# Patient Record
Sex: Female | Born: 1935 | Race: White | Hispanic: No | Marital: Married | Smoking: Former smoker
Health system: Southern US, Community
[De-identification: ages and names within clinical notes are randomized; demographics above are authoritative.]

## PROBLEM LIST (undated history)

## (undated) DIAGNOSIS — Z8679 Personal history of other diseases of the circulatory system: Secondary | ICD-10-CM

## (undated) DIAGNOSIS — I714 Abdominal aortic aneurysm, without rupture, unspecified: Secondary | ICD-10-CM

## (undated) DIAGNOSIS — R001 Bradycardia, unspecified: Secondary | ICD-10-CM

## (undated) DIAGNOSIS — F419 Anxiety disorder, unspecified: Secondary | ICD-10-CM

## (undated) DIAGNOSIS — H3530 Unspecified macular degeneration: Secondary | ICD-10-CM

## (undated) DIAGNOSIS — F0390 Unspecified dementia without behavioral disturbance: Secondary | ICD-10-CM

## (undated) DIAGNOSIS — K118 Other diseases of salivary glands: Secondary | ICD-10-CM

## (undated) DIAGNOSIS — G5139 Clonic hemifacial spasm, unspecified: Secondary | ICD-10-CM

## (undated) DIAGNOSIS — Z9889 Other specified postprocedural states: Secondary | ICD-10-CM

## (undated) DIAGNOSIS — R55 Syncope and collapse: Secondary | ICD-10-CM

## (undated) DIAGNOSIS — H548 Legal blindness, as defined in USA: Secondary | ICD-10-CM

## (undated) DIAGNOSIS — M47817 Spondylosis without myelopathy or radiculopathy, lumbosacral region: Secondary | ICD-10-CM

## (undated) DIAGNOSIS — E119 Type 2 diabetes mellitus without complications: Secondary | ICD-10-CM

## (undated) DIAGNOSIS — F32A Depression, unspecified: Secondary | ICD-10-CM

## (undated) DIAGNOSIS — I509 Heart failure, unspecified: Secondary | ICD-10-CM

## (undated) DIAGNOSIS — E039 Hypothyroidism, unspecified: Secondary | ICD-10-CM

## (undated) DIAGNOSIS — I82409 Acute embolism and thrombosis of unspecified deep veins of unspecified lower extremity: Secondary | ICD-10-CM

## (undated) DIAGNOSIS — I10 Essential (primary) hypertension: Secondary | ICD-10-CM

## (undated) DIAGNOSIS — R9430 Abnormal result of cardiovascular function study, unspecified: Secondary | ICD-10-CM

## (undated) DIAGNOSIS — F329 Major depressive disorder, single episode, unspecified: Secondary | ICD-10-CM

## (undated) DIAGNOSIS — F959 Tic disorder, unspecified: Secondary | ICD-10-CM

## (undated) DIAGNOSIS — IMO0002 Reserved for concepts with insufficient information to code with codable children: Secondary | ICD-10-CM

## (undated) DIAGNOSIS — K219 Gastro-esophageal reflux disease without esophagitis: Secondary | ICD-10-CM

## (undated) DIAGNOSIS — Z91041 Radiographic dye allergy status: Secondary | ICD-10-CM

## (undated) DIAGNOSIS — J449 Chronic obstructive pulmonary disease, unspecified: Secondary | ICD-10-CM

## (undated) HISTORY — PX: OOPHORECTOMY: SHX86

## (undated) HISTORY — DX: Personal history of other diseases of the circulatory system: Z86.79

## (undated) HISTORY — DX: Heart failure, unspecified: I50.9

## (undated) HISTORY — PX: BLADDER REPAIR: SHX76

## (undated) HISTORY — PX: KNEE CARTILAGE SURGERY: SHX688

## (undated) HISTORY — PX: CHOLECYSTECTOMY: SHX55

## (undated) HISTORY — DX: Type 2 diabetes mellitus without complications: E11.9

## (undated) HISTORY — PX: CATARACT EXTRACTION: SUR2

## (undated) HISTORY — DX: Other specified postprocedural states: Z98.890

## (undated) HISTORY — DX: Spondylosis without myelopathy or radiculopathy, lumbosacral region: M47.817

## (undated) HISTORY — DX: Legal blindness, as defined in USA: H54.8

## (undated) HISTORY — PX: APPENDECTOMY: SHX54

## (undated) HISTORY — DX: Bradycardia, unspecified: R00.1

## (undated) HISTORY — DX: Clonic hemifacial spasm, unspecified: G51.39

## (undated) HISTORY — PX: SINUS SURGERY WITH INSTATRAK: SHX5215

## (undated) HISTORY — DX: Unspecified macular degeneration: H35.30

---

## 2000-08-06 ENCOUNTER — Encounter: Admission: RE | Admit: 2000-08-06 | Discharge: 2000-08-06 | Payer: Self-pay | Admitting: Endocrinology

## 2000-08-06 ENCOUNTER — Encounter: Payer: Self-pay | Admitting: Endocrinology

## 2000-08-14 ENCOUNTER — Encounter: Admission: RE | Admit: 2000-08-14 | Discharge: 2000-08-14 | Payer: Self-pay | Admitting: Endocrinology

## 2000-08-14 ENCOUNTER — Encounter: Payer: Self-pay | Admitting: Endocrinology

## 2001-07-09 ENCOUNTER — Other Ambulatory Visit: Admission: RE | Admit: 2001-07-09 | Discharge: 2001-07-09 | Payer: Self-pay | Admitting: Endocrinology

## 2001-07-27 ENCOUNTER — Encounter: Admission: RE | Admit: 2001-07-27 | Discharge: 2001-07-27 | Payer: Self-pay | Admitting: Endocrinology

## 2001-07-27 ENCOUNTER — Encounter: Payer: Self-pay | Admitting: Endocrinology

## 2001-08-04 ENCOUNTER — Encounter (INDEPENDENT_AMBULATORY_CARE_PROVIDER_SITE_OTHER): Payer: Self-pay | Admitting: Specialist

## 2001-08-04 ENCOUNTER — Ambulatory Visit (HOSPITAL_COMMUNITY): Admission: RE | Admit: 2001-08-04 | Discharge: 2001-08-04 | Payer: Self-pay | Admitting: *Deleted

## 2002-07-19 ENCOUNTER — Encounter: Payer: Self-pay | Admitting: Endocrinology

## 2002-07-19 ENCOUNTER — Encounter: Admission: RE | Admit: 2002-07-19 | Discharge: 2002-07-19 | Payer: Self-pay | Admitting: Endocrinology

## 2002-10-12 ENCOUNTER — Other Ambulatory Visit: Admission: RE | Admit: 2002-10-12 | Discharge: 2002-10-12 | Payer: Self-pay | Admitting: Endocrinology

## 2003-04-26 ENCOUNTER — Other Ambulatory Visit: Admission: RE | Admit: 2003-04-26 | Discharge: 2003-04-26 | Payer: Self-pay | Admitting: *Deleted

## 2003-08-02 ENCOUNTER — Encounter: Admission: RE | Admit: 2003-08-02 | Discharge: 2003-08-02 | Payer: Self-pay | Admitting: Endocrinology

## 2003-08-02 ENCOUNTER — Encounter: Payer: Self-pay | Admitting: Endocrinology

## 2003-08-31 ENCOUNTER — Encounter (INDEPENDENT_AMBULATORY_CARE_PROVIDER_SITE_OTHER): Payer: Self-pay | Admitting: Specialist

## 2003-08-31 ENCOUNTER — Ambulatory Visit (HOSPITAL_COMMUNITY): Admission: RE | Admit: 2003-08-31 | Discharge: 2003-08-31 | Payer: Self-pay | Admitting: *Deleted

## 2003-11-07 ENCOUNTER — Other Ambulatory Visit: Admission: RE | Admit: 2003-11-07 | Discharge: 2003-11-07 | Payer: Self-pay | Admitting: Cardiology

## 2003-12-27 ENCOUNTER — Inpatient Hospital Stay (HOSPITAL_COMMUNITY): Admission: EM | Admit: 2003-12-27 | Discharge: 2003-12-28 | Payer: Self-pay | Admitting: Emergency Medicine

## 2004-01-02 ENCOUNTER — Emergency Department (HOSPITAL_COMMUNITY): Admission: EM | Admit: 2004-01-02 | Discharge: 2004-01-03 | Payer: Self-pay | Admitting: Emergency Medicine

## 2004-08-02 ENCOUNTER — Encounter: Admission: RE | Admit: 2004-08-02 | Discharge: 2004-08-02 | Payer: Self-pay | Admitting: Endocrinology

## 2004-08-10 ENCOUNTER — Encounter: Admission: RE | Admit: 2004-08-10 | Discharge: 2004-08-10 | Payer: Self-pay | Admitting: Endocrinology

## 2005-08-23 ENCOUNTER — Encounter: Admission: RE | Admit: 2005-08-23 | Discharge: 2005-08-23 | Payer: Self-pay | Admitting: Endocrinology

## 2005-09-06 ENCOUNTER — Encounter: Admission: RE | Admit: 2005-09-06 | Discharge: 2005-09-06 | Payer: Self-pay | Admitting: Endocrinology

## 2005-09-16 ENCOUNTER — Encounter (INDEPENDENT_AMBULATORY_CARE_PROVIDER_SITE_OTHER): Payer: Self-pay | Admitting: *Deleted

## 2005-09-16 ENCOUNTER — Ambulatory Visit (HOSPITAL_COMMUNITY): Admission: RE | Admit: 2005-09-16 | Discharge: 2005-09-16 | Payer: Self-pay | Admitting: *Deleted

## 2005-10-07 ENCOUNTER — Other Ambulatory Visit: Admission: RE | Admit: 2005-10-07 | Discharge: 2005-10-07 | Payer: Self-pay | Admitting: Internal Medicine

## 2005-11-08 ENCOUNTER — Emergency Department (HOSPITAL_COMMUNITY): Admission: EM | Admit: 2005-11-08 | Discharge: 2005-11-08 | Payer: Self-pay | Admitting: Emergency Medicine

## 2006-07-08 ENCOUNTER — Inpatient Hospital Stay (HOSPITAL_COMMUNITY): Admission: RE | Admit: 2006-07-08 | Discharge: 2006-07-10 | Payer: Self-pay | Admitting: General Surgery

## 2006-09-08 ENCOUNTER — Encounter: Admission: RE | Admit: 2006-09-08 | Discharge: 2006-09-08 | Payer: Self-pay | Admitting: Endocrinology

## 2006-11-13 ENCOUNTER — Other Ambulatory Visit: Admission: RE | Admit: 2006-11-13 | Discharge: 2006-11-13 | Payer: Self-pay | Admitting: Internal Medicine

## 2007-08-10 ENCOUNTER — Encounter: Admission: RE | Admit: 2007-08-10 | Discharge: 2007-08-10 | Payer: Self-pay | Admitting: Endocrinology

## 2007-09-10 ENCOUNTER — Encounter: Admission: RE | Admit: 2007-09-10 | Discharge: 2007-09-10 | Payer: Self-pay | Admitting: Endocrinology

## 2007-09-23 ENCOUNTER — Encounter (INDEPENDENT_AMBULATORY_CARE_PROVIDER_SITE_OTHER): Payer: Self-pay | Admitting: *Deleted

## 2007-09-23 ENCOUNTER — Ambulatory Visit (HOSPITAL_COMMUNITY): Admission: RE | Admit: 2007-09-23 | Discharge: 2007-09-23 | Payer: Self-pay | Admitting: *Deleted

## 2007-10-27 ENCOUNTER — Ambulatory Visit: Payer: Self-pay | Admitting: Vascular Surgery

## 2008-09-12 ENCOUNTER — Encounter: Admission: RE | Admit: 2008-09-12 | Discharge: 2008-09-12 | Payer: Self-pay | Admitting: Endocrinology

## 2008-10-17 ENCOUNTER — Ambulatory Visit: Payer: Self-pay | Admitting: Vascular Surgery

## 2008-11-24 ENCOUNTER — Encounter: Admission: RE | Admit: 2008-11-24 | Discharge: 2008-11-24 | Payer: Self-pay | Admitting: Endocrinology

## 2009-04-11 LAB — HM COLONOSCOPY

## 2009-09-28 ENCOUNTER — Encounter: Admission: RE | Admit: 2009-09-28 | Discharge: 2009-09-28 | Payer: Self-pay | Admitting: Endocrinology

## 2009-11-08 ENCOUNTER — Ambulatory Visit: Payer: Self-pay | Admitting: Vascular Surgery

## 2010-04-23 ENCOUNTER — Ambulatory Visit: Payer: Self-pay | Admitting: Vascular Surgery

## 2010-10-01 ENCOUNTER — Encounter: Admission: RE | Admit: 2010-10-01 | Discharge: 2010-10-01 | Payer: Self-pay | Admitting: Endocrinology

## 2010-10-16 ENCOUNTER — Encounter: Admission: RE | Admit: 2010-10-16 | Discharge: 2010-10-16 | Payer: Self-pay | Admitting: Endocrinology

## 2010-10-23 ENCOUNTER — Ambulatory Visit: Payer: Self-pay | Admitting: Vascular Surgery

## 2010-12-01 ENCOUNTER — Encounter: Payer: Self-pay | Admitting: Endocrinology

## 2010-12-02 ENCOUNTER — Encounter: Payer: Self-pay | Admitting: Endocrinology

## 2010-12-03 ENCOUNTER — Encounter: Payer: Self-pay | Admitting: Endocrinology

## 2011-03-26 NOTE — Procedures (Signed)
DUPLEX ULTRASOUND OF ABDOMINAL AORTA   INDICATION:  Followup of abdominal aortic aneurysm.   HISTORY:  Diabetes:  No.  Cardiac:  No.  Hypertension:  Yes.  Smoking:  Current.  Connective Tissue Disorder:  Family History:  No.  Previous Surgery:  No.   DUPLEX EXAM:         AP (cm)                   TRANSVERSE (cm)  Proximal             3.78 cm                   3.52 cm  Mid                  4.49 cm                   4.39 cm  Distal               2.51 cm                   2.20 cm  Right Iliac          1.74 cm                   2.08 cm  Left Iliac           1.29 cm                   1.44 cm   PREVIOUS:  Date:  10/17/2008  AP:  4  TRANSVERSE:  4.3   IMPRESSION:  Abdominal aortic aneurysm with an increase in size of 4.49  x 4.39 cm.   ___________________________________________  Quita Skye Hart [place], M.D.   CJ/MEDQ  D:  11/08/2009  T:  11/08/2009  Job:  [id number]

## 2011-03-26 NOTE — Op Note (Signed)
Paula [name], Paula [name]                 ACCOUNT NO.:  [id number]   MEDICAL RECORD NO.:  [id number]          PATIENT TYPE:  AMB   LOCATION:  ENDO                         FACILITY:  [facility]   PHYSICIAN:  Georgiana Spinner, M.D.    DATE OF BIRTH:  [birth year]   DATE OF PROCEDURE:  09/23/2007  DATE OF DISCHARGE:                               OPERATIVE REPORT   PROCEDURE:  Upper endoscopy.   INDICATIONS:  Abdominal discomfort.   ANESTHESIA:  Fentanyl 60 mcg, Versed 6 mg.   PROCEDURE:  With the patient mildly sedated in the left lateral  decubitus position, the Pentax videoscopic endoscope was inserted in the  mouth and passed under direct vision through the esophagus which  appeared normal.  There was a questionable area of Barrett's  photographed and biopsied.  We entered into the stomach.  Fundus, body,  antrum, duodenal bulb, second portion duodenum all appeared  unremarkable.  From this point the endoscope was slowly withdrawn taking  circumferential views of duodenal mucosa until the endoscope had been  pulled back into stomach placed in retroflexion to view the stomach from  below.  The endoscope was straightened and withdrawn taking  circumferential views remaining gastric and esophageal mucosa.   FINDINGS:  Question of Barrett's esophagus biopsied.  Otherwise  unremarkable examination other some atrophic changes of the gastric  fundus.   PLAN:  Await biopsy report.  The patient will call me for results and  follow-up with me as an outpatient as needed.           ______________________________  Georgiana Spinner, M.D.     GMO/MEDQ  D:  09/23/2007  T:  09/24/2007  Job:  [id number]

## 2011-03-26 NOTE — Op Note (Signed)
[name], [name]                 ACCOUNT NO.:  [id number]   MEDICAL RECORD NO.:  [id number]          PATIENT TYPE:  AMB   LOCATION:  ENDO                         FACILITY:  [facility]   PHYSICIAN:  Georgiana Spinner, M.D.    DATE OF BIRTH:  1927-06-30   DATE OF PROCEDURE:  DATE OF DISCHARGE:                               OPERATIVE REPORT   PROCEDURE:  Colonoscopy.   INDICATIONS:  Colon polyps.   ANESTHESIA:  Fentanyl 40 mcg and Versed 4 mg.   PROCEDURE:  With the patient mildly sedated in the left lateral  decubitus position, the Pentax videoscopic colonoscope was inserted into  the rectum and passed under direct vision to the cecum identified by  base of the cecum and ileocecal valve, both of which were photographed.  From this point, the colonoscope was slowly withdrawn, taking  circumferential views of colonic mucosa and stopping in the ascending  colon where a small polyp was seen, photographed and removed using hot  biopsy forceps technique at a setting of 20/150 blended current.  We  then withdrew all the way to the rectum which appeared normal on direct  and showed hemorrhoids on retroflexed view.  The endoscope was  straightened and withdrawn.  The patient's vital signs and pulse  oximeter remained stable.  The patient tolerated procedure well without  apparent complications.   FINDINGS:  Polyp of ascending colon and internal hemorrhoids, otherwise  unremarkable exam.   PLAN:  Await biopsy report.  The patient will call me for results and  cab followup with me as needed as an outpatient.           ______________________________  Georgiana Spinner, M.D.     GMO/MEDQ  D:  09/23/2007  T:  09/24/2007  Job:  [id number]

## 2011-03-26 NOTE — Procedures (Signed)
DUPLEX ULTRASOUND OF ABDOMINAL AORTA   INDICATION:  AAA followup.   HISTORY:  Diabetes:  Yes.  Cardiac:  No.  Hypertension:  Yes.  Smoking:  Yes.  Connective Tissue Disorder:  Family History:  No.  Previous Surgery:  No.   DUPLEX EXAM:         AP (cm)                   TRANSVERSE (cm)  Proximal             3.14 cm                   3.15 cm  Mid                  4.45 cm                   4.74 cm  Distal               3.99 cm                   3.99 cm  Right Iliac          Not visualized            Not visualized  Left Iliac           Not visualized            Not visualized   PREVIOUS:  Date: 04/23/2010  AP:  4.2  TRANSVERSE:  4.4   IMPRESSION:  1. Abdominal aortic aneurysm noted with the largest measurement of      4.45 cm X 4.74 cm.  2. Maximum diameter is slightly increased, as compared with the      previous study.   ___________________________________________  Quita Skye Hart [place], M.D.   EM/MEDQ  D:  10/23/2010  T:  10/23/2010  Job:  [id number]

## 2011-03-26 NOTE — Consult Note (Signed)
NEW PATIENT CONSULTATION   Paula [name], Paula [name]  DOB:  July 16, 1927                                       10/23/2010  CHART#:[id number]   Patient is an 77 year old patient with an abdominal aortic aneurysm  which has been followed with sequential ultrasounds.  Today she reports  no abdominal or back symptoms.  Her health has been relatively stable  over the last few years with the exception of a diagnosis of macular  degeneration.   CHRONIC MEDICAL PROBLEMS:  1. Degenerative joint disease.  2. Diabetes mellitus type 2.  3. Hypertension, treated medically.  4. Macular degeneration.  5. Negative for coronary artery disease or stroke.   SOCIAL HISTORY:  She is married, has 1 child.  Is retired.  She  continues to smoke a pack of cigarettes per day for 60+ years.  Does not  use alcohol.   FAMILY HISTORY:  Positive for coronary artery disease in her mother at  an elderly age.  Negative for diabetes and stroke.   REVIEW OF SYSTEMS:  Positive for macular degeneration arthritis,  unsteady gait.  No history of stroke.  Denies chest pain, dyspnea on  exertion, PND, orthopnea, chronic cough, or bronchitis.  All other  systems are negative in complete review of systems.   PHYSICAL EXAMINATION:  Blood pressure 131/76, heart rate 84,  respirations 14.  General:  She is a well-developed and well-nourished  elderly female in no apparent distress, alert and oriented x3.  She does  move slowly.  HEENT:  Normal for age.  EOMs intact.  Lungs:  Clear to  auscultation.  Some coarse rhonchi bilaterally.  Cardiovascular:  Regular rate and rhythm.  No murmurs.  Carotid pulses are 3+.  No bruits  audible.  Abdomen:  Soft, nontender.  There is a 4-5 cm pulsatile mass  in the mid epigastrium which is nontender.  Musculoskeletal:  Free of  major deformities.  Neurologic:  Normal.  Skin:  Free of rashes.  Lower  extremity exam reveals 3+ femoral, popliteal, and dorsalis pedis pulse  on the  right and a 3+ posterior tibial pulse on the left.   Today I ordered a duplex scan of her abdominal aorta which I have  reviewed and interpreted.  The aneurysm has enlarged slightly in the  last 6 months, now measuring a maximum of 4.74 in maximum diameter.   We have discussed this with her and will continue to follow this on a  regular basis.  She will return in 6 months for a follow-up duplex scan  of her aneurysm.     Quita Skye Hart [place], M.D.  Electronically Signed   JDL/MEDQ  D:  10/23/2010  T:  10/24/2010  Job:  [id number]   cc:   Georgianne Fick, M.D.

## 2011-03-26 NOTE — Procedures (Signed)
DUPLEX ULTRASOUND OF ABDOMINAL AORTA   INDICATION:  Follow-up evaluation of known abdominal aortic aneurysm.   HISTORY:  Diabetes:  No.  Cardiac:  No.  Hypertension:  Yes.  Smoking:  A pack per day.  Connective Tissue Disorder:  Family History:  No.  Previous Surgery:  No.   DUPLEX EXAM:         AP (cm)                   TRANSVERSE (cm)  Proximal             2.3 Cm                    2.2 cm  Mid                  3.9 cm                    4.0 cm  Distal               2.7 cm                    2.6 cm  Right Iliac          1.0 cm                    1.1 cm  Left Iliac           1.1 cm                    0.9 cm   PREVIOUS:  Date: 10/21/2006  AP:  3.7  TRANSVERSE:  4.0   IMPRESSION:  Abdominal aortic aneurysm is stable from previous study.   ___________________________________________  Quita Skye Hart [place], M.D.   MC/MEDQ  D:  10/27/2007  T:  10/28/2007  Job:  [id number]

## 2011-03-26 NOTE — Procedures (Signed)
DUPLEX ULTRASOUND OF ABDOMINAL AORTA   INDICATION:  Follow-up evaluation of known abdominal aortic aneurysm.   HISTORY:  Diabetes:  No.  Cardiac:  No.  Hypertension:  Yes.  Smoking:  Pack per day.  Connective Tissue Disorder:  Family History:  No.  Previous Surgery:  No.   DUPLEX EXAM:         AP (cm)                   TRANSVERSE (cm)  Proximal             2.5 cm                    2.6 cm  Mid                  4.0 cm                    4.3 cm  Distal               2.7 cm                    2.6 cm  Right Iliac          1.0 cm                    0.9 cm  Left Iliac           0.9 cm                    0.8 cm   PREVIOUS:  Date: 10/27/2007  AP:  3.9  TRANSVERSE:  4.0   IMPRESSION:  Abdominal aortic aneurysm measurements are stable compared  to previous study.   ___________________________________________  Quita Skye Hart [place], M.D.   MC/MEDQ  D:  10/17/2008  T:  10/17/2008  Job:  [id number]

## 2011-03-26 NOTE — Procedures (Signed)
DUPLEX ULTRASOUND OF ABDOMINAL AORTA   INDICATION:  Follow-up AAA.   HISTORY:  Diabetes:  Yes  Cardiac:  No  Hypertension:  Yes  Smoking:  Yes  Connective Tissue Disorder:  Family History:  No  Previous Surgery:  No   DUPLEX EXAM:         AP (cm)                   TRANSVERSE (cm)  Proximal             2.7 cm                    2.9 cm  Mid                  4.2 cm                    4.4 cm  Distal               3.0 cm                    2.4 cm  Right Iliac          1.2 cm                    1.2 cm  Left Iliac           1.3 cm                    1.4 cm   PREVIOUS:  Date:  AP:  4.4  TRANSVERSE:  4.3   IMPRESSION:  1. Abdominal aortic aneurysm with the largest diameter of 4.2 x 4.4      cm.  2. No significant change from previous studies.   ___________________________________________  Quita Skye Hart [place], M.D.   NT/MEDQ  D:  04/23/2010  T:  04/23/2010  Job:  [id number]

## 2011-03-29 NOTE — Op Note (Signed)
   NAME:  Paula [name], Paula [name]                           ACCOUNT NO.:  [id number]   MEDICAL RECORD NO.:  [id number]                   PATIENT TYPE:  AMB   LOCATION:  ENDO                                 FACILITY:  [facility]   PHYSICIAN:  Georgiana Spinner, M.D.                 DATE OF BIRTH:  Feb 28, 1927   DATE OF PROCEDURE:  DATE OF DISCHARGE:                                 OPERATIVE REPORT   PROCEDURE:  Colonoscopy.   INDICATIONS:  Colon polyps.   ANESTHESIA:  Demerol 30 mg, Versed 3 mg.   DESCRIPTION OF PROCEDURE:  With the patient mildly sedated and in the left  lateral decubitus position and subsequently rolled to her back, the Olympus  videoscopic colonoscope was inserted in the rectum and passed under direct  vision to the cecum, identified by ileocecal valve and appendiceal orifice,  both of which were photographed.  From this point,  the colonoscope was  slowly withdrawn , taking circumferential views of the colonic mucosa as we  withdrew all the way to the rectum, stopping only in the ascending colon  where two polyps were seen adjacent to each other.  Both were photographed  and both were removed using hot biopsy forceps technique, setting of 20/20  blended current and placed in the same container labeled ascending colon.  Endoscope was then, as noted, withdrawn all the way to the rectum, taking  circumferential views of the colonic mucosa, stopping then only in the  rectum which appeared normal  on direct and showed a hemorrhoid on  retroflexed view.  The endoscope was straightened and withdrawn.  The  patient's vital signs and pulse oximetry remained stable.  The patient  tolerated the procedure well without apparent complications.   FINDINGS:  1. Polyps of the ascending colon, removed.  2. Internal hemorrhoid.  3. Otherwise unremarkable exam.   PLAN:  Await biopsy report.  The patient will call me for results and follow  up with me as an outpatient.                         Georgiana Spinner, M.D.    GMO/MEDQ  D:  08/31/2003  T:  08/31/2003  Job:  [id number]

## 2011-03-29 NOTE — Op Note (Signed)
NAMEGRAELYN, BIHL                 ACCOUNT NO.:  [id number]   MEDICAL RECORD NO.:  [id number]          PATIENT TYPE:  INP   LOCATION:  0002                         FACILITY:  [facility]   PHYSICIAN:  Angelia Mould. Derrell Lolling, M.D.DATE OF BIRTH:  [birth year]   DATE OF PROCEDURE:  07/08/2006  DATE OF DISCHARGE:                                 OPERATIVE REPORT   PREOPERATIVE DIAGNOSIS:  Incarcerated ventral hernia.   POSTOPERATIVE DIAGNOSIS:  Multiple incarcerated ventral herniae.   OPERATION PERFORMED:  Laparoscopic lysis of adhesions and laparoscopic  repair of incarcerated ventral hernia with Parietex composite mesh (24 cm x  18 cm mesh).   SURGEON:  Angelia Mould. Derrell Lolling, M.D.   ASSISTANT:  Lorne Skeens. Hoxworth, M.D.   OPERATIVE INDICATIONS:  This is a 77 year old white female who has had  ovarian cystectomy many years ago through a lower midline incision, an open  cholecystectomy through a right subcostal incision many years ago, and has a  known abdominal aortic aneurysm that is being followed by Dr. Hart [place].  She  felt a lump in her lower abdomen slightly to the right of midline since she  had her bladder suspension and umbilical hernia repair in 1997 and it has  gotten larger with some discomfort.  On exam, she has well healed scars and  just to the right of the midline scar below the umbilicus, there is a mostly  but not completely reducible ventral hernia.  There was no inguinal hernia  noted.  It was her decision to have this repaired electively.  She is  brought to the operating room electively.   OPERATIVE FINDINGS:  The patient had about a 3.5 to 4 cm ventral hernia  defect in the lower midline to the right of midline about 4 cm below the  umbilicus with incarcerated omentum.  There was also a smaller hernia,  perhaps 2.5 cm in diameter, immediately above the symphysis pubis with  incarcerated omentum.  I did not see any other herniae.  There is no sign of  any other pathology.   There were extensive adhesions all along the midline  from just below the falciform ligament all the way down to the symphysis  pubis which required about 45 minutes to take down.   OPERATIVE TECHNIQUE:  Following the induction of general endotracheal  anesthesia, a Foley catheter was inserted.  The abdomen was prepped and  draped in a sterile fashion.  Intravenous antibiotics were given immediately  preop.  0.5% Marcaine was used as a local infiltration anesthetic.  In the  left lateral abdomen, a transverse incision was made about 2 cm in length.  Dissection was carried down to the external oblique fascia which was incised  transversely.  We then retracted the internal oblique muscles and lifted up  on the peritoneum and made a small incision into the abdominal cavity under  direct vision.  There were no adhesions beneath that incision.  A  pursestring suture of 0 Vicryl was placed in the external oblique and  internal oblique muscle layers.  A 10 mm Hassan trocar was inserted and  then  the pursestring sutures tied and pneumoperitoneum created.  The video camera  was inserted.  We ultimately placed two 5 mm trocars in the left lower  quadrant and two 5 mm trocars in the right mid abdomen.  Using blunt  dissection and sharp dissection with the harmonic scalpel, we took down all  the adhesions from just below the falciform ligament all the way down to the  symphysis pubis.  We had to manually reduce some of the omentum from the two  hernia defects.  At the level of the symphysis pubis, we were very careful  to define the edges of the defect and it was just above the symphysis pubis.   After all this was done, we checked for bleeding and found nothing actively  bleeding.  Using a spinal needle through the abdominal wall, we marked the  edges of the herniae and decided that we needed to repair the entire lower  midline incision with mesh which ultimately turned out to 24 cm vertically x  18  cm transversely.  We brought a large piece of Parietex composite mesh on  the operating field and trimmed it slightly at the corners.  We then marked  it with a marking pen as a template to the abdominal wall.  On the left side  the mesh, we placed eight equidistant sutures of 0 Novofil.  We then  moistened the mesh, rolled it up, and inserted it into the abdominal cavity,  and then redeployed it and oriented it to the intended templated repair.  We  placed puncture wounds at eight equidistant points around the perimeter of  the intended repair and brought the Novofil sutures up through the abdominal  wall with an Endoclose device trying to get 1 cm bite of fascia.  After all  of these were placed, it was lifted up and looked like there was going to be  excellent coverage everywhere.  We tied all of the sutures.  At the most  inferior aspect of the incision, we left about a 3 cm length of mesh to  drape down below the symphysis pubis.  We then used a 5-mm tacker and tacked  the mesh around the perimeter all the way around with each tack being no  more than 1 cm apart and we placed several 5 mm tacks centrally around the  mesh to the fix it to the abdominal wall.   We inspected the repair, it looked good, there did not seem to be any gaps  in the mesh.  There did not seem to be any bleeding.  The trocars were  removed under direct vision.  There was no bleeding from trocar sites.  The  pneumoperitoneum was released.  The fascia at the left lateral trocar site  was closed with 0 Vicryl sutures.  The skin incisions were closed with skin  staples.  Clean bandages were placed and the patient taken recovery room in  stable condition.  Estimated blood loss was about 40 mL.  Complications  none.  Sponge and instrument counts were correct.      Angelia Mould. Derrell Lolling, M.D.  Electronically Signed     HMI/MEDQ  D:  07/08/2006  T:  07/08/2006  Job:  [id number]

## 2011-03-29 NOTE — Op Note (Signed)
   NAME:  Paula [name], Paula [name]                           ACCOUNT NO.:  [id number]   MEDICAL RECORD NO.:  [id number]                   PATIENT TYPE:  AMB   LOCATION:  ENDO                                 FACILITY:  [facility]   PHYSICIAN:  Georgiana Spinner, M.D.                 DATE OF BIRTH:  [birth year]   DATE OF PROCEDURE:  08/31/2003  DATE OF DISCHARGE:                                 OPERATIVE REPORT   PROCEDURE:  Endoscopy.   INDICATIONS:  GERD.   ANESTHESIA:  Demerol 70 mg, Versed 7 mg.   DESCRIPTION OF PROCEDURE:  With the patient mildly sedated in the left  lateral decubitus position, the Olympus videoscopic endoscope inserted in  the mouth, passed under direct vision through the esophagus, which appeared  normal until we reached the distal esophagus, and there was a question of  Barrett's, photographed and biopsied.  We entered into the stomach.  Fundus,  body, antrum, duodenal bulb, second portion of the duodenum were visualized.  From this point the endoscope was slowly withdrawn, taking circumferential  views of the duodenal mucosa until the endoscope had been pulled back into  the stomach, placed in retroflexion to view the stomach from below, and a  hiatal hernia was photographed from below.  The endoscope was straightened  and withdrawn, taking circumferential views of the remaining gastric and  esophageal mucosa, stopping to biopsy changes of erythema in the antrum and  body of the stomach.  The patient's vital signs and pulse oximetry remained  stable.  The patient tolerated the procedure well without apparent  complications.   FINDINGS:  1. Erythema of body and fundus of stomach, biopsied.  2. Question of Barrett's esophagus, biopsied.  3. Hiatal hernia noted.   PLAN:  Await biopsy report.  The patient will call me for results and follow  up with me as an outpatient.  Proceed to colonoscopy as planned.                                                 Georgiana Spinner,  M.D.    GMO/MEDQ  D:  08/31/2003  T:  08/31/2003  Job:  [id number]

## 2011-03-29 NOTE — Procedures (Signed)
[facility]  Patient:    Paula [name], Paula [name] Visit Number: [id number] MRN: [id number]          Service Type: END Location: ENDO Attending Physician:  Sabino Gasser Dictated by:   Sabino Gasser, M.D. Proc. Date: 08/04/01 Admit Date:  08/04/2001                             Procedure Report  PROCEDURE:  Colonoscopy.  INDICATIONS:  Colon polyps.  ANESTHESIA:  Demerol 30, Versed 3 mg.  DESCRIPTION OF PROCEDURE:  With the patient mildly sedated in the left lateral decubitus position, the Olympus videoscopic colonoscope was inserted in the rectum and passed under direct vision to the cecum identified by the ileocecal valve and appendiceal orifice, both of which were photographed.  From this point the colonoscope was slowly withdrawn, taking circumferential views of the entire colonic mucosa, stopping many places mostly in the right colon, first at the ascending colon and then hepatic flexure and what appeared to be proximal transverse and possibly to maybe mid or even distal transverse colon.  It is hard to tell from landmarks but multiple small polyps were seen throughout the colon.  All of them were removed using hot biopsy forceps technique, all with same 20/20 blended current.  We pulled back the endoscope all the way down to the rectum as mentioned which appeared normal on direct view and showed a large internal hemorrhoid on retroflex view.  The endoscope was straightened and then withdrawn.  The patients vital signs and pulse oximetry were unstable.  The patient tolerated the procedure well without apparent complications.  FINDINGS:  Multiple polyps scattered throughout the colon, tortuosity of the left colon.  Await biopsy report.  The patient will call me for results and follow up with me as an outpatient. Avoid NSAIDs for two weeks. Dictated by:   Sabino Gasser, M.D. Attending Physician:  Sabino Gasser DD:  08/04/01 TD:  08/04/01 Job:  [id number]

## 2011-03-29 NOTE — H&P (Signed)
NAME:  Paula [name], Paula [name]                           ACCOUNT NO.:  [id number]   MEDICAL RECORD NO.:  [id number]                   PATIENT TYPE:  EMS   LOCATION:  MAJO                                 FACILITY:  [facility]   PHYSICIAN:  Jonna L. Robb Matar, M.D.            DATE OF BIRTH:  [birth year]   DATE OF ADMISSION:  12/27/2003  DATE OF DISCHARGE:                                HISTORY & PHYSICAL   PRIMARY CARE PHYSICIAN:  Dr. Brooke Bonito.   CHIEF COMPLAINT:  Trouble breathing.   HISTORY:  This 77 year old white female had an episode of dizziness and  suspicion of a possible TIA on December 11, 2003.  The patient had a carotid  ultrasound ordered, was started on Plavix and was to have an MRI and MRA.  The patient went for the tests today and towards the end of the MRA, began  to develop hoarseness, tightness in her chest, breathing difficulty and  swelling.  The tests were stopped abruptly and she was given 75 mg of  Benadryl p.o.  There was some question if she had had a berry aneurysm in  her initial CT on December 12, 2003.  The patient got hypotensive down to a  blood pressure of 60, developed hives, almost lost consciousness and  gradually improved.  She was brought to the emergency room, where she was  treated with Benadryl 25 mg IV, Solu-Medrol 125 mg IV and Pepcid 20 mg IV.   PAST MEDICAL HISTORY:  The patient has had problems with:  1. Hypertension.  2. Hypothyroidism.  3. Abdominal aortic aneurysm; this was measured by Dr. Quita Skye. Lawson at     4.3 cm about 6 months ago.  4. Depression.  5. GERD.   ALLERGIES:  PENICILLIN give her hives.  SULFA gave her pruritus.   OPERATIONS:  She has had:  1. A cholecystectomy.  2. Ruptured ovarian cyst.  3. Umbilical hernia.  4. Bladder tack.  5. Sinus surgery.  6. A cyst on her left cheek.   FAMILY HISTORY:  Family history is positive for a sister that died of colon  cancer.   SOCIAL HISTORY:  She is retired, has children.   MEDICATIONS:  1. Synthroid 0.15 mg.  2. Xanax 0.25 mg t.i.d.  3. Wellbutrin 300 mg at bedtime.  4. Trilafon.  5. Prempro 0.625 mg.  6. Aciphex 20 mg.  7. Hydrochlorothiazide 25 mg.   HABITS:  She is a nonsmoker, nondrinker.   REVIEW OF SYSTEMS:  Patient complaining of a headache, but no weakness or  pain.  She is still hoarse.  She denies visual problems.  She still has a  little bit of tightness with her breathing, some cough, no overt wheezing at  this time.  No previous history of asthma or pneumonia.  No history or heart  disease.  She has had colon polyps taken out in the past, had a  fairly  recent colonoscopy and EGD and only 1 polyp was removed at this time.  She  denies any kidney problems or incontinence.  No breast disease.  She has  diet-controlled diabetes.  She has had thyroid disease for about 10 or 11  years.  She has been on Prempro but not clear if it was for any reason other  than menopause.  She has no other underlying skin problems.  She does  complain of diffuse arthritis, at one point had an MRI in her back which she  was told was okay.  No history of stroke or memory problems.  She has had  some depression problems; she has been prescribed Trilafon.   PHYSICAL EXAMINATION:  VITAL SIGNS:  Pulse 110, respirations 22, temperature  97.1, 167/80.  GENERAL:  The patient is an overweight white female with definitely still  some angioedema on her face.  Her upper lip is a little swollen and below  the chin is swollen.  She usually has a double chin but it is not as  prominent as it is now.  HEENT:  She has normal conjunctivae and lids.  Pupils are reactive to light.  EOMs are full.  There is normal hearing.  The pharynx is still a little bit  edematous.  The voice is noticeably hoarse.  LUNGS:  Respiratory effort is slightly increased.  The lungs were clear to  A&P without wheezing, rales or rhonchi.  There is no dullness.  HEART:  The heart is tachycardic,  especially with exercise, regular rate and  rhythm, normal S1 and S2 without murmurs, rubs, or gallops.  No bruits.  No  cyanosis, clubbing or edema.  BREASTS:  Breasts show no masses, tenderness or induration.  ABDOMEN:  The abdomen is nontender, normal bowel sounds.  No  hepatosplenomegaly.  She has several surgical scars.  EXTERNAL GENITALIA:  External genitalia is mature.  LYMPHATICS:  There is no cervical or inguinal adenopathy.  EXTREMITIES:  Muscle strength is 5/5, all 4 extremities.  She has  osteoarthritic changes, full range of motion.  NEUROLOGIC:  No loss of sensation.  Patient is alert and oriented x3, normal  memory and judgment.  She is a little bit anxious.   ADMISSION LABORATORY DATA:  EKG shows sinus tachycardia but no ischemic  changes.   Chest x-ray is pending.   IMPRESSION:  1. Anaphylaxis and angioedema secondary to dye.  She will be treated with     Solu-Medrol and Benadryl especially, along with the H2 blocker.  We     should avoid using proton pump inhibitors because they have been     associated with angioedema as well.  2. Hypertension.  She will continue her present medications.  3. Hypothyroidism.  Continue Synthroid.  She said she had her level checked     recently and it was okay.  4. Abdominal aortic aneurysm.  This should be checked every year.  5. Depression.  6. Gastroesophageal reflux disease.  7. I will also put her on some sliding-scale insulin since she will be on     steroids and that may vary.                                                Jonna L. Robb Matar, M.D.    Dorna Bloom  D:  12/27/2003  T:  12/27/2003  Job:  [id number]   cc:   Brooke Bonito, M.D.  [address]  [place]  [place] [address]  Fax: [phone]

## 2011-03-29 NOTE — Op Note (Signed)
Paula [name], Paula [name]                 ACCOUNT NO.:  [id number]   MEDICAL RECORD NO.:  [id number]          PATIENT TYPE:  AMB   LOCATION:  ENDO                         FACILITY:  [facility]   PHYSICIAN:  Georgiana Spinner, M.D.    DATE OF BIRTH:  Dec 31, 1926   DATE OF PROCEDURE:  09/16/2005  DATE OF DISCHARGE:                                 OPERATIVE REPORT   PROCEDURE:  Colonoscopy.   INDICATIONS:  Colon polyp.   ANESTHESIA:  Demerol 40, Versed 6 mg,   PROCEDURE:  The patient mildly sedated in the left lateral decubitus  position, the Olympus videoscopic colonoscope was inserted in the rectum and  passed under direct vision to the cecum, identified by ileocecal valve and  appendiceal orifice, both of which were photographed.  From this point, the  colonoscope was slowly withdrawn, taking circumferential views of the  colonic mucosa, stopping at the hepatic flexure where a polyp was seen and  removed using snare cautery technique setting of 20/200 blended current.  The tissue was retrieved by suctioning it into the endoscope.  The endoscope  was then further withdrawn, taking circumferential views of the colonic  mucosa, stopping next only in the rectum which appeared normal on direct and  showed hemorrhoids on retroflexed view.  The endoscope was straightened and  withdrawn.  The patient's vital signs, pulse oximeter remained stable.  The  patient tolerated the procedure well without apparent complication.   FINDINGS:  Internal hemorrhoids, polyp at hepatic flexure, await biopsy  report.  The patient will call me for results and follow-up with me as an  outpatient.           ______________________________  Georgiana Spinner, M.D.     GMO/MEDQ  D:  09/16/2005  T:  09/16/2005  Job:  [id number]

## 2011-03-29 NOTE — Op Note (Signed)
Paula [name], Paula [name]                 ACCOUNT NO.:  [id number]   MEDICAL RECORD NO.:  [id number]          PATIENT TYPE:  AMB   LOCATION:  ENDO                         FACILITY:  [facility]   PHYSICIAN:  Georgiana Spinner, M.D.    DATE OF BIRTH:  [birth year]   DATE OF PROCEDURE:  09/16/2005  DATE OF DISCHARGE:                                 OPERATIVE REPORT   PROCEDURE:  Upper endoscopy.   INDICATIONS:  GERD.   ANESTHESIA:  Demerol 60, Versed 7 mg.   PROCEDURE:  With the patient mildly sedated in the left lateral decubitus  position, the Olympus videoscopic endoscope was inserted in the mouth,  passed under direct vision through the esophagus which appeared normal  except for a possible area of Barrett's, photographed and biopsied.  We  entered into the stomach.  Fundus, body, antrum, duodenal bulb, second  portion duodenum were visualized.  From this point, the endoscope was slowly  withdrawn taking circumferential views of duodenal mucosa until the  endoscope had been pulled back into the stomach, placed in retroflexion to  view the stomach from below.  The endoscope was straightened and withdrawn  taking circumferential views of remaining gastric and esophageal mucosa.  The patient's vital signs, pulse oximeter remained stable.  The patient  tolerated the procedure well without apparent complications.   FINDINGS:  Question of Barrett's esophagus, biopsied.  Await biopsy report.  The patient will call me for results and follow-up with me as an outpatient.  Proceed to colonoscopy as planned.           ______________________________  Georgiana Spinner, M.D.     GMO/MEDQ  D:  09/16/2005  T:  09/16/2005  Job:  [id number]

## 2011-03-29 NOTE — Procedures (Signed)
[facility]  Patient:    Paula [name], Paula [name] Visit Number: [id number] MRN: [id number]          Service Type: END Location: ENDO Attending Physician:  Sabino Gasser Dictated by:   Sabino Gasser, M.D. Admit Date:  08/04/2001                             Procedure Report  PROCEDURE:  Upper endoscopy.  INDICATIONS:   GERD.  ANESTHESIA:  Demerol 80, Versed 8.  DESCRIPTION OF PROCEDURE:  With the patient mildly sedated in the left lateral decubitus position, the Olympus videoscopic endoscope was inserted in the mouth and passed under direct vision through the esophagus.  There was a question of short-segment Barretts esophagus.  We photographed as best we could, biopsied it.  Entered into the stomach.  Fundus, body, antrum, duodenal bulb, second portion of the duodenum all appeared normal.  From this point the endoscope was slowly withdrawn, taking circumferential views of the entire duodenal mucosa to the endoscope and pulled back in the stomach, placed in retroflexion.   Viewed the stomach from below and hiatal hernia was seen and photographed.  The endoscope was then straightened and withdrawn taking circumferentially views of the entire gastric and subsequently the esophageal mucosa which otherwise appeared normal.  The patients vital signs and pulse oximetry remained stable.  The patient tolerated the procedure well without apparent complications.  FINDINGS:  Question of short-segment Barretts esophagus, biopsied.  Await biopsy report. The patient will call me for results and follow up with me as an outpatient. Dictated by:   Sabino Gasser, M.D. Attending Physician:  Sabino Gasser DD:  08/04/01 TD:  08/04/01 Job: [id number]

## 2011-03-29 NOTE — Discharge Summary (Signed)
Paula Paula [name], Paula Paula [name]                 ACCOUNT NO.:  [id number]   MEDICAL RECORD NO.:  [id number]          PATIENT TYPE:  INP   LOCATION:  1332                         FACILITY:  [facility]   PHYSICIAN:  Angelia Mould. Derrell Lolling, M.D.DATE OF BIRTH:  [birth year]   DATE OF ADMISSION:  07/08/2006  DATE OF DISCHARGE:  07/10/2006                                 DISCHARGE SUMMARY   FINAL DIAGNOSES:  1. Multiple incarcerated ventral incisional hernias.  2. Hypertension.  3. Status post open cholecystectomy.  4. Status post umbilical hernia repair and bladder suspension.  5. Abdominal aortic aneurysm, asymptomatic, stable.   OPERATION PERFORMED:  Laparoscopic repair of multiple incarcerated ventral  hernias with Parietex composite mesh.   HISTORY:  This is Paula [name] who complains of Paula [name] right lower  quadrant abdominal wall mass.  She states she felt Paula [name] in her right lower  quadrant after she had Paula [name] and umbilical hernia repair in  1997.  She says it has gotten larger.  These were felt to be hernias and  elective surgery was scheduled.   PHYSICAL EXAMINATION:  Significant physical findings limited to the abdomen  which was slightly obese, soft and nontender.  Well-healed right subcostal  scar, no hernia there.  Lower midline scar.  Mostly reducible hernia  probably coming from the midline and projecting to the right Paula [name] bit.  I do  not feel an inguinal or femoral hernia.   HOSPITAL COURSE:  On the day of admission, the patient was taken to the  operating room and underwent diagnostic laparoscopy, laparoscopic lysis of  adhesions and laparoscopic repair of her ventral incisional hernias. I found  Paula [name] lower midline hernia and midline hernia near the symphysis pubis and some  weakness elsewhere in the lower midline.  For this reason, I placed Paula [name] large  sheet of mesh measuring 24 cm x 18 cm to cover all of the hernia defects and  weak area.  This mesh placement and repair was  uneventful. Postoperatively  the patient did well.  She progressed in her diet and activities without any  complication and was discharged on July 10, 2006, the second postoperative  day.  At that time she was tolerating her diet, voiding well, was ambulatory  and pain was pretty well controlled.  Her wounds looked fine.  She was given  Paula [name].  She was advised to continue her usual  medications which include Lipitor 10 mg Paula [name] day, Perphenazine 2 mg every other  day,  hydrochlorothiazide 25 mg Paula [name] day, Aciphex 20 Paula [name] day, Synthroid 150 mcg Paula [name] day,  budeprion 1300 mg daily, Prempro 0.625/2.5 mg daily, and hyoscyamine 0.125  mg every four hours as needed.  I will see her back in the office in one to  two weeks.      Angelia Mould. Derrell Lolling, M.D.  Electronically Signed     HMI/MEDQ  D:  08/06/2006  T:  08/08/2006  Job:  [id number]   cc:   Brooke Bonito, M.D.  Fax: [phone]   Quita Skye. Hart [place], M.D.  [address]  [place]  [place] [address]

## 2011-03-29 NOTE — Discharge Summary (Signed)
NAME:  Paula [name], Paula [name]                           ACCOUNT NO.:  [id number]   MEDICAL RECORD NO.:  [id number]                   PATIENT TYPE:  INP   LOCATION:  3714                                 FACILITY:  [facility]   PHYSICIAN:  Jonna L. Robb Matar, M.D.            DATE OF BIRTH:  10-Sep-1927   DATE OF ADMISSION:  12/27/2003  DATE OF DISCHARGE:  12/28/2003                                 DISCHARGE SUMMARY   PRIMARY CARE PHYSICIAN:  Dr. Brooke Bonito.   FINAL DIAGNOSES:  1. Anaphylaxis/angioedema secondary to dye.  2. Transient ischemic attack.  3. Chronic obstructive pulmonary disease.  4. Hypertension.  5. Gastroesophageal reflux disease.  6. Abdominal aortic aneurysm.   CONSULTANTS:  None.   OPERATIONS:  None.   ALLERGIES:  PENICILLIN, SULFA and DYE.   CODE STATUS:  Full.   HISTORY:  This 77 year old white female was being evaluated for an abnormal  CT on December 12, 2003 that showed a possible berry aneurysm.  The workup  was being done because the patient was complaining of episodes of dizziness.  Within a couple of minutes of getting the dye for the MRA, the patient got  hypotensive, broke out in hives, had hoarseness, decreased mental status,  chest tightness, her blood pressure dropped to 60 systolic.  She received 75  mg  Benadryl in the [facility]; in the emergency room, she  received Benadryl 25 mg IV, Solu-Medrol 125 mg IV and Pepcid 20 mg IV.   PHYSICAL EXAMINATION:  Physical exam on admission was notable for a  normalized blood pressure of 167/80, a hoarse voice, an overt angioedema but  swelling of the upper lip, the periorbital area, the entire neck.  The lungs  were clear with no wheezing and the patient was oriented.   EKG shows sinus tachycardia.   The chest x-ray showed some early COPD.   DISPOSITION:  The patient is being discharged on:  1. Prednisone 10 mg daily. for 2 days.  2. Zantac 300 mg b.i.d. in place of the Aciphex because PPIs have been     occasionally associated with angioedema.  3. She has been told to resume all of her previous medications which include     Synthroid, Xanax, Wellbutrin, Trilafon, Prempro and hydrochlorothiazide.   She has been told to quit smoking, since she had the emphysema on chest x-  rays.   Also suggest she have her sugars evaluated.  She apparently has had some  borderline readings and went up to 239 when on the Solu-Medrol.                                                Jonna L. Robb Matar, M.D.    Dorna Bloom  D:  12/28/2003  T:  12/28/2003  Job:  [id number]   cc:   Brooke Bonito, M.D.  [address]  [place]  [place] [address]  Fax: [phone]

## 2011-05-10 ENCOUNTER — Encounter (INDEPENDENT_AMBULATORY_CARE_PROVIDER_SITE_OTHER): Payer: Medicare Other

## 2011-05-10 DIAGNOSIS — I714 Abdominal aortic aneurysm, without rupture: Secondary | ICD-10-CM

## 2011-05-13 ENCOUNTER — Other Ambulatory Visit: Payer: Self-pay | Admitting: Internal Medicine

## 2011-05-13 ENCOUNTER — Other Ambulatory Visit: Payer: PRIVATE HEALTH INSURANCE

## 2011-05-13 ENCOUNTER — Ambulatory Visit
Admission: RE | Admit: 2011-05-13 | Discharge: 2011-05-13 | Disposition: A | Discharge status: Home / Self Care | Payer: Medicare Other | Source: Ambulatory Visit | Attending: Internal Medicine | Admitting: Internal Medicine

## 2011-05-13 DIAGNOSIS — R609 Edema, unspecified: Secondary | ICD-10-CM

## 2011-05-20 NOTE — Procedures (Unsigned)
DUPLEX ULTRASOUND OF ABDOMINAL AORTA  INDICATION:  Abdominal aortic aneurysm.  HISTORY: Diabetes:  Yes. Cardiac:  No. Hypertension:  Yes. Smoking:  Yes. Connective Tissue Disorder:  No. Family History:  No. Previous Surgery:  DUPLEX EXAM:         AP (cm)                   TRANSVERSE (cm) Proximal             2.5 cm                    2.7 cm Mid                  2.6 cm                    3.0 cm Distal               4.8 cm                    4.6 cm Right Iliac          cm                        cm Left Iliac           cm                        cm  PREVIOUS:  Date:  10/23/2010  AP:  4.45  TRANSVERSE:  4.74  IMPRESSION: 1. Aneurysmal dilatation of the mid to distal abdominal aorta with no     significant change in maximum diameter when compared to the     previous exam, however, this exam could not adequately rule out  a     larger sac size due to acoustic shadowing. 2. The bilateral common iliac arteries were not adequately visualized     due to overlying bowel gas patterns.  ___________________________________________ Quita Skye Hart [place], M.D.  CH/MEDQ  D:  05/13/2011  T:  05/13/2011  Job:  [id number]

## 2011-08-04 ENCOUNTER — Ambulatory Visit (INDEPENDENT_AMBULATORY_CARE_PROVIDER_SITE_OTHER): Payer: Medicare Other

## 2011-08-04 ENCOUNTER — Inpatient Hospital Stay (INDEPENDENT_AMBULATORY_CARE_PROVIDER_SITE_OTHER)
Admission: RE | Admit: 2011-08-04 | Discharge: 2011-08-04 | Disposition: A | Discharge status: Home / Self Care | Payer: Medicare Other | Source: Ambulatory Visit | Attending: Family Medicine | Admitting: Family Medicine

## 2011-08-04 DIAGNOSIS — R071 Chest pain on breathing: Secondary | ICD-10-CM

## 2011-08-12 ENCOUNTER — Inpatient Hospital Stay (HOSPITAL_COMMUNITY)
Admission: RE | Admit: 2011-08-12 | Discharge: 2011-08-12 | Disposition: A | Discharge status: Home / Self Care | Payer: Medicare Other | Source: Ambulatory Visit | Attending: Family Medicine | Admitting: Family Medicine

## 2011-09-12 ENCOUNTER — Other Ambulatory Visit: Payer: Self-pay | Admitting: Internal Medicine

## 2011-09-12 DIAGNOSIS — Z1231 Encounter for screening mammogram for malignant neoplasm of breast: Secondary | ICD-10-CM

## 2011-09-12 HISTORY — PX: PACEMAKER INSERTION: SHX728

## 2011-09-15 ENCOUNTER — Other Ambulatory Visit: Payer: Self-pay

## 2011-09-15 ENCOUNTER — Emergency Department (HOSPITAL_COMMUNITY): Payer: Medicare Other

## 2011-09-15 ENCOUNTER — Inpatient Hospital Stay (HOSPITAL_COMMUNITY)
Admission: EM | Admit: 2011-09-15 | Discharge: 2011-09-24 | DRG: 193 | Disposition: A | Discharge status: Home Health | Payer: Medicare Other | Attending: Internal Medicine | Admitting: Internal Medicine

## 2011-09-15 DIAGNOSIS — E1151 Type 2 diabetes mellitus with diabetic peripheral angiopathy without gangrene: Secondary | ICD-10-CM | POA: Diagnosis present

## 2011-09-15 DIAGNOSIS — N390 Urinary tract infection, site not specified: Secondary | ICD-10-CM | POA: Diagnosis present

## 2011-09-15 DIAGNOSIS — K118 Other diseases of salivary glands: Secondary | ICD-10-CM | POA: Diagnosis present

## 2011-09-15 DIAGNOSIS — E039 Hypothyroidism, unspecified: Secondary | ICD-10-CM | POA: Diagnosis present

## 2011-09-15 DIAGNOSIS — F329 Major depressive disorder, single episode, unspecified: Secondary | ICD-10-CM | POA: Diagnosis present

## 2011-09-15 DIAGNOSIS — J439 Emphysema, unspecified: Secondary | ICD-10-CM | POA: Diagnosis present

## 2011-09-15 DIAGNOSIS — R531 Weakness: Secondary | ICD-10-CM

## 2011-09-15 DIAGNOSIS — J189 Pneumonia, unspecified organism: Principal | ICD-10-CM | POA: Diagnosis present

## 2011-09-15 DIAGNOSIS — D72829 Elevated white blood cell count, unspecified: Secondary | ICD-10-CM | POA: Diagnosis present

## 2011-09-15 DIAGNOSIS — K5900 Constipation, unspecified: Secondary | ICD-10-CM | POA: Diagnosis not present

## 2011-09-15 DIAGNOSIS — I10 Essential (primary) hypertension: Secondary | ICD-10-CM | POA: Diagnosis present

## 2011-09-15 DIAGNOSIS — R9431 Abnormal electrocardiogram [ECG] [EKG]: Secondary | ICD-10-CM | POA: Diagnosis not present

## 2011-09-15 DIAGNOSIS — E876 Hypokalemia: Secondary | ICD-10-CM | POA: Diagnosis present

## 2011-09-15 DIAGNOSIS — R220 Localized swelling, mass and lump, head: Secondary | ICD-10-CM | POA: Diagnosis present

## 2011-09-15 DIAGNOSIS — R42 Dizziness and giddiness: Secondary | ICD-10-CM

## 2011-09-15 DIAGNOSIS — Z79899 Other long term (current) drug therapy: Secondary | ICD-10-CM

## 2011-09-15 DIAGNOSIS — I495 Sick sinus syndrome: Secondary | ICD-10-CM | POA: Diagnosis not present

## 2011-09-15 DIAGNOSIS — R221 Localized swelling, mass and lump, neck: Secondary | ICD-10-CM | POA: Diagnosis present

## 2011-09-15 DIAGNOSIS — K658 Other peritonitis: Secondary | ICD-10-CM | POA: Diagnosis present

## 2011-09-15 DIAGNOSIS — J438 Other emphysema: Secondary | ICD-10-CM | POA: Diagnosis present

## 2011-09-15 DIAGNOSIS — I714 Abdominal aortic aneurysm, without rupture, unspecified: Secondary | ICD-10-CM | POA: Diagnosis present

## 2011-09-15 DIAGNOSIS — R4789 Other speech disturbances: Secondary | ICD-10-CM | POA: Diagnosis present

## 2011-09-15 DIAGNOSIS — E119 Type 2 diabetes mellitus without complications: Secondary | ICD-10-CM

## 2011-09-15 DIAGNOSIS — R001 Bradycardia, unspecified: Secondary | ICD-10-CM

## 2011-09-15 DIAGNOSIS — F3289 Other specified depressive episodes: Secondary | ICD-10-CM | POA: Diagnosis present

## 2011-09-15 DIAGNOSIS — F0390 Unspecified dementia without behavioral disturbance: Secondary | ICD-10-CM | POA: Diagnosis present

## 2011-09-15 DIAGNOSIS — I442 Atrioventricular block, complete: Secondary | ICD-10-CM

## 2011-09-15 DIAGNOSIS — R410 Disorientation, unspecified: Secondary | ICD-10-CM

## 2011-09-15 HISTORY — DX: Essential (primary) hypertension: I10

## 2011-09-15 HISTORY — DX: Hypothyroidism, unspecified: E03.9

## 2011-09-15 HISTORY — DX: Depression, unspecified: F32.A

## 2011-09-15 HISTORY — DX: Major depressive disorder, single episode, unspecified: F32.9

## 2011-09-15 HISTORY — DX: Chronic obstructive pulmonary disease, unspecified: J44.9

## 2011-09-15 LAB — COMPREHENSIVE METABOLIC PANEL
ALT: 16 U/L (ref 0–35)
CO2: 27 mEq/L (ref 19–32)
Calcium: 10.3 mg/dL (ref 8.4–10.5)
Chloride: 101 mEq/L (ref 96–112)
GFR calc Af Amer: 88 mL/min — ABNORMAL LOW (ref >90)
GFR calc non Af Amer: 76 mL/min — ABNORMAL LOW (ref >90)
Glucose, Bld: 135 mg/dL — ABNORMAL HIGH (ref 70–99)
Sodium: 140 mEq/L (ref 135–145)
Total Bilirubin: 0.3 mg/dL (ref 0.3–1.2)

## 2011-09-15 LAB — DIFFERENTIAL
Eosinophils Absolute: 0.8 10*3/uL — ABNORMAL HIGH (ref 0.0–0.7)
Eosinophils Relative: 8 % — ABNORMAL HIGH (ref 0–5)
Lymphs Abs: 3.1 10*3/uL (ref 0.7–4.0)
Monocytes Relative: 12 % (ref 3–12)

## 2011-09-15 LAB — URINALYSIS, ROUTINE W REFLEX MICROSCOPIC
Glucose, UA: 100 mg/dL — AB
Hgb urine dipstick: NEGATIVE
Ketones, ur: NEGATIVE mg/dL
Protein, ur: NEGATIVE mg/dL
pH: 6 (ref 5.0–8.0)

## 2011-09-15 LAB — CBC
HCT: 36 % (ref 36.0–46.0)
Hemoglobin: 12.1 g/dL (ref 12.0–15.0)
MCH: 34.1 pg — ABNORMAL HIGH (ref 26.0–34.0)
MCV: 101.4 fL — ABNORMAL HIGH (ref 78.0–100.0)
RBC: 3.55 MIL/uL — ABNORMAL LOW (ref 3.87–5.11)

## 2011-09-15 LAB — URINE MICROSCOPIC-ADD ON

## 2011-09-15 LAB — GLUCOSE, CAPILLARY: Glucose-Capillary: 139 mg/dL — ABNORMAL HIGH (ref 70–99)

## 2011-09-15 MED ORDER — ONDANSETRON HCL 4 MG/2ML IJ SOLN
4.0000 mg | Freq: Once | INTRAMUSCULAR | Status: AC
  Administered 2011-09-15: 4 mg via INTRAVENOUS
  Filled 2011-09-15: qty 2

## 2011-09-15 MED ORDER — LORAZEPAM 1 MG PO TABS
2.0000 mg | ORAL_TABLET | ORAL | Status: AC
  Administered 2011-09-15: 2 mg via ORAL
  Filled 2011-09-15: qty 2

## 2011-09-15 MED ORDER — POTASSIUM CHLORIDE CRYS ER 20 MEQ PO TBCR
40.0000 meq | EXTENDED_RELEASE_TABLET | Freq: Once | ORAL | Status: AC
  Administered 2011-09-15: 40 meq via ORAL
  Filled 2011-09-15: qty 2

## 2011-09-15 MED ORDER — CIPROFLOXACIN IN D5W 200 MG/100ML IV SOLN
200.0000 mg | Freq: Once | INTRAVENOUS | Status: AC
  Administered 2011-09-15: 200 mg via INTRAVENOUS
  Filled 2011-09-15: qty 100

## 2011-09-15 MED ORDER — AZITHROMYCIN 500 MG IV SOLR
500.0000 mg | Freq: Once | INTRAVENOUS | Status: AC
  Administered 2011-09-15: 500 mg via INTRAVENOUS
  Filled 2011-09-15: qty 500

## 2011-09-15 MED ORDER — AZITHROMYCIN 500 MG IV SOLR
INTRAVENOUS | Status: AC
  Filled 2011-09-15: qty 500

## 2011-09-15 MED ORDER — SODIUM CHLORIDE 0.9 % IV SOLN
Freq: Once | INTRAVENOUS | Status: AC
  Administered 2011-09-15: 18:00:00 via INTRAVENOUS

## 2011-09-15 MED ORDER — DEXTROSE 5 % IV SOLN
INTRAVENOUS | Status: AC
  Filled 2011-09-15: qty 250

## 2011-09-15 NOTE — ED Notes (Signed)
Pt reports with her episodes of weak and numb feels, she becomes very nauseated

## 2011-09-15 NOTE — ED Provider Notes (Signed)
  Physical Exam  BP 131/70  Pulse 106  Temp(Src) 99 F (37.2 C) (Oral)  Ht 5\' 5"  (1.651 m)  Wt 165 lb (74.844 kg)  BMI 27.46 kg/m2  SpO2 95%  Physical Exam  ED Course  Procedures  EKG:  Rhythm: sinus tach Rate: 105 Axis: normal Intervals: 1st degree AV block. Early r wave progression ST segments: Nonspecific changes ST segs   MDM Pt seen and assessed. Agree with assessment and plan. Abx for uti and possible pneumonia.  Medical screening examination/treatment/procedure(s) were conducted as a shared visit with non-physician practitioner(s) and myself.  I personally evaluated the patient during the encounter.      Raeford Razor, MD 09/19/11 [phone]

## 2011-09-15 NOTE — ED Notes (Signed)
Pt transferred to CT.

## 2011-09-16 ENCOUNTER — Encounter (HOSPITAL_COMMUNITY): Payer: Self-pay | Admitting: Internal Medicine

## 2011-09-16 DIAGNOSIS — R42 Dizziness and giddiness: Secondary | ICD-10-CM

## 2011-09-16 LAB — CREATININE, SERUM
GFR calc Af Amer: 89 mL/min — ABNORMAL LOW (ref >90)
GFR calc non Af Amer: 77 mL/min — ABNORMAL LOW (ref >90)

## 2011-09-16 LAB — BASIC METABOLIC PANEL
CO2: 27 mEq/L (ref 19–32)
Chloride: 105 mEq/L (ref 96–112)
GFR calc Af Amer: 90 mL/min — ABNORMAL LOW (ref >90)
Potassium: 3.7 mEq/L (ref 3.5–5.1)
Sodium: 141 mEq/L (ref 135–145)

## 2011-09-16 LAB — GLUCOSE, CAPILLARY
Glucose-Capillary: 115 mg/dL — ABNORMAL HIGH (ref 70–99)
Glucose-Capillary: 185 mg/dL — ABNORMAL HIGH (ref 70–99)
Glucose-Capillary: 163 mg/dL — ABNORMAL HIGH (ref 70–99)
Glucose-Capillary: 133 mg/dL — ABNORMAL HIGH (ref 70–99)

## 2011-09-16 LAB — CBC
HCT: 32.6 % — ABNORMAL LOW (ref 36.0–46.0)
HCT: 34.8 % — ABNORMAL LOW (ref 36.0–46.0)
Hemoglobin: 10.6 g/dL — ABNORMAL LOW (ref 12.0–15.0)
Hemoglobin: 11.7 g/dL — ABNORMAL LOW (ref 12.0–15.0)
MCV: 101.9 fL — ABNORMAL HIGH (ref 78.0–100.0)
MCV: 100.6 fL — ABNORMAL HIGH (ref 78.0–100.0)
RBC: 3.46 MIL/uL — ABNORMAL LOW (ref 3.87–5.11)
RDW: 13.7 % (ref 11.5–15.5)
WBC: 7.4 10*3/uL (ref 4.0–10.5)
WBC: 8.7 10*3/uL (ref 4.0–10.5)

## 2011-09-16 MED ORDER — ALBUTEROL SULFATE (5 MG/ML) 0.5% IN NEBU
2.5000 mg | INHALATION_SOLUTION | Freq: Four times a day (QID) | RESPIRATORY_TRACT | Status: DC
Start: 2011-09-16 — End: 2011-09-16
  Administered 2011-09-16 (×2): 2.5 mg via RESPIRATORY_TRACT
  Filled 2011-09-16 (×6): qty 0.5

## 2011-09-16 MED ORDER — BUPROPION HCL 100 MG PO TABS
100.0000 mg | ORAL_TABLET | Freq: Every day | ORAL | Status: DC
  Administered 2011-09-16 – 2011-09-24 (×9): 100 mg via ORAL
  Filled 2011-09-16 (×9): qty 1

## 2011-09-16 MED ORDER — GUAIFENESIN ER 600 MG PO TB12
600.0000 mg | ORAL_TABLET | Freq: Two times a day (BID) | ORAL | Status: DC
  Administered 2011-09-16 – 2011-09-20 (×7): 600 mg via ORAL
  Filled 2011-09-16 (×12): qty 1

## 2011-09-16 MED ORDER — PERPHENAZINE 2 MG PO TABS
2.0000 mg | ORAL_TABLET | ORAL | Status: DC
  Administered 2011-09-16 – 2011-09-18 (×2): 2 mg via ORAL
  Filled 2011-09-16 (×3): qty 1

## 2011-09-16 MED ORDER — ACETAMINOPHEN 650 MG RE SUPP: 650.0000 mg | Freq: Four times a day (QID) | RECTAL | Status: DC | PRN

## 2011-09-16 MED ORDER — IPRATROPIUM BROMIDE 0.02 % IN SOLN
0.5000 mg | Freq: Four times a day (QID) | RESPIRATORY_TRACT | Status: DC
  Administered 2011-09-16 (×2): 0.5 mg via RESPIRATORY_TRACT
  Filled 2011-09-16 (×6): qty 2.5

## 2011-09-16 MED ORDER — LEVOTHYROXINE SODIUM 100 MCG PO TABS
100.0000 ug | ORAL_TABLET | Freq: Every day | ORAL | Status: DC
  Administered 2011-09-16 – 2011-09-24 (×9): 100 ug via ORAL
  Filled 2011-09-16 (×9): qty 1

## 2011-09-16 MED ORDER — ALPRAZOLAM 0.25 MG PO TABS
0.2500 mg | ORAL_TABLET | Freq: Every day | ORAL | Status: DC
  Administered 2011-09-16 – 2011-09-23 (×5): 0.25 mg via ORAL
  Filled 2011-09-16 (×6): qty 1

## 2011-09-16 MED ORDER — SODIUM CHLORIDE 0.9 % IV SOLN
INTRAVENOUS | Status: DC
  Administered 2011-09-16 – 2011-09-18 (×2): via INTRAVENOUS

## 2011-09-16 MED ORDER — CALCIUM CARBONATE 600 MG PO TABS: 600.0000 mg | ORAL_TABLET | Freq: Every day | ORAL | Status: DC

## 2011-09-16 MED ORDER — LUTEIN 10 MG PO TABS: 1.0000 | ORAL_TABLET | Freq: Every day | ORAL | Status: DC

## 2011-09-16 MED ORDER — HYDROCODONE-ACETAMINOPHEN 5-325 MG PO TABS
1.0000 | ORAL_TABLET | ORAL | Status: DC | PRN
  Administered 2011-09-16: 1 via ORAL
  Administered 2011-09-16: 2 via ORAL
  Filled 2011-09-16: qty 1
  Filled 2011-09-16: qty 2

## 2011-09-16 MED ORDER — HYDROCHLOROTHIAZIDE 25 MG PO TABS
25.0000 mg | ORAL_TABLET | Freq: Every day | ORAL | Status: DC
  Administered 2011-09-16 – 2011-09-20 (×5): 25 mg via ORAL
  Filled 2011-09-16 (×6): qty 1

## 2011-09-16 MED ORDER — SENNA 8.6 MG PO TABS
2.0000 | ORAL_TABLET | Freq: Every day | ORAL | Status: DC | PRN
  Administered 2011-09-19: 17.2 mg via ORAL
  Filled 2011-09-16: qty 2

## 2011-09-16 MED ORDER — CALCIUM CARBONATE 1250 (500 CA) MG PO TABS
1.0000 | ORAL_TABLET | Freq: Every day | ORAL | Status: DC
  Administered 2011-09-16 – 2011-09-24 (×8): 500 mg via ORAL
  Filled 2011-09-16 (×9): qty 1

## 2011-09-16 MED ORDER — IPRATROPIUM BROMIDE 0.02 % IN SOLN
0.5000 mg | Freq: Four times a day (QID) | RESPIRATORY_TRACT | Status: DC | PRN
  Administered 2011-09-17: 0.5 mg via RESPIRATORY_TRACT
  Filled 2011-09-16: qty 2.5

## 2011-09-16 MED ORDER — AMITRIPTYLINE HCL 10 MG PO TABS
10.0000 mg | ORAL_TABLET | Freq: Every day | ORAL | Status: DC
  Administered 2011-09-16 – 2011-09-17 (×2): 10 mg via ORAL
  Filled 2011-09-16 (×3): qty 1

## 2011-09-16 MED ORDER — ONDANSETRON HCL 4 MG PO TABS: 4.0000 mg | ORAL_TABLET | Freq: Four times a day (QID) | ORAL | Status: DC | PRN

## 2011-09-16 MED ORDER — ONDANSETRON HCL 4 MG/2ML IJ SOLN
4.0000 mg | Freq: Four times a day (QID) | INTRAMUSCULAR | Status: DC | PRN
  Administered 2011-09-16 – 2011-09-20 (×3): 4 mg via INTRAVENOUS
  Filled 2011-09-16 (×4): qty 2

## 2011-09-16 MED ORDER — ENOXAPARIN SODIUM 40 MG/0.4ML ~~LOC~~ SOLN
40.0000 mg | SUBCUTANEOUS | Status: DC
  Administered 2011-09-16 – 2011-09-18 (×3): 40 mg via SUBCUTANEOUS
  Filled 2011-09-16 (×4): qty 0.4

## 2011-09-16 MED ORDER — DEXTROSE 5 % IV SOLN
500.0000 mg | Freq: Every day | INTRAVENOUS | Status: DC
  Filled 2011-09-16: qty 500

## 2011-09-16 MED ORDER — VITAMIN D3 25 MCG (1000 UNIT) PO TABS
1000.0000 [IU] | ORAL_TABLET | Freq: Every day | ORAL | Status: DC
  Administered 2011-09-16 – 2011-09-24 (×6): 1000 [IU] via ORAL
  Filled 2011-09-16 (×10): qty 1

## 2011-09-16 MED ORDER — DOCUSATE SODIUM 100 MG PO CAPS
100.0000 mg | ORAL_CAPSULE | Freq: Two times a day (BID) | ORAL | Status: DC
  Administered 2011-09-16 – 2011-09-22 (×11): 100 mg via ORAL
  Filled 2011-09-16 (×19): qty 1

## 2011-09-16 MED ORDER — ACETAMINOPHEN 325 MG PO TABS: 650.0000 mg | ORAL_TABLET | Freq: Four times a day (QID) | ORAL | Status: DC | PRN

## 2011-09-16 MED ORDER — LEVOFLOXACIN IN D5W 750 MG/150ML IV SOLN
750.0000 mg | INTRAVENOUS | Status: DC
  Administered 2011-09-16 – 2011-09-17 (×2): 750 mg via INTRAVENOUS
  Filled 2011-09-16 (×3): qty 150

## 2011-09-16 MED ORDER — ALBUTEROL SULFATE (5 MG/ML) 0.5% IN NEBU
2.5000 mg | INHALATION_SOLUTION | Freq: Four times a day (QID) | RESPIRATORY_TRACT | Status: DC | PRN
  Administered 2011-09-17 – 2011-09-21 (×3): 2.5 mg via RESPIRATORY_TRACT
  Filled 2011-09-16 (×3): qty 0.5

## 2011-09-16 MED ORDER — ALBUTEROL SULFATE (5 MG/ML) 0.5% IN NEBU: 2.5000 mg | INHALATION_SOLUTION | RESPIRATORY_TRACT | Status: DC | PRN

## 2011-09-16 MED ORDER — CIPROFLOXACIN IN D5W 400 MG/200ML IV SOLN
400.0000 mg | Freq: Two times a day (BID) | INTRAVENOUS | Status: DC
  Administered 2011-09-16: 400 mg via INTRAVENOUS
  Filled 2011-09-16 (×2): qty 200

## 2011-09-16 NOTE — Progress Notes (Signed)
*  PRELIMINARY REPORT Carotid dopplers performed. Preliminary findings showed no ICA stenosis, antegrade vertebral artery flow bilaterally.  Vinetta Bergamo 09/16/2011, 6:42 PM

## 2011-09-16 NOTE — H&P (Signed)
Paula [name] is an 77 y.o. female.   Chief Complaint: Weakness HPI:  77 y/o cauc female who who notice increase weakness and unsteadiness over the last 3 days. She also c/o left arm tingling over this time period. She state that the left arm tingling comes and goes and has been going on for several years. She denies any nausea,vomiting fever or chills. She denies any chest pain ,palpitation , Headache, or focal weakness. She does c/o of Dyspnea on exertion.  Past Medical History  Diagnosis Date  . Diabetes mellitus   . Thyroid disease   . Abdominal aneurysm   . COPD (chronic obstructive pulmonary disease)   . Hypertension     Past Surgical History  Procedure Date  . Cholecystectomy   . Joint replacement   . Sinus surgery with instatrak   . Appendectomy   . Oophorectomy   . Arthroscopic repair acl     Family History  Problem Relation Age of Onset  . Cancer Father    Social History:  reports that she quit smoking about 2 weeks ago. She does not have any smokeless tobacco history on file. She reports that she does not drink alcohol or use illicit drugs.  Allergies:  Allergies  Allergen Reactions  . Sulfa Antibiotics Anaphylaxis  . Contrast Media (Iodinated Diagnostic Agents) Swelling  . Iohexol      Desc: PT STATES SHE HAD A SEVERE REACTION TO IV CONRAST WITH THROAT SWELLING AND SOB. SHE WAS ADMITTED TO THE HOSPITAL. SHE HAS NEVER HAD CONTRAST AGAIN.   Marland Kitchen Penicillins Rash    Medications Prior to Admission  Medication Dose Route Frequency Provider Last Rate Last Dose  . 0.9 %  sodium chloride infusion   Intravenous Once [employer], PA 100 mL/hr at 09/15/11 1800    . azithromycin (ZITHROMAX) 500 mg in dextrose 5 % 250 mL IVPB  500 mg Intravenous Once [employer], PA   500 mg at 09/15/11 2327  . azithromycin (ZITHROMAX) 500 MG injection           . ciprofloxacin (CIPRO) IVPB 200 mg  200 mg Intravenous Once [employer], PA   200 mg at 09/15/11 2236  .  dextrose 5 % solution           . LORazepam (ATIVAN) tablet 2 mg  2 mg Oral STAT Jennifer A Pitylak, PA   2 mg at 09/15/11 2236  . ondansetron (ZOFRAN) injection 4 mg  4 mg Intravenous Once [employer], PA   4 mg at 09/15/11 1800  . potassium chloride SA (K-DUR,KLOR-CON) CR tablet 40 mEq  40 mEq Oral Once [employer], PA   40 mEq at 09/15/11 2101   No current outpatient prescriptions on file as of 09/15/2011.    Results for orders placed during the hospital encounter of 09/15/11 (from the past 48 hour(s))  URINALYSIS, ROUTINE W REFLEX MICROSCOPIC     Status: Abnormal   Collection Time   09/15/11  5:46 PM      Component Value Range Comment   Color, Urine YELLOW  YELLOW     Appearance CLOUDY (*) CLEAR     Specific Gravity, Urine 1.023  1.005 - 1.030     pH 6.0  5.0 - 8.0     Glucose, UA 100 (*) NEGATIVE (mg/dL)    Hgb urine dipstick NEGATIVE  NEGATIVE     Bilirubin Urine NEGATIVE  NEGATIVE     Ketones, ur NEGATIVE  NEGATIVE (  mg/dL)    Protein, ur NEGATIVE  NEGATIVE (mg/dL)    Urobilinogen, UA 0.2  0.0 - 1.0 (mg/dL)    Nitrite NEGATIVE  NEGATIVE     Leukocytes, UA MODERATE (*) NEGATIVE    URINE MICROSCOPIC-ADD ON     Status: Normal   Collection Time   09/15/11  5:46 PM      Component Value Range Comment   Squamous Epithelial / LPF RARE  RARE     WBC, UA 11-20  <3 (WBC/hpf)    Bacteria, UA RARE  RARE     Urine-Other MUCOUS PRESENT     CBC     Status: Abnormal   Collection Time   09/15/11  6:00 PM      Component Value Range Comment   WBC 10.6 (*) 4.0 - 10.5 (K/uL)    RBC 3.55 (*) 3.87 - 5.11 (MIL/uL)    Hemoglobin 12.1  12.0 - 15.0 (g/dL)    HCT 16.1  09.6 - 04.5 (%)    MCV 101.4 (*) 78.0 - 100.0 (fL)    MCH 34.1 (*) 26.0 - 34.0 (pg)    MCHC 33.6  30.0 - 36.0 (g/dL)    RDW 40.9  81.1 - 91.4 (%)    Platelets 310  150 - 400 (K/uL)   DIFFERENTIAL     Status: Abnormal   Collection Time   09/15/11  6:00 PM      Component Value Range Comment   Neutrophils Relative 51   43 - 77 (%)    Neutro Abs 5.4  1.7 - 7.7 (K/uL)    Lymphocytes Relative 29  12 - 46 (%)    Lymphs Abs 3.1  0.7 - 4.0 (K/uL)    Monocytes Relative 12  3 - 12 (%)    Monocytes Absolute 1.2 (*) 0.1 - 1.0 (K/uL)    Eosinophils Relative 8 (*) 0 - 5 (%)    Eosinophils Absolute 0.8 (*) 0.0 - 0.7 (K/uL)    Basophils Relative 1  0 - 1 (%)    Basophils Absolute 0.1  0.0 - 0.1 (K/uL)   COMPREHENSIVE METABOLIC PANEL     Status: Abnormal   Collection Time   09/15/11  6:00 PM      Component Value Range Comment   Sodium 140  135 - 145 (mEq/L)    Potassium 3.2 (*) 3.5 - 5.1 (mEq/L)    Chloride 101  96 - 112 (mEq/L)    CO2 27  19 - 32 (mEq/L)    Glucose, Bld 135 (*) 70 - 99 (mg/dL)    BUN 13  6 - 23 (mg/dL)    Creatinine, Ser 7.82  0.50 - 1.10 (mg/dL)    Calcium 95.6  8.4 - 10.5 (mg/dL)    Total Protein 6.6  6.0 - 8.3 (g/dL)    Albumin 3.6  3.5 - 5.2 (g/dL)    AST 16  0 - 37 (U/L)    ALT 16  0 - 35 (U/L)    Alkaline Phosphatase 63  39 - 117 (U/L)    Total Bilirubin 0.3  0.3 - 1.2 (mg/dL)    GFR calc non Af Amer 76 (*) >90 (mL/min)    GFR calc Af Amer 88 (*) >90 (mL/min)   LIPASE, BLOOD     Status: Abnormal   Collection Time   09/15/11  6:00 PM      Component Value Range Comment   Lipase 60 (*) 11 - 59 (U/L)   TROPONIN I  Status: Normal   Collection Time   09/15/11  6:00 PM      Component Value Range Comment   Troponin I <0.30  <0.30 (ng/mL)   GLUCOSE, CAPILLARY     Status: Abnormal   Collection Time   09/15/11  7:51 PM      Component Value Range Comment   Glucose-Capillary 139 (*) 70 - 99 (mg/dL)    Dg Chest 2 View  96/0/4540  *RADIOLOGY REPORT*  Clinical Data: Lies weakness  CHEST - 2 VIEW  Comparison: 08/04/2011  Findings: Mild cardiomegaly.  Left midlung calcified granuloma. There are ill-defined opacities at the left base worrisome for patchy airspace disease.  Lungs are otherwise clear.  Pulmonary vascularity is within normal limits.  IMPRESSION: Patchy opacities at the left base  worrisome for airspace disease. Follow-up radiographs until resolution are recommended.  Original Report Authenticated By: Donavan Burnet, M.D.   Ct Head Wo Contrast  09/15/2011  *RADIOLOGY REPORT*  Clinical Data: Numbness.  Lightheadedness.  CT HEAD WITHOUT CONTRAST  Technique:  Contiguous axial images were obtained from the base of the skull through the vertex without contrast.  Comparison: None.  Findings: Global atrophy.  Chronic ischemic changes in the periventricular white matter.  No mass effect, midline shift, or acute intracranial hemorrhage.  Mastoid air cells are clear. Visualized paranasal sinuses are clear.  Cranium is intact.  IMPRESSION: No acute intracranial pathology.  Chronic changes.  Original Report Authenticated By: Donavan Burnet, M.D.    Review of Systems  Constitutional: Positive for malaise/fatigue.  HENT: Negative.   Eyes: Negative.   Respiratory: Negative for cough, hemoptysis, sputum production, shortness of breath and wheezing.   Cardiovascular: Negative.   Gastrointestinal: Negative.   Genitourinary: Negative.   Musculoskeletal: Negative.   Skin: Negative.   Neurological: Positive for tingling and weakness.  Endo/Heme/Allergies: Negative.   Psychiatric/Behavioral: Negative.     Blood pressure 149/90, pulse 114, temperature 98.1 F (36.7 C), temperature source Oral, height 5\' 5"  (1.651 m), weight 74.844 kg (165 lb), SpO2 96.00%. Physical Exam  Constitutional: She is oriented to person, place, and time. She appears well-developed and well-nourished. No distress.  HENT:  Head: Normocephalic and atraumatic.  Right Ear: External ear normal.  Left Ear: External ear normal.  Nose: Nose normal.  Mouth/Throat: Oropharynx is clear and moist. No oropharyngeal exudate.  Eyes: Conjunctivae and EOM are normal. Pupils are equal, round, and reactive to light.  Neck: Normal range of motion. Neck supple. No JVD present. No tracheal deviation present. No thyromegaly  present.  Cardiovascular: Normal rate, regular rhythm, normal heart sounds and intact distal pulses.  Exam reveals no gallop and no friction rub.   No murmur heard. Respiratory: Effort normal and breath sounds normal. No stridor. No respiratory distress. She has no wheezes. She has no rales. She exhibits no tenderness.  GI: Soft. Bowel sounds are normal. She exhibits no distension and no mass. There is no tenderness. There is no guarding.  Musculoskeletal: Normal range of motion. She exhibits no edema and no tenderness.  Lymphadenopathy:    She has no cervical adenopathy.  Neurological: She is alert and oriented to person, place, and time. She has normal reflexes. She displays normal reflexes. No cranial nerve deficit. She exhibits normal muscle tone. Coordination normal.  Skin: Skin is warm and dry. She is not diaphoretic.  Psychiatric: She has a normal mood and affect. Her behavior is normal. Judgment and thought content normal.     Assessment/Plan Community acquired Pneumonia  UTI Generalized weakness  Plan; Admit to regular medical bed          [facility] Team 2          IV antibiotics          IV hydration          Mucolytic's          Nebulizer treatments   Chapman Matteucci 09/16/2011, 12:04 AM

## 2011-09-16 NOTE — Progress Notes (Signed)
PHARMACIST - PHYSICIAN COMMUNICATION DR: Shawnie Dapper CONCERNING: P&T Medication Policy on Herbal Medications  DESCRIPTION:  This patient's order for:  Lutein  has been noted. This product(s) is classified as an "herbal" or natural product. Due to a lack of definitive safety studies or FDA approval, nonstandard manufacturing practices, plus the potential risk of unknown drug-drug interactions while on inpatient medications, the Pharmacy and Therapeutics Committee does not permit the use of "herbal" or natural products of this type within [facility].   RECOMMENDATION: The pharmacy department has rejected this order at this time and your patient has been informed of this safety policy. Please reevaluate patient's clinical condition at discharge and address if the herbal or natural product(s) should be resumed at that time. Eddie Candle 09/16/2011

## 2011-09-16 NOTE — Progress Notes (Signed)
Subjective: Patient states that she feels better today than she did yesterday. She also has an additional complaint of dizziness when she turns her head. The patient also states that she has instability in her gait which has been present for several months and appears to be worsening. The patient also states that the dizziness comes nausea and episodes of emesis which she has experienced recently. The patient also has some chronic peritonitis which is followed by Dr. Pollyann Kennedy. She states that there is no change in his condition. Objective: Filed Vitals:   09/16/11 0145 09/16/11 0354 09/16/11 0841 09/16/11 1500  BP: 137/87   147/81  Pulse: 98   96  Temp: 98.2 F (36.8 C)   98.2 F (36.8 C)  TempSrc: Oral   Oral  Resp: 18   18  Height: 5\' 6"  (1.676 m)     Weight: 74.6 kg (164 lb 7.4 oz)     SpO2: 92% 98% 98% 93%   Weight change:   Intake/Output Summary (Last 24 hours) at 09/16/11 1634 Last data filed at 09/16/11 1500  Gross per 24 hour  Intake   1350 ml  Output   1001 ml  Net    349 ml    General: Alert, awake, oriented x3, in no acute distress.  HEENT: [place]/AT PEERL, EOMI Neck: Trachea midline.Tthe patient has a right parotid fullness which is nontender to palpation, no thyromegal,y no JVD, no carotid bruit OROPHARYNX:  Moist, No exudate/ erythema/lesions.  Heart: Regular rate and rhythm, without murmurs, rubs, gallops, PMI non-displaced, no heaves or thrills on palpation.  Lungs: Clear to auscultation, no wheezing or rhonchi noted. No increased vocal fremitus resonant to percussion  Abdomen: Soft, nontender, nondistended, positive bowel sounds, no masses no hepatosplenomegaly noted..  Neuro: Patient has equal strength bilaterally in the upper and lower extremities. Strength is about 3+ to 4 -/5. She also has a positive Romberg in standing.  Musculoskeletal: No warm swelling or erythema around joints, no spinal tenderness noted. Psychiatric: Patient alert and oriented x3, good insight  and cognition, good recent to remote recall. Lymph node survey: No cervical axillary or inguinal lymphadenopathy noted.     Lab Results:  Basename 09/16/11 1105 09/16/11 0440 09/15/11 1800  NA 141 -- 140  K 3.7 -- 3.2*  CL 105 -- 101  CO2 27 -- 27  GLUCOSE 111* -- 135*  BUN 9 -- 13  CREATININE 0.73 0.75 --  CALCIUM 9.6 -- 10.3  MG -- -- --  PHOS -- -- --    Basename 09/15/11 1800  AST 16  ALT 16  ALKPHOS 63  BILITOT 0.3  PROT 6.6  ALBUMIN 3.6    Basename 09/15/11 1800  LIPASE 60*  AMYLASE --    Basename 09/16/11 1105 09/16/11 0440 09/15/11 1800  WBC 8.7 7.4 --  NEUTROABS -- -- 5.4  HGB 11.7* 10.6* --  HCT 34.8* 32.6* --  MCV 100.6* 101.9* --  PLT 274 284 --    Basename 09/15/11 1800  CKTOTAL --  CKMB --  CKMBINDEX --  TROPONINI <0.30   No results found for this basename: POCBNP:3 in the last 72 hours No results found for this basename: DDIMER:2 in the last 72 hours No results found for this basename: HGBA1C:2 in the last 72 hours No results found for this basename: CHOL:2,HDL:2,LDLCALC:2,TRIG:2,CHOLHDL:2,LDLDIRECT:2 in the last 72 hours No results found for this basename: TSH,T4TOTAL,FREET3,T3FREE,THYROIDAB in the last 72 hours No results found for this basename: VITAMINB12:2,FOLATE:2,FERRITIN:2,TIBC:2,IRON:2,RETICCTPCT:2 in the last 72 hours  Micro Results: No results found for this or any previous visit (from the past 240 hour(s)).  Studies/Results: Dg Chest 2 View  09/15/2011  *RADIOLOGY REPORT*  Clinical Data: Lies weakness  CHEST - 2 VIEW  Comparison: 08/04/2011  Findings: Mild cardiomegaly.  Left midlung calcified granuloma. There are ill-defined opacities at the left base worrisome for patchy airspace disease.  Lungs are otherwise clear.  Pulmonary vascularity is within normal limits.  IMPRESSION: Patchy opacities at the left base worrisome for airspace disease. Follow-up radiographs until resolution are recommended.  Original Report Authenticated  By: Donavan Burnet, M.D.   Ct Head Wo Contrast  09/15/2011  *RADIOLOGY REPORT*  Clinical Data: Numbness.  Lightheadedness.  CT HEAD WITHOUT CONTRAST  Technique:  Contiguous axial images were obtained from the base of the skull through the vertex without contrast.  Comparison: None.  Findings: Global atrophy.  Chronic ischemic changes in the periventricular white matter.  No mass effect, midline shift, or acute intracranial hemorrhage.  Mastoid air cells are clear. Visualized paranasal sinuses are clear.  Cranium is intact.  IMPRESSION: No acute intracranial pathology.  Chronic changes.  Original Report Authenticated By: Donavan Burnet, M.D.    Medications: I have reviewed the patient's current medications. Scheduled Meds:   . sodium chloride   Intravenous Once  . ALPRAZolam  0.25 mg Oral QHS  . amitriptyline  10 mg Oral QHS  . azithromycin (ZITHROMAX) 500 MG IVPB  500 mg Intravenous Once  . buPROPion  100 mg Oral Daily  . calcium carbonate  1 tablet Oral Daily  . cholecalciferol  1,000 Units Oral Daily  . ciprofloxacin  200 mg Intravenous Once  . dextrose      . docusate sodium  100 mg Oral BID  . enoxaparin  40 mg Subcutaneous Q24H  . guaiFENesin  600 mg Oral BID  . hydrochlorothiazide  25 mg Oral Daily  . levofloxacin (LEVAQUIN) IV  750 mg Intravenous Q24H  . levothyroxine  100 mcg Oral Daily  . LORazepam  2 mg Oral STAT  . ondansetron  4 mg Intravenous Once  . perphenazine  2 mg Oral QODAY  . potassium chloride SA  40 mEq Oral Once  . DISCONTD: albuterol  2.5 mg Nebulization Q6H  . DISCONTD: azithromycin  500 mg Intravenous QHS  . DISCONTD: azithromycin      . DISCONTD: calcium carbonate  600 mg Oral Daily  . DISCONTD: ciprofloxacin  400 mg Intravenous Q12H  . DISCONTD: ipratropium  0.5 mg Nebulization Q6H  . DISCONTD: Lutein  1 tablet Oral Daily   Continuous Infusions:   . sodium chloride 50 mL/hr at 09/16/11 0300   PRN Meds:.acetaminophen, acetaminophen, albuterol,  HYDROcodone-acetaminophen, ipratropium, ondansetron (ZOFRAN) IV, ondansetron, senna, DISCONTD: albuterol Assessment/Plan: Patient Active Hospital Problem List: Pneumonia, organism unspecified (09/15/2011)   Assessment: The patient has findings consistent with pneumonia on a chest x-ray. I adjusted the medications to change him to Levaquin 750 mg IV daily. This would provide broad spectrum coverage both pneumonia and her urinary tract infection.   UTI (urinary tract infection) (09/15/2011)   Assessment: Patient's uterus and sent for culture the meanwhile the patient be on Levaquin for broad-spectrum coverage of uropathogens.    Episode of dizziness (09/16/2011)   Assessment: The patient complains of episodes of dizziness which are also affecting her ability to ventilate safely. A CT scan of the head was negative for any acute intracranial findings. I will also get carotid Dopplers to ensure the patient is not having  carotid problems or vertebral insufficiency. Additionally I will place a PTOT consult to evaluate the patient's gait. This patient likely benefit from a vestibular program for balance and gait of her discharge from the hospital.   The patient will have DVT prophylaxis with Lovenox continued.   LOS: 1 day

## 2011-09-16 NOTE — ED Notes (Signed)
Attempt to call report x 1, RN unable.  

## 2011-09-16 NOTE — ED Notes (Signed)
Report called to Marily Memos, RN on [phone]

## 2011-09-17 LAB — GLUCOSE, CAPILLARY
Glucose-Capillary: 146 mg/dL — ABNORMAL HIGH (ref 70–99)
Glucose-Capillary: 135 mg/dL — ABNORMAL HIGH (ref 70–99)

## 2011-09-17 NOTE — Progress Notes (Signed)
Physical Therapy Evaluation Patient Details Name: Paula [name] MRN: [id number] DOB: 07-06-1927 Today's Date: 09/17/2011  Problem List:  Patient Active Problem List  Diagnoses  . COPD (chronic obstructive pulmonary disease) with emphysema  . Diabetes mellitus type II, controlled  . Aneurysm of abdominal aorta  . Pneumonia, organism unspecified  . UTI (urinary tract infection)  . Episode of dizziness    Past Medical History:  Past Medical History  Diagnosis Date  . Diabetes mellitus   . Thyroid disease   . Abdominal aneurysm   . COPD (chronic obstructive pulmonary disease)   . Hypertension   . Shortness of breath   . Hypothyroidism   . Depression   . Recurrent upper respiratory infection (URI)    Past Surgical History:  Past Surgical History  Procedure Date  . Cholecystectomy   . Joint replacement   . Sinus surgery with instatrak   . Appendectomy   . Oophorectomy   . Arthroscopic repair acl     PT Assessment/Plan/Recommendation PT Assessment Clinical Impression Statement: Pt will benefit from Acute PT to improve overall functional mobility, balance and further assessment for vestibular impairment to prepare pt for safe d/c home with husband PT Recommendation/Assessment: Patient will need skilled PT in the acute care venue PT Problem List: Decreased balance;Decreased mobility;Decreased activity tolerance;Decreased cognition;Decreased safety awareness;Decreased knowledge of use of DME Barriers to Discharge: None PT Therapy Diagnosis : Difficulty walking;Abnormality of gait;Generalized weakness PT Plan PT Frequency: Min 3X/week PT Treatment/Interventions: Gait training;DME instruction;Stair training;Functional mobility training;Therapeutic exercise;Balance training;Neuromuscular re-education;Other (comment) (Vestibular compensations) PT Recommendation Follow Up Recommendations: Outpatient PT (Vestibular Rehab) Equipment Recommended: None recommended by PT PT Goals    Acute Rehab PT Goals PT Goal Formulation: With patient Time For Goal Achievement: 7 days Pt will go Sit to Supine/Side: with modified independence PT Goal: Sit to Supine/Side - Progress: Not met Pt will Stand: with modified independence PT Goal: Stand - Progress: Not met Pt will Ambulate: >150 feet;with modified independence;Other (comment) (with LRAD) PT Goal: Ambulate - Progress: Not met Pt will Go Up / Down Stairs: 1-2 stairs;with supervision;with rail(s) PT Goal: Up/Down Stairs - Progress: Not met  PT Evaluation Precautions/Restrictions  Precautions Precautions: Fall Prior Functioning  Home Living Lives With: Spouse Receives Help From: Family Type of Home: House Home Layout: One level Home Access: Stairs to enter Entrance Stairs-Rails: Right Entrance Stairs-Number of Steps: 1 Bathroom Shower/Tub: Engineer, manufacturing systems: Handicapped height Bathroom Accessibility: No Home Adaptive Equipment: Straight cane Additional Comments: cane at home but does not use it Prior Function Level of Independence: Independent with basic ADLs;Independent with transfers;Needs assistance with gait (Husband assist patient with gait) Driving: No Vocation: Retired Financial risk analyst Arousal/Alertness: Awake/alert Overall Cognitive Status: Impaired Attention: Impaired Current Attention Level: Sustained Attention - Other Comments: Pt easily distracted and needs cue to redirect to task. Memory: Appears impaired Orientation Level: Oriented to person;Disoriented to time;Disoriented to place;Disoriented to situation Safety/Judgement: Decreased safety judgement for tasks assessed Decreased Safety/Judgement: Decreased awareness of need for assistance Awareness of Deficits: Decreased awareness of deficits Problem Solving: Requires assistance for problem solving Cognition - Other Comments: Pt states "I know where I am but I can't pronounce it" and "I can't think of  it" Sensation/Coordination   Extremity Assessment RUE Assessment RUE Assessment: Within Functional Limits LUE Assessment LUE Assessment: Within Functional Limits RLE Assessment RLE Assessment: Within Functional Limits LLE Assessment LLE Assessment: Within Functional Limits Mobility (including Balance) Bed Mobility Bed Mobility: Yes Supine to Sit: 4: Min assist;With rails  Supine to Sit Details (indicate cue type and reason): Pt needed cues for hand placement and assistance OOB with use of rail. Sitting - Scoot to Edge of Bed: 6: Modified independent (Device/Increase time) Transfers Transfers: Yes Sit to Stand: 4: Min assist Sit to Stand Details (indicate cue type and reason): Assitance to initiate transfer with cues for hand and LE placement Stand to Sit: Other (comment) (Minguard for safety) Ambulation/Gait Ambulation/Gait: Yes Ambulation/Gait Assistance: 4: Min assist Ambulation/Gait Assistance Details (indicate cue type and reason): Pt needed min A to maintain balance with HHA.  Pt with wide BOS and shuffle gait. Ambulation Distance (Feet): 60 Feet Assistive device: 1 person hand held assist Gait Pattern: Shuffle Stairs: No Wheelchair Mobility Wheelchair Mobility: No    Vestibular Findings: Performed smooth pursuit and noticeable saccadic movement toward left during right smooth pursuit.  Attempted to perform VOR but difficult to assess secondary patient cognition.  Will further assess vestibular next session.  Questionable possible hypofunction on right side with current findings.   End of Session PT - End of Session Equipment Utilized During Treatment: Gait belt Activity Tolerance: Other (comment);Patient limited by fatigue (Limited due to dizziness) Patient left: in chair;with family/visitor present;with call bell in reach Nurse Communication: Mobility status for ambulation General Behavior During Session: [facility] for tasks performed Cognition: Impaired Cognitive  Impairment: Pt confused and inappropriate with responses at time.  Pt continues to be redirected to task.  Pt having difficulty with memory.  Celestia Duva 09/17/2011, 10:39 AM

## 2011-09-17 NOTE — Progress Notes (Signed)
Subjective: Patient appears to be having word finding difficulties today. She also seems to have some psychomotor retardation. The patient's daughter lives in the room with her and states that although she sometimes has difficulty with word finding this seems to be more pronounced than usual.  Interval history: Overnight the patient was very confused and combative. This cleared during the day however it appears that the patient is becoming somewhat confused again this evening. This patient is likely experiencing sundowning syndrome.   Objective: Filed Vitals:   09/17/11 0444 09/17/11 0643 09/17/11 1016 09/17/11 1300  BP:   148/88 163/97  Pulse:  90  100  Temp:    97.9 F (36.6 C)  TempSrc:    Oral  Resp:  20  18  Height:      Weight: 75.7 kg (166 lb 14.2 oz)     SpO2:  97% 94% 94%   Weight change: 0.856 kg (1 lb 14.2 oz)  Intake/Output Summary (Last 24 hours) at 09/17/11 2006 Last data filed at 09/17/11 1700  Gross per 24 hour  Intake   1000 ml  Output    950 ml  Net     50 ml    General: Alert, awake, oriented x3, in no acute distress.  HEENT: [place]/AT PEERL, EOMI Neck: Trachea midline,  no masses, no thyromegal,y no JVD, no carotid bruit OROPHARYNX:  Moist, No exudate/ erythema/lesions.  Heart: Regular rate and rhythm, without murmurs, rubs, gallops, PMI non-displaced, no heaves or thrills on palpation.  Lungs: Clear to auscultation, no wheezing or rhonchi noted. No increased vocal fremitus resonant to percussion  Abdomen: Soft, nontender, nondistended, positive bowel sounds, no masses no hepatosplenomegaly noted..  Neuro: No focal neurological deficits noted cranial nerves II through XII grossly intact. DTRs 2+ bilaterally upper and lower extremities. The patient appears to be having word finding difficulties, and her speech pattern is somewhat dysphasic. Musculoskeletal: No warm swelling or erythema around joints, no spinal tenderness noted. Psychiatric: Patient alert and  oriented x3, good insight and cognition, good recent to remote recall. Lymph node survey: No cervical axillary or inguinal lymphadenopathy noted.     Lab Results:  Basename 09/16/11 1105 09/16/11 0440 09/15/11 1800  NA 141 -- 140  K 3.7 -- 3.2*  CL 105 -- 101  CO2 27 -- 27  GLUCOSE 111* -- 135*  BUN 9 -- 13  CREATININE 0.73 0.75 --  CALCIUM 9.6 -- 10.3  MG -- -- --  PHOS -- -- --    Basename 09/15/11 1800  AST 16  ALT 16  ALKPHOS 63  BILITOT 0.3  PROT 6.6  ALBUMIN 3.6    Basename 09/15/11 1800  LIPASE 60*  AMYLASE --    Basename 09/16/11 1105 09/16/11 0440 09/15/11 1800  WBC 8.7 7.4 --  NEUTROABS -- -- 5.4  HGB 11.7* 10.6* --  HCT 34.8* 32.6* --  MCV 100.6* 101.9* --  PLT 274 284 --    Basename 09/15/11 1800  CKTOTAL --  CKMB --  CKMBINDEX --  TROPONINI <0.30   No results found for this basename: POCBNP:3 in the last 72 hours No results found for this basename: DDIMER:2 in the last 72 hours No results found for this basename: HGBA1C:2 in the last 72 hours No results found for this basename: CHOL:2,HDL:2,LDLCALC:2,TRIG:2,CHOLHDL:2,LDLDIRECT:2 in the last 72 hours No results found for this basename: TSH,T4TOTAL,FREET3,T3FREE,THYROIDAB in the last 72 hours No results found for this basename: VITAMINB12:2,FOLATE:2,FERRITIN:2,TIBC:2,IRON:2,RETICCTPCT:2 in the last 72 hours  Micro Results: No results  found for this or any previous visit (from the past 240 hour(s)).  Studies/Results: Dg Chest 2 View  09/15/2011  *RADIOLOGY REPORT*  Clinical Data: Lies weakness  CHEST - 2 VIEW  Comparison: 08/04/2011  Findings: Mild cardiomegaly.  Left midlung calcified granuloma. There are ill-defined opacities at the left base worrisome for patchy airspace disease.  Lungs are otherwise clear.  Pulmonary vascularity is within normal limits.  IMPRESSION: Patchy opacities at the left base worrisome for airspace disease. Follow-up radiographs until resolution are recommended.   Original Report Authenticated By: Donavan Burnet, M.D.   Ct Head Wo Contrast  09/15/2011  *RADIOLOGY REPORT*  Clinical Data: Numbness.  Lightheadedness.  CT HEAD WITHOUT CONTRAST  Technique:  Contiguous axial images were obtained from the base of the skull through the vertex without contrast.  Comparison: None.  Findings: Global atrophy.  Chronic ischemic changes in the periventricular white matter.  No mass effect, midline shift, or acute intracranial hemorrhage.  Mastoid air cells are clear. Visualized paranasal sinuses are clear.  Cranium is intact.  IMPRESSION: No acute intracranial pathology.  Chronic changes.  Original Report Authenticated By: Donavan Burnet, M.D.    Medications: I have reviewed the patient's current medications. Scheduled Meds:   . ALPRAZolam  0.25 mg Oral QHS  . amitriptyline  10 mg Oral QHS  . buPROPion  100 mg Oral Daily  . calcium carbonate  1 tablet Oral Daily  . cholecalciferol  1,000 Units Oral Daily  . docusate sodium  100 mg Oral BID  . enoxaparin  40 mg Subcutaneous Q24H  . guaiFENesin  600 mg Oral BID  . hydrochlorothiazide  25 mg Oral Daily  . levofloxacin (LEVAQUIN) IV  750 mg Intravenous Q24H  . levothyroxine  100 mcg Oral Daily  . perphenazine  2 mg Oral QODAY   Continuous Infusions:   . sodium chloride 50 mL/hr at 09/17/11 1130   PRN Meds:.acetaminophen, acetaminophen, albuterol, HYDROcodone-acetaminophen, ipratropium, ondansetron (ZOFRAN) IV, ondansetron, senna Assessment/Plan: Patient Active Hospital Problem List: Pneumonia, organism unspecified (09/15/2011)   Assessment: Patient is on day #2 of Levaquin which is broad spectrum to cover both pneumonia and urinary tract infection. The patient is experiencing no hypoxemia no difficulty with breathing. I will continue Levaquin for a total of 8 days.     UTI (urinary tract infection) (09/15/2011)   Assessment: The patient had a urinalysis was suggestive of urinary tract infection. However it  appears that the urine culture was not sent to the lab from the emergency room. I removed repeat her urine analysis and urine culture on the day. The patient however is on Levaquin which is broad spectrum for urinary pathogens.    Episode of dizziness (09/16/2011)   Assessment: The patient denies any further dizziness today. I appreciate the input from physical therapy and agree with her assessment of the patient having to stay with her. I would recommend that the patient had been evaluated for physical therapy as an outpatient as recommended by physical therapy unless she requires inpatient rehabilitation by the time she is discharged from the hospital.    Dysphasia Assessment: The patient had a CT this scalp head that showed no acute findings. However the patient is having word finding difficulties and some degree of dysphagia. I'll ask for speech and language pathology to evaluate her leg which cognitive function. However also go ahead and order MRI of the brain to make sure that we are not missing a small stroke in this patient. The patient has  had a carotid duplex of her vessels done which showed no ICA stenosis.  Hypothyroidism Assessment: The patient has a history of hypothyroidism. Given her mild confusion and dysphasia I will check an TSH to ensure that she is euthyroid.  Continue DVT prophylaxis with Lovenox.   LOS: 2 days

## 2011-09-17 NOTE — Progress Notes (Signed)
Pt has been aggitated all night. She is paranoid and states "why are you trying to kill me" repeatedly. Pt grabbed RN's wrists one time and after that she has refused most treatments. Pt's sister-in-law has stayed majority of the night to help.

## 2011-09-18 ENCOUNTER — Inpatient Hospital Stay (HOSPITAL_COMMUNITY): Payer: Medicare Other

## 2011-09-18 DIAGNOSIS — K118 Other diseases of salivary glands: Secondary | ICD-10-CM | POA: Diagnosis present

## 2011-09-18 LAB — URINALYSIS, ROUTINE W REFLEX MICROSCOPIC
Bilirubin Urine: NEGATIVE
Glucose, UA: NEGATIVE mg/dL
Hgb urine dipstick: NEGATIVE
Ketones, ur: 15 mg/dL — AB
Nitrite: NEGATIVE
Specific Gravity, Urine: 1.019 (ref 1.005–1.030)
pH: 6.5 (ref 5.0–8.0)

## 2011-09-18 LAB — GLUCOSE, CAPILLARY
Glucose-Capillary: 111 mg/dL — ABNORMAL HIGH (ref 70–99)
Glucose-Capillary: 106 mg/dL — ABNORMAL HIGH (ref 70–99)

## 2011-09-18 MED ORDER — LEVOFLOXACIN 500 MG PO TABS
500.0000 mg | ORAL_TABLET | Freq: Every day | ORAL | Status: DC
  Administered 2011-09-19: 500 mg via ORAL
  Filled 2011-09-18: qty 1

## 2011-09-18 NOTE — Plan of Care (Signed)
Problem: Phase II Progression Outcomes Goal: Progress activity as tolerated unless otherwise ordered Outcome: Not Progressing Not progressing due to patient agitation and confusion.

## 2011-09-18 NOTE — Progress Notes (Signed)
Physical Therapy Treatment Patient Details Name: Paula [name] MRN: [id number] DOB: 1927-08-17 Today's Date: 09/18/2011  PT Assessment/Plan  PT - Assessment/Plan Comments on Treatment Session: Pt did not tolerate treatment well. Pt kept repeating "I get dizzy when I take all these pills."  When questioned if her medication made her dizzy when she took it at home, patient became increasingly agitated and angry.  Pt also reported being anxious about standing.  Pt said she would walk with PT and then when she was transfered to sitting EOB, she became dizzy, agitated and refused to stand.  Pt was unable to follow simple commands to perform sit to stand.  After several refusals to stand and move to chair, pt finally consented and moved to recliner.  Spoke with husband and PT will attempt to return this afternoon if time allows, to see if pt will be willing to try ambulation. PT Plan: Frequency remains appropriate;Discharge plan needs to be updated PT Frequency: Min 3X/week Follow Up Recommendations: Home health PT Equipment Recommended: Rolling walker with 5" wheels PT Goals  Acute Rehab PT Goals PT Goal Formulation: With patient/family PT Goal: Sit to Supine/Side - Progress: Not met PT Goal: Stand - Progress: Not met PT Goal: Ambulate - Progress: Not met PT Goal: Up/Down Stairs - Progress: Not met  PT Treatment Precautions/Restrictions  Precautions Precautions: Fall Restrictions Weight Bearing Restrictions: No Mobility (including Balance) Bed Mobility Bed Mobility: Yes Supine to Sit: 4: Min assist;With rails;HOB elevated (Comment degrees) Supine to Sit Details (indicate cue type and reason): Pt needed (A) to complete task.  Verbal directions for hand placement and LE movement. Sitting - Scoot to Edge of Bed: 4: Min assist Sitting - Scoot to [place] of Bed Details (indicate cue type and reason): Verbal cues / directions to initiate.  Min (A) for balance and pt safety.  Pt very anxious and  reports dizziness. Sit to Supine - Left: 4: Min assist;HOB elevated (comment degrees) Transfers Transfers: Yes Sit to Stand: Other (comment);From bed ([place] (A)) Sit to Stand Details (indicate cue type and reason): (A) for pt safety due to pt being very anxious.  Pt did not want (A) with standing, however. Stand to Sit: 4: Min assist;To chair/3-in-1 Stand to Sit Details: Min (A) for eccentric control and pt balance.  Cues for hand placement. Ambulation/Gait Ambulation/Gait: No (Pt agitated, reported dizziness, and refused to ambulate.)    Exercise    End of Session PT - End of Session Equipment Utilized During Treatment: Gait belt Activity Tolerance: Treatment limited secondary to agitation Patient left: in chair;with family/visitor present Nurse Communication: Mobility status for ambulation;Other (comment) (Increasing level of agitation and confusion) General Behavior During Session: Agitated Cognition: Impaired Cognitive Impairment: Pt was confused, not completely oriented to place and unoriented to time.  Pt had memory deficits and perseverated on her "taking too many pills."  Lacinda Axon, SPT Aggie Douse 09/18/2011, 1:38 PM

## 2011-09-18 NOTE — Progress Notes (Signed)
Subjective: Still dizzy this a.m. Took all her medicines together and attributes it to that  Objective: Vital signs in last 24 hours: Temp:  [98.8 F (37.1 C)-99.1 F (37.3 C)] 99.1 F (37.3 C) (11/07 0532) Pulse Rate:  [90-98] 90  (11/07 0532) Resp:  [18] 18  (11/07 0532) BP: (163-166)/(81-91) 163/81 mmHg (11/07 0532) SpO2:  [93 %-94 %] 94 % (11/07 0532) Weight:  [75.388 kg (166 lb 3.2 oz)] 166 lb 3.2 oz (75.388 kg) (11/07 0532) Weight change: -0.312 kg (-11 oz) Last BM Date: 09/14/11  Intake/Output from previous day: 11/06 0701 - 11/07 0700 In: 2280 [P.O.:1080; I.V.:1200] Out: 750 [Urine:750]     Physical Exam: General: Alert, awake, oriented x3, in no acute distress. HEENT: No bruits, no goiter. Heart: Regular rate and rhythm, without murmurs, rubs, gallops. Lungs: Clear to auscultation bilaterally. Abdomen: Soft, nontender, nondistended, positive bowel sounds. Extremities: No clubbing cyanosis or edema with positive pedal pulses. Neuro: Grossly intact, nonfocal.    Lab Results: Basic Metabolic Panel:  Basename 09/16/11 1105 09/16/11 0440 09/15/11 1800  NA 141 -- 140  K 3.7 -- 3.2*  CL 105 -- 101  CO2 27 -- 27  GLUCOSE 111* -- 135*  BUN 9 -- 13  CREATININE 0.73 0.75 --  CALCIUM 9.6 -- 10.3  MG -- -- --  PHOS -- -- --   Liver Function Tests:  Basename 09/15/11 1800  AST 16  ALT 16  ALKPHOS 63  BILITOT 0.3  PROT 6.6  ALBUMIN 3.6    Basename 09/15/11 1800  LIPASE 60*  AMYLASE --   No results found for this basename: AMMONIA:2 in the last 72 hours CBC:  Basename 09/16/11 1105 09/16/11 0440 09/15/11 1800  WBC 8.7 7.4 --  NEUTROABS -- -- 5.4  HGB 11.7* 10.6* --  HCT 34.8* 32.6* --  MCV 100.6* 101.9* --  PLT 274 284 --   Cardiac Enzymes:  Basename 09/15/11 1800  CKTOTAL --  CKMB --  CKMBINDEX --  TROPONINI <0.30   BNP: No results found for this basename: POCBNP:3 in the last 72 hours D-Dimer: No results found for this basename:  DDIMER:2 in the last 72 hours CBG:  Basename 09/18/11 1048 09/18/11 0612 09/17/11 2119 09/17/11 1640 09/17/11 1225 09/17/11 0705  GLUCAP 110* 124* 135* 129* 117* 146*   Hemoglobin A1C: No results found for this basename: HGBA1C in the last 72 hours Fasting Lipid Panel: No results found for this basename: CHOL,HDL,LDLCALC,TRIG,CHOLHDL,LDLDIRECT in the last 72 hours Thyroid Function Tests: No results found for this basename: TSH,T4TOTAL,FREET4,T3FREE,THYROIDAB in the last 72 hours Anemia Panel: No results found for this basename: VITAMINB12,FOLATE,FERRITIN,TIBC,IRON,RETICCTPCT in the last 72 hours Coagulation: No results found for this basename: LABPROT:2,INR:2 in the last 72 hours Urine Drug Screen:  Alcohol Level: No results found for this basename: ETH:2 in the last 72 hours Urinalysis:  Misc. Labs:  No results found for this or any previous visit (from the past 240 hour(s)).  Studies/Results: Mr Paula [name] Contrast  09/18/2011  *RADIOLOGY REPORT*  Clinical Data: 77 year old female with confusion, dysphasia, unsteady gait.  The patient reports an allergy to contrast (listed in the medical record is iodinated contrast) and declined gadolinium contrast for this exam.  MRI HEAD WITHOUT CONTRAST  Technique:  Multiplanar, multiecho pulse sequences of the brain and surrounding structures were obtained according to standard protocol without intravenous contrast.  Comparison: Head CTs 09/15/2011 and earlier. Neck CT 05/13/2011.  Findings: No restricted diffusion to suggest acute infarction.  No midline shift,  mass effect, evidence of mass lesion, ventriculomegaly, extra-axial collection or acute intracranial hemorrhage.  Cervicomedullary junction and pituitary are within normal limits.  Major intracranial vascular flow voids are preserved with a degree of intracranial artery dolichoectasia.  Scattered chronic micro hemorrhages, mostly in the frontal lobe subcortical white matter (series 7 image  12, 13).  Confluent periventricular and central cerebral white matter T2 and FLAIR hyperintensity.  Comparatively mild T2 hyperintensity in the pons. Deep gray matter nuclei and cerebellum within normal limits for age.  Negative visualized cervical spine. Visualized bone marrow signal is within normal limits.  Grossly normal visualized internal auditory structures.  Postoperative changes to the globes. Visualized paranasal sinuses and mastoids are clear.  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 This is stable since 05/13/2011.  IMPRESSION: 1. No acute intracranial abnormality.  Chronic small vessel disease. 2.  Right parotid gland mass is stable since 05/13/2011, but decreased T2 signal is noted and tumors with low signal intensity on T2-weighted images are more likely to be malignant even when they are well-demarcated.  Recommend ENT follow-up.  Original Report Authenticated By: Harley Hallmark, M.D.    Medications: Scheduled Meds:   . ALPRAZolam  0.25 mg Oral QHS  . amitriptyline  10 mg Oral QHS  . buPROPion  100 mg Oral Daily  . calcium carbonate  1 tablet Oral Daily  . cholecalciferol  1,000 Units Oral Daily  . docusate sodium  100 mg Oral BID  . enoxaparin  40 mg Subcutaneous Q24H  . guaiFENesin  600 mg Oral BID  . hydrochlorothiazide  25 mg Oral Daily  . levofloxacin (LEVAQUIN) IV  750 mg Intravenous Q24H  . levothyroxine  100 mcg Oral Daily  . perphenazine  2 mg Oral QODAY   Continuous Infusions:   . sodium chloride 50 mL/hr at 09/17/11 1130   PRN Meds:.acetaminophen, acetaminophen, albuterol, HYDROcodone-acetaminophen, ipratropium, ondansetron (ZOFRAN) IV, ondansetron, senna  Assessment/Plan:  Principal Problem:  1.*Pneumonia, organism unspecified: Continue Levaquin day 3 of 7, febrile respiratory status improved and leukocytosis resolved. Active Problems:  2.UTI (urinary tract  infection): Urine culture is still pending of limited value now since has been on antibiotics for 2 days  3.Episode of dizziness: Check orthostatics, cut down and her amitriptyline dose since this can cause orthostatic hypotension. Arm MRI brain as ordered per Dr. Ashley Royalty to evaluate this is unremarkable and carotid duplex is normal as well. Await PT OT evaluation.  4. Parotid mass: Followed by Dr. Pollyann Kennedy with [facility] tomorrow if stable, will likely need home health services    LOS: 3 days   Crisanto Nied 09/18/2011, 1:02 PM

## 2011-09-18 NOTE — Progress Notes (Addendum)
Occupational Therapy Evaluation Patient Details Name: Paula [name] MRN: [id number] DOB: 01-30-1927 Today's Date: 09/18/2011  Problem List:  Patient Active Problem List  Diagnoses  . COPD (chronic obstructive pulmonary disease) with emphysema  . Diabetes mellitus type II, controlled  . Aneurysm of abdominal aorta  . Pneumonia, organism unspecified  . UTI (urinary tract infection)  . Episode of dizziness    Past Medical History:  Past Medical History  Diagnosis Date  . Diabetes mellitus   . Thyroid disease   . Abdominal aneurysm   . COPD (chronic obstructive pulmonary disease)   . Hypertension   . Shortness of breath   . Hypothyroidism   . Depression   . Recurrent upper respiratory infection (URI)    Past Surgical History:  Past Surgical History  Procedure Date  . Cholecystectomy   . Joint replacement   . Sinus surgery with instatrak   . Appendectomy   . Oophorectomy   . Arthroscopic repair acl     OT Assessment/Plan/Recommendation OT Assessment Clinical Impression Statement: Pt. would benefit from acute OT services in order to increase participation in ADLs to general Supervision level to return home safely with spouse and HHOT. OT Recommendation/Assessment: Patient will need skilled OT in the acute care venue OT Problem List: Decreased activity tolerance;Impaired balance (sitting and/or standing);Decreased cognition;Decreased safety awareness;Decreased knowledge of use of DME or AE OT Therapy Diagnosis : Cognitive deficits OT Plan OT Frequency: Min 1X/week OT Treatment/Interventions: Self-care/ADL training;DME and/or AE instruction;Therapeutic activities OT Recommendation Recommendations for Other Services: Speech consult Follow Up Recommendations: Home health OT Individuals Consulted Consulted and Agree with Results and Recommendations: Patient;Family member/caregiver Family Member Consulted: Husband OT Goals Acute Rehab OT Goals OT Goal Formulation: With  patient Time For Goal Achievement: 2 weeks ADL Goals Pt Will Perform Grooming: with set-up;Standing at sink ADL Goal: Grooming - Progress: Not met Pt Will Transfer to Toilet: with supervision ADL Goal: Toilet Transfer - Progress: Not met  OT Evaluation Precautions/Restrictions  Precautions Precautions: Fall Restrictions Weight Bearing Restrictions: No Prior Functioning Home Living Lives With: Spouse Receives Help From: Family Type of Home: House Home Layout: One level Home Access: Stairs to enter Entrance Stairs-Rails: Right Entrance Stairs-Number of Steps: 1 Bathroom Shower/Tub: Engineer, manufacturing systems: Handicapped height Prior Function Level of Independence: Independent with basic ADLs ADL ADL Eating/Feeding: Simulated;Independent Where Assessed - Eating/Feeding: Edge of bed Grooming: Simulated;Set up Where Assessed - Grooming: Sitting, bed;Unsupported Upper Body Bathing: Simulated;Set up Where Assessed - Upper Body Bathing: Sitting, bed;Unsupported Lower Body Bathing: Not assessed;Other (comment) (Pt. refused) Upper Body Dressing: Performed;Set up Upper Body Dressing Details (indicate cue type and reason): donned 2nd gown Where Assessed - Upper Body Dressing: Sitting, bed;Unsupported Lower Body Dressing: Not assessed;Other (comment) (Pt. refused.) Toilet Transfer: Simulated;Minimal assistance Toilet Transfer Method: Stand pivot Toileting - Clothing Manipulation: Not assessed Toileting - Hygiene: Not assessed Tub/Shower Transfer: Not assessed ADL Comments: OT educated and rec. shower chair for ease and safety of showering at home.  Pt. adamantly refuses.  Pt. spouse does not speak up.  PT. refused LB ADL simulation.  Pt. states: Just tell the doctor I can do everything just fine.  Vision/Perception  Vision - History Baseline Vision: Wears glasses all the time Vision - Assessment Vision Assessment: Vision not tested Perception Perception: Not  tested Praxis Praxis: Not tested Cognition Cognition Arousal/Alertness: Awake/alert Overall Cognitive Status: Impaired Attention: Impaired Current Attention Level: Sustained Attention - Other Comments: Pt. requires mod verbal cues to stay on task.  Pt.  continues to repeat same thing over and over again. Memory: Appears impaired Memory Deficits: Pt. self-reports having problems with memory. Orientation Level: Disoriented to place;Oriented to person;Disoriented to time;Disoriented to situation Safety/Judgement: Decreased safety judgement for tasks assessed;Decreased awareness of safety precautions Decreased Safety/Judgement: Decreased awareness of need for assistance Awareness of Deficits: Decreased awareness of deficits Problem Solving: Requires assistance for problem solving Sensation/Coordination   Extremity Assessment RUE Assessment RUE Assessment: Within Functional Limits LUE Assessment LUE Assessment: Within Functional Limits Mobility  Bed Mobility Bed Mobility: Yes Supine to Sit: 4: Min assist;With rails;HOB elevated (Comment degrees) Sitting - Scoot to Edge of Bed: 5: Supervision Sit to Supine - Left: 4: Min assist;HOB elevated (comment degrees) Transfers Sit to Stand: 3: Mod assist Stand to Sit: 4: Min assist Exercises   End of Session OT - End of Session Equipment Utilized During Treatment: Gait belt;Other (comment) (Pt. refused RW) Activity Tolerance: Treatment limited secondary to agitation (Pt. states "I will not get in that chair or go to sink") Patient left: in bed;with family/visitor present;Other (comment) (pt. husband present) General Behavior During Session: Restless Cognition: Impaired Cognitive Impairment: Pt. requires mod verbal cutes to stay on task.     Peyton Najjar Beatrice-Mitchell 09/18/2011, 12:03 PM   Addendum: OT frequency set at 1X/week secondary to patient agitation and restlessness with initial OT session.  Pt. Would benefit from  continued therapy with HHOT in a more familiar environment once patient d/c's home.

## 2011-09-18 NOTE — Progress Notes (Signed)
UR review completed. 

## 2011-09-19 ENCOUNTER — Encounter (HOSPITAL_COMMUNITY)
Admission: EM | Disposition: A | Discharge status: Home Health | Payer: Self-pay | Source: Home / Self Care | Attending: Internal Medicine

## 2011-09-19 ENCOUNTER — Ambulatory Visit (HOSPITAL_COMMUNITY): Admit: 2011-09-19 | Payer: Self-pay | Admitting: Internal Medicine

## 2011-09-19 ENCOUNTER — Other Ambulatory Visit: Payer: Self-pay

## 2011-09-19 ENCOUNTER — Inpatient Hospital Stay (HOSPITAL_COMMUNITY): Payer: Medicare Other

## 2011-09-19 DIAGNOSIS — R001 Bradycardia, unspecified: Secondary | ICD-10-CM

## 2011-09-19 DIAGNOSIS — I959 Hypotension, unspecified: Secondary | ICD-10-CM

## 2011-09-19 DIAGNOSIS — I495 Sick sinus syndrome: Secondary | ICD-10-CM

## 2011-09-19 DIAGNOSIS — I442 Atrioventricular block, complete: Secondary | ICD-10-CM

## 2011-09-19 DIAGNOSIS — I498 Other specified cardiac arrhythmias: Secondary | ICD-10-CM

## 2011-09-19 DIAGNOSIS — F05 Delirium due to known physiological condition: Secondary | ICD-10-CM

## 2011-09-19 DIAGNOSIS — R55 Syncope and collapse: Secondary | ICD-10-CM

## 2011-09-19 DIAGNOSIS — E039 Hypothyroidism, unspecified: Secondary | ICD-10-CM | POA: Diagnosis present

## 2011-09-19 DIAGNOSIS — J13 Pneumonia due to Streptococcus pneumoniae: Secondary | ICD-10-CM

## 2011-09-19 HISTORY — PX: TEMPORARY PACEMAKER INSERTION: SHX5471

## 2011-09-19 HISTORY — DX: Bradycardia, unspecified: R00.1

## 2011-09-19 LAB — CBC
HCT: 34.5 % — ABNORMAL LOW (ref 36.0–46.0)
HCT: 33.3 % — ABNORMAL LOW (ref 36.0–46.0)
MCH: 33.8 pg (ref 26.0–34.0)
MCHC: 34.8 g/dL (ref 30.0–36.0)
MCV: 97.2 fL (ref 78.0–100.0)
MCV: 97.1 fL (ref 78.0–100.0)
Platelets: 287 10*3/uL (ref 150–400)
RBC: 3.55 MIL/uL — ABNORMAL LOW (ref 3.87–5.11)
RDW: 13 % (ref 11.5–15.5)
RDW: 13 % (ref 11.5–15.5)
WBC: 8.4 10*3/uL (ref 4.0–10.5)

## 2011-09-19 LAB — BASIC METABOLIC PANEL
CO2: 23 mEq/L (ref 19–32)
Chloride: 94 mEq/L — ABNORMAL LOW (ref 96–112)
GFR calc Af Amer: >90 mL/min (ref >90)
Potassium: 3.2 mEq/L — ABNORMAL LOW (ref 3.5–5.1)

## 2011-09-19 LAB — CREATININE, SERUM: GFR calc non Af Amer: 79 mL/min — ABNORMAL LOW (ref >90)

## 2011-09-19 LAB — URINE CULTURE
Colony Count: NO GROWTH
Culture  Setup Time: 201211070917

## 2011-09-19 LAB — TSH: TSH: 2.694 u[IU]/mL (ref 0.350–4.500)

## 2011-09-19 LAB — GLUCOSE, CAPILLARY
Glucose-Capillary: 116 mg/dL — ABNORMAL HIGH (ref 70–99)
Glucose-Capillary: 122 mg/dL — ABNORMAL HIGH (ref 70–99)
Glucose-Capillary: 124 mg/dL — ABNORMAL HIGH (ref 70–99)

## 2011-09-19 SURGERY — TEMPORARY PACEMAKER INSERTION
Anesthesia: LOCAL

## 2011-09-19 MED ORDER — SODIUM CHLORIDE 0.9 % IV SOLN: 250.0000 mL | INTRAVENOUS | Status: DC

## 2011-09-19 MED ORDER — PHENTOLAMINE MESYLATE 5 MG IJ SOLR: 5.0000 mg | Freq: Once | INTRAMUSCULAR | Status: DC

## 2011-09-19 MED ORDER — MIDAZOLAM HCL 2 MG/2ML IJ SOLN
2.0000 mg | Freq: Once | INTRAMUSCULAR | Status: AC
  Administered 2011-09-19: 2 mg via INTRAVENOUS

## 2011-09-19 MED ORDER — LEVOFLOXACIN 500 MG PO TABS: 500.0000 mg | ORAL_TABLET | Freq: Every day | ORAL | Status: AC

## 2011-09-19 MED ORDER — ACETAMINOPHEN 325 MG PO TABS: 650.0000 mg | ORAL_TABLET | ORAL | Status: DC | PRN

## 2011-09-19 MED ORDER — MIDAZOLAM HCL 2 MG/2ML IJ SOLN
INTRAMUSCULAR | Status: AC
  Filled 2011-09-19: qty 2

## 2011-09-19 MED ORDER — SODIUM CHLORIDE 0.9 % IV SOLN: INTRAVENOUS | Status: AC

## 2011-09-19 MED ORDER — HALOPERIDOL LACTATE 5 MG/ML IJ SOLN
3.0000 mg | Freq: Once | INTRAMUSCULAR | Status: AC
  Administered 2011-09-19: 3 mg via INTRAVENOUS
  Filled 2011-09-19: qty 1

## 2011-09-19 MED ORDER — ONDANSETRON HCL 4 MG/2ML IJ SOLN: 4.0000 mg | Freq: Four times a day (QID) | INTRAMUSCULAR | Status: DC | PRN

## 2011-09-19 MED ORDER — SODIUM CHLORIDE 0.9 % IV SOLN
0.2000 ug/kg/h | INTRAVENOUS | Status: AC
  Administered 2011-09-19: 0.2 ug/kg/h via INTRAVENOUS
  Administered 2011-09-20: 0.4 ug/kg/h via INTRAVENOUS
  Filled 2011-09-19 (×2): qty 2

## 2011-09-19 MED ORDER — PHENTOLAMINE MESYLATE 5 MG IJ SOLR
5.0000 mg | INTRAMUSCULAR | Status: AC
Start: 2011-09-19 — End: 2011-09-20
  Filled 2011-09-19: qty 5

## 2011-09-19 MED ORDER — SODIUM CHLORIDE 0.9 % IJ SOLN: 3.0000 mL | INTRAMUSCULAR | Status: DC | PRN

## 2011-09-19 MED ORDER — EPINEPHRINE HCL 0.1 MG/ML IJ SOLN
INTRAMUSCULAR | Status: AC
  Filled 2011-09-19: qty 10

## 2011-09-19 MED ORDER — HEPARIN SODIUM (PORCINE) 5000 UNIT/ML IJ SOLN
5000.0000 [IU] | Freq: Three times a day (TID) | INTRAMUSCULAR | Status: DC
  Administered 2011-09-20 – 2011-09-21 (×5): 5000 [IU] via SUBCUTANEOUS
  Filled 2011-09-19 (×5): qty 1

## 2011-09-19 MED ORDER — ATROPINE SULFATE 1 MG/ML IJ SOLN
INTRAMUSCULAR | Status: AC
  Filled 2011-09-19: qty 1

## 2011-09-19 MED ORDER — DOPAMINE-DEXTROSE 3.2-5 MG/ML-% IV SOLN
2.5000 ug/kg/min | INTRAVENOUS | Status: DC
  Administered 2011-09-19: 2.5 ug/kg/min via INTRAVENOUS
  Filled 2011-09-19: qty 250

## 2011-09-19 MED ORDER — DOXYCYCLINE HYCLATE 100 MG PO TABS
100.0000 mg | ORAL_TABLET | Freq: Two times a day (BID) | ORAL | Status: DC
  Administered 2011-09-20 (×2): 100 mg via ORAL
  Filled 2011-09-19 (×5): qty 1

## 2011-09-19 MED ORDER — SODIUM CHLORIDE 0.9 % IJ SOLN
3.0000 mL | Freq: Two times a day (BID) | INTRAMUSCULAR | Status: DC
  Administered 2011-09-20: 3 mL via INTRAVENOUS
  Administered 2011-09-20: 09:00:00 via INTRAVENOUS
  Administered 2011-09-20: 3 mL via INTRAVENOUS

## 2011-09-19 MED FILL — Medication: Qty: 1 | Status: AC

## 2011-09-19 NOTE — Progress Notes (Signed)
1200 pt d/c to home when she c/o dizzines with diaphoresis. Orthostatic bp's taken and negative. Dr. Jomarie Longs aware. 15 min later pt had another episode. Dr Jomarie Longs again made aware. Pt was not to be d/c'd. While putting pt back on telemetry< pt had another episode. Dr Jomarie Longs on unit. Pt had captured episode of ventricular standstill. External pacer on. Drn Myrtis Ser consulted by Dr Jomarie Longs. ! Amp epi given with hr 120-140s. Hr noted to drop to 30s at times. Pt trans to cath lab for temp pacer

## 2011-09-19 NOTE — Discharge Summary (Signed)
Electrophysiology Consult Note    Patient ID: Paula [name] MRN: [id number], DOB/AGE: 05/11/1927 77 y.o.  Admit date: 09/15/2011 Date of Consult: 09/19/2011  Primary Physician: Georgianne Fick, MD, MD Primary Cardiologist: Dr. Myrtis Ser  Chief Complaint: Complete heart block Reason for Consultation: Syncope  HPI: Paula [name] is an 77 year old woman admitted about a week ago because of a 3 or 4 week history of relatively nonspecific complaints.   There has been  Anorexia, weakness, and some confusion. She's had some lightheadedness, although this has been relatively slow in onset. Her husband recalls her having gotten dizzy and putting her head on the table. She took they rag from his hand and wiped it subsequently. They deny syncope or seizures.  She has no known cardiac disease. She does however have a AAA is greater than 5 cm and has been followed by Dr. Hart Rochester.  Her ability to give a more extensive past history is markedly limited.  This morning just prior to discharge she lost consciousness. On telemetry she developed complete heart block and sinus node dysfunction. While lying in bed some time later she had a seizure iagain associated with complete heart block.  She underwent temporary transvenous pacing this afternoon.   Apparently also she had a fall last night unassociated with rhythm disturbances Past Medical History  Diagnosis Date  . Diabetes mellitus   . Thyroid disease   . Abdominal aneurysm   . COPD (chronic obstructive pulmonary disease)   . Hypertension   . Shortness of breath   . Hypothyroidism   . Depression   . Recurrent upper respiratory infection (URI)       Surgical History:  Past Surgical History  Procedure Date  . Cholecystectomy   . Joint replacement   . Sinus surgery with instatrak   . Appendectomy   . Oophorectomy   . Arthroscopic repair acl      Prescriptions prior to admission  Medication Sig Dispense Refill  . ALPRAZolam (XANAX) 0.25 MG tablet  Take 0.25 mg by mouth at bedtime as needed. TAKES 1 TAB IN THE MORNING IN 2 TABS IN THE EVENING      . amitriptyline (ELAVIL) 10 MG tablet Take 10 mg by mouth at bedtime.        Marland Kitchen buPROPion (WELLBUTRIN) 100 MG tablet Take 100 mg by mouth daily.       . calcium carbonate (OS-CAL) 600 MG TABS Take 600 mg by mouth daily.        . cholecalciferol (VITAMIN D) 1000 UNITS tablet Take 1,000 Units by mouth daily.        . hydrochlorothiazide (HYDRODIURIL) 25 MG tablet Take 25 mg by mouth daily.        Marland Kitchen levothyroxine (SYNTHROID, LEVOTHROID) 100 MCG tablet Take 100 mcg by mouth daily.        . Lutein 10 MG TABS Take 1 tablet by mouth daily.        Marland Kitchen DISCONTD: perphenazine (TRILAFON) 4 MG tablet Take 2 mg by mouth every other day.         Inpatient Medications:     . ALPRAZolam  0.25 mg Oral QHS  . atropine      . buPROPion  100 mg Oral Daily  . calcium carbonate  1 tablet Oral Daily  . cholecalciferol  1,000 Units Oral Daily  . docusate sodium  100 mg Oral BID  . EPINEPHrine      . EPINEPHrine      . guaiFENesin  600 mg Oral  BID  . heparin  5,000 Units Subcutaneous Q8H  . hydrochlorothiazide  25 mg Oral Daily  . levofloxacin  500 mg Oral Daily  . levothyroxine  100 mcg Oral Daily  . phentolamine  5 mg Subcutaneous To Cath  . sodium chloride  3 mL Intravenous Q12H  . DISCONTD: enoxaparin  40 mg Subcutaneous Q24H  . DISCONTD: phentolamine  5 mg Subcutaneous Once    Allergies:  Allergies  Allergen Reactions  . Sulfa Antibiotics Anaphylaxis  . Contrast Media (Iodinated Diagnostic Agents) Swelling  . Iohexol      Desc: PT STATES SHE HAD A SEVERE REACTION TO IV CONRAST WITH THROAT SWELLING AND SOB. SHE WAS ADMITTED TO THE HOSPITAL. SHE HAS NEVER HAD CONTRAST AGAIN.   Marland Kitchen Penicillins Rash    History   Social History  . Marital Status: Married    Spouse Name: N/A    Number of Children: N/A  . Years of Education: N/A   Occupational History  . Not on file.   Social History Main  Topics  . Smoking status: Former Smoker    Quit date: 08/31/2011  . Smokeless tobacco: Not on file  . Alcohol Use: No  . Drug Use: No  . Sexually Active:    Other Topics Concern  . Not on file   Social History Narrative  . No narrative on file     Family History  Problem Relation Age of Onset  . Cancer Father      Review of Systems: General: negative for chills, fever, night sweats or weight changes.  Cardiovascular: negative for chest pain, dyspnea on exertion, edema, orthopnea, palpitations, paroxysmal nocturnal dyspnea or shortness of breath Dermatological: negative for rash Respiratory: negative for cough or wheezing Urologic: negative for hematuria Abdominal: negative for nausea, vomiting, diarrhea, bright red blood per rectum, melena, or hematemesis Neurologic: negative for visual changes, syncope, or dizziness All other systems reviewed and are otherwise negative except as noted above.    Physical Exam:  Blood pressure 110/68, pulse 112, temperature 98.4 F (36.9 C), temperature source Oral, resp. rate 22, height 5\' 6"  (1.676 m), weight 73.846 kg (162 lb 12.8 oz), SpO2 92.00%.  General appearance: alert, appears older than stated age and no distress Head: Normocephalic, without obvious abnormality, atraumatic Eyes: conjunctivae/corneas clear. PERRL, EOM's intact. Fundi benign. Ears: normal TM's and external ear canals both ears Nose: Nares normal. Septum midline. Mucosa normal. No drainage or sinus tenderness. Throat: lips, mucosa, and tongue normal; teeth and gums normal Neck: no adenopathy, no carotid bruit, no JVD, supple, symmetrical, trachea midline and thyroid not enlarged, symmetric, no tenderness/mass/nodules Back: symmetric, no curvature. ROM normal. No CVA tenderness., Not examined as she was laid on her back Resp: clear to auscultation bilaterally and listened to laterally Chest wall: no tenderness Cardio: regular rate and rhythm, S1, S2 normal, no  murmur, click, rub or gallop GI: soft, non-tender; bowel sounds normal; no masses,  no organomegaly Extremities: extremities normal, atraumatic, no cyanosis or edema Pulses: 2+ and symmetric Trace pulses bilaterally Skin: Skin color, texture, turgor normal. No rashes or lesions Lymph nodes: Cervical, supraclavicular, and axillary nodes normal. normal affect  neurological exam demonstrates normal gross motor function; she is alert and oriented x2. She is modestly confused.  Labs: Lab Results  Component Value Date   WBC 8.4 09/19/2011   HGB 12.1 09/19/2011   HCT 34.5* 09/19/2011   MCV 97.2 09/19/2011   PLT 287 09/19/2011     Lab 09/19/11 0612 09/15/11 1800  NA 131* --  K 3.2* --  CL 94* --  CO2 23 --  BUN 14 --  CREATININE 0.72 --  CALCIUM 9.9 --  PROT -- 6.6  BILITOT -- 0.3  ALKPHOS -- 63  ALT -- 16  AST -- 16  GLUCOSE 108* --   Lab Results  Component Value Date   TROPONINI <0.30 09/15/2011   No results found for this basename: CHOL   No results found for this basename: HDL   No results found for this basename: LDLCALC   No results found for this basename: TRIG   No results found for this basename: CHOLHDL   No results found for this basename: DDIMER     Radiology/Studies: Dg Chest 2 View  09/15/2011  *RADIOLOGY REPORT*  Clinical Data: Lies weakness  CHEST - 2 VIEW  Comparison: 08/04/2011  Findings: Mild cardiomegaly.  Left midlung calcified granuloma. There are ill-defined opacities at the left base worrisome for patchy airspace disease.  Lungs are otherwise clear.  Pulmonary vascularity is within normal limits.  IMPRESSION: Patchy opacities at the left base worrisome for airspace disease. Follow-up radiographs until resolution are recommended.  Original Report Authenticated By: Donavan Burnet, M.D.   Ct Head Wo Contrast  09/15/2011  *RADIOLOGY REPORT*  Clinical Data: Numbness.  Lightheadedness.  CT HEAD WITHOUT CONTRAST  Technique:  Contiguous axial images were  obtained from the base of the skull through the vertex without contrast.  Comparison: None.  Findings: Global atrophy.  Chronic ischemic changes in the periventricular white matter.  No mass effect, midline shift, or acute intracranial hemorrhage.  Mastoid air cells are clear. Visualized paranasal sinuses are clear.  Cranium is intact.  IMPRESSION: No acute intracranial pathology.  Chronic changes.  Original Report Authenticated By: Donavan Burnet, M.D.   Mr Brain Wo Contrast  09/18/2011  *RADIOLOGY REPORT*  Clinical Data: 77 year old female with confusion, dysphasia, unsteady gait.  The patient reports an allergy to contrast (listed in the medical record is iodinated contrast) and declined gadolinium contrast for this exam.  MRI HEAD WITHOUT CONTRAST  Technique:  Multiplanar, multiecho pulse sequences of the brain and surrounding structures were obtained according to standard protocol without intravenous contrast.  Comparison: Head CTs 09/15/2011 and earlier. Neck CT 05/13/2011.  Findings: No restricted diffusion to suggest acute infarction.  No midline shift, mass effect, evidence of mass lesion, ventriculomegaly, extra-axial collection or acute intracranial hemorrhage.  Cervicomedullary junction and pituitary are within normal limits.  Major intracranial vascular flow voids are preserved with a degree of intracranial artery dolichoectasia.  Scattered chronic micro hemorrhages, mostly in the frontal lobe subcortical white matter (series 7 image 12, 13).  Confluent periventricular and central cerebral white matter T2 and FLAIR hyperintensity.  Comparatively mild T2 hyperintensity in the pons. Deep gray matter nuclei and cerebellum within normal limits for age.  Negative visualized cervical spine. Visualized bone marrow signal is within normal limits.  Grossly normal visualized internal auditory structures.  Postoperative changes to the globes. Visualized paranasal sinuses and mastoids are clear.  A dark T1 and  T2 signal round mass in the superficial lobe of the right parotid gland is partially visible measuring 15 x 19 x 13 mm. This is stable since 05/13/2011.  IMPRESSION: 1. No acute intracranial abnormality.  Chronic small vessel disease. 2.  Right parotid gland mass is stable since 05/13/2011, but decreased T2 signal is noted and tumors with low signal intensity on T2-weighted images are more likely to be malignant even when they  are well-demarcated.  Recommend ENT follow-up.  Original Report Authenticated By: Ulla Potash III, M.D.    EKG: Electrocardiogram demonstrates sinus rhythm at about 110 with intervals of 0.22/0.09/0.43.  Telemetry strip demonstrates complete heart block and sinus node dysfunction occurring simultaneously.   Patient Active Hospital Problem List: Complete heart block (09/19/2011)   Assessment: The patient developed complete heart block and sinus node dysfunction simultaneously. These were associated with the abrupt onset of syncope and seizures. The patient has never had this before her consistent with the observation that the simultaneous occurrence is likely secondary to a vagatonic trigger as opposed to a primary cardiac event. As such, I would not recommend pacing but rather pursuing a likely GI/GU for her which resulted in the hyper vagal tone. It is reasonable to maintain a temporary transvenous pacemaker for now. I have discussed this with the primary care team.  Other considerations would include ischemia which is unlikely based on the electrocardiogram but the presence of which needs to be considered given her peripheral vascular disease. I also don't know whether this might be an atypical presentation related to her triple A; perhaps vascular input would be of help.    Plan: As above  Sinus bradycardia (09/19/2011)   Assessment: As above    Plan: As above  Hypokalemia (09/19/2011)   Assessment: Needs replacement    Plan:            Signed, Sherryl Manges  MD

## 2011-09-19 NOTE — Significant Event (Signed)
Rapid Response Event Note  Overview: Time Called: 1252 Arrival Time: 1253 Event Type: Cardiac  Initial Focused Assessment: Called to patient bedside because symptomatic bradycardia with pause.  Upon arrival patient pale, clammy, feels weak.  Rhythm Sinus Tach on monitor post dose of Epinephrine. Zoll monitor applied.  Lung sounds clear, decreased in bases.  Heart tones regular.  Patient Alert, but confused.  Per RN patient has been confused.     Interventions: Heart rated decreased into the 40-60s twice while on the Zoll monitor.  Rhythm: Complete heart block. 12 Lead EKG done. PIV started by IV therapy, Left Hand NS @ 100cc/hr Dopamine started at 46mcg/kg/min via PIV per Dr Myrtis Ser orders.  Patient transported to cath lab with Zoll monitor and O2.  Staff at bedside in cath lab.  Updated on patient status.   Event Summary: Name of Physician Notified: dr Jomarie Longs at bedside at 1252 (physician at bedside upon arrival)  Name of Consulting Physician Notified: Dr Myrtis Ser at 1300  Outcome: Transferred (Comment)  Event End Time: 1340  Marcellina Millin

## 2011-09-19 NOTE — Consult Note (Signed)
CARDIOLOGY CONSULT NOTE  Patient ID: Paula [name] MRN: [id number] DOB/AGE: 1927-05-11 77 y.o.  Admit date: 09/15/2011 Referring Physician Primary PhysicianRAMACHANDRAN,AJITH, MD, MD Primary Cardiologist New.... Myrtis Ser Reason for Consultation  Bradycardia, high degree AV block, hypotension HPI:      The patient had been embedded to the hospital with fatigue and malaise. She was monitored and her rhythm was stable. She was treated it appeared to be ready to go home today. There is question that she may have fallen during the evening last night. The nurses had not been alerted and there was no other information about this. The patient was being prepared to go home today. She was sitting on the side of the bed and her husband and others were in the room. She had a syncopal episode. The monitor shows high degree AV block with complete heart block with greater than 7 seconds of non-conducted P waves. Patient was immediately placed in bed and stabilize. She had more bradycardia and was given atropine. Her heart rate increased. Since then she's had some complete heart block but no further prolonged pauses. She was seen immediately and external pacing pads were placed. She's not giving any history of chest pain. Her EKG does not show any diagnostic ST changes.  Patient denies fever, chills, headache, sweats, rash, change in vision, change in hearing, chest pain, cough, nausea vomiting, urinary symptoms. All other systems are reviewed and are negative.  Past Medical History  Diagnosis Date  . Diabetes mellitus   . Thyroid disease   . Abdominal aneurysm   . COPD (chronic obstructive pulmonary disease)   . Hypertension   . Shortness of breath   . Hypothyroidism   . Depression   . Recurrent upper respiratory infection (URI)     Family History  Problem Relation Age of Onset  . Cancer Father     History   Social History  . Marital Status: Married    Spouse Name: N/A    Number of Children: N/A  .  Years of Education: N/A   Occupational History  . Not on file.   Social History Main Topics  . Smoking status: Former Smoker    Quit date: 08/31/2011  . Smokeless tobacco: Not on file  . Alcohol Use: No  . Drug Use: No  . Sexually Active:    Other Topics Concern  . Not on file   Social History Narrative  . No narrative on file    Past Surgical History  Procedure Date  . Cholecystectomy   . Joint replacement   . Sinus surgery with instatrak   . Appendectomy   . Oophorectomy   . Arthroscopic repair acl      Prescriptions prior to admission  Medication Sig Dispense Refill  . ALPRAZolam (XANAX) 0.25 MG tablet Take 0.25 mg by mouth at bedtime as needed. TAKES 1 TAB IN THE MORNING IN 2 TABS IN THE EVENING      . amitriptyline (ELAVIL) 10 MG tablet Take 10 mg by mouth at bedtime.        Marland Kitchen buPROPion (WELLBUTRIN) 100 MG tablet Take 100 mg by mouth daily.       . calcium carbonate (OS-CAL) 600 MG TABS Take 600 mg by mouth daily.        . cholecalciferol (VITAMIN D) 1000 UNITS tablet Take 1,000 Units by mouth daily.        . hydrochlorothiazide (HYDRODIURIL) 25 MG tablet Take 25 mg by mouth daily.        Marland Kitchen  levothyroxine (SYNTHROID, LEVOTHROID) 100 MCG tablet Take 100 mcg by mouth daily.        . Lutein 10 MG TABS Take 1 tablet by mouth daily.        Marland Kitchen DISCONTD: perphenazine (TRILAFON) 4 MG tablet Take 2 mg by mouth every other day.         Physical Exam: I first saw the patient she was pale. There was no definite diaphoresis. Over the next few minutes her blood pressure stabilized and she looked better. Head is atraumatic. There is no xanthelasma. There is no jugulovenous distention. Lungs reveal scattered rhonchi. Cardiac exam reveals S1 and S2. No clicks or significant murmurs. The abdomen is soft. There is no significant peripheral edema. There no musculoskeletal deformities. There are no skin rashes.  Labs:   Lab Results  Component Value Date   WBC 8.4 09/19/2011   HGB 12.1  09/19/2011   HCT 34.5* 09/19/2011   MCV 97.2 09/19/2011   PLT 287 09/19/2011    Lab 09/19/11 0612 09/15/11 1800  NA 131* --  K 3.2* --  CL 94* --  CO2 23 --  BUN 14 --  CREATININE 0.72 --  CALCIUM 9.9 --  PROT -- 6.6  BILITOT -- 0.3  ALKPHOS -- 63  ALT -- 16  AST -- 16  GLUCOSE 108* --   Lab Results  Component Value Date   TROPONINI <0.30 09/15/2011    No results found for this basename: CHOL   No results found for this basename: HDL   No results found for this basename: LDLCALC   No results found for this basename: TRIG   No results found for this basename: CHOLHDL   No results found for this basename: LDLDIRECT      Radiology: Dg Chest 2 View  09/15/2011  *RADIOLOGY REPORT*  Clinical Data: Lies weakness  CHEST - 2 VIEW  Comparison: 08/04/2011  Findings: Mild cardiomegaly.  Left midlung calcified granuloma. There are ill-defined opacities at the left base worrisome for patchy airspace disease.  Lungs are otherwise clear.  Pulmonary vascularity is within normal limits.  IMPRESSION: Patchy opacities at the left base worrisome for airspace disease. Follow-up radiographs until resolution are recommended.  Original Report Authenticated By: Donavan Burnet, M.D.   Ct Head Wo Contrast  09/15/2011  *RADIOLOGY REPORT*  Clinical Data: Numbness.  Lightheadedness.  CT HEAD WITHOUT CONTRAST  Technique:  Contiguous axial images were obtained from the base of the skull through the vertex without contrast.  Comparison: None.  Findings: Global atrophy.  Chronic ischemic changes in the periventricular white matter.  No mass effect, midline shift, or acute intracranial hemorrhage.  Mastoid air cells are clear. Visualized paranasal sinuses are clear.  Cranium is intact.  IMPRESSION: No acute intracranial pathology.  Chronic changes.  Original Report Authenticated By: Donavan Burnet, M.D.   Mr Brain Wo Contrast  09/18/2011  *RADIOLOGY REPORT*  Clinical Data: 77 year old female with confusion,  dysphasia, unsteady gait.  The patient reports an allergy to contrast (listed in the medical record is iodinated contrast) and declined gadolinium contrast for this exam.  MRI HEAD WITHOUT CONTRAST  Technique:  Multiplanar, multiecho pulse sequences of the brain and surrounding structures were obtained according to standard protocol without intravenous contrast.  Comparison: Head CTs 09/15/2011 and earlier. Neck CT 05/13/2011.  Findings: No restricted diffusion to suggest acute infarction.  No midline shift, mass effect, evidence of mass lesion, ventriculomegaly, extra-axial collection or acute intracranial hemorrhage.  Cervicomedullary junction and pituitary  are within normal limits.  Major intracranial vascular flow voids are preserved with a degree of intracranial artery dolichoectasia.  Scattered chronic micro hemorrhages, mostly in the frontal lobe subcortical white matter (series 7 image 12, 13).  Confluent periventricular and central cerebral white matter T2 and FLAIR hyperintensity.  Comparatively mild T2 hyperintensity in the pons. Deep gray matter nuclei and cerebellum within normal limits for age.  Negative visualized cervical spine. Visualized bone marrow signal is within normal limits.  Grossly normal visualized internal auditory structures.  Postoperative changes to the globes. Visualized paranasal sinuses and mastoids are clear.  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 This is stable since 05/13/2011.  IMPRESSION: 1. No acute intracranial abnormality.  Chronic small vessel disease. 2.  Right parotid gland mass is stable since 05/13/2011, but decreased T2 signal is noted and tumors with low signal intensity on T2-weighted images are more likely to be malignant even when they are well-demarcated.  Recommend ENT follow-up.  Original Report Authenticated By: Ulla Potash III, M.D.   EKG: EKG currently reveals  sinus tachycardia after she received atropine. There no significant QRS changes.  ASSESSMENT AND PLAN:   The primary problem at this time is marked high degree AV block. She has felt weak as an outpatient and I suspect she's been having intermittent episodes of this. At this time today she is having prolonged episodes of bradycardia. She is currently being moved to have a temporary pacemaker placed. We will obtain a limited 2-D echo and then most probably proceed with permanent pacing today.  At this time I am not able to document a TSH during this hospitalization. There appears to be no older records.    Signed:  Willa Rough    09/19/2011, 1:37 PM

## 2011-09-19 NOTE — Discharge Summary (Addendum)
Physician Discharge Summary  Patient ID: Paula [name] MRN: [id number] DOB/AGE: 14-Feb-1927 77 y.o.  Admit date: 09/15/2011 Discharge date: 09/19/2011  Primary Care Physician:  Paula Fick, MD, MD ENT: Paula [name] Vascular Surgeon: Paula [name]   Discharge Diagnoses:   1.Pneumonia, community acquired 2.UTI (urinary tract infection) 3.Episode of dizziness 4.Parotid mass followed by Paula [name] 5.Hypothyroidism 6. Mild cognitive dysfunction 7. Abdominal aortic aneurysm followed by Paula [name] 8. COPD 9. Diabetes Mellitus  Current Discharge Medication List    START taking these medications   Details  levofloxacin (LEVAQUIN) 500 MG tablet Take 1 tablet (500 mg total) by mouth daily. Qty: 4 tablet, Refills: 0      CONTINUE these medications which have NOT CHANGED   Details  ALPRAZolam (XANAX) 0.25 MG tablet Take 0.25 mg by mouth at bedtime as needed. TAKES 1 TAB IN THE MORNING IN 2 TABS IN THE EVENING    amitriptyline (ELAVIL) 10 MG tablet Take 10 mg by mouth at bedtime.      buPROPion (WELLBUTRIN) 100 MG tablet Take 100 mg by mouth daily.     calcium carbonate (OS-CAL) 600 MG TABS Take 600 mg by mouth daily.      cholecalciferol (VITAMIN D) 1000 UNITS tablet Take 1,000 Units by mouth daily.      hydrochlorothiazide (HYDRODIURIL) 25 MG tablet Take 25 mg by mouth daily.      levothyroxine (SYNTHROID, LEVOTHROID) 100 MCG tablet Take 100 mcg by mouth daily.      Lutein 10 MG TABS Take 1 tablet by mouth daily.        STOP taking these medications     perphenazine (TRILAFON) 4 MG tablet        Disposition and Follow-up: Paula [name] in 1 week  Significant Diagnostic Studies:  Dg Chest 2 View  09/15/2011  *RADIOLOGY REPORT*  Clinical Data: Lies weakness  CHEST - 2 VIEW  Comparison: 08/04/2011  Findings: Mild cardiomegaly.  Left midlung calcified granuloma. There are ill-defined opacities at the left base worrisome for patchy airspace disease.  Lungs are otherwise clear.   Pulmonary vascularity is within normal limits.  IMPRESSION: Patchy opacities at the left base worrisome for airspace disease. Follow-up radiographs until resolution are recommended.  Original Report Authenticated By: Paula [name], M.D.   Ct Head Wo Contrast  09/15/2011  *RADIOLOGY REPORT*  Clinical Data: Numbness.  Lightheadedness.  CT HEAD WITHOUT CONTRAST  Technique:  Contiguous axial images were obtained from the base of the skull through the vertex without contrast.  Comparison: None.  Findings: Global atrophy.  Chronic ischemic changes in the periventricular white matter.  No mass effect, midline shift, or acute intracranial hemorrhage.  Mastoid air cells are clear. Visualized paranasal sinuses are clear.  Cranium is intact.  IMPRESSION: No acute intracranial pathology.  Chronic changes.  Original Report Authenticated By: Paula [name], M.D.    Brief H and P: HPI: 77 y/o cauc female who who notice increase weakness and unsteadiness over the last 3 days.  She denies any nausea,vomiting fever or chills. She denied any chest pain ,palpitation , Headache, or focal weakness. She complained of Dyspnea on exertion  Hospital Course:  Principal Problem: 1. *Pneumonia: Community acquired: Was started on IV Levaquin, with a transition to by mouth, clinically improved from a respiratory standpoint. Active Problems:  2.UTI (urinary tract infection): Also continued on Levaquin for this, unfortunately urine cultures were not done admission and hence will of limited value of several days of the antibiotics but has clinically improved as well. 3.  Episode of dizziness:unclear etiology,no events on tele for 3days. She had a head CT and MRI of her brain which did not show any posterior circulation lesion or strokes.has also been seen by physical therapy and occupational therapy and is going to be discharged home with home health physical therapy followup.  4. Parotid mass: This was followed by Paula [name] with  ENT, her MRI that she had in the hospital confirmed this parotid mass lesion with differ further management to Paula [name] i.e. need for biopsy etc.  5. mild cognitive dysfunction versus early dementia : She did have episodes of delirium in the hospital, and I think hospitalization may have uncovered early dementia which was compensated in her home environment. Also have home occupational therapy followup     Addendum: Ms Paula [name] was ready to be discharged her tele was disconnected, she stood up got dizzy, then laid back in bed again. Her orthostatics were checked and negative, subsequently stood up and passed out and fell backwards,Regained consciousness in a few seconds, her vitals are stable again. Subsequently tele showed asystole, long sinus pauses. Given 1mg  of Epi Requested STat cardiology consult, Paula [name] responded quickly, plan would be to have a pacemaker, in addition her husband also reports now that she fell backwards yesterday and may have hurt her head, but denies headache or any other neurological symptoms, will Tx to ICU

## 2011-09-19 NOTE — Op Note (Addendum)
Cardiac Cath Procedure Note:  Indication: Complete heart block/symptomatic bradycardia  Procedures performed:  1) Transvenous pacemaker  Description of procedure:   The risks and indication of the procedure were explained to patient and her husband. Emergent consent obtained.   An appropriate timeout was taken prior to the procedure. The right neck was prepped and draped in the routine sterile fashion and anesthetized with 1% local lidocaine.   A 6 FR venous sheath was placed in the right internal jugular vein using a modified Seldinger technique. A transvenous pacing wire was placed in the base of the RV under fluoroscopic guidance. Capture was confirmed with threshold < 0.4. Back-up rate set at 70 with mA of 4.  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   Assessment: Successful transvenous pacer for high-grade heart block/symptomatic bradycardia with 8-sec pauses.   Plan/Discussion: Will be seen by EP for consideration of PPM. Give regitine subQ for dopamine infiltration. Watch hand closely.  Daniel Bensimhon 2:10 PM

## 2011-09-19 NOTE — Progress Notes (Signed)
  Spoke with spouse, he chose [facility] for hhpt/ot from agency list.  Patient is having a pacemaker today, she is for possible discharge 11/9 or 11/10.  Soc will begin 24- 48 hrs post discharge.

## 2011-09-19 NOTE — H&P (View-Only) (Signed)
Subjective: Still dizzy this a.m. Took all her medicines together and attributes it to that  Objective: Vital signs in last 24 hours: Temp:  [98.8 F (37.1 C)-99.1 F (37.3 C)] 99.1 F (37.3 C) (11/07 0532) Pulse Rate:  [90-98] 90  (11/07 0532) Resp:  [18] 18  (11/07 0532) BP: (163-166)/(81-91) 163/81 mmHg (11/07 0532) SpO2:  [93 %-94 %] 94 % (11/07 0532) Weight:  [75.388 kg (166 lb 3.2 oz)] 166 lb 3.2 oz (75.388 kg) (11/07 0532) Weight change: -0.312 kg (-11 oz) Last BM Date: 09/14/11  Intake/Output from previous day: 11/06 0701 - 11/07 0700 In: 2280 [P.O.:1080; I.V.:1200] Out: 750 [Urine:750]     Physical Exam: General: Alert, awake, oriented x3, in no acute distress. HEENT: No bruits, no goiter. Heart: Regular rate and rhythm, without murmurs, rubs, gallops. Lungs: Clear to auscultation bilaterally. Abdomen: Soft, nontender, nondistended, positive bowel sounds. Extremities: No clubbing cyanosis or edema with positive pedal pulses. Neuro: Grossly intact, nonfocal.    Lab Results: Basic Metabolic Panel:  Basename 09/16/11 1105 09/16/11 0440 09/15/11 1800  NA 141 -- 140  K 3.7 -- 3.2*  CL 105 -- 101  CO2 27 -- 27  GLUCOSE 111* -- 135*  BUN 9 -- 13  CREATININE 0.73 0.75 --  CALCIUM 9.6 -- 10.3  MG -- -- --  PHOS -- -- --   Liver Function Tests:  Basename 09/15/11 1800  AST 16  ALT 16  ALKPHOS 63  BILITOT 0.3  PROT 6.6  ALBUMIN 3.6    Basename 09/15/11 1800  LIPASE 60*  AMYLASE --   No results found for this basename: AMMONIA:2 in the last 72 hours CBC:  Basename 09/16/11 1105 09/16/11 0440 09/15/11 1800  WBC 8.7 7.4 --  NEUTROABS -- -- 5.4  HGB 11.7* 10.6* --  HCT 34.8* 32.6* --  MCV 100.6* 101.9* --  PLT 274 284 --   Cardiac Enzymes:  Basename 09/15/11 1800  CKTOTAL --  CKMB --  CKMBINDEX --  TROPONINI <0.30   BNP: No results found for this basename: POCBNP:3 in the last 72 hours D-Dimer: No results found for this basename:  DDIMER:2 in the last 72 hours CBG:  Basename 09/18/11 1048 09/18/11 0612 09/17/11 2119 09/17/11 1640 09/17/11 1225 09/17/11 0705  GLUCAP 110* 124* 135* 129* 117* 146*   Hemoglobin A1C: No results found for this basename: HGBA1C in the last 72 hours Fasting Lipid Panel: No results found for this basename: CHOL,HDL,LDLCALC,TRIG,CHOLHDL,LDLDIRECT in the last 72 hours Thyroid Function Tests: No results found for this basename: TSH,T4TOTAL,FREET4,T3FREE,THYROIDAB in the last 72 hours Anemia Panel: No results found for this basename: VITAMINB12,FOLATE,FERRITIN,TIBC,IRON,RETICCTPCT in the last 72 hours Coagulation: No results found for this basename: LABPROT:2,INR:2 in the last 72 hours Urine Drug Screen:  Alcohol Level: No results found for this basename: ETH:2 in the last 72 hours Urinalysis:  Misc. Labs:  No results found for this or any previous visit (from the past 240 hour(s)).  Studies/Results: Mr Brain Wo Contrast  09/18/2011  *RADIOLOGY REPORT*  Clinical Data: 77-year-old female with confusion, dysphasia, unsteady gait.  The patient reports an allergy to contrast (listed in the medical record is iodinated contrast) and declined gadolinium contrast for this exam.  MRI HEAD WITHOUT CONTRAST  Technique:  Multiplanar, multiecho pulse sequences of the brain and surrounding structures were obtained according to standard protocol without intravenous contrast.  Comparison: Head CTs 09/15/2011 and earlier. Neck CT 05/13/2011.  Findings: No restricted diffusion to suggest acute infarction.  No midline shift,   mass effect, evidence of mass lesion, ventriculomegaly, extra-axial collection or acute intracranial hemorrhage.  Cervicomedullary junction and pituitary are within normal limits.  Major intracranial vascular flow voids are preserved with a degree of intracranial artery dolichoectasia.  Scattered chronic micro hemorrhages, mostly in the frontal lobe subcortical white matter (series 7 image  12, 13).  Confluent periventricular and central cerebral white matter T2 and FLAIR hyperintensity.  Comparatively mild T2 hyperintensity in the pons. Deep gray matter nuclei and cerebellum within normal limits for age.  Negative visualized cervical spine. Visualized bone marrow signal is within normal limits.  Grossly normal visualized internal auditory structures.  Postoperative changes to the globes. Visualized paranasal sinuses and mastoids are clear.  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 This is stable since 05/13/2011.  IMPRESSION: 1. No acute intracranial abnormality.  Chronic small vessel disease. 2.  Right parotid gland mass is stable since 05/13/2011, but decreased T2 signal is noted and tumors with low signal intensity on T2-weighted images are more likely to be malignant even when they are well-demarcated.  Recommend ENT follow-up.  Original Report Authenticated By: H.LEE HALL III, M.D.    Medications: Scheduled Meds:   . ALPRAZolam  0.25 mg Oral QHS  . amitriptyline  10 mg Oral QHS  . buPROPion  100 mg Oral Daily  . calcium carbonate  1 tablet Oral Daily  . cholecalciferol  1,000 Units Oral Daily  . docusate sodium  100 mg Oral BID  . enoxaparin  40 mg Subcutaneous Q24H  . guaiFENesin  600 mg Oral BID  . hydrochlorothiazide  25 mg Oral Daily  . levofloxacin (LEVAQUIN) IV  750 mg Intravenous Q24H  . levothyroxine  100 mcg Oral Daily  . perphenazine  2 mg Oral QODAY   Continuous Infusions:   . sodium chloride 50 mL/hr at 09/17/11 1130   PRN Meds:.acetaminophen, acetaminophen, albuterol, HYDROcodone-acetaminophen, ipratropium, ondansetron (ZOFRAN) IV, ondansetron, senna  Assessment/Plan:  Principal Problem:  1.*Pneumonia, organism unspecified: Continue Levaquin day 3 of 7, febrile respiratory status improved and leukocytosis resolved. Active Problems:  2.UTI (urinary tract  infection): Urine culture is still pending of limited value now since has been on antibiotics for 2 days  3.Episode of dizziness: Check orthostatics, cut down and her amitriptyline dose since this can cause orthostatic hypotension. Arm MRI brain as ordered per Dr. Matthews to evaluate this is unremarkable and carotid duplex is normal as well. Await PT OT evaluation.  4. Parotid mass: Followed by Dr. Rosen with [facility] tomorrow if stable, will likely need home health services    LOS: 3 days   Paula [name] 09/18/2011, 1:02 PM   

## 2011-09-19 NOTE — Interval H&P Note (Signed)
History and Physical Interval Note:   09/19/2011   1:45 PM   Paula [name]  has presented today for surgery, with the diagnosis of sick senus sydrome  The various methods of treatment have been discussed with the patient and family. After consideration of risks, benefits and other options for treatment, the patient has consented to  Procedure(s): TEMPORARY PACEMAKER INSERTION as a surgical intervention .  The patients' history has been reviewed, patient examined. Currently with multiple episodes of complete heart block an prolonged pauses. Here for emergent transvenous pacer.   Arvilla Meres  MD

## 2011-09-19 NOTE — Progress Notes (Signed)
Name: Paula [name] MRN: [id number] DOB: [birth year]  DOS:   09/19/11   CRITICAL CARE MEDICINE ADMISSION / CONSULTATION NOTE  Referring Physician  [facility]  Reason For Consult  Confusion, AV block    Patient Description  77 y/o female with DM, COPD was admitted on 11/4 for weakness, unsteadiness, shortness of breath. Treated with levaquin for a UTI and pneumonia.  Fell night of 11/7 and hit head, family did not tell nursing.  On AM of 11/8, stood up, felt dizzy --> fell --> sick sinus syndrome with long pauses --> atropine and transcutaenously paced then temp pacer placed.  We are consulted for post procedure agitation.    Location Start Stop  Sheath R IJ 11/8    Culture Date Result  urine 11/4 No growth   Antibiotic Indication Start Stop  Levaquin 11/4 11/8    GI Prophylaxis DVT Prophylaxis  Not indicated Sq hep     Protocols     Consultants  cardiology     Date Imaging Studies  11/4 11/8 CXR Left lung base CT head >>   Date Events  11/8 Temp pacer     History Of Present Illness   77 y/o female with DM, COPD was admitted on 11/4 for weakness, unsteadiness, shortness of breath. Treated with levaquin for a UTI and pneumonia.  Fell night of 11/7 and hit head, family did not tell nursing.  On AM of 11/8, stood up, felt dizzy --> fell --> questionable seizure --> sick sinus syndrome with long pauses --> atropine and transcutaenously paced then temp pacer placed.  We are consulted for post procedure agitation.  Past Medical History  Diagnosis Date  . Diabetes mellitus   . Thyroid disease   . Abdominal aneurysm   . COPD (chronic obstructive pulmonary disease)   . Hypertension   . Shortness of breath   . Hypothyroidism   . Depression   . Recurrent upper respiratory infection (URI)     Past Surgical History  Procedure Date  . Cholecystectomy   . Joint replacement   . Sinus surgery with instatrak   . Appendectomy   . Oophorectomy   . Arthroscopic repair acl      Prior to Admission medications   Medication Sig Start Date End Date Taking? Authorizing Provider  ALPRAZolam (XANAX) 0.25 MG tablet Take 0.25 mg by mouth at bedtime as needed. TAKES 1 TAB IN THE MORNING IN 2 TABS IN THE EVENING   Yes Historical Provider, MD  amitriptyline (ELAVIL) 10 MG tablet Take 10 mg by mouth at bedtime.     Yes Historical Provider, MD  buPROPion (WELLBUTRIN) 100 MG tablet Take 100 mg by mouth daily.    Yes Historical Provider, MD  calcium carbonate (OS-CAL) 600 MG TABS Take 600 mg by mouth daily.     Yes Historical Provider, MD  cholecalciferol (VITAMIN D) 1000 UNITS tablet Take 1,000 Units by mouth daily.     Yes Historical Provider, MD  hydrochlorothiazide (HYDRODIURIL) 25 MG tablet Take 25 mg by mouth daily.     Yes Historical Provider, MD  levothyroxine (SYNTHROID, LEVOTHROID) 100 MCG tablet Take 100 mcg by mouth daily.     Yes Historical Provider, MD  Lutein 10 MG TABS Take 1 tablet by mouth daily.     Yes Historical Provider, MD  levofloxacin (LEVAQUIN) 500 MG tablet Take 1 tablet (500 mg total) by mouth daily. 09/19/11 09/22/11  Preetha Joseph    Allergies  Allergen Reactions  . Sulfa Antibiotics Anaphylaxis  .  Contrast Media (Iodinated Diagnostic Agents) Swelling  . Iohexol      Desc: PT STATES SHE HAD A SEVERE REACTION TO IV CONRAST WITH THROAT SWELLING AND SOB. SHE WAS ADMITTED TO THE HOSPITAL. SHE HAS NEVER HAD CONTRAST AGAIN.   Marland Kitchen Penicillins Rash    Family History  Problem Relation Age of Onset  . Cancer Father     Social History  reports that she quit smoking about 2 weeks ago. She does not have any smokeless tobacco history on file. She reports that she does not drink alcohol or use illicit drugs.  Review Of Systems   Cannot obtain due to confusion  Physical Examination  BP 130/71  Pulse 111  Temp(Src) 98.2 F (36.8 C) (Oral)  Resp 20  Ht 5\' 6"  (1.676 m)  Wt 73.846 kg (162 lb 12.8 oz)  BMI 26.28 kg/m2  SpO2 99%  Gen: confused,  agitated HEENT: NCAT, no bruising, PERRL, EOMi, OP clear, PULM: CTA B CV: tachy, regular, no mgr, no JVD AB: BS+, soft, nontender, no hsm Ext: warm, no edema, no clubbing, no cyanosis Derm: no rash or skin breakdown Neuro: Awake, confused, repeatedly asks my name, not oriented to location or situation or year, CN II-XII intact, strength 5/5 in all 4 extremeties  Labs  Results for orders placed during the hospital encounter of 09/15/11 (from the past 24 hour(s))  CBC     Status: Abnormal   Collection Time   09/19/11  6:12 AM      Component Value Range   WBC 8.4  4.0 - 10.5 (K/uL)   RBC 3.55 (*) 3.87 - 5.11 (MIL/uL)   Hemoglobin 12.1  12.0 - 15.0 (g/dL)   HCT 16.1 (*) 09.6 - 46.0 (%)   MCV 97.2  78.0 - 100.0 (fL)   MCH 34.1 (*) 26.0 - 34.0 (pg)   MCHC 35.1  30.0 - 36.0 (g/dL)   RDW 04.5  40.9 - 81.1 (%)   Platelets 287  150 - 400 (K/uL)  BASIC METABOLIC PANEL     Status: Abnormal   Collection Time   09/19/11  6:12 AM      Component Value Range   Sodium 131 (*) 135 - 145 (mEq/L)   Potassium 3.2 (*) 3.5 - 5.1 (mEq/L)   Chloride 94 (*) 96 - 112 (mEq/L)   CO2 23  19 - 32 (mEq/L)   Glucose, Bld 108 (*) 70 - 99 (mg/dL)   BUN 14  6 - 23 (mg/dL)   Creatinine, Ser 9.14  0.50 - 1.10 (mg/dL)   Calcium 9.9  8.4 - 78.2 (mg/dL)   GFR calc non Af Amer 78 (*) >90 (mL/min)   GFR calc Af Amer >90  >90 (mL/min)  GLUCOSE, CAPILLARY     Status: Abnormal   Collection Time   09/19/11  6:35 AM      Component Value Range   Glucose-Capillary 125 (*) 70 - 99 (mg/dL)   Comment 1 Notify RN     Comment 2 Documented in Chart    GLUCOSE, CAPILLARY     Status: Abnormal   Collection Time   09/19/11 11:28 AM      Component Value Range   Glucose-Capillary 116 (*) 70 - 99 (mg/dL)   Comment 1 Notify RN     Comment 2 Documented in Chart    GLUCOSE, CAPILLARY     Status: Abnormal   Collection Time   09/19/11  2:47 PM      Component Value Range  Glucose-Capillary 131 (*) 70 - 99 (mg/dL)  GLUCOSE,  CAPILLARY     Status: Abnormal   Collection Time   09/19/11  4:25 PM      Component Value Range   Glucose-Capillary 122 (*) 70 - 99 (mg/dL)  MRSA PCR SCREENING     Status: Normal   Collection Time   09/19/11  4:36 PM      Component Value Range   MRSA by PCR NEGATIVE  NEGATIVE   CBC     Status: Abnormal   Collection Time   09/19/11  4:48 PM      Component Value Range   WBC 13.8 (*) 4.0 - 10.5 (K/uL)   RBC 3.43 (*) 3.87 - 5.11 (MIL/uL)   Hemoglobin 11.6 (*) 12.0 - 15.0 (g/dL)   HCT 16.1 (*) 09.6 - 46.0 (%)   MCV 97.1  78.0 - 100.0 (fL)   MCH 33.8  26.0 - 34.0 (pg)   MCHC 34.8  30.0 - 36.0 (g/dL)   RDW 04.5  40.9 - 81.1 (%)   Platelets 279  150 - 400 (K/uL)  CREATININE, SERUM     Status: Abnormal   Collection Time   09/19/11  4:48 PM      Component Value Range   Creatinine, Ser 0.70  0.50 - 1.10 (mg/dL)   GFR calc non Af Amer 79 (*) >90 (mL/min)   GFR calc Af Amer >90  >90 (mL/min)  GLUCOSE, CAPILLARY     Status: Abnormal   Collection Time   09/19/11  7:40 PM      Component Value Range   Glucose-Capillary 124 (*) 70 - 99 (mg/dL)   Comment 1 Documented in Chart     Comment 2 Notify RN      Imaging  Mr Brain Wo Contrast  09/18/2011  *RADIOLOGY REPORT*  Clinical Data: 77 year old female with confusion, dysphasia, unsteady gait.  The patient reports an allergy to contrast (listed in the medical record is iodinated contrast) and declined gadolinium contrast for this exam.  MRI HEAD WITHOUT CONTRAST  Technique:  Multiplanar, multiecho pulse sequences of the brain and surrounding structures were obtained according to standard protocol without intravenous contrast.  Comparison: Head CTs 09/15/2011 and earlier. Neck CT 05/13/2011.  Findings: No restricted diffusion to suggest acute infarction.  No midline shift, mass effect, evidence of mass lesion, ventriculomegaly, extra-axial collection or acute intracranial hemorrhage.  Cervicomedullary junction and pituitary are within normal limits.   Major intracranial vascular flow voids are preserved with a degree of intracranial artery dolichoectasia.  Scattered chronic micro hemorrhages, mostly in the frontal lobe subcortical white matter (series 7 image 12, 13).  Confluent periventricular and central cerebral white matter T2 and FLAIR hyperintensity.  Comparatively mild T2 hyperintensity in the pons. Deep gray matter nuclei and cerebellum within normal limits for age.  Negative visualized cervical spine. Visualized bone marrow signal is within normal limits.  Grossly normal visualized internal auditory structures.  Postoperative changes to the globes. Visualized paranasal sinuses and mastoids are clear.  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 This is stable since 05/13/2011.  IMPRESSION: 1. No acute intracranial abnormality.  Chronic small vessel disease. 2.  Right parotid gland mass is stable since 05/13/2011, but decreased T2 signal is noted and tumors with low signal intensity on T2-weighted images are more likely to be malignant even when they are well-demarcated.  Recommend ENT  follow-up.  Original Report Authenticated By: Harley Hallmark, M.D.    Assessment 77 y/o female admitted for pneumonia and UTI, treated with levaquin.  Her family states that she was somewhat confused during the hospitalization, then fell and hit her head PM of 11/7 (husband tells me tonight that he had not notified anyone of this previously).  On morning of 11/8 found to be in SSS with long pauses, had a temp pacer placed.  She now requires ICU care for delirium and post procedure monitoring.  Delirium (09/19/2011)   Assessment: Ddx due to infections, levaquin, sundowning, or fall last night (less likely sub dural).     Plan:  -stat head CT -prn haldol (not effective, limited in dosing due to elevated QTc) -repeat EKG to evaluate QTc -precedex if needed -frequent  orientation -avoid benzos/sedating meds -stop levaquin  Pneumonia, organism unspecified (09/15/2011)   Assessment: was improving clinically until today. Does not appear to have worsening respiratory status, but WBC up today.   Plan:  -continue antibiotics for pneumonia, but change to amoxicillin today -stop levaquin -repeat CXR -culture if febrile  UTI (urinary tract infection) (09/15/2011)   Assessment: does not have UTI   Plan:   Complete heart block (09/19/2011)   Assessment: Sick sinus syndrome with long pauses, now s/p pacer   Plan:  -f/u with cardiology for further recs  Sinus bradycardia (09/19/2011)   Assessment: s/p pacer   Plan:  -f/u cardiology recs  COPD   Assessment: not in exacerbation   Plan: -prn nebs   Best Practice Feeding: npo Analgesia: n/a Sedation: n/a Thromboprophylaxis: sq hep HOB >30 degrees Ulcer prophylaxis: n/a Glucose control: Cari Caraway, MD 09/19/2011, 10:01 PM

## 2011-09-19 NOTE — Progress Notes (Signed)
Pt. Working with PT when OT arrived.  OT attempted to work with patient, but patient adamantly refused.  Pt. Said "I am not going to practice in the bathroom and I am not brushing my teeth at the sink".  Pt. Tends to become combative secondary to cognitive status, therefore OT canceled OT session.  OT continues to recommend HHOT.

## 2011-09-19 NOTE — Progress Notes (Signed)
Physical Therapy Treatment Patient Details Name: Paula [name] MRN: [id number] DOB: 09-02-1927 Today's Date: 09/19/2011  PT Assessment/Plan  PT - Assessment/Plan Comments on Treatment Session: Pt tolerated treatment more than previous session, but still agitated.  At first pt refused to walk, said she could walk fine herself.  Pt finally agreed to ambulation and tolerated it well.  However, pt refused to walk stairs, said she would be able to use them fine at home.  With a second request to try 1-2 stairs, pt became increasingly agitated.  PT Plan: Frequency remains appropriate;Discharge plan remains appropriate PT Frequency: Min 3X/week Follow Up Recommendations: Home health PT Equipment Recommended: Rolling walker with 5" wheels PT Goals  Acute Rehab PT Goals PT Goal Formulation: With patient/family PT Goal: Sit to Supine/Side - Progress: Progressing toward goal PT Goal: Stand - Progress: Partly met PT Goal: Ambulate - Progress: Partly met PT Goal: Up/Down Stairs - Progress: Not met  PT Treatment Precautions/Restrictions  Precautions Precautions: Fall Restrictions Weight Bearing Restrictions: No Mobility (including Balance) Bed Mobility Bed Mobility: Yes Supine to Sit: 5: Supervision Supine to Sit Details (indicate cue type and reason): Supervision for pt safety.  Verbal cues for hand placement. Sitting - Scoot to Edge of Bed: 5: Supervision Sitting - Scoot to [place] of Bed Details (indicate cue type and reason): Verbal cues to initiate scoot. Sit to Supine - Left: 5: Supervision Sit to Supine - Left Details (indicate cue type and reason): Supervision for pt safety.  Cues to move both LE's to midline. Transfers Transfers: Yes Sit to Stand: 5: Supervision;From bed;With upper extremity assist Sit to Stand Details (indicate cue type and reason): Supervision for pt safety. Stand to Sit: 5: Supervision;With upper extremity assist;To bed Stand to Sit Details: Supervision for pt  safety. Ambulation/Gait Ambulation/Gait: Yes Ambulation/Gait Assistance: Other (comment) (Mingaurd A) Ambulation/Gait Assistance Details (indicate cue type and reason): Min gaurd A and RW to maintain pt safety.  Cues for proper RW use. Ambulation Distance (Feet): 200 Feet Assistive device: Rolling walker Gait Pattern: Trunk flexed;Step-through pattern Stairs: No (Pt refused) Wheelchair Mobility Wheelchair Mobility: No  Posture/Postural Control Posture/Postural Control: No significant limitations Balance Balance Assessed: Yes Static Standing Balance Static Standing - Level of Assistance: 6: Modified independent (Device/Increase time) (Pt able to maintain static standing balance with RW) Exercise    End of Session PT - End of Session Equipment Utilized During Treatment: Gait belt;Other (comment) (RW) Activity Tolerance: Treatment limited secondary to agitation Patient left: in bed;with call bell in reach;with family/visitor present Nurse Communication: Mobility status for ambulation General Behavior During Session: Agitated Cognition: Impaired, at baseline (Pt able to report location, but not reason / situation.) Cognitive Impairment: Pt became increasingly agitated with session.  Unable to recall why she was in the hospital.  Lacinda Axon 09/19/2011, 10:53 AM

## 2011-09-20 DIAGNOSIS — R9431 Abnormal electrocardiogram [ECG] [EKG]: Secondary | ICD-10-CM

## 2011-09-20 LAB — DIFFERENTIAL
Basophils Absolute: 0 10*3/uL (ref 0.0–0.1)
Basophils Relative: 0 % (ref 0–1)
Eosinophils Absolute: 0 10*3/uL (ref 0.0–0.7)
Eosinophils Relative: 1 % (ref 0–5)

## 2011-09-20 LAB — LACTIC ACID, PLASMA: Lactic Acid, Venous: 1 mmol/L (ref 0.5–2.2)

## 2011-09-20 LAB — BASIC METABOLIC PANEL
BUN: 15 mg/dL (ref 6–23)
Calcium: 9 mg/dL (ref 8.4–10.5)
GFR calc non Af Amer: 79 mL/min — ABNORMAL LOW (ref >90)
Glucose, Bld: 113 mg/dL — ABNORMAL HIGH (ref 70–99)
Sodium: 135 mEq/L (ref 135–145)

## 2011-09-20 LAB — CBC
MCH: 33.6 pg (ref 26.0–34.0)
MCV: 97.5 fL (ref 78.0–100.0)
Platelets: 251 10*3/uL (ref 150–400)
RDW: 13 % (ref 11.5–15.5)

## 2011-09-20 MED ORDER — SODIUM CHLORIDE 0.9 % IJ SOLN
INTRAMUSCULAR | Status: AC
  Administered 2011-09-20: 20:00:00 via INTRAVENOUS
  Filled 2011-09-20: qty 10

## 2011-09-20 MED ORDER — HALOPERIDOL LACTATE 5 MG/ML IJ SOLN
INTRAMUSCULAR | Status: AC
  Filled 2011-09-20: qty 1

## 2011-09-20 MED ORDER — HALOPERIDOL LACTATE 5 MG/ML IJ SOLN
3.0000 mg | Freq: Once | INTRAMUSCULAR | Status: AC
  Administered 2011-09-20: 3 mg via INTRAVENOUS

## 2011-09-20 MED ORDER — SODIUM CHLORIDE 0.9 % IJ SOLN
INTRAMUSCULAR | Status: AC
  Filled 2011-09-20: qty 10

## 2011-09-20 NOTE — Progress Notes (Signed)
Occupational Therapy Treatment Patient Details Name: Paula [name] MRN: [id number] DOB: 1927-02-11 Today's Date: 09/20/2011  Noted pt's decline in medical status, however pt's level of functioning and per RN pt. Still appropriate for therapy. Pt. Agreeable today for OOB activity and tolerated it well and stable throughout.    OT Assessment/Plan OT Assessment/Plan Comments on Treatment Session: Pt. completed ~150' with RW and hand held min assist to keep balance due to intermittent LOB however pt. able to correct self OT Plan: Discharge plan remains appropriate OT Frequency: Min 1X/week Follow Up Recommendations: Home health OT Equipment Recommended: None recommended by OT OT Goals Acute Rehab OT Goals OT Goal Formulation: With patient Time For Goal Achievement: 2 weeks ADL Goals Pt Will Perform Grooming: with set-up;Standing at sink ADL Goal: Grooming - Progress: Progressing toward goals Pt Will Transfer to Toilet: with supervision ADL Goal: Toilet Transfer - Progress: Partly met  OT Treatment Precautions/Restrictions  Precautions Precautions: Fall Restrictions Weight Bearing Restrictions: No   ADL ADL Eating/Feeding: Simulated;Independent Where Assessed - Eating/Feeding: Edge of bed Grooming: Performed;Wash/dry hands;Set up;Minimal assistance Grooming Details (indicate cue type and reason): Min Assist for balance while standing at sink Where Assessed - Grooming: Standing at sink Upper Body Bathing: Simulated;Set up Where Assessed - Upper Body Bathing: Sitting, bed;Unsupported Lower Body Bathing: Not assessed;Other (comment) Upper Body Dressing: Performed;Set up Upper Body Dressing Details (indicate cue type and reason): donned 2nd gown Where Assessed - Upper Body Dressing: Sitting, bed;Unsupported Lower Body Dressing: Not assessed;Other (comment) Toilet Transfer: Performed;Minimal assistance Toilet Transfer Method: Proofreader: Data processing manager - Clothing Manipulation: Not assessed Where Assessed - Toileting Clothing Manipulation: Sit to stand from 3-in-1 or toilet Toileting - Hygiene: Performed;Set up Where Assessed - Toileting Hygiene: Sit to stand from 3-in-1 or toilet Tub/Shower Transfer: Not assessed ADL Comments: Pt. agreeable for OOB activity and anxious to go home. Pt. educated on safety and techniques for completing ADLs at home. Mobility  Bed Mobility Bed Mobility: Yes Supine to Sit: 5: Supervision Supine to Sit Details (indicate cue type and reason): Pt. provided with min verbal cues for hand placement on bed rail Sitting - Scoot to Edge of Bed: 4: Min assist Transfers Transfers: Yes Sit to Stand: 4: Min assist Stand to Sit: 5: Supervision;With upper extremity assist;To bed     End of Session OT - End of Session Equipment Utilized During Treatment: Gait belt;Other (comment) Activity Tolerance: Patient tolerated treatment well Patient left: in chair;with call bell in reach;Other (comment) (With sitter present) Nurse Communication: Mobility status for transfers;Mobility status for ambulation General Behavior During Session: [facility] for tasks performed Cognition: Impaired, at baseline    Ridwan Bondy, OTR/L Pager: [phone]  09/20/2011, 1:42 PM

## 2011-09-20 NOTE — Progress Notes (Signed)
Due to pt medical decline PT will need a new order.  Please reorder PT when appropriate.  PT will sign off.

## 2011-09-20 NOTE — Progress Notes (Signed)
Electrophysiology Rounding Note  Patient Name: Paula [name]      SUBJECTIVE:alert and without complaints this am    PHYSICAL EXAM Filed Vitals:   09/20/11 0500 09/20/11 0600 09/20/11 0700 09/20/11 0800  BP: 145/112 70/44 88/52   Pulse: 115 70 70   Temp:    98.4 F (36.9 C)  TempSrc:    Oral  Resp: 22 25 17    Height:      Weight:      SpO2: 100% 100% 100%     General: Well developed, well nourished, in no acute distress. Head: Normocephalic, atraumatic,  Lungs: Clear bilaterally to auscultation without wheezes, rales, or rhonchi. Breathing is unlabored. Heart: RRR S1 S2 without murmurs, rubs, or gallops.  Abdomen: Soft, non-tender, non-distended with high pitched bowel sounds. No hepatomegaly . No obvious abdominal masses. Msk:  Strength and tone appears normal for age. Extremities: No clubbing, cyanosis or edema.  Distal pedal pulses are 2+ and equal bilaterally. Neuro: Alert and oriented  . Moves all extremities spontaneously. Psych:  Responds to questions appropriately with a normal affect.  TELEMETRY: Reviewed telemetry pt in nsr with infreq pacing  HR slowed considerably since yesterday   Intake/Output Summary (Last 24 hours) at 09/20/11 0809 Last data filed at 09/20/11 0500  Gross per 24 hour  Intake 775.74 ml  Output    550 ml  Net 225.74 ml    LABS: Basic Metabolic Panel:  Basename 09/19/11 1648 09/19/11 0612  NA -- 131*  K -- 3.2*  CL -- 94*  CO2 -- 23  GLUCOSE -- 108*  BUN -- 14  CREATININE 0.70 0.72  CALCIUM -- 9.9  MG -- --  PHOS -- --   Liver Function Tests: No results found for this basename: AST:2,ALT:2,ALKPHOS:2,BILITOT:2,PROT:2,ALBUMIN:2 in the last 72 hours No results found for this basename: LIPASE:2,AMYLASE:2 in the last 72 hours CBC:  Basename 09/19/11 1648 09/19/11 0612  WBC 13.8* 8.4  NEUTROABS -- --  HGB 11.6* 12.1  HCT 33.3* 34.5*  MCV 97.1 97.2  PLT 279 287   Cardiac Enzymes: No results found for this basename:  CKTOTAL:3,CKMB:3,CKMBINDEX:3,TROPONINI:3 in the last 72 hours BNP: No results found for this basename: POCBNP:3 in the last 72 hours D-Dimer: No results found for this basename: DDIMER:2 in the last 72 hours Hemoglobin A1C: No results found for this basename: HGBA1C in the last 72 hours Fasting Lipid Panel: No results found for this basename: CHOL,HDL,LDLCALC,TRIG,CHOLHDL,LDLDIRECT in the last 72 hours Thyroid Function Tests:  Basename 09/19/11 1648  TSH 2.694  T4TOTAL --  T3FREE --  THYROIDAB --   Anemia Panel: No results found for this basename: VITAMINB12,FOLATE,FERRITIN,TIBC,IRON,RETICCTPCT in the last 72 hours  Radiology/Studies:  Dg Chest 2 View  09/15/2011  *RADIOLOGY REPORT*  Clinical Data: Lies weakness  CHEST - 2 VIEW  Comparison: 08/04/2011  Findings: Mild cardiomegaly.  Left midlung calcified granuloma. There are ill-defined opacities at the left base worrisome for patchy airspace disease.  Lungs are otherwise clear.  Pulmonary vascularity is within normal limits.  IMPRESSION: Patchy opacities at the left base worrisome for airspace disease. Follow-up radiographs until resolution are recommended.  Original Report Authenticated By: Donavan Burnet, M.D.       ASSESSMENT AND PLAN:   Patient Active Hospital Problem List:   Complete heart block (09/19/2011)   Assessment: no further heart block    Plan: continue to follow   Have turned down pacing rate to 30 looking for evidence of recurrent bradycardia Sinus bradycardia (09/19/2011)  Assessment: as above   Plan: as above. The issue is what was the trigger for the apparent hypervagotonia that resulted in simultaneous heart block and sinus node dysfunction.  I am not sure, but would look for infectious or viscus issues.   For now it is reasonable tokeep the transvenous pacer in place but i dont think a pacer is the solution  Hypokalemia (09/19/2011)   Assessment: remeasure   Plan:        Signed, Sherryl Manges  MD  09/20/2011

## 2011-09-20 NOTE — Progress Notes (Addendum)
Physical Therapy Treatment Patient Details Name: FREIDA NEBEL MRN: [id number] DOB: 07/22/1927 Today's Date: 09/20/2011  PT Assessment/Plan  PT - Assessment/Plan PT Plan: Discharge plan remains appropriate Follow Up Recommendations: Home health PT Equipment Recommended: None recommended by PT PT Goals  Goals remain unchanged from the initial evaluation;D/C PT placed in error, but will get order reissued. Acute Rehab PT Goals PT Goal Formulation: With patient/family PT Goal: Sit to Supine/Side - Progress: Progressing toward goal Pt will Stand: Other (comment) (not addressed formally today) PT Goal: Stand - Progress:  (not addressed formally today) PT Goal: Ambulate - Progress: Progressing toward goal PT Goal: Up/Down Stairs - Progress: Progressing toward goal  PT Treatment Precautions/Restrictions  Precautions Precautions: Fall;ICD/Pacemaker Restrictions Weight Bearing Restrictions: No Mobility (including Balance) Bed Mobility Bed Mobility: Yes Supine to Sit: 5: Supervision Supine to Sit Details (indicate cue type and reason): vc's for hand placeent Sitting - Scoot to Edge of Bed: 4: Min assist Transfers Transfers: Yes Sit to Stand: 4: Min assist Sit to Stand Details (indicate cue type and reason): vc's  for hand placement Stand to Sit:  (min guard A) Stand to Sit Details: vc's for hand placement (back on bed) Ambulation/Gait Ambulation/Gait: Yes Ambulation/Gait Assistance: 4: Min assist Ambulation/Gait Assistance Details (indicate cue type and reason): Mildly unsteady through out, but pt need very little A Ambulation Distance (Feet): 150 Feet Assistive device: 1 person hand held assist (used IV pole to help steady herself) Gait Pattern: Within Functional Limits  Balance Balance Assessed: Yes Exercise    End of Session PT - End of Session Activity Tolerance: Patient tolerated treatment well;Patient limited by fatigue Patient left: in chair Nurse Communication:  Mobility status for ambulation General Behavior During Session: [facility] for tasks performed Cognition: Impaired, at baseline  Arless Vineyard, Eliseo Gum 09/20/2011, 2:21 PM  09/20/2011  Shepherd Bing, PT [phone] (pager)

## 2011-09-20 NOTE — Consult Note (Signed)
Electrophysiology Consult Note    Patient ID: JEFF MCCALLUM MRN: [id number], DOB/AGE: 05-06-1927 77 y.o.  Admit date: 09/15/2011 Date of Consult: 09/20/2011  Primary Physician: Georgianne Fick, MD, MD Primary Cardiologist: Dr. Myrtis Ser  Chief Complaint: Complete heart block Reason for Consultation: Syncope  HPI: Ms. Acoff is an 77 year old woman admitted about a week ago because of a 3 or 4 week history of relatively nonspecific complaints.   There has been  Anorexia, weakness, and some confusion. She's had some lightheadedness, although this has been relatively slow in onset. Her husband recalls her having gotten dizzy and putting her head on the table. She took they rag from his hand and wiped it subsequently. They deny syncope or seizures.  She has no known cardiac disease. She does however have a AAA is greater than 5 cm and has been followed by Dr. Hart [place].  Her ability to give a more extensive past history is markedly limited.  This morning just prior to discharge she lost consciousness. On telemetry she developed complete heart block and sinus node dysfunction. While lying in bed some time later she had a seizure iagain associated with complete heart block.  She underwent temporary transvenous pacing this afternoon.   Apparently also she had a fall last night unassociated with rhythm disturbances Past Medical History  Diagnosis Date  . Diabetes mellitus   . Thyroid disease   . Abdominal aneurysm   . COPD (chronic obstructive pulmonary disease)   . Hypertension   . Shortness of breath   . Hypothyroidism   . Depression   . Recurrent upper respiratory infection (URI)       Surgical History:  Past Surgical History  Procedure Date  . Cholecystectomy   . Joint replacement   . Sinus surgery with instatrak   . Appendectomy   . Oophorectomy   . Arthroscopic repair acl      Prescriptions prior to admission  Medication Sig Dispense Refill  . ALPRAZolam (XANAX) 0.25 MG tablet  Take 0.25 mg by mouth at bedtime as needed. TAKES 1 TAB IN THE MORNING IN 2 TABS IN THE EVENING      . amitriptyline (ELAVIL) 10 MG tablet Take 10 mg by mouth at bedtime.        Marland Kitchen buPROPion (WELLBUTRIN) 100 MG tablet Take 100 mg by mouth daily.       . calcium carbonate (OS-CAL) 600 MG TABS Take 600 mg by mouth daily.        . cholecalciferol (VITAMIN D) 1000 UNITS tablet Take 1,000 Units by mouth daily.        . hydrochlorothiazide (HYDRODIURIL) 25 MG tablet Take 25 mg by mouth daily.        Marland Kitchen levothyroxine (SYNTHROID, LEVOTHROID) 100 MCG tablet Take 100 mcg by mouth daily.        . Lutein 10 MG TABS Take 1 tablet by mouth daily.        Marland Kitchen DISCONTD: perphenazine (TRILAFON) 4 MG tablet Take 2 mg by mouth every other day.         Inpatient Medications:     . ALPRAZolam  0.25 mg Oral QHS  . atropine      . buPROPion  100 mg Oral Daily  . calcium carbonate  1 tablet Oral Daily  . cholecalciferol  1,000 Units Oral Daily  . docusate sodium  100 mg Oral BID  . doxycycline  100 mg Oral Q12H  . EPINEPHrine      . EPINEPHrine      .  guaiFENesin  600 mg Oral BID  . haloperidol lactate      . haloperidol lactate  3 mg Intravenous Once  . haloperidol lactate  3 mg Intravenous Once  . heparin  5,000 Units Subcutaneous Q8H  . hydrochlorothiazide  25 mg Oral Daily  . levothyroxine  100 mcg Oral Daily  . midazolam  2 mg Intravenous Once  . phentolamine  5 mg Subcutaneous To Cath  . sodium chloride  3 mL Intravenous Q12H  . DISCONTD: enoxaparin  40 mg Subcutaneous Q24H  . DISCONTD: levofloxacin  500 mg Oral Daily  . DISCONTD: phentolamine  5 mg Subcutaneous Once    Allergies:  Allergies  Allergen Reactions  . Sulfa Antibiotics Anaphylaxis  . Contrast Media (Iodinated Diagnostic Agents) Swelling  . Iohexol      Desc: PT STATES SHE HAD A SEVERE REACTION TO IV CONRAST WITH THROAT SWELLING AND SOB. SHE WAS ADMITTED TO THE HOSPITAL. SHE HAS NEVER HAD CONTRAST AGAIN.   Marland Kitchen Penicillins Rash     History   Social History  . Marital Status: Married    Spouse Name: N/A    Number of Children: N/A  . Years of Education: N/A   Occupational History  . Not on file.   Social History Main Topics  . Smoking status: Former Smoker    Quit date: 08/31/2011  . Smokeless tobacco: Not on file  . Alcohol Use: No  . Drug Use: No  . Sexually Active:    Other Topics Concern  . Not on file   Social History Narrative  . No narrative on file     Family History  Problem Relation Age of Onset  . Cancer Father      Review of Systems: General: negative for chills, fever, night sweats or weight changes.  Cardiovascular: negative for chest pain, dyspnea on exertion, edema, orthopnea, palpitations, paroxysmal nocturnal dyspnea or shortness of breath Dermatological: negative for rash Respiratory: negative for cough or wheezing Urologic: negative for hematuria Abdominal: negative for nausea, vomiting, diarrhea, bright red blood per rectum, melena, or hematemesis Neurologic: negative for visual changes, syncope, or dizziness All other systems reviewed and are otherwise negative except as noted above.    Physical Exam:  Blood pressure 88/52, pulse 70, temperature 98.4 F (36.9 C), temperature source Oral, resp. rate 17, height 5\' 6"  (1.676 m), weight 73.846 kg (162 lb 12.8 oz), SpO2 100.00%.  General appearance: alert, appears older than stated age and no distress Head: Normocephalic, without obvious abnormality, atraumatic Eyes: conjunctivae/corneas clear. PERRL, EOM's intact. Fundi benign. Ears: normal TM's and external ear canals both ears Nose: Nares normal. Septum midline. Mucosa normal. No drainage or sinus tenderness. Throat: lips, mucosa, and tongue normal; teeth and gums normal Neck: no adenopathy, no carotid bruit, no JVD, supple, symmetrical, trachea midline and thyroid not enlarged, symmetric, no tenderness/mass/nodules Back: symmetric, no curvature. ROM normal. No CVA  tenderness., Not examined as she was laid on her back Resp: clear to auscultation bilaterally and listened to laterally Chest wall: no tenderness Cardio: regular rate and rhythm, S1, S2 normal, no murmur, click, rub or gallop GI: soft, non-tender; bowel sounds normal; no masses,  no organomegaly Extremities: extremities normal, atraumatic, no cyanosis or edema Pulses: 2+ and symmetric Trace pulses bilaterally Skin: Skin color, texture, turgor normal. No rashes or lesions Lymph nodes: Cervical, supraclavicular, and axillary nodes normal. normal affect  neurological exam demonstrates normal gross motor function; she is alert and oriented x2. She is modestly confused.  Labs:  Lab Results  Component Value Date   WBC 13.8* 09/19/2011   HGB 11.6* 09/19/2011   HCT 33.3* 09/19/2011   MCV 97.1 09/19/2011   PLT 279 09/19/2011     Lab 09/19/11 1648 09/19/11 0612 09/15/11 1800  NA -- 131* --  K -- 3.2* --  CL -- 94* --  CO2 -- 23 --  BUN -- 14 --  CREATININE 0.70 -- --  CALCIUM -- 9.9 --  PROT -- -- 6.6  BILITOT -- -- 0.3  ALKPHOS -- -- 63  ALT -- -- 16  AST -- -- 16  GLUCOSE -- 108* --   Lab Results  Component Value Date   TROPONINI <0.30 09/15/2011   No results found for this basename: CHOL   No results found for this basename: HDL   No results found for this basename: LDLCALC   No results found for this basename: TRIG   No results found for this basename: CHOLHDL   No results found for this basename: DDIMER     Radiology/Studies: Dg Chest 2 View  09/15/2011  *RADIOLOGY REPORT*  Clinical Data: Lies weakness  CHEST - 2 VIEW  Comparison: 08/04/2011  Findings: Mild cardiomegaly.  Left midlung calcified granuloma. There are ill-defined opacities at the left base worrisome for patchy airspace disease.  Lungs are otherwise clear.  Pulmonary vascularity is within normal limits.  IMPRESSION: Patchy opacities at the left base worrisome for airspace disease. Follow-up radiographs until  resolution are recommended.  Original Report Authenticated By: Donavan Burnet, M.D.   Ct Head Wo Contrast  09/15/2011  *RADIOLOGY REPORT*  Clinical Data: Numbness.  Lightheadedness.  CT HEAD WITHOUT CONTRAST  Technique:  Contiguous axial images were obtained from the base of the skull through the vertex without contrast.  Comparison: None.  Findings: Global atrophy.  Chronic ischemic changes in the periventricular white matter.  No mass effect, midline shift, or acute intracranial hemorrhage.  Mastoid air cells are clear. Visualized paranasal sinuses are clear.  Cranium is intact.  IMPRESSION: No acute intracranial pathology.  Chronic changes.  Original Report Authenticated By: Donavan Burnet, M.D.   Mr Brain Wo Contrast  09/18/2011  *RADIOLOGY REPORT*  Clinical Data: 77 year old female with confusion, dysphasia, unsteady gait.  The patient reports an allergy to contrast (listed in the medical record is iodinated contrast) and declined gadolinium contrast for this exam.  MRI HEAD WITHOUT CONTRAST  Technique:  Multiplanar, multiecho pulse sequences of the brain and surrounding structures were obtained according to standard protocol without intravenous contrast.  Comparison: Head CTs 09/15/2011 and earlier. Neck CT 05/13/2011.  Findings: No restricted diffusion to suggest acute infarction.  No midline shift, mass effect, evidence of mass lesion, ventriculomegaly, extra-axial collection or acute intracranial hemorrhage.  Cervicomedullary junction and pituitary are within normal limits.  Major intracranial vascular flow voids are preserved with a degree of intracranial artery dolichoectasia.  Scattered chronic micro hemorrhages, mostly in the frontal lobe subcortical white matter (series 7 image 12, 13).  Confluent periventricular and central cerebral white matter T2 and FLAIR hyperintensity.  Comparatively mild T2 hyperintensity in the pons. Deep gray matter nuclei and cerebellum within normal limits for age.   Negative visualized cervical spine. Visualized bone marrow signal is within normal limits.  Grossly normal visualized internal auditory structures.  Postoperative changes to the globes. Visualized paranasal sinuses and mastoids are clear.  A dark T1 and T2 signal round mass in the superficial lobe of the right parotid gland is partially visible measuring 15  x 19 x 13 mm. This is stable since 05/13/2011.  IMPRESSION: 1. No acute intracranial abnormality.  Chronic small vessel disease. 2.  Right parotid gland mass is stable since 05/13/2011, but decreased T2 signal is noted and tumors with low signal intensity on T2-weighted images are more likely to be malignant even when they are well-demarcated.  Recommend ENT follow-up.  Original Report Authenticated By: Ulla Potash III, M.D.    EKG: Electrocardiogram demonstrates sinus rhythm at about 110 with intervals of 0.22/0.09/0.43.  Telemetry strip demonstrates complete heart block and sinus node dysfunction occurring simultaneously.   Patient Active Hospital Problem List: Complete heart block (09/19/2011)   Assessment: The patient developed complete heart block and sinus node dysfunction simultaneously. These were associated with the abrupt onset of syncope and seizures. The patient has never had this before her consistent with the observation that the simultaneous occurrence is likely secondary to a vagatonic trigger as opposed to a primary cardiac event. As such, I would not recommend pacing but rather pursuing a likely GI/GU for her which resulted in the hyper vagal tone. It is reasonable to maintain a temporary transvenous pacemaker for now. I have discussed this with the primary care team.  Other considerations would include ischemia which is unlikely based on the electrocardiogram but the presence of which needs to be considered given her peripheral vascular disease. I also don't know whether this might be an atypical presentation related to her triple A;  perhaps vascular input would be of help.    Plan: As above  Sinus bradycardia (09/19/2011)   Assessment: As above    Plan: As above  Hypokalemia (09/19/2011)   Assessment: Needs replacement    Plan:            Signed, Sherryl Manges MD

## 2011-09-20 NOTE — Progress Notes (Signed)
UR Completed.  Henslee Lottman Jane 09/20/2011  

## 2011-09-20 NOTE — Progress Notes (Signed)
Name: Paula [name] MRN: [id number] DOB: Sep 24, 1927  DOS:   09/20/11   CRITICAL CARE MEDICINE progress note  Patient Description  77 y/o female with DM, COPD was admitted on 11/4 for weakness, unsteadiness, shortness of breath. Treated with levaquin for a UTI and pneumonia.  Fell night of 11/7 and hit head, family did not tell nursing.  On AM of 11/8, stood up, felt dizzy --> fell --> sick sinus syndrome with long pauses --> atropine and transcutaenously paced then temp pacer placed.  We are consulted for post procedure agitation and tachycardia11/8/12 and PCCM assumed care as primary    Location Start Stop  Sheath R IJ 11/8    Culture Date Result  urine 11/4 No growth   Antibiotic Indication Start Stop  levaquin Doxycycline PNA PNA 11/4 11/8 11/8   GI Prophylaxis DVT Prophylaxis  Not indicated Sq hep     Protocols     Consultants  cardiology     Date Imaging Studies  11/4 11/8 CXR Left lung base CT head >> negative   Date  Overnight Events  11/9 Patient now on precedex and has related hypotension. Continues with confusion but calmer due to precedex. Dr Graciela Husbands feels cardiac issues are extrinsic and unrelated to primary ep or cardiac issues      Past Medical History  Diagnosis Date  . Diabetes mellitus   . Thyroid disease   . Abdominal aneurysm   . COPD (chronic obstructive pulmonary disease)   . Hypertension   . Shortness of breath   . Hypothyroidism   . Depression   . Recurrent upper respiratory infection (URI)      Physical Examination  BP 88/52  Pulse 70  Temp(Src) 98.7 F (37.1 C) (Oral)  Resp 17  Ht 5\' 6"  (1.676 m)  Wt 162 lb 12.8 oz (73.846 kg)  BMI 26.28 kg/m2  SpO2 100%  Gen: Chronically unwell looking in bed in ICU. Not on ventilator.  HEENT: NCAT, no bruising, PERRL, EOMi, OP clear, PULM: crackles at bases, no wheezes. CV: tachy, regular, no mgr, no JVD AB: BS+, soft, nontender, no hsm Ext: warm, no edema, no clubbing, no  cyanosis Derm: no rash or skin breakdown Neuro: Awake, confused,, not oriented to location or situation or year, CN II-XII intact, strength 5/5 in all 4 extremeties but CAM-ICU was negative for delirium Labs  Results for orders placed during the hospital encounter of 09/15/11 (from the past 24 hour(s))  GLUCOSE, CAPILLARY     Status: Abnormal   Collection Time   09/19/11 11:28 AM      Component Value Range   Glucose-Capillary 116 (*) 70 - 99 (mg/dL)   Comment 1 Notify RN     Comment 2 Documented in Chart    GLUCOSE, CAPILLARY     Status: Abnormal   Collection Time   09/19/11  2:47 PM      Component Value Range   Glucose-Capillary 131 (*) 70 - 99 (mg/dL)  GLUCOSE, CAPILLARY     Status: Abnormal   Collection Time   09/19/11  4:25 PM      Component Value Range   Glucose-Capillary 122 (*) 70 - 99 (mg/dL)  MRSA PCR SCREENING     Status: Normal   Collection Time   09/19/11  4:36 PM      Component Value Range   MRSA by PCR NEGATIVE  NEGATIVE   T4, FREE     Status: Normal   Collection Time   09/19/11  4:48  PM      Component Value Range   Free T4 1.76  0.80 - 1.80 (ng/dL)  TSH     Status: Normal   Collection Time   09/19/11  4:48 PM      Component Value Range   TSH 2.694  0.350 - 4.500 (uIU/mL)  CBC     Status: Abnormal   Collection Time   09/19/11  4:48 PM      Component Value Range   WBC 13.8 (*) 4.0 - 10.5 (K/uL)   RBC 3.43 (*) 3.87 - 5.11 (MIL/uL)   Hemoglobin 11.6 (*) 12.0 - 15.0 (g/dL)   HCT 16.1 (*) 09.6 - 46.0 (%)   MCV 97.1  78.0 - 100.0 (fL)   MCH 33.8  26.0 - 34.0 (pg)   MCHC 34.8  30.0 - 36.0 (g/dL)   RDW 04.5  40.9 - 81.1 (%)   Platelets 279  150 - 400 (K/uL)  CREATININE, SERUM     Status: Abnormal   Collection Time   09/19/11  4:48 PM      Component Value Range   Creatinine, Ser 0.70  0.50 - 1.10 (mg/dL)   GFR calc non Af Amer 79 (*) >90 (mL/min)   GFR calc Af Amer >90  >90 (mL/min)  GLUCOSE, CAPILLARY     Status: Abnormal   Collection Time   09/19/11  7:40 PM       Component Value Range   Glucose-Capillary 124 (*) 70 - 99 (mg/dL)   Comment 1 Documented in Chart     Comment 2 Notify RN      Imaging  Ct Head Wo Contrast  09/19/2011  *RADIOLOGY REPORT*  Clinical Data: Recent fall, altered mental status.  CT HEAD WITHOUT CONTRAST  Technique:  Contiguous axial images were obtained from the base of the skull through the vertex without contrast.  Comparison: 09/18/2011 MRI  Findings: Periventricular and subcortical white matter hypodensities are most in keeping with chronic microangiopathic change. There is no evidence for acute hemorrhage, hydrocephalus, mass lesion, or abnormal extra-axial fluid collection.  No definite CT evidence for acute infarction.  The visualized paranasal sinuses and mastoid air cells are predominately clear.  No displaced calvarial fracture.  Bilateral lens replacement.  IMPRESSION: No acute intracranial abnormality.  Unchanged white matter hypodensities.  Original Report Authenticated By: Waneta Martins, M.D.   Mr Brain Wo Contrast  09/18/2011  *RADIOLOGY REPORT*  Clinical Data: 77 year old female with confusion, dysphasia, unsteady gait.  The patient reports an allergy to contrast (listed in the medical record is iodinated contrast) and declined gadolinium contrast for this exam.  MRI HEAD WITHOUT CONTRAST  Technique:  Multiplanar, multiecho pulse sequences of the brain and surrounding structures were obtained according to standard protocol without intravenous contrast.  Comparison: Head CTs 09/15/2011 and earlier. Neck CT 05/13/2011.  Findings: No restricted diffusion to suggest acute infarction.  No midline shift, mass effect, evidence of mass lesion, ventriculomegaly, extra-axial collection or acute intracranial hemorrhage.  Cervicomedullary junction and pituitary are within normal limits.  Major intracranial vascular flow voids are preserved with a degree of intracranial artery dolichoectasia.  Scattered chronic micro  hemorrhages, mostly in the frontal lobe subcortical white matter (series 7 image 12, 13).  Confluent periventricular and central cerebral white matter T2 and FLAIR hyperintensity.  Comparatively mild T2 hyperintensity in the pons. Deep gray matter nuclei and cerebellum within normal limits for age.  Negative visualized cervical spine. Visualized bone marrow signal is within normal limits.  Grossly normal  visualized internal auditory structures.  Postoperative changes to the globes. Visualized paranasal sinuses and mastoids are clear.  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 This is stable since 05/13/2011.  IMPRESSION: 1. No acute intracranial abnormality.  Chronic small vessel disease. 2.  Right parotid gland mass is stable since 05/13/2011, but decreased T2 signal is noted and tumors with low signal intensity on T2-weighted images are more likely to be malignant even when they are well-demarcated.  Recommend ENT follow-up.  Original Report Authenticated By: Harley Hallmark, M.D.   Dg Chest Portable 1 View  09/20/2011  *RADIOLOGY REPORT*  Clinical Data: Pneumonia  PORTABLE CHEST - 1 VIEW  Comparison: 09/15/2011  Findings: A right internal jugular vein temporary pacemaker has been placed.  Tip projects over the right ventricle.  Heart is moderately enlarged.  Vascular congestion has developed.  Bibasilar haziness worrisome for developing airspace disease or edema is worse.  No pneumothorax.  IMPRESSION: Right internal jugular vein temporary pacemaker tip projects over the right ventricle and no pneumothorax  Increasing vascular congestion and bibasilar airspace disease worrisome for edema.  Original Report Authenticated By: Donavan Burnet, M.D.    Assessment  Neuro:  Delirium - Ddx due to infections, home depression meds,  levaquin, sundowning, or fall 11/7 (CT head neg for sub dural).  Likely has baseline mild  dementia - husband reporting some loss of executive function (balancing check and forgets medicines) x 6 months.  Plan:  - Check BMP/CBC/ABG/Mg/Phos/PCT/Lactate/BNP- to r/o electrolyte abn, infectious process or cardiac abn as cause of her delirium and agitation. -continue  precedex drip-  started at 23:01 11/8 (MONITOR WITH CARE DUE TO RHYTHM ISSUES) -frequent orientation -avoid benzos/sedating meds -stop levaquin (quinolone related) - on doxy - judicious use of haldol only due to rhtyym issues  Resp: Pneumonia, organism unspecified (09/15/2011) and COPD   Assessment: was improving clinically until yesterday. Does not appear to have worsening respiratory status, but WBC up. COPD stable    Plan:  -continue antibiotics for pneumonia, changed to Doxycycline -stop levaquin -repeat CXR- pulm edema at bases. -culture if febrile - prn nebs for COPD.  CVS: Complete heart block and Sinus bradycardia (09/19/2011).  Assessment: Sick sinus syndrome with long pauses, now s/p pacer. Hypotensive - but on precedex drip. On 11/9 Dr Graciela Husbands feels primary issue is not EP but secondary due to extrinsic issuses NOS. However,husband reporting dizzy episodes for weeks at  Home on and off    Plan:  -f/u with cardiology for further recs - evaluate for medical cause and lytes - continue transvernous pacer at low back up for now per EP  - no plans for permanent pacer per cards  ID:  PNA as Resp.  Leuckocytosis but afebrile. Plan: As Resp - labs as neuro   PATEL,RAVI, MD 09/20/2011, 7:54 AM   STAFF NOTE:  I Dr Lavinia Sharps personally evaluated patient. I elicited the key finding of the fact patient been having dizziness even before admission. Was being treated empiric for UTI but then had sinus pauses and needed transvenous pacer. In ICU has delirium which is controlled on precedex but cause not known. We will get blood work to evaluate for etiology for delirum. Updated husband.   > 35 min spent

## 2011-09-20 NOTE — Progress Notes (Signed)
  Echocardiogram 2D Echocardiogram has been performed.  [name], Paula Hendon 09/20/2011, 10:52 AM

## 2011-09-20 NOTE — Progress Notes (Signed)
Pt doing well. Compliant tonight. Sitter and spouse at bedside. Will continue to monitor

## 2011-09-21 LAB — BASIC METABOLIC PANEL
BUN: 11 mg/dL (ref 6–23)
CO2: 27 mEq/L (ref 19–32)
CO2: 26 mEq/L (ref 19–32)
Calcium: 8.7 mg/dL (ref 8.4–10.5)
Chloride: 99 mEq/L (ref 96–112)
Creatinine, Ser: 0.68 mg/dL (ref 0.50–1.10)
Creatinine, Ser: 0.78 mg/dL (ref 0.50–1.10)
GFR calc Af Amer: 88 mL/min — ABNORMAL LOW (ref >90)
Glucose, Bld: 145 mg/dL — ABNORMAL HIGH (ref 70–99)
Potassium: 3.6 mEq/L (ref 3.5–5.1)

## 2011-09-21 LAB — CBC
MCH: 34.4 pg — ABNORMAL HIGH (ref 26.0–34.0)
MCV: 97.5 fL (ref 78.0–100.0)
Platelets: 241 10*3/uL (ref 150–400)
RDW: 13 % (ref 11.5–15.5)

## 2011-09-21 MED ORDER — POTASSIUM CHLORIDE 10 MEQ/50ML IV SOLN
INTRAVENOUS | Status: AC
  Administered 2011-09-21: 10 meq
  Filled 2011-09-21: qty 50

## 2011-09-21 MED ORDER — POTASSIUM CHLORIDE 10 MEQ/100ML IV SOLN
10.0000 meq | INTRAVENOUS | Status: AC
  Administered 2011-09-21 (×6): 10 meq via INTRAVENOUS

## 2011-09-21 MED ORDER — POTASSIUM CHLORIDE 20 MEQ PO PACK: 40.0000 meq | PACK | ORAL | Status: DC

## 2011-09-21 MED ORDER — BUDESONIDE 0.25 MG/2ML IN SUSP
0.2500 mg | Freq: Two times a day (BID) | RESPIRATORY_TRACT | Status: DC
  Administered 2011-09-21 – 2011-09-24 (×7): 0.25 mg via RESPIRATORY_TRACT
  Filled 2011-09-21 (×9): qty 2

## 2011-09-21 MED ORDER — IPRATROPIUM BROMIDE 0.02 % IN SOLN
0.5000 mg | Freq: Four times a day (QID) | RESPIRATORY_TRACT | Status: DC
  Administered 2011-09-21 – 2011-09-24 (×13): 0.5 mg via RESPIRATORY_TRACT
  Filled 2011-09-21 (×13): qty 2.5

## 2011-09-21 MED ORDER — POTASSIUM CHLORIDE 10 MEQ/100ML IV SOLN
10.0000 meq | Freq: Once | INTRAVENOUS | Status: AC
  Administered 2011-09-21: 10 meq via INTRAVENOUS

## 2011-09-21 MED ORDER — DOXYCYCLINE HYCLATE 100 MG PO TABS
100.0000 mg | ORAL_TABLET | Freq: Two times a day (BID) | ORAL | Status: DC
  Administered 2011-09-21 – 2011-09-23 (×5): 100 mg via ORAL
  Filled 2011-09-21 (×6): qty 1

## 2011-09-21 MED ORDER — POTASSIUM CHLORIDE CRYS ER 20 MEQ PO TBCR
20.0000 meq | EXTENDED_RELEASE_TABLET | ORAL | Status: AC
  Administered 2011-09-21 (×2): 20 meq via ORAL
  Filled 2011-09-21 (×2): qty 1

## 2011-09-21 NOTE — Progress Notes (Signed)
Name: Paula [name] MRN: [id number] DOB: Mar 27, 1927  DOS:   09/20/11   CRITICAL CARE MEDICINE progress note  Patient Description  77 y/o female with DM, COPD was admitted on 11/4 for weakness, unsteadiness, shortness of breath. Treated with levaquin for a UTI and pneumonia.  Fell night of 11/7 and hit head, family did not tell nursing.  On AM of 11/8, stood up, felt dizzy --> fell --> sick sinus syndrome with long pauses --> atropine and transcutaenously paced then temp pacer placed.  We are consulted for post procedure agitation and tachycardia11/8/12 and PCCM assumed care as primary    Location Start Stop  Sheath R IJ 11/8    Culture Date Result  urine 11/4 No growth   Antibiotic Indication Start Stop  levaquin Doxycycline PNA PNA 11/4 11/8 11/8   GI Prophylaxis DVT Prophylaxis  Not indicated Sq hep     Protocols     Consultants  Cardiology Dr Graciela Husbands     Date Imaging Studies  11/4 11/8 CXR Left lung base CT head >> negative   Date  Overnight Events  11/10 Off precedex. Doing well. Calm. Walked. On bed pan  Now. Not agitated. Oriented. Vitals normal. Potassium low and being repleted      Past Medical History  Diagnosis Date  . Diabetes mellitus   . Thyroid disease   . Abdominal aneurysm   . COPD (chronic obstructive pulmonary disease)   . Hypertension   . Shortness of breath   . Hypothyroidism   . Depression   . Recurrent upper respiratory infection (URI)      Physical Examination  BP 146/68  Pulse 93  Temp(Src) 99.1 F (37.3 C) (Oral)  Resp 25  Ht 5\' 6"  (1.676 m)  Wt 73.846 kg (162 lb 12.8 oz)  BMI 26.28 kg/m2  SpO2 100%  Gen: Chronically unwell looking in bed in ICU. Not on ventilator.  Looking much better 11/10 HEENT: NCAT, no bruising, PERRL, EOMi, OP clear, PULM: crackles at bases, no wheezes. CV: tachy, regular, no mgr, no JVD AB: BS+, soft, nontender, no hsm Ext: warm, no edema, no clubbing, no cyanosis Derm: no rash or skin  breakdown Neuro: CN II-XII intact, strength 5/5 in all 4 extremeties but CAM-ICU was negative for delirium. AxOx3. Talking  Psych: Pleasant Labs  Results for orders placed during the hospital encounter of 09/15/11 (from the past 24 hour(s))  BASIC METABOLIC PANEL     Status: Abnormal   Collection Time   09/20/11  9:05 AM      Component Value Range   Sodium 135  135 - 145 (mEq/L)   Potassium 3.3 (*) 3.5 - 5.1 (mEq/L)   Chloride 100  96 - 112 (mEq/L)   CO2 26  19 - 32 (mEq/L)   Glucose, Bld 113 (*) 70 - 99 (mg/dL)   BUN 15  6 - 23 (mg/dL)   Creatinine, Ser 4.09  0.50 - 1.10 (mg/dL)   Calcium 9.0  8.4 - 81.1 (mg/dL)   GFR calc non Af Amer 79 (*) >90 (mL/min)   GFR calc Af Amer >90  >90 (mL/min)  MAGNESIUM     Status: Normal   Collection Time   09/20/11  9:05 AM      Component Value Range   Magnesium 2.3  1.5 - 2.5 (mg/dL)  CBC     Status: Abnormal   Collection Time   09/20/11  9:05 AM      Component Value Range   WBC 7.6  4.0 - 10.5 (K/uL)   RBC 3.21 (*) 3.87 - 5.11 (MIL/uL)   Hemoglobin 10.8 (*) 12.0 - 15.0 (g/dL)   HCT 16.1 (*) 09.6 - 46.0 (%)   MCV 97.5  78.0 - 100.0 (fL)   MCH 33.6  26.0 - 34.0 (pg)   MCHC 34.5  30.0 - 36.0 (g/dL)   RDW 04.5  40.9 - 81.1 (%)   Platelets 251  150 - 400 (K/uL)  DIFFERENTIAL     Status: Abnormal   Collection Time   09/20/11  9:05 AM      Component Value Range   Neutrophils Relative 61  43 - 77 (%)   Neutro Abs 4.6  1.7 - 7.7 (K/uL)   Lymphocytes Relative 22  12 - 46 (%)   Lymphs Abs 1.6  0.7 - 4.0 (K/uL)   Monocytes Relative 16 (*) 3 - 12 (%)   Monocytes Absolute 1.2 (*) 0.1 - 1.0 (K/uL)   Eosinophils Relative 1  0 - 5 (%)   Eosinophils Absolute 0.0  0.0 - 0.7 (K/uL)   Basophils Relative 0  0 - 1 (%)   Basophils Absolute 0.0  0.0 - 0.1 (K/uL)  LACTIC ACID, PLASMA     Status: Normal   Collection Time   09/20/11  9:05 AM      Component Value Range   Lactic Acid, Venous 1.0  0.5 - 2.2 (mmol/L)  PHOSPHORUS     Status: Normal    Collection Time   09/20/11  9:05 AM      Component Value Range   Phosphorus 2.4  2.3 - 4.6 (mg/dL)  PROCALCITONIN     Status: Normal   Collection Time   09/20/11  9:30 AM      Component Value Range   Procalcitonin <0.10    CBC     Status: Abnormal   Collection Time   09/21/11  5:00 AM      Component Value Range   WBC 8.8  4.0 - 10.5 (K/uL)   RBC 3.23 (*) 3.87 - 5.11 (MIL/uL)   Hemoglobin 11.1 (*) 12.0 - 15.0 (g/dL)   HCT 91.4 (*) 78.2 - 46.0 (%)   MCV 97.5  78.0 - 100.0 (fL)   MCH 34.4 (*) 26.0 - 34.0 (pg)   MCHC 35.2  30.0 - 36.0 (g/dL)   RDW 95.6  21.3 - 08.6 (%)   Platelets 241  150 - 400 (K/uL)  BASIC METABOLIC PANEL     Status: Abnormal   Collection Time   09/21/11  5:00 AM      Component Value Range   Sodium 136  135 - 145 (mEq/L)   Potassium 2.4 (*) 3.5 - 5.1 (mEq/L)   Chloride 100  96 - 112 (mEq/L)   CO2 27  19 - 32 (mEq/L)   Glucose, Bld 119 (*) 70 - 99 (mg/dL)   BUN 13  6 - 23 (mg/dL)   Creatinine, Ser 5.78  0.50 - 1.10 (mg/dL)   Calcium 8.7  8.4 - 46.9 (mg/dL)   GFR calc non Af Amer 79 (*) >90 (mL/min)   GFR calc Af Amer >90  >90 (mL/min)    Imaging  Ct Head Wo Contrast  09/19/2011  *RADIOLOGY REPORT*  Clinical Data: Recent fall, altered mental status.  CT HEAD WITHOUT CONTRAST  Technique:  Contiguous axial images were obtained from the base of the skull through the vertex without contrast.  Comparison: 09/18/2011 MRI  Findings: Periventricular and subcortical white matter hypodensities are most in  keeping with chronic microangiopathic change. There is no evidence for acute hemorrhage, hydrocephalus, mass lesion, or abnormal extra-axial fluid collection.  No definite CT evidence for acute infarction.  The visualized paranasal sinuses and mastoid air cells are predominately clear.  No displaced calvarial fracture.  Bilateral lens replacement.  IMPRESSION: No acute intracranial abnormality.  Unchanged white matter hypodensities.  Original Report Authenticated By:  Waneta Martins, M.D.   Dg Chest Portable 1 View  09/20/2011  *RADIOLOGY REPORT*  Clinical Data: Pneumonia  PORTABLE CHEST - 1 VIEW  Comparison: 09/15/2011  Findings: A right internal jugular vein temporary pacemaker has been placed.  Tip projects over the right ventricle.  Heart is moderately enlarged.  Vascular congestion has developed.  Bibasilar haziness worrisome for developing airspace disease or edema is worse.  No pneumothorax.  IMPRESSION: Right internal jugular vein temporary pacemaker tip projects over the right ventricle and no pneumothorax  Increasing vascular congestion and bibasilar airspace disease worrisome for edema.  Original Report Authenticated By: Donavan Burnet, M.D.    Assessment  Neuro:  Delirium - Ddx due to infections, home depression meds,  levaquin, sundowning, or fall 11/7 (CT head neg for sub dural).  Likely has baseline mild dementia - husband reporting some loss of executive function (balancing check and forgets medicines) x 6 months.    - On 11/10: Sepsis  Markers negative. Much improved after precedex gtt. Now off precedex. Unclear why she was agitated but seems imprved after levaquin was stopped 11/8 pm  Plan:  - DC precedex  -frequent orientation -avoid benzos/sedating meds - avoid haldol as much as possible - always check QTc  -finish doxy Rx - consider placing levaquin as causative agent for delirium depending on rest of hospital course - consider not restarting home TCAD which due to arrythmia - dc HCTZ  Resp: Pneumonia, organism unspecified (09/15/2011) and COPD   Assessment: was improving clinically until yesterday. Does not appear to have worsening respiratory status, but WBC up. COPD stable    Plan:  -continue antibiotics for pneumonia, changed to Doxycycline (instead of levaquin); 8 day total antibioptics - nebs for COPD.; scheduled atrovent  Qid + pulmicort bid + albuterol prn  CVS: Complete heart block and Sinus bradycardia (09/19/2011).   A On 11/9 Dr Graciela Husbands feels primary issue is not EP but secondary due to extrinsic issuses NOS and Low K a likely etiology. However,husband reporting dizzy episodes for weeks at  Home on and off     Plan:  -f/u with cardiology for further recs -aggressive correction of lytes and DC HCTZ - continue transvernous pacer at low back up for now per EP  - no plans for permanent pacer per cards  ID:  PNA as Resp.  Leuckocytosis but afebrile. Plan: As Resp  ELECTROLYTES  K 2.4 on 11/10 but mag and phos nromal on 11/9. On 11/10 hyusband admitting to low k for patient at home nos  PLAN Aggressively replete low k DC HCTZ (home med) Recheck and also monitor mag and phos  DC PLAN - transfer to tele on [facility] service team 2 - d/w Dr Butler [place] of [facility] Kalman Shan, MD 09/21/2011, 8:34 AM

## 2011-09-21 NOTE — Progress Notes (Signed)
SUBJECTIVE: The patient is doing well today.  No further pauses or brady events.  She remains confused.  At this time, she denies shortness of breath, or any new concerns.  She has had several short sharp fleeting chest pains, none presently.  OBJECTIVE: Physical Exam: Filed Vitals:   09/21/11 0500 09/21/11 0600 09/21/11 0700 09/21/11 0800  BP: 142/73 149/74 146/68   Pulse: 87 97 93   Temp:    99.1 F (37.3 C)  TempSrc:    Oral  Resp: 21 24 25    Height:      Weight:      SpO2: 99% 100% 100%     Intake/Output Summary (Last 24 hours) at 09/21/11 1610 Last data filed at 09/21/11 0600  Gross per 24 hour  Intake    960 ml  Output    928 ml  Net     32 ml    Telemetry reveals sinus rhythm, no further pauses, no ventricular pacing  GEN- The patient is elderly appearing, alert but confused Head- normocephalic, atraumatic Eyes-  Sclera clear, conjunctiva pink Ears- hearing intact Oropharynx- clear Neck- supple, no JVP,  R neck CVL in place Lymph- no cervical lymphadenopathy Lungs- Clear to ausculation bilaterally, normal work of breathing Heart- Regular rate and rhythm GI- soft, NT, ND, + BS Extremities- no clubbing, cyanosis, trace edema Skin- no rash or lesion  LABS: Basic Metabolic Panel:  Basename 09/21/11 0500 09/20/11 0905  NA 136 135  K 2.4* 3.3*  CL 100 100  CO2 27 26  GLUCOSE 119* 113*  BUN 13 15  CREATININE 0.68 0.70  CALCIUM 8.7 9.0  MG -- 2.3  PHOS -- 2.4   Liver Function Tests: No results found for this basename: AST:2,ALT:2,ALKPHOS:2,BILITOT:2,PROT:2,ALBUMIN:2 in the last 72 hours No results found for this basename: LIPASE:2,AMYLASE:2 in the last 72 hours CBC:  Basename 09/21/11 0500 09/20/11 0905  WBC 8.8 7.6  NEUTROABS -- 4.6  HGB 11.1* 10.8*  HCT 31.5* 31.3*  MCV 97.5 97.5  PLT 241 251   Thyroid Function Tests:  Basename 09/19/11 1648  TSH 2.694  T4TOTAL --  T3FREE --  THYROIDAB --   Echo reviewed  09/20/2011  *RADIOLOGY REPORT*   Clinical Data: Pneumonia  PORTABLE CHEST - 1 VIEW  Comparison: 09/15/2011  Findings: A right internal jugular vein temporary pacemaker has been placed.  Tip projects over the right ventricle.  Heart is moderately enlarged.  Vascular congestion has developed.  Bibasilar haziness worrisome for developing airspace disease or edema is worse.  No pneumothorax.  IMPRESSION: Right internal jugular vein temporary pacemaker tip projects over the right ventricle and no pneumothorax  Increasing vascular congestion and bibasilar airspace disease worrisome for edema.  Original Report Authenticated By: Donavan Burnet, M.D.    ASSESSMENT AND PLAN:   1.  Vagal episode with prolonged PP intervals and transient AV dissociation-  No further episodes  I agree with Dr Graciela Husbands, that looking for reversible causes/ treating cause of increased vagal tone is appropriate.  Will DC temporary wire today but leave cortis in place today  Would observe in step down one more day, if rhythm remains stable and once electrolytes are replete, we will then remove CVL and transfer to telemetry at that time 2.  Prolonged QT-  Replete K and Mg aggressively 3.  Delirium- gradually improving 4.  Very low K- replace, replete Mg if needed  Other issues per primary team   Hillis Range, MD 09/21/2011 9:28 AM

## 2011-09-22 LAB — BASIC METABOLIC PANEL
CO2: 27 mEq/L (ref 19–32)
Calcium: 9.2 mg/dL (ref 8.4–10.5)
Calcium: 9.8 mg/dL (ref 8.4–10.5)
Chloride: 101 mEq/L (ref 96–112)
Chloride: 104 mEq/L (ref 96–112)
Creatinine, Ser: 0.66 mg/dL (ref 0.50–1.10)
Creatinine, Ser: 0.85 mg/dL (ref 0.50–1.10)
GFR calc Af Amer: 72 mL/min — ABNORMAL LOW (ref >90)
Glucose, Bld: 112 mg/dL — ABNORMAL HIGH (ref 70–99)
Sodium: 139 mEq/L (ref 135–145)

## 2011-09-22 LAB — MAGNESIUM: Magnesium: 2.1 mg/dL (ref 1.5–2.5)

## 2011-09-22 MED ORDER — POTASSIUM CHLORIDE CRYS ER 20 MEQ PO TBCR
40.0000 meq | EXTENDED_RELEASE_TABLET | Freq: Two times a day (BID) | ORAL | Status: AC
  Administered 2011-09-22 (×2): 40 meq via ORAL
  Filled 2011-09-22: qty 2

## 2011-09-22 MED ORDER — POTASSIUM CHLORIDE 10 MEQ/50ML IV SOLN
INTRAVENOUS | Status: AC
  Administered 2011-09-22: 10 meq via INTRAVENOUS
  Filled 2011-09-22: qty 50

## 2011-09-22 MED ORDER — POTASSIUM CHLORIDE 10 MEQ/50ML IV SOLN
10.0000 meq | INTRAVENOUS | Status: AC
  Administered 2011-09-22 (×3): 10 meq via INTRAVENOUS
  Filled 2011-09-22 (×2): qty 50

## 2011-09-22 MED ORDER — POTASSIUM CHLORIDE CRYS ER 20 MEQ PO TBCR
EXTENDED_RELEASE_TABLET | ORAL | Status: AC
  Administered 2011-09-22: 40 meq via ORAL
  Filled 2011-09-22: qty 2

## 2011-09-22 NOTE — Progress Notes (Signed)
Subjective: Patient is awake and communicative. She has no complaints. Slightly confused to events of past few days but is able to tell me the year, that she is in the hospital and was brought here for feeling weak.   Objective: Patient Vitals for the past 24 hrs:  BP Temp Temp src Pulse Resp SpO2 Weight  09/22/11 0900 134/66 mmHg - - 96  18  95 % -  09/22/11 0800 118/60 mmHg - - 88  17  96 % -  09/22/11 0737 - - - - - 98 % -  09/22/11 0700 - - - 116  21  99 % -  09/22/11 0600 140/60 mmHg - - 95  17  96 % -  09/22/11 0500 126/62 mmHg - - 87  18  96 % -  09/22/11 0400 128/59 mmHg - - 84  17  95 % -  09/22/11 0300 123/78 mmHg - - 110  21  99 % 74.1 kg (163 lb 5.8 oz)  09/22/11 0200 135/79 mmHg - - 104  21  94 % -  09/22/11 0100 108/65 mmHg - - 113  22  93 % -  09/22/11 0000 153/81 mmHg 98.7 F (37.1 C) Oral 106  23  94 % -  09/21/11 2300 135/72 mmHg - - 101  18  93 % -  09/21/11 2200 140/72 mmHg - - 108  22  96 % -  09/21/11 2100 126/80 mmHg - - 97  24  95 % -  09/21/11 2000 141/69 mmHg 98.3 F (36.8 C) Oral 100  20  97 % -  09/21/11 1900 137/67 mmHg - - 101  19  98 % -  09/21/11 1629 - - - - - 100 % -  09/21/11 1600 137/103 mmHg 98.4 F (36.9 C) Oral 110  21  98 % -  09/21/11 1516 126/69 mmHg - - 110  20  97 % -  09/21/11 1510 - - - 116  23  98 % -  09/21/11 1500 - - - 123  22  92 % -  09/21/11 1400 124/73 mmHg - - 95  19  95 % -  09/21/11 1300 118/67 mmHg - - 97  19  95 % -  09/21/11 1200 133/69 mmHg 98.4 F (36.9 C) Oral 112  22  98 % -  09/21/11 1104 - - - - - 100 % -  09/21/11 1100 138/86 mmHg - - 115  24  99 % -   Weight change:   Intake/Output Summary (Last 24 hours) at 09/22/11 1034 Last data filed at 09/22/11 0835  Gross per 24 hour  Intake   1220 ml  Output    476 ml  Net    744 ml    Physical Exam:  General appearance: alert, cooperative and no distress PULM: clear, normal respiratory effort CV: RRR, no M/R/G AB: BS+, soft, non tender, non distended Ext:  warm, no edema, no clubbing, no cyanosis  Derm: no rash or skin breakdown  Neuro: CN II-XII intact, strength 5/5 in all 4 extremities.  Psych:AxOx3. Talking. Pleasant  Lab Results:  Basename 09/22/11 0500 09/21/11 1815 09/20/11 0905  NA 136 134* --  K 3.3* 3.6 --  CL 101 99 --  CO2 27 26 --  GLUCOSE 112* 145* --  BUN 9 11 --  CREATININE 0.66 0.78 --  CALCIUM 9.2 9.5 --  MG 2.1 -- 2.3  PHOS 2.1* -- 2.4   No results found for this  basename: AST:2,ALT:2,ALKPHOS:2,BILITOT:2,PROT:2,ALBUMIN:2 in the last 72 hours No results found for this basename: LIPASE:2,AMYLASE:2 in the last 72 hours  Basename 09/21/11 0500 09/20/11 0905  WBC 8.8 7.6  NEUTROABS -- 4.6  HGB 11.1* 10.8*  HCT 31.5* 31.3*  MCV 97.5 97.5  PLT 241 251   No results found for this basename: CKTOTAL:3,CKMB:3,CKMBINDEX:3,TROPONINI:3 in the last 72 hours No results found for this basename: POCBNP:3 in the last 72 hours No results found for this basename: DDIMER:2 in the last 72 hours No results found for this basename: HGBA1C:2 in the last 72 hours No results found for this basename: CHOL:2,HDL:2,LDLCALC:2,TRIG:2,CHOLHDL:2,LDLDIRECT:2 in the last 72 hours  Basename 09/19/11 1648  TSH 2.694  T4TOTAL --  T3FREE --  THYROIDAB --   No results found for this basename: VITAMINB12:2,FOLATE:2,FERRITIN:2,TIBC:2,IRON:2,RETICCTPCT:2 in the last 72 hours  Micro Results: Recent Results (from the past 240 hour(s))  URINE CULTURE     Status: Normal   Collection Time   09/18/11  3:12 AM      Component Value Range Status Comment   Specimen Description URINE, CLEAN CATCH   Final    Special Requests NONE   Final    Setup Time [id number]   Final    Colony Count NO GROWTH   Final    Culture NO GROWTH   Final    Report Status 09/19/2011 FINAL   Final   MRSA PCR SCREENING     Status: Normal   Collection Time   09/19/11  4:36 PM      Component Value Range Status Comment   MRSA by PCR NEGATIVE  NEGATIVE  Final      Studies/Results: Dg Chest 2 View  09/15/2011  *RADIOLOGY REPORT*  Clinical Data: Lies weakness  CHEST - 2 VIEW  Comparison: 08/04/2011  Findings: Mild cardiomegaly.  Left midlung calcified granuloma. There are ill-defined opacities at the left base worrisome for patchy airspace disease.  Lungs are otherwise clear.  Pulmonary vascularity is within normal limits.  IMPRESSION: Patchy opacities at the left base worrisome for airspace disease. Follow-up radiographs until resolution are recommended.  Original Report Authenticated By: Donavan Burnet, M.D.   Ct Head Wo Contrast  09/19/2011  *RADIOLOGY REPORT*  Clinical Data: Recent fall, altered mental status.  CT HEAD WITHOUT CONTRAST  Technique:  Contiguous axial images were obtained from the base of the skull through the vertex without contrast.  Comparison: 09/18/2011 MRI  Findings: Periventricular and subcortical white matter hypodensities are most in keeping with chronic microangiopathic change. There is no evidence for acute hemorrhage, hydrocephalus, mass lesion, or abnormal extra-axial fluid collection.  No definite CT evidence for acute infarction.  The visualized paranasal sinuses and mastoid air cells are predominately clear.  No displaced calvarial fracture.  Bilateral lens replacement.  IMPRESSION: No acute intracranial abnormality.  Unchanged white matter hypodensities.  Original Report Authenticated By: Waneta Martins, M.D.   Ct Head Wo Contrast  09/15/2011  *RADIOLOGY REPORT*  Clinical Data: Numbness.  Lightheadedness.  CT HEAD WITHOUT CONTRAST  Technique:  Contiguous axial images were obtained from the base of the skull through the vertex without contrast.  Comparison: None.  Findings: Global atrophy.  Chronic ischemic changes in the periventricular white matter.  No mass effect, midline shift, or acute intracranial hemorrhage.  Mastoid air cells are clear. Visualized paranasal sinuses are clear.  Cranium is intact.  IMPRESSION: No acute  intracranial pathology.  Chronic changes.  Original Report Authenticated By: Donavan Burnet, M.D.   Mr Brain Ilda Basset  Contrast  09/18/2011  *RADIOLOGY REPORT*  Clinical Data: 77 year old female with confusion, dysphasia, unsteady gait.  The patient reports an allergy to contrast (listed in the medical record is iodinated contrast) and declined gadolinium contrast for this exam.  MRI HEAD WITHOUT CONTRAST  Technique:  Multiplanar, multiecho pulse sequences of the brain and surrounding structures were obtained according to standard protocol without intravenous contrast.  Comparison: Head CTs 09/15/2011 and earlier. Neck CT 05/13/2011.  Findings: No restricted diffusion to suggest acute infarction.  No midline shift, mass effect, evidence of mass lesion, ventriculomegaly, extra-axial collection or acute intracranial hemorrhage.  Cervicomedullary junction and pituitary are within normal limits.  Major intracranial vascular flow voids are preserved with a degree of intracranial artery dolichoectasia.  Scattered chronic micro hemorrhages, mostly in the frontal lobe subcortical white matter (series 7 image 12, 13).  Confluent periventricular and central cerebral white matter T2 and FLAIR hyperintensity.  Comparatively mild T2 hyperintensity in the pons. Deep gray matter nuclei and cerebellum within normal limits for age.  Negative visualized cervical spine. Visualized bone marrow signal is within normal limits.  Grossly normal visualized internal auditory structures.  Postoperative changes to the globes. Visualized paranasal sinuses and mastoids are clear.  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 This is stable since 05/13/2011.  IMPRESSION: 1. No acute intracranial abnormality.  Chronic small vessel disease. 2.  Right parotid gland mass is stable since 05/13/2011, but decreased T2 signal is noted and tumors with low signal intensity  on T2-weighted images are more likely to be malignant even when they are well-demarcated.  Recommend ENT follow-up.  Original Report Authenticated By: Harley Hallmark, M.D.   Dg Chest Portable 1 View  09/20/2011  *RADIOLOGY REPORT*  Clinical Data: Pneumonia  PORTABLE CHEST - 1 VIEW  Comparison: 09/15/2011  Findings: A right internal jugular vein temporary pacemaker has been placed.  Tip projects over the right ventricle.  Heart is moderately enlarged.  Vascular congestion has developed.  Bibasilar haziness worrisome for developing airspace disease or edema is worse.  No pneumothorax.  IMPRESSION: Right internal jugular vein temporary pacemaker tip projects over the right ventricle and no pneumothorax  Increasing vascular congestion and bibasilar airspace disease worrisome for edema.  Original Report Authenticated By: Donavan Burnet, M.D.    Medications: Scheduled Meds:   . ALPRAZolam  0.25 mg Oral QHS  . budesonide  0.25 mg Nebulization BID  . buPROPion  100 mg Oral Daily  . calcium carbonate  1 tablet Oral Daily  . cholecalciferol  1,000 Units Oral Daily  . docusate sodium  100 mg Oral BID  . doxycycline  100 mg Oral Q12H  . ipratropium  0.5 mg Nebulization QID  . levothyroxine  100 mcg Oral Daily  . potassium chloride  10 mEq Intravenous Q1 Hr x 6  . potassium chloride  10 mEq Intravenous Once  . potassium chloride  10 mEq Intravenous Q1 Hr x 3  . potassium chloride  20 mEq Oral Q4H  . potassium chloride  40 mEq Oral BID   Continuous Infusions:   . sodium chloride 10 mL/hr at 09/22/11 0835   PRN Meds:.albuterol  Assessment/Plan: This is an 77 y/o female who came to the ER for feeling weak, unsteady. She was diagnoses w/ a Pneumonia and UTI and treated w/ Levquin. On the day that she was to be discharged, she c/o dizziness  and diaphoresis and was noted to have ventricular standstill on telemetry. Epi was given. Transvenous pacer placed. Permanent pacer not needed per EP. Need to  continue replacing K and not give any rate controlling agents.     *Pneumonia, organism unspecified- Was on Levaquin but switched to Doxy and it was suspected by PCCM that Levaquin may have been contributing to delerium.  Day 7/10 of antibiotics.  UTI (urinary tract infection)- resolved. UA and culture negative on 11/7.    Complete heart block/ Sinus bradycardia/ Prolonged QT- Suspected to be due to extrinsic issues/vagal episode. Temporary pacer d/c'd on 11/10 by Dr Johney Frame.  No permanent pacer needed. Replace K and Mg and avoid rate controlling agents.   Hypokalemia- replacing Delirium- resolving COPD DM Hypothyroid  Transfer to telemetry today       LOS: 7 days   [facility] ANWAR 09/22/2011, 10:34 AM

## 2011-09-22 NOTE — Progress Notes (Signed)
SUBJECTIVE: The patient is doing well today.  No further pauses or brady events.  She remains confused.  At this time, she denies shortness of breath, chest pain, or any new concerns.    OBJECTIVE: Physical Exam: Filed Vitals:   09/22/11 0500 09/22/11 0600 09/22/11 0700 09/22/11 0737  BP: 126/62 140/60    Pulse: 87 95 116   Temp:      TempSrc:      Resp: 18 17 21    Height:      Weight:      SpO2: 96% 96% 99% 98%    Intake/Output Summary (Last 24 hours) at 09/22/11 0818 Last data filed at 09/22/11 0700  Gross per 24 hour  Intake   1180 ml  Output    675 ml  Net    505 ml      . ALPRAZolam  0.25 mg Oral QHS  . budesonide  0.25 mg Nebulization BID  . buPROPion  100 mg Oral Daily  . calcium carbonate  1 tablet Oral Daily  . cholecalciferol  1,000 Units Oral Daily  . docusate sodium  100 mg Oral BID  . doxycycline  100 mg Oral Q12H  . ipratropium  0.5 mg Nebulization QID  . levothyroxine  100 mcg Oral Daily  . potassium chloride  10 mEq Intravenous Q1 Hr x 6  . potassium chloride  10 mEq Intravenous Once  . potassium chloride      . potassium chloride  20 mEq Oral Q4H  . DISCONTD: doxycycline  100 mg Oral Q12H  . DISCONTD: guaiFENesin  600 mg Oral BID  . DISCONTD: heparin  5,000 Units Subcutaneous Q8H  . DISCONTD: hydrochlorothiazide  25 mg Oral Daily  . DISCONTD: potassium chloride  40 mEq Oral Q4H  . DISCONTD: sodium chloride  3 mL Intravenous Q12H    Telemetry reveals sinus rhythm (at times slightly tachycardic), no further pauses,    GEN- The patient is elderly appearing, alert but confused Head- normocephalic, atraumatic Eyes-  Sclera clear, conjunctiva pink Ears- hearing intact Oropharynx- clear Neck- supple, no JVP,  R neck CVL in place Lymph- no cervical lymphadenopathy Lungs- Clear to ausculation bilaterally, normal work of breathing Heart- Regular rate and rhythm GI- soft, NT, ND, + BS Extremities- no clubbing, cyanosis, trace edema Skin- no rash or  lesion  LABS: Basic Metabolic Panel:  Basename 09/22/11 0500 09/21/11 1815 09/20/11 0905  NA 136 134* --  K 3.3* 3.6 --  CL 101 99 --  CO2 27 26 --  GLUCOSE 112* 145* --  BUN 9 11 --  CREATININE 0.66 0.78 --  CALCIUM 9.2 9.5 --  MG 2.1 -- 2.3  PHOS 2.1* -- 2.4   CBC:  Basename 09/21/11 0500 09/20/11 0905  WBC 8.8 7.6  NEUTROABS -- 4.6  HGB 11.1* 10.8*  HCT 31.5* 31.3*  MCV 97.5 97.5  PLT 241 251   Thyroid Function Tests:  Basename 09/19/11 1648  TSH 2.694  T4TOTAL --  T3FREE --  THYROIDAB --   Echo reviewed  ASSESSMENT AND PLAN:   1.  Vagal episode with prolonged PP intervals and transient AV dissociation-  No further episodes  I agree with Dr Graciela Husbands, that looking for reversible causes/ treating cause of increased vagal tone is appropriate.  Will DC CVL today  Would transfer to telemetry at that time once K is replete and stable if OK with primary team 2.  Prolonged QT-  Replete K aggressively, avoid QT prolonging medications 3.  Delirium-  gradually improving 4.  Very low K- replace,  5.  Sinus tachycardia- a reactive event,  I would NOT recommend treating sinus tachycardia, avoid calcium channel blockers, digoxin, and beta blockers!  Other issues per primary team   Hillis Range, MD 09/22/2011 8:18 AM

## 2011-09-23 LAB — BASIC METABOLIC PANEL
CO2: 26 mEq/L (ref 19–32)
Calcium: 9.6 mg/dL (ref 8.4–10.5)
GFR calc non Af Amer: 77 mL/min — ABNORMAL LOW (ref >90)
Sodium: 137 mEq/L (ref 135–145)

## 2011-09-23 MED ORDER — MAGNESIUM CITRATE PO SOLN: 296.0000 mL | Freq: Once | ORAL | Status: DC

## 2011-09-23 MED ORDER — BISACODYL 10 MG RE SUPP: 10.0000 mg | Freq: Every day | RECTAL | Status: DC | PRN

## 2011-09-23 MED ORDER — MAGNESIUM CITRATE PO SOLN
296.0000 mL | Freq: Once | ORAL | Status: AC
  Administered 2011-09-23: 296 mL via ORAL
  Filled 2011-09-23 (×2): qty 296

## 2011-09-23 MED ORDER — DOCUSATE SODIUM 100 MG PO CAPS
100.0000 mg | ORAL_CAPSULE | Freq: Two times a day (BID) | ORAL | Status: DC
  Administered 2011-09-23 – 2011-09-24 (×3): 100 mg via ORAL
  Filled 2011-09-23 (×4): qty 1

## 2011-09-23 NOTE — Progress Notes (Signed)
Subjective: Alert an OOB to chair. Asking when can she go home. She denies chest pain or shortness of breath. Husband at bedside and updated on status with permission of the patient. Patient says she is bored. Denies nausea vomiting abdominal pain fevers or chills.  Objective: Vital signs in last 24 hours: Temp:  [98 F (36.7 C)-99.6 F (37.6 C)] 99 F (37.2 C) (11/12 1200) Pulse Rate:  [81-104] 103  (11/12 1000) Resp:  [16-23] 18  (11/12 1200) BP: (114-150)/(55-85) 119/63 mmHg (11/12 1200) SpO2:  [90 %-98 %] 92 % (11/12 1000) Weight:  [73.6 kg (162 lb 4.1 oz)] 162 lb 4.1 oz (73.6 kg) (11/12 0400) Weight change: -0.5 kg (-1 lb 1.6 oz) Last BM Date: 09/20/11  Intake/Output from previous day: 11/11 0701 - 11/12 0700 In: 414.2 [P.O.:120; I.V.:144.2; IV Piggyback:150] Out: 1601 [Urine:1601] Intake/Output this shift: Total I/O In: 480 [P.O.:480] Out: 300 [Urine:300]  General appearance: alert, cooperative, appears stated age and no distress Resp: clear to auscultation bilaterally, room air, saturations 92-95% Cardio: regular rate and rhythm, S1, S2 normal, no murmur, click, rub or gallop, pulse rates 90-low 100's. normotensive GI: soft, non-tender; bowel sounds normal; no masses,  no organomegaly, tolerating solid diet. Extremities: extremities normal, atraumatic, no cyanosis or edema Neurologic: Alert and oriented X 3, normal strength and tone. Normal symmetric reflexes. Normal coordination and gait  Lab Results:  Basename 09/21/11 0500  WBC 8.8  HGB 11.1*  HCT 31.5*  PLT 241   BMET  Basename 09/23/11 0523 09/22/11 1515  NA 137 139  K 4.3 4.4  CL 105 104  CO2 26 28  GLUCOSE 106* 130*  BUN 12 11  CREATININE 0.75 0.85  CALCIUM 9.6 9.8    Studies/Results: No results found.  Medications: I have reviewed the patient's current medications.  Assessment/Plan:  Principal Problem:  *Pneumonia, organism unspecified Main problem at admission 09/15/2011. Completed  Doxycycline treatment course.  Active Problems:  Complete heart block/sick sinus syndrome Stable. Onset of symptoms occurred on previous planned date of dc 11/8. Apparently had been symptomatic the night before- had fallen but did not tell the nurse. Initial treatment was atropine and TV pacer. Cards recommends not treating tachycardia and feels etiology due to vagal episode with prolonged PP intervals and transient AV dissociation.   Sinus bradycardia See above   Hypokalemia Given recent bradycardia episodes cards recommends keeping potassium at least >/= 4.0. Oral repletion.  Recheck in a.m.   Diabetes mellitus type II, controlled Diet controlled pre-admit- CBG <180  Constipation PO colace and magnesium citrate today   UTI (urinary tract infection) On Vibramycin- urine culture 09/18/2011 negative   Episode of dizziness Due to symptomatic bradycardia   Delirium Resolved   COPD (chronic obstructive pulmonary disease) with emphysema Not exacerbated. Continue nebs (Pulmicort and albuterol) and  and mobility.   Parotid mass No symptoms   Hypothyroidism TSH normal this admission- continue Synthroid  DVT prophylaxis Ambulatory  Disposition Transfer to telemetry bed. Consider dc next 48 hours if potassium remains stable.      LOS: 8 days   ELLIS,ALLISON L. 09/23/2011, 12:54 PM  I have personally examined this patient and reviewed the entire database. I have reviewed the above note, made any necessary editorial changes, and agree with its content. We will transfer the patient to telemetry bed and continue to watch her potassium. We will advance her activity anticipate that she will be ready for discharge within 24-48 hours.  Lonia Blood, MD [facility]

## 2011-09-23 NOTE — Progress Notes (Signed)
Physical Therapy Treatment Patient Details Name: Paula [name] MRN: [id number] DOB: 11-18-1926 Today's Date: 09/23/2011  PT Assessment/Plan  PT - Assessment/Plan Comments on Treatment Session: Patient is progressing with ability to ambulate with 1 person assist with RW.  Husband feels comfortable taking pt. home when MD agrees she is ready.   PT Plan: Discharge plan remains appropriate Follow Up Recommendations: Home health PT Equipment Recommended: Rolling walker with 5" wheels;3 in 1 bedside comode PT Goals  Acute Rehab PT Goals Pt will go Sit to Supine/Side:  (Not assessed) PT Goal: Sit to Supine/Side - Progress:  (Not assessed) PT Goal: Stand - Progress: Progressing toward goal PT Goal: Ambulate - Progress: Progressing toward goal PT Goal: Up/Down Stairs - Progress: Other (comment) (Not assessed)  PT Treatment Precautions/Restrictions  Precautions Precautions: Fall Restrictions Weight Bearing Restrictions: No Mobility (including Balance) Bed Mobility Bed Mobility: No Transfers Transfers: Yes Sit to Stand: 4: Min assist;From chair/3-in-1;With armrests Sit to Stand Details (indicate cue type and reason): Patient sitting on potty chair on arrival.  Agreed to ambulate. Stand to Sit: 5: Supervision;To chair/3-in-1;With armrests Stand to Sit Details: Cues for hand placement Ambulation/Gait Ambulation/Gait: Yes Ambulation/Gait Assistance: 1: +2 Total assist;Patient percentage (comment) (pt. = 75%) Ambulation/Gait Assistance Details (indicate cue type and reason): Pt. shuffles her feet with RW and with 2 person handheld assist.  Pt's cadence picked up a little with HHA.  Pt's husband states that pt. shuffled her feet prior to admit.  Pt. needs more training with RW.  Husband states he is always there to assist at home. Ambulation Distance (Feet): 120 Feet Assistive device: Rolling walker;2 person hand held assist Gait Pattern: Shuffle Stairs: No Wheelchair Mobility Wheelchair  Mobility: No  Balance Balance Assessed: No Exercise    End of Session PT - End of Session Equipment Utilized During Treatment: Gait belt Activity Tolerance: Patient tolerated treatment well Patient left: in chair;with call bell in reach;with family/visitor present Nurse Communication: Mobility status for ambulation General Behavior During Session: [facility] for tasks performed Cognition: [facility] for tasks performed Cognitive Impairment: Patient demonstrated no agitation today.  Patient does demonstrate poor safetly awareness at times as well as poor memory.  Pt. very pleasant and agreeable today.  Husband present today as well.  Followed simple commands consistently today.    INGOLD,Prabhnoor Ellenberger 09/23/2011, 1:17 PM

## 2011-09-24 LAB — BASIC METABOLIC PANEL
BUN: 16 mg/dL (ref 6–23)
Calcium: 9.9 mg/dL (ref 8.4–10.5)
GFR calc Af Amer: 73 mL/min — ABNORMAL LOW (ref >90)
GFR calc non Af Amer: 63 mL/min — ABNORMAL LOW (ref >90)
Glucose, Bld: 121 mg/dL — ABNORMAL HIGH (ref 70–99)
Potassium: 4.4 mEq/L (ref 3.5–5.1)
Sodium: 139 mEq/L (ref 135–145)

## 2011-09-24 LAB — GLUCOSE, CAPILLARY

## 2011-09-24 NOTE — Discharge Summary (Signed)
The patient has been examined and chart reviewed.  She is stable for discharge. I agree with the above discharge summary and plan.

## 2011-09-24 NOTE — Progress Notes (Signed)
09/24/2011  2:19 PM. Accompanied by Husband. Belongings reviewed. D/C instruction explained and verbalized understanding by pt and husband. Questions answered to the best of author's knowledge.

## 2011-09-24 NOTE — Progress Notes (Signed)
SUBJECTIVE: The patient is doing well today.  No further pauses or brady events.   At this time, she denies shortness of breath, chest pain, or any new concerns.     OBJECTIVE: Physical Exam: Filed Vitals:   09/23/11 1659 09/23/11 1953 09/23/11 2100 09/24/11 0409  BP: 125/82  128/70 121/74  Pulse: 94 98 90 92  Temp: 98.3 F (36.8 C)  98.5 F (36.9 C) 99.5 F (37.5 C)  TempSrc: Oral  Oral Oral  Resp: 20 18 20 18   Height:      Weight:      SpO2: 95%  90% 94%    Intake/Output Summary (Last 24 hours) at 09/24/11 0937 Last data filed at 09/23/11 1300  Gross per 24 hour  Intake    600 ml  Output      0 ml  Net    600 ml      . ALPRAZolam  0.25 mg Oral QHS  . budesonide  0.25 mg Nebulization BID  . buPROPion  100 mg Oral Daily  . calcium carbonate  1 tablet Oral Daily  . cholecalciferol  1,000 Units Oral Daily  . docusate sodium  100 mg Oral BID  . ipratropium  0.5 mg Nebulization QID  . levothyroxine  100 mcg Oral Daily  . magnesium citrate  296 mL Oral Once  . DISCONTD: doxycycline  100 mg Oral Q12H    Telemetry reveals sinus rhythm (at times slightly tachycardic), no further pauses,    GEN- The patient is elderly appearing, alert Head- normocephalic, atraumatic Eyes-  Sclera clear, conjunctiva pink Ears- hearing intact Oropharynx- clear Neck- supple, no JVP, Lymph- no cervical lymphadenopathy Lungs- Clear to ausculation bilaterally, normal work of breathing Heart- Regular rate and rhythm GI- soft, NT, ND, + BS Extremities- no clubbing, cyanosis, trace edema Skin- no rash or lesion  LABS: Basic Metabolic Panel:  Basename 09/24/11 0712 09/23/11 0523 09/22/11 0500  NA 139 137 --  K 4.4 4.3 --  CL 103 105 --  CO2 27 26 --  GLUCOSE 121* 106* --  BUN 16 12 --  CREATININE 0.84 0.75 --  CALCIUM 9.9 9.6 --  MG -- -- 2.1  PHOS -- -- 2.1*    ASSESSMENT AND PLAN:   1.  Vagal episode with prolonged PP intervals and transient AV dissociation-  No further  episodes  I agree with Dr Graciela Husbands, that looking for reversible causes/ treating cause of increased vagal tone is appropriate.  No further inpatient EP evaluation planned.  She does not drive.  Follow-up with Dr Graciela Husbands in 3-4 weeks as an outpatient 2.  Sinus tachycardia- a reactive event,  I would NOT recommend treating sinus tachycardia, avoid calcium channel blockers, digoxin, and beta blockers!  Other issues per primary team Will see as needed while here.  Call with questions.   Hillis Range, MD 09/24/2011 9:37 AM

## 2011-09-24 NOTE — Discharge Summary (Signed)
DISCHARGE SUMMARY  Paula [name]  MR#: [id number]  DOB:05/04/1927  Date of Admission: 09/15/2011 Date of Discharge: 09/24/2011  Attending Physician:Bobie Kistler L.  Patient's YQM:[id number],UXLKG, MD, MD  Consults: Luis Abed, MD Cardiology Kalman Shan, MD Pulmonary Critical Care Medicine  Discharge Diagnoses: Principal Problem:  *Pneumonia, organism unspecified Active Problems:  Complete heart block  Sinus bradycardia  Hypokalemia  Diabetes mellitus type II, controlled  UTI (urinary tract infection)  Episode of dizziness  Delirium  COPD (chronic obstructive pulmonary disease) with emphysema  Parotid mass  Hypothyroidism   Radiology: Dg Chest 2 View  09/15/2011  *RADIOLOGY REPORT*  Clinical Data: Lies weakness  CHEST - 2 VIEW  Comparison: 08/04/2011  Findings: Mild cardiomegaly.  Left midlung calcified granuloma. There are ill-defined opacities at the left base worrisome for patchy airspace disease.  Lungs are otherwise clear.  Pulmonary vascularity is within normal limits.  IMPRESSION: Patchy opacities at the left base worrisome for airspace disease. Follow-up radiographs until resolution are recommended.  Original Report Authenticated By: Donavan Burnet, M.D.   Ct Head Wo Contrast  09/19/2011  *RADIOLOGY REPORT*  Clinical Data: Recent fall, altered mental status.  CT HEAD WITHOUT CONTRAST  Technique:  Contiguous axial images were obtained from the base of the skull through the vertex without contrast.  Comparison: 09/18/2011 MRI  Findings: Periventricular and subcortical white matter hypodensities are most in keeping with chronic microangiopathic change. There is no evidence for acute hemorrhage, hydrocephalus, mass lesion, or abnormal extra-axial fluid collection.  No definite CT evidence for acute infarction.  The visualized paranasal sinuses and mastoid air cells are predominately clear.  No displaced calvarial fracture.  Bilateral lens replacement.  IMPRESSION:  No acute intracranial abnormality.  Unchanged white matter hypodensities.  Original Report Authenticated By: Waneta Martins, M.D.   Ct Head Wo Contrast  09/15/2011  *RADIOLOGY REPORT*  Clinical Data: Numbness.  Lightheadedness.  CT HEAD WITHOUT CONTRAST  Technique:  Contiguous axial images were obtained from the base of the skull through the vertex without contrast.  Comparison: None.  Findings: Global atrophy.  Chronic ischemic changes in the periventricular white matter.  No mass effect, midline shift, or acute intracranial hemorrhage.  Mastoid air cells are clear. Visualized paranasal sinuses are clear.  Cranium is intact.  IMPRESSION: No acute intracranial pathology.  Chronic changes.  Original Report Authenticated By: Donavan Burnet, M.D.   Mr Brain Wo Contrast  09/18/2011  *RADIOLOGY REPORT*  Clinical Data: 77 year old female with confusion, dysphasia, unsteady gait.  The patient reports an allergy to contrast (listed in the medical record is iodinated contrast) and declined gadolinium contrast for this exam.  MRI HEAD WITHOUT CONTRAST  Technique:  Multiplanar, multiecho pulse sequences of the brain and surrounding structures were obtained according to standard protocol without intravenous contrast.  Comparison: Head CTs 09/15/2011 and earlier. Neck CT 05/13/2011.  Findings: No restricted diffusion to suggest acute infarction.  No midline shift, mass effect, evidence of mass lesion, ventriculomegaly, extra-axial collection or acute intracranial hemorrhage.  Cervicomedullary junction and pituitary are within normal limits.  Major intracranial vascular flow voids are preserved with a degree of intracranial artery dolichoectasia.  Scattered chronic micro hemorrhages, mostly in the frontal lobe subcortical white matter (series 7 image 12, 13).  Confluent periventricular and central cerebral white matter T2 and FLAIR hyperintensity.  Comparatively mild T2 hyperintensity in the pons. Deep gray matter  nuclei and cerebellum within normal limits for age.  Negative visualized cervical spine. Visualized bone marrow signal is within normal  limits.  Grossly normal visualized internal auditory structures.  Postoperative changes to the globes. Visualized paranasal sinuses and mastoids are clear.  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 This is stable since 05/13/2011.  IMPRESSION: 1. No acute intracranial abnormality.  Chronic small vessel disease. 2.  Right parotid gland mass is stable since 05/13/2011, but decreased T2 signal is noted and tumors with low signal intensity on T2-weighted images are more likely to be malignant even when they are well-demarcated.  Recommend ENT follow-up.  Original Report Authenticated By: Harley Hallmark, M.D.   Dg Chest Portable 1 View  09/20/2011  *RADIOLOGY REPORT*  Clinical Data: Pneumonia  PORTABLE CHEST - 1 VIEW  Comparison: 09/15/2011  Findings: A right internal jugular vein temporary pacemaker has been placed.  Tip projects over the right ventricle.  Heart is moderately enlarged.  Vascular congestion has developed.  Bibasilar haziness worrisome for developing airspace disease or edema is worse.  No pneumothorax.  IMPRESSION: Right internal jugular vein temporary pacemaker tip projects over the right ventricle and no pneumothorax  Increasing vascular congestion and bibasilar airspace disease worrisome for edema.  Original Report Authenticated By: Donavan Burnet, M.D.    Laboratory: Results for orders placed during the hospital encounter of 09/15/11 (from the past 48 hour(s))  BASIC METABOLIC PANEL     Status: Abnormal   Collection Time   09/22/11  3:15 PM      Component Value Range Comment   Sodium 139  135 - 145 (mEq/L)    Potassium 4.4  3.5 - 5.1 (mEq/L) NO VISIBLE HEMOLYSIS   Chloride 104  96 - 112 (mEq/L)    CO2 28  19 - 32 (mEq/L)    Glucose, Bld 130 (*) 70 - 99 (mg/dL)     BUN 11  6 - 23 (mg/dL)    Creatinine, Ser 1.61  0.50 - 1.10 (mg/dL)    Calcium 9.8  8.4 - 10.5 (mg/dL)    GFR calc non Af Amer 62 (*) >90 (mL/min)    GFR calc Af Amer 72 (*) >90 (mL/min)   BASIC METABOLIC PANEL     Status: Abnormal   Collection Time   09/23/11  5:23 AM      Component Value Range Comment   Sodium 137  135 - 145 (mEq/L)    Potassium 4.3  3.5 - 5.1 (mEq/L)    Chloride 105  96 - 112 (mEq/L)    CO2 26  19 - 32 (mEq/L)    Glucose, Bld 106 (*) 70 - 99 (mg/dL)    BUN 12  6 - 23 (mg/dL)    Creatinine, Ser 0.96  0.50 - 1.10 (mg/dL)    Calcium 9.6  8.4 - 10.5 (mg/dL)    GFR calc non Af Amer 77 (*) >90 (mL/min)    GFR calc Af Amer 89 (*) >90 (mL/min)   BASIC METABOLIC PANEL     Status: Abnormal   Collection Time   09/24/11  7:12 AM      Component Value Range Comment   Sodium 139  135 - 145 (mEq/L)    Potassium 4.4  3.5 - 5.1 (mEq/L)    Chloride 103  96 - 112 (mEq/L)    CO2 27  19 - 32 (mEq/L)    Glucose, Bld 121 (*) 70 - 99 (mg/dL)    BUN 16  6 - 23 (mg/dL)  Creatinine, Ser 0.84  0.50 - 1.10 (mg/dL)    Calcium 9.9  8.4 - 10.5 (mg/dL)    GFR calc non Af Amer 63 (*) >90 (mL/min)    GFR calc Af Amer 73 (*) >90 (mL/min)   GLUCOSE, CAPILLARY     Status: Abnormal   Collection Time   09/24/11 11:07 AM      Component Value Range Comment   Glucose-Capillary 120 (*) 70 - 99 (mg/dL)    Comment 1 Notify RN      Comment 2 Documented in Chart        Current Discharge Medication List    CONTINUE these medications which have NOT CHANGED   Details  ALPRAZolam (XANAX) 0.25 MG tablet Take 0.25 mg by mouth at bedtime as needed. TAKES 1 TAB IN THE MORNING IN 2 TABS IN THE EVENING    amitriptyline (ELAVIL) 10 MG tablet Take 10 mg by mouth at bedtime.      buPROPion (WELLBUTRIN) 100 MG tablet Take 100 mg by mouth daily.     calcium carbonate (OS-CAL) 600 MG TABS Take 600 mg by mouth daily.      cholecalciferol (VITAMIN D) 1000 UNITS tablet Take 1,000 Units by mouth daily.       levothyroxine (SYNTHROID, LEVOTHROID) 100 MCG tablet Take 100 mcg by mouth daily.      Lutein 10 MG TABS Take 1 tablet by mouth daily.        STOP taking these medications     hydrochlorothiazide (HYDRODIURIL) 25 MG tablet      perphenazine (TRILAFON) 4 MG tablet           Hospital Course: Principal Problem:  *Pneumonia, organism unspecified She presented to the ER with complaints of weakness and unsteadiness for 3 days. She denied nausea vomiting or chills. She did endorse dyspnea on exertion upon presentation. Her initial chest x-ray demonstrated patchy opacities at the left base worrisome for airspace disease. Her initial physical exam from a pulmonary standpoint was unremarkable. She was maintaining O2 saturations of 96% at presentation and it is not documented whether she was on oxygen at that time. She was admitted with a diagnosis of community-acquired pneumonia. She was treated empirically with doxycycline with improvement in dyspnea and exertion. This medication has been completed.  Active Problems:  Complete heart block/Sinus bradycardia/Episode of dizziness This is a new problem that developed this hospitalization. Onset of symptoms occurred prior to planned discharge for 09/19/2011. Apparently the patient may have been symptomatic the night before. The fall was described and the patient did not report this to the nurse. The patient's husband describes the fall as being related to imbalance and unsteadiness. Patient was eventually noted to have bradycardic arrhythmia on telemetry and was given atropine. She was symptomatic with this with hypotension and later required a transvenous pacemaker and cardiology consultation. Cardiology felt that the etiology to the bradycardia was due to a vagal episode with prolonged PP intervals and transient A-V dissociation. Cardiology recommends not treating any tachycardia. She is to followup with Dr. Graciela Husbands in 3-4 weeks after discharge she is  to call for appointment.   Hypokalemia This is a new problem this admission. Patient had a nadir of potassium of 2.4 this admission. Please note that this patient was on hydrochlorothiazide prior to admission. This medication was discontinued in the setting of the hypokalemia this admission. Given her recent bradycardic episodes cardiology consult since recommend keeping the potassium at least 4.0. Low potassium was treated this admission with  a combination of IV and oral repletion. Do to the concomitant hypokalemia as well as bradycardic episodes hydrochlorothiazide was not continued at discharge.   Diabetes mellitus type II, controlled Patient has diet controlled diabetes preadmission CBGs have remained less than 180 this admission.   UTI (urinary tract infection) At presentation this patient did have an abnormal urinalysis that was consistent with a probable urinary tract infection. Subsequent urine cultures are were negative.   Delirium This patient had an episode of acute delirium related to her episodic complete heart block and bradycardia. The symptoms have resolved.   COPD (chronic obstructive pulmonary disease) with emphysema Her COPD remained stable and not exacerbated this admission. She was treated with prn nebulizer therapy of Pulmicort and albuterol this admission. These medications will not be continued after discharge.   Parotid mass This is a chronic problem and has not addressed this admission.   Hypothyroidism TSH was normal this admission usual Synthroid dose was continued this hospitalization.   Day of Discharge BP 107/71  Pulse 85  Temp(Src) 98.7 F (37.1 C) (Oral)  Resp 20  Ht 5\' 6"  (1.676 m)  Wt 73.6 kg (162 lb 4.1 oz)  BMI 26.19 kg/m2  SpO2 95%  Physical Exam: General appearance: alert, cooperative, appears stated age and no distress  Resp: clear to auscultation bilaterally, room air, saturations 92-95%  Cardio: regular rate and rhythm, S1, S2 normal, no  murmur, click, rub or gallop, pulse rates 90-low 100's. normotensive  GI: soft, non-tender; bowel sounds normal; no masses, no organomegaly, tolerating solid diet.  Extremities: extremities normal, atraumatic, no cyanosis or edema  Neurologic: Alert and oriented X 3, normal strength and tone. Normal symmetric reflexes. Normal coordination and gait   Follow-up: She is to followup with her primary care physician Dr. Nicholos Johns in the next 1-2 weeks to have her potassium rechecked and to determine if it is safe to resume her hydrochlorothiazide. Please note this patient states she was started on hydrochlorothiazide to keep blood pressure low in the setting of a known abdominal aortic aneurysm.  In addition to following up with her primary care physician, Dr. Graciela Husbands with cardiology wishes to see the patient in the next 3-4 weeks she is to call for an appointment this is in regards to followup for her symptomatic bradycardia this admission.  Disposition: Stable at discharge physical therapy recommend she continue home health and other assistive devices such as a rolling walker these will be ordered at discharge. Physical therapy is also recommended a 3 in one bedside commode. A personal care assistant as well as an Charity fundraiser for followup has also been recommended and these will be ordered.

## 2011-09-24 NOTE — Progress Notes (Signed)
Attempted to see pt. For treatment, however pt. And pt's husband report pt. Did not sleep last night and both request that pt. Rest. Will re attempt to see pt. Later today as schedule permits. Thanks!  Cassandria Anger, OTR/L Pager: [phone] 09/24/2011 .

## 2011-10-05 ENCOUNTER — Emergency Department (HOSPITAL_COMMUNITY): Payer: Medicare Other

## 2011-10-05 ENCOUNTER — Encounter (HOSPITAL_COMMUNITY): Payer: Self-pay | Admitting: *Deleted

## 2011-10-05 ENCOUNTER — Other Ambulatory Visit: Payer: Self-pay

## 2011-10-05 ENCOUNTER — Observation Stay (HOSPITAL_COMMUNITY)
Admission: EM | Admit: 2011-10-05 | Discharge: 2011-10-07 | Disposition: A | Discharge status: Home / Self Care | Payer: Medicare Other | Attending: Internal Medicine | Admitting: Internal Medicine

## 2011-10-05 DIAGNOSIS — E876 Hypokalemia: Secondary | ICD-10-CM | POA: Insufficient documentation

## 2011-10-05 DIAGNOSIS — J439 Emphysema, unspecified: Secondary | ICD-10-CM | POA: Diagnosis present

## 2011-10-05 DIAGNOSIS — I5021 Acute systolic (congestive) heart failure: Secondary | ICD-10-CM | POA: Insufficient documentation

## 2011-10-05 DIAGNOSIS — I714 Abdominal aortic aneurysm, without rupture, unspecified: Secondary | ICD-10-CM | POA: Insufficient documentation

## 2011-10-05 DIAGNOSIS — Z87891 Personal history of nicotine dependence: Secondary | ICD-10-CM | POA: Insufficient documentation

## 2011-10-05 DIAGNOSIS — E1151 Type 2 diabetes mellitus with diabetic peripheral angiopathy without gangrene: Secondary | ICD-10-CM | POA: Diagnosis present

## 2011-10-05 DIAGNOSIS — E039 Hypothyroidism, unspecified: Secondary | ICD-10-CM | POA: Insufficient documentation

## 2011-10-05 DIAGNOSIS — J438 Other emphysema: Principal | ICD-10-CM | POA: Insufficient documentation

## 2011-10-05 DIAGNOSIS — I10 Essential (primary) hypertension: Secondary | ICD-10-CM | POA: Insufficient documentation

## 2011-10-05 DIAGNOSIS — Z9181 History of falling: Secondary | ICD-10-CM | POA: Insufficient documentation

## 2011-10-05 DIAGNOSIS — E119 Type 2 diabetes mellitus without complications: Secondary | ICD-10-CM | POA: Insufficient documentation

## 2011-10-05 DIAGNOSIS — I509 Heart failure, unspecified: Secondary | ICD-10-CM

## 2011-10-05 DIAGNOSIS — F411 Generalized anxiety disorder: Secondary | ICD-10-CM | POA: Insufficient documentation

## 2011-10-05 LAB — BASIC METABOLIC PANEL
BUN: 8 mg/dL (ref 6–23)
Chloride: 104 mEq/L (ref 96–112)
Creatinine, Ser: 0.73 mg/dL (ref 0.50–1.10)
Glucose, Bld: 152 mg/dL — ABNORMAL HIGH (ref 70–99)
Potassium: 3.9 mEq/L (ref 3.5–5.1)

## 2011-10-05 LAB — CBC
MCV: 102.1 fL — ABNORMAL HIGH (ref 78.0–100.0)
Platelets: 374 10*3/uL (ref 150–400)
RBC: 3.35 MIL/uL — ABNORMAL LOW (ref 3.87–5.11)
WBC: 10.2 10*3/uL (ref 4.0–10.5)

## 2011-10-05 LAB — POCT I-STAT TROPONIN I: Troponin i, poc: 0.02 ng/mL (ref 0.00–0.08)

## 2011-10-05 MED ORDER — XENON XE 133 GAS
10.0000 | GAS_FOR_INHALATION | Freq: Once | RESPIRATORY_TRACT | Status: AC | PRN
  Administered 2011-10-05: 10 via RESPIRATORY_TRACT

## 2011-10-05 MED ORDER — NITROGLYCERIN 2 % TD OINT
1.0000 [in_us] | TOPICAL_OINTMENT | Freq: Once | TRANSDERMAL | Status: AC
  Administered 2011-10-05: 1 [in_us] via TOPICAL
  Filled 2011-10-05: qty 1

## 2011-10-05 MED ORDER — FUROSEMIDE 10 MG/ML IJ SOLN
40.0000 mg | Freq: Once | INTRAMUSCULAR | Status: AC
  Administered 2011-10-05: 40 mg via INTRAVENOUS
  Filled 2011-10-05: qty 4

## 2011-10-05 MED ORDER — TECHNETIUM TO 99M ALBUMIN AGGREGATED
6.0000 | Freq: Once | INTRAVENOUS | Status: AC | PRN
  Administered 2011-10-05: 6 via INTRAVENOUS

## 2011-10-05 NOTE — ED Notes (Signed)
Returned from xray

## 2011-10-05 NOTE — ED Provider Notes (Addendum)
History     CSN: [id number] Arrival date & time: 10/05/2011  4:31 PM   First MD Initiated Contact with Patient 10/05/11 1633      Chief Complaint  Patient presents with  . Shortness of Breath   HPI The patient presents with a chief complaint of weakness. She was just discharged from the hospital on Tuesday, November 13.  The discharge diagnoses included Discharge Diagnoses:  Principal Problem:  *Pneumonia, organism unspecified  Active Problems:  Complete heart block  Sinus bradycardia  Hypokalemia  Diabetes mellitus type II, controlled  UTI (urinary tract infection)  Episode of dizziness  Delirium  COPD (chronic obstructive pulmonary disease) with emphysema  Parotid mass  Hypothyroidism Patient states she started to feel short of breath.  Patient states the symptoms started today again. She had been feeling generally okay since her hospitalization. She felt she had slowly been getting better every day. Patient has some discomfort in her left chest. Patient states she always has that however and that is not new. She denies any fevers. She's not had any coughing. Patient denies any focal numbness or weakness. Patient feels worse when she is exerting herself. Nothing seems to make it particularly better and resting. Patient denies any vomiting or diarrhea or other complaints at this time.  Past Medical History  Diagnosis Date  . Diabetes mellitus   . Thyroid disease   . Abdominal aneurysm   . COPD (chronic obstructive pulmonary disease)   . Hypertension   . Shortness of breath   . Hypothyroidism   . Depression   . Recurrent upper respiratory infection (URI)     Past Surgical History  Procedure Date  . Cholecystectomy   . Joint replacement   . Sinus surgery with instatrak   . Appendectomy   . Oophorectomy   . Arthroscopic repair acl     Family History  Problem Relation Age of Onset  . Cancer Father     History  Substance Use Topics  . Smoking status: Former  Smoker    Quit date: 08/31/2011  . Smokeless tobacco: Not on file  . Alcohol Use: No    OB History    Grav Para Term Preterm Abortions TAB SAB Ect Mult Living                  Review of Systems  Neurological:       Patient complains of left-sided facial twitching for years since a root canal procedure  All other systems reviewed and are negative.    Allergies  Sulfa antibiotics; Contrast media; Iohexol; and Penicillins  Home Medications   Current Outpatient Rx  Name Route Sig Dispense Refill  . ALPRAZOLAM 0.25 MG PO TABS Oral Take 0.25 mg by mouth at bedtime as needed. TAKES 1 TAB IN THE MORNING IN 2 TABS IN THE EVENING    . AMITRIPTYLINE HCL 10 MG PO TABS Oral Take 10 mg by mouth at bedtime.      . BUPROPION HCL 100 MG PO TABS Oral Take 100 mg by mouth daily.     Marland Kitchen CALCIUM CARBONATE 600 MG PO TABS Oral Take 600 mg by mouth daily.      Marland Kitchen VITAMIN D 1000 UNITS PO TABS Oral Take 1,000 Units by mouth daily.      Marland Kitchen LEVOTHYROXINE SODIUM 100 MCG PO TABS Oral Take 100 mcg by mouth daily.      . LUTEIN 10 MG PO TABS Oral Take 1 tablet by mouth daily.  SpO2 100%  Physical Exam  Nursing note and vitals reviewed. Constitutional: She appears well-developed and well-nourished. No distress.  HENT:  Head: Normocephalic and atraumatic.  Right Ear: External ear normal.  Left Ear: External ear normal.  Eyes: Conjunctivae are normal. Right eye exhibits no discharge. Left eye exhibits no discharge. No scleral icterus.  Neck: Neck supple. No tracheal deviation present.  Cardiovascular: Normal rate, regular rhythm and intact distal pulses.   Pulmonary/Chest: Effort normal. No stridor. No respiratory distress. She has no wheezes. She has rales (few crackles at the left base).  Abdominal: Soft. Bowel sounds are normal. She exhibits no distension. There is no tenderness. There is no rebound and no guarding.  Musculoskeletal: She exhibits no edema and no tenderness.  Neurological: She is  alert. She has normal strength. No sensory deficit. Cranial nerve deficit:  no gross defecits noted. She exhibits normal muscle tone. She displays no seizure activity. Coordination normal.  Skin: Skin is warm and dry. No rash noted.  Psychiatric: She has a normal mood and affect.    ED Course  Procedures (including critical care time)  Date: 10/05/2011  Rate: 86  Rhythm: normal sinus rhythm  QRS Axis: normal  Intervals: Prolonged QT  ST/T Wave abnormalities: nonspecific T wave changes  Conduction Disutrbances:none  Narrative Interpretation: T wave changes noted are new compared to last EKG  Old EKG Reviewed: No significant changes since last EKG   Medications  xenon xe 133 gas 10 milli Curie (10 milli Curie Inhalation Contrast Given 10/05/11 2020)  technetium albumin aggregated (MAA) injection solution 6 milli Curie (6 milli Curie Intravenous Contrast Given 10/05/11 2030)     Labs Reviewed  PRO B NATRIURETIC PEPTIDE - Abnormal; Notable for the following:    BNP, POC 1329.0 (*)    All other components within normal limits  BASIC METABOLIC PANEL - Abnormal; Notable for the following:    Glucose, Bld 152 (*)    GFR calc non Af Amer 77 (*)    GFR calc Af Amer 90 (*)    All other components within normal limits  CBC - Abnormal; Notable for the following:    RBC 3.35 (*)    Hemoglobin 11.3 (*)    HCT 34.2 (*)    MCV 102.1 (*)    All other components within normal limits  D-DIMER, QUANTITATIVE - Abnormal; Notable for the following:    D-Dimer, Quant 0.80 (*)    All other components within normal limits  POCT I-STAT TROPONIN I  I-STAT TROPONIN I   Dg Chest 2 View  10/05/2011  *RADIOLOGY REPORT*  Clinical Data: Shortness of breath.  History of aortic aneurysm. Recently hospitalized for hypertension.  Ex-smoker as of 6 weeks ago.  CHEST - 2 VIEW  Comparison: 09/19/2011  Findings: Degraded lateral view secondary patient positioning. Underlying hyperinflation.  Mild osteopenia.   Cardiomegaly with atherosclerosis in the transverse aorta.  A small left pleural effusion is decreased since 09/19/2011. No pneumothorax.  Biapical pleural thickening. No congestive failure.  Mild bibasilar volume loss.  IMPRESSION:  1.  Cardiomegaly and hyperinflation, without congestive failure. 2.  Small left pleural effusion.  Original Report Authenticated By: Consuello Bossier, M.D.   Nm Pulmonary Per & Vent  10/05/2011  *RADIOLOGY REPORT*  Clinical Data:  Shortness of breath, increased D-dimer, contrast allergy  NUCLEAR MEDICINE VENTILATION - PERFUSION LUNG SCAN  Technique:  Wash-in, equilibrium, and wash-out phase ventilation images were obtained using Xe-133 gas.  Perfusion images were obtained in multiple  projections after intravenous injection of Tc- 17m MAA.  Radiopharmaceuticals:  10 mCi Xe-133 gas and 6.0 mCi Tc-97m MAA.  Comparison:  None Correlation:  Chest radiograph 10/05/2011  Findings: Ventilation exam in anterior and posterior projections demonstrates a slightly smaller left lung than right. Minimal nonsegmental diminished ventilation at left lung base. No significant xenon retention.  Perfusion lung scan in eight projections shows slightly smaller left lung than right. Minimal nonsegmental diminished perfusion at left lung base. No segmental or subsegmental perfusion defects identified.  Chest radiograph is significant for cardiomegaly and small left pleural effusion.  IMPRESSION: Minimal nonsegmental diminished perfusion and ventilation at left lung base, likely related to small left pleural effusion. Findings represent a very low probability pulmonary embolism.  Original Report Authenticated By: Lollie Marrow, M.D.     No diagnosis found.    MDM  Patient without signs of pulmonary embolism on the VQ scan. She does have small pleural effusion. Her BNP is slightly elevated as well. Patient did have a component of mild congestive heart failure. I will consult tregarding admission for  observation possible chf, angina equivalent..   10:30 PM case discussed with Dr Maryelizabeth Kaufmann.  He will come see in the ED     Celene Kras, MD 10/05/11 2137  Celene Kras, MD 10/05/11 [phone]

## 2011-10-05 NOTE — ED Notes (Signed)
PT was D/C from [facility] on TUES. Pt returns to day because of weakness on RT side.

## 2011-10-05 NOTE — ED Notes (Signed)
Cardiologist at bedside.  

## 2011-10-06 ENCOUNTER — Encounter (HOSPITAL_COMMUNITY): Payer: Self-pay | Admitting: Internal Medicine

## 2011-10-06 LAB — CARDIAC PANEL(CRET KIN+CKTOT+MB+TROPI)
CK, MB: 3.2 ng/mL (ref 0.3–4.0)
Total CK: 39 U/L (ref 7–177)

## 2011-10-06 LAB — BASIC METABOLIC PANEL
BUN: 10 mg/dL (ref 6–23)
CO2: 28 mEq/L (ref 19–32)
Glucose, Bld: 112 mg/dL — ABNORMAL HIGH (ref 70–99)
Potassium: 3.4 mEq/L — ABNORMAL LOW (ref 3.5–5.1)
Sodium: 141 mEq/L (ref 135–145)

## 2011-10-06 LAB — GLUCOSE, CAPILLARY

## 2011-10-06 LAB — CBC
Platelets: 381 10*3/uL (ref 150–400)
RDW: 14.1 % (ref 11.5–15.5)
WBC: 11.8 10*3/uL — ABNORMAL HIGH (ref 4.0–10.5)

## 2011-10-06 LAB — CREATININE, SERUM
GFR calc Af Amer: >90 mL/min (ref >90)
GFR calc non Af Amer: 79 mL/min — ABNORMAL LOW (ref >90)

## 2011-10-06 MED ORDER — AMITRIPTYLINE HCL 10 MG PO TABS
10.0000 mg | ORAL_TABLET | Freq: Every day | ORAL | Status: DC
  Administered 2011-10-06: 10 mg via ORAL
  Filled 2011-10-06 (×2): qty 1

## 2011-10-06 MED ORDER — ALPRAZOLAM 0.25 MG PO TABS
0.2500 mg | ORAL_TABLET | Freq: Every evening | ORAL | Status: DC | PRN
  Administered 2011-10-06: 0.25 mg via ORAL
  Filled 2011-10-06: qty 1

## 2011-10-06 MED ORDER — SODIUM CHLORIDE 0.9 % IV SOLN: 250.0000 mL | INTRAVENOUS | Status: DC

## 2011-10-06 MED ORDER — SODIUM CHLORIDE 0.9 % IJ SOLN
3.0000 mL | Freq: Two times a day (BID) | INTRAMUSCULAR | Status: DC
  Administered 2011-10-06 (×3): 3 mL via INTRAVENOUS

## 2011-10-06 MED ORDER — BIOTENE DRY MOUTH MT LIQD
15.0000 mL | Freq: Two times a day (BID) | OROMUCOSAL | Status: DC
  Administered 2011-10-07: 15 mL via OROMUCOSAL

## 2011-10-06 MED ORDER — BUTALBITAL-APAP-CAFFEINE 50-325-40 MG PO TABS: 1.0000 | ORAL_TABLET | Freq: Four times a day (QID) | ORAL | Status: DC | PRN

## 2011-10-06 MED ORDER — ALPRAZOLAM 0.25 MG PO TABS
0.2500 mg | ORAL_TABLET | Freq: Three times a day (TID) | ORAL | Status: DC | PRN
  Administered 2011-10-06 – 2011-10-07 (×3): 0.25 mg via ORAL
  Filled 2011-10-06 (×3): qty 1

## 2011-10-06 MED ORDER — ALPRAZOLAM 0.25 MG PO TABS
0.2500 mg | ORAL_TABLET | Freq: Once | ORAL | Status: AC
  Administered 2011-10-06: 0.25 mg via ORAL
  Filled 2011-10-06: qty 1

## 2011-10-06 MED ORDER — CALCIUM CARBONATE 1250 (500 CA) MG PO TABS
1.0000 | ORAL_TABLET | Freq: Every day | ORAL | Status: DC
  Administered 2011-10-06 – 2011-10-07 (×2): 500 mg via ORAL
  Filled 2011-10-06 (×2): qty 1

## 2011-10-06 MED ORDER — VITAMIN D3 25 MCG (1000 UNIT) PO TABS
1000.0000 [IU] | ORAL_TABLET | Freq: Every day | ORAL | Status: DC
  Administered 2011-10-06 – 2011-10-07 (×2): 1000 [IU] via ORAL
  Filled 2011-10-06 (×2): qty 1

## 2011-10-06 MED ORDER — FUROSEMIDE 10 MG/ML IJ SOLN
20.0000 mg | Freq: Two times a day (BID) | INTRAMUSCULAR | Status: DC
  Administered 2011-10-06 – 2011-10-07 (×3): 20 mg via INTRAVENOUS
  Filled 2011-10-06 (×4): qty 2

## 2011-10-06 MED ORDER — ENOXAPARIN SODIUM 40 MG/0.4ML ~~LOC~~ SOLN
40.0000 mg | SUBCUTANEOUS | Status: DC
  Administered 2011-10-06: 40 mg via SUBCUTANEOUS
  Filled 2011-10-06 (×3): qty 0.4

## 2011-10-06 MED ORDER — LEVOTHYROXINE SODIUM 100 MCG PO TABS
100.0000 ug | ORAL_TABLET | Freq: Every day | ORAL | Status: DC
  Administered 2011-10-06 – 2011-10-07 (×2): 100 ug via ORAL
  Filled 2011-10-06 (×2): qty 1

## 2011-10-06 MED ORDER — ACETAMINOPHEN 325 MG PO TABS
650.0000 mg | ORAL_TABLET | ORAL | Status: DC | PRN
Start: 2011-10-06 — End: 2011-10-07
  Administered 2011-10-06: 650 mg via ORAL
  Filled 2011-10-06: qty 2

## 2011-10-06 MED ORDER — CALCIUM CARBONATE 600 MG PO TABS: 600.0000 mg | ORAL_TABLET | Freq: Every day | ORAL | Status: DC

## 2011-10-06 MED ORDER — BUPROPION HCL 100 MG PO TABS
100.0000 mg | ORAL_TABLET | Freq: Every day | ORAL | Status: DC
  Administered 2011-10-06 – 2011-10-07 (×2): 100 mg via ORAL
  Filled 2011-10-06 (×2): qty 1

## 2011-10-06 MED ORDER — LISINOPRIL 5 MG PO TABS
5.0000 mg | ORAL_TABLET | Freq: Every day | ORAL | Status: DC
  Administered 2011-10-06 – 2011-10-07 (×2): 5 mg via ORAL
  Filled 2011-10-06 (×2): qty 1

## 2011-10-06 MED ORDER — LUTEIN 10 MG PO TABS: 1.0000 | ORAL_TABLET | Freq: Every day | ORAL | Status: DC

## 2011-10-06 MED ORDER — ONDANSETRON HCL 4 MG/2ML IJ SOLN: 4.0000 mg | Freq: Four times a day (QID) | INTRAMUSCULAR | Status: DC | PRN

## 2011-10-06 MED ORDER — TRAMADOL HCL 50 MG PO TABS
50.0000 mg | ORAL_TABLET | Freq: Four times a day (QID) | ORAL | Status: DC | PRN
  Administered 2011-10-06 – 2011-10-07 (×2): 50 mg via ORAL
  Filled 2011-10-06 (×2): qty 1

## 2011-10-06 MED ORDER — SODIUM CHLORIDE 0.9 % IJ SOLN
3.0000 mL | INTRAMUSCULAR | Status: DC | PRN
  Administered 2011-10-07: 3 mL via INTRAVENOUS

## 2011-10-06 NOTE — ED Notes (Signed)
Dr Garba at bedside 

## 2011-10-06 NOTE — ED Notes (Signed)
Attempted to call report.  RN Kathlene November in pt room.  I will call back in 10 minutes.

## 2011-10-06 NOTE — ED Notes (Signed)
PT states normally when she has numbness to her face she takes xanax at home and it works.

## 2011-10-06 NOTE — Progress Notes (Addendum)
Subjective: Patient informs her shortness of breath to be slightly better  Objective:  Vital signs in last 24 hours:  Filed Vitals:   10/05/11 2348 10/06/11 0115 10/06/11 0321 10/06/11 1448  BP:  139/74 149/82 150/81  Pulse:  94 94 90  Temp: 98 F (36.7 C)  98.2 F (36.8 C) 98.1 F (36.7 C)  TempSrc: Oral  Oral Oral  Resp:  19 18 18   Height:   5\' 6"  (1.676 m)   Weight:   74.9 kg (165 lb 2 oz)   SpO2:  97% 98% 95%    Intake/Output from previous day:   Intake/Output Summary (Last 24 hours) at 10/06/11 1510 Last data filed at 10/06/11 1300  Gross per 24 hour  Intake    120 ml  Output   1700 ml  Net  -1580 ml    Physical Exam:  General: elderly female  in no acute distress. HEENT: no pallor, no icterus, moist oral mucosa, no JVD, no lymphadenopathy Heart: Normal  s1 &s2  Regular rate and rhythm, without murmurs, rubs, gallops. Lungs: fine crackles over left lung base Abdomen: Soft, nontender, nondistended, positive bowel sounds. Extremities: No clubbing cyanosis or edema with positive pedal pulses. Neuro: Alert, awake, oriented x3, nonfocal.   Lab Results:  Basic Metabolic Panel:    Component Value Date/Time   NA 139 10/05/2011 1710   K 3.9 10/05/2011 1710   CL 104 10/05/2011 1710   CO2 25 10/05/2011 1710   BUN 8 10/05/2011 1710   CREATININE 0.70 10/06/2011 0700   GLUCOSE 152* 10/05/2011 1710   CALCIUM 9.2 10/05/2011 1710   CBC:    Component Value Date/Time   WBC 11.8* 10/06/2011 0700   HGB 11.2* 10/06/2011 0700   HCT 34.3* 10/06/2011 0700   PLT 381 10/06/2011 0700   MCV 101.8* 10/06/2011 0700   NEUTROABS 4.6 09/20/2011 0905   LYMPHSABS 1.6 09/20/2011 0905   MONOABS 1.2* 09/20/2011 0905   EOSABS 0.0 09/20/2011 0905   BASOSABS 0.0 09/20/2011 0905    No results found for this or any previous visit (from the past 240 hour(s)).  Studies/Results: Dg Chest 2 View  10/05/2011  *RADIOLOGY REPORT*  Clinical Data: Shortness of breath.  History of aortic  aneurysm. Recently hospitalized for hypertension.  Ex-smoker as of 6 weeks ago.  CHEST - 2 VIEW  Comparison: 09/19/2011  Findings: Degraded lateral view secondary patient positioning. Underlying hyperinflation.  Mild osteopenia.  Cardiomegaly with atherosclerosis in the transverse aorta.  A small left pleural effusion is decreased since 09/19/2011. No pneumothorax.  Biapical pleural thickening. No congestive failure.  Mild bibasilar volume loss.  IMPRESSION:  1.  Cardiomegaly and hyperinflation, without congestive failure. 2.  Small left pleural effusion.  Original Report Authenticated By: Consuello Bossier, M.D.   Nm Pulmonary Per & Vent  10/05/2011  *RADIOLOGY REPORT*  Clinical Data:  Shortness of breath, increased D-dimer, contrast allergy  NUCLEAR MEDICINE VENTILATION - PERFUSION LUNG SCAN  Technique:  Wash-in, equilibrium, and wash-out phase ventilation images were obtained using Xe-133 gas.  Perfusion images were obtained in multiple projections after intravenous injection of Tc- 67m MAA.  Radiopharmaceuticals:  10 mCi Xe-133 gas and 6.0 mCi Tc-20m MAA.  Comparison:  None Correlation:  Chest radiograph 10/05/2011  Findings: Ventilation exam in anterior and posterior projections demonstrates a slightly smaller left lung than right. Minimal nonsegmental diminished ventilation at left lung base. No significant xenon retention.  Perfusion lung scan in eight projections shows slightly smaller left lung than right.  Minimal nonsegmental diminished perfusion at left lung base. No segmental or subsegmental perfusion defects identified.  Chest radiograph is significant for cardiomegaly and small left pleural effusion.  IMPRESSION: Minimal nonsegmental diminished perfusion and ventilation at left lung base, likely related to small left pleural effusion. Findings represent a very low probability pulmonary embolism.  Original Report Authenticated By: Lollie Marrow, M.D.    Medications: Scheduled Meds:   . ALPRAZolam   0.25 mg Oral Once  . amitriptyline  10 mg Oral QHS  . antiseptic oral rinse  15 mL Mouth Rinse BID  . buPROPion  100 mg Oral Daily  . calcium carbonate  1 tablet Oral Daily  . cholecalciferol  1,000 Units Oral Daily  . enoxaparin  40 mg Subcutaneous Q24H  . furosemide  20 mg Intravenous BID  . furosemide  40 mg Intravenous Once  . levothyroxine  100 mcg Oral Daily  . lisinopril  5 mg Oral Daily  . nitroGLYCERIN  1 inch Topical Once  . sodium chloride  3 mL Intravenous Q12H  . DISCONTD: calcium carbonate  600 mg Oral Daily  . DISCONTD: Lutein  1 tablet Oral Daily   Continuous Infusions:   . sodium chloride     PRN Meds:.acetaminophen, ALPRAZolam, ondansetron (ZOFRAN) IV, sodium chloride, technetium albumin aggregated, traMADol, xenon xe 133  Assessment: 77 y/o female with DM type 2 , anxiety, COPD with emphysema with recent hospitalization for bradycardia with temporary pacing done , mild systolic dysfunction presents with acute onset SOB with findings of left sided effusion and elevated pro BNP suggestive of CHF exacerbation.  PLAN:  Acute systolic CHF exacerbation  recent echo showing EF of 40% continue low dose IV lasix for diuresis  cont strict I/O and daily wt Cont ACEi  avoiding BB given recent bradycardia  patient has appt with cardiology in 1 week VQ scan done on admission showing low probability for PE  HTN  cont home meds  Hypothyroidism  cont synthroid  COPD Stable , not on meds o2 via [place] prn Albuterol prn  Anxiety  cont xanax,   depression cont amitryptaline  DM:  stable  Falls:  nurse informed that patient has been having frequent falls at home. Will get PT eval  DVT prophylaxis   LOS: 1 day   Elba Dendinger 10/06/2011, 3:10 PM

## 2011-10-06 NOTE — H&P (Signed)
Paula [name] is an 77 y.o. female.   Chief Complaint: Shortness of Breath HPI: 77 yo that was just in the Hospital earlier this month with drug-induced bradycardia and EF of 40% on Echo who came in with progressive shortness of breath for 2 days. She had history of mild COPD and tried Albuterol MDI but no help. Mild Wheeze but no Chest pain, No NVD. She was seen in ED worked up for PE as well as cardiology evaluation. Patient seems to have mild pleural efusion and increathed BNP consistent with CHF exacerbation.  Past Medical History  Diagnosis Date  . Diabetes mellitus   . Thyroid disease   . Abdominal aneurysm   . COPD (chronic obstructive pulmonary disease)   . Hypertension   . Shortness of breath   . Hypothyroidism   . Depression   . Recurrent upper respiratory infection (URI)     Past Surgical History  Procedure Date  . Cholecystectomy   . Joint replacement   . Sinus surgery with instatrak   . Appendectomy   . Oophorectomy   . Arthroscopic repair acl     Family History  Problem Relation Age of Onset  . Cancer Father    Social History:  reports that she quit smoking about 5 weeks ago. She does not have any smokeless tobacco history on file. She reports that she does not drink alcohol or use illicit drugs.  Allergies:  Allergies  Allergen Reactions  . Sulfa Antibiotics Anaphylaxis  . Contrast Media (Iodinated Diagnostic Agents) Swelling  . Iohexol      Desc: PT STATES SHE HAD A SEVERE REACTION TO IV CONRAST WITH THROAT SWELLING AND SOB. SHE WAS ADMITTED TO THE HOSPITAL. SHE HAS NEVER HAD CONTRAST AGAIN.   Marland Kitchen Penicillins Rash    Medications Prior to Admission  Medication Dose Route Frequency Provider Last Rate Last Dose  . furosemide (LASIX) injection 40 mg  40 mg Intravenous Once Celene Kras, MD   40 mg at 10/05/11 2245  . nitroGLYCERIN (NITROGLYN) 2 % ointment 1 inch  1 inch Topical Once Celene Kras, MD   1 inch at 10/05/11 2242  . technetium albumin aggregated  (MAA) injection solution 6 milli Curie  6 milli Curie Intravenous Once PRN Medication Radiologist   6 milli Curie at 10/05/11 2030  . xenon xe 133 gas 10 milli Curie  10 milli Curie Inhalation Once PRN Medication Radiologist   10 milli Curie at 10/05/11 2020   Medications Prior to Admission  Medication Sig Dispense Refill  . ALPRAZolam (XANAX) 0.25 MG tablet Take 0.25 mg by mouth at bedtime as needed. TAKES 1 TAB IN THE MORNING IN 2 TABS IN THE EVENING      . amitriptyline (ELAVIL) 10 MG tablet Take 10 mg by mouth at bedtime.        Marland Kitchen buPROPion (WELLBUTRIN) 100 MG tablet Take 100 mg by mouth daily.       . calcium carbonate (OS-CAL) 600 MG TABS Take 600 mg by mouth daily.        . cholecalciferol (VITAMIN D) 1000 UNITS tablet Take 1,000 Units by mouth daily.        Marland Kitchen levothyroxine (SYNTHROID, LEVOTHROID) 100 MCG tablet Take 100 mcg by mouth daily.        . Lutein 10 MG TABS Take 1 tablet by mouth daily.          Results for orders placed during the hospital encounter of 10/05/11 (from the past 48  hour(s))  PRO B NATRIURETIC PEPTIDE     Status: Abnormal   Collection Time   10/05/11  5:10 PM      Component Value Range Comment   BNP, POC 1329.0 (*) 0 - 450 (pg/mL)   BASIC METABOLIC PANEL     Status: Abnormal   Collection Time   10/05/11  5:10 PM      Component Value Range Comment   Sodium 139  135 - 145 (mEq/L)    Potassium 3.9  3.5 - 5.1 (mEq/L)    Chloride 104  96 - 112 (mEq/L)    CO2 25  19 - 32 (mEq/L)    Glucose, Bld 152 (*) 70 - 99 (mg/dL)    BUN 8  6 - 23 (mg/dL)    Creatinine, Ser 1.61  0.50 - 1.10 (mg/dL)    Calcium 9.2  8.4 - 10.5 (mg/dL)    GFR calc non Af Amer 77 (*) >90 (mL/min)    GFR calc Af Amer 90 (*) >90 (mL/min)   CBC     Status: Abnormal   Collection Time   10/05/11  5:10 PM      Component Value Range Comment   WBC 10.2  4.0 - 10.5 (K/uL)    RBC 3.35 (*) 3.87 - 5.11 (MIL/uL)    Hemoglobin 11.3 (*) 12.0 - 15.0 (g/dL)    HCT 09.6 (*) 04.5 - 46.0 (%)    MCV  102.1 (*) 78.0 - 100.0 (fL)    MCH 33.7  26.0 - 34.0 (pg)    MCHC 33.0  30.0 - 36.0 (g/dL)    RDW 40.9  81.1 - 91.4 (%)    Platelets 374  150 - 400 (K/uL)   D-DIMER, QUANTITATIVE     Status: Abnormal   Collection Time   10/05/11  5:10 PM      Component Value Range Comment   D-Dimer, Quant 0.80 (*) 0.00 - 0.48 (ug/mL-FEU)   POCT I-STAT TROPONIN I     Status: Normal   Collection Time   10/05/11  5:20 PM      Component Value Range Comment   Troponin i, poc 0.02  0.00 - 0.08 (ng/mL)    Comment 3             Dg Chest 2 View  10/05/2011  *RADIOLOGY REPORT*  Clinical Data: Shortness of breath.  History of aortic aneurysm. Recently hospitalized for hypertension.  Ex-smoker as of 6 weeks ago.  CHEST - 2 VIEW  Comparison: 09/19/2011  Findings: Degraded lateral view secondary patient positioning. Underlying hyperinflation.  Mild osteopenia.  Cardiomegaly with atherosclerosis in the transverse aorta.  A small left pleural effusion is decreased since 09/19/2011. No pneumothorax.  Biapical pleural thickening. No congestive failure.  Mild bibasilar volume loss.  IMPRESSION:  1.  Cardiomegaly and hyperinflation, without congestive failure. 2.  Small left pleural effusion.  Original Report Authenticated By: Consuello Bossier, M.D.   Nm Pulmonary Per & Vent  10/05/2011  *RADIOLOGY REPORT*  Clinical Data:  Shortness of breath, increased D-dimer, contrast allergy  NUCLEAR MEDICINE VENTILATION - PERFUSION LUNG SCAN  Technique:  Wash-in, equilibrium, and wash-out phase ventilation images were obtained using Xe-133 gas.  Perfusion images were obtained in multiple projections after intravenous injection of Tc- 59m MAA.  Radiopharmaceuticals:  10 mCi Xe-133 gas and 6.0 mCi Tc-66m MAA.  Comparison:  None Correlation:  Chest radiograph 10/05/2011  Findings: Ventilation exam in anterior and posterior projections demonstrates a slightly smaller left lung than right.  Minimal nonsegmental diminished ventilation at left lung  base. No significant xenon retention.  Perfusion lung scan in eight projections shows slightly smaller left lung than right. Minimal nonsegmental diminished perfusion at left lung base. No segmental or subsegmental perfusion defects identified.  Chest radiograph is significant for cardiomegaly and small left pleural effusion.  IMPRESSION: Minimal nonsegmental diminished perfusion and ventilation at left lung base, likely related to small left pleural effusion. Findings represent a very low probability pulmonary embolism.  Original Report Authenticated By: Lollie Marrow, M.D.    Review of Systems  HENT: Negative.   Eyes: Negative.   Respiratory: Positive for shortness of breath and wheezing. Negative for cough, hemoptysis and sputum production.   Cardiovascular: Positive for orthopnea and PND. Negative for chest pain, palpitations, claudication and leg swelling.  Gastrointestinal: Negative.   Genitourinary: Negative.   Musculoskeletal: Negative.   Skin: Negative.   Neurological: Positive for tremors and weakness.       Left facial spasm  Endo/Heme/Allergies: Negative.   Psychiatric/Behavioral: Negative.     Blood pressure 138/73, pulse 94, temperature 98 F (36.7 C), temperature source Oral, resp. rate 25, SpO2 97.00%. Physical Exam  Constitutional: She is oriented to person, place, and time. She appears well-developed and well-nourished.  HENT:  Head: Normocephalic and atraumatic.  Right Ear: External ear normal.  Left Ear: External ear normal.  Eyes: Conjunctivae and EOM are normal. Pupils are equal, round, and reactive to light.  Neck: Normal range of motion. Neck supple.  Cardiovascular: Normal rate, regular rhythm, normal heart sounds, intact distal pulses and normal pulses.   Respiratory: Breath sounds normal. No respiratory distress. She has no wheezes. She has no rhonchi. She has no rales.  GI: Soft. Bowel sounds are normal.  Musculoskeletal: Normal range of motion.    Neurological: She is alert and oriented to person, place, and time. She has normal reflexes.  Skin: Skin is warm and dry.  Psychiatric: She has a normal mood and affect.     Assessment/Plan 1. CHF: Admit to tele, gentle Diuresis, ACEI, avoid BB for now due to recent bradycardia, Continue with cardiology input. 2. HTN: Continue home medications 3. Hypothyroidsm: Continue home medications 4. DM: Diet control. Continue to monitor   Latisa Belay,LAWAL 10/06/2011, 1:28 AM

## 2011-10-06 NOTE — ED Notes (Signed)
Pt c/o numbness to R face.  Dr Norlene Campbell consulted pt and feels the pt is not having a stroke.

## 2011-10-06 NOTE — Progress Notes (Signed)
CARE MANAGEMENT NOTE 10/06/2011  Patient:  Paula [name], Paula [name]   Account Number:  [id number]  Date Initiated:  10/06/2011  Documentation initiated by:  [facility]  Subjective/Objective Assessment:   CHF, Pacemaker 11/12, recent hospital stay for pnemonia     Action/Plan:   Pt was d/c [facility] for [facility] PT/OT. Lives at home with husband. He is very supportive.   Anticipated DC Date:     Anticipated DC Plan:  HOME W HOME HEALTH SERVICES      DC Planning Services  CM consult      Choice offered to / List presented to:             Status of service:   Medicare Important Message given?   (If response is "NO", the following Medicare IM given date fields will be blank) Date Medicare IM given:   Date Additional Medicare IM given:    Discharge Disposition:    Per UR Regulation:    Comments:  Pt interviewed. States she is taking medications and weighing daily. Has several appt for macular degeneration in both eyes next week. States she is getting injections and ocular pressure is checked. Has difficulty reading and uses magnifying glass. She has the Living with Heart Failure packet and plans to review. She is ok with having [facility] at time of d/c. Will discuss with MD about [facility] RN at time of d/c for meds education and teaching.

## 2011-10-07 ENCOUNTER — Other Ambulatory Visit: Payer: Self-pay

## 2011-10-07 ENCOUNTER — Emergency Department (HOSPITAL_COMMUNITY)
Admission: EM | Admit: 2011-10-07 | Discharge: 2011-10-08 | Disposition: A | Discharge status: Home / Self Care | Payer: Medicare Other | Attending: Emergency Medicine | Admitting: Emergency Medicine

## 2011-10-07 DIAGNOSIS — Z79899 Other long term (current) drug therapy: Secondary | ICD-10-CM | POA: Insufficient documentation

## 2011-10-07 DIAGNOSIS — I509 Heart failure, unspecified: Secondary | ICD-10-CM | POA: Insufficient documentation

## 2011-10-07 DIAGNOSIS — E039 Hypothyroidism, unspecified: Secondary | ICD-10-CM | POA: Insufficient documentation

## 2011-10-07 DIAGNOSIS — R109 Unspecified abdominal pain: Secondary | ICD-10-CM | POA: Insufficient documentation

## 2011-10-07 DIAGNOSIS — J449 Chronic obstructive pulmonary disease, unspecified: Secondary | ICD-10-CM | POA: Insufficient documentation

## 2011-10-07 DIAGNOSIS — R197 Diarrhea, unspecified: Secondary | ICD-10-CM | POA: Insufficient documentation

## 2011-10-07 DIAGNOSIS — G40909 Epilepsy, unspecified, not intractable, without status epilepticus: Secondary | ICD-10-CM | POA: Insufficient documentation

## 2011-10-07 DIAGNOSIS — I10 Essential (primary) hypertension: Secondary | ICD-10-CM | POA: Insufficient documentation

## 2011-10-07 DIAGNOSIS — F3289 Other specified depressive episodes: Secondary | ICD-10-CM | POA: Insufficient documentation

## 2011-10-07 DIAGNOSIS — F329 Major depressive disorder, single episode, unspecified: Secondary | ICD-10-CM | POA: Insufficient documentation

## 2011-10-07 DIAGNOSIS — R079 Chest pain, unspecified: Secondary | ICD-10-CM | POA: Insufficient documentation

## 2011-10-07 DIAGNOSIS — R0602 Shortness of breath: Secondary | ICD-10-CM | POA: Insufficient documentation

## 2011-10-07 DIAGNOSIS — J4489 Other specified chronic obstructive pulmonary disease: Secondary | ICD-10-CM | POA: Insufficient documentation

## 2011-10-07 DIAGNOSIS — E119 Type 2 diabetes mellitus without complications: Secondary | ICD-10-CM | POA: Insufficient documentation

## 2011-10-07 DIAGNOSIS — R112 Nausea with vomiting, unspecified: Secondary | ICD-10-CM | POA: Insufficient documentation

## 2011-10-07 DIAGNOSIS — N390 Urinary tract infection, site not specified: Secondary | ICD-10-CM | POA: Insufficient documentation

## 2011-10-07 LAB — CARDIAC PANEL(CRET KIN+CKTOT+MB+TROPI)
CK, MB: 2.7 ng/mL (ref 0.3–4.0)
Relative Index: INVALID (ref 0.0–2.5)
Total CK: 30 U/L (ref 7–177)
Troponin I: <0.3 ng/mL (ref <0.30)

## 2011-10-07 LAB — GLUCOSE, CAPILLARY

## 2011-10-07 LAB — BASIC METABOLIC PANEL
CO2: 31 mEq/L (ref 19–32)
Calcium: 9.7 mg/dL (ref 8.4–10.5)
Chloride: 102 mEq/L (ref 96–112)
Creatinine, Ser: 0.78 mg/dL (ref 0.50–1.10)
Glucose, Bld: 120 mg/dL — ABNORMAL HIGH (ref 70–99)
Sodium: 139 mEq/L (ref 135–145)

## 2011-10-07 MED ORDER — POTASSIUM CHLORIDE CRYS ER 20 MEQ PO TBCR
40.0000 meq | EXTENDED_RELEASE_TABLET | Freq: Once | ORAL | Status: AC
  Administered 2011-10-07: 40 meq via ORAL
  Filled 2011-10-07: qty 2

## 2011-10-07 MED ORDER — FUROSEMIDE 20 MG PO TABS: 20.0000 mg | ORAL_TABLET | Freq: Every day | ORAL | Status: DC

## 2011-10-07 MED ORDER — LISINOPRIL 5 MG PO TABS: 5.0000 mg | ORAL_TABLET | Freq: Every day | ORAL | Status: DC

## 2011-10-07 MED ORDER — ONDANSETRON HCL 4 MG/2ML IJ SOLN
4.0000 mg | Freq: Once | INTRAMUSCULAR | Status: AC
  Administered 2011-10-08: 4 mg via INTRAVENOUS
  Filled 2011-10-07: qty 2

## 2011-10-07 MED ORDER — POTASSIUM CHLORIDE 20 MEQ PO PACK: 40.0000 meq | PACK | Freq: Once | ORAL | Status: DC

## 2011-10-07 MED ORDER — SODIUM CHLORIDE 0.9 % IV BOLUS (SEPSIS): 250.0000 mL | Freq: Once | INTRAVENOUS | Status: DC

## 2011-10-07 MED ORDER — ONDANSETRON HCL 4 MG/2ML IJ SOLN
INTRAMUSCULAR | Status: AC
  Filled 2011-10-07: qty 2

## 2011-10-07 NOTE — Progress Notes (Signed)
Physical Therapy Evaluation Patient Details Name: Paula [name] MRN: [id number] DOB: 06-18-1927 Today's Date: 10/07/2011  Problem List:  Patient Active Problem List  Diagnoses  . COPD (chronic obstructive pulmonary disease) with emphysema  . Diabetes mellitus type II, controlled  . Aneurysm of abdominal aorta  . Pneumonia, organism unspecified  . UTI (urinary tract infection)  . Episode of dizziness  . Parotid mass  . Hypothyroidism  . Complete heart block  . Sinus bradycardia  . Hypokalemia  . Delirium  . Suspected CHF (congestive heart failure)  . Anxiety state, unspecified    Past Medical History:  Past Medical History  Diagnosis Date  . Diabetes mellitus   . Thyroid disease   . Abdominal aneurysm   . COPD (chronic obstructive pulmonary disease)   . Hypertension   . Shortness of breath   . Hypothyroidism   . Depression   . Recurrent upper respiratory infection (URI)   . Seizures    Past Surgical History:  Past Surgical History  Procedure Date  . Cholecystectomy   . Joint replacement   . Sinus surgery with instatrak   . Appendectomy   . Oophorectomy   . Arthroscopic repair acl     PT Assessment/Plan/Recommendation PT Assessment Clinical Impression Statement: Pt presents with decreased mobility and cardiopulmonary status limiting activity.  Pt will benefit from PT while in the acute setting to improve overall functional mobility, balance, and maximize independence to prepare for safe d/c home. PT Recommendation/Assessment: Patient will need skilled PT in the acute care venue PT Problem List: Decreased activity tolerance;Decreased balance;Decreased mobility;Decreased knowledge of use of DME;Decreased safety awareness;Cardiopulmonary status limiting activity Barriers to Discharge: None PT Therapy Diagnosis : Difficulty walking;Abnormality of gait;Generalized weakness PT Plan PT Frequency: Min 3X/week PT Treatment/Interventions: DME instruction;Gait  training;Stair training;Functional mobility training;Therapeutic exercise;Balance training;Patient/family education PT Recommendation Follow Up Recommendations: Home health PT Equipment Recommended: Defer to next venue PT Goals  Acute Rehab PT Goals PT Goal Formulation: With patient/family Time For Goal Achievement: 7 days Pt will go Sit to Supine/Side: with modified independence PT Goal: Sit to Supine/Side - Progress: Progressing toward goal Pt will Transfer Bed to Chair/Chair to Bed: with modified independence PT Transfer Goal: Bed to Chair/Chair to Bed - Progress: Progressing toward goal Pt will Stand: with modified independence PT Goal: Stand - Progress: Progressing toward goal Pt will Ambulate: >150 feet;with modified independence;with least restrictive assistive device PT Goal: Ambulate - Progress: Progressing toward goal Pt will Go Up / Down Stairs: 1-2 stairs;with supervision;with rail(s) PT Goal: Up/Down Stairs - Progress: Not met  PT Evaluation Precautions/Restrictions  Precautions Precautions: Fall Restrictions Weight Bearing Restrictions: No Prior Functioning  Home Living Lives With: Spouse Receives Help From: Family Type of Home: House Home Layout: One level Home Access: Stairs to enter Entrance Stairs-Rails: Right Entrance Stairs-Number of Steps: 1 Bathroom Shower/Tub: Engineer, manufacturing systems: Handicapped height Bathroom Accessibility: No Home Adaptive Equipment: Straight cane;Walker - rolling Additional Comments: AD's at home, but does not always use Prior Function Level of Independence: Independent with basic ADLs Able to Take Stairs?: Yes Driving: No Vocation: Retired Financial risk analyst Arousal/Alertness: Awake/alert Overall Cognitive Status: Appears within functional limits for tasks assessed Attention: Appears overall intact for tasks assessed Orientation Level: Oriented X4 Decreased Safety/Judgement: Decreased awareness of need for  assistance Sensation/Coordination   Extremity Assessment RUE Assessment RUE Assessment: Within Functional Limits LUE Assessment LUE Assessment: Within Functional Limits RLE Assessment RLE Assessment: Within Functional Limits LLE Assessment LLE Assessment: Within Functional Limits Mobility (  including Balance) Bed Mobility Bed Mobility: Yes Supine to Sit: 4: Min assist;HOB elevated (Comment degrees);With rails Supine to Sit Details (indicate cue type and reason): Cues for hand placement and min A to complete movement. Sitting - Scoot to Edge of Bed: 5: Supervision Sitting - Scoot to Edge of Bed Details (indicate cue type and reason): Supervision for pt safety.  Verbal cues to initiate. Transfers Transfers: Yes Sit to Stand: 5: Supervision;With upper extremity assist;With armrests;From chair/3-in-1;From bed Sit to Stand Details (indicate cue type and reason): Supervision for pt safety.  Verbal cues for hand placement on bed / armrests vs on RW. Stand to Sit: 5: Supervision;With armrests;To chair/3-in-1 Stand to Sit Details: Supervision for pt safety. Ambulation/Gait Ambulation/Gait: Yes Ambulation/Gait Assistance: 4: Min assist Ambulation/Gait Assistance Details (indicate cue type and reason): Min A to maintain balance for pt safety. Ambulation Distance (Feet): 180 Feet Assistive device: Rolling walker Gait Pattern: Shuffle Gait velocity: Decreased, but not measured Stairs: No Wheelchair Mobility Wheelchair Mobility: No  Posture/Postural Control Posture/Postural Control: No significant limitations Balance Balance Assessed: Yes Static Standing Balance Static Standing - Balance Support: During functional activity (While putting on gait belt and measured SpO2) Static Standing - Level of Assistance: 5: Stand by assistance (For pt safety) Static Standing - Comment/# of Minutes: 2  Vitals: O2 Sats - During ambulation with 2 L O2, SpO2 ranged from 96-97%.  During ambulation at RA,  SpO2 ranged from 93-94%.    Exercise    End of Session PT - End of Session Equipment Utilized During Treatment: Gait belt;Other (comment) (2 L O2) Activity Tolerance: Patient tolerated treatment well Patient left: in chair;with call bell in reach;with family/visitor present Nurse Communication: Mobility status for transfers;Mobility status for ambulation;Other (comment) (SpO2 status) General Behavior During Session: [facility] for tasks performed Cognition: [facility] for tasks performed Cognitive Impairment: Pt demonstrated no agitation today.  Pt seemed to be oriented to tasks and did not require repeated verbal cues to redirect.  Lacinda Axon 10/07/2011, 10:30 AM

## 2011-10-07 NOTE — Discharge Summary (Signed)
Patient ID: Paula [name] MRN: [id number] DOB/AGE: [birth year] 77 y.o.  Admit date: 10/05/2011 Discharge date: 10/07/2011  Primary Care Physician:  Georgianne Fick, MD, MD  Principle Discharge Diagnoses:    1. Congestive heart failure ( systolic)   Secondary discharge diagnosis 2.COPD (chronic obstructive pulmonary disease) with emphysema 3.Diabetes mellitus type II, controlled 4.Anxiety state, unspecified 5. Hypothyroidism  6. AAA ( follows with vascular as outpt)    Current Discharge Medication List    START taking these medications   Details  furosemide (LASIX) 20 MG tablet Take 1 tablet (20 mg total) by mouth daily. Qty: 30 tablet, Refills: 0    lisinopril (PRINIVIL,ZESTRIL) 5 MG tablet Take 1 tablet (5 mg total) by mouth daily. Qty: 30 tablet, Refills: 0      CONTINUE these medications which have NOT CHANGED   Details  ALPRAZolam (XANAX) 0.25 MG tablet Take 0.25 mg by mouth at bedtime as needed. TAKES 1 TAB IN THE MORNING IN 2 TABS IN THE EVENING    amitriptyline (ELAVIL) 10 MG tablet Take 10 mg by mouth at bedtime.      buPROPion (WELLBUTRIN) 100 MG tablet Take 100 mg by mouth daily.     calcium carbonate (OS-CAL) 600 MG TABS Take 600 mg by mouth daily.      cholecalciferol (VITAMIN D) 1000 UNITS tablet Take 1,000 Units by mouth daily.      levothyroxine (SYNTHROID, LEVOTHROID) 100 MCG tablet Take 100 mcg by mouth daily.      Lutein 10 MG TABS Take 1 tablet by mouth daily.          Disposition and Follow-up:  Follow up with PCP in 1 week Patient has follow up with Dr Klein/ Dr Johney Frame  in 1 week  Consults:  none  Significant Diagnostic Studies:  Dg Chest 2 View  10/05/2011  *RADIOLOGY REPORT*  Clinical Data: Shortness of breath.  History of aortic aneurysm. Recently hospitalized for hypertension.  Ex-smoker as of 6 weeks ago.  CHEST - 2 VIEW  Comparison: 09/19/2011  Findings: Degraded lateral view secondary patient positioning. Underlying  hyperinflation.  Mild osteopenia.  Cardiomegaly with atherosclerosis in the transverse aorta.  A small left pleural effusion is decreased since 09/19/2011. No pneumothorax.  Biapical pleural thickening. No congestive failure.  Mild bibasilar volume loss.  IMPRESSION:  1.  Cardiomegaly and hyperinflation, without congestive failure. 2.  Small left pleural effusion.  Original Report Authenticated By: Consuello Bossier, M.D.   [facility] Per & Vent  10/05/2011  *RADIOLOGY REPORT*  Clinical Data:  Shortness of breath, increased D-dimer, contrast allergy  NUCLEAR MEDICINE VENTILATION - PERFUSION LUNG SCAN  Technique:  Wash-in, equilibrium, and wash-out phase ventilation images were obtained using Xe-133 gas.  Perfusion images were obtained in multiple projections after intravenous injection of Tc- 24m MAA.  Radiopharmaceuticals:  10 mCi Xe-133 gas and 6.0 mCi Tc-32m MAA.  Comparison:  None Correlation:  Chest radiograph 10/05/2011  Findings: Ventilation exam in anterior and posterior projections demonstrates a slightly smaller left lung than right. Minimal nonsegmental diminished ventilation at left lung base. No significant xenon retention.  Perfusion lung scan in eight projections shows slightly smaller left lung than right. Minimal nonsegmental diminished perfusion at left lung base. No segmental or subsegmental perfusion defects identified.  Chest radiograph is significant for cardiomegaly and small left pleural effusion.  IMPRESSION: Minimal nonsegmental diminished perfusion and ventilation at left lung base, likely related to small left pleural effusion. Findings represent a very low probability pulmonary embolism.  Original Report Authenticated By: Lollie Marrow, M.D.    Brief H and P: For complete details please refer to admission H and P, but in brief 77 yo that was just in the Hospital earlier this month with drug-induced bradycardia and EF of 40% on Echo who came in with progressive shortness of breath  for 2 days. She had history of mild COPD and tried Albuterol MDI but no help. Mild Wheeze but no Chest pain, No NVD. She was seen in ED worked up for PE as well as cardiology evaluation. Patient seems to have mild pleural efusion and increathed BNP consistent with CHF exacerbation.   Physical Exam on Discharge:  Filed Vitals:   10/07/11 0930 10/07/11 0940 10/07/11 1000 10/07/11 1055  BP:    116/73  Pulse:      Temp:      TempSrc:      Resp:      Height:      Weight:      SpO2: 97% 97% 93%      Intake/Output Summary (Last 24 hours) at 10/07/11 1259 Last data filed at 10/07/11 0500  Gross per 24 hour  Intake    122 ml  Output    900 ml  Net   -778 ml    General: elderly female in no acute distress.  HEENT: no pallor, no icterus, moist oral mucosa, no JVD, no lymphadenopathy  Heart: Normal s1 &s2 Regular rate and rhythm, without murmurs, rubs, gallops.  Lungs: fine crackles over left lung base  Abdomen: Soft, nontender, nondistended, positive bowel sounds.  Extremities: No clubbing cyanosis or edema with positive pedal pulses.  Neuro: Alert, awake, oriented x3, nonfocal.  CBC:    Component Value Date/Time   WBC 11.8* 10/06/2011 0700   HGB 11.2* 10/06/2011 0700   HCT 34.3* 10/06/2011 0700   PLT 381 10/06/2011 0700   MCV 101.8* 10/06/2011 0700   NEUTROABS 4.6 09/20/2011 0905   LYMPHSABS 1.6 09/20/2011 0905   MONOABS 1.2* 09/20/2011 0905   EOSABS 0.0 09/20/2011 0905   BASOSABS 0.0 09/20/2011 0905    Basic Metabolic Panel:    Component Value Date/Time   NA 139 10/07/2011 0625   K 3.2* 10/07/2011 0625   CL 102 10/07/2011 0625   CO2 31 10/07/2011 0625   BUN 12 10/07/2011 0625   CREATININE 0.78 10/07/2011 0625   GLUCOSE 120* 10/07/2011 0625   CALCIUM 9.7 10/07/2011 0625    Hospital Course:   Acute systolic CHF exacerbation  Patient noted to be short of breath with low o2 sat and elevate pro BNP recent echo showing EF of 40%  Started on  low dose IV lasix for  diuresis with improvement in symptoms Continued  ACEi  avoided BB given recent bradycardia . Patient stable on tele patient has appt with cardiology ( Dr Klein/ Allred) in 1 week  VQ scan done on admission showing low probability for PE  She will be discharged on low dose lasix   Hypokalemia  replenished  COPD  Stable , not on meds  Given 2 Lo2 via Gem  Albuterol prn given   Falls:  nurse informed that patient has been having frequent falls at home. PT eval done and cleared for discharge home with HHPT. Patient evalauted for o2 sat and has normal  sats on RA both at rest and ambualtion    Patient clinically stable and can be discharge home with otupt follow up  Time spent on Discharge: 40 minutes  Signed:  Alonia Dibuono 10/07/2011, 12:59 PM

## 2011-10-07 NOTE — Progress Notes (Signed)
   CARE MANAGEMENT NOTE 10/07/2011  Patient:  Paula [name], Paula [name]   Account Number:  [id number]  Date Initiated:  10/06/2011  Documentation initiated by:  [facility]  Subjective/Objective Assessment:   CHF, Pacemaker 11/12, recent hospital stay for pnemonia     Action/Plan:   Pt was d/c [facility] for [facility] PT/OT. Lives at home with husband. He is very supportive.   Anticipated DC Date:  10/08/2011   Anticipated DC Plan:  HOME W HOME HEALTH SERVICES      DC Planning Services  CM consult      [facility] Choice  Resumption Of Svcs/PTA Provider   Choice offered to / List presented to:  C-1 Patient        HH arranged  HH-1 RN  HH-10 DISEASE MANAGEMENT  HH-2 PT      HH agency  [facility]   Status of service:   Medicare Important Message given?   (If response is "NO", the following Medicare IM given date fields will be blank) Date Medicare IM given:   Date Additional Medicare IM given:    Discharge Disposition:  HOME W HOME HEALTH SERVICES  Per UR Regulation:  Reviewed for med. necessity/level of care/duration of stay  Comments:  11/26 spoke w pt and husband, act w [facility] on adm, have alerted debbie taylor w [facility] of adm and poss dc today, will get hhrn to do chf teaching, will resume hhc at [employer] rn,bsn [phone] 10/06/2011 1545 Pt interviewed. States she is taking medications and weighing daily. Has several appt for macular degeneration in both eyes next week. States she is getting injections and ocular pressure is checked. Has difficulty reading and uses magnifying glass. She has the Living with Heart Failure packet and plans to review. She is ok with having [facility] at time of d/c. Will discuss with MD about [facility] RN at time of d/c for meds education and teaching. Isidoro Donning RN CCM Case Mgmt phone [phone]

## 2011-10-07 NOTE — Progress Notes (Signed)
PT NOTE:  Pt's SaO2 maintain at 97% at rest with 2L O2.  During ambulation with 2 L O2, SaO2 ranged from 96-97%. During ambulation at RA, SaO2 ranged from 93-94%.  Pt remained on RA and measured 94% at rest.  Jake Shark, PT DPT [phone]

## 2011-10-07 NOTE — ED Provider Notes (Signed)
10:50 PM  Date: 10/07/2011  Rate: 224  Rhythm: sinus tachycardia  QRS Axis: normal  Intervals: PR prolonged  ST/T Wave abnormalities: Inverted T waves in inferior and precordial leads, suggesting ischemia.  Conduction Disutrbances:none  Narrative Interpretation: Abnormal EKG  Old EKG Reviewed: No change since tracining of 10/04/2101.    Carleene Cooper III, MD 10/07/11 2252

## 2011-10-07 NOTE — ED Provider Notes (Signed)
History     CSN: [id number] Arrival date & time: 10/07/2011 10:33 PM   First MD Initiated Contact with Patient 10/07/11 2315      Chief Complaint  Patient presents with  . Shortness of Breath    just discharged today from hospital. pt dx with CHF. pt also had vomiting for ems.    (Consider location/radiation/quality/duration/timing/severity/associated sxs/prior treatment) HPI History provided by pt and prior chart.  Pt a poor historian.  Per prior chart, pt admitted for CHF exacerbation 11/24-11/26/12.  SOB improved at time of discharge.   Tonight she developed diarrhea (3 episodes) as well as one episode vomiting.  Continues to feel nauseous.  No known fever.  Initially denies chest and abd pain but then says that she has had brief, sharp pains that shoot up from left abd to left chest.  She does not know how many episodes nor when the most recent pain occurred.  No urinary sx.   Past Medical History  Diagnosis Date  . Diabetes mellitus   . Thyroid disease   . Abdominal aneurysm   . COPD (chronic obstructive pulmonary disease)   . Hypertension   . Shortness of breath   . Hypothyroidism   . Depression   . Recurrent upper respiratory infection (URI)   . Seizures     Past Surgical History  Procedure Date  . Cholecystectomy   . Joint replacement   . Sinus surgery with instatrak   . Appendectomy   . Oophorectomy   . Arthroscopic repair acl     Family History  Problem Relation Age of Onset  . Cancer Father     History  Substance Use Topics  . Smoking status: Former Smoker    Quit date: 08/31/2011  . Smokeless tobacco: Not on file  . Alcohol Use: No    OB History    Grav Para Term Preterm Abortions TAB SAB Ect Mult Living                  Review of Systems  All other systems reviewed and are negative.    Allergies  Sulfa antibiotics; Contrast media; Iohexol; and Penicillins  Home Medications   Current Outpatient Rx  Name Route Sig Dispense Refill  .  ALPRAZOLAM 0.25 MG PO TABS Oral Take 0.25 mg by mouth at bedtime as needed. TAKES 1 TAB IN THE MORNING IN 2 TABS IN THE EVENING    . AMITRIPTYLINE HCL 10 MG PO TABS Oral Take 10 mg by mouth at bedtime.      . BUPROPION HCL 100 MG PO TABS Oral Take 100 mg by mouth daily.     Marland Kitchen CALCIUM CARBONATE 600 MG PO TABS Oral Take 600 mg by mouth daily.      Marland Kitchen VITAMIN D 1000 UNITS PO TABS Oral Take 1,000 Units by mouth daily.      . FUROSEMIDE 20 MG PO TABS Oral Take 1 tablet (20 mg total) by mouth daily. 30 tablet 0  . LEVOTHYROXINE SODIUM 100 MCG PO TABS Oral Take 100 mcg by mouth daily.      Marland Kitchen LISINOPRIL 5 MG PO TABS Oral Take 1 tablet (5 mg total) by mouth daily. 30 tablet 0  . LUTEIN 10 MG PO TABS Oral Take 1 tablet by mouth daily.        BP 139/67  Pulse 106  Temp(Src) 99.2 F (37.3 C) (Oral)  Resp 24  SpO2 97%  Physical Exam  Nursing note and vitals reviewed. Constitutional: She  is oriented to person, place, and time. She appears well-developed and well-nourished. No distress.  HENT:  Head: Normocephalic and atraumatic.       Mucous membranes dry  Eyes:       Normal appearance  Neck: Normal range of motion.  Cardiovascular: Normal rate and regular rhythm.   Pulmonary/Chest: Effort normal and breath sounds normal.  Abdominal: Soft. Bowel sounds are normal. She exhibits no distension and no mass. There is no rebound and no guarding.       Mild epigastric and RLQ ttp.  Palpation of LUQ causes pain in her throat.    Neurological: She is alert and oriented to person, place, and time.  Skin: Skin is warm and dry. No rash noted.  Psychiatric: She has a normal mood and affect. Her behavior is normal.    ED Course  Procedures (including critical care time)  Labs Reviewed  CBC - Abnormal; Notable for the following:    WBC 17.6 (*)    RBC 3.59 (*)    MCV 102.8 (*)    All other components within normal limits  COMPREHENSIVE METABOLIC PANEL - Abnormal; Notable for the following:     Glucose, Bld 148 (*)    GFR calc non Af Amer 76 (*)    GFR calc Af Amer 88 (*)    All other components within normal limits  LIPASE, BLOOD  TROPONIN I  URINALYSIS, ROUTINE W REFLEX MICROSCOPIC   No results found.   1. Nausea vomiting and diarrhea       MDM  Pt discharged from hospital today after 2 day admission for CHF exacerbation.  C/o N/V/D that started tonight after going to bed.  Has had vague, shooting pains from left abd to chest as well.  On exam, pt in NAD, mildly tachycardic, dry mucous membranes, mild RLQ/epigastric tenderness and palpation of LUQ causes pain in throat.  EKG unchanged, troponin neg, WBC count 17.6 and labs otherwise unremarkable.  Pt receiving IV fluids and zofran.  Dr. Juleen China has evaluated and she had mild LLQ tenderness on his exam.  U/A pending.  If nml, will c/o home w/ zofran and abd pain return precautions.          Otilio Miu, PA 10/08/11 0130

## 2011-10-08 ENCOUNTER — Emergency Department (HOSPITAL_COMMUNITY)
Admission: EM | Admit: 2011-10-08 | Discharge: 2011-10-08 | Discharge status: Against Medical Advice | Payer: Medicare Other | Attending: Emergency Medicine | Admitting: Emergency Medicine

## 2011-10-08 DIAGNOSIS — N390 Urinary tract infection, site not specified: Secondary | ICD-10-CM | POA: Insufficient documentation

## 2011-10-08 DIAGNOSIS — Z5320 Procedure and treatment not carried out because of patient's decision for unspecified reasons: Secondary | ICD-10-CM | POA: Insufficient documentation

## 2011-10-08 LAB — CBC
MCHC: 32.5 g/dL (ref 30.0–36.0)
Platelets: 359 10*3/uL (ref 150–400)
RDW: 14.2 % (ref 11.5–15.5)
WBC: 17.6 10*3/uL — ABNORMAL HIGH (ref 4.0–10.5)

## 2011-10-08 LAB — URINE MICROSCOPIC-ADD ON

## 2011-10-08 LAB — COMPREHENSIVE METABOLIC PANEL
ALT: 15 U/L (ref 0–35)
AST: 16 U/L (ref 0–37)
Albumin: 3.7 g/dL (ref 3.5–5.2)
Alkaline Phosphatase: 71 U/L (ref 39–117)
BUN: 20 mg/dL (ref 6–23)
Chloride: 102 mEq/L (ref 96–112)
Potassium: 3.5 mEq/L (ref 3.5–5.1)
Sodium: 140 mEq/L (ref 135–145)
Total Protein: 6.6 g/dL (ref 6.0–8.3)

## 2011-10-08 LAB — URINALYSIS, ROUTINE W REFLEX MICROSCOPIC
Specific Gravity, Urine: 1.034 — ABNORMAL HIGH (ref 1.005–1.030)
Urobilinogen, UA: 1 mg/dL (ref 0.0–1.0)
pH: 5 (ref 5.0–8.0)

## 2011-10-08 LAB — LIPASE, BLOOD: Lipase: 37 U/L (ref 11–59)

## 2011-10-08 MED ORDER — ONDANSETRON HCL 4 MG/2ML IJ SOLN: 4.0000 mg | Freq: Three times a day (TID) | INTRAMUSCULAR | Status: DC | PRN

## 2011-10-08 MED ORDER — SODIUM CHLORIDE 0.9 % IV SOLN: INTRAVENOUS | Status: DC

## 2011-10-08 MED ORDER — DEXTROSE 5 % IV SOLN
1.0000 g | Freq: Once | INTRAVENOUS | Status: AC
  Administered 2011-10-08: 1 g via INTRAVENOUS
  Filled 2011-10-08: qty 10

## 2011-10-08 MED ORDER — NITROFURANTOIN MONOHYD MACRO 100 MG PO CAPS: 100.0000 mg | ORAL_CAPSULE | Freq: Two times a day (BID) | ORAL | Status: AC

## 2011-10-08 MED ORDER — ONDANSETRON 8 MG PO TBDP: 8.0000 mg | ORAL_TABLET | Freq: Three times a day (TID) | ORAL | Status: AC | PRN

## 2011-10-08 NOTE — ED Notes (Signed)
Patient given discharge instructions, states she just doesn;t feel good, wanting to talk to MD before discharge/.

## 2011-10-08 NOTE — ED Provider Notes (Signed)
Medical screening examination/treatment/procedure(s) were conducted as a shared visit with non-physician practitioner(s) and myself.  I personally evaluated the patient during the encounter.  Pt with n/v/d of less than 24 hours duration. Denies abdominal pain. Questionable mild LLQ tenderness on my exam. Clinically well appearing. Low clinical suspicion for acute abdominal surgical process. UA consistent with UTI. Course Abx. Mild leukocytosis which suspect stress demargination. Do not suspect urosepsis at this time.  Raeford Razor, MD 10/08/11 0900

## 2011-10-08 NOTE — ED Notes (Signed)
Patient resting on stretcher , states she still doesn't feel good. Husband at bedside.

## 2011-10-12 ENCOUNTER — Inpatient Hospital Stay (HOSPITAL_COMMUNITY): Payer: Medicare Other

## 2011-10-12 ENCOUNTER — Encounter (HOSPITAL_COMMUNITY): Payer: Self-pay | Admitting: Emergency Medicine

## 2011-10-12 ENCOUNTER — Observation Stay (HOSPITAL_COMMUNITY)
Admission: EM | Admit: 2011-10-12 | Discharge: 2011-10-14 | Disposition: A | Discharge status: Home / Self Care | Payer: Medicare Other | Attending: Internal Medicine | Admitting: Internal Medicine

## 2011-10-12 ENCOUNTER — Other Ambulatory Visit: Payer: Self-pay

## 2011-10-12 ENCOUNTER — Emergency Department (HOSPITAL_COMMUNITY): Payer: Medicare Other

## 2011-10-12 DIAGNOSIS — R220 Localized swelling, mass and lump, head: Secondary | ICD-10-CM | POA: Insufficient documentation

## 2011-10-12 DIAGNOSIS — G459 Transient cerebral ischemic attack, unspecified: Secondary | ICD-10-CM

## 2011-10-12 DIAGNOSIS — I714 Abdominal aortic aneurysm, without rupture, unspecified: Secondary | ICD-10-CM | POA: Insufficient documentation

## 2011-10-12 DIAGNOSIS — J438 Other emphysema: Secondary | ICD-10-CM | POA: Insufficient documentation

## 2011-10-12 DIAGNOSIS — R221 Localized swelling, mass and lump, neck: Secondary | ICD-10-CM | POA: Insufficient documentation

## 2011-10-12 DIAGNOSIS — G5139 Clonic hemifacial spasm, unspecified: Secondary | ICD-10-CM

## 2011-10-12 DIAGNOSIS — I639 Cerebral infarction, unspecified: Secondary | ICD-10-CM

## 2011-10-12 DIAGNOSIS — I442 Atrioventricular block, complete: Secondary | ICD-10-CM | POA: Insufficient documentation

## 2011-10-12 DIAGNOSIS — R9430 Abnormal result of cardiovascular function study, unspecified: Secondary | ICD-10-CM | POA: Insufficient documentation

## 2011-10-12 DIAGNOSIS — IMO0002 Reserved for concepts with insufficient information to code with codable children: Secondary | ICD-10-CM | POA: Insufficient documentation

## 2011-10-12 DIAGNOSIS — E039 Hypothyroidism, unspecified: Secondary | ICD-10-CM | POA: Insufficient documentation

## 2011-10-12 DIAGNOSIS — I6789 Other cerebrovascular disease: Secondary | ICD-10-CM | POA: Insufficient documentation

## 2011-10-12 DIAGNOSIS — I509 Heart failure, unspecified: Secondary | ICD-10-CM | POA: Insufficient documentation

## 2011-10-12 DIAGNOSIS — Z9181 History of falling: Secondary | ICD-10-CM | POA: Insufficient documentation

## 2011-10-12 DIAGNOSIS — R404 Transient alteration of awareness: Secondary | ICD-10-CM | POA: Insufficient documentation

## 2011-10-12 DIAGNOSIS — R42 Dizziness and giddiness: Secondary | ICD-10-CM | POA: Insufficient documentation

## 2011-10-12 DIAGNOSIS — R001 Bradycardia, unspecified: Secondary | ICD-10-CM | POA: Diagnosis not present

## 2011-10-12 DIAGNOSIS — R9431 Abnormal electrocardiogram [ECG] [EKG]: Secondary | ICD-10-CM | POA: Insufficient documentation

## 2011-10-12 DIAGNOSIS — R0602 Shortness of breath: Secondary | ICD-10-CM | POA: Insufficient documentation

## 2011-10-12 DIAGNOSIS — I10 Essential (primary) hypertension: Secondary | ICD-10-CM | POA: Insufficient documentation

## 2011-10-12 DIAGNOSIS — Z91041 Radiographic dye allergy status: Secondary | ICD-10-CM | POA: Insufficient documentation

## 2011-10-12 DIAGNOSIS — R4789 Other speech disturbances: Secondary | ICD-10-CM | POA: Insufficient documentation

## 2011-10-12 DIAGNOSIS — R269 Unspecified abnormalities of gait and mobility: Secondary | ICD-10-CM | POA: Insufficient documentation

## 2011-10-12 DIAGNOSIS — G514 Facial myokymia: Secondary | ICD-10-CM

## 2011-10-12 DIAGNOSIS — E876 Hypokalemia: Secondary | ICD-10-CM | POA: Insufficient documentation

## 2011-10-12 DIAGNOSIS — R29810 Facial weakness: Principal | ICD-10-CM | POA: Insufficient documentation

## 2011-10-12 DIAGNOSIS — I498 Other specified cardiac arrhythmias: Secondary | ICD-10-CM | POA: Insufficient documentation

## 2011-10-12 DIAGNOSIS — R4182 Altered mental status, unspecified: Secondary | ICD-10-CM | POA: Insufficient documentation

## 2011-10-12 DIAGNOSIS — E119 Type 2 diabetes mellitus without complications: Secondary | ICD-10-CM | POA: Insufficient documentation

## 2011-10-12 HISTORY — DX: Reserved for concepts with insufficient information to code with codable children: IMO0002

## 2011-10-12 HISTORY — DX: Abnormal result of cardiovascular function study, unspecified: R94.30

## 2011-10-12 HISTORY — DX: Radiographic dye allergy status: Z91.041

## 2011-10-12 LAB — URINALYSIS, ROUTINE W REFLEX MICROSCOPIC
Nitrite: NEGATIVE
Specific Gravity, Urine: 1.01 (ref 1.005–1.030)
Urobilinogen, UA: 1 mg/dL (ref 0.0–1.0)
pH: 7 (ref 5.0–8.0)

## 2011-10-12 LAB — CBC
HCT: 32.8 % — ABNORMAL LOW (ref 36.0–46.0)
Hemoglobin: 10.5 g/dL — ABNORMAL LOW (ref 12.0–15.0)
Hemoglobin: 10.9 g/dL — ABNORMAL LOW (ref 12.0–15.0)
MCH: 33.4 pg (ref 26.0–34.0)
MCH: 33.7 pg (ref 26.0–34.0)
MCHC: 33.2 g/dL (ref 30.0–36.0)
MCV: 101.9 fL — ABNORMAL HIGH (ref 78.0–100.0)
MCV: 101.5 fL — ABNORMAL HIGH (ref 78.0–100.0)
RBC: 3.14 MIL/uL — ABNORMAL LOW (ref 3.87–5.11)

## 2011-10-12 LAB — POCT I-STAT, CHEM 8
BUN: 6 mg/dL (ref 6–23)
Calcium, Ion: 1.13 mmol/L (ref 1.12–1.32)
Creatinine, Ser: 0.9 mg/dL (ref 0.50–1.10)
TCO2: 28 mmol/L (ref 0–100)

## 2011-10-12 LAB — TSH: TSH: 9.963 u[IU]/mL — ABNORMAL HIGH (ref 0.350–4.500)

## 2011-10-12 LAB — MAGNESIUM: Magnesium: 2.3 mg/dL (ref 1.5–2.5)

## 2011-10-12 LAB — GLUCOSE, CAPILLARY
Glucose-Capillary: 123 mg/dL — ABNORMAL HIGH (ref 70–99)
Glucose-Capillary: 106 mg/dL — ABNORMAL HIGH (ref 70–99)

## 2011-10-12 LAB — CARDIAC PANEL(CRET KIN+CKTOT+MB+TROPI)
Relative Index: INVALID (ref 0.0–2.5)
Troponin I: <0.3 ng/mL (ref <0.30)

## 2011-10-12 LAB — POTASSIUM: Potassium: 3.5 mEq/L (ref 3.5–5.1)

## 2011-10-12 LAB — HEMOGLOBIN A1C: Hgb A1c MFr Bld: 5.9 % — ABNORMAL HIGH (ref <5.7)

## 2011-10-12 MED ORDER — LUTEIN 10 MG PO TABS: 1.0000 | ORAL_TABLET | Freq: Every day | ORAL | Status: DC

## 2011-10-12 MED ORDER — POLYETHYL GLYCOL-PROPYL GLYCOL 0.4-0.3 % OP SOLN: 2.0000 [drp] | Freq: Three times a day (TID) | OPHTHALMIC | Status: DC | PRN

## 2011-10-12 MED ORDER — BUPROPION HCL 100 MG PO TABS
100.0000 mg | ORAL_TABLET | Freq: Every day | ORAL | Status: DC
  Administered 2011-10-12 – 2011-10-14 (×3): 100 mg via ORAL
  Filled 2011-10-12 (×3): qty 1

## 2011-10-12 MED ORDER — SODIUM CHLORIDE 0.9 % IJ SOLN
3.0000 mL | Freq: Two times a day (BID) | INTRAMUSCULAR | Status: DC
  Administered 2011-10-13 – 2011-10-14 (×2): 3 mL via INTRAVENOUS

## 2011-10-12 MED ORDER — VITAMIN D3 25 MCG (1000 UNIT) PO TABS
1000.0000 [IU] | ORAL_TABLET | Freq: Every day | ORAL | Status: DC
  Administered 2011-10-12 – 2011-10-14 (×3): 1000 [IU] via ORAL
  Filled 2011-10-12 (×3): qty 1

## 2011-10-12 MED ORDER — ALPRAZOLAM 0.25 MG PO TABS
0.2500 mg | ORAL_TABLET | Freq: Every evening | ORAL | Status: DC | PRN
  Administered 2011-10-13: 0.25 mg via ORAL
  Filled 2011-10-12: qty 1

## 2011-10-12 MED ORDER — ONDANSETRON HCL 4 MG/2ML IJ SOLN
4.0000 mg | Freq: Once | INTRAMUSCULAR | Status: AC
  Administered 2011-10-12: 4 mg via INTRAVENOUS
  Filled 2011-10-12: qty 2

## 2011-10-12 MED ORDER — INSULIN ASPART 100 UNIT/ML ~~LOC~~ SOLN
0.0000 [IU] | Freq: Three times a day (TID) | SUBCUTANEOUS | Status: DC
  Filled 2011-10-12: qty 3

## 2011-10-12 MED ORDER — HYPROMELLOSE (GONIOSCOPIC) 2.5 % OP SOLN
2.0000 [drp] | Freq: Three times a day (TID) | OPHTHALMIC | Status: DC | PRN
  Filled 2011-10-12: qty 15

## 2011-10-12 MED ORDER — POTASSIUM CHLORIDE CRYS ER 20 MEQ PO TBCR
40.0000 meq | EXTENDED_RELEASE_TABLET | ORAL | Status: AC
  Administered 2011-10-12: 40 meq via ORAL
  Filled 2011-10-12 (×2): qty 2

## 2011-10-12 MED ORDER — POTASSIUM CHLORIDE CRYS ER 20 MEQ PO TBCR
40.0000 meq | EXTENDED_RELEASE_TABLET | Freq: Once | ORAL | Status: AC
  Administered 2011-10-12: 40 meq via ORAL
  Filled 2011-10-12: qty 2

## 2011-10-12 MED ORDER — LEVOTHYROXINE SODIUM 100 MCG PO TABS
100.0000 ug | ORAL_TABLET | Freq: Every day | ORAL | Status: DC
  Administered 2011-10-12 – 2011-10-14 (×3): 100 ug via ORAL
  Filled 2011-10-12 (×3): qty 1

## 2011-10-12 MED ORDER — ENOXAPARIN SODIUM 30 MG/0.3ML ~~LOC~~ SOLN: 30.0000 mg | SUBCUTANEOUS | Status: DC

## 2011-10-12 MED ORDER — ACETAMINOPHEN 325 MG PO TABS
650.0000 mg | ORAL_TABLET | Freq: Four times a day (QID) | ORAL | Status: DC | PRN
  Administered 2011-10-14: 650 mg via ORAL

## 2011-10-12 MED ORDER — CALCIUM CARBONATE 600 MG PO TABS
600.0000 mg | ORAL_TABLET | Freq: Every day | ORAL | Status: DC
  Filled 2011-10-12: qty 1

## 2011-10-12 MED ORDER — ACETAMINOPHEN 325 MG PO TABS: 650.0000 mg | ORAL_TABLET | ORAL | Status: DC | PRN

## 2011-10-12 MED ORDER — INSULIN ASPART 100 UNIT/ML ~~LOC~~ SOLN: 0.0000 [IU] | Freq: Every day | SUBCUTANEOUS | Status: DC

## 2011-10-12 MED ORDER — ENOXAPARIN SODIUM 40 MG/0.4ML ~~LOC~~ SOLN: 40.0000 mg | Freq: Every day | SUBCUTANEOUS | Status: DC

## 2011-10-12 MED ORDER — CALCIUM CARBONATE 1250 (500 CA) MG PO TABS
1.0000 | ORAL_TABLET | Freq: Every day | ORAL | Status: DC
  Administered 2011-10-12 – 2011-10-14 (×3): 500 mg via ORAL
  Filled 2011-10-12 (×3): qty 1

## 2011-10-12 MED ORDER — NITROFURANTOIN MONOHYD MACRO 100 MG PO CAPS
100.0000 mg | ORAL_CAPSULE | Freq: Two times a day (BID) | ORAL | Status: DC
  Administered 2011-10-12 – 2011-10-13 (×3): 100 mg via ORAL
  Filled 2011-10-12 (×5): qty 1

## 2011-10-12 MED ORDER — ACETAMINOPHEN 650 MG RE SUPP: 650.0000 mg | Freq: Four times a day (QID) | RECTAL | Status: DC | PRN

## 2011-10-12 MED ORDER — ASPIRIN 81 MG PO CHEW
324.0000 mg | CHEWABLE_TABLET | Freq: Once | ORAL | Status: AC
  Administered 2011-10-12: 324 mg via ORAL
  Filled 2011-10-12: qty 4

## 2011-10-12 MED ORDER — ASPIRIN 325 MG PO TABS
325.0000 mg | ORAL_TABLET | Freq: Every day | ORAL | Status: DC
  Administered 2011-10-12 – 2011-10-14 (×3): 325 mg via ORAL
  Filled 2011-10-12 (×3): qty 1

## 2011-10-12 MED ORDER — SODIUM CHLORIDE 0.9 % IV SOLN
INTRAVENOUS | Status: AC
  Administered 2011-10-12: 12:00:00 via INTRAVENOUS

## 2011-10-12 MED ORDER — SODIUM CHLORIDE 0.9 % IV SOLN: 250.0000 mL | INTRAVENOUS | Status: DC | PRN

## 2011-10-12 MED ORDER — ONDANSETRON 4 MG PO TBDP: 8.0000 mg | ORAL_TABLET | Freq: Three times a day (TID) | ORAL | Status: DC | PRN

## 2011-10-12 MED ORDER — ENOXAPARIN SODIUM 40 MG/0.4ML ~~LOC~~ SOLN
40.0000 mg | Freq: Every day | SUBCUTANEOUS | Status: DC
  Administered 2011-10-12 – 2011-10-13 (×2): 40 mg via SUBCUTANEOUS
  Filled 2011-10-12 (×3): qty 0.4

## 2011-10-12 MED ORDER — LISINOPRIL 5 MG PO TABS
5.0000 mg | ORAL_TABLET | Freq: Every day | ORAL | Status: DC
  Administered 2011-10-12 – 2011-10-14 (×3): 5 mg via ORAL
  Filled 2011-10-12 (×3): qty 1

## 2011-10-12 MED ORDER — SODIUM CHLORIDE 0.9 % IJ SOLN: 3.0000 mL | INTRAMUSCULAR | Status: DC | PRN

## 2011-10-12 MED ORDER — AMITRIPTYLINE HCL 10 MG PO TABS
10.0000 mg | ORAL_TABLET | Freq: Every day | ORAL | Status: DC
  Administered 2011-10-12 – 2011-10-13 (×2): 10 mg via ORAL
  Filled 2011-10-12 (×3): qty 1

## 2011-10-12 NOTE — Progress Notes (Signed)
Smiley Houseman 10/12/2011, 2:39 PM Bilateral carotid artery duplex completed at 11:05.  Preliminary report is no evidence of significant ICA stenosis.

## 2011-10-12 NOTE — ED Notes (Signed)
Per EMS: CBG [id number]

## 2011-10-12 NOTE — ED Notes (Signed)
Pt's right eye began twitching during the assessment for about two minutes and resolved.  Right sided facial droop was noted -

## 2011-10-12 NOTE — H&P (Addendum)
Hospital Admission Note Date: 10/12/2011  Patient name: Paula [name] Medical record number: [id number] Date of birth: 1927-06-25 Age: 77 y.o. Gender: female PCP: Georgianne Fick, MD, MD  Attending physician: Lawrence Marseilles. Ashley Royalty  Chief Complaint: Twitching in her face bilaterally left greater than right  History of Present Illness: Paula [name] an 77 year old female who is very well-known to me from a recent previous admission. On that admission the patient had a third degree heart block which was thoroughly evaluated by cardiology and found to be likely vasovagal in nature. Today the patient presents with complaints of twitching in her bilateral face left greater than right. The patient has a history of having twitching in her left side of her face for many years. This complaint was addressed by her primary care physician Dr. Nicholos Johns who gave her Xanax for this. Patient states that she will this morning with twitching in her face left side greater than right side. She became concerned as in the past and mostly been on the left side of her face. The patient denies any drooling she denies any numbness or tingling in her face she denies any weakness in any other parts of her body. The patient felt that she had no associated symptoms however her husband felt that her speech might have been somewhat slurred but however unsure about that. Presently Paula [name] is fully awake alert oriented x3 she states that the twitching has diminished considerably and she describes no other neurological complaints. He denies any chest pain, any dizziness, any nausea vomiting or diarrhea, any fever or chills, any orthopnea, any PND. Scheduled Meds:   . aspirin  324 mg Oral Once  . aspirin  325 mg Oral Daily  . insulin aspart  0-15 Units Subcutaneous TID WC  . insulin aspart  0-5 Units Subcutaneous QHS  . ondansetron  4 mg Intravenous Once  . potassium chloride  40 mEq Oral Once   Continuous Infusions:   .  sodium chloride     PRN Meds:.acetaminophen Allergies: Contrast media; Sulfa antibiotics; Iohexol; and Penicillins Past Medical History  Diagnosis Date  . Diabetes mellitus   . Thyroid disease   . Abdominal aneurysm   . COPD (chronic obstructive pulmonary disease)   . Hypertension   . Shortness of breath   . Hypothyroidism   . Depression   . Recurrent upper respiratory infection (URI)   . Seizures    Past Surgical History  Procedure Date  . Cholecystectomy   . Joint replacement   . Sinus surgery with instatrak   . Appendectomy   . Oophorectomy   . Arthroscopic repair acl    Family History  Problem Relation Age of Onset  . Cancer Father    History   Social History  . Marital Status: Married    Spouse Name: N/A    Number of Children: N/A  . Years of Education: N/A   Occupational History  . Not on file.   Social History Main Topics  . Smoking status: Former Smoker    Quit date: 08/31/2011  . Smokeless tobacco: Not on file  . Alcohol Use: No  . Drug Use: No  . Sexually Active:    Other Topics Concern  . Not on file   Social History Narrative  . No narrative on file   Review of Systems: A comprehensive review of systems was negative except as noted in the history of present illness.   Physical Exam: No intake or output data in the 24 hours ending  10/12/11 1538 General: Alert, awake, oriented x3, in no acute distress.  HEENT: [place]/AT PEERL, EOMI Neck: Trachea midline,  no masses, no thyromegal,y no JVD, no carotid bruit OROPHARYNX:  Moist, No exudate/ erythema/lesions.  Heart: Regular rate and rhythm, without murmurs, rubs, gallops, PMI non-displaced, no heaves or thrills on palpation.  Lungs: Clear to auscultation, no wheezing or rhonchi noted. No increased vocal fremitus resonant to percussion  Abdomen: Soft, nontender, nondistended, positive bowel sounds, no masses no hepatosplenomegaly noted..  Neuro: No focal neurological deficits noted cranial nerves  II through XII grossly intact. DTRs 2+ bilaterally upper and lower extremities. Strength functional in bilateral upper and lower extremities. Musculoskeletal: No warm swelling or erythema around joints, no spinal tenderness noted. Psychiatric: Patient alert and oriented x3, good insight and cognition, good recent to remote recall. Lymph node survey: No cervical axillary or inguinal lymphadenopathy noted.  Lab results:  [facility] 10/12/11 0507  NA 141  K 2.9*  CL 99  CO2 --  GLUCOSE 117*  BUN 6  CREATININE 0.90  CALCIUM --  MG --  PHOS --   No results found for this basename: AST:2,ALT:2,ALKPHOS:2,BILITOT:2,PROT:2,ALBUMIN:2 in the last 72 hours No results found for this basename: LIPASE:2,AMYLASE:2 in the last 72 hours  Basename 10/12/11 0507 10/12/11 0444  WBC -- 7.3  NEUTROABS -- --  HGB 10.9* 10.5*  HCT 32.0* 32.0*  MCV -- 101.9*  PLT -- 274    Basename 10/12/11 1139  CKTOTAL 27  CKMB 2.0  CKMBINDEX --  TROPONINI <0.30   No results found for this basename: POCBNP:3 in the last 72 hours No results found for this basename: DDIMER:2 in the last 72 hours No results found for this basename: HGBA1C:2 in the last 72 hours No results found for this basename: CHOL:2,HDL:2,LDLCALC:2,TRIG:2,CHOLHDL:2,LDLDIRECT:2 in the last 72 hours No results found for this basename: TSH,T4TOTAL,FREET3,T3FREE,THYROIDAB in the last 72 hours No results found for this basename: VITAMINB12:2,FOLATE:2,FERRITIN:2,TIBC:2,IRON:2,RETICCTPCT:2 in the last 72 hours Imaging results:  Dg Chest 2 View  10/05/2011  *RADIOLOGY REPORT*  Clinical Data: Shortness of breath.  History of aortic aneurysm. Recently hospitalized for hypertension.  Ex-smoker as of 6 weeks ago.  CHEST - 2 VIEW  Comparison: 09/19/2011  Findings: Degraded lateral view secondary patient positioning. Underlying hyperinflation.  Mild osteopenia.  Cardiomegaly with atherosclerosis in the transverse aorta.  A small left pleural effusion is  decreased since 09/19/2011. No pneumothorax.  Biapical pleural thickening. No congestive failure.  Mild bibasilar volume loss.  IMPRESSION:  1.  Cardiomegaly and hyperinflation, without congestive failure. 2.  Small left pleural effusion.  Original Report Authenticated By: Consuello Bossier, M.D.   Dg Chest 2 View  09/15/2011  *RADIOLOGY REPORT*  Clinical Data: Lies weakness  CHEST - 2 VIEW  Comparison: 08/04/2011  Findings: Mild cardiomegaly.  Left midlung calcified granuloma. There are ill-defined opacities at the left base worrisome for patchy airspace disease.  Lungs are otherwise clear.  Pulmonary vascularity is within normal limits.  IMPRESSION: Patchy opacities at the left base worrisome for airspace disease. Follow-up radiographs until resolution are recommended.  Original Report Authenticated By: Donavan Burnet, M.D.   Ct Head Wo Contrast  10/12/2011  *RADIOLOGY REPORT*  Clinical Data: Facial droop.  CT HEAD WITHOUT CONTRAST  Technique:  Contiguous axial images were obtained from the base of the skull through the vertex without contrast.  Comparison: CT of the head performed 09/19/2011  Findings: There is no evidence of acute infarction, mass lesion, or intra- or extra-axial hemorrhage on CT.  Prominence of the ventricles and sulci reflects mild to moderate cortical volume loss.  The degree of ventricular prominence appears grossly stable from multiple prior studies.  Periventricular and subcortical white matter change likely reflects small vessel ischemic microangiopathy.  The posterior fossa, including the cerebellum, brainstem and fourth ventricle, is within normal limits.  The basal ganglia are unremarkable in appearance.  The cerebral hemispheres demonstrate grossly normal gray-white differentiation.  No mass effect or midline shift is seen.  There is no evidence of fracture; visualized osseous structures are unremarkable in appearance.  The visualized portions of the orbits are within normal  limits.  The paranasal sinuses and mastoid air cells are well-aerated.  No significant soft tissue abnormalities are seen.  IMPRESSION:  1.  No acute intracranial pathology seen on CT. 2.  Mild to moderate cortical volume loss and diffuse small vessel ischemic microangiopathy.  Original Report Authenticated By: Tonia Ghent, M.D.   Ct Head Wo Contrast  09/19/2011  *RADIOLOGY REPORT*  Clinical Data: Recent fall, altered mental status.  CT HEAD WITHOUT CONTRAST  Technique:  Contiguous axial images were obtained from the base of the skull through the vertex without contrast.  Comparison: 09/18/2011 MRI  Findings: Periventricular and subcortical white matter hypodensities are most in keeping with chronic microangiopathic change. There is no evidence for acute hemorrhage, hydrocephalus, mass lesion, or abnormal extra-axial fluid collection.  No definite CT evidence for acute infarction.  The visualized paranasal sinuses and mastoid air cells are predominately clear.  No displaced calvarial fracture.  Bilateral lens replacement.  IMPRESSION: No acute intracranial abnormality.  Unchanged white matter hypodensities.  Original Report Authenticated By: Waneta Martins, M.D.   Ct Head Wo Contrast  09/15/2011  *RADIOLOGY REPORT*  Clinical Data: Numbness.  Lightheadedness.  CT HEAD WITHOUT CONTRAST  Technique:  Contiguous axial images were obtained from the base of the skull through the vertex without contrast.  Comparison: None.  Findings: Global atrophy.  Chronic ischemic changes in the periventricular white matter.  No mass effect, midline shift, or acute intracranial hemorrhage.  Mastoid air cells are clear. Visualized paranasal sinuses are clear.  Cranium is intact.  IMPRESSION: No acute intracranial pathology.  Chronic changes.  Original Report Authenticated By: Donavan Burnet, M.D.   Mr Brain Wo Contrast  09/18/2011  *RADIOLOGY REPORT*  Clinical Data: 77 year old female with confusion, dysphasia, unsteady  gait.  The patient reports an allergy to contrast (listed in the medical record is iodinated contrast) and declined gadolinium contrast for this exam.  MRI HEAD WITHOUT CONTRAST  Technique:  Multiplanar, multiecho pulse sequences of the brain and surrounding structures were obtained according to standard protocol without intravenous contrast.  Comparison: Head CTs 09/15/2011 and earlier. Neck CT 05/13/2011.  Findings: No restricted diffusion to suggest acute infarction.  No midline shift, mass effect, evidence of mass lesion, ventriculomegaly, extra-axial collection or acute intracranial hemorrhage.  Cervicomedullary junction and pituitary are within normal limits.  Major intracranial vascular flow voids are preserved with a degree of intracranial artery dolichoectasia.  Scattered chronic micro hemorrhages, mostly in the frontal lobe subcortical white matter (series 7 image 12, 13).  Confluent periventricular and central cerebral white matter T2 and FLAIR hyperintensity.  Comparatively mild T2 hyperintensity in the pons. Deep gray matter nuclei and cerebellum within normal limits for age.  Negative visualized cervical spine. Visualized bone marrow signal is within normal limits.  Grossly normal visualized internal auditory structures.  Postoperative changes to the globes. Visualized paranasal sinuses and mastoids are clear.  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 This is stable since 05/13/2011.  IMPRESSION: 1. No acute intracranial abnormality.  Chronic small vessel disease. 2.  Right parotid gland mass is stable since 05/13/2011, but decreased T2 signal is noted and tumors with low signal intensity on T2-weighted images are more likely to be malignant even when they are well-demarcated.  Recommend ENT follow-up.  Original Report Authenticated By: Harley Hallmark, M.D.   Nm Pulmonary Per & Vent  10/05/2011  *RADIOLOGY  REPORT*  Clinical Data:  Shortness of breath, increased D-dimer, contrast allergy  NUCLEAR MEDICINE VENTILATION - PERFUSION LUNG SCAN  Technique:  Wash-in, equilibrium, and wash-out phase ventilation images were obtained using Xe-133 gas.  Perfusion images were obtained in multiple projections after intravenous injection of Tc- 88m MAA.  Radiopharmaceuticals:  10 mCi Xe-133 gas and 6.0 mCi Tc-64m MAA.  Comparison:  None Correlation:  Chest radiograph 10/05/2011  Findings: Ventilation exam in anterior and posterior projections demonstrates a slightly smaller left lung than right. Minimal nonsegmental diminished ventilation at left lung base. No significant xenon retention.  Perfusion lung scan in eight projections shows slightly smaller left lung than right. Minimal nonsegmental diminished perfusion at left lung base. No segmental or subsegmental perfusion defects identified.  Chest radiograph is significant for cardiomegaly and small left pleural effusion.  IMPRESSION: Minimal nonsegmental diminished perfusion and ventilation at left lung base, likely related to small left pleural effusion. Findings represent a very low probability pulmonary embolism.  Original Report Authenticated By: Lollie Marrow, M.D.   Dg Chest Portable 1 View  09/20/2011  *RADIOLOGY REPORT*  Clinical Data: Pneumonia  PORTABLE CHEST - 1 VIEW  Comparison: 09/15/2011  Findings: A right internal jugular vein temporary pacemaker has been placed.  Tip projects over the right ventricle.  Heart is moderately enlarged.  Vascular congestion has developed.  Bibasilar haziness worrisome for developing airspace disease or edema is worse.  No pneumothorax.  IMPRESSION: Right internal jugular vein temporary pacemaker tip projects over the right ventricle and no pneumothorax  Increasing vascular congestion and bibasilar airspace disease worrisome for edema.  Original Report Authenticated By: Donavan Burnet, M.D.   Other results: EKG: Inversion of T  waves in leads V2 through V5  which was present on previous EKG from 10/07/2011 but absent on EKG from 09/15/2011.    Assessment and Plan: 1. Facial twitching and: Paula [name] presents with twitching bilaterally in her face left greater than right which is a chronic problem that seems to have intensified on today. At this degrees heart is a presentation of a stroke or TIA. But rather seems to implicate pathology may be in the high cervical area or some type of mental tumor that may be causing compression on the nerves. The patient had a recent carotid duplex and 2-D echocardiogram less than a month ago and thus I will cancel these have been ordered by Dr. Felipa Furnace. I will however proceed with the MRI of the brain. This patient may however benefit from evaluation of the upper cervical area or facial x-ray to look for any mental masses that may be contributing to the fasciculations. The patient does have a known parotid mass I am unsure of its role in the fasciculations that she's experienced in in the lower facial area. We will observe the patient while here in the hospital and if warranted ask neurology to evaluate  her.   2. Abnormal EKG: I will recheck the EKG at this time. I also check electrolytes listed including magnesium. The patient's initial cardiac enzymes were negative and her initial CT does not show any intracranial abnormalities. Patient is presently down for an MRI and I will review the EKG when she returns and make further decisions pending the EKG.  3. Parotid mass followed by Dr. Pollyann Kennedy: This parotid mass may actually be contributory to the facial twitching that this patient is having I will speak with Dr. Pollyann Kennedy is some one of his colleagues regarding this.  4. Diabetes type 2: The patient was managed with sliding scale insulin  5. Hypertension: Continue lisinopril and Lasix.  6. Hypokalemia: Will replete orally and rechecked after repletion.  7. Hypothyroidism: I will also check a TSH  on the patient.  8. DVT prophylaxis: We'll treat with SCDs until MRI completed.     Ariyon Gerstenberger A. 10/12/2011, 3:38 PM

## 2011-10-12 NOTE — Progress Notes (Signed)
Physical Therapy Evaluation Patient Details Name: MANALI MCELMURRY MRN: [id number] DOB: August 30, 1927 Today's Date: 10/12/2011  Problem List:  Patient Active Problem List  Diagnoses  . COPD (chronic obstructive pulmonary disease) with emphysema  . Diabetes mellitus type II, controlled  . Aneurysm of abdominal aorta  . Pneumonia, organism unspecified  . UTI (urinary tract infection)  . Episode of dizziness  . Parotid mass  . Hypothyroidism  . Complete heart block  . Sinus bradycardia  . Hypokalemia  . Delirium  . Suspected CHF (congestive heart failure)  . Anxiety state, unspecified  . Congestive heart failure    Past Medical History:  Past Medical History  Diagnosis Date  . Diabetes mellitus   . Thyroid disease   . Abdominal aneurysm   . COPD (chronic obstructive pulmonary disease)   . Hypertension   . Shortness of breath   . Hypothyroidism   . Depression   . Recurrent upper respiratory infection (URI)   . Seizures    Past Surgical History:  Past Surgical History  Procedure Date  . Cholecystectomy   . Joint replacement   . Sinus surgery with instatrak   . Appendectomy   . Oophorectomy   . Arthroscopic repair acl     PT Assessment/Plan/Recommendation PT Assessment Clinical Impression Statement: Pt presents to [facility] with complaints of facial twitching and numbness. She reports this is to her lower face bilaterally with it affecting L greater than right. I did not observe any facial droop nor twitching. Pt definitely demonstrating overall general weakness with strength 3+/5 grossly in all extremities. She is unsteady without any sort of assistive device and at risk for falls because of this weakness. She has a poor tolerance for activity which is probably a combination of aggitation from being in the hospital and overall generalized weakness. Will benefit from PT while in acute setting. At this point I would recommend a short stay rehab at [facility] for continued strengthening prior  to d/c home unless husband is able to provide 24 hour minA.  PT Recommendation/Assessment: Patient will need skilled PT in the acute care venue PT Problem List: Decreased strength;Decreased activity tolerance;Decreased balance;Decreased mobility;Impaired sensation Barriers to Discharge: Decreased caregiver support Barriers to Discharge Comments: ?? how much support can husband offer? PT Therapy Diagnosis : Difficulty walking;Generalized weakness;Abnormality of gait PT Plan PT Frequency: Min 3X/week PT Treatment/Interventions: Gait training;Functional mobility training;DME instruction;Therapeutic activities;Therapeutic exercise;Balance training;Neuromuscular re-education;Stair training;Patient/family education PT Recommendation Recommendations for Other Services: OT consult Follow Up Recommendations: Skilled nursing facility Equipment Recommended: None recommended by PT (pt reports she has a RW at home) PT Goals  Acute Rehab PT Goals PT Goal Formulation: With patient Pt will go Supine/Side to Sit: with modified independence PT Goal: Supine/Side to Sit - Progress: Progressing toward goal Pt will go Sit to Supine/Side: with modified independence PT Goal: Sit to Supine/Side - Progress: Progressing toward goal Pt will go Sit to Stand: with modified independence PT Goal: Sit to Stand - Progress: Progressing toward goal Pt will go Stand to Sit: with modified independence PT Goal: Stand to Sit - Progress: Progressing toward goal Pt will Transfer Bed to Chair/Chair to Bed: with modified independence PT Transfer Goal: Bed to Chair/Chair to Bed - Progress: Progressing toward goal Pt will Stand: with modified independence;3 - 5 min;with no upper extremity support (while performing UE exercises) PT Goal: Stand - Progress: Progressing toward goal Pt will Ambulate: 51 - 150 feet;with modified independence;with least restrictive assistive device PT Goal: Ambulate - Progress: Progressing  toward goal Pt  will Go Up / Down Stairs: 1-2 stairs;with supervision;with least restrictive assistive device PT Goal: Up/Down Stairs - Progress: Progressing toward goal Pt will Perform Home Exercise Program: Independently PT Goal: Perform Home Exercise Program - Progress: Progressing toward goal Additional Goals Additional Goal #1: Pt will demonstrate decreased risk of falls with gait speed adequate for neighborhood ambulation  (>1.31 ft/sec). PT Goal: Additional Goal #1 - Progress: Progressing toward goal  PT Evaluation Precautions/Restrictions  Precautions Precautions: Fall Prior Functioning  Home Living Lives With: Spouse Receives Help From: Family Type of Home: House Home Layout: One level Home Access: Stairs to enter Secretary/administrator of Steps: 1 Bathroom Shower/Tub: Engineer, manufacturing systems: Standard Home Adaptive Equipment: Environmental consultant - rolling;Straight cane Additional Comments: husband can help with cooking Prior Function Level of Independence: Independent with basic ADLs;Independent with homemaking with ambulation;Independent with transfers;Independent with gait Driving: No Vocation: Retired Producer, television/film/video: Awake/alert Overall Cognitive Status: Appears within functional limits for tasks assessed Attention: Appears overall intact for tasks assessed Orientation Level: Oriented X4 Sensation/Coordination Sensation Light Touch: Impaired Detail Light Touch Impaired Details:  (numbness bilateral lower face around mouth L>R) Additional Comments: pt peform RAM but very slowly and weakly Coordination Gross Motor Movements are Fluid and Coordinated: Yes Fine Motor Movements are Fluid and Coordinated: Yes Finger Nose Finger Test: very slow but no ataxia noted Extremity Assessment RUE Strength RUE Overall Strength Comments: pt is generally weak with MMT grossly 3-3+/5; appears to fatigue quickly LUE Strength LUE Overall Strength Comments: pt generally weak  with MMT grossly 3-3+/5 RLE Strength RLE Overall Strength Comments: grossly 3+/5 LLE Strength LLE Overall Strength Comments: 3+/5 grossly Mobility (including Balance) Bed Mobility Bed Mobility: Yes Supine to Sit: 4: Min assist Supine to Sit Details (indicate cue type and reason): min tactile support for safety when getting OOB Sitting - Scoot to Edge of Bed: 5: Supervision Transfers Sit to Stand: 4: Min assist;From bed;From chair/3-in-1 Sit to Stand Details (indicate cue type and reason): min RUE HHA for safety and stability once standing pt reaching for support; crouched stance Stand to Sit: 4: Min assist;To chair/3-in-1 Stand to Sit Details: cues for safe hand placement and gentle tactile cueing for safe descent Stand Pivot Transfers: 4: Min Actuary Details (indicate cue type and reason): HHA RUE for pivot to chair Ambulation/Gait Ambulation/Gait: Yes Ambulation/Gait Assistance: 4: Min assist Ambulation/Gait Assistance Details (indicate cue type and reason): pt reaching for UE support; RUE HHA for stability as pt reaching for environmental supports (reports she was not using a RW at home and that this weakness is new); slow and gaurded gait with trunk flexed Ambulation Distance (Feet): 12 Feet Assistive device: None Gait Pattern: Trunk flexed  Static Standing Balance Static Standing - Balance Support: Right upper extremity supported Static Standing - Level of Assistance: 4: Min assist Exercise  Total Joint Exercises Ankle Circles/Pumps: AROM;5 reps;Seated End of Session PT - End of Session Equipment Utilized During Treatment: Gait belt Activity Tolerance: Patient limited by fatigue (pt very preoccupied with facial numbness) Patient left: in chair;with call bell in reach Nurse Communication: Mobility status for transfers;Mobility status for ambulation (pt's concern for allergy to contrast solution for MRI) General Behavior During Session: Agitated (believes  no one is listening to her) Cognition:  (slower but appears [facility])  WHITLOW,Jafet Wissing [name] 10/12/2011, 2:28 PM

## 2011-10-12 NOTE — ED Provider Notes (Signed)
History     CSN: [id number] Arrival date & time: 10/12/2011  3:13 AM   First MD Initiated Contact with Patient 10/12/11 0425      Chief Complaint  Patient presents with  . Facial Droop    (Consider location/radiation/quality/duration/timing/severity/associated sxs/prior treatment) HPI History is obtained from patient and from patient's husband. Patient awakened with right facial droop and  12 midnight tonight accompanied by difficulty speech denies shortness of breath. No treatment prior to coming here no headache admits to nausea no other complaint no other associated symptoms. Facial droop improving with time her husband speech is currently normal. No pain anywhere no numbness or weakness in arms Past Medical History  Diagnosis Date  . Diabetes mellitus   . Thyroid disease   . Abdominal aneurysm   . COPD (chronic obstructive pulmonary disease)   . Hypertension   . Shortness of breath   . Hypothyroidism   . Depression   . Recurrent upper respiratory infection (URI)   . Seizures     Past Surgical History  Procedure Date  . Cholecystectomy   . Joint replacement   . Sinus surgery with instatrak   . Appendectomy   . Oophorectomy   . Arthroscopic repair acl     Family History  Problem Relation Age of Onset  . Cancer Father     History  Substance Use Topics  . Smoking status: Former Smoker    Quit date: 08/31/2011  . Smokeless tobacco: Not on file  . Alcohol Use: No    OB History    Grav Para Term Preterm Abortions TAB SAB Ect Mult Living                  Review of Systems  Constitutional: Negative.   HENT: Negative.   Respiratory: Negative.   Cardiovascular: Negative.   Gastrointestinal: Negative.   Musculoskeletal: Negative.   Skin: Negative.   Neurological: Positive for facial asymmetry and speech difficulty.  Hematological: Negative.   Psychiatric/Behavioral: Negative.   All other systems reviewed and are negative.    Allergies  Contrast  media; Sulfa antibiotics; Iohexol; and Penicillins  Home Medications   Current Outpatient Rx  Name Route Sig Dispense Refill  . ACETAMINOPHEN 500 MG PO TABS Oral Take 500 mg by mouth every 6 (six) hours as needed. Headache, pain or fever     . ALPRAZOLAM 0.25 MG PO TABS Oral Take 0.25 mg by mouth at bedtime as needed. TAKES 1 TAB IN THE MORNING IN 2 TABS IN THE EVENING    . AMITRIPTYLINE HCL 10 MG PO TABS Oral Take 10 mg by mouth at bedtime.      . BUPROPION HCL 100 MG PO TABS Oral Take 100 mg by mouth daily.     Marland Kitchen CALCIUM CARBONATE 600 MG PO TABS Oral Take 600 mg by mouth daily.      Marland Kitchen VITAMIN D 1000 UNITS PO TABS Oral Take 1,000 Units by mouth daily.      . FUROSEMIDE 20 MG PO TABS Oral Take 1 tablet (20 mg total) by mouth daily. 30 tablet 0  . LEVOTHYROXINE SODIUM 100 MCG PO TABS Oral Take 100 mcg by mouth daily.      Marland Kitchen LISINOPRIL 5 MG PO TABS Oral Take 1 tablet (5 mg total) by mouth daily. 30 tablet 0  . LUTEIN 10 MG PO TABS Oral Take 1 tablet by mouth daily. Hold while in hospital    . NITROFURANTOIN MONOHYD MACRO 100 MG PO CAPS Oral  Take 1 capsule (100 mg total) by mouth 2 (two) times daily. 10 capsule 0  . ONDANSETRON 8 MG PO TBDP Oral Take 1 tablet (8 mg total) by mouth every 8 (eight) hours as needed for nausea. 20 tablet 0  . SYSTANE OP Ophthalmic Apply 1-2 drops to eye every 8 (eight) hours as needed. Dry eye       BP 138/67  Pulse 90  Temp(Src) 98.5 F (36.9 C) (Oral)  Resp 21  Ht 5\' 5"  (1.651 m)  Wt 155 lb (70.308 kg)  BMI 25.79 kg/m2  SpO2 94%  Physical Exam  Nursing note and vitals reviewed. Constitutional: She appears well-developed and well-nourished.  HENT:  Head: Normocephalic and atraumatic.  Eyes: Conjunctivae are normal. Pupils are equal, round, and reactive to light.  Neck: Neck supple. No tracheal deviation present. No thyromegaly present.  Cardiovascular: Normal rate and regular rhythm.   No murmur heard. Pulmonary/Chest: Effort normal and breath  sounds normal.  Abdominal: Soft. Bowel sounds are normal. She exhibits no distension. There is no tenderness.  Musculoskeletal: Normal range of motion. She exhibits no edema and no tenderness.  Neurological: She is alert. She displays normal reflexes. A cranial nerve deficit is present. She exhibits normal muscle tone. Coordination normal.       Right mouth droop possibly consistent with central cranial nerve 7 deficit right side other cranial nerves II through XII grossly intact speech is normal  Skin: Skin is warm and dry. No rash noted.  Psychiatric: She has a normal mood and affect.    Date: 10/12/2011  Rate: 90  Rhythm: normal sinus rhythm  QRS Axis: normal  Intervals: normal  ST/T Wave abnormalities: T wave inversions across the precordium  Conduction Disutrbances:none  Narrative Interpretation:   Old EKG Reviewed: unchanged No significant change from 10/07/2011  ED Course  Procedures (including critical care time)  Labs Reviewed - No data to display No results found. Results for orders placed during the hospital encounter of 10/12/11  CBC      Component Value Range   WBC 7.3  4.0 - 10.5 (K/uL)   RBC 3.14 (*) 3.87 - 5.11 (MIL/uL)   Hemoglobin 10.5 (*) 12.0 - 15.0 (g/dL)   HCT 16.1 (*) 09.6 - 46.0 (%)   MCV 101.9 (*) 78.0 - 100.0 (fL)   MCH 33.4  26.0 - 34.0 (pg)   MCHC 32.8  30.0 - 36.0 (g/dL)   RDW 04.5  40.9 - 81.1 (%)   Platelets 274  150 - 400 (K/uL)  POCT I-STAT, CHEM 8      Component Value Range   Sodium 141  135 - 145 (mEq/L)   Potassium 2.9 (*) 3.5 - 5.1 (mEq/L)   Chloride 99  96 - 112 (mEq/L)   BUN 6  6 - 23 (mg/dL)   Creatinine, Ser 9.14  0.50 - 1.10 (mg/dL)   Glucose, Bld 782 (*) 70 - 99 (mg/dL)   Calcium, Ion 9.56  2.13 - 1.32 (mmol/L)   TCO2 28  0 - 100 (mmol/L)   Hemoglobin 10.9 (*) 12.0 - 15.0 (g/dL)   HCT 08.6 (*) 57.8 - 46.0 (%)   Dg Chest 2 View  10/05/2011  *RADIOLOGY REPORT*  Clinical Data: Shortness of breath.  History of aortic aneurysm.  Recently hospitalized for hypertension.  Ex-smoker as of 6 weeks ago.  CHEST - 2 VIEW  Comparison: 09/19/2011  Findings: Degraded lateral view secondary patient positioning. Underlying hyperinflation.  Mild osteopenia.  Cardiomegaly with atherosclerosis in the  transverse aorta.  A small left pleural effusion is decreased since 09/19/2011. No pneumothorax.  Biapical pleural thickening. No congestive failure.  Mild bibasilar volume loss.  IMPRESSION:  1.  Cardiomegaly and hyperinflation, without congestive failure. 2.  Small left pleural effusion.  Original Report Authenticated By: Consuello Bossier, M.D.   Dg Chest 2 View  09/15/2011  *RADIOLOGY REPORT*  Clinical Data: Lies weakness  CHEST - 2 VIEW  Comparison: 08/04/2011  Findings: Mild cardiomegaly.  Left midlung calcified granuloma. There are ill-defined opacities at the left base worrisome for patchy airspace disease.  Lungs are otherwise clear.  Pulmonary vascularity is within normal limits.  IMPRESSION: Patchy opacities at the left base worrisome for airspace disease. Follow-up radiographs until resolution are recommended.  Original Report Authenticated By: Donavan Burnet, M.D.   Ct Head Wo Contrast  10/12/2011  *RADIOLOGY REPORT*  Clinical Data: Facial droop.  CT HEAD WITHOUT CONTRAST  Technique:  Contiguous axial images were obtained from the base of the skull through the vertex without contrast.  Comparison: CT of the head performed 09/19/2011  Findings: There is no evidence of acute infarction, mass lesion, or intra- or extra-axial hemorrhage on CT.  Prominence of the ventricles and sulci reflects mild to moderate cortical volume loss.  The degree of ventricular prominence appears grossly stable from multiple prior studies.  Periventricular and subcortical white matter change likely reflects small vessel ischemic microangiopathy.  The posterior fossa, including the cerebellum, brainstem and fourth ventricle, is within normal limits.  The basal ganglia  are unremarkable in appearance.  The cerebral hemispheres demonstrate grossly normal gray-white differentiation.  No mass effect or midline shift is seen.  There is no evidence of fracture; visualized osseous structures are unremarkable in appearance.  The visualized portions of the orbits are within normal limits.  The paranasal sinuses and mastoid air cells are well-aerated.  No significant soft tissue abnormalities are seen.  IMPRESSION:  1.  No acute intracranial pathology seen on CT. 2.  Mild to moderate cortical volume loss and diffuse small vessel ischemic microangiopathy.  Original Report Authenticated By: Tonia Ghent, M.D.   Ct Head Wo Contrast  09/19/2011  *RADIOLOGY REPORT*  Clinical Data: Recent fall, altered mental status.  CT HEAD WITHOUT CONTRAST  Technique:  Contiguous axial images were obtained from the base of the skull through the vertex without contrast.  Comparison: 09/18/2011 MRI  Findings: Periventricular and subcortical white matter hypodensities are most in keeping with chronic microangiopathic change. There is no evidence for acute hemorrhage, hydrocephalus, mass lesion, or abnormal extra-axial fluid collection.  No definite CT evidence for acute infarction.  The visualized paranasal sinuses and mastoid air cells are predominately clear.  No displaced calvarial fracture.  Bilateral lens replacement.  IMPRESSION: No acute intracranial abnormality.  Unchanged white matter hypodensities.  Original Report Authenticated By: Waneta Martins, M.D.   Ct Head Wo Contrast  09/15/2011  *RADIOLOGY REPORT*  Clinical Data: Numbness.  Lightheadedness.  CT HEAD WITHOUT CONTRAST  Technique:  Contiguous axial images were obtained from the base of the skull through the vertex without contrast.  Comparison: None.  Findings: Global atrophy.  Chronic ischemic changes in the periventricular white matter.  No mass effect, midline shift, or acute intracranial hemorrhage.  Mastoid air cells are clear.  Visualized paranasal sinuses are clear.  Cranium is intact.  IMPRESSION: No acute intracranial pathology.  Chronic changes.  Original Report Authenticated By: Donavan Burnet, M.D.   Mr Brain Wo Contrast  09/18/2011  *RADIOLOGY  REPORT*  Clinical Data: 77 year old female with confusion, dysphasia, unsteady gait.  The patient reports an allergy to contrast (listed in the medical record is iodinated contrast) and declined gadolinium contrast for this exam.  MRI HEAD WITHOUT CONTRAST  Technique:  Multiplanar, multiecho pulse sequences of the brain and surrounding structures were obtained according to standard protocol without intravenous contrast.  Comparison: Head CTs 09/15/2011 and earlier. Neck CT 05/13/2011.  Findings: No restricted diffusion to suggest acute infarction.  No midline shift, mass effect, evidence of mass lesion, ventriculomegaly, extra-axial collection or acute intracranial hemorrhage.  Cervicomedullary junction and pituitary are within normal limits.  Major intracranial vascular flow voids are preserved with a degree of intracranial artery dolichoectasia.  Scattered chronic micro hemorrhages, mostly in the frontal lobe subcortical white matter (series 7 image 12, 13).  Confluent periventricular and central cerebral white matter T2 and FLAIR hyperintensity.  Comparatively mild T2 hyperintensity in the pons. Deep gray matter nuclei and cerebellum within normal limits for age.  Negative visualized cervical spine. Visualized bone marrow signal is within normal limits.  Grossly normal visualized internal auditory structures.  Postoperative changes to the globes. Visualized paranasal sinuses and mastoids are clear.  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 This is stable since 05/13/2011.  IMPRESSION: 1. No acute intracranial abnormality.  Chronic small vessel disease. 2.  Right parotid gland mass is stable since  05/13/2011, but decreased T2 signal is noted and tumors with low signal intensity on T2-weighted images are more likely to be malignant even when they are well-demarcated.  Recommend ENT follow-up.  Original Report Authenticated By: Harley Hallmark, M.D.   Nm Pulmonary Per & Vent  10/05/2011  *RADIOLOGY REPORT*  Clinical Data:  Shortness of breath, increased D-dimer, contrast allergy  NUCLEAR MEDICINE VENTILATION - PERFUSION LUNG SCAN  Technique:  Wash-in, equilibrium, and wash-out phase ventilation images were obtained using Xe-133 gas.  Perfusion images were obtained in multiple projections after intravenous injection of Tc- 20m MAA.  Radiopharmaceuticals:  10 mCi Xe-133 gas and 6.0 mCi Tc-32m MAA.  Comparison:  None Correlation:  Chest radiograph 10/05/2011  Findings: Ventilation exam in anterior and posterior projections demonstrates a slightly smaller left lung than right. Minimal nonsegmental diminished ventilation at left lung base. No significant xenon retention.  Perfusion lung scan in eight projections shows slightly smaller left lung than right. Minimal nonsegmental diminished perfusion at left lung base. No segmental or subsegmental perfusion defects identified.  Chest radiograph is significant for cardiomegaly and small left pleural effusion.  IMPRESSION: Minimal nonsegmental diminished perfusion and ventilation at left lung base, likely related to small left pleural effusion. Findings represent a very low probability pulmonary embolism.  Original Report Authenticated By: Lollie Marrow, M.D.   Dg Chest Portable 1 View  09/20/2011  *RADIOLOGY REPORT*  Clinical Data: Pneumonia  PORTABLE CHEST - 1 VIEW  Comparison: 09/15/2011  Findings: A right internal jugular vein temporary pacemaker has been placed.  Tip projects over the right ventricle.  Heart is moderately enlarged.  Vascular congestion has developed.  Bibasilar haziness worrisome for developing airspace disease or edema is worse.  No  pneumothorax.  IMPRESSION: Right internal jugular vein temporary pacemaker tip projects over the right ventricle and no pneumothorax  Increasing vascular congestion and bibasilar airspace disease worrisome for edema.  Original Report Authenticated By: Donavan Burnet, M.D.     No diagnosis found.  6:20 AM exam is unchanged patient continues to exhibit slight right mouth droop speech is clear moves all extremities  MDM   Code stroke not called as patient's symptoms improving with time and neurologic findings subtle Results for orders placed during the hospital encounter of 10/12/11  CBC      Component Value Range   WBC 7.3  4.0 - 10.5 (K/uL)   RBC 3.14 (*) 3.87 - 5.11 (MIL/uL)   Hemoglobin 10.5 (*) 12.0 - 15.0 (g/dL)   HCT 03.4 (*) 74.2 - 46.0 (%)   MCV 101.9 (*) 78.0 - 100.0 (fL)   MCH 33.4  26.0 - 34.0 (pg)   MCHC 32.8  30.0 - 36.0 (g/dL)   RDW 59.5  63.8 - 75.6 (%)   Platelets 274  150 - 400 (K/uL)  POCT I-STAT, CHEM 8      Component Value Range   Sodium 141  135 - 145 (mEq/L)   Potassium 2.9 (*) 3.5 - 5.1 (mEq/L)   Chloride 99  96 - 112 (mEq/L)   BUN 6  6 - 23 (mg/dL)   Creatinine, Ser 4.33  0.50 - 1.10 (mg/dL)   Glucose, Bld 295 (*) 70 - 99 (mg/dL)   Calcium, Ion 1.88  4.16 - 1.32 (mmol/L)   TCO2 28  0 - 100 (mmol/L)   Hemoglobin 10.9 (*) 12.0 - 15.0 (g/dL)   HCT 60.6 (*) 30.1 - 46.0 (%)   Dg Chest 2 View  10/05/2011  *RADIOLOGY REPORT*  Clinical Data: Shortness of breath.  History of aortic aneurysm. Recently hospitalized for hypertension.  Ex-smoker as of 6 weeks ago.  CHEST - 2 VIEW  Comparison: 09/19/2011  Findings: Degraded lateral view secondary patient positioning. Underlying hyperinflation.  Mild osteopenia.  Cardiomegaly with atherosclerosis in the transverse aorta.  A small left pleural effusion is decreased since 09/19/2011. No pneumothorax.  Biapical pleural thickening. No congestive failure.  Mild bibasilar volume loss.  IMPRESSION:  1.  Cardiomegaly and  hyperinflation, without congestive failure. 2.  Small left pleural effusion.  Original Report Authenticated By: Consuello Bossier, M.D.   Dg Chest 2 View  09/15/2011  *RADIOLOGY REPORT*  Clinical Data: Lies weakness  CHEST - 2 VIEW  Comparison: 08/04/2011  Findings: Mild cardiomegaly.  Left midlung calcified granuloma. There are ill-defined opacities at the left base worrisome for patchy airspace disease.  Lungs are otherwise clear.  Pulmonary vascularity is within normal limits.  IMPRESSION: Patchy opacities at the left base worrisome for airspace disease. Follow-up radiographs until resolution are recommended.  Original Report Authenticated By: Donavan Burnet, M.D.   Ct Head Wo Contrast  10/12/2011  *RADIOLOGY REPORT*  Clinical Data: Facial droop.  CT HEAD WITHOUT CONTRAST  Technique:  Contiguous axial images were obtained from the base of the skull through the vertex without contrast.  Comparison: CT of the head performed 09/19/2011  Findings: There is no evidence of acute infarction, mass lesion, or intra- or extra-axial hemorrhage on CT.  Prominence of the ventricles and sulci reflects mild to moderate cortical volume loss.  The degree of ventricular prominence appears grossly stable from multiple prior studies.  Periventricular and subcortical white matter change likely reflects small vessel ischemic microangiopathy.  The posterior fossa, including the cerebellum, brainstem and fourth ventricle, is within normal limits.  The basal ganglia are unremarkable in appearance.  The cerebral hemispheres demonstrate grossly normal gray-white differentiation.  No mass effect or midline shift is seen.  There is no evidence of fracture; visualized osseous structures are  unremarkable in appearance.  The visualized portions of the orbits are within normal limits.  The paranasal sinuses and mastoid air cells are well-aerated.  No significant soft tissue abnormalities are seen.  IMPRESSION:  1.  No acute intracranial  pathology seen on CT. 2.  Mild to moderate cortical volume loss and diffuse small vessel ischemic microangiopathy.  Original Report Authenticated By: Tonia Ghent, M.D.   Ct Head Wo Contrast  09/19/2011  *RADIOLOGY REPORT*  Clinical Data: Recent fall, altered mental status.  CT HEAD WITHOUT CONTRAST  Technique:  Contiguous axial images were obtained from the base of the skull through the vertex without contrast.  Comparison: 09/18/2011 MRI  Findings: Periventricular and subcortical white matter hypodensities are most in keeping with chronic microangiopathic change. There is no evidence for acute hemorrhage, hydrocephalus, mass lesion, or abnormal extra-axial fluid collection.  No definite CT evidence for acute infarction.  The visualized paranasal sinuses and mastoid air cells are predominately clear.  No displaced calvarial fracture.  Bilateral lens replacement.  IMPRESSION: No acute intracranial abnormality.  Unchanged white matter hypodensities.  Original Report Authenticated By: Waneta Martins, M.D.   Ct Head Wo Contrast  09/15/2011  *RADIOLOGY REPORT*  Clinical Data: Numbness.  Lightheadedness.  CT HEAD WITHOUT CONTRAST  Technique:  Contiguous axial images were obtained from the base of the skull through the vertex without contrast.  Comparison: None.  Findings: Global atrophy.  Chronic ischemic changes in the periventricular white matter.  No mass effect, midline shift, or acute intracranial hemorrhage.  Mastoid air cells are clear. Visualized paranasal sinuses are clear.  Cranium is intact.  IMPRESSION: No acute intracranial pathology.  Chronic changes.  Original Report Authenticated By: Donavan Burnet, M.D.   Mr Brain Wo Contrast  09/18/2011  *RADIOLOGY REPORT*  Clinical Data: 77 year old female with confusion, dysphasia, unsteady gait.  The patient reports an allergy to contrast (listed in the medical record is iodinated contrast) and declined gadolinium contrast for this exam.  MRI HEAD  WITHOUT CONTRAST  Technique:  Multiplanar, multiecho pulse sequences of the brain and surrounding structures were obtained according to standard protocol without intravenous contrast.  Comparison: Head CTs 09/15/2011 and earlier. Neck CT 05/13/2011.  Findings: No restricted diffusion to suggest acute infarction.  No midline shift, mass effect, evidence of mass lesion, ventriculomegaly, extra-axial collection or acute intracranial hemorrhage.  Cervicomedullary junction and pituitary are within normal limits.  Major intracranial vascular flow voids are preserved with a degree of intracranial artery dolichoectasia.  Scattered chronic micro hemorrhages, mostly in the frontal lobe subcortical white matter (series 7 image 12, 13).  Confluent periventricular and central cerebral white matter T2 and FLAIR hyperintensity.  Comparatively mild T2 hyperintensity in the pons. Deep gray matter nuclei and cerebellum within normal limits for age.  Negative visualized cervical spine. Visualized bone marrow signal is within normal limits.  Grossly normal visualized internal auditory structures.  Postoperative changes to the globes. Visualized paranasal sinuses and mastoids are clear.  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 This is stable since 05/13/2011.  IMPRESSION: 1. No acute intracranial abnormality.  Chronic small vessel disease. 2.  Right parotid gland mass is stable since 05/13/2011, but decreased T2 signal is noted and tumors with low signal intensity on T2-weighted images are more likely to be malignant even when they are well-demarcated.  Recommend ENT follow-up.  Original Report Authenticated By: Ulla Potash  III, M.D.   Nm Pulmonary Per & Vent  10/05/2011  *RADIOLOGY REPORT*  Clinical Data:  Shortness of breath, increased D-dimer, contrast allergy  NUCLEAR MEDICINE VENTILATION - PERFUSION LUNG SCAN  Technique:  Wash-in,  equilibrium, and wash-out phase ventilation images were obtained using Xe-133 gas.  Perfusion images were obtained in multiple projections after intravenous injection of Tc- 65m MAA.  Radiopharmaceuticals:  10 mCi Xe-133 gas and 6.0 mCi Tc-74m MAA.  Comparison:  None Correlation:  Chest radiograph 10/05/2011  Findings: Ventilation exam in anterior and posterior projections demonstrates a slightly smaller left lung than right. Minimal nonsegmental diminished ventilation at left lung base. No significant xenon retention.  Perfusion lung scan in eight projections shows slightly smaller left lung than right. Minimal nonsegmental diminished perfusion at left lung base. No segmental or subsegmental perfusion defects identified.  Chest radiograph is significant for cardiomegaly and small left pleural effusion.  IMPRESSION: Minimal nonsegmental diminished perfusion and ventilation at left lung base, likely related to small left pleural effusion. Findings represent a very low probability pulmonary embolism.  Original Report Authenticated By: Lollie Marrow, M.D.   Dg Chest Portable 1 View  09/20/2011  *RADIOLOGY REPORT*  Clinical Data: Pneumonia  PORTABLE CHEST - 1 VIEW  Comparison: 09/15/2011  Findings: A right internal jugular vein temporary pacemaker has been placed.  Tip projects over the right ventricle.  Heart is moderately enlarged.  Vascular congestion has developed.  Bibasilar haziness worrisome for developing airspace disease or edema is worse.  No pneumothorax.  IMPRESSION: Right internal jugular vein temporary pacemaker tip projects over the right ventricle and no pneumothorax  Increasing vascular congestion and bibasilar airspace disease worrisome for edema.  Original Report Authenticated By: Donavan Burnet, M.D.   CRITICAL CARE Performed by: Doug Sou  ?  Total critical care time: 30 minute Critical care time was exclusive of separately billable procedures and treating other patients.  Critical  care was necessary to treat or prevent imminent or life-threatening deterioration.  Critical care was time spent personally by me on the following activities: development of treatment plan with patient and/or surrogate as well as nursing, discussions with consultants, evaluation of patient's response to treatment, examination of patient, obtaining history from patient or surrogate, ordering and performing treatments and interventions, ordering and review of laboratory studies, ordering and review of radiographic studies, pulse oximetry and re-evaluation of patient's condition. Plan admit telemetry  Diagnosis #1 stroke #2 hypokalemia #3 anemia        Doug Sou, MD 10/12/11 0630

## 2011-10-12 NOTE — ED Notes (Signed)
Per EMS:  Pt had acute onset of facial twitching that woke her up out of her sleep.  EMS stated left sided facial droop and slurred speech denies weakness strong grips bilaterally and was able to ambulate independently

## 2011-10-13 ENCOUNTER — Encounter (HOSPITAL_COMMUNITY): Payer: Self-pay | Admitting: Cardiology

## 2011-10-13 DIAGNOSIS — IMO0002 Reserved for concepts with insufficient information to code with codable children: Secondary | ICD-10-CM | POA: Insufficient documentation

## 2011-10-13 DIAGNOSIS — I509 Heart failure, unspecified: Secondary | ICD-10-CM

## 2011-10-13 DIAGNOSIS — I442 Atrioventricular block, complete: Secondary | ICD-10-CM

## 2011-10-13 DIAGNOSIS — G5139 Clonic hemifacial spasm, unspecified: Secondary | ICD-10-CM

## 2011-10-13 DIAGNOSIS — Z91041 Radiographic dye allergy status: Secondary | ICD-10-CM | POA: Insufficient documentation

## 2011-10-13 DIAGNOSIS — R9430 Abnormal result of cardiovascular function study, unspecified: Secondary | ICD-10-CM | POA: Insufficient documentation

## 2011-10-13 LAB — GLUCOSE, CAPILLARY: Glucose-Capillary: 107 mg/dL — ABNORMAL HIGH (ref 70–99)

## 2011-10-13 LAB — LIPID PANEL
Cholesterol: 120 mg/dL (ref 0–200)
Triglycerides: 146 mg/dL (ref <150)
VLDL: 29 mg/dL (ref 0–40)

## 2011-10-13 MED ORDER — FUROSEMIDE 20 MG PO TABS
20.0000 mg | ORAL_TABLET | Freq: Every day | ORAL | Status: DC
  Administered 2011-10-13 – 2011-10-14 (×2): 20 mg via ORAL
  Filled 2011-10-13 (×2): qty 1

## 2011-10-13 NOTE — Progress Notes (Signed)
Subjective: Patient has had no further twitching of her face since yesterday. I've explained to the patient and her husband my concern about the EKG changes. Because the patient had the same EKG changes at time of discharge her last admission. Her 2-D echocardiogram was done prior to the changes in the EKG. Since her discharge the EKG changes have persisted until  this admission. I have reviewed the patient's hospital record from her last admission and at the time of discharge the plan is for the patient to followup with Dr. Johney Frame regarding the third degree heart block that she had in the hospital P. the appointment is scheduled for tomorrow in the morning. The patient denies any chest pain, she denies any nausea, she denies any jaw pain. On today the patient refused occupational therapy. She states that her experience with occupational therapy and physical therapy in her home with unpleasant in the regard that she felt that they rearranged her home in a way that she is not accustomed to and it made things more difficult for her within her home. At that she does not want to have physical therapy occupational therapy to her home in the future and does not feel that she warrants an evaluation here by them. This patient is awake and alert with good insight and cognition. Objective: Filed Vitals:   10/12/11 2200 10/13/11 0641 10/13/11 1009 10/13/11 1018  BP: 147/84 149/83 136/64 137/74  Pulse:  76 83 83  Temp: 98.3 F (36.8 C) 98.4 F (36.9 C)  99.2 F (37.3 C)  TempSrc: Oral Oral  Oral  Resp: 20 20  18   Height:      Weight:      SpO2: 91% 94% 97% 93%   Weight change:   Intake/Output Summary (Last 24 hours) at 10/13/11 1200 Last data filed at 10/12/11 2300  Gross per 24 hour  Intake    240 ml  Output    200 ml  Net     40 ml    General: Alert, awake, oriented x3, in no acute distress.  HEENT: [place]/AT [place]ER[place], EOMI Neck: Trachea midline,  no masses, no thyromegal,y no JVD, no carotid  bruit OROPHARYNX:  Moist, No exudate/ erythema/lesions.  Heart: Regular rate and rhythm, without murmurs, rubs, gallops, PMI non-displaced, no heaves or thrills on palpation.  Lungs: Clear to auscultation, no wheezing or rhonchi noted. No increased vocal fremitus resonant to percussion  Abdomen: Soft, nontender, nondistended, positive bowel sounds, no masses no hepatosplenomegaly noted..  Neuro: No focal neurological deficits noted cranial nerves II through XII grossly intact. DTRs 2+ bilaterally upper and lower extremities. Strength 5 out of 5 in bilateral upper and lower extremities. Musculoskeletal: No warm swelling or erythema around joints, no spinal tenderness noted. Psychiatric: Patient alert and oriented x3, good insight and cognition, good recent to remote recall. Lymph node survey: No cervical axillary or inguinal lymphadenopathy noted.     Lab Results:  Basename 10/12/11 2017 10/12/11 1933 10/12/11 0507  NA -- -- 141  K -- 3.5 2.9*  CL -- -- 99  CO2 -- -- --  GLUCOSE -- -- 117*  BUN -- -- 6  CREATININE 0.80 -- 0.90  CALCIUM -- -- --  MG -- 2.3 --  PHOS -- -- --   No results found for this basename: AST:2,ALT:2,ALKPHOS:2,BILITOT:2,PROT:2,ALBUMIN:2 in the last 72 hours No results found for this basename: LIPASE:2,AMYLASE:2 in the last 72 hours  Basename 10/12/11 2017 10/12/11 0507 10/12/11 0444  WBC 6.8 -- 7.3  NEUTROABS -- -- --  HGB 10.9* 10.9* --  HCT 32.8* 32.0* --  MCV 101.5* -- 101.9*  PLT 279 -- 274    Basename 10/12/11 1139  CKTOTAL 27  CKMB 2.0  CKMBINDEX --  TROPONINI <0.30   No results found for this basename: POCBNP:3 in the last 72 hours No results found for this basename: DDIMER:2 in the last 72 hours  Basename 10/12/11 1139  HGBA1C 5.9*    Basename 10/13/11 0600  CHOL 120  HDL 30*  LDLCALC 61  TRIG 161  CHOLHDL 4.0  LDLDIRECT --    Basename 10/12/11 1139  TSH 9.963*  T4TOTAL --  T3FREE --  THYROIDAB --   No results found for  this basename: VITAMINB12:2,FOLATE:2,FERRITIN:2,TIBC:2,IRON:2,RETICCTPCT:2 in the last 72 hours  Micro Results: No results found for this or any previous visit (from the past 240 hour(s)).  Studies/Results: Dg Chest 2 View  10/05/2011  *RADIOLOGY REPORT*  Clinical Data: Shortness of breath.  History of aortic aneurysm. Recently hospitalized for hypertension.  Ex-smoker as of 6 weeks ago.  CHEST - 2 VIEW  Comparison: 09/19/2011  Findings: Degraded lateral view secondary patient positioning. Underlying hyperinflation.  Mild osteopenia.  Cardiomegaly with atherosclerosis in the transverse aorta.  A small left pleural effusion is decreased since 09/19/2011. No pneumothorax.  Biapical pleural thickening. No congestive failure.  Mild bibasilar volume loss.  IMPRESSION:  1.  Cardiomegaly and hyperinflation, without congestive failure. 2.  Small left pleural effusion.  Original Report Authenticated By: Consuello Bossier, M.D.   Dg Chest 2 View  09/15/2011  *RADIOLOGY REPORT*  Clinical Data: Lies weakness  CHEST - 2 VIEW  Comparison: 08/04/2011  Findings: Mild cardiomegaly.  Left midlung calcified granuloma. There are ill-defined opacities at the left base worrisome for patchy airspace disease.  Lungs are otherwise clear.  Pulmonary vascularity is within normal limits.  IMPRESSION: Patchy opacities at the left base worrisome for airspace disease. Follow-up radiographs until resolution are recommended.  Original Report Authenticated By: Donavan Burnet, M.D.   Ct Head Wo Contrast  10/12/2011  *RADIOLOGY REPORT*  Clinical Data: Facial droop.  CT HEAD WITHOUT CONTRAST  Technique:  Contiguous axial images were obtained from the base of the skull through the vertex without contrast.  Comparison: CT of the head performed 09/19/2011  Findings: There is no evidence of acute infarction, mass lesion, or intra- or extra-axial hemorrhage on CT.  Prominence of the ventricles and sulci reflects mild to moderate cortical volume  loss.  The degree of ventricular prominence appears grossly stable from multiple prior studies.  Periventricular and subcortical white matter change likely reflects small vessel ischemic microangiopathy.  The posterior fossa, including the cerebellum, brainstem and fourth ventricle, is within normal limits.  The basal ganglia are unremarkable in appearance.  The cerebral hemispheres demonstrate grossly normal gray-white differentiation.  No mass effect or midline shift is seen.  There is no evidence of fracture; visualized osseous structures are unremarkable in appearance.  The visualized portions of the orbits are within normal limits.  The paranasal sinuses and mastoid air cells are well-aerated.  No significant soft tissue abnormalities are seen.  IMPRESSION:  1.  No acute intracranial pathology seen on CT. 2.  Mild to moderate cortical volume loss and diffuse small vessel ischemic microangiopathy.  Original Report Authenticated By: Tonia Ghent, M.D.   Ct Head Wo Contrast  09/19/2011  *RADIOLOGY REPORT*  Clinical Data: Recent fall, altered mental status.  CT HEAD WITHOUT CONTRAST  Technique:  Contiguous axial  images were obtained from the base of the skull through the vertex without contrast.  Comparison: 09/18/2011 MRI  Findings: Periventricular and subcortical white matter hypodensities are most in keeping with chronic microangiopathic change. There is no evidence for acute hemorrhage, hydrocephalus, mass lesion, or abnormal extra-axial fluid collection.  No definite CT evidence for acute infarction.  The visualized paranasal sinuses and mastoid air cells are predominately clear.  No displaced calvarial fracture.  Bilateral lens replacement.  IMPRESSION: No acute intracranial abnormality.  Unchanged white matter hypodensities.  Original Report Authenticated By: Waneta Martins, M.D.   Ct Head Wo Contrast  09/15/2011  *RADIOLOGY REPORT*  Clinical Data: Numbness.  Lightheadedness.  CT HEAD WITHOUT  CONTRAST  Technique:  Contiguous axial images were obtained from the base of the skull through the vertex without contrast.  Comparison: None.  Findings: Global atrophy.  Chronic ischemic changes in the periventricular white matter.  No mass effect, midline shift, or acute intracranial hemorrhage.  Mastoid air cells are clear. Visualized paranasal sinuses are clear.  Cranium is intact.  IMPRESSION: No acute intracranial pathology.  Chronic changes.  Original Report Authenticated By: Donavan Burnet, M.D.   Mri Brain Without Contrast  10/12/2011  *RADIOLOGY REPORT*  Clinical Data:  Bilateral facial twitching, left greater than right.  History of diabetes and hypertension.  MRI HEAD WITHOUT CONTRAST MRA HEAD WITHOUT CONTRAST  Technique:  Multiplanar, multiecho pulse sequences of the brain and surrounding structures were obtained without intravenous contrast. Angiographic images of the head were obtained using MRA technique without contrast.  Comparison:  CT head earlier in the day.  MRI brain 09/18/2011.  MRI HEAD  Findings:  There is no evidence for acute infarction, intracranial hemorrhage, mass lesion, hydrocephalus, or extra-axial fluid. There is moderate atrophy with chronic microvascular ischemic change. Dolichoectatic but otherwise widely patent intracranial vasculature.  Scattered stable bifrontal micro hemorrhage is likely secondary to hypertensive cerebrovascular disease.  Incompletely evaluated but persistent diminished T2 signal deep lobe parotid mass with coronal cross-sectional measurements of 17 x 13 mm.  This is statistically indeterminate for a benign or malignant lesion, but the low T2 signal can portend a more ominous process.  Recommend MRI of the neck without and with contrast.  IMPRESSION: No acute intracranial findings.  Persistent atrophy and small vessel disease.  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  Recommend further evaluation with MRI of the neck without and with contrast.   MRA HEAD  Findings: Widely patent but dolichoectatic carotid and basilar arteries.  Left greater than right vertebral arteries supply the basilar without visible stenosis.  The anterior, middle and posterior cerebral arteries are all widely patent. There is no visible intracranial aneurysm.  Mild irregularity of the distal MCA and PCA branches suggest intracranial atherosclerotic change secondary to hypertension or diabetes.  IMPRESSION: No proximal flow limiting stenosis.  Moderate dolichoectasia and mild distal irregularity.  Original Report Authenticated By: Elsie Stain, M.D.   Mr Brain Wo Contrast  09/18/2011  *RADIOLOGY REPORT*  Clinical Data: 77 year old female with confusion, dysphasia, unsteady gait.  The patient reports an allergy to contrast (listed in the medical record is iodinated contrast) and declined gadolinium contrast for this exam.  MRI HEAD WITHOUT CONTRAST  Technique:  Multiplanar, multiecho pulse sequences of the brain and surrounding structures were obtained according to standard protocol without intravenous contrast.  Comparison: Head CTs 09/15/2011 and earlier. Neck CT 05/13/2011.  Findings: No restricted diffusion to suggest acute infarction.  No midline shift, mass effect,  evidence of mass lesion, ventriculomegaly, extra-axial collection or acute intracranial hemorrhage.  Cervicomedullary junction and pituitary are within normal limits.  Major intracranial vascular flow voids are preserved with a degree of intracranial artery dolichoectasia.  Scattered chronic micro hemorrhages, mostly in the frontal lobe subcortical white matter (series 7 image 12, 13).  Confluent periventricular and central cerebral white matter T2 and FLAIR hyperintensity.  Comparatively mild T2 hyperintensity in the pons. Deep gray matter nuclei and cerebellum within normal limits for age.  Negative visualized cervical spine. Visualized bone marrow signal is within normal limits.  Grossly normal visualized  internal auditory structures.  Postoperative changes to the globes. Visualized paranasal sinuses and mastoids are clear.  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 This is stable since 05/13/2011.  IMPRESSION: 1. No acute intracranial abnormality.  Chronic small vessel disease. 2.  Right parotid gland mass is stable since 05/13/2011, but decreased T2 signal is noted and tumors with low signal intensity on T2-weighted images are more likely to be malignant even when they are well-demarcated.  Recommend ENT follow-up.  Original Report Authenticated By: Harley Hallmark, M.D.   Nm Pulmonary Per & Vent  10/05/2011  *RADIOLOGY REPORT*  Clinical Data:  Shortness of breath, increased D-dimer, contrast allergy  NUCLEAR MEDICINE VENTILATION - PERFUSION LUNG SCAN  Technique:  Wash-in, equilibrium, and wash-out phase ventilation images were obtained using Xe-133 gas.  Perfusion images were obtained in multiple projections after intravenous injection of Tc- 37m MAA.  Radiopharmaceuticals:  10 mCi Xe-133 gas and 6.0 mCi Tc-9m MAA.  Comparison:  None Correlation:  Chest radiograph 10/05/2011  Findings: Ventilation exam in anterior and posterior projections demonstrates a slightly smaller left lung than right. Minimal nonsegmental diminished ventilation at left lung base. No significant xenon retention.  Perfusion lung scan in eight projections shows slightly smaller left lung than right. Minimal nonsegmental diminished perfusion at left lung base. No segmental or subsegmental perfusion defects identified.  Chest radiograph is significant for cardiomegaly and small left pleural effusion.  IMPRESSION: Minimal nonsegmental diminished perfusion and ventilation at left lung base, likely related to small left pleural effusion. Findings represent a very low probability pulmonary embolism.  Original Report Authenticated By: Lollie Marrow, M.D.    Dg Chest Portable 1 View  09/20/2011  *RADIOLOGY REPORT*  Clinical Data: Pneumonia  PORTABLE CHEST - 1 VIEW  Comparison: 09/15/2011  Findings: A right internal jugular vein temporary pacemaker has been placed.  Tip projects over the right ventricle.  Heart is moderately enlarged.  Vascular congestion has developed.  Bibasilar haziness worrisome for developing airspace disease or edema is worse.  No pneumothorax.  IMPRESSION: Right internal jugular vein temporary pacemaker tip projects over the right ventricle and no pneumothorax  Increasing vascular congestion and bibasilar airspace disease worrisome for edema.  Original Report Authenticated By: Donavan Burnet, M.D.   Mr Mra Head/brain Wo Cm  10/12/2011  *RADIOLOGY REPORT*  Clinical Data:  Bilateral facial twitching, left greater than right.  History of diabetes and hypertension.  MRI HEAD WITHOUT CONTRAST MRA HEAD WITHOUT CONTRAST  Technique:  Multiplanar, multiecho pulse sequences of the brain and surrounding structures were obtained without intravenous contrast. Angiographic images of the head were obtained using MRA technique without contrast.  Comparison:  CT head earlier in the day.  MRI brain 09/18/2011.  MRI HEAD  Findings:  There is no evidence for acute  infarction, intracranial hemorrhage, mass lesion, hydrocephalus, or extra-axial fluid. There is moderate atrophy with chronic microvascular ischemic change. Dolichoectatic but otherwise widely patent intracranial vasculature.  Scattered stable bifrontal micro hemorrhage is likely secondary to hypertensive cerebrovascular disease.  Incompletely evaluated but persistent diminished T2 signal deep lobe parotid mass with coronal cross-sectional measurements of 17 x 13 mm.  This is statistically indeterminate for a benign or malignant lesion, but the low T2 signal can portend a more ominous process.  Recommend MRI of the neck without and with contrast.  IMPRESSION: No acute intracranial findings.   Persistent atrophy and small vessel disease.  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  Recommend further evaluation with MRI of the neck without and with contrast.  MRA HEAD  Findings: Widely patent but dolichoectatic carotid and basilar arteries.  Left greater than right vertebral arteries supply the basilar without visible stenosis.  The anterior, middle and posterior cerebral arteries are all widely patent. There is no visible intracranial aneurysm.  Mild irregularity of the distal MCA and PCA branches suggest intracranial atherosclerotic change secondary to hypertension or diabetes.  IMPRESSION: No proximal flow limiting stenosis.  Moderate dolichoectasia and mild distal irregularity.  Original Report Authenticated By: Elsie Stain, M.D.    Medications: I have reviewed the patient's current medications. Scheduled Meds:   . amitriptyline  10 mg Oral QHS  . aspirin  325 mg Oral Daily  . buPROPion  100 mg Oral Daily  . calcium carbonate  1 tablet Oral Daily  . cholecalciferol  1,000 Units Oral Daily  . enoxaparin (LOVENOX) injection  40 mg Subcutaneous QHS  . insulin aspart  0-15 Units Subcutaneous TID WC  . insulin aspart  0-5 Units Subcutaneous QHS  . levothyroxine  100 mcg Oral Daily  . lisinopril  5 mg Oral Daily  . nitrofurantoin (macrocrystal-monohydrate)  100 mg Oral BID  . potassium chloride  40 mEq Oral Q2H  . sodium chloride  3 mL Intravenous Q12H  . DISCONTD: calcium carbonate  600 mg Oral Daily  . DISCONTD: enoxaparin  30 mg Subcutaneous Q24H  . DISCONTD: enoxaparin (LOVENOX) injection  40 mg Subcutaneous Daily  . DISCONTD: Lutein  1 tablet Oral Daily   Continuous Infusions:   . sodium chloride 75 mL/hr at 10/12/11 1200   PRN Meds:.sodium chloride, acetaminophen, acetaminophen, ALPRAZolam, hydroxypropyl methylcellulose, ondansetron, sodium chloride, DISCONTD: acetaminophen, DISCONTD: Polyethyl Glycol-Propyl Glycol Assessment/Plan: Patient Active Hospital Problem  List:  1. Facial twitching and: [name] presents with twitching bilaterally in her face left greater than right which is a chronic problem that seems to have intensified on today. MRI is negative for  a stroke and symptoms are not consistent with TIA. However her symptoms rather seems to implicate pathology may be in the high cervical area or some type of mental tumor that may be causing compression on the nerves. The patient does have known parotid mass which is described at the parotid mass on the MRI. It is unclear as to its role in the patient's presenting symptoms. I discussed with the patient about having an MRI of that area to live at this point she like to defer to Dr. Pollyann Kennedy isn't following her parotid mass. This patient may however benefit from evaluation of the upper cervical area or facial x-ray to look for any mental masses that may be contributing to the fasciculations. At this time the patient does not want to pursue neurology consultation in the hospital.  2. Abnormal EKG: I have reviewed  the patient's EKGs from previous hospitalization. The patient had a normal EKG on 09/15/2011 but had subsequent abnormal EKG suture consistent with the findings of this. The patient has no signs or symptoms consistent with an acute coronary syndrome, however review of her 2-D echocardiogram done on 09/20/2011 showed that she had hypokinesis and an EF of 40%. In light of this I will repeat a 2-D echocardiogram since the last one was done before the EKG changes. Although the patient has an appointment tomorrow to followup with Dr. Johney Frame in the office, I have asked by cardiology to see her in consultation while here in the hospital. This patient may ultimately require a stress test however this may be able to be achieved as an outpatient since changes have been persistent for approximately one week and the patient has had no clinical symptoms.  3. Parotid mass followed by Dr. Pollyann Kennedy: This parotid mass may  actually be contributory to the facial twitching that this patient is having.  4. Diabetes type 2: The patient was managed with sliding scale insulin   5. Hypertension: Continue lisinopril and Lasix.   6. Hypokalemia: Will replete orally and rechecked after repletion.   7. Hypothyroidism: I will also check a TSH on the patient.   8. DVT prophylaxis: Continue Lovenox.   Patient's discharge will be contingent upon recommendations from cardiology.      No active hospital problems.   LOS: 1 day

## 2011-10-13 NOTE — Progress Notes (Signed)
OT Note OT Cancellation Note  ___Treatment cancelled today due to medical issues with patient which prohibited therapy  ___ Treatment cancelled today due to patient receiving procedure or test   _x__ Treatment cancelled today due to patient's refusal to participate - Pt adamantly refused eval this am despite max amt of encouragement from therapist. Pt appeared highly agitated & stated that her husband would A her when she returns home. Will check back another day.   ___ Treatment cancelled today due to   Signature:Fradel Baldonado, OTR/L [phone]

## 2011-10-13 NOTE — Consult Note (Signed)
CARDIOLOGY CONSULT NOTE  Patient ID: ZAKYA HALABI MRN: [id number] DOB/AGE: 1927-08-04 77 y.o.  Admit date: 10/12/2011  Primary Physician    Primary Cardiologist   Graciela Husbands and Myrtis Ser Reason for Consultation     NWG:NFAOZ R Norgren is a 77 y.o. female who was seen by cardiology in September 15, 2011. for syncope associated with high-grade heart block. She had a temporary pacer wire inserted but the cause was felt to be a vagal episode with prolonged P-P intervals and transient A-V dissociation. A reversible cause was suspected. The temporary wire was removed and a permanent pacemaker was not indicated. An echocardiogram at that time showed an EF of 40% with wall motion abnormalities. A 12-lead EKG at the time of her initial event did not show any significant ST changes.  The exact etiology of the bradycardia was never completely understood.  Her condition improved, and she was discharged on 09/24/2011.   She came back to the hospital on 10/06/2011 with shortness of breath and an increased BNP consistent with CHF exacerbation. EKG at that time didn't show diffuse ST-T wave changes.  Review of the EKGs actually showed that there was a tracing dated November 12 that showed these ST changes.  Also there had been an echo dated September 20, 2011 that showed an ejection fraction in the 40% range.  There was hypokinesis of the entire apex.  During the hospitalization of October 06, 2011, she was not felt to have had an acute cardiac event. She was discharged on 10/07/2011, but returned later on 10/07/2011 because of nausea and vomiting with diarrhea. She was treated symptomatically, and was discharged from the ER.  10/12/2011 she woke up with left-sided facial droop and slurred speech. She had facial twitching. As admitted by internal medicine and her EKG was felt to be abnormal in any way so cardiology was asked to evaluate her.Currently it is felt that her facial twitching does not represent a stroke.  There has been  ongoing question as to whether or not it could be related to her parodid mass.  The patient is not having any significant chest pain or shortness of breath.  She has not had any syncope or presyncope since her marked bradycardia on November 4,2012.   Past Medical History  Diagnosis Date  . Diabetes mellitus   . Thyroid disease   . Abdominal aneurysm   . COPD (chronic obstructive pulmonary disease)   . Hypertension   . Shortness of breath   . Hypothyroidism   . Depression   . Recurrent upper respiratory infection (URI)   . Seizures     Past Surgical History  Procedure Date  . Cholecystectomy   . Joint replacement   . Sinus surgery with instatrak   . Appendectomy   . Oophorectomy   . Arthroscopic repair acl     Allergies  Allergen Reactions  . Contrast Media (Iodinated Diagnostic Agents) Anaphylaxis  . Sulfa Antibiotics Anaphylaxis  . Iohexol      Desc: PT STATES SHE HAD A SEVERE REACTION TO IV CONRAST WITH THROAT SWELLING AND SOB. SHE WAS ADMITTED TO THE HOSPITAL. SHE HAS NEVER HAD CONTRAST AGAIN.   Marland Kitchen Penicillins Rash   I have reviewed the patient's current medications. Scheduled:   . amitriptyline  10 mg Oral QHS  . aspirin  325 mg Oral Daily  . buPROPion  100 mg Oral Daily  . calcium carbonate  1 tablet Oral Daily  . cholecalciferol  1,000 Units Oral Daily  . enoxaparin (  LOVENOX) injection  40 mg Subcutaneous QHS  . insulin aspart  0-15 Units Subcutaneous TID WC  . insulin aspart  0-5 Units Subcutaneous QHS  . levothyroxine  100 mcg Oral Daily  . lisinopril  5 mg Oral Daily  . nitrofurantoin (macrocrystal-monohydrate)  100 mg Oral BID  . potassium chloride  40 mEq Oral Q2H  . sodium chloride  3 mL Intravenous Q12H  . DISCONTD: calcium carbonate  600 mg Oral Daily  . DISCONTD: enoxaparin  30 mg Subcutaneous Q24H  . DISCONTD: enoxaparin (LOVENOX) injection  40 mg Subcutaneous Daily  . DISCONTD: Lutein  1 tablet Oral Daily    History   Social History  .  Marital Status: Married    Spouse Name: N/A    Number of Children: N/A  . Years of Education: N/A   Occupational History  . Not on file.   Social History Main Topics  . Smoking status: Former Smoker    Quit date: 08/31/2011  . Smokeless tobacco: Not on file  . Alcohol Use: No  . Drug Use: No  . Sexually Active:    Other Topics Concern  . Not on file   Social History Narrative  . No narrative on file    Family History  Problem Relation Age of Onset  . Cancer Father      Full 14 point review of systems complete and found to be negative unless listed  above  Physical Exam: Blood pressure 137/74, pulse 83, temperature 99.2 F (37.3 C), temperature source Oral, resp. rate 18, height 5\' 5"  (1.651 m), weight 155 lb (70.308 kg), SpO2 93.00%.   General: Well developed, well nourished, in no acute distress Head: Eyes PERRLA, No xanthomas.   Normocephalic and atraumatic, oropharynx without edema or exudate.  Lungs: Clear bilaterally to auscultation and percussion. Heart: HRRR S1 S2, without M/R/G.  Pulses are 2+ & equal all 4 extrem.   Neck:  No carotid bruit. No JVD. No lymphadenopathy Abdomen: Bowel sounds present, abdomen soft and non-tender without masses or hernias noted. Msk:  No spine or cva tenderness. Normal strength and tone for age, no joint deformities or effusions. Extremities: No clubbing, cyanosis or edema.  Neuro: Alert and oriented X 3. No focal deficits noted. Psych:  Good affect, responds appropriately Skin :  No rashes or lesions noted.  Labs:   Lab Results  Component Value Date   WBC 6.8 10/12/2011   HGB 10.9* 10/12/2011   HCT 32.8* 10/12/2011   MCV 101.5* 10/12/2011   PLT 279 10/12/2011    Lab 10/12/11 2017 10/12/11 1933 10/12/11 0507 10/07/11 0625 10/07/11 0056  NA -- -- 141 -- --  K -- 3.5 -- -- --  CL -- -- 99 -- --  CO2 -- -- -- 31 --  BUN -- -- 6 -- --  CREATININE 0.80 -- -- -- --  CALCIUM -- -- -- 9.7 --  PROT -- -- -- -- 6.6  BILITOT -- --  -- -- 0.7  ALKPHOS -- -- -- -- 71  ALT -- -- -- -- 15  AST -- -- -- -- 16  GLUCOSE -- -- 117* -- --    Basename 10/12/11 1139  CKTOTAL 27  CKMB 2.0  TROPONINI <0.30   Lab Results  Component Value Date   CHOL 120 10/13/2011   HDL 30* 10/13/2011   LDLCALC 61 10/13/2011   TRIG 146 10/13/2011   Lab Results  Component Value Date   DDIMER 0.80* 10/05/2011  Lipase  Date/Time Value Range Status  10/07/2011 12:56 AM 37  11-59 (U/L) Final   WBC  Date/Time Value Range Status  10/12/2011  8:17 PM 6.8  4.0-10.5 (K/uL) Final     Neutro Abs  Date/Time Value Range Status  09/20/2011  9:05 AM 4.6  1.7-7.7 (K/uL) Final     Hemoglobin  Date/Time Value Range Status  10/12/2011  8:17 PM 10.9* 12.0-15.0 (g/dL) Final     HCT  Date/Time Value Range Status  10/12/2011  8:17 PM 32.8* 36.0-46.0 (%) Final     MCV  Date/Time Value Range Status  10/12/2011  8:17 PM 101.5* 78.0-100.0 (fL) Final     Platelets  Date/Time Value Range Status  10/12/2011  8:17 PM 279  150-400 (K/uL) Final   Total CK  Date/Time Value Range Status  10/12/2011 11:39 AM 27  7-177 (U/L) Final  10/06/2011 11:55 PM 30  7-177 (U/L) Final  10/06/2011  4:24 PM 36  7-177 (U/L) Final     CK, MB  Date/Time Value Range Status  10/12/2011 11:39 AM 2.0  0.3-4.0 (ng/mL) Final  10/06/2011 11:55 PM 2.7  0.3-4.0 (ng/mL) Final  10/06/2011  4:24 PM 2.9  0.3-4.0 (ng/mL) Final     Troponin I  Date/Time Value Range Status  10/12/2011 11:39 AM <0.30  <0.30 (ng/mL) Final                Due to the release kinetics of cTnI,     a negative result within the first hours     of the onset of symptoms does not rule out     myocardial infarction with certainty.     If myocardial infarction is still suspected,     repeat the test at appropriate intervals.  10/07/2011 11:49 PM <0.30  <0.30 (ng/mL) Final                Due to the release kinetics of cTnI,     a negative result within the first hours     of the onset of symptoms  does not rule out     myocardial infarction with certainty.     If myocardial infarction is still suspected,     repeat the test at appropriate intervals.  10/06/2011 11:55 PM <0.30  <0.30 (ng/mL) Final                Due to the release kinetics of cTnI,     a negative result within the first hours     of the onset of symptoms does not rule out     myocardial infarction with certainty.     If myocardial infarction is still suspected,     repeat the test at appropriate intervals.    TSH  Date/Time Value Range Status  10/12/2011 11:39 AM 9.963* 0.350-4.500 (uIU/mL) Final   No results found for this basename: VitaminB12, Folate, Ferritin, TIBC, Iron, reticctpct    Echo:  11-9--2012 Study Conclusions Left ventricle: There is moderate hypokinesis of the entire apex. The base and mid LV move well. The estimated ejection fraction was 45%. Doppler parameters are consistent with abnormal left ventricular relaxation (grade 1 diastolic dysfunction).   Radiology:Ct Head Wo Contrast 10/12/2011  *RADIOLOGY REPORT*  Clinical Data: Facial droop.  CT HEAD WITHOUT CONTRAST  Technique:  Contiguous axial images were obtained from the base of the skull through the vertex without contrast.  Comparison: CT of the head performed 09/19/2011  Findings: There is no evidence of acute infarction, mass  lesion, or intra- or extra-axial hemorrhage on CT.  Prominence of the ventricles and sulci reflects mild to moderate cortical volume loss.  The degree of ventricular prominence appears grossly stable from multiple prior studies.  Periventricular and subcortical white matter change likely reflects small vessel ischemic microangiopathy.  The posterior fossa, including the cerebellum, brainstem and fourth ventricle, is within normal limits.  The basal ganglia are unremarkable in appearance.  The cerebral hemispheres demonstrate grossly normal gray-white differentiation.  No mass effect or midline shift is seen.  There is no  evidence of fracture; visualized osseous structures are unremarkable in appearance.  The visualized portions of the orbits are within normal limits.  The paranasal sinuses and mastoid air cells are well-aerated.  No significant soft tissue abnormalities are seen.  IMPRESSION:  1.  No acute intracranial pathology seen on CT. 2.  Mild to moderate cortical volume loss and diffuse small vessel ischemic microangiopathy.  Original Report Authenticated By: Tonia Ghent, M.D.   Mri Brain Without Contrast 10/12/2011  *RADIOLOGY REPORT*  Clinical Data:  Bilateral facial twitching, left greater than right.  History of diabetes and hypertension.  MRI HEAD WITHOUT CONTRAST MRA HEAD WITHOUT CONTRAST  Technique:  Multiplanar, multiecho pulse sequences of the brain and surrounding structures were obtained without intravenous contrast. Angiographic images of the head were obtained using MRA technique without contrast.  Comparison:  CT head earlier in the day.  MRI brain 09/18/2011.  MRI HEAD  Findings:  There is no evidence for acute infarction, intracranial hemorrhage, mass lesion, hydrocephalus, or extra-axial fluid. There is moderate atrophy with chronic microvascular ischemic change. Dolichoectatic but otherwise widely patent intracranial vasculature.  Scattered stable bifrontal micro hemorrhage is likely secondary to hypertensive cerebrovascular disease.  Incompletely evaluated but persistent diminished T2 signal deep lobe parotid mass with coronal cross-sectional measurements of 17 x 13 mm.  This is statistically indeterminate for a benign or malignant lesion, but the low T2 signal can portend a more ominous process.  Recommend MRI of the neck without and with contrast.  IMPRESSION: No acute intracranial findings.  Persistent atrophy and small vessel disease.  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  Recommend further evaluation with MRI of the neck without and with contrast.  MRA HEAD  Findings: Widely patent but  dolichoectatic carotid and basilar arteries.  Left greater than right vertebral arteries supply the basilar without visible stenosis.  The anterior, middle and posterior cerebral arteries are all widely patent. There is no visible intracranial aneurysm.  Mild irregularity of the distal MCA and PCA branches suggest intracranial atherosclerotic change secondary to hypertension or diabetes.  IMPRESSION: No proximal flow limiting stenosis.  Moderate dolichoectasia and mild distal irregularity.  Original Report Authenticated By: Elsie Stain, M.D.   Mr Mra Head/brain Wo Cm 10/12/2011  *RADIOLOGY REPORT*  Clinical Data:  Bilateral facial twitching, left greater than right.  History of diabetes and hypertension.  MRI HEAD WITHOUT CONTRAST MRA HEAD WITHOUT CONTRAST  Technique:  Multiplanar, multiecho pulse sequences of the brain and surrounding structures were obtained without intravenous contrast. Angiographic images of the head were obtained using MRA technique without contrast.  Comparison:  CT head earlier in the day.  MRI brain 09/18/2011.  MRI HEAD  Findings:  There is no evidence for acute infarction, intracranial hemorrhage, mass lesion, hydrocephalus, or extra-axial fluid. There is moderate atrophy with chronic microvascular ischemic change. Dolichoectatic but otherwise widely patent intracranial vasculature.  Scattered stable bifrontal micro hemorrhage is likely secondary to hypertensive cerebrovascular  disease.  Incompletely evaluated but persistent diminished T2 signal deep lobe parotid mass with coronal cross-sectional measurements of 17 x 13 mm.  This is statistically indeterminate for a benign or malignant lesion, but the low T2 signal can portend a more ominous process.  Recommend MRI of the neck without and with contrast.  IMPRESSION: No acute intracranial findings.  Persistent atrophy and small vessel disease.  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  Recommend further evaluation with MRI of  the neck without and with contrast.  MRA HEAD  Findings: Widely patent but dolichoectatic carotid and basilar arteries.  Left greater than right vertebral arteries supply the basilar without visible stenosis.  The anterior, middle and posterior cerebral arteries are all widely patent. There is no visible intracranial aneurysm.  Mild irregularity of the distal MCA and PCA branches suggest intracranial atherosclerotic change secondary to hypertension or diabetes.  IMPRESSION: No proximal flow limiting stenosis.  Moderate dolichoectasia and mild distal irregularity.  Original Report Authenticated By: Elsie Stain, M.D.    EKG:  10-12-2011 SINUS RHYTHM ~ normal P axis, V-rate 50- 99 FIRST DEGREE AV BLOCK ~ PR >210, V-rate 50- 90 ABNORMAL T, CONSIDER ISCHEMIA, ANT-LAT LEADS ~ T <-0.38mV, I aVL V2-V6 less T wave inversion inferiorly than previous ECG  10-07-2011 SINUS TACHYCARDIA ~ V-rate> 99 FIRST DEGREE AV BLOCK ~ PR >215, V-rate 91-120 RIGHT ATRIAL ABNORMALITY ~ P>0.41mV 2 lds or<-0.18mV aVR/aVL ABNORMAL T, PROBABLE ISCHEMIA, WIDESPREAD ~ T <-0.10mV, ant/lat/inf  ASSESSMENT AND PLAN:    Active Problems:   Complete heart block    On September 15, 2011 in the hospital the patient had high degree AV block.  There was also marked bradycardia.  It was felt that this was some type of vagal reaction.  The etiology was never quite clear.    Sinus bradycardia   Congestive heart failure       The patient had an episode of congestive heart failure before the current admission.  This was probably related to her ejection fraction of 40% and volume issues.  Her volume status is stable at this time.   Ejection fraction     The echo done September 20, 2011 revealed an ejection fraction of 40% and severe hypokinesis of the apex.   Abnormal EKG      It has been described in this note that the timing of the EKG abnormalities is very difficult to explain.  However by November 12 the patient did have diffuse  T-wave changes.  He is persist.  She is not having chest pain or shortness of breath at this time.  A followup 2-D echo has been ordered for today.  In addition I will proceed with ordering a stress nuclear scan for tomorrow.  If she has evidence of high grade ischemia heart catheterization will be recommended.  If there is no marked ischemia I will follow her cardiac status as an outpatient.   Contrast media allergy     The patient and her husband talk about this allergy that I believe she has received contrast with no difficulties.   Facial twitching     Etiology of this is not yet clear.  This is being assessed.  This is not a cardiac abnormality.

## 2011-10-14 ENCOUNTER — Inpatient Hospital Stay (HOSPITAL_COMMUNITY): Payer: Medicare Other

## 2011-10-14 ENCOUNTER — Encounter: Payer: Medicare Other | Admitting: Physician Assistant

## 2011-10-14 DIAGNOSIS — I517 Cardiomegaly: Secondary | ICD-10-CM

## 2011-10-14 DIAGNOSIS — R079 Chest pain, unspecified: Secondary | ICD-10-CM

## 2011-10-14 DIAGNOSIS — R9431 Abnormal electrocardiogram [ECG] [EKG]: Secondary | ICD-10-CM

## 2011-10-14 DIAGNOSIS — I6350 Cerebral infarction due to unspecified occlusion or stenosis of unspecified cerebral artery: Secondary | ICD-10-CM

## 2011-10-14 HISTORY — PX: US ECHOCARDIOGRAPHY: HXRAD669

## 2011-10-14 LAB — DIFFERENTIAL
Basophils Relative: 1 % (ref 0–1)
Lymphocytes Relative: 22 % (ref 12–46)
Monocytes Relative: 7 % (ref 3–12)
Neutro Abs: 6 10*3/uL (ref 1.7–7.7)
Neutrophils Relative %: 66 % (ref 43–77)

## 2011-10-14 LAB — CBC
Hemoglobin: 11.8 g/dL — ABNORMAL LOW (ref 12.0–15.0)
MCHC: 33.4 g/dL (ref 30.0–36.0)
RBC: 3.49 MIL/uL — ABNORMAL LOW (ref 3.87–5.11)
WBC: 9 10*3/uL (ref 4.0–10.5)

## 2011-10-14 LAB — BASIC METABOLIC PANEL
Chloride: 101 mEq/L (ref 96–112)
GFR calc Af Amer: 89 mL/min — ABNORMAL LOW (ref >90)
Potassium: 4.2 mEq/L (ref 3.5–5.1)
Sodium: 138 mEq/L (ref 135–145)

## 2011-10-14 LAB — CARDIAC PANEL(CRET KIN+CKTOT+MB+TROPI)
CK, MB: 2.5 ng/mL (ref 0.3–4.0)
Troponin I: <0.3 ng/mL (ref <0.30)

## 2011-10-14 LAB — GLUCOSE, CAPILLARY: Glucose-Capillary: 112 mg/dL — ABNORMAL HIGH (ref 70–99)

## 2011-10-14 LAB — MAGNESIUM: Magnesium: 2.2 mg/dL (ref 1.5–2.5)

## 2011-10-14 MED ORDER — ONDANSETRON HCL 4 MG/2ML IJ SOLN
INTRAMUSCULAR | Status: AC
  Administered 2011-10-14: 4 mg
  Filled 2011-10-14: qty 2

## 2011-10-14 MED ORDER — FUROSEMIDE 20 MG PO TABS: 20.0000 mg | ORAL_TABLET | Freq: Every day | ORAL | Status: DC

## 2011-10-14 MED ORDER — ASPIRIN 325 MG PO TABS: 325.0000 mg | ORAL_TABLET | Freq: Every day | ORAL | Status: DC

## 2011-10-14 MED ORDER — REGADENOSON 0.4 MG/5ML IV SOLN
0.4000 mg | Freq: Once | INTRAVENOUS | Status: AC
  Administered 2011-10-14: 0.4 mg via INTRAVENOUS
  Filled 2011-10-14: qty 5

## 2011-10-14 MED ORDER — TECHNETIUM TC 99M TETROFOSMIN IV KIT
30.0000 | PACK | Freq: Once | INTRAVENOUS | Status: AC | PRN
  Administered 2011-10-14: 30 via INTRAVENOUS

## 2011-10-14 MED ORDER — LEVOTHYROXINE SODIUM 112 MCG PO TABS: 112.0000 ug | ORAL_TABLET | Freq: Every day | ORAL | Status: DC

## 2011-10-14 MED ORDER — ACETAMINOPHEN 500 MG PO TABS: 500.0000 mg | ORAL_TABLET | Freq: Four times a day (QID) | ORAL | Status: DC | PRN

## 2011-10-14 MED ORDER — ACETAMINOPHEN 325 MG PO TABS
ORAL_TABLET | ORAL | Status: AC
  Filled 2011-10-14: qty 2

## 2011-10-14 MED ORDER — TECHNETIUM TC 99M TETROFOSMIN IV KIT
10.0000 | PACK | Freq: Once | INTRAVENOUS | Status: AC | PRN
  Administered 2011-10-14: 10 via INTRAVENOUS

## 2011-10-14 NOTE — Progress Notes (Signed)
PT Cancellation Note  ___Treatment cancelled today due to medical issues with patient which prohibited therapy  ___ Treatment cancelled today due to patient receiving procedure or test   __x_ Treatment cancelled today due to patient's refusal to participate.  Patient reports very fatigued from morning tests.    ___ Treatment cancelled today due to   Signature: Eleftheria Taborn B. Lisaann Atha, PT, DPT [phone]

## 2011-10-14 NOTE — Discharge Summary (Signed)
Paula [name] MRN: [id number] DOB/AGE: July 26, 1927 77 y.o.  Admit date: 10/12/2011 Discharge date: 10/14/2011  Primary Care Physician:  Georgianne Fick, MD, MD   Discharge Diagnoses:   Patient Active Problem List  Diagnoses  . COPD (chronic obstructive pulmonary disease) with emphysema  . Diabetes mellitus type II, controlled  . Aneurysm of abdominal aorta  . Pneumonia, organism unspecified  . UTI (urinary tract infection)  . Episode of dizziness  . Parotid mass  . Hypothyroidism  . Complete heart block  . Sinus bradycardia  . Hypokalemia  . Delirium  . Suspected CHF (congestive heart failure)  . Anxiety state, unspecified  . Congestive heart failure  . Ejection fraction  . Abnormal EKG  . Contrast media allergy  . Facial twitching    DISCHARGE MEDICATION: Current Discharge Medication List    CONTINUE these medications which have NOT CHANGED   Details  ALPRAZolam (XANAX) 0.25 MG tablet Take 0.25 mg by mouth at bedtime as needed. TAKES 1 TAB IN THE MORNING IN 2 TABS IN THE EVENING    amitriptyline (ELAVIL) 10 MG tablet Take 10 mg by mouth at bedtime.      buPROPion (WELLBUTRIN) 100 MG tablet Take 100 mg by mouth daily.     calcium carbonate (OS-CAL) 600 MG TABS Take 600 mg by mouth daily.      cholecalciferol (VITAMIN D) 1000 UNITS tablet Take 1,000 Units by mouth daily.      levothyroxine (SYNTHROID, LEVOTHROID) 100 MCG tablet Take 100 mcg by mouth daily.      lisinopril (PRINIVIL,ZESTRIL) 5 MG tablet Take 1 tablet (5 mg total) by mouth daily. Qty: 30 tablet, Refills: 0    Lutein 10 MG TABS Take 1 tablet by mouth daily. Hold while in hospital    nitrofurantoin, macrocrystal-monohydrate, (MACROBID) 100 MG capsule Take 1 capsule (100 mg total) by mouth 2 (two) times daily. Qty: 10 capsule, Refills: 0    ondansetron (ZOFRAN ODT) 8 MG disintegrating tablet Take 1 tablet (8 mg total) by mouth every 8 (eight) hours as needed for nausea. Qty: 20 tablet, Refills: 0      Polyethyl Glycol-Propyl Glycol (SYSTANE OP) Apply 1-2 drops to eye every 8 (eight) hours as needed. Dry eye       STOP taking these medications     acetaminophen (TYLENOL) 500 MG tablet      furosemide (LASIX) 20 MG tablet            Consults: Treatment Team:  Luis Abed, MD   SIGNIFICANT DIAGNOSTIC STUDIES:  Dg Chest 2 View  10/05/2011  *RADIOLOGY REPORT*  Clinical Data: Shortness of breath.  History of aortic aneurysm. Recently hospitalized for hypertension.  Ex-smoker as of 6 weeks ago.  CHEST - 2 VIEW  Comparison: 09/19/2011  Findings: Degraded lateral view secondary patient positioning. Underlying hyperinflation.  Mild osteopenia.  Cardiomegaly with atherosclerosis in the transverse aorta.  A small left pleural effusion is decreased since 09/19/2011. No pneumothorax.  Biapical pleural thickening. No congestive failure.  Mild bibasilar volume loss.  IMPRESSION:  1.  Cardiomegaly and hyperinflation, without congestive failure. 2.  Small left pleural effusion.  Original Report Authenticated By: Consuello Bossier, M.D.   Dg Chest 2 View  09/15/2011  *RADIOLOGY REPORT*  Clinical Data: Lies weakness  CHEST - 2 VIEW  Comparison: 08/04/2011  Findings: Mild cardiomegaly.  Left midlung calcified granuloma. There are ill-defined opacities at the left base worrisome for patchy airspace disease.  Lungs are otherwise clear.  Pulmonary vascularity is  within normal limits.  IMPRESSION: Patchy opacities at the left base worrisome for airspace disease. Follow-up radiographs until resolution are recommended.  Original Report Authenticated By: Donavan Burnet, M.D.   Ct Head Wo Contrast  10/12/2011  *RADIOLOGY REPORT*  Clinical Data: Facial droop.  CT HEAD WITHOUT CONTRAST  Technique:  Contiguous axial images were obtained from the base of the skull through the vertex without contrast.  Comparison: CT of the head performed 09/19/2011  Findings: There is no evidence of acute infarction, mass lesion,  or intra- or extra-axial hemorrhage on CT.  Prominence of the ventricles and sulci reflects mild to moderate cortical volume loss.  The degree of ventricular prominence appears grossly stable from multiple prior studies.  Periventricular and subcortical white matter change likely reflects small vessel ischemic microangiopathy.  The posterior fossa, including the cerebellum, brainstem and fourth ventricle, is within normal limits.  The basal ganglia are unremarkable in appearance.  The cerebral hemispheres demonstrate grossly normal gray-white differentiation.  No mass effect or midline shift is seen.  There is no evidence of fracture; visualized osseous structures are unremarkable in appearance.  The visualized portions of the orbits are within normal limits.  The paranasal sinuses and mastoid air cells are well-aerated.  No significant soft tissue abnormalities are seen.  IMPRESSION:  1.  No acute intracranial pathology seen on CT. 2.  Mild to moderate cortical volume loss and diffuse small vessel ischemic microangiopathy.  Original Report Authenticated By: Tonia Ghent, M.D.   Ct Head Wo Contrast  09/19/2011  *RADIOLOGY REPORT*  Clinical Data: Recent fall, altered mental status.  CT HEAD WITHOUT CONTRAST  Technique:  Contiguous axial images were obtained from the base of the skull through the vertex without contrast.  Comparison: 09/18/2011 MRI  Findings: Periventricular and subcortical white matter hypodensities are most in keeping with chronic microangiopathic change. There is no evidence for acute hemorrhage, hydrocephalus, mass lesion, or abnormal extra-axial fluid collection.  No definite CT evidence for acute infarction.  The visualized paranasal sinuses and mastoid air cells are predominately clear.  No displaced calvarial fracture.  Bilateral lens replacement.  IMPRESSION: No acute intracranial abnormality.  Unchanged white matter hypodensities.  Original Report Authenticated By: Waneta Martins,  M.D.   Ct Head Wo Contrast  09/15/2011  *RADIOLOGY REPORT*  Clinical Data: Numbness.  Lightheadedness.  CT HEAD WITHOUT CONTRAST  Technique:  Contiguous axial images were obtained from the base of the skull through the vertex without contrast.  Comparison: None.  Findings: Global atrophy.  Chronic ischemic changes in the periventricular white matter.  No mass effect, midline shift, or acute intracranial hemorrhage.  Mastoid air cells are clear. Visualized paranasal sinuses are clear.  Cranium is intact.  IMPRESSION: No acute intracranial pathology.  Chronic changes.  Original Report Authenticated By: Donavan Burnet, M.D.   Mri Brain Without Contrast  10/12/2011  *RADIOLOGY REPORT*  Clinical Data:  Bilateral facial twitching, left greater than right.  History of diabetes and hypertension.  MRI HEAD WITHOUT CONTRAST MRA HEAD WITHOUT CONTRAST  Technique:  Multiplanar, multiecho pulse sequences of the brain and surrounding structures were obtained without intravenous contrast. Angiographic images of the head were obtained using MRA technique without contrast.  Comparison:  CT head earlier in the day.  MRI brain 09/18/2011.  MRI HEAD  Findings:  There is no evidence for acute infarction, intracranial hemorrhage, mass lesion, hydrocephalus, or extra-axial fluid. There is moderate atrophy with chronic microvascular ischemic change. Dolichoectatic but otherwise widely patent intracranial vasculature.  Scattered stable bifrontal micro hemorrhage is likely secondary to hypertensive cerebrovascular disease.  Incompletely evaluated but persistent diminished T2 signal deep lobe parotid mass with coronal cross-sectional measurements of 17 x 13 mm.  This is statistically indeterminate for a benign or malignant lesion, but the low T2 signal can portend a more ominous process.  Recommend MRI of the neck without and with contrast.  IMPRESSION: No acute intracranial findings.  Persistent atrophy and small vessel disease.   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  Recommend further evaluation with MRI of the neck without and with contrast.  MRA HEAD  Findings: Widely patent but dolichoectatic carotid and basilar arteries.  Left greater than right vertebral arteries supply the basilar without visible stenosis.  The anterior, middle and posterior cerebral arteries are all widely patent. There is no visible intracranial aneurysm.  Mild irregularity of the distal MCA and PCA branches suggest intracranial atherosclerotic change secondary to hypertension or diabetes.  IMPRESSION: No proximal flow limiting stenosis.  Moderate dolichoectasia and mild distal irregularity.  Original Report Authenticated By: Elsie Stain, M.D.   Mr Brain Wo Contrast  09/18/2011  *RADIOLOGY REPORT*  Clinical Data: 77 year old female with confusion, dysphasia, unsteady gait.  The patient reports an allergy to contrast (listed in the medical record is iodinated contrast) and declined gadolinium contrast for this exam.  MRI HEAD WITHOUT CONTRAST  Technique:  Multiplanar, multiecho pulse sequences of the brain and surrounding structures were obtained according to standard protocol without intravenous contrast.  Comparison: Head CTs 09/15/2011 and earlier. Neck CT 05/13/2011.  Findings: No restricted diffusion to suggest acute infarction.  No midline shift, mass effect, evidence of mass lesion, ventriculomegaly, extra-axial collection or acute intracranial hemorrhage.  Cervicomedullary junction and pituitary are within normal limits.  Major intracranial vascular flow voids are preserved with a degree of intracranial artery dolichoectasia.  Scattered chronic micro hemorrhages, mostly in the frontal lobe subcortical white matter (series 7 image 12, 13).  Confluent periventricular and central cerebral white matter T2 and FLAIR hyperintensity.  Comparatively mild T2 hyperintensity in the pons. Deep gray matter nuclei and cerebellum within normal limits for  age.  Negative visualized cervical spine. Visualized bone marrow signal is within normal limits.  Grossly normal visualized internal auditory structures.  Postoperative changes to the globes. Visualized paranasal sinuses and mastoids are clear.  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 This is stable since 05/13/2011.  IMPRESSION: 1. No acute intracranial abnormality.  Chronic small vessel disease. 2.  Right parotid gland mass is stable since 05/13/2011, but decreased T2 signal is noted and tumors with low signal intensity on T2-weighted images are more likely to be malignant even when they are well-demarcated.  Recommend ENT follow-up.  Original Report Authenticated By: Harley Hallmark, M.D.   Nm Pulmonary Per & Vent  10/05/2011  *RADIOLOGY REPORT*  Clinical Data:  Shortness of breath, increased D-dimer, contrast allergy  NUCLEAR MEDICINE VENTILATION - PERFUSION LUNG SCAN  Technique:  Wash-in, equilibrium, and wash-out phase ventilation images were obtained using Xe-133 gas.  Perfusion images were obtained in multiple projections after intravenous injection of Tc- 67m MAA.  Radiopharmaceuticals:  10 mCi Xe-133 gas and 6.0 mCi Tc-96m MAA.  Comparison:  None Correlation:  Chest radiograph 10/05/2011  Findings: Ventilation exam in anterior and posterior projections demonstrates a slightly smaller left lung than right. Minimal nonsegmental diminished ventilation at left  lung base. No significant xenon retention.  Perfusion lung scan in eight projections shows slightly smaller left lung than right. Minimal nonsegmental diminished perfusion at left lung base. No segmental or subsegmental perfusion defects identified.  Chest radiograph is significant for cardiomegaly and small left pleural effusion.  IMPRESSION: Minimal nonsegmental diminished perfusion and ventilation at left lung base, likely related to small left pleural  effusion. Findings represent a very low probability pulmonary embolism.  Original Report Authenticated By: Lollie Marrow, M.D.   Dg Chest Portable 1 View  09/20/2011  *RADIOLOGY REPORT*  Clinical Data: Pneumonia  PORTABLE CHEST - 1 VIEW  Comparison: 09/15/2011  Findings: A right internal jugular vein temporary pacemaker has been placed.  Tip projects over the right ventricle.  Heart is moderately enlarged.  Vascular congestion has developed.  Bibasilar haziness worrisome for developing airspace disease or edema is worse.  No pneumothorax.  IMPRESSION: Right internal jugular vein temporary pacemaker tip projects over the right ventricle and no pneumothorax  Increasing vascular congestion and bibasilar airspace disease worrisome for edema.  Original Report Authenticated By: Donavan Burnet, M.D.   Mr Mra Head/brain Wo Cm  10/12/2011  *RADIOLOGY REPORT*  Clinical Data:  Bilateral facial twitching, left greater than right.  History of diabetes and hypertension.  MRI HEAD WITHOUT CONTRAST MRA HEAD WITHOUT CONTRAST  Technique:  Multiplanar, multiecho pulse sequences of the brain and surrounding structures were obtained without intravenous contrast. Angiographic images of the head were obtained using MRA technique without contrast.  Comparison:  CT head earlier in the day.  MRI brain 09/18/2011.  MRI HEAD  Findings:  There is no evidence for acute infarction, intracranial hemorrhage, mass lesion, hydrocephalus, or extra-axial fluid. There is moderate atrophy with chronic microvascular ischemic change. Dolichoectatic but otherwise widely patent intracranial vasculature.  Scattered stable bifrontal micro hemorrhage is likely secondary to hypertensive cerebrovascular disease.  Incompletely evaluated but persistent diminished T2 signal deep lobe parotid mass with coronal cross-sectional measurements of 17 x 13 mm.  This is statistically indeterminate for a benign or malignant lesion, but the low T2 signal can portend a  more ominous process.  Recommend MRI of the neck without and with contrast.  IMPRESSION: No acute intracranial findings.  Persistent atrophy and small vessel disease.  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  Recommend further evaluation with MRI of the neck without and with contrast.  MRA HEAD  Findings: Widely patent but dolichoectatic carotid and basilar arteries.  Left greater than right vertebral arteries supply the basilar without visible stenosis.  The anterior, middle and posterior cerebral arteries are all widely patent. There is no visible intracranial aneurysm.  Mild irregularity of the distal MCA and PCA branches suggest intracranial atherosclerotic change secondary to hypertension or diabetes.  IMPRESSION: No proximal flow limiting stenosis.  Moderate dolichoectasia and mild distal irregularity.  Original Report Authenticated By: Elsie Stain, M.D.     ECHO:   Left ventricle: The cavity size was normal. Wall thickness was increased in a pattern of moderate LVH. Systolic function was normal. The estimated ejection fraction was in the range of 55% to 60%. Wall motion was normal; there were no regional wall motion abnormalities. Doppler parameters are consistent with abnormal left ventricular relaxation (grade 1 diastolic dysfunction   Lexiscan Myoview: Small fixed defect in the distal anteroseptal wall which becomes slightly more prominent on stress. This is probably related to breast attenuation with slight differential breast positioning between the rest and stress images, but a small  scar with subtle peri infarct ischemia cannot be completely excluded.  Grossly normal left ventricular cavity size with normal left ventricular ejection fraction.     No results found for this or any previous visit (from the past 240 hour(s)).  BRIEF ADMITTING H & P: Ms. Axsomis an 77 year old female who is very well-known to me from a recent previous admission. On that admission the  patient had a third degree heart block which was thoroughly evaluated by cardiology and found to be likely vasovagal in nature.  Today the patient presents with complaints of twitching in her bilateral face left greater than right. The patient has a history of having twitching in her left side of her face for many years. This complaint was addressed by her primary care physician Dr. Nicholos Johns who gave her Xanax for this. Patient states that she will this morning with twitching in her face left side greater than right side. She became concerned as in the past and mostly been on the left side of her face. The patient denies any drooling she denies any numbness or tingling in her face she denies any weakness in any other parts of her body. The patient felt that she had no associated symptoms however her husband felt that her speech might have been somewhat slurred but however unsure about that. Presently Ms. Derrek Gu is fully awake alert oriented x3 she states that the twitching has diminished considerably and she describes no other neurological complaints.  He denies any chest pain, any dizziness, any nausea vomiting or diarrhea, any fever or chills, any orthopnea, any PND. She was found to have inverted T waves in V2 to V6 of 5 on a routine EKG which was the same as EKG and discharge him from the last admission. However this EKG is markedly changed from EKG on 09/15/2011.  Hospital Course:  Present on Admission:   Facial twitching : [name] presents with twitching bilaterally in her face left greater than right which is a chronic problem that seems to have intensified on today. MRI is negative for a stroke and symptoms are not consistent with TIA. However her symptoms rather seems to implicate pathology may be in the high cervical area or some type of mental tumor that may be causing compression on the nerves. The patient does have known parotid mass which is described at the parotid mass on the MRI. It is  unclear as to its role in the patient's presenting symptoms. I discussed with the patient about having an MRI of that area to live at this point she like to defer to Dr. Pollyann Kennedy isn't following her parotid mass.  This patient may however benefit from evaluation of the upper cervical area or facial x-ray to look for any mental masses that may be contributing to the fasciculations. At this time the patient does not want to pursue neurology consultation in the hospital.  2. Abnormal EKG: I have reviewed the patient's EKGs from previous hospitalization. The patient had a normal EKG on 09/15/2011 but had subsequent abnormal EKG suture consistent with the findings of this. The patient has no signs or symptoms consistent with an acute coronary syndrome, however review of her 2-D echocardiogram done on 09/20/2011 showed that she had hypokinesis and an EF of 40%. Repeat 2-D echocardiogram shows an ejection fraction of 50-55% and grade 1 diastolic dysfunction. The patient has also undergone a Lexiscan Myoview today. The results were reviewed by Dr. Myrtis Ser and show no reversible ischemia that's from cardiology perspective the  patient can be discharged home.  3. 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  4. Diabetes type 2: The patient's history he describes and history of diabetes however the patient is not on any medications for diabetes at home and her blood sugars are well-controlled. The patient was managed with sliding scale insulin  5. Hypertension: Continue lisinopril and Lasix.  6. Hypokalemia: Will replete orally and rechecked after repletion.  7. Hypothyroidism: TSH elevated at 9.963 Will increase Synthroid to 112 mcg. t.  8. Congestive heart failure: The patient was than on previous echocardiogram to have an ejection fraction of 40%. Her repeat echocardiogram shows grade 1 diastolic dysfunction and improved ejection  fraction of 55%. The patient is continued on her Lasix and her lisinopril appear  Disposition and Follow-up:  Patient to followup with her primary care physician Dr. Nicholos Johns in 3-5 days. She is to followup with Dr. Pollyann Kennedy regarding her parotid mass as previously arranged. Follow for Dr. Johney Frame by calling cardiology office to arrange an appointment. Discharge Orders    Future Appointments: Provider: Department: Dept Phone: Center:   11/15/2011 8:50 AM [facility] [phone] [facility]   11/26/2011 9:00 AM [facility] [phone] [facility]   11/26/2011 9:30 AM Pryor Ochoa, MD [facility] [phone] [facility]      DISCHARGE EXAM:  General: Alert, awake, oriented x3, in no acute distress.  Blood pressure 138/60, pulse 105, temperature 98.6 F (37 C), temperature source Oral, resp. rate 20, height 5\' 5"  (1.651 m), weight 70.308 kg (155 lb), SpO2 97.00%. HEENT: [place]/AT PEERL, EOMI  Neck: Trachea midline, no masses, no thyromegal,y no JVD, no carotid bruit  OROPHARYNX: Moist, No exudate/ erythema/lesions.  Heart: Regular rate and rhythm, without murmurs, rubs, gallops, PMI non-displaced, no heaves or thrills on palpation.  Lungs: Clear to auscultation, no wheezing or rhonchi noted. No increased vocal fremitus resonant to percussion  Abdomen: Soft, nontender, nondistended, positive bowel sounds, no masses no hepatosplenomegaly noted..  Neuro: No focal neurological deficits noted cranial nerves II through XII grossly intact. DTRs 2+ bilaterally upper and lower extremities. Strength 5 out of 5 in bilateral upper and lower extremities.  Musculoskeletal: No warm swelling or erythema around joints, no spinal tenderness noted.  Psychiatric: Patient alert and oriented x3, good insight and cognition, good recent to remote recall.  Lymph node survey: No cervical axillary or inguinal lymphadenopathy noted.    Basename 10/12/11 2017 10/12/11 1933 10/12/11 0507  NA -- --  141  K -- 3.5 2.9*  CL -- -- 99  CO2 -- -- --  GLUCOSE -- -- 117*  BUN -- -- 6  CREATININE 0.80 -- 0.90  CALCIUM -- -- --  MG -- 2.3 --  PHOS -- -- --   No results found for this basename: AST:2,ALT:2,ALKPHOS:2,BILITOT:2,PROT:2,ALBUMIN:2 in the last 72 hours No results found for this basename: LIPASE:2,AMYLASE:2 in the last 72 hours  Basename 10/14/11 1339 10/12/11 2017  WBC 9.0 6.8  NEUTROABS 6.0 --  HGB 11.8* 10.9*  HCT 35.3* 32.8*  MCV 101.1* 101.5*  PLT 348 279    Signed: Sherod Cisse A. 10/14/2011, 2:18 PM

## 2011-10-14 NOTE — Progress Notes (Signed)
  Echocardiogram 2D Echocardiogram has been performed.  Dewitt Hoes, RDCS 10/14/2011, 1:41 PM

## 2011-10-14 NOTE — Progress Notes (Signed)
Patient ID: Paula [name], female   DOB: Feb 21, 1927, 77 y.o.   MRN: [id number] SUBJECTIVE:   Please refer to my complete consult note from yesterday.  I have scheduled the patient to have a Lexis scan Myoview today.  Our team will try to review this study and make a final disposition plan from the cardiology viewpoint.  If the scan is completed today, and if there is no marked ischemia, she would be able to go home from the cardiology viewpoint.  However it often takes most of the day to get these results.  In addition sometimes the study has to be done as a 2 day study.  Also a followup two-dimensional echo had been ordered yesterday.  Hopefully that will be completed as the day goes on today also.  Filed Vitals:   10/13/11 1827 10/13/11 2230 10/14/11 0223 10/14/11 0520  BP: 148/79 137/79 150/75 144/67  Pulse: 81 78 78 81  Temp: 99.5 F (37.5 C) 98.6 F (37 C) 98.5 F (36.9 C) 98.6 F (37 C)  TempSrc: Oral Oral Oral Oral  Resp: 16 18 18 20   Height:      Weight:      SpO2: 93% 95% 95% 97%   No intake or output data in the 24 hours ending 10/14/11 0759  LABS: Basic Metabolic Panel:  Basename 10/12/11 2017 10/12/11 1933 10/12/11 0507  NA -- -- 141  K -- 3.5 2.9*  CL -- -- 99  CO2 -- -- --  GLUCOSE -- -- 117*  BUN -- -- 6  CREATININE 0.80 -- 0.90  CALCIUM -- -- --  MG -- 2.3 --  PHOS -- -- --   Liver Function Tests: No results found for this basename: AST:2,ALT:2,ALKPHOS:2,BILITOT:2,PROT:2,ALBUMIN:2 in the last 72 hours No results found for this basename: LIPASE:2,AMYLASE:2 in the last 72 hours CBC:  Basename 10/12/11 2017 10/12/11 0507 10/12/11 0444  WBC 6.8 -- 7.3  NEUTROABS -- -- --  HGB 10.9* 10.9* --  HCT 32.8* 32.0* --  MCV 101.5* -- 101.9*  PLT 279 -- 274   Cardiac Enzymes:  Basename 10/12/11 1139  CKTOTAL 27  CKMB 2.0  CKMBINDEX --  TROPONINI <0.30   BNP:  Basename 10/13/11 2027  POCBNP 1180.0*   D-Dimer: No results found for this basename: DDIMER:2  in the last 72 hours Hemoglobin A1C:  Basename 10/12/11 1139  HGBA1C 5.9*   Fasting Lipid Panel:  Basename 10/13/11 0600  CHOL 120  HDL 30*  LDLCALC 61  TRIG 782  CHOLHDL 4.0  LDLDIRECT --   Thyroid Function Tests:  Basename 10/12/11 1139  TSH 9.963*  T4TOTAL --  T3FREE --  THYROIDAB --    PHYSICAL EXAM     I have discussed the issues today again with the patient and her husband who is in the room.  She is stable at this time and oriented to person time and place.   ASSESSMENT AND PLAN:  Active Problems:  Complete heart block  Sinus bradycardia  Congestive heart failure   Ejection fraction The patient's ejection fraction is in the 40-45% range by prior echo. A 2-D echo had been ordered yesterday.  I am hopeful that we can get both the echo and a nuclear scan completed today and make decisions about her cardiac status by the end of the day if possible. I explained that we would try to do this to the Patient and her husband if possible.   Abnormal EKG     As outlined yesterday  the patient's EKG is abnormal.  She has had some type of cardiac event during the month of November, 2012.  Nuclear scan is to be done today to assess her overall status.  Contrast media allergy  Facial twitching   Willa Rough 10/14/2011 7:59 AM

## 2011-10-14 NOTE — Progress Notes (Signed)
Subjective: Patient has had no further twitching of her face since admission. Patient has just returned from her Myoview and 2-D echocardiogram results are not available. The patient has no complaints. She stated that she does not want to pursue any further evaluation of the parotid mass and will return to Dr. Pollyann Kennedy for further treatment of the known parotid mass there  Objective: Filed Vitals:   10/14/11 1043 10/14/11 1045 10/14/11 1047 10/14/11 1049  BP: 152/43 137/51 138/52 138/60  Pulse: 103 106 104 105  Temp:      TempSrc:      Resp:      Height:      Weight:      SpO2:       Weight change:   Intake/Output Summary (Last 24 hours) at 10/14/11 1408 Last data filed at 10/14/11 0800  Gross per 24 hour  Intake      0 ml  Output      0 ml  Net      0 ml    General: Alert, awake, oriented x3, in no acute distress.  HEENT: [place]/AT PEERL, EOMI Neck: Trachea midline,  no masses, no thyromegal,y no JVD, no carotid bruit OROPHARYNX:  Moist, No exudate/ erythema/lesions.  Heart: Regular rate and rhythm, without murmurs, rubs, gallops, PMI non-displaced, no heaves or thrills on palpation.  Lungs: Clear to auscultation, no wheezing or rhonchi noted. No increased vocal fremitus resonant to percussion  Abdomen: Soft, nontender, nondistended, positive bowel sounds, no masses no hepatosplenomegaly noted..  Neuro: No focal neurological deficits noted cranial nerves II through XII grossly intact. DTRs 2+ bilaterally upper and lower extremities. Strength 5 out of 5 in bilateral upper and lower extremities. Musculoskeletal: No warm swelling or erythema around joints, no spinal tenderness noted. Psychiatric: Patient alert and oriented x3, good insight and cognition, good recent to remote recall. Lymph node survey: No cervical axillary or inguinal lymphadenopathy noted.  Echo: Left ventricle: The cavity size was normal. Wall thickness was increased in a pattern of moderate LVH. Systolic function was  normal. The estimated ejection fraction was in the range of 55% to 60%. Wall motion was normal; there were no regional wall motion abnormalities. Doppler parameters are consistent with abnormal left ventricular relaxation (grade 1 diastolic dysfunction    Lab Results:  Basename 10/12/11 2017 10/12/11 1933 10/12/11 0507  NA -- -- 141  K -- 3.5 2.9*  CL -- -- 99  CO2 -- -- --  GLUCOSE -- -- 117*  BUN -- -- 6  CREATININE 0.80 -- 0.90  CALCIUM -- -- --  MG -- 2.3 --  PHOS -- -- --   No results found for this basename: AST:2,ALT:2,ALKPHOS:2,BILITOT:2,PROT:2,ALBUMIN:2 in the last 72 hours No results found for this basename: LIPASE:2,AMYLASE:2 in the last 72 hours  Basename 10/12/11 2017 10/12/11 0507 10/12/11 0444  WBC 6.8 -- 7.3  NEUTROABS -- -- --  HGB 10.9* 10.9* --  HCT 32.8* 32.0* --  MCV 101.5* -- 101.9*  PLT 279 -- 274    Basename 10/12/11 1139  CKTOTAL 27  CKMB 2.0  CKMBINDEX --  TROPONINI <0.30    Basename 10/13/11 2027  POCBNP 1180.0*   No results found for this basename: DDIMER:2 in the last 72 hours  Basename 10/12/11 1139  HGBA1C 5.9*    Basename 10/13/11 0600  CHOL 120  HDL 30*  LDLCALC 61  TRIG 161  CHOLHDL 4.0  LDLDIRECT --    Basename 10/12/11 1139  TSH 9.963*  T4TOTAL --  T3FREE --  THYROIDAB --   No results found for this basename: VITAMINB12:2,FOLATE:2,FERRITIN:2,TIBC:2,IRON:2,RETICCTPCT:2 in the last 72 hours  Micro Results: No results found for this or any previous visit (from the past 240 hour(s)).  Studies/Results: Dg Chest 2 View  10/05/2011  *RADIOLOGY REPORT*  Clinical Data: Shortness of breath.  History of aortic aneurysm. Recently hospitalized for hypertension.  Ex-smoker as of 6 weeks ago.  CHEST - 2 VIEW  Comparison: 09/19/2011  Findings: Degraded lateral view secondary patient positioning. Underlying hyperinflation.  Mild osteopenia.  Cardiomegaly with atherosclerosis in the transverse aorta.  A small left pleural  effusion is decreased since 09/19/2011. No pneumothorax.  Biapical pleural thickening. No congestive failure.  Mild bibasilar volume loss.  IMPRESSION:  1.  Cardiomegaly and hyperinflation, without congestive failure. 2.  Small left pleural effusion.  Original Report Authenticated By: Consuello Bossier, M.D.   Dg Chest 2 View  09/15/2011  *RADIOLOGY REPORT*  Clinical Data: Lies weakness  CHEST - 2 VIEW  Comparison: 08/04/2011  Findings: Mild cardiomegaly.  Left midlung calcified granuloma. There are ill-defined opacities at the left base worrisome for patchy airspace disease.  Lungs are otherwise clear.  Pulmonary vascularity is within normal limits.  IMPRESSION: Patchy opacities at the left base worrisome for airspace disease. Follow-up radiographs until resolution are recommended.  Original Report Authenticated By: Donavan Burnet, M.D.   Ct Head Wo Contrast  10/12/2011  *RADIOLOGY REPORT*  Clinical Data: Facial droop.  CT HEAD WITHOUT CONTRAST  Technique:  Contiguous axial images were obtained from the base of the skull through the vertex without contrast.  Comparison: CT of the head performed 09/19/2011  Findings: There is no evidence of acute infarction, mass lesion, or intra- or extra-axial hemorrhage on CT.  Prominence of the ventricles and sulci reflects mild to moderate cortical volume loss.  The degree of ventricular prominence appears grossly stable from multiple prior studies.  Periventricular and subcortical white matter change likely reflects small vessel ischemic microangiopathy.  The posterior fossa, including the cerebellum, brainstem and fourth ventricle, is within normal limits.  The basal ganglia are unremarkable in appearance.  The cerebral hemispheres demonstrate grossly normal gray-white differentiation.  No mass effect or midline shift is seen.  There is no evidence of fracture; visualized osseous structures are unremarkable in appearance.  The visualized portions of the orbits are within  normal limits.  The paranasal sinuses and mastoid air cells are well-aerated.  No significant soft tissue abnormalities are seen.  IMPRESSION:  1.  No acute intracranial pathology seen on CT. 2.  Mild to moderate cortical volume loss and diffuse small vessel ischemic microangiopathy.  Original Report Authenticated By: Tonia Ghent, M.D.   Ct Head Wo Contrast  09/19/2011  *RADIOLOGY REPORT*  Clinical Data: Recent fall, altered mental status.  CT HEAD WITHOUT CONTRAST  Technique:  Contiguous axial images were obtained from the base of the skull through the vertex without contrast.  Comparison: 09/18/2011 MRI  Findings: Periventricular and subcortical white matter hypodensities are most in keeping with chronic microangiopathic change. There is no evidence for acute hemorrhage, hydrocephalus, mass lesion, or abnormal extra-axial fluid collection.  No definite CT evidence for acute infarction.  The visualized paranasal sinuses and mastoid air cells are predominately clear.  No displaced calvarial fracture.  Bilateral lens replacement.  IMPRESSION: No acute intracranial abnormality.  Unchanged white matter hypodensities.  Original Report Authenticated By: Waneta Martins, M.D.   Ct Head Wo Contrast  09/15/2011  *RADIOLOGY REPORT*  Clinical Data:  Numbness.  Lightheadedness.  CT HEAD WITHOUT CONTRAST  Technique:  Contiguous axial images were obtained from the base of the skull through the vertex without contrast.  Comparison: None.  Findings: Global atrophy.  Chronic ischemic changes in the periventricular white matter.  No mass effect, midline shift, or acute intracranial hemorrhage.  Mastoid air cells are clear. Visualized paranasal sinuses are clear.  Cranium is intact.  IMPRESSION: No acute intracranial pathology.  Chronic changes.  Original Report Authenticated By: Donavan Burnet, M.D.   Mri Brain Without Contrast  10/12/2011  *RADIOLOGY REPORT*  Clinical Data:  Bilateral facial twitching, left greater  than right.  History of diabetes and hypertension.  MRI HEAD WITHOUT CONTRAST MRA HEAD WITHOUT CONTRAST  Technique:  Multiplanar, multiecho pulse sequences of the brain and surrounding structures were obtained without intravenous contrast. Angiographic images of the head were obtained using MRA technique without contrast.  Comparison:  CT head earlier in the day.  MRI brain 09/18/2011.  MRI HEAD  Findings:  There is no evidence for acute infarction, intracranial hemorrhage, mass lesion, hydrocephalus, or extra-axial fluid. There is moderate atrophy with chronic microvascular ischemic change. Dolichoectatic but otherwise widely patent intracranial vasculature.  Scattered stable bifrontal micro hemorrhage is likely secondary to hypertensive cerebrovascular disease.  Incompletely evaluated but persistent diminished T2 signal deep lobe parotid mass with coronal cross-sectional measurements of 17 x 13 mm.  This is statistically indeterminate for a benign or malignant lesion, but the low T2 signal can portend a more ominous process.  Recommend MRI of the neck without and with contrast.  IMPRESSION: No acute intracranial findings.  Persistent atrophy and small vessel disease.  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  Recommend further evaluation with MRI of the neck without and with contrast.  MRA HEAD  Findings: Widely patent but dolichoectatic carotid and basilar arteries.  Left greater than right vertebral arteries supply the basilar without visible stenosis.  The anterior, middle and posterior cerebral arteries are all widely patent. There is no visible intracranial aneurysm.  Mild irregularity of the distal MCA and PCA branches suggest intracranial atherosclerotic change secondary to hypertension or diabetes.  IMPRESSION: No proximal flow limiting stenosis.  Moderate dolichoectasia and mild distal irregularity.  Original Report Authenticated By: Elsie Stain, M.D.   Mr Brain Wo Contrast  09/18/2011   *RADIOLOGY REPORT*  Clinical Data: 77 year old female with confusion, dysphasia, unsteady gait.  The patient reports an allergy to contrast (listed in the medical record is iodinated contrast) and declined gadolinium contrast for this exam.  MRI HEAD WITHOUT CONTRAST  Technique:  Multiplanar, multiecho pulse sequences of the brain and surrounding structures were obtained according to standard protocol without intravenous contrast.  Comparison: Head CTs 09/15/2011 and earlier. Neck CT 05/13/2011.  Findings: No restricted diffusion to suggest acute infarction.  No midline shift, mass effect, evidence of mass lesion, ventriculomegaly, extra-axial collection or acute intracranial hemorrhage.  Cervicomedullary junction and pituitary are within normal limits.  Major intracranial vascular flow voids are preserved with a degree of intracranial artery dolichoectasia.  Scattered chronic micro hemorrhages, mostly in the frontal lobe subcortical white matter (series 7 image 12, 13).  Confluent periventricular and central cerebral white matter T2 and FLAIR hyperintensity.  Comparatively mild T2 hyperintensity in the pons. Deep gray matter nuclei and cerebellum within normal limits for age.  Negative visualized cervical spine. Visualized bone marrow signal is within normal limits.  Grossly normal visualized internal auditory structures.  Postoperative changes to the globes. Visualized paranasal  sinuses and mastoids are clear.  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 This is stable since 05/13/2011.  IMPRESSION: 1. No acute intracranial abnormality.  Chronic small vessel disease. 2.  Right parotid gland mass is stable since 05/13/2011, but decreased T2 signal is noted and tumors with low signal intensity on T2-weighted images are more likely to be malignant even when they are well-demarcated.  Recommend ENT follow-up.  Original Report  Authenticated By: Harley Hallmark, M.D.   Nm Pulmonary Per & Vent  10/05/2011  *RADIOLOGY REPORT*  Clinical Data:  Shortness of breath, increased D-dimer, contrast allergy  NUCLEAR MEDICINE VENTILATION - PERFUSION LUNG SCAN  Technique:  Wash-in, equilibrium, and wash-out phase ventilation images were obtained using Xe-133 gas.  Perfusion images were obtained in multiple projections after intravenous injection of Tc- 59m MAA.  Radiopharmaceuticals:  10 mCi Xe-133 gas and 6.0 mCi Tc-75m MAA.  Comparison:  None Correlation:  Chest radiograph 10/05/2011  Findings: Ventilation exam in anterior and posterior projections demonstrates a slightly smaller left lung than right. Minimal nonsegmental diminished ventilation at left lung base. No significant xenon retention.  Perfusion lung scan in eight projections shows slightly smaller left lung than right. Minimal nonsegmental diminished perfusion at left lung base. No segmental or subsegmental perfusion defects identified.  Chest radiograph is significant for cardiomegaly and small left pleural effusion.  IMPRESSION: Minimal nonsegmental diminished perfusion and ventilation at left lung base, likely related to small left pleural effusion. Findings represent a very low probability pulmonary embolism.  Original Report Authenticated By: Lollie Marrow, M.D.   Dg Chest Portable 1 View  09/20/2011  *RADIOLOGY REPORT*  Clinical Data: Pneumonia  PORTABLE CHEST - 1 VIEW  Comparison: 09/15/2011  Findings: A right internal jugular vein temporary pacemaker has been placed.  Tip projects over the right ventricle.  Heart is moderately enlarged.  Vascular congestion has developed.  Bibasilar haziness worrisome for developing airspace disease or edema is worse.  No pneumothorax.  IMPRESSION: Right internal jugular vein temporary pacemaker tip projects over the right ventricle and no pneumothorax  Increasing vascular congestion and bibasilar airspace disease worrisome for edema.   Original Report Authenticated By: Donavan Burnet, M.D.   Mr Mra Head/brain Wo Cm  10/12/2011  *RADIOLOGY REPORT*  Clinical Data:  Bilateral facial twitching, left greater than right.  History of diabetes and hypertension.  MRI HEAD WITHOUT CONTRAST MRA HEAD WITHOUT CONTRAST  Technique:  Multiplanar, multiecho pulse sequences of the brain and surrounding structures were obtained without intravenous contrast. Angiographic images of the head were obtained using MRA technique without contrast.  Comparison:  CT head earlier in the day.  MRI brain 09/18/2011.  MRI HEAD  Findings:  There is no evidence for acute infarction, intracranial hemorrhage, mass lesion, hydrocephalus, or extra-axial fluid. There is moderate atrophy with chronic microvascular ischemic change. Dolichoectatic but otherwise widely patent intracranial vasculature.  Scattered stable bifrontal micro hemorrhage is likely secondary to hypertensive cerebrovascular disease.  Incompletely evaluated but persistent diminished T2 signal deep lobe parotid mass with coronal cross-sectional measurements of 17 x 13 mm.  This is statistically indeterminate for a benign or malignant lesion, but the low T2 signal can portend a more ominous process.  Recommend MRI of the neck without and with contrast.  IMPRESSION: No acute intracranial findings.  Persistent atrophy and small vessel disease.  Persistent deep lobe parotid mass on the  right.  Recommend further evaluation with MRI of the neck without and with contrast.  MRA HEAD  Findings: Widely patent but dolichoectatic carotid and basilar arteries.  Left greater than right vertebral arteries supply the basilar without visible stenosis.  The anterior, middle and posterior cerebral arteries are all widely patent. There is no visible intracranial aneurysm.  Mild irregularity of the distal MCA and PCA branches suggest intracranial atherosclerotic change secondary to hypertension or diabetes.  IMPRESSION: No proximal flow  limiting stenosis.  Moderate dolichoectasia and mild distal irregularity.  Original Report Authenticated By: Elsie Stain, M.D.    Medications: I have reviewed the patient's current medications. Scheduled Meds:    . acetaminophen      . amitriptyline  10 mg Oral QHS  . aspirin  325 mg Oral Daily  . buPROPion  100 mg Oral Daily  . calcium carbonate  1 tablet Oral Daily  . cholecalciferol  1,000 Units Oral Daily  . enoxaparin (LOVENOX) injection  40 mg Subcutaneous QHS  . furosemide  20 mg Oral Daily  . insulin aspart  0-15 Units Subcutaneous TID WC  . insulin aspart  0-5 Units Subcutaneous QHS  . levothyroxine  100 mcg Oral Daily  . lisinopril  5 mg Oral Daily  . nitrofurantoin (macrocrystal-monohydrate)  100 mg Oral BID  . ondansetron      . regadenoson  0.4 mg Intravenous Once  . sodium chloride  3 mL Intravenous Q12H   Continuous Infusions:  PRN Meds:.sodium chloride, acetaminophen, acetaminophen, ALPRAZolam, hydroxypropyl methylcellulose, ondansetron, sodium chloride Assessment/Plan: Patient Active Hospital Problem List:  1. Facial twitching : [name] presents with twitching bilaterally in her face left greater than right which is a chronic problem that seems to have intensified on today. MRI is negative for  a stroke and symptoms are not consistent with TIA. However her symptoms rather seems to implicate pathology may be in the high cervical area or some type of mental tumor that may be causing compression on the nerves. The patient does have known parotid mass which is described at the parotid mass on the MRI. It is unclear as to its role in the patient's presenting symptoms. I discussed with the patient about having an MRI of that area to live at this point she like to defer to Dr. Pollyann Kennedy isn't following her parotid mass. This patient may however benefit from evaluation of the upper cervical area or facial x-ray to look for any mental masses that may be contributing to the  fasciculations. At this time the patient does not want to pursue neurology consultation in the hospital.  2. Abnormal EKG: I have reviewed the patient's EKGs from previous hospitalization. The patient had a normal EKG on 09/15/2011 but had subsequent abnormal EKG suture consistent with the findings of this. The patient has no signs or symptoms consistent with an acute coronary syndrome, however review of her 2-D echocardiogram done on 09/20/2011 showed that she had hypokinesis and an EF of 40%. Repeat 2-D echocardiogram shows an ejection fraction of 50-55% and grade 1 diastolic dysfunction. The patient has also undergone a Lexiscan Myoview today. The results are pending per cardiology the results are shown no reversible ischemia the patient can be discharged home.  3. 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  4. Diabetes type 2: The patient's history he describes and history of diabetes however the patient is not on any medications  for diabetes at home and her blood sugars are well-controlled. The patient was managed with sliding scale insulin   5. Hypertension: Continue lisinopril and Lasix.   6. Hypokalemia: Will replete orally and rechecked after repletion.   7. Hypothyroidism:  TSH elevated at 9.963 Will increase Synthroid to 112 mcg. t.   8. DVT prophylaxis: Continue Lovenox.   Patient's discharge will be contingent upon recommendations from cardiology.      No active hospital problems.   LOS: 2 days

## 2011-10-15 ENCOUNTER — Telehealth: Payer: Self-pay | Admitting: Cardiology

## 2011-10-15 NOTE — Telephone Encounter (Signed)
New message:  Pt was discharged from [facility] last yesterday and would like a better explanation of the test she had while in the hospital.  Dr. Myrtis Ser was very vague.  Please call patient regarding the details of the test.

## 2011-10-15 NOTE — Telephone Encounter (Signed)
Pt called and discussed hospitalization.  Appt scheduled for her to f/u with Dr Myrtis Ser per his request.

## 2011-10-15 NOTE — Progress Notes (Signed)
Retro ur ins reivew 

## 2011-10-17 NOTE — Telephone Encounter (Signed)
Results of myoview given to pt again.

## 2011-10-17 NOTE — Telephone Encounter (Signed)
FU Call: Pt husband calling stating that he would like to talk with someone regarding pt hospitalization. Pt nor wife do not recall talking to someone in our office in the past. Please return pt husband call to discuss further.

## 2011-10-18 ENCOUNTER — Ambulatory Visit: Payer: Medicare Other

## 2011-10-25 ENCOUNTER — Inpatient Hospital Stay (HOSPITAL_COMMUNITY)
Admission: EM | Admit: 2011-10-25 | Discharge: 2011-10-28 | DRG: 194 | Disposition: A | Discharge status: Home / Self Care | Payer: Medicare Other | Attending: Internal Medicine | Admitting: Internal Medicine

## 2011-10-25 ENCOUNTER — Emergency Department (HOSPITAL_COMMUNITY): Payer: Medicare Other

## 2011-10-25 ENCOUNTER — Encounter (HOSPITAL_COMMUNITY): Payer: Self-pay | Admitting: Internal Medicine

## 2011-10-25 ENCOUNTER — Encounter (HOSPITAL_COMMUNITY): Payer: Self-pay

## 2011-10-25 ENCOUNTER — Other Ambulatory Visit: Payer: Self-pay

## 2011-10-25 ENCOUNTER — Emergency Department (INDEPENDENT_AMBULATORY_CARE_PROVIDER_SITE_OTHER)
Admission: EM | Admit: 2011-10-25 | Discharge: 2011-10-25 | Disposition: A | Discharge status: Nursing Home (Includes ICF) | Payer: Medicare Other | Source: Home / Self Care | Attending: Family Medicine | Admitting: Family Medicine

## 2011-10-25 DIAGNOSIS — J449 Chronic obstructive pulmonary disease, unspecified: Secondary | ICD-10-CM | POA: Diagnosis present

## 2011-10-25 DIAGNOSIS — F341 Dysthymic disorder: Secondary | ICD-10-CM | POA: Diagnosis present

## 2011-10-25 DIAGNOSIS — E039 Hypothyroidism, unspecified: Secondary | ICD-10-CM | POA: Diagnosis present

## 2011-10-25 DIAGNOSIS — R5381 Other malaise: Secondary | ICD-10-CM | POA: Diagnosis present

## 2011-10-25 DIAGNOSIS — K118 Other diseases of salivary glands: Secondary | ICD-10-CM | POA: Diagnosis present

## 2011-10-25 DIAGNOSIS — F32A Depression, unspecified: Secondary | ICD-10-CM | POA: Diagnosis present

## 2011-10-25 DIAGNOSIS — R0600 Dyspnea, unspecified: Secondary | ICD-10-CM

## 2011-10-25 DIAGNOSIS — E1151 Type 2 diabetes mellitus with diabetic peripheral angiopathy without gangrene: Secondary | ICD-10-CM | POA: Diagnosis present

## 2011-10-25 DIAGNOSIS — J189 Pneumonia, unspecified organism: Principal | ICD-10-CM

## 2011-10-25 DIAGNOSIS — I5022 Chronic systolic (congestive) heart failure: Secondary | ICD-10-CM | POA: Diagnosis present

## 2011-10-25 DIAGNOSIS — J4489 Other specified chronic obstructive pulmonary disease: Secondary | ICD-10-CM | POA: Diagnosis present

## 2011-10-25 DIAGNOSIS — I10 Essential (primary) hypertension: Secondary | ICD-10-CM | POA: Diagnosis present

## 2011-10-25 DIAGNOSIS — R220 Localized swelling, mass and lump, head: Secondary | ICD-10-CM | POA: Diagnosis present

## 2011-10-25 DIAGNOSIS — E119 Type 2 diabetes mellitus without complications: Secondary | ICD-10-CM | POA: Diagnosis present

## 2011-10-25 DIAGNOSIS — J439 Emphysema, unspecified: Secondary | ICD-10-CM | POA: Diagnosis present

## 2011-10-25 DIAGNOSIS — I509 Heart failure, unspecified: Secondary | ICD-10-CM | POA: Diagnosis present

## 2011-10-25 DIAGNOSIS — F329 Major depressive disorder, single episode, unspecified: Secondary | ICD-10-CM | POA: Diagnosis present

## 2011-10-25 DIAGNOSIS — R0609 Other forms of dyspnea: Secondary | ICD-10-CM

## 2011-10-25 DIAGNOSIS — I959 Hypotension, unspecified: Secondary | ICD-10-CM | POA: Diagnosis present

## 2011-10-25 DIAGNOSIS — F419 Anxiety disorder, unspecified: Secondary | ICD-10-CM | POA: Diagnosis present

## 2011-10-25 LAB — BASIC METABOLIC PANEL
BUN: 9 mg/dL (ref 6–23)
Chloride: 105 mEq/L (ref 96–112)
GFR calc Af Amer: 87 mL/min — ABNORMAL LOW (ref >90)
Glucose, Bld: 146 mg/dL — ABNORMAL HIGH (ref 70–99)
Potassium: 3.4 mEq/L — ABNORMAL LOW (ref 3.5–5.1)
Sodium: 141 mEq/L (ref 135–145)

## 2011-10-25 LAB — CBC
Hemoglobin: 11.2 g/dL — ABNORMAL LOW (ref 12.0–15.0)
MCH: 33.3 pg (ref 26.0–34.0)
MCV: 102.4 fL — ABNORMAL HIGH (ref 78.0–100.0)
Platelets: 322 10*3/uL (ref 150–400)
RBC: 3.37 MIL/uL — ABNORMAL LOW (ref 3.87–5.11)
RDW: 14.2 % (ref 11.5–15.5)

## 2011-10-25 LAB — URINALYSIS, ROUTINE W REFLEX MICROSCOPIC
Glucose, UA: NEGATIVE mg/dL
Nitrite: NEGATIVE
Protein, ur: NEGATIVE mg/dL

## 2011-10-25 LAB — PRO B NATRIURETIC PEPTIDE: Pro B Natriuretic peptide (BNP): 320 pg/mL (ref 0–450)

## 2011-10-25 LAB — HEPATIC FUNCTION PANEL
ALT: 10 U/L (ref 0–35)
Bilirubin, Direct: <0.1 mg/dL (ref 0.0–0.3)
Total Bilirubin: 0.3 mg/dL (ref 0.3–1.2)

## 2011-10-25 LAB — DIFFERENTIAL
Basophils Relative: 1 % (ref 0–1)
Lymphs Abs: 2.7 10*3/uL (ref 0.7–4.0)
Monocytes Relative: 8 % (ref 3–12)
Neutro Abs: 6 10*3/uL (ref 1.7–7.7)
Neutrophils Relative %: 61 % (ref 43–77)

## 2011-10-25 LAB — PROTIME-INR: INR: 1.04 (ref 0.00–1.49)

## 2011-10-25 LAB — URINE MICROSCOPIC-ADD ON

## 2011-10-25 LAB — CARDIAC PANEL(CRET KIN+CKTOT+MB+TROPI)
Relative Index: INVALID (ref 0.0–2.5)
Troponin I: <0.3 ng/mL (ref <0.30)

## 2011-10-25 MED ORDER — ENOXAPARIN SODIUM 40 MG/0.4ML ~~LOC~~ SOLN
40.0000 mg | SUBCUTANEOUS | Status: DC
  Administered 2011-10-25 – 2011-10-27 (×3): 40 mg via SUBCUTANEOUS
  Filled 2011-10-25 (×4): qty 0.4

## 2011-10-25 MED ORDER — DEXTROSE 5 % IV SOLN
1.0000 g | Freq: Two times a day (BID) | INTRAVENOUS | Status: DC
  Administered 2011-10-25 – 2011-10-28 (×6): 1 g via INTRAVENOUS
  Filled 2011-10-25 (×7): qty 1

## 2011-10-25 MED ORDER — LEVOTHYROXINE SODIUM 150 MCG PO TABS
150.0000 ug | ORAL_TABLET | ORAL | Status: DC
  Administered 2011-10-26 – 2011-10-28 (×3): 150 ug via ORAL
  Filled 2011-10-25 (×4): qty 1

## 2011-10-25 MED ORDER — GUAIFENESIN ER 600 MG PO TB12
600.0000 mg | ORAL_TABLET | Freq: Two times a day (BID) | ORAL | Status: DC
  Administered 2011-10-25 – 2011-10-28 (×6): 600 mg via ORAL
  Filled 2011-10-25 (×7): qty 1

## 2011-10-25 MED ORDER — LISINOPRIL 5 MG PO TABS
5.0000 mg | ORAL_TABLET | Freq: Every day | ORAL | Status: DC
  Administered 2011-10-26 – 2011-10-28 (×3): 5 mg via ORAL
  Filled 2011-10-25 (×4): qty 1

## 2011-10-25 MED ORDER — HYDROCODONE-ACETAMINOPHEN 5-325 MG PO TABS: 1.0000 | ORAL_TABLET | ORAL | Status: DC | PRN

## 2011-10-25 MED ORDER — THERA M PLUS PO TABS
1.0000 | ORAL_TABLET | Freq: Every day | ORAL | Status: DC
  Administered 2011-10-26 – 2011-10-28 (×3): 1 via ORAL
  Filled 2011-10-25 (×3): qty 1

## 2011-10-25 MED ORDER — ASPIRIN 325 MG PO TABS
325.0000 mg | ORAL_TABLET | Freq: Every day | ORAL | Status: DC
  Administered 2011-10-26 – 2011-10-28 (×3): 325 mg via ORAL
  Filled 2011-10-25 (×4): qty 1

## 2011-10-25 MED ORDER — MORPHINE SULFATE 2 MG/ML IJ SOLN: 1.0000 mg | INTRAMUSCULAR | Status: DC | PRN

## 2011-10-25 MED ORDER — ONDANSETRON HCL 4 MG/2ML IJ SOLN: 4.0000 mg | Freq: Four times a day (QID) | INTRAMUSCULAR | Status: DC | PRN

## 2011-10-25 MED ORDER — VITAMIN D 1000 UNITS PO TABS
5000.0000 [IU] | ORAL_TABLET | Freq: Every day | ORAL | Status: DC
  Administered 2011-10-26 – 2011-10-28 (×3): 5000 [IU] via ORAL
  Filled 2011-10-25 (×3): qty 5

## 2011-10-25 MED ORDER — FUROSEMIDE 20 MG PO TABS
20.0000 mg | ORAL_TABLET | ORAL | Status: DC
  Administered 2011-10-26 – 2011-10-28 (×3): 20 mg via ORAL
  Filled 2011-10-25 (×4): qty 1

## 2011-10-25 MED ORDER — DEXTROSE 5 % IV SOLN
1.0000 g | INTRAVENOUS | Status: AC
  Filled 2011-10-25: qty 1

## 2011-10-25 MED ORDER — ACETAMINOPHEN 650 MG RE SUPP: 650.0000 mg | Freq: Four times a day (QID) | RECTAL | Status: DC | PRN

## 2011-10-25 MED ORDER — SODIUM CHLORIDE 0.9 % IV SOLN
INTRAVENOUS | Status: DC
  Administered 2011-10-25 – 2011-10-27 (×3): via INTRAVENOUS

## 2011-10-25 MED ORDER — AMITRIPTYLINE HCL 10 MG PO TABS
10.0000 mg | ORAL_TABLET | Freq: Every day | ORAL | Status: DC
  Administered 2011-10-25 – 2011-10-27 (×3): 10 mg via ORAL
  Filled 2011-10-25 (×4): qty 1

## 2011-10-25 MED ORDER — VANCOMYCIN HCL IN DEXTROSE 1-5 GM/200ML-% IV SOLN
1000.0000 mg | Freq: Once | INTRAVENOUS | Status: AC
  Administered 2011-10-25: 1000 mg via INTRAVENOUS
  Filled 2011-10-25: qty 200

## 2011-10-25 MED ORDER — BUPROPION HCL ER (XL) 300 MG PO TB24
300.0000 mg | ORAL_TABLET | ORAL | Status: DC
  Administered 2011-10-26 – 2011-10-28 (×3): 300 mg via ORAL
  Filled 2011-10-25 (×4): qty 1

## 2011-10-25 MED ORDER — CALCIUM CARBONATE 600 MG PO TABS
600.0000 mg | ORAL_TABLET | Freq: Every day | ORAL | Status: DC
  Filled 2011-10-25: qty 1

## 2011-10-25 MED ORDER — VANCOMYCIN HCL 1000 MG IV SOLR
750.0000 mg | Freq: Two times a day (BID) | INTRAVENOUS | Status: DC
  Administered 2011-10-26 – 2011-10-28 (×5): 750 mg via INTRAVENOUS
  Filled 2011-10-25 (×6): qty 750

## 2011-10-25 MED ORDER — ONDANSETRON HCL 4 MG PO TABS
4.0000 mg | ORAL_TABLET | Freq: Four times a day (QID) | ORAL | Status: DC | PRN
  Administered 2011-10-26 – 2011-10-27 (×2): 4 mg via ORAL
  Filled 2011-10-25 (×2): qty 1

## 2011-10-25 MED ORDER — ONDANSETRON HCL 4 MG/2ML IJ SOLN
INTRAMUSCULAR | Status: AC
  Filled 2011-10-25: qty 2

## 2011-10-25 MED ORDER — ALPRAZOLAM 0.5 MG PO TABS
0.5000 mg | ORAL_TABLET | Freq: Two times a day (BID) | ORAL | Status: DC
  Administered 2011-10-25: 0.5 mg via ORAL
  Filled 2011-10-25: qty 2

## 2011-10-25 MED ORDER — CALCIUM CARBONATE 1250 (500 CA) MG PO TABS
1.0000 | ORAL_TABLET | Freq: Every day | ORAL | Status: DC
  Administered 2011-10-25 – 2011-10-28 (×4): 500 mg via ORAL
  Filled 2011-10-25 (×4): qty 1

## 2011-10-25 MED ORDER — ACETAMINOPHEN 325 MG PO TABS
650.0000 mg | ORAL_TABLET | Freq: Four times a day (QID) | ORAL | Status: DC | PRN
  Administered 2011-10-26 – 2011-10-27 (×3): 650 mg via ORAL
  Filled 2011-10-25 (×3): qty 2

## 2011-10-25 NOTE — ED Notes (Signed)
Reported recent MI, released from hospital Tuesday last week; c/o problem breathing since last Pm , and all day today ; not called MD

## 2011-10-25 NOTE — H&P (Signed)
PCP:   Georgianne Fick, MD, MD   Chief Complaint:  Shortness of breath, cough, generalized weakness.  HPI: 77 year old female with a past medical history significant for hypothyroidism, emphysema, diabetes mellitus (controlled with diet) hypertension and CHF (systolic in nature, last 2-D echo November 2012); who came into the hospital secondary to shortness of breath , cough and generalized weakness. Patient denies any fevers or chills, she denies any chest pain, no nausea/vomiting or abdominal pain. Patient was recently in the hospital secondary to facial twitching (Associated most likely 2 hypokalemia). Patient reports that the cough and weakness was getting worse and she came to the ED for further evaluation and treatment. In Ed she was found to have a new LLL airspace disease with high concerns for pneumonia. [facility] has been called to admit the patient for further evaluation and treatment.   Allergies:   Allergies  Allergen Reactions  . Contrast Media (Iodinated Diagnostic Agents) Anaphylaxis  . Sulfa Antibiotics Anaphylaxis  . Iohexol      Desc: PT STATES SHE HAD A SEVERE REACTION TO IV CONRAST WITH THROAT SWELLING AND SOB. SHE WAS ADMITTED TO THE HOSPITAL. SHE HAS NEVER HAD CONTRAST AGAIN.   Marland Kitchen Penicillins Rash      Past Medical History  Diagnosis Date  . Diabetes mellitus   . Thyroid disease   . Abdominal aneurysm   . COPD (chronic obstructive pulmonary disease)   . Hypertension   . Shortness of breath   . Hypothyroidism   . Depression   . Recurrent upper respiratory infection (URI)   . Seizures   . Ejection fraction     EF 40%, echo, September 20, 2011, severe hypokinesis of the entire apex  . Abnormal EKG     November, 2012  . Contrast media allergy     Patient feels poorly with contrast    Past Surgical History  Procedure Date  . Cholecystectomy   . Joint replacement   . Sinus surgery with instatrak   . Appendectomy   . Oophorectomy   .  Arthroscopic repair acl     Prior to Admission medications   Medication Sig Start Date End Date Taking? Authorizing Provider  ALPRAZolam Prudy Feeler) 0.5 MG tablet Take 0.5-1 mg by mouth 2 (two) times daily. Takes 1 tab in the am & 2 tabs at night    Yes Historical Provider, MD  amitriptyline (ELAVIL) 10 MG tablet Take 10 mg by mouth at bedtime.     Yes Historical Provider, MD  aspirin 325 MG tablet Take 325 mg by mouth daily.   10/14/11 10/13/12 Yes Michelle A. Matthews  buPROPion (WELLBUTRIN XL) 300 MG 24 hr tablet Take 300 mg by mouth every morning.     Yes Historical Provider, MD  calcium carbonate (OS-CAL) 600 MG TABS Take 600 mg by mouth daily.     Yes Historical Provider, MD  carbamazepine (TEGRETOL) 100 MG chewable tablet Chew 100-200 mg by mouth 2 (two) times daily. PT TAKES 2 TABS IN THE MORNING & 1 TAB AT NIGHT. HOWEVER THE DIRECTIONS ON THE BOTTLE STATE TO TAKE 4 TABS IN THE MORNING & 2 TABS AT NIGHT.    Yes Historical Provider, MD  Cholecalciferol (VITAMIN D3) 5000 UNITS CAPS Take 1 capsule by mouth daily.     Yes Historical Provider, MD  furosemide (LASIX) 20 MG tablet Take 20 mg by mouth every morning.   10/14/11 10/13/12 Yes Michelle A. Matthews  levothyroxine (SYNTHROID, LEVOTHROID) 150 MCG tablet Take 150 mcg by mouth every  morning.     Yes Historical Provider, MD  lisinopril (PRINIVIL,ZESTRIL) 5 MG tablet Take 1 tablet (5 mg total) by mouth daily. 10/07/11 10/06/12 Yes Nishant Dhungel, MD  OVER THE COUNTER MEDICATION Take 1 capsule by mouth every evening. LUTEIN 45MG  WITH ZEAXANTHIN    Yes Historical Provider, MD  ondansetron (ZOFRAN-ODT) 8 MG disintegrating tablet Take 8 mg by mouth every 8 (eight) hours as needed. FOR NAUSEA     Historical Provider, MD    Social History:  reports that she quit smoking about 7 weeks ago. She does not have any smokeless tobacco history on file. She reports that she does not drink alcohol or use illicit drugs.  Family History  Problem Relation Age of  Onset  . Cancer Father     Review of Systems:  A comprehensive review of systems was negative except as noted in the history of present illness.   Physical Exam: Blood pressure 149/84, pulse 95, temperature 98.7 F (37.1 C), temperature source Oral, resp. rate 18, SpO2 97.00%. General: Alert, awake, oriented x3, in no acute distress. Patient reports generalized weakness and malaise.  HEENT: [place]/AT PEERLA, EOMI, no icterus, no nystagmus. No discharges of her ears or nostrils.  Neck: Trachea midline, no masses, no thyromegal,y no JVD, no carotid bruit. OROPHARYNX: Mild dryness of her mucous membranes, No exudate/ erythema/lesions.  Heart: Regular rate and rhythm, without murmurs, rubs, gallops.  Lungs: No wheezing, no crackles. Scattered rhonchi diffusely mild decreased breath sounds on her left lung field at the bases. Abdomen: Soft, nontender, nondistended, positive bowel sounds, no masses no hepatosplenomegaly noted..  Neuro: No focal neurological deficits noted cranial nerves II through XII grossly intact. DTRs 2+ bilaterally upper and lower extremities. Strength functional in bilateral upper and lower extremities.  Musculoskeletal: Full range of motion bilaterally symmetrically. No tenderness Extremities: No edema, no cyanosis or clubbing. Psychiatric: Appropriate.   Labs on Admission:  Results for orders placed during the hospital encounter of 10/25/11 (from the past 48 hour(s))  CARDIAC PANEL(CRET KIN+CKTOT+MB+TROPI)     Status: Normal   Collection Time   10/25/11  4:16 PM      Component Value Range Comment   Total CK 41  7 - 177 (U/L)    CK, MB 2.9  0.3 - 4.0 (ng/mL)    Troponin I <0.30  <0.30 (ng/mL)    Relative Index RELATIVE INDEX IS INVALID  0.0 - 2.5    CBC     Status: Abnormal   Collection Time   10/25/11  4:17 PM      Component Value Range Comment   WBC 9.8  4.0 - 10.5 (K/uL)    RBC 3.37 (*) 3.87 - 5.11 (MIL/uL)    Hemoglobin 11.2 (*) 12.0 - 15.0 (g/dL)    HCT  09.8 (*) 11.9 - 46.0 (%)    MCV 101.8 (*) 78.0 - 100.0 (fL)    MCH 33.2  26.0 - 34.0 (pg)    MCHC 32.7  30.0 - 36.0 (g/dL)    RDW 14.7  82.9 - 56.2 (%)    Platelets 320  150 - 400 (K/uL)   DIFFERENTIAL     Status: Normal   Collection Time   10/25/11  4:17 PM      Component Value Range Comment   Neutrophils Relative 61  43 - 77 (%)    Neutro Abs 6.0  1.7 - 7.7 (K/uL)    Lymphocytes Relative 27  12 - 46 (%)    Lymphs Abs 2.7  0.7 - 4.0 (K/uL)    Monocytes Relative 8  3 - 12 (%)    Monocytes Absolute 0.7  0.1 - 1.0 (K/uL)    Eosinophils Relative 4  0 - 5 (%)    Eosinophils Absolute 0.4  0.0 - 0.7 (K/uL)    Basophils Relative 1  0 - 1 (%)    Basophils Absolute 0.1  0.0 - 0.1 (K/uL)   BASIC METABOLIC PANEL     Status: Abnormal   Collection Time   10/25/11  4:17 PM      Component Value Range Comment   Sodium 141  135 - 145 (mEq/L)    Potassium 3.4 (*) 3.5 - 5.1 (mEq/L)    Chloride 105  96 - 112 (mEq/L)    CO2 26  19 - 32 (mEq/L)    Glucose, Bld 146 (*) 70 - 99 (mg/dL)    BUN 9  6 - 23 (mg/dL)    Creatinine, Ser 1.61  0.50 - 1.10 (mg/dL)    Calcium 9.5  8.4 - 10.5 (mg/dL)    GFR calc non Af Amer 75 (*) >90 (mL/min)    GFR calc Af Amer 87 (*) >90 (mL/min)   PROTIME-INR     Status: Normal   Collection Time   10/25/11  4:17 PM      Component Value Range Comment   Prothrombin Time 13.8  11.6 - 15.2 (seconds)    INR 1.04  0.00 - 1.49    PRO B NATRIURETIC PEPTIDE     Status: Normal   Collection Time   10/25/11  4:21 PM      Component Value Range Comment   Pro B Natriuretic peptide (BNP) 320.0  0 - 450 (pg/mL)     Radiological Exams on Admission: No results found.  Assessment/Plan 1-Hospital acquired PNA: Will start the patient on vancomycin and cefepime (secondary to penicillin and sulfa allergies) broad-spectrum antibiotics for healthcare assess her pneumonia. Will use antipyretics as needed, provide gentle fluid resuscitation and follow cultures which are pending at this  point. For cough will use Mucinex. Chest x-ray 2 views has been order after fluid resuscitation;  in order to follow on the patient's shortness of breath and airspace disease. Will start patient on incentive spirometry.  2-COPD (chronic obstructive pulmonary disease) with emphysema: Patient quit smoking, at this point no using any inhalers on no complaining of wheezing. Will use albuterol as needed. She will receive antibiotics for further treatment for her Hospital acquired pneumonia.  3-Diabetes mellitus type II, controlled: Hemoglobin A1c has been order; patient with a remote history of diabetes in order to not taking any hypoglycemic agent. Base of her hemoglobin A1c results we'll determine if she needs a no any further medications. For now on will follow just a heart healthy/low carbohydrate diet.  4-Parotid mass: Patient will follow as an outpatient with Dr. Pollyann Kennedy for further evaluation and treatment of her parotid mass.  5-Hypothyroidism: Will check TSH, continue Synthroid.  6-Hypokalemia: Will replete potassium and check a magnesium/phosphorus level. BMET in a.m.  7-SOB (shortness of breath): As a sitter with her Hospital acquired pneumonia. At this point will just start patient on broad-spectrum antibiotics and use albuterol as needed.   8-Depression and Anxiety: Continue Wellbutrin and Elavil for her depression. Alprazolam as needed for her anxiety.  9-HTN: Continue lisinopril and Lasix. Blood pressure stable and well controlled.  10-DVT: Will use Lovenox  11-deconditioning. Will ask physical therapy and occupational therapy to a lot of the patient lives  in their recommendations will plan appropriate discharge for her.   Time Spent on Admission: 55 minutes  Donnesha Karg [facility] [phone]  10/25/2011, 6:34 PM

## 2011-10-25 NOTE — ED Notes (Signed)
Received pt via EMS from urgent care with c/o sob and nausea that has resolved upon arrival to ER. Pt states she started having SOB yesterday evening. Symptoms are intermittent. Pt had iv placed and medicated with Zofran pta per urgent care. Pt states she feels better and and denies sob and nausea at this time

## 2011-10-25 NOTE — Progress Notes (Signed)
ANTIBIOTIC CONSULT NOTE - INITIAL  Pharmacy Consult for Vancomycin,Cefepime Indication: rule out pneumonia  Allergies  Allergen Reactions  . Contrast Media (Iodinated Diagnostic Agents) Anaphylaxis  . Sulfa Antibiotics Anaphylaxis  . Iohexol      Desc: PT STATES SHE HAD A SEVERE REACTION TO IV CONRAST WITH THROAT SWELLING AND SOB. SHE WAS ADMITTED TO THE HOSPITAL. SHE HAS NEVER HAD CONTRAST AGAIN.   Marland Kitchen Penicillins Rash    Patient Measurements: Height: 5\' 5"  (165.1 cm) Weight: 161 lb (73.029 kg) IBW/kg (Calculated) : 57    Vital Signs: Temp: 98.1 F (36.7 C) (12/14 1959) Temp src: Oral (12/14 1959) BP: 164/80 mmHg (12/14 1959) Pulse Rate: 80  (12/14 1959) Intake/Output from previous day:   Intake/Output from this shift:    Labs:  [facility] 10/25/11 1617  WBC 9.8  HGB 11.2*  PLT 320  LABCREA --  CREATININE 0.79   Estimated Creatinine Clearance: 54.3 ml/min (by C-G formula based on Cr of 0.79). No results found for this basename: VANCOTROUGH:2,VANCOPEAK:2,VANCORANDOM:2,GENTTROUGH:2,GENTPEAK:2,GENTRANDOM:2,TOBRATROUGH:2,TOBRAPEAK:2,TOBRARND:2,AMIKACINPEAK:2,AMIKACINTROU:2,AMIKACIN:2, in the last 72 hours   Microbiology: No results found for this or any previous visit (from the past 720 hour(s)).  Medical History: Past Medical History  Diagnosis Date  . Diabetes mellitus   . Thyroid disease   . Abdominal aneurysm   . COPD (chronic obstructive pulmonary disease)   . Hypertension   . Shortness of breath   . Hypothyroidism   . Depression   . Recurrent upper respiratory infection (URI)   . Seizures   . Ejection fraction     EF 40%, echo, September 20, 2011, severe hypokinesis of the entire apex  . Abnormal EKG     November, 2012  . Contrast media allergy     Patient feels poorly with contrast    Medications:  Prescriptions prior to admission  Medication Sig Dispense Refill  . ALPRAZolam (XANAX) 0.5 MG tablet Take 0.5-1 mg by mouth 2 (two) times daily. Takes 1  tab in the am & 2 tabs at night       . amitriptyline (ELAVIL) 10 MG tablet Take 10 mg by mouth at bedtime.        Marland Kitchen aspirin 325 MG tablet Take 325 mg by mouth daily.        Marland Kitchen buPROPion (WELLBUTRIN XL) 300 MG 24 hr tablet Take 300 mg by mouth every morning.        . calcium carbonate (OS-CAL) 600 MG TABS Take 600 mg by mouth daily.        . carbamazepine (TEGRETOL) 100 MG chewable tablet Chew 100-200 mg by mouth 2 (two) times daily. PT TAKES 2 TABS IN THE MORNING & 1 TAB AT NIGHT. HOWEVER THE DIRECTIONS ON THE BOTTLE STATE TO TAKE 4 TABS IN THE MORNING & 2 TABS AT NIGHT.       Marland Kitchen Cholecalciferol (VITAMIN D3) 5000 UNITS CAPS Take 1 capsule by mouth daily.        . furosemide (LASIX) 20 MG tablet Take 20 mg by mouth every morning.        Marland Kitchen levothyroxine (SYNTHROID, LEVOTHROID) 150 MCG tablet Take 150 mcg by mouth every morning.        Marland Kitchen lisinopril (PRINIVIL,ZESTRIL) 5 MG tablet Take 1 tablet (5 mg total) by mouth daily.  30 tablet  0  . OVER THE COUNTER MEDICATION Take 1 capsule by mouth every evening. LUTEIN 45MG  WITH ZEAXANTHIN       . ondansetron (ZOFRAN-ODT) 8 MG disintegrating tablet Take 8 mg by  mouth every 8 (eight) hours as needed. FOR NAUSEA        Assessment: 77 yo being admitted with shortness of breath. Noted recent discharge from the hospital. CXR today worrisome for PNA. Received orders for empiric, broad spectrum abx given recent health care exposure. Noted rash to PCN- will ask MD if want to proceed given 10% risk of cross sensitivity or change to Azactam- will proceed with Cefepime per MD confirmation.  Goal of Therapy:  Vancomycin trough level 15-20 mcg/ml  Plan: 1. Cefepime 1gm IV q12, start now- noted dose in ED not given. 2. Vancomycin 750mg  IV q 12- start in AM 12/15, noted ED dose given at 6pm (1gm). 3. Will monitor cx/sens, renal fn and clinical status daily.   Virda Betters K. Allena Katz, PharmD, BCPS.  Clinical Pharmacist Pager [phone]. 10/25/2011 8:14 PM

## 2011-10-25 NOTE — ED Notes (Signed)
[id number] ready

## 2011-10-25 NOTE — ED Notes (Signed)
Report called to Delcine RN

## 2011-10-25 NOTE — ED Provider Notes (Signed)
History     CSN: [id number] Arrival date & time: 10/25/2011  2:40 PM   First MD Initiated Contact with Patient 10/25/11 1447      Chief Complaint  Patient presents with  . Shortness of Breath    (Consider location/radiation/quality/duration/timing/severity/associated sxs/prior treatment) HPI Comments: Paula [name] presents for evaluation of sudden onset of dyspnea last evening at rest, she denies chest pain or other symptoms. Today, she is nauseas at [facility]. She reports a recent hospitalization with CVA and MI. She reports hx of HTN and DM. Patient is a 77 y.o. female presenting with shortness of breath.  Shortness of Breath  The current episode started yesterday. The problem has been unchanged. The symptoms are relieved by nothing. The symptoms are aggravated by nothing. Associated symptoms include shortness of breath. Pertinent negatives include no chest pain, no chest pressure, no fever and no cough.    Past Medical History  Diagnosis Date  . Diabetes mellitus   . Thyroid disease   . Abdominal aneurysm   . COPD (chronic obstructive pulmonary disease)   . Hypertension   . Shortness of breath   . Hypothyroidism   . Depression   . Recurrent upper respiratory infection (URI)   . Seizures   . Ejection fraction     NL EF 10/2011 echo  . Abnormal EKG     November, 2012  . Contrast media allergy     Patient feels poorly with contrast  . AAA (abdominal aortic aneurysm)     10/2011 - 5.4cm - scheduled to f/u Dr. Hart [place]  . Pneumonia     10/2011  . Pre-syncope     vasovagal w/ heart block  . Parotid mass     has refused further eval 10/2011  . Chest pain at rest     Myoview 10/14/2011: ef 60%, ? dist anteroseptal scar w/ sublte peri-infarct ischemia vs. attenuation  . Hyperthyroidism   . CHF (congestive heart failure)   . GERD (gastroesophageal reflux disease)   . Headache   . Arthritis   . Anxiety   . Dysrhythmia   . Myocardial infarction     Past Surgical History  Procedure  Date  . Cholecystectomy   . Joint replacement   . Sinus surgery with instatrak   . Appendectomy   . Oophorectomy   . Arthroscopic repair acl   . Cardiac catheterization   . Eye surgery     Family History  Problem Relation Age of Onset  . Cancer Father     History  Substance Use Topics  . Smoking status: Former Smoker    Quit date: 08/31/2011  . Smokeless tobacco: Not on file  . Alcohol Use: No    OB History    Grav Para Term Preterm Abortions TAB SAB Ect Mult Living                  Review of Systems  Constitutional: Negative for fever, chills, diaphoresis and fatigue.  HENT: Negative.   Eyes: Negative.   Respiratory: Positive for shortness of breath. Negative for cough and chest tightness.   Cardiovascular: Negative for chest pain and palpitations.  Gastrointestinal: Positive for nausea. Negative for vomiting and abdominal pain.  Musculoskeletal: Negative.   Skin: Negative.     Allergies  Contrast media; Iohexol; Penicillins; and Sulfa antibiotics  Home Medications   Current Outpatient Rx  Name Route Sig Dispense Refill  . ALPRAZOLAM 0.5 MG PO TABS Oral Take 0.5-1 mg by mouth 2 (two) times daily. Takes  1 tab in the am & 2 tabs at night    . ASPIRIN 325 MG PO TABS Oral Take 325 mg by mouth daily.      . BUPROPION HCL ER (XL) 300 MG PO TB24 Oral Take 300 mg by mouth every morning.      Marland Kitchen VITAMIN D3 5000 UNITS PO CAPS Oral Take 1 capsule by mouth daily.      . FUROSEMIDE 20 MG PO TABS Oral Take 20 mg by mouth every morning.      . GUAIFENESIN ER 600 MG PO TB12 Oral Take 1 tablet (600 mg total) by mouth 2 (two) times daily. 30 tablet 0  . LEVOTHYROXINE SODIUM 150 MCG PO TABS Oral Take 150 mcg by mouth every morning.      Marland Kitchen LISINOPRIL 5 MG PO TABS Oral Take 1 tablet (5 mg total) by mouth daily. 30 tablet 0    BP 165/89  Pulse 103  Temp(Src) 99.3 F (37.4 C) (Oral)  Resp 20  SpO2 98%  Physical Exam  Nursing note and vitals reviewed. Constitutional: She is  oriented to person, place, and time. She appears well-developed and well-nourished.  HENT:  Head: Normocephalic and atraumatic.  Right Ear: External ear normal.  Left Ear: External ear normal.  Eyes: EOM are normal. Pupils are equal, round, and reactive to light.  Neck: Normal range of motion.  Cardiovascular: Normal rate, regular rhythm and normal heart sounds.   Pulmonary/Chest: Effort normal and breath sounds normal.  Abdominal: Soft. Bowel sounds are normal. There is no tenderness.  Musculoskeletal: Normal range of motion.  Neurological: She is alert and oriented to person, place, and time. She has normal strength. No cranial nerve deficit or sensory deficit.  Skin: Skin is warm and dry.  Psychiatric: Her behavior is normal.    ED Course  Procedures (including critical care time)  Labs Reviewed - No data to display No results found.   1. Dyspnea       MDM  Transferred to [facility] Emergency Department.        Richardo Priest, MD 11/12/11 1544

## 2011-10-25 NOTE — ED Provider Notes (Signed)
History     CSN: [id number] Arrival date & time: 10/25/2011  3:45 PM   First MD Initiated Contact with Patient 10/25/11 1606      Chief Complaint  Patient presents with  . Shortness of Breath    (Consider location/radiation/quality/duration/timing/severity/associated sxs/prior treatment) HPI Comments: Patient presents for shortness of breath onset last night and continued on today. She denies any cough or center baseline. She denies any fevers or chest pain. She denies any leg pain or swelling. She denies any nausea, vomiting or abdominal pain. She was recently in the hospital for facial twitching. She did have a workup from a cardiological standpoint due to her abnormal EKG. She had a normal systolic ejection fraction echocardiogram and a Myoview scan that showed an anterior area of scar.  She states she's not been very mobile since she left the hospital and has a reduced appetite. Her breathing worsens with exertion.  The history is provided by the patient and the EMS personnel.    Past Medical History  Diagnosis Date  . Diabetes mellitus   . Thyroid disease   . Abdominal aneurysm   . COPD (chronic obstructive pulmonary disease)   . Hypertension   . Shortness of breath   . Hypothyroidism   . Depression   . Recurrent upper respiratory infection (URI)   . Seizures   . Ejection fraction     EF 40%, echo, September 20, 2011, severe hypokinesis of the entire apex  . Abnormal EKG     November, 2012  . Contrast media allergy     Patient feels poorly with contrast    Past Surgical History  Procedure Date  . Cholecystectomy   . Joint replacement   . Sinus surgery with instatrak   . Appendectomy   . Oophorectomy   . Arthroscopic repair acl     Family History  Problem Relation Age of Onset  . Cancer Father     History  Substance Use Topics  . Smoking status: Former Smoker    Quit date: 08/31/2011  . Smokeless tobacco: Not on file  . Alcohol Use: No    OB History      Grav Para Term Preterm Abortions TAB SAB Ect Mult Living                  Review of Systems  Constitutional: Positive for activity change, appetite change and fatigue. Negative for fever.  Respiratory: Positive for cough, chest tightness and shortness of breath.   Cardiovascular: Negative for chest pain, palpitations and leg swelling.  Gastrointestinal: Negative for nausea, vomiting and abdominal pain.  Genitourinary: Negative for dysuria and hematuria.  Musculoskeletal: Negative for back pain.  Skin: Negative for rash.  Neurological: Positive for weakness. Negative for dizziness, light-headedness, numbness and headaches.    Allergies  Contrast media; Sulfa antibiotics; Iohexol; and Penicillins  Home Medications   Current Outpatient Rx  Name Route Sig Dispense Refill  . ALPRAZOLAM 0.5 MG PO TABS Oral Take 0.5-1 mg by mouth 2 (two) times daily. Takes 1 tab in the am & 2 tabs at night     . AMITRIPTYLINE HCL 10 MG PO TABS Oral Take 10 mg by mouth at bedtime.      . ASPIRIN 325 MG PO TABS Oral Take 325 mg by mouth daily.      . BUPROPION HCL ER (XL) 300 MG PO TB24 Oral Take 300 mg by mouth every morning.      Marland Kitchen CALCIUM CARBONATE 600 MG PO  TABS Oral Take 600 mg by mouth daily.      Marland Kitchen CARBAMAZEPINE 100 MG PO CHEW Oral Chew 100-200 mg by mouth 2 (two) times daily. PT TAKES 2 TABS IN THE MORNING & 1 TAB AT NIGHT. HOWEVER THE DIRECTIONS ON THE BOTTLE STATE TO TAKE 4 TABS IN THE MORNING & 2 TABS AT NIGHT.     Marland Kitchen VITAMIN D3 5000 UNITS PO CAPS Oral Take 1 capsule by mouth daily.      . FUROSEMIDE 20 MG PO TABS Oral Take 20 mg by mouth every morning.      Marland Kitchen LEVOTHYROXINE SODIUM 150 MCG PO TABS Oral Take 150 mcg by mouth every morning.      Marland Kitchen LISINOPRIL 5 MG PO TABS Oral Take 1 tablet (5 mg total) by mouth daily. 30 tablet 0  . OVER THE COUNTER MEDICATION Oral Take 1 capsule by mouth every evening. LUTEIN 45MG  WITH ZEAXANTHIN     . ONDANSETRON 8 MG PO TBDP Oral Take 8 mg by mouth every 8  (eight) hours as needed. FOR NAUSEA       BP 149/84  Pulse 95  Temp(Src) 98.7 F (37.1 C) (Oral)  Resp 18  SpO2 97%  Physical Exam  Constitutional: She is oriented to person, place, and time. She appears well-developed and well-nourished. No distress.  HENT:  Head: Normocephalic and atraumatic.  Mouth/Throat: Oropharynx is clear and moist. No oropharyngeal exudate.  Eyes: Conjunctivae are normal. Pupils are equal, round, and reactive to light.  Neck: Normal range of motion. Neck supple.  Cardiovascular: Normal rate, regular rhythm and normal heart sounds.   No murmur heard. Pulmonary/Chest: Effort normal and breath sounds normal. No respiratory distress.  Abdominal: Soft. There is no tenderness. There is no rebound and no guarding.  Musculoskeletal: Normal range of motion. She exhibits no edema and no tenderness.  Neurological: She is alert and oriented to person, place, and time. No cranial nerve deficit.  Skin: Skin is warm.    ED Course  Procedures (including critical care time)  Labs Reviewed  CBC - Abnormal; Notable for the following:    RBC 3.37 (*)    Hemoglobin 11.2 (*)    HCT 34.3 (*)    MCV 101.8 (*)    All other components within normal limits  BASIC METABOLIC PANEL - Abnormal; Notable for the following:    Potassium 3.4 (*)    Glucose, Bld 146 (*)    GFR calc non Af Amer 75 (*)    GFR calc Af Amer 87 (*)    All other components within normal limits  CARDIAC PANEL(CRET KIN+CKTOT+MB+TROPI)  DIFFERENTIAL  PROTIME-INR  PRO B NATRIURETIC PEPTIDE  URINALYSIS, ROUTINE W REFLEX MICROSCOPIC  URINE CULTURE  STREP PNEUMONIAE URINARY ANTIGEN  LEGIONELLA ANTIGEN, URINE   Dg Chest Portable 1 View  10/25/2011  *RADIOLOGY REPORT*  Clinical Data: Shortness of breath.  PORTABLE CHEST - 1 VIEW  Comparison: Plain film of the chest 03/12/2010, 09/15/2011 and 10/05/2011.  Findings: The patient has a small left effusion and left basilar airspace disease.  Right lung appears  clear.  Chest is hyperexpanded.  Heart size is upper normal.  IMPRESSION: Small left effusion and basilar airspace disease worrisome for pneumonia.  Recommend follow-up plain films to clearing.  Original Report Authenticated By: Bernadene Bell. D'ALESSIO, M.D.     1. HCAP (healthcare-associated pneumonia)       MDM  Patient presents with 24 hours of shortness of breath and cough.  She was admitted  10 days ago for facial tingling in EKG changes. She is in no distress at this time with clear lungs.   Date: 10/25/2011  Rate: 92  Rhythm: normal sinus rhythm  QRS Axis: normal  Intervals: PR prolonged  ST/T Wave abnormalities: nonspecific ST/T changes  Conduction Disutrbances:none  Narrative Interpretation: Anterior T-wave inversions and ST depression consistent with LVH unchanged from previous  Old EKG Reviewed: unchanged  Left-sided basilar infiltrate worrisome for pneumonia. Patient was recently hospitalized we'll treat her hospital acquired pneumonia.  Her vitals are stable she continues to have minimal oxygen requirement.      Glynn Octave, MD 10/25/11 [phone]

## 2011-10-25 NOTE — ED Notes (Signed)
hospitalist in room assessing pt, iv abx started after urine obtained and sent for culture

## 2011-10-26 ENCOUNTER — Inpatient Hospital Stay (HOSPITAL_COMMUNITY): Payer: Medicare Other

## 2011-10-26 LAB — BASIC METABOLIC PANEL
CO2: 28 mEq/L (ref 19–32)
Calcium: 9.3 mg/dL (ref 8.4–10.5)
Creatinine, Ser: 0.82 mg/dL (ref 0.50–1.10)
Glucose, Bld: 106 mg/dL — ABNORMAL HIGH (ref 70–99)
Sodium: 141 mEq/L (ref 135–145)

## 2011-10-26 LAB — CBC
HCT: 33.3 % — ABNORMAL LOW (ref 36.0–46.0)
MCH: 32.9 pg (ref 26.0–34.0)
MCV: 103.4 fL — ABNORMAL HIGH (ref 78.0–100.0)
Platelets: 312 10*3/uL (ref 150–400)
RBC: 3.22 MIL/uL — ABNORMAL LOW (ref 3.87–5.11)

## 2011-10-26 LAB — PRO B NATRIURETIC PEPTIDE: Pro B Natriuretic peptide (BNP): 418 pg/mL (ref 0–450)

## 2011-10-26 LAB — CARDIAC PANEL(CRET KIN+CKTOT+MB+TROPI)
Relative Index: INVALID (ref 0.0–2.5)
Total CK: 31 U/L (ref 7–177)
Troponin I: <0.3 ng/mL (ref <0.30)

## 2011-10-26 MED ORDER — POTASSIUM CHLORIDE 10 MEQ/100ML IV SOLN
10.0000 meq | INTRAVENOUS | Status: AC
  Administered 2011-10-26 (×2): 10 meq via INTRAVENOUS
  Filled 2011-10-26 (×3): qty 100

## 2011-10-26 MED ORDER — ALPRAZOLAM 0.5 MG PO TABS: 1.0000 mg | ORAL_TABLET | Freq: Two times a day (BID) | ORAL | Status: DC

## 2011-10-26 MED ORDER — POTASSIUM CHLORIDE CRYS ER 20 MEQ PO TBCR
EXTENDED_RELEASE_TABLET | ORAL | Status: AC
  Filled 2011-10-26: qty 2

## 2011-10-26 MED ORDER — XENON XE 133 GAS
10.0000 | GAS_FOR_INHALATION | Freq: Once | RESPIRATORY_TRACT | Status: AC | PRN
  Administered 2011-10-26: 10 via RESPIRATORY_TRACT

## 2011-10-26 MED ORDER — TECHNETIUM TO 99M ALBUMIN AGGREGATED
6.0000 | Freq: Once | INTRAVENOUS | Status: AC | PRN
  Administered 2011-10-26: 6 via INTRAVENOUS

## 2011-10-26 MED ORDER — ALPRAZOLAM 0.5 MG PO TABS
1.0000 mg | ORAL_TABLET | Freq: Two times a day (BID) | ORAL | Status: DC
  Administered 2011-10-26 – 2011-10-28 (×5): 1 mg via ORAL
  Filled 2011-10-26 (×3): qty 1
  Filled 2011-10-26 (×2): qty 2
  Filled 2011-10-26: qty 1
  Filled 2011-10-26: qty 2

## 2011-10-26 NOTE — Progress Notes (Signed)
Paula [name]  WUJ:[id number]  DOB: [birth year]  DOA: 10/25/2011  Subjective: Complaining of smothering feeling in the chest  Objective: Weight change:   Intake/Output Summary (Last 24 hours) at 10/26/11 1447 Last data filed at 10/26/11 1420  Gross per 24 hour  Intake 1567.5 ml  Output      0 ml  Net 1567.5 ml   Blood pressure 152/83, pulse 89, temperature 99.2 F (37.3 C), temperature source Oral, resp. rate 20, height 5\' 5"  (1.651 m), weight 72.5 kg (159 lb 13.3 oz), SpO2 98.00%.  Physical Exam: General: Alert and awake, oriented x3, not in any acute distress. HEENT: anicteric sclera, pupils reactive to light and accommodation, EOMI CVS: S1-S2 clear, no murmur rubs or gallops Chest: clear to auscultation bilaterally, no wheezing, rales or rhonchi Abdomen: soft nontender, nondistended, normal bowel sounds, no organomegaly Extremities: no cyanosis, clubbing or edema noted bilaterally Neuro: Cranial nerves II-XII intact, no focal neurological deficits  Lab Results: Basic Metabolic Panel:  Lab 10/26/11 9562 10/25/11 2003 10/25/11 1617  NA 141 -- 141  K 3.6 -- 3.4*  CL 104 -- 105  CO2 28 -- 26  GLUCOSE 106* -- 146*  BUN 7 -- 9  CREATININE 0.82 -- 0.79  CALCIUM 9.3 -- 9.5  MG -- 2.3 --  PHOS -- 3.0 --   Liver Function Tests:  Lab 10/25/11 2003  AST 13  ALT 10  ALKPHOS 61  BILITOT 0.3  PROT 6.0  ALBUMIN 3.4*   CBC:  Lab 10/26/11 0600 10/25/11 2003 10/25/11 1617  WBC 8.5 9.6 --  NEUTROABS -- -- 6.0  HGB 10.6* 11.2* --  HCT 33.3* 34.4* --  MCV 103.4* 102.4* --  PLT 312 322 --   Cardiac Enzymes:  Lab 10/26/11 1057 10/25/11 1616  CKTOTAL 31 41  CKMB 2.5 2.9  CKMBINDEX -- --  TROPONINI <0.30 <0.30   BNP: No components found with this basename: POCBNP:2 CBG:  Lab 10/26/11 0808  GLUCAP 114*      Studies/Results: X-ray Chest Pa And Lateral   10/26/2011  *RADIOLOGY REPORT*  Clinical Data: Shortness of breath.  CHEST - 2 VIEW  Comparison: Chest x-ray  10/25/2011.  Findings: The cardiac silhouette, mediastinal and hilar contours are stable.  There is tortuosity and mild ectasia of the thoracic aorta.  The left lower lobe process has improved.  Minimal residual atelectasis and a small pleural effusion are noted.  Stable pulmonary nodule in the left mid lung.  IMPRESSION: Improving left lower lobe process.  Original Report Authenticated By: P. Loralie Champagne, M.D.   Dg Chest 2 View  10/05/2011  *RADIOLOGY REPORT*  Clinical Data: Shortness of breath.  History of aortic aneurysm. Recently hospitalized for hypertension.  Ex-smoker as of 6 weeks ago.  CHEST - 2 VIEW  Comparison: 09/19/2011  Findings: Degraded lateral view secondary patient positioning. Underlying hyperinflation.  Mild osteopenia.  Cardiomegaly with atherosclerosis in the transverse aorta.  A small left pleural effusion is decreased since 09/19/2011. No pneumothorax.  Biapical pleural thickening. No congestive failure.  Mild bibasilar volume loss.  IMPRESSION:  1.  Cardiomegaly and hyperinflation, without congestive failure. 2.  Small left pleural effusion.  Original Report Authenticated By: Consuello Bossier, M.D.   Ct Head Wo Contrast  10/12/2011  *RADIOLOGY REPORT*  Clinical Data: Facial droop.  CT HEAD WITHOUT CONTRAST  Technique:  Contiguous axial images were obtained from the base of the skull through the vertex without contrast.  Comparison: CT of the head performed 09/19/2011  Findings:  There is no evidence of acute infarction, mass lesion, or intra- or extra-axial hemorrhage on CT.  Prominence of the ventricles and sulci reflects mild to moderate cortical volume loss.  The degree of ventricular prominence appears grossly stable from multiple prior studies.  Periventricular and subcortical white matter change likely reflects small vessel ischemic microangiopathy.  The posterior fossa, including the cerebellum, brainstem and fourth ventricle, is within normal limits.  The basal ganglia are  unremarkable in appearance.  The cerebral hemispheres demonstrate grossly normal gray-white differentiation.  No mass effect or midline shift is seen.  There is no evidence of fracture; visualized osseous structures are unremarkable in appearance.  The visualized portions of the orbits are within normal limits.  The paranasal sinuses and mastoid air cells are well-aerated.  No significant soft tissue abnormalities are seen.  IMPRESSION:  1.  No acute intracranial pathology seen on CT. 2.  Mild to moderate cortical volume loss and diffuse small vessel ischemic microangiopathy.  Original Report Authenticated By: Tonia Ghent, M.D.   Mri Brain Without Contrast  10/12/2011  *RADIOLOGY REPORT*  Clinical Data:  Bilateral facial twitching, left greater than right.  History of diabetes and hypertension.  MRI HEAD WITHOUT CONTRAST MRA HEAD WITHOUT CONTRAST  Technique:  Multiplanar, multiecho pulse sequences of the brain and surrounding structures were obtained without intravenous contrast. Angiographic images of the head were obtained using MRA technique without contrast.  Comparison:  CT head earlier in the day.  MRI brain 09/18/2011.  MRI HEAD  Findings:  There is no evidence for acute infarction, intracranial hemorrhage, mass lesion, hydrocephalus, or extra-axial fluid. There is moderate atrophy with chronic microvascular ischemic change. Dolichoectatic but otherwise widely patent intracranial vasculature.  Scattered stable bifrontal micro hemorrhage is likely secondary to hypertensive cerebrovascular disease.  Incompletely evaluated but persistent diminished T2 signal deep lobe parotid mass with coronal cross-sectional measurements of 17 x 13 mm.  This is statistically indeterminate for a benign or malignant lesion, but the low T2 signal can portend a more ominous process.  Recommend MRI of the neck without and with contrast.  IMPRESSION: No acute intracranial findings.  Persistent atrophy and small vessel disease.   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  Recommend further evaluation with MRI of the neck without and with contrast.  MRA HEAD  Findings: Widely patent but dolichoectatic carotid and basilar arteries.  Left greater than right vertebral arteries supply the basilar without visible stenosis.  The anterior, middle and posterior cerebral arteries are all widely patent. There is no visible intracranial aneurysm.  Mild irregularity of the distal MCA and PCA branches suggest intracranial atherosclerotic change secondary to hypertension or diabetes.  IMPRESSION: No proximal flow limiting stenosis.  Moderate dolichoectasia and mild distal irregularity.  Original Report Authenticated By: Elsie Stain, M.D.   Nm Myocar Multi W/spect W/wall Motion / Ef  10/14/2011  *RADIOLOGY REPORT*  Clinical Data:  Chest pain.  Abnormal EKG.  Shortness of breath. COPD.  MYOCARDIAL IMAGING WITH SPECT (REST AND PHARMACOLOGIC-STRESS) GATED LEFT VENTRICULAR WALL MOTION STUDY LEFT VENTRICULAR EJECTION FRACTION  Technique:  Standard myocardial SPECT imaging was performed after resting intravenous injection of 10 mCi Tc-5m tetrofosmin. Subsequently, intravenous infusion of Lexiscan  was performed under the supervision of the Cardiology staff.  At peak effect of the drug, 30 mCi Tc-73m tetrofosmin was injected intravenously and standard myocardial SPECT  imaging was performed.  Quantitative gated imaging was also performed to evaluate left ventricular wall motion, and estimate left ventricular ejection  fraction.  Comparison:  None.  Findings: Multiplanar SPECT imaging shows grossly normal left ventricular cavity size.  There is a small fixed defect in the distal anteroseptal wall which is slightly more prominent on stress images than rest.  No other fixed or reversible perfusion defects are evident.  Images obtained with cardiac gating displayed using a surface rendering algorithm show no segmental wall motion abnormality.  Left  ventricular end-diastolic volume is calculated at 76 ml.  Left ventricular end systolic volume is calculated at 30 ml.  The derived left ventricular ejection fraction is 60%.  IMPRESSION: Small fixed defect in the distal anteroseptal wall which becomes slightly more prominent on stress.  This is probably related to breast attenuation with slight differential breast positioning between the rest and stress images, but a small scar with subtle peri infarct ischemia cannot be completely excluded.  Grossly normal left ventricular cavity size with normal left ventricular ejection fraction.  Original Report Authenticated By: ERIC A. MANSELL, M.D.   Nm Pulmonary Per & Vent  10/05/2011  *RADIOLOGY REPORT*  Clinical Data:  Shortness of breath, increased D-dimer, contrast allergy  NUCLEAR MEDICINE VENTILATION - PERFUSION LUNG SCAN  Technique:  Wash-in, equilibrium, and wash-out phase ventilation images were obtained using Xe-133 gas.  Perfusion images were obtained in multiple projections after intravenous injection of Tc- 57m MAA.  Radiopharmaceuticals:  10 mCi Xe-133 gas and 6.0 mCi Tc-36m MAA.  Comparison:  None Correlation:  Chest radiograph 10/05/2011  Findings: Ventilation exam in anterior and posterior projections demonstrates a slightly smaller left lung than right. Minimal nonsegmental diminished ventilation at left lung base. No significant xenon retention.  Perfusion lung scan in eight projections shows slightly smaller left lung than right. Minimal nonsegmental diminished perfusion at left lung base. No segmental or subsegmental perfusion defects identified.  Chest radiograph is significant for cardiomegaly and small left pleural effusion.  IMPRESSION: Minimal nonsegmental diminished perfusion and ventilation at left lung base, likely related to small left pleural effusion. Findings represent a very low probability pulmonary embolism.  Original Report Authenticated By: Lollie Marrow, M.D.   Dg Chest Portable  1 View  10/25/2011  *RADIOLOGY REPORT*  Clinical Data: Shortness of breath.  PORTABLE CHEST - 1 VIEW  Comparison: Plain film of the chest 03/12/2010, 09/15/2011 and 10/05/2011.  Findings: The patient has a small left effusion and left basilar airspace disease.  Right lung appears clear.  Chest is hyperexpanded.  Heart size is upper normal.  IMPRESSION: Small left effusion and basilar airspace disease worrisome for pneumonia.  Recommend follow-up plain films to clearing.  Original Report Authenticated By: Bernadene Bell. Maricela Curet, M.D.   Mr Maxine Glenn Head/brain Wo Cm  10/12/2011  *RADIOLOGY REPORT*  Clinical Data:  Bilateral facial twitching, left greater than right.  History of diabetes and hypertension.  MRI HEAD WITHOUT CONTRAST MRA HEAD WITHOUT CONTRAST  Technique:  Multiplanar, multiecho pulse sequences of the brain and surrounding structures were obtained without intravenous contrast. Angiographic images of the head were obtained using MRA technique without contrast.  Comparison:  CT head earlier in the day.  MRI brain 09/18/2011.  MRI HEAD  Findings:  There is no evidence for acute infarction, intracranial hemorrhage, mass lesion, hydrocephalus, or extra-axial fluid. There is moderate atrophy with chronic microvascular ischemic change. Dolichoectatic but otherwise widely patent intracranial vasculature.  Scattered stable bifrontal micro hemorrhage is likely secondary to hypertensive cerebrovascular disease.  Incompletely evaluated but persistent diminished T2 signal deep lobe parotid mass with coronal cross-sectional measurements of 17 x  13 mm.  This is statistically indeterminate for a benign or malignant lesion, but the low T2 signal can portend a more ominous process.  Recommend MRI of the neck without and with contrast.  IMPRESSION: No acute intracranial findings.  Persistent atrophy and small vessel disease.  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  Recommend further evaluation with MRI of the neck  without and with contrast.  MRA HEAD  Findings: Widely patent but dolichoectatic carotid and basilar arteries.  Left greater than right vertebral arteries supply the basilar without visible stenosis.  The anterior, middle and posterior cerebral arteries are all widely patent. There is no visible intracranial aneurysm.  Mild irregularity of the distal MCA and PCA branches suggest intracranial atherosclerotic change secondary to hypertension or diabetes.  IMPRESSION: No proximal flow limiting stenosis.  Moderate dolichoectasia and mild distal irregularity.  Original Report Authenticated By: Elsie Stain, M.D.    Medications: Scheduled Meds:   . ALPRAZolam  1 mg Oral BID  . amitriptyline  10 mg Oral QHS  . aspirin  325 mg Oral Daily  . buPROPion  300 mg Oral Q0700  . calcium carbonate  1 tablet Oral Daily  . ceFEPime (MAXIPIME) IV  1 g Intravenous To Major  . ceFEPime (MAXIPIME) IV  1 g Intravenous Q12H  . cholecalciferol  5,000 Units Oral Daily  . enoxaparin  40 mg Subcutaneous Q24H  . furosemide  20 mg Oral Q0700  . guaiFENesin  600 mg Oral BID  . levothyroxine  150 mcg Oral Q0700  . lisinopril  5 mg Oral Daily  . multivitamins ther. w/minerals  1 tablet Oral Daily  . potassium chloride  10 mEq Intravenous Q1 Hr x 3  . vancomycin  750 mg Intravenous Q12H  . vancomycin  1,000 mg Intravenous Once  . DISCONTD: ALPRAZolam  0.5-1 mg Oral BID  . DISCONTD: ALPRAZolam  1 mg Oral BID  . DISCONTD: calcium carbonate  600 mg Oral Daily   Continuous Infusions:   . sodium chloride 75 mL/hr at 10/26/11 1610     Assessment/Plan: Principal Problem:  [facility] acquired PNA- continue current management, obtain CTA to rule out PE, cardiac enzyme onset negative for an acute PE Active Problems:  COPD (chronic obstructive pulmonary disease) with emphysema: Improving continue breathing treatments.  Diabetes mellitus type II, controlled: Obtain HbA1c and continue insulin  Parotid mass: Follow with Dr.  Pollyann Kennedy for further evaluation  Hypothyroidism:check TSH and continue levothyroid  Hypokalemia: Stable  SOB (shortness of breath)  Depression, anxiety: Increase Xanax to 1 mg twice a day.   LOS: 1 day   RAI,RIPUDEEP 10/26/2011, 2:47 PM

## 2011-10-26 NOTE — Progress Notes (Signed)
Physical Therapy Evaluation Patient Details Name: Paula [name] MRN: [id number] DOB: 08-05-1927 Today's Date: 10/26/2011  Problem List:  Patient Active Problem List  Diagnoses  . COPD (chronic obstructive pulmonary disease) with emphysema  . Diabetes mellitus type II, controlled  . Aneurysm of abdominal aorta  . Pneumonia, organism unspecified  . Episode of dizziness  . Parotid mass  . Hypothyroidism  . Complete heart block  . Sinus bradycardia  . Hypokalemia  . Delirium  . Suspected CHF (congestive heart failure)  . Anxiety state, unspecified  . Congestive heart failure  . Ejection fraction  . Abnormal EKG  . Contrast media allergy  . Facial twitching  . Hospital acquired PNA  . SOB (shortness of breath)  . Depression  . Anxiety    Past Medical History:  Past Medical History  Diagnosis Date  . Diabetes mellitus   . Thyroid disease   . Abdominal aneurysm   . COPD (chronic obstructive pulmonary disease)   . Hypertension   . Shortness of breath   . Hypothyroidism   . Depression   . Recurrent upper respiratory infection (URI)   . Seizures   . Ejection fraction     EF 40%, echo, September 20, 2011, severe hypokinesis of the entire apex  . Abnormal EKG     November, 2012  . Contrast media allergy     Patient feels poorly with contrast   Past Surgical History:  Past Surgical History  Procedure Date  . Cholecystectomy   . Joint replacement   . Sinus surgery with instatrak   . Appendectomy   . Oophorectomy   . Arthroscopic repair acl     PT Assessment/Plan/Recommendation PT Assessment Clinical Impression Statement: Pt. with diagnosis of PNA can benefit from PT to incr independence and safety so pt can return home with husband.  Husband very supportive and does too much for patient at home (per pt.).  Should be able to return home with husband and HHPT. PT Recommendation/Assessment: Patient will need skilled PT in the acute care venue PT Problem List: Decreased  strength;Decreased activity tolerance;Decreased balance;Decreased mobility;Decreased knowledge of use of DME PT Therapy Diagnosis : Difficulty walking;Generalized weakness PT Plan PT Frequency: Min 3X/week PT Treatment/Interventions: DME instruction;Gait training;Functional mobility training;Therapeutic exercise;Therapeutic activities;Balance training;Patient/family education PT Recommendation Follow Up Recommendations: Home health PT Equipment Recommended: None recommended by PT PT Goals  Acute Rehab PT Goals PT Goal Formulation: With patient Pt will go Sit to Stand: with modified independence PT Goal: Sit to Stand - Progress: Progressing toward goal Pt will go Stand to Sit: with modified independence PT Goal: Stand to Sit - Progress: Progressing toward goal Pt will Ambulate: 51 - 150 feet;with modified independence;with least restrictive assistive device PT Goal: Ambulate - Progress: Progressing toward goal  PT Evaluation Precautions/Restrictions    Prior Functioning  Home Living Lives With: Spouse Receives Help From: Family Type of Home: House Home Layout: One level Home Access: Stairs to enter Entrance Stairs-Rails: Right Entrance Stairs-Number of Steps: 1 Bathroom Shower/Tub: Engineer, manufacturing systems: Standard Home Adaptive Equipment: Environmental consultant - rolling;Straight cane Prior Function Level of Independence: Requires assistive device for independence;Independent with basic ADLs;Independent with transfers;Independent with gait (Pt and husband share cooking/cleaning at home.) Vocation: Retired Financial risk analyst Arousal/Alertness: Awake/alert Overall Cognitive Status: Appears within functional limits for tasks assessed Orientation Level: Oriented X4 Sensation/Coordination Coordination Gross Motor Movements are Fluid and Coordinated: Yes Extremity Assessment RLE Strength RLE Overall Strength Comments: Grossly 4/5 LLE Strength LLE Overall Strength Comments: Grossly  4/5 Mobility (including Balance) Bed Mobility Bed Mobility: Yes Supine to Sit: 6: Modified independent (Device/Increase time);With rails;HOB elevated (Comment degrees) (20 degrees) Sitting - Scoot to Edge of Bed: 6: Modified independent (Device/Increase time) Transfers Transfers: Yes Sit to Stand: 5: Supervision;From bed;From toilet Sit to Stand Details (indicate cue type and reason): Cues for hand placement. Stand to Sit: 5: Supervision;To chair/3-in-1;To toilet;With upper extremity assist;With armrests Ambulation/Gait Ambulation/Gait: Yes Ambulation/Gait Assistance: 4: Min assist Ambulation Distance (Feet): 140 Feet Assistive device: Rolling walker Gait Pattern: Trunk flexed;Decreased step length - right;Decreased step length - left Gait velocity: slow cadence  Static Standing Balance Static Standing - Balance Support: No upper extremity supported Static Standing - Level of Assistance: 5: Stand by assistance Exercise    End of Session PT - End of Session Activity Tolerance: Patient tolerated treatment well Patient left: in chair;with call bell in reach;with family/visitor present Nurse Communication: Mobility status for ambulation General Behavior During Session: [facility] for tasks performed Cognition: [facility] for tasks performed  [facility] 10/26/2011, 3:39 PM [facility] PT [phone]

## 2011-10-27 ENCOUNTER — Other Ambulatory Visit: Payer: Self-pay

## 2011-10-27 LAB — URINE CULTURE: Culture  Setup Time: 201212141852

## 2011-10-27 LAB — LEGIONELLA ANTIGEN, URINE: Legionella Antigen, Urine: NEGATIVE

## 2011-10-27 NOTE — Progress Notes (Signed)
Paula [name]  ZOX:[id number]  DOB: Jul 27, 1927  DOA: 10/25/2011  Subjective: Feeling great today, wondering if can go home. Husband at bedside.  Objective: Weight change: 1.724 kg (3 lb 12.8 oz)  Intake/Output Summary (Last 24 hours) at 10/27/11 1330 Last data filed at 10/27/11 1100  Gross per 24 hour  Intake   2555 ml  Output   1400 ml  Net   1155 ml   Blood pressure 136/81, pulse 80, temperature 98.4 F (36.9 C), temperature source Oral, resp. rate 18, height 5\' 5"  (1.651 m), weight 74.753 kg (164 lb 12.8 oz), SpO2 98.00%.  Physical Exam: General: Alert and awake, oriented x3, not in any acute distress. HEENT: anicteric sclera, pupils reactive to light and accommodation, EOMI CVS: S1-S2 clear, no murmur rubs or gallops Chest: clear to auscultation bilaterally, no wheezing, rales or rhonchi Abdomen: soft nontender, nondistended, normal bowel sounds, no organomegaly Extremities: no cyanosis, clubbing or edema noted bilaterally Neuro: Cranial nerves II-XII intact, no focal neurological deficits  Lab Results: Basic Metabolic Panel:  Lab 10/26/11 8119 10/25/11 2003 10/25/11 1617  NA 141 -- 141  K 3.6 -- 3.4*  CL 104 -- 105  CO2 28 -- 26  GLUCOSE 106* -- 146*  BUN 7 -- 9  CREATININE 0.82 -- 0.79  CALCIUM 9.3 -- 9.5  MG -- 2.3 --  PHOS -- 3.0 --   Liver Function Tests:  Lab 10/25/11 2003  AST 13  ALT 10  ALKPHOS 61  BILITOT 0.3  PROT 6.0  ALBUMIN 3.4*   CBC:  Lab 10/26/11 0600 10/25/11 2003 10/25/11 1617  WBC 8.5 9.6 --  NEUTROABS -- -- 6.0  HGB 10.6* 11.2* --  HCT 33.3* 34.4* --  MCV 103.4* 102.4* --  PLT 312 322 --   Cardiac Enzymes:  Lab 10/26/11 1057 10/25/11 1616  CKTOTAL 31 41  CKMB 2.5 2.9  CKMBINDEX -- --  TROPONINI <0.30 <0.30  CBG:  Lab 10/26/11 0808  GLUCAP 114*      Studies/Results: X-ray Chest Pa And Lateral   10/26/2011  *RADIOLOGY REPORT*  Clinical Data: Shortness of breath.  CHEST - 2 VIEW  Comparison: Chest x-ray 10/25/2011.   Findings: The cardiac silhouette, mediastinal and hilar contours are stable.  There is tortuosity and mild ectasia of the thoracic aorta.  The left lower lobe process has improved.  Minimal residual atelectasis and a small pleural effusion are noted.  Stable pulmonary nodule in the left mid lung.  IMPRESSION: Improving left lower lobe process.  Original Report Authenticated By: P. Loralie Champagne, M.D.   Dg Chest 2 View  10/05/2011  *RADIOLOGY REPORT*  Clinical Data: Shortness of breath.  History of aortic aneurysm. Recently hospitalized for hypertension.  Ex-smoker as of 6 weeks ago.  CHEST - 2 VIEW  Comparison: 09/19/2011  Findings: Degraded lateral view secondary patient positioning. Underlying hyperinflation.  Mild osteopenia.  Cardiomegaly with atherosclerosis in the transverse aorta.  A small left pleural effusion is decreased since 09/19/2011. No pneumothorax.  Biapical pleural thickening. No congestive failure.  Mild bibasilar volume loss.  IMPRESSION:  1.  Cardiomegaly and hyperinflation, without congestive failure. 2.  Small left pleural effusion.  Original Report Authenticated By: Consuello Bossier, M.D.   Ct Head Wo Contrast  10/12/2011  *RADIOLOGY REPORT*  Clinical Data: Facial droop.  CT HEAD WITHOUT CONTRAST  Technique:  Contiguous axial images were obtained from the base of the skull through the vertex without contrast.  Comparison: CT of the head performed 09/19/2011  Findings: There is no evidence of acute infarction, mass lesion, or intra- or extra-axial hemorrhage on CT.  Prominence of the ventricles and sulci reflects mild to moderate cortical volume loss.  The degree of ventricular prominence appears grossly stable from multiple prior studies.  Periventricular and subcortical white matter change likely reflects small vessel ischemic microangiopathy.  The posterior fossa, including the cerebellum, brainstem and fourth ventricle, is within normal limits.  The basal ganglia are unremarkable in  appearance.  The cerebral hemispheres demonstrate grossly normal gray-white differentiation.  No mass effect or midline shift is seen.  There is no evidence of fracture; visualized osseous structures are unremarkable in appearance.  The visualized portions of the orbits are within normal limits.  The paranasal sinuses and mastoid air cells are well-aerated.  No significant soft tissue abnormalities are seen.  IMPRESSION:  1.  No acute intracranial pathology seen on CT. 2.  Mild to moderate cortical volume loss and diffuse small vessel ischemic microangiopathy.  Original Report Authenticated By: Tonia Ghent, M.D.   Mri Brain Without Contrast  10/12/2011  *RADIOLOGY REPORT*  Clinical Data:  Bilateral facial twitching, left greater than right.  History of diabetes and hypertension.  MRI HEAD WITHOUT CONTRAST MRA HEAD WITHOUT CONTRAST  Technique:  Multiplanar, multiecho pulse sequences of the brain and surrounding structures were obtained without intravenous contrast. Angiographic images of the head were obtained using MRA technique without contrast.  Comparison:  CT head earlier in the day.  MRI brain 09/18/2011.  MRI HEAD  Findings:  There is no evidence for acute infarction, intracranial hemorrhage, mass lesion, hydrocephalus, or extra-axial fluid. There is moderate atrophy with chronic microvascular ischemic change. Dolichoectatic but otherwise widely patent intracranial vasculature.  Scattered stable bifrontal micro hemorrhage is likely secondary to hypertensive cerebrovascular disease.  Incompletely evaluated but persistent diminished T2 signal deep lobe parotid mass with coronal cross-sectional measurements of 17 x 13 mm.  This is statistically indeterminate for a benign or malignant lesion, but the low T2 signal can portend a more ominous process.  Recommend MRI of the neck without and with contrast.  IMPRESSION: No acute intracranial findings.  Persistent atrophy and small vessel disease.  Persistent deep  lobe parotid mass on the right.  Recommend further evaluation with MRI of the neck without and with contrast.  MRA HEAD  Findings: Widely patent but dolichoectatic carotid and basilar arteries.  Left greater than right vertebral arteries supply the basilar without visible stenosis.  The anterior, middle and posterior cerebral arteries are all widely patent. There is no visible intracranial aneurysm.  Mild irregularity of the distal MCA and PCA branches suggest intracranial atherosclerotic change secondary to hypertension or diabetes.  IMPRESSION: No proximal flow limiting stenosis.  Moderate dolichoectasia and mild distal irregularity.  Original Report Authenticated By: Elsie Stain, M.D.   Nm Myocar Multi W/spect W/wall Motion / Ef  10/14/2011  *RADIOLOGY REPORT*  Clinical Data:  Chest pain.  Abnormal EKG.  Shortness of breath. COPD.  MYOCARDIAL IMAGING WITH SPECT (REST AND PHARMACOLOGIC-STRESS) GATED LEFT VENTRICULAR WALL MOTION STUDY LEFT VENTRICULAR EJECTION FRACTION  Technique:  Standard myocardial SPECT imaging was performed after resting intravenous injection of 10 mCi Tc-92m tetrofosmin. Subsequently, intravenous infusion of Lexiscan  was performed under the supervision of the Cardiology staff.  At peak effect of the drug, 30 mCi Tc-21m tetrofosmin was injected intravenously and standard myocardial SPECT  imaging was performed.  Quantitative gated imaging was also performed to evaluate left ventricular wall motion, and estimate left ventricular  ejection fraction.  Comparison:  None.  Findings: Multiplanar SPECT imaging shows grossly normal left ventricular cavity size.  There is a small fixed defect in the distal anteroseptal wall which is slightly more prominent on stress images than rest.  No other fixed or reversible perfusion defects are evident.  Images obtained with cardiac gating displayed using a surface rendering algorithm show no segmental wall motion abnormality.  Left ventricular  end-diastolic volume is calculated at 76 ml.  Left ventricular end systolic volume is calculated at 30 ml.  The derived left ventricular ejection fraction is 60%.  IMPRESSION: Small fixed defect in the distal anteroseptal wall which becomes slightly more prominent on stress.  This is probably related to breast attenuation with slight differential breast positioning between the rest and stress images, but a small scar with subtle peri infarct ischemia cannot be completely excluded.  Grossly normal left ventricular cavity size with normal left ventricular ejection fraction.  Original Report Authenticated By: ERIC A. MANSELL, M.D.   Nm Pulmonary Per & Vent  10/05/2011  *RADIOLOGY REPORT*  Clinical Data:  Shortness of breath, increased D-dimer, contrast allergy  NUCLEAR MEDICINE VENTILATION - PERFUSION LUNG SCAN  Technique:  Wash-in, equilibrium, and wash-out phase ventilation images were obtained using Xe-133 gas.  Perfusion images were obtained in multiple projections after intravenous injection of Tc- 61m MAA.  Radiopharmaceuticals:  10 mCi Xe-133 gas and 6.0 mCi Tc-55m MAA.  Comparison:  None Correlation:  Chest radiograph 10/05/2011  Findings: Ventilation exam in anterior and posterior projections demonstrates a slightly smaller left lung than right. Minimal nonsegmental diminished ventilation at left lung base. No significant xenon retention.  Perfusion lung scan in eight projections shows slightly smaller left lung than right. Minimal nonsegmental diminished perfusion at left lung base. No segmental or subsegmental perfusion defects identified.  Chest radiograph is significant for cardiomegaly and small left pleural effusion.  IMPRESSION: Minimal nonsegmental diminished perfusion and ventilation at left lung base, likely related to small left pleural effusion. Findings represent a very low probability pulmonary embolism.  Original Report Authenticated By: Lollie Marrow, M.D.   Dg Chest Portable 1  View  10/25/2011  *RADIOLOGY REPORT*  Clinical Data: Shortness of breath.  PORTABLE CHEST - 1 VIEW  Comparison: Plain film of the chest 03/12/2010, 09/15/2011 and 10/05/2011.  Findings: The patient has a small left effusion and left basilar airspace disease.  Right lung appears clear.  Chest is hyperexpanded.  Heart size is upper normal.  IMPRESSION: Small left effusion and basilar airspace disease worrisome for pneumonia.  Recommend follow-up plain films to clearing.  Original Report Authenticated By: Bernadene Bell. Maricela Curet, M.D.   Mr Maxine Glenn Head/brain Wo Cm  10/12/2011  *RADIOLOGY REPORT*  Clinical Data:  Bilateral facial twitching, left greater than right.  History of diabetes and hypertension.  MRI HEAD WITHOUT CONTRAST MRA HEAD WITHOUT CONTRAST  Technique:  Multiplanar, multiecho pulse sequences of the brain and surrounding structures were obtained without intravenous contrast. Angiographic images of the head were obtained using MRA technique without contrast.  Comparison:  CT head earlier in the day.  MRI brain 09/18/2011.  MRI HEAD  Findings:  There is no evidence for acute infarction, intracranial hemorrhage, mass lesion, hydrocephalus, or extra-axial fluid. There is moderate atrophy with chronic microvascular ischemic change. Dolichoectatic but otherwise widely patent intracranial vasculature.  Scattered stable bifrontal micro hemorrhage is likely secondary to hypertensive cerebrovascular disease.  Incompletely evaluated but persistent diminished T2 signal deep lobe parotid mass with coronal cross-sectional measurements of 17  x 13 mm.  This is statistically indeterminate for a benign or malignant lesion, but the low T2 signal can portend a more ominous process.  Recommend MRI of the neck without and with contrast.  IMPRESSION: No acute intracranial findings.  Persistent atrophy and small vessel disease.  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  Recommend further evaluation with MRI of the neck  without and with contrast.  MRA HEAD  Findings: Widely patent but dolichoectatic carotid and basilar arteries.  Left greater than right vertebral arteries supply the basilar without visible stenosis.  The anterior, middle and posterior cerebral arteries are all widely patent. There is no visible intracranial aneurysm.  Mild irregularity of the distal MCA and PCA branches suggest intracranial atherosclerotic change secondary to hypertension or diabetes.  IMPRESSION: No proximal flow limiting stenosis.  Moderate dolichoectasia and mild distal irregularity.  Original Report Authenticated By: Elsie Stain, M.D.    Medications: Scheduled Meds:    . ALPRAZolam  1 mg Oral BID  . amitriptyline  10 mg Oral QHS  . aspirin  325 mg Oral Daily  . buPROPion  300 mg Oral Q0700  . calcium carbonate  1 tablet Oral Daily  . ceFEPime (MAXIPIME) IV  1 g Intravenous To Major  . ceFEPime (MAXIPIME) IV  1 g Intravenous Q12H  . cholecalciferol  5,000 Units Oral Daily  . enoxaparin  40 mg Subcutaneous Q24H  . furosemide  20 mg Oral Q0700  . guaiFENesin  600 mg Oral BID  . levothyroxine  150 mcg Oral Q0700  . lisinopril  5 mg Oral Daily  . multivitamins ther. w/minerals  1 tablet Oral Daily  . vancomycin  750 mg Intravenous Q12H   Continuous Infusions:    . DISCONTD: sodium chloride 75 mL/hr at 10/27/11 0102     Assessment/Plan: Principal Problem:  [facility] acquired PNA- - continue current management, continue IV antibiotics today, will change to by mouth tomorrow. - VQ scan negative for pulmonary embolism, cardiac enzyme negative for ACS  Active Problems:  COPD (chronic obstructive pulmonary disease) with emphysema: Improving continue breathing treatments.   Diabetes mellitus type II, controlled: HbA1c 5.7,continue insulin   Parotid mass: Follow with Dr. Pollyann Kennedy for further evaluation   Hypothyroidism:check TSH and continue levothyroid   Hypokalemia: Stable   Depression, anxiety: Improved  after Increase Xanax to 1 mg twice a day.  Disposition: Likely DC home tomorrow with home physical therapy, patient and husband at bedside agrees with the plan.   LOS: 2 days   Paula [name] 10/27/2011, 1:30 PM

## 2011-10-28 LAB — GLUCOSE, CAPILLARY: Glucose-Capillary: 123 mg/dL — ABNORMAL HIGH (ref 70–99)

## 2011-10-28 MED ORDER — MOXIFLOXACIN HCL 400 MG PO TABS: 400.0000 mg | ORAL_TABLET | Freq: Every day | ORAL | Status: AC

## 2011-10-28 MED ORDER — BENZONATATE 100 MG PO CAPS: 100.0000 mg | ORAL_CAPSULE | Freq: Three times a day (TID) | ORAL | Status: DC | PRN

## 2011-10-28 MED ORDER — GUAIFENESIN ER 600 MG PO TB12: 600.0000 mg | ORAL_TABLET | Freq: Two times a day (BID) | ORAL | Status: DC

## 2011-10-28 MED ORDER — HYDROCODONE-ACETAMINOPHEN 5-325 MG PO TABS: 1.0000 | ORAL_TABLET | Freq: Three times a day (TID) | ORAL | Status: AC | PRN

## 2011-10-28 NOTE — Discharge Summary (Signed)
Physician Discharge Summary  Patient ID: Paula [name] MRN: [id number] DOB/AGE: Nov 21, 1926 77 y.o.  Admit date: 10/25/2011 Discharge date: 10/28/2011  Primary Care Physician:  Georgianne Fick, MD, MD  Discharge Diagnoses:   Present on Admission:  .Hospital acquired PNA .COPD (chronic obstructive pulmonary disease) with emphysema .Diabetes mellitus type II, controlled .Hypothyroidism .Hypokalemia .SOB (shortness of breath) .Depression .Anxiety .Parotid mass  Consults:  None   Discharge Medications: Discharge Medication List as of 10/28/2011 11:05 AM    START taking these medications   Details  benzonatate (TESSALON PERLES) 100 MG capsule Take 1 capsule (100 mg total) by mouth 3 (three) times daily as needed for cough., Starting 10/28/2011, Until Mon 11/04/11, Print    guaiFENesin (MUCINEX) 600 MG 12 hr tablet Take 1 tablet (600 mg total) by mouth 2 (two) times daily., Starting 10/28/2011, Until Tue 10/27/12, Print    HYDROcodone-acetaminophen (NORCO) 5-325 MG per tablet Take 1 tablet by mouth every 8 (eight) hours as needed for pain., Starting 10/28/2011, Until Thu 11/07/11, Print    moxifloxacin (AVELOX) 400 MG tablet Take 1 tablet (400 mg total) by mouth daily., Starting 10/28/2011, Until Mon 11/04/11, Print      CONTINUE these medications which have NOT CHANGED   Details  ALPRAZolam (XANAX) 0.5 MG tablet Take 0.5-1 mg by mouth 2 (two) times daily. Takes 1 tab in the am & 2 tabs at night , Until Discontinued, Historical Med    amitriptyline (ELAVIL) 10 MG tablet Take 10 mg by mouth at bedtime.  , Until Discontinued, Historical Med    aspirin 325 MG tablet Take 325 mg by mouth daily.  , Starting 10/14/2011, Until Tue 10/13/12, Historical Med    buPROPion (WELLBUTRIN XL) 300 MG 24 hr tablet Take 300 mg by mouth every morning.  , Until Discontinued, Historical Med    calcium carbonate (OS-CAL) 600 MG TABS Take 600 mg by mouth daily.  , Until Discontinued, Historical Med     carbamazepine (TEGRETOL) 100 MG chewable tablet Chew 100-200 mg by mouth 2 (two) times daily. PT TAKES 2 TABS IN THE MORNING & 1 TAB AT NIGHT. HOWEVER THE DIRECTIONS ON THE BOTTLE STATE TO TAKE 4 TABS IN THE MORNING & 2 TABS AT NIGHT. , Until Discontinued, Historical Med    Cholecalciferol (VITAMIN D3) 5000 UNITS CAPS Take 1 capsule by mouth daily.  , Until Discontinued, Historical Med    furosemide (LASIX) 20 MG tablet Take 20 mg by mouth every morning.  , Starting 10/14/2011, Until Tue 10/13/12, Historical Med    levothyroxine (SYNTHROID, LEVOTHROID) 150 MCG tablet Take 150 mcg by mouth every morning.  , Until Discontinued, Historical Med    lisinopril (PRINIVIL,ZESTRIL) 5 MG tablet Take 1 tablet (5 mg total) by mouth daily., Starting 10/07/2011, Until Tue 10/06/12, Print    ondansetron (ZOFRAN-ODT) 8 MG disintegrating tablet Take 8 mg by mouth every 8 (eight) hours as needed. FOR NAUSEA , Until Discontinued, Historical Med      STOP taking these medications     OVER THE COUNTER MEDICATION          Brief H and P: For complete details please refer to admission H and P, but in brief patient is 77 year old female with past medical history significant for hypothyroidism, appendectomy eczema, diabetes mellitus, hypertension, CHF presented to the emergency room secondary to shortness of breath, cough and generalized weakness. Patient denied any fevers or chills, any chest pain, nausea or vomiting or any abdominal pain. In ED she was found to  have a new left lower lung airspace disease concerning for pneumonia and patient was admitted for further workup  Hospital Course:   Principal Problem:  *Hospital acquired PNA- patient had been recently admitted in the hospital for facial twitching, hence, the pneumonia was treated as healthcare associated pneumonia. Patient was started on oxygen, breathing treatments, vancomycin, cefepime. Patient improved significantly and was discharged on by mouth  Avelox. During hospitalization patient had complained of smothering feeling in her chest, hence VQ scan  was done and was negative for pulmonary embolism, cardiac enzyme  were negative for ACS  Active Problems:  COPD (chronic obstructive pulmonary disease) with emphysema: Improved, patient had albuterol inhaler at home and was recommended to continue using it twice today.  Diabetes mellitus type II, controlled: HbA1c 5.7,continue insulin  Parotid mass: Follow with Dr. Pollyann Kennedy for further evaluation  Hypothyroidism: levothyroid was continued  Depression, anxiety: Patient was continued on Xanax  Day of Discharge BP 145/85  Pulse 83  Temp(Src) 98.4 F (36.9 C) (Oral)  Resp 20  Ht 5\' 5"  (1.651 m)  Wt 72.3 kg (159 lb 6.3 oz)  BMI 26.52 kg/m2  SpO2 95%  Physical Exam: General: Alert and awake oriented x3 not in any acute distress. HEENT: anicteric sclera, pupils reactive to light and accommodation CVS: S1-S2 clear no murmur rubs or gallops Chest: clear to auscultation bilaterally, no wheezing rales or rhonchi Abdomen: soft nontender, nondistended, normal bowel sounds, no organomegaly Extremities: no cyanosis, clubbing or edema noted bilaterally Neuro: Cranial nerves II-XII intact, no focal neurological deficits   The results of significant diagnostics from this hospitalization (including imaging, microbiology, ancillary and laboratory) are listed below for reference.    LAB RESULTS: Basic Metabolic Panel:  Lab 10/26/11 1308 10/25/11 2003 10/25/11 1617  NA 141 -- 141  K 3.6 -- 3.4*  CL 104 -- 105  CO2 28 -- 26  GLUCOSE 106* -- 146*  BUN 7 -- 9  CREATININE 0.82 -- 0.79  CALCIUM 9.3 -- 9.5  MG -- 2.3 --  PHOS -- 3.0 --   Liver Function Tests:  Lab 10/25/11 2003  AST 13  ALT 10  ALKPHOS 61  BILITOT 0.3  PROT 6.0  ALBUMIN 3.4*   CBC:  Lab 10/26/11 0600 10/25/11 2003 10/25/11 1617  WBC 8.5 9.6 --  NEUTROABS -- -- 6.0  HGB 10.6* 11.2* --  HCT 33.3* 34.4* --  MCV 103.4*  -- --  PLT 312 322 --   Cardiac Enzymes:  Lab 10/26/11 1057 10/25/11 1616  CKTOTAL 31 41  CKMB 2.5 2.9  CKMBINDEX -- --  TROPONINI <0.30 <0.30  CBG:  Lab 10/28/11 0839 10/28/11 0702  GLUCAP 123* 106*    Significant Diagnostic Studies:  X-ray Chest Pa And Lateral   10/26/2011  *RADIOLOGY REPORT*  Clinical Data: Shortness of breath.  CHEST - 2 VIEW  Comparison: Chest x-ray 10/25/2011.  Findings: The cardiac silhouette, mediastinal and hilar contours are stable.  There is tortuosity and mild ectasia of the thoracic aorta.  The left lower lobe process has improved.  Minimal residual atelectasis and a small pleural effusion are noted.  Stable pulmonary nodule in the left mid lung.  IMPRESSION: Improving left lower lobe process.  Original Report Authenticated By: P. Loralie Champagne, M.D.   Dg Chest Portable 1 View  10/25/2011  *RADIOLOGY REPORT*  Clinical Data: Shortness of breath.  PORTABLE CHEST - 1 VIEW  Comparison: Plain film of the chest 03/12/2010, 09/15/2011 and 10/05/2011.  Findings: The patient has a  small left effusion and left basilar airspace disease.  Right lung appears clear.  Chest is hyperexpanded.  Heart size is upper normal.  IMPRESSION: Small left effusion and basilar airspace disease worrisome for pneumonia.  Recommend follow-up plain films to clearing.  Original Report Authenticated By: Bernadene Bell. Maricela Curet, M.D.     Disposition and Follow-up: Discharge Orders    Future Appointments: Provider: Department: Dept Phone: Center:   11/15/2011 8:50 AM [facility] [phone] [facility]   11/26/2011 9:00 AM [facility] [phone] [facility]   11/26/2011 9:30 AM Pryor Ochoa, MD [facility] [phone] [facility]   12/13/2011 9:00 AM Luis Abed, MD [facility] [phone] [facility]     Future Orders Please Complete By Expires   Diet - low sodium heart healthy      Increase activity slowly          DISPOSITION: Home  DIET: CARB  MODIFIED DIET  ACTIVITY: as tolerated   DISCHARGE FOLLOW-UP Follow-up Information    Follow up with [facility], MD. Make an appointment in 10 days. (or earlier if symptoms worsen)          Time spent on Discharge: 45 minutes  Signed: RAI,RIPUDEEP 10/28/2011, 3:10 PM

## 2011-10-28 NOTE — Progress Notes (Addendum)
PHARMACY - ANTIBIOTIC CONSULT FOLLOW-UP NOTE  Pharmacy Consult for: Vancomycin / Cefepime  Indication:  r/o pneumonia   Patient Data:   Allergies: Allergies  Allergen Reactions  . Contrast Media (Iodinated Diagnostic Agents) Anaphylaxis  . Sulfa Antibiotics Anaphylaxis  . Iohexol      Desc: PT STATES SHE HAD A SEVERE REACTION TO IV CONRAST WITH THROAT SWELLING AND SOB. SHE WAS ADMITTED TO THE HOSPITAL. SHE HAS NEVER HAD CONTRAST AGAIN.   Marland Kitchen Penicillins Rash    Patient Measurements: Height: 5\' 5"  (165.1 cm) Weight: 159 lb 6.3 oz (72.3 kg) IBW/kg (Calculated) : 57  Adjusted Body Weight:   Vital Signs: Temp:  [98.2 F (36.8 C)-99.4 F (37.4 C)] 98.4 F (36.9 C) (12/17 0500) Pulse Rate:  [61-88] 83  (12/17 0500) Resp:  [18-20] 20  (12/17 0500) BP: (113-146)/(69-88) 146/88 mmHg (12/17 0500) SpO2:  [93 %-98 %] 95 % (12/17 0500) Weight:  [159 lb 6.3 oz (72.3 kg)] 159 lb 6.3 oz (72.3 kg) (12/17 0500)  Intake/Output from previous day:  Intake/Output Summary (Last 24 hours) at 10/28/11 0919 Last data filed at 10/28/11 1610  Gross per 24 hour  Intake 1768.75 ml  Output   1300 ml  Net 468.75 ml    Labs:  Basename 10/26/11 0600 10/25/11 2003 10/25/11 1617  WBC 8.5 9.6 9.8  HGB 10.6* 11.2* 11.2*  PLT 312 322 320  LABCREA -- -- --  CREATININE 0.82 -- 0.79   Estimated Creatinine Clearance: 52.7 ml/min (by C-G formula based on Cr of 0.82). No results found for this basename: VANCOTROUGH:2,VANCOPEAK:2,VANCORANDOM:2,GENTTROUGH:2,GENTPEAK:2,GENTRANDOM:2,TOBRATROUGH:2,TOBRAPEAK:2,TOBRARND:2,AMIKACINPEAK:2,AMIKACINTROU:2,AMIKACIN:2, in the last 72 hours   Microbiology: Recent Results (from the past 720 hour(s))  URINE CULTURE     Status: Normal   Collection Time   10/25/11  5:57 PM      Component Value Range Status Comment   Specimen Description URINE, CLEAN CATCH   Final    Special Requests NONE   Final    Setup Time [id number]   Final    Colony Count 20,OOO COLONIES/ML    Final    Culture     Final    Value: Multiple bacterial morphotypes present, none predominant. Suggest appropriate recollection if clinically indicated.   Report Status 10/27/2011 FINAL   Final     Anti-infectives     Start     Dose/Rate Route Frequency Ordered Stop   10/26/11 0600   vancomycin (VANCOCIN) 750 mg in sodium chloride 0.9 % 150 mL IVPB        750 mg 150 mL/hr over 60 Minutes Intravenous Every 12 hours 10/25/11 2019     10/25/11 2100   ceFEPIme (MAXIPIME) 1 g in dextrose 5 % 50 mL IVPB        1 g 100 mL/hr over 30 Minutes Intravenous Every 12 hours 10/25/11 2022     10/25/11 1800   ceFEPIme (MAXIPIME) 1 g in dextrose 5 % 50 mL IVPB     Comments: Dr. Manus Gunning is aware of PCN allergy      1 g 100 mL/hr over 30 Minutes Intravenous [facility] 10/25/11 1658 10/26/11 1800   10/25/11 1700   vancomycin (VANCOCIN) IVPB 1000 mg/200 mL premix        1,000 mg 200 mL/hr over 60 Minutes Intravenous  Once 10/25/11 1658 10/25/11 1848           Assessment:  77 y.o. female admitted on 10/25/2011, is being treated for r/o pneumonia with D3 Vancomycin / Cefepime. Dosing  is appropriate for renal function  Goal of Therapy:  1. Vancomycin trough level 15-20 mcg/ml   Plan:  1. Continue antibiotics at current doses. 2. Will obtain vancomycin trough in 1-2 days if continued.   Dineen Kid Thad Ranger, PharmD 10/28/2011, 9:19 AM

## 2011-10-28 NOTE — Progress Notes (Signed)
Paula [name] to be D/C'd Home per MD order.  Discussed with the patient and all questions fully answered.   [name], [name]  Home Medication Instructions ZOX:[id number]   Printed on:10/28/11 1124  Medication Information                    amitriptyline (ELAVIL) 10 MG tablet Take 10 mg by mouth at bedtime.             calcium carbonate (OS-CAL) 600 MG TABS Take 600 mg by mouth daily.             lisinopril (PRINIVIL,ZESTRIL) 5 MG tablet Take 1 tablet (5 mg total) by mouth daily.           buPROPion (WELLBUTRIN XL) 300 MG 24 hr tablet Take 300 mg by mouth every morning.             levothyroxine (SYNTHROID, LEVOTHROID) 150 MCG tablet Take 150 mcg by mouth every morning.             ALPRAZolam (XANAX) 0.5 MG tablet Take 0.5-1 mg by mouth 2 (two) times daily. Takes 1 tab in the am & 2 tabs at night            Cholecalciferol (VITAMIN D3) 5000 UNITS CAPS Take 1 capsule by mouth daily.             ondansetron (ZOFRAN-ODT) 8 MG disintegrating tablet Take 8 mg by mouth every 8 (eight) hours as needed. FOR NAUSEA            carbamazepine (TEGRETOL) 100 MG chewable tablet Chew 100-200 mg by mouth 2 (two) times daily. PT TAKES 2 TABS IN THE MORNING & 1 TAB AT NIGHT. HOWEVER THE DIRECTIONS ON THE BOTTLE STATE TO TAKE 4 TABS IN THE MORNING & 2 TABS AT NIGHT.            furosemide (LASIX) 20 MG tablet Take 20 mg by mouth every morning.             aspirin 325 MG tablet Take 325 mg by mouth daily.             guaiFENesin (MUCINEX) 600 MG 12 hr tablet Take 1 tablet (600 mg total) by mouth 2 (two) times daily.           HYDROcodone-acetaminophen (NORCO) 5-325 MG per tablet Take 1 tablet by mouth every 8 (eight) hours as needed for pain.           moxifloxacin (AVELOX) 400 MG tablet Take 1 tablet (400 mg total) by mouth daily.           benzonatate (TESSALON PERLES) 100 MG capsule Take 1 capsule (100 mg total) by mouth 3 (three) times daily as needed for cough.             [facility],  Skin clean, dry and intact without evidence of skin break down, no evidence of skin tears noted. IV catheter discontinued intact. Site without signs and symptoms of complications. Dressing and pressure applied.  An After Visit Summary was printed and given to the patient. Patient escorted via WC, and D/C home via private auto.  Debbora Presto 10/28/2011 11:24 AM

## 2011-10-28 NOTE — Progress Notes (Signed)
Utilization Review Completed.Paula [name] T12/17/2012   

## 2011-10-28 NOTE — Progress Notes (Signed)
   CARE MANAGEMENT NOTE 10/28/2011  Patient:  Paula [name], Paula [name]   Account Number:  [id number]  Date Initiated:  10/28/2011  Documentation initiated by:  Alvira Philips Assessment:   77 yr-old female adm with PNA; lives with spouse, has walker that she rarely uses.     Action/Plan:   Anticipated DC Date:  10/28/2011   Anticipated DC Plan:  HOME/SELF CARE      DC Planning Services  CM consult             HH arranged  HH - 11 Patient Refused      Status of service:  Completed, signed off  Discharge Disposition:  HOME/SELF CARE   Comments:  PCP:  Dr. Nicholos Johns  10/28/11 1050 Luccas Towell RN MSN CCM PT recommends home health PT but pt and spouse decline, state she has had PT in the past and they know what to do to get her stronger.  They also decline home health RN, state pt will be in to see PCP in 10 days.  Spouse very supportive, both agree that pt will either use walker or he will assist with ambulation and they will call PCP with any issues that arise after d/c.

## 2011-10-31 ENCOUNTER — Emergency Department (HOSPITAL_COMMUNITY): Payer: Medicare Other

## 2011-10-31 ENCOUNTER — Other Ambulatory Visit: Payer: Self-pay

## 2011-10-31 ENCOUNTER — Emergency Department (HOSPITAL_COMMUNITY)
Admission: EM | Admit: 2011-10-31 | Discharge: 2011-10-31 | Disposition: A | Discharge status: Home / Self Care | Payer: Medicare Other | Attending: Emergency Medicine | Admitting: Emergency Medicine

## 2011-10-31 ENCOUNTER — Encounter (HOSPITAL_COMMUNITY): Payer: Self-pay | Admitting: Emergency Medicine

## 2011-10-31 DIAGNOSIS — I714 Abdominal aortic aneurysm, without rupture, unspecified: Secondary | ICD-10-CM | POA: Insufficient documentation

## 2011-10-31 DIAGNOSIS — E039 Hypothyroidism, unspecified: Secondary | ICD-10-CM | POA: Insufficient documentation

## 2011-10-31 DIAGNOSIS — R059 Cough, unspecified: Secondary | ICD-10-CM | POA: Insufficient documentation

## 2011-10-31 DIAGNOSIS — R5381 Other malaise: Secondary | ICD-10-CM | POA: Insufficient documentation

## 2011-10-31 DIAGNOSIS — F329 Major depressive disorder, single episode, unspecified: Secondary | ICD-10-CM | POA: Insufficient documentation

## 2011-10-31 DIAGNOSIS — R0602 Shortness of breath: Secondary | ICD-10-CM | POA: Insufficient documentation

## 2011-10-31 DIAGNOSIS — R110 Nausea: Secondary | ICD-10-CM | POA: Insufficient documentation

## 2011-10-31 DIAGNOSIS — Z9889 Other specified postprocedural states: Secondary | ICD-10-CM | POA: Insufficient documentation

## 2011-10-31 DIAGNOSIS — J90 Pleural effusion, not elsewhere classified: Secondary | ICD-10-CM | POA: Insufficient documentation

## 2011-10-31 DIAGNOSIS — R531 Weakness: Secondary | ICD-10-CM

## 2011-10-31 DIAGNOSIS — J4489 Other specified chronic obstructive pulmonary disease: Secondary | ICD-10-CM | POA: Insufficient documentation

## 2011-10-31 DIAGNOSIS — R5383 Other fatigue: Secondary | ICD-10-CM | POA: Insufficient documentation

## 2011-10-31 DIAGNOSIS — M549 Dorsalgia, unspecified: Secondary | ICD-10-CM | POA: Insufficient documentation

## 2011-10-31 DIAGNOSIS — I951 Orthostatic hypotension: Secondary | ICD-10-CM | POA: Insufficient documentation

## 2011-10-31 DIAGNOSIS — R109 Unspecified abdominal pain: Secondary | ICD-10-CM | POA: Insufficient documentation

## 2011-10-31 DIAGNOSIS — Z7982 Long term (current) use of aspirin: Secondary | ICD-10-CM | POA: Insufficient documentation

## 2011-10-31 DIAGNOSIS — R42 Dizziness and giddiness: Secondary | ICD-10-CM | POA: Insufficient documentation

## 2011-10-31 DIAGNOSIS — E119 Type 2 diabetes mellitus without complications: Secondary | ICD-10-CM | POA: Insufficient documentation

## 2011-10-31 DIAGNOSIS — I509 Heart failure, unspecified: Secondary | ICD-10-CM | POA: Insufficient documentation

## 2011-10-31 DIAGNOSIS — J449 Chronic obstructive pulmonary disease, unspecified: Secondary | ICD-10-CM | POA: Insufficient documentation

## 2011-10-31 DIAGNOSIS — F3289 Other specified depressive episodes: Secondary | ICD-10-CM | POA: Insufficient documentation

## 2011-10-31 DIAGNOSIS — Z79899 Other long term (current) drug therapy: Secondary | ICD-10-CM | POA: Insufficient documentation

## 2011-10-31 DIAGNOSIS — R05 Cough: Secondary | ICD-10-CM | POA: Insufficient documentation

## 2011-10-31 DIAGNOSIS — R10819 Abdominal tenderness, unspecified site: Secondary | ICD-10-CM | POA: Insufficient documentation

## 2011-10-31 DIAGNOSIS — I10 Essential (primary) hypertension: Secondary | ICD-10-CM | POA: Insufficient documentation

## 2011-10-31 LAB — CBC
Hemoglobin: 10.9 g/dL — ABNORMAL LOW (ref 12.0–15.0)
MCH: 33.5 pg (ref 26.0–34.0)
MCHC: 32.4 g/dL (ref 30.0–36.0)
MCV: 103.4 fL — ABNORMAL HIGH (ref 78.0–100.0)
RBC: 3.25 MIL/uL — ABNORMAL LOW (ref 3.87–5.11)

## 2011-10-31 LAB — BASIC METABOLIC PANEL
BUN: 9 mg/dL (ref 6–23)
Calcium: 9 mg/dL (ref 8.4–10.5)
Creatinine, Ser: 0.78 mg/dL (ref 0.50–1.10)
GFR calc non Af Amer: 76 mL/min — ABNORMAL LOW (ref >90)
Glucose, Bld: 100 mg/dL — ABNORMAL HIGH (ref 70–99)

## 2011-10-31 LAB — URINALYSIS, ROUTINE W REFLEX MICROSCOPIC
Bilirubin Urine: NEGATIVE
Glucose, UA: NEGATIVE mg/dL
Hgb urine dipstick: NEGATIVE
Ketones, ur: NEGATIVE mg/dL
Protein, ur: NEGATIVE mg/dL

## 2011-10-31 LAB — CARDIAC PANEL(CRET KIN+CKTOT+MB+TROPI)
CK, MB: 1.9 ng/mL (ref 0.3–4.0)
Relative Index: INVALID (ref 0.0–2.5)
Total CK: 37 U/L (ref 7–177)

## 2011-10-31 LAB — DIFFERENTIAL
Eosinophils Absolute: 0.3 10*3/uL (ref 0.0–0.7)
Eosinophils Relative: 4 % (ref 0–5)
Lymphs Abs: 1.9 10*3/uL (ref 0.7–4.0)
Monocytes Relative: 7 % (ref 3–12)

## 2011-10-31 MED ORDER — ONDANSETRON HCL 4 MG/2ML IJ SOLN
4.0000 mg | Freq: Once | INTRAMUSCULAR | Status: DC
  Filled 2011-10-31: qty 2

## 2011-10-31 MED ORDER — SODIUM CHLORIDE 0.9 % IV BOLUS (SEPSIS)
500.0000 mL | Freq: Once | INTRAVENOUS | Status: AC
  Administered 2011-10-31: 500 mL via INTRAVENOUS

## 2011-10-31 NOTE — Consult Note (Signed)
[facility]      Consult Note  Patient name: Paula [name] MRN: [id number] DOB: [birth year] Sex: female  Consulting Physician:  ER  Reason for Consult:  Chief Complaint  Patient presents with  . Near Syncope    HISTORY OF PRESENT ILLNESS: Pt came to ED with near syncopal episode.  She was also c/o LLQ pain.  A CT scan (non-contrast due to dye allergy ) was performed and revealed a 5.4 cm AAA.  The patient denies abdominal pain.  She recently had been dx'd with pneumonia and had been on ABxShe recently had a MI and during that time had a temporary pacer placed.  She denies GU sx's.  No current chest pain but does have some indigestion.  She is diabetic but does not tend to it very well.  She does not smoke currently.  Past Medical History  Diagnosis Date  . Diabetes mellitus   . Thyroid disease   . Abdominal aneurysm   . COPD (chronic obstructive pulmonary disease)   . Hypertension   . Shortness of breath   . Hypothyroidism   . Depression   . Recurrent upper respiratory infection (URI)   . Seizures   . Ejection fraction     EF 40%, echo, September 20, 2011, severe hypokinesis of the entire apex  . Abnormal EKG     November, 2012  . Contrast media allergy     Patient feels poorly with contrast    Past Surgical History  Procedure Date  . Cholecystectomy   . Joint replacement   . Sinus surgery with instatrak   . Appendectomy   . Oophorectomy   . Arthroscopic repair acl     History   Social History  . Marital Status: Married    Spouse Name: N/A    Number of Children: N/A  . Years of Education: N/A   Occupational History  . Not on file.   Social History Main Topics  . Smoking status: Former Smoker    Quit date: 08/31/2011  . Smokeless tobacco: Not on file  . Alcohol Use: No  . Drug Use: No  . Sexually Active:    Other Topics Concern  . Not on file   Social History Narrative  . No narrative on file    Family History  Problem  Relation Age of Onset  . Cancer Father     Allergies as of 10/31/2011 - Review Complete 10/31/2011  Allergen Reaction Noted  . Contrast media (iodinated diagnostic agents) Anaphylaxis 09/15/2011  . Iohexol  08/10/2007  . Penicillins Rash 09/15/2011  . Sulfa antibiotics Anaphylaxis 09/15/2011    No current facility-administered medications on file prior to encounter.   Current Outpatient Prescriptions on File Prior to Encounter  Medication Sig Dispense Refill  . ALPRAZolam (XANAX) 0.5 MG tablet Take 0.5-1 mg by mouth 2 (two) times daily. Takes 1 tab in the am & 2 tabs at night      . amitriptyline (ELAVIL) 10 MG tablet Take 10 mg by mouth at bedtime.        Marland Kitchen aspirin 325 MG tablet Take 325 mg by mouth daily.        . benzonatate (TESSALON PERLES) 100 MG capsule Take 1 capsule (100 mg total) by mouth 3 (three) times daily as needed for cough.  30 capsule  0  . buPROPion (WELLBUTRIN XL) 300 MG 24 hr tablet Take 300 mg by mouth every morning.        Marland Kitchen  calcium carbonate (OS-CAL) 600 MG TABS Take 600 mg by mouth daily.        . carbamazepine (TEGRETOL) 100 MG chewable tablet Chew 100-200 mg by mouth 2 (two) times daily. PT TAKES 2 TABS IN THE MORNING & 1 TAB AT NIGHT. HOWEVER THE DIRECTIONS ON THE BOTTLE STATE TO TAKE 4 TABS IN THE MORNING & 2 TABS AT NIGHT.       Marland Kitchen Cholecalciferol (VITAMIN D3) 5000 UNITS CAPS Take 1 capsule by mouth daily.        . furosemide (LASIX) 20 MG tablet Take 20 mg by mouth every morning.        Marland Kitchen guaiFENesin (MUCINEX) 600 MG 12 hr tablet Take 1 tablet (600 mg total) by mouth 2 (two) times daily.  30 tablet  0  . HYDROcodone-acetaminophen (NORCO) 5-325 MG per tablet Take 1 tablet by mouth every 8 (eight) hours as needed for pain.  30 tablet  0  . levothyroxine (SYNTHROID, LEVOTHROID) 150 MCG tablet Take 150 mcg by mouth every morning.        Marland Kitchen lisinopril (PRINIVIL,ZESTRIL) 5 MG tablet Take 1 tablet (5 mg total) by mouth daily.  30 tablet  0  . moxifloxacin (AVELOX)  400 MG tablet Take 1 tablet (400 mg total) by mouth daily.  7 tablet  0  . ondansetron (ZOFRAN-ODT) 8 MG disintegrating tablet Take 8 mg by mouth every 8 (eight) hours as needed. FOR NAUSEA          REVIEW OF SYSTEMS: See ROS from ER note today.  No changes   PHYSICAL EXAMINATION: General: The patient appears their stated age.  Vital signs are BP 134/87  Pulse 80  Temp(Src) 98.4 F (36.9 C) (Oral)  Resp 14  Ht 5\' 5"  (1.651 m)  Wt 159 lb (72.122 kg)  BMI 26.46 kg/m2  SpO2 96% Pulmonary: Respirations are non-labored HEENT:  No gross abnormalities Abdomen: Soft and non-tender.  Aorta palpable and not tender Musculoskeletal: There are no major deformities.   Neurologic: No focal weakness or paresthesias are detected, Skin: There are no ulcer or rashes noted. Psychiatric: The patient has normal affect. Cardiovascular: There is a regular rate and rhythm without significant murmur appreciated.  Palpable pedal pulses  Diagnostic Studies: CT shows 5.4 cm AAA without signs of rupture    Assessment:  AAA Plan: I do not feel that this patient has any symptoms related to her AAA.  I will schedule outpatient follow-up with Dr. Hart Rochester in January.  With the patients recent cardiac issues, she would benefit from delaying any type of repair until she is more stable.     Jorge Ny, M.D. [facility] Office: [phone] Pager:  [phone]

## 2011-10-31 NOTE — ED Notes (Signed)
Pt currently in x ray/CT.

## 2011-10-31 NOTE — Progress Notes (Signed)
Spoke with pt and family.  They were concerned that she has been in the hospital 5 times recently.  She reported that the doctor this morning told her that her anuerysm was getting larger.  She is concerned about this and listed several famous people who have died from an anuerysm.  She is clearly concerned about what her future holds and about any surgeries that she may be having.    I offered emotional support to the patient and family.  Thornell Sartorius 11:26 AM

## 2011-10-31 NOTE — ED Provider Notes (Signed)
History     CSN: [id number] Arrival date & time: 10/31/2011  7:03 AM   First MD Initiated Contact with Patient 10/31/11 [phone]      Chief Complaint  Patient presents with  . Near Syncope    (Consider location/radiation/quality/duration/timing/severity/associated sxs/prior treatment) HPI Comments: Patient presents from home with near syncopal episode and feeling poorly.  To the hospital December 14 2 the 17th with pneumonia and went home on Avelox. She states she's been doing okay at home this morning felt like she was going to pass out but did not actually is consciousness. She has been feeling generally weak and poor by mouth intake.  She denies Chest pain, shortness of breath, abdominal pain. She does have a vague left flank pain that woke her from sleep this morning.  She denies any dysuria or hematuria.  It is difficult to tease out her history but she states she's not felt well for the past couple days since coming home from the hospital 3 days ago.  The history is provided by the patient and the spouse.    Past Medical History  Diagnosis Date  . Diabetes mellitus   . Thyroid disease   . Abdominal aneurysm   . COPD (chronic obstructive pulmonary disease)   . Hypertension   . Shortness of breath   . Hypothyroidism   . Depression   . Recurrent upper respiratory infection (URI)   . Seizures   . Ejection fraction     EF 40%, echo, September 20, 2011, severe hypokinesis of the entire apex  . Abnormal EKG     November, 2012  . Contrast media allergy     Patient feels poorly with contrast    Past Surgical History  Procedure Date  . Cholecystectomy   . Joint replacement   . Sinus surgery with instatrak   . Appendectomy   . Oophorectomy   . Arthroscopic repair acl     Family History  Problem Relation Age of Onset  . Cancer Father     History  Substance Use Topics  . Smoking status: Former Smoker    Quit date: 08/31/2011  . Smokeless tobacco: Not on file  . Alcohol  Use: No    OB History    Grav Para Term Preterm Abortions TAB SAB Ect Mult Living                  Review of Systems  Constitutional: Positive for activity change, appetite change and fatigue. Negative for fever.  HENT: Negative for congestion and rhinorrhea.   Eyes: Negative for visual disturbance.  Respiratory: Positive for cough. Negative for chest tightness and shortness of breath.   Cardiovascular: Negative for chest pain.  Gastrointestinal: Negative for nausea, vomiting, abdominal pain and diarrhea.  Genitourinary: Negative for dysuria, hematuria, vaginal bleeding and vaginal discharge.  Musculoskeletal: Positive for back pain.  Skin: Negative for rash.  Neurological: Positive for weakness and light-headedness. Negative for dizziness, syncope and headaches.    Allergies  Contrast media; Iohexol; Penicillins; and Sulfa antibiotics  Home Medications   Current Outpatient Rx  Name Route Sig Dispense Refill  . ALPRAZOLAM 0.5 MG PO TABS Oral Take 0.5-1 mg by mouth 2 (two) times daily. Takes 1 tab in the am & 2 tabs at night    . AMITRIPTYLINE HCL 10 MG PO TABS Oral Take 10 mg by mouth at bedtime.      . ASPIRIN 325 MG PO TABS Oral Take 325 mg by mouth daily.      Marland Kitchen  BENZONATATE 100 MG PO CAPS Oral Take 1 capsule (100 mg total) by mouth 3 (three) times daily as needed for cough. 30 capsule 0  . BUPROPION HCL ER (XL) 300 MG PO TB24 Oral Take 300 mg by mouth every morning.      Marland Kitchen CALCIUM CARBONATE 600 MG PO TABS Oral Take 600 mg by mouth daily.      Marland Kitchen CARBAMAZEPINE 100 MG PO CHEW Oral Chew 100-200 mg by mouth 2 (two) times daily. PT TAKES 2 TABS IN THE MORNING & 1 TAB AT NIGHT. HOWEVER THE DIRECTIONS ON THE BOTTLE STATE TO TAKE 4 TABS IN THE MORNING & 2 TABS AT NIGHT.     Marland Kitchen VITAMIN D3 5000 UNITS PO CAPS Oral Take 1 capsule by mouth daily.      . FUROSEMIDE 20 MG PO TABS Oral Take 20 mg by mouth every morning.      . GUAIFENESIN ER 600 MG PO TB12 Oral Take 1 tablet (600 mg total) by  mouth 2 (two) times daily. 30 tablet 0  . HYDROCODONE-ACETAMINOPHEN 5-325 MG PO TABS Oral Take 1 tablet by mouth every 8 (eight) hours as needed for pain. 30 tablet 0  . LEVOTHYROXINE SODIUM 150 MCG PO TABS Oral Take 150 mcg by mouth every morning.      Marland Kitchen LISINOPRIL 5 MG PO TABS Oral Take 1 tablet (5 mg total) by mouth daily. 30 tablet 0  . MOXIFLOXACIN HCL 400 MG PO TABS Oral Take 1 tablet (400 mg total) by mouth daily. 7 tablet 0  . ONDANSETRON 8 MG PO TBDP Oral Take 8 mg by mouth every 8 (eight) hours as needed. FOR NAUSEA       BP 134/87  Pulse 80  Temp(Src) 98.4 F (36.9 C) (Oral)  Resp 14  Ht 5\' 5"  (1.651 m)  Wt 159 lb (72.122 kg)  BMI 26.46 kg/m2  SpO2 96%  Physical Exam  Constitutional: She is oriented to person, place, and time. She appears well-developed and well-nourished. No distress.  HENT:  Head: Normocephalic and atraumatic.  Mouth/Throat: Oropharynx is clear and moist. No oropharyngeal exudate.  Eyes: Conjunctivae are normal. Pupils are equal, round, and reactive to light.  Neck: Normal range of motion. Neck supple.  Cardiovascular: Normal rate, regular rhythm and normal heart sounds.   No murmur heard.      +2 femoral, DP, radial pulses  Pulmonary/Chest: Effort normal and breath sounds normal. No respiratory distress.  Abdominal: Soft. There is no tenderness. There is no rebound and no guarding.  Musculoskeletal: She exhibits no edema and no tenderness.       Mild L flank tenderness  Neurological: She is alert and oriented to person, place, and time. No cranial nerve deficit.       CN 2-12 intact, no focal deficits, 5/5 strength throughout.  Skin: Skin is warm.    ED Course  Procedures (including critical care time)  Labs Reviewed  CBC - Abnormal; Notable for the following:    RBC 3.25 (*)    Hemoglobin 10.9 (*)    HCT 33.6 (*)    MCV 103.4 (*)    All other components within normal limits  BASIC METABOLIC PANEL - Abnormal; Notable for the following:     Glucose, Bld 100 (*)    GFR calc non Af Amer 76 (*)    GFR calc Af Amer 88 (*)    All other components within normal limits  URINALYSIS, ROUTINE W REFLEX MICROSCOPIC - Abnormal; Notable for  the following:    APPearance CLOUDY (*)    All other components within normal limits  CARDIAC PANEL(CRET KIN+CKTOT+MB+TROPI)  DIFFERENTIAL  PRO B NATRIURETIC PEPTIDE  CARDIAC PANEL(CRET KIN+CKTOT+MB+TROPI)   Ct Abdomen Pelvis Wo Contrast  10/31/2011  *RADIOLOGY REPORT*  Clinical Data: Near-syncope, flank pain, prior anaphylactic reaction to contrast media.  CT ABDOMEN AND PELVIS WITHOUT CONTRAST  Technique:  Multidetector CT imaging of the abdomen and pelvis was performed following the standard protocol without intravenous contrast.  Comparison: CT abdomen and pelvis of 08/10/2007  Findings: Images through the lung bases do show a left pleural effusion to be present with mild left basilar atelectasis.  The liver is unremarkable in the unenhanced state.  There are a few scattered hepatic and splenic granulomas most consistent with prior granulomatous disease.  No ductal dilatation is seen.  Surgical clips are present from prior cholecystectomy.  The pancreas is normal in size and the pancreatic duct is not dilated.  The adrenal glands are unremarkable.  The stomach is decompressed.  No renal calculi are seen.  There is no evidence of hydronephrosis.  There has been some increase in size of the previously demonstrated infrarenal abdominal aortic aneurysm now measuring 5.4 x 4.9 cm compared to a prior measurements of 4.4 x 4.1 cm.  No evidence of rupture is seen.  There is also fusiform dilatation of the suprarenal abdominal aorta.  The infrarenal abdominal aorta terminates at the bifurcation into the iliac arteries.  The common iliac arteries are normal in caliber.  The uterus is normal in size.  No adnexal lesion is seen.  The urinary bladder is unremarkable.  Metallic clips are present probably due to prior repair  of ventral hernia.  Some fat does extend into the left inguinal canal.  There are a few scattered rectosigmoid colonic diverticula but no diverticulitis is noted. No abnormality of the colon is seen.  The terminal ileum is unremarkable.  The appendix is not seen.  There are degenerative changes throughout the lumbar spine.  IMPRESSION:  1.  Increase in size of the abdominal aortic aneurysm now measuring 5.4 x 4.9 cm compared to prior measurements of 4.4 x 4.1 cm.  No evidence of rupture of this aneurysm. 2.  Small left pleural effusion and left basilar atelectasis. 3.  Fusiform dilatation of the suprarenal abdominal aorta as well. 4.  Some fat does extend into the left inguinal canal. 5.  Scattered rectosigmoid colonic diverticula.  Original Report Authenticated By: Juline Patch, M.D.   Dg Chest 2 View  10/31/2011  *RADIOLOGY REPORT*  Clinical Data: Syncope, nausea  CHEST - 2 VIEW  Comparison: 10/26/2011  Findings: Enlargement of cardiac silhouette. Calcified tortuous aorta. Pulmonary vascularity normal. Minimal right basilar atelectasis. Improved aeration left base. Small left pleural effusion. Underlying emphysematous and minimal bronchitic changes. Minimal biapical scarring greater on the left. Calcified granuloma left lower lobe. No acute infiltrate or pneumothorax. Bones diffusely demineralized.  IMPRESSION: Emphysematous and minimal bronchitic changes. Minimal right basilar atelectasis. Improved left lower lobe opacity with residual small left pleural effusion.  Original Report Authenticated By: Lollie Marrow, M.D.     1. Weakness   2. Orthostasis   3. Pleural effusion   4. AAA (abdominal aortic aneurysm)   5. Nausea       MDM  Generalized weakness, near-syncope, recent admission for pneumonia. Vital signs stable at this time patient no distress. Nonfocal neuro exam. Orthostatic by EMS report. Labs, UA, CXR, IVF.  Patient has had a  recent stress test earlier this month.  She also had a  negative VQ scan on 12/15, EF 40% on echo in Nov.  She denies any chest pain or shortness of breath. She does have a vague left-sided flank pain and a history of AAA.  With her near-syncope and a history of AAA I feel it is appropriate to evaluate her aorta. She is however allergic to IV contrast.  D/w Dr. Gery Pray of radiology who states noncontrast CT should show increased size or acute leak but [place] may be needed to evaluate for dissection.  She is hemodynamically stable with equal peripheral pulses.    AAA now measures 5.4 x 4.9 cm which is increased from [place] measurements of 4.4 x 4.7 cm in Dec 2011.  Will discuss with vascular. No evidence of rupture.  Hemoglobin and vitals stable. D/w Dr. Myra Gianotti.  He states without evidence of rupture and no current abdominal pain she is stable for outpatient followup but he will evaluate patient. Dr. Myra Gianotti has seen patient.  He does not recommend operative intervention at this time given recent illnesses and hospitalizations.  He does not believe patient's symptoms are due to AAA.  Patient ambulatory in the ED with minimal assistance and at her baseline per her and her husband.  Tolerating PO.  Cardiac enzymes negative x2.  Nausea may be related to avelox use.  D/w PCP Dr. Nicholos Johns who agrees to see patient in office tomorrow.  Patient and husband agree with plan.    Date: 10/31/2011  Rate: 85  Rhythm: normal sinus rhythm  QRS Axis: normal  Intervals: PR prolonged  ST/T Wave abnormalities: nonspecific T wave changes  Conduction Disutrbances:none  Narrative Interpretation: stable T wave inversions anterolateral leads  Old EKG Reviewed: unchanged  CRITICAL CARE Performed by: Glynn Octave   Total critical care time: 40  Critical care time was exclusive of separately billable procedures and treating other patients.  Critical care was necessary to treat or prevent imminent or life-threatening deterioration.  Critical care was time spent personally  by me on the following activities: development of treatment plan with patient and/or surrogate as well as nursing, discussions with consultants, evaluation of patient's response to treatment, examination of patient, obtaining history from patient or surrogate, ordering and performing treatments and interventions, ordering and review of laboratory studies, ordering and review of radiographic studies, pulse oximetry and re-evaluation of patient's condition.    Glynn Octave, MD 10/31/11 1325

## 2011-10-31 NOTE — ED Notes (Signed)
Pt states she is not  Nauseated. Declined zofran.

## 2011-10-31 NOTE — ED Notes (Addendum)
Received pt. From EMS via stretcher with complaint of feeling like she was going to pass out, EMS reports orthostatic hypotension arrival,NAD noted

## 2011-11-03 ENCOUNTER — Emergency Department (HOSPITAL_COMMUNITY): Payer: Medicare Other

## 2011-11-03 ENCOUNTER — Encounter (HOSPITAL_COMMUNITY): Payer: Self-pay

## 2011-11-03 ENCOUNTER — Observation Stay (HOSPITAL_COMMUNITY)
Admission: EM | Admit: 2011-11-03 | Discharge: 2011-11-05 | Disposition: A | Discharge status: Home / Self Care | Payer: Medicare Other | Source: Ambulatory Visit | Attending: Cardiology | Admitting: Cardiology

## 2011-11-03 ENCOUNTER — Other Ambulatory Visit: Payer: Self-pay

## 2011-11-03 DIAGNOSIS — I714 Abdominal aortic aneurysm, without rupture, unspecified: Secondary | ICD-10-CM | POA: Insufficient documentation

## 2011-11-03 DIAGNOSIS — R0609 Other forms of dyspnea: Secondary | ICD-10-CM | POA: Insufficient documentation

## 2011-11-03 DIAGNOSIS — E039 Hypothyroidism, unspecified: Secondary | ICD-10-CM | POA: Insufficient documentation

## 2011-11-03 DIAGNOSIS — I509 Heart failure, unspecified: Secondary | ICD-10-CM | POA: Insufficient documentation

## 2011-11-03 DIAGNOSIS — F3289 Other specified depressive episodes: Secondary | ICD-10-CM | POA: Insufficient documentation

## 2011-11-03 DIAGNOSIS — F329 Major depressive disorder, single episode, unspecified: Secondary | ICD-10-CM | POA: Insufficient documentation

## 2011-11-03 DIAGNOSIS — R404 Transient alteration of awareness: Secondary | ICD-10-CM | POA: Insufficient documentation

## 2011-11-03 DIAGNOSIS — E876 Hypokalemia: Secondary | ICD-10-CM | POA: Insufficient documentation

## 2011-11-03 DIAGNOSIS — Z91041 Radiographic dye allergy status: Secondary | ICD-10-CM | POA: Insufficient documentation

## 2011-11-03 DIAGNOSIS — J438 Other emphysema: Secondary | ICD-10-CM | POA: Insufficient documentation

## 2011-11-03 DIAGNOSIS — R079 Chest pain, unspecified: Principal | ICD-10-CM | POA: Insufficient documentation

## 2011-11-03 DIAGNOSIS — I442 Atrioventricular block, complete: Secondary | ICD-10-CM | POA: Insufficient documentation

## 2011-11-03 DIAGNOSIS — E1151 Type 2 diabetes mellitus with diabetic peripheral angiopathy without gangrene: Secondary | ICD-10-CM | POA: Diagnosis present

## 2011-11-03 DIAGNOSIS — R0989 Other specified symptoms and signs involving the circulatory and respiratory systems: Secondary | ICD-10-CM | POA: Insufficient documentation

## 2011-11-03 DIAGNOSIS — F411 Generalized anxiety disorder: Secondary | ICD-10-CM | POA: Insufficient documentation

## 2011-11-03 DIAGNOSIS — J90 Pleural effusion, not elsewhere classified: Secondary | ICD-10-CM | POA: Insufficient documentation

## 2011-11-03 DIAGNOSIS — J189 Pneumonia, unspecified organism: Secondary | ICD-10-CM | POA: Insufficient documentation

## 2011-11-03 DIAGNOSIS — R221 Localized swelling, mass and lump, neck: Secondary | ICD-10-CM | POA: Insufficient documentation

## 2011-11-03 DIAGNOSIS — I498 Other specified cardiac arrhythmias: Secondary | ICD-10-CM | POA: Insufficient documentation

## 2011-11-03 DIAGNOSIS — K118 Other diseases of salivary glands: Secondary | ICD-10-CM | POA: Diagnosis present

## 2011-11-03 DIAGNOSIS — R42 Dizziness and giddiness: Secondary | ICD-10-CM | POA: Insufficient documentation

## 2011-11-03 DIAGNOSIS — J439 Emphysema, unspecified: Secondary | ICD-10-CM | POA: Diagnosis present

## 2011-11-03 DIAGNOSIS — R001 Bradycardia, unspecified: Secondary | ICD-10-CM | POA: Diagnosis not present

## 2011-11-03 DIAGNOSIS — R220 Localized swelling, mass and lump, head: Secondary | ICD-10-CM | POA: Insufficient documentation

## 2011-11-03 HISTORY — DX: Gastro-esophageal reflux disease without esophagitis: K21.9

## 2011-11-03 HISTORY — DX: Syncope and collapse: R55

## 2011-11-03 HISTORY — DX: Abdominal aortic aneurysm, without rupture: I71.4

## 2011-11-03 HISTORY — DX: Anxiety disorder, unspecified: F41.9

## 2011-11-03 HISTORY — DX: Heart failure, unspecified: I50.9

## 2011-11-03 HISTORY — DX: Other diseases of salivary glands: K11.8

## 2011-11-03 HISTORY — DX: Abdominal aortic aneurysm, without rupture, unspecified: I71.40

## 2011-11-03 LAB — URINALYSIS, ROUTINE W REFLEX MICROSCOPIC
Glucose, UA: NEGATIVE mg/dL
Leukocytes, UA: NEGATIVE
Nitrite: NEGATIVE
Protein, ur: NEGATIVE mg/dL
Urobilinogen, UA: 1 mg/dL (ref 0.0–1.0)

## 2011-11-03 LAB — CBC
HCT: 33.5 % — ABNORMAL LOW (ref 36.0–46.0)
Hemoglobin: 11.2 g/dL — ABNORMAL LOW (ref 12.0–15.0)
Hemoglobin: 11 g/dL — ABNORMAL LOW (ref 12.0–15.0)
MCH: 33.4 pg (ref 26.0–34.0)
MCHC: 33.7 g/dL (ref 30.0–36.0)
MCHC: 32.8 g/dL (ref 30.0–36.0)
MCV: 101.8 fL — ABNORMAL HIGH (ref 78.0–100.0)
RBC: 3.29 MIL/uL — ABNORMAL LOW (ref 3.87–5.11)

## 2011-11-03 LAB — CREATININE, SERUM: Creatinine, Ser: 0.78 mg/dL (ref 0.50–1.10)

## 2011-11-03 LAB — GLUCOSE, CAPILLARY: Glucose-Capillary: 124 mg/dL — ABNORMAL HIGH (ref 70–99)

## 2011-11-03 LAB — BASIC METABOLIC PANEL
GFR calc Af Amer: 70 mL/min — ABNORMAL LOW (ref >90)
GFR calc non Af Amer: 60 mL/min — ABNORMAL LOW (ref >90)
Glucose, Bld: 110 mg/dL — ABNORMAL HIGH (ref 70–99)
Potassium: 3.7 mEq/L (ref 3.5–5.1)
Sodium: 142 mEq/L (ref 135–145)

## 2011-11-03 LAB — CARDIAC PANEL(CRET KIN+CKTOT+MB+TROPI)
CK, MB: 2 ng/mL (ref 0.3–4.0)
Relative Index: INVALID (ref 0.0–2.5)
Total CK: 32 U/L (ref 7–177)
Troponin I: <0.3 ng/mL (ref <0.30)

## 2011-11-03 LAB — PRO B NATRIURETIC PEPTIDE: Pro B Natriuretic peptide (BNP): 266.8 pg/mL (ref 0–450)

## 2011-11-03 MED ORDER — SODIUM CHLORIDE 0.9 % IJ SOLN: 3.0000 mL | INTRAMUSCULAR | Status: DC | PRN

## 2011-11-03 MED ORDER — LISINOPRIL 5 MG PO TABS
5.0000 mg | ORAL_TABLET | Freq: Every day | ORAL | Status: DC
  Administered 2011-11-03 – 2011-11-05 (×3): 5 mg via ORAL
  Filled 2011-11-03 (×4): qty 1

## 2011-11-03 MED ORDER — ONDANSETRON HCL 4 MG/2ML IJ SOLN
4.0000 mg | Freq: Four times a day (QID) | INTRAMUSCULAR | Status: DC | PRN
  Administered 2011-11-03: 4 mg via INTRAVENOUS
  Filled 2011-11-03 (×2): qty 2

## 2011-11-03 MED ORDER — SODIUM CHLORIDE 0.9 % IV SOLN
INTRAVENOUS | Status: DC
  Administered 2011-11-03: 10:00:00 via INTRAVENOUS

## 2011-11-03 MED ORDER — SODIUM CHLORIDE 0.9 % IV SOLN: 250.0000 mL | INTRAVENOUS | Status: DC | PRN

## 2011-11-03 MED ORDER — VITAMIN D3 125 MCG (5000 UT) PO CAPS: 1.0000 | ORAL_CAPSULE | Freq: Every day | ORAL | Status: DC

## 2011-11-03 MED ORDER — PREDNISONE 50 MG PO TABS
60.0000 mg | ORAL_TABLET | ORAL | Status: AC
  Administered 2011-11-04: 60 mg via ORAL
  Filled 2011-11-03 (×2): qty 1

## 2011-11-03 MED ORDER — ONDANSETRON HCL 4 MG/2ML IJ SOLN
4.0000 mg | Freq: Once | INTRAMUSCULAR | Status: AC
  Administered 2011-11-03: 4 mg via INTRAVENOUS
  Filled 2011-11-03: qty 2

## 2011-11-03 MED ORDER — FAMOTIDINE 20 MG PO TABS
20.0000 mg | ORAL_TABLET | Freq: Once | ORAL | Status: AC
  Administered 2011-11-03: 20 mg via ORAL

## 2011-11-03 MED ORDER — SODIUM CHLORIDE 0.9 % IJ SOLN: 3.0000 mL | Freq: Two times a day (BID) | INTRAMUSCULAR | Status: DC

## 2011-11-03 MED ORDER — LEVOTHYROXINE SODIUM 150 MCG PO TABS
150.0000 ug | ORAL_TABLET | ORAL | Status: DC
  Administered 2011-11-04 – 2011-11-05 (×2): 150 ug via ORAL
  Filled 2011-11-03 (×3): qty 1

## 2011-11-03 MED ORDER — VITAMIN D3 25 MCG (1000 UNIT) PO TABS
5000.0000 [IU] | ORAL_TABLET | Freq: Every day | ORAL | Status: DC
  Administered 2011-11-03 – 2011-11-04 (×2): 5000 [IU] via ORAL
  Filled 2011-11-03 (×3): qty 5

## 2011-11-03 MED ORDER — ACETAMINOPHEN 325 MG PO TABS: 650.0000 mg | ORAL_TABLET | ORAL | Status: DC | PRN

## 2011-11-03 MED ORDER — FAMOTIDINE 20 MG PO TABS
20.0000 mg | ORAL_TABLET | ORAL | Status: AC
  Filled 2011-11-03: qty 1

## 2011-11-03 MED ORDER — PREDNISONE 50 MG PO TABS
60.0000 mg | ORAL_TABLET | ORAL | Status: AC
  Administered 2011-11-04: 60 mg via ORAL
  Filled 2011-11-03: qty 1

## 2011-11-03 MED ORDER — HYDROCODONE-ACETAMINOPHEN 5-325 MG PO TABS: 1.0000 | ORAL_TABLET | Freq: Three times a day (TID) | ORAL | Status: DC | PRN

## 2011-11-03 MED ORDER — FAMOTIDINE IN NACL 20-0.9 MG/50ML-% IV SOLN
20.0000 mg | INTRAVENOUS | Status: AC
  Administered 2011-11-04: 20 mg via INTRAVENOUS
  Filled 2011-11-03: qty 50

## 2011-11-03 MED ORDER — ALPRAZOLAM 0.5 MG PO TABS
0.5000 mg | ORAL_TABLET | Freq: Two times a day (BID) | ORAL | Status: DC
  Administered 2011-11-03 – 2011-11-04 (×3): 1 mg via ORAL
  Filled 2011-11-03 (×3): qty 2
  Filled 2011-11-03: qty 4

## 2011-11-03 MED ORDER — SODIUM CHLORIDE 0.9 % IJ SOLN
3.0000 mL | Freq: Two times a day (BID) | INTRAMUSCULAR | Status: DC
  Administered 2011-11-04: 3 mL via INTRAVENOUS

## 2011-11-03 MED ORDER — NITROGLYCERIN 0.4 MG SL SUBL: 0.4000 mg | SUBLINGUAL_TABLET | SUBLINGUAL | Status: DC | PRN

## 2011-11-03 MED ORDER — ASPIRIN 325 MG PO TABS
325.0000 mg | ORAL_TABLET | Freq: Every day | ORAL | Status: DC
  Filled 2011-11-03 (×2): qty 1

## 2011-11-03 MED ORDER — HEPARIN SODIUM (PORCINE) 5000 UNIT/ML IJ SOLN
5000.0000 [IU] | Freq: Three times a day (TID) | INTRAMUSCULAR | Status: DC
  Administered 2011-11-03: 5000 [IU] via SUBCUTANEOUS
  Filled 2011-11-03 (×6): qty 1

## 2011-11-03 MED ORDER — BUPROPION HCL ER (XL) 300 MG PO TB24
300.0000 mg | ORAL_TABLET | ORAL | Status: DC
  Administered 2011-11-04 – 2011-11-05 (×2): 300 mg via ORAL
  Filled 2011-11-03 (×3): qty 1

## 2011-11-03 MED ORDER — SODIUM CHLORIDE 0.9 % IV SOLN: INTRAVENOUS | Status: DC

## 2011-11-03 MED ORDER — FUROSEMIDE 20 MG PO TABS
20.0000 mg | ORAL_TABLET | ORAL | Status: DC
  Administered 2011-11-04: 20 mg via ORAL
  Filled 2011-11-03 (×3): qty 1

## 2011-11-03 MED ORDER — DIPHENHYDRAMINE HCL 50 MG/ML IJ SOLN
25.0000 mg | INTRAMUSCULAR | Status: AC
  Administered 2011-11-04: 25 mg via INTRAVENOUS
  Filled 2011-11-03: qty 1

## 2011-11-03 NOTE — ED Notes (Signed)
Family at bedside. 

## 2011-11-03 NOTE — ED Notes (Signed)
Pt refused to go to xr until she received something for nausea, pt sts "I have taken a turn for the worse, I just don't feel like going right now."

## 2011-11-03 NOTE — Consult Note (Signed)
CARDIOLOGY CONSULT NOTE  Patient ID: Paula [name] MRN: [id number], DOB/AGE: 01-18-1927   Admit date: 11/03/2011 Date of Consult: 11/03/2011   Primary Physician: Georgianne Fick, MD, MD Primary Cardiologist: Lovena Neighbours  Pt. Profile:   77 y/o female with several recent hospitalizations for pna, facial twitching, presyncope, chest pain (negative MV earlier this month), heart block (09/2011 - felt to be vasovagal); recently d/c'd from [facility] on 12/17 who presented to ED earlier today feeling "sick."  Problem List: Past Medical History  Diagnosis Date  . Diabetes mellitus   . Thyroid disease   . Abdominal aneurysm   . COPD (chronic obstructive pulmonary disease)   . Hypertension   . Shortness of breath   . Hypothyroidism   . Depression   . Recurrent upper respiratory infection (URI)   . Seizures   . Ejection fraction     NL EF 10/2011 echo  . Abnormal EKG     November, 2012  . Contrast media allergy     Patient feels poorly with contrast  . AAA (abdominal aortic aneurysm)     10/2011 - 5.4cm - scheduled to f/u Dr. Hart [place]  . Pneumonia     10/2011  . Pre-syncope     vasovagal w/ heart block  . Parotid mass     has refused further eval 10/2011  . Chest pain at rest     Myoview 10/14/2011: ef 60%, ? dist anteroseptal scar w/ sublte peri-infarct ischemia vs. attenuation    Past Surgical History  Procedure Date  . Cholecystectomy   . Joint replacement   . Sinus surgery with instatrak   . Appendectomy   . Oophorectomy   . Arthroscopic repair acl      Allergies:  Allergies  Allergen Reactions  . Contrast Media (Iodinated Diagnostic Agents) Anaphylaxis  . Iohexol      Desc: PT STATES SHE HAD A SEVERE REACTION TO IV CONRAST WITH THROAT SWELLING AND SOB. SHE WAS ADMITTED TO THE HOSPITAL. SHE HAS NEVER HAD CONTRAST AGAIN.   Marland Kitchen Penicillins Rash  . Sulfa Antibiotics Anaphylaxis    HPI:   77 y/o female with the above complex problem list.  She has had multiple  hospitalizations over the last few months.  In early November she was seen for presyncope and CHB, which was felt to be 2/2 a vasovagal episode with prolonged P-P intervals and transient A-V dissociation.  Pt was initially treated with a temporary wire but after EP review, it was felt that PPM was not indicated.  Echo @ that time showed and EF of 40% and though ECG was initially normal, on 11/12, she was found to have deep anterolateral TWI.  She was eventually discharged w/o further eval but was readmitted on 11/25 w/ CHF exacerbation and was later d/c 11/26.  She returned to the hospital in early Dec w/ facial twitching.  Cardiology was consulted @ the time and based on abnl ecg, underwent a myoview showing antlat scar w/ ? Peri-infarct ischemia vs. Attenuation.  This was felt to be a low-risk study, and based on findings, contrast allergy, and overall wish to avoid cath, pt was d/c'd.  She was later readmitted w/ pna, treated and d/c on 12/17.  She was again seen in the ED on 12/20 2/2 presyncope and was incidentally noted to have a 5.4cm AAA on a non-contrast CT.  She was seen by vasc surgery @ that time w/ recommendation for outpt f/u.    Pt felt well yesterday, even baked  2 pies, but this am, she awoke "feeling sick."  She describes general malaise, head-toe weakness, nausea w/o vomiting, dizziness, and mild chest and epigastric discomfort.  She called EMS and on their advice, was taken to the ED.  Here, her ECG cont to show antlat twi, not significantly changed from prior ECG's.  She is resting comfortably but still c/o feeling "sick."  Lab work up to this point has been unrevealing.   Home Medications:   Prior to Admission medications   Medication Sig Start Date End Date Taking? Authorizing Provider  ALPRAZolam Prudy Feeler) 0.5 MG tablet Take 0.5-1 mg by mouth 2 (two) times daily. Takes 1 tab in the am & 2 tabs at night   Yes Historical Provider, MD  aspirin 325 MG tablet Take 325 mg by mouth daily.    10/14/11 10/13/12 Yes Michelle A. Matthews  benzonatate (TESSALON PERLES) 100 MG capsule Take 1 capsule (100 mg total) by mouth 3 (three) times daily as needed for cough. 10/28/11 11/04/11 Yes Ripudeep Jenna Luo, MD  buPROPion (WELLBUTRIN XL) 300 MG 24 hr tablet Take 300 mg by mouth every morning.     Yes Historical Provider, MD  Cholecalciferol (VITAMIN D3) 5000 UNITS CAPS Take 1 capsule by mouth daily.     Yes Historical Provider, MD  furosemide (LASIX) 20 MG tablet Take 20 mg by mouth every morning.   10/14/11 10/13/12 Yes Michelle A. Matthews  guaiFENesin (MUCINEX) 600 MG 12 hr tablet Take 1 tablet (600 mg total) by mouth 2 (two) times daily. 10/28/11 10/27/12 Yes Ripudeep Jenna Luo, MD  HYDROcodone-acetaminophen (NORCO) 5-325 MG per tablet Take 1 tablet by mouth every 8 (eight) hours as needed for pain. 10/28/11 11/07/11 Yes Ripudeep Jenna Luo, MD  levothyroxine (SYNTHROID, LEVOTHROID) 150 MCG tablet Take 150 mcg by mouth every morning.     Yes Historical Provider, MD  lisinopril (PRINIVIL,ZESTRIL) 5 MG tablet Take 1 tablet (5 mg total) by mouth daily. 10/07/11 10/06/12 Yes Nishant Dhungel, MD            Family History  Problem Relation Age of Onset  . Cancer Father      History   Social History  . Marital Status: Married    Spouse Name: N/A    Number of Children: N/A  . Years of Education: N/A   Occupational History  . Not on file.   Social History Main Topics  . Smoking status: Former Smoker    Quit date: 08/31/2011  . Smokeless tobacco: Not on file  . Alcohol Use: No  . Drug Use: No  . Sexually Active:    Other Topics Concern  . Not on file   Social History Narrative  . No narrative on file     Review of Systems: General: negative for chills, fever, night sweats or weight changes.  +++for general malaise.  Cardiovascular: +++ for chest pain.  No dyspnea on exertion, edema, orthopnea, palpitations, paroxysmal nocturnal dyspnea. Dermatological: negative for rash.  Has had  dizziness. Respiratory: negative for cough or wheezing Urologic: negative for hematuria Abdominal: +++ for nausea.  No vomiting, diarrhea, bright red blood per rectum, melena, or hematemesis Neurologic: negative for visual changes, syncope, or dizziness All other systems reviewed and are otherwise negative except as noted above.  Physical Exam: Blood pressure 139/78, pulse 85, temperature 99.2 F (37.3 C), temperature source Oral, resp. rate 22, SpO2 95.00%.  General: Well developed, well nourished, in no acute distress. Head: Normocephalic, atraumatic, sclera non-icteric, no xanthomas, nares are  without discharge.  Neck: Supple without bruits or JVD. Lungs:  Resp regular and unlabored, CTA. Heart: RRR no s3, s4, or murmurs. Abdomen: Soft, non-tender, non-distended, BS + x 4.  Msk:  Strength and tone appears normal for age. Extremities: No clubbing, cyanosis or edema. DP/PT/Radials 2+ and equal bilaterally. Neuro: Alert and oriented X 3. Moves all extremities spontaneously. Psych: Normal affect.   Labs:   Results for orders placed during the hospital encounter of 11/03/11 (from the past 72 hour(s))  PRO B NATRIURETIC PEPTIDE     Status: Normal   Collection Time   11/03/11  9:09 AM      Component Value Range Comment   Pro B Natriuretic peptide (BNP) 266.8  0 - 450 (pg/mL)   CBC     Status: Abnormal   Collection Time   11/03/11  9:10 AM      Component Value Range Comment   WBC 7.9  4.0 - 10.5 (K/uL)    RBC 3.26 (*) 3.87 - 5.11 (MIL/uL)    Hemoglobin 11.2 (*) 12.0 - 15.0 (g/dL)    HCT 16.1 (*) 09.6 - 46.0 (%)    MCV 101.8 (*) 78.0 - 100.0 (fL)    MCH 34.4 (*) 26.0 - 34.0 (pg)    MCHC 33.7  30.0 - 36.0 (g/dL)    RDW 04.5  40.9 - 81.1 (%)    Platelets 288  150 - 400 (K/uL)   BASIC METABOLIC PANEL     Status: Abnormal   Collection Time   11/03/11  9:10 AM      Component Value Range Comment   Sodium 142  135 - 145 (mEq/L)    Potassium 3.7  3.5 - 5.1 (mEq/L)    Chloride 105   96 - 112 (mEq/L)    CO2 29  19 - 32 (mEq/L)    Glucose, Bld 110 (*) 70 - 99 (mg/dL)    BUN 6  6 - 23 (mg/dL)    Creatinine, Ser 9.14  0.50 - 1.10 (mg/dL)    Calcium 9.6  8.4 - 10.5 (mg/dL)    GFR calc non Af Amer 60 (*) >90 (mL/min)    GFR calc Af Amer 70 (*) >90 (mL/min)   TROPONIN I     Status: Normal   Collection Time   11/03/11  9:10 AM      Component Value Range Comment   Troponin I <0.30  <0.30 (ng/mL)   URINALYSIS, ROUTINE W REFLEX MICROSCOPIC     Status: Normal   Collection Time   11/03/11 10:55 AM      Component Value Range Comment   Color, Urine YELLOW  YELLOW     APPearance CLEAR  CLEAR     Specific Gravity, Urine 1.014  1.005 - 1.030     pH 7.0  5.0 - 8.0     Glucose, UA NEGATIVE  NEGATIVE (mg/dL)    Hgb urine dipstick NEGATIVE  NEGATIVE     Bilirubin Urine NEGATIVE  NEGATIVE     Ketones, ur NEGATIVE  NEGATIVE (mg/dL)    Protein, ur NEGATIVE  NEGATIVE (mg/dL)    Urobilinogen, UA 1.0  0.0 - 1.0 (mg/dL)    Nitrite NEGATIVE  NEGATIVE     Leukocytes, UA NEGATIVE  NEGATIVE  MICROSCOPIC NOT DONE ON URINES WITH NEGATIVE PROTEIN, BLOOD, LEUKOCYTES, NITRITE, OR GLUCOSE <1000 mg/dL.       EKG:  RSR, 1st degree AVB, TWI V2-V5 - less pronounced than on prior ecg's  ASSESSMENT AND PLAN:  1.  Chest pain:  In setting of multiple vague complaints.  Has had this intermittently and had a low-risk myoview earlier this month.  Her ECG has been abnormal dating back to 09/23/2011 (was normal 4 days before).  At this point, we plan to admit the pt, cycle CE (neg so far), and will arrange for diag cath tomorrow.  Pt is agreeable.  As she does have a reported severe contrast allergy, we will begin pre-treatment today.  2.  AAA:  5.4 cm.  Pt wishes to avoid femoral approach if at all possible on cath.    3.  H/O CHB:  Felt to be secondary to vasovagal rxn.  ? If this may be playing a role in dizzy spells @ home.  Follow on monitor.  4.  HTN:  Stable.  Signed, Nicolasa Ducking,  NP 11/03/2011, 1:08 PM Patient seen and examined by me. Her symptoms are not classic and her history is somewhat tangential and rambling.  She has had multiple recent ER visits and hospitalizations for a variety of complaints including facial drooping, presyncope, CHF, pneumonia.  Today presents with substernal heaviness with questionable left arm radiation. Exam reveals clear lungs to auscultation.  Heart reveals S4 gallop and no rub. Abdomen nontender [known AAA]. Radial and pedal pulses are strong. No edema. EKG again shows anterior T wave inversion although not as severe as prior EKGs.  Recent Myoview could not exclude ischemia.  Plan will be to cycle enzymes and plan for diagnostic cath in am with pre-treatment for dye allergy to start today. Anticipate radial approach. Cassell Clement

## 2011-11-03 NOTE — ED Notes (Signed)
Pt took asa at home, and was given nitro sl x1 by ems

## 2011-11-03 NOTE — ED Notes (Signed)
Zawkowski EDP at bedside.

## 2011-11-03 NOTE — ED Notes (Addendum)
Pt c/o waking this am 0600 w/generalized chest pressure, nausea, weakness and dizziness. sts she was dx w/pneumonia and admitted to our facility Fri. 12/15 and dc'd home Mon. 12/17, pt returned to ED on Thurs. 12/20 chest pain and dc'd home.

## 2011-11-03 NOTE — ED Provider Notes (Signed)
History     CSN: [id number]  Arrival date & time 11/03/11  0808   First MD Initiated Contact with Patient 11/03/11 [phone]      Chief Complaint  Patient presents with  . Chest Pain    (Consider location/radiation/quality/duration/timing/severity/associated sxs/prior treatment) The history is provided by the patient.   patient is a 77-year-old female with onset of substernal dull achy chest pain at 6 in the morning associated with some dizziness and nausea she took 325 mg of aspirin at home and EMS gave her nitroglycerin which only caused headache the chest pain radiated to her left. Nursing reported that chest pain now resolved before EMS arrived however patient still has dull substernal chest pain and continues.  Patient with frequent ED visits this month. Seen on the 20th for near syncope and discharged home. Seen on the 14th for shortness of breath and admitted for healthcare associated pneumonia, discharged 4 days ago, seen on December 1 facial droop and was admitted for questionable stroke.   Patient has significant risk factors for coronary artery disease that include diabetes hypertension, she also is known to have an abdominal aortic aneurysm there is no pain in that area. Patient denies having a cardiac catheterization, denies heart surgery or stents.   Chest pain is substernal radiates to left arm patient states the pain is about 2-4/10. Nitroglycerin made no significant difference.  Past Medical History  Diagnosis Date  . Diabetes mellitus   . Thyroid disease   . Abdominal aneurysm   . COPD (chronic obstructive pulmonary disease)   . Hypertension   . Shortness of breath   . Hypothyroidism   . Depression   . Recurrent upper respiratory infection (URI)   . Seizures   . Ejection fraction     EF 40%, echo, September 20, 2011, severe hypokinesis of the entire apex  . Abnormal EKG     November, 2012  . Contrast media allergy     Patient feels poorly with contrast  . AAA  (abdominal aortic aneurysm)     10/2011 - 5.4cm - scheduled to f/u Dr. Hart [place]  . Pneumonia     10/2011  . Pre-syncope   . Parotid mass     has refused further eval 10/2011    Past Surgical History  Procedure Date  . Cholecystectomy   . Joint replacement   . Sinus surgery with instatrak   . Appendectomy   . Oophorectomy   . Arthroscopic repair acl     Family History  Problem Relation Age of Onset  . Cancer Father     History  Substance Use Topics  . Smoking status: Former Smoker    Quit date: 08/31/2011  . Smokeless tobacco: Not on file  . Alcohol Use: No    OB History    Grav Para Term Preterm Abortions TAB SAB Ect Mult Living                  Review of Systems  Constitutional: Negative for fever.  HENT: Negative for neck pain.   Eyes: Negative for visual disturbance.  Respiratory: Positive for chest tightness.   Cardiovascular: Positive for chest pain.  Gastrointestinal: Positive for nausea. Negative for vomiting and abdominal pain.  Genitourinary: Negative for dysuria.  Musculoskeletal: Negative for back pain.  Skin: Negative for rash.  Neurological: Positive for dizziness and headaches.  Hematological: Does not bruise/bleed easily.    Allergies  Contrast media; Iohexol; Penicillins; and Sulfa antibiotics  Home Medications   Current  Outpatient Rx  Name Route Sig Dispense Refill  . ALPRAZOLAM 0.5 MG PO TABS Oral Take 0.5-1 mg by mouth 2 (two) times daily. Takes 1 tab in the am & 2 tabs at night    . ASPIRIN 325 MG PO TABS Oral Take 325 mg by mouth daily.      Marland Kitchen BENZONATATE 100 MG PO CAPS Oral Take 1 capsule (100 mg total) by mouth 3 (three) times daily as needed for cough. 30 capsule 0  . BUPROPION HCL ER (XL) 300 MG PO TB24 Oral Take 300 mg by mouth every morning.      Marland Kitchen VITAMIN D3 5000 UNITS PO CAPS Oral Take 1 capsule by mouth daily.      . FUROSEMIDE 20 MG PO TABS Oral Take 20 mg by mouth every morning.      . GUAIFENESIN ER 600 MG PO TB12 Oral  Take 1 tablet (600 mg total) by mouth 2 (two) times daily. 30 tablet 0  . HYDROCODONE-ACETAMINOPHEN 5-325 MG PO TABS Oral Take 1 tablet by mouth every 8 (eight) hours as needed for pain. 30 tablet 0  . LEVOTHYROXINE SODIUM 150 MCG PO TABS Oral Take 150 mcg by mouth every morning.      Marland Kitchen LISINOPRIL 5 MG PO TABS Oral Take 1 tablet (5 mg total) by mouth daily. 30 tablet 0  . MOXIFLOXACIN HCL 400 MG PO TABS Oral Take 1 tablet (400 mg total) by mouth daily. 7 tablet 0    BP 139/78  Pulse 85  Temp(Src) 99.2 F (37.3 C) (Oral)  Resp 22  SpO2 95%  Physical Exam  Nursing note and vitals reviewed. Constitutional: She is oriented to person, place, and time. She appears well-developed and well-nourished. No distress.  HENT:  Head: Normocephalic and atraumatic.  Mouth/Throat: Oropharynx is clear and moist.  Eyes: Conjunctivae and EOM are normal. Pupils are equal, round, and reactive to light.  Neck: Normal range of motion. Neck supple.  Cardiovascular: Normal rate, regular rhythm and normal heart sounds.   No murmur heard. Pulmonary/Chest: Effort normal and breath sounds normal. No respiratory distress. She has no rales.  Abdominal: Soft. Bowel sounds are normal. There is no tenderness.  Musculoskeletal: Normal range of motion.  Neurological: She is alert and oriented to person, place, and time. No cranial nerve deficit. She exhibits normal muscle tone. Coordination normal.  Skin: Skin is warm. No rash noted.    ED Course  Procedures (including critical care time)  Labs Reviewed  CBC - Abnormal; Notable for the following:    RBC 3.26 (*)    Hemoglobin 11.2 (*)    HCT 33.2 (*)    MCV 101.8 (*)    MCH 34.4 (*)    All other components within normal limits  BASIC METABOLIC PANEL - Abnormal; Notable for the following:    Glucose, Bld 110 (*)    GFR calc non Af Amer 60 (*)    GFR calc Af Amer 70 (*)    All other components within normal limits  PRO B NATRIURETIC PEPTIDE  TROPONIN I    URINALYSIS, ROUTINE W REFLEX MICROSCOPIC   Dg Chest 2 View  11/03/2011  *RADIOLOGY REPORT*  Clinical Data: Chest pain  CHEST - 2 VIEW  Comparison: Chest radiograph 10/12/2011  Findings: Normal cardiac silhouette.  Lungs are hyperinflated. Calcified granuloma in the left lung.  There is small left effusion.  Lungs are hyperinflated.  IMPRESSION:  1.  Small left effusion. 2.  Hyperinflated lungs.  Original  Report Authenticated By: Genevive Bi, M.D.   Results for orders placed during the hospital encounter of 11/03/11  PRO B NATRIURETIC PEPTIDE      Component Value Range   Pro B Natriuretic peptide (BNP) 266.8  0 - 450 (pg/mL)  CBC      Component Value Range   WBC 7.9  4.0 - 10.5 (K/uL)   RBC 3.26 (*) 3.87 - 5.11 (MIL/uL)   Hemoglobin 11.2 (*) 12.0 - 15.0 (g/dL)   HCT 16.1 (*) 09.6 - 46.0 (%)   MCV 101.8 (*) 78.0 - 100.0 (fL)   MCH 34.4 (*) 26.0 - 34.0 (pg)   MCHC 33.7  30.0 - 36.0 (g/dL)   RDW 04.5  40.9 - 81.1 (%)   Platelets 288  150 - 400 (K/uL)  BASIC METABOLIC PANEL      Component Value Range   Sodium 142  135 - 145 (mEq/L)   Potassium 3.7  3.5 - 5.1 (mEq/L)   Chloride 105  96 - 112 (mEq/L)   CO2 29  19 - 32 (mEq/L)   Glucose, Bld 110 (*) 70 - 99 (mg/dL)   BUN 6  6 - 23 (mg/dL)   Creatinine, Ser 9.14  0.50 - 1.10 (mg/dL)   Calcium 9.6  8.4 - 78.2 (mg/dL)   GFR calc non Af Amer 60 (*) >90 (mL/min)   GFR calc Af Amer 70 (*) >90 (mL/min)  TROPONIN I      Component Value Range   Troponin I <0.30  <0.30 (ng/mL)  URINALYSIS, ROUTINE W REFLEX MICROSCOPIC      Component Value Range   Color, Urine YELLOW  YELLOW    APPearance CLEAR  CLEAR    Specific Gravity, Urine 1.014  1.005 - 1.030    pH 7.0  5.0 - 8.0    Glucose, UA NEGATIVE  NEGATIVE (mg/dL)   Hgb urine dipstick NEGATIVE  NEGATIVE    Bilirubin Urine NEGATIVE  NEGATIVE    Ketones, ur NEGATIVE  NEGATIVE (mg/dL)   Protein, ur NEGATIVE  NEGATIVE (mg/dL)   Urobilinogen, UA 1.0  0.0 - 1.0 (mg/dL)   Nitrite NEGATIVE   NEGATIVE    Leukocytes, UA NEGATIVE  NEGATIVE     Date: 11/03/2011  Rate: 88  Rhythm: normal sinus rhythm  QRS Axis: normal  Intervals: normal  ST/T Wave abnormalities: nonspecific T wave changes  Conduction Disutrbances:first-degree A-V block   Narrative Interpretation:   Old EKG Reviewed: unchanged No significant change in EKG from 10/31/2011   1. Chest pain       MDM   The concerning substernal chest pain not severe somewhat vague with also some left arm pain starting at about 6:00 this morning discussed with on call cardiology for Dr. Myrtis Ser group they will see her in the emergency department in consultation. Initial troponin is normal. Patient does have significant risk factors. Recent pneumonia not evident on today's chest x-ray but the result of a left effusion.          Shelda Jakes, MD 11/03/11 1351

## 2011-11-03 NOTE — ED Notes (Signed)
Per ems pt here for chest pain radiaitng to left arm. Started this am relieved before ems arrived on scene. withems given ekg, asa, 12 lead, and ntg.

## 2011-11-04 ENCOUNTER — Other Ambulatory Visit: Payer: Self-pay

## 2011-11-04 ENCOUNTER — Encounter (HOSPITAL_COMMUNITY)
Admission: EM | Disposition: A | Discharge status: Home / Self Care | Payer: Self-pay | Source: Ambulatory Visit | Attending: Emergency Medicine

## 2011-11-04 DIAGNOSIS — I2510 Atherosclerotic heart disease of native coronary artery without angina pectoris: Secondary | ICD-10-CM

## 2011-11-04 HISTORY — PX: LEFT HEART CATHETERIZATION WITH CORONARY ANGIOGRAM: SHX5451

## 2011-11-04 HISTORY — PX: CARDIAC CATHETERIZATION: SHX172

## 2011-11-04 LAB — CBC
HCT: 36.4 % (ref 36.0–46.0)
Hemoglobin: 11.1 g/dL — ABNORMAL LOW (ref 12.0–15.0)
MCH: 33.9 pg (ref 26.0–34.0)
MCHC: 33.2 g/dL (ref 30.0–36.0)
MCV: 102.7 fL — ABNORMAL HIGH (ref 78.0–100.0)
MCV: 102 fL — ABNORMAL HIGH (ref 78.0–100.0)
Platelets: 269 10*3/uL (ref 150–400)
Platelets: 293 10*3/uL (ref 150–400)
RBC: 3.34 MIL/uL — ABNORMAL LOW (ref 3.87–5.11)
RDW: 14 % (ref 11.5–15.5)
WBC: 6.8 10*3/uL (ref 4.0–10.5)
WBC: 6.8 10*3/uL (ref 4.0–10.5)

## 2011-11-04 LAB — CREATININE, SERUM: GFR calc Af Amer: 77 mL/min — ABNORMAL LOW (ref >90)

## 2011-11-04 LAB — GLUCOSE, CAPILLARY
Glucose-Capillary: 140 mg/dL — ABNORMAL HIGH (ref 70–99)
Glucose-Capillary: 305 mg/dL — ABNORMAL HIGH (ref 70–99)

## 2011-11-04 LAB — BASIC METABOLIC PANEL
CO2: 24 mEq/L (ref 19–32)
Chloride: 110 mEq/L (ref 96–112)
Creatinine, Ser: 0.77 mg/dL (ref 0.50–1.10)
Glucose, Bld: 131 mg/dL — ABNORMAL HIGH (ref 70–99)

## 2011-11-04 LAB — LIPID PANEL
Cholesterol: 144 mg/dL (ref 0–200)
HDL: 38 mg/dL — ABNORMAL LOW (ref >39)
Triglycerides: 129 mg/dL (ref <150)
VLDL: 26 mg/dL (ref 0–40)

## 2011-11-04 LAB — CARDIAC PANEL(CRET KIN+CKTOT+MB+TROPI): Troponin I: <0.3 ng/mL (ref <0.30)

## 2011-11-04 LAB — PROTIME-INR: INR: 1.22 (ref 0.00–1.49)

## 2011-11-04 SURGERY — LEFT HEART CATHETERIZATION WITH CORONARY ANGIOGRAM
Anesthesia: LOCAL

## 2011-11-04 MED ORDER — POTASSIUM CHLORIDE 20 MEQ/15ML (10%) PO LIQD
ORAL | Status: AC
  Filled 2011-11-04: qty 30

## 2011-11-04 MED ORDER — SODIUM CHLORIDE 0.9 % IV SOLN: 1.0000 mL/kg/h | INTRAVENOUS | Status: AC

## 2011-11-04 MED ORDER — POTASSIUM CHLORIDE CRYS ER 20 MEQ PO TBCR
40.0000 meq | EXTENDED_RELEASE_TABLET | Freq: Once | ORAL | Status: AC
  Administered 2011-11-04: 40 meq via ORAL
  Filled 2011-11-04: qty 2

## 2011-11-04 MED ORDER — METHYLPREDNISOLONE SODIUM SUCC 125 MG IJ SOLR
INTRAMUSCULAR | Status: AC
  Filled 2011-11-04: qty 2

## 2011-11-04 MED ORDER — ACETAMINOPHEN 325 MG PO TABS: 650.0000 mg | ORAL_TABLET | ORAL | Status: DC | PRN

## 2011-11-04 MED ORDER — VERAPAMIL HCL 2.5 MG/ML IV SOLN
INTRAVENOUS | Status: AC
  Filled 2011-11-04: qty 2

## 2011-11-04 MED ORDER — ASPIRIN 81 MG PO CHEW
81.0000 mg | CHEWABLE_TABLET | Freq: Every day | ORAL | Status: DC
  Filled 2011-11-04: qty 1

## 2011-11-04 MED ORDER — ROSUVASTATIN CALCIUM 10 MG PO TABS
10.0000 mg | ORAL_TABLET | Freq: Every day | ORAL | Status: DC
  Administered 2011-11-04: 10 mg via ORAL
  Filled 2011-11-04 (×2): qty 1

## 2011-11-04 MED ORDER — HEPARIN (PORCINE) IN NACL 2-0.9 UNIT/ML-% IJ SOLN
INTRAMUSCULAR | Status: AC
  Filled 2011-11-04: qty 2000

## 2011-11-04 MED ORDER — HEPARIN SODIUM (PORCINE) 5000 UNIT/ML IJ SOLN
5000.0000 [IU] | Freq: Three times a day (TID) | INTRAMUSCULAR | Status: DC
  Filled 2011-11-04 (×4): qty 1

## 2011-11-04 MED ORDER — LIDOCAINE HCL (PF) 1 % IJ SOLN
INTRAMUSCULAR | Status: AC
  Filled 2011-11-04: qty 30

## 2011-11-04 MED ORDER — HEPARIN SODIUM (PORCINE) 1000 UNIT/ML IJ SOLN
INTRAMUSCULAR | Status: AC
  Filled 2011-11-04: qty 1

## 2011-11-04 MED ORDER — FENTANYL CITRATE 0.05 MG/ML IJ SOLN
INTRAMUSCULAR | Status: AC
  Filled 2011-11-04: qty 2

## 2011-11-04 MED ORDER — ONDANSETRON HCL 4 MG/2ML IJ SOLN: 4.0000 mg | Freq: Four times a day (QID) | INTRAMUSCULAR | Status: DC | PRN

## 2011-11-04 MED ORDER — NITROGLYCERIN 0.2 MG/ML ON CALL CATH LAB
INTRAVENOUS | Status: AC
  Filled 2011-11-04: qty 1

## 2011-11-04 MED ORDER — MIDAZOLAM HCL 2 MG/2ML IJ SOLN
INTRAMUSCULAR | Status: AC
  Filled 2011-11-04: qty 2

## 2011-11-04 NOTE — Progress Notes (Signed)
TR BAND REMOVAL  LOCATION:  right radial  DEFLATED PER PROTOCOL:  yes  TIME BAND OFF / DRESSING APPLIED:   1400   SITE UPON ARRIVAL:   Level 0  SITE AFTER BAND REMOVAL:  Level 0  REVERSE ALLEN'S TEST:    positive  CIRCULATION SENSATION AND MOVEMENT:  Within Normal Limits  yes  COMMENTS:     

## 2011-11-04 NOTE — Progress Notes (Signed)
Dr. Gala Romney notified of BS. Made aware that pt is diet controlled at home and she is refusing to receive any insulin coverage. I explained to the pt that she would only be receiving coverage for her high sugars while here in the hospital. That it was not a permanent medication.

## 2011-11-04 NOTE — Procedures (Signed)
   Cardiac Catheterization Procedure Note  Name: Paula [name] MRN: [id number] DOB: [birth year]  Procedure: Selective Coronary Angiography  Indication: Abnormal myoview, atypical chest pain symptoms.  EF decreased by echo.    Procedural details: Informed consent was obtained.  Allen's test on the right wrist was positive.  The patient's right radial area was sterilely prepped and draped and anesthetized with 1% lidocaine.  The right radial artery was engaged with modified Seldinger technique and a 5 French sheath was placed the patient received 3 mg intraarterial verapamil.  There was a complete loop in the right radial artery.  I was able to navigate the loop with the Versicore wire but only for a short distance beyond the loop.  I was unable to follow the wire across the loop with a catheter.  Therefore, attention was turned to the right groin.  The right groin was prepped, draped, and anesthetized with 1% lidocaine. Using modified Seldinger technique, a long 5 French sheath was introduced into the right femoral artery. Given the tortuosity of the iliac arteries (patient also had AAA), engagement of the coronaries was difficult. Catheter exchanges were performed over an exchange-length guidewire.  The right coronary artery was engaged with a JR4 catheter and the left coronary artery was engaged with the EBU 3.5 guide catheter.  LV-gram was not done to limit catheter manipulations.  There were no immediate procedural complications. The patient was transferred to the post catheterization recovery area for further monitoring.  Procedural Findings:   Coronary angiography: Coronary dominance: right  Left mainstem: 20% mid vessel stenosis.  Left anterior descending (LAD): 30% proximal LAD stenosis.  30% distal LAD stenosis.   Left circumflex (LCx): Luminal irregularities.   Right coronary artery (RCA): 30% proximal and 30% mid vessel stenosis.   Final Conclusions:  No obstructive CAD.  Brief run  of Type I 2nd degree AV block noted during study.   Recommendations:   1. Coronary angiography does not show cause for her symptoms.  I would keep her overnight and follow telemetry for bradyarrhythmias.  Can likely discharge home in am with 3 week event monitor if nothing is seen.  2. If future cath is needed, likely would try left radial.  Right radial loop was not able to be navigated with catheter and difficult study from groin due to tortuosity.   Marca Ancona 11/04/2011, 12:18 PM

## 2011-11-04 NOTE — H&P (Signed)
   Dr. Yevonne Pax note from yesterday reviewed.  No changes to history or physical.  Plan for left heart cath today.  Procedure explained to patient.   Marca Ancona 11/04/2011 10:33 AM

## 2011-11-05 ENCOUNTER — Other Ambulatory Visit: Payer: Self-pay

## 2011-11-05 DIAGNOSIS — R079 Chest pain, unspecified: Secondary | ICD-10-CM

## 2011-11-05 LAB — CBC
HCT: 32.3 % — ABNORMAL LOW (ref 36.0–46.0)
Hemoglobin: 10.6 g/dL — ABNORMAL LOW (ref 12.0–15.0)
RBC: 3.13 MIL/uL — ABNORMAL LOW (ref 3.87–5.11)

## 2011-11-05 LAB — GLUCOSE, CAPILLARY: Glucose-Capillary: 107 mg/dL — ABNORMAL HIGH (ref 70–99)

## 2011-11-05 LAB — BASIC METABOLIC PANEL
Chloride: 108 mEq/L (ref 96–112)
GFR calc Af Amer: 67 mL/min — ABNORMAL LOW (ref >90)
GFR calc non Af Amer: 58 mL/min — ABNORMAL LOW (ref >90)
Glucose, Bld: 134 mg/dL — ABNORMAL HIGH (ref 70–99)
Potassium: 4 mEq/L (ref 3.5–5.1)
Sodium: 138 mEq/L (ref 135–145)

## 2011-11-05 NOTE — Progress Notes (Signed)
Patient Name: Paula [name] Date of Encounter: 11/05/2011  Principal Problem:  *Chest pain at rest Active Problems:  COPD (chronic obstructive pulmonary disease) with emphysema  Diabetes mellitus type II, controlled  Aneurysm of abdominal aorta  Parotid mass  Sinus bradycardia  Abnormal EKG  Contrast media allergy    SUBJECTIVE: Patient denies chest pain or shortness of breath. She is and relating well. She complains of fatigue and did not sleep well but otherwise no new complaints.   OBJECTIVE  Filed Vitals:   11/04/11 2200 11/04/11 2310 11/04/11 2316 11/05/11 0400  BP: 125/64 119/63  111/34  Pulse: 76 110  55  Temp:  98.8 F (37.1 C)  99 F (37.2 C)  TempSrc:  Oral  Oral  Resp: 22 17  22   Height:      Weight:   157 lb 13.6 oz (71.6 kg)   SpO2: 97% 92%  93%    Intake/Output Summary (Last 24 hours) at 11/05/11 1610 Last data filed at 11/04/11 2200  Gross per 24 hour  Intake    325 ml  Output      0 ml  Net    325 ml   Weight change: 1 lb 5.2 oz (0.6 kg)  PHYSICAL EXAM  General: Well developed, well nourished, in no acute distress.  Neck: Supple without bruits or JVD. Lungs:  Resp regular and unlabored, few dry rales noted. Heart: RRR no s3, s4, or murmurs. Abdomen: Soft, non-tender, non-distended, BS + x 4.  Extremities: Cath site in the right groin with minimal ecchymosis, no hematoma or bruit. Right radial cath site is without ecchymosis, bruit or hematoma Neuro: Alert and oriented X 3. Moves all extremities spontaneously. Psych: Normal affect.  LABS:  CBC: Basename 11/05/11 0411 11/04/11 1543  WBC 11.2* 6.8  NEUTROABS -- --  HGB 10.6* 12.1  HCT 32.3* 36.4  MCV 103.2* 102.0*  PLT 212 293   Basic Metabolic Panel: Basename 11/05/11 0411 11/04/11 1543 11/04/11 0657  NA 138 -- 142  K 4.0 -- 3.1*  CL 108 -- 110  CO2 21 -- 24  GLUCOSE 134* -- 131*  BUN 12 -- 5*  CREATININE 0.90 0.80 --  CALCIUM 9.6 -- 8.6  MG -- -- --  PHOS -- -- --    Cardiac Enzymes: Basename 11/04/11 0007 11/03/11 1651 11/03/11 0910  CKTOTAL 28 32 --  CKMB 1.9 2.0 --  CKMBINDEX -- -- --  TROPONINI <0.30 <0.30 <0.30   Hemoglobin A1C: No results found for this basename: HGBA1C in the last 72 hours  Fasting Lipid Panel Basename 11/04/11 0657  CHOL 144  HDL 38*  LDLCALC 80  TRIG 960  CHOLHDL 3.8  LDLDIRECT --    TELE: SR, sinus bradycardia with bigeminy PACs  Radiology/Studies:  Dg Chest 2 View  11/03/2011  *RADIOLOGY REPORT*  Clinical Data: Chest pain  CHEST - 2 VIEW  Comparison: Chest radiograph 10/12/2011  Findings: Normal cardiac silhouette.  Lungs are hyperinflated. Calcified granuloma in the left lung.  There is small left effusion.  Lungs are hyperinflated.   IMPRESSION:  1.  Small left effusion. 2.  Hyperinflated lungs.  Original Report Authenticated By: Genevive Bi, M.D.    Current Medications:   . ALPRAZolam  0.5-1 mg Oral BID  . aspirin  81 mg Oral Daily  . buPROPion  300 mg Oral Q0700  . cholecalciferol  5,000 Units Oral Daily  . diphenhydrAMINE  25 mg Intravenous On Call  . famotidine (PEPCID) IV  20 mg Intravenous On Call  . furosemide  20 mg Oral Q0700  . heparin  5,000 Units Subcutaneous Q8H  . levothyroxine  150 mcg Oral Q0700  . lisinopril  5 mg Oral Daily  . methylPREDNISolone sodium succinate      . nitroGLYCERIN      . potassium chloride  40 mEq Oral Once  . predniSONE  60 mg Oral Pre-Cath  . rosuvastatin  10 mg Oral q1800  . sodium chloride  3 mL Intravenous Q12H  . verapamil        ASSESSMENT AND PLAN: 1. chest pain: Nonobstructive coronary artery disease at cath. Medical therapy recommended 2. Second degree AV block, Mobitz 1 during cath: Telemetry reviewed from overnight. She had some sinus bradycardia as well as bigeminy PACs. No second degree block was observed. She is not on any rate lowering medications. This can be followed. M.D. advise on Holter monitor or event monitor to evaluate dizzy  spells/presyncope. 3. 5.4 cm AAA: She has been seen by vascular surgery and is to followup with them as recommended 4. Plan: DC when medically stable, possibly today  Signed, Theodore Demark , PA-C Patient seen and examined. I agree with the assessment and plan as detailed above. See also my additional thoughts below.    The patient is known to have an ejection fraction in the 40% range. This data was obtained by echo several weeks ago. She did not have a left ventriculogram with the current catheterization. She does not have any severe coronary disease. Etiology of her left ventricular dysfunction remains unclear. She is to be followed medically. She can be discharged home with followup with an event recorder and followup with Dr. Myrtis Ser.     Willa Rough, MD, [facility] 11/05/2011 10:40 AM

## 2011-11-05 NOTE — Discharge Summary (Signed)
CARDIOLOGY DISCHARGE SUMMARY   Patient ID: Paula [name] MRN: [id number] DOB/AGE: 17-Aug-1927 77 y.o.  Admit date: 11/03/2011 Discharge date: 11/05/2011  Primary Discharge Diagnosis: Chest pain Secondary Discharge Diagnosis:  Patient Active Problem List  Diagnoses  . COPD (chronic obstructive pulmonary disease) with emphysema  . Diabetes mellitus type II, controlled  . Aneurysm of abdominal aorta  . Pneumonia, organism unspecified  . Episode of dizziness  . Parotid mass  . Hypothyroidism  . Complete heart block  . Sinus bradycardia  . Hypokalemia  . Delirium  . Suspected CHF (congestive heart failure)  . Anxiety state, unspecified  . Congestive heart failure  . Ejection fraction  . Abnormal EKG  . Contrast media allergy  . Facial twitching  . Hospital acquired PNA  . SOB (shortness of breath)  . Depression  . Anxiety  . Chest pain at rest    Significant Diagnostic Studies: Selective Coronary Angiography, two-view chest x-ray  Hospital Course: [name] is an 77 year old female with no previous history of coronary artery disease. She has been to the hospital several times in the last couple of months for pneumonia, presyncope and chest pain. An earlier Myoview was negative. She also had heart block in November of 2012 that was felt to be vasovagal. Her last discharge was 10/28/2011. She came to the emergency room on the day of admission complaining of general malaise, weakness, nausea and chest pain. She was admitted for further evaluation.  Her cardiac enzymes were negative for MI. There was concern for angina, so she was pretreated for dye allergy and taken to the cath lab on 11/04/2011. She had nonobstructive disease between 20 and 30% in all vessels. She had a brief run of Mobitz 1 second-degree AV block during the study but was asymptomatic with this. She was admitted overnight.  She was followed on telemetry overnight. During sleep her heart rate would drop into the  50s and at one point she dropped into the high 40s. She had underlying sinus bradycardia and also PACs. No further second-degree AV block was seen. She is not on rate lowering meds.  On 11/05/2011 Paula [name] was ambulating without chest pain or shortness of breath. Her heart rate was stable and she was having no further symptoms. She is considered stable for discharge, to followup as an outpatient.   Labs:   Lab Results  Component Value Date   WBC 11.2* 11/05/2011   HGB 10.6* 11/05/2011   HCT 32.3* 11/05/2011   MCV 103.2* 11/05/2011   PLT 212 11/05/2011    Lab 11/05/11 0411  NA 138  K 4.0  CL 108  CO2 21  BUN 12  CREATININE 0.90  CALCIUM 9.6  PROT --  BILITOT --  ALKPHOS --  ALT --  AST --  GLUCOSE 134*   Lab Results  Component Value Date   CKTOTAL 28 11/04/2011   CKMB 1.9 11/04/2011   TROPONINI <0.30 11/04/2011    Lab Results  Component Value Date   CHOL 144 11/04/2011   CHOL 120 10/13/2011   Lab Results  Component Value Date   HDL 38* 11/04/2011   HDL 30* 10/13/2011   Lab Results  Component Value Date   LDLCALC 80 11/04/2011   LDLCALC 61 10/13/2011   Lab Results  Component Value Date   TRIG 129 11/04/2011   TRIG 146 10/13/2011   Lab Results  Component Value Date   CHOLHDL 3.8 11/04/2011   CHOLHDL 4.0 10/13/2011   No results found  for this basename: LDLDIRECT      Radiology: CHEST - 2 VIEW  Comparison: Chest radiograph 10/12/2011  Findings: Normal cardiac silhouette. Lungs are hyperinflated. Calcified granuloma in the left lung. There is small left effusion. Lungs are hyperinflated.  IMPRESSION:  1. Small left effusion. 2. Hyperinflated lungs.   FOLLOW UP PLANS AND APPOINTMENTS Discharge Orders    Future Appointments: Provider: Department: Dept Phone: Center:   11/15/2011 8:50 AM [facility] [phone] [facility]   11/26/2011 9:00 AM [facility] [phone] [facility]   11/26/2011 9:30 AM Pryor Ochoa, MD  [facility] [phone] [facility]   12/13/2011 9:00 AM Luis Abed, MD [facility] [phone] [facility]     Current Discharge Medication List    CONTINUE these medications which have NOT CHANGED   Details  ALPRAZolam (XANAX) 0.5 MG tablet Take 0.5-1 mg by mouth 2 (two) times daily. Takes 1 tab in the am & 2 tabs at night    aspirin 325 MG tablet Take 325 mg by mouth daily.      buPROPion (WELLBUTRIN XL) 300 MG 24 hr tablet Take 300 mg by mouth every morning.      Cholecalciferol (VITAMIN D3) 5000 UNITS CAPS Take 1 capsule by mouth daily.      furosemide (LASIX) 20 MG tablet Take 20 mg by mouth every morning.      guaiFENesin (MUCINEX) 600 MG 12 hr tablet Take 1 tablet (600 mg total) by mouth 2 (two) times daily. Qty: 30 tablet, Refills: 0    HYDROcodone-acetaminophen (NORCO) 5-325 MG per tablet Take 1 tablet by mouth every 8 (eight) hours as needed for pain. Qty: 30 tablet, Refills: 0    levothyroxine (SYNTHROID, LEVOTHROID) 150 MCG tablet Take 150 mcg by mouth every morning.      lisinopril (PRINIVIL,ZESTRIL) 5 MG tablet Take 1 tablet (5 mg total) by mouth daily. Qty: 30 tablet, Refills: 0      STOP taking these medications     benzonatate (TESSALON PERLES) 100 MG capsule      moxifloxacin (AVELOX) 400 MG tablet        Follow-up Information    Follow up with [facility], MD .   Follow up with Dr Myrtis Ser - Keep appt.      BRING ALL MEDICATIONS WITH YOU TO FOLLOW UP APPOINTMENTS  Time spent with patient to include physician time: Signed: Theodore Demark 11/05/2011, 8:02 AM Co-Sign MD

## 2011-11-07 ENCOUNTER — Encounter (HOSPITAL_COMMUNITY): Payer: Self-pay | Admitting: *Deleted

## 2011-11-07 ENCOUNTER — Emergency Department (HOSPITAL_COMMUNITY)
Admission: EM | Admit: 2011-11-07 | Discharge: 2011-11-07 | Disposition: A | Discharge status: Home / Self Care | Payer: Medicare Other | Attending: Emergency Medicine | Admitting: Emergency Medicine

## 2011-11-07 ENCOUNTER — Other Ambulatory Visit: Payer: Self-pay

## 2011-11-07 DIAGNOSIS — I252 Old myocardial infarction: Secondary | ICD-10-CM | POA: Insufficient documentation

## 2011-11-07 DIAGNOSIS — R42 Dizziness and giddiness: Secondary | ICD-10-CM | POA: Insufficient documentation

## 2011-11-07 DIAGNOSIS — Z7982 Long term (current) use of aspirin: Secondary | ICD-10-CM | POA: Insufficient documentation

## 2011-11-07 DIAGNOSIS — Z79899 Other long term (current) drug therapy: Secondary | ICD-10-CM | POA: Insufficient documentation

## 2011-11-07 DIAGNOSIS — J449 Chronic obstructive pulmonary disease, unspecified: Secondary | ICD-10-CM | POA: Insufficient documentation

## 2011-11-07 DIAGNOSIS — E039 Hypothyroidism, unspecified: Secondary | ICD-10-CM | POA: Insufficient documentation

## 2011-11-07 DIAGNOSIS — I10 Essential (primary) hypertension: Secondary | ICD-10-CM

## 2011-11-07 DIAGNOSIS — R070 Pain in throat: Secondary | ICD-10-CM | POA: Insufficient documentation

## 2011-11-07 DIAGNOSIS — E0590 Thyrotoxicosis, unspecified without thyrotoxic crisis or storm: Secondary | ICD-10-CM | POA: Insufficient documentation

## 2011-11-07 DIAGNOSIS — I509 Heart failure, unspecified: Secondary | ICD-10-CM | POA: Insufficient documentation

## 2011-11-07 DIAGNOSIS — J4489 Other specified chronic obstructive pulmonary disease: Secondary | ICD-10-CM | POA: Insufficient documentation

## 2011-11-07 DIAGNOSIS — G40909 Epilepsy, unspecified, not intractable, without status epilepticus: Secondary | ICD-10-CM | POA: Insufficient documentation

## 2011-11-07 DIAGNOSIS — M129 Arthropathy, unspecified: Secondary | ICD-10-CM | POA: Insufficient documentation

## 2011-11-07 DIAGNOSIS — E119 Type 2 diabetes mellitus without complications: Secondary | ICD-10-CM | POA: Insufficient documentation

## 2011-11-07 DIAGNOSIS — F341 Dysthymic disorder: Secondary | ICD-10-CM | POA: Insufficient documentation

## 2011-11-07 DIAGNOSIS — K219 Gastro-esophageal reflux disease without esophagitis: Secondary | ICD-10-CM | POA: Insufficient documentation

## 2011-11-07 DIAGNOSIS — R110 Nausea: Secondary | ICD-10-CM | POA: Insufficient documentation

## 2011-11-07 NOTE — ED Provider Notes (Signed)
History     CSN: [id number]  Arrival date & time 11/07/11  1001   First MD Initiated Contact with Patient 11/07/11 1104      Chief Complaint  Patient presents with  . Nausea  . Dizziness    (Consider location/radiation/quality/duration/timing/severity/associated sxs/prior treatment) HPI Comments: Pt reports this AM she felt "sick" can not describe what she means to me by this.  When I directly ask, she denies HA, no CP, no abd pain, has nasuea sometimes, feels flushed sometimes.  Spouse took her BP and it was elevated at 200/100.  EMS called and brought here.  She did have an apt to see PA with [facility], but did not go to see them today.  She has been in and out of hospital recently due to arrythmia.  She has a known aneurysm, but denies abd pain, flank pain, back pain this AM or now.  She is scheduled to have another scan of it in January and has been seeing a vascular surgeon with plans of surgery at some point, although pt and family are not sure about specifics.  She feels improved from this AM now.  No CP, palpitations, feeling faint or syncope.  She reports she would like to go home at this point without further testing.    The history is provided by the patient and the spouse.    Past Medical History  Diagnosis Date  . Diabetes mellitus   . Thyroid disease   . Abdominal aneurysm   . COPD (chronic obstructive pulmonary disease)   . Hypertension   . Shortness of breath   . Hypothyroidism   . Depression   . Recurrent upper respiratory infection (URI)   . Seizures   . Ejection fraction     NL EF 10/2011 echo  . Abnormal EKG     November, 2012  . Contrast media allergy     Patient feels poorly with contrast  . AAA (abdominal aortic aneurysm)     10/2011 - 5.4cm - scheduled to f/u Dr. Hart [place]  . Pneumonia     10/2011  . Pre-syncope     vasovagal w/ heart block  . Parotid mass     has refused further eval 10/2011  . Chest pain at rest     Myoview 10/14/2011:  ef 60%, ? dist anteroseptal scar w/ sublte peri-infarct ischemia vs. attenuation  . Hyperthyroidism   . CHF (congestive heart failure)   . GERD (gastroesophageal reflux disease)   . Headache   . Arthritis   . Anxiety   . Dysrhythmia   . Myocardial infarction     Past Surgical History  Procedure Date  . Cholecystectomy   . Joint replacement   . Sinus surgery with instatrak   . Appendectomy   . Oophorectomy   . Arthroscopic repair acl   . Cardiac catheterization   . Eye surgery     Family History  Problem Relation Age of Onset  . Cancer Father     History  Substance Use Topics  . Smoking status: Former Smoker    Quit date: 08/31/2011  . Smokeless tobacco: Not on file  . Alcohol Use: No    OB History    Grav Para Term Preterm Abortions TAB SAB Ect Mult Living                  Review of Systems  Constitutional: Negative for fever, chills, diaphoresis, activity change, appetite change and fatigue.  HENT: Positive for sore  throat. Negative for congestion and rhinorrhea.   Eyes: Negative for discharge.  Respiratory: Negative for cough, chest tightness and shortness of breath.   Cardiovascular: Negative for chest pain, palpitations and leg swelling.  Gastrointestinal: Positive for nausea. Negative for vomiting, abdominal pain and diarrhea.  Genitourinary: Negative for flank pain.  Musculoskeletal: Negative for back pain.  Neurological: Positive for dizziness.  Psychiatric/Behavioral: Negative for confusion.    Allergies  Contrast media; Iohexol; Penicillins; and Sulfa antibiotics  Home Medications   Current Outpatient Rx  Name Route Sig Dispense Refill  . ALPRAZOLAM 0.5 MG PO TABS Oral Take 0.5-1 mg by mouth 2 (two) times daily. Takes 1 tab in the am & 2 tabs at night    . ASPIRIN 325 MG PO TABS Oral Take 325 mg by mouth daily.      . BUPROPION HCL ER (XL) 300 MG PO TB24 Oral Take 300 mg by mouth every morning.      Marland Kitchen VITAMIN D3 5000 UNITS PO CAPS Oral Take 1  capsule by mouth daily.      . FUROSEMIDE 20 MG PO TABS Oral Take 20 mg by mouth every morning.      . GUAIFENESIN ER 600 MG PO TB12 Oral Take 1 tablet (600 mg total) by mouth 2 (two) times daily. 30 tablet 0  . HYDROCODONE-ACETAMINOPHEN 5-325 MG PO TABS Oral Take 1 tablet by mouth every 8 (eight) hours as needed for pain. 30 tablet 0  . LEVOTHYROXINE SODIUM 150 MCG PO TABS Oral Take 150 mcg by mouth every morning.      Marland Kitchen LISINOPRIL 5 MG PO TABS Oral Take 1 tablet (5 mg total) by mouth daily. 30 tablet 0    BP 146/74  Pulse 95  Temp(Src) 99.1 F (37.3 C) (Oral)  Resp 22  SpO2 95%  Physical Exam  Nursing note and vitals reviewed. Constitutional: She is oriented to person, place, and time. She appears well-developed and well-nourished.  HENT:  Head: Normocephalic and atraumatic.  Eyes: Conjunctivae are normal. Pupils are equal, round, and reactive to light. No scleral icterus.  Neck: Neck supple. No thyromegaly present.  Pulmonary/Chest: Effort normal. She has no wheezes.  Abdominal: Soft.  Musculoskeletal: She exhibits no tenderness.  Neurological: She is alert and oriented to person, place, and time. No cranial nerve deficit. Coordination normal.  Skin: Skin is warm and dry. No rash noted.  Psychiatric: She has a normal mood and affect.    ED Course  Procedures (including critical care time)  Labs Reviewed - No data to display No results found.   1. Hypertension       MDM   ECG at time 10:09 shows NSR at rate 93, PR is marginally prolonged at 208 ms.  Normal axis.  Inverted t waves anteriorly, seen previously on 11/05/11.  Pt has no CP, no current SOB, nausea.  3 BP's on monitor show a BP range of 140-155/80, thus no sig HTN now.  I doubt crisis.  No abd pain or tenderness now, no symptoms of sudden change of aortic aneurysm.  Pt again voices to me adn spouse several times that she would like to go home.  She is also supposed to have a Holter monitor placed by Dr. Myrtis Ser  soon.  I have offered to call him and discuss when this was going to happen, but pt would rather go home and just wait for [facility] cardiology to call them with appt.  She is told taht BP is ok, HR  is ok, RA sat is 95% which is normal.  She is not in resp distress.  Will allow her to be discharged and told to return if any further concerns, but to follow up with cardiology and her PCP this week or early next week.  I reviewed prior records.          Gavin Pound. Calvina Liptak, MD 11/07/11 1201

## 2011-11-07 NOTE — Discharge Instructions (Signed)
Arterial Hypertension °Arterial hypertension (high blood pressure) is a condition of elevated pressure in your blood vessels. Hypertension over a long period of time is a risk factor for strokes, heart attacks, and heart failure. It is also the leading cause of kidney (renal) failure.  °CAUSES  °· In Adults -- Over 90% of all hypertension has no known cause. This is called essential or primary hypertension. In the other 10% of people with hypertension, the increase in blood pressure is caused by another disorder. This is called secondary hypertension. Important causes of secondary hypertension are:  °· Heavy alcohol use.  °· Obstructive sleep apnea.  °· Hyperaldosterosim (Conn's syndrome).  °· Steroid use.  °· Chronic kidney failure.  °· Hyperparathyroidism.  °· Medications.  °· Renal artery stenosis.  °· Pheochromocytoma.  °· Cushing's disease.  °· Coarctation of the aorta.  °· Scleroderma renal crisis.  °· Licorice (in excessive amounts).  °· Drugs (cocaine, methamphetamine).  °Your caregiver can explain any items above that apply to you. °· In Children -- Secondary hypertension is more common and should always be considered.  °· Pregnancy -- Few women of childbearing age have high blood pressure. However, up to 10% of them develop hypertension of pregnancy. Generally, this will not harm the woman. It may be a sign of 3 complications of pregnancy: preeclampsia, HELLP syndrome, and eclampsia. Follow up and control with medication is necessary.  °SYMPTOMS  °· This condition normally does not produce any noticeable symptoms. It is usually found during a routine exam.  °· Malignant hypertension is a late problem of high blood pressure. It may have the following symptoms:  °· Headaches.  °· Blurred vision.  °· End-organ damage (this means your kidneys, heart, lungs, and other organs are being damaged).  °· Stressful situations can increase the blood pressure. If a person with normal blood pressure has their blood  pressure go up while being seen by their caregiver, this is often termed "white coat hypertension." Its importance is not known. It may be related with eventually developing hypertension or complications of hypertension.  °· Hypertension is often confused with mental tension, stress, and anxiety.  °DIAGNOSIS  °The diagnosis is made by 3 separate blood pressure measurements. They are taken at least 1 week apart from each other. If there is organ damage from hypertension, the diagnosis may be made without repeat measurements. °Hypertension is usually identified by having blood pressure readings: °· Above 140/90 mmHg measured in both arms, at 3 separate times, over a couple weeks.  °· Over 130/80 mmHg should be considered a risk factor and may require treatment in patients with diabetes.  °Blood pressure readings over 120/80 mmHg are called "pre-hypertension" even in non-diabetic patients. °To get a true blood pressure measurement, use the following guidelines. Be aware of the factors that can alter blood pressure readings. °· Take measurements at least 1 hour after caffeine.  °· Take measurements 30 minutes after smoking and without any stress. This is another reason to quit smoking - it raises your blood pressure.  °· Use a proper cuff size. Ask your caregiver if you are not sure about your cuff size.  °· Most home blood pressure cuffs are automatic. They will measure systolic and diastolic pressures. The systolic pressure is the pressure reading at the start of sounds. Diastolic pressure is the pressure at which the sounds disappear. If you are elderly, measure pressures in multiple postures. Try sitting, lying or standing.  °· Sit at rest for a minimum of   5 minutes before taking measurements.  °· You should not be on any medications like decongestants. These are found in many cold medications.  °· Record your blood pressure readings and review them with your caregiver.  °If you have hypertension: °· Your caregiver  may do tests to be sure you do not have secondary hypertension (see "causes" above).  °· Your caregiver may also look for signs of metabolic syndrome. This is also called Syndrome X or Insulin Resistance Syndrome. You may have this syndrome if you have type 2 diabetes, abdominal obesity, and abnormal blood lipids in addition to hypertension.  °· Your caregiver will take your medical and family history and perform a physical exam.  °· Diagnostic tests may include blood tests (for glucose, cholesterol, potassium, and kidney function), a urinalysis, or an EKG. Other tests may also be necessary depending on your condition.  °PREVENTION  °There are important lifestyle issues that you can adopt to reduce your chance of developing hypertension: °· Maintain a normal weight.  °· Limit the amount of salt (sodium) in your diet.  °· Exercise often.  °· Limit alcohol intake.  °· Get enough potassium in your diet. Discuss specific advice with your caregiver.  °· Follow a DASH diet (dietary approaches to stop hypertension). This diet is rich in fruits, vegetables, and low-fat dairy products, and avoids certain fats.  °PROGNOSIS  °Essential hypertension cannot be cured. Lifestyle changes and medical treatment can lower blood pressure and reduce complications. The prognosis of secondary hypertension depends on the underlying cause. Many people whose hypertension is controlled with medicine or lifestyle changes can live a normal, healthy life.  °RISKS AND COMPLICATIONS  °While high blood pressure alone is not an illness, it often requires treatment due to its short- and long-term effects on many organs. Hypertension increases your risk for: °· CVAs or strokes (cerebrovascular accident).  °· Heart failure due to chronically high blood pressure (hypertensive cardiomyopathy).  °· Heart attack (myocardial infarction).  °· Damage to the retina (hypertensive retinopathy).  °· Kidney failure (hypertensive nephropathy).  °Your caregiver can  explain list items above that apply to you. Treatment of hypertension can significantly reduce the risk of complications. °TREATMENT  °· For overweight patients, weight loss and regular exercise are recommended. Physical fitness lowers blood pressure.  °· Mild hypertension is usually treated with diet and exercise. A diet rich in fruits and vegetables, fat-free dairy products, and foods low in fat and salt (sodium) can help lower blood pressure. Decreasing salt intake decreases blood pressure in a 1/3 of people.  °· Stop smoking if you are a smoker.  °The steps above are highly effective in reducing blood pressure. While these actions are easy to suggest, they are difficult to achieve. Most patients with moderate or severe hypertension end up requiring medications to bring their blood pressure down to a normal level. There are several classes of medications for treatment. Blood pressure pills (antihypertensives) will lower blood pressure by their different actions. Lowering the blood pressure by 10 mmHg may decrease the risk of complications by as much as 25%. °The goal of treatment is effective blood pressure control. This will reduce your risk for complications. Your caregiver will help you determine the best treatment for you according to your lifestyle. What is excellent treatment for one person, may not be for you. °HOME CARE INSTRUCTIONS  °· Do not smoke.  °· Follow the lifestyle changes outlined in the "Prevention" section.  °· If you are on medications, follow the directions   carefully. Blood pressure medications must be taken as prescribed. Skipping doses reduces their benefit. It also puts you at risk for problems.  °· Follow up with your caregiver, as directed.  °· If you are asked to monitor your blood pressure at home, follow the guidelines in the "Diagnosis" section above.  °SEEK MEDICAL CARE IF:  °· You think you are having medication side effects.  °· You have recurrent headaches or lightheadedness.    °· You have swelling in your ankles.  °· You have trouble with your vision.  °SEEK IMMEDIATE MEDICAL CARE IF:  °· You have sudden onset of chest pain or pressure, difficulty breathing, or other symptoms of a heart attack.  °· You have a severe headache.  °· You have symptoms of a stroke (such as sudden weakness, difficulty speaking, difficulty walking).  °MAKE SURE YOU:  °· Understand these instructions.  °· Will watch your condition.  °· Will get help right away if you are not doing well or get worse.  °Document Released: 10/28/2005 Document Revised: 07/10/2011 Document Reviewed: 05/28/2007 °[employer]® Patient Information ©2012 [employer]. °

## 2011-11-07 NOTE — ED Notes (Signed)
Pt undressed and placed in gown. Pt is on cardiac monitor, bp cuff, and pulse ox.  

## 2011-11-07 NOTE — ED Notes (Signed)
Per EMS pt from home with c/o nausea and dizziness. Pt seen 8 times in last month for same. Recent dx of CHF, pt taking lasix. Denies pain. No syncope, no vomiting, no fever. BP 160/108 HR 90. No prescriptions received from previous visits for nausea or dizziness. Pt refused to have IV started by EMS.

## 2011-11-07 NOTE — ED Notes (Signed)
Pt states she is not improving. Pt d/c'ed on 12/25 from hospital, states continues to feel dizzy with nausea. Pt did not receive any rx's for nausea/dizziness medications. States has been going on for 3 months. Pt made an appt to see PCP, but felt too bad and husband called EMS. Pt denies pain. Reports feeling left side of neck is swollen. No difficulty breathing/resp distress noted.

## 2011-11-12 ENCOUNTER — Telehealth: Payer: Self-pay | Admitting: Nurse Practitioner

## 2011-11-12 NOTE — Telephone Encounter (Signed)
pts husband stated that pt cont to have facial twitching that has been present for > 1 month.  Today, she also feels as though the left side of her face is numb.  Husband feels this may be r/t her heart.  I advised that in light of her normal coronary arteries, found just last week, that her heart appears to be in good shape, however, if she has new facial numbness, there is reasonable concern for stroke, but that pt would have to present to local ED for evaluation.  He asked if there was anything I could prescribe over the phone for the twitching/numbness.  I advised that there was not but that she should be eval at her local ED.  He asked if they could wait until her scheduled appt with neurology (Dr. Richardean Chimera - for facial twitching) on Friday to be seen, but again, i rec that if she has had an acute change in her Ss, that she should be seen today in the ER.  pts husband said he will see if she gets any worse.

## 2011-11-14 ENCOUNTER — Telehealth: Payer: Self-pay | Admitting: Physician Assistant

## 2011-11-14 NOTE — Telephone Encounter (Signed)
Mr. Paula [name] called because his wife had developed a small knot at her cath site and he was concerned about this. The knot is approximately 1 cm in diameter. They did not notice it until today but since they noticed it, it has not increased in size. It is not sore. She is not having any numbness or significant pain in her leg. She is having no problems with ambulation. She states that she has taken it easy as instructed and has not been straining herself. She is compliant with all medications. There have been no fevers or chills and no signs or symptoms of bleeding.  I advised them that as long as the knot did not increase in size, it did not need to be evaluated overnight. I advised that our office will give them a call tomorrow to let them know when she could be seen. I advised that if she had any further problems or if she developed any pain in her leg, or signs of infection, she should call us back or come to the emergency room. [name] felt like she was doing okay otherwise and stated that she would call back if needed.

## 2011-11-15 ENCOUNTER — Telehealth: Payer: Self-pay | Admitting: Cardiology

## 2011-11-15 ENCOUNTER — Ambulatory Visit
Admission: RE | Admit: 2011-11-15 | Discharge: 2011-11-15 | Disposition: A | Discharge status: Home / Self Care | Payer: Medicare Other | Source: Ambulatory Visit | Attending: Internal Medicine | Admitting: Internal Medicine

## 2011-11-15 ENCOUNTER — Other Ambulatory Visit: Payer: Self-pay

## 2011-11-15 DIAGNOSIS — Z1231 Encounter for screening mammogram for malignant neoplasm of breast: Secondary | ICD-10-CM

## 2011-11-15 MED ORDER — FUROSEMIDE 20 MG PO TABS: 20.0000 mg | ORAL_TABLET | ORAL | Status: DC

## 2011-11-15 MED ORDER — LISINOPRIL 5 MG PO TABS: 5.0000 mg | ORAL_TABLET | Freq: Every day | ORAL | Status: DC

## 2011-11-15 NOTE — Telephone Encounter (Signed)
New Msg: Pt after hours vm: pt has a knot in groin area one week post CATH. Please call pt to discuss and determine it pt needs to be seen/evaluated.

## 2011-11-15 NOTE — Telephone Encounter (Signed)
N/A.  LMTC. 

## 2011-11-15 NOTE — Telephone Encounter (Signed)
Fu msg Pt returning your call 

## 2011-11-15 NOTE — Telephone Encounter (Signed)
Pt states she received a call last night from one of the PA's on call that answered her question.

## 2011-11-18 NOTE — Telephone Encounter (Signed)
Followed up with pt this a.m.  She states that the knot is not getting bigger and is not as noticeable as before.  States she is doing fine.

## 2011-11-19 ENCOUNTER — Telehealth: Payer: Self-pay | Admitting: Physician Assistant

## 2011-11-19 NOTE — Telephone Encounter (Signed)
Patient had some trouble with dizziness this afternoon.  BP stable.  No blurred vision, n/v or syncope.  Dizziness improved by time of return call.  There was a question about possible holter monitor for pauses per the patient's husband.  Told them I would forward this message to Dr. Myrtis Ser for further evaluation.  If dizziness or syncope occur they are instructed to go to the ER.  They voiced understanding and appreciated the call back.  Please call the patient tomorrow and reassess.

## 2011-11-25 ENCOUNTER — Encounter: Payer: Self-pay | Admitting: Vascular Surgery

## 2011-11-26 ENCOUNTER — Encounter (HOSPITAL_COMMUNITY): Payer: Self-pay | Admitting: Emergency Medicine

## 2011-11-26 ENCOUNTER — Encounter (INDEPENDENT_AMBULATORY_CARE_PROVIDER_SITE_OTHER): Payer: Medicare Other | Admitting: *Deleted

## 2011-11-26 ENCOUNTER — Encounter: Payer: Self-pay | Admitting: Vascular Surgery

## 2011-11-26 ENCOUNTER — Ambulatory Visit (INDEPENDENT_AMBULATORY_CARE_PROVIDER_SITE_OTHER): Payer: Medicare Other | Admitting: Vascular Surgery

## 2011-11-26 ENCOUNTER — Emergency Department (HOSPITAL_COMMUNITY)
Admission: EM | Admit: 2011-11-26 | Discharge: 2011-11-26 | Disposition: A | Discharge status: Home / Self Care | Payer: Medicare Other | Attending: Emergency Medicine | Admitting: Emergency Medicine

## 2011-11-26 VITALS — BP 135/65 | HR 83 | Resp 20 | Ht 65.0 in | Wt 157.0 lb

## 2011-11-26 DIAGNOSIS — K219 Gastro-esophageal reflux disease without esophagitis: Secondary | ICD-10-CM | POA: Insufficient documentation

## 2011-11-26 DIAGNOSIS — R259 Unspecified abnormal involuntary movements: Secondary | ICD-10-CM | POA: Insufficient documentation

## 2011-11-26 DIAGNOSIS — G518 Other disorders of facial nerve: Secondary | ICD-10-CM | POA: Insufficient documentation

## 2011-11-26 DIAGNOSIS — E119 Type 2 diabetes mellitus without complications: Secondary | ICD-10-CM | POA: Insufficient documentation

## 2011-11-26 DIAGNOSIS — Z7982 Long term (current) use of aspirin: Secondary | ICD-10-CM | POA: Insufficient documentation

## 2011-11-26 DIAGNOSIS — G40909 Epilepsy, unspecified, not intractable, without status epilepticus: Secondary | ICD-10-CM | POA: Insufficient documentation

## 2011-11-26 DIAGNOSIS — Z79899 Other long term (current) drug therapy: Secondary | ICD-10-CM | POA: Insufficient documentation

## 2011-11-26 DIAGNOSIS — J4489 Other specified chronic obstructive pulmonary disease: Secondary | ICD-10-CM | POA: Insufficient documentation

## 2011-11-26 DIAGNOSIS — E0590 Thyrotoxicosis, unspecified without thyrotoxic crisis or storm: Secondary | ICD-10-CM | POA: Insufficient documentation

## 2011-11-26 DIAGNOSIS — M129 Arthropathy, unspecified: Secondary | ICD-10-CM | POA: Insufficient documentation

## 2011-11-26 DIAGNOSIS — F341 Dysthymic disorder: Secondary | ICD-10-CM | POA: Insufficient documentation

## 2011-11-26 DIAGNOSIS — G514 Facial myokymia: Secondary | ICD-10-CM

## 2011-11-26 DIAGNOSIS — I714 Abdominal aortic aneurysm, without rupture: Secondary | ICD-10-CM

## 2011-11-26 DIAGNOSIS — J449 Chronic obstructive pulmonary disease, unspecified: Secondary | ICD-10-CM | POA: Insufficient documentation

## 2011-11-26 DIAGNOSIS — I10 Essential (primary) hypertension: Secondary | ICD-10-CM | POA: Insufficient documentation

## 2011-11-26 DIAGNOSIS — E039 Hypothyroidism, unspecified: Secondary | ICD-10-CM | POA: Insufficient documentation

## 2011-11-26 DIAGNOSIS — I252 Old myocardial infarction: Secondary | ICD-10-CM | POA: Insufficient documentation

## 2011-11-26 DIAGNOSIS — I509 Heart failure, unspecified: Secondary | ICD-10-CM | POA: Insufficient documentation

## 2011-11-26 MED ORDER — CARBAMAZEPINE 100 MG PO CHEW
200.0000 mg | CHEWABLE_TABLET | Freq: Once | ORAL | Status: AC
  Administered 2011-11-26: 100 mg via ORAL
  Filled 2011-11-26: qty 2

## 2011-11-26 MED ORDER — LORAZEPAM 1 MG PO TABS
1.0000 mg | ORAL_TABLET | Freq: Once | ORAL | Status: AC
  Administered 2011-11-26: 1 mg via ORAL
  Filled 2011-11-26: qty 1

## 2011-11-26 MED ORDER — CARBAMAZEPINE 200 MG PO TABS
200.0000 mg | ORAL_TABLET | Freq: Once | ORAL | Status: DC
  Filled 2011-11-26: qty 1

## 2011-11-26 NOTE — ED Notes (Signed)
Pt wants to leave before any work up is done on her. Pt anxious. Appears frustrated. Charge Nurse, Victorino Dike and MD at bedside speaking with pt. Pt medicated per order.

## 2011-11-26 NOTE — ED Notes (Signed)
OZH:[id number]<HQ> Expected date:11/26/11<BR> Expected time: 8:00 PM<BR> Means of arrival:Ambulance<BR> Comments:<BR> abd pain, 78 yrs old

## 2011-11-26 NOTE — ED Notes (Signed)
MD at bedside. 

## 2011-11-26 NOTE — Progress Notes (Signed)
Addended by: Sharee Pimple on: 11/26/2011 10:18 AM   Modules accepted: Orders

## 2011-11-26 NOTE — ED Notes (Signed)
Pt has twitches to both sides of face. "I'm scared" states pt. She states this is why she called to ambulance. Pt states she has seen a neurologist for this and is taking medications. Pt c/o pain to L side of face and down L side of neck since 1500. Pt anxious. Husband at bedside.

## 2011-11-26 NOTE — ED Notes (Signed)
"  Sick" today since 1500. Periods of diaphoresis. Has had weakness today. Denies chest pain or SOB. Pt a/o x 3. Pt lives at home with husband.

## 2011-11-26 NOTE — Progress Notes (Signed)
Subjective:     Patient ID: Paula [name], female   DOB: February 07, 1927, 78 y.o.   MRN: [id number]  HPI this 78 year old female returns today for followup regarding her abdominal aortic aneurysm. I been following this for about 9 years. Most recent ultrasound was in June of 2012 and the maximum diameter was 4.8 cm. Repeat duplex scan of her abdominal aorta was performed today which I reviewed and interpreted. Maximum diameter has grown to 5.5 cm in the past 7 months. Patient denies any new abdominal or back symptoms. She has had multiple trips to the emergency department over the past few months and had a temporary pacemaker but it was determined she did not need a permanent pacemaker. She had a cardiac catheterization which apparently revealed no severe coronary artery disease. She'll seeing Dr. Jerral Bonito on Friday, February 1.  Past Medical History  Diagnosis Date  . Diabetes mellitus   . Thyroid disease   . Abdominal aneurysm   . COPD (chronic obstructive pulmonary disease)   . Hypertension   . Shortness of breath   . Hypothyroidism   . Depression   . Recurrent upper respiratory infection (URI)   . Seizures   . Ejection fraction     NL EF 10/2011 echo  . Abnormal EKG     November, 2012  . Contrast media allergy     Patient feels poorly with contrast  . AAA (abdominal aortic aneurysm)     10/2011 - 5.4cm - scheduled to f/u Dr. Hart Rochester  . Pneumonia     10/2011  . Pre-syncope     vasovagal w/ heart block  . Parotid mass     has refused further eval 10/2011  . Chest pain at rest     Myoview 10/14/2011: ef 60%, ? dist anteroseptal scar w/ sublte peri-infarct ischemia vs. attenuation  . Hyperthyroidism   . CHF (congestive heart failure)   . GERD (gastroesophageal reflux disease)   . Headache   . Arthritis   . Anxiety   . Dysrhythmia   . Myocardial infarction     History  Substance Use Topics  . Smoking status: Former Smoker -- 1.0 packs/day for 65 years    Types: Cigarettes   Quit date: 08/31/2011  . Smokeless tobacco: Never Used  . Alcohol Use: No    Family History  Problem Relation Age of Onset  . Cancer Father     Allergies  Allergen Reactions  . Contrast Media (Iodinated Diagnostic Agents) Anaphylaxis  . Iohexol      Desc: PT STATES SHE HAD A SEVERE REACTION TO IV CONRAST WITH THROAT SWELLING AND SOB. SHE WAS ADMITTED TO THE HOSPITAL. SHE HAS NEVER HAD CONTRAST AGAIN.   Marland Kitchen Penicillins Rash  . Sulfa Antibiotics Anaphylaxis    Current outpatient prescriptions:ALPRAZolam (XANAX) 0.5 MG tablet, Take 0.5-1 mg by mouth 2 (two) times daily. Takes 1 tab in the am & 2 tabs at night, Disp: , Rfl: ;  aspirin 325 MG tablet, Take 325 mg by mouth daily.  , Disp: , Rfl: ;  buPROPion (WELLBUTRIN XL) 300 MG 24 hr tablet, Take 300 mg by mouth every morning.  , Disp: , Rfl: ;  Cholecalciferol (VITAMIN D3) 5000 UNITS CAPS, Take 1 capsule by mouth daily.  , Disp: , Rfl:  furosemide (LASIX) 20 MG tablet, Take 1 tablet (20 mg total) by mouth every morning., Disp: 30 tablet, Rfl: 11;  guaiFENesin (MUCINEX) 600 MG 12 hr tablet, Take 1 tablet (600 mg total) by  mouth 2 (two) times daily., Disp: 30 tablet, Rfl: 0;  levothyroxine (SYNTHROID, LEVOTHROID) 150 MCG tablet, Take 150 mcg by mouth every morning.  , Disp: , Rfl:  lisinopril (PRINIVIL,ZESTRIL) 5 MG tablet, Take 1 tablet (5 mg total) by mouth daily., Disp: 30 tablet, Rfl: 0  BP 135/65  Pulse 83  Resp 20  Ht 5\' 5"  (1.651 m)  Wt 157 lb (71.215 kg)  BMI 26.13 kg/m2  Body mass index is 26.13 kg/(m^2).         Review of Systems denies chest pain or dyspnea on exertion. Does have difficulty swallowing and occasional dizziness. Is a 60 year history of heavy smoking which has been discontinued currently. Denies lower extremity claudication or hemoptysis. All other systems negative.     Objective:   Physical Exam blood pressure 130/65 heart rate 83 respirations 20 HEENT normal for age Lungs no rhonchi or  wheezing Cardiovascular regular rhythm no murmurs carotid pulses 3+ no audible bruits Abdomen 560 m pulsatile mass in the mid epigastrium which is nontender Neurologic normal Musculoskeletal free major deformities Lower extremity 3+ femoral and dorsalis pedis pulses palpable bilaterally  Today I reviewed and interpreted the duplex scan in our office which reveals aneurysm to be 5.5 cm in maximum diameter     Assessment:     5.5 cm infrarenal abdominal aortic aneurysm which has enlarged by nearly 1 cm in past 7 months Needs treatment     Plan:    plan CT angiogram of abdomen and pelvis in the next few weeks-patient has remote history of contrast allergy. She did have cardiac catheterization and was pretreated with likely steroids and Benadryl with no problems. Will need pretreatment prior to CT scan with contrast Patient see Dr. Myrtis Ser on Friday, February 1 Return to see me on Tuesday, February 5

## 2011-11-28 NOTE — ED Provider Notes (Addendum)
History     CSN: [id number]  Arrival date & time 11/26/11  2017   First MD Initiated Contact with Patient 11/26/11 2037      Chief Complaint  Patient presents with  . Weakness   HPI: The history is provided by the patient.  Pt reports facial twitching. States she has had these symptoms for several months. States she was diagnosed w/ a "condition" (can not remember the name) by Dr Vickey Huger and placed on Tegretol for same. States twitching became worse than usual this evening and when it does it gets scary because her face and throat feel like they are "drawing" to one side. Dr Dohmeier has instructed pt that she could increase her Tegretol when symptoms worsen which she did x 1 prior to arrival. Admits symptoms are somewhat better. Pt now states she wants to go home so she can take another Tegretol which would still be within the dosing range suggested by Dr Vickey Huger. Pt very anxious.   Past Medical History  Diagnosis Date  . Diabetes mellitus   . Thyroid disease   . Abdominal aneurysm   . COPD (chronic obstructive pulmonary disease)   . Hypertension   . Shortness of breath   . Hypothyroidism   . Depression   . Recurrent upper respiratory infection (URI)   . Seizures   . Ejection fraction     NL EF 10/2011 echo  . Abnormal EKG     November, 2012  . Contrast media allergy     Patient feels poorly with contrast  . AAA (abdominal aortic aneurysm)     10/2011 - 5.4cm - scheduled to f/u Dr. Hart Rochester  . Pneumonia     10/2011  . Pre-syncope     vasovagal w/ heart block  . Parotid mass     has refused further eval 10/2011  . Chest pain at rest     Myoview 10/14/2011: ef 60%, ? dist anteroseptal scar w/ sublte peri-infarct ischemia vs. attenuation  . Hyperthyroidism   . CHF (congestive heart failure)   . GERD (gastroesophageal reflux disease)   . Headache   . Arthritis   . Anxiety   . Dysrhythmia   . Myocardial infarction     Past Surgical History  Procedure Date  .  Cholecystectomy   . Joint replacement   . Sinus surgery with instatrak   . Appendectomy   . Oophorectomy   . Arthroscopic repair acl   . Cardiac catheterization   . Eye surgery     Family History  Problem Relation Age of Onset  . Cancer Father     History  Substance Use Topics  . Smoking status: Former Smoker -- 1.0 packs/day for 65 years    Types: Cigarettes    Quit date: 08/31/2011  . Smokeless tobacco: Never Used  . Alcohol Use: No    OB History    Grav Para Term Preterm Abortions TAB SAB Ect Mult Living                  Review of Systems  Constitutional: Negative.   HENT: Negative.   Eyes: Negative.   Respiratory: Negative.   Cardiovascular: Negative.   Gastrointestinal: Negative.   Genitourinary: Negative.   Musculoskeletal: Negative.   Skin: Negative.   Neurological: Negative.   Hematological: Negative.   Psychiatric/Behavioral: Negative.     Allergies  Contrast media; Iohexol; Penicillins; and Sulfa antibiotics  Home Medications   Current Outpatient Rx  Name Route Sig Dispense Refill  .  ALPRAZOLAM 0.5 MG PO TABS Oral Take 0.5-1 mg by mouth 2 (two) times daily. Takes 1 tab in the am & 2 tabs at night    . ASPIRIN 325 MG PO TABS Oral Take 325 mg by mouth daily.      Marland Kitchen CALCIUM CARBONATE 600 MG PO TABS Oral Take 600 mg by mouth daily.    Marland Kitchen CARBAMAZEPINE 100 MG PO CHEW Oral Chew 50-100 mg by mouth 2 (two) times daily.    Marland Kitchen VITAMIN D3 5000 UNITS PO CAPS Oral Take 1 capsule by mouth daily.      . FUROSEMIDE 20 MG PO TABS Oral Take 1 tablet (20 mg total) by mouth every morning. 30 tablet 11  . LEVOTHYROXINE SODIUM 150 MCG PO TABS Oral Take 150 mcg by mouth every morning.      Marland Kitchen LISINOPRIL 5 MG PO TABS Oral Take 1 tablet (5 mg total) by mouth daily. 30 tablet 0    BP 163/79  Pulse 73  Temp(Src) 98.2 F (36.8 C) (Oral)  Resp 18  SpO2 95%  Physical Exam  Constitutional: She is oriented to person, place, and time. She appears well-developed and  well-nourished.  HENT:  Head: Normocephalic and atraumatic.  Eyes: Conjunctivae and EOM are normal. Pupils are equal, round, and reactive to light.  Neck: Normal range of motion. Neck supple.  Cardiovascular: Normal rate and regular rhythm.   Pulmonary/Chest: Effort normal and breath sounds normal.  Abdominal: Soft. Bowel sounds are normal.  Musculoskeletal: Normal range of motion.  Neurological: She is alert and oriented to person, place, and time. No cranial nerve deficit.       Mild (L) sided facial " twitching" noted occasionally. No facial droop or other focal neurological deficit.  Skin: Skin is warm and dry. No erythema.  Psychiatric: Her mood appears anxious.    ED Course  Procedures Pt very anxious about persistent facial twitching. Pt also has had multiple serious health issues recently requiring hospitalization. Found out today that the AAA that doctors have been medically managing may now require surgery. Currently denies CP or SOB or other c/o's. Pt now insistent that she wants to go home. Will give dose of Tegretol and Ativan and plan for d/c home. I have discussed pt w/ Dr Alto Denver who has also seen and spoken w/ pt and is agreeable w/ plan..   Labs Reviewed - No data to display No results found.   1. Facial twitching       MDM  HPI/PE and clinical findings/course c/w 1. Facial muscle fasiculations (under doctor's care) 2. Anxiety        Leanne Chang, NP 11/28/11 1318  Roma Kayser Schorr, NP 11/28/11 1319  Roma Kayser Schorr, NP 01/21/12 1214

## 2011-12-01 NOTE — ED Provider Notes (Signed)
Medical screening examination/treatment/procedure(s) were conducted as a shared visit with non-physician practitioner(s) and myself.  I personally evaluated the patient during the encounter  Yana Schorr, MD 12/01/11 1144 

## 2011-12-05 ENCOUNTER — Telehealth: Payer: Self-pay | Admitting: Cardiology

## 2011-12-05 NOTE — Telephone Encounter (Signed)
New Problem   Patient husband [name] has questions about upcoming appnts, please return call on hm#

## 2011-12-05 NOTE — Telephone Encounter (Signed)
Mr Harriette Ohara wanted to know if it is ok for Mrs Buras to see Dr Myrtis Ser on 12/13/11 and Dr Hart Rochester on 12/17/11?  He states that Dr Hart Rochester wanted to see pt same day as Dr Myrtis Ser.  I recommended that he call Dr Candie Chroman office to find out this information since it was Dr Hart Rochester who was requesting the appt on the same day.  He agreed.

## 2011-12-06 ENCOUNTER — Emergency Department (HOSPITAL_COMMUNITY)
Admission: EM | Admit: 2011-12-06 | Discharge: 2011-12-06 | Disposition: A | Discharge status: Home / Self Care | Payer: Medicare Other | Attending: Emergency Medicine | Admitting: Emergency Medicine

## 2011-12-06 ENCOUNTER — Emergency Department (HOSPITAL_COMMUNITY): Payer: Medicare Other

## 2011-12-06 ENCOUNTER — Encounter (HOSPITAL_COMMUNITY): Payer: Self-pay | Admitting: Emergency Medicine

## 2011-12-06 DIAGNOSIS — R209 Unspecified disturbances of skin sensation: Secondary | ICD-10-CM | POA: Insufficient documentation

## 2011-12-06 DIAGNOSIS — I252 Old myocardial infarction: Secondary | ICD-10-CM | POA: Insufficient documentation

## 2011-12-06 DIAGNOSIS — J449 Chronic obstructive pulmonary disease, unspecified: Secondary | ICD-10-CM | POA: Insufficient documentation

## 2011-12-06 DIAGNOSIS — E119 Type 2 diabetes mellitus without complications: Secondary | ICD-10-CM | POA: Insufficient documentation

## 2011-12-06 DIAGNOSIS — I714 Abdominal aortic aneurysm, without rupture, unspecified: Secondary | ICD-10-CM | POA: Insufficient documentation

## 2011-12-06 DIAGNOSIS — R5381 Other malaise: Secondary | ICD-10-CM | POA: Insufficient documentation

## 2011-12-06 DIAGNOSIS — R220 Localized swelling, mass and lump, head: Secondary | ICD-10-CM | POA: Insufficient documentation

## 2011-12-06 DIAGNOSIS — R51 Headache: Secondary | ICD-10-CM | POA: Insufficient documentation

## 2011-12-06 DIAGNOSIS — E0590 Thyrotoxicosis, unspecified without thyrotoxic crisis or storm: Secondary | ICD-10-CM | POA: Insufficient documentation

## 2011-12-06 DIAGNOSIS — R569 Unspecified convulsions: Secondary | ICD-10-CM | POA: Insufficient documentation

## 2011-12-06 DIAGNOSIS — R55 Syncope and collapse: Secondary | ICD-10-CM | POA: Insufficient documentation

## 2011-12-06 DIAGNOSIS — E039 Hypothyroidism, unspecified: Secondary | ICD-10-CM | POA: Insufficient documentation

## 2011-12-06 DIAGNOSIS — J4489 Other specified chronic obstructive pulmonary disease: Secondary | ICD-10-CM | POA: Insufficient documentation

## 2011-12-06 DIAGNOSIS — K219 Gastro-esophageal reflux disease without esophagitis: Secondary | ICD-10-CM | POA: Insufficient documentation

## 2011-12-06 DIAGNOSIS — R202 Paresthesia of skin: Secondary | ICD-10-CM

## 2011-12-06 LAB — POCT I-STAT, CHEM 8
Calcium, Ion: 1.16 mmol/L (ref 1.12–1.32)
HCT: 38 % (ref 36.0–46.0)
Hemoglobin: 12.9 g/dL (ref 12.0–15.0)
TCO2: 32 mmol/L (ref 0–100)

## 2011-12-06 LAB — SEDIMENTATION RATE: Sed Rate: 10 mm/hr (ref 0–22)

## 2011-12-06 MED ORDER — ACETAMINOPHEN 325 MG PO TABS
650.0000 mg | ORAL_TABLET | Freq: Once | ORAL | Status: AC
  Administered 2011-12-06: 650 mg via ORAL
  Filled 2011-12-06: qty 2

## 2011-12-06 MED ORDER — ACETAMINOPHEN 325 MG PO TABS: 975.0000 mg | ORAL_TABLET | Freq: Once | ORAL | Status: DC

## 2011-12-06 MED ORDER — POTASSIUM CHLORIDE CRYS ER 20 MEQ PO TBCR
40.0000 meq | EXTENDED_RELEASE_TABLET | Freq: Once | ORAL | Status: AC
  Administered 2011-12-06: 40 meq via ORAL
  Filled 2011-12-06: qty 2

## 2011-12-06 NOTE — ED Notes (Signed)
MD at bedside. 

## 2011-12-06 NOTE — ED Notes (Signed)
Via EMS. Pt c/o muscle spasms in her left cheek and numbness in left hand intermittently x two hours today but has been going on for two months. Has seen neurologist for same and given tegretol. EMS reports she will not take this medicine because it "makes her feel funny." Has been seen for same eleven times since October here to [facility]. VSS.

## 2011-12-06 NOTE — ED Notes (Signed)
DR. Ethelda Chick AT BEDSIDE SPEAKING WITH PT. ON PLAN OF CARE.

## 2011-12-06 NOTE — ED Notes (Signed)
ASSUMED CARE ON PT. WITH NO PAIN OR DISCOMFORT , RESPIRATIONS UNLABORED , FAMILY AT BEDSIDE , RESTING WITH NO DISTRESS.

## 2011-12-06 NOTE — ED Notes (Signed)
Patient transported to CT stable and in no acute distress.  

## 2011-12-06 NOTE — ED Provider Notes (Addendum)
History     CSN: [id number]  Arrival date & time 12/06/11  1740   First MD Initiated Contact with Patient 12/06/11 1750      Chief Complaint  Patient presents with  . Weakness   Chief complaint headache and numbness (Consider location/radiation/quality/duration/timing/severity/associated sxs/prior treatment) HPI Complains of left-sided headache and and numbness in left face intermittently for approximately the past 5 months. Patient states she will cut this morning with the same symptom. Her primary care doctor had treated her with Tegretol for the same complaint. Patient reports that Dr.Dohmeier.Tegretol and has been treating her with Xanax for the same complaint however the Xanax has not worked today. Patient had MRI scan of brain December 2012 which showed no acute abnormality. She reports "a choking feeling in her throat , left side however is able to swallow well. Denies dyspnea no other complaint no other associated symptoms. No visual change. Denies numbness in her hand Past Medical History  Diagnosis Date  . Diabetes mellitus   . Thyroid disease   . Abdominal aneurysm   . COPD (chronic obstructive pulmonary disease)   . Hypertension   . Shortness of breath   . Hypothyroidism   . Depression   . Recurrent upper respiratory infection (URI)   . Seizures   . Ejection fraction     NL EF 10/2011 echo  . Abnormal EKG     November, 2012  . Contrast media allergy     Patient feels poorly with contrast  . AAA (abdominal aortic aneurysm)     10/2011 - 5.4cm - scheduled to f/u Dr. Hart Rochester  . Pneumonia     10/2011  . Pre-syncope     vasovagal w/ heart block  . Parotid mass     has refused further eval 10/2011  . Chest pain at rest     Myoview 10/14/2011: ef 60%, ? dist anteroseptal scar w/ sublte peri-infarct ischemia vs. attenuation  . Hyperthyroidism   . CHF (congestive heart failure)   . GERD (gastroesophageal reflux disease)   . Headache   . Arthritis   . Anxiety   .  Dysrhythmia   . Myocardial infarction   . Anxiety     Past Surgical History  Procedure Date  . Cholecystectomy   . Joint replacement   . Sinus surgery with instatrak   . Appendectomy   . Oophorectomy   . Arthroscopic repair acl   . Cardiac catheterization   . Eye surgery     Family History  Problem Relation Age of Onset  . Cancer Father     History  Substance Use Topics  . Smoking status: Former Smoker -- 1.0 packs/day for 65 years    Types: Cigarettes    Quit date: 08/31/2011  . Smokeless tobacco: Never Used  . Alcohol Use: No    OB History    Grav Para Term Preterm Abortions TAB SAB Ect Mult Living                  Review of Systems  Constitutional: Negative.   Respiratory: Negative.   Cardiovascular: Negative.   Gastrointestinal: Negative.   Musculoskeletal: Negative.   Skin: Negative.   Neurological: Positive for numbness and headaches. Negative for speech difficulty.       Walks with walker  Hematological: Negative.   Psychiatric/Behavioral: Negative.   All other systems reviewed and are negative.    Allergies  Contrast media; Iohexol; Penicillins; and Sulfa antibiotics  Home Medications   Current Outpatient Rx  Name Route Sig Dispense Refill  . ALPRAZOLAM 0.5 MG PO TABS Oral Take 0.5-1 mg by mouth 2 (two) times daily. Takes 1 tab in the am & 2 tabs at night    . ASPIRIN 325 MG PO TABS Oral Take 325 mg by mouth daily.      Marland Kitchen CALCIUM CARBONATE 600 MG PO TABS Oral Take 600 mg by mouth daily.    Marland Kitchen VITAMIN D3 5000 UNITS PO CAPS Oral Take 1 capsule by mouth daily.      . FUROSEMIDE 20 MG PO TABS Oral Take 1 tablet (20 mg total) by mouth every morning. 30 tablet 11  . LEVOTHYROXINE SODIUM 150 MCG PO TABS Oral Take 150 mcg by mouth every morning.      Marland Kitchen LISINOPRIL 5 MG PO TABS Oral Take 1 tablet (5 mg total) by mouth daily. 30 tablet 0    BP 163/84  Pulse 85  Temp(Src) 98.7 F (37.1 C) (Oral)  Resp 20  SpO2 96%  Physical Exam  Nursing note and  vitals reviewed. Constitutional: She appears well-developed and well-nourished.  HENT:  Head: Normocephalic and atraumatic.  Right Ear: External ear normal.  Left Ear: External ear normal.       No facial asymmetry  Eyes: Conjunctivae are normal. Pupils are equal, round, and reactive to light.  Neck: Neck supple. No tracheal deviation present. No thyromegaly present.       No bruit  Cardiovascular: Normal rate and regular rhythm.   No murmur heard. Pulmonary/Chest: Effort normal and breath sounds normal.  Abdominal: Soft. Bowel sounds are normal. She exhibits no distension. There is no tenderness.  Musculoskeletal: Normal range of motion. She exhibits no edema and no tenderness.  Lymphadenopathy:    She has no cervical adenopathy.  Neurological: She is alert. She displays normal reflexes. Coordination normal.       Walks with minimal assistance  Skin: Skin is warm and dry. No rash noted.  Psychiatric: She has a normal mood and affect.    ED Course  Procedures (including critical care time) 8:30 PM patient is awake alert symptoms unchanged exam remains unchanged there is no facial asymmetry no indications of acute stroke Labs Reviewed - No data to display No results found.   No diagnosis found.    MDM  Assessment symptoms chronic; strongly doubt acute stroke doubt temporal arteritis with normal sedimentation rate. Plan keep appointment with Dr.Dohmeier next week. Blood pressure recheck within the next 1or 2 weeks Diagnosis #1 nonspecific Headache #2 hypokalemia #3 hypertension #4 paresthesias       Doug Sou, MD 12/06/11 2035  Doug Sou, MD 12/06/11 1610  Doug Sou, MD 12/06/11 2038

## 2011-12-09 NOTE — Telephone Encounter (Signed)
Noted  

## 2011-12-11 ENCOUNTER — Encounter: Payer: Self-pay | Admitting: Cardiology

## 2011-12-12 ENCOUNTER — Encounter: Payer: Self-pay | Admitting: Cardiology

## 2011-12-12 DIAGNOSIS — R55 Syncope and collapse: Secondary | ICD-10-CM | POA: Insufficient documentation

## 2011-12-13 ENCOUNTER — Ambulatory Visit (INDEPENDENT_AMBULATORY_CARE_PROVIDER_SITE_OTHER): Payer: Medicare Other | Admitting: Cardiology

## 2011-12-13 ENCOUNTER — Encounter: Payer: Self-pay | Admitting: Cardiology

## 2011-12-13 DIAGNOSIS — G514 Facial myokymia: Secondary | ICD-10-CM

## 2011-12-13 DIAGNOSIS — I498 Other specified cardiac arrhythmias: Secondary | ICD-10-CM

## 2011-12-13 DIAGNOSIS — R001 Bradycardia, unspecified: Secondary | ICD-10-CM

## 2011-12-13 DIAGNOSIS — G518 Other disorders of facial nerve: Secondary | ICD-10-CM

## 2011-12-13 DIAGNOSIS — R55 Syncope and collapse: Secondary | ICD-10-CM

## 2011-12-13 DIAGNOSIS — I714 Abdominal aortic aneurysm, without rupture, unspecified: Secondary | ICD-10-CM

## 2011-12-13 DIAGNOSIS — Z01810 Encounter for preprocedural cardiovascular examination: Secondary | ICD-10-CM

## 2011-12-13 DIAGNOSIS — R079 Chest pain, unspecified: Secondary | ICD-10-CM

## 2011-12-13 DIAGNOSIS — I509 Heart failure, unspecified: Secondary | ICD-10-CM

## 2011-12-13 NOTE — Patient Instructions (Signed)
You have been cleared for your upcoming surgery from a cardiac standpoint  Your physician recommends that you schedule a follow-up appointment in: 3 months.

## 2011-12-13 NOTE — Assessment & Plan Note (Signed)
In the fall of 2012 the patient had a period when her ejection fraction was decreased of unknown etiology. She had some mild CHF at that time to improve. She has no ongoing CHF and her ejection fraction has normalized.

## 2011-12-13 NOTE — Assessment & Plan Note (Signed)
The patient is cleared for abdominal aortic aneurysm procedure. She has a complex history as outlined. Her EKG at rest is abnormal. However we know from recent data that her LV function is normal. We know also that she has only minimal coronary artery disease. She is a good candidate for the procedure. She will have to be followed carefully after her procedure.

## 2011-12-13 NOTE — Progress Notes (Signed)
HPI Patient is seen today as a preop cardiac exam for the possibility of repair of her abdominal aortic aneurysm. The patient has a very complex cardiac history. As part of today's evaluation I have reviewed hospital records from several admissions. During one. She had presyncope with AV block that was thought to be vagal.  Later she had chest discomfort that was stable and she was discharged home. She then came back in with further chest discomfort and catheterization was done showing only minimal disease. In addition there was a period of time that she had decreased left ventricular function that was unexplained. Followup echo has shown normalization of her LV function. Currently she has no chest pain or signs of heart failure.  Allergies  Allergen Reactions  . Contrast Media (Iodinated Diagnostic Agents) Anaphylaxis  . Iohexol      Desc: PT STATES SHE HAD A SEVERE REACTION TO IV CONRAST WITH THROAT SWELLING AND SOB. SHE WAS ADMITTED TO THE HOSPITAL. SHE HAS NEVER HAD CONTRAST AGAIN.   Marland Kitchen Penicillins Rash  . Sulfa Antibiotics Anaphylaxis    Current Outpatient Prescriptions  Medication Sig Dispense Refill  . ALPRAZolam (XANAX) 0.5 MG tablet Take 0.5-1 mg by mouth 2 (two) times daily. Takes 1 tab in the am & 2 tabs at night      . aspirin 81 MG tablet Take 160 mg by mouth daily.      . calcium carbonate (OS-CAL) 600 MG TABS Take 600 mg by mouth daily.      . Cholecalciferol (VITAMIN D3) 5000 UNITS CAPS Take 1 capsule by mouth daily.        . hydrochlorothiazide (HYDRODIURIL) 25 MG tablet Take 25 mg by mouth daily.      Marland Kitchen levothyroxine (SYNTHROID, LEVOTHROID) 150 MCG tablet Take 150 mcg by mouth every morning.          History   Social History  . Marital Status: Married    Spouse Name: N/A    Number of Children: N/A  . Years of Education: N/A   Occupational History  . Not on file.   Social History Main Topics  . Smoking status: Former Smoker -- 1.0 packs/day for 65 years    Types:  Cigarettes    Quit date: 08/31/2011  . Smokeless tobacco: Never Used  . Alcohol Use: No  . Drug Use: No  . Sexually Active: Not on file   Other Topics Concern  . Not on file   Social History Narrative  . No narrative on file    Family History  Problem Relation Age of Onset  . Cancer Father     Past Medical History  Diagnosis Date  . Diabetes mellitus   . COPD (chronic obstructive pulmonary disease)   . Hypertension   . Shortness of breath   . Hypothyroidism   . Depression   . Recurrent upper respiratory infection (URI)   . Seizures   . Ejection fraction     EF 40%, echo, November, 2012  / in improved, EF 60%, echo, December, 2012  . Abnormal EKG     November, 2012  . Contrast media allergy     Patient feels poorly with contrast  . AAA (abdominal aortic aneurysm)     10/2011 - 5.4cm - scheduled to f/u Dr. Hart Rochester  . Pneumonia     10/2011  . Pre-syncope     vasovagal w/ heart block  . Parotid mass     has refused further eval 10/2011  . Chest  pain at rest     Myoview 10/14/2011: ef 60%, ? dist anteroseptal scar w/ sublte peri-infarct ischemia vs. attenuation  . Hyperthyroidism   . CHF (congestive heart failure)   . GERD (gastroesophageal reflux disease)   . Headache   . Arthritis   . Anxiety   . Preop cardiovascular exam     Abdominal aortic aneurysm repair by Beverely Low    Past Surgical History  Procedure Date  . Cholecystectomy   . Joint replacement   . Sinus surgery with instatrak   . Appendectomy   . Oophorectomy   . Arthroscopic repair acl   . Cardiac catheterization   . Eye surgery     ROS   Patient denies fever, chills, headache, sweats, rash, change in vision, change in hearing, chest pain, cough, nausea vomiting, urinary symptoms. The patient does have twitching of muscles of her face. This has been evaluated over time on several occasions. She is seen by neurology. This is not cardiac. It is stable. All other systems are reviewed and are  negative.  PHYSICAL EXAM  Patient is here with her husband. She is oriented to person time and place. Affect is normal. Her only concern is about her facial twitching. She is aware that she is to have a procedure for her abdominal aorta. She is in favor of proceeding with this. There is no jugulovenous distention. Lungs are clear. Respiratory effort is nonlabored. Cardiac exam reveals S1 and S2. There no clicks or significant murmurs. The abdomen is soft. Is no peripheral edema. There no musculoskeletal deformities. There are no skin rashes.  Filed Vitals:   12/13/11 0902  BP: 126/64  Pulse: 75  Height: 5\' 5"  (1.651 m)  Weight: 159 lb (72.122 kg)    ASSESSMENT & PLAN

## 2011-12-13 NOTE — Assessment & Plan Note (Signed)
The patient has had some chest pain over time. However catheterization November 04, 2011 showed only minimal nonobstructive disease with several 20-30% lesions. There was also a nuclear scan showing no ischemia prior to that. LV function is normal. No further workup.

## 2011-12-13 NOTE — Assessment & Plan Note (Signed)
Her facial twitching is an ongoing problem. She is stable with this. It is important that everyone is aware this so that he could be seen during her hospitalization there is not excess concern that there has been a neurologic event. She is followed by neurology.

## 2011-12-13 NOTE — Assessment & Plan Note (Signed)
This has been very actively followed by Dr. Hart Rochester and it is now time for procedure. The patient is cleared for this from the cardiac viewpoint.

## 2011-12-13 NOTE — Assessment & Plan Note (Signed)
The patient had an episode of presyncope in the fall of 2012. This was actually witnessed in the hospital. She had a marked vagal response with marked bradycardia. The exact etiology was never known but it was felt that she did not need a pacemaker. Her rhythm will have to be followed carefully during any procedure.

## 2011-12-13 NOTE — Assessment & Plan Note (Signed)
The patient has had some sinus bradycardia but this is been stable. No further workup.

## 2011-12-16 ENCOUNTER — Encounter: Payer: Self-pay | Admitting: Vascular Surgery

## 2011-12-17 ENCOUNTER — Encounter (HOSPITAL_COMMUNITY): Payer: Self-pay | Admitting: Pharmacy Technician

## 2011-12-17 ENCOUNTER — Encounter: Payer: Self-pay | Admitting: Vascular Surgery

## 2011-12-17 ENCOUNTER — Ambulatory Visit (INDEPENDENT_AMBULATORY_CARE_PROVIDER_SITE_OTHER): Payer: Medicare Other | Admitting: Vascular Surgery

## 2011-12-17 ENCOUNTER — Other Ambulatory Visit: Payer: Self-pay | Admitting: Vascular Surgery

## 2011-12-17 ENCOUNTER — Ambulatory Visit
Admission: RE | Admit: 2011-12-17 | Discharge: 2011-12-17 | Disposition: A | Discharge status: Home / Self Care | Payer: Medicare Other | Source: Ambulatory Visit | Attending: Vascular Surgery | Admitting: Vascular Surgery

## 2011-12-17 VITALS — BP 147/101 | HR 118 | Resp 16 | Ht 65.0 in | Wt 160.0 lb

## 2011-12-17 DIAGNOSIS — I714 Abdominal aortic aneurysm, without rupture: Secondary | ICD-10-CM

## 2011-12-17 NOTE — Progress Notes (Signed)
Subjective:     Patient ID: Paula [name], female   DOB: Aug 27, 1927, 78 y.o.   MRN: [id number]  HPI this 78 year old female returns today for further discussion regarding her abdominal aortic aneurysm which I have been following. She had an evaluation by Dr. Jerral Bonito who has cleared her from a cardiac standpoint. She has a normal ejection fraction. CT scan was performed today but could not be done with contrast because of the patient's history of a contrast reaction. I have reviewed the CT scan and the patient is not a stent graft candidate because of the diameter of the neck and the angulated neck. She will need open repair of this infrarenal aortic aneurysm.   Past Medical History  Diagnosis Date  . Diabetes mellitus   . COPD (chronic obstructive pulmonary disease)   . Hypertension   . Shortness of breath   . Hypothyroidism   . Depression   . Recurrent upper respiratory infection (URI)   . Seizures   . Ejection fraction     EF 40%, echo, November, 2012  / in improved, EF 60%, echo, December, 2012  . Abnormal EKG     November, 2012  . Contrast media allergy     Patient feels poorly with contrast  . AAA (abdominal aortic aneurysm)     10/2011 - 5.4cm - scheduled to f/u Dr. Hart Rochester  . Pneumonia     10/2011  . Pre-syncope     vasovagal w/ heart block  . Parotid mass     has refused further eval 10/2011  . Chest pain at rest     Myoview 10/14/2011: ef 60%, ? dist anteroseptal scar w/ sublte peri-infarct ischemia vs. attenuation  . Hyperthyroidism   . CHF (congestive heart failure)   . GERD (gastroesophageal reflux disease)   . Headache   . Arthritis   . Anxiety   . Preop cardiovascular exam     Abdominal aortic aneurysm repair by Dr.Venice Liz    History  Substance Use Topics  . Smoking status: Former Smoker -- 1.0 packs/day for 65 years    Types: Cigarettes    Quit date: 08/31/2011  . Smokeless tobacco: Never Used  . Alcohol Use: No    Family History  Problem Relation Age  of Onset  . Cancer Father     Allergies  Allergen Reactions  . Contrast Media (Iodinated Diagnostic Agents) Anaphylaxis  . Iohexol      Desc: PT STATES SHE HAD A SEVERE REACTION TO IV CONRAST WITH THROAT SWELLING AND SOB. SHE WAS ADMITTED TO THE HOSPITAL. SHE HAS NEVER HAD CONTRAST AGAIN.   Marland Kitchen Penicillins Rash  . Sulfa Antibiotics Anaphylaxis    Current outpatient prescriptions:ALPRAZolam (XANAX) 0.5 MG tablet, Take 0.5-1 mg by mouth 2 (two) times daily. Takes 1 tab in the am & 2 tabs at night, Disp: , Rfl: ;  aspirin 81 MG tablet, Take 160 mg by mouth daily., Disp: , Rfl: ;  calcium carbonate (OS-CAL) 600 MG TABS, Take 600 mg by mouth daily., Disp: , Rfl: ;  Cholecalciferol (VITAMIN D3) 5000 UNITS CAPS, Take 1 capsule by mouth daily.  , Disp: , Rfl:  hydrochlorothiazide (HYDRODIURIL) 25 MG tablet, Take 25 mg by mouth daily., Disp: , Rfl: ;  levothyroxine (SYNTHROID, LEVOTHROID) 150 MCG tablet, Take 150 mcg by mouth every morning.  , Disp: , Rfl:   BP 147/101  Pulse 118  Resp 16  Ht 5\' 5"  (1.651 m)  Wt 160 lb (72.576 kg)  BMI  26.63 kg/m2  SpO2 96%  Body mass index is 26.63 kg/(m^2).         Review of Systems denies chest pain. Does have mild dyspnea on exertion from chronic tobacco abuse but quit smoking in October of 2012. Denies claudication symptoms or hemoptysis.    Objective:   Physical Exam blood pressure 147/101 heart rate 118 respirations 16 General elderly female no apparent distress alert and oriented x3 HEENT normal for age Chest no rhonchi or wheezing Cardiovascular regular rhythm no murmurs carotid pulses 3+ no audible bruits Abdomen soft with. Pulsatile mass in mid epigastric Lower extremities 3+ femoral and posterior tibial pulses bilaterally Neurologic normal Skin free of rashes Musculoskeletal 3 major deformities     Assessment:     Infrarenal abdominal aortic aneurysm-not candidate for aortic stent graft    Plan:     Plan resection and grafting  of abdominal aortic aneurysm on Friday, February 8 Ms. and benefits of been thoroughly discussed with patient and her husband and they would like to proceed

## 2011-12-18 ENCOUNTER — Encounter (HOSPITAL_COMMUNITY): Payer: Self-pay

## 2011-12-18 ENCOUNTER — Other Ambulatory Visit: Payer: Self-pay

## 2011-12-18 ENCOUNTER — Encounter (HOSPITAL_COMMUNITY)
Admission: RE | Admit: 2011-12-18 | Discharge: 2011-12-18 | Disposition: A | Discharge status: Home / Self Care | Payer: Medicare Other | Source: Ambulatory Visit | Attending: Anesthesiology | Admitting: Anesthesiology

## 2011-12-18 ENCOUNTER — Encounter (HOSPITAL_COMMUNITY)
Admission: RE | Admit: 2011-12-18 | Discharge: 2011-12-18 | Disposition: A | Discharge status: Home / Self Care | Payer: Medicare Other | Source: Ambulatory Visit | Attending: Vascular Surgery | Admitting: Vascular Surgery

## 2011-12-18 HISTORY — DX: Tic disorder, unspecified: F95.9

## 2011-12-18 LAB — CBC
MCH: 33.4 pg (ref 26.0–34.0)
Platelets: 388 10*3/uL (ref 150–400)
RBC: 3.77 MIL/uL — ABNORMAL LOW (ref 3.87–5.11)
WBC: 17 10*3/uL — ABNORMAL HIGH (ref 4.0–10.5)

## 2011-12-18 LAB — COMPREHENSIVE METABOLIC PANEL
ALT: 13 U/L (ref 0–35)
AST: 14 U/L (ref 0–37)
CO2: 24 mEq/L (ref 19–32)
Calcium: 10.6 mg/dL — ABNORMAL HIGH (ref 8.4–10.5)
Chloride: 102 mEq/L (ref 96–112)
GFR calc non Af Amer: 75 mL/min — ABNORMAL LOW (ref >90)
Sodium: 140 mEq/L (ref 135–145)

## 2011-12-18 LAB — DIFFERENTIAL
Basophils Absolute: 0.1 10*3/uL (ref 0.0–0.1)
Eosinophils Relative: 1 % (ref 0–5)
Lymphocytes Relative: 30 % (ref 12–46)
Monocytes Absolute: 1.1 10*3/uL — ABNORMAL HIGH (ref 0.1–1.0)
Monocytes Relative: 6 % (ref 3–12)

## 2011-12-18 LAB — URINALYSIS, ROUTINE W REFLEX MICROSCOPIC
Ketones, ur: NEGATIVE mg/dL
Protein, ur: NEGATIVE mg/dL
Urobilinogen, UA: 0.2 mg/dL (ref 0.0–1.0)

## 2011-12-18 LAB — URINE MICROSCOPIC-ADD ON

## 2011-12-18 LAB — BLOOD GAS, ARTERIAL
Drawn by: 181601
FIO2: 0.21 %
pCO2 arterial: 37.1 mmHg (ref 35.0–45.0)
pO2, Arterial: 80.5 mmHg (ref 80.0–100.0)

## 2011-12-18 LAB — APTT: aPTT: 41 seconds — ABNORMAL HIGH (ref 24–37)

## 2011-12-18 LAB — PREPARE RBC (CROSSMATCH)

## 2011-12-18 NOTE — Progress Notes (Signed)
Chart given to anesthesia for review of cardiac hx, labs.

## 2011-12-18 NOTE — Pre-Procedure Instructions (Signed)
20 Paula [name]  12/18/2011   Your procedure is scheduled on:  Friday, February 8  Report to [facility] at 5:30 AM.  Call this number if you have problems the morning of surgery: [phone]   Remember:   Do not eat food:After Midnight.  May have clear liquids: up to 4 Hours before arrival.  Clear liquids include soda, tea, black coffee, apple or grape juice, broth.  Take these medicines the morning of surgery with A SIP OF WATER: synthroid, xanax if needed   Do not wear jewelry, make-up or nail polish.  Do not wear lotions, powders, or perfumes. You may wear deodorant.  Do not shave 48 hours prior to surgery.  Do not bring valuables to the hospital.  Contacts, dentures or bridgework may not be worn into surgery.  Leave suitcase in the car. After surgery it may be brought to your room.  For patients admitted to the hospital, checkout time is 11:00 AM the day of discharge.   Patients discharged the day of surgery will not be allowed to drive home.  Name and phone number of your driver: NA  Special Instructions: CHG Shower Use Special Wash: 1/2 bottle night before surgery and 1/2 bottle morning of surgery.   Please read over the following fact sheets that you were given: Pain Booklet, Coughing and Deep Breathing, Blood Transfusion Information and Surgical Site Infection Prevention

## 2011-12-19 ENCOUNTER — Telehealth: Payer: Self-pay

## 2011-12-19 ENCOUNTER — Telehealth: Payer: Self-pay | Admitting: *Deleted

## 2011-12-19 DIAGNOSIS — B999 Unspecified infectious disease: Secondary | ICD-10-CM

## 2011-12-19 MED ORDER — CIPROFLOXACIN HCL 500 MG PO TABS: 500.0000 mg | ORAL_TABLET | Freq: Two times a day (BID) | ORAL | Status: AC

## 2011-12-19 MED ORDER — VANCOMYCIN HCL IN DEXTROSE 1-5 GM/200ML-% IV SOLN
1000.0000 mg | Freq: Once | INTRAVENOUS | Status: AC
  Administered 2011-12-27: 1000 mg via INTRAVENOUS
  Filled 2011-12-19: qty 200

## 2011-12-19 NOTE — Telephone Encounter (Signed)
Dr Hart Rochester wanted me to call Mrs. Rundle to ask if she had a fever,chills,productive cough or burning with urination because her WBC is 17,000. She denied all these symptoms but she generally did not feel well. I asked her to let us know if any of these symptoms developed especially a fever over 100. She said OK. She does not recall ever taking Cipro so I will order it at [facility] [address]per Dr Hart Rochester

## 2011-12-19 NOTE — Consult Note (Signed)
Anesthesia:  Patient is an 78 year old female scheduled for open AAA repair on 12/20/11.  History includes DM2, former smoker, COPD, HTN, hypothyroidism, depression, seizures, CHF,  CP with 10/2011 cath showing minimal nonobstructive disease, "presyncope episode with AV block thought to be vagal", GERD, anxiety, facial tic/twitching which is an "ongoing problem", macular degeneration, parotid mass (patient refused further eval 12//2012), post op N/V.  Her Cardiologist is Dr. Myrtis Ser who cleared her for this procedure.  EKG from 12/18/11 showed SR with first degree AVB, anterolateral T wave abnormality is less evident than on 11/07/11.  Echo on 10/13/12 showed: Left ventricle: The cavity size was normal. Wall thickness was increased in a pattern of moderate LVH. Systolic function was normal. The estimated ejection fraction was in the range of 55% to 60%. Wall motion was normal; there were no regional wall motion abnormalities. Doppler parameters are consistent with abnormal left ventricular relaxation (grade 1 diastolic dysfunction).    Stress test on 10/14/11 showed: Small fixed defect in the distal anteroseptal wall which becomes slightly more prominent on stress. This is probably related to breast attenuation with slight differential breast positioning between the rest and stress images, but a small scar with subtle peri infarct ischemia cannot be completely excluded. Grossly normal left ventricular cavity size with normal left ventricular ejection fraction of 60%.   Cardiac cath was done by Dr. Shirlee Latch on 11/04/11 (see Notes tab, Procedures).  She had 20% left main, 30% proximal and distal LAD, 30% proximal and mid RCA.  She was noted to have a brief run of 2nd degree Type 1 AVB during that procedure.  Labs noted.  WBC 17K.  UA with moderate leukocytes, negative nitrites.  PTT 41.  PT/INR WNL. K 3.2.  Cr 0.79.  I called the UA and WBC results to Oswaldo Done, RN at [facility].  CXR on 12/18/11 showed no evidence of  acute cardiopulmonary disease. Chronic interstitial markings/emphysematous changes with sequela of  prior granulomatous disease.  Okay to proceed from an Anesthesia perspective if no acute changes.  She will need close monitoring of her cardiac rhythm in the perioperative period.

## 2011-12-19 NOTE — Telephone Encounter (Signed)
Husband called regarding pt having symptoms of chills and itching of hand and inner legs that started within last hour.  Denies any rash.  States started Cipro, and had one dose, but has taken Cipro before, without problems.  States pt. Took the Magnesium Citrate, and questioned if it could have caused symptoms.  Unable to check temp due to no thermometer, but states pt. feels warm. Reported symptoms to Dr. Hart Rochester.  Advised to cancel surgery 2/8, and reschedule to 2/13.  Will contact pt. on Mon. 2/11 for status report.  Pt. to be advised to take Benadryl OTC for itching, and to continue Cipro, unless itching persists or rash occurs.  Advised pt. of Dr. Candie Chroman recommendations.   Verb. Understanding.

## 2011-12-20 ENCOUNTER — Other Ambulatory Visit: Payer: Self-pay

## 2011-12-20 DIAGNOSIS — I714 Abdominal aortic aneurysm, without rupture: Secondary | ICD-10-CM

## 2011-12-20 DIAGNOSIS — R509 Fever, unspecified: Secondary | ICD-10-CM

## 2011-12-23 ENCOUNTER — Other Ambulatory Visit: Payer: Self-pay | Admitting: Vascular Surgery

## 2011-12-23 LAB — BASIC METABOLIC PANEL
BUN: 14 mg/dL (ref 6–23)
CO2: 33 mEq/L — ABNORMAL HIGH (ref 19–32)
Calcium: 10.3 mg/dL (ref 8.4–10.5)
Chloride: 102 mEq/L (ref 96–112)
Creat: 0.8 mg/dL (ref 0.50–1.10)
Glucose, Bld: 117 mg/dL — ABNORMAL HIGH (ref 70–99)

## 2011-12-23 LAB — CBC WITH DIFFERENTIAL/PLATELET
Lymphocytes Relative: 19 % (ref 12–46)
Lymphs Abs: 2.4 10*3/uL (ref 0.7–4.0)
MCV: 100.3 fL — ABNORMAL HIGH (ref 78.0–100.0)
Neutro Abs: 10 10*3/uL — ABNORMAL HIGH (ref 1.7–7.7)
Platelets: 310 10*3/uL (ref 150–400)
RBC: 3.69 MIL/uL — ABNORMAL LOW (ref 3.87–5.11)
RDW: 13.1 % (ref 11.5–15.5)
WBC: 12.9 10*3/uL — ABNORMAL HIGH (ref 4.0–10.5)

## 2011-12-26 ENCOUNTER — Encounter (HOSPITAL_COMMUNITY): Payer: Self-pay | Admitting: *Deleted

## 2011-12-26 MED ORDER — SODIUM CHLORIDE 0.9 % IV SOLN: INTRAVENOUS | Status: DC

## 2011-12-27 ENCOUNTER — Other Ambulatory Visit: Payer: Self-pay

## 2011-12-27 ENCOUNTER — Ambulatory Visit (HOSPITAL_COMMUNITY): Payer: Medicare Other | Admitting: Vascular Surgery

## 2011-12-27 ENCOUNTER — Encounter (HOSPITAL_COMMUNITY): Payer: Self-pay | Admitting: Vascular Surgery

## 2011-12-27 ENCOUNTER — Ambulatory Visit (HOSPITAL_COMMUNITY): Payer: Medicare Other

## 2011-12-27 ENCOUNTER — Other Ambulatory Visit: Payer: Medicare Other

## 2011-12-27 ENCOUNTER — Encounter (HOSPITAL_COMMUNITY): Payer: Self-pay | Admitting: *Deleted

## 2011-12-27 ENCOUNTER — Other Ambulatory Visit: Payer: Self-pay | Admitting: Vascular Surgery

## 2011-12-27 ENCOUNTER — Inpatient Hospital Stay (HOSPITAL_COMMUNITY)
Admission: RE | Admit: 2011-12-27 | Discharge: 2012-01-07 | DRG: 237 | Disposition: A | Discharge status: Home Health | Payer: Medicare Other | Source: Ambulatory Visit | Attending: Vascular Surgery | Admitting: Vascular Surgery

## 2011-12-27 ENCOUNTER — Encounter (HOSPITAL_COMMUNITY)
Admission: RE | Disposition: A | Discharge status: Home Health | Payer: Self-pay | Source: Ambulatory Visit | Attending: Vascular Surgery

## 2011-12-27 DIAGNOSIS — J4489 Other specified chronic obstructive pulmonary disease: Secondary | ICD-10-CM | POA: Diagnosis present

## 2011-12-27 DIAGNOSIS — R220 Localized swelling, mass and lump, head: Secondary | ICD-10-CM | POA: Diagnosis present

## 2011-12-27 DIAGNOSIS — F99 Mental disorder, not otherwise specified: Secondary | ICD-10-CM | POA: Diagnosis not present

## 2011-12-27 DIAGNOSIS — Z91041 Radiographic dye allergy status: Secondary | ICD-10-CM

## 2011-12-27 DIAGNOSIS — E119 Type 2 diabetes mellitus without complications: Secondary | ICD-10-CM | POA: Diagnosis present

## 2011-12-27 DIAGNOSIS — N170 Acute kidney failure with tubular necrosis: Secondary | ICD-10-CM | POA: Diagnosis not present

## 2011-12-27 DIAGNOSIS — Z7982 Long term (current) use of aspirin: Secondary | ICD-10-CM

## 2011-12-27 DIAGNOSIS — F411 Generalized anxiety disorder: Secondary | ICD-10-CM | POA: Diagnosis present

## 2011-12-27 DIAGNOSIS — I714 Abdominal aortic aneurysm, without rupture, unspecified: Principal | ICD-10-CM | POA: Diagnosis present

## 2011-12-27 DIAGNOSIS — E876 Hypokalemia: Secondary | ICD-10-CM | POA: Diagnosis not present

## 2011-12-27 DIAGNOSIS — Z9911 Dependence on respirator [ventilator] status: Secondary | ICD-10-CM

## 2011-12-27 DIAGNOSIS — Z8701 Personal history of pneumonia (recurrent): Secondary | ICD-10-CM

## 2011-12-27 DIAGNOSIS — R5082 Postprocedural fever: Secondary | ICD-10-CM | POA: Diagnosis not present

## 2011-12-27 DIAGNOSIS — E870 Hyperosmolality and hypernatremia: Secondary | ICD-10-CM | POA: Diagnosis not present

## 2011-12-27 DIAGNOSIS — F3289 Other specified depressive episodes: Secondary | ICD-10-CM | POA: Diagnosis present

## 2011-12-27 DIAGNOSIS — Z87891 Personal history of nicotine dependence: Secondary | ICD-10-CM

## 2011-12-27 DIAGNOSIS — D62 Acute posthemorrhagic anemia: Secondary | ICD-10-CM | POA: Diagnosis not present

## 2011-12-27 DIAGNOSIS — Z880 Allergy status to penicillin: Secondary | ICD-10-CM

## 2011-12-27 DIAGNOSIS — Z888 Allergy status to other drugs, medicaments and biological substances status: Secondary | ICD-10-CM

## 2011-12-27 DIAGNOSIS — K219 Gastro-esophageal reflux disease without esophagitis: Secondary | ICD-10-CM | POA: Diagnosis present

## 2011-12-27 DIAGNOSIS — K560 Paralytic ileus: Secondary | ICD-10-CM | POA: Diagnosis not present

## 2011-12-27 DIAGNOSIS — R259 Unspecified abnormal involuntary movements: Secondary | ICD-10-CM | POA: Diagnosis present

## 2011-12-27 DIAGNOSIS — Z882 Allergy status to sulfonamides status: Secondary | ICD-10-CM

## 2011-12-27 DIAGNOSIS — J449 Chronic obstructive pulmonary disease, unspecified: Secondary | ICD-10-CM | POA: Diagnosis present

## 2011-12-27 DIAGNOSIS — R404 Transient alteration of awareness: Secondary | ICD-10-CM | POA: Diagnosis not present

## 2011-12-27 DIAGNOSIS — I509 Heart failure, unspecified: Secondary | ICD-10-CM | POA: Diagnosis present

## 2011-12-27 DIAGNOSIS — E039 Hypothyroidism, unspecified: Secondary | ICD-10-CM | POA: Diagnosis present

## 2011-12-27 DIAGNOSIS — Z01810 Encounter for preprocedural cardiovascular examination: Secondary | ICD-10-CM

## 2011-12-27 DIAGNOSIS — I10 Essential (primary) hypertension: Secondary | ICD-10-CM | POA: Diagnosis present

## 2011-12-27 DIAGNOSIS — K929 Disease of digestive system, unspecified: Secondary | ICD-10-CM | POA: Diagnosis not present

## 2011-12-27 DIAGNOSIS — R509 Fever, unspecified: Secondary | ICD-10-CM

## 2011-12-27 DIAGNOSIS — I498 Other specified cardiac arrhythmias: Secondary | ICD-10-CM | POA: Diagnosis not present

## 2011-12-27 DIAGNOSIS — J9600 Acute respiratory failure, unspecified whether with hypoxia or hypercapnia: Secondary | ICD-10-CM | POA: Diagnosis not present

## 2011-12-27 DIAGNOSIS — F329 Major depressive disorder, single episode, unspecified: Secondary | ICD-10-CM | POA: Diagnosis present

## 2011-12-27 DIAGNOSIS — R221 Localized swelling, mass and lump, neck: Secondary | ICD-10-CM | POA: Diagnosis present

## 2011-12-27 HISTORY — PX: ABDOMINAL AORTIC ANEURYSM REPAIR: SHX42

## 2011-12-27 LAB — BLOOD GAS, ARTERIAL
Bicarbonate: 22.3 mEq/L (ref 20.0–24.0)
PEEP: 5 cmH2O
Patient temperature: 98.6
pCO2 arterial: 44.9 mmHg (ref 35.0–45.0)
pH, Arterial: 7.317 — ABNORMAL LOW (ref 7.350–7.400)

## 2011-12-27 LAB — MAGNESIUM: Magnesium: 1.5 mg/dL (ref 1.5–2.5)

## 2011-12-27 LAB — BASIC METABOLIC PANEL
BUN: 9 mg/dL (ref 6–23)
Calcium: 7.1 mg/dL — ABNORMAL LOW (ref 8.4–10.5)
Chloride: 107 mEq/L (ref 96–112)
Creatinine, Ser: 0.63 mg/dL (ref 0.50–1.10)
GFR calc Af Amer: 75 mL/min — ABNORMAL LOW (ref >90)
GFR calc Af Amer: >90 mL/min (ref >90)
GFR calc non Af Amer: 65 mL/min — ABNORMAL LOW (ref >90)
GFR calc non Af Amer: 81 mL/min — ABNORMAL LOW (ref >90)
Glucose, Bld: 106 mg/dL — ABNORMAL HIGH (ref 70–99)
Potassium: 3.8 mEq/L (ref 3.5–5.1)
Sodium: 139 mEq/L (ref 135–145)

## 2011-12-27 LAB — POCT I-STAT 7, (LYTES, BLD GAS, ICA,H+H)
Acid-Base Excess: 1 mmol/L (ref 0.0–2.0)
Calcium, Ion: 1.19 mmol/L (ref 1.12–1.32)
Calcium, Ion: 1.15 mmol/L (ref 1.12–1.32)
HCT: 34 % — ABNORMAL LOW (ref 36.0–46.0)
HCT: 21 % — ABNORMAL LOW (ref 36.0–46.0)
Hemoglobin: 11.6 g/dL — ABNORMAL LOW (ref 12.0–15.0)
O2 Saturation: 100 %
Patient temperature: 35.2
Potassium: 3 mEq/L — ABNORMAL LOW (ref 3.5–5.1)
Sodium: 138 mEq/L (ref 135–145)
pCO2 arterial: 34 mmHg — ABNORMAL LOW (ref 35.0–45.0)
pO2, Arterial: 251 mmHg — ABNORMAL HIGH (ref 80.0–100.0)

## 2011-12-27 LAB — CBC
HCT: 34.2 % — ABNORMAL LOW (ref 36.0–46.0)
MCH: 30.9 pg (ref 26.0–34.0)
MCHC: 33.3 g/dL (ref 30.0–36.0)
MCV: 92.7 fL (ref 78.0–100.0)
Platelets: 116 10*3/uL — ABNORMAL LOW (ref 150–400)
RDW: 16.9 % — ABNORMAL HIGH (ref 11.5–15.5)

## 2011-12-27 LAB — GLUCOSE, CAPILLARY: Glucose-Capillary: 105 mg/dL — ABNORMAL HIGH (ref 70–99)

## 2011-12-27 LAB — POCT I-STAT 4, (NA,K, GLUC, HGB,HCT)
Glucose, Bld: 155 mg/dL — ABNORMAL HIGH (ref 70–99)
HCT: 23 % — ABNORMAL LOW (ref 36.0–46.0)
Hemoglobin: 7.8 g/dL — ABNORMAL LOW (ref 12.0–15.0)
Potassium: 3.6 mEq/L (ref 3.5–5.1)

## 2011-12-27 LAB — PROTIME-INR: INR: 1.93 — ABNORMAL HIGH (ref 0.00–1.49)

## 2011-12-27 SURGERY — ANEURYSM ABDOMINAL AORTIC REPAIR
Anesthesia: General | Site: Abdomen | Wound class: Clean

## 2011-12-27 MED ORDER — SODIUM CHLORIDE 0.9 % IV SOLN
500.0000 mL | Freq: Once | INTRAVENOUS | Status: AC | PRN
  Administered 2011-12-27: 500 mL via INTRAVENOUS

## 2011-12-27 MED ORDER — ALBUMIN HUMAN 5 % IV SOLN
INTRAVENOUS | Status: DC | PRN
  Administered 2011-12-27 (×2): via INTRAVENOUS

## 2011-12-27 MED ORDER — ONDANSETRON HCL 4 MG/2ML IJ SOLN: 4.0000 mg | Freq: Four times a day (QID) | INTRAMUSCULAR | Status: DC | PRN

## 2011-12-27 MED ORDER — DOPAMINE-DEXTROSE 3.2-5 MG/ML-% IV SOLN
INTRAVENOUS | Status: AC
  Filled 2011-12-27: qty 250

## 2011-12-27 MED ORDER — PHENOL 1.4 % MT LIQD: 1.0000 | OROMUCOSAL | Status: DC | PRN

## 2011-12-27 MED ORDER — DEXTROSE 5 % IV SOLN
INTRAVENOUS | Status: DC | PRN
  Administered 2011-12-27: 08:00:00 via INTRAVENOUS

## 2011-12-27 MED ORDER — MANNITOL 25 % IV SOLN
INTRAVENOUS | Status: DC | PRN
  Administered 2011-12-27: 25 g via INTRAVENOUS

## 2011-12-27 MED ORDER — MIDAZOLAM HCL 2 MG/2ML IJ SOLN
1.0000 mg | INTRAMUSCULAR | Status: DC | PRN
  Filled 2011-12-27: qty 2

## 2011-12-27 MED ORDER — IPRATROPIUM-ALBUTEROL 18-103 MCG/ACT IN AERO
4.0000 | INHALATION_SPRAY | RESPIRATORY_TRACT | Status: DC | PRN
  Filled 2011-12-27: qty 14.7

## 2011-12-27 MED ORDER — ROCURONIUM BROMIDE 100 MG/10ML IV SOLN
INTRAVENOUS | Status: DC | PRN
  Administered 2011-12-27: 20 mg via INTRAVENOUS
  Administered 2011-12-27: 50 mg via INTRAVENOUS
  Administered 2011-12-27: 20 mg via INTRAVENOUS

## 2011-12-27 MED ORDER — PROTAMINE SULFATE 10 MG/ML IV SOLN
INTRAVENOUS | Status: DC | PRN
  Administered 2011-12-27: 100 mg via INTRAVENOUS

## 2011-12-27 MED ORDER — CHLORHEXIDINE GLUCONATE 0.12 % MT SOLN
OROMUCOSAL | Status: AC
  Administered 2011-12-27: 15 mL
  Filled 2011-12-27: qty 15

## 2011-12-27 MED ORDER — ACETAMINOPHEN 650 MG RE SUPP: 325.0000 mg | RECTAL | Status: DC | PRN

## 2011-12-27 MED ORDER — LEVOTHYROXINE SODIUM 100 MCG IV SOLR
75.0000 ug | Freq: Every day | INTRAVENOUS | Status: DC
  Administered 2011-12-27 – 2012-01-03 (×8): 76 ug via INTRAVENOUS
  Administered 2012-01-04: 10:00:00 via INTRAVENOUS
  Administered 2012-01-05 – 2012-01-07 (×3): 76 ug via INTRAVENOUS
  Filled 2011-12-27 (×12): qty 3.8

## 2011-12-27 MED ORDER — INSULIN ASPART 100 UNIT/ML ~~LOC~~ SOLN
0.0000 [IU] | SUBCUTANEOUS | Status: DC
  Administered 2011-12-27 (×3): 5 [IU] via SUBCUTANEOUS
  Administered 2011-12-28: 3 [IU] via SUBCUTANEOUS
  Administered 2011-12-28: 5 [IU] via SUBCUTANEOUS
  Administered 2011-12-28 (×2): 2 [IU] via SUBCUTANEOUS
  Administered 2011-12-28: 3 [IU] via SUBCUTANEOUS
  Administered 2011-12-28 – 2011-12-29 (×2): 2 [IU] via SUBCUTANEOUS
  Administered 2011-12-29: 4 [IU] via SUBCUTANEOUS
  Administered 2011-12-29 – 2012-01-05 (×19): 2 [IU] via SUBCUTANEOUS
  Administered 2012-01-05: 3 [IU] via SUBCUTANEOUS
  Administered 2012-01-06 (×2): 2 [IU] via SUBCUTANEOUS
  Filled 2011-12-27: qty 3

## 2011-12-27 MED ORDER — HYDROMORPHONE HCL PF 1 MG/ML IJ SOLN
INTRAMUSCULAR | Status: AC
  Filled 2011-12-27: qty 1

## 2011-12-27 MED ORDER — NALOXONE HCL 0.4 MG/ML IJ SOLN: 0.4000 mg | INTRAMUSCULAR | Status: DC | PRN

## 2011-12-27 MED ORDER — DIPHENHYDRAMINE HCL 12.5 MG/5ML PO ELIX
12.5000 mg | ORAL_SOLUTION | Freq: Four times a day (QID) | ORAL | Status: DC | PRN
  Filled 2011-12-27: qty 5

## 2011-12-27 MED ORDER — VANCOMYCIN HCL IN DEXTROSE 1-5 GM/200ML-% IV SOLN
1000.0000 mg | Freq: Two times a day (BID) | INTRAVENOUS | Status: DC
  Filled 2011-12-27 (×2): qty 200

## 2011-12-27 MED ORDER — PHENYLEPHRINE HCL 10 MG/ML IJ SOLN
20.0000 mg | INTRAVENOUS | Status: DC | PRN
  Administered 2011-12-27: 50 ug/min via INTRAVENOUS

## 2011-12-27 MED ORDER — 0.9 % SODIUM CHLORIDE (POUR BTL) OPTIME
TOPICAL | Status: DC | PRN
  Administered 2011-12-27: 1000 mL

## 2011-12-27 MED ORDER — KCL IN DEXTROSE-NACL 20-5-0.9 MEQ/L-%-% IV SOLN
INTRAVENOUS | Status: DC
  Administered 2011-12-27: 17:00:00 via INTRAVENOUS
  Filled 2011-12-27 (×3): qty 1000

## 2011-12-27 MED ORDER — GLYCOPYRROLATE 0.2 MG/ML IJ SOLN
INTRAMUSCULAR | Status: DC | PRN
  Administered 2011-12-27: .4 mg via INTRAVENOUS

## 2011-12-27 MED ORDER — MIDAZOLAM HCL 5 MG/5ML IJ SOLN
INTRAMUSCULAR | Status: DC | PRN
  Administered 2011-12-27: 2 mg via INTRAVENOUS

## 2011-12-27 MED ORDER — NEOSTIGMINE METHYLSULFATE 1 MG/ML IJ SOLN
INTRAMUSCULAR | Status: DC | PRN
  Administered 2011-12-27: 3 mg via INTRAVENOUS

## 2011-12-27 MED ORDER — LABETALOL HCL 5 MG/ML IV SOLN
10.0000 mg | INTRAVENOUS | Status: DC | PRN
  Filled 2011-12-27: qty 4

## 2011-12-27 MED ORDER — LACTATED RINGERS IV SOLN
INTRAVENOUS | Status: DC | PRN
  Administered 2011-12-27 (×5): via INTRAVENOUS

## 2011-12-27 MED ORDER — SODIUM CHLORIDE 0.9 % IV SOLN
INTRAVENOUS | Status: DC | PRN
  Administered 2011-12-27 (×2): via INTRAVENOUS

## 2011-12-27 MED ORDER — HYDROMORPHONE 0.3 MG/ML IV SOLN
INTRAVENOUS | Status: DC
  Administered 2011-12-27: 13:00:00 via INTRAVENOUS

## 2011-12-27 MED ORDER — HYDROMORPHONE HCL PF 1 MG/ML IJ SOLN
1.0000 mg | INTRAMUSCULAR | Status: DC | PRN
  Administered 2011-12-27 – 2011-12-28 (×2): 1 mg via INTRAVENOUS
  Administered 2011-12-28: 0.5 mg via INTRAVENOUS
  Administered 2011-12-28: 1 mg via INTRAVENOUS
  Filled 2011-12-27 (×4): qty 1

## 2011-12-27 MED ORDER — HYDRALAZINE HCL 20 MG/ML IJ SOLN
10.0000 mg | INTRAMUSCULAR | Status: DC | PRN
  Filled 2011-12-27 (×2): qty 0.5

## 2011-12-27 MED ORDER — DEXAMETHASONE SODIUM PHOSPHATE 4 MG/ML IJ SOLN
INTRAMUSCULAR | Status: DC | PRN
  Administered 2011-12-27: 4 mg via INTRAVENOUS

## 2011-12-27 MED ORDER — HYDROMORPHONE 0.3 MG/ML IV SOLN
INTRAVENOUS | Status: DC
Start: 2011-12-28 — End: 2011-12-29
  Administered 2011-12-28: 0.79 mg via INTRAVENOUS
  Administered 2011-12-28: 0.2 mg via INTRAVENOUS
  Administered 2011-12-28: 12:00:00 via INTRAVENOUS
  Administered 2011-12-29: 0.3 mg via INTRAVENOUS

## 2011-12-27 MED ORDER — HEPARIN SODIUM (PORCINE) 1000 UNIT/ML IJ SOLN
INTRAMUSCULAR | Status: DC | PRN
  Administered 2011-12-27: 7000 [IU] via INTRAVENOUS

## 2011-12-27 MED ORDER — LEVOTHYROXINE SODIUM 150 MCG PO TABS
150.0000 ug | ORAL_TABLET | Freq: Every day | ORAL | Status: DC
  Filled 2011-12-27: qty 1

## 2011-12-27 MED ORDER — PROPOFOL 10 MG/ML IV EMUL
INTRAVENOUS | Status: DC | PRN
  Administered 2011-12-27: 180 mg via INTRAVENOUS

## 2011-12-27 MED ORDER — MAGNESIUM SULFATE 40 MG/ML IJ SOLN
2.0000 g | Freq: Once | INTRAMUSCULAR | Status: AC | PRN
  Administered 2011-12-27: 2 g via INTRAVENOUS
  Filled 2011-12-27 (×2): qty 50
  Filled 2011-12-27: qty 100

## 2011-12-27 MED ORDER — GUAIFENESIN-DM 100-10 MG/5ML PO SYRP: 15.0000 mL | ORAL_SOLUTION | ORAL | Status: DC | PRN

## 2011-12-27 MED ORDER — FAMOTIDINE IN NACL 20-0.9 MG/50ML-% IV SOLN
20.0000 mg | Freq: Two times a day (BID) | INTRAVENOUS | Status: DC
  Administered 2011-12-27 – 2012-01-05 (×18): 20 mg via INTRAVENOUS
  Filled 2011-12-27 (×22): qty 50

## 2011-12-27 MED ORDER — ACETAMINOPHEN 325 MG PO TABS
325.0000 mg | ORAL_TABLET | ORAL | Status: DC | PRN
  Administered 2012-01-06 – 2012-01-07 (×4): 650 mg via ORAL
  Filled 2011-12-27 (×3): qty 2

## 2011-12-27 MED ORDER — LIDOCAINE HCL 4 % MT SOLN
OROMUCOSAL | Status: DC | PRN
  Administered 2011-12-27: 4 mL via TOPICAL

## 2011-12-27 MED ORDER — DROPERIDOL 2.5 MG/ML IJ SOLN
INTRAMUSCULAR | Status: DC | PRN
  Administered 2011-12-27: 0.625 mg via INTRAVENOUS

## 2011-12-27 MED ORDER — ONDANSETRON HCL 4 MG/2ML IJ SOLN
INTRAMUSCULAR | Status: DC | PRN
  Administered 2011-12-27: 4 mg via INTRAVENOUS

## 2011-12-27 MED ORDER — POTASSIUM CHLORIDE CRYS ER 20 MEQ PO TBCR: 20.0000 meq | EXTENDED_RELEASE_TABLET | Freq: Once | ORAL | Status: AC | PRN

## 2011-12-27 MED ORDER — DOCUSATE SODIUM 100 MG PO CAPS: 100.0000 mg | ORAL_CAPSULE | Freq: Every day | ORAL | Status: DC

## 2011-12-27 MED ORDER — METOPROLOL TARTRATE 1 MG/ML IV SOLN
2.0000 mg | INTRAVENOUS | Status: DC | PRN
  Administered 2012-01-03: 5 mg via INTRAVENOUS
  Filled 2011-12-27: qty 5

## 2011-12-27 MED ORDER — DOPAMINE-DEXTROSE 3.2-5 MG/ML-% IV SOLN: 3.0000 ug/kg/min | INTRAVENOUS | Status: DC

## 2011-12-27 MED ORDER — METOPROLOL TARTRATE 1 MG/ML IV SOLN
5.0000 mg | Freq: Four times a day (QID) | INTRAVENOUS | Status: DC
  Administered 2011-12-27 – 2012-01-02 (×20): 5 mg via INTRAVENOUS
  Filled 2011-12-27 (×41): qty 5

## 2011-12-27 MED ORDER — FENTANYL CITRATE 0.05 MG/ML IJ SOLN
25.0000 ug | INTRAMUSCULAR | Status: DC | PRN
  Administered 2011-12-27 (×2): 25 ug via INTRAVENOUS
  Filled 2011-12-27: qty 2

## 2011-12-27 MED ORDER — HETASTARCH-ELECTROLYTES 6 % IV SOLN
INTRAVENOUS | Status: DC | PRN
  Administered 2011-12-27 (×2): via INTRAVENOUS

## 2011-12-27 MED ORDER — HYDROMORPHONE HCL PF 1 MG/ML IJ SOLN
0.2500 mg | INTRAMUSCULAR | Status: DC | PRN
  Administered 2011-12-27 (×2): 0.5 mg via INTRAVENOUS

## 2011-12-27 MED ORDER — SODIUM CHLORIDE 0.9 % IJ SOLN: 9.0000 mL | INTRAMUSCULAR | Status: DC | PRN

## 2011-12-27 MED ORDER — VANCOMYCIN HCL IN DEXTROSE 1-5 GM/200ML-% IV SOLN
1000.0000 mg | Freq: Two times a day (BID) | INTRAVENOUS | Status: AC
  Administered 2011-12-27 – 2011-12-28 (×2): 1000 mg via INTRAVENOUS
  Filled 2011-12-27 (×2): qty 200

## 2011-12-27 MED ORDER — SODIUM CHLORIDE 0.9 % IR SOLN
Status: DC | PRN
  Administered 2011-12-27: 09:00:00

## 2011-12-27 MED ORDER — ONDANSETRON HCL 4 MG/2ML IJ SOLN
4.0000 mg | Freq: Four times a day (QID) | INTRAMUSCULAR | Status: DC | PRN
  Filled 2011-12-27 (×3): qty 2

## 2011-12-27 MED ORDER — DIPHENHYDRAMINE HCL 50 MG/ML IJ SOLN: 12.5000 mg | Freq: Four times a day (QID) | INTRAMUSCULAR | Status: DC | PRN

## 2011-12-27 MED ORDER — SUFENTANIL CITRATE 50 MCG/ML IV SOLN
INTRAVENOUS | Status: DC | PRN
  Administered 2011-12-27 (×3): 20 ug via INTRAVENOUS
  Administered 2011-12-27: 10 ug via INTRAVENOUS
  Administered 2011-12-27: 20 ug via INTRAVENOUS
  Administered 2011-12-27: 10 ug via INTRAVENOUS

## 2011-12-27 SURGICAL SUPPLY — 61 items
CANISTER SUCTION 2500CC (MISCELLANEOUS) ×2 IMPLANT
CLIP TI MEDIUM 24 (CLIP) ×2 IMPLANT
CLIP TI WIDE RED SMALL 24 (CLIP) ×2 IMPLANT
CLOTH BEACON ORANGE TIMEOUT ST (SAFETY) ×2 IMPLANT
COVER MAYO STAND STRL (DRAPES) ×2 IMPLANT
COVER SURGICAL LIGHT HANDLE (MISCELLANEOUS) ×4 IMPLANT
DRAPE WARM FLUID 44X44 (DRAPE) ×2 IMPLANT
DRSG COVADERM 4X10 (GAUZE/BANDAGES/DRESSINGS) ×2 IMPLANT
DRSG COVADERM 4X14 (GAUZE/BANDAGES/DRESSINGS) ×2 IMPLANT
DRSG COVADERM 4X6 (GAUZE/BANDAGES/DRESSINGS) ×2 IMPLANT
ELECT BLADE 6.5 EXT (BLADE) IMPLANT
ELECT REM PT RETURN 9FT ADLT (ELECTROSURGICAL) ×4
ELECTRODE REM PT RTRN 9FT ADLT (ELECTROSURGICAL) ×2 IMPLANT
FELT TEFLON 4 X1 (Mesh General) ×2 IMPLANT
GLOVE BIOGEL PI IND STRL 6.5 (GLOVE) ×5 IMPLANT
GLOVE BIOGEL PI IND STRL 7.5 (GLOVE) ×2 IMPLANT
GLOVE BIOGEL PI INDICATOR 6.5 (GLOVE) ×5
GLOVE BIOGEL PI INDICATOR 7.5 (GLOVE) ×2
GLOVE ORTHOPEDIC STR SZ6.5 (GLOVE) ×2 IMPLANT
GLOVE SS BIOGEL STRL SZ 7 (GLOVE) ×3 IMPLANT
GLOVE SUPERSENSE BIOGEL SZ 7 (GLOVE) ×3
GLOVE SURG SS PI 6.0 STRL IVOR (GLOVE) ×2 IMPLANT
GLOVE SURG SS PI 7.5 STRL IVOR (GLOVE) ×10 IMPLANT
GOWN PREVENTION PLUS XLARGE (GOWN DISPOSABLE) ×4 IMPLANT
GOWN STRL NON-REIN LRG LVL3 (GOWN DISPOSABLE) ×10 IMPLANT
GRAFT HEMASHIELD 20X10 (Vascular Products) ×2 IMPLANT
INSERT FOGARTY 61MM (MISCELLANEOUS) ×2 IMPLANT
INSERT FOGARTY SM (MISCELLANEOUS) ×4 IMPLANT
KIT BASIN OR (CUSTOM PROCEDURE TRAY) ×2 IMPLANT
KIT ROOM TURNOVER OR (KITS) ×2 IMPLANT
LOOP VESSEL MAXI BLUE (MISCELLANEOUS) IMPLANT
LOOP VESSEL MINI RED (MISCELLANEOUS) ×2 IMPLANT
NS IRRIG 1000ML POUR BTL (IV SOLUTION) ×4 IMPLANT
PACK AORTA (CUSTOM PROCEDURE TRAY) ×2 IMPLANT
PAD ARMBOARD 7.5X6 YLW CONV (MISCELLANEOUS) ×4 IMPLANT
SPECIMEN JAR MEDIUM (MISCELLANEOUS) ×2 IMPLANT
SPONGE LAP 18X18 X RAY DECT (DISPOSABLE) ×10 IMPLANT
SPONGE LAP 4X18 X RAY DECT (DISPOSABLE) ×2 IMPLANT
STAPLER VISISTAT 35W (STAPLE) ×4 IMPLANT
SUT ETHIBOND 5 LR DA (SUTURE) IMPLANT
SUT PROLENE 1 XLH 60 (SUTURE) ×4 IMPLANT
SUT PROLENE 3 0 SH1 36 (SUTURE) ×2 IMPLANT
SUT PROLENE 4 0 RB 1 (SUTURE)
SUT PROLENE 4-0 RB1 .5 CRCL 36 (SUTURE) IMPLANT
SUT PROLENE 5 0 CC 1 (SUTURE) ×6 IMPLANT
SUT PROLENE 6 0 C 1 30 (SUTURE) ×2 IMPLANT
SUT SILK 1 TIES 10X30 (SUTURE) ×2 IMPLANT
SUT SILK 2 0 (SUTURE) ×1
SUT SILK 2 0 SH CR/8 (SUTURE) ×2 IMPLANT
SUT SILK 2-0 18XBRD TIE 12 (SUTURE) ×1 IMPLANT
SUT SILK 3 0 (SUTURE) ×1
SUT SILK 3 0 SH CR/8 (SUTURE) ×2 IMPLANT
SUT SILK 3-0 18XBRD TIE 12 (SUTURE) ×1 IMPLANT
SUT VIC AB 2-0 CTX 36 (SUTURE) ×2 IMPLANT
SUT VIC AB 3-0 MH 27 (SUTURE) ×8 IMPLANT
SUT VIC AB 3-0 SH 27 (SUTURE) ×1
SUT VIC AB 3-0 SH 27X BRD (SUTURE) ×1 IMPLANT
TOWEL OR 17X24 6PK STRL BLUE (TOWEL DISPOSABLE) ×4 IMPLANT
TOWEL OR 17X26 10 PK STRL BLUE (TOWEL DISPOSABLE) ×4 IMPLANT
TRAY FOLEY CATH 14FRSI W/METER (CATHETERS) ×2 IMPLANT
WATER STERILE IRR 1000ML POUR (IV SOLUTION) ×2 IMPLANT

## 2011-12-27 NOTE — Progress Notes (Signed)
Pt. Is still very weak. Unable to hold head up. Grips are weak. Anesthesia order to leave pt. On Vent and transfer to 2300.

## 2011-12-27 NOTE — H&P (View-Only) (Signed)
Subjective:     Patient ID: Paula [name], female   DOB: 01/23/1927, 78 y.o.   MRN: [id number]  HPI this 78-year-old female returns today for further discussion regarding her abdominal aortic aneurysm which I have been following. She had an evaluation by Dr. Jeff Katz who has cleared her from a cardiac standpoint. She has a normal ejection fraction. CT scan was performed today but could not be done with contrast because of the patient's history of a contrast reaction. I have reviewed the CT scan and the patient is not a stent graft candidate because of the diameter of the neck and the angulated neck. She will need open repair of this infrarenal aortic aneurysm.   Past Medical History  Diagnosis Date  . Diabetes mellitus   . COPD (chronic obstructive pulmonary disease)   . Hypertension   . Shortness of breath   . Hypothyroidism   . Depression   . Recurrent upper respiratory infection (URI)   . Seizures   . Ejection fraction     EF 40%, echo, November, 2012  / in improved, EF 60%, echo, December, 2012  . Abnormal EKG     November, 2012  . Contrast media allergy     Patient feels poorly with contrast  . AAA (abdominal aortic aneurysm)     10/2011 - 5.4cm - scheduled to f/u Dr. Livvy Spilman  . Pneumonia     10/2011  . Pre-syncope     vasovagal w/ heart block  . Parotid mass     has refused further eval 10/2011  . Chest pain at rest     Myoview 10/14/2011: ef 60%, ? dist anteroseptal scar w/ sublte peri-infarct ischemia vs. attenuation  . Hyperthyroidism   . CHF (congestive heart failure)   . GERD (gastroesophageal reflux disease)   . Headache   . Arthritis   . Anxiety   . Preop cardiovascular exam     Abdominal aortic aneurysm repair by Dr.Kinsler Soeder    History  Substance Use Topics  . Smoking status: Former Smoker -- 1.0 packs/day for 65 years    Types: Cigarettes    Quit date: 08/31/2011  . Smokeless tobacco: Never Used  . Alcohol Use: No    Family History  Problem Relation Age  of Onset  . Cancer Father     Allergies  Allergen Reactions  . Contrast Media (Iodinated Diagnostic Agents) Anaphylaxis  . Iohexol      Desc: PT STATES SHE HAD A SEVERE REACTION TO IV CONRAST WITH THROAT SWELLING AND SOB. SHE WAS ADMITTED TO THE HOSPITAL. SHE HAS NEVER HAD CONTRAST AGAIN.   . Penicillins Rash  . Sulfa Antibiotics Anaphylaxis    Current outpatient prescriptions:ALPRAZolam (XANAX) 0.5 MG tablet, Take 0.5-1 mg by mouth 2 (two) times daily. Takes 1 tab in the am & 2 tabs at night, Disp: , Rfl: ;  aspirin 81 MG tablet, Take 160 mg by mouth daily., Disp: , Rfl: ;  calcium carbonate (OS-CAL) 600 MG TABS, Take 600 mg by mouth daily., Disp: , Rfl: ;  Cholecalciferol (VITAMIN D3) 5000 UNITS CAPS, Take 1 capsule by mouth daily.  , Disp: , Rfl:  hydrochlorothiazide (HYDRODIURIL) 25 MG tablet, Take 25 mg by mouth daily., Disp: , Rfl: ;  levothyroxine (SYNTHROID, LEVOTHROID) 150 MCG tablet, Take 150 mcg by mouth every morning.  , Disp: , Rfl:   BP 147/101  Pulse 118  Resp 16  Ht 5' 5" (1.651 m)  Wt 160 lb (72.576 kg)  BMI   26.63 kg/m2  SpO2 96%  Body mass index is 26.63 kg/(m^2).         Review of Systems denies chest pain. Does have mild dyspnea on exertion from chronic tobacco abuse but quit smoking in October of 2012. Denies claudication symptoms or hemoptysis.    Objective:   Physical Exam blood pressure 147/101 heart rate 118 respirations 16 General elderly female no apparent distress alert and oriented x3 HEENT normal for age Chest no rhonchi or wheezing Cardiovascular regular rhythm no murmurs carotid pulses 3+ no audible bruits Abdomen soft with. Pulsatile mass in mid epigastric Lower extremities 3+ femoral and posterior tibial pulses bilaterally Neurologic normal Skin free of rashes Musculoskeletal 3 major deformities     Assessment:     Infrarenal abdominal aortic aneurysm-not candidate for aortic stent graft    Plan:     Plan resection and grafting  of abdominal aortic aneurysm on Friday, February 8 Ms. and benefits of been thoroughly discussed with patient and her husband and they would like to proceed      

## 2011-12-27 NOTE — Progress Notes (Signed)
ANTIBIOTIC Renal Adjustment  Pharmacy Consult for  Renal Adjusted Antibiotic Dosing   Allergies  Allergen Reactions  . Contrast Media (Iodinated Diagnostic Agents) Anaphylaxis  . Iohexol Shortness Of Breath and Swelling     Desc: PT STATES SHE HAD A SEVERE REACTION TO IV CONRAST WITH THROAT SWELLING AND SOB. SHE WAS ADMITTED TO THE HOSPITAL. SHE HAS NEVER HAD CONTRAST AGAIN.   Marland Kitchen Penicillins Rash  . Sulfa Antibiotics Anaphylaxis    Patient Measurements: Height: 5\' 5"  (165.1 cm) Weight: 158 lb 11.7 oz (72 kg) IBW/kg (Calculated) : 57   Vital Signs: Temp: 97.7 F (36.5 C) (02/15 1530) Temp src: Oral (02/15 0618) BP: 109/65 mmHg (02/15 1445) Pulse Rate: 76  (02/15 0618) I  Intake/Output from this shift: Total I/O In: 9650 [I.V.:6250; Blood:1900; IV Piggyback:1500] Out: 2750 [Urine:1150; Blood:1600]  Labs:  Basename 12/27/11 1300 12/27/11 0618  WBC 24.0* --  HGB 11.4* --  PLT 116* --  LABCREA -- --  CREATININE 0.63 0.82   Estimated Creatinine Clearance: 53.9 ml/min (by C-G formula based on Cr of 0.63).   Microbiology: Recent Results (from the past 720 hour(s))  SURGICAL PCR SCREEN     Status: Normal   Collection Time   12/18/11 11:49 AM      Component Value Range Status Comment   MRSA, PCR NEGATIVE  NEGATIVE  Final    Staphylococcus aureus NEGATIVE  NEGATIVE  Final     Medical History: Past Medical History  Diagnosis Date  . Diabetes mellitus   . COPD (chronic obstructive pulmonary disease)   . Hypertension   . Shortness of breath   . Hypothyroidism   . Depression   . Recurrent upper respiratory infection (URI)   . Seizures   . Ejection fraction     EF 40%, echo, November, 2012  / in improved, EF 60%, echo, December, 2012  . Abnormal EKG     November, 2012  . Contrast media allergy     Patient feels poorly with contrast  . AAA (abdominal aortic aneurysm)     10/2011 - 5.4cm - scheduled to f/u Dr. Hart Rochester  . Pneumonia     10/2011  . Pre-syncope       vasovagal w/ heart block  . Parotid mass     has refused further eval 10/2011  . Chest pain at rest     Myoview 10/14/2011: ef 60%, ? dist anteroseptal scar w/ sublte peri-infarct ischemia vs. attenuation  . Hyperthyroidism   . CHF (congestive heart failure)   . GERD (gastroesophageal reflux disease)   . Headache   . Arthritis   . Anxiety   . Preop cardiovascular exam     Abdominal aortic aneurysm repair by Dr.Lawson  . PONV (postoperative nausea and vomiting)   . Facial tic     L sided  . Macular degeneration     Medications:  Anti-infectives     Start     Dose/Rate Route Frequency Ordered Stop   12/27/11 2000   vancomycin (VANCOCIN) IVPB 1000 mg/200 mL premix        1,000 mg 200 mL/hr over 60 Minutes Intravenous Every 12 hours 12/27/11 1644 12/28/11 1959   12/27/11 1645   vancomycin (VANCOCIN) IVPB 1000 mg/200 mL premix  Status:  Discontinued        1,000 mg 200 mL/hr over 60 Minutes Intravenous Every 12 hours 12/27/11 1642 12/27/11 1644   12/19/11 1500   vancomycin (VANCOCIN) IVPB 1000 mg/200 mL premix  1,000 mg 200 mL/hr over 60 Minutes Intravenous  Once 12/19/11 1448 12/27/11 0805         Assessment: 78 yo admitted for AAA repair.  She was given IV Vancomycin 1000 mg preoperatively and two more doses have been ordered post-op.  She has normal "age" related renal function with an estimated clearance of 53 ml/min.  She currently has some leukocytosis with WBC = 24.0K  Goal of Therapy:  Renal appropriate antibiotic therapy.  Plan:  Since she will only be receiving two more doses of Vancomycin 1 gm, she will not require any adjustments.  If this is to be continued, we would need to adjust her dose downward to 750mg  IV q12 to avoid possible drug accumulation.  Nadara Mustard, PharmD., MS Clinical Pharmacist Pager:  [phone] 12/27/2011,4:54 PM

## 2011-12-27 NOTE — Op Note (Signed)
OPERATIVE REPORT  Date of Surgery: 12/27/2011  Surgeon: Josephina Gip, MD  Assistant: Clearence Ped Pre-op Diagnosis: peri-renal abdominal aortic aneurysm  Post-op Diagnosis: Same  Procedure: Procedure(s): Resection and grafting of peri-renal abdominal aortic aneurysm with insertion aorto right common iliac and left external iliac bypass using 2010 mm Hemashield Dacron graft Anesthesia: General  EBL: 800 cc  Complications: None  Procedure Details: The patient was taken to the operating room placed in the supine position at which time satisfactory general endotracheal anesthesia was administered. A radial arterial line and Swan-Ganz catheter were inserted by anesthesia. A nasogastric tube and Foley catheter were inserted in the OR. A midline incision was made from xiphoid to pubis carried down through subcutaneous tissue and into the linea all. There was a previous lower abdominal midline incision and there had been a mesh repair using parieteck mesh. The mesh extended up to just above the umbilicus. Peritoneal cavity was entered superiorly to the xiphoid and the incision extended distally until the mesh was encountered. There were dense adhesions between the greater omentum and the mesh. This was taken down sharply using the Metzenbaum scissors. There was no dense adhesions between loops of bowel and the mesh. Bowel is not injured. After the adhesions had been thoroughly mobilized peritoneal cavity was entered and thoroughly explored. Stomach duodenum small bowel and colon were unremarkable. The liver was smooth with no palpable masses. There were adhesions over the right lobe of the liver from previous cholecystectomy. The transverse colon was elevated and the intestines reflected to the patient's right side. The retroperitoneum was incised exposing a large abdominal aortic aneurysm which was bilobed and configuration both common iliac arteries were dissected free and encircled with umbilical  tapes. There was calcification and some mild dilatation on the left side. Therefore a retroperitoneal incision was made in the left pelvis the external iliac was exposed and a tunnel made posterior to the ureter on the left. The right common iliac was satisfactory for grafting. The patient had 5 renal arteries arising from the neck of the aneurysm. The neck of the aneurysm was large measuring 3-1/2 cm in diameter. All 5 renal arteries were identified and the aorta was circumferentially dissected free and to the superior mesenteric artery. This required division of the left renal vein for exposure. The adrenal and gonadal branches were preserved. There were 2 renal arteries on the left and 3 on the right one being quite small. All were preserved. Patient was given 25 g of mannitol and then heparinized. Aortic was occluded distal to the highest right renal artery but proximal to the other 4 renal arteries. The occlusion time was between 30 and 35 minutes. Aneurysm was opened anteriorly all renal arteries were temporarily occluded with vascular bulldog clamps. Neck of the aneurysm was transected just distal to the lowest renal artery at which point the aorta was dilated but not truly aneurysmal. Some lumbars were oversewn with 2-0 silk figure-of-eight sutures. A 20x10 Dacron graft was selected. The graft was anastomosed end-to-end to the aortic stump using continuous 3-0 Prolene buttressing this with a strip of felt. This was checked for leaks and none were present. The left limb was delivered and the colon and ureter to the left external iliac artery which was temporarily occluded with vascular clamps. The left common iliac artery had been ligated with an umbilical tape. Following this a longitudinal opening made in the external iliac artery on the left and into side anastomosis done with 5-0 Prolene. Left leg was  opened with no significant hypotension. Common iliac on the right was transected about 3 cm distal to its  origin it was a widely patent vessel. The right limb of the graft is anastomosed end-to-end to the right common iliac with 5-0 Prolene. Following declamping note significant hypotension persisted. 4 units of blood had been transfused for volume replacement. There was excellent backbleeding coming from the inferior mesenteric artery it was ligated at its origin protamine was then given to reverse the heparin. Adequate hemostasis was achieved. The aneurysm was closed over the graft 3-0 Vicryl. The retroperitoneum was approximated with 3-0 Vicryl. The mesh and midline incision was then reapproximated with 2 continuous #1 Prolene sutures. A long this the skin was closed with staples sterile dressing applied and patient taken to the recovery room in stable condition on the ventilator. Estimated blood loss 800 cc with 500 cc returned via cell saver and 4 units of packed red blood cells transfused there was excellent urinary output the patient was stable hemodynamically throughout the procedure.  Josephina Gip, MD 12/27/2011 12:49 PM

## 2011-12-27 NOTE — Anesthesia Postprocedure Evaluation (Signed)
Anesthesia Post Note  Patient: Paula [name]  Procedure(s) Performed: Procedure(s) (LRB): ANEURYSM ABDOMINAL AORTIC REPAIR (N/A)  Anesthesia type: general  Patient location: PACU  Post pain: Pain level controlled  Post assessment: Patient's Cardiovascular Status Stable  Last Vitals:  Filed Vitals:   12/27/11 1445  BP: 109/65  Pulse:   Temp:   Resp:     Post vital signs: Reviewed and stable  Level of consciousness: sedated  Complications: No apparent anesthesia complications

## 2011-12-27 NOTE — Anesthesia Procedure Notes (Signed)
Procedures 1610-960:  Procedures: Right IJ Theone Murdoch Catheter Insertion: The patient was identified and consent obtained.  TO was performed, and full barrier precautions were used.  The skin was anesthetized with lidocaine-4cc plain with 25g needle.  Once the vein was located with the 22 ga. needle using ultrasound guidance , the wire was inserted into the vein.  The wire location was confirmed with ultrasound.  The tissue was dilated and the 8.5 [place] cordis catheter was carefully inserted. Afterwards Theone Murdoch catheter was inserted. PA catheter at 47cm.  The patient tolerated the procedure well.   CE

## 2011-12-27 NOTE — Progress Notes (Signed)
B/P low. 500cc NS bolus initiated as ordered. Will continue to monitor.

## 2011-12-27 NOTE — Consult Note (Signed)
Referring physician: Josephina Gip Reason for consult: Ventilator management after AAA repair Date of consult: 12/27/2011   HISTORY of PRESENT ILLNESS:  Paula [name] is a 78 y.o. female former smoker admitted on 12/27/2011 for resection and grafting of peri-renal AAA.  Remained on vent post-op and PCCM consulted.   PMHx COPD, DM, HTN, Sz, Hypothyriodism, Diastolic heart failure, Parotid mass (refused evaluation)  Line/tubes: ETT 2/15>> Lt radial Aline 2/15>> Rt IJ PA cath 2/15>>  Abx: Vancomycin (post-op proph) 2/15>>  Tests/events  Subjective: Unable to obtain history as pt is intubated, sedated post-op.  History obtained from review of medical record.  Past Medical History  Diagnosis Date  . Diabetes mellitus   . COPD (chronic obstructive pulmonary disease)   . Hypertension   . Shortness of breath   . Hypothyroidism   . Depression   . Recurrent upper respiratory infection (URI)   . Seizures   . Ejection fraction     EF 40%, echo, November, 2012  / in improved, EF 60%, echo, December, 2012  . Abnormal EKG     November, 2012  . Contrast media allergy     Patient feels poorly with contrast  . AAA (abdominal aortic aneurysm)     10/2011 - 5.4cm - scheduled to f/u Dr. Hart [place]  . Pneumonia     10/2011  . Pre-syncope     vasovagal w/ heart block  . Parotid mass     has refused further eval 10/2011  . Chest pain at rest     Myoview 10/14/2011: ef 60%, ? dist anteroseptal scar w/ sublte peri-infarct ischemia vs. attenuation  . Hyperthyroidism   . CHF (congestive heart failure)   . GERD (gastroesophageal reflux disease)   . Headache   . Arthritis   . Anxiety   . Preop cardiovascular exam     Abdominal aortic aneurysm repair by Dr.Lawson  . PONV (postoperative nausea and vomiting)   . Facial tic     L sided  . Macular degeneration     Past Surgical History  Procedure Date  . Cholecystectomy   . Joint replacement   . Sinus surgery with instatrak   . Appendectomy    . Oophorectomy   . Arthroscopic repair acl   . Eye surgery   . Cardiac catheterization 11/04/11  . Bladder repair   . Hernia repair     abdominal  . US echocardiography 10/14/11    Family History  Problem Relation Age of Onset  . Cancer Father      reports that she quit smoking about 3 months ago. Her smoking use included Cigarettes. She has a 65 pack-year smoking history. She has never used smokeless tobacco. She reports that she does not drink alcohol or use illicit drugs.  Allergies  Allergen Reactions  . Contrast Media (Iodinated Diagnostic Agents) Anaphylaxis  . Iohexol Shortness Of Breath and Swelling     Desc: PT STATES SHE HAD A SEVERE REACTION TO IV CONRAST WITH THROAT SWELLING AND SOB. SHE WAS ADMITTED TO THE HOSPITAL. SHE HAS NEVER HAD CONTRAST AGAIN.   Marland Kitchen Penicillins Rash  . Sulfa Antibiotics Anaphylaxis    Medications Prior to Admission  Medication Dose Route Frequency Provider Last Rate Last Dose  . 0.9 %  sodium chloride infusion  500 mL Intravenous Once PRN Marlowe Shores, PA 999 mL/hr at 12/27/11 1500 500 mL at 12/27/11 1500  . chlorhexidine (PERIDEX) 0.12 % solution           .  dextrose 5 % and 0.9 % NaCl with KCl 20 mEq/L infusion   Intravenous Continuous Marlowe Shores, [place]      . DOPamine (INTROPIN) 3.2-5 MG/ML-% infusion           . DOPamine (INTROPIN) 800 mg in dextrose 5 % 250 mL infusion  3-5 mcg/kg/min Intravenous Continuous Marlowe Shores, [place]      . HYDROmorphone (DILAUDID) 1 MG/ML injection           . HYDROmorphone (DILAUDID) PCA injection 0.3 mg/mL   Intravenous Q4H Regina J Roczniak, [place]      . magnesium sulfate IVPB 2 g 50 mL  2 g Intravenous Once PRN Marlowe Shores, PA      . vancomycin (VANCOCIN) IVPB 1000 mg/200 mL premix  1,000 mg Intravenous Once Josephina Gip, MD   1,000 mg at 12/27/11 0805  . DISCONTD: 0.9 %  sodium chloride infusion   Intravenous Continuous Josephina Gip, MD      . DISCONTD: 0.9 % irrigation (POUR BTL)    PRN  Josephina Gip, MD   1,000 mL at 12/27/11 0820  . DISCONTD: heparin 6,000 Units in sodium chloride irrigation 0.9 % 500 mL irrigation    PRN Josephina Gip, MD      . DISCONTD: HYDROmorphone (DILAUDID) injection 0.25-0.5 mg  0.25-0.5 mg Intravenous Q5 min PRN Raiford Simmonds, MD   0.5 mg at 12/27/11 1405  . DISCONTD: ondansetron (ZOFRAN) injection 4 mg  4 mg Intravenous Q6H PRN Raiford Simmonds, MD       Medications Prior to Admission  Medication Sig Dispense Refill  . ALPRAZolam (XANAX) 0.5 MG tablet Take 0.5 mg by mouth 3 (three) times daily as needed. For anxiety      . hydrochlorothiazide (HYDRODIURIL) 25 MG tablet Take 25 mg by mouth daily.      Marland Kitchen levothyroxine (SYNTHROID, LEVOTHROID) 150 MCG tablet Take 150 mcg by mouth every morning.         ROS: Unable to obtain due to pt being intubated, sedated.  PHYICAL EXAM:  Blood pressure 109/65, pulse 76, temperature 97.7 F (36.5 C), temperature source Oral, resp. rate 16, height 5\' 5"  (1.651 m), weight 158 lb 11.7 oz (72 kg), SpO2 98.00%. Body mass index is 26.41 kg/(m^2).   Vent Mode:  [-] SIMV FiO2 (%):  [50 %] 50 % Set Rate:  [10 bmp] 10 bmp Vt Set:  [500 mL] 500 mL  General - no distress HEENT - ETT and Rt IJ in place Cardiac - s1s2 regular, no murmur Chest - scattered rhonchi, no wheeze Abd - soft, nontender GU - foley in place Ext - no edema Neuro - sedated  Lab Results  Component Value Date   WBC 24.0* 12/27/2011   HGB 11.4* 12/27/2011   HCT 34.2* 12/27/2011   MCV 92.7 12/27/2011   PLT 116* 12/27/2011  ,  Lab Results  Component Value Date   CREATININE 0.63 12/27/2011   BUN 9 12/27/2011   NA 138 12/27/2011   K 3.5 12/27/2011   CL 107 12/27/2011   CO2 22 12/27/2011  ,  Lab Results  Component Value Date   ALT 13 12/18/2011   AST 14 12/18/2011   ALKPHOS 72 12/18/2011   BILITOT 0.3 12/18/2011  ,  Lab Results  Component Value Date   CKTOTAL 28 11/04/2011   CKMB 1.9 11/04/2011   TROPONINI <0.30 11/04/2011   Lab Results    Component Value Date   DDIMER 0.71* 10/26/2011  ,  Lab Results  Component Value Date   ESRSEDRATE 10 12/06/2011  ,  Lab Results  Component Value Date   TSH 16.355* 10/25/2011    ABG    Component Value Date/Time   PHART 7.317* 12/27/2011 1257   HCO3 22.3 12/27/2011 1257   TCO2 23.7 12/27/2011 1257   ACIDBASEDEF 2.9* 12/27/2011 1257   O2SAT 99.1 12/27/2011 1257     Dg Chest Portable 1 View  12/27/2011  *RADIOLOGY REPORT*  Clinical Data: Line placement.  PORTABLE CHEST - 1 VIEW  Comparison: Plain film of the chest 12/18/2011.  Findings: The patient has a new endotracheal tube in place with tip in good position, 5 cm above the carina.  Tip of right IJ approach Swan-Ganz catheter is in the proximal descending right interlobar pulmonary artery.  NG tube courses into the stomach and below the inferior margin of the film.  There is no pneumothorax.  Lung volumes are low with some basilar atelectasis, worse on the left.  Calcified granuloma on the left and a calcified left hilar lymph nodes are noted.  IMPRESSION:  1.  Support apparatus as described.  No pneumothorax or 2.  Bibasilar subsegmental atelectasis.  Original Report Authenticated By: Bernadene Bell. D'ALESSIO, M.D.   ASSESSMENT/PLAN:   78 yo s/p resection/grafting of peri-renal AAA 2/15 and remained on vent post-op.  Acute respiratory failure -full vent support for now>>changed from SIMV to [facility] with 8 cc/kg Vt (460) -f/u CXR and ABG -pressure support wean in AM 2/16 and hopefully proceed with extubation  Hx of COPD -no evidence for bronchospasm -prn bronchodilators  AAA s/p resection/grafting -post-op care per VVS -PA cath per VVS  DM -SSI  Hx of HTN, Diastolic dysfx -followed by [facility] cardiology as outpt -resume ASA when okay with VVS -lopressor IV per VVS  Hypothyroidism -continue IV synthroid until able to take oral meds  Sedation/analgesia -PCA per VVS -intermittent sedation while on vent  Best practice -SCD  for DVT prophylaxis -Pepcid for SUP  -Full code  Updated husband at bedside  Paula [name] 12/27/2011, 4:37 PM Pager:  [phone]

## 2011-12-27 NOTE — Interval H&P Note (Signed)
History and Physical Interval Note:  12/27/2011 7:30 AM  Paula [name]  has presented today for surgery, with the diagnosis of AAA  The various methods of treatment have been discussed with the patient and family. After consideration of risks, benefits and other options for treatment, the patient has consented to  Procedure(s) (LRB): ANEURYSM ABDOMINAL AORTIC REPAIR (N/A) as a surgical intervention .  The patients' history has been reviewed, patient examined, no change in status, stable for surgery.  I have reviewed the patients' chart and labs.  Questions were answered to the patient's satisfaction.     Josephina Gip

## 2011-12-27 NOTE — Anesthesia Preprocedure Evaluation (Addendum)
Anesthesia Evaluation  Patient identified by MRN, date of birth, ID band Patient awake    Reviewed: Allergy & Precautions, H&P , NPO status , Patient's Chart, lab work & pertinent test results  History of Anesthesia Complications (+) PONV  Airway Mallampati: II  Neck ROM: full    Dental   Pulmonary shortness of breath, COPDRecent URI ,          Cardiovascular hypertension, +CHF     Neuro/Psych  Headaches, Seizures -,  PSYCHIATRIC DISORDERS Anxiety Depression    GI/Hepatic GERD-  ,  Endo/Other  Diabetes mellitus-Hypothyroidism Hyperthyroidism   Renal/GU      Musculoskeletal   Abdominal   Peds  Hematology   Anesthesia Other Findings   Reproductive/Obstetrics                           Anesthesia Physical Anesthesia Plan  ASA: IV  Anesthesia Plan: General   Post-op Pain Management:    Induction: Intravenous  Airway Management Planned: Oral ETT  Additional Equipment: Arterial line and CVP  Intra-op Plan:   Post-operative Plan: Extubation in OR  Informed Consent: I have reviewed the patients History and Physical, chart, labs and discussed the procedure including the risks, benefits and alternatives for the proposed anesthesia with the patient or authorized representative who has indicated his/her understanding and acceptance.     Plan Discussed with: CRNA and Surgeon  Anesthesia Plan Comments:         Anesthesia Quick Evaluation

## 2011-12-27 NOTE — Transfer of Care (Signed)
Immediate Anesthesia Transfer of Care Note  Patient: Paula [name]  Procedure(s) Performed: Procedure(s) (LRB): ANEURYSM ABDOMINAL AORTIC REPAIR (N/A)  Patient Location: PACU  Anesthesia Type: General  Level of Consciousness: sedated and Patient remains intubated per anesthesia plan  Airway & Oxygen Therapy: Patient remains intubated per anesthesia plan  Post-op Assessment: Report given to PACU RN and Post -op Vital signs reviewed and stable  Post vital signs: Reviewed and stable  Complications: No apparent anesthesia complications

## 2011-12-28 ENCOUNTER — Inpatient Hospital Stay (HOSPITAL_COMMUNITY): Payer: Medicare Other

## 2011-12-28 DIAGNOSIS — I714 Abdominal aortic aneurysm, without rupture: Secondary | ICD-10-CM

## 2011-12-28 DIAGNOSIS — J9600 Acute respiratory failure, unspecified whether with hypoxia or hypercapnia: Secondary | ICD-10-CM

## 2011-12-28 DIAGNOSIS — F05 Delirium due to known physiological condition: Secondary | ICD-10-CM

## 2011-12-28 DIAGNOSIS — Z9911 Dependence on respirator [ventilator] status: Secondary | ICD-10-CM

## 2011-12-28 LAB — CBC
HCT: 33.6 % — ABNORMAL LOW (ref 36.0–46.0)
MCH: 30.6 pg (ref 26.0–34.0)
MCV: 91.8 fL (ref 78.0–100.0)
Platelets: 190 10*3/uL (ref 150–400)
RDW: 18.4 % — ABNORMAL HIGH (ref 11.5–15.5)

## 2011-12-28 LAB — POCT I-STAT 3, ART BLOOD GAS (G3+)
Bicarbonate: 20.4 mEq/L (ref 20.0–24.0)
O2 Saturation: 98 %
TCO2: 21 mmol/L (ref 0–100)
pCO2 arterial: 36.8 mmHg (ref 35.0–45.0)
pO2, Arterial: 114 mmHg — ABNORMAL HIGH (ref 80.0–100.0)

## 2011-12-28 LAB — GLUCOSE, CAPILLARY
Glucose-Capillary: 217 mg/dL — ABNORMAL HIGH (ref 70–99)
Glucose-Capillary: 155 mg/dL — ABNORMAL HIGH (ref 70–99)
Glucose-Capillary: 123 mg/dL — ABNORMAL HIGH (ref 70–99)
Glucose-Capillary: 141 mg/dL — ABNORMAL HIGH (ref 70–99)

## 2011-12-28 LAB — COMPREHENSIVE METABOLIC PANEL
ALT: 19 U/L (ref 0–35)
Alkaline Phosphatase: 29 U/L — ABNORMAL LOW (ref 39–117)
Chloride: 110 mEq/L (ref 96–112)
GFR calc Af Amer: 75 mL/min — ABNORMAL LOW (ref >90)
Glucose, Bld: 202 mg/dL — ABNORMAL HIGH (ref 70–99)
Total Protein: 3.7 g/dL — ABNORMAL LOW (ref 6.0–8.3)

## 2011-12-28 MED ORDER — SODIUM CHLORIDE 0.9 % IV BOLUS (SEPSIS)
500.0000 mL | Freq: Once | INTRAVENOUS | Status: AC
  Administered 2011-12-28: 500 mL via INTRAVENOUS

## 2011-12-28 MED ORDER — SODIUM CHLORIDE 0.9 % IV BOLUS (SEPSIS): 500.0000 mL | Freq: Once | INTRAVENOUS | Status: DC

## 2011-12-28 MED ORDER — SODIUM CHLORIDE 0.45 % IV SOLN
INTRAVENOUS | Status: DC
  Administered 2011-12-28: 1000 mL via INTRAVENOUS
  Administered 2011-12-28: 05:00:00 via INTRAVENOUS

## 2011-12-28 MED ORDER — HYDROMORPHONE HCL PF 1 MG/ML IJ SOLN
0.5000 mg | Freq: Two times a day (BID) | INTRAMUSCULAR | Status: DC | PRN
  Administered 2011-12-28 (×2): 0.5 mg via INTRAVENOUS

## 2011-12-28 NOTE — Progress Notes (Signed)
[facility]  Subjective  - POD #s/p open AAA repair  Remained intubated overnight.  Decreased UOP since 4am.  15cc/hr for 2 hours   Physical Exam:  Awake and alert, remains intubated Abdomen soft with mild tenderness Palpable pedal pulses       Assessment/Plan:  POD #1  Pulm - will plan for extubation this morning GI:  Keep npo wit NGT to sxn Renal:  Decreased UOP with normal creatinine.  Suspect some degree of ATN given supra-renal clamp for repair.  PAD around 9.  Will repeat fluid bolus Prophylaxix:  SCDs in place  Leeam Cedrone IV, V. WELLS 12/28/2011 6:25 AM --  Filed Vitals:   12/28/11 0600  BP: 104/64  Pulse: 97  Temp: 99.7 F (37.6 C)  Resp: 24    Intake/Output Summary (Last 24 hours) at 12/28/11 0625 Last data filed at 12/28/11 0500  Gross per 24 hour  Intake  14782 ml  Output   3390 ml  Net   8360 ml     Laboratory CBC    Component Value Date/Time   WBC 25.2* 12/28/2011 0400   HGB 11.2* 12/28/2011 0400   HCT 33.6* 12/28/2011 0400   PLT 190 12/28/2011 0400    BMET    Component Value Date/Time   NA 138 12/28/2011 0400   K 4.4 12/28/2011 0400   CL 110 12/28/2011 0400   CO2 20 12/28/2011 0400   GLUCOSE 202* 12/28/2011 0400   BUN 15 12/28/2011 0400   CREATININE 0.82 12/28/2011 0400   CREATININE 0.80 12/23/2011 0824   CALCIUM 7.4* 12/28/2011 0400   GFRNONAA 65* 12/28/2011 0400   GFRAA 75* 12/28/2011 0400    COAG Lab Results  Component Value Date   INR 1.93* 12/27/2011   INR 1.08 12/18/2011   INR 1.22 11/04/2011   No results found for this basename: PTT    Antibiotics Anti-infectives     Start     Dose/Rate Route Frequency Ordered Stop   12/27/11 2000   vancomycin (VANCOCIN) IVPB 1000 mg/200 mL premix        1,000 mg 200 mL/hr over 60 Minutes Intravenous Every 12 hours 12/27/11 1644 12/28/11 1959   12/27/11 1645   vancomycin (VANCOCIN) IVPB 1000 mg/200 mL premix  Status:  Discontinued        1,000 mg 200 mL/hr  over 60 Minutes Intravenous Every 12 hours 12/27/11 1642 12/27/11 1644   12/19/11 1500   vancomycin (VANCOCIN) IVPB 1000 mg/200 mL premix        1,000 mg 200 mL/hr over 60 Minutes Intravenous  Once 12/19/11 1448 12/27/11 0805           V. Charlena Cross, M.D. [facility] Office: [phone] Pager:  [phone]

## 2011-12-28 NOTE — Evaluation (Signed)
Physical Therapy Evaluation Patient Details Name: Paula [name] MRN: [id number] DOB: 02/28/1927 Today's Date: 12/28/2011  Problem List:  Patient Active Problem List  Diagnoses  . COPD (chronic obstructive pulmonary disease) with emphysema  . Diabetes mellitus type II, controlled  . Aneurysm of abdominal aorta  . Parotid mass  . Hypothyroidism  . Sinus bradycardia  . Hypokalemia  . Delirium  . Congestive heart failure  . Ejection fraction  . Abnormal EKG  . Contrast media allergy  . Facial twitching  . Depression  . Anxiety  . Chest pain at rest  . Pre-syncope  . Preop cardiovascular exam  . Abdominal aneurysm without mention of rupture    Past Medical History:  Past Medical History  Diagnosis Date  . Diabetes mellitus   . COPD (chronic obstructive pulmonary disease)   . Hypertension   . Shortness of breath   . Hypothyroidism   . Depression   . Recurrent upper respiratory infection (URI)   . Seizures   . Ejection fraction     EF 40%, echo, November, 2012  / in improved, EF 60%, echo, December, 2012  . Abnormal EKG     November, 2012  . Contrast media allergy     Patient feels poorly with contrast  . AAA (abdominal aortic aneurysm)     10/2011 - 5.4cm - scheduled to f/u Dr. Hart [place]  . Pneumonia     10/2011  . Pre-syncope     vasovagal w/ heart block  . Parotid mass     has refused further eval 10/2011  . Chest pain at rest     Myoview 10/14/2011: ef 60%, ? dist anteroseptal scar w/ sublte peri-infarct ischemia vs. attenuation  . Hyperthyroidism   . CHF (congestive heart failure)   . GERD (gastroesophageal reflux disease)   . Headache   . Arthritis   . Anxiety   . Preop cardiovascular exam     Abdominal aortic aneurysm repair by Dr.Lawson  . PONV (postoperative nausea and vomiting)   . Facial tic     L sided  . Macular degeneration    Past Surgical History:  Past Surgical History  Procedure Date  . Cholecystectomy   . Joint replacement   . Sinus  surgery with instatrak   . Appendectomy   . Oophorectomy   . Arthroscopic repair acl   . Eye surgery   . Cardiac catheterization 11/04/11  . Bladder repair   . Hernia repair     abdominal  . US echocardiography 10/14/11    PT Assessment/Plan/Recommendation PT Assessment Clinical Impression Statement: pt is 78 yo female who underwent AAA repair and presents with increased lethargy today after recent extubation.  At this point, recommend CIR consult as pt will need extra time before being ready to d/c home with husband but is hoping to avoid SNF stay.  She has 24 hr assist at home.  PT will follow acutely. PT Recommendation/Assessment: Patient will need skilled PT in the acute care venue PT Problem List: Decreased strength;Decreased activity tolerance;Decreased balance;Decreased mobility;Decreased cognition;Decreased knowledge of use of DME;Pain Barriers to Discharge: None PT Therapy Diagnosis : Difficulty walking;Generalized weakness;Acute pain PT Plan PT Frequency: Min 3X/week PT Recommendation Recommendations for Other Services: OT consult;Rehab consult Follow Up Recommendations: Inpatient Rehab Equipment Recommended: Defer to next venue PT Goals  Acute Rehab PT Goals PT Goal Formulation: With patient/family Time For Goal Achievement: 2 weeks Pt will go Supine/Side to Sit: with min assist PT Goal: Supine/Side to Sit - Progress:  Goal set today Pt will go Sit to Supine/Side: with min assist PT Goal: Sit to Supine/Side - Progress: Goal set today Pt will go Sit to Stand: with min assist PT Goal: Sit to Stand - Progress: Goal set today Pt will go Stand to Sit: with min assist PT Goal: Stand to Sit - Progress: Goal set today Pt will Transfer Bed to Chair/Chair to Bed: with min assist PT Transfer Goal: Bed to Chair/Chair to Bed - Progress: Goal set today Pt will Ambulate: 51 - 150 feet;with min assist;with rolling walker PT Goal: Ambulate - Progress: Goal set today  PT  Evaluation Precautions/Restrictions  Precautions Precautions: Fall Required Braces or Orthoses: No Restrictions Weight Bearing Restrictions: No Prior Functioning  Home Living Lives With: Spouse Receives Help From: Family Type of Home: House Home Layout: One level Home Access: Stairs to enter Entrance Stairs-Rails: None Entrance Stairs-Number of Steps: 1 Home Adaptive Equipment: Walker - rolling;Straight cane Prior Function Level of Independence: Requires assistive device for independence;Needs assistance with ADLs;Needs assistance with homemaking Able to Take Stairs?: Yes Driving: No Vocation: Retired Comments: Pt has 1 step into her living room from kitchen.  She occasionally uses her cane in the house.  Pt's husband reports that he was helping her bathe but she was dressing herself until right before surgery.  Pt's husband drives to shop but pt goes and walks in the store.  Cognition Cognition Arousal/Alertness: Lethargic Overall Cognitive Status: History of cognitive impairments History of Cognitive Impairment: Appears at baseline functioning (but with added lethargy from pain meds) Orientation Level: Oriented to person;Oriented to place;Oriented to situation Sensation/Coordination Sensation Light Touch: Appears Intact Stereognosis: Not tested Hot/Cold: Not tested Proprioception: Impaired by gross assessment Coordination Gross Motor Movements are Fluid and Coordinated: No Fine Motor Movements are Fluid and Coordinated: Not tested Coordination and Movement Description: mvmts slow and with decreased positional awareness Extremity Assessment RUE Assessment RUE Assessment: Not tested LUE Assessment LUE Assessment: Not tested RLE Assessment RLE Assessment: Exceptions to [facility] RLE Strength RLE Overall Strength Comments: grossly 3/5 Bilateral LE's, impacted today by lethargy LLE Assessment LLE Assessment: Exceptions to [facility] LLE Strength LLE Overall Strength Comments: same  as RLE with functional assessment Mobility (including Balance) Bed Mobility Bed Mobility: No (pt just up in chair with nsg) Transfers Transfers: Yes Sit to Stand: 1: +2 Total assist;Patient percentage (comment);From chair/3-in-1;With upper extremity assist (pt 50%) Sit to Stand Details (indicate cue type and reason): Pt unable to extend knees fully in standing.  Manual facilitation to encourage knee and trunk extension.  Performed 2x. Stand to Sit: 1: +2 Total assist;Patient percentage (comment);To chair/3-in-1;With upper extremity assist (pt 50%) Stand to Sit Details: assist to control descent Ambulation/Gait Ambulation/Gait: No (pt unable) Stairs: No Wheelchair Mobility Wheelchair Mobility: No  Posture/Postural Control Posture/Postural Control: No significant limitations Balance Balance Assessed: Yes Static Standing Balance Static Standing - Balance Support: Bilateral upper extremity supported Static Standing - Level of Assistance: 1: +2 Total assist;Patient percentage (comment) (pt 50%) Static Standing - Comment/# of Minutes: 1 min, 2 trials     End of Session PT - End of Session Equipment Utilized During Treatment: Gait belt Activity Tolerance: Patient limited by fatigue Patient left: in chair;with call bell in reach;with family/visitor present Nurse Communication: Mobility status for transfers General Behavior During Session: Lethargic Cognition: Impaired (unclear of exact baseline level)  [place]reek [place], [place]  [phone] 12/28/2011, 3:13 PM

## 2011-12-28 NOTE — Procedures (Signed)
Extubation Procedure Note  Patient Details:   Name: Paula [name] DOB: 09-Jan-1927 MRN: [id number]   Airway Documentation:  AIRWAYS 8 mm (Active)      Pt was extubated to 4L [place] at 1030 per MD. Pt able to vocalize name. No stridor noted. Weak cough. Positive cuff leak.  Evaluation  O2 sats: stable throughout and currently acceptable Complications: No apparent complications Patient did tolerate procedure well. Bilateral Breath Sounds: Diminished;Clear Suctioning: Airway   Christie Beckers 12/28/2011, 10:44 AM

## 2011-12-28 NOTE — Progress Notes (Signed)
Referring physician: Josephina Gip Reason for consult: Ventilator management after AAA repair Date of consult: 12/28/2011   HISTORY of PRESENT ILLNESS:  Paula [name] is a 78 y.o. female former smoker admitted on 12/27/2011 for resection and grafting of peri-renal AAA.  Remained on vent post-op and PCCM consulted.   PMHx COPD, DM, HTN, Sz, Hypothyriodism, Diastolic heart failure, Parotid mass (refused evaluation)  Line/tubes: ETT 2/15>> 2/16 Lt radial Aline 2/15>> 2/16 Rt IJ PA cath 2/15>>  Abx: Vancomycin (post-op proph) 2/15>>  Tests/events    Subjective: Has passed SBT. RASS 0. + F/C  PHYICAL EXAM:  Blood pressure 140/86, pulse 104, temperature 99.7 F (37.6 C), temperature source Oral, resp. rate 17, height 5\' 5"  (1.651 m), weight 80 kg (176 lb 5.9 oz), SpO2 97.00%. Body mass index is 29.35 kg/(m^2).   Vent Mode:  [-] Stand-by FiO2 (%):  [30 %-40 %] 30 % Set Rate:  [10 bmp-14 bmp] 14 bmp Vt Set:  [460 mL-500 mL] 460 mL PEEP:  [5 cmH20] 5 cmH20 Pressure Support:  [5 cmH20-10 cmH20] 5 cmH20 Plateau Pressure:  [10 cmH20-20 cmH20] 20 cmH20  General - no distress HEENT - ETT and Rt IJ in place Cardiac - s1s2 regular, no murmur Chest - scattered rhonchi, no wheeze Abd - soft, nontender GU - foley in place Ext - no edema Neuro - RASS 0. + F/C.   Lab Results  Component Value Date   WBC 25.2* 12/28/2011   HGB 11.2* 12/28/2011   HCT 33.6* 12/28/2011   MCV 91.8 12/28/2011   PLT 190 12/28/2011  ,  Lab Results  Component Value Date   CREATININE 0.82 12/28/2011   BUN 15 12/28/2011   NA 138 12/28/2011   K 4.4 12/28/2011   CL 110 12/28/2011   CO2 20 12/28/2011  ,  Lab Results  Component Value Date   ALT 19 12/28/2011   AST 18 12/28/2011   ALKPHOS 29* 12/28/2011   BILITOT 0.4 12/28/2011   ABG    Component Value Date/Time   PHART 7.354 12/28/2011 0423   HCO3 20.4 12/28/2011 0423   TCO2 21 12/28/2011 0423   ACIDBASEDEF 4.0* 12/28/2011 0423   O2SAT 98.0 12/28/2011 0423       ASSESSMENT/PLAN:   78 yo s/p resection/grafting of peri-renal AAA 2/15 and remained on vent post-op.  Acute respiratory failure -Extubate today. Will leave in NGT post extubation  Hx of COPD -no evidence for bronchospasm -Cont prn bronchodilators  AAA s/p resection/grafting -post-op care per VVS -PA cath per VVS  DM -Cont SSI  Hx of HTN, Diastolic dysfx -followed by [facility] cardiology as outpt -resume ASA when okay with VVS -lopressor IV per VVS  Hypothyroidism -continue IV synthroid until able to take oral meds  Sedation/analgesia -PCA per VVS -intermittent sedation while on vent  Best practice -SCD for DVT prophylaxis -Pepcid for SUP  -Full code   Billy Fischer, MD;  PCCM service; Mobile [phone]

## 2011-12-29 LAB — URINE MICROSCOPIC-ADD ON

## 2011-12-29 LAB — URINALYSIS, ROUTINE W REFLEX MICROSCOPIC
Glucose, UA: NEGATIVE mg/dL
Leukocytes, UA: NEGATIVE
Nitrite: NEGATIVE
Specific Gravity, Urine: 1.027 (ref 1.005–1.030)
pH: 6 (ref 5.0–8.0)

## 2011-12-29 LAB — CBC
MCH: 31.2 pg (ref 26.0–34.0)
Platelets: 165 10*3/uL (ref 150–400)
RBC: 2.85 MIL/uL — ABNORMAL LOW (ref 3.87–5.11)
WBC: 29.5 10*3/uL — ABNORMAL HIGH (ref 4.0–10.5)

## 2011-12-29 LAB — BASIC METABOLIC PANEL
BUN: 17 mg/dL (ref 6–23)
Creatinine, Ser: 0.83 mg/dL (ref 0.50–1.10)
GFR calc Af Amer: 74 mL/min — ABNORMAL LOW (ref >90)
GFR calc non Af Amer: 64 mL/min — ABNORMAL LOW (ref >90)
Potassium: 4 mEq/L (ref 3.5–5.1)

## 2011-12-29 LAB — GLUCOSE, CAPILLARY
Glucose-Capillary: 138 mg/dL — ABNORMAL HIGH (ref 70–99)
Glucose-Capillary: 134 mg/dL — ABNORMAL HIGH (ref 70–99)
Glucose-Capillary: 136 mg/dL — ABNORMAL HIGH (ref 70–99)

## 2011-12-29 MED ORDER — DEXTROSE-NACL 5-0.9 % IV SOLN
INTRAVENOUS | Status: DC
  Administered 2011-12-29: 09:00:00 via INTRAVENOUS

## 2011-12-29 MED ORDER — SODIUM CHLORIDE 0.9 % IV BOLUS (SEPSIS): 500.0000 mL | Freq: Once | INTRAVENOUS | Status: DC

## 2011-12-29 MED ORDER — HYDROMORPHONE HCL PF 1 MG/ML IJ SOLN
0.5000 mg | INTRAMUSCULAR | Status: DC | PRN
  Administered 2011-12-29 (×3): 0.5 mg via INTRAVENOUS
  Filled 2011-12-29 (×2): qty 1

## 2011-12-29 MED ORDER — SODIUM CHLORIDE 0.9 % IV BOLUS (SEPSIS)
500.0000 mL | Freq: Once | INTRAVENOUS | Status: AC
  Administered 2011-12-29: 500 mL via INTRAVENOUS

## 2011-12-29 MED ORDER — HALOPERIDOL LACTATE 5 MG/ML IJ SOLN
5.0000 mg | Freq: Once | INTRAMUSCULAR | Status: AC
  Administered 2011-12-29: 5 mg via INTRAVENOUS
  Filled 2011-12-29: qty 1

## 2011-12-29 NOTE — Progress Notes (Signed)
Referring physician: Josephina Gip Reason for consult: Ventilator management after AAA repair Date of consult: 12/29/2011   HISTORY of PRESENT ILLNESS:  Paula [name] is a 78 y.o. female former smoker admitted on 12/27/2011 for resection and grafting of peri-renal AAA.  Remained on vent post-op and PCCM consulted.   PMHx COPD, DM, HTN, Sz, Hypothyriodism, Diastolic heart failure, Parotid mass (refused evaluation)  Line/tubes: ETT 2/15>> 2/16 Lt radial Aline 2/15>> 2/16 Rt IJ PA cath 2/15>> 2/16 R IJ sleeve 2/15 >>  Abx: Vancomycin (post-op proph) 2/15>> 2/16  Tests/events    Subjective: Has tolerated extubation well. No overt distress. C/O abd pain. RN reports mild confusion  PHYICAL EXAM:  Blood pressure 113/56, pulse 102, temperature 97.9 F (36.6 C), temperature source Oral, resp. rate 24, height 5\' 5"  (1.651 m), weight 80 kg (176 lb 5.9 oz), SpO2 96.00%. Body mass index is 29.35 kg/(m^2).      General - no distress HEENT - WNL Cardiac - s1s2 regular, no murmur Chest - scattered rhonchi, no wheeze Abd - soft, nontender, mildy tender diffusely GU - foley in place Ext - no edema Neuro - RASS 0. + F/C.   Lab Results  Component Value Date   WBC 29.5* 12/29/2011   HGB 8.9* 12/29/2011   HCT 26.4* 12/29/2011   MCV 92.6 12/29/2011   PLT 165 12/29/2011  ,  Lab Results  Component Value Date   CREATININE 0.83 12/29/2011   BUN 17 12/29/2011   NA 131* 12/29/2011   K 4.0 12/29/2011   CL 103 12/29/2011   CO2 23 12/29/2011  ,  Lab Results  Component Value Date   ALT 19 12/28/2011   AST 18 12/28/2011   ALKPHOS 29* 12/28/2011   BILITOT 0.4 12/28/2011       ASSESSMENT/PLAN:   78 yo s/p resection/grafting of peri-renal AAA 2/15 and remained on vent post-op.  Acute respiratory failure -Tolerating extubation. Cont usual post extubation care and monitoring  Hx of COPD -no evidence for bronchospasm -Cont prn bronchodilators  AAA s/p resection/grafting -post-op care per  VVS  DM -Cont SSI -Remains NPO  Hx of HTN, Diastolic dysfx -followed by [facility] cardiology as outpt -resume ASA when okay with VVS -Controlled on current Rx  Hypothyroidism -continue IV synthroid until able to take oral meds  Sedation/analgesia -Not using PCA effectively. PRN Dilaudid ordered -intermittent sedation while on vent  Best practice -SCD for DVT prophylaxis -Famotidine for SUP -Full code   Billy Fischer, MD;  PCCM service; Mobile [phone]

## 2011-12-29 NOTE — Progress Notes (Signed)
15cc dilaudid PCA wasted in garbage with Biagio Borg and Melissa Montane.

## 2011-12-29 NOTE — Progress Notes (Signed)
[facility] [place]  Subjective  - POD #2 s/p open AAA  No flatus this am C/o pain Marginal UOP Mildly confused   Physical Exam:  Abdomen soft with mild distention Incision ok Palpable pedal pulses       Assessment/Plan:  POD #2  Mild post operative dementia:  Will observe for now.  Pt not doing well with PCA so will change to prn dilaudid Decreased urine output:  Likely secondary to ATN given temporary suprarenal clamp.  Will leave foley in place to   monitor UOP.  Cr still pending No flatus as of yet, so will keep NG tube in place.    Ulmer Degen IV, VAnner Crete 12/29/2011 7:31 AM --  Filed Vitals:   12/29/11 0700  BP: 115/47  Pulse: 100  Temp:   Resp: 23    Intake/Output Summary (Last 24 hours) at 12/29/11 0731 Last data filed at 12/29/11 0700  Gross per 24 hour  Intake   2920 ml  Output   1190 ml  Net   1730 ml     Laboratory CBC    Component Value Date/Time   WBC 29.5* 12/29/2011 0400   HGB 8.9* 12/29/2011 0400   HCT 26.4* 12/29/2011 0400   PLT 165 12/29/2011 0400    BMET    Component Value Date/Time   NA 138 12/28/2011 0400   K 4.4 12/28/2011 0400   CL 110 12/28/2011 0400   CO2 20 12/28/2011 0400   GLUCOSE 202* 12/28/2011 0400   BUN 15 12/28/2011 0400   CREATININE 0.82 12/28/2011 0400   CREATININE 0.80 12/23/2011 0824   CALCIUM 7.4* 12/28/2011 0400   GFRNONAA 65* 12/28/2011 0400   GFRAA 75* 12/28/2011 0400    COAG Lab Results  Component Value Date   INR 1.93* 12/27/2011   INR 1.08 12/18/2011   INR 1.22 11/04/2011   No results found for this basename: PTT    Antibiotics Anti-infectives     Start     Dose/Rate Route Frequency Ordered Stop   12/27/11 2000   vancomycin (VANCOCIN) IVPB 1000 mg/200 mL premix        1,000 mg 200 mL/hr over 60 Minutes Intravenous Every 12 hours 12/27/11 1644 12/28/11 0804   12/27/11 1645   vancomycin (VANCOCIN) IVPB 1000 mg/200 mL premix  Status:  Discontinued        1,000 mg 200 mL/hr over 60  Minutes Intravenous Every 12 hours 12/27/11 1642 12/27/11 1644   12/19/11 1500   vancomycin (VANCOCIN) IVPB 1000 mg/200 mL premix        1,000 mg 200 mL/hr over 60 Minutes Intravenous  Once 12/19/11 1448 12/27/11 0805           V. Charlena Cross, M.D. [facility] [place] Office: [phone] Pager:  [phone]

## 2011-12-30 ENCOUNTER — Encounter (HOSPITAL_COMMUNITY): Payer: Self-pay | Admitting: Vascular Surgery

## 2011-12-30 DIAGNOSIS — I714 Abdominal aortic aneurysm, without rupture: Secondary | ICD-10-CM

## 2011-12-30 DIAGNOSIS — J9600 Acute respiratory failure, unspecified whether with hypoxia or hypercapnia: Secondary | ICD-10-CM

## 2011-12-30 LAB — BASIC METABOLIC PANEL
BUN: 13 mg/dL (ref 6–23)
Chloride: 105 mEq/L (ref 96–112)
GFR calc Af Amer: 89 mL/min — ABNORMAL LOW (ref >90)
Potassium: 3.8 mEq/L (ref 3.5–5.1)
Sodium: 135 mEq/L (ref 135–145)

## 2011-12-30 LAB — CBC
HCT: 23.4 % — ABNORMAL LOW (ref 36.0–46.0)
Hemoglobin: 7.9 g/dL — ABNORMAL LOW (ref 12.0–15.0)
MCHC: 33.8 g/dL (ref 30.0–36.0)
RBC: 2.51 MIL/uL — ABNORMAL LOW (ref 3.87–5.11)
WBC: 22.1 10*3/uL — ABNORMAL HIGH (ref 4.0–10.5)

## 2011-12-30 LAB — TYPE AND SCREEN: Antibody Screen: NEGATIVE

## 2011-12-30 LAB — HEMOGLOBIN AND HEMATOCRIT, BLOOD: HCT: 32.1 % — ABNORMAL LOW (ref 36.0–46.0)

## 2011-12-30 LAB — GLUCOSE, CAPILLARY
Glucose-Capillary: 120 mg/dL — ABNORMAL HIGH (ref 70–99)
Glucose-Capillary: 112 mg/dL — ABNORMAL HIGH (ref 70–99)

## 2011-12-30 MED ORDER — FENTANYL CITRATE 0.05 MG/ML IJ SOLN
12.5000 ug | INTRAMUSCULAR | Status: DC | PRN
  Administered 2011-12-30: 12.5 ug via INTRAVENOUS
  Filled 2011-12-30: qty 2

## 2011-12-30 MED ORDER — FENTANYL CITRATE 0.05 MG/ML IJ SOLN
50.0000 ug | INTRAMUSCULAR | Status: DC | PRN
  Administered 2011-12-30: 50 ug via INTRAVENOUS
  Administered 2011-12-31: 75 ug via INTRAVENOUS
  Filled 2011-12-30: qty 2

## 2011-12-30 MED ORDER — FUROSEMIDE 10 MG/ML IJ SOLN
20.0000 mg | Freq: Once | INTRAMUSCULAR | Status: DC
  Filled 2011-12-30: qty 2

## 2011-12-30 MED ORDER — FUROSEMIDE 10 MG/ML IJ SOLN
20.0000 mg | Freq: Once | INTRAMUSCULAR | Status: AC
  Administered 2011-12-30: 20 mg via INTRAVENOUS
  Filled 2011-12-30: qty 2

## 2011-12-30 NOTE — Progress Notes (Signed)
Attempted to draw blood from pt R IJ cordis for labs.  Bleeding from the bottom of dressing was noted and air was drawn back.  Took down dressing and held pressure for approx 10 minutes.  Bleeding stopped.  Removed cordis per protocol.  Catheter intact, no ectopy and pt tolerated well.  Pressure dressing with Vaseline gauze applied.

## 2011-12-30 NOTE — Progress Notes (Addendum)
[facility] [place]  Post-op  Intra-abdominal Surgery note  Date of Surgery: 12/27/2011 Surgeon: Surgeon(s): Josephina Gip, MD POD: 3 Days Post-Op Procedure(s): ANEURYSM ABDOMINAL AORTIC REPAIR  History of Present Illness  Paula [name] is a 78 y.o. female who is  up s/p  ANEURYSM ABDOMINAL AORTIC REPAIR Pt is doing well except for confusion, recognises husband, knows her age.denies incisional pain; denies nausea/vomiting;has had BM  IMAGING: No results found.  Significant Diagnostic Studies: CBC Lab Results  Component Value Date   WBC 22.1* 12/30/2011   HGB 7.9* 12/30/2011   HCT 23.4* 12/30/2011   MCV 93.2 12/30/2011   PLT 154 12/30/2011    BMET    Component Value Date/Time   NA 135 12/30/2011 0605   K 3.8 12/30/2011 0605   CL 105 12/30/2011 0605   CO2 23 12/30/2011 0605   GLUCOSE 132* 12/30/2011 0605   BUN 13 12/30/2011 0605   CREATININE 0.74 12/30/2011 0605   CREATININE 0.80 12/23/2011 0824   CALCIUM 8.6 12/30/2011 0605   GFRNONAA 77* 12/30/2011 0605   GFRAA 89* 12/30/2011 0605   COAG Lab Results  Component Value Date   INR 1.93* 12/27/2011   INR 1.08 12/18/2011   INR 1.22 11/04/2011   No results found for this basename: PTT    I/O last 3 completed shifts: In: 3110 [I.V.:2400; NG/GT:60; IV Piggyback:650] Out: 1425 [Urine:1225; Emesis/NG output:200]    Physical Examination BP Readings from Last 3 Encounters:  12/30/11 138/69  12/30/11 138/69  12/18/11 141/82   Temp Readings from Last 3 Encounters:  12/30/11 98.7 F (37.1 C) Oral  12/30/11 98.7 F (37.1 C) Oral  12/18/11 98.2 F (36.8 C)    SpO2 Readings from Last 3 Encounters:  12/30/11 97%  12/30/11 97%  12/18/11 96%   Pulse Readings from Last 3 Encounters:  12/30/11 104  12/30/11 104  12/18/11 81    General: A&O x 3, WDWN female in NAD Pulmonary: normal non-labored breathing , without Rales, rhonchi,  wheezing Cardiac: Heart rate : regular ,  Abdomen:abdomen mildly distended  and non-tender Abdominal wound:clean, dry, intact  Neurologic: A&O X 2; Appropriate Affect ; SENSATION: normal; MOTOR FUNCTION:  moving all extremities equally. Speech is fluent/normal Vascular Exam:BLE warm and well perfused Extremities without ischemic changes, no Gangrene , no cellulitis; no open wounds;   LOWER EXTREMITY PULSES           RIGHT                                      LEFT      POSTERIOR TIBIAL doppler palpable       DORSALIS PEDIS      ANTERIOR TIBIAL palpable palpable    Assessment/Plan:  Paula [name] is a 78 y.o. female who is 3 Days Post-Op Procedure(s): ANEURYSM ABDOMINAL AORTIC REPAIR 1. Still with moderate NGT output - 500cc/24hours 2. Mild confusion - not unusual for prolonged ICU stay - husband states she had confusion after last surgery 3. Post -op anemia - transfuse 2UPC will give lasix in between units 4. Prolonged ilieus with mod NGT drainage- ? DC NGT 5. U/O adequate - keep foley for strict I/O - pt anemic and requiring lasix and blood today 6. Repeat labs in am 7. Wbc 22K - U/A negative - may be atelectasis     Marlowe Shores [phone] 12/30/2011 7:43 AM  I have examined the patient, reviewed and agree with above.Stable progression.  WBC elevated preop as well.  Garo Heidelberg, MD 12/30/2011 12:25 PM

## 2011-12-30 NOTE — Progress Notes (Signed)
Referring physician: Josephina Gip Reason for consult: Ventilator management after AAA repair Date of consult: 12/30/2011  HISTORY of PRESENT ILLNESS: Paula [name] is a 78 y.o. female former smoker admitted on 12/27/2011 for resection and grafting of peri-renal AAA.  Remained on vent post-op and PCCM consulted.   PMHx COPD, DM, HTN, Sz, Hypothyriodism, Diastolic heart failure, Parotid mass (refused evaluation)  Line/tubes: ETT 2/15>> 2/16 Lt radial Aline 2/15>> 2/16 Rt IJ PA cath 2/15>> 2/16 R IJ sleeve 2/15 >>  Abx: Vancomycin (post-op proph) 2/15>> 2/16  Tests/events  Subjective: Has continued to have post-op confusion although she can be redirected. Calling out for family.  Receiving PRBC this am  PHYICAL EXAM:  Blood pressure 126/60, pulse 102, temperature 97.9 F (36.6 C), temperature source Oral, resp. rate 27, height 5\' 5"  (1.651 m), weight 82.2 kg (181 lb 3.5 oz), SpO2 98.00%. Body mass index is 30.16 kg/(m^2). General - no distress HEENT - WNL Cardiac - s1s2 regular, no murmur Chest - scattered rhonchi, no wheeze Abd - soft, nontender, mildy tender diffusely GU - foley in place Ext - no edema Neuro - RASS 0. + F/C.   Lab Results  Component Value Date   WBC 22.1* 12/30/2011   HGB 7.9* 12/30/2011   HCT 23.4* 12/30/2011   MCV 93.2 12/30/2011   PLT 154 12/30/2011  ,  Lab Results  Component Value Date   CREATININE 0.74 12/30/2011   BUN 13 12/30/2011   NA 135 12/30/2011   K 3.8 12/30/2011   CL 105 12/30/2011   CO2 23 12/30/2011  ,  Lab Results  Component Value Date   ALT 19 12/28/2011   AST 18 12/28/2011   ALKPHOS 29* 12/28/2011   BILITOT 0.4 12/28/2011   ASSESSMENT/PLAN:   78 yo s/p resection/grafting of peri-renal AAA 2/15 and remained on vent post-op, extubated 2/16.  Acute respiratory failure -Tolerating extubation. Cont usual post extubation care and monitoring  Hx of COPD -no evidence for bronchospasm -Cont prn bronchodilators; was not on home BD's based  on admission Med List  AAA s/p resection/grafting -post-op care per VVS -mobilizing w PT/OT  Ileus -? Remove NGT 2/18, trial PO's >> will defer to VVS  DM -Cont SSI -Remains NPO for now  Hx of HTN, Diastolic dysfx -followed by [facility] cardiology as outpt -resume ASA when okay with VVS -Controlled on current Rx  Hypothyroidism -continue IV synthroid until able to take oral meds  Sedation/analgesia -PRN Dilaudid ordered  Best practice -SCD for DVT prophylaxis -Famotidine for SUP -Full code  Levy Pupa, MD, PhD 12/30/2011, 9:23 AM [facility] [phone] or if no answer [phone]

## 2011-12-30 NOTE — Progress Notes (Signed)
Physical Therapy Treatment Patient Details Name: Paula [name] MRN: [id number] DOB: October 03, 1927 Today's Date: 12/30/2011  PT Assessment/Plan  PT - Assessment/Plan Comments on Treatment Session: [name] is moving better today but limited by her confusion. Husband reports this has happened before after another surgery.  PT Plan: Discharge plan remains appropriate;Frequency remains appropriate Recommendations for Other Services: Rehab consult Follow Up Recommendations: Inpatient Rehab Equipment Recommended: Defer to next venue PT Goals  Acute Rehab PT Goals PT Goal: Supine/Side to Sit - Progress: Progressing toward goal PT Goal: Sit to Supine/Side - Progress: Progressing toward goal PT Goal: Sit to Stand - Progress: Progressing toward goal PT Goal: Stand to Sit - Progress: Progressing toward goal PT Transfer Goal: Bed to Chair/Chair to Bed - Progress: Progressing toward goal PT Goal: Ambulate - Progress: Progressing toward goal  PT Treatment Precautions/Restrictions  Precautions Precautions: Fall Required Braces or Orthoses: No Restrictions Weight Bearing Restrictions: No Mobility (including Balance) Bed Mobility Bed Mobility: Yes Supine to Sit: 4: Min assist (observed OT with pt) Transfers Sit to Stand: 1: +2 Total assist;From bed;With upper extremity assist (60%) Sit to Stand Details (indicate cue type and reason): cues for safety as pt with increased confusion today/decreased attention; cues for safe hand placement and assist for follow through and to steady self in standing Stand to Sit: 1: +2 Total assist (60%) Stand to Sit Details: assist to control descent as well as to get hips farther back in chair (pt initially sitting on the edge of the chair with poor awareness for positioning); use of pad to totally assist pt posteriorly in chair Ambulation/Gait Ambulation/Gait: Yes Ambulation/Gait Assistance: 1: +2 Total assist (60%) Ambulation/Gait Assistance Details (indicate cue  type and reason): pt took 6 shuffled short steps anteriorly but refusing to go any further because she didn't feel well, was scared and painful in her belly; pt very distracted by a 'sewing maching' today Ambulation Distance (Feet):  (6 steps) Assistive device: Rolling walker  Static Standing Balance Static Standing - Balance Support: Bilateral upper extremity supported Static Standing - Level of Assistance: 1: +2 Total assist (60%) Exercise    End of Session PT - End of Session Equipment Utilized During Treatment: Gait belt Activity Tolerance: Patient limited by fatigue;Patient limited by pain (more confused today) Patient left: in chair;with call bell in reach;with restraints reapplied Nurse Communication: Mobility status for transfers;Mobility status for ambulation (lift pad under pt per RN request) General Behavior During Session: [name] for tasks performed Cognition: Impaired Cognitive Impairment: more confused today; oriented to self but not to time/place/situation; thought she had just had a baby at the age of 66; kept associating the number 80 with everything (this is her age); sustained attention; focused on a 'sewing machine cover' today  [facility] Paula [name] 12/30/2011, 3:11 PM

## 2011-12-30 NOTE — Progress Notes (Signed)
PT Cancellation Note  Treatment cancelled today due to RN request hold this am secondary to pt receiving blood. Will f/u later today as time allows. Humberto Seals [name] 12/30/2011, 8:43 AM

## 2011-12-30 NOTE — Clinical Documentation Improvement (Signed)
CHANGE MENTAL STATUS DOCUMENTATION CLARIFICATION   THIS DOCUMENT IS NOT A PERMANENT PART OF THE MEDICAL RECORD  TO RESPOND TO THE THIS QUERY, FOLLOW THE INSTRUCTIONS BELOW:  1. If needed, update documentation for the patient's encounter via the notes activity.  2. Access this query again and click edit on the In [employer].  3. After updating, or not, click F2 to complete all highlighted (required) fields concerning your review. Select "additional documentation in the medical record" OR "no additional documentation provided".  4. Click Sign note button.  5. The deficiency will fall out of your In Basket *Please let us know if you are not able to complete this workflow by phone or e-mail (listed below).         12/30/11  Dear Dr. Hart Rochester Paula [name]  In an effort to better capture your patient's severity of illness, reflect appropriate length of stay and utilization of resources, a review of the patient medical record has revealed the following indicators.    Based on your clinical judgment, please clarify and document in a progress note and/or discharge summary the clinical condition associated with the following supporting information:   Possible Clinical Conditions?  _______Encephalopathy (describe type if known)                       Anoxic                       Septic                       Alcoholic                        Hepatic                       Hypertensive                       Metabolic                       Toxic  _______Drug induced confusion/delirium _______Acute confusion _______Acute delirium _______Acute exacerbation of known dementia (indicate type) _______New diagnosis of Dementia, Alzheimer's, cerebral atherosclerosis _______Hypoxemia / Hypoxia _______Other Condition__________________ _______Cannot Clinically Determine   Supporting Information:  Signs & Symptoms: Noted confusion, mild post-op dementia per progress notes.   Reviewed:   Additional  documentation noted per progress notes.      Thank You,  Marciano Sequin,  Clinical Documentation Specialist:  Pager: [phone]  Health Information Management 

## 2011-12-30 NOTE — Clinical Documentation Improvement (Signed)
Anemia Blood Loss Clarification  THIS DOCUMENT IS NOT A PERMANENT PART OF THE MEDICAL RECORD  RESPOND TO THE THIS QUERY, FOLLOW THE INSTRUCTIONS BELOW:  1. If needed, update documentation for the patient's encounter via the notes activity.  2. Access this query again and click edit on the In [employer].  3. After updating, or not, click F2 to complete all highlighted (required) fields concerning your review. Select "additional documentation in the medical record" OR "no additional documentation provided".  4. Click Sign note button.  5. The deficiency will fall out of your In Basket *Please let us know if you are not able to complete this workflow by phone or e-mail (listed below).        12/30/11  Dear Beverely Low Marton Redwood  In an effort to better capture your patient's severity of illness, reflect appropriate length of stay and utilization of resources, a review of the patient medical record has revealed the following indicators.    Based on your clinical judgment, please clarify and document in a progress note and/or discharge summary the clinical condition associated with the following supporting information:   Possible Clinical Conditions?   " Expected Acute Blood Loss Anemia  " Acute Blood Loss Anemia  " Precipitous drop in Hematocrit  " Other Condition________________  " Cannot Clinically Determine    Supporting Information:  Signs and Symptoms:    Post-op anemia noted per 2/18 progress notes. EBL:800cc per Anesthesia record.  Diagnostics: H&H on 2/15: 7.1/21.0  Transfusion:   2/18: 2 units PRBC's  IV fluids / plasma expanders:  2/15: Albumin human x2; hetastarch 6% in lactated electrolytes(Hextend)  Reviewed:   No additional documentation noted.  Thank You,  Marciano Sequin,  Clinical Documentation Specialist:  Pager: [phone]  Health Information Management [place]

## 2011-12-30 NOTE — Progress Notes (Signed)
eLink Physician-Brief Progress Note Patient Name: Paula [name] DOB: 12-Jun-1927 MRN: [id number]  Date of Service  12/30/2011   HPI/Events of Note   Pt increasingly agitated, non responsive to haldol. Pt c/o pain. Only getting 12.5 mcg fentanyl prn iv  eICU Interventions  Pt not well controlled with analgesia, see orders for increased fentanyl IV   Intervention Category Major Interventions: Delirium, psychosis, severe agitation - evaluation and management Intermediate Interventions: Pain - evaluation and management  Shan Levans 12/30/2011, 9:59 PM

## 2011-12-30 NOTE — Progress Notes (Signed)
Pt restless, confused.  Oriented to person, disoriented X3.  Trying to get out of bed. States she is hurting.  MD made aware.  Orders for increase dose of fentanyl given.  Safety sitter in room.  Comfort measures, repositioning, distraction used to calm pt.  Will continue to monitor.

## 2011-12-31 ENCOUNTER — Inpatient Hospital Stay (HOSPITAL_COMMUNITY): Payer: Medicare Other

## 2011-12-31 LAB — TYPE AND SCREEN
Unit division: 0
Unit division: 0
Unit division: 0
Unit division: 0

## 2011-12-31 LAB — GLUCOSE, CAPILLARY
Glucose-Capillary: 116 mg/dL — ABNORMAL HIGH (ref 70–99)
Glucose-Capillary: 108 mg/dL — ABNORMAL HIGH (ref 70–99)
Glucose-Capillary: 116 mg/dL — ABNORMAL HIGH (ref 70–99)

## 2011-12-31 LAB — CBC
MCH: 31 pg (ref 26.0–34.0)
MCV: 91.1 fL (ref 78.0–100.0)
Platelets: 204 10*3/uL (ref 150–400)
RDW: 17.5 % — ABNORMAL HIGH (ref 11.5–15.5)

## 2011-12-31 LAB — BASIC METABOLIC PANEL
BUN: 16 mg/dL (ref 6–23)
CO2: 25 mEq/L (ref 19–32)
Calcium: 9 mg/dL (ref 8.4–10.5)
Creatinine, Ser: 0.78 mg/dL (ref 0.50–1.10)
GFR calc Af Amer: 88 mL/min — ABNORMAL LOW (ref >90)

## 2011-12-31 MED ORDER — POTASSIUM CHLORIDE CRYS ER 20 MEQ PO TBCR
20.0000 meq | EXTENDED_RELEASE_TABLET | Freq: Once | ORAL | Status: AC | PRN
  Administered 2011-12-31: 20 meq via ORAL

## 2011-12-31 MED ORDER — POTASSIUM CHLORIDE 10 MEQ/100ML IV SOLN
INTRAVENOUS | Status: AC
  Administered 2011-12-31: 10 meq
  Filled 2011-12-31: qty 200

## 2011-12-31 MED ORDER — BISACODYL 10 MG RE SUPP
10.0000 mg | Freq: Every day | RECTAL | Status: DC | PRN
  Administered 2011-12-31: 10 mg via RECTAL
  Filled 2011-12-31: qty 1

## 2011-12-31 MED FILL — Sodium Chloride Irrigation Soln 0.9%: Qty: 3000 | Status: AC

## 2011-12-31 MED FILL — Electrolyte-R (PH 7.4) Solution: INTRAVENOUS | Qty: 1000 | Status: AC

## 2011-12-31 MED FILL — Heparin Sodium (Porcine) Inj 1000 Unit/ML: INTRAMUSCULAR | Qty: 30 | Status: AC

## 2011-12-31 NOTE — Progress Notes (Signed)
Utilization review completed. Glinda Natzke, RN, BSN. 12/31/11 

## 2011-12-31 NOTE — Progress Notes (Signed)
Physical Therapy Treatment Patient Details Name: Paula [name] MRN: [id number] DOB: 08-13-1927 Today's Date: 12/31/2011  PT Assessment/Plan  PT - Assessment/Plan Comments on Treatment Session: Again moving better. Able to walk today but a lot of redirection. Pt definitely not safe to be at home with husband at this point because of cognition. Will need either SNF vs CIR pending progress.  PT Plan: Discharge plan needs to be updated;Frequency remains appropriate Follow Up Recommendations: Skilled nursing facility vs Inpatient Rehab (If not CIR pt will need SNF) Equipment Recommended: Defer to next venue PT Goals  Acute Rehab PT Goals PT Goal: Supine/Side to Sit - Progress: Progressing toward goal PT Goal: Sit to Stand - Progress: Progressing toward goal PT Goal: Stand to Sit - Progress: Progressing toward goal PT Transfer Goal: Bed to Chair/Chair to Bed - Progress: Progressing toward goal PT Goal: Ambulate - Progress: Progressing toward goal  PT Treatment Precautions/Restrictions  Precautions Precautions: Fall Precaution Comments: confused Required Braces or Orthoses: No Restrictions Weight Bearing Restrictions: No Mobility (including Balance) Bed Mobility Bed Mobility: Yes Supine to Sit: 1: +2 Total assist (50%) Supine to Sit Details (indicate cue type and reason): pt initiating but because of confusion and difficulty staying on task pt needing faciliation for trunk elevation Sitting - Scoot to Edge of Bed: 3: Mod assist Sitting - Scoot to Edge of Bed Details (indicate cue type and reason): use of pad to scoot right hip anteriorly Transfers Sit to Stand: 1: +2 Total assist;From bed (60%) Sit to Stand Details (indicate cue type and reason): facilitation for follow through; assist to steady RW as pt pulls on it to stand Stand to Sit: 3: Mod assist;To chair/3-in-1 Stand to Sit Details: assist to control descent Ambulation/Gait Ambulation/Gait Assistance: 1: +2 Total assist  (70%) Ambulation/Gait Assistance Details (indicate cue type and reason): amb. approx 60 ft with bilateral HHA; mostly limited by attention to task and following commands (was interested in finding her 'panties'); continuous redirection; short shuffled steps; lateral LOB with head turns; flexed trunk; first 10 ft pt used RW but with poor ability to use safely taking hands off throughout so once switched to 2-person hand held pt did much better Ambulation Distance (Feet): 60 Feet Assistive device: 2 person hand held assist  Static Standing Balance Static Standing - Balance Support: Bilateral upper extremity supported Static Standing - Level of Assistance: 1: +2 Total assist (75%) Exercise  Total Joint Exercises Heel Slides: AAROM;10 reps;Supine;Both Long Arc Quad: AROM;5 reps;Both;Seated End of Session PT - End of Session Equipment Utilized During Treatment: Gait belt Activity Tolerance: Patient tolerated treatment well Patient left: in chair;with restraints reapplied (sitter present) General Behavior During Session:  (easily agitated) Cognition: Impaired Cognitive Impairment: sustained attention (10-20 seconds for task)   [facility] [name] 12/31/2011, 11:33 AM

## 2011-12-31 NOTE — Progress Notes (Signed)
Referring physician: Josephina Gip Reason for consult: Ventilator management after AAA repair Date of consult: 12/31/2011  HISTORY of PRESENT ILLNESS: Paula [name] is a 78 y.o. female former smoker admitted on 12/27/2011 for resection and grafting of peri-renal AAA.  Remained on vent post-op and PCCM consulted.   PMHx COPD, DM, HTN, Sz, Hypothyriodism, Diastolic heart failure, Parotid mass (refused evaluation)  Line/tubes: ETT 2/15>> 2/16 Lt radial Aline 2/15>> 2/16 Rt IJ PA cath 2/15>> 2/16 R IJ sleeve 2/15 >>  Abx: Vancomycin (post-op proph) 2/15>> 2/16  Tests/events  Subjective: Some delirium o/n 2/18-19, received haldol without effect Sunday night, responded to fentanyl so ? Pain related Improved MS this am No BM reported  - ducolax ordered by SGY  PHYICAL EXAM:  Blood pressure 140/78, pulse 89, temperature 98.8 F (37.1 C), temperature source Oral, resp. rate 26, height 5\' 5"  (1.651 m), weight 78 kg (171 lb 15.3 oz), SpO2 98.00%. Body mass index is 28.62 kg/(m^2). General - no distress HEENT - WNL Cardiac - s1s2 regular, no murmur Chest - scattered rhonchi, no wheeze Abd - soft, nontender, mildy tender diffusely GU - foley in place Ext - no edema Neuro - confused, reorients  Lab Results  Component Value Date   WBC 18.6* 12/31/2011   HGB 10.1* 12/31/2011   HCT 29.7* 12/31/2011   MCV 91.1 12/31/2011   PLT 204 12/31/2011  ,  Lab Results  Component Value Date   CREATININE 0.78 12/31/2011   BUN 16 12/31/2011   NA 143 12/31/2011   K 3.5 12/31/2011   CL 110 12/31/2011   CO2 25 12/31/2011  ,  Lab Results  Component Value Date   ALT 19 12/28/2011   AST 18 12/28/2011   ALKPHOS 29* 12/28/2011   BILITOT 0.4 12/28/2011   ASSESSMENT/PLAN:   78 yo s/p resection/grafting of peri-renal AAA 2/15 and remained on vent post-op, extubated 2/16.  Acute respiratory failure -Tolerating extubation. Cont usual post extubation care and monitoring  Hx of COPD -no evidence for  bronchospasm -Cont prn bronchodilators; was not on home BD's based on admission Med List  AAA s/p resection/grafting -post-op care per VVS -mobilize w PT/OT  Ileus -? Remove NGT 2/19, still w high output - KUB pending this am - ? Start ice chips/clears  DM -Cont SSI -Remains NPO for now  Hx of HTN, Diastolic dysfx -followed by [facility] cardiology as outpt -resume ASA when okay with VVS -Controlled on current Rx  Hypothyroidism -continue IV synthroid until able to take oral meds  Sedation/analgesia -PRN fentanyl ordered  Best practice -SCD for DVT prophylaxis -Famotidine for SUP -Full code  Levy Pupa, MD, PhD 12/31/2011, 9:06 AM [facility] [phone] or if no answer [phone]

## 2011-12-31 NOTE — Evaluation (Signed)
Occupational Therapy Evaluation Patient Details Name: Paula [name] MRN: [id number] DOB: [birth year] Today's Date: 12/31/2011  Problem List:  Patient Active Problem List  Diagnoses  . COPD (chronic obstructive pulmonary disease) with emphysema  . Diabetes mellitus type II, controlled  . Aneurysm of abdominal aorta  . Parotid mass  . Hypothyroidism  . Sinus bradycardia  . Hypokalemia  . Delirium  . Congestive heart failure  . Ejection fraction  . Abnormal EKG  . Contrast media allergy  . Facial twitching  . Depression  . Anxiety  . Chest pain at rest  . Pre-syncope  . Preop cardiovascular exam  . Abdominal aneurysm without mention of rupture    Past Medical History:  Past Medical History  Diagnosis Date  . Diabetes mellitus   . COPD (chronic obstructive pulmonary disease)   . Hypertension   . Shortness of breath   . Hypothyroidism   . Depression   . Recurrent upper respiratory infection (URI)   . Seizures   . Ejection fraction     EF 40%, echo, November, 2012  / in improved, EF 60%, echo, December, 2012  . Abnormal EKG     November, 2012  . Contrast media allergy     Patient feels poorly with contrast  . AAA (abdominal aortic aneurysm)     10/2011 - 5.4cm - scheduled to f/u Dr. Hart [place]  . Pneumonia     10/2011  . Pre-syncope     vasovagal w/ heart block  . Parotid mass     has refused further eval 10/2011  . Chest pain at rest     Myoview 10/14/2011: ef 60%, ? dist anteroseptal scar w/ sublte peri-infarct ischemia vs. attenuation  . Hyperthyroidism   . CHF (congestive heart failure)   . GERD (gastroesophageal reflux disease)   . Headache   . Arthritis   . Anxiety   . Preop cardiovascular exam     Abdominal aortic aneurysm repair by Dr.Lawson  . PONV (postoperative nausea and vomiting)   . Facial tic     L sided  . Macular degeneration    Past Surgical History:  Past Surgical History  Procedure Date  . Cholecystectomy   . Joint replacement   . Sinus  surgery with instatrak   . Appendectomy   . Oophorectomy   . Arthroscopic repair acl   . Eye surgery   . Cardiac catheterization 11/04/11  . Bladder repair   . Hernia repair     abdominal  . US echocardiography 10/14/11  . Abdominal aortic aneurysm repair 12/27/2011    Procedure: ANEURYSM ABDOMINAL AORTIC REPAIR;  Surgeon: Josephina Gip, MD;  Location: [facility];  Service: Vascular;  Laterality: N/A;  Resection and Grafting Abdominal Aortic Aneurysm , Aorta Bi Iliac.    OT Assessment/Plan/Recommendation OT Assessment Clinical Impression Statement: Pt is 78 yo female who underwent AAA repair and presents with weakness and AMS today after recent extubation.  At this point, recommend CIR consult as pt will need extra time before being ready to d/c home with husband but is hoping to avoid SNF stay.  She has 24 hr assist at home. Will benefit from skilled OT in the acute setting followed by CIR stay to maximize I with ADL and ADL mobility prior to d/c home OT Recommendation/Assessment: Patient will need skilled OT in the acute care venue OT Problem List: Decreased strength;Decreased activity tolerance;Impaired balance (sitting and/or standing);Decreased cognition;Decreased safety awareness;Decreased knowledge of use of DME or AE;Decreased knowledge of precautions OT  Therapy Diagnosis : Generalized weakness;Hemiplegia non-dominant side;Altered mental status OT Plan OT Frequency: Min 2X/week OT Treatment/Interventions: Self-care/ADL training;Therapeutic exercise;DME and/or AE instruction;Therapeutic activities;Balance training;Patient/family education OT Recommendation Follow Up Recommendations: Inpatient Rehab Equipment Recommended: Defer to next venue Individuals Consulted Consulted and Agree with Results and Recommendations: Patient OT Goals Acute Rehab OT Goals OT Goal Formulation: With patient Time For Goal Achievement: 2 weeks ADL Goals Pt Will Perform Grooming: with min assist;Standing at  sink ADL Goal: Grooming - Progress: Goal set today Pt Will Perform Upper Body Dressing: with supervision;with set-up;Sitting, bed ADL Goal: Upper Body Dressing - Progress: Goal set today Pt Will Perform Lower Body Dressing: with mod assist;Sit to stand from chair ADL Goal: Lower Body Dressing - Progress: Goal set today Pt Will Transfer to Toilet: with mod assist;Ambulation;3-in-1 ADL Goal: Toilet Transfer - Progress: Goal set today Pt Will Perform Toileting - Hygiene: with min assist;Sit to stand from 3-in-1/toilet ADL Goal: Toileting - Hygiene - Progress: Goal set today  OT Evaluation Precautions/Restrictions  Precautions Precautions: Fall Precaution Comments: confused Required Braces or Orthoses: No Restrictions Weight Bearing Restrictions: No Prior Functioning Home Living Lives With: Spouse Receives Help From: Family Type of Home: House Home Layout: One level Home Access: Stairs to enter Entrance Stairs-Rails: None Entrance Stairs-Number of Steps: 1 Bathroom Shower/Tub: Engineer, manufacturing systems: Standard Home Adaptive Equipment: Environmental consultant - rolling;Straight cane Additional Comments: difficult to assess secondary to cognition Prior Function Level of Independence: Requires assistive device for independence;Needs assistance with ADLs;Needs assistance with homemaking Driving: No Vocation: Retired ADL ADL Eating/Feeding: Simulated;Moderate assistance Eating/Feeding Details (indicate cue type and reason): assist to attend to task Where Assessed - Eating/Feeding: Chair Grooming: Performed;Wash/dry face;Moderate assistance Grooming Details (indicate cue type and reason): assist to attend to task Where Assessed - Grooming: Sitting, chair Upper Body Bathing: Simulated;Moderate assistance Upper Body Bathing Details (indicate cue type and reason): assist to attend to task Where Assessed - Upper Body Bathing: Sitting, chair Lower Body Bathing: Simulated;Maximal  assistance Where Assessed - Lower Body Bathing: Sit to stand from chair Upper Body Dressing: Not assessed Lower Body Dressing: Not assessed Toilet Transfer: Simulated;+2 Total assistance (pt=60%) Toilet Transfer Method: Ambulating Where Assessed - Toileting Clothing Manipulation: Not assessed Toileting - Hygiene: Not assessed Tub/Shower Transfer: Not assessed Equipment Used: Rolling walker Ambulation Related to ADLs: +2total A(pt=60%) with RW ambulation ~ 23ft. Pt refused to go further, stating "I just can't!" "I need to sit"  ADL Comments: Pt. AMS also contributing to limited ambulation/participation with therapy as pt perseverating on sewing machines Vision/Perception  Vision - History Patient Visual Report: No change from baseline Cognition Cognition Overall Cognitive Status: History of cognitive impairments Orientation Level: Disoriented to place;Disoriented to time;Disoriented to situation Cognition - Other Comments: Pt with AMS that is not baseline, per husband.  Sensation/Coordination Sensation Light Touch: Appears Intact Extremity Assessment RUE Assessment RUE Assessment: Within Functional Limits LUE Assessment LUE Assessment: Within Functional Limits Mobility  Bed Mobility Bed Mobility: Yes Supine to Sit: 3: Mod assist;HOB elevated (Comment degrees);With rails (30) Supine to Sit Details (indicate cue type and reason): pt initiating but because of confusion and difficulty staying on task pt needing faciliation for trunk elevation Sitting - Scoot to Edge of Bed: 3: Mod assist Sitting - Scoot to Edge of Bed Details (indicate cue type and reason): use of pad Transfers Sit to Stand: 1: +2 Total assist;From bed (pt=60%) Sit to Stand Details (indicate cue type and reason): cues for safety as pt with increased confusion today/decreased attention;  cues for safe hand placement and assist for follow through and to steady self in standing Stand to Sit: 3: Mod assist;To  chair/3-in-1 Stand to Sit Details: assist to control descent  End of Session OT - End of Session: pt with no c/o pain this session Equipment Utilized During Treatment: Gait belt Activity Tolerance: Treatment limited secondary to agitation Leeroy Bock) Patient left: in chair;with call bell in reach Nurse Communication: Mobility status for transfers;Mobility status for ambulation General Behavior During Session:  (easily agitated) Cognition: Impaired Cognitive Impairment: sustained attention (10-20 seconds for task)    Raelene Trew 12/31/2011, 2:30 PM

## 2011-12-31 NOTE — Progress Notes (Addendum)
[facility] [place]  Post-op  Intra-abdominal Surgery note  Date of Surgery: 12/27/2011 Surgeon: Surgeon(s): Josephina Gip, MD POD: 4 Days Post-Op ANEURYSM ABDOMINAL AORTIC REPAIR  History of Present Illness  Paula [name] is a 78 y.o. female who is  up s/p  ANEURYSM ABDOMINAL AORTIC REPAIR Pt is doing well. denies incisional pain; denies nausea/vomiting; denies diarrhea. has not had flatus;has not had BM  IMAGING: No results found.  Significant Diagnostic Studies: CBC Lab Results  Component Value Date   WBC 18.6* 12/31/2011   HGB 10.1* 12/31/2011   HCT 29.7* 12/31/2011   MCV 91.1 12/31/2011   PLT 204 12/31/2011    BMET    Component Value Date/Time   NA 143 12/31/2011 0355   K 3.5 12/31/2011 0355   CL 110 12/31/2011 0355   CO2 25 12/31/2011 0355   GLUCOSE 116* 12/31/2011 0355   BUN 16 12/31/2011 0355   CREATININE 0.78 12/31/2011 0355   CREATININE 0.80 12/23/2011 0824   CALCIUM 9.0 12/31/2011 0355   GFRNONAA 76* 12/31/2011 0355   GFRAA 88* 12/31/2011 0355   COAG Lab Results  Component Value Date   INR 1.93* 12/27/2011   INR 1.08 12/18/2011   INR 1.22 11/04/2011   No results found for this basename: PTT    I/O last 3 completed shifts: In: 2862 [I.V.:1775; Blood:725; NG/GT:210; IV Piggyback:152] Out: 3215 [Urine:2315; Emesis/NG output:900]    Physical Examination BP Readings from Last 3 Encounters:  12/31/11 137/68  12/31/11 137/68  12/18/11 141/82   Temp Readings from Last 3 Encounters:  12/31/11 98.8 F (37.1 C) Oral  12/31/11 98.8 F (37.1 C) Oral  12/18/11 98.2 F (36.8 C)    SpO2 Readings from Last 3 Encounters:  12/31/11 97%  12/31/11 97%  12/18/11 96%   Pulse Readings from Last 3 Encounters:  12/31/11 103  12/31/11 103  12/18/11 81    General: A&O x 3, WDWN female in NAD Pulmonary: normal non-labored breathing , without Rales, rhonchi,  wheezing Cardiac: Heart rate : regular ,  Abdomen:abdomen soft and non-tender ;  quiet Abdominal wound:clean, dry, intact  Neurologic:awake and disoriented; SENSATION: normal; MOTOR FUNCTION:  moving all extremities equally. Speech is fluent/normal  Vascular Exam:BLE warm and well perfused Extremities without ischemic changes, no Gangrene , no cellulitis; no open wounds;   Assessment/Plan:  Paula [name] is a 78 y.o. female who is 4 Days Post-Op Procedure(s): ANEURYSM ABDOMINAL AORTIC REPAIR Prolonged ilieus-dulcolax supp today Post-op anemia improved with blood transfusion NGT out put still high - 450cc/12 hours - will get KUB today May have ice chips     Marlowe Shores [phone] 12/31/2011 7:58 AM      Agree with above.  NG tube drainage decreased this afternoon.  Will cont LIWS tx [address]

## 2011-12-31 NOTE — Progress Notes (Signed)
Pt. Received to room 3306 from 2315 in her bed. Pt. Is max assist for transfer to our bed. Pt. Settled and reoriented to the unit. Sitter is bedside. Wrist restraints in place.

## 2011-12-31 NOTE — Plan of Care (Signed)
Problem: Phase II Progression Outcomes Goal: Progress activity as tolerated unless otherwise ordered Outcome: Progressing Ambulated 60 ft today with physical therapy (needs bilateral hand held assist for stability and constant redirection to task secondary to confusion and decreased attention).

## 2012-01-01 ENCOUNTER — Inpatient Hospital Stay (HOSPITAL_COMMUNITY): Payer: Medicare Other

## 2012-01-01 LAB — GLUCOSE, CAPILLARY: Glucose-Capillary: 120 mg/dL — ABNORMAL HIGH (ref 70–99)

## 2012-01-01 LAB — CBC
HCT: 28.5 % — ABNORMAL LOW (ref 36.0–46.0)
Hemoglobin: 9.4 g/dL — ABNORMAL LOW (ref 12.0–15.0)
MCH: 30.5 pg (ref 26.0–34.0)
MCHC: 33 g/dL (ref 30.0–36.0)
RDW: 17.5 % — ABNORMAL HIGH (ref 11.5–15.5)

## 2012-01-01 LAB — BASIC METABOLIC PANEL
BUN: 19 mg/dL (ref 6–23)
Creatinine, Ser: 0.8 mg/dL (ref 0.50–1.10)
GFR calc non Af Amer: 67 mL/min — ABNORMAL LOW (ref >90)
Glucose, Bld: 122 mg/dL — ABNORMAL HIGH (ref 70–99)
Potassium: 3.3 mEq/L — ABNORMAL LOW (ref 3.5–5.1)

## 2012-01-01 MED ORDER — POTASSIUM CHLORIDE 10 MEQ/100ML IV SOLN
INTRAVENOUS | Status: AC
  Administered 2012-01-01 (×3): 10 meq
  Administered 2012-01-01: 10 meq via INTRAVENOUS
  Filled 2012-01-01: qty 100

## 2012-01-01 MED ORDER — FUROSEMIDE 20 MG PO TABS
20.0000 mg | ORAL_TABLET | Freq: Once | ORAL | Status: DC
  Filled 2012-01-01: qty 1

## 2012-01-01 MED ORDER — FUROSEMIDE 10 MG/ML IJ SOLN
20.0000 mg | Freq: Once | INTRAMUSCULAR | Status: AC
  Administered 2012-01-01: 20 mg via INTRAVENOUS
  Filled 2012-01-01: qty 2

## 2012-01-01 MED ORDER — ALBUTEROL SULFATE (5 MG/ML) 0.5% IN NEBU
2.5000 mg | INHALATION_SOLUTION | Freq: Four times a day (QID) | RESPIRATORY_TRACT | Status: DC | PRN
  Administered 2012-01-01 (×2): 2.5 mg via RESPIRATORY_TRACT
  Filled 2012-01-01 (×2): qty 0.5

## 2012-01-01 MED ORDER — POTASSIUM CHLORIDE CRYS ER 20 MEQ PO TBCR: 20.0000 meq | EXTENDED_RELEASE_TABLET | Freq: Once | ORAL | Status: AC | PRN

## 2012-01-01 NOTE — Progress Notes (Signed)
Physical Therapy Treatment Patient Details Name: Paula [name] MRN: [id number] DOB: 03/17/1927 Today's Date: 01/01/2012  PT Assessment/Plan  PT - Assessment/Plan Comments on Treatment Session: pt presents s/p AAA repair.  pt ambulating more today, but still confused.   PT Plan: Discharge plan needs to be updated;Frequency remains appropriate PT Frequency: Min 3X/week Recommendations for Other Services: Rehab consult Follow Up Recommendations: Inpatient Rehab Equipment Recommended: Defer to next venue PT Goals  Acute Rehab PT Goals PT Goal: Supine/Side to Sit - Progress: Progressing toward goal PT Goal: Sit to Stand - Progress: Progressing toward goal PT Goal: Stand to Sit - Progress: Progressing toward goal PT Transfer Goal: Bed to Chair/Chair to Bed - Progress: Progressing toward goal PT Goal: Ambulate - Progress: Progressing toward goal  PT Treatment Precautions/Restrictions  Precautions Precautions: Fall Precaution Comments: confused Required Braces or Orthoses: No Restrictions Weight Bearing Restrictions: No Mobility (including Balance) Bed Mobility Bed Mobility: Yes Rolling Right: 1: +2 Total assist;Patient percentage (comment) (pt 70%) Rolling Right Details (indicate cue type and reason): cues for sequencing, encouragement Right Sidelying to Sit: 1: +2 Total assist;Patient percentage (comment) (pt 40%) Right Sidelying to Sit Details (indicate cue type and reason): cues for sequencing, use of UEs, encouragement Transfers Transfers: Yes Sit to Stand: 1: +2 Total assist;Patient percentage (comment);From bed (pt 50%) Sit to Stand Details (indicate cue type and reason): pt with difficulty following cues and needed staff to initiate.   Stand to Sit: 1: +2 Total assist;Patient percentage (comment);To chair/3-in-1 (pt 50%) Stand to Sit Details: cues for encouragement, use of UEs, control descent Ambulation/Gait Ambulation/Gait: Yes Ambulation/Gait Assistance: 1: +2 Total  assist;Patient percentage (comment) (pt 60%) Ambulation/Gait Assistance Details (indicate cue type and reason): pt requires +2 A secondary to need for A with balance, movement of RW, upright posture, attention to task as pt easily distracted Ambulation Distance (Feet): 80 Feet Assistive device: Rolling walker Gait Pattern: Step-through pattern;Decreased stride length;Shuffle;Trunk flexed Stairs: No Wheelchair Mobility Wheelchair Mobility: No  Posture/Postural Control Posture/Postural Control: No significant limitations Balance Balance Assessed: No Exercise    End of Session PT - End of Session Equipment Utilized During Treatment: Gait belt Activity Tolerance: Patient tolerated treatment well Patient left: in chair;with call bell in reach;with restraints reapplied Psychiatrist present) Nurse Communication: Mobility status for transfers;Mobility status for ambulation General Behavior During Session: [facility] for tasks performed Cognition: Impaired Cognitive Impairment: pt with sustained attention for ~30-60 sec.  pt only oriented to self only and needs +2 A for safety and attention  [name], [place] [phone] 01/01/2012, 3:36 PM

## 2012-01-01 NOTE — Progress Notes (Addendum)
[facility] [place]  Post-op  Intra-abdominal Surgery note  Date of Surgery: 12/27/2011 Surgeon: Surgeon(s): Josephina Gip, MD POD: 5 Days Post-Op Procedure(s): ANEURYSM ABDOMINAL AORTIC REPAIR  History of Present Illness  Paula [name] is a 78 y.o. female who is  up s/p Procedure(s): ANEURYSM ABDOMINAL AORTIC REPAIR Pt is doing well. denies incisional pain; denies nausea/vomiting; denies diarrhea. has not had flatus;has had BM  IMAGING: Dg Chest Port 1 View  01/01/2012  *RADIOLOGY REPORT*  Clinical Data: .Wheezing.  An NG tube evaluation.  PORTABLE CHEST - 1 VIEW  Comparison: Chest x-ray 12/28/2011.  Findings: Nasogastric tube is seen extending into the stomach, however the tip of the tube extends below the lower margin of the image.  Compared to the prior examination, the patient is now extubated.  Lung volumes are low.  There continue to be bibasilar opacities (left greater than right), favored to represent areas of atelectasis.  Superimposed small bilateral pleural effusions (left greater than right) are noted.  Minimal pulmonary venous congestion, without evidence of edema.  Heart size remains mildly enlarged (unchanged). The patient is rotated to the right on today's exam, resulting in distortion of the mediastinal contours and reduced diagnostic sensitivity and specificity for mediastinal pathology.  Atherosclerotic calcifications in the arch of the aorta.  IMPRESSION: 1.  Support apparatus, as above. 2.  Low lung volumes with bibasilar subsegmental atelectasis and small bilateral pleural effusions. 3.  Atherosclerosis.  Original Report Authenticated By: Florencia Reasons, M.D.   Dg Abd Portable 1v  12/31/2011  *RADIOLOGY REPORT*  Clinical Data: Ileus, rule out bowel obstruction  PORTABLE ABDOMEN - 1 VIEW  Comparison: None.  Findings: There is nonspecific nonobstructive bowel gas pattern. Post cholecystectomy surgical clips are noted.  There is paucity of bowel gas in  the left abdomen.  Midline skin staples are noted within abdomen and pelvis. Degenerative changes lower lumbar spine.  IMPRESSION: Nonspecific nonobstructive bowel gas pattern.  Postsurgical changes are noted.  Paucity of bowel gas in the left abdomen.  NG tube in place.  Original Report Authenticated By: Natasha Mead, M.D.    Significant Diagnostic Studies: CBC Lab Results  Component Value Date   WBC 13.5* 01/01/2012   HGB 9.4* 01/01/2012   HCT 28.5* 01/01/2012   MCV 92.5 01/01/2012   PLT 246 01/01/2012    BMET    Component Value Date/Time   NA 147* 01/01/2012 0340   K 3.3* 01/01/2012 0340   CL 114* 01/01/2012 0340   CO2 25 01/01/2012 0340   GLUCOSE 122* 01/01/2012 0340   BUN 19 01/01/2012 0340   CREATININE 0.80 01/01/2012 0340   CREATININE 0.80 12/23/2011 0824   CALCIUM 9.0 01/01/2012 0340   GFRNONAA 67* 01/01/2012 0340   GFRAA 77* 01/01/2012 0340   COAG Lab Results  Component Value Date   INR 1.93* 12/27/2011   INR 1.08 12/18/2011   INR 1.22 11/04/2011   No results found for this basename: PTT    I/O last 3 completed shifts: In: 2489.2 [I.V.:1849.2; NG/GT:490; IV Piggyback:150] Out: 1920 [Urine:1120; Emesis/NG output:800]    Physical Examination BP Readings from Last 3 Encounters:  01/01/12 136/98  01/01/12 136/98  12/18/11 141/82   Temp Readings from Last 3 Encounters:  01/01/12 99 F (37.2 C) Axillary  01/01/12 99 F (37.2 C) Axillary  12/18/11 98.2 F (36.8 C)    SpO2 Readings from Last 3 Encounters:  01/01/12 95%  01/01/12 95%  12/18/11 96%   Pulse Readings from Last  3 Encounters:  01/01/12 78  01/01/12 78  12/18/11 81    General: A&O x 3, WDWN female in NAD Pulmonary: normal non-labored breathing , without Rales, rhonchi,  wheezing Cardiac: Heart rate : regular   Abdomen:abdomen soft and non-tender; still fairly quiet  Abdominal wound:clean, dry, intact  Neurologic: Awake less agitated; Appropriate Affect ; SENSATION: normal; MOTOR FUNCTION:  moving all  extremities equally. Speech is fluent/normal Vascular Exam:BLE warm and well perfused  LOWER EXTREMITY PULSES           RIGHT                                      LEFT      POSTERIOR TIBIAL palpable palpable    Assessment/Plan:  Paula [name] is a 78 y.o. female who is 5 Days Post-Op  ANEURYSM ABDOMINAL AORTIC REPAIR Pt calmer today, husband states more like herself. Had SOB and some CP last pm Hypokalemia - replace K+ Pt takes daily diuretic at home - will give lasix today ilieus slowly resolving - small BM yesterday after suppository PT/OT - ambulate  Marlowe Shores [phone] 01/01/2012 7:48 AM   Agree with the above Keep NG in place, NPO, Needs to be up walking ?possible rehab  Durene Cal

## 2012-01-01 NOTE — Progress Notes (Signed)
PCCM Brief Follow up  Pt appears to be better oriented, improving. Please call us if we can help you in any way.   Levy Pupa, MD, PhD 01/01/2012, 12:08 PM [facility] [phone] or if no answer [phone]

## 2012-01-02 LAB — BASIC METABOLIC PANEL
CO2: 26 mEq/L (ref 19–32)
Calcium: 9.2 mg/dL (ref 8.4–10.5)
Chloride: 117 mEq/L — ABNORMAL HIGH (ref 96–112)
GFR calc Af Amer: 76 mL/min — ABNORMAL LOW (ref >90)
Sodium: 151 mEq/L — ABNORMAL HIGH (ref 135–145)

## 2012-01-02 LAB — CBC
MCV: 93.6 fL (ref 78.0–100.0)
Platelets: 276 10*3/uL (ref 150–400)
RBC: 3.14 MIL/uL — ABNORMAL LOW (ref 3.87–5.11)
RDW: 17.7 % — ABNORMAL HIGH (ref 11.5–15.5)
WBC: 14.9 10*3/uL — ABNORMAL HIGH (ref 4.0–10.5)

## 2012-01-02 LAB — GLUCOSE, CAPILLARY
Glucose-Capillary: 106 mg/dL — ABNORMAL HIGH (ref 70–99)
Glucose-Capillary: 110 mg/dL — ABNORMAL HIGH (ref 70–99)
Glucose-Capillary: 119 mg/dL — ABNORMAL HIGH (ref 70–99)
Glucose-Capillary: 131 mg/dL — ABNORMAL HIGH (ref 70–99)
Glucose-Capillary: 134 mg/dL — ABNORMAL HIGH (ref 70–99)
Glucose-Capillary: 138 mg/dL — ABNORMAL HIGH (ref 70–99)

## 2012-01-02 LAB — GASTRIC OCCULT BLOOD (1-CARD TO LAB): Occult Blood, Gastric: POSITIVE — AB

## 2012-01-02 MED ORDER — SUCRALFATE 1 GM/10ML PO SUSP
1.0000 g | Freq: Four times a day (QID) | ORAL | Status: DC
  Administered 2012-01-02 – 2012-01-07 (×18): 1 g
  Filled 2012-01-02 (×24): qty 10

## 2012-01-02 MED ORDER — POTASSIUM CHLORIDE 10 MEQ/100ML IV SOLN
10.0000 meq | INTRAVENOUS | Status: AC
  Administered 2012-01-02 (×4): 10 meq via INTRAVENOUS
  Filled 2012-01-02: qty 100

## 2012-01-02 MED ORDER — FUROSEMIDE 10 MG/ML IJ SOLN
20.0000 mg | Freq: Once | INTRAMUSCULAR | Status: AC
  Administered 2012-01-02: 20 mg via INTRAVENOUS
  Filled 2012-01-02: qty 2

## 2012-01-02 MED ORDER — KCL IN DEXTROSE-NACL 20-5-0.9 MEQ/L-%-% IV SOLN
INTRAVENOUS | Status: DC
  Administered 2012-01-02 – 2012-01-03 (×2): via INTRAVENOUS
  Filled 2012-01-02 (×4): qty 1000

## 2012-01-02 NOTE — Progress Notes (Addendum)
[facility] [place]  Post-op  Intra-abdominal Surgery note  Date of Surgery: 12/27/2011 Surgeon: Surgeon(s): Josephina Gip, MD POD: 6 Days Post-Op ANEURYSM ABDOMINAL AORTIC REPAIR  History of Present Illness  Paula [name] is a 78 y.o. female who is  up s/p Procedure(s): ANEURYSM ABDOMINAL AORTIC REPAIR Pt is doing well. denies incisional pain;  has had minimal flatus;has not had BM since 2 days ago. NGT still with 900cc/24h Pt hungry today No nausea  IMAGING: Dg Chest Port 1 View  01/01/2012  *RADIOLOGY REPORT*  Clinical Data: .Wheezing.  An NG tube evaluation.  PORTABLE CHEST - 1 VIEW  Comparison: Chest x-ray 12/28/2011.  Findings: Nasogastric tube is seen extending into the stomach, however the tip of the tube extends below the lower margin of the image.  Compared to the prior examination, the patient is now extubated.  Lung volumes are low.  There continue to be bibasilar opacities (left greater than right), favored to represent areas of atelectasis.  Superimposed small bilateral pleural effusions (left greater than right) are noted.  Minimal pulmonary venous congestion, without evidence of edema.  Heart size remains mildly enlarged (unchanged). The patient is rotated to the right on today's exam, resulting in distortion of the mediastinal contours and reduced diagnostic sensitivity and specificity for mediastinal pathology.  Atherosclerotic calcifications in the arch of the aorta.  IMPRESSION: 1.  Support apparatus, as above. 2.  Low lung volumes with bibasilar subsegmental atelectasis and small bilateral pleural effusions. 3.  Atherosclerosis.  Original Report Authenticated By: Florencia Reasons, M.D.   Dg Abd Portable 1v  12/31/2011  *RADIOLOGY REPORT*  Clinical Data: Ileus, rule out bowel obstruction  PORTABLE ABDOMEN - 1 VIEW  Comparison: None.  Findings: There is nonspecific nonobstructive bowel gas pattern. Post cholecystectomy surgical clips are noted.  There  is paucity of bowel gas in the left abdomen.  Midline skin staples are noted within abdomen and pelvis. Degenerative changes lower lumbar spine.  IMPRESSION: Nonspecific nonobstructive bowel gas pattern.  Postsurgical changes are noted.  Paucity of bowel gas in the left abdomen.  NG tube in place.  Original Report Authenticated By: Natasha Mead, M.D.    Significant Diagnostic Studies: CBC Lab Results  Component Value Date   WBC 14.9* 01/02/2012   HGB 9.8* 01/02/2012   HCT 29.4* 01/02/2012   MCV 93.6 01/02/2012   PLT 276 01/02/2012    BMET    Component Value Date/Time   NA 151* 01/02/2012 0514   K 3.3* 01/02/2012 0514   CL 117* 01/02/2012 0514   CO2 26 01/02/2012 0514   GLUCOSE 131* 01/02/2012 0514   BUN 21 01/02/2012 0514   CREATININE 0.81 01/02/2012 0514   CREATININE 0.80 12/23/2011 0824   CALCIUM 9.2 01/02/2012 0514   GFRNONAA 66* 01/02/2012 0514   GFRAA 76* 01/02/2012 0514   COAG Lab Results  Component Value Date   INR 1.93* 12/27/2011   INR 1.08 12/18/2011   INR 1.22 11/04/2011   No results found for this basename: PTT    I/O last 3 completed shifts: In: 1630 [I.V.:1350; NG/GT:180; IV Piggyback:100] Out: 4250 [Urine:3100; Emesis/NG output:1150]    Physical Examination BP Readings from Last 3 Encounters:  01/02/12 158/68  01/02/12 158/68  12/18/11 141/82   Temp Readings from Last 3 Encounters:  01/02/12 98.8 F (37.1 C) Oral  01/02/12 98.8 F (37.1 C) Oral  12/18/11 98.2 F (36.8 C)    SpO2 Readings from Last 3 Encounters:  01/02/12 95%  01/02/12  95%  12/18/11 96%   Pulse Readings from Last 3 Encounters:  01/02/12 58  01/02/12 58  12/18/11 81    General: Awake, more calm, cooperative although still disoriented, WDWN female in NAD Pulmonary: normal non-labored breathing , without Rales, rhonchi,  wheezing Cardiac: Heart rate : regular ,  Abdomen:abdomen soft, non-tender and still quiet  Abdominal wound:clean, dry, intact  Neurologic: SENSATION: normal; MOTOR  FUNCTION:  moving all extremities equally. Speech is fluent/normal Vascular Exam:BLE warm and well perfused Extremities without ischemic changes, no Gangrene , no cellulitis; no open wounds;   LOWER EXTREMITY PULSES           RIGHT                                      LEFT      POSTERIOR TIBIAL palpable palpable       DORSALIS PEDIS      ANTERIOR TIBIAL palpable palpable    Assessment/Plan:  Paula [name] is a 78 y.o. female who is 6 Days Post-Op Procedure(s): ANEURYSM ABDOMINAL AORTIC REPAIR Prolonged ilieus, pulled NGT back minimally to change position as aspirate with more coffee ground look to it this am Post-op Anemia stable U/O - good diuresis with lasix yesterday, will give another dose today Leave foley - pt needs strict I./O sec to prolonged course and need to keep track of diuresis Disorientation: pt more calm but still disoriented - continue to reorient  cont restraints while NGT in place PT/OT  ROCZNIAK,REGINA J [phone] 01/02/2012 8:36 AM   Abd very soft and non tender High NG output overnight.  Will keep in for now Needs to be OOB and ambulation Placed IS at bedside Will consider CT id ileus continues much lionger  Wells Craig Wisnewski

## 2012-01-02 NOTE — Progress Notes (Signed)
01/02/2012 11:53 AM Paged md concerning occult blood in gastric fluid. Also patient has had 400cc of gastric fluid out in last 4 hours. No orders received, will continue to monitor.  Celesta Gentile

## 2012-01-03 LAB — BASIC METABOLIC PANEL
BUN: 19 mg/dL (ref 6–23)
BUN: 18 mg/dL (ref 6–23)
CO2: 27 mEq/L (ref 19–32)
CO2: 29 mEq/L (ref 19–32)
Calcium: 9.1 mg/dL (ref 8.4–10.5)
Chloride: 114 mEq/L — ABNORMAL HIGH (ref 96–112)
GFR calc non Af Amer: 66 mL/min — ABNORMAL LOW (ref >90)
Glucose, Bld: 140 mg/dL — ABNORMAL HIGH (ref 70–99)
Glucose, Bld: 130 mg/dL — ABNORMAL HIGH (ref 70–99)
Potassium: 3.2 mEq/L — ABNORMAL LOW (ref 3.5–5.1)
Sodium: 150 mEq/L — ABNORMAL HIGH (ref 135–145)

## 2012-01-03 LAB — CBC
HCT: 30.7 % — ABNORMAL LOW (ref 36.0–46.0)
Hemoglobin: 10 g/dL — ABNORMAL LOW (ref 12.0–15.0)
MCH: 30.7 pg (ref 26.0–34.0)
MCHC: 32.6 g/dL (ref 30.0–36.0)
RBC: 3.26 MIL/uL — ABNORMAL LOW (ref 3.87–5.11)

## 2012-01-03 LAB — GLUCOSE, CAPILLARY
Glucose-Capillary: 142 mg/dL — ABNORMAL HIGH (ref 70–99)
Glucose-Capillary: 118 mg/dL — ABNORMAL HIGH (ref 70–99)
Glucose-Capillary: 131 mg/dL — ABNORMAL HIGH (ref 70–99)

## 2012-01-03 LAB — URINALYSIS, ROUTINE W REFLEX MICROSCOPIC
Bilirubin Urine: NEGATIVE
Ketones, ur: NEGATIVE mg/dL
Nitrite: NEGATIVE
Urobilinogen, UA: 0.2 mg/dL (ref 0.0–1.0)

## 2012-01-03 MED ORDER — FUROSEMIDE 10 MG/ML IJ SOLN
10.0000 mg | Freq: Once | INTRAMUSCULAR | Status: AC
  Administered 2012-01-03: 10 mg via INTRAVENOUS
  Filled 2012-01-03: qty 1

## 2012-01-03 MED ORDER — POTASSIUM CHLORIDE IN NACL 20-0.45 MEQ/L-% IV SOLN
INTRAVENOUS | Status: DC
  Administered 2012-01-03: 20:00:00 via INTRAVENOUS
  Administered 2012-01-04: 100 mL/h via INTRAVENOUS
  Administered 2012-01-04 – 2012-01-05 (×2): via INTRAVENOUS
  Filled 2012-01-03 (×8): qty 1000

## 2012-01-03 MED ORDER — BISACODYL 10 MG RE SUPP: 10.0000 mg | Freq: Every day | RECTAL | Status: DC | PRN

## 2012-01-03 MED ORDER — POTASSIUM CHLORIDE 10 MEQ/100ML IV SOLN
10.0000 meq | INTRAVENOUS | Status: AC
  Administered 2012-01-03 (×3): 10 meq via INTRAVENOUS
  Filled 2012-01-03: qty 400

## 2012-01-03 NOTE — Progress Notes (Signed)
Occupational Therapy Treatment Patient Details Name: KEAGHAN BOWENS MRN: [id number] DOB: Mar 09, 1927 Today's Date: 01/03/2012  OT Assessment/Plan OT Assessment/Plan Comments on Treatment Session: This 78 yo making progress since evaluation. Will continue to benefit from acute OT with follow-up OT in CIR. OT Plan: Discharge plan remains appropriate OT Frequency: Min 2X/week Follow Up Recommendations: Inpatient Rehab Equipment Recommended: Defer to next venue OT Goals ADL Goals ADL Goal: Grooming - Progress: Progressing toward goals ADL Goal: Toilet Transfer - Progress: Progressing toward goals  OT Treatment Precautions/Restrictions  Precautions Precautions: Fall Required Braces or Orthoses: No Restrictions Weight Bearing Restrictions: No   ADL ADL Eating/Feeding: Performed;Minimal assistance;Other (comment) (ice chips with spoon) Where Assessed - Eating/Feeding: Chair Grooming: Performed;Brushing hair;Moderate assistance;Teeth care Grooming Details (indicate cue type and reason): started out standing at sink for hair, pt did about 25% then she sat down and completed another 50% with encouragment. As for teeth, pt used a toothette dipped in Biotene and performed 50% by herself Where Assessed - Grooming: Sitting, chair;Supported Statistician: Simulated;+2 Total assistance;Other (comment) (80%, ambulated 30 feet) Toilet Transfer Details (indicate cue type and reason): Needed total A to maneuver RW and mod encouragement to keep going Toilet Transfer Method: Ambulating Ambulation Related to ADLs: +2 total A (80%) ADL Comments: Stood at sink in bathroom from recliner to work on combing hair, pt did 25% then sat down and would not try again in standing ("I don't think so, I will just sit here") Mobility  Bed Mobility Bed Mobility: Yes Rolling Right: 3: Mod assist;With rail Right Sidelying to Sit: 2: Max assist;HOB flat;With rails Right Sidelying to Sit Details (indicate cue type and  reason): Pt needed VCs and physical A to let go of rail for right sidelying to sit, once sitting initally required Max a for balance but progressed to Min guard A Transfers Transfers: Yes Sit to Stand: With upper extremity assist;From bed;Other (comment);Patient percentage (comment) (cues for hand placement; pt=80%)) Stand to Sit: To chair/3-in-1;With armrests;1: +2 Total assist;Patient percentage (comment) Stand to Sit Details: Pt needed VCs and physical A for hand placement, pt=80%) Exercises    End of Session OT - End of Session Equipment Utilized During Treatment: Gait belt;Other (comment) (RW) Activity Tolerance: Patient tolerated treatment well Patient left: in chair;with call bell in reach;with family/visitor present;Other (comment) (husband, RUE restraint left off with husband watching pt) Nurse Communication: Mobility status for transfers;Other (comment) (2 people with RW with pt doing at least 50%) General Behavior During Session: [facility] for tasks performed Cognition: Impaired  Evette Georges [phone] 01/03/2012, 3:15 PM

## 2012-01-03 NOTE — Progress Notes (Addendum)
[facility] [place]  Post-op  Intra-abdominal Surgery note  Date of Surgery: 12/27/2011 Surgeon: Surgeon(s): Josephina Gip, MD POD: 7 Days Post-Op ANEURYSM ABDOMINAL AORTIC REPAIR  History of Present Illness  Paula [name] is a 78 y.o. female who is  up s/p Procedure(s): ANEURYSM ABDOMINAL AORTIC REPAIR Pt is doing well. has had flatus;has not had BM. Pt hungry. No abd pain.  IMAGING: No results found.  Significant Diagnostic Studies: CBC Lab Results  Component Value Date   WBC 15.6* 01/03/2012   HGB 10.0* 01/03/2012   HCT 30.7* 01/03/2012   MCV 94.2 01/03/2012   PLT 302 01/03/2012    BMET    Component Value Date/Time   NA 150* 01/03/2012 0437   K 3.2* 01/03/2012 0437   CL 114* 01/03/2012 0437   CO2 27 01/03/2012 0437   GLUCOSE 140* 01/03/2012 0437   BUN 19 01/03/2012 0437   CREATININE 0.81 01/03/2012 0437   CREATININE 0.80 12/23/2011 0824   CALCIUM 8.9 01/03/2012 0437   GFRNONAA 66* 01/03/2012 0437   GFRAA 76* 01/03/2012 0437   COAG Lab Results  Component Value Date   INR 1.93* 12/27/2011   INR 1.08 12/18/2011   INR 1.22 11/04/2011   No results found for this basename: PTT    I/O last 3 completed shifts: In: 1660 [I.V.:1400; NG/GT:210; IV Piggyback:50] Out: 5000 [Urine:3550; Emesis/NG output:1450]    Physical Examination BP Readings from Last 3 Encounters:  01/02/12 156/57  01/02/12 156/57  12/18/11 141/82   Temp Readings from Last 3 Encounters:  01/03/12 97.6 F (36.4 C) Oral  01/03/12 97.6 F (36.4 C) Oral  12/18/11 98.2 F (36.8 C)    SpO2 Readings from Last 3 Encounters:  01/03/12 94%  01/03/12 94%  12/18/11 96%   Pulse Readings from Last 3 Encounters:  01/03/12 72  01/03/12 72  12/18/11 81    General: Awake, alert, still disoriented especially at night, WDWN female in NAD Pulmonary: normal non-labored breathing , without Rales, rhonchi,  wheezing Cardiac: Heart rate : regular ,  Abdomen:abdomen soft, non-tender and has  bowel sounds today  Abdominal wound:clean, dry, intact  Neurologic: calmer; Appropriate Affect ; SENSATION: normal; MOTOR FUNCTION:  moving all extremities equally. Speech is fluent/normal  Vascular Exam:BLE warm and well perfused Extremities without ischemic changes, no Gangrene , no cellulitis; no open wounds;   Assessment/Plan:  Paula [name] is a 78 y.o. female who is 7 Days Post-Op Procedure(s): ANEURYSM ABDOMINAL AORTIC REPAIR Prolonged Ilieus - abdomen softer, has bowel sounds this am NGT 300cc/12 hours, 1000cc in 24hours Diuresing with lasix - on daily diuretics at home Hypokalemia - replace K+ WBC climbing- will recheck U/A, amylase, encourage IS Dulcolax supp today Lasix 10mg  IV today after K+ in recheck K+ this afternoon  Marlowe Shores [phone] 01/03/2012 7:31 AM  Addendum: Agree with above. Hypernatremia. C/o thirst. Will increase IVF, and recheck Na in AM. NGT 1,000 cc today. NGT stays for now.  Di Kindle. Edilia Bo, MD, FACS Beeper [phone] 01/03/2012

## 2012-01-03 NOTE — Progress Notes (Signed)
INITIAL ADULT NUTRITION ASSESSMENT Date: 01/03/2012   Time: 12:29 PM Reason for Assessment: NPO x 7 days  ASSESSMENT: Female 78 y.o.  Dx: s/p peri-renal abdominal aortic aneurysm repair, prolonged ileus  Past Medical History  Diagnosis Date  . Diabetes mellitus   . COPD (chronic obstructive pulmonary disease)   . Hypertension   . Shortness of breath   . Hypothyroidism   . Depression   . Recurrent upper respiratory infection (URI)   . Seizures   . Ejection fraction     EF 40%, echo, November, 2012  / in improved, EF 60%, echo, December, 2012  . Abnormal EKG     November, 2012  . Contrast media allergy     Patient feels poorly with contrast  . AAA (abdominal aortic aneurysm)     10/2011 - 5.4cm - scheduled to f/u Dr. Hart [place]  . Pneumonia     10/2011  . Pre-syncope     vasovagal w/ heart block  . Parotid mass     has refused further eval 10/2011  . Chest pain at rest     Myoview 10/14/2011: ef 60%, ? dist anteroseptal scar w/ sublte peri-infarct ischemia vs. attenuation  . Hyperthyroidism   . CHF (congestive heart failure)   . GERD (gastroesophageal reflux disease)   . Headache   . Arthritis   . Anxiety   . Preop cardiovascular exam     Abdominal aortic aneurysm repair by Dr.Lawson  . PONV (postoperative nausea and vomiting)   . Facial tic     L sided  . Macular degeneration    Scheduled Meds:    . famotidine (PEPCID) IV  20 mg Intravenous Q12H  . furosemide  10 mg Intravenous Once  . furosemide  20 mg Intravenous Once  . insulin aspart  0-15 Units Subcutaneous Q4H  . levothyroxine  76 mcg Intravenous Daily  . potassium chloride  10 mEq Intravenous Q1 Hr x 4  . potassium chloride  10 mEq Intravenous Q1 Hr x 4  . sodium chloride  500 mL Intravenous Once  . sucralfate  1 g Per Tube Q6H  . DISCONTD: metoprolol  5 mg Intravenous Q6H   Continuous Infusions:    . dextrose 5 % and 0.9 % NaCl with KCl 20 mEq/L 50 mL/hr at 01/03/12 1133   PRN Meds:.acetaminophen,  acetaminophen, albuterol, bisacodyl, fentaNYL, hydrALAZINE, labetalol, metoprolol, ondansetron, phenol, DISCONTD: bisacodyl   Ht: 5\' 5"  (165.1 cm)  Wt: 171 lb 15.3 oz (78 kg)  Ideal Wt: 56.8 kg % Ideal Wt: 137%   Body mass index is 28.62 kg/(m^2).  Food/Nutrition Related Hx:  Remained intubated post-op, was extubated 12/28/11. NGT remains and placement verified by radiology. Pt reports being hungry, no abdominal pain, per progress note. + BS and flatus, no BM.  Labs:  CMP     Component Value Date/Time   NA 150* 01/03/2012 0437   K 3.2* 01/03/2012 0437   CL 114* 01/03/2012 0437   CO2 27 01/03/2012 0437   GLUCOSE 140* 01/03/2012 0437   BUN 19 01/03/2012 0437   CREATININE 0.81 01/03/2012 0437   CREATININE 0.80 12/23/2011 0824   CALCIUM 8.9 01/03/2012 0437   PROT 3.7* 12/28/2011 0400   ALBUMIN 2.0* 12/28/2011 0400   AST 18 12/28/2011 0400   ALT 19 12/28/2011 0400   ALKPHOS 29* 12/28/2011 0400   BILITOT 0.4 12/28/2011 0400   GFRNONAA 66* 01/03/2012 0437   GFRAA 76* 01/03/2012 0437    CBG (last 3)   Basename  01/03/12 0835 01/03/12 0406 01/02/12 2341  GLUCAP 134* 142* 125*     Intake/Output Summary (Last 24 hours) at 01/03/12 1229 Last data filed at 01/03/12 0600  Gross per 24 hour  Intake   1580 ml  Output   3050 ml  Net  -1470 ml  NGT output 1000 mL per 24 hours.  Diet Order: NPO  Supplements/Tube Feeding: None at this time.  IVF:     dextrose 5 % and 0.9 % NaCl with KCl 20 mEq/L Last Rate: 50 mL/hr at 01/03/12 1133    Estimated Nutritional Needs:   Kcal: 1650-1850 Protein: 80-90 gm Fluid: >2.3 L  NUTRITION DIAGNOSIS: -Inadequate oral intake (NI-2.1).  Status: Ongoing  RELATED TO: prolonged ileus  AS EVIDENCE BY: NPO status x 7 days  MONITORING/EVALUATION(Goals): Goal: Pt to tolerate diet advancement when appropriate. Monitor: diet advancement and PO intake, weight changes, labs  EDUCATION NEEDS: -No education needs identified at this  time  INTERVENTION: Encourage PO intake as appropriate. RD to follow.   DOCUMENTATION CODES Per approved criteria  -Not Applicable    Leonette Most 01/03/2012, 12:29 PM

## 2012-01-03 NOTE — Progress Notes (Signed)
Utilization review completed. Claretha Townshend, RN, BSN. 01/03/12 

## 2012-01-04 LAB — GLUCOSE, CAPILLARY
Glucose-Capillary: 116 mg/dL — ABNORMAL HIGH (ref 70–99)
Glucose-Capillary: 116 mg/dL — ABNORMAL HIGH (ref 70–99)
Glucose-Capillary: 100 mg/dL — ABNORMAL HIGH (ref 70–99)
Glucose-Capillary: 146 mg/dL — ABNORMAL HIGH (ref 70–99)

## 2012-01-04 LAB — BASIC METABOLIC PANEL
BUN: 17 mg/dL (ref 6–23)
CO2: 26 mEq/L (ref 19–32)
Chloride: 113 mEq/L — ABNORMAL HIGH (ref 96–112)
Creatinine, Ser: 0.77 mg/dL (ref 0.50–1.10)
Potassium: 3.2 mEq/L — ABNORMAL LOW (ref 3.5–5.1)

## 2012-01-04 NOTE — Progress Notes (Addendum)
[facility] Progress Note  01/04/2012 7:26 AM POD 8  Subjective:  Confused.  Has sitter.  Denies flatus.  Afebrile x 24hrs  94%RA  150-170sys  HR 49-70 Reg Filed Vitals:   01/04/12 0400  BP: 174/62  Pulse: 60  Temp: 98.1 F (36.7 C)  Resp: 25    Physical Exam: Cardiac:  RRR Lungs:  CTAB Abdomen:  Soft; NT/ND minimal BS Incisions:  C/d/i with staples in place.  No drainage. Extremities:  +palpable DP pulses bilaterally with the right > left.  CBC    Component Value Date/Time   WBC 15.6* 01/03/2012 0437   RBC 3.26* 01/03/2012 0437   HGB 10.0* 01/03/2012 0437   HCT 30.7* 01/03/2012 0437   PLT 302 01/03/2012 0437   MCV 94.2 01/03/2012 0437   MCH 30.7 01/03/2012 0437   MCHC 32.6 01/03/2012 0437   RDW 17.3* 01/03/2012 0437   LYMPHSABS 2.4 12/23/2011 0824   MONOABS 0.5 12/23/2011 0824   EOSABS 0.1 12/18/2011 1217   BASOSABS 0.1 12/18/2011 1217    BMET    Component Value Date/Time   NA 147* 01/04/2012 0430   K 3.2* 01/04/2012 0430   CL 113* 01/04/2012 0430   CO2 26 01/04/2012 0430   GLUCOSE 116* 01/04/2012 0430   BUN 17 01/04/2012 0430   CREATININE 0.77 01/04/2012 0430   CREATININE 0.80 12/23/2011 0824   CALCIUM 8.7 01/04/2012 0430   GFRNONAA 76* 01/04/2012 0430   GFRAA 88* 01/04/2012 0430    INR    Component Value Date/Time   INR 1.93* 12/27/2011 1300     Intake/Output Summary (Last 24 hours) at 01/04/12 0726 Last data filed at 01/04/12 0600  Gross per 24 hour  Intake   1900 ml  Output    775 ml  Net   1125 ml   NGT OUTPUT:  200CC/24Hrs  Assessment/Plan:  78 y.o. female is s/p AAA Repair POD 8 -check labs in am -hypernatremia improved from yesterday.  Continue IVF. -NGT output down from yesterday.  ? D/c NGT.  Will d/w with Dr. Edilia Bo -minimal BS; continue NPO -hypokalemia--K+ in IVF  Newton Pigg, PA-C [facility] [phone] 01/04/2012 7:26 AM  Agree with above. 200 cc from NGT. Has been passing flatus. Has bowel  sounds. Will D/C NGT. Start sips of full liquids tonight. Transfer to 2000.  Di Kindle. Edilia Bo, MD, FACS Beeper [phone] 01/04/2012

## 2012-01-04 NOTE — Progress Notes (Signed)
Patient being transferred to unit 2000, report called to receiving RN, pt transferred in wheel chair , with her husband at her side.

## 2012-01-05 LAB — GLUCOSE, CAPILLARY
Glucose-Capillary: 121 mg/dL — ABNORMAL HIGH (ref 70–99)
Glucose-Capillary: 110 mg/dL — ABNORMAL HIGH (ref 70–99)

## 2012-01-05 LAB — CBC
HCT: 33.2 % — ABNORMAL LOW (ref 36.0–46.0)
Hemoglobin: 10.7 g/dL — ABNORMAL LOW (ref 12.0–15.0)
RDW: 17.4 % — ABNORMAL HIGH (ref 11.5–15.5)
WBC: 13.8 10*3/uL — ABNORMAL HIGH (ref 4.0–10.5)

## 2012-01-05 LAB — BASIC METABOLIC PANEL
BUN: 16 mg/dL (ref 6–23)
CO2: 24 mEq/L (ref 19–32)
Chloride: 113 mEq/L — ABNORMAL HIGH (ref 96–112)
Glucose, Bld: 113 mg/dL — ABNORMAL HIGH (ref 70–99)
Potassium: 3.2 mEq/L — ABNORMAL LOW (ref 3.5–5.1)
Sodium: 144 mEq/L (ref 135–145)

## 2012-01-05 MED ORDER — POTASSIUM CHLORIDE CRYS ER 20 MEQ PO TBCR
20.0000 meq | EXTENDED_RELEASE_TABLET | Freq: Two times a day (BID) | ORAL | Status: AC
  Administered 2012-01-05 (×2): 20 meq via ORAL
  Filled 2012-01-05 (×2): qty 1

## 2012-01-05 NOTE — Progress Notes (Addendum)
[facility] Progress Note  01/05/2012 8:48 AM POD 9  Subjective:  Wants to get up to chair. No complaints.    Tm 99.3 95%RA Filed Vitals:   01/05/12 0426  BP: 158/82  Pulse: 78  Temp: 99.3 F (37.4 C)  Resp: 20     Physical Exam: Cardiac:  RRR Lungs:  CTAB Abdomen:  Soft, NT/ND +BS Incisions:  C/d/i with staples in place   CBC    Component Value Date/Time   WBC 13.8* 01/05/2012 0455   RBC 3.50* 01/05/2012 0455   HGB 10.7* 01/05/2012 0455   HCT 33.2* 01/05/2012 0455   PLT 315 01/05/2012 0455   MCV 94.9 01/05/2012 0455   MCH 30.6 01/05/2012 0455   MCHC 32.2 01/05/2012 0455   RDW 17.4* 01/05/2012 0455   LYMPHSABS 2.4 12/23/2011 0824   MONOABS 0.5 12/23/2011 0824   EOSABS 0.1 12/18/2011 1217   BASOSABS 0.1 12/18/2011 1217    BMET    Component Value Date/Time   NA 144 01/05/2012 0455   K 3.2* 01/05/2012 0455   CL 113* 01/05/2012 0455   CO2 24 01/05/2012 0455   GLUCOSE 113* 01/05/2012 0455   BUN 16 01/05/2012 0455   CREATININE 0.74 01/05/2012 0455   CREATININE 0.80 12/23/2011 0824   CALCIUM 8.5 01/05/2012 0455   GFRNONAA 77* 01/05/2012 0455   GFRAA 89* 01/05/2012 0455    INR    Component Value Date/Time   INR 1.93* 12/27/2011 1300     Intake/Output Summary (Last 24 hours) at 01/05/12 0848 Last data filed at 01/05/12 0700  Gross per 24 hour  Intake   1210 ml  Output    550 ml  Net    660 ml     Assessment/Plan:  78 y.o. female is s/p AAA Repair  POD 9 -confusion seems to be improved from yesterday. -hypernatremia WNL-will dc IVF -pt NGT and tolerating diet.  Will advance to carb modified diet. -OOB to chair tid with meals and ambulate in halls with assist.  Needs to mobilize. -hypokalemia-will supplement with po -d/c foley -ck labs in am -WBC improved but still elevated. -Hct improved.  Newton Pigg, PA-C [facility] [phone] 01/05/2012 8:48 AM  Addendum:  Doing well with NG tube out. No nausea or vomiting.  Tolerating liquids. Diet being advanced. Incisions look fine. Potassium being supplemented.  Di Kindle. Edilia Bo, MD, FACS Beeper [phone] 01/05/2012

## 2012-01-06 ENCOUNTER — Inpatient Hospital Stay (HOSPITAL_COMMUNITY): Payer: Medicare Other

## 2012-01-06 LAB — BASIC METABOLIC PANEL
BUN: 13 mg/dL (ref 6–23)
CO2: 23 mEq/L (ref 19–32)
Calcium: 8.4 mg/dL (ref 8.4–10.5)
Chloride: 112 mEq/L (ref 96–112)
Creatinine, Ser: 0.74 mg/dL (ref 0.50–1.10)
Glucose, Bld: 104 mg/dL — ABNORMAL HIGH (ref 70–99)

## 2012-01-06 LAB — CBC
HCT: 33.8 % — ABNORMAL LOW (ref 36.0–46.0)
Hemoglobin: 11 g/dL — ABNORMAL LOW (ref 12.0–15.0)
MCH: 30.9 pg (ref 26.0–34.0)
MCHC: 32.5 g/dL (ref 30.0–36.0)
MCV: 94.9 fL (ref 78.0–100.0)
RBC: 3.56 MIL/uL — ABNORMAL LOW (ref 3.87–5.11)

## 2012-01-06 LAB — GLUCOSE, CAPILLARY: Glucose-Capillary: 139 mg/dL — ABNORMAL HIGH (ref 70–99)

## 2012-01-06 MED ORDER — INSULIN ASPART 100 UNIT/ML ~~LOC~~ SOLN
0.0000 [IU] | Freq: Every day | SUBCUTANEOUS | Status: DC
  Filled 2012-01-06: qty 3

## 2012-01-06 MED ORDER — OXYCODONE HCL 5 MG PO TABS: 5.0000 mg | ORAL_TABLET | Freq: Four times a day (QID) | ORAL | Status: AC | PRN

## 2012-01-06 MED ORDER — INSULIN ASPART 100 UNIT/ML ~~LOC~~ SOLN
0.0000 [IU] | Freq: Three times a day (TID) | SUBCUTANEOUS | Status: DC
  Administered 2012-01-07: 3 [IU] via SUBCUTANEOUS

## 2012-01-06 MED ORDER — FAMOTIDINE 20 MG PO TABS
20.0000 mg | ORAL_TABLET | Freq: Two times a day (BID) | ORAL | Status: DC
  Administered 2012-01-06 – 2012-01-07 (×3): 20 mg via ORAL
  Filled 2012-01-06 (×4): qty 1

## 2012-01-06 NOTE — Progress Notes (Addendum)
CARE MANAGEMENT NOTE 01/06/2012        Action/Plan:   Discharge planning.Spoke with patient and husband.Choice offered.   Anticipated DC Date:  01/07/2012   Anticipated DC Plan:  HOME W HOME HEALTH SERVICES      DC Planning Services  CM consult      [facility] Choice  HOME HEALTH   Choice offered to / List presented to:  C-3 Spouse   DME arranged  3-N-1      DME agency  [facility]     HH arranged  HH-1 RN  HH-2 PT    HH  NA  HH agency  [facility]   Status of service:  Completed, signed off     Discharge Disposition:  HOME W HOME HEALTH SERVICES   Comments:  01/06/12 1400 Vance Peper, RN BSN Case Manager Spoke with patient and husband regarding home health needs. Husband expressed concerns regarding wifes medication and fact that he doesnt know what she should be taking and when. Patient states she does remember much about her meds at present. Discussed having a nurse come out to assess and assist with meds.Both patient and husband expressed relief with the idea. Also asked bedside RN to be sure meds are explained to them at discharge. Asked patient's bedside RN to obtain order for [facility] RN.

## 2012-01-06 NOTE — Progress Notes (Signed)
Physical Therapy Treatment Patient Details Name: Paula [name] MRN: [id number] DOB: 1927-02-02 Today's Date: 01/06/2012  PT Assessment/Plan  PT - Assessment/Plan Comments on Treatment Session: Pt admitted s/p AAA repair and has progressed great.  Pt still limited in safety by confusion.  Husband reports he can provide 24 hour assistance and educated husband/pt on importance of this.   PT Plan: Discharge plan needs to be updated;Frequency remains appropriate PT Frequency: Min 3X/week Follow Up Recommendations: Home health PT;Supervision/Assistance - 24 hour Equipment Recommended: None recommended by PT PT Goals  Acute Rehab PT Goals PT Goal Formulation: With patient/family Time For Goal Achievement: 2 weeks Pt will go Supine/Side to Sit: with modified independence PT Goal: Supine/Side to Sit - Progress: Updated due to goal met Pt will go Sit to Stand: with supervision PT Goal: Sit to Stand - Progress: Updated due to goal met Pt will go Stand to Sit: with supervision PT Goal: Stand to Sit - Progress: Updated due to goals met Pt will Ambulate: 51 - 150 feet;with supervision;with least restrictive assistive device PT Goal: Ambulate - Progress: Updated due to goal met  PT Treatment Precautions/Restrictions  Precautions Precautions: Fall Precaution Comments: confused Required Braces or Orthoses: No Restrictions Weight Bearing Restrictions: No Pain 0/10 with treatment. Mobility (including Balance) Bed Mobility Bed Mobility: Yes Rolling Right: Not tested (comment) Right Sidelying to Sit: Not tested (comment) Supine to Sit: 5: Supervision;HOB elevated (Comment degrees) (HOB at 45 degrees.) Supine to Sit Details (indicate cue type and reason): Verbal cues for sequence and safety. Sitting - Scoot to Edge of Bed: 5: Supervision Sitting - Scoot to Annetta of Bed Details (indicate cue type and reason): Verbal cues for sequence. Transfers Transfers: Yes Sit to Stand: 4: Min assist;With  upper extremity assist;From bed (Min (guard)) Sit to Stand Details (indicate cue type and reason): Guarding for balance with cues for hand placement. Stand to Sit: 4: Min assist;With upper extremity assist;To chair/3-in-1 Stand to Sit Details: Assist to slow descent with cues for safest hand placement. Ambulation/Gait Ambulation/Gait: Yes Ambulation/Gait Assistance: 4: Min assist (Min (guard)) Ambulation/Gait Assistance Details (indicate cue type and reason): Guarding for balance with chair follow for fatigue.  Cues to increase step length and gait velocity. Ambulation Distance (Feet): 95 Feet Assistive device: Rolling walker Gait Pattern: Step-through pattern;Decreased stride length;Shuffle Stairs: No Wheelchair Mobility Wheelchair Mobility: No  Posture/Postural Control Posture/Postural Control: No significant limitations Balance Balance Assessed: No End of Session PT - End of Session Equipment Utilized During Treatment: Gait belt Activity Tolerance: Patient tolerated treatment well Patient left: in chair;with call bell in reach;with family/visitor present Psychiatrist also present.) Nurse Communication: Mobility status for transfers;Mobility status for ambulation General Behavior During Session: [facility] for tasks performed Cognition: Impaired Cognitive Impairment: Pt with sustained attention throughout treatment.  Still with decreased safety.  Paula [name] M 01/06/2012, 9:30 AM  01/06/2012 Cephus Shelling, PT, DPT [phone]

## 2012-01-06 NOTE — Discharge Summary (Signed)
[facility] Discharge Summary  Paula [name] 1927-05-18 78 y.o. female  [id number]  Admission Date: 12/27/2011  Discharge Date: 01/07/12  Physician: Josephina Gip, MD  Admission Diagnosis: AAA   HPI:   This is a 78 y.o. female returns today for further discussion regarding her abdominal aortic aneurysm which I have been following. She had an evaluation by Dr. Jerral Bonito who has cleared her from a cardiac standpoint. She has a normal ejection fraction. CT scan was performed today but could not be done with contrast because of the patient's history of a contrast reaction. I have reviewed the CT scan and the patient is not a stent graft candidate because of the diameter of the neck and the angulated neck. She will need open repair of this infrarenal aortic aneurysm.    Hospital Course:  The patient was admitted to the hospital and taken to the operating room on 12/27/2011 and underwent Resection and grafting of peri-renal abdominal aortic aneurysm with insertion aorto right common iliac and left external iliac bypass using 2010 mm Hemashield Dacron graft.  The pt tolerated the procedure well and was transported to the PACU in good condition. By POD 1, she remained intubated.   She did have palpable pedal pulses and her abdomen was soft.  By POD she was extubated.   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   She did have decreased UOP likely secondary to ATN given temporary suprarenal clamp and foley was left in place.  Her NGT remained in for several days due to increased output.   She also had acute surgical blood loss anemia and was treated with blood transfusion.  She did have so hypokalemia and this was supplemented.  PT/OT was consulted.  On POD 6, she did have more confusion and was placed in restraints d/t trying to pull out NGT.  On POD 8, her NGT was removed and she was advanced to a clear liquid diet.  She tolerated this well.  She was transferred to the telemetry floor  this day.  She is d/c'd home on POD 11.  On POD 10, she did have fever of 101.  CXR was repeated and had actually improved.  All of her wounds show no signs on infection.  OT stated in their note that she does not feel the pt and husband are safe to return home together at this time secondary to pt unsteadiness and confusion.  The pt is alert to time and place at this time.  She recommended 24 hr supervision at home, but the husband refused that as well as SNF stating that they have adult children that can help out if needed.  The remainder of the hospital course consisted of increasing ambulation and increasing intake of solids without difficulty.    Basename 01/06/12 0655 01/05/12 0455  NA 142 144  K 4.2 3.2*  CL 112 113*  CO2 23 24  GLUCOSE 104* 113*  BUN 13 16  CALCIUM 8.4 8.5    Basename 01/06/12 0843 01/05/12 0455  WBC 15.7* 13.8*  HGB 11.0* 10.7*  HCT 33.8* 33.2*  PLT 325 315   No results found for this basename: INR:2 in the last 72 hours   Discharge Instructions:   The patient is discharged to home with extensive instructions on wound care and progressive ambulation.  They are instructed not to drive or perform any heavy lifting until returning to see the physician in his office.  Discharge Orders    Future  Appointments: Provider: Department: Dept Phone: Center:   03/13/2012 9:00 AM Luis Abed, MD [facility] [phone] [facility]     Future Orders Please Complete By Expires   CBC w/Diff  12/30/11 12/19/12   Resume previous diet      Driving Restrictions      Comments:   No driving for 4 weeks   Lifting restrictions      Comments:   No lifting for 6 weeks   Call MD for:  temperature >100.5      Call MD for:  redness, tenderness, or signs of infection (pain, swelling, bleeding, redness, odor or green/yellow discharge around incision site)      Call MD for:  severe or increased pain, loss or decreased feeling  in affected limb(s)      ABDOMINAL  PROCEDURE/ANEURYSM REPAIR/AORTO-BIFEMORAL BYPASS:  Call MD for increased abdominal pain; cramping diarrhea; nausea/vomiting      may wash over wound with mild soap and water      Scheduling Instructions:   Shower daily with soap and water starting 01/06/12       Discharge Diagnosis:  AAA  Secondary Diagnosis: Patient Active Problem List  Diagnoses  . COPD (chronic obstructive pulmonary disease) with emphysema  . Diabetes mellitus type II, controlled  . Aneurysm of abdominal aorta  . Parotid mass  . Hypothyroidism  . Sinus bradycardia  . Hypokalemia  . Delirium  . Congestive heart failure  . Ejection fraction  . Abnormal EKG  . Contrast media allergy  . Facial twitching  . Depression  . Anxiety  . Chest pain at rest  . Pre-syncope  . Preop cardiovascular exam  . Abdominal aneurysm without mention of rupture   Past Medical History  Diagnosis Date  . Diabetes mellitus   . COPD (chronic obstructive pulmonary disease)   . Hypertension   . Shortness of breath   . Hypothyroidism   . Depression   . Recurrent upper respiratory infection (URI)   . Seizures   . Ejection fraction     EF 40%, echo, November, 2012  / in improved, EF 60%, echo, December, 2012  . Abnormal EKG     November, 2012  . Contrast media allergy     Patient feels poorly with contrast  . AAA (abdominal aortic aneurysm)     10/2011 - 5.4cm - scheduled to f/u Dr. Hart Rochester  . Pneumonia     10/2011  . Pre-syncope     vasovagal w/ heart block  . Parotid mass     has refused further eval 10/2011  . Chest pain at rest     Myoview 10/14/2011: ef 60%, ? dist anteroseptal scar w/ sublte peri-infarct ischemia vs. attenuation  . Hyperthyroidism   . CHF (congestive heart failure)   . GERD (gastroesophageal reflux disease)   . Headache   . Arthritis   . Anxiety   . Preop cardiovascular exam     Abdominal aortic aneurysm repair by Dr.Lawson  . PONV (postoperative nausea and vomiting)   . Facial tic     L  sided  . Macular degeneration        Jeraline, Marcinek  Home Medication Instructions GLO:[id number]   Printed on:01/06/12 0954  Medication Information                    levothyroxine (SYNTHROID, LEVOTHROID) 150 MCG tablet Take 150 mcg by mouth every morning.            ALPRAZolam (  XANAX) 0.5 MG tablet Take 0.5 mg by mouth 3 (three) times daily as needed. For anxiety           hydrochlorothiazide (HYDRODIURIL) 25 MG tablet Take 25 mg by mouth daily.           aspirin EC 81 MG tablet Take 81 mg by mouth daily.           oxyCODONE (ROXICODONE) 5 MG immediate release tablet Take 1 tablet (5 mg total) by mouth every 6 (six) hours as needed for pain. #30 thirty no refill            Disposition: home  Patient's condition: is Good  Follow up: 1. Dr. Hart Rochester in one week and will need staple removal.   Newton Pigg, PA-C [facility] [phone] 01/06/2012  9:54 AM

## 2012-01-06 NOTE — Progress Notes (Signed)
Pt was going to be discharged this afternoon, however, she has fever of 101 this afternoon.  She had a negative UA on 01/03/12.   She had a CXR on 01/01/12 that revealed decreased lung volumes and bibasilar atelectasis.    We will repeat a 2 view PA and lateral CXR today.  I have encouraged the pt to use her IS 10x/hr while awake and ambulate in halls and OOB to chair at least tid.    She and her husband expressed understanding.    We will re-evaluate pt in the am.  Also called to have a home health consult for home health nurse for home meds.  Newton Pigg, PA-C 01/06/2012 3:03 PM

## 2012-01-06 NOTE — Progress Notes (Signed)
Clinical Child psychotherapist received consult for "DME." CSW consulted with RN, PA, and RNCM. Pt is planning to discharge home with home health services when medically ready. No further clinical social work needs identified. Please reconsult if a need arises prior to discharge. CSW is signing off.   Dede Query, MSW, Theresia Majors [phone]

## 2012-01-06 NOTE — Discharge Instructions (Signed)
Home Health to be provided thru [facility] [phone] 

## 2012-01-06 NOTE — Progress Notes (Signed)
Utilization review completed. Jatinder Mcdonagh, RN, BSN.  01/06/12 

## 2012-01-06 NOTE — Progress Notes (Addendum)
[facility] Progress Note  01/06/2012 7:41 AM POD 10  Subjective:  Somewhat confused this am.  Husband and sitter with pt.  She has eaten all of her breakfast (except oatmeal)   Filed Vitals:   01/06/12 0340  BP: 162/78  Pulse: 80  Temp: 97.9 F (36.6 C)  Resp: 21     Physical Exam: Cardiac:  RRR Lungs:  Non-labored Abdomen:  Soft, NT/ND/ +BS +BM Incisions:  C/d/i with staples in tact. Extremities:  Warm and well perfused.   CBC    Component Value Date/Time   WBC 13.8* 01/05/2012 0455   RBC 3.50* 01/05/2012 0455   HGB 10.7* 01/05/2012 0455   HCT 33.2* 01/05/2012 0455   PLT 315 01/05/2012 0455   MCV 94.9 01/05/2012 0455   MCH 30.6 01/05/2012 0455   MCHC 32.2 01/05/2012 0455   RDW 17.4* 01/05/2012 0455   LYMPHSABS 2.4 12/23/2011 0824   MONOABS 0.5 12/23/2011 0824   EOSABS 0.1 12/18/2011 1217   BASOSABS 0.1 12/18/2011 1217    BMET    Component Value Date/Time   NA 144 01/05/2012 0455   K 3.2* 01/05/2012 0455   CL 113* 01/05/2012 0455   CO2 24 01/05/2012 0455   GLUCOSE 113* 01/05/2012 0455   BUN 16 01/05/2012 0455   CREATININE 0.74 01/05/2012 0455   CREATININE 0.80 12/23/2011 0824   CALCIUM 8.5 01/05/2012 0455   GFRNONAA 77* 01/05/2012 0455   GFRAA 89* 01/05/2012 0455    INR    Component Value Date/Time   INR 1.93* 12/27/2011 1300     Intake/Output Summary (Last 24 hours) at 01/06/12 0741 Last data filed at 01/06/12 0600  Gross per 24 hour  Intake    840 ml  Output   1078 ml  Net   -238 ml     Assessment/Plan:  78 y.o. female is s/p AAA repair  POD 10 -doing well this am with exception of a little confusion. -ambulated in hall yesterday ~ 65ft per husband. ? Home today.  Will d/w Dr. Hart Rochester.   Newton Pigg, PA-C [facility] [phone] 01/06/2012 7:41 AM  Abd soft and non tender 3+ fem pulses bm today Tolerating diet well Ambulating with help of husband   plan    Dc home today    rto 2 weeks    Staples out  next week

## 2012-01-07 NOTE — Progress Notes (Signed)
Pt given discharge instructions, medication list, follow up appointments, s/sx of infection and when to call the doctor.  Pt verbalized understanding of instructions. Paula [name]

## 2012-01-07 NOTE — Progress Notes (Signed)
Occupational Therapy Treatment Patient Details Name: Paula [name] MRN: [id number] DOB: 19-Aug-1927 Today's Date: 01/07/2012  OT Assessment/Plan OT Assessment/Plan Comments on Treatment Session: Pt became angry with therapy when she was asked to practice going up a step in prep for going home stating, "I told you I'm not buying anything!" Pt not responsive to therapy's explanation that we are not trying to sell her anything. Do not feel pt and husband are safe to return home together at this time seconary to patient unsteadiness and confusion. Recommend 24 hr supervision at home, but husband has informed therapy he will have to run out "at times" and does not believe he can find anyone to sit with wife during this time. Spoke with CSW regarding ALF and paid home health aides. Emphasized to husband importance of direct/ side-by-side supervision for patient- husband verbalized understanding. OT Plan: Discharge plan needs to be updated Follow Up Recommendations: Home health OT Equipment Recommended:  (already has been brought to room) OT Goals ADL Goals ADL Goal: Lower Body Dressing - Progress: Met ADL Goal: Toilet Transfer - Progress: Met ADL Goal: Toileting - Hygiene - Progress: Progressing toward goals  OT Treatment Precautions/Restrictions  Precautions Precautions: Fall Precaution Comments: confused Required Braces or Orthoses: No Restrictions Weight Bearing Restrictions: No   ADL ADL Lower Body Dressing: Performed;Moderate assistance Lower Body Dressing Details (indicate cue type and reason): pt confused and with difficulty initiating dressing despite being given clothing. Husband threaded bilateral legs for underwear and pants as well as assited pt with pulling up over hips Where Assessed - Lower Body Dressing: Sit to stand from chair Toilet Transfer: Performed;Minimal assistance Toilet Transfer Details (indicate cue type and reason): VC for safe hand placement despite educating pt  on hand pladement multiple times earlier in session Toilet Transfer Method: Ambulating Toilet Transfer Equipment: Bedside commode Toileting - Clothing Manipulation: Minimal assistance;Simulated Where Assessed - Toileting Clothing Manipulation: Standing Ambulation Related to ADLs: Min guard A with RW ambulation >39ft. Pt O2 sats decreased to 90% and pt became somewhat dyspenic- educated pt and wife to stop when this happens and allow pt to catch her breath. Pt stated, "It's only like this because of the weather."  Mobility  Bed Mobility Bed Mobility: No Transfers Sit to Stand: 4: Min assist;From chair/3-in-1 Sit to Stand Details (indicate cue type and reason): Husband able to assist pt. However, Pt unable to remember safe hand placement despite multiple cues and explanations.  Stand to Sit: 4: Min assist;To chair/3-in-1 Stand to Sit Details: VC for hand placement and assist to control descent  End of Session OT - End of Session Equipment Utilized During Treatment: Gait belt Activity Tolerance:  (Limited by DOE) Patient left: in chair;with call bell in reach;with family/visitor present General Behavior During Session: Agitated Cognition: Impaired Cognitive Impairment: Confused during session continually stating, "I am not buying that!"  Brevan Luberto  01/07/2012, 11:45 AM

## 2012-01-07 NOTE — Progress Notes (Signed)
Physical Therapy Treatment Patient Details Name: Paula [name] MRN: [id number] DOB: 1927-05-12 Today's Date: 01/07/2012  PT Assessment/Plan  PT - Assessment/Plan Comments on Treatment Session: Pt and husband continue to refuse SNF so the plan is to d/c home today. Focused our session today on education for husband as pt is confused and at risk for falls going home. Husband educated on wife's risk for falls and her need for 24 hour assist. He reports that he will NOT be able to provide 24 hour support when he goes for doctors appointments. The OT and myself expressed our concerns and made it clear that this would not be safe. He seems to understand but reports she can't reason wtih his wife when she makes her mind up. Spoke with SW who will discuss d/c options with the family. Husband also educated on how to fall proof their home.  PT Plan: Discharge plan remains appropriate;Frequency remains appropriate Follow Up Recommendations: Home health PT;Supervision/Assistance - 24 hour Equipment Recommended:  (already has been brought to room) PT Goals  Acute Rehab PT Goals PT Goal: Sit to Stand - Progress: Progressing toward goal PT Goal: Stand to Sit - Progress: Progressing toward goal PT Transfer Goal: Bed to Chair/Chair to Bed - Progress: Progressing toward goal PT Goal: Ambulate - Progress: Progressing toward goal  PT Treatment Precautions/Restrictions  Precautions Precautions: Fall Precaution Comments: confused Required Braces or Orthoses: No Restrictions Weight Bearing Restrictions: No Mobility (including Balance) Bed Mobility Bed Mobility: No (pt sitting upright in chair) Transfers Sit to Stand: 4: Min assist;With upper extremity assist;From chair/3-in-1 Sit to Stand Details (indicate cue type and reason): verbal cues for safe technique especially for husband to provide adequate hands on assist, proper hand placement and stabilizing RW if she is to pull on RW; ed husband about reminding  pt proper technique with RW/hand placement Stand to Sit: 4: Min assist;To chair/3-in-1 Stand to Sit Details: cues for proper hand placement and controlled descent although pt still with slight plop Ambulation/Gait Ambulation/Gait Assistance Details (indicate cue type and reason): amb approx 120 ft with mingaurdA from husband; v/cs for safety for both pt and husband with education concerning close proximity of husband to pt especially during turns; cues for pt to pick her feet up to reduce shuffling but pt not receptive Ambulation Distance (Feet): 120 Feet Assistive device: Rolling walker Gait Pattern: Trunk flexed;Shuffle Stairs: Yes Stairs Assistance Details (indicate cue type and reason): Brought single step into pt's room but pt adamantly refusing to practice this step stating that "Im not buying it." Pt obviously confused but unable to reason with her. Discussed safe technique with husband educating him about his need to closely gaurding her. He reports they will be fine.     Exercise    End of Session PT - End of Session Equipment Utilized During Treatment: Gait belt Activity Tolerance: Patient tolerated treatment well Patient left: in chair;with call bell in reach Nurse Communication: Mobility status for transfers;Mobility status for ambulation General Behavior During Session: Agitated Cognition: Impaired Cognitive Impairment: Unable to reason with pt concerning most tasks. She talked nonsense most of the day.   WHITLOW,Kamryn Messineo [name] 01/07/2012, 1:08 PM

## 2012-01-07 NOTE — Progress Notes (Addendum)
[facility] Progress Note  01/07/2012 7:30 AM POD 11  Subjective:  Wants to go home Pt still with persistent low grade fever of 99 for past 24 hours.  Filed Vitals:   01/07/12 0625  BP: 154/79  Pulse: 84  Temp: 99 F (37.2 C)  Resp: 20     Physical Exam: Cardiac:  RRR Lungs:  CTAB Abdomen:  Soft/NT/ND +BS; Incisions:  Abdominal and bilateral groin incisions are without drainage. No signs of infection Extremities:  Warm and well perfused.  CBC    Component Value Date/Time   WBC 15.7* 01/06/2012 0843   RBC 3.56* 01/06/2012 0843   HGB 11.0* 01/06/2012 0843   HCT 33.8* 01/06/2012 0843   PLT 325 01/06/2012 0843   MCV 94.9 01/06/2012 0843   MCH 30.9 01/06/2012 0843   MCHC 32.5 01/06/2012 0843   RDW 17.0* 01/06/2012 0843   LYMPHSABS 2.4 12/23/2011 0824   MONOABS 0.5 12/23/2011 0824   EOSABS 0.1 12/18/2011 1217   BASOSABS 0.1 12/18/2011 1217    BMET    Component Value Date/Time   NA 142 01/06/2012 0655   K 4.2 01/06/2012 0655   CL 112 01/06/2012 0655   CO2 23 01/06/2012 0655   GLUCOSE 104* 01/06/2012 0655   BUN 13 01/06/2012 0655   CREATININE 0.74 01/06/2012 0655   CREATININE 0.80 12/23/2011 0824   CALCIUM 8.4 01/06/2012 0655   GFRNONAA 77* 01/06/2012 0655   GFRAA 89* 01/06/2012 0655    INR    Component Value Date/Time   INR 1.93* 12/27/2011 1300     Intake/Output Summary (Last 24 hours) at 01/07/12 0730 Last data filed at 01/06/12 1230  Gross per 24 hour  Intake    240 ml  Output    651 ml  Net   -411 ml   Chest X-ray 01/06/12: IMPRESSION:  1. Improved aeration with small bilateral pleural effusions and  left retrocardiac consolidation / atelectasis.   Assessment/Plan:  78 y.o. female is s/p AAA repair  POD 11 -CXR yesterday revealed improved aeration with small bilateral effusions and left retrocardiac consolidation/atelectasis. -possible d/c later today.  Will d/w Dr. Hart Rochester -walked more yesterday and IS throughout the day. -needs social work for  [facility] nurse  Newton Pigg, PA-C [facility] [phone] 01/07/2012 7:30 AM

## 2012-01-07 NOTE — Progress Notes (Signed)
Removed 27 staples from mid abdomin (every other). Pt tolerated the procedure well without any distress.  Placed 1/2" steris at bedside for PA per MD.  Will continue to monitor.  Pt resting in chair with husband.  Call bell within reach. Thomas Hoff

## 2012-01-07 NOTE — Progress Notes (Signed)
Clinical Social Worker received consult for "discharge planning." CSW met with pt and husband. Pt appears to be confused at time, but is alert and oriented to person and place. CSW introduced herself and explained role of social work. CSW shared the concerns of PT/OT. CSW offered SNF placement. Pt's husband. Pt expressed that she would like to go home at discharge. CSW provided information on SNF, ALF, and Private Duty Care Agencies. CSW also explained the process of entering a SNF from home. Both pt and husband are agreeable to discharge home with home health services, that were arranged by [facility]. Pt's husband shared that they have adult children are able to help out if needed. Pt's husband also stated that he would be able provide care to his wife. CSW is signing off as no further needs identified. Please reconsult if a clinical social work need arises prior to discharge.   Dede Query, MSW, [facility]resia Majors [phone]

## 2012-01-08 ENCOUNTER — Telehealth: Payer: Self-pay | Admitting: Cardiology

## 2012-01-08 MED ORDER — FUROSEMIDE 20 MG PO TABS: 20.0000 mg | ORAL_TABLET | Freq: Every day | ORAL | Status: DC

## 2012-01-08 MED ORDER — HYDROCHLOROTHIAZIDE 25 MG PO TABS: 25.0000 mg | ORAL_TABLET | Freq: Every day | ORAL | Status: DC

## 2012-01-08 MED ORDER — POTASSIUM CHLORIDE ER 10 MEQ PO TBCR: 10.0000 meq | EXTENDED_RELEASE_TABLET | Freq: Every day | ORAL | Status: DC

## 2012-01-08 NOTE — Telephone Encounter (Signed)
Pt is calling to report fluid retention in her ankles (bil) and feet (bil).  She states she took her HCTZ this am and also took 20mg  of her husband's lasix after which she started voiding more.  She does not do daily weights because her scale broke.  BP yesterday was "high" but she doesn't remember what it was.  She was discharged from the hospital yesterday.  BP just now was 112/64.

## 2012-01-08 NOTE — Telephone Encounter (Signed)
Pt to take furosemide 20mg  and Potassium for 4 days per Dr Eden Emms.  Pt was notified of this and will call back if increased swelling and on Monday with a progress report.  Sooner if problems.

## 2012-01-08 NOTE — Telephone Encounter (Signed)
New Problem   Patient husband bobby [phone] concerned about patient meds, please return call on hm#. He can also be reached on cell number [phone].

## 2012-01-09 ENCOUNTER — Telehealth: Payer: Self-pay | Admitting: Cardiology

## 2012-01-09 NOTE — Telephone Encounter (Signed)
New Msg:Pt husband calling wanting to speak with nurse/Md regarding pt legs swelling and wanting to know if furosemide pt was recommended to take is going to be enough to take the fluid off.   Please return pt call to discuss further.

## 2012-01-09 NOTE — Telephone Encounter (Signed)
Pt wanted to make sure that the furosemide is for her fluid retention.  I told Mr Valdivia if the furosemide did not help the fluid retention to call us back.

## 2012-01-10 ENCOUNTER — Telehealth: Payer: Self-pay | Admitting: *Deleted

## 2012-01-10 NOTE — Telephone Encounter (Signed)
Returned call to [facility] regarding pt medications. She will continue to take HCTZ.  Pt has not been taking medications as prescribed.  Mylo Red RN

## 2012-01-13 ENCOUNTER — Telehealth: Payer: Self-pay | Admitting: Cardiology

## 2012-01-13 ENCOUNTER — Encounter: Payer: Self-pay | Admitting: Vascular Surgery

## 2012-01-13 NOTE — Telephone Encounter (Signed)
Patient husband [name] [phone]  Patient husband calling to report patient weight loss 5.2 lbs.  He can be reached at hm# until 10 am if you need to speak with him.

## 2012-01-13 NOTE — Telephone Encounter (Signed)
Mr Winbush relates that Mrs Cokley's fluid retention is much better.  Weight is down to 162.6#.

## 2012-01-14 ENCOUNTER — Ambulatory Visit (INDEPENDENT_AMBULATORY_CARE_PROVIDER_SITE_OTHER): Payer: Medicare Other | Admitting: Vascular Surgery

## 2012-01-14 ENCOUNTER — Encounter: Payer: Self-pay | Admitting: Vascular Surgery

## 2012-01-14 VITALS — BP 119/58 | HR 73 | Resp 20 | Ht 65.0 in | Wt 165.0 lb

## 2012-01-14 DIAGNOSIS — I714 Abdominal aortic aneurysm, without rupture: Secondary | ICD-10-CM

## 2012-01-14 NOTE — Progress Notes (Signed)
Subjective:     Patient ID: Paula [name], female   DOB: 1927-07-10, 78 y.o.   MRN: [id number]  HPI this 78 year old female had resection and grafting of a peri-renal aortic aneurysm by me on 12/27/2011. She had 5 renal arteries. This required ligation of her left renal vein. She has done well following her discharge from the hospital. She is increasing her ambulation and oral intake. She has had some edema in her lower extremities but that seems to be resolving.  Review of Systems     Objective:   Physical ExamBP 119/58  Pulse 73  Resp 20  Ht 5\' 5"  (1.651 m)  Wt 165 lb (74.844 kg)  BMI 27.46 kg/m2 General well-developed well-nourished female in no apparent distress alert and oriented x3    Abdomen soft nontender. Midline incision well healed. Skin staples were removed.  3+ femoral 1-2+ dorsalis pedis pulses bilaterally. 1+ edema on the right 2+ on the left. Assessment:     Doing well post resection and grafting of peri-renal abdominal aortic aneurysm    Plan:     Increase ambulation as tolerated Increased oral intake as tolerated Return to see me in 2 months we'll check ABIs and then have patient return in 9 months for final followup

## 2012-01-14 NOTE — Progress Notes (Signed)
Addended by: Sharee Pimple on: 01/14/2012 12:54 PM   Modules accepted: Orders

## 2012-01-22 NOTE — ED Provider Notes (Signed)
Neuro: A and O x 4, non-focal neuro exam with no CN deficits or abnormalities of strength or sensation.  During exam patient did begin having occasional twitches of the left side of her face that lasted moments.  She was conscious and maintaining her airway during these.  Patient was seen after she refused any testing here.  We discussed that I very much believed her symptoms and would be happy to check her electrolytes today.  Patient has had MRIs and CT scans for this and is followed by neurology for this.  She is to see neurology tomorrow.  Patient declined any testing but was felt safe for discharge home to see her neurologist tomorrow. Medical screening examination/treatment/procedure(s) were conducted as a shared visit with non-physician practitioner(s) and myself.  I personally evaluated the patient during the encounter  Cyndra Numbers, MD 01/22/12 [phone]

## 2012-02-12 ENCOUNTER — Telehealth: Payer: Self-pay

## 2012-02-12 NOTE — Telephone Encounter (Signed)
Phone call from pt.  C/o stomach hurting in mid abdomen, "above and below the waistline".  States discomfort has been going on x 2-3 days.  Also, states has had nausea, but no vomiting.  Denies abdominal distension. States having soft bowel movements, and that bowels have only been moving about every 3-4 days, since surgery.  Denies fever or chills.  Describes a feeling like there is a "fist or ball in my stomach".   Saw PCP today, and he prescribed Pantoprazole daily and Gas-Ex prn.  PCP also advised pt. to follow a liquid diet today.  Notified Dr. Hart Rochester.  Advised to have pt. added to Nurse Practitioner schedule tomorrow, and no vasc. Labs or xrays, until clinically evaluated.  Instructed pt of appt. 02/13/12 at 2:20pm.  Advised to go to ER tonight if increased abdominal pain, vomiting, fever/chills. Verb. Understanding.

## 2012-02-13 ENCOUNTER — Inpatient Hospital Stay (HOSPITAL_COMMUNITY): Payer: Medicare Other

## 2012-02-13 ENCOUNTER — Encounter: Payer: Self-pay | Admitting: Thoracic Diseases

## 2012-02-13 ENCOUNTER — Ambulatory Visit (INDEPENDENT_AMBULATORY_CARE_PROVIDER_SITE_OTHER): Payer: Medicare Other | Admitting: Thoracic Diseases

## 2012-02-13 ENCOUNTER — Inpatient Hospital Stay (HOSPITAL_COMMUNITY)
Admission: AD | Admit: 2012-02-13 | Discharge: 2012-02-17 | DRG: 390 | Disposition: A | Discharge status: Home / Self Care | Payer: Medicare Other | Source: Ambulatory Visit | Attending: Vascular Surgery | Admitting: Vascular Surgery

## 2012-02-13 VITALS — BP 145/82 | HR 108 | Temp 98.2°F | Resp 16 | Ht 65.0 in | Wt 152.0 lb

## 2012-02-13 DIAGNOSIS — E119 Type 2 diabetes mellitus without complications: Secondary | ICD-10-CM | POA: Diagnosis present

## 2012-02-13 DIAGNOSIS — R1310 Dysphagia, unspecified: Secondary | ICD-10-CM | POA: Diagnosis present

## 2012-02-13 DIAGNOSIS — Z8679 Personal history of other diseases of the circulatory system: Secondary | ICD-10-CM

## 2012-02-13 DIAGNOSIS — K56609 Unspecified intestinal obstruction, unspecified as to partial versus complete obstruction: Principal | ICD-10-CM | POA: Diagnosis present

## 2012-02-13 DIAGNOSIS — Z87891 Personal history of nicotine dependence: Secondary | ICD-10-CM

## 2012-02-13 DIAGNOSIS — H3530 Unspecified macular degeneration: Secondary | ICD-10-CM | POA: Diagnosis present

## 2012-02-13 DIAGNOSIS — R112 Nausea with vomiting, unspecified: Secondary | ICD-10-CM

## 2012-02-13 DIAGNOSIS — K219 Gastro-esophageal reflux disease without esophagitis: Secondary | ICD-10-CM | POA: Diagnosis present

## 2012-02-13 DIAGNOSIS — Z9889 Other specified postprocedural states: Secondary | ICD-10-CM

## 2012-02-13 DIAGNOSIS — J449 Chronic obstructive pulmonary disease, unspecified: Secondary | ICD-10-CM | POA: Diagnosis present

## 2012-02-13 DIAGNOSIS — J4489 Other specified chronic obstructive pulmonary disease: Secondary | ICD-10-CM | POA: Diagnosis present

## 2012-02-13 DIAGNOSIS — E039 Hypothyroidism, unspecified: Secondary | ICD-10-CM | POA: Diagnosis present

## 2012-02-13 DIAGNOSIS — R109 Unspecified abdominal pain: Secondary | ICD-10-CM

## 2012-02-13 LAB — COMPREHENSIVE METABOLIC PANEL
ALT: 11 U/L (ref 0–35)
Alkaline Phosphatase: 60 U/L (ref 39–117)
CO2: 28 mEq/L (ref 19–32)
GFR calc Af Amer: 76 mL/min — ABNORMAL LOW (ref >90)
GFR calc non Af Amer: 65 mL/min — ABNORMAL LOW (ref >90)
Glucose, Bld: 109 mg/dL — ABNORMAL HIGH (ref 70–99)
Potassium: 3.5 mEq/L (ref 3.5–5.1)
Sodium: 138 mEq/L (ref 135–145)

## 2012-02-13 LAB — AMYLASE: Amylase: 49 U/L (ref 0–105)

## 2012-02-13 LAB — CBC
Hemoglobin: 11.6 g/dL — ABNORMAL LOW (ref 12.0–15.0)
RBC: 3.75 MIL/uL — ABNORMAL LOW (ref 3.87–5.11)

## 2012-02-13 MED ORDER — FAMOTIDINE IN NACL 20-0.9 MG/50ML-% IV SOLN
20.0000 mg | Freq: Two times a day (BID) | INTRAVENOUS | Status: DC
  Administered 2012-02-13 – 2012-02-17 (×8): 20 mg via INTRAVENOUS
  Filled 2012-02-13 (×10): qty 50

## 2012-02-13 MED ORDER — METOPROLOL TARTRATE 1 MG/ML IV SOLN: 2.0000 mg | INTRAVENOUS | Status: DC | PRN

## 2012-02-13 MED ORDER — HYDRALAZINE HCL 20 MG/ML IJ SOLN
10.0000 mg | INTRAMUSCULAR | Status: DC | PRN
  Filled 2012-02-13: qty 0.5

## 2012-02-13 MED ORDER — ONDANSETRON HCL 4 MG/2ML IJ SOLN
4.0000 mg | Freq: Four times a day (QID) | INTRAMUSCULAR | Status: DC | PRN
  Administered 2012-02-14: 4 mg via INTRAVENOUS
  Filled 2012-02-13 (×2): qty 2

## 2012-02-13 MED ORDER — POTASSIUM CHLORIDE IN NACL 20-0.9 MEQ/L-% IV SOLN: INTRAVENOUS | Status: DC

## 2012-02-13 MED ORDER — LABETALOL HCL 5 MG/ML IV SOLN
10.0000 mg | INTRAVENOUS | Status: DC | PRN
  Filled 2012-02-13: qty 4

## 2012-02-13 MED ORDER — MORPHINE SULFATE 2 MG/ML IJ SOLN
2.0000 mg | INTRAMUSCULAR | Status: DC | PRN
  Administered 2012-02-13 – 2012-02-14 (×2): 2 mg via INTRAVENOUS
  Filled 2012-02-13 (×2): qty 1

## 2012-02-13 MED ORDER — PHENOL 1.4 % MT LIQD
1.0000 | OROMUCOSAL | Status: DC | PRN
  Filled 2012-02-13: qty 177

## 2012-02-13 MED ORDER — LEVOTHYROXINE SODIUM 100 MCG IV SOLR
87.5000 ug | Freq: Every day | INTRAVENOUS | Status: DC
  Administered 2012-02-14 – 2012-02-17 (×4): 87.5 ug via INTRAVENOUS
  Filled 2012-02-13 (×6): qty 4.4

## 2012-02-13 MED ORDER — KCL IN DEXTROSE-NACL 20-5-0.9 MEQ/L-%-% IV SOLN
INTRAVENOUS | Status: DC
  Administered 2012-02-13 – 2012-02-17 (×6): via INTRAVENOUS
  Filled 2012-02-13 (×11): qty 1000

## 2012-02-13 MED ORDER — POTASSIUM CHLORIDE CRYS ER 20 MEQ PO TBCR: 20.0000 meq | EXTENDED_RELEASE_TABLET | Freq: Every day | ORAL | Status: DC | PRN

## 2012-02-13 NOTE — Progress Notes (Signed)
 [facility] [place] ADMIT NOTE 02/13/2012 10/07/1927 [id number]  CC:Nausea/vomiting 6 weeks S/P AAA repair  History of Present Illness: Paula [name] is a 78 y.o. female who had a Open AAA repair by Dr. Lawson on 12/27/11. Pt has been doing OK at home with poor appetite, BM every 4-5 days. She has not been able to eat much after 3 pm without feeling sick. She had a large meal on Easter Sunday and did not feel well after that. She began to become more nauseated yesterday and has had several episodes of vomiting today. She also had several loose stools which were not bloody. She only had a small bowl of cereal this am but vomited a much larger amount of food. She denies fever or chills but had one episode of sweats. She states she has some pain in the epigastric area but denies a lot of abdominal pain.   Past Medical History  Diagnosis Date  . Diabetes mellitus   . COPD (chronic obstructive pulmonary disease)   . Hypertension   . Shortness of breath   . Hypothyroidism   . Depression   . Recurrent upper respiratory infection (URI)   . Seizures   . Ejection fraction     EF 40%, echo, November, 2012  / in improved, EF 60%, echo, December, 2012  . Abnormal EKG     November, 2012  . Contrast media allergy     Patient feels poorly with contrast  . AAA (abdominal aortic aneurysm)     10/2011 - 5.4cm - scheduled to f/u Dr. Lawson  . Pneumonia     10/2011  . Pre-syncope     vasovagal w/ heart block  . Parotid mass     has refused further eval 10/2011  . Chest pain at rest     Myoview 10/14/2011: ef 60%, ? dist anteroseptal scar w/ sublte peri-infarct ischemia vs. attenuation  . Hyperthyroidism   . CHF (congestive heart failure)   . GERD (gastroesophageal reflux disease)   . Headache   . Arthritis   . Anxiety   . Preop cardiovascular exam     Abdominal aortic aneurysm repair by Dr.Lawson  . PONV (postoperative nausea and vomiting)   . Facial tic     L sided  .  Macular degeneration     Past Surgical History  Procedure Date  . Cholecystectomy   . Joint replacement   . Sinus surgery with instatrak   . Appendectomy   . Oophorectomy   . Arthroscopic repair acl   . Eye surgery   . Cardiac catheterization 11/04/11  . Bladder repair   . Hernia repair     abdominal  . Us echocardiography 10/14/11  . Abdominal aortic aneurysm repair 12/27/2011    Procedure: ANEURYSM ABDOMINAL AORTIC REPAIR;  Surgeon: James Lawson, MD;  Location: [facility] OR;  Service: Vascular;  Laterality: N/A;  Resection and Grafting Abdominal Aortic Aneurysm , Aorta Bi Iliac.     ROS: [x] Positive  [ ] Denies    General: [ ] Weight loss, [ ] Fever, [ ] chills Neurologic: [ ] Dizziness, [ ] Blackouts, [ ] Seizure [ ] Stroke, [ ] "Mini stroke", [ ] Slurred speech, [ ] Temporary blindness; [ ] weakness in arms or legs, [ ] Hoarseness Cardiac: [ ] Chest pain/pressure, [ ] Shortness of breath at rest [x ] Shortness of breath with exertion, [ ] Atrial fibrillation or irregular heartbeat Vascular: [ ] Pain in legs with   walking, [ ] Pain in legs at rest, [ ] Pain in legs at night,  [ ] Non-healing ulcer, [ ] Blood clot in vein/DVT,   Pulmonary: [ ] Home oxygen, [ ] Productive cough, [ ] Coughing up blood, [ ] Asthma,  [ ] Wheezing; [x] COPD Musculoskeletal:  [ ] Arthritis, [ ] Low back pain, [ ] Joint pain Hematologic: [ ] Easy Bruising, [ ] Anemia; [ ] Hepatitis Gastrointestinal: [ ] Blood in stool, [ ] Gastroesophageal Reflux/heartburn, [ ] Trouble swallowing Urinary: [ ] chronic Kidney disease, [ ] on HD - [ ] MWF or [ ] TTHS, [ ] Burning with urination, [ ] Difficulty urinating Skin: [ ] Rashes, [ ] Wounds Psychological: [x ] Anxiety, [ ] Depression  Social History History  Substance Use Topics  . Smoking status: Former Smoker -- 1.0 packs/day for 65 years    Types: Cigarettes    Quit date: 08/31/2011  . Smokeless tobacco: Never Used  . Alcohol Use: No    Family  History Family History  Problem Relation Age of Onset  . Cancer Father     Allergies  Allergen Reactions  . Contrast Media (Iodinated Diagnostic Agents) Anaphylaxis  . Iohexol Shortness Of Breath and Swelling     Desc: PT STATES SHE HAD A SEVERE REACTION TO IV CONRAST WITH THROAT SWELLING AND SOB. SHE WAS ADMITTED TO THE HOSPITAL. SHE HAS NEVER HAD CONTRAST AGAIN.   . Penicillins Rash  . Sulfa Antibiotics Anaphylaxis    Current Outpatient Prescriptions  Medication Sig Dispense Refill  . ALPRAZolam (XANAX) 0.5 MG tablet Take 0.5 mg by mouth 3 (three) times daily as needed. For anxiety      . aspirin EC 81 MG tablet Take 81 mg by mouth daily.      . furosemide (LASIX) 20 MG tablet Take 1 tablet (20 mg total) by mouth daily.  4 tablet  0  . hydrochlorothiazide (HYDRODIURIL) 25 MG tablet Take 1 tablet (25 mg total) by mouth daily.  30 tablet  11  . levothyroxine (SYNTHROID, LEVOTHROID) 150 MCG tablet Take 150 mcg by mouth every morning.       . potassium chloride (K-DUR) 10 MEQ tablet Take 1 tablet (10 mEq total) by mouth daily.  4 tablet  0  . baclofen (LIORESAL) 10 MG tablet       . pantoprazole (PROTONIX) 40 MG tablet          Imaging: No results found.  Significant Diagnostic Studies: CBC Lab Results  Component Value Date   WBC 15.7* 01/06/2012   HGB 11.0* 01/06/2012   HCT 33.8* 01/06/2012   MCV 94.9 01/06/2012   PLT 325 01/06/2012    BMET    Component Value Date/Time   NA 142 01/06/2012 0655   K 4.2 01/06/2012 0655   CL 112 01/06/2012 0655   CO2 23 01/06/2012 0655   GLUCOSE 104* 01/06/2012 0655   BUN 13 01/06/2012 0655   CREATININE 0.74 01/06/2012 0655   CREATININE 0.80 12/23/2011 0824   CALCIUM 8.4 01/06/2012 0655   GFRNONAA 77* 01/06/2012 0655   GFRAA 89* 01/06/2012 0655    COAG Lab Results  Component Value Date   INR 1.93* 12/27/2011   INR 1.08 12/18/2011   INR 1.22 11/04/2011   No results found for this basename: PTT     Physical Examination  @IPVITALS@ Pulse  Readings from Last 3 Encounters:  02/13/12 108  01/14/12 73  01/07/12 130    General:    WDWN in NAD Gait: Normal HENT: WNL Eyes: Pupils equal Pulmonary: normal non-labored breathing , without Rales, rhonchi,  wheezing Cardiac: RRR, without  Murmurs, rubs or gallops; Abdomen: soft,no masses, wound well healed, mild distention, min tenderness LUQ Skin: no rashes, ulcers noted Vascular Exam/Pulses:2+ femoral pulses Positive DP pulses bilat  Extremities without ischemic changes, no Gangrene , no cellulitis; no open wounds;  Musculoskeletal: no muscle wasting or atrophy  Neurologic: A&O X 3; Appropriate Affect ;  SENSATION: normal; MOTOR FUNCTION:  moving all extremities equally.  Speech is fluent/normal  ASSESSMENT: Paula [name] is a 78 y.o. female; 6 weeks Open AAA and hx previous abdominal surgery with mesh with N/V and diarrhea;- Probable partial vs complete SBO  PLAN:  Admit to Hosp for hydration, W/U and bowel rest with NPO and NGT KUB,Flat and upright abd films Labs ordered 

## 2012-02-13 NOTE — Progress Notes (Addendum)
Pharmacy Note: Pharmacy asked to convert levothyroxine to IV equivalent dose while patient is NPO.    Home levothyroxine dose = po daily. IV dose is 50% of oral dose = 87.27mcg IV daily.  Change back to po daily once patient able to tolerate.  Pharmacy to sign off.  Re-consult as needed.  Thanks! Junita Push, PharmD, BCPS 02/13/2012 @ 4:53pm

## 2012-02-13 NOTE — H&P (Signed)
 [facility] [place] ADMIT NOTE 02/13/2012 11/07/1927 [id number]  CC:Nausea/vomiting 6 weeks S/P AAA repair  History of Present Illness: Paula [name] is a 78 y.o. female who had a Open AAA repair by Dr. Lawson on 12/27/11. Pt has been doing OK at home with poor appetite, BM every 4-5 days. She has not been able to eat much after 3 pm without feeling sick. She had a large meal on Easter Sunday and did not feel well after that. She began to become more nauseated yesterday and has had several episodes of vomiting today. She also had several loose stools which were not bloody. She only had a small bowl of cereal this am but vomited a much larger amount of food. She denies fever or chills but had one episode of sweats. She states she has some pain in the epigastric area but denies a lot of abdominal pain.   Past Medical History  Diagnosis Date  . Diabetes mellitus   . COPD (chronic obstructive pulmonary disease)   . Hypertension   . Shortness of breath   . Hypothyroidism   . Depression   . Recurrent upper respiratory infection (URI)   . Seizures   . Ejection fraction     EF 40%, echo, November, 2012  / in improved, EF 60%, echo, December, 2012  . Abnormal EKG     November, 2012  . Contrast media allergy     Patient feels poorly with contrast  . AAA (abdominal aortic aneurysm)     10/2011 - 5.4cm - scheduled to f/u Dr. Lawson  . Pneumonia     10/2011  . Pre-syncope     vasovagal w/ heart block  . Parotid mass     has refused further eval 10/2011  . Chest pain at rest     Myoview 10/14/2011: ef 60%, ? dist anteroseptal scar w/ sublte peri-infarct ischemia vs. attenuation  . Hyperthyroidism   . CHF (congestive heart failure)   . GERD (gastroesophageal reflux disease)   . Headache   . Arthritis   . Anxiety   . Preop cardiovascular exam     Abdominal aortic aneurysm repair by Dr.Lawson  . PONV (postoperative nausea and vomiting)   . Facial tic     L sided  .  Macular degeneration     Past Surgical History  Procedure Date  . Cholecystectomy   . Joint replacement   . Sinus surgery with instatrak   . Appendectomy   . Oophorectomy   . Arthroscopic repair acl   . Eye surgery   . Cardiac catheterization 11/04/11  . Bladder repair   . Hernia repair     abdominal  . Us echocardiography 10/14/11  . Abdominal aortic aneurysm repair 12/27/2011    Procedure: ANEURYSM ABDOMINAL AORTIC REPAIR;  Surgeon: James Lawson, MD;  Location: [facility] OR;  Service: Vascular;  Laterality: N/A;  Resection and Grafting Abdominal Aortic Aneurysm , Aorta Bi Iliac.     ROS: [x] Positive  [ ] Denies    General: [ ] Weight loss, [ ] Fever, [ ] chills Neurologic: [ ] Dizziness, [ ] Blackouts, [ ] Seizure [ ] Stroke, [ ] "Mini stroke", [ ] Slurred speech, [ ] Temporary blindness; [ ] weakness in arms or legs, [ ] Hoarseness Cardiac: [ ] Chest pain/pressure, [ ] Shortness of breath at rest [x ] Shortness of breath with exertion, [ ] Atrial fibrillation or irregular heartbeat Vascular: [ ] Pain in legs with   walking, [ ] Pain in legs at rest, [ ] Pain in legs at night,  [ ] Non-healing ulcer, [ ] Blood clot in vein/DVT,   Pulmonary: [ ] Home oxygen, [ ] Productive cough, [ ] Coughing up blood, [ ] Asthma,  [ ] Wheezing; [x] COPD Musculoskeletal:  [ ] Arthritis, [ ] Low back pain, [ ] Joint pain Hematologic: [ ] Easy Bruising, [ ] Anemia; [ ] Hepatitis Gastrointestinal: [ ] Blood in stool, [ ] Gastroesophageal Reflux/heartburn, [ ] Trouble swallowing Urinary: [ ] chronic Kidney disease, [ ] on HD - [ ] MWF or [ ] TTHS, [ ] Burning with urination, [ ] Difficulty urinating Skin: [ ] Rashes, [ ] Wounds Psychological: [x ] Anxiety, [ ] Depression  Social History History  Substance Use Topics  . Smoking status: Former Smoker -- 1.0 packs/day for 65 years    Types: Cigarettes    Quit date: 08/31/2011  . Smokeless tobacco: Never Used  . Alcohol Use: No    Family  History Family History  Problem Relation Age of Onset  . Cancer Father     Allergies  Allergen Reactions  . Contrast Media (Iodinated Diagnostic Agents) Anaphylaxis  . Iohexol Shortness Of Breath and Swelling     Desc: PT STATES SHE HAD A SEVERE REACTION TO IV CONRAST WITH THROAT SWELLING AND SOB. SHE WAS ADMITTED TO THE HOSPITAL. SHE HAS NEVER HAD CONTRAST AGAIN.   . Penicillins Rash  . Sulfa Antibiotics Anaphylaxis    Current Outpatient Prescriptions  Medication Sig Dispense Refill  . ALPRAZolam (XANAX) 0.5 MG tablet Take 0.5 mg by mouth 3 (three) times daily as needed. For anxiety      . aspirin EC 81 MG tablet Take 81 mg by mouth daily.      . furosemide (LASIX) 20 MG tablet Take 1 tablet (20 mg total) by mouth daily.  4 tablet  0  . hydrochlorothiazide (HYDRODIURIL) 25 MG tablet Take 1 tablet (25 mg total) by mouth daily.  30 tablet  11  . levothyroxine (SYNTHROID, LEVOTHROID) 150 MCG tablet Take 150 mcg by mouth every morning.       . potassium chloride (K-DUR) 10 MEQ tablet Take 1 tablet (10 mEq total) by mouth daily.  4 tablet  0  . baclofen (LIORESAL) 10 MG tablet       . pantoprazole (PROTONIX) 40 MG tablet          Imaging: No results found.  Significant Diagnostic Studies: CBC Lab Results  Component Value Date   WBC 15.7* 01/06/2012   HGB 11.0* 01/06/2012   HCT 33.8* 01/06/2012   MCV 94.9 01/06/2012   PLT 325 01/06/2012    BMET    Component Value Date/Time   NA 142 01/06/2012 0655   K 4.2 01/06/2012 0655   CL 112 01/06/2012 0655   CO2 23 01/06/2012 0655   GLUCOSE 104* 01/06/2012 0655   BUN 13 01/06/2012 0655   CREATININE 0.74 01/06/2012 0655   CREATININE 0.80 12/23/2011 0824   CALCIUM 8.4 01/06/2012 0655   GFRNONAA 77* 01/06/2012 0655   GFRAA 89* 01/06/2012 0655    COAG Lab Results  Component Value Date   INR 1.93* 12/27/2011   INR 1.08 12/18/2011   INR 1.22 11/04/2011   No results found for this basename: PTT     Physical Examination  @IPVITALS@ Pulse  Readings from Last 3 Encounters:  02/13/12 108  01/14/12 73  01/07/12 130    General:    WDWN in NAD Gait: Normal HENT: WNL Eyes: Pupils equal Pulmonary: normal non-labored breathing , without Rales, rhonchi,  wheezing Cardiac: RRR, without  Murmurs, rubs or gallops; Abdomen: soft,no masses, wound well healed, mild distention, min tenderness LUQ Skin: no rashes, ulcers noted Vascular Exam/Pulses:2+ femoral pulses Positive DP pulses bilat  Extremities without ischemic changes, no Gangrene , no cellulitis; no open wounds;  Musculoskeletal: no muscle wasting or atrophy  Neurologic: A&O X 3; Appropriate Affect ;  SENSATION: normal; MOTOR FUNCTION:  moving all extremities equally.  Speech is fluent/normal  ASSESSMENT: Paula [name] is a 78 y.o. female; 6 weeks Open AAA and hx previous abdominal surgery with mesh with N/V and diarrhea;- Probable partial vs complete SBO  PLAN:  Admit to Hosp for hydration, W/U and bowel rest with NPO and NGT KUB,Flat and upright abd films Labs ordered 

## 2012-02-13 NOTE — H&P (Signed)
[facility] [place] ADMIT NOTE 02/13/2012 [id number]  [id number] 6 weeks S/P AAA repair  History of Present Illness: Paula [name] is a 78 y.o. female who had a Open AAA repair by Dr. Hart Rochester on 12/27/11. Pt has been doing OK at home with poor appetite, BM every 4-5 days. She has not been able to eat much after 3 pm without feeling sick. She had a large meal on Easter Sunday and did not feel well after that. She began to become more nauseated yesterday and has had several episodes of vomiting today. She also had several loose stools which were not bloody. She only had a small bowl of cereal this am but vomited a much larger amount of food. She denies fever or chills but had one episode of sweats. She states she has some pain in the epigastric area but denies a lot of abdominal pain.   Past Medical History  Diagnosis Date  . Diabetes mellitus   . COPD (chronic obstructive pulmonary disease)   . Hypertension   . Shortness of breath   . Hypothyroidism   . Depression   . Recurrent upper respiratory infection (URI)   . Seizures   . Ejection fraction     EF 40%, echo, November, 2012  / in improved, EF 60%, echo, December, 2012  . Abnormal EKG     November, 2012  . Contrast media allergy     Patient feels poorly with contrast  . AAA (abdominal aortic aneurysm)     10/2011 - 5.4cm - scheduled to f/u Dr. Lawson  . Pneumonia     12 /2012  . Pre-syncope     vasovagal w/ heart block  . Parotid mass     has refused further eval 10/2011  . Chest pain at rest     Myoview 10/14/2011: ef 60%, ? dist anteroseptal scar w/ sublte peri-infarct ischemia vs. attenuation  . Hyperthyroidism   . CHF (congestive heart failure)   . GERD (gastroesophageal reflux disease)   . Headache   . Arthritis   . Anxiety   . Preop cardiovascular exam     Abdominal aortic aneurysm repair by Dr.Lawson  . PONV (postoperative nausea and vomiting)   . Facial tic     L sided  .  Macular degeneration     Past Surgical History  Procedure Date  . Cholecystectomy   . Joint replacement   . Sinus surgery with instatrak   . Appendectomy   . Oophorectomy   . Arthroscopic repair acl   . Eye surgery   . Cardiac catheterization 11/04/11  . Bladder repair   . Hernia repair     abdominal  . US echocardiography 10/14/11  . Abdominal aortic aneurysm repair 12/27/2011    Procedure: ANEURYSM ABDOMINAL AORTIC REPAIR;  Surgeon: Josephina Gip, MD;  Location: [facility] OR;  Service: Vascular;  Laterality: N/A;  Resection and Grafting Abdominal Aortic Aneurysm , Aorta Bi Iliac.     ROS: [x]  Positive  [ ]  Denies    General: [ ]  Weight loss, [ ]  Fever, [ ]  chills Neurologic: [ ]  Dizziness, [ ]  Blackouts, [ ]  Seizure [ ]  Stroke, [ ]  "Mini stroke", [ ]  Slurred speech, [ ]  Temporary blindness; [ ]  weakness in arms or legs, [ ]  Hoarseness Cardiac: [ ]  Chest pain/pressure, [ ]  Shortness of breath at rest [x ] Shortness of breath with exertion, [ ]  Atrial fibrillation or irregular heartbeat Vascular: [ ]  Pain in legs with  walking, [ ]  Pain in legs at rest, [ ]  Pain in legs at night,  [ ]  Non-healing ulcer, [ ]  Blood clot in vein/DVT,   Pulmonary: [ ]  Home oxygen, [ ]  Productive cough, [ ]  Coughing up blood, [ ]  Asthma,  [ ]  Wheezing; [x]  COPD Musculoskeletal:  [ ]  Arthritis, [ ]  Low back pain, [ ]  Joint pain Hematologic: [ ]  Easy Bruising, [ ]  Anemia; [ ]  Hepatitis Gastrointestinal: [ ]  Blood in stool, [ ]  Gastroesophageal Reflux/heartburn, [ ]  Trouble swallowing Urinary: [ ]  chronic Kidney disease, [ ]  on HD - [ ]  MWF or [ ]  TTHS, [ ]  Burning with urination, [ ]  Difficulty urinating Skin: [ ]  Rashes, [ ]  Wounds Psychological: [x ] Anxiety, [ ]  Depression  Social History History  Substance Use Topics  . Smoking status: Former Smoker -- 1.0 packs/day for 65 years    Types: Cigarettes    Quit date: 08/31/2011  . Smokeless tobacco: Never Used  . Alcohol Use: No    Family  History Family History  Problem Relation Age of Onset  . Cancer Father     Allergies  Allergen Reactions  . Contrast Media (Iodinated Diagnostic Agents) Anaphylaxis  . Iohexol Shortness Of Breath and Swelling     Desc: PT STATES SHE HAD A SEVERE REACTION TO IV CONRAST WITH THROAT SWELLING AND SOB. SHE WAS ADMITTED TO THE HOSPITAL. SHE HAS NEVER HAD CONTRAST AGAIN.   Marland Kitchen Penicillins Rash  . Sulfa Antibiotics Anaphylaxis    Current Outpatient Prescriptions  Medication Sig Dispense Refill  . ALPRAZolam (XANAX) 0.5 MG tablet Take 0.5 mg by mouth 3 (three) times daily as needed. For anxiety      . aspirin EC 81 MG tablet Take 81 mg by mouth daily.      . furosemide (LASIX) 20 MG tablet Take 1 tablet (20 mg total) by mouth daily.  4 tablet  0  . hydrochlorothiazide (HYDRODIURIL) 25 MG tablet Take 1 tablet (25 mg total) by mouth daily.  30 tablet  11  . levothyroxine (SYNTHROID, LEVOTHROID) 150 MCG tablet Take 150 mcg by mouth every morning.       . potassium chloride (K-DUR) 10 MEQ tablet Take 1 tablet (10 mEq total) by mouth daily.  4 tablet  0  . baclofen (LIORESAL) 10 MG tablet       . pantoprazole (PROTONIX) 40 MG tablet          Imaging: No results found.  Significant Diagnostic Studies: CBC Lab Results  Component Value Date   WBC 15.7* 01/06/2012   HGB 11.0* 01/06/2012   HCT 33.8* 01/06/2012   MCV 94.9 01/06/2012   PLT 325 01/06/2012    BMET    Component Value Date/Time   NA 142 01/06/2012 0655   K 4.2 01/06/2012 0655   CL 112 01/06/2012 0655   CO2 23 01/06/2012 0655   GLUCOSE 104* 01/06/2012 0655   BUN 13 01/06/2012 0655   CREATININE 0.74 01/06/2012 0655   CREATININE 0.80 12/23/2011 0824   CALCIUM 8.4 01/06/2012 0655   GFRNONAA 77* 01/06/2012 0655   GFRAA 89* 01/06/2012 0655    COAG Lab Results  Component Value Date   INR 1.93* 12/27/2011   INR 1.08 12/18/2011   INR 1.22 11/04/2011   No results found for this basename: PTT     Physical Examination  @IPVITALS @ Pulse  Readings from Last 3 Encounters:  02/13/12 108  01/14/12 73  01/07/12 130    General:  WDWN in NAD Gait: Normal HENT: WNL Eyes: Pupils equal Pulmonary: normal non-labored breathing , without Rales, rhonchi,  wheezing Cardiac: RRR, without  Murmurs, rubs or gallops; Abdomen: soft,no masses, wound well healed, mild distention, min tenderness LUQ Skin: no rashes, ulcers noted Vascular Exam/Pulses:2+ femoral pulses Positive DP pulses bilat  Extremities without ischemic changes, no Gangrene , no cellulitis; no open wounds;  Musculoskeletal: no muscle wasting or atrophy  Neurologic: A&O X 3; Appropriate Affect ;  SENSATION: normal; MOTOR FUNCTION:  moving all extremities equally.  Speech is fluent/normal  ASSESSMENT: Paula [name] is a 78 y.o. female; 6 weeks Open AAA and hx previous abdominal surgery with mesh with N/V and diarrhea;- Probable partial vs complete SBO  PLAN:  Admit to [facility] for hydration, W/U and bowel rest with NPO and NGT KUB,Flat and upright abd films Labs ordered

## 2012-02-14 ENCOUNTER — Encounter (HOSPITAL_COMMUNITY): Payer: Self-pay | Admitting: General Practice

## 2012-02-14 MED ORDER — ACETAMINOPHEN 10 MG/ML IV SOLN
1000.0000 mg | Freq: Four times a day (QID) | INTRAVENOUS | Status: AC
  Administered 2012-02-14 (×2): 1000 mg via INTRAVENOUS
  Filled 2012-02-14 (×4): qty 100

## 2012-02-14 MED ORDER — METOCLOPRAMIDE HCL 5 MG/ML IJ SOLN
10.0000 mg | Freq: Four times a day (QID) | INTRAMUSCULAR | Status: DC
  Administered 2012-02-14 – 2012-02-17 (×12): 10 mg via INTRAVENOUS
  Filled 2012-02-14 (×16): qty 2

## 2012-02-14 MED ORDER — ACETAMINOPHEN 650 MG RE SUPP: 325.0000 mg | RECTAL | Status: DC | PRN

## 2012-02-14 NOTE — Progress Notes (Addendum)
VASCULAR AND VEIN SURGERY PROGRESS NOTE  Progress note    HPI: Paula [name] is a 78 y.o. female who had a Open AAA repair by Dr. Hart Rochester on 12/27/11. Pt has been doing OK at home with poor appetite, BM every 4-5 days. She has not been able to eat much after 3 pm without feeling sick. She had a large meal on Easter Sunday and did not feel well after that. She began to become more nauseated yesterday and has had several episodes of vomiting today. She also had several loose stools which were not bloody. She only had a small bowl of cereal this am but vomited a much larger amount of food. She denies fever or chills but had one episode of sweats. She states she has some pain in the epigastric area but denies a lot of abdominal pain.  Currently she is NPO and complains of nausea.  Abd. Xray  IMPRESSION:  Nonspecific bowel gas pattern without plain film evidence of  obstruction or gross free intraperitoneal air.  Calcified aorta. Caliber of the aorta not adequately assessed.  Cardiomegaly.    Significant Diagnostic Studies: CBC    Component Value Date/Time   WBC 12.4* 02/13/2012 1810   RBC 3.75* 02/13/2012 1810   HGB 11.6* 02/13/2012 1810   HCT 35.8* 02/13/2012 1810   PLT 297 02/13/2012 1810   MCV 95.5 02/13/2012 1810   MCH 30.9 02/13/2012 1810   MCHC 32.4 02/13/2012 1810   RDW 17.5* 02/13/2012 1810   LYMPHSABS 2.4 12/23/2011 0824   MONOABS 0.5 12/23/2011 0824   EOSABS 0.1 12/18/2011 1217   BASOSABS 0.1 12/18/2011 1217    BMET    Component Value Date/Time   NA 138 02/13/2012 1810   K 3.5 02/13/2012 1810   CL 100 02/13/2012 1810   CO2 28 02/13/2012 1810   GLUCOSE 109* 02/13/2012 1810   BUN 12 02/13/2012 1810   CREATININE 0.81 02/13/2012 1810   CREATININE 0.80 12/23/2011 0824   CALCIUM 9.9 02/13/2012 1810   GFRNONAA 65* 02/13/2012 1810   GFRAA 76* 02/13/2012 1810    COAG Lab Results  Component Value Date   INR 1.10 02/13/2012   INR 1.93* 12/27/2011   INR 1.08 12/18/2011   No results found for this basename: PTT       I/O last 3 completed shifts: In: -  Out: 150 [Urine:150]  Physical Examination  Patient Vitals for the past 24 hrs:  BP Temp Temp src Pulse Resp SpO2  02/14/12 0505 137/72 mmHg 98.4 F (36.9 C) Oral 92  18  91 %  02/13/12 2139 113/69 mmHg 99 F (37.2 C) Oral 95  18  93 %  02/13/12 1603 129/82 mmHg 98.9 F (37.2 C) Oral 101  16  97 %    Pt Incision is healing well, skin color is normal on the abdomin s/p AAA. Abdomin is soft NTTP and bowel sounds.  Assessment/Plan  Paula [name] is a 78 y.o. year old female who is S/P AAA 12-27-2011 with N/V and abdominal fullness feeling post prandial.  NPO for bowel rest until further work up is completed. Clinton Gallant [facility] 02/14/2012 7:51 AM   I have D/C'd the morphine and given  Her tylenol PRN IV for HA. I D/C'd the NG tube order. We will re access her tomorrow after 24 hour bowel rest and then decide if she can tolerate a clear liquid diet. The patient told me she has difficulty swallowing and feels like she has constant drainage  in her throat.

## 2012-02-15 LAB — URINE MICROSCOPIC-ADD ON

## 2012-02-15 LAB — URINALYSIS, ROUTINE W REFLEX MICROSCOPIC
Glucose, UA: NEGATIVE mg/dL
Hgb urine dipstick: NEGATIVE
Ketones, ur: NEGATIVE mg/dL
Protein, ur: NEGATIVE mg/dL

## 2012-02-15 NOTE — Progress Notes (Addendum)
[facility] [place]  Post-op  Intra-abdominal Surgery note  Date of Surgery: 12/27/11 open AAA repair Surgeon: Andres Labrum    History of Present Illness  Paula [name] is a 78 y.o. female who is  up s/p open AAA repair 6 weeks ago who is admitted with N/V, no abd pain.  Pt is doing well. denies abdominal pain; denies nausea/vomiting; denies diarrhea. has not had flatus;has had BM  IMAGING: Dg Abd 2 Views  02/13/2012  *RADIOLOGY REPORT*  Clinical Data: Nausea and vomiting.  Recent abdominal surgery 2 months ago.  ABDOMEN - 2 VIEW  Comparison: 12/30/2010.  Findings: Prior hernia repair. Calcification of the aorta. Evaluation of the aorta is limited by plain film exam.  Bowel gas pattern is nonspecific.  The upright view may not be completely upright and therefore limits detection for small amount of free intraperitoneal air.  No gross free intraperitoneal air noted.  Cardiomegaly.  IMPRESSION: Nonspecific bowel gas pattern without plain film evidence of obstruction or gross free intraperitoneal air.  Calcified aorta.  Caliber of the aorta not adequately assessed.  Cardiomegaly.  Original Report Authenticated By: Fuller Canada, M.D.    Significant Diagnostic Studies: CBC Lab Results  Component Value Date   WBC 12.4* 02/13/2012   HGB 11.6* 02/13/2012   HCT 35.8* 02/13/2012   MCV 95.5 02/13/2012   PLT 297 02/13/2012    BMET    Component Value Date/Time   NA 138 02/13/2012 1810   K 3.5 02/13/2012 1810   CL 100 02/13/2012 1810   CO2 28 02/13/2012 1810   GLUCOSE 109* 02/13/2012 1810   BUN 12 02/13/2012 1810   CREATININE 0.81 02/13/2012 1810   CREATININE 0.80 12/23/2011 0824   CALCIUM 9.9 02/13/2012 1810   GFRNONAA 65* 02/13/2012 1810   GFRAA 76* 02/13/2012 1810    COAG Lab Results  Component Value Date   INR 1.10 02/13/2012   INR 1.93* 12/27/2011   INR 1.08 12/18/2011   No results found for this basename: PTT    I/O last 3 completed shifts: In: 0  Out: 1050 [Urine:1050] Total  I/O In: 0  Out: 200 [Urine:200]  Physical Examination BP Readings from Last 3 Encounters:  02/15/12 138/90  02/13/12 145/82  01/14/12 119/58   Temp Readings from Last 3 Encounters:  02/15/12 98.3 F (36.8 C) Oral  02/13/12 98.2 F (36.8 C) Oral  01/07/12 99 F (37.2 C) Oral   SpO2 Readings from Last 3 Encounters:  02/15/12 98%  02/13/12 98%  01/07/12 90%   Pulse Readings from Last 3 Encounters:  02/15/12 95  02/13/12 108  01/14/12 73    General: A&O x 3, WDWN female in NAD  Abdomen:abdomen soft, non-tender and normal active bowel sounds Abdominal wound:clean, dry, intact  Neurologic: A&O X 3; Appropriate Affect ; SENSATION: normal; MOTOR FUNCTION:  moving all extremities equally. Speech is fluent/normal Vascular Exam:BLE warm and well perfused Extremities without ischemic changes, no Gangrene , no cellulitis; no open wounds;   Assessment/Plan: Paula [name] is a 78 y.o. female who is 6 weeks S/P open AAA with previous abd surgery as well who has had N/V with possible motility issues  Had normal BM this am. Continue reglan Begin CL diet Ambulate  Marlowe Shores [phone] 02/15/2012 8:42 AM  Addendum  I have independently interviewed and examined the patient, and I agree with the physician assistant's findings.  Abd films have never demonstrated an evidence of pSBO.  With BM today, I  doubt any bowel obstruction.  Pt's nausea improved.  Pt's sx are more consistent with possible esophageal dysmotility.  Will start clears today and advance as tolerated.  If pt is not able to advance diet, will get GI involved in the inpt setting for further work-up.  Leonides Sake, MD [facility] [place] Office: [phone] Pager: [phone]  02/15/2012, 9:46 AM

## 2012-02-16 ENCOUNTER — Other Ambulatory Visit: Payer: Self-pay

## 2012-02-16 LAB — BASIC METABOLIC PANEL
CO2: 20 mEq/L (ref 19–32)
Chloride: 106 mEq/L (ref 96–112)
Sodium: 138 mEq/L (ref 135–145)

## 2012-02-16 NOTE — Progress Notes (Signed)
[facility] [place]  Daily Progress Note  Assessment/Planning: S/p Aortobifem BPG   Cont esophageal sx: GI c/s tomorrow likely indicated to evaluate  Continue diet  Subjective   +BM this AM also, continued nausea and difficulty eating due to sensation in throat  Objective Filed Vitals:   02/15/12 1145 02/15/12 1500 02/15/12 2028 02/16/12 0520  BP:  140/90 145/84 153/85  Pulse:  92 99 73  Temp:  98.1 F (36.7 C) 98.9 F (37.2 C) 98.4 F (36.9 C)  TempSrc:   Oral Oral  Resp:  18 20 20   Height: 5\' 5"  (1.651 m)     Weight: 158 lb 11.7 oz (72 kg)     SpO2:  98% 97% 96%    Intake/Output Summary (Last 24 hours) at 02/16/12 0850 Last data filed at 02/16/12 0700  Gross per 24 hour  Intake    120 ml  Output    550 ml  Net   -430 ml    PULM  CTAB CV  RRR GI  soft, NTND, incision healed, +BS VASC  Both feet well perfused  Laboratory CBC    Component Value Date/Time   WBC 12.4* 02/13/2012 1810   HGB 11.6* 02/13/2012 1810   HCT 35.8* 02/13/2012 1810   PLT 297 02/13/2012 1810    BMET    Component Value Date/Time   NA 138 02/16/2012 0540   K 3.9 02/16/2012 0540   CL 106 02/16/2012 0540   CO2 20 02/16/2012 0540   GLUCOSE 103* 02/16/2012 0540   BUN 4* 02/16/2012 0540   CREATININE 0.63 02/16/2012 0540   CREATININE 0.80 12/23/2011 0824   CALCIUM 9.2 02/16/2012 0540   GFRNONAA 81* 02/16/2012 0540   GFRAA >90 02/16/2012 0540    Leonides Sake, MD [facility] [place] Office: [phone] Pager: [phone]  02/16/2012, 8:50 AM

## 2012-02-16 NOTE — Progress Notes (Signed)
Pt has been very disoriented throughout the night. Speech can be rambling and difficult to follow. Pt can get agitated. Pt has been picking at the bed linens and sometimes tries to get out of bed. Bed alarm on. Husband in room.  Alfonso Ellis, RN

## 2012-02-17 ENCOUNTER — Inpatient Hospital Stay (HOSPITAL_COMMUNITY): Payer: Medicare Other

## 2012-02-17 LAB — TSH: TSH: 2.654 u[IU]/mL (ref 0.350–4.500)

## 2012-02-17 MED ORDER — BOOST / RESOURCE BREEZE PO LIQD: 1.0000 | Freq: Two times a day (BID) | ORAL | Status: DC | PRN

## 2012-02-17 NOTE — Consult Note (Signed)
Reason for Consult: Dysphagia Referring Physician: [name] is an 78 y.o. female.   HPI: Patient is an 19 year woman with past history of hypertension, seizures, hypothyroidism, GERD,  recent AAA repair about 6 weeks ago- is admitted to hospital for nausea, vomiting and difficulty swallowing. Patient and husband also reports having one loose stool everyday since admission- but no frequent stools. Patient is unclear about her swallowing issues of present- but says that she does not have any pain with swallowing. Is unclear if she has trouble initiating swallowing or has a sensation of food/liquid sticking in the throat. Husband also unclear of present problem of swallowing. Did not have any dysphagia evaluation before. Husband does report patient having twitching/tic the left side of face for long time and is followed by her doctor as outpatient. Did not have any stroke in past. No significant weight loss in past 6 months. She has 65 year smoking history- quit recently. Has no vomiting since admission.  Past Medical History  Diagnosis Date  . Diabetes mellitus   . COPD (chronic obstructive pulmonary disease)   . Hypertension   . Shortness of breath   . Hypothyroidism   . Depression   . Recurrent upper respiratory infection (URI)   . Seizures   . Ejection fraction     EF 40%, echo, November, 2012  / in improved, EF 60%, echo, December, 2012  . Abnormal EKG     November, 2012  . Contrast media allergy     Patient feels poorly with contrast  . AAA (abdominal aortic aneurysm)     10/2011 - 5.4cm - scheduled to f/u Dr. Hart Rochester  . Pneumonia     10/2011  . Pre-syncope     vasovagal w/ heart block  . Parotid mass     has refused further eval 10/2011  . Chest pain at rest     Myoview 10/14/2011: ef 60%, ? dist anteroseptal scar w/ sublte peri-infarct ischemia vs. attenuation  . Hyperthyroidism   . CHF (congestive heart failure)   . GERD (gastroesophageal reflux  disease)   . Headache   . Arthritis   . Anxiety   . Preop cardiovascular exam     Abdominal aortic aneurysm repair by Dr.Lawson  . PONV (postoperative nausea and vomiting)   . Facial tic     L sided  . Macular degeneration     Past Surgical History  Procedure Date  . Cholecystectomy   . Joint replacement   . Sinus surgery with instatrak   . Appendectomy   . Oophorectomy   . Arthroscopic repair acl   . Eye surgery   . Cardiac catheterization 11/04/11  . Bladder repair   . Hernia repair     abdominal  . US echocardiography 10/14/11  . Abdominal aortic aneurysm repair 12/27/2011    Procedure: ANEURYSM ABDOMINAL AORTIC REPAIR;  Surgeon: Josephina Gip, MD;  Location: [facility] OR;  Service: Vascular;  Laterality: N/A;  Resection and Grafting Abdominal Aortic Aneurysm , Aorta Bi Iliac.    Family History  Problem Relation Age of Onset  . Cancer Father     Social History:  reports that she quit smoking about 5 months ago. Her smoking use included Cigarettes. She has a 65 pack-year smoking history. She has never used smokeless tobacco. She reports that she does not drink alcohol or use illicit drugs.  Allergies:  Allergies  Allergen Reactions  . Contrast Media (Iodinated Diagnostic Agents) Anaphylaxis  . Iohexol Shortness  Of Breath and Swelling     Desc: PT STATES SHE HAD A SEVERE REACTION TO IV CONRAST WITH THROAT SWELLING AND SOB. SHE WAS ADMITTED TO THE HOSPITAL. SHE HAS NEVER HAD CONTRAST AGAIN.   Marland Kitchen Penicillins Rash  . Sulfa Antibiotics Anaphylaxis    Medications: I have reviewed the patient's current medications.  Results for orders placed during the hospital encounter of 02/13/12 (from the past 48 hour(s))  URINALYSIS, ROUTINE W REFLEX MICROSCOPIC     Status: Abnormal   Collection Time   02/15/12 11:37 AM      Component Value Range Comment   Color, Urine YELLOW  YELLOW     APPearance CLEAR  CLEAR     Specific Gravity, Urine 1.024  1.005 - 1.030     pH 6.5  5.0 - 8.0      Glucose, UA NEGATIVE  NEGATIVE (mg/dL)    Hgb urine dipstick NEGATIVE  NEGATIVE     Bilirubin Urine NEGATIVE  NEGATIVE     Ketones, ur NEGATIVE  NEGATIVE (mg/dL)    Protein, ur NEGATIVE  NEGATIVE (mg/dL)    Urobilinogen, UA 1.0  0.0 - 1.0 (mg/dL)    Nitrite NEGATIVE  NEGATIVE     Leukocytes, UA MODERATE (*) NEGATIVE    URINE MICROSCOPIC-ADD ON     Status: Normal   Collection Time   02/15/12 11:37 AM      Component Value Range Comment   Squamous Epithelial / LPF RARE  RARE     WBC, UA 11-20  <3 (WBC/hpf)   BASIC METABOLIC PANEL     Status: Abnormal   Collection Time   02/16/12  5:40 AM      Component Value Range Comment   Sodium 138  135 - 145 (mEq/L)    Potassium 3.9  3.5 - 5.1 (mEq/L)    Chloride 106  96 - 112 (mEq/L)    CO2 20  19 - 32 (mEq/L)    Glucose, Bld 103 (*) 70 - 99 (mg/dL)    BUN 4 (*) 6 - 23 (mg/dL)    Creatinine, Ser 1.61  0.50 - 1.10 (mg/dL)    Calcium 9.2  8.4 - 10.5 (mg/dL)    GFR calc non Af Amer 81 (*) >90 (mL/min)    GFR calc Af Amer >90  >90 (mL/min)     No results found.  ROS:  As stated above in the HPI otherwise negative.  Blood pressure 157/88, pulse 98, temperature 98.8 F (37.1 C), temperature source Oral, resp. rate 19, height 5\' 5"  (1.651 m), weight 72 kg (158 lb 11.7 oz), SpO2 98.00%.  PE: Gen: NAD, dysarthria and mild confusion. HEENT:  [place]/AT, EOMI Neck: Supple, no LAD Lungs: CTA Bilaterally CV: S1, S2, RRR without M/G/R ABM: Soft, NTND, +BS. Surgical wound healing well. Ext: No C/C/E  Assessment/Plan:  # Dysphagia: Unclear if oropharyngeal or esophageal dysphagia. Per husband- patient had intermittent swallowing problems before surgery- but did not have any swallow evaluation done. Patient husband does not remember having any upper endoscopy done in past. Does report having history of reflux disease but denies any peptic ulcer disease history. Unclear from history is trouble initiating swallowing or sticky sensation after swallowing. -  Speech pathology evaluation to help differentiate between oropharyngeal versus esophageal dysphagia - Continue with barium swallow evaluation and follow with results- further recommendations pending after speech evaluation and barium swallow. - Will check TSH-  last result 16 in December 2012. Low thyroid might be contributing to dysphagia- if  any.  # Recent AAA repair: Management per primary team. Patient doing well. # Left facial tic: Followed in outpatient. Might be related to her dysphagia if its oropharyngeal.  PATEL,RAVI 02/17/2012, 10:36 AM  Patient left AMA-before her barium swallow could be done. No charge generated for this consult.

## 2012-02-17 NOTE — Progress Notes (Addendum)
  VASCULAR AND VEIN SURGERY PROGRESS NOTE  Progress note  Date of Surgery: * No surgery found *    HPI: Paula [name] is a 78 y.o. female who is up s/p open AAA repair 6 weeks ago who is admitted with N/V, no abd pain. Pt is doing well. denies abdominal pain; denies nausea/vomiting; denies diarrhea. has not had flatus;has had BM    Significant Diagnostic Studies: CBC    Component Value Date/Time   WBC 12.4* 02/13/2012 1810   RBC 3.75* 02/13/2012 1810   HGB 11.6* 02/13/2012 1810   HCT 35.8* 02/13/2012 1810   PLT 297 02/13/2012 1810   MCV 95.5 02/13/2012 1810   MCH 30.9 02/13/2012 1810   MCHC 32.4 02/13/2012 1810   RDW 17.5* 02/13/2012 1810   LYMPHSABS 2.4 12/23/2011 0824   MONOABS 0.5 12/23/2011 0824   EOSABS 0.1 12/18/2011 1217   BASOSABS 0.1 12/18/2011 1217    BMET    Component Value Date/Time   NA 138 02/16/2012 0540   K 3.9 02/16/2012 0540   CL 106 02/16/2012 0540   CO2 20 02/16/2012 0540   GLUCOSE 103* 02/16/2012 0540   BUN 4* 02/16/2012 0540   CREATININE 0.63 02/16/2012 0540   CREATININE 0.80 12/23/2011 0824   CALCIUM 9.2 02/16/2012 0540   GFRNONAA 81* 02/16/2012 0540   GFRAA >90 02/16/2012 0540    COAG Lab Results  Component Value Date   INR 1.10 02/13/2012   INR 1.93* 12/27/2011   INR 1.08 12/18/2011   No results found for this basename: PTT     I/O last 3 completed shifts: In: 720 [P.O.:720] Out: 951 [Urine:950; Stool:1]  Physical Examination  Patient Vitals for the past 24 hrs:  BP Temp Temp src Pulse Resp SpO2  02/17/12 0630 157/88 mmHg 98.8 F (37.1 C) Oral 98  19  98 %  02/16/12 2147 155/93 mmHg 98.6 F (37 C) Oral 90  20  98 %  02/16/12 1455 140/82 mmHg 98.5 F (36.9 C) - 72  18  96 %    N/V/M bil. LE.  Min. Positive bowel sounds auscultated.  Abdomin soft NTTP.  Assessment/Plan  Paula [name] is a 78 y.o. year old female who is S/P AAA 7 weeks ago.  No evidence of SBO, V/with recent history of vomiting before admission.  She has tolerated clear liquids and has had a BM.   At this time we will start solid food and she how she tolerates it.  [facility] 02/17/2012 7:32 AM   She continues to not have much of appetite. Denies any nausea and vomiting and past 24-48 hours. Is having bowel movements slightly loose. States that food is "not going down". Has not had swallowing eval in the past. Is actively seeing Dr. Porfirio Mylar Doehmeier for a twitch on the left side of her face.Marland Kitchen She will be seeing her again in June.  On exam today abdomen is soft and nontender. Incision well-healed. 3+ femoral pulses bilaterally.  Patient has had no evidence of intra-abdominal problems related to her recent surgery. I think these symptoms predated her vascular surgery. I have asked Dr. Loreta Ave for GI consult and have ordered a barium swallow. Hopefully evaluation can be done as an outpatient and we can discharge patient tomorrow. She also needs continued followup with neurologist.  Modified barium swallow ordered for today and Dr. Loreta Ave to see later today  Patient refused swallow study and wants to go home.

## 2012-02-17 NOTE — Plan of Care (Signed)
Problem: Phase I Progression Outcomes Goal: Other Phase I Outcomes/Goals Outcome: Progressing Determine nature of dysphagia (oropharyngeal vs esophageal) Ethlyn Alto B. Emillee Talsma, MSP, CCC-SLP [phone]

## 2012-02-17 NOTE — Progress Notes (Signed)
INITIAL ADULT NUTRITION ASSESSMENT Date: 02/17/2012   Time: 9:54 AM Reason for Assessment: Screened for nutrition risk; problems chewing and swallowing  ASSESSMENT: Female 78 y.o.  Dx: Nausea and vomiting  Hx:  Past Medical History  Diagnosis Date  . Diabetes mellitus   . COPD (chronic obstructive pulmonary disease)   . Hypertension   . Shortness of breath   . Hypothyroidism   . Depression   . Recurrent upper respiratory infection (URI)   . Seizures   . Ejection fraction     EF 40%, echo, November, 2012  / in improved, EF 60%, echo, December, 2012  . Abnormal EKG     November, 2012  . Contrast media allergy     Patient feels poorly with contrast  . AAA (abdominal aortic aneurysm)     10/2011 - 5.4cm - scheduled to f/u Dr. Hart Rochester  . Pneumonia     10/2011  . Pre-syncope     vasovagal w/ heart block  . Parotid mass     has refused further eval 10/2011  . Chest pain at rest     Myoview 10/14/2011: ef 60%, ? dist anteroseptal scar w/ sublte peri-infarct ischemia vs. attenuation  . Hyperthyroidism   . CHF (congestive heart failure)   . GERD (gastroesophageal reflux disease)   . Headache   . Arthritis   . Anxiety   . Preop cardiovascular exam     Abdominal aortic aneurysm repair by Dr.Lawson  . PONV (postoperative nausea and vomiting)   . Facial tic     L sided  . Macular degeneration     Related Meds:  Scheduled Meds:   . famotidine (PEPCID) IV  20 mg Intravenous Q12H  . levothyroxine  87.5 mcg Intravenous QAC breakfast  . metoCLOPramide (REGLAN) injection  10 mg Intravenous Q6H   Continuous Infusions:   . dextrose 5 % and 0.9 % NaCl with KCl 20 mEq/L 50 mL/hr at 02/17/12 0842   PRN Meds:.acetaminophen, hydrALAZINE, labetalol, metoprolol, ondansetron, phenol, potassium chloride   Ht: 5\' 5"  (165.1 cm)  Wt: 158 lb 11.7 oz (72 kg)  Ideal Wt: 56.8 kg % Ideal Wt: 126.4%  Usual Wt: unknown  Body mass index is 26.41 kg/(m^2). (Overweight)  Food/Nutrition  Related Hx: Patient reports poor appetite. Husband present at time of RD visit, he reported that she ate poorly yesterday. He stated she was eating ok prior to hospitalization. He stated she tried to drink Ensure nutrition supplement at home but disliked it. He reported she was vomiting prior to hospitalization but now only has problems with diarrhea. She reported problems swallowing, foods feel like they get stuck in the throat upon swallowing. Poor PO intake documented 50 % at meals. Per husband patient waiting swallowing evaluation.   Labs:  CMP     Component Value Date/Time   NA 138 02/16/2012 0540   K 3.9 02/16/2012 0540   CL 106 02/16/2012 0540   CO2 20 02/16/2012 0540   GLUCOSE 103* 02/16/2012 0540   BUN 4* 02/16/2012 0540   CREATININE 0.63 02/16/2012 0540   CREATININE 0.80 12/23/2011 0824   CALCIUM 9.2 02/16/2012 0540   PROT 6.3 02/13/2012 1810   ALBUMIN 3.7 02/13/2012 1810   AST 12 02/13/2012 1810   ALT 11 02/13/2012 1810   ALKPHOS 60 02/13/2012 1810   BILITOT 0.6 02/13/2012 1810   GFRNONAA 81* 02/16/2012 0540   GFRAA >90 02/16/2012 0540    Intake/Output Summary (Last 24 hours) at 02/17/12 8119 Last data filed at  02/17/12 0400  Gross per 24 hour  Intake    480 ml  Output    651 ml  Net   -171 ml     Diet Order: Clear Liquid  Supplements/Tube Feeding: none at this time  IVF:    dextrose 5 % and 0.9 % NaCl with KCl 20 mEq/L Last Rate: 50 mL/hr at 02/17/12 0842    Estimated Nutritional Needs:   Kcal: 4098-1191 Protein: 79-93.36 grams Fluid: minimum of 1 ml per kcal  NUTRITION DIAGNOSIS: -Inadequate oral intake (NI-2.1).  Status: Ongoing  RELATED TO: limited ability to eat and poor appetite  AS EVIDENCE BY: clear liquid diet restriction and poor PO intake of 50% at meals.   MONITORING/EVALUATION(Goals): PO intake, Diet advancement, Labs, I/O's 1. Diet advancement as medically able.  2. PO intake > 75% at meals and supplements  EDUCATION NEEDS: -No education needs identified at this  time  INTERVENTION: 1. Recommend Dysphagia 1 Diet- Puree once diet is advanced.  2. Will order [employer] Nutrition supplement BID and prn for poor intake. Provides 250 kcal and 9 grams of protein each.  3. RD to follow for nutrition plan of care.   Dietitian [phone]  DOCUMENTATION CODES Per approved criteria  -Not Applicable    Iven [facility] 02/17/2012, 9:54 AM

## 2012-02-17 NOTE — Evaluation (Signed)
Clinical/Bedside Swallow Evaluation Patient Details  Name: Paula [name] MRN: [id number] DOB: 09-Aug-1927 Today's Date: 02/17/2012  Past Medical History:  Past Medical History  Diagnosis Date  . Diabetes mellitus   . COPD (chronic obstructive pulmonary disease)   . Hypertension   . Shortness of breath   . Hypothyroidism   . Depression   . Recurrent upper respiratory infection (URI)   . Seizures   . Ejection fraction     EF 40%, echo, November, 2012  / in improved, EF 60%, echo, December, 2012  . Abnormal EKG     November, 2012  . Contrast media allergy     Patient feels poorly with contrast  . AAA (abdominal aortic aneurysm)     10/2011 - 5.4cm - scheduled to f/u Dr. Hart Rochester  . Pneumonia     10/2011  . Pre-syncope     vasovagal w/ heart block  . Parotid mass     has refused further eval 10/2011  . Chest pain at rest     Myoview 10/14/2011: ef 60%, ? dist anteroseptal scar w/ sublte peri-infarct ischemia vs. attenuation  . Hyperthyroidism   . CHF (congestive heart failure)   . GERD (gastroesophageal reflux disease)   . Headache   . Arthritis   . Anxiety   . Preop cardiovascular exam     Abdominal aortic aneurysm repair by Dr.Lawson  . PONV (postoperative nausea and vomiting)   . Facial tic     L sided  . Macular degeneration    Past Surgical History:  Past Surgical History  Procedure Date  . Cholecystectomy   . Joint replacement   . Sinus surgery with instatrak   . Appendectomy   . Oophorectomy   . Arthroscopic repair acl   . Eye surgery   . Cardiac catheterization 11/04/11  . Bladder repair   . Hernia repair     abdominal  . US echocardiography 10/14/11  . Abdominal aortic aneurysm repair 12/27/2011    Procedure: ANEURYSM ABDOMINAL AORTIC REPAIR;  Surgeon: Josephina Gip, MD;  Location: [facility] OR;  Service: Vascular;  Laterality: N/A;  Resection and Grafting Abdominal Aortic Aneurysm , Aorta Bi Iliac.   HPI:  78 year old female admitted 02/13/12 due to nausea and  vomiting 6 weeks s/p AAA repair.  PMH significant for DM, COPD, HTN, SOB, depression, anxiety, EF 60%, PNA 12/12, presyncope, parotid mass, chest pain, CHF, GERD, headache, arthritis, macular degeneration.   Assessment/Recommendations/Treatment Plan Suspected Esophageal Findings Suspected Esophageal Findings: Belching;Globus sensation  SLP Assessment Clinical Impression Statement: No overt s/s aspiration observed or reported.  Husband reports pt has indicated sensation of food sticking, and exhibited belching during bedside.  While pt may have some degree of oropharyngeal dysphagia, completion of regular barium swallow is recommended first to evaluate esophageal motility.  SLP will follow up after results are available, and proceed with further diagnostic treatment as appropriate.   Risk for Aspiration: Moderate Other Related Risk Factors: History of GERD;History of pneumonia;Cognitive impairment (parotid mass, COPD)  Swallow Evaluation Recommendations Recommended Consults: Consider esophageal assessment Diet Recommendations:  (pending Barium swallow results.) Oral Care Recommendations: Oral care BID;Staff/trained caregiver to provide oral care Follow up Recommendations:  (Pending Barium Swallow results and SLP follow up)  Treatment Plan Treatment Plan Recommendations: Therapy as outlined in treatment plan below Speech Therapy Frequency: min 1 x/week Treatment Duration: 1 week Interventions: Patient/family education;Other (Comment);Diet toleration management by SLP (diagnostic treatment to detemine if MBS is warranted.)  Prognosis Prognosis for Safe Diet  Advancement: Fair Barriers to Reach Goals: Cognitive deficits  Individuals Consulted Consulted and Agree with Results and Recommendations: Family member/caregiver;RN Family Member Consulted: husband  Swallowing Goals  SLP Swallowing Goals Patient will consume recommended diet without observed clinical signs of aspiration with: Total  assistance Swallow Study Goal #1 - Progress: Progressing toward goal Patient will utilize recommended strategies during swallow to increase swallowing safety with: Total assistance Swallow Study Goal #2 - Progress: Progressing toward goal  General  Date of Onset:  (Intermittent difficulty prior to AAA surgery February 3513) HPI: 78 year old female admitted 02/13/12 due to nausea and vomiting 6 weeks s/p AAA repair.  PMH significant for DM, COPD, HTN, SOB, depression, anxiety, EF 60%, PNA 12/12, presyncope, parotid mass, chest pain, CHF, GERD, headache, arthritis, macular degeneration. Type of Study: Bedside swallow evaluation Diet Prior to this Study: Other (Comment) (clear liquid) Temperature Spikes Noted: No Respiratory Status: Room air Behavior/Cognition: Confused;Doesn't follow directions Oral Cavity - Dentition: Adequate natural dentition Vision: Impaired for self-feeding Patient Positioning: Upright in bed Baseline Vocal Quality: Clear Volitional Cough: Cognitively unable to elicit Volitional Swallow: Unable to elicit  Oral Motor/Sensory Function  Overall Oral Motor/Sensory Function: Appears within functional limits for tasks assessed  Consistency Results    Thin Liquid Thin Liquid: Within functional limits Presentation: Cup Shahan Starks B. Ivee Poellnitz, [facility], CCC-SLP [phone] 02/17/2012,1:28 PM

## 2012-02-17 NOTE — Discharge Summary (Signed)
[facility] Discharge Summary   Patient ID:  Paula [name] MRN: [id number] DOB/AGE: 76-Oct-1928 76 y.o.  Admit date: 02/13/2012 Discharge date: 02/17/2012 Date of Surgery: * No surgery found * Surgeon:   Admission Diagnosis: Abd pain, nausea  Discharge Diagnoses:  Abd pain, nausea  Secondary Diagnoses: Past Medical History  Diagnosis Date  . Diabetes mellitus   . COPD (chronic obstructive pulmonary disease)   . Hypertension   . Shortness of breath   . Hypothyroidism   . Depression   . Recurrent upper respiratory infection (URI)   . Seizures   . Ejection fraction     EF 40%, echo, November, 2012  / in improved, EF 60%, echo, December, 2012  . Abnormal EKG     November, 2012  . Contrast media allergy     Patient feels poorly with contrast  . AAA (abdominal aortic aneurysm)     10/2011 - 5.4cm - scheduled to f/u Dr. Hart Rochester  . Pneumonia     10/2011  . Pre-syncope     vasovagal w/ heart block  . Parotid mass     has refused further eval 10/2011  . Chest pain at rest     Myoview 10/14/2011: ef 60%, ? dist anteroseptal scar w/ sublte peri-infarct ischemia vs. attenuation  . Hyperthyroidism   . CHF (congestive heart failure)   . GERD (gastroesophageal reflux disease)   . Headache   . Arthritis   . Anxiety   . Preop cardiovascular exam     Abdominal aortic aneurysm repair by Dr.Lawson  . PONV (postoperative nausea and vomiting)   . Facial tic     L sided  . Macular degeneration       Discharged Condition: fair  HPI:  Patient was admitted for work of bowel obstruction nausea and vomiting s/p AAA repair 7 weeks ago.  She was NPO for 36 hours and then placed on a clear liquid diet.  She continued to have symptoms of nausea daily.  She did have a bowel movement Saturday 02-15-12.  At this time her and her family want her to return home without any further work up at this time.    Hospital Course:  Paula [name] is a 78 y.o. female is S/P AAA 7 weeks ago  returns with obstructive bowel symptoms.  She was NPO for 36 hours then placed on clear liquids.  Today Dr. Hart Rochester order a swallow study and asked for a GI consult.  The patient is now refusing further work up and wants to go home.    Consults:     Significant Diagnostic Studies: CBC Lab Results  Component Value Date   WBC 12.4* 02/13/2012   HGB 11.6* 02/13/2012   HCT 35.8* 02/13/2012   MCV 95.5 02/13/2012   PLT 297 02/13/2012    BMET    Component Value Date/Time   NA 138 02/16/2012 0540   K 3.9 02/16/2012 0540   CL 106 02/16/2012 0540   CO2 20 02/16/2012 0540   GLUCOSE 103* 02/16/2012 0540   BUN 4* 02/16/2012 0540   CREATININE 0.63 02/16/2012 0540   CREATININE 0.80 12/23/2011 0824   CALCIUM 9.2 02/16/2012 0540   GFRNONAA 81* 02/16/2012 0540   GFRAA >90 02/16/2012 0540   COAG Lab Results  Component Value Date   INR 1.10 02/13/2012   INR 1.93* 12/27/2011   INR 1.08 12/18/2011     Disposition:  Discharge to :Home Discharge Orders    Future Appointments: Provider: Department:  Dept Phone: Center:   03/13/2012 9:00 AM Luis Abed, MD [facility] [phone] [id number]   03/17/2012 1:00 PM [facility] [phone] [facility]   03/17/2012 1:30 PM Pryor Ochoa, MD [facility] [phone] [facility]      Paula [name], Son  Home Medication Instructions UVO:[id number]   Printed on:02/17/12 1533  Medication Information                    ALPRAZolam (XANAX) 0.5 MG tablet Take 0.5 mg by mouth 3 (three) times daily as needed. For anxiety           aspirin EC 81 MG tablet Take 81 mg by mouth daily.           hydrochlorothiazide (HYDRODIURIL) 25 MG tablet Take 1 tablet (25 mg total) by mouth daily.           potassium chloride (K-DUR) 10 MEQ tablet Take 1 tablet (10 mEq total) by mouth daily.           baclofen (LIORESAL) 10 MG tablet            pantoprazole (PROTONIX) 40 MG tablet            levothyroxine (SYNTHROID, LEVOTHROID) 175 MCG tablet Take 175 mcg by mouth daily.             Verbal and written Discharge instructions given to the patient. Wound care per Discharge AVS The patient will f/u in 4 weeks with Dr. Hart Rochester.  SignedMosetta Pigeon 02/17/2012, 3:33 PM

## 2012-02-17 NOTE — Progress Notes (Signed)
Pt/husband given discharge instructions, medication lists, follow up appointments and when to call the doctor.  Husband verbalized understanding of instructions. Husband concerned that son is POA and has not visited since they returned from vacation.  He will call son to help tonight with care of wife. He wants her son to start looking at assisted living arrangements for the two of them at a future date. Thomas Hoff

## 2012-03-08 ENCOUNTER — Encounter (HOSPITAL_COMMUNITY): Payer: Self-pay | Admitting: *Deleted

## 2012-03-08 ENCOUNTER — Emergency Department (HOSPITAL_COMMUNITY)
Admission: EM | Admit: 2012-03-08 | Discharge: 2012-03-08 | Disposition: A | Discharge status: Home / Self Care | Payer: Medicare Other | Attending: Emergency Medicine | Admitting: Emergency Medicine

## 2012-03-08 DIAGNOSIS — R259 Unspecified abnormal involuntary movements: Secondary | ICD-10-CM | POA: Insufficient documentation

## 2012-03-08 DIAGNOSIS — I509 Heart failure, unspecified: Secondary | ICD-10-CM | POA: Insufficient documentation

## 2012-03-08 DIAGNOSIS — R251 Tremor, unspecified: Secondary | ICD-10-CM

## 2012-03-08 DIAGNOSIS — I10 Essential (primary) hypertension: Secondary | ICD-10-CM | POA: Insufficient documentation

## 2012-03-08 DIAGNOSIS — R55 Syncope and collapse: Secondary | ICD-10-CM

## 2012-03-08 DIAGNOSIS — Z79899 Other long term (current) drug therapy: Secondary | ICD-10-CM | POA: Insufficient documentation

## 2012-03-08 DIAGNOSIS — E119 Type 2 diabetes mellitus without complications: Secondary | ICD-10-CM | POA: Insufficient documentation

## 2012-03-08 DIAGNOSIS — J4489 Other specified chronic obstructive pulmonary disease: Secondary | ICD-10-CM | POA: Insufficient documentation

## 2012-03-08 DIAGNOSIS — J449 Chronic obstructive pulmonary disease, unspecified: Secondary | ICD-10-CM | POA: Insufficient documentation

## 2012-03-08 LAB — GLUCOSE, CAPILLARY: Glucose-Capillary: 141 mg/dL — ABNORMAL HIGH (ref 70–99)

## 2012-03-08 LAB — BASIC METABOLIC PANEL
CO2: 28 mEq/L (ref 19–32)
Chloride: 102 mEq/L (ref 96–112)
Sodium: 140 mEq/L (ref 135–145)

## 2012-03-08 LAB — CBC
Platelets: 305 10*3/uL (ref 150–400)
RBC: 3.64 MIL/uL — ABNORMAL LOW (ref 3.87–5.11)
WBC: 10.9 10*3/uL — ABNORMAL HIGH (ref 4.0–10.5)

## 2012-03-08 NOTE — ED Notes (Signed)
Patient complains of facial numbness in the chin area and the left jaw. Patient advises that she has had this problem off and on the several years. She advises that she seen her Neurologist on Friday for the same. Patient is and oriented and denies pain.

## 2012-03-08 NOTE — ED Notes (Signed)
Patient remains alert and oriented and passed the Stroke swallow screen without problems

## 2012-03-08 NOTE — ED Notes (Addendum)
Having facial numbness and h/a. bp 148/92, hr 106. Onset: last Friday and prescribed baclofen. No other neurological deficits.

## 2012-03-08 NOTE — ED Notes (Signed)
Gave old and new ECG to Dr. Johnella Moloney after I performed and also CBG was 141 when I checked it. 2:16pm JG.

## 2012-03-08 NOTE — Discharge Instructions (Signed)
To continue followup with neurology as scheduled. So they can continue to workup the tremors. Return for any new or worse symptoms particularly if you continue to have the feeling of almost passing out or do pass out. Today's labs are consistent to your previous ones.

## 2012-03-08 NOTE — ED Provider Notes (Signed)
History     CSN: [id number]  Arrival date & time 03/08/12  1354   First MD Initiated Contact with Patient 03/08/12 1358      Chief Complaint  Patient presents with  . Numbness    (Consider location/radiation/quality/duration/timing/severity/associated sxs/prior treatment) The history is provided by the patient and the EMS personnel.   Patient is a 78 year old female brought in by EMS for feeling that she was going to pass out therefore near-syncope. Patient chief complaint is listed as numbness but what that really is that she's had a long-standing twitch to the left side of her face that his being followed by [facility] neurology workup still in process specific etiology not clear, therapeutic medications are being tried to resolve this. No significant change in that today. Patient denies headache patient denies chest pain abdominal pain nausea vomiting shortness of breath. Patient recently underwent a abdominal aortic aneurysm repair. Patient denies abdominal pain. Patient denies any strokelike symptoms. Past Medical History  Diagnosis Date  . Diabetes mellitus   . COPD (chronic obstructive pulmonary disease)   . Hypertension   . Shortness of breath   . Hypothyroidism   . Depression   . Recurrent upper respiratory infection (URI)   . Seizures   . Ejection fraction     EF 40%, echo, November, 2012  / in improved, EF 60%, echo, December, 2012  . Abnormal EKG     November, 2012  . Contrast media allergy     Patient feels poorly with contrast  . AAA (abdominal aortic aneurysm)     10/2011 - 5.4cm - scheduled to f/u Dr. Hart Rochester  . Pneumonia     10/2011  . Pre-syncope     vasovagal w/ heart block  . Parotid mass     has refused further eval 10/2011  . Chest pain at rest     Myoview 10/14/2011: ef 60%, ? dist anteroseptal scar w/ sublte peri-infarct ischemia vs. attenuation  . Hyperthyroidism   . CHF (congestive heart failure)   . GERD (gastroesophageal reflux disease)   .  Headache   . Arthritis   . Anxiety   . Preop cardiovascular exam     Abdominal aortic aneurysm repair by Dr.Lawson  . PONV (postoperative nausea and vomiting)   . Facial tic     L sided  . Macular degeneration     Past Surgical History  Procedure Date  . Cholecystectomy   . Joint replacement   . Sinus surgery with instatrak   . Appendectomy   . Oophorectomy   . Arthroscopic repair acl   . Eye surgery   . Cardiac catheterization 11/04/11  . Bladder repair   . Hernia repair     abdominal  . US echocardiography 10/14/11  . Abdominal aortic aneurysm repair 12/27/2011    Procedure: ANEURYSM ABDOMINAL AORTIC REPAIR;  Surgeon: Josephina Gip, MD;  Location: [facility] OR;  Service: Vascular;  Laterality: N/A;  Resection and Grafting Abdominal Aortic Aneurysm , Aorta Bi Iliac.    Family History  Problem Relation Age of Onset  . Cancer Father     History  Substance Use Topics  . Smoking status: Former Smoker -- 1.0 packs/day for 65 years    Types: Cigarettes    Quit date: 08/31/2011  . Smokeless tobacco: Never Used  . Alcohol Use: No    OB History    Grav Para Term Preterm Abortions TAB SAB Ect Mult Living  Review of Systems  Constitutional: Negative for fever and chills.  HENT: Negative for congestion, sore throat and neck pain.   Eyes: Negative for visual disturbance.  Respiratory: Negative for shortness of breath.   Cardiovascular: Negative for chest pain and leg swelling.  Gastrointestinal: Negative for nausea, vomiting, abdominal pain and diarrhea.  Genitourinary: Negative for dysuria.  Musculoskeletal: Negative for back pain.  Skin: Negative for rash.  Neurological: Positive for tremors. Negative for syncope, facial asymmetry, speech difficulty, weakness and headaches.  Hematological: Does not bruise/bleed easily.    Allergies  Contrast media; Iohexol; Penicillins; and Sulfa antibiotics  Home Medications   Current Outpatient Rx  Name Route Sig  Dispense Refill  . ACETAMINOPHEN 500 MG PO TABS Oral Take 500 mg by mouth 3 (three) times daily.    Marland Kitchen ALPRAZOLAM 0.5 MG PO TABS Oral Take 0.5 mg by mouth 3 (three) times daily as needed. For anxiety    . ASPIRIN EC 81 MG PO TBEC Oral Take 81 mg by mouth daily.    Marland Kitchen BACLOFEN 10 MG PO TABS Oral Take 10 mg by mouth at bedtime.     . BUPROPION HCL ER (XL) 300 MG PO TB24 Oral Take 300 mg by mouth daily.    Marland Kitchen HYDROCHLOROTHIAZIDE 25 MG PO TABS Oral Take 25 mg by mouth daily.    Marland Kitchen LEVOTHYROXINE SODIUM 175 MCG PO TABS Oral Take 175 mcg by mouth daily.      BP 143/66  Pulse 96  Temp(Src) 98.2 F (36.8 C) (Oral)  Resp 26  SpO2 95%  Physical Exam  Nursing note and vitals reviewed. Constitutional: She is oriented to person, place, and time. She appears well-developed and well-nourished. No distress.  HENT:  Head: Normocephalic and atraumatic.  Eyes: Conjunctivae and EOM are normal. Pupils are equal, round, and reactive to light.  Neck: Normal range of motion. Neck supple.  Cardiovascular: Normal rate, regular rhythm and normal heart sounds.   No murmur heard. Pulmonary/Chest: Effort normal and breath sounds normal. No respiratory distress. She has no rales. She exhibits no tenderness.  Abdominal: Soft. Bowel sounds are normal. There is no tenderness.       Well healing midline abdominal incision from aortic aneurysm repair.  Musculoskeletal: Normal range of motion. She exhibits no edema.  Neurological: She is alert and oriented to person, place, and time. No cranial nerve deficit. She exhibits normal muscle tone. Coordination normal.       Neurologic exam normal except for an occasional twitch to the left side of her face otherwise motor function is normal.  Skin: Skin is warm. No rash noted.    ED Course  Procedures (including critical care time)  Labs Reviewed  GLUCOSE, CAPILLARY - Abnormal; Notable for the following:    Glucose-Capillary 141 (*)    All other components within normal  limits  CBC - Abnormal; Notable for the following:    WBC 10.9 (*)    RBC 3.64 (*)    Hemoglobin 11.6 (*)    HCT 35.1 (*)    RDW 16.9 (*)    All other components within normal limits  BASIC METABOLIC PANEL - Abnormal; Notable for the following:    Glucose, Bld 116 (*)    GFR calc non Af Amer 60 (*)    GFR calc Af Amer 69 (*)    All other components within normal limits   No results found. Results for orders placed during the hospital encounter of 03/08/12  GLUCOSE, CAPILLARY  Component Value Range   Glucose-Capillary 141 (*) 70 - 99 (mg/dL)   Comment 1 Notify RN    CBC      Component Value Range   WBC 10.9 (*) 4.0 - 10.5 (K/uL)   RBC 3.64 (*) 3.87 - 5.11 (MIL/uL)   Hemoglobin 11.6 (*) 12.0 - 15.0 (g/dL)   HCT 09.8 (*) 11.9 - 46.0 (%)   MCV 96.4  78.0 - 100.0 (fL)   MCH 31.9  26.0 - 34.0 (pg)   MCHC 33.0  30.0 - 36.0 (g/dL)   RDW 14.7 (*) 82.9 - 15.5 (%)   Platelets 305  150 - 400 (K/uL)  BASIC METABOLIC PANEL      Component Value Range   Sodium 140  135 - 145 (mEq/L)   Potassium 3.6  3.5 - 5.1 (mEq/L)   Chloride 102  96 - 112 (mEq/L)   CO2 28  19 - 32 (mEq/L)   Glucose, Bld 116 (*) 70 - 99 (mg/dL)   BUN 12  6 - 23 (mg/dL)   Creatinine, Ser 5.62  0.50 - 1.10 (mg/dL)   Calcium 9.6  8.4 - 13.0 (mg/dL)   GFR calc non Af Amer 60 (*) >90 (mL/min)   GFR calc Af Amer 69 (*) >90 (mL/min)    Date: 03/08/2012  Rate: 103  Rhythm: sinus tachycardia  QRS Axis: normal  Intervals: normal  ST/T Wave abnormalities: normal  Conduction Disutrbances:first-degree A-V block   Narrative Interpretation:   Old EKG Reviewed: unchanged No change in EKG compared to 02/16/2012.   1. Near syncope   2. Tremor       MDM  Patient with mild near syncopal feeling at home patient had several years of a twitch or spasm to the left side of her face this has been followed by [facility] neurology they're still in the process of trying to delineate the cause of this and appropriate treatment.  They tried some medications without any help this is as stated present for several years not significantly worse today patient called the ambulance at home because she was afraid that she had something bad going on and was given a maybe pass out no chest pain no abdominal pain patient has had an aortic aneurysm repair recently the wound for that is healing well today's lab workup without any significant changes EKG without any acute changes patient now feels better. Will discharge home with continued followup with [facility] neurology patient knows to return if she does pass out or for any new or worse symptoms.        Shelda Jakes, MD 03/08/12 [phone]

## 2012-03-10 ENCOUNTER — Encounter: Payer: Self-pay | Admitting: Cardiology

## 2012-03-10 DIAGNOSIS — Z9889 Other specified postprocedural states: Secondary | ICD-10-CM

## 2012-03-10 DIAGNOSIS — Z8679 Personal history of other diseases of the circulatory system: Secondary | ICD-10-CM | POA: Insufficient documentation

## 2012-03-12 ENCOUNTER — Ambulatory Visit: Payer: Medicare Other | Attending: Neurology | Admitting: Rehabilitative and Restorative Service Providers"

## 2012-03-12 DIAGNOSIS — R269 Unspecified abnormalities of gait and mobility: Secondary | ICD-10-CM | POA: Insufficient documentation

## 2012-03-12 DIAGNOSIS — IMO0001 Reserved for inherently not codable concepts without codable children: Secondary | ICD-10-CM | POA: Insufficient documentation

## 2012-03-12 DIAGNOSIS — M6281 Muscle weakness (generalized): Secondary | ICD-10-CM | POA: Insufficient documentation

## 2012-03-13 ENCOUNTER — Ambulatory Visit (INDEPENDENT_AMBULATORY_CARE_PROVIDER_SITE_OTHER): Payer: Medicare Other | Admitting: Cardiology

## 2012-03-13 ENCOUNTER — Encounter: Payer: Self-pay | Admitting: Cardiology

## 2012-03-13 VITALS — BP 114/72 | HR 85 | Ht 65.0 in | Wt 155.0 lb

## 2012-03-13 DIAGNOSIS — I714 Abdominal aortic aneurysm, without rupture: Secondary | ICD-10-CM

## 2012-03-13 DIAGNOSIS — R079 Chest pain, unspecified: Secondary | ICD-10-CM

## 2012-03-13 NOTE — Assessment & Plan Note (Signed)
Patient is not having any significant chest pain. She stable. I'll see her back in 6 months.

## 2012-03-13 NOTE — Progress Notes (Signed)
HPI  The patient is seen for followup her overall cardiac status. I seen her in February, 2013. At that time I cleared her for abdominal aortic aneurysm surgery. This was done with an open operation by Dr. Hart Rochester. Ultimately she has done well. Her cardiac status remained stable.  Allergies  Allergen Reactions  . Contrast Media (Iodinated Diagnostic Agents) Anaphylaxis  . Iohexol Shortness Of Breath and Swelling     Desc: PT STATES SHE HAD A SEVERE REACTION TO IV CONRAST WITH THROAT SWELLING AND SOB. SHE WAS ADMITTED TO THE HOSPITAL. SHE HAS NEVER HAD CONTRAST AGAIN.   Marland Kitchen Penicillins Rash  . Sulfa Antibiotics Anaphylaxis  . Baclofen     Current Outpatient Prescriptions  Medication Sig Dispense Refill  . acetaminophen (TYLENOL) 500 MG tablet Take 500 mg by mouth 3 (three) times daily.      Marland Kitchen ALPRAZolam (XANAX) 0.5 MG tablet Take 0.5 mg by mouth 3 (three) times daily as needed. For anxiety      . aspirin EC 81 MG tablet Take 81 mg by mouth daily.      Marland Kitchen buPROPion (WELLBUTRIN XL) 300 MG 24 hr tablet Take 300 mg by mouth daily.      . hydrochlorothiazide (HYDRODIURIL) 25 MG tablet Take 25 mg by mouth daily.      Marland Kitchen levothyroxine (SYNTHROID, LEVOTHROID) 175 MCG tablet Take 175 mcg by mouth daily.      . pantoprazole (PROTONIX) 40 MG tablet Take 1 tablet by mouth daily.        History   Social History  . Marital Status: Married    Spouse Name: N/A    Number of Children: N/A  . Years of Education: N/A   Occupational History  . Not on file.   Social History Main Topics  . Smoking status: Former Smoker -- 1.0 packs/day for 65 years    Types: Cigarettes    Quit date: 08/31/2011  . Smokeless tobacco: Never Used  . Alcohol Use: No  . Drug Use: No  . Sexually Active: Not Currently    Birth Control/ Protection: Post-menopausal   Other Topics Concern  . Not on file   Social History Narrative  . No narrative on file    Family History  Problem Relation Age of Onset  . Cancer  Father     Past Medical History  Diagnosis Date  . Diabetes mellitus   . COPD (chronic obstructive pulmonary disease)   . Hypertension   . Shortness of breath   . Hypothyroidism   . Depression   . Recurrent upper respiratory infection (URI)   . Seizures   . Ejection fraction     EF 40%, echo, November, 2012  / in improved, EF 60%, echo, December, 2012  . Abnormal EKG     November, 2012  . Contrast media allergy     Patient feels poorly with contrast  . Status post AAA (abdominal aortic aneurysm) repair     Surgical repair, Dr. Hart Rochester, December 27, 2011  . Pneumonia     10/2011  . Pre-syncope     vasovagal w/ heart block  . Parotid mass     has refused further eval 10/2011  . Chest pain at rest     Myoview 10/14/2011: ef 60%, ? dist anteroseptal scar w/ sublte peri-infarct ischemia vs. attenuation  . Hyperthyroidism   . CHF (congestive heart failure)   . GERD (gastroesophageal reflux disease)   . Headache   . Arthritis   .  Anxiety   . Preop cardiovascular exam     Abdominal aortic aneurysm repair by Dr.Lawson  . PONV (postoperative nausea and vomiting)   . Facial tic     L sided  . Macular degeneration     Past Surgical History  Procedure Date  . Cholecystectomy   . Joint replacement   . Sinus surgery with instatrak   . Appendectomy   . Oophorectomy   . Arthroscopic repair acl   . Eye surgery   . Cardiac catheterization 11/04/11  . Bladder repair   . Hernia repair     abdominal  . US echocardiography 10/14/11  . Abdominal aortic aneurysm repair 12/27/2011    Procedure: ANEURYSM ABDOMINAL AORTIC REPAIR;  Surgeon: Josephina Gip, MD;  Location: [facility] OR;  Service: Vascular;  Laterality: N/A;  Resection and Grafting Abdominal Aortic Aneurysm , Aorta Bi Iliac.    ROS  Patient denied fever, chills, headache, rash, sweats, change in vision, change in hearing, chest pain, cough, nausea vomiting, urinary symptoms. All other systems are reviewed and are  negative.  PHYSICAL EXAM Patient is stable today. She is here with her husband. Her husband has a better understanding of her overall health care than she does. She is oriented to person time and place. Affect is normal. There is no jugulovenous distention. Lungs are clear. Respiratory effort is not labored. Cardiac exam reveals S1 and S2. There no clicks or significant murmurs. The abdomen is soft. There is no peripheral edema.  Filed Vitals:   03/13/12 0919  BP: 114/72  Pulse: 85  Height: 5\' 5"  (1.651 m)  Weight: 155 lb (70.308 kg)     ASSESSMENT & PLAN

## 2012-03-13 NOTE — Assessment & Plan Note (Signed)
The patient has had successful aortic aneurysm repair. She is stable.

## 2012-03-13 NOTE — Patient Instructions (Signed)
Your physician wants you to follow-up in:  6 months. You will receive a reminder letter in the mail two months in advance. If you don't receive a letter, please call our office to schedule the follow-up appointment.   

## 2012-03-16 ENCOUNTER — Encounter: Payer: Self-pay | Admitting: Vascular Surgery

## 2012-03-16 ENCOUNTER — Ambulatory Visit: Payer: Medicare Other | Admitting: Rehabilitative and Restorative Service Providers"

## 2012-03-17 ENCOUNTER — Ambulatory Visit (INDEPENDENT_AMBULATORY_CARE_PROVIDER_SITE_OTHER): Payer: Medicare Other | Admitting: Vascular Surgery

## 2012-03-17 ENCOUNTER — Encounter: Payer: Self-pay | Admitting: Vascular Surgery

## 2012-03-17 ENCOUNTER — Encounter (INDEPENDENT_AMBULATORY_CARE_PROVIDER_SITE_OTHER): Payer: Medicare Other | Admitting: *Deleted

## 2012-03-17 VITALS — BP 152/85 | HR 94 | Resp 18 | Ht 65.0 in | Wt 152.0 lb

## 2012-03-17 DIAGNOSIS — Z48812 Encounter for surgical aftercare following surgery on the circulatory system: Secondary | ICD-10-CM

## 2012-03-17 DIAGNOSIS — I714 Abdominal aortic aneurysm, without rupture, unspecified: Secondary | ICD-10-CM | POA: Insufficient documentation

## 2012-03-17 NOTE — Progress Notes (Signed)
Subjective:     Patient ID: Paula [name], female   DOB: [birth year], 78 y.o.   MRN: [id number]  HPI this 78 year old female returns for continued followup regarding her resection and grafting of perirenal abdominal aortic aneurysm with insertion aortobiiliac graft on 12/27/2011. She did require rehospitalization for some nausea and vomiting which resolved. Since that time she has done well from a GI standpoint. She has had continued visual problems and is following up with Dr. Luciana Axe. She also is followed by Dr.Dohmeir and is currently receiving rehabilitation.  Past Medical History  Diagnosis Date  . Diabetes mellitus   . COPD (chronic obstructive pulmonary disease)   . Hypertension   . Shortness of breath   . Hypothyroidism   . Depression   . Recurrent upper respiratory infection (URI)   . Seizures   . Ejection fraction     EF 40%, echo, November, 2012  / in improved, EF 60%, echo, December, 2012  . Abnormal EKG     November, 2012  . Contrast media allergy     Patient feels poorly with contrast  . Status post AAA (abdominal aortic aneurysm) repair     Surgical repair, Dr. Hart Rochester, December 27, 2011  . Pneumonia     10/2011  . Pre-syncope     vasovagal w/ heart block  . Parotid mass     has refused further eval 10/2011  . Chest pain at rest     Myoview 10/14/2011: ef 60%, ? dist anteroseptal scar w/ sublte peri-infarct ischemia vs. attenuation  . Hyperthyroidism   . CHF (congestive heart failure)   . GERD (gastroesophageal reflux disease)   . Headache   . Arthritis   . Anxiety   . Preop cardiovascular exam     Abdominal aortic aneurysm repair by Dr.Keyion Knack  . PONV (postoperative nausea and vomiting)   . Facial tic     L sided  . Macular degeneration     History  Substance Use Topics  . Smoking status: Former Smoker -- 1.0 packs/day for 65 years    Types: Cigarettes    Quit date: 08/31/2011  . Smokeless tobacco: Never Used  . Alcohol Use: No    Family History    Problem Relation Age of Onset  . Cancer Father     Allergies  Allergen Reactions  . Contrast Media (Iodinated Diagnostic Agents) Anaphylaxis  . Iohexol Shortness Of Breath and Swelling     Desc: PT STATES SHE HAD A SEVERE REACTION TO IV CONRAST WITH THROAT SWELLING AND SOB. SHE WAS ADMITTED TO THE HOSPITAL. SHE HAS NEVER HAD CONTRAST AGAIN.   Marland Kitchen Penicillins Rash  . Sulfa Antibiotics Anaphylaxis  . Baclofen     Current outpatient prescriptions:acetaminophen (TYLENOL) 500 MG tablet, Take 500 mg by mouth 3 (three) times daily., Disp: , Rfl: ;  ALPRAZolam (XANAX) 0.5 MG tablet, Take 0.5 mg by mouth 3 (three) times daily as needed. For anxiety, Disp: , Rfl: ;  aspirin EC 81 MG tablet, Take 81 mg by mouth daily., Disp: , Rfl: ;  buPROPion (WELLBUTRIN XL) 300 MG 24 hr tablet, Take 300 mg by mouth daily., Disp: , Rfl:  hydrochlorothiazide (HYDRODIURIL) 25 MG tablet, Take 25 mg by mouth daily., Disp: , Rfl: ;  levothyroxine (SYNTHROID, LEVOTHROID) 175 MCG tablet, Take 175 mcg by mouth daily., Disp: , Rfl: ;  pantoprazole (PROTONIX) 40 MG tablet, Take 1 tablet by mouth daily., Disp: , Rfl:   BP 152/85  Pulse 94  Resp 18  Ht 5\' 5"  (1.651 m)  Wt 152 lb (68.947 kg)  BMI 25.29 kg/m2  Body mass index is 25.29 kg/(m^2).           Review of Systems     Objective:   Physical Exam blood pressure 152/85 heart rate 94 respirations 18 General well-developed well-nourished female in no apparent stress alert and oriented x3 Abdomen soft nontender. Midline incision is well-healed. No evidence of ventral hernia. Lower extremities 3+ femoral and dorsalis pedis pulses palpable bilaterally.    Assessment:     Doing well 3 months post open resection and grafting of abdominal aortic aneurysm with insertion aortobiiliac graft    Plan:     Return to see [place] on a when necessary basis

## 2012-03-20 ENCOUNTER — Ambulatory Visit: Payer: Medicare Other | Admitting: Rehabilitative and Restorative Service Providers"

## 2012-03-23 ENCOUNTER — Ambulatory Visit: Payer: Medicare Other | Admitting: Rehabilitative and Restorative Service Providers"

## 2012-03-27 ENCOUNTER — Ambulatory Visit: Payer: Medicare Other | Admitting: Rehabilitative and Restorative Service Providers"

## 2012-03-30 ENCOUNTER — Ambulatory Visit: Payer: Medicare Other | Admitting: Rehabilitative and Restorative Service Providers"

## 2012-04-03 ENCOUNTER — Ambulatory Visit: Payer: Medicare Other | Admitting: Physical Therapy

## 2012-04-07 ENCOUNTER — Ambulatory Visit: Payer: Medicare Other | Admitting: Rehabilitative and Restorative Service Providers"

## 2012-04-10 ENCOUNTER — Ambulatory Visit: Payer: Medicare Other | Admitting: Rehabilitative and Restorative Service Providers"

## 2012-04-14 ENCOUNTER — Ambulatory Visit: Payer: Medicare Other | Attending: Neurology | Admitting: Rehabilitative and Restorative Service Providers"

## 2012-04-14 DIAGNOSIS — M6281 Muscle weakness (generalized): Secondary | ICD-10-CM | POA: Insufficient documentation

## 2012-04-14 DIAGNOSIS — R269 Unspecified abnormalities of gait and mobility: Secondary | ICD-10-CM | POA: Insufficient documentation

## 2012-04-14 DIAGNOSIS — IMO0001 Reserved for inherently not codable concepts without codable children: Secondary | ICD-10-CM | POA: Insufficient documentation

## 2012-04-17 ENCOUNTER — Ambulatory Visit: Payer: Medicare Other | Admitting: Rehabilitative and Restorative Service Providers"

## 2012-04-20 ENCOUNTER — Ambulatory Visit: Payer: Medicare Other | Admitting: Rehabilitative and Restorative Service Providers"

## 2012-04-24 ENCOUNTER — Encounter: Payer: Medicare Other | Admitting: Rehabilitative and Restorative Service Providers"

## 2012-04-27 ENCOUNTER — Ambulatory Visit: Payer: Medicare Other | Admitting: Rehabilitative and Restorative Service Providers"

## 2012-05-01 ENCOUNTER — Encounter: Payer: Medicare Other | Admitting: Rehabilitative and Restorative Service Providers"

## 2012-05-04 ENCOUNTER — Encounter: Payer: Medicare Other | Admitting: Rehabilitative and Restorative Service Providers"

## 2012-05-05 ENCOUNTER — Encounter: Payer: Medicare Other | Admitting: Physical Therapy

## 2012-05-08 ENCOUNTER — Ambulatory Visit: Payer: Medicare Other | Admitting: Rehabilitative and Restorative Service Providers"

## 2012-05-13 ENCOUNTER — Encounter: Payer: Medicare Other | Admitting: Rehabilitative and Restorative Service Providers"

## 2012-05-15 ENCOUNTER — Encounter: Payer: Medicare Other | Admitting: Rehabilitative and Restorative Service Providers"

## 2012-05-25 ENCOUNTER — Ambulatory Visit (HOSPITAL_COMMUNITY)
Admission: RE | Admit: 2012-05-25 | Discharge: 2012-05-25 | Disposition: A | Discharge status: Home / Self Care | Payer: Medicare Other | Source: Ambulatory Visit | Attending: Internal Medicine | Admitting: Internal Medicine

## 2012-05-25 ENCOUNTER — Other Ambulatory Visit: Payer: Self-pay | Admitting: Internal Medicine

## 2012-05-25 ENCOUNTER — Ambulatory Visit
Admission: RE | Admit: 2012-05-25 | Discharge: 2012-05-25 | Disposition: A | Discharge status: Home / Self Care | Payer: Medicare Other | Source: Ambulatory Visit | Attending: Internal Medicine | Admitting: Internal Medicine

## 2012-05-25 DIAGNOSIS — R1310 Dysphagia, unspecified: Secondary | ICD-10-CM

## 2012-05-25 DIAGNOSIS — E0590 Thyrotoxicosis, unspecified without thyrotoxic crisis or storm: Secondary | ICD-10-CM | POA: Insufficient documentation

## 2012-05-25 DIAGNOSIS — I509 Heart failure, unspecified: Secondary | ICD-10-CM | POA: Insufficient documentation

## 2012-05-25 DIAGNOSIS — F329 Major depressive disorder, single episode, unspecified: Secondary | ICD-10-CM | POA: Insufficient documentation

## 2012-05-25 DIAGNOSIS — J449 Chronic obstructive pulmonary disease, unspecified: Secondary | ICD-10-CM | POA: Insufficient documentation

## 2012-05-25 DIAGNOSIS — R51 Headache: Secondary | ICD-10-CM | POA: Insufficient documentation

## 2012-05-25 DIAGNOSIS — R0602 Shortness of breath: Secondary | ICD-10-CM | POA: Insufficient documentation

## 2012-05-25 DIAGNOSIS — E039 Hypothyroidism, unspecified: Secondary | ICD-10-CM | POA: Insufficient documentation

## 2012-05-25 DIAGNOSIS — F3289 Other specified depressive episodes: Secondary | ICD-10-CM | POA: Insufficient documentation

## 2012-05-25 DIAGNOSIS — I10 Essential (primary) hypertension: Secondary | ICD-10-CM | POA: Insufficient documentation

## 2012-05-25 DIAGNOSIS — J4489 Other specified chronic obstructive pulmonary disease: Secondary | ICD-10-CM | POA: Insufficient documentation

## 2012-05-25 DIAGNOSIS — M129 Arthropathy, unspecified: Secondary | ICD-10-CM | POA: Insufficient documentation

## 2012-05-25 DIAGNOSIS — R29810 Facial weakness: Secondary | ICD-10-CM

## 2012-05-25 DIAGNOSIS — R1313 Dysphagia, pharyngeal phase: Secondary | ICD-10-CM | POA: Insufficient documentation

## 2012-05-25 DIAGNOSIS — F411 Generalized anxiety disorder: Secondary | ICD-10-CM | POA: Insufficient documentation

## 2012-05-25 DIAGNOSIS — K219 Gastro-esophageal reflux disease without esophagitis: Secondary | ICD-10-CM | POA: Insufficient documentation

## 2012-05-25 DIAGNOSIS — E119 Type 2 diabetes mellitus without complications: Secondary | ICD-10-CM | POA: Insufficient documentation

## 2012-05-25 NOTE — Procedures (Signed)
Objective Swallowing Evaluation: Modified Barium Swallowing Study  Patient Details  Name: Paula [name] MRN: [id number] Date of Birth: February 21, 1927  Today's Date: 05/25/2012 Time: 1110-1135 SLP Time Calculation (min): 25 min  Past Medical History:  Past Medical History  Diagnosis Date  . Diabetes mellitus   . COPD (chronic obstructive pulmonary disease)   . Hypertension   . Shortness of breath   . Hypothyroidism   . Depression   . Recurrent upper respiratory infection (URI)   . Seizures   . Ejection fraction     EF 40%, echo, November, 2012  / in improved, EF 60%, echo, December, 2012  . Abnormal EKG     November, 2012  . Contrast media allergy     Patient feels poorly with contrast  . Status post AAA (abdominal aortic aneurysm) repair     Surgical repair, Dr. Hart Rochester, December 27, 2011  . Pneumonia     10/2011  . Pre-syncope     vasovagal w/ heart block  . Parotid mass     has refused further eval 10/2011  . Chest pain at rest     Myoview 10/14/2011: ef 60%, ? dist anteroseptal scar w/ sublte peri-infarct ischemia vs. attenuation  . Hyperthyroidism   . CHF (congestive heart failure)   . GERD (gastroesophageal reflux disease)   . Headache   . Arthritis   . Anxiety   . Preop cardiovascular exam     Abdominal aortic aneurysm repair by Dr.Lawson  . PONV (postoperative nausea and vomiting)   . Facial tic     L sided  . Macular degeneration    Past Surgical History:  Past Surgical History  Procedure Date  . Cholecystectomy   . Joint replacement   . Sinus surgery with instatrak   . Appendectomy   . Oophorectomy   . Arthroscopic repair acl   . Eye surgery   . Cardiac catheterization 11/04/11  . Bladder repair   . Hernia repair     abdominal  . US echocardiography 10/14/11  . Abdominal aortic aneurysm repair 12/27/2011    Procedure: ANEURYSM ABDOMINAL AORTIC REPAIR;  Surgeon: Josephina Gip, MD;  Location: [facility] OR;  Service: Vascular;  Laterality: N/A;  Resection and  Grafting Abdominal Aortic Aneurysm , Aorta Bi Iliac.   HPI:  Pt is an 78 year old female arriving for an outpatient MBS. The pt has had two years of left facial/labial weakness, increasing in the past few days. She has had difficutly walking, facial tremor, memory loss. According to the pt and her husband theer is not neurologic diagnosis at this time though the pt has been seen by a neurologist. The pt also reports Acid reflux, waking in the night coughing, voice disturbance with polyps on her vocal folds, diagnosed by Dr. Pollyann Kennedy. She denies any coughing with liquids or other swallowing difficulty.      Assessment / Plan / Recommendation Clinical Impression  Dysphagia Diagnosis: Mild pharyngeal phase dysphagia Clinical impression: Pt presents with a mild pharyngeal sensorimotor deficit with decreased epiglottic closure of the vestibule, allow for flash and trace frank penetration of thin liquids during the swallow. The pt has no sensation of penetrates though a cues throat clear removes stasis in the vestibule. No material passed the vocal folds. An esophageal sweep revealed appearance of solid stasis in distal esophagus, with some upward surging of thin liquids. The pt has complaints consistent with some diagnosed acid reflux which could result in decreased laryngeal sensation if pt has nightly laryngopharyngeal  reflux.  At this time the pt is recommended to continue a regular diet with thin liquids with an intermittent throat clear to remove residual and follow esophageal precautions.     Treatment Recommendation  No treatment recommended at this time    Diet Recommendation Regular;Thin liquid   Liquid Administration via: Cup;Straw Medication Administration: Whole meds with liquid Supervision: Patient able to self feed Compensations: Slow rate;Small sips/bites;Clear throat intermittently Postural Changes and/or Swallow Maneuvers: Seated upright 90 degrees;Upright 30-60 min after meal    Other   Recommendations     Follow Up Recommendations  None    Frequency and Duration        Pertinent Vitals/Pain NA    SLP Swallow Goals     General HPI: Pt is an 78 year old female arriving for an outpatient MBS. The pt has had two years of left facial/labial weakness, increasing in the past few days. She has had difficutly walking, facial tremor, memory loss. According to the pt and her husband theer is not neurologic diagnosis at this time though the pt has been seen by a neurologist. The pt also reports Acid reflux, waking in the night coughing, voice disturbance with polyps on her vocal folds, diagnosed by Dr. Pollyann Kennedy. She denies any coughing with liquids or other swallowing difficulty.  Type of Study: Modified Barium Swallowing Study Reason for Referral: Objectively evaluate swallowing function Diet Prior to this Study: Regular;Thin liquids Temperature Spikes Noted: N/A Respiratory Status: Room air History of Recent Intubation: No Behavior/Cognition: Alert;Cooperative;Pleasant mood Oral Cavity - Dentition: Adequate natural dentition Oral Motor / Sensory Function: Impaired motor Oral impairment: Left labial Self-Feeding Abilities: Able to feed self Patient Positioning: Upright in chair Baseline Vocal Quality: Hoarse Volitional Cough: Strong Volitional Swallow: Able to elicit Anatomy: Within functional limits Pharyngeal Secretions: Not observed secondary MBS    Reason for Referral Objectively evaluate swallowing function   Oral Phase     Pharyngeal Phase Pharyngeal Phase: Impaired   Cervical Esophageal Phase Cervical Esophageal Phase: [facility]   Harlon Ditty, MA CCC-SLP [phone]  Claudine Mouton 05/25/2012, 2:00 PM

## 2012-07-06 ENCOUNTER — Encounter: Payer: Self-pay | Admitting: Vascular Surgery

## 2012-07-07 ENCOUNTER — Ambulatory Visit (INDEPENDENT_AMBULATORY_CARE_PROVIDER_SITE_OTHER): Payer: Medicare Other | Admitting: Vascular Surgery

## 2012-07-07 ENCOUNTER — Encounter: Payer: Self-pay | Admitting: Vascular Surgery

## 2012-07-07 VITALS — BP 146/80 | HR 102 | Resp 20 | Ht 65.0 in | Wt 164.0 lb

## 2012-07-07 DIAGNOSIS — R109 Unspecified abdominal pain: Secondary | ICD-10-CM

## 2012-07-07 DIAGNOSIS — I714 Abdominal aortic aneurysm, without rupture: Secondary | ICD-10-CM

## 2012-07-07 NOTE — Progress Notes (Signed)
Subjective:     Patient ID: Paula [name], female   DOB: Sep 29, 1927, 78 y.o.   MRN: [id number]  HPI this 78 year old female was referred by Dr. Nicholos Johns for possible ventral hernia with abdominal pain. The patient had resection and grafting of abdominal aortic aneurysm with aortobiiliac graft done by me in February 2013. She had a slow postoperative course but has not had further nausea and vomiting or regular basis. She does have some difficulty swallowing she states. She has a strange sensation around her neck and upper chest at times. She is not losing weight.  Past Medical History  Diagnosis Date  . Diabetes mellitus   . COPD (chronic obstructive pulmonary disease)   . Hypertension   . Shortness of breath   . Hypothyroidism   . Depression   . Recurrent upper respiratory infection (URI)   . Seizures   . Ejection fraction     EF 40%, echo, November, 2012  / in improved, EF 60%, echo, December, 2012  . Abnormal EKG     November, 2012  . Contrast media allergy     Patient feels poorly with contrast  . Status post AAA (abdominal aortic aneurysm) repair     Surgical repair, Dr. Hart Rochester, December 27, 2011  . Pneumonia     10/2011  . Pre-syncope     vasovagal w/ heart block  . Parotid mass     has refused further eval 10/2011  . Chest pain at rest     Myoview 10/14/2011: ef 60%, ? dist anteroseptal scar w/ sublte peri-infarct ischemia vs. attenuation  . Hyperthyroidism   . CHF (congestive heart failure)   . GERD (gastroesophageal reflux disease)   . Headache   . Arthritis   . Anxiety   . Preop cardiovascular exam     Abdominal aortic aneurysm repair by Dr.Lawson  . PONV (postoperative nausea and vomiting)   . Facial tic     L sided  . Macular degeneration     History  Substance Use Topics  . Smoking status: Former Smoker -- 1.0 packs/day for 65 years    Types: Cigarettes    Quit date: 08/31/2011  . Smokeless tobacco: Never Used  . Alcohol Use: No    Family History    Problem Relation Age of Onset  . Cancer Father     Allergies  Allergen Reactions  . Contrast Media (Iodinated Diagnostic Agents) Anaphylaxis  . Iohexol Shortness Of Breath and Swelling     Desc: PT STATES SHE HAD A SEVERE REACTION TO IV CONRAST WITH THROAT SWELLING AND SOB. SHE WAS ADMITTED TO THE HOSPITAL. SHE HAS NEVER HAD CONTRAST AGAIN.   Marland Kitchen Penicillins Rash  . Sulfa Antibiotics Anaphylaxis  . Baclofen     Current outpatient prescriptions:ALPRAZolam (XANAX) 0.5 MG tablet, Take 0.5 mg by mouth 3 (three) times daily as needed. For anxiety, Disp: , Rfl: ;  aspirin EC 81 MG tablet, Take 81 mg by mouth daily., Disp: , Rfl: ;  CALCIUM-VITAMIN D PO, Take by mouth., Disp: , Rfl: ;  hydrochlorothiazide (HYDRODIURIL) 25 MG tablet, Take 25 mg by mouth daily., Disp: , Rfl:  levothyroxine (SYNTHROID, LEVOTHROID) 175 MCG tablet, Take 175 mcg by mouth daily., Disp: , Rfl: ;  pantoprazole (PROTONIX) 40 MG tablet, Take 1 tablet by mouth daily., Disp: , Rfl: ;  acetaminophen (TYLENOL) 500 MG tablet, Take 500 mg by mouth 3 (three) times daily., Disp: , Rfl: ;  buPROPion (WELLBUTRIN XL) 300 MG 24 hr tablet, Take  300 mg by mouth daily., Disp: , Rfl:   BP 146/80  Pulse 102  Resp 20  Ht 5\' 5"  (1.651 m)  Wt 164 lb (74.39 kg)  BMI 27.29 kg/m2  Body mass index is 27.29 kg/(m^2).           Review of Systems denies chest pain , claudication, lateralizing weakness. Does have orthopnea and dyspnea on exertion as well as wheezing and generalized weakness in arms and legs. Other systems are negative and review of systems    Objective:   Physical Exam blood pressure 146/80 heart rate 102 respirations 20 Gen.-alert and oriented x3 in no apparent distress HEENT normal for age Lungs no rhonchi or wheezing Cardiovascular regular rhythm no murmurs carotid pulses 3+ palpable no bruits audible Abdomen soft nontender no palpable masses-no evidence of ventral hernia on physical exam. Fascial closure seems  intact. No significant tenderness. Musculoskeletal free of  major deformities Skin clear -no rashes Neurologic normal Lower extremities 3+ femoral and dorsalis pedis pulses palpable bilaterally with no edema       Assessment:     6 months status post resection and grafting of abdominal aortic aneurysm-no evidence of ventral hernia on physical exam Patient does have strange sensation in neck and face area as well as difficulty swallowing at times    Plan:     Would recommend GI consultation Return to see me on when necessary basis

## 2012-07-08 ENCOUNTER — Encounter: Payer: Self-pay | Admitting: Internal Medicine

## 2012-08-01 ENCOUNTER — Encounter (HOSPITAL_COMMUNITY): Payer: Self-pay

## 2012-08-01 ENCOUNTER — Emergency Department (HOSPITAL_COMMUNITY): Payer: Medicare Other

## 2012-08-01 ENCOUNTER — Emergency Department (HOSPITAL_COMMUNITY)
Admission: EM | Admit: 2012-08-01 | Discharge: 2012-08-01 | Disposition: A | Discharge status: Home / Self Care | Payer: Medicare Other | Attending: Emergency Medicine | Admitting: Emergency Medicine

## 2012-08-01 DIAGNOSIS — G514 Facial myokymia: Secondary | ICD-10-CM

## 2012-08-01 DIAGNOSIS — E039 Hypothyroidism, unspecified: Secondary | ICD-10-CM | POA: Insufficient documentation

## 2012-08-01 DIAGNOSIS — R29810 Facial weakness: Secondary | ICD-10-CM | POA: Insufficient documentation

## 2012-08-01 DIAGNOSIS — I10 Essential (primary) hypertension: Secondary | ICD-10-CM | POA: Insufficient documentation

## 2012-08-01 DIAGNOSIS — G518 Other disorders of facial nerve: Secondary | ICD-10-CM

## 2012-08-01 DIAGNOSIS — E119 Type 2 diabetes mellitus without complications: Secondary | ICD-10-CM | POA: Insufficient documentation

## 2012-08-01 LAB — DIFFERENTIAL
Basophils Absolute: 0.1 10*3/uL (ref 0.0–0.1)
Basophils Relative: 1 % (ref 0–1)
Eosinophils Absolute: 0.3 10*3/uL (ref 0.0–0.7)
Monocytes Absolute: 0.8 10*3/uL (ref 0.1–1.0)
Neutro Abs: 8.2 10*3/uL — ABNORMAL HIGH (ref 1.7–7.7)

## 2012-08-01 LAB — COMPREHENSIVE METABOLIC PANEL
AST: 17 U/L (ref 0–37)
Albumin: 3.7 g/dL (ref 3.5–5.2)
Calcium: 9.6 mg/dL (ref 8.4–10.5)
Chloride: 100 mEq/L (ref 96–112)
Creatinine, Ser: 0.87 mg/dL (ref 0.50–1.10)
Total Bilirubin: 0.4 mg/dL (ref 0.3–1.2)
Total Protein: 6.1 g/dL (ref 6.0–8.3)

## 2012-08-01 LAB — CBC
HCT: 36.3 % (ref 36.0–46.0)
MCH: 32.9 pg (ref 26.0–34.0)
MCHC: 32.8 g/dL (ref 30.0–36.0)
RDW: 13.9 % (ref 11.5–15.5)

## 2012-08-01 LAB — PROTIME-INR: INR: 1.18 (ref 0.00–1.49)

## 2012-08-01 LAB — APTT: aPTT: 45 seconds — ABNORMAL HIGH (ref 24–37)

## 2012-08-01 LAB — TROPONIN I: Troponin I: <0.3 ng/mL (ref <0.30)

## 2012-08-01 NOTE — Consult Note (Signed)
[facility] CONSULT NOTE     Reason for Consult: Facial asymmetry with left facial spasm.    HPI:    Paula [name] is an 78 y.o. female with a history of facial tic, abdominal aortic aneurysm repair, equivocal history of seizure disorder and macular degeneration, who was brought to the emergency room and code stroke status because of facial asymmetry and equivocal weakness involving left side. Patient denies focal weakness. She had a drawing sensation involving the left side of her face which was worse than usual and was persisting longer than usual as well, which is why she called EMS. She takes Xanax for facial tic which helps to some extent. Is an equivocal history of focal seizure disorder. Patient's currently on no anticonvulsant medication. She was given a trial of treatment with Keppra at one point but couldn't tolerate the medication because of nausea, even at low doses. Facial spasm resolved after arriving in the emergency room. CT scan of her head showed no acute intracranial abnormality.  Past Medical History  Diagnosis Date  . Diabetes mellitus   . COPD (chronic obstructive pulmonary disease)   . Hypertension   . Shortness of breath   . Hypothyroidism   . Depression   . Recurrent upper respiratory infection (URI)   . Seizures   . Ejection fraction     EF 40%, echo, November, 2012  / in improved, EF 60%, echo, December, 2012  . Abnormal EKG     November, 2012  . Contrast media allergy     Patient feels poorly with contrast  . Status post AAA (abdominal aortic aneurysm) repair     Surgical repair, Dr. Hart Rochester, December 27, 2011  . Pneumonia     10/2011  . Pre-syncope     vasovagal w/ heart block  . Parotid mass     has refused further eval 10/2011  . Chest pain at rest     Myoview 10/14/2011: ef 60%, ? dist anteroseptal scar w/ sublte peri-infarct ischemia vs. attenuation  . Hyperthyroidism   . CHF (congestive heart failure)   . GERD  (gastroesophageal reflux disease)   . Headache   . Arthritis   . Anxiety   . Preop cardiovascular exam     Abdominal aortic aneurysm repair by Dr.Lawson  . PONV (postoperative nausea and vomiting)   . Facial tic     L sided  . Macular degeneration     Past Surgical History  Procedure Date  . Cholecystectomy   . Joint replacement   . Sinus surgery with instatrak   . Appendectomy   . Oophorectomy   . Arthroscopic repair acl   . Eye surgery   . Cardiac catheterization 11/04/11  . Bladder repair   . Hernia repair     abdominal  . US echocardiography 10/14/11  . Abdominal aortic aneurysm repair 12/27/2011    Procedure: ANEURYSM ABDOMINAL AORTIC REPAIR;  Surgeon: Josephina Gip, MD;  Location: [facility] OR;  Service: Vascular;  Laterality: N/A;  Resection and Grafting Abdominal Aortic Aneurysm , Aorta Bi Iliac.    Family History  Problem Relation Age of Onset  . Cancer Father     Social History:  reports that she quit smoking about 11 months ago. Her smoking use included Cigarettes. She has a 65 pack-year smoking history. She has never used smokeless tobacco. She reports that she does not  drink alcohol or use illicit drugs.  Allergies  Allergen Reactions  . Contrast Media (Iodinated Diagnostic Agents) Anaphylaxis  . Iohexol Shortness Of Breath and Swelling     Desc: PT STATES SHE HAD A SEVERE REACTION TO IV CONRAST WITH THROAT SWELLING AND SOB. SHE WAS ADMITTED TO THE HOSPITAL. SHE HAS NEVER HAD CONTRAST AGAIN.   Marland Kitchen Penicillins Rash  . Sulfa Antibiotics Anaphylaxis  . Baclofen     Medications:   Prior to Admission:  Tylenol 250 mg every 6 hours when necessary for pain Xanax 0.5 mg 3 times a day when necessary Aspirin 81 mg per day Calcium with vitamin D one tablet per day Hydrochlorothiazide 25 mg per day Synthroid 175 mcg per day Protonix 40 mg per day  Blood pressure 135/63, pulse 71, temperature 98.3 F (36.8 C), temperature source Oral, resp. rate 23, SpO2  92.00%.   Neurologic Examination:  Mental Status: Alert, oriented, thought content appropriate.  Speech fluent without evidence of aphasia. Able to follow commands without difficulty. Cranial Nerves: II-Visual fields were normal. III/IV/VI-Pupils were equal and reacted. Extraocular movements were full and conjugate.    V/VII-no facial numbness and no facial weakness. VIII-normal. X-normal speech. Motor: 5/5 bilaterally with normal tone and bulk Sensory: Normal throughout. Deep Tendon Reflexes: Normal and symmetric. Plantars: Mute bilaterally Cerebellar: Normal finger-to-nose testing.   Lab Results  Component Value Date/Time   CHOL 144 11/04/2011  6:57 AM    Results for orders placed during the hospital encounter of 08/01/12 (from the past 48 hour(s))  PROTIME-INR     Status: Normal   Collection Time   08/01/12  9:40 AM      Component Value Range Comment   Prothrombin Time 14.8  11.6 - 15.2 seconds    INR 1.18  0.00 - 1.49   APTT     Status: Abnormal   Collection Time   08/01/12  9:40 AM      Component Value Range Comment   aPTT 45 (*) 24 - 37 seconds   CBC     Status: Abnormal   Collection Time   08/01/12  9:40 AM      Component Value Range Comment   WBC 12.0 (*) 4.0 - 10.5 K/uL    RBC 3.62 (*) 3.87 - 5.11 MIL/uL    Hemoglobin 11.9 (*) 12.0 - 15.0 g/dL    HCT 40.9  81.1 - 91.4 %    MCV 100.3 (*) 78.0 - 100.0 fL    MCH 32.9  26.0 - 34.0 pg    MCHC 32.8  30.0 - 36.0 g/dL    RDW 78.2  95.6 - 21.3 %    Platelets 275  150 - 400 K/uL   DIFFERENTIAL     Status: Abnormal   Collection Time   08/01/12  9:40 AM      Component Value Range Comment   Neutrophils Relative 68  43 - 77 %    Neutro Abs 8.2 (*) 1.7 - 7.7 K/uL    Lymphocytes Relative 23  12 - 46 %    Lymphs Abs 2.7  0.7 - 4.0 K/uL    Monocytes Relative 6  3 - 12 %    Monocytes Absolute 0.8  0.1 - 1.0 K/uL    Eosinophils Relative 3  0 - 5 %    Eosinophils Absolute 0.3  0.0 - 0.7 K/uL    Basophils Relative 1  0 - 1  %    Basophils Absolute 0.1  0.0 - 0.1  K/uL   COMPREHENSIVE METABOLIC PANEL     Status: Abnormal   Collection Time   08/01/12  9:40 AM      Component Value Range Comment   Sodium 137  135 - 145 mEq/L    Potassium 3.5  3.5 - 5.1 mEq/L    Chloride 100  96 - 112 mEq/L    CO2 28  19 - 32 mEq/L    Glucose, Bld 157 (*) 70 - 99 mg/dL    BUN 13  6 - 23 mg/dL    Creatinine, Ser 1.30  0.50 - 1.10 mg/dL    Calcium 9.6  8.4 - 86.5 mg/dL    Total Protein 6.1  6.0 - 8.3 g/dL    Albumin 3.7  3.5 - 5.2 g/dL    AST 17  0 - 37 U/L    ALT 12  0 - 35 U/L    Alkaline Phosphatase 53  39 - 117 U/L    Total Bilirubin 0.4  0.3 - 1.2 mg/dL    GFR calc non Af Amer 60 (*) >90 mL/min    GFR calc Af Amer 69 (*) >90 mL/min   TROPONIN I     Status: Normal   Collection Time   08/01/12  9:48 AM      Component Value Range Comment   Troponin I <0.30  <0.30 ng/mL     Ct Head Wo Contrast  08/01/2012  *RADIOLOGY REPORT*  Clinical Data: Left facial droop and facial twitching.  CT HEAD WITHOUT CONTRAST  Technique:  Contiguous axial images were obtained from the base of the skull through the vertex without contrast.  Comparison: 05/25/2012  Findings: The ventricles are normal in overall configuration. There is ventricular and sulcal enlargement reflecting generalized atrophy, mild to moderate in severity and a stable.  No parenchymal masses or mass effect.  Patchy areas of white matter hypoattenuation are noted most consistent with chronic microvascular ischemic change.  No evidence of a recent transcortical infarct.  There are no extra-axial masses or abnormal fluid collections.  No intracranial hemorrhage.  Visualized sinuses and mastoid air cells are clear.  IMPRESSION: No acute intracranial abnormalities.  Atrophy and stable white matter chronic microvascular ischemic change.  Head CT report called to the emergency room physician, and the neurologist, Dr. Roseanne Reno, at the time of this dictation as part of a [facility]  protocol.   Original Report Authenticated By: Domenic Moras, M.D.     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 Patient's symptoms resolved at this point with no signs of residual movement abnormality involving left side of her face. No indications of TIA or acute stroke. Next  Recommendations: No changes were made in patient's current management. Follow up with patient's outpatient neurologist in the next week or 2 for possible medication adjustment. Patient has an appointment for repeat treatment with Botox but indicated she would be undergoing this treatment any more. No further neurological intervention is indicated at this time.  Venetia Maxon M.D. [facility] [phone]  08/01/2012, 12:13 PM

## 2012-08-01 NOTE — ED Provider Notes (Signed)
History     CSN: [id number]  Arrival date & time 08/01/12  4098   First MD Initiated Contact with Patient 08/01/12 [phone]      Chief Complaint  Patient presents with  . Facial Droop    (Consider location/radiation/quality/duration/timing/severity/associated sxs/prior treatment) The history is provided by the patient.   Patient here with worsening right-sided facial twitching which she has had a long standing history of. She has been diagnosed with trigeminal neuralgia. Is currently seeing a neurologist for this. Denies any weakness of her arms or legs. No confusion or headache. Posterior was initially called after being seen by the neurologist was canceled. She has no other complaints of at this time. Has used Xanax for this in the past but didn't take any this morning Past Medical History  Diagnosis Date  . Diabetes mellitus   . COPD (chronic obstructive pulmonary disease)   . Hypertension   . Shortness of breath   . Hypothyroidism   . Depression   . Recurrent upper respiratory infection (URI)   . Seizures   . Ejection fraction     EF 40%, echo, November, 2012  / in improved, EF 60%, echo, December, 2012  . Abnormal EKG     November, 2012  . Contrast media allergy     Patient feels poorly with contrast  . Status post AAA (abdominal aortic aneurysm) repair     Surgical repair, Dr. Hart Rochester, December 27, 2011  . Pneumonia     10/2011  . Pre-syncope     vasovagal w/ heart block  . Parotid mass     has refused further eval 10/2011  . Chest pain at rest     Myoview 10/14/2011: ef 60%, ? dist anteroseptal scar w/ sublte peri-infarct ischemia vs. attenuation  . Hyperthyroidism   . CHF (congestive heart failure)   . GERD (gastroesophageal reflux disease)   . Headache   . Arthritis   . Anxiety   . Preop cardiovascular exam     Abdominal aortic aneurysm repair by Dr.Lawson  . PONV (postoperative nausea and vomiting)   . Facial tic     L sided  . Macular degeneration      Past Surgical History  Procedure Date  . Cholecystectomy   . Joint replacement   . Sinus surgery with instatrak   . Appendectomy   . Oophorectomy   . Arthroscopic repair acl   . Eye surgery   . Cardiac catheterization 11/04/11  . Bladder repair   . Hernia repair     abdominal  . US echocardiography 10/14/11  . Abdominal aortic aneurysm repair 12/27/2011    Procedure: ANEURYSM ABDOMINAL AORTIC REPAIR;  Surgeon: Josephina Gip, MD;  Location: [facility] OR;  Service: Vascular;  Laterality: N/A;  Resection and Grafting Abdominal Aortic Aneurysm , Aorta Bi Iliac.    Family History  Problem Relation Age of Onset  . Cancer Father     History  Substance Use Topics  . Smoking status: Former Smoker -- 1.0 packs/day for 65 years    Types: Cigarettes    Quit date: 08/31/2011  . Smokeless tobacco: Never Used  . Alcohol Use: No    OB History    Grav Para Term Preterm Abortions TAB SAB Ect Mult Living                  Review of Systems  All other systems reviewed and are negative.    Allergies  Contrast media; Iohexol; Penicillins; Sulfa antibiotics; and Baclofen  Home Medications   Current Outpatient Rx  Name Route Sig Dispense Refill  . ACETAMINOPHEN 500 MG PO TABS Oral Take 250 mg by mouth every 6 (six) hours as needed. For pain    . ALPRAZOLAM 0.5 MG PO TABS Oral Take 0.5 mg by mouth 3 (three) times daily as needed. For anxiety    . ASPIRIN EC 81 MG PO TBEC Oral Take 81 mg by mouth daily.    Marland Kitchen CALCIUM-VITAMIN D PO Oral Take 1 tablet by mouth every other day.     Marland Kitchen HYDROCHLOROTHIAZIDE 25 MG PO TABS Oral Take 25 mg by mouth daily.    Marland Kitchen LEVOTHYROXINE SODIUM 175 MCG PO TABS Oral Take 175 mcg by mouth daily.    Marland Kitchen PANTOPRAZOLE SODIUM 40 MG PO TBEC Oral Take 1 tablet by mouth daily.      BP 131/66  Pulse 74  Temp 97.9 F (36.6 C) (Oral)  Resp 17  SpO2 91%  Physical Exam  Nursing note and vitals reviewed. Constitutional: She is oriented to person, place, and time. Vital  signs are normal. She appears well-developed and well-nourished.  Non-toxic appearance. No distress.  HENT:  Head: Normocephalic and atraumatic.  Eyes: Conjunctivae normal, EOM and lids are normal. Pupils are equal, round, and reactive to light.  Neck: Normal range of motion. Neck supple. No tracheal deviation present. No mass present.  Cardiovascular: Normal rate, regular rhythm and normal heart sounds.  Exam reveals no gallop.   No murmur heard. Pulmonary/Chest: Effort normal and breath sounds normal. No stridor. No respiratory distress. She has no decreased breath sounds. She has no wheezes. She has no rhonchi. She has no rales.  Abdominal: Soft. Normal appearance and bowel sounds are normal. She exhibits no distension. There is no tenderness. There is no rebound and no CVA tenderness.  Musculoskeletal: Normal range of motion. She exhibits no edema and no tenderness.  Neurological: She is alert and oriented to person, place, and time. She has normal strength. She displays no tremor. No cranial nerve deficit or sensory deficit. GCS eye subscore is 4. GCS verbal subscore is 5. GCS motor subscore is 6.  Skin: Skin is warm and dry. No abrasion and no rash noted.  Psychiatric: She has a normal mood and affect. Her speech is normal and behavior is normal.    ED Course  Procedures (including critical care time)  Labs Reviewed  APTT - Abnormal; Notable for the following:    aPTT 45 (*)     All other components within normal limits  CBC - Abnormal; Notable for the following:    WBC 12.0 (*)     RBC 3.62 (*)     Hemoglobin 11.9 (*)     MCV 100.3 (*)     All other components within normal limits  DIFFERENTIAL - Abnormal; Notable for the following:    Neutro Abs 8.2 (*)     All other components within normal limits  COMPREHENSIVE METABOLIC PANEL - Abnormal; Notable for the following:    Glucose, Bld 157 (*)     GFR calc non Af Amer 60 (*)     GFR calc Af Amer 69 (*)     All other components  within normal limits  PROTIME-INR  TROPONIN I   Ct Head Wo Contrast  08/01/2012  *RADIOLOGY REPORT*  Clinical Data: Left facial droop and facial twitching.  CT HEAD WITHOUT CONTRAST  Technique:  Contiguous axial images were obtained from the base of the skull through the  vertex without contrast.  Comparison: 05/25/2012  Findings: The ventricles are normal in overall configuration. There is ventricular and sulcal enlargement reflecting generalized atrophy, mild to moderate in severity and a stable.  No parenchymal masses or mass effect.  Patchy areas of white matter hypoattenuation are noted most consistent with chronic microvascular ischemic change.  No evidence of a recent transcortical infarct.  There are no extra-axial masses or abnormal fluid collections.  No intracranial hemorrhage.  Visualized sinuses and mastoid air cells are clear.  IMPRESSION: No acute intracranial abnormalities.  Atrophy and stable white matter chronic microvascular ischemic change.  Head CT report called to the emergency room physician, and the neurologist, Dr. Roseanne Reno, at the time of this dictation as part of a [facility] protocol.   Original Report Authenticated By: Domenic Moras, M.D.      No diagnosis found.    MDM  His workup. Negative. Spoke with Dr. Roseanne Reno, the neurologist on call was also seen the patient. And he agrees that the patient has not had a stroke. Suspect that her current symptoms are chronic in nature and recommends the patient be discharged to followup with her neurologist next week        Toy Baker, MD 08/01/12 1044

## 2012-08-01 NOTE — ED Notes (Signed)
EMS reports patient eating breakfast at 0800, began having numbness and twiching of the left face and now a facial droop on the left, no arm or leg weakness

## 2012-08-04 ENCOUNTER — Encounter: Payer: Self-pay | Admitting: Internal Medicine

## 2012-08-05 ENCOUNTER — Encounter: Payer: Self-pay | Admitting: Internal Medicine

## 2012-08-05 ENCOUNTER — Ambulatory Visit (INDEPENDENT_AMBULATORY_CARE_PROVIDER_SITE_OTHER): Payer: Medicare Other | Admitting: Internal Medicine

## 2012-08-05 VITALS — BP 118/60 | HR 88 | Ht 65.0 in | Wt 167.5 lb

## 2012-08-05 DIAGNOSIS — R1313 Dysphagia, pharyngeal phase: Secondary | ICD-10-CM

## 2012-08-05 DIAGNOSIS — R29810 Facial weakness: Secondary | ICD-10-CM

## 2012-08-05 DIAGNOSIS — K219 Gastro-esophageal reflux disease without esophagitis: Secondary | ICD-10-CM

## 2012-08-05 MED ORDER — PANTOPRAZOLE SODIUM 40 MG PO TBEC: 40.0000 mg | DELAYED_RELEASE_TABLET | Freq: Every day | ORAL | Status: DC

## 2012-08-05 NOTE — Progress Notes (Signed)
Patient ID: Paula [name], female   DOB: 1927/04/30, 78 y.o.   MRN: [id number]  SUBJECTIVE: HPI Paula [name] is an 78 yo female with PMH of IBD/diabetes/hypertension/abdominal aortic aneurysm status post repair in February 2013, parotid mass, and GERD who is seen in consultation at the request of Dr. Hart Rochester for evaluation of dysphagia. The patient reports issues with swallowing over the past year. She reports her biggest issue is with initiating her swallow and feeling like there is a fullness in her left neck and left face. She reports left facial numbness and at times weakness. This seems to worsen as the day goes on. She denies issues with food getting stuck once it is swallowed, but more trouble getting food to the back of her mouth. She has some mild nausea which is very intermittent but no vomiting. She denies abdominal pain. She denies heartburn. She denies odynophagia. She reports normal bowel habits without blood or melena. No fevers or chills.  She has an upcoming neurology appointment tomorrow and wishes to discuss imaging done recently, along with the recommendation made by ER neurologist for an EEG  Review of Systems  As per history of present illness, otherwise negative   Past Medical History  Diagnosis Date  . Diabetes mellitus   . COPD (chronic obstructive pulmonary disease)   . Hypertension   . Shortness of breath   . Depression   . Recurrent upper respiratory infection (URI)   . Seizures   . Ejection fraction     EF 40%, echo, November, 2012  / in improved, EF 60%, echo, December, 2012  . Abnormal EKG     November, 2012  . Contrast media allergy     Patient feels poorly with contrast  . Status post AAA (abdominal aortic aneurysm) repair     Surgical repair, Dr. Hart Rochester, December 27, 2011  . Pneumonia     10/2011  . Pre-syncope     vasovagal w/ heart block  . Parotid mass     has refused further eval 10/2011  . Chest pain at rest     Myoview 10/14/2011: ef 60%, ? dist  anteroseptal scar w/ sublte peri-infarct ischemia vs. attenuation  . Hyperthyroidism   . CHF (congestive heart failure)   . GERD (gastroesophageal reflux disease)   . Headache   . Arthritis   . Anxiety   . Preop cardiovascular exam     Abdominal aortic aneurysm repair by Dr.Lawson  . PONV (postoperative nausea and vomiting)   . Facial tic     L sided  . Macular degeneration     Current Outpatient Prescriptions  Medication Sig Dispense Refill  . acetaminophen (TYLENOL) 500 MG tablet Take 250 mg by mouth every 6 (six) hours as needed. For pain      . ALPRAZolam (XANAX) 0.5 MG tablet Take 0.5 mg by mouth 3 (three) times daily as needed. For anxiety      . aspirin EC 81 MG tablet Take 81 mg by mouth daily.      Marland Kitchen CALCIUM-VITAMIN D PO Take 1 tablet by mouth every other day.       . hydrochlorothiazide (HYDRODIURIL) 25 MG tablet Take 25 mg by mouth daily.      Marland Kitchen levothyroxine (SYNTHROID, LEVOTHROID) 175 MCG tablet Take 175 mcg by mouth daily.      . pantoprazole (PROTONIX) 40 MG tablet Take 1 tablet (40 mg total) by mouth daily.  30 tablet  3    Allergies  Allergen Reactions  .  Contrast Media (Iodinated Diagnostic Agents) Anaphylaxis  . Iohexol Shortness Of Breath and Swelling     Desc: PT STATES SHE HAD A SEVERE REACTION TO IV CONRAST WITH THROAT SWELLING AND SOB. SHE WAS ADMITTED TO THE HOSPITAL. SHE HAS NEVER HAD CONTRAST AGAIN.   Marland Kitchen Penicillins Rash  . Sulfa Antibiotics Anaphylaxis  . Baclofen     Family History  Problem Relation Age of Onset  . Esophageal cancer Father   . Breast cancer Sister   . Colon cancer Sister   . Heart disease Sister   . Heart disease Mother     History  Substance Use Topics  . Smoking status: Former Smoker -- 1.0 packs/day for 65 years    Types: Cigarettes    Quit date: 08/31/2011  . Smokeless tobacco: Never Used  . Alcohol Use: No    OBJECTIVE: BP 118/60  Pulse 88  Ht 5\' 5"  (1.651 m)  Wt 167 lb 8 oz (75.978 kg)  BMI 27.87  kg/m2 Constitutional: Well-developed and well-nourished. No distress. HEENT: Normocephalic and atraumatic, with mild left facial droop and weakness. Oropharynx is clear and moist. No oropharyngeal exudate. Conjunctivae are normal. Pupils are equal round and reactive to light. No scleral icterus. Neck: Fullness in the left neck over the parotid gland Cardiovascular: Normal rate, regular rhythm and intact distal pulses. No M/R/G Pulmonary/chest: Somewhat distant but clear without rales or rhonchi Abdominal: Soft, nontender, nondistended. Bowel sounds active throughout. There are no masses palpable. No hepatosplenomegaly. Well-healed midline abdominal scar Extremities: no clubbing, cyanosis, or edema Lymphadenopathy: No cervical adenopathy noted. Neurological: Alert and oriented to person place and time. Left facial droop/weakness Skin: Skin is warm and dry. No rashes noted. Psychiatric: Normal mood and affect. Behavior is normal.  Labs and Imaging -- CBC    Component Value Date/Time   WBC 12.0* 08/01/2012 0940   RBC 3.62* 08/01/2012 0940   HGB 11.9* 08/01/2012 0940   HCT 36.3 08/01/2012 0940   PLT 275 08/01/2012 0940   MCV 100.3* 08/01/2012 0940   MCH 32.9 08/01/2012 0940   MCHC 32.8 08/01/2012 0940   RDW 13.9 08/01/2012 0940   LYMPHSABS 2.7 08/01/2012 0940   MONOABS 0.8 08/01/2012 0940   EOSABS 0.3 08/01/2012 0940   BASOSABS 0.1 08/01/2012 0940    CMP     Component Value Date/Time   NA 137 08/01/2012 0940   K 3.5 08/01/2012 0940   CL 100 08/01/2012 0940   CO2 28 08/01/2012 0940   GLUCOSE 157* 08/01/2012 0940   BUN 13 08/01/2012 0940   CREATININE 0.87 08/01/2012 0940   CREATININE 0.80 12/23/2011 0824   CALCIUM 9.6 08/01/2012 0940   PROT 6.1 08/01/2012 0940   ALBUMIN 3.7 08/01/2012 0940   AST 17 08/01/2012 0940   ALT 12 08/01/2012 0940   ALKPHOS 53 08/01/2012 0940   BILITOT 0.4 08/01/2012 0940   GFRNONAA 60* 08/01/2012 0940   GFRAA 69* 08/01/2012 0940    ASSESSMENT AND PLAN: 78 yo female with  PMH of IBD/diabetes/hypertension/abdominal aortic aneurysm status post repair in February 2013, parotid mass, and GERD who is seen in consultation at the request of Dr. Hart Rochester for evaluation of dysphagia.   1. Dysphagia, likely pharyngeal phase -- the patient had a modified barium swallow in July 2013 which showed mild pharyngeal phase dysphagia with sensorimotor deficit and decreased epiglottic closure leading to frank penetration of thin liquids during swallow. Her symptoms are also most consistent with a pharyngeal problem. She has very little esophageal symptoms, and  denies heartburn and upper abdominal symptoms. We have discussed EGD including the risks and benefits, and have decided first order a barium swallow with tablet to better evaluate the entire esophagus and rule out stricture and less likely mass lesion.  If an anatomic esophageal issue is found, then we can proceed to EGD. If the esophagus appears normal, then she may need further speech and swallow therapy in an attempt to help retrain her pharyngeal swallowing dysfunction.  2.  GERD -- she remains on pantoprazole 40 mg daily, and at present is not having reflux symptoms. For now she will continue this medication  3.  Facial weakness -- she has an appointment with her neurologist tomorrow, and plans to discuss recent neurologic imaging, the need for possible EEG, and to further discuss her pharyngeal dysphagia.  Followup to be determined after barium swallow

## 2012-08-05 NOTE — Patient Instructions (Addendum)
You have been scheduled for a Barium swallow at [facility] on 08/10/2012 at 10:00am please arrive 15 minutes prior to your appointment.  If you cannot keep this appointment please call [phone]   Continue taking pantoprazole   At tomorrow's appointment please discuss the following: Facial numbness, the need for an EEG, Possible swallow therapy, clarification of head imaging.

## 2012-08-10 ENCOUNTER — Ambulatory Visit (HOSPITAL_COMMUNITY)
Admission: RE | Admit: 2012-08-10 | Discharge: 2012-08-10 | Disposition: A | Discharge status: Home / Self Care | Payer: Medicare Other | Source: Ambulatory Visit | Attending: Internal Medicine | Admitting: Internal Medicine

## 2012-08-10 DIAGNOSIS — K219 Gastro-esophageal reflux disease without esophagitis: Secondary | ICD-10-CM | POA: Insufficient documentation

## 2012-08-10 DIAGNOSIS — R1313 Dysphagia, pharyngeal phase: Secondary | ICD-10-CM

## 2012-08-10 DIAGNOSIS — K224 Dyskinesia of esophagus: Secondary | ICD-10-CM | POA: Insufficient documentation

## 2012-08-10 DIAGNOSIS — K449 Diaphragmatic hernia without obstruction or gangrene: Secondary | ICD-10-CM | POA: Insufficient documentation

## 2012-08-10 DIAGNOSIS — R1310 Dysphagia, unspecified: Secondary | ICD-10-CM | POA: Insufficient documentation

## 2012-08-24 ENCOUNTER — Encounter (HOSPITAL_COMMUNITY): Payer: Self-pay | Admitting: *Deleted

## 2012-08-24 ENCOUNTER — Emergency Department (HOSPITAL_COMMUNITY)
Admission: EM | Admit: 2012-08-24 | Discharge: 2012-08-24 | Disposition: A | Discharge status: Home / Self Care | Payer: Medicare Other | Attending: Emergency Medicine | Admitting: Emergency Medicine

## 2012-08-24 DIAGNOSIS — R110 Nausea: Secondary | ICD-10-CM | POA: Insufficient documentation

## 2012-08-24 DIAGNOSIS — F959 Tic disorder, unspecified: Secondary | ICD-10-CM | POA: Insufficient documentation

## 2012-08-24 DIAGNOSIS — E0590 Thyrotoxicosis, unspecified without thyrotoxic crisis or storm: Secondary | ICD-10-CM | POA: Insufficient documentation

## 2012-08-24 DIAGNOSIS — H3530 Unspecified macular degeneration: Secondary | ICD-10-CM | POA: Insufficient documentation

## 2012-08-24 DIAGNOSIS — R42 Dizziness and giddiness: Secondary | ICD-10-CM | POA: Insufficient documentation

## 2012-08-24 DIAGNOSIS — I440 Atrioventricular block, first degree: Secondary | ICD-10-CM | POA: Insufficient documentation

## 2012-08-24 DIAGNOSIS — J449 Chronic obstructive pulmonary disease, unspecified: Secondary | ICD-10-CM | POA: Insufficient documentation

## 2012-08-24 DIAGNOSIS — I509 Heart failure, unspecified: Secondary | ICD-10-CM | POA: Insufficient documentation

## 2012-08-24 DIAGNOSIS — F411 Generalized anxiety disorder: Secondary | ICD-10-CM | POA: Insufficient documentation

## 2012-08-24 DIAGNOSIS — I10 Essential (primary) hypertension: Secondary | ICD-10-CM | POA: Insufficient documentation

## 2012-08-24 DIAGNOSIS — Z87891 Personal history of nicotine dependence: Secondary | ICD-10-CM | POA: Insufficient documentation

## 2012-08-24 DIAGNOSIS — R531 Weakness: Secondary | ICD-10-CM

## 2012-08-24 DIAGNOSIS — K219 Gastro-esophageal reflux disease without esophagitis: Secondary | ICD-10-CM | POA: Insufficient documentation

## 2012-08-24 DIAGNOSIS — E119 Type 2 diabetes mellitus without complications: Secondary | ICD-10-CM | POA: Insufficient documentation

## 2012-08-24 DIAGNOSIS — R5381 Other malaise: Secondary | ICD-10-CM | POA: Insufficient documentation

## 2012-08-24 DIAGNOSIS — R51 Headache: Secondary | ICD-10-CM | POA: Insufficient documentation

## 2012-08-24 DIAGNOSIS — J4489 Other specified chronic obstructive pulmonary disease: Secondary | ICD-10-CM | POA: Insufficient documentation

## 2012-08-24 LAB — PROTIME-INR
INR: 1.06 (ref 0.00–1.49)
Prothrombin Time: 13.7 seconds (ref 11.6–15.2)

## 2012-08-24 LAB — COMPREHENSIVE METABOLIC PANEL
Alkaline Phosphatase: 52 U/L (ref 39–117)
BUN: 13 mg/dL (ref 6–23)
CO2: 28 mEq/L (ref 19–32)
Chloride: 101 mEq/L (ref 96–112)
Creatinine, Ser: 0.96 mg/dL (ref 0.50–1.10)
GFR calc non Af Amer: 53 mL/min — ABNORMAL LOW (ref >90)
Potassium: 3.3 mEq/L — ABNORMAL LOW (ref 3.5–5.1)
Total Bilirubin: 0.5 mg/dL (ref 0.3–1.2)

## 2012-08-24 LAB — CBC
HCT: 36.9 % (ref 36.0–46.0)
Hemoglobin: 12.1 g/dL (ref 12.0–15.0)
MCV: 98.4 fL (ref 78.0–100.0)
RBC: 3.75 MIL/uL — ABNORMAL LOW (ref 3.87–5.11)
WBC: 10.1 10*3/uL (ref 4.0–10.5)

## 2012-08-24 MED ORDER — POTASSIUM CHLORIDE CRYS ER 20 MEQ PO TBCR
40.0000 meq | EXTENDED_RELEASE_TABLET | Freq: Once | ORAL | Status: AC
  Administered 2012-08-24: 40 meq via ORAL
  Filled 2012-08-24: qty 2

## 2012-08-24 MED ORDER — LORAZEPAM 2 MG/ML IJ SOLN
1.0000 mg | Freq: Once | INTRAMUSCULAR | Status: AC
  Administered 2012-08-24: 1 mg via INTRAVENOUS
  Filled 2012-08-24: qty 1

## 2012-08-24 NOTE — ED Notes (Signed)
EDP at bedside  

## 2012-08-24 NOTE — ED Provider Notes (Signed)
History     CSN: [id number]  Arrival date & time 08/24/12  [id number]   First MD Initiated Contact with Patient 08/24/12 0319      Chief Complaint  Patient presents with  . Anxiety  . Facial Twitching     (Consider location/radiation/quality/duration/timing/severity/associated sxs/prior treatment) HPI Comments: Paula [name] presents via EMS for evaluation.  She states she felt intermittently bad or ill throughout the weekend.  This morning she felt a mild headache, nausea, and dizziness after using the bathroom.  She noticed twitching and twisting of her face.  She reports she has had issues with facial twitching and twisting over the last year and has been evaluated by multiple providers for the same condition.  She is concerned tonight because although she has been reassured that she will not die from this condition by her neurologist and primary physician, she feels as if it is increasing in frequency, duration, and intensity.  She denies visual changes, acute hearing loss or tinnitus, difficulty swallowing, fainting, CP, SOB, palpitations, abdominal pain, dysuria, unilateral weakness, or a gait disturbance.  The history is provided by the patient. No language interpreter was used.    Past Medical History  Diagnosis Date  . Diabetes mellitus   . COPD (chronic obstructive pulmonary disease)   . Hypertension   . Shortness of breath   . Depression   . Recurrent upper respiratory infection (URI)   . Seizures   . Ejection fraction     EF 40%, echo, November, 2012  / in improved, EF 60%, echo, December, 2012  . Abnormal EKG     November, 2012  . Contrast media allergy     Patient feels poorly with contrast  . Status post AAA (abdominal aortic aneurysm) repair     Surgical repair, Dr. Hart Rochester, December 27, 2011  . Pneumonia     10/2011  . Pre-syncope     vasovagal w/ heart block  . Parotid mass     has refused further eval 10/2011  . Chest pain at rest     Myoview 10/14/2011: ef 60%,  ? dist anteroseptal scar w/ sublte peri-infarct ischemia vs. attenuation  . Hyperthyroidism   . CHF (congestive heart failure)   . GERD (gastroesophageal reflux disease)   . Headache   . Arthritis   . Anxiety   . Preop cardiovascular exam     Abdominal aortic aneurysm repair by Dr.Lawson  . PONV (postoperative nausea and vomiting)   . Facial tic     L sided  . Macular degeneration     Past Surgical History  Procedure Date  . Cholecystectomy   . Knee cartilage surgery     left  . Sinus surgery with instatrak   . Appendectomy   . Oophorectomy     ovarian cyst  . Cataract extraction     bilateral  . Cardiac catheterization 11/04/11  . Bladder repair   . US echocardiography 10/14/11  . Abdominal aortic aneurysm repair 12/27/2011    Procedure: ANEURYSM ABDOMINAL AORTIC REPAIR;  Surgeon: Josephina Gip, MD;  Location: [facility] OR;  Service: Vascular;  Laterality: N/A;  Resection and Grafting Abdominal Aortic Aneurysm , Aorta Bi Iliac.    Family History  Problem Relation Age of Onset  . Esophageal cancer Father   . Breast cancer Sister   . Colon cancer Sister   . Heart disease Sister   . Heart disease Mother     History  Substance Use Topics  . Smoking status: Former Smoker --  1.0 packs/day for 65 years    Types: Cigarettes    Quit date: 08/31/2011  . Smokeless tobacco: Never Used  . Alcohol Use: No    OB History    Grav Para Term Preterm Abortions TAB SAB Ect Mult Living                  Review of Systems  Constitutional: Negative for fever, activity change, appetite change and fatigue.  HENT: Negative for hearing loss, congestion, sore throat, rhinorrhea, drooling, trouble swallowing, voice change and tinnitus.   Eyes: Negative for pain and visual disturbance.  Respiratory: Negative for cough, chest tightness and shortness of breath.   Cardiovascular: Negative for chest pain and palpitations.  Gastrointestinal: Positive for nausea. Negative for vomiting, abdominal pain  and diarrhea.  Genitourinary: Negative for dysuria and difficulty urinating.  Musculoskeletal: Positive for arthralgias. Negative for myalgias, back pain and gait problem.  Skin: Negative.   Neurological: Positive for dizziness, facial asymmetry, speech difficulty ("Talking outside of my mouth"), weakness, numbness ("My whole face, but worse on the left side") and headaches. Negative for tremors, syncope and light-headedness.  Psychiatric/Behavioral: The patient is nervous/anxious.     Allergies  Contrast media; Iohexol; Penicillins; Sulfa antibiotics; and Baclofen  Home Medications   Current Outpatient Rx  Name Route Sig Dispense Refill  . ALPRAZOLAM 0.5 MG PO TABS Oral Take 0.5 mg by mouth 3 (three) times daily. For anxiety    . ASPIRIN EC 81 MG PO TBEC Oral Take 81 mg by mouth daily.    Marland Kitchen CALCIUM-VITAMIN D PO Oral Take 1 tablet by mouth every other day.     Marland Kitchen HYDROCHLOROTHIAZIDE 25 MG PO TABS Oral Take 25 mg by mouth daily.    Marland Kitchen LEVOTHYROXINE SODIUM 175 MCG PO TABS Oral Take 175 mcg by mouth daily.    Marland Kitchen LOSARTAN POTASSIUM 25 MG PO TABS Oral Take 25 mg by mouth daily.    Marland Kitchen PANTOPRAZOLE SODIUM 40 MG PO TBEC Oral Take 1 tablet (40 mg total) by mouth daily. 30 tablet 3  . SERTRALINE HCL 50 MG PO TABS Oral Take 50 mg by mouth daily.    Marland Kitchen ONDANSETRON HCL 8 MG PO TABS Oral Take by mouth 3 (three) times daily as needed. For nausea      BP 144/60  Pulse 75  Temp 98.9 F (37.2 C) (Oral)  Resp 23  SpO2 96%  Physical Exam  Nursing note and vitals reviewed. Constitutional: She is oriented to person, place, and time. She appears well-developed and well-nourished. No distress.  HENT:  Head: Normocephalic and atraumatic.  Right Ear: External ear normal.  Left Ear: External ear normal.  Nose: Nose normal.  Mouth/Throat: Oropharynx is clear and moist. No oropharyngeal exudate.  Eyes: Conjunctivae normal and EOM are normal. Pupils are equal, round, and reactive to light. Right eye exhibits  no discharge. Left eye exhibits no discharge. No scleral icterus.  Neck: Normal range of motion. Neck supple. No JVD present. No tracheal deviation present. No thyromegaly present.  Cardiovascular: Normal rate, regular rhythm, normal heart sounds and intact distal pulses.  Exam reveals no gallop and no friction rub.   No murmur heard. Pulmonary/Chest: Effort normal and breath sounds normal. No stridor. No respiratory distress. She has no wheezes. She has no rales. She exhibits no tenderness.  Abdominal: Soft. Bowel sounds are normal. She exhibits no distension and no mass. There is no tenderness. There is no rebound and no guarding.  Musculoskeletal: Normal range of  motion. She exhibits no edema and no tenderness.  Lymphadenopathy:    She has no cervical adenopathy.  Neurological: She is alert and oriented to person, place, and time. She displays no atrophy and no tremor. No cranial nerve deficit. She exhibits normal muscle tone. She displays no seizure activity. Coordination normal. GCS eye subscore is 4. GCS verbal subscore is 5. GCS motor subscore is 6. She displays no Babinski's sign on the right side. She displays no Babinski's sign on the left side.       No significant facial asymmetry with mouth closed. Patient is rubbing the left side of her face. No possible droop of the right corner of her mouth when she is speaking. No significant twisting of the face noted.  Skin: Skin is warm, dry and intact. No rash noted. She is not diaphoretic. No cyanosis or erythema. No pallor. Nails show no clubbing.  Psychiatric: Her speech is normal and behavior is normal. Thought content normal. Her mood appears anxious. Cognition and memory are normal.    ED Course  Procedures (including critical care time)  Labs Reviewed - No data to display No results found.   No diagnosis found.   Date: 08/24/2012  Rate: 77 bpm  Rhythm: sinus  QRS Axis: normal  Intervals: PR prolonged  ST/T Wave abnormalities:  nonspecific T wave changes  Conduction Disutrbances:first-degree A-V block   Narrative Interpretation:   Old EKG Reviewed: No significant changes noted      MDM  Pt presents via EMS for evaluation of facial twitching and numbness.  Tonight it is associated with mild facial numbness, dizziness, and nausea.  She has experienced all of these symptoms previously.  Available medical hx reviewed.  She had a 9/21 eval by the on-call neurohospitalist.    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 Patient's symptoms resolved at this point with no signs of residual movement abnormality involving left side of her face.  No indications of TIA or acute stroke. Next  Recommendations:  No changes were made in patient's current management. Follow up with patient's outpatient neurologist in the next week or 2 for possible medication adjustment. Patient has an appointment for repeat treatment with Botox but indicated she would be undergoing this treatment any more. No further neurological intervention is indicated at this time.  Venetia Maxon M.D.  [facility]  [phone]  08/01/2012, 12:13 PM  She has had several CT scans of the head the last of which was performed on 9/21.  She has also had a swallow evaluation performed.  The last MRI on record was performed in 2012.    Pt currently appears stable, NAD.  She is anxious but has no focal deficit on exam.  Plan symptomatic care, routine labs, and reassessments.  After reviewing history, I am unsure if she would benefit from any imaging tonight.  I will discuss with the on-call neurohospitalist.  0730.  Discussed pt's hx and evaluation with Dr. Thad Ranger.  Based on the review of her recent evaluations, and pt's exam tonight, no further testing was recommended.  I discussed with Mrs.  [name] the chronic nature of tonight's complaints.  She insists, "There is something wrong with me and I know  it!"  I have encouraged outprt f/u with her PMD and with Dr. Vickey Huger.       Tobin Chad, MD 08/24/12 [phone]

## 2012-08-24 NOTE — ED Notes (Signed)
Per EMS: pt with ongoing sx of bilateral facial twitching x 1 year.  When going to bathroom tonight, noticed facial twitching.  No pain, no facial droop, no arm drift, no slurred speech.  Pt alert and oriented x 4.

## 2012-08-31 ENCOUNTER — Emergency Department (HOSPITAL_COMMUNITY): Payer: Medicare Other

## 2012-08-31 ENCOUNTER — Observation Stay (HOSPITAL_COMMUNITY)
Admission: EM | Admit: 2012-08-31 | Discharge: 2012-09-01 | Disposition: A | Discharge status: Home / Self Care | Payer: Medicare Other | Attending: Internal Medicine | Admitting: Internal Medicine

## 2012-08-31 ENCOUNTER — Encounter (HOSPITAL_COMMUNITY): Payer: Self-pay

## 2012-08-31 DIAGNOSIS — E039 Hypothyroidism, unspecified: Secondary | ICD-10-CM

## 2012-08-31 DIAGNOSIS — IMO0002 Reserved for concepts with insufficient information to code with codable children: Secondary | ICD-10-CM

## 2012-08-31 DIAGNOSIS — F329 Major depressive disorder, single episode, unspecified: Secondary | ICD-10-CM

## 2012-08-31 DIAGNOSIS — Z01810 Encounter for preprocedural cardiovascular examination: Secondary | ICD-10-CM

## 2012-08-31 DIAGNOSIS — Z91041 Radiographic dye allergy status: Secondary | ICD-10-CM

## 2012-08-31 DIAGNOSIS — Z8679 Personal history of other diseases of the circulatory system: Secondary | ICD-10-CM

## 2012-08-31 DIAGNOSIS — G514 Facial myokymia: Secondary | ICD-10-CM

## 2012-08-31 DIAGNOSIS — F419 Anxiety disorder, unspecified: Secondary | ICD-10-CM

## 2012-08-31 DIAGNOSIS — E119 Type 2 diabetes mellitus without complications: Secondary | ICD-10-CM

## 2012-08-31 DIAGNOSIS — R1313 Dysphagia, pharyngeal phase: Secondary | ICD-10-CM

## 2012-08-31 DIAGNOSIS — E876 Hypokalemia: Secondary | ICD-10-CM

## 2012-08-31 DIAGNOSIS — K118 Other diseases of salivary glands: Secondary | ICD-10-CM

## 2012-08-31 DIAGNOSIS — R109 Unspecified abdominal pain: Secondary | ICD-10-CM

## 2012-08-31 DIAGNOSIS — I714 Abdominal aortic aneurysm, without rupture, unspecified: Secondary | ICD-10-CM

## 2012-08-31 DIAGNOSIS — R9430 Abnormal result of cardiovascular function study, unspecified: Secondary | ICD-10-CM

## 2012-08-31 DIAGNOSIS — J439 Emphysema, unspecified: Secondary | ICD-10-CM

## 2012-08-31 DIAGNOSIS — R410 Disorientation, unspecified: Secondary | ICD-10-CM

## 2012-08-31 DIAGNOSIS — J189 Pneumonia, unspecified organism: Secondary | ICD-10-CM

## 2012-08-31 DIAGNOSIS — R112 Nausea with vomiting, unspecified: Secondary | ICD-10-CM

## 2012-08-31 DIAGNOSIS — R079 Chest pain, unspecified: Secondary | ICD-10-CM

## 2012-08-31 DIAGNOSIS — D72829 Elevated white blood cell count, unspecified: Secondary | ICD-10-CM

## 2012-08-31 DIAGNOSIS — J4489 Other specified chronic obstructive pulmonary disease: Secondary | ICD-10-CM | POA: Insufficient documentation

## 2012-08-31 DIAGNOSIS — R001 Bradycardia, unspecified: Secondary | ICD-10-CM

## 2012-08-31 DIAGNOSIS — R110 Nausea: Secondary | ICD-10-CM

## 2012-08-31 DIAGNOSIS — I509 Heart failure, unspecified: Secondary | ICD-10-CM

## 2012-08-31 DIAGNOSIS — R0602 Shortness of breath: Secondary | ICD-10-CM | POA: Insufficient documentation

## 2012-08-31 DIAGNOSIS — R9431 Abnormal electrocardiogram [ECG] [EKG]: Secondary | ICD-10-CM

## 2012-08-31 DIAGNOSIS — F32A Depression, unspecified: Secondary | ICD-10-CM

## 2012-08-31 DIAGNOSIS — R55 Syncope and collapse: Secondary | ICD-10-CM

## 2012-08-31 DIAGNOSIS — J449 Chronic obstructive pulmonary disease, unspecified: Secondary | ICD-10-CM | POA: Insufficient documentation

## 2012-08-31 LAB — CBC WITH DIFFERENTIAL/PLATELET
Lymphocytes Relative: 21 % (ref 12–46)
Lymphs Abs: 2.7 10*3/uL (ref 0.7–4.0)
MCV: 99.2 fL (ref 78.0–100.0)
Neutrophils Relative %: 66 % (ref 43–77)
Platelets: 291 10*3/uL (ref 150–400)
RBC: 3.64 MIL/uL — ABNORMAL LOW (ref 3.87–5.11)
WBC: 13.2 10*3/uL — ABNORMAL HIGH (ref 4.0–10.5)

## 2012-08-31 LAB — URINALYSIS, ROUTINE W REFLEX MICROSCOPIC
Bilirubin Urine: NEGATIVE
Hgb urine dipstick: NEGATIVE
Protein, ur: NEGATIVE mg/dL
Urobilinogen, UA: 0.2 mg/dL (ref 0.0–1.0)

## 2012-08-31 LAB — COMPREHENSIVE METABOLIC PANEL
ALT: 12 U/L (ref 0–35)
Alkaline Phosphatase: 63 U/L (ref 39–117)
CO2: 29 mEq/L (ref 19–32)
GFR calc Af Amer: 87 mL/min — ABNORMAL LOW (ref >90)
GFR calc non Af Amer: 75 mL/min — ABNORMAL LOW (ref >90)
Glucose, Bld: 85 mg/dL (ref 70–99)
Potassium: 4 mEq/L (ref 3.5–5.1)
Sodium: 140 mEq/L (ref 135–145)

## 2012-08-31 LAB — GLUCOSE, CAPILLARY: Glucose-Capillary: 226 mg/dL — ABNORMAL HIGH (ref 70–99)

## 2012-08-31 MED ORDER — PREDNISONE 20 MG PO TABS
40.0000 mg | ORAL_TABLET | Freq: Every day | ORAL | Status: DC
  Administered 2012-09-01: 40 mg via ORAL
  Filled 2012-08-31 (×2): qty 2

## 2012-08-31 MED ORDER — DIPHENHYDRAMINE HCL 50 MG/ML IJ SOLN
25.0000 mg | Freq: Once | INTRAMUSCULAR | Status: AC
  Administered 2012-08-31: 18:00:00 via INTRAVENOUS

## 2012-08-31 MED ORDER — LEVOTHYROXINE SODIUM 175 MCG PO TABS
175.0000 ug | ORAL_TABLET | Freq: Every day | ORAL | Status: DC
  Administered 2012-09-01: 175 ug via ORAL
  Filled 2012-08-31: qty 1

## 2012-08-31 MED ORDER — PANTOPRAZOLE SODIUM 40 MG PO TBEC
40.0000 mg | DELAYED_RELEASE_TABLET | Freq: Every day | ORAL | Status: DC
  Administered 2012-09-01: 40 mg via ORAL

## 2012-08-31 MED ORDER — DEXTROSE 5 % IV SOLN
500.0000 mg | INTRAVENOUS | Status: DC
  Administered 2012-08-31: 500 mg via INTRAVENOUS
  Filled 2012-08-31 (×2): qty 500

## 2012-08-31 MED ORDER — DIPHENHYDRAMINE HCL 50 MG/ML IJ SOLN
INTRAMUSCULAR | Status: AC
  Filled 2012-08-31: qty 1

## 2012-08-31 MED ORDER — ALBUTEROL SULFATE (5 MG/ML) 0.5% IN NEBU: 2.5000 mg | INHALATION_SOLUTION | RESPIRATORY_TRACT | Status: DC | PRN

## 2012-08-31 MED ORDER — ONDANSETRON 4 MG PO TBDP
4.0000 mg | ORAL_TABLET | Freq: Once | ORAL | Status: AC
  Administered 2012-08-31: 4 mg via ORAL
  Filled 2012-08-31: qty 1

## 2012-08-31 MED ORDER — INSULIN ASPART 100 UNIT/ML ~~LOC~~ SOLN
0.0000 [IU] | Freq: Every day | SUBCUTANEOUS | Status: DC
Start: 2012-08-31 — End: 2012-09-01
  Administered 2012-08-31: 2 [IU] via SUBCUTANEOUS

## 2012-08-31 MED ORDER — ACETAMINOPHEN 325 MG PO TABS
650.0000 mg | ORAL_TABLET | Freq: Four times a day (QID) | ORAL | Status: DC | PRN
  Administered 2012-08-31: 650 mg via ORAL
  Filled 2012-08-31: qty 2

## 2012-08-31 MED ORDER — SERTRALINE HCL 50 MG PO TABS
50.0000 mg | ORAL_TABLET | Freq: Every day | ORAL | Status: DC
  Administered 2012-09-01: 50 mg via ORAL
  Filled 2012-08-31: qty 1

## 2012-08-31 MED ORDER — LOSARTAN POTASSIUM 25 MG PO TABS
25.0000 mg | ORAL_TABLET | Freq: Every day | ORAL | Status: DC
  Administered 2012-09-01: 25 mg via ORAL
  Filled 2012-08-31: qty 1

## 2012-08-31 MED ORDER — DEXTROSE 5 % IV SOLN
1.0000 g | INTRAVENOUS | Status: DC
  Administered 2012-08-31: 1 g via INTRAVENOUS
  Filled 2012-08-31 (×2): qty 10

## 2012-08-31 MED ORDER — HYDROCHLOROTHIAZIDE 25 MG PO TABS
25.0000 mg | ORAL_TABLET | Freq: Every day | ORAL | Status: DC
  Administered 2012-09-01: 25 mg via ORAL
  Filled 2012-08-31: qty 1

## 2012-08-31 MED ORDER — ONDANSETRON HCL 4 MG/2ML IJ SOLN
4.0000 mg | Freq: Four times a day (QID) | INTRAMUSCULAR | Status: DC | PRN
  Administered 2012-08-31: 4 mg via INTRAVENOUS
  Filled 2012-08-31 (×2): qty 2

## 2012-08-31 MED ORDER — ALPRAZOLAM 0.5 MG PO TABS
0.5000 mg | ORAL_TABLET | Freq: Three times a day (TID) | ORAL | Status: DC
  Administered 2012-08-31 – 2012-09-01 (×2): 0.5 mg via ORAL
  Filled 2012-08-31 (×2): qty 1

## 2012-08-31 MED ORDER — ASPIRIN EC 81 MG PO TBEC
81.0000 mg | DELAYED_RELEASE_TABLET | Freq: Every day | ORAL | Status: DC
  Administered 2012-09-01: 81 mg via ORAL
  Filled 2012-08-31: qty 1

## 2012-08-31 MED ORDER — MOXIFLOXACIN HCL IN NACL 400 MG/250ML IV SOLN
400.0000 mg | Freq: Once | INTRAVENOUS | Status: AC
Start: 2012-08-31 — End: 2012-08-31
  Administered 2012-08-31: 400 mg via INTRAVENOUS
  Filled 2012-08-31: qty 250

## 2012-08-31 MED ORDER — INSULIN ASPART 100 UNIT/ML ~~LOC~~ SOLN: 0.0000 [IU] | Freq: Three times a day (TID) | SUBCUTANEOUS | Status: DC

## 2012-08-31 MED ORDER — SODIUM CHLORIDE 0.9 % IV SOLN
INTRAVENOUS | Status: DC
  Administered 2012-08-31: 19:00:00 via INTRAVENOUS

## 2012-08-31 NOTE — ED Notes (Signed)
Patient transported to X-ray 

## 2012-08-31 NOTE — ED Provider Notes (Signed)
History     CSN: [id number]  Arrival date & time 08/31/12  1325   First MD Initiated Contact with Patient 08/31/12 1327      Chief Complaint  Patient presents with  . Nausea    (Consider location/radiation/quality/duration/timing/severity/associated sxs/prior treatment) HPI Comments: Paula [name] 78 y.o. female   The chief complaint is: Patient presents with:   Nausea    78 year old female presents to the emergency department with chief complaint of nausea. She is well known to the emergency department has been evaluated several times. History is difficult to obtain. Patient has multiple complaints. She states that today. She woke up and she was feeling fine, then she started to feel very nauseated. She was unable to complete her ADLs today and asked her husband to bring her to the hospital for evaluation. She denies vomiting. Denies fevers, chills, myalgias, arthralgias. Denies DOE, SOB, chest tightness or pressure, radiation to left arm, jaw or back, or diaphoresis. Denies dysuria, flank pain, suprapubic pain, frequency, urgency, or hematuria. Denies headaches, light headedness, weakness, visual disturbances. Denies abdominal pain,  vomiting, diarrhea or constipation. Her husband demands that she be admitted until they figure, out what's going. I spoke with the husband and assured him that we will work her up as best we can while she is here. She has very complicated past medical history, which includes lacunar infarcts. She is status post AAA repair. She has DM,  Dysphagia confirmed on swallow study, parathyroid mass, and hiatal hernia.  Patient states " I am just tired of feeling so sick." She has PCP. Followed by neruology and gi.    The history is provided by the patient, medical records and the spouse. No language interpreter was used.    Past Medical History  Diagnosis Date  . Diabetes mellitus   . COPD (chronic obstructive pulmonary disease)   . Shortness of breath   .  Depression   . Recurrent upper respiratory infection (URI)   . Seizures   . Ejection fraction     EF 40%, echo, November, 2012  / in improved, EF 60%, echo, December, 2012  . Abnormal EKG     November, 2012  . Contrast media allergy     Patient feels poorly with contrast  . Status post AAA (abdominal aortic aneurysm) repair     Surgical repair, Dr. Hart Rochester, December 27, 2011  . Pneumonia     10/2011  . Pre-syncope     vasovagal w/ heart block  . Parotid mass     has refused further eval 10/2011  . Chest pain at rest     Myoview 10/14/2011: ef 60%, ? dist anteroseptal scar w/ sublte peri-infarct ischemia vs. attenuation  . Hyperthyroidism   . CHF (congestive heart failure)   . GERD (gastroesophageal reflux disease)   . Headache   . Anxiety   . Preop cardiovascular exam     Abdominal aortic aneurysm repair by Dr.Lawson  . PONV (postoperative nausea and vomiting)   . Facial tic     L sided  . Macular degeneration     Past Surgical History  Procedure Date  . Cholecystectomy   . Knee cartilage surgery     left  . Sinus surgery with instatrak   . Appendectomy   . Oophorectomy     ovarian cyst  . Cataract extraction     bilateral  . Cardiac catheterization 11/04/11  . Bladder repair   . US echocardiography 10/14/11  . Abdominal aortic aneurysm  repair 12/27/2011    Procedure: ANEURYSM ABDOMINAL AORTIC REPAIR;  Surgeon: Josephina Gip, MD;  Location: [facility] OR;  Service: Vascular;  Laterality: N/A;  Resection and Grafting Abdominal Aortic Aneurysm , Aorta Bi Iliac.    Family History  Problem Relation Age of Onset  . Esophageal cancer Father   . Breast cancer Sister   . Colon cancer Sister   . Heart disease Sister   . Heart disease Mother     History  Substance Use Topics  . Smoking status: Former Smoker -- 1.0 packs/day for 65 years    Types: Cigarettes    Quit date: 08/31/2011  . Smokeless tobacco: Never Used  . Alcohol Use: No    OB History    Grav Para Term  Preterm Abortions TAB SAB Ect Mult Living                  Review of Systems  Constitutional: Negative for fever, chills, fatigue and unexpected weight change.  HENT: Positive for voice change (hoarseness). Negative for facial swelling, trouble swallowing, neck pain and sinus pressure.   Eyes: Negative for visual disturbance.  Respiratory: Negative for chest tightness, shortness of breath and wheezing.   Cardiovascular: Negative for chest pain and palpitations.  Gastrointestinal: Positive for nausea. Negative for vomiting, abdominal pain, diarrhea, constipation, blood in stool and abdominal distention.  Genitourinary: Negative for dysuria, hematuria, flank pain, vaginal bleeding, vaginal discharge, vaginal pain and pelvic pain.  Musculoskeletal: Negative for myalgias, back pain, joint swelling and arthralgias.  Skin: Negative for color change and rash.  Neurological: Negative for dizziness, syncope, weakness, light-headedness and headaches.  All other systems reviewed and are negative.    Allergies  Contrast media; Iohexol; Penicillins; Sulfa antibiotics; and Baclofen  Home Medications   Current Outpatient Rx  Name Route Sig Dispense Refill  . ALPRAZOLAM 0.5 MG PO TABS Oral Take 0.5 mg by mouth 3 (three) times daily. For anxiety    . ASPIRIN EC 81 MG PO TBEC Oral Take 81 mg by mouth daily.    Marland Kitchen CALCIUM-VITAMIN D PO Oral Take 1 tablet by mouth every other day.     Marland Kitchen CEFUROXIME AXETIL 500 MG PO TABS Oral Take 500 mg by mouth 2 (two) times daily. Take for 10 days. First dose on 08/27/12    . HYDROCHLOROTHIAZIDE 25 MG PO TABS Oral Take 25 mg by mouth daily.    Marland Kitchen LEVOTHYROXINE SODIUM 175 MCG PO TABS Oral Take 175 mcg by mouth daily.    Marland Kitchen LOSARTAN POTASSIUM 25 MG PO TABS Oral Take 25 mg by mouth daily.    Marland Kitchen ONDANSETRON HCL 8 MG PO TABS Oral Take by mouth 3 (three) times daily as needed. For nausea    . PANTOPRAZOLE SODIUM 40 MG PO TBEC Oral Take 1 tablet (40 mg total) by mouth daily. 30  tablet 3  . SERTRALINE HCL 50 MG PO TABS Oral Take 50 mg by mouth daily.      BP 149/78  Pulse 78  Temp 98.4 F (36.9 C) (Oral)  Resp 19  SpO2 94%  Physical Exam  Constitutional: She is oriented to person, place, and time. She appears well-developed and well-nourished.       Appearance of long time smoker.  HENT:  Head: Normocephalic and atraumatic.  Eyes: Conjunctivae normal are normal.  Neck: Normal range of motion. No JVD present. No thyromegaly present.  Cardiovascular: Normal rate, regular rhythm and normal heart sounds.  Exam reveals no gallop and no friction  rub.   No murmur heard. Abdominal: She exhibits no distension and no mass. There is no tenderness. There is no guarding.       Large well-healed, abdominal scar.  Musculoskeletal: Normal range of motion.  Lymphadenopathy:    She has no cervical adenopathy.    She has no axillary adenopathy.       Right: No inguinal, no supraclavicular and no epitrochlear adenopathy present.       Left: No inguinal, no supraclavicular and no epitrochlear adenopathy present.  Neurological: She is alert and oriented to person, place, and time.       Left sided facial weakness. Longstanding deficit.  Skin: Skin is warm and dry.    ED Course  Procedures (including critical care time)   Labs Reviewed  CBC WITH DIFFERENTIAL  COMPREHENSIVE METABOLIC PANEL  TROPONIN I   No results found.   1. Nausea alone       MDM  Cardiac work up and chest xray . With new onset hoarse voice I would like to r/o mass compressing laryngeal nerve.  No new neuro deficits.  I have given repot to Dr. Aubery Lapping who has agreed to assume car of the patient.        Arthor Captain, PA-C 08/31/12 1607

## 2012-08-31 NOTE — ED Notes (Signed)
Pt has felt sick for a year, and has felt nauseated today without vomiting

## 2012-08-31 NOTE — ED Provider Notes (Signed)
Medical screening examination/treatment/procedure(s) were conducted as a shared visit with non-physician practitioner(s) and myself.  I personally evaluated the patient during the encounter  I saw and evaluated the patient, reviewed the resident's note and I agree with the findings and plan.   Charles B. Bernette Mayers, MD 08/31/12 2314

## 2012-08-31 NOTE — H&P (Signed)
[facility] History and Physical  Paula [name] WUJ:[id number] DOB: September 26, 1927 DOA: 08/31/2012  Referring physician: er PCP: Georgianne Fick, MD    Chief Complaint: cough, SOB  HPI: Paula [name] is a 78 y.o. female  C/o cough and just not feeling well.  She has multiple complaints. She has constant nausea but no vomiting she has constant SOB.  +cough, history is difficult to obtain as patient wants every symptom that she has had for the last few years addressed. Denies fever +hoarse voice No difficulty swallowing over baseline In the ER patient was found to have a new PNA- she was recently seen at an urgent care and given a cephalosporin.     ER tried to give avelox and patient had rash and itching develope   Review of Systems: all systems reviewed negative unless stated above, patient has multiple chronic issues   Past Medical History  Diagnosis Date  . Diabetes mellitus   . COPD (chronic obstructive pulmonary disease)   . Shortness of breath   . Depression   . Recurrent upper respiratory infection (URI)   . Seizures   . Ejection fraction     EF 40%, echo, November, 2012  / in improved, EF 60%, echo, December, 2012  . Abnormal EKG     November, 2012  . Contrast media allergy     Patient feels poorly with contrast  . Status post AAA (abdominal aortic aneurysm) repair     Surgical repair, Dr. Hart Rochester, December 27, 2011  . Pneumonia     10/2011  . Pre-syncope     vasovagal w/ heart block  . Parotid mass     has refused further eval 10/2011  . Chest pain at rest     Myoview 10/14/2011: ef 60%, ? dist anteroseptal scar w/ sublte peri-infarct ischemia vs. attenuation  . Hyperthyroidism   . CHF (congestive heart failure)   . GERD (gastroesophageal reflux disease)   . Headache   . Anxiety   . Preop cardiovascular exam     Abdominal aortic aneurysm repair by Dr.Lawson  . PONV (postoperative nausea and vomiting)   . Facial tic     L sided  . Macular degeneration      Past Surgical History  Procedure Date  . Cholecystectomy   . Knee cartilage surgery     left  . Sinus surgery with instatrak   . Appendectomy   . Oophorectomy     ovarian cyst  . Cataract extraction     bilateral  . Cardiac catheterization 11/04/11  . Bladder repair   . US echocardiography 10/14/11  . Abdominal aortic aneurysm repair 12/27/2011    Procedure: ANEURYSM ABDOMINAL AORTIC REPAIR;  Surgeon: Josephina Gip, MD;  Location: [facility] OR;  Service: Vascular;  Laterality: N/A;  Resection and Grafting Abdominal Aortic Aneurysm , Aorta Bi Iliac.   Social History:  reports that she quit smoking about a year ago. Her smoking use included Cigarettes. She has a 65 pack-year smoking history. She has never used smokeless tobacco. She reports that she does not drink alcohol or use illicit drugs. Lives at home with husband  Allergies  Allergen Reactions  . Contrast Media (Iodinated Diagnostic Agents) Anaphylaxis  . Iohexol Shortness Of Breath and Swelling     Desc: PT STATES SHE HAD A SEVERE REACTION TO IV CONRAST WITH THROAT SWELLING AND SOB. SHE WAS ADMITTED TO THE HOSPITAL. SHE HAS NEVER HAD CONTRAST AGAIN.   Marland Kitchen Penicillins Rash  . Sulfa Antibiotics Anaphylaxis  .  Baclofen     Family History  Problem Relation Age of Onset  . Esophageal cancer Father   . Breast cancer Sister   . Colon cancer Sister   . Heart disease Sister   . Heart disease Mother     Prior to Admission medications   Medication Sig Start Date End Date Taking? Authorizing Provider  ALPRAZolam Prudy Feeler) 0.5 MG tablet Take 0.5 mg by mouth 3 (three) times daily. For anxiety   Yes Historical Provider, MD  aspirin EC 81 MG tablet Take 81 mg by mouth daily.   Yes Historical Provider, MD  CALCIUM-VITAMIN D PO Take 1 tablet by mouth every other day.    Yes Historical Provider, MD  cefUROXime (CEFTIN) 500 MG tablet Take 500 mg by mouth 2 (two) times daily. Take for 10 days. First dose on 08/27/12   Yes Historical Provider, MD   hydrochlorothiazide (HYDRODIURIL) 25 MG tablet Take 25 mg by mouth daily.   Yes Historical Provider, MD  levothyroxine (SYNTHROID, LEVOTHROID) 175 MCG tablet Take 175 mcg by mouth daily.   Yes Historical Provider, MD  losartan (COZAAR) 25 MG tablet Take 25 mg by mouth daily.   Yes Historical Provider, MD  ondansetron (ZOFRAN) 8 MG tablet Take by mouth 3 (three) times daily as needed. For nausea   Yes Historical Provider, MD  pantoprazole (PROTONIX) 40 MG tablet Take 1 tablet (40 mg total) by mouth daily. 08/05/12  Yes Beverley Fiedler, MD  sertraline (ZOLOFT) 50 MG tablet Take 50 mg by mouth daily.   Yes Historical Provider, MD   Physical Exam: Filed Vitals:   08/31/12 1415 08/31/12 1430 08/31/12 1500 08/31/12 1515  BP: 141/81 145/91 150/79 149/78  Pulse: 75 80 73 78  Temp:      TempSrc:      Resp: 22 21 23 19   SpO2: 93% 95% 95% 94%     General:  A+Ox3, NAD  Eyes: wnl  ENT: wnl  Neck: supple  Cardiovascular: rrr, + murmur  Respiratory: decreased BS, no wheezing  Abdomen: +BS, firm  Skin: few areas of bruising  Musculoskeletal: moves all 4 extremitites  Psychiatric: no SI/no HI  Neurologic: facial droop that patient says is from her botox injections  Labs on Admission:  Basic Metabolic Panel:  Lab 08/31/12 1610  NA 140  K 4.0  CL 105  CO2 29  GLUCOSE 85  BUN 15  CREATININE 0.79  CALCIUM 9.4  MG --  PHOS --   Liver Function Tests:  Lab 08/31/12 1530  AST 16  ALT 12  ALKPHOS 63  BILITOT 0.3  PROT 6.3  ALBUMIN 3.5   No results found for this basename: LIPASE:5,AMYLASE:5 in the last 168 hours No results found for this basename: AMMONIA:5 in the last 168 hours CBC:  Lab 08/31/12 1530  WBC 13.2*  NEUTROABS 8.7*  HGB 11.9*  HCT 36.1  MCV 99.2  PLT 291   Cardiac Enzymes:  Lab 08/31/12 1530  CKTOTAL --  CKMB --  CKMBINDEX --  TROPONINI <0.30    BNP (last 3 results)  Basename 11/03/11 0909 10/31/11 0847 10/26/11 1103  PROBNP 266.8 306.3  418.0   CBG: No results found for this basename: GLUCAP:5 in the last 168 hours  Radiological Exams on Admission: Dg Chest 2 View  08/31/2012  *RADIOLOGY REPORT*  Clinical Data: Chest pain and nausea.  CHEST - 2 VIEW  Comparison: Two-view chest 01/06/2012.  Findings: The heart is mildly enlarged.  New left lower lobe airspace  disease is present.  The left pleural effusion has increased.  The right lung bases clear.  The visualized soft tissues and bony thorax are unremarkable.  IMPRESSION:  1.  New left lower lobe pneumonia. 2.  Increase in the left pleural effusion. 3.  Stable cardiac enlargement.   Original Report Authenticated By: Jamesetta Orleans. MATTERN, M.D.     EKG: Independently reviewed. NSR  Assessment/Plan Active Problems:  Community acquired pneumonia  Leukocytosis   1. PNA- no avelox as patient has allergy,  has been on a cephalosporin for a few days, will try azithromycin and rocephin, O2 and nebs PRN 2. Obesity 3. Leukocytosis- secondary to PNA 4. HTN- home meds 5. Multiple other complaints- instructed her she needs to follow up with PCP for chronic issues- she states he is useless and she is going to find another doctor- has appointment in December     Code Status: full Family Communication: husband at bedside Disposition Plan: 1-2 days  Time spent: 50 min  Marlin Canary [facility] Pager [phone]  If 7PM-7AM, please contact night-coverage [email or web address] Password [facility] 08/31/2012, 6:07 PM

## 2012-08-31 NOTE — ED Provider Notes (Signed)
History     CSN: [id number]  Arrival date & time 08/31/12  1325   First MD Initiated Contact with Patient 08/31/12 1327      Chief Complaint  Patient presents with  . Nausea    (Consider location/radiation/quality/duration/timing/severity/associated sxs/prior treatment) HPI  Past Medical History  Diagnosis Date  . Diabetes mellitus   . COPD (chronic obstructive pulmonary disease)   . Shortness of breath   . Depression   . Recurrent upper respiratory infection (URI)   . Seizures   . Ejection fraction     EF 40%, echo, November, 2012  / in improved, EF 60%, echo, December, 2012  . Abnormal EKG     November, 2012  . Contrast media allergy     Patient feels poorly with contrast  . Status post AAA (abdominal aortic aneurysm) repair     Surgical repair, Dr. Hart Rochester, December 27, 2011  . Pneumonia     10/2011  . Pre-syncope     vasovagal w/ heart block  . Parotid mass     has refused further eval 10/2011  . Chest pain at rest     Myoview 10/14/2011: ef 60%, ? dist anteroseptal scar w/ sublte peri-infarct ischemia vs. attenuation  . Hyperthyroidism   . CHF (congestive heart failure)   . GERD (gastroesophageal reflux disease)   . Headache   . Anxiety   . Preop cardiovascular exam     Abdominal aortic aneurysm repair by Dr.Lawson  . PONV (postoperative nausea and vomiting)   . Facial tic     L sided  . Macular degeneration     Past Surgical History  Procedure Date  . Cholecystectomy   . Knee cartilage surgery     left  . Sinus surgery with instatrak   . Appendectomy   . Oophorectomy     ovarian cyst  . Cataract extraction     bilateral  . Cardiac catheterization 11/04/11  . Bladder repair   . US echocardiography 10/14/11  . Abdominal aortic aneurysm repair 12/27/2011    Procedure: ANEURYSM ABDOMINAL AORTIC REPAIR;  Surgeon: Josephina Gip, MD;  Location: [facility] OR;  Service: Vascular;  Laterality: N/A;  Resection and Grafting Abdominal Aortic Aneurysm , Aorta Bi  Iliac.    Family History  Problem Relation Age of Onset  . Esophageal cancer Father   . Breast cancer Sister   . Colon cancer Sister   . Heart disease Sister   . Heart disease Mother     History  Substance Use Topics  . Smoking status: Former Smoker -- 1.0 packs/day for 65 years    Types: Cigarettes    Quit date: 08/31/2011  . Smokeless tobacco: Never Used  . Alcohol Use: No    OB History    Grav Para Term Preterm Abortions TAB SAB Ect Mult Living                  Review of Systems  Allergies  Contrast media; Iohexol; Penicillins; Sulfa antibiotics; and Baclofen  Home Medications   Current Outpatient Rx  Name Route Sig Dispense Refill  . ALPRAZOLAM 0.5 MG PO TABS Oral Take 0.5 mg by mouth 3 (three) times daily. For anxiety    . ASPIRIN EC 81 MG PO TBEC Oral Take 81 mg by mouth daily.    Marland Kitchen CALCIUM-VITAMIN D PO Oral Take 1 tablet by mouth every other day.     Marland Kitchen CEFUROXIME AXETIL 500 MG PO TABS Oral Take 500 mg by mouth 2 (  two) times daily. Take for 10 days. First dose on 08/27/12    . HYDROCHLOROTHIAZIDE 25 MG PO TABS Oral Take 25 mg by mouth daily.    Marland Kitchen LEVOTHYROXINE SODIUM 175 MCG PO TABS Oral Take 175 mcg by mouth daily.    Marland Kitchen LOSARTAN POTASSIUM 25 MG PO TABS Oral Take 25 mg by mouth daily.    Marland Kitchen ONDANSETRON HCL 8 MG PO TABS Oral Take by mouth 3 (three) times daily as needed. For nausea    . PANTOPRAZOLE SODIUM 40 MG PO TBEC Oral Take 1 tablet (40 mg total) by mouth daily. 30 tablet 3  . SERTRALINE HCL 50 MG PO TABS Oral Take 50 mg by mouth daily.      BP 149/78  Pulse 78  Temp 98.4 F (36.9 C) (Oral)  Resp 19  SpO2 94%  Physical Exam  ED Course  Procedures (including critical care time)  Labs Reviewed  CBC WITH DIFFERENTIAL - Abnormal; Notable for the following:    WBC 13.2 (*)     RBC 3.64 (*)     Hemoglobin 11.9 (*)     Neutro Abs 8.7 (*)     Monocytes Absolute 1.1 (*)     All other components within normal limits  COMPREHENSIVE METABOLIC PANEL -  Abnormal; Notable for the following:    GFR calc non Af Amer 75 (*)     GFR calc Af Amer 87 (*)     All other components within normal limits  TROPONIN I  URINALYSIS, ROUTINE W REFLEX MICROSCOPIC   Dg Chest 2 View  08/31/2012  *RADIOLOGY REPORT*  Clinical Data: Chest pain and nausea.  CHEST - 2 VIEW  Comparison: Two-view chest 01/06/2012.  Findings: The heart is mildly enlarged.  New left lower lobe airspace disease is present.  The left pleural effusion has increased.  The right lung bases clear.  The visualized soft tissues and bony thorax are unremarkable.  IMPRESSION:  1.  New left lower lobe pneumonia. 2.  Increase in the left pleural effusion. 3.  Stable cardiac enlargement.   Original Report Authenticated By: Jamesetta Orleans. MATTERN, M.D.      1. Community acquired pneumonia   2. Nausea alone   3. Pharyngeal dysphagia       MDM  Pt care assumed from off-going team. Awaiting labs and CXR.  5:28 PM Pt with new pneumonia, likely going on the last several days. Will treat as CAP with avelox as the patient has been on a cephalopsorin for two days.   6:47 PM Nurse altered team that patient allergic to avelox-broke out in a rash. Will stop and treat with benadryl. Will allow inpatient team to decide on new antibiotics.       Daleen Bo, MD 08/31/12 [phone]

## 2012-08-31 NOTE — ED Notes (Signed)
Avelox stopped, pt complained of itching after starting antibiotic, pt began to develop a rash on her body from antibiotic.

## 2012-09-01 ENCOUNTER — Other Ambulatory Visit: Payer: Self-pay

## 2012-09-01 DIAGNOSIS — R109 Unspecified abdominal pain: Secondary | ICD-10-CM

## 2012-09-01 DIAGNOSIS — J189 Pneumonia, unspecified organism: Secondary | ICD-10-CM

## 2012-09-01 DIAGNOSIS — I714 Abdominal aortic aneurysm, without rupture: Secondary | ICD-10-CM

## 2012-09-01 LAB — BASIC METABOLIC PANEL
BUN: 13 mg/dL (ref 6–23)
Chloride: 103 mEq/L (ref 96–112)
GFR calc Af Amer: 68 mL/min — ABNORMAL LOW (ref >90)
GFR calc non Af Amer: 58 mL/min — ABNORMAL LOW (ref >90)
Potassium: 4.2 mEq/L (ref 3.5–5.1)
Sodium: 139 mEq/L (ref 135–145)

## 2012-09-01 LAB — CBC
HCT: 35.9 % — ABNORMAL LOW (ref 36.0–46.0)
Hemoglobin: 11.7 g/dL — ABNORMAL LOW (ref 12.0–15.0)
MCHC: 32.6 g/dL (ref 30.0–36.0)
RBC: 3.59 MIL/uL — ABNORMAL LOW (ref 3.87–5.11)
WBC: 10.6 10*3/uL — ABNORMAL HIGH (ref 4.0–10.5)

## 2012-09-01 LAB — EXPECTORATED SPUTUM ASSESSMENT W GRAM STAIN, RFLX TO RESP C

## 2012-09-01 LAB — LEGIONELLA ANTIGEN, URINE: Legionella Antigen, Urine: NEGATIVE

## 2012-09-01 LAB — GLUCOSE, CAPILLARY: Glucose-Capillary: 120 mg/dL — ABNORMAL HIGH (ref 70–99)

## 2012-09-01 LAB — HIV ANTIBODY (ROUTINE TESTING W REFLEX): HIV: NONREACTIVE

## 2012-09-01 MED ORDER — METHYLPREDNISOLONE 4 MG PO KIT: PACK | ORAL | Status: DC

## 2012-09-01 MED ORDER — CEFUROXIME AXETIL 500 MG PO TABS: 500.0000 mg | ORAL_TABLET | Freq: Two times a day (BID) | ORAL | Status: DC

## 2012-09-01 MED ORDER — DEXTROSE 5 % IV SOLN
1.0000 g | INTRAVENOUS | Status: DC
  Administered 2012-09-01: 1 g via INTRAVENOUS
  Filled 2012-09-01 (×3): qty 10

## 2012-09-01 MED ORDER — AZITHROMYCIN 250 MG PO TABS: 250.0000 mg | ORAL_TABLET | Freq: Every day | ORAL | Status: DC

## 2012-09-01 MED ORDER — ALBUTEROL SULFATE HFA 108 (90 BASE) MCG/ACT IN AERS: 2.0000 | INHALATION_SPRAY | Freq: Four times a day (QID) | RESPIRATORY_TRACT | Status: DC | PRN

## 2012-09-01 NOTE — ED Provider Notes (Signed)
Medical screening examination/treatment/procedure(s) were performed by non-physician practitioner and as supervising physician I was immediately available for consultation/collaboration.   Charles B. Bernette Mayers, MD 09/01/12 2244

## 2012-09-01 NOTE — Progress Notes (Signed)
09/01/12 1400  OT G-codes **NOT FOR INPATIENT CLASS**  Functional Assessment Tool Used clinical judgement  Functional Limitation Self care  Self Care Current Status [phone]) CI  Self Care Goal Status (U0454) CI  Self Care Discharge Status [phone]) CI     Harrel Carina [place]   OTR/L Pager: [phone] Office: [phone] .

## 2012-09-01 NOTE — Discharge Summary (Addendum)
[facility]                                                                                   Paula [name], is a 78 y.o. female  DOB 1926-12-18  MRN [id number].  Admission date:  08/31/2012  Discharge Date:  09/01/2012  Primary MD  Georgianne Fick, MD  Admitting Physician  Joseph Art, DO  Admission Diagnosis  Nausea alone [787.02] Pharyngeal dysphagia [787.23] Community acquired pneumonia [486] n/vomiting  Discharge Diagnosis     Active Problems:  Community acquired pneumonia  Leukocytosis   Past Medical History  Diagnosis Date  . Diabetes mellitus   . COPD (chronic obstructive pulmonary disease)   . Shortness of breath   . Depression   . Recurrent upper respiratory infection (URI)   . Seizures   . Ejection fraction     EF 40%, echo, November, 2012  / in improved, EF 60%, echo, December, 2012  . Abnormal EKG     November, 2012  . Contrast media allergy     Patient feels poorly with contrast  . Status post AAA (abdominal aortic aneurysm) repair     Surgical repair, Dr. Hart [place], December 27, 2011  . Pneumonia     10/2011  . Pre-syncope     vasovagal w/ heart block  . Parotid mass     has refused further eval 10/2011  . Chest pain at rest     Myoview 10/14/2011: ef 60%, ? dist anteroseptal scar w/ sublte peri-infarct ischemia vs. attenuation  . Hyperthyroidism   . CHF (congestive heart failure)   . GERD (gastroesophageal reflux disease)   . Headache   . Anxiety   . Preop cardiovascular exam     Abdominal aortic aneurysm repair by Dr.Lawson  . PONV (postoperative nausea and vomiting)   . Facial tic     L sided  . Macular degeneration     Past Surgical History  Procedure Date  . Cholecystectomy   . Knee cartilage surgery     left  . Sinus surgery with instatrak   . Appendectomy   . Oophorectomy     ovarian cyst  . Cataract extraction     bilateral  . Cardiac catheterization 11/04/11  . Bladder repair   . US  echocardiography 10/14/11  . Abdominal aortic aneurysm repair 12/27/2011    Procedure: ANEURYSM ABDOMINAL AORTIC REPAIR;  Surgeon: Josephina Gip, MD;  Location: [facility] OR;  Service: Vascular;  Laterality: N/A;  Resection and Grafting Abdominal Aortic Aneurysm , Aorta Bi Iliac.     Recommendations for primary care physician for things to follow:   Follow culture results, repeat 2 view chest x-ray in a week.   Discharge Diagnoses:   Active Problems:  Community acquired pneumonia  Leukocytosis    Discharge Condition: Stable   Diet recommendation: See Discharge Instructions below   Consults None   History of present illness and  Hospital Course:  See H&P, Labs, Consult and Test reports for all details in brief, patient was admitted for left lower lobe cavity acquired pneumonia with leukocytosis and chest discomfort, currently ordered resolved, patient ambulating in the hallway on room air without any  shortness of breath her oxygen need, afebrile with almost resolved leukocytosis after she received IV Rocephin and azithromycin, she feels close to her baseline and will be discharged home on oral Ceftin and azithromycin for 5 more days. Will request primary care physician to kindly repeat 2 view chest x-ray in a week, please follow blood culture results which are still pending final results should be back in 5 days, repeat  CBC & BMP in 5 days.    For her chronic nausea and vomiting which has been ongoing for her ear she has been recommended to follow with gastroenterology as outpatient. No emesis this admission.    Today   Subjective:   Paula [name] today has no headache,no chest abdominal pain,no new weakness tingling or numbness, feels much better wants to go home today.    Objective:   Blood pressure 133/66, pulse 72, temperature 98.3 F (36.8 C), temperature source Oral, resp. rate 16, height 5\' 5"  (1.651 m), weight 75.07 kg (165 lb 8 oz), SpO2 97.00%.   Intake/Output Summary  (Last 24 hours) at 09/01/12 1218 Last data filed at 09/01/12 0900  Gross per 24 hour  Intake    711 ml  Output    150 ml  Net    561 ml    Exam Awake Alert, Oriented *3, No new F.N deficits, Normal affect [place].AT,PERRAL Supple Neck,No JVD, No cervical lymphadenopathy appriciated.  Symmetrical Chest wall movement, Good air movement bilaterally, CTAB RRR,No Gallops,Rubs or new Murmurs, No Parasternal Heave +ve B.Sounds, Abd Soft, Non tender, No organomegaly appriciated, No rebound -guarding or rigidity. No Cyanosis, Clubbing or edema, No new Rash or bruise  Data Review   Major procedures and Radiology Reports - PLEASE review detailed and final reports for all details in brief -      Dg Chest 2 View  08/31/2012  *RADIOLOGY REPORT*  Clinical Data: Chest pain and nausea.  CHEST - 2 VIEW  Comparison: Two-view chest 01/06/2012.  Findings: The heart is mildly enlarged.  New left lower lobe airspace disease is present.  The left pleural effusion has increased.  The right lung bases clear.  The visualized soft tissues and bony thorax are unremarkable.  IMPRESSION:  1.  New left lower lobe pneumonia. 2.  Increase in the left pleural effusion. 3.  Stable cardiac enlargement.   Original Report Authenticated By: Jamesetta Orleans. MATTERN, M.D.    Dg Esophagus  08/10/2012  *RADIOLOGY REPORT*  Clinical Data: Dysphagia  ESOPHAGUS/BARIUM SWALLOW/TABLET STUDY  Technique: Double contrast esophagram using thin and thick barium  Fluoroscopy time was 1.4 minutes.  Comparison:  None.  Findings: The esophagus shows normal contour and distensibility.  A small hiatal hernia is noted.  No upper esophageal web .  No esophageal stricture.  Esophageal dysmotility with secondary and tertiary contractions noted.  Abundant gastroesophageal reflux to the level of the upper esophagus.  The visualized proximal stomach is unremarkable.  Barium tablet was swallowed and progressed through esophagus into the stomach without  difficulty.  IMPRESSION:  1.  No upper esophageal web .  No esophageal stricture. 2.  Esophageal dysmotility with secondary and tertiary contractions. 3.  Abundant gastroesophageal reflux. 4.  Small hiatal hernia.   Original Report Authenticated By: Natasha Mead, M.D.     Micro Results     CBC w Diff: Lab Results  Component Value Date   WBC 10.6* 09/01/2012   HGB 11.7* 09/01/2012   HCT 35.9* 09/01/2012   PLT 295 09/01/2012   LYMPHOPCT 21 08/31/2012  MONOPCT 8 08/31/2012   EOSPCT 5 08/31/2012   BASOPCT 1 08/31/2012    CMP: Lab Results  Component Value Date   NA 139 09/01/2012   K 4.2 09/01/2012   CL 103 09/01/2012   CO2 27 09/01/2012   BUN 13 09/01/2012   CREATININE 0.89 09/01/2012   CREATININE 0.80 12/23/2011   PROT 6.3 08/31/2012   ALBUMIN 3.5 08/31/2012   BILITOT 0.3 08/31/2012   ALKPHOS 63 08/31/2012   AST 16 08/31/2012   ALT 12 08/31/2012  .   Discharge Instructions     Follow with Primary MD Georgianne Fick, MD in 3 days   Get CBC, CMP, checked 3 days by Primary MD and again as instructed by your Primary MD. Get a 2 view Chest X ray done next.  Get Medicines reviewed and adjusted.  Please request your Prim.MD to go over all Hospital Tests and Procedure/Radiological results at the follow up, please get all Hospital records sent to your Prim MD by signing hospital release before you go home.  Activity: As tolerated with Full fall precautions use walker/cane & assistance as needed   Diet:  Heart Healthy  Check your Weight same time everyday, if you gain over 2 pounds, or you develop in leg swelling, experience more shortness of breath or chest pain, call your Primary MD immediately. Follow Cardiac Low Salt Diet and 1.8 lit/day fluid restriction.  Disposition Home   If you experience worsening of your admission symptoms, develop shortness of breath, life threatening emergency, suicidal or homicidal thoughts you must seek medical attention immediately by  calling 911 or calling your MD immediately  if symptoms less severe.  You Must read complete instructions/literature along with all the possible adverse reactions/side effects for all the Medicines you take and that have been prescribed to you. Take any new Medicines after you have completely understood and accpet all the possible adverse reactions/side effects.   Do not drive and provide baby sitting services if your were admitted for syncope or siezures until you have seen by Primary MD or a Neurologist and advised to do so again.  Do not drive when taking Pain medications.    Do not take more than prescribed Pain, Sleep and Anxiety Medications  Special Instructions: If you have smoked or chewed Tobacco  in the last 2 yrs please stop smoking, stop any regular Alcohol  and or any Recreational drug use.  Wear Seat belts while driving.   Pneumonia, Adult Pneumonia is an infection of the lungs.  CAUSES Pneumonia may be caused by bacteria or a virus. Usually, these infections are caused by breathing infectious particles into the lungs (respiratory tract). SYMPTOMS   Cough.  Fever.  Chest pain.  Increased rate of breathing.  Wheezing.  Mucus production. DIAGNOSIS  If you have the common symptoms of pneumonia, your caregiver will typically confirm the diagnosis with a chest X-ray. The X-ray will show an abnormality in the lung (pulmonary infiltrate) if you have pneumonia. Other tests of your blood, urine, or sputum may be done to find the specific cause of your pneumonia. Your caregiver may also do tests (blood gases or pulse oximetry) to see how well your lungs are working. TREATMENT  Some forms of pneumonia may be spread to other people when you cough or sneeze. You may be asked to wear a mask before and during your exam. Pneumonia that is caused by bacteria is treated with antibiotic medicine. Pneumonia that is caused by the influenza virus may be treated  with an antiviral medicine.  Most other viral infections must run their course. These infections will not respond to antibiotics.  PREVENTION A pneumococcal shot (vaccine) is available to prevent a common bacterial cause of pneumonia. This is usually suggested for:  People over 41 years old.  Patients on chemotherapy.  People with chronic lung problems, such as bronchitis or emphysema.  People with immune system problems. If you are over 65 or have a high risk condition, you may receive the pneumococcal vaccine if you have not received it before. In some countries, a routine influenza vaccine is also recommended. This vaccine can help prevent some cases of pneumonia.You may be offered the influenza vaccine as part of your care. If you smoke, it is time to quit. You may receive instructions on how to stop smoking. Your caregiver can provide medicines and counseling to help you quit. HOME CARE INSTRUCTIONS   Cough suppressants may be used if you are losing too much rest. However, coughing protects you by clearing your lungs. You should avoid using cough suppressants if you can.  Your caregiver may have prescribed medicine if he or she thinks your pneumonia is caused by a bacteria or influenza. Finish your medicine even if you start to feel better.  Your caregiver may also prescribe an expectorant. This loosens the mucus to be coughed up.  Only take over-the-counter or prescription medicines for pain, discomfort, or fever as directed by your caregiver.  Do not smoke. Smoking is a common cause of bronchitis and can contribute to pneumonia. If you are a smoker and continue to smoke, your cough may last several weeks after your pneumonia has cleared.  A cold steam vaporizer or humidifier in your room or home may help loosen mucus.  Coughing is often worse at night. Sleeping in a semi-upright position in a recliner or using a couple pillows under your head will help with this.  Get rest as you feel it is needed. Your body  will usually let you know when you need to rest. SEEK IMMEDIATE MEDICAL CARE IF:   Your illness becomes worse. This is especially true if you are elderly or weakened from any other disease.  You cannot control your cough with suppressants and are losing sleep.  You begin coughing up blood.  You develop pain which is getting worse or is uncontrolled with medicines.  You have a fever.  Any of the symptoms which initially brought you in for treatment are getting worse rather than better.  You develop shortness of breath or chest pain. MAKE SURE YOU:   Understand these instructions.  Will watch your condition.  Will get help right away if you are not doing well or get worse. Document Released: 10/28/2005 Document Revised: 01/20/2012 Document Reviewed: 01/17/2011 [facility] Patient Information 2013 [place], [place].   Follow-up Information    Schedule an appointment as soon as possible for a visit with PYRTLE, Carie Caddy, MD. (As needed)    Contact information:   [address] [place] [address] [phone]       Follow up with [facility], MD. Schedule an appointment as soon as possible for a visit in 1 week.   Contact information:   [address] [place] [address] [phone]            Discharge Medications     Medication List     As of 09/01/2012 12:18 PM    START taking these medications  albuterol 108 (90 BASE) MCG/ACT inhaler   Commonly known as: PROVENTIL HFA;VENTOLIN HFA   Inhale 2 puffs into the lungs every 6 (six) hours as needed for wheezing.      azithromycin 250 MG tablet   Commonly known as: ZITHROMAX   Take 1 tablet (250 mg total) by mouth daily.      methylPREDNISolone 4 MG tablet   Commonly known as: MEDROL DOSEPAK   follow package directions      CONTINUE taking these medications         ALPRAZolam 0.5 MG tablet   Commonly known as: XANAX      aspirin EC 81 MG tablet      CALCIUM-VITAMIN D PO       cefUROXime 500 MG tablet   Commonly known as: CEFTIN   Take 1 tablet (500 mg total) by mouth 2 (two) times daily. Take for 10 days. First dose on 08/27/12      hydrochlorothiazide 25 MG tablet   Commonly known as: HYDRODIURIL      levothyroxine 175 MCG tablet   Commonly known as: SYNTHROID, LEVOTHROID      losartan 25 MG tablet   Commonly known as: COZAAR      ondansetron 8 MG tablet   Commonly known as: ZOFRAN      pantoprazole 40 MG tablet   Commonly known as: PROTONIX   Take 1 tablet (40 mg total) by mouth daily.      sertraline 50 MG tablet   Commonly known as: ZOLOFT          Where to get your medications    These are the prescriptions that you need to pick up. We sent them to a specific pharmacy, so you will need to go there to get them.   [id number] [place], [place] - [address]    [address] [place] [address]    Phone: [phone]        azithromycin 250 MG tablet   cefUROXime 500 MG tablet   methylPREDNISolone 4 MG tablet         You may get these medications from any pharmacy.         albuterol 108 (90 BASE) MCG/ACT inhaler               Total Time in preparing paper work, data evaluation and todays exam - 35 minutes  Leroy Sea M.D on 09/01/2012 at 12:18 PM  [facility] Office  [phone]

## 2012-09-01 NOTE — Progress Notes (Signed)
Physical Therapy Evaluation Patient Details Name: Paula [name] MRN: [id number] DOB: [birth year] Today's Date: 09/01/2012 Time: 4098-1191 PT Time Calculation (min): 30 min  PT Assessment / Plan / Recommendation Clinical Impression  Pt is a 78 yo female who presents with gait instability and deconditioning due to pneumonia and pleural effusion. Pt would benefit from skilled PT in the actue setting to maxamize mobility and Independence for return home with husband. Pt does not want to have HHPT.     PT Assessment  Patient needs continued PT services    Follow Up Recommendations  No PT follow up;Other (comment) (Pt refusing HHPT)    Does the patient have the potential to tolerate intense rehabilitation      Barriers to Discharge        Equipment Recommendations  None recommended by PT    Recommendations for Other Services     Frequency Min 3X/week    Precautions / Restrictions Restrictions Weight Bearing Restrictions: No   Pertinent Vitals/Pain       Mobility  Bed Mobility Bed Mobility: Supine to Sit Supine to Sit: 4: Min assist Details for Bed Mobility Assistance: assistance with trunk for initial rise up Transfers Transfers: Sit to Stand Sit to Stand: 5: Supervision;From bed;From toilet Details for Transfer Assistance: cues for hand placement for increased safety Ambulation/Gait Ambulation/Gait Assistance: 5: Supervision;Other (comment) (min assistance without RW) Ambulation Distance (Feet): 120 Feet Assistive device: Rolling walker Ambulation/Gait Assistance Details: cues to stay inside walker for increased safety and to increase bil. step length Gait Pattern: Decreased stride length Gait velocity: decreased due to decreased attention General Gait Details: decreased balance and protective reactions without rw    Shoulder Instructions     Exercises     PT Diagnosis: Difficulty walking  PT Problem List: Decreased activity tolerance;Decreased balance;Decreased  mobility;Decreased safety awareness PT Treatment Interventions: DME instruction;Gait training;Therapeutic activities;Balance training;Therapeutic exercise   PT Goals Acute Rehab PT Goals PT Goal Formulation: With patient Time For Goal Achievement: 09/15/12 Potential to Achieve Goals: Good Pt will go Supine/Side to Sit: with modified independence PT Goal: Supine/Side to Sit - Progress: Goal set today Pt will go Sit to Stand: with modified independence PT Goal: Sit to Stand - Progress: Goal set today Pt will Transfer Bed to Chair/Chair to Bed: with modified independence PT Transfer Goal: Bed to Chair/Chair to Bed - Progress: Goal set today Pt will Ambulate: >150 feet;with least restrictive assistive device PT Goal: Ambulate - Progress: Goal set today Pt will Perform Home Exercise Program: Independently PT Goal: Perform Home Exercise Program - Progress: Goal set today  Visit Information  Last PT Received On: 09/01/12 Assistance Needed: +1    Subjective Data  Subjective: I do not walk outside. Patient Stated Goal: To go home and be able to take care of self.   Prior Functioning  Home Living Lives With: Spouse Available Help at Discharge: Family Type of Home: House Home Access: Stairs to enter Secretary/administrator of Steps: 1 Entrance Stairs-Rails: None Home Layout: One level Home Adaptive Equipment: Walker - rolling;Straight cane Prior Function Level of Independence: Independent with assistive device(s) Able to Take Stairs?: Yes Driving: No Vocation: Retired Musician: No difficulties    Cognition  Overall Cognitive Status: Appears within functional limits for tasks assessed/performed Arousal/Alertness: Awake/alert Orientation Level: Appears intact for tasks assessed Behavior During Session: [facility] for tasks performed    Extremity/Trunk Assessment Right Lower Extremity Assessment RLE ROM/Strength/Tone: Within functional levels RLE Sensation: WFL -  Light Touch Left  Lower Extremity Assessment LLE ROM/Strength/Tone: WFL for tasks assessed LLE Sensation: WFL - Light Touch Trunk Assessment Trunk Assessment: Normal   Balance Balance Balance Assessed: Yes Static Standing Balance Static Standing - Balance Support: No upper extremity supported Static Standing - Level of Assistance: 5: Stand by assistance Static Standing - Comment/# of Minutes: decreased ankle strategy and step length without use of RW  End of Session PT - End of Session Equipment Utilized During Treatment: Gait belt Activity Tolerance: Patient tolerated treatment well Patient left: in chair;with call bell/phone within reach;with family/visitor present Nurse Communication: Mobility status  GP Functional Assessment Tool Used: clinical judgement Functional Limitation: Mobility: Walking and moving around Mobility: Walking and Moving Around Current Status [phone]): At least 1 percent but less than 20 percent impaired, limited or restricted Mobility: Walking and Moving Around Goal Status [phone]): 0 percent impaired, limited or restricted   Paula [name] 09/01/2012, 9:08 AM

## 2012-09-01 NOTE — Evaluation (Signed)
Occupational Therapy Evaluation Patient Details Name: Paula [name] MRN: [id number] DOB: [birth year] Today's Date: 09/01/2012 Time: 2130-8657 OT Time Calculation (min): 17 min  OT Assessment / Plan / Recommendation Clinical Impression  Pt. 78 yo female admitted with community acquired pneumonia. Pt. completes ADL's, transfers and ambulation at supervision level. Pt's husband is available 24hrs/day for assistance. Pt. is at baseline and safe to d/c home. OT signing off.      OT Assessment  Patient does not need any further OT services    Follow Up Recommendations  Supervision - Intermittent    Barriers to Discharge      Equipment Recommendations  None recommended by OT    Recommendations for Other Services    Frequency       Precautions / Restrictions Precautions Precautions: Fall Restrictions Weight Bearing Restrictions: No   Pertinent Vitals/Pain None reported     ADL  Grooming: Performed;Wash/dry hands;Supervision/safety Where Assessed - Grooming: Unsupported standing Toilet Transfer: Research scientist (life sciences) Method: Sit to Barista: Regular height toilet;Grab bars Toileting - Clothing Manipulation and Hygiene: Performed;Independent Where Assessed - Toileting Clothing Manipulation and Hygiene: Sit to stand from 3-in-1 or toilet Equipment Used: Gait belt;Rolling walker Transfers/Ambulation Related to ADLs: Supervision for safety during transfers and ambulation, pt states she has been weak feeling from being sick  ADL Comments: Pt. already dressed upon arrival into room. Pt. and husband states he helps her to tie shoes if she needs it but she is independent to dress herself. Pt. completed toilet transfer and grooming at sink with supervision for safety. Husband states he is retired and available 24 hrs/day and that he never leaves her home alone, she goes with him everywhere. Pt. states she bathes herself with a washcloth and does  not get into the tub, husband (A) with washing her back.     OT Diagnosis:    OT Problem List:   OT Treatment Interventions:     OT Goals    Visit Information  Last OT Received On: 09/01/12 Assistance Needed: +1    Subjective Data  Subjective: They didn't believe me that I was sick Patient Stated Goal: To go home today with her husband    Prior Functioning     Home Living Lives With: Spouse Available Help at Discharge: Family Type of Home: House Home Access: Stairs to enter Secretary/administrator of Steps: 1 Entrance Stairs-Rails: None Home Layout: One level Bathroom Shower/Tub: Tub/shower unit (Doesn't use it, typically sponge bathes. ) Bathroom Toilet: Standard Home Adaptive Equipment: Walker - rolling;Straight cane Prior Function Level of Independence: Independent with assistive device(s) Able to Take Stairs?: Yes Driving: No Vocation: Retired Musician: No difficulties Dominant Hand: Right         Vision/Perception     Cognition  Overall Cognitive Status: Appears within functional limits for tasks assessed/performed Arousal/Alertness: Awake/alert Orientation Level: Appears intact for tasks assessed Behavior During Session: [facility] for tasks performed    Extremity/Trunk Assessment Trunk Assessment Trunk Assessment: Normal     Mobility Bed Mobility Bed Mobility: Not assessed Transfers Transfers: Sit to Stand;Stand to Sit Sit to Stand: 5: Supervision;From bed;From toilet;With upper extremity assist Stand to Sit: 5: Supervision;With upper extremity assist;To bed;To toilet Details for Transfer Assistance: cues for correct placement of RW when performing transfers.                     End of Session OT - End of Session Equipment Utilized During Treatment: Gait belt Activity  Tolerance: Patient tolerated treatment well Patient left: with call bell/phone within reach;in bed;with family/visitor present Nurse Communication: Mobility  status  GO     Cleora Fleet 09/01/2012, 2:31 PM

## 2012-09-01 NOTE — Evaluation (Signed)
I agree with the following treatment note after reviewing documentation.   [name], Ladarien Beeks Brynn   OTR/L Pager: [phone] Office: [phone] .   

## 2012-09-01 NOTE — Progress Notes (Signed)
Occupational Therapy Discharge Patient Details Name: Paula [name] MRN: [id number] DOB: October 20, 1927 Today's Date: 09/01/2012 Time: 1191-4782 OT Time Calculation (min): 17 min  Patient discharged from OT services secondary to Pt. at supervision level for ADL's with husband available to (A) 24hrs/day. Pt at baseline, no further acute OT needs.   Please see latest therapy progress note for current level of functioning and progress toward goals.    Progress and discharge plan discussed with patient and/or caregiver: Patient/Caregiver agrees with plan  GO     Cleora Fleet 09/01/2012, 2:32 PM

## 2012-09-01 NOTE — Progress Notes (Signed)
Patient discharged to home in care of spouse. Medications and instructions reviewed with patient and spouse with all questions answered. IV d/c'd with cath intact. Assessment unchanged from this am. Patient is to follow up with PCP for blood work and cxr in 3 days.

## 2012-09-01 NOTE — Progress Notes (Signed)
I agree with the following treatment note after reviewing documentation.   [name], [name]   OTR/L Pager: [phone] Office: [phone] .   

## 2012-09-02 ENCOUNTER — Telehealth: Payer: Self-pay | Admitting: Cardiology

## 2012-09-02 NOTE — Telephone Encounter (Signed)
Pt wants to ask about getting some lab work done

## 2012-09-02 NOTE — Telephone Encounter (Addendum)
Called patient. She states that she was released from [facility] with pneumonia yesterday and was advised to have a cbc cmet and chest xray outpatient in a few days. Afebrile, still a little weak and taking Ceftin and Zithromax. She is set up to see Dr. Vikki Ports on 12/18 as a new patient. Currently does not have a PCP. States that she called Dr.Valerie L's office and they would not set her up for labs/chest xray because she is not an established patient. Has an appointment with Dr.Katz on 11/4 and wanted to know if she could schedule tests under his orders. Advised patient that we will send him a message since he is in another office today and call her back. Will forward to Dr.Katz and his nurse Debby Lefler.

## 2012-09-02 NOTE — Telephone Encounter (Signed)
OK with me.

## 2012-09-02 NOTE — Telephone Encounter (Signed)
Paula [name] states she no longer needs the labs and cxr ordered.  She states her husband had a different physician order the labs and cxr.  She will call us if she is unable to get them done.

## 2012-09-04 ENCOUNTER — Ambulatory Visit
Admission: RE | Admit: 2012-09-04 | Discharge: 2012-09-04 | Disposition: A | Discharge status: Home / Self Care | Payer: Medicare Other | Source: Ambulatory Visit | Attending: Internal Medicine | Admitting: Internal Medicine

## 2012-09-04 ENCOUNTER — Other Ambulatory Visit: Payer: Self-pay | Admitting: Internal Medicine

## 2012-09-04 DIAGNOSIS — J189 Pneumonia, unspecified organism: Secondary | ICD-10-CM

## 2012-09-04 LAB — CULTURE, RESPIRATORY W GRAM STAIN: Culture: NORMAL

## 2012-09-07 LAB — CULTURE, BLOOD (ROUTINE X 2): Culture: NO GROWTH

## 2012-09-11 ENCOUNTER — Encounter: Payer: Self-pay | Admitting: Cardiology

## 2012-09-12 ENCOUNTER — Encounter (HOSPITAL_COMMUNITY): Payer: Self-pay | Admitting: Emergency Medicine

## 2012-09-12 ENCOUNTER — Emergency Department (HOSPITAL_COMMUNITY)
Admission: EM | Admit: 2012-09-12 | Discharge: 2012-09-12 | Disposition: A | Discharge status: Home / Self Care | Payer: Medicare Other | Attending: Emergency Medicine | Admitting: Emergency Medicine

## 2012-09-12 ENCOUNTER — Emergency Department (INDEPENDENT_AMBULATORY_CARE_PROVIDER_SITE_OTHER)
Admission: EM | Admit: 2012-09-12 | Discharge: 2012-09-12 | Disposition: A | Discharge status: Nursing Home (Includes ICF) | Payer: Medicare Other | Source: Home / Self Care | Attending: Family Medicine | Admitting: Family Medicine

## 2012-09-12 ENCOUNTER — Emergency Department (HOSPITAL_COMMUNITY): Payer: Medicare Other

## 2012-09-12 DIAGNOSIS — I509 Heart failure, unspecified: Secondary | ICD-10-CM | POA: Insufficient documentation

## 2012-09-12 DIAGNOSIS — J449 Chronic obstructive pulmonary disease, unspecified: Secondary | ICD-10-CM | POA: Insufficient documentation

## 2012-09-12 DIAGNOSIS — F3289 Other specified depressive episodes: Secondary | ICD-10-CM | POA: Insufficient documentation

## 2012-09-12 DIAGNOSIS — R569 Unspecified convulsions: Secondary | ICD-10-CM | POA: Insufficient documentation

## 2012-09-12 DIAGNOSIS — J189 Pneumonia, unspecified organism: Secondary | ICD-10-CM

## 2012-09-12 DIAGNOSIS — J4489 Other specified chronic obstructive pulmonary disease: Secondary | ICD-10-CM | POA: Insufficient documentation

## 2012-09-12 DIAGNOSIS — Z87891 Personal history of nicotine dependence: Secondary | ICD-10-CM | POA: Insufficient documentation

## 2012-09-12 DIAGNOSIS — F411 Generalized anxiety disorder: Secondary | ICD-10-CM | POA: Insufficient documentation

## 2012-09-12 DIAGNOSIS — J90 Pleural effusion, not elsewhere classified: Secondary | ICD-10-CM | POA: Insufficient documentation

## 2012-09-12 DIAGNOSIS — E0590 Thyrotoxicosis, unspecified without thyrotoxic crisis or storm: Secondary | ICD-10-CM | POA: Insufficient documentation

## 2012-09-12 DIAGNOSIS — F329 Major depressive disorder, single episode, unspecified: Secondary | ICD-10-CM | POA: Insufficient documentation

## 2012-09-12 DIAGNOSIS — Z7982 Long term (current) use of aspirin: Secondary | ICD-10-CM | POA: Insufficient documentation

## 2012-09-12 DIAGNOSIS — R0602 Shortness of breath: Secondary | ICD-10-CM

## 2012-09-12 DIAGNOSIS — E119 Type 2 diabetes mellitus without complications: Secondary | ICD-10-CM | POA: Insufficient documentation

## 2012-09-12 DIAGNOSIS — K219 Gastro-esophageal reflux disease without esophagitis: Secondary | ICD-10-CM | POA: Insufficient documentation

## 2012-09-12 DIAGNOSIS — Z79899 Other long term (current) drug therapy: Secondary | ICD-10-CM | POA: Insufficient documentation

## 2012-09-12 LAB — BASIC METABOLIC PANEL
CO2: 28 mEq/L (ref 19–32)
Calcium: 9.9 mg/dL (ref 8.4–10.5)
Creatinine, Ser: 0.78 mg/dL (ref 0.50–1.10)
Glucose, Bld: 115 mg/dL — ABNORMAL HIGH (ref 70–99)
Sodium: 138 mEq/L (ref 135–145)

## 2012-09-12 LAB — CBC WITH DIFFERENTIAL/PLATELET
Eosinophils Relative: 4 % (ref 0–5)
HCT: 36.5 % (ref 36.0–46.0)
Lymphocytes Relative: 22 % (ref 12–46)
Lymphs Abs: 3.5 10*3/uL (ref 0.7–4.0)
MCV: 97.3 fL (ref 78.0–100.0)
Monocytes Absolute: 1.6 10*3/uL — ABNORMAL HIGH (ref 0.1–1.0)
RBC: 3.75 MIL/uL — ABNORMAL LOW (ref 3.87–5.11)
WBC: 16.2 10*3/uL — ABNORMAL HIGH (ref 4.0–10.5)

## 2012-09-12 NOTE — ED Provider Notes (Signed)
History     CSN: [id number]  Arrival date & time 09/12/12  1826   First MD Initiated Contact with Patient 09/12/12 1842      Chief Complaint  Patient presents with  . Shortness of Breath     Patient is a 78 y.o. female presenting with shortness of breath. The history is provided by the patient.  Shortness of Breath  Episode onset: several days ago. The onset was gradual. The problem occurs occasionally. The problem has been gradually worsening. The problem is moderate. The symptoms are relieved by rest. Associated symptoms include shortness of breath. Pertinent negatives include no fever.  Pt reports several days of shortness of breath, fatigue and some worsening back pain Reports recent h/o pneumonia, now feels worse Sent from [facility] for further evaluation  Past Medical History  Diagnosis Date  . Diabetes mellitus   . COPD (chronic obstructive pulmonary disease)   . Shortness of breath   . Depression   . Recurrent upper respiratory infection (URI)   . Seizures   . Ejection fraction     EF 40%, echo, November, 2012  / in improved, EF 60%, echo, December, 2012  . Abnormal EKG     November, 2012  . Contrast media allergy     Patient feels poorly with contrast  . Status post AAA (abdominal aortic aneurysm) repair     Surgical repair, Dr. Hart Rochester, December 27, 2011  . Pneumonia     10/2011  . Pre-syncope     vasovagal w/ heart block  . Parotid mass     has refused further eval 10/2011  . Chest pain at rest     Myoview 10/14/2011: ef 60%, ? dist anteroseptal scar w/ sublte peri-infarct ischemia vs. attenuation  . Hyperthyroidism   . CHF (congestive heart failure)   . GERD (gastroesophageal reflux disease)   . Headache   . Anxiety   . Preop cardiovascular exam     Abdominal aortic aneurysm repair by Dr.Lawson  . PONV (postoperative nausea and vomiting)   . Facial tic     L sided  . Macular degeneration     Past Surgical History  Procedure Date  . Cholecystectomy   .  Knee cartilage surgery     left  . Sinus surgery with instatrak   . Appendectomy   . Oophorectomy     ovarian cyst  . Cataract extraction     bilateral  . Cardiac catheterization 11/04/11  . Bladder repair   . US echocardiography 10/14/11  . Abdominal aortic aneurysm repair 12/27/2011    Procedure: ANEURYSM ABDOMINAL AORTIC REPAIR;  Surgeon: Josephina Gip, MD;  Location: [facility] OR;  Service: Vascular;  Laterality: N/A;  Resection and Grafting Abdominal Aortic Aneurysm , Aorta Bi Iliac.    Family History  Problem Relation Age of Onset  . Esophageal cancer Father   . Breast cancer Sister   . Colon cancer Sister   . Heart disease Sister   . Heart disease Mother     History  Substance Use Topics  . Smoking status: Former Smoker -- 1.0 packs/day for 65 years    Types: Cigarettes    Quit date: 08/31/2011  . Smokeless tobacco: Never Used  . Alcohol Use: No    OB History    Grav Para Term Preterm Abortions TAB SAB Ect Mult Living                  Review of Systems  Constitutional: Negative for fever.  Respiratory: Positive for shortness of breath.   All other systems reviewed and are negative.    Allergies  Contrast media; Iohexol; Penicillins; Sulfa antibiotics; Avelox; and Baclofen  Home Medications   Current Outpatient Rx  Name Route Sig Dispense Refill  . ALBUTEROL SULFATE HFA 108 (90 BASE) MCG/ACT IN AERS Inhalation Inhale 2 puffs into the lungs every 6 (six) hours as needed for wheezing. 1 Inhaler 2  . ALPRAZOLAM 0.5 MG PO TABS Oral Take 0.5 mg by mouth 3 (three) times daily. For anxiety    . ASPIRIN EC 81 MG PO TBEC Oral Take 81 mg by mouth daily.    . AZITHROMYCIN 250 MG PO TABS Oral Take 250 mg by mouth daily. Patient has one pill left for tomorrow will complete course    . CALCIUM-VITAMIN D PO Oral Take 1 tablet by mouth every other day.     Marland Kitchen HYDROCHLOROTHIAZIDE 25 MG PO TABS Oral Take 25 mg by mouth daily.    Marland Kitchen LEVOTHYROXINE SODIUM 175 MCG PO TABS Oral Take 175  mcg by mouth daily.    Marland Kitchen ONDANSETRON HCL 8 MG PO TABS Oral Take by mouth 3 (three) times daily as needed. For nausea    . PANTOPRAZOLE SODIUM 40 MG PO TBEC Oral Take 1 tablet (40 mg total) by mouth daily. 30 tablet 3  . SERTRALINE HCL 50 MG PO TABS Oral Take 50 mg by mouth daily.      BP 135/60  Pulse 87  Temp 98.6 F (37 C) (Oral)  Resp 19  SpO2 99% BP 119/73  Pulse 95  Temp 98.6 F (37 C) (Oral)  Resp 25  SpO2 97%  Physical Exam CONSTITUTIONAL: Well developed/well nourished HEAD AND FACE: Normocephalic/atraumatic EYES: EOMI/PERRL ENMT: Mucous membranes moist NECK: supple no meningeal signs SPINE:entire spine nontender CV: S1/S2 noted, no murmurs/rubs/gallops noted LUNGS: mild tachypnea noted, crackles in left base ABDOMEN: soft, nontender, no rebound or guarding GU:no cva tenderness NEURO: Pt is awake/alert, moves all extremitiesx4 EXTREMITIES: pulses normal, full ROM SKIN: warm, color normal PSYCH: no abnormalities of mood noted  ED Course  Procedures   Labs Reviewed  CBC WITH DIFFERENTIAL - Abnormal; Notable for the following:    WBC 16.2 (*)     RBC 3.75 (*)     Neutro Abs 10.5 (*)     Monocytes Absolute 1.6 (*)     All other components within normal limits  BASIC METABOLIC PANEL   1:61 PM Pt here for [facility] for further eval Recent admission for pneumonia that had improved, pt now feels more symptomatic   11:04 PM Pt improved.  She admits she just "felt smothered" but no active CP/weakness.  She appears well.  She walked around without any SOB.  No hypoxia.  She is speaking in no distress.  Pleural effusion is stable for prior per rads.  She has f/u PCP at [facility] later this week.  Doubt ACS/PE at this time  MDM  Nursing notes including past medical history and social history reviewed and considered in documentation Previous records reviewed and considered xrays reviewed and considered Labs/vital reviewed and considered        Date: 09/12/2012   Rate: 96  Rhythm: normal sinus rhythm  QRS Axis: normal  Intervals: normal  ST/T Wave abnormalities: nonspecific ST changes  Conduction Disutrbances:none  Narrative Interpretation:   Old EKG Reviewed: unchanged    Joya Gaskins, MD 09/12/12 2306

## 2012-09-12 NOTE — ED Notes (Signed)
Pt ambulated around department. Pt at O2 saturations ranging from 92-97%. Pt talking throughout walk. Heartrate 110-115

## 2012-09-12 NOTE — ED Notes (Signed)
Pt states that she was recently dx with pneumonia on 09/09/12 and is currently taking amoxicillin last dose is to be taking tomorrow.  Pt is scheduled for f/u with her doctor on 11/6 but she states that she "just feel sick" .  C/o fatigue and body aches.

## 2012-09-12 NOTE — ED Notes (Signed)
Pt. Started on 1LpM O2 [place], Heart Monitor: 91bpm, spO2: 97%; Temp. 98.74F

## 2012-09-12 NOTE — ED Provider Notes (Signed)
History     CSN: [id number]  Arrival date & time 09/12/12  1504   First MD Initiated Contact with Patient 09/12/12 1541      Chief Complaint  Patient presents with  . Pneumonia    recently dx with pneumonia on oct 30.    (Consider location/radiation/quality/duration/timing/severity/associated sxs/prior treatment) HPI Comments: 78 y/o female with multiple co-morbidities including but not limited to, DM, CHF, COPD and recent AAA aneurysm repair (11/2011), recent ED visit for left lower pneumonia (10/22). Completed treatment with cefuroxime and is currently on day 4/5 of azithromycin today. Not established primary care provider. Here complaining of feeling tired and nauseous, still with left sided pleuritic pain and short of breath with exertion. Decreased appetite. Tolerating fluids no vomiting. Denies abdominal pain or chest pain. No diarrhea.   Past Medical History  Diagnosis Date  . Diabetes mellitus   . COPD (chronic obstructive pulmonary disease)   . Shortness of breath   . Depression   . Recurrent upper respiratory infection (URI)   . Seizures   . Ejection fraction     EF 40%, echo, November, 2012  / in improved, EF 60%, echo, December, 2012  . Abnormal EKG     November, 2012  . Contrast media allergy     Patient feels poorly with contrast  . Status post AAA (abdominal aortic aneurysm) repair     Surgical repair, Dr. Hart Rochester, December 27, 2011  . Pneumonia     10/2011  . Pre-syncope     vasovagal w/ heart block  . Parotid mass     has refused further eval 10/2011  . Chest pain at rest     Myoview 10/14/2011: ef 60%, ? dist anteroseptal scar w/ sublte peri-infarct ischemia vs. attenuation  . Hyperthyroidism   . CHF (congestive heart failure)   . GERD (gastroesophageal reflux disease)   . Headache   . Anxiety   . Preop cardiovascular exam     Abdominal aortic aneurysm repair by Dr.Lawson  . PONV (postoperative nausea and vomiting)   . Facial tic     L sided  .  Macular degeneration     Past Surgical History  Procedure Date  . Cholecystectomy   . Knee cartilage surgery     left  . Sinus surgery with instatrak   . Appendectomy   . Oophorectomy     ovarian cyst  . Cataract extraction     bilateral  . Cardiac catheterization 11/04/11  . Bladder repair   . US echocardiography 10/14/11  . Abdominal aortic aneurysm repair 12/27/2011    Procedure: ANEURYSM ABDOMINAL AORTIC REPAIR;  Surgeon: Josephina Gip, MD;  Location: [facility] OR;  Service: Vascular;  Laterality: N/A;  Resection and Grafting Abdominal Aortic Aneurysm , Aorta Bi Iliac.    Family History  Problem Relation Age of Onset  . Esophageal cancer Father   . Breast cancer Sister   . Colon cancer Sister   . Heart disease Sister   . Heart disease Mother     History  Substance Use Topics  . Smoking status: Former Smoker -- 1.0 packs/day for 65 years    Types: Cigarettes    Quit date: 08/31/2011  . Smokeless tobacco: Never Used  . Alcohol Use: No    OB History    Grav Para Term Preterm Abortions TAB SAB Ect Mult Living                  Review of Systems  Constitutional: Positive for  activity change, appetite change and fatigue. Negative for fever, diaphoresis and unexpected weight change.  HENT: Negative for congestion and sore throat.   Eyes: Negative for discharge and visual disturbance.  Respiratory: Positive for cough and shortness of breath. Negative for wheezing.        Left-sided pleuritic pain   Cardiovascular: Negative for leg swelling.  Gastrointestinal: Positive for nausea. Negative for vomiting, abdominal pain and diarrhea.  Genitourinary: Negative for dysuria and frequency.  Musculoskeletal: Positive for myalgias and arthralgias.  Skin: Negative for rash.  Neurological: Negative for dizziness and headaches.    Allergies  Contrast media; Iohexol; Penicillins; Sulfa antibiotics; Avelox; and Baclofen  Home Medications   Current Outpatient Rx  Name Route Sig  Dispense Refill  . ALBUTEROL SULFATE HFA 108 (90 BASE) MCG/ACT IN AERS Inhalation Inhale 2 puffs into the lungs every 6 (six) hours as needed for wheezing. 1 Inhaler 2  . ALPRAZOLAM 0.5 MG PO TABS Oral Take 0.5 mg by mouth 3 (three) times daily. For anxiety    . ASPIRIN EC 81 MG PO TBEC Oral Take 81 mg by mouth daily.    . AZITHROMYCIN 250 MG PO TABS Oral Take 1 tablet (250 mg total) by mouth daily. 5 each 0  . CALCIUM-VITAMIN D PO Oral Take 1 tablet by mouth every other day.     Marland Kitchen CEFUROXIME AXETIL 500 MG PO TABS Oral Take 1 tablet (500 mg total) by mouth 2 (two) times daily. Take for 10 days. First dose on 08/27/12 10 tablet 0  . HYDROCHLOROTHIAZIDE 25 MG PO TABS Oral Take 25 mg by mouth daily.    Marland Kitchen LEVOTHYROXINE SODIUM 175 MCG PO TABS Oral Take 175 mcg by mouth daily.    Marland Kitchen LOSARTAN POTASSIUM 25 MG PO TABS Oral Take 25 mg by mouth daily.    . METHYLPREDNISOLONE 4 MG PO KIT  follow package directions 21 tablet 0  . ONDANSETRON HCL 8 MG PO TABS Oral Take by mouth 3 (three) times daily as needed. For nausea    . PANTOPRAZOLE SODIUM 40 MG PO TBEC Oral Take 1 tablet (40 mg total) by mouth daily. 30 tablet 3  . SERTRALINE HCL 50 MG PO TABS Oral Take 50 mg by mouth daily.      BP 124/89  Pulse 108  Temp 98.7 F (37.1 C) (Oral)  Resp 22  SpO2 96%  Physical Exam  Nursing note and vitals reviewed. Constitutional: She is oriented to person, place, and time. She appears well-developed and well-nourished.       Appears comfortable  HENT:  Head: Normocephalic and atraumatic.  Right Ear: External ear normal.  Nose: Nose normal.  Mouth/Throat: Oropharynx is clear and moist. No oropharyngeal exudate.  Eyes: Conjunctivae normal are normal. Right eye exhibits no discharge. Left eye exhibits no discharge. No scleral icterus.  Neck: Neck supple. No JVD present. No thyromegaly present.  Cardiovascular: Regular rhythm and normal heart sounds.  Exam reveals no gallop and no friction rub.         Tachycardic rate over 100 No low extremity edema  Pulmonary/Chest: She has no wheezes. She has rales.       Decrease breath sounds and crackles in left lower lung. Discomfort in left lower lung with deep inspiration.  Abdominal: Soft.       Well-healed medial vertical supraumbilical surgical scar.  Lymphadenopathy:    She has no cervical adenopathy.  Neurological: She is alert and oriented to person, place, and time.  Skin: No  rash noted. She is not diaphoretic.    ED Course  Procedures (including critical care time)  Labs Reviewed - No data to display No results found.   1. Left lower lobe pneumonia     EKG: sinus rithm, 1 degree AV block (present on prior EKG), and microvoltage in V2 and T-wave flattening and inversion in V1 and V2 concerning for septal infarct although minimal changes compared with prior EKG.    MDM  78 y/o female with multiple co-morbidities including but not limited to, DM, CHF, COPD and recent AAA aneurysm repair, recent ED visit for left lower pneumonia. Completed treatment with cefuroxime and day 4/5 of azithromycin today. Not established primary care provider. Here complaining of feeling tired and nauseous still with left sided pleuritic pain. On exam: Afebrile, Alert, talkative. O2 sat 96%, BP 124/89 Mild tachypnea and tachycardia, decreased breath sounds and crackles in left lower lung. No low extremity edema. Concerning for fail outpatient treatment of pneumonia.  Decided to transfer via [facility] to the emergency department for further evaluation and management.    Sharin Grave, MD 09/12/12 [place]

## 2012-09-12 NOTE — ED Notes (Signed)
Pt transferred from urgent care. States that she has had cough, SOB, and back pain.

## 2012-09-14 ENCOUNTER — Encounter: Payer: Self-pay | Admitting: Cardiology

## 2012-09-14 ENCOUNTER — Ambulatory Visit (INDEPENDENT_AMBULATORY_CARE_PROVIDER_SITE_OTHER): Payer: Medicare Other | Admitting: Cardiology

## 2012-09-14 VITALS — BP 107/68 | HR 95 | Ht 65.0 in | Wt 165.4 lb

## 2012-09-14 DIAGNOSIS — J438 Other emphysema: Secondary | ICD-10-CM

## 2012-09-14 DIAGNOSIS — J439 Emphysema, unspecified: Secondary | ICD-10-CM

## 2012-09-14 DIAGNOSIS — R079 Chest pain, unspecified: Secondary | ICD-10-CM

## 2012-09-14 NOTE — Patient Instructions (Addendum)
Your physician wants you to follow-up in:  6 months. You will receive a reminder letter in the mail two months in advance. If you don't receive a letter, please call our office to schedule the follow-up appointment.   

## 2012-09-14 NOTE — Progress Notes (Signed)
Patient ID: Paula [name], female   DOB: 08/01/1927, 78 y.o.   MRN: [id number]   HPI  Patient is seen to followup a history of some chest discomfort. She has been stable. I had cleared her for abdominal aortic aneurysm repair and this was done in February, 2013. Her cardiac status has been stable. She was hospitalized recently with pneumonia. She feels better but not completely recovered.  Allergies  Allergen Reactions  . Contrast Media (Iodinated Diagnostic Agents) Anaphylaxis  . Iohexol Shortness Of Breath and Swelling     Desc: PT STATES SHE HAD A SEVERE REACTION TO IV CONRAST WITH THROAT SWELLING AND SOB. SHE WAS ADMITTED TO THE HOSPITAL. SHE HAS NEVER HAD CONTRAST AGAIN.   Marland Kitchen Penicillins Rash  . Sulfa Antibiotics Anaphylaxis  . Avelox (Moxifloxacin Hcl In Nacl) Rash  . Baclofen     unknown    Current Outpatient Prescriptions  Medication Sig Dispense Refill  . albuterol (PROVENTIL HFA;VENTOLIN HFA) 108 (90 BASE) MCG/ACT inhaler Inhale 2 puffs into the lungs every 6 (six) hours as needed for wheezing.  1 Inhaler  2  . ALPRAZolam (XANAX) 0.5 MG tablet Take 0.5 mg by mouth 3 (three) times daily. For anxiety      . aspirin EC 81 MG tablet Take 81 mg by mouth daily.      Marland Kitchen CALCIUM-VITAMIN D PO Take 1 tablet by mouth every other day.       . hydrochlorothiazide (HYDRODIURIL) 25 MG tablet Take 25 mg by mouth daily.      Marland Kitchen levothyroxine (SYNTHROID, LEVOTHROID) 175 MCG tablet Take 175 mcg by mouth daily.      . ondansetron (ZOFRAN) 8 MG tablet Take by mouth 3 (three) times daily as needed. For nausea      . pantoprazole (PROTONIX) 40 MG tablet Take 1 tablet (40 mg total) by mouth daily.  30 tablet  3  . sertraline (ZOLOFT) 50 MG tablet Take 50 mg by mouth daily.        History   Social History  . Marital Status: Married    Spouse Name: N/A    Number of Children: 4  . Years of Education: N/A   Occupational History  . reired    Social History Main Topics  . Smoking status: Former  Smoker -- 1.0 packs/day for 65 years    Types: Cigarettes    Quit date: 08/31/2011  . Smokeless tobacco: Never Used  . Alcohol Use: No  . Drug Use: No  . Sexually Active: Not Currently    Birth Control/ Protection: Post-menopausal   Other Topics Concern  . Not on file   Social History Narrative  . No narrative on file    Family History  Problem Relation Age of Onset  . Esophageal cancer Father   . Breast cancer Sister   . Colon cancer Sister   . Heart disease Sister   . Heart disease Mother     Past Medical History  Diagnosis Date  . Diabetes mellitus   . COPD (chronic obstructive pulmonary disease)   . Shortness of breath   . Depression   . Recurrent upper respiratory infection (URI)   . Seizures   . Ejection fraction     EF 40%, echo, November, 2012  / in improved, EF 60%, echo, December, 2012  . Abnormal EKG     November, 2012  . Contrast media allergy     Patient feels poorly with contrast  . Status post AAA (abdominal aortic  aneurysm) repair     Surgical repair, Dr. Hart Rochester, December 27, 2011  . Pneumonia     10/2011  . Pre-syncope     vasovagal w/ heart block  . Parotid mass     has refused further eval 10/2011  . Chest pain at rest     Myoview 10/14/2011: ef 60%, ? dist anteroseptal scar w/ sublte peri-infarct ischemia vs. attenuation  . Hyperthyroidism   . CHF (congestive heart failure)   . GERD (gastroesophageal reflux disease)   . Headache   . Anxiety   . Preop cardiovascular exam     Abdominal aortic aneurysm repair by Dr.Lawson  . PONV (postoperative nausea and vomiting)   . Facial tic     L sided  . Macular degeneration     Past Surgical History  Procedure Date  . Cholecystectomy   . Knee cartilage surgery     left  . Sinus surgery with instatrak   . Appendectomy   . Oophorectomy     ovarian cyst  . Cataract extraction     bilateral  . Cardiac catheterization 11/04/11  . Bladder repair   . US echocardiography 10/14/11  . Abdominal  aortic aneurysm repair 12/27/2011    Procedure: ANEURYSM ABDOMINAL AORTIC REPAIR;  Surgeon: Josephina Gip, MD;  Location: [facility] OR;  Service: Vascular;  Laterality: N/A;  Resection and Grafting Abdominal Aortic Aneurysm , Aorta Bi Iliac.    Patient Active Problem List  Diagnosis  . COPD (chronic obstructive pulmonary disease) with emphysema  . Diabetes mellitus type II, controlled  . Parotid mass  . Hypothyroidism  . Sinus bradycardia  . Hypokalemia  . Delirium  . Congestive heart failure  . Ejection fraction  . Abnormal EKG  . Contrast media allergy  . Facial twitching  . Depression  . Anxiety  . Chest pain at rest  . Pre-syncope  . Preop cardiovascular exam  . Nausea & vomiting  . Abdominal pain  . Status post AAA (abdominal aortic aneurysm) repair  . Leaking abdominal aortic aneurysm  . Abdominal pain  . AAA (abdominal aortic aneurysm)  . Community acquired pneumonia  . Leukocytosis    ROS   Patient denies fever, chills, headache, sweats, rash, change in vision, change in hearing, chest pain, nausea vomiting, urinary symptoms. All other systems are reviewed and are negative.  PHYSICAL EXAM  The patient is oriented to person time place. Affect is normal. She is concerned that she always has something wrong with her. She thinks that she is not completely recovered from her pneumonia. She's not having any fevers. There is no jugulovenous distention. There is no respiratory distress. Lungs reveal a few scattered rhonchi. Cardiac exam reveals S1 and S2. The abdomen is soft. There is no peripheral edema.  Filed Vitals:   09/14/12 0946  BP: 107/68  Pulse: 95  Height: 5\' 5"  (1.651 m)  Weight: 165 lb 6.4 oz (75.025 kg)  SpO2: 95%     ASSESSMENT & PLAN

## 2012-09-14 NOTE — Assessment & Plan Note (Signed)
She's not having any recurrent chest pain. No further workup. 

## 2012-09-14 NOTE — Assessment & Plan Note (Signed)
She had recent pneumonia and she needs followup as an outpatient. She says she has an appointment.

## 2012-10-01 ENCOUNTER — Ambulatory Visit: Payer: Medicare Other | Admitting: Occupational Therapy

## 2012-10-14 ENCOUNTER — Ambulatory Visit: Payer: Medicare Other | Admitting: Occupational Therapy

## 2012-10-28 ENCOUNTER — Ambulatory Visit (INDEPENDENT_AMBULATORY_CARE_PROVIDER_SITE_OTHER): Payer: Medicare Other | Admitting: Internal Medicine

## 2012-10-28 ENCOUNTER — Other Ambulatory Visit (INDEPENDENT_AMBULATORY_CARE_PROVIDER_SITE_OTHER): Payer: Medicare Other

## 2012-10-28 ENCOUNTER — Encounter: Payer: Self-pay | Admitting: Internal Medicine

## 2012-10-28 VITALS — BP 138/74 | HR 75 | Temp 98.8°F | Ht 65.0 in | Wt 170.0 lb

## 2012-10-28 DIAGNOSIS — R9389 Abnormal findings on diagnostic imaging of other specified body structures: Secondary | ICD-10-CM

## 2012-10-28 DIAGNOSIS — E119 Type 2 diabetes mellitus without complications: Secondary | ICD-10-CM

## 2012-10-28 DIAGNOSIS — K869 Disease of pancreas, unspecified: Secondary | ICD-10-CM

## 2012-10-28 DIAGNOSIS — G514 Facial myokymia: Secondary | ICD-10-CM

## 2012-10-28 DIAGNOSIS — E039 Hypothyroidism, unspecified: Secondary | ICD-10-CM

## 2012-10-28 DIAGNOSIS — Z23 Encounter for immunization: Secondary | ICD-10-CM

## 2012-10-28 DIAGNOSIS — K8689 Other specified diseases of pancreas: Secondary | ICD-10-CM

## 2012-10-28 DIAGNOSIS — J189 Pneumonia, unspecified organism: Secondary | ICD-10-CM

## 2012-10-28 DIAGNOSIS — I10 Essential (primary) hypertension: Secondary | ICD-10-CM

## 2012-10-28 DIAGNOSIS — G518 Other disorders of facial nerve: Secondary | ICD-10-CM

## 2012-10-28 LAB — HEMOGLOBIN A1C: Hgb A1c MFr Bld: 6.5 % (ref 4.6–6.5)

## 2012-10-28 MED ORDER — LOSARTAN POTASSIUM-HCTZ 50-12.5 MG PO TABS: 1.0000 | ORAL_TABLET | Freq: Every day | ORAL | Status: DC

## 2012-10-28 NOTE — Assessment & Plan Note (Signed)
Recurrent events 2013 reviewed CXR 09/2012 remains abnormal Former smoker and mild COPD -pulm symptoms largely improved Scheduled CT chest to further evaluate

## 2012-10-28 NOTE — Assessment & Plan Note (Signed)
BP Readings from Last 3 Encounters:  10/28/12 138/74  09/14/12 107/68  09/12/12 119/73   Will combine ARB+HCT into one pill - erx to mail order The current medical regimen is effective;  continue present plan and medications.

## 2012-10-28 NOTE — Patient Instructions (Signed)
It was good to see you today. We have reviewed your prior records including labs and tests today Medications reviewed, will combine losartan and HCTZ into one pill - slight dose adjustments for blood pressure control -no other changes at this time. Your prescription(s) have been submitted to your mail order pharmacy. Please take as directed and contact our office if you believe you are having problem(s) with the medication(s). Test(s) ordered today. Your results will be released to MyChart (or called to you) after review, usually within 72hours after test completion. If any changes need to be made, you will be notified at that same time. we'll make referral for CT lungs scan . Our office will contact you regarding appointment(s) once made. Please schedule followup in 3-4 months, call sooner if problems.

## 2012-10-28 NOTE — Progress Notes (Signed)
Subjective:    Patient ID: Paula [name], female    DOB: [birth year], 78 y.o.   MRN: [id number]  HPI  New pt to me and our division - here to establish care Reviewed chronic medical issues:  Chronic L facial spasm - s/p neuro eval and GI eval due to associated with trouble swallow/speech and "pain" - trial botox x3 summer 2013 without significant improvement -take BZs for same  DM2, diet controlled - denies weight change or prior rx "unless i am in the hospital" - checks cbgs sporadically at home  Hypothyroid -the patient reports compliance with medication(s) as prescribed. Denies adverse side effects.  Recurrent PNA - 2 episodes during 2013 - no residual cough or shortness of breath, no productive sputum - reports abnormal CXR with need for CT - prior smoker, quit 2012  Past Medical History  Diagnosis Date  . Depression   . Ejection fraction     EF 40%, echo, November, 2012  / in improved, EF 60%, echo, December, 2012  . Contrast media allergy     Patient feels poorly with contrast  . Status post AAA (abdominal aortic aneurysm) repair     Surgical repair, Dr. Hart Rochester, December 27, 2011  . Pre-syncope     vasovagal w/ heart block  . Parotid mass     has refused further eval 10/2011  . GERD (gastroesophageal reflux disease)   . Anxiety   . Facial tic     L sided spasms - Botox trial summer 2013, ?effective  . Macular degeneration   . AAA (abdominal aortic aneurysm)     s/p repair 12/2011  . Congestive heart failure     NL EF 10/2011 echo   . COPD (chronic obstructive pulmonary disease) with emphysema     quit smoking 2012  . Diabetes mellitus type II, controlled   . Hypothyroidism   . Sinus bradycardia 09/19/2011    Occurring simultaneously with complete heart block    Family History  Problem Relation Age of Onset  . Esophageal cancer Father   . Breast cancer Sister   . Colon cancer Sister   . Heart disease Sister   . Heart disease Mother    History  Substance Use  Topics  . Smoking status: Former Smoker -- 1.0 packs/day for 65 years    Types: Cigarettes    Quit date: 08/31/2011  . Smokeless tobacco: Never Used  . Alcohol Use: No    Review of Systems Constitutional: Negative for fever or weight change.  Respiratory: Negative for cough and shortness of breath.   Cardiovascular: Negative for chest pain or palpitations.  Gastrointestinal: Negative for abdominal pain, no bowel changes.  Musculoskeletal: Negative for gait problem or joint swelling.  Skin: Negative for rash.  Neurological: Negative for dizziness or headache.  No other specific complaints in a complete review of systems (except as listed in HPI above).     Objective:   Physical Exam BP 138/74  Pulse 75  Temp 98.8 F (37.1 C) (Oral)  Ht 5\' 5"  (1.651 m)  Wt 170 lb (77.111 kg)  BMI 28.29 kg/m2  SpO2 93% Wt Readings from Last 3 Encounters:  10/28/12 170 lb (77.111 kg)  09/14/12 165 lb 6.4 oz (75.025 kg)  08/31/12 165 lb 8 oz (75.07 kg)   Constitutional: She appears well-developed and well-nourished. No distress. spouse at side HENT: Head: Normocephalic and atraumatic. Ears: B TMs ok, no erythema or effusion; Nose: Nose normal. Mouth/Throat: Oropharynx is clear and moist. No  oropharyngeal exudate.  Eyes: Conjunctivae and EOM are normal. Pupils are equal, round, and reactive to light. No scleral icterus.  Neck: Normal range of motion. Neck supple. No JVD present. No thyromegaly present.  Cardiovascular: Normal rate, regular rhythm and normal heart sounds.  No murmur heard. No BLE edema. Pulmonary/Chest: Effort normal and breath sounds normal. No respiratory distress. She has no wheezes.  Abdominal: Soft. Bowel sounds are normal. She exhibits no distension. There is no tenderness. no masses Musculoskeletal: Normal range of motion, no joint effusions. No gross deformities Neurological: She is alert and oriented to person, place, and time. No cranial nerve deficit. Coordination  normal.  Skin: Skin is warm and dry. No rash noted. No erythema.  Psychiatric: She has a normal mood and affect. Her behavior is normal. Judgment and thought content normal.   Lab Results  Component Value Date   WBC 16.2* 09/12/2012   HGB 12.1 09/12/2012   HCT 36.5 09/12/2012   PLT 272 09/12/2012   GLUCOSE 115* 09/12/2012   CHOL 144 11/04/2011   TRIG 129 11/04/2011   HDL 38* 11/04/2011   LDLCALC 80 11/04/2011   ALT 12 08/31/2012   AST 16 08/31/2012   NA 138 09/12/2012   K 3.2* 09/12/2012   CL 99 09/12/2012   CREATININE 0.78 09/12/2012   BUN 15 09/12/2012   CO2 28 09/12/2012   TSH 2.654 02/17/2012   INR 1.06 08/24/2012   HGBA1C 5.7* 10/25/2011   Dg Chest Port 1 View  09/12/2012  *RADIOLOGY REPORT*  Clinical Data: Shortness of breath  PORTABLE CHEST - 1 VIEW  Comparison: 09/04/2012  Findings: Cardiomegaly.  Left pleural effusion with subsegmental atelectasis at the left base is similar to priors.  There is mild vascular congestion.  No pneumothorax.  IMPRESSION: Left pleural effusion with subsegmental atelectasis.  Residual pneumonia not definitely observed. Similar appearance to prior exam.   Original Report Authenticated By: Davonna Belling, M.D.      Assessment & Plan:   See problem list. Medications and labs reviewed today.  Time spent with pt/family today 45 minutes, greater than 50% time spent counseling patient on diet diabetes, facial tic, thyroid dz and medication review. Also review of prior records

## 2012-10-28 NOTE — Assessment & Plan Note (Signed)
Diet controlled Check a1c q6-41mo, adjust as needed The patient is asked to make an attempt to improve diet and exercise patterns to aid in medical management of this problem.  Lab Results  Component Value Date   HGBA1C 5.7* 10/25/2011

## 2012-10-28 NOTE — Assessment & Plan Note (Signed)
On replacement since 1993 Check TSH and adjust as needed Lab Results  Component Value Date   TSH 2.654 02/17/2012

## 2012-10-28 NOTE — Assessment & Plan Note (Signed)
Chronic L facial spasms associated with dystonia, dysphagia S/p neuro eval 2013 - trial Botox injections x 3 starting summer 2013 - ?ineffective per pt Uses xanax BZ for spasm control (as well as anxiety) Interval hx reviewed

## 2012-10-29 ENCOUNTER — Ambulatory Visit: Payer: Medicare Other | Attending: Ophthalmology | Admitting: Occupational Therapy

## 2012-10-29 DIAGNOSIS — H53419 Scotoma involving central area, unspecified eye: Secondary | ICD-10-CM | POA: Insufficient documentation

## 2012-10-29 DIAGNOSIS — H3530 Unspecified macular degeneration: Secondary | ICD-10-CM | POA: Insufficient documentation

## 2012-10-29 DIAGNOSIS — IMO0001 Reserved for inherently not codable concepts without codable children: Secondary | ICD-10-CM | POA: Insufficient documentation

## 2012-11-02 ENCOUNTER — Other Ambulatory Visit: Payer: Self-pay | Admitting: Internal Medicine

## 2012-11-02 ENCOUNTER — Ambulatory Visit (INDEPENDENT_AMBULATORY_CARE_PROVIDER_SITE_OTHER)
Admission: RE | Admit: 2012-11-02 | Discharge: 2012-11-02 | Disposition: A | Discharge status: Home / Self Care | Payer: Medicare Other | Source: Ambulatory Visit | Attending: Internal Medicine | Admitting: Internal Medicine

## 2012-11-02 ENCOUNTER — Telehealth: Payer: Self-pay | Admitting: Internal Medicine

## 2012-11-02 DIAGNOSIS — J189 Pneumonia, unspecified organism: Secondary | ICD-10-CM

## 2012-11-02 DIAGNOSIS — R9389 Abnormal findings on diagnostic imaging of other specified body structures: Secondary | ICD-10-CM

## 2012-11-02 DIAGNOSIS — Z79899 Other long term (current) drug therapy: Secondary | ICD-10-CM

## 2012-11-02 DIAGNOSIS — K8689 Other specified diseases of pancreas: Secondary | ICD-10-CM

## 2012-11-02 NOTE — Telephone Encounter (Signed)
Notified pt with results of CT. See note. Also pt is needing BUN & creatitine. Put in comp...lmb

## 2012-11-02 NOTE — Addendum Note (Signed)
Addended by: Rene Paci A on: 11/02/2012 01:25 PM   Modules accepted: Orders

## 2012-11-02 NOTE — Telephone Encounter (Signed)
Pt would like a call regarding results of her CT Chest

## 2012-11-03 ENCOUNTER — Other Ambulatory Visit (INDEPENDENT_AMBULATORY_CARE_PROVIDER_SITE_OTHER): Payer: Medicare Other

## 2012-11-03 DIAGNOSIS — Z79899 Other long term (current) drug therapy: Secondary | ICD-10-CM

## 2012-11-03 LAB — CREATININE, SERUM: Creatinine, Ser: 0.9 mg/dL (ref 0.4–1.2)

## 2012-11-06 ENCOUNTER — Ambulatory Visit (HOSPITAL_COMMUNITY): Payer: Medicare Other

## 2012-11-06 ENCOUNTER — Ambulatory Visit (HOSPITAL_COMMUNITY)
Admission: RE | Admit: 2012-11-06 | Discharge: 2012-11-06 | Disposition: A | Discharge status: Home / Self Care | Payer: Medicare Other | Source: Ambulatory Visit | Attending: Internal Medicine | Admitting: Internal Medicine

## 2012-11-06 DIAGNOSIS — K869 Disease of pancreas, unspecified: Secondary | ICD-10-CM | POA: Insufficient documentation

## 2012-11-06 DIAGNOSIS — R9389 Abnormal findings on diagnostic imaging of other specified body structures: Secondary | ICD-10-CM | POA: Insufficient documentation

## 2012-11-06 DIAGNOSIS — K8689 Other specified diseases of pancreas: Secondary | ICD-10-CM

## 2012-11-06 MED ORDER — IOHEXOL 300 MG/ML  SOLN
100.0000 mL | Freq: Once | INTRAMUSCULAR | Status: AC | PRN
  Administered 2012-11-06: 100 mL via INTRAVENOUS

## 2012-11-09 ENCOUNTER — Other Ambulatory Visit: Payer: Self-pay | Admitting: *Deleted

## 2012-11-09 MED ORDER — SERTRALINE HCL 50 MG PO TABS: 50.0000 mg | ORAL_TABLET | Freq: Every day | ORAL | Status: DC

## 2012-11-09 NOTE — Telephone Encounter (Signed)
R'cd fax from [facility] for refill of Zoloft.

## 2012-11-10 ENCOUNTER — Other Ambulatory Visit: Payer: Self-pay | Admitting: *Deleted

## 2012-11-10 MED ORDER — ONDANSETRON HCL 4 MG PO TABS: 4.0000 mg | ORAL_TABLET | Freq: Three times a day (TID) | ORAL | Status: DC | PRN

## 2012-11-13 ENCOUNTER — Encounter: Payer: Self-pay | Admitting: Pulmonary Disease

## 2012-11-13 ENCOUNTER — Ambulatory Visit (INDEPENDENT_AMBULATORY_CARE_PROVIDER_SITE_OTHER): Payer: Medicare Other | Admitting: Pulmonary Disease

## 2012-11-13 VITALS — BP 122/82 | HR 78 | Temp 99.3°F | Ht 65.0 in | Wt 169.0 lb

## 2012-11-13 DIAGNOSIS — Z9889 Other specified postprocedural states: Secondary | ICD-10-CM

## 2012-11-13 DIAGNOSIS — J189 Pneumonia, unspecified organism: Secondary | ICD-10-CM

## 2012-11-13 DIAGNOSIS — Z8679 Personal history of other diseases of the circulatory system: Secondary | ICD-10-CM

## 2012-11-13 NOTE — Assessment & Plan Note (Signed)
This is actually a loculated  Effusion in the fissure -likely post surgical, dates back on CXR to 10/13 RPt CT in 6 mnths

## 2012-11-13 NOTE — Progress Notes (Signed)
Subjective:    Patient ID: Paula [name], female    DOB: 1927/07/11, 79 y.o.   MRN: [id number]  HPI 79 year old ex smoker ( quit 10/12) as is for evaluation of abnormal imaging study. She has changed primary care physician from Dr. Nicholos Johns to Dr. Felicity Coyer She is reported as having Recurrent PNA - 2 episodes during 2013  -She now has no residual cough or shortness of breath, no productive sputum . She underwent urgent surgery for abdominal aortic aneurysm by Dr. Hart Rochester in 2/13. Although she has recovered, she still ambulates with a walker. CT scan of the chest 12/13 showed Loculated left pleural effusion with suspected rounded atelectasis in the left lower lobe.  Evidence of prior granulomatous disease with calcified left lower lobe granuloma, calcified left hilar lymph nodes and calcified granulomas in the liver and spleen.  CT abdomen clarified a cystic mass in the abdomen, likely a pancreatic pseudocyst. Review of her imaging study shows left lower lobe airspace disease dating back to 10/13. Of note, she is allergic to contrast media and had a reaction with neck swelling requiring steroids. ENT evaluation by Dr. Pollyann Kennedy for hoarseness has been nondiagnostic  Past Medical History  Diagnosis Date  . Depression   . Ejection fraction     EF 40%, echo, November, 2012  / in improved, EF 60%, echo, December, 2012  . Contrast media allergy     Patient feels poorly with contrast  . Status post AAA (abdominal aortic aneurysm) repair     Surgical repair, Dr. Hart Rochester, December 27, 2011  . Pre-syncope     vasovagal w/ heart block  . Parotid mass     has refused further eval 10/2011  . GERD (gastroesophageal reflux disease)   . Anxiety   . Facial tic     L sided spasms - Botox trial summer 2013, ?effective  . Macular degeneration   . AAA (abdominal aortic aneurysm)     s/p repair 12/2011  . Congestive heart failure     NL EF 10/2011 echo   . Diabetes mellitus type II, controlled   .  Hypothyroidism   . Sinus bradycardia 09/19/2011    Occurring simultaneously with complete heart block   . Hypertension     Past Surgical History  Procedure Date  . Cholecystectomy   . Knee cartilage surgery     left  . Sinus surgery with instatrak   . Appendectomy   . Oophorectomy     ovarian cyst  . Cataract extraction     bilateral  . Cardiac catheterization 11/04/11  . Bladder repair   . US echocardiography 10/14/11  . Abdominal aortic aneurysm repair 12/27/2011    Procedure: ANEURYSM ABDOMINAL AORTIC REPAIR;  Surgeon: Josephina Gip, MD;  Location: [facility] OR;  Service: Vascular;  Laterality: N/A;  Resection and Grafting Abdominal Aortic Aneurysm , Aorta Bi Iliac.    Allergies  Allergen Reactions  . Contrast Media (Iodinated Diagnostic Agents) Anaphylaxis  . Iohexol Shortness Of Breath and Swelling     Desc: PT STATES SHE HAD A SEVERE REACTION TO IV CONRAST WITH THROAT SWELLING AND SOB. SHE WAS ADMITTED TO THE HOSPITAL. SHE HAS NEVER HAD CONTRAST AGAIN.   Marland Kitchen Penicillins Rash  . Sulfa Antibiotics Anaphylaxis  . Avelox (Moxifloxacin Hcl In Nacl) Rash  . Baclofen     unknown    History   Social History  . Marital Status: Married    Spouse Name: N/A    Number of Children: 4  .  Years of Education: N/A   Occupational History  . retired    Social History Main Topics  . Smoking status: Former Smoker -- 1.0 packs/day for 30 years    Types: Cigarettes    Quit date: 08/31/2011  . Smokeless tobacco: Never Used  . Alcohol Use: No  . Drug Use: No  . Sexually Active: Not Currently    Birth Control/ Protection: Post-menopausal   Other Topics Concern  . Not on file   Social History Narrative  . No narrative on file       Review of Systems  Constitutional: Negative for appetite change and unexpected weight change.  HENT: Positive for ear pain, congestion and trouble swallowing. Negative for sore throat, sneezing and dental problem.   Respiratory: Positive for shortness  of breath. Negative for cough.   Cardiovascular: Negative for chest pain, palpitations and leg swelling.  Gastrointestinal: Positive for abdominal pain.  Musculoskeletal: Positive for arthralgias. Negative for joint swelling.  Skin: Negative for rash.  Neurological: Negative for headaches.  Psychiatric/Behavioral: Negative for dysphoric mood. The patient is nervous/anxious.        Objective:   Physical Exam  Gen. Pleasant, well-nourished, in no distress, anxious affect ENT - no lesions, no post nasal drip Neck: No JVD, no thyromegaly, no carotid bruits Lungs: no use of accessory muscles, no dullness to percussion, clear without rales or rhonchi  Cardiovascular: Rhythm regular, heart sounds  normal, no murmurs or gallops, no peripheral edema Abdomen: soft and non-tender, no hepatosplenomegaly, BS normal. Musculoskeletal: No deformities, no cyanosis or clubbing Neuro:  alert, non focal       Assessment & Plan:

## 2012-11-13 NOTE — Patient Instructions (Addendum)
We will repeat your scan in 6 months You should see dr Hart Rochester again for the cyst on the pancreas

## 2012-11-15 NOTE — Assessment & Plan Note (Signed)
Surgical repair, Dr. Hart Rochester, December 27, 2011  //   Followup Doppler by Dr. Hart Rochester May, 2013, normal ABIs Pancreatic cyst on CT abd ? Post surgical - she will fu with dr Hart Rochester

## 2012-11-17 ENCOUNTER — Other Ambulatory Visit: Payer: Self-pay | Admitting: Internal Medicine

## 2012-11-17 DIAGNOSIS — Z1231 Encounter for screening mammogram for malignant neoplasm of breast: Secondary | ICD-10-CM

## 2012-11-19 ENCOUNTER — Ambulatory Visit
Admission: RE | Admit: 2012-11-19 | Discharge: 2012-11-19 | Disposition: A | Discharge status: Home / Self Care | Payer: Medicare Other | Source: Ambulatory Visit | Attending: Internal Medicine | Admitting: Internal Medicine

## 2012-11-19 DIAGNOSIS — Z1231 Encounter for screening mammogram for malignant neoplasm of breast: Secondary | ICD-10-CM

## 2012-11-28 ENCOUNTER — Emergency Department (INDEPENDENT_AMBULATORY_CARE_PROVIDER_SITE_OTHER): Payer: Medicare Other

## 2012-11-28 ENCOUNTER — Emergency Department (INDEPENDENT_AMBULATORY_CARE_PROVIDER_SITE_OTHER)
Admission: EM | Admit: 2012-11-28 | Discharge: 2012-11-28 | Disposition: A | Discharge status: Home / Self Care | Payer: Medicare Other | Source: Home / Self Care

## 2012-11-28 ENCOUNTER — Encounter (HOSPITAL_COMMUNITY): Payer: Self-pay | Admitting: Emergency Medicine

## 2012-11-28 DIAGNOSIS — B9789 Other viral agents as the cause of diseases classified elsewhere: Secondary | ICD-10-CM

## 2012-11-28 DIAGNOSIS — B349 Viral infection, unspecified: Secondary | ICD-10-CM

## 2012-11-28 MED ORDER — OSELTAMIVIR PHOSPHATE 75 MG PO CAPS: 75.0000 mg | ORAL_CAPSULE | Freq: Two times a day (BID) | ORAL | Status: DC

## 2012-11-28 NOTE — ED Provider Notes (Signed)
History     CSN: [id number]  Arrival date & time 11/28/12  1604   None     Chief Complaint  Patient presents with  . Lymphadenopathy    HPI: Patient is a 79 y.o. female presenting with cough. The history is provided by the patient.  Cough This is a recurrent problem. The current episode started 2 days ago. The problem has been gradually worsening. The cough is productive of sputum. Associated symptoms include chills, sore throat and myalgias. Pertinent negatives include no chest pain, no headaches, no shortness of breath and no wheezing. She has tried cough syrup for the symptoms. The treatment provided no relief. She is not a smoker. Her past medical history is significant for pneumonia and COPD.  Pt reports 2 day h/o "just feeling sick". She feels like she has had a fever and feels very run down. She reports a sore throat and though she does not appreciate a cough her husband says she coughed all night last night. Significant PMhx of COPD, repair and recurrent PNA. Denies CP, SOB, nausea or vomiting or diarrhea.  Past Medical History  Diagnosis Date  . Depression   . Ejection fraction     EF 40%, echo, November, 2012  / in improved, EF 60%, echo, December, 2012  . Contrast media allergy     Patient feels poorly with contrast  . Status post AAA (abdominal aortic aneurysm) repair     Surgical repair, Dr. Hart Rochester, December 27, 2011  . Pre-syncope     vasovagal w/ heart block  . Parotid mass     has refused further eval 10/2011  . GERD (gastroesophageal reflux disease)   . Anxiety   . Facial tic     L sided spasms - Botox trial summer 2013, ?effective  . Macular degeneration   . AAA (abdominal aortic aneurysm)     s/p repair 12/2011  . Congestive heart failure     NL EF 10/2011 echo   . Diabetes mellitus type II, controlled   . Hypothyroidism   . Sinus bradycardia 09/19/2011    Occurring simultaneously with complete heart block   . Hypertension     Past Surgical History    Procedure Date  . Cholecystectomy   . Knee cartilage surgery     left  . Sinus surgery with instatrak   . Appendectomy   . Oophorectomy     ovarian cyst  . Cataract extraction     bilateral  . Cardiac catheterization 11/04/11  . Bladder repair   . US echocardiography 10/14/11  . Abdominal aortic aneurysm repair 12/27/2011    Procedure: ANEURYSM ABDOMINAL AORTIC REPAIR;  Surgeon: Josephina Gip, MD;  Location: [facility] OR;  Service: Vascular;  Laterality: N/A;  Resection and Grafting Abdominal Aortic Aneurysm , Aorta Bi Iliac.    Family History  Problem Relation Age of Onset  . Esophageal cancer Father   . Breast cancer Sister   . Colon cancer Sister   . Heart disease Sister   . Heart disease Mother     History  Substance Use Topics  . Smoking status: Former Smoker -- 1.0 packs/day for 30 years    Types: Cigarettes    Quit date: 08/31/2011  . Smokeless tobacco: Never Used  . Alcohol Use: No    OB History    Grav Para Term Preterm Abortions TAB SAB Ect Mult Living                  Review of  Systems  Constitutional: Positive for fever, chills and fatigue.  HENT: Positive for sore throat.   Respiratory: Positive for cough. Negative for shortness of breath, wheezing and stridor.   Cardiovascular: Negative for chest pain.  Genitourinary: Negative.   Musculoskeletal: Positive for myalgias.  Neurological: Negative for headaches.  Hematological: Negative.   Psychiatric/Behavioral: Negative.     Allergies  Contrast media; Iohexol; Penicillins; Sulfa antibiotics; Avelox; and Baclofen  Home Medications   Current Outpatient Rx  Name  Route  Sig  Dispense  Refill  . ALBUTEROL SULFATE HFA 108 (90 BASE) MCG/ACT IN AERS   Inhalation   Inhale 2 puffs into the lungs every 6 (six) hours as needed for wheezing.   1 Inhaler   2   . ALPRAZOLAM 0.5 MG PO TABS   Oral   Take 0.5 mg by mouth 3 (three) times daily. For anxiety         . ASPIRIN EC 81 MG PO TBEC   Oral   Take 81  mg by mouth daily.         Marland Kitchen LEVOTHYROXINE SODIUM 175 MCG PO TABS   Oral   Take 175 mcg by mouth daily.         Marland Kitchen LOSARTAN POTASSIUM-HCTZ 50-12.5 MG PO TABS   Oral   Take 1 tablet by mouth daily.   90 tablet   3   . PANTOPRAZOLE SODIUM 40 MG PO TBEC   Oral   Take 1 tablet (40 mg total) by mouth daily.   30 tablet   3   . PREDNISONE 20 MG PO TABS      As directed         . SERTRALINE HCL 50 MG PO TABS   Oral   Take 1 tablet (50 mg total) by mouth daily.   90 tablet   1   . CALCIUM-VITAMIN D PO   Oral   Take 1 tablet by mouth every other day.          . GUAIFENESIN ER 600 MG PO TB12   Oral   Take 1,200 mg by mouth 2 (two) times daily.         Marland Kitchen ONDANSETRON HCL 4 MG PO TABS   Oral   Take 1 tablet (4 mg total) by mouth every 8 (eight) hours as needed for nausea.   20 tablet   1     BP 142/67  Pulse 79  Temp 98.5 F (36.9 C) (Oral)  Resp 20  SpO2 96%  Physical Exam  Constitutional: She is oriented to person, place, and time. She appears well-developed and well-nourished.  HENT:  Head: Normocephalic and atraumatic.  Right Ear: External ear normal.  Left Ear: External ear normal.  Nose: Nose normal.  Mouth/Throat: Oropharynx is clear and moist.  Eyes: Conjunctivae normal are normal.  Neck: Trachea normal and normal range of motion. Neck supple.       Enlarged parotid gland (L) neck  Cardiovascular: Normal rate and regular rhythm.   Pulmonary/Chest: Effort normal.       BBS diminished but otherwise CTA.   Musculoskeletal:       Walks w/ aide of walker  Neurological: She is alert and oriented to person, place, and time.  Skin: Skin is warm and dry.  Psychiatric: She has a normal mood and affect.    ED Course  Procedures   Labs Reviewed - No data to display No results found.   No diagnosis found.  MDM  Though pt is somewhat vague regarding sx's able to confirm, cough, possible fever, malaise and sore throat. CXR neg. Afebrile here.  Will treat w/ Tamiflu and encourage f/u w/ PCP this week.        Leanne Chang, NP 11/29/12 1505

## 2012-11-28 NOTE — ED Notes (Signed)
Asked by registration to speak with patient in waiting room.  When I asked patient what is wrong she gives me a long detailed account of her medical history.  After asking repeatedly what is different today she states she just doesn't feel good and is worried because the flue is going around.  Patient states occasional cough x 2-3 days;  Patient states she doesn't know she coughs.  Her husband told her she did.  Patient denies CP or SOB.  Patient states "Can't you just see me now and send me home."  I explained to patient she would have to wait her turn.  Patient doesn't want to wait in the waiting room because she is afraid she will get sick.  Patient and husband given a mask and placed in smaller waiting room with less people and less chance of infection.  Patient and husband made aware to let us know if anything changes.  Patient history reviewed with Michiel Cowboy RN

## 2012-11-28 NOTE — ED Notes (Addendum)
Pt is here for pain and swelling of neck x2 years Has seen her PCP and Dr. Pollyann Kennedy at [facility] Dr. Pollyann Kennedy reports swelling of silvary gland Pt states that Dr. Pollyann Kennedy says it will go away on its own but pt is needing med tx today for her pain Sx include: difficult swallowing, nauseas, hoarse voice, and reports "just not feeling well" Denies: SOB, fevers, vomiting, diarrhea  She is alert w/no signs of acute distress.

## 2012-11-30 NOTE — ED Provider Notes (Signed)
Medical screening examination/treatment/procedure(s) were performed by resident physician or non-physician practitioner and as supervising physician I was immediately available for consultation/collaboration.   Corneilus Heggie DOUGLAS MD.    Alesi Zachery D Ayiden Milliman, MD 11/30/12 1834 

## 2012-12-08 ENCOUNTER — Ambulatory Visit (INDEPENDENT_AMBULATORY_CARE_PROVIDER_SITE_OTHER): Payer: Medicare Other | Admitting: Internal Medicine

## 2012-12-08 ENCOUNTER — Encounter: Payer: Self-pay | Admitting: Internal Medicine

## 2012-12-08 ENCOUNTER — Telehealth: Payer: Self-pay | Admitting: Internal Medicine

## 2012-12-08 VITALS — BP 122/62 | HR 94 | Temp 98.0°F | Ht 65.0 in | Wt 178.8 lb

## 2012-12-08 DIAGNOSIS — J209 Acute bronchitis, unspecified: Secondary | ICD-10-CM

## 2012-12-08 DIAGNOSIS — R05 Cough: Secondary | ICD-10-CM

## 2012-12-08 MED ORDER — HYDROCODONE-HOMATROPINE 5-1.5 MG/5ML PO SYRP: 5.0000 mL | ORAL_SOLUTION | Freq: Three times a day (TID) | ORAL | Status: DC | PRN

## 2012-12-08 MED ORDER — PREDNISONE 20 MG PO TABS: 10.0000 mg | ORAL_TABLET | Freq: Every day | ORAL | Status: DC

## 2012-12-08 MED ORDER — DOXYCYCLINE HYCLATE 100 MG PO TABS: 100.0000 mg | ORAL_TABLET | Freq: Two times a day (BID) | ORAL | Status: DC

## 2012-12-08 NOTE — Patient Instructions (Signed)

## 2012-12-08 NOTE — Telephone Encounter (Signed)
Patient Information:  Caller Name: [name]  Phone: [phone]  Patient: Paula [name], Paula [name]  Gender: Female  DOB: 1926/11/23  Age: 79 Years  PCP: Rene Paci (Adults only)  Office Follow Up:  Does the office need to follow up with this patient?: No  Instructions For The Office: N/A  RN Note:  Hydrate and humidfy. Instructed to begin using Mucinex as dircted.  Symptoms  Reason For Call & Symptoms: Intermittent, deep productive cough with hoarseness.  Also notes left auxilla soreness and  intermittent left back soreness  Reviewed Health History In EMR: Yes  Reviewed Medications In EMR: Yes  Reviewed Allergies In EMR: Yes  Reviewed Surgeries / Procedures: Yes  Date of Onset of Symptoms: 12/08/2012  Guideline(s) Used:  Cough  Disposition Per Guideline:   See Today in Office  Reason For Disposition Reached:   Known COPD or other severe lung disease (i.e., bronchiectasis, cystic fibrosis, lung surgery) and worsening symptoms (i.e., increased sputum purulence or amount, increased breathing difficulty)  Advice Given:  Call Back If:  Difficulty breathing occurs  Cough lasts more than 3 weeks  You become worse  Appointment Scheduled:  12/08/2012 11:00:00 Appointment Scheduled Provider:  Nicki Reaper

## 2012-12-08 NOTE — Progress Notes (Signed)
HPI  Pt presents to the clinic today with 5 day history of cough and chest congestion. She does have a history of COPD. She has been using her inhaler as needed. She is coughing more at night which is keeping her and her husband awake. She has had bronchitis before. She is a former smoker but in not currently smoking. She has had sick contacts.  Review of Systems    Past Medical History  Diagnosis Date  . Depression   . Ejection fraction     EF 40%, echo, November, 2012  / in improved, EF 60%, echo, December, 2012  . Contrast media allergy     Patient feels poorly with contrast  . Status post AAA (abdominal aortic aneurysm) repair     Surgical repair, Dr. Hart Rochester, December 27, 2011  . Pre-syncope     vasovagal w/ heart block  . Parotid mass     has refused further eval 10/2011  . GERD (gastroesophageal reflux disease)   . Anxiety   . Facial tic     L sided spasms - Botox trial summer 2013, ?effective  . Macular degeneration   . AAA (abdominal aortic aneurysm)     s/p repair 12/2011  . Congestive heart failure     NL EF 10/2011 echo   . Diabetes mellitus type II, controlled   . Hypothyroidism   . Sinus bradycardia 09/19/2011    Occurring simultaneously with complete heart block   . Hypertension     Family History  Problem Relation Age of Onset  . Esophageal cancer Father   . Breast cancer Sister   . Colon cancer Sister   . Heart disease Sister   . Heart disease Mother     History   Social History  . Marital Status: Married    Spouse Name: N/A    Number of Children: 4  . Years of Education: N/A   Occupational History  . retired    Social History Main Topics  . Smoking status: Former Smoker -- 1.0 packs/day for 30 years    Types: Cigarettes    Quit date: 08/31/2011  . Smokeless tobacco: Never Used  . Alcohol Use: No  . Drug Use: No  . Sexually Active: Not Currently    Birth Control/ Protection: Post-menopausal   Other Topics Concern  . Not on file    Social History Narrative  . No narrative on file    Allergies  Allergen Reactions  . Contrast Media (Iodinated Diagnostic Agents) Anaphylaxis  . Iohexol Shortness Of Breath and Swelling     Desc: PT STATES SHE HAD A SEVERE REACTION TO IV CONRAST WITH THROAT SWELLING AND SOB. SHE WAS ADMITTED TO THE HOSPITAL. SHE HAS NEVER HAD CONTRAST AGAIN.   Marland Kitchen Penicillins Rash  . Sulfa Antibiotics Anaphylaxis  . Avelox (Moxifloxacin Hcl In Nacl) Rash  . Baclofen     unknown     Constitutional: Positive fatigue. Denies headache, fever or abrupt weight changes.  HEENT:  Positive sore throat. Denies eye redness, ear pain, ringing in the ears, wax buildup, runny nose or bloody nose. Respiratory: Positive cough and thick green sputum production. Denies difficulty breathing or shortness of breath.  Cardiovascular: Denies chest pain, chest tightness, palpitations or swelling in the hands or feet.   No other specific complaints in a complete review of systems (except as listed in HPI above).  Objective:    BP 122/62  Pulse 94  Temp 98 F (36.7 C) (Oral)  Ht 5'  5" (1.651 m)  Wt 178 lb 12 oz (81.08 kg)  BMI 29.75 kg/m2  SpO2 95% Wt Readings from Last 3 Encounters:  12/08/12 178 lb 12 oz (81.08 kg)  11/13/12 169 lb (76.658 kg)  10/28/12 170 lb (77.111 kg)    General: Appears her stated age, well developed, well nourished in NAD. HEENT: Head: normal shape and size; Eyes: sclera white, no icterus, conjunctiva pink, PERRLA and EOMs intact; Ears: Tm's gray and intact, normal light reflex; Nose: mucosa pink and moist, septum midline; Throat/Mouth: + PND. Teeth present, mucosa pink and moist, no exudate noted, no lesions or ulcerations noted.  Neck: Mild cervical lymphadenopathy. Neck supple, trachea midline. No massses, lumps or thyromegaly present.  Cardiovascular: Normal rate and rhythm. S1,S2 noted.  No murmur, rubs or gallops noted. No JVD or BLE edema. No carotid bruits  noted. Pulmonary/Chest: Normal effort and scattered ronchi and wheezing throughout. No respiratory distress.     Assessment & Plan:   Acute bronchitis with COPD, new onset with additional workup required:  eRx for Levaquin eRx for hycodan cough syrup eRx for prednisone x 7days  RTC as needed or if symptoms persist.

## 2012-12-28 ENCOUNTER — Encounter: Payer: Self-pay | Admitting: Internal Medicine

## 2012-12-28 ENCOUNTER — Ambulatory Visit (INDEPENDENT_AMBULATORY_CARE_PROVIDER_SITE_OTHER): Payer: Medicare Other | Admitting: Internal Medicine

## 2012-12-28 ENCOUNTER — Other Ambulatory Visit (INDEPENDENT_AMBULATORY_CARE_PROVIDER_SITE_OTHER): Payer: Medicare Other

## 2012-12-28 ENCOUNTER — Other Ambulatory Visit: Payer: Self-pay | Admitting: *Deleted

## 2012-12-28 VITALS — BP 120/72 | HR 97 | Temp 99.4°F | Ht 65.0 in | Wt 176.8 lb

## 2012-12-28 DIAGNOSIS — N390 Urinary tract infection, site not specified: Secondary | ICD-10-CM

## 2012-12-28 DIAGNOSIS — R350 Frequency of micturition: Secondary | ICD-10-CM

## 2012-12-28 DIAGNOSIS — R1313 Dysphagia, pharyngeal phase: Secondary | ICD-10-CM

## 2012-12-28 DIAGNOSIS — R5383 Other fatigue: Secondary | ICD-10-CM

## 2012-12-28 DIAGNOSIS — M2550 Pain in unspecified joint: Secondary | ICD-10-CM

## 2012-12-28 DIAGNOSIS — R5381 Other malaise: Secondary | ICD-10-CM

## 2012-12-28 LAB — CBC WITH DIFFERENTIAL/PLATELET
Basophils Absolute: 0.1 10*3/uL (ref 0.0–0.1)
Eosinophils Absolute: 0.5 10*3/uL (ref 0.0–0.7)
HCT: 34.3 % — ABNORMAL LOW (ref 36.0–46.0)
Lymphs Abs: 2.7 10*3/uL (ref 0.7–4.0)
MCV: 99.8 fl (ref 78.0–100.0)
Monocytes Absolute: 0.8 10*3/uL (ref 0.1–1.0)
Neutrophils Relative %: 68 % (ref 43.0–77.0)
Platelets: 243 10*3/uL (ref 150.0–400.0)
RDW: 14.9 % — ABNORMAL HIGH (ref 11.5–14.6)
WBC: 12.5 10*3/uL — ABNORMAL HIGH (ref 4.5–10.5)

## 2012-12-28 LAB — BASIC METABOLIC PANEL
BUN: 17 mg/dL (ref 6–23)
Chloride: 101 mEq/L (ref 96–112)
Creatinine, Ser: 0.9 mg/dL (ref 0.4–1.2)
Glucose, Bld: 152 mg/dL — ABNORMAL HIGH (ref 70–99)
Potassium: 3.3 mEq/L — ABNORMAL LOW (ref 3.5–5.1)

## 2012-12-28 LAB — URINALYSIS, ROUTINE W REFLEX MICROSCOPIC
Bilirubin Urine: NEGATIVE
Nitrite: NEGATIVE
Total Protein, Urine: NEGATIVE
Urobilinogen, UA: 0.2 (ref 0.0–1.0)

## 2012-12-28 MED ORDER — MELOXICAM 7.5 MG PO TABS: 7.5000 mg | ORAL_TABLET | Freq: Every day | ORAL | Status: DC

## 2012-12-28 MED ORDER — PANTOPRAZOLE SODIUM 40 MG PO TBEC: 40.0000 mg | DELAYED_RELEASE_TABLET | Freq: Every day | ORAL | Status: DC

## 2012-12-28 MED ORDER — CIPROFLOXACIN HCL 250 MG PO TABS: 250.0000 mg | ORAL_TABLET | Freq: Two times a day (BID) | ORAL | Status: DC

## 2012-12-28 NOTE — Telephone Encounter (Signed)
Pt left msg forgot to ask for refill on her protonix. Inform husband will [facility] to [facility].../lmb

## 2012-12-28 NOTE — Progress Notes (Signed)
Subjective:    Patient ID: Paula [name], female    DOB: 1927/02/28, 79 y.o.   MRN: [id number]  HPI  complains of fatigue and arthralgias - B shoulders, arms and low back Denies trauma, fall or overuse - not associated with joint swelling  Reviewed chronic medical issues:  Chronic L facial spasm - s/p neuro eval and GI eval due to associated with trouble swallow/speech and "pain" - trial botox x3 summer 2013 without significant improvement -take BZs for same, now baclofen - improved  DM2, diet controlled - denies weight change or prior rx "unless i am in the hospital" - checks cbgs sporadically at home  Hypothyroid -the patient reports compliance with medication(s) as prescribed. Denies adverse side effects.  Recurrent PNA - 2 episodes during 2013 - no residual cough or shortness of breath, no productive sputum - prior smoker, quit 2012 - following with pulm for same (abnormal CT/CXR) -  Past Medical History  Diagnosis Date  . Depression   . Ejection fraction     EF 40%, echo, November, 2012  / in improved, EF 60%, echo, December, 2012  . Contrast media allergy     Patient feels poorly with contrast  . Status post AAA (abdominal aortic aneurysm) repair     Surgical repair, Dr. Hart Rochester, December 27, 2011  . Pre-syncope     vasovagal w/ heart block  . Parotid mass     has refused further eval 10/2011  . GERD (gastroesophageal reflux disease)   . Anxiety   . Facial tic     L sided spasms - Botox trial summer 2013, ?effective  . Macular degeneration   . AAA (abdominal aortic aneurysm)     s/p repair 12/2011  . Congestive heart failure     NL EF 10/2011 echo   . Diabetes mellitus type II, controlled   . Hypothyroidism   . Sinus bradycardia 09/19/2011    Occurring simultaneously with complete heart block   . Hypertension     Review of Systems  Constitutional: Negative for fever or weight change. +fatigue Respiratory: Negative for cough and shortness of breath.    Cardiovascular: Negative for chest pain or palpitations.     Objective:   Physical Exam  BP 120/72  Pulse 97  Temp(Src) 99.4 F (37.4 C) (Oral)  Ht 5\' 5"  (1.651 m)  Wt 176 lb 12.8 oz (80.196 kg)  BMI 29.42 kg/m2  SpO2 92% Wt Readings from Last 3 Encounters:  12/28/12 176 lb 12.8 oz (80.196 kg)  12/08/12 178 lb 12 oz (81.08 kg)  11/13/12 169 lb (76.658 kg)   Constitutional: She appears well-developed and well-nourished. No distress. spouse at side Cardiovascular: Normal rate, regular rhythm and normal heart sounds.  No murmur heard. No BLE edema. Pulmonary/Chest: Effort normal and breath sounds normal. No respiratory distress. She has no wheezes.  Musculoskeletal: B Shoulder: Full range of motion. Neurovascularly intact distally. Good strength with stress of rotator cuff but causes pain. Positive impingement signs. Otherwise, normal range of motion, no joint effusions. No gross deformities Psychiatric: She has a normal mood and affect. Her behavior is normal. Judgment and thought content normal.   Lab Results  Component Value Date   WBC 16.2* 09/12/2012   HGB 12.1 09/12/2012   HCT 36.5 09/12/2012   PLT 272 09/12/2012   GLUCOSE 115* 09/12/2012   CHOL 144 11/04/2011   TRIG 129 11/04/2011   HDL 38* 11/04/2011   LDLCALC 80 11/04/2011   ALT 12 08/31/2012  AST 16 08/31/2012   NA 138 09/12/2012   K 3.2* 09/12/2012   CL 99 09/12/2012   CREATININE 0.9 11/03/2012   BUN 17 11/03/2012   CO2 28 09/12/2012   TSH 0.50 10/28/2012   INR 1.06 08/24/2012   HGBA1C 6.5 10/28/2012      Assessment & Plan:   See problem list. Medications and labs reviewed today.  Fatigue - nonspecific symptoms/exam - check screening labs  LGF - check UA - exam otherwise on focal, nontoxic  arthralgia - add low dose meloxicam

## 2012-12-28 NOTE — Addendum Note (Signed)
Addended by: Rene Paci A on: 12/28/2012 08:13 PM   Modules accepted: Orders

## 2012-12-28 NOTE — Patient Instructions (Addendum)
It was good to see you today. We have reviewed your prior records including labs and tests today Test(s) ordered today. Your results will be released to MyChart (or called to you) after review, usually within 72hours after test completion. If any changes need to be made, you will be notified at that same time. Medications reviewed, start meloxicam once daily for aches and pains (especially back pain) -no other changes at this time. Your prescription(s) have been submitted to your pharmacy. Please take as directed and contact our office if you believe you are having problem(s) with the medication(s). Please schedule followup in 3-4 months, call sooner if problems.

## 2012-12-30 ENCOUNTER — Telehealth: Payer: Self-pay | Admitting: *Deleted

## 2012-12-30 MED ORDER — TRIMETHOPRIM 100 MG PO TABS: 100.0000 mg | ORAL_TABLET | Freq: Two times a day (BID) | ORAL | Status: DC

## 2012-12-30 NOTE — Telephone Encounter (Signed)
Ok to stop cipro Other abx allergies reviewed Use trimethaprim bid x 5 days for bladder infection - erx done

## 2012-12-30 NOTE — Telephone Encounter (Signed)
Pt states she is not feeling good. Feeling worst than she came in. She states she taking 2 days of the cipro that was rx she is very weak, just been laying around today. Not able to urinate much. ? If sxs coming from cipro. Requesting md advisement...Raechel Chute

## 2012-12-31 NOTE — Telephone Encounter (Signed)
Patient informed of MD instructions on medication 

## 2013-01-01 ENCOUNTER — Ambulatory Visit (INDEPENDENT_AMBULATORY_CARE_PROVIDER_SITE_OTHER): Payer: Medicare Other | Admitting: Internal Medicine

## 2013-01-01 ENCOUNTER — Telehealth: Payer: Self-pay | Admitting: Internal Medicine

## 2013-01-01 ENCOUNTER — Encounter: Payer: Self-pay | Admitting: Internal Medicine

## 2013-01-01 VITALS — BP 140/72 | HR 97 | Temp 99.2°F | Wt 184.0 lb

## 2013-01-01 DIAGNOSIS — R05 Cough: Secondary | ICD-10-CM

## 2013-01-01 MED ORDER — PREDNISONE (PAK) 10 MG PO TABS: 10.0000 mg | ORAL_TABLET | ORAL | Status: DC

## 2013-01-01 MED ORDER — CETIRIZINE HCL 10 MG PO TABS: 10.0000 mg | ORAL_TABLET | Freq: Every day | ORAL | Status: DC

## 2013-01-01 NOTE — Progress Notes (Signed)
Subjective:    Patient ID: Paula [name], female    DOB: 08/18/1927, 79 y.o.   MRN: [id number]  Cough This is a chronic problem. The current episode started more than 1 year ago. The problem has been waxing and waning. The problem occurs hourly. The cough is productive of sputum (Clear sputum reported). Associated symptoms include myalgias, postnasal drip and wheezing. Pertinent negatives include no chest pain, fever, headaches, heartburn, hemoptysis, nasal congestion, sore throat, shortness of breath, sweats or weight loss. The symptoms are aggravated by lying down and cold air. She has tried a beta-agonist inhaler and OTC cough suppressant for the symptoms. The treatment provided mild relief. Her past medical history is significant for environmental allergies. There is no history of asthma, bronchiectasis or COPD.  Wheezing  Associated symptoms include coughing. Pertinent negatives include no chest pain, fever, headaches, hemoptysis, shortness of breath or sore throat. There is no history of asthma or COPD.    Past Medical History  Diagnosis Date  . Depression   . Ejection fraction     EF 40%, echo, November, 2012  / in improved, EF 60%, echo, December, 2012  . Contrast media allergy     Patient feels poorly with contrast  . Status post AAA (abdominal aortic aneurysm) repair     Surgical repair, Dr. Hart Rochester, December 27, 2011  . Pre-syncope     vasovagal w/ heart block  . Parotid mass     has refused further eval 10/2011  . GERD (gastroesophageal reflux disease)   . Anxiety   . Facial tic     L sided spasms - Botox trial summer 2013, ?effective  . Macular degeneration   . AAA (abdominal aortic aneurysm)     s/p repair 12/2011  . Congestive heart failure     NL EF 10/2011 echo   . Diabetes mellitus type II, controlled   . Hypothyroidism   . Sinus bradycardia 09/19/2011    Occurring simultaneously with complete heart block   . Hypertension     Review of Systems  Constitutional:  Positive for fatigue. Negative for fever and weight loss.  HENT: Positive for postnasal drip. Negative for sore throat.   Respiratory: Positive for cough and wheezing. Negative for hemoptysis and shortness of breath.   Cardiovascular: Negative for chest pain.  Gastrointestinal: Negative for heartburn.  Musculoskeletal: Positive for myalgias.  Allergic/Immunologic: Positive for environmental allergies.  Neurological: Negative for headaches.       Objective:   Physical Exam  BP 140/72  Pulse 97  Temp(Src) 99.2 F (37.3 C) (Oral)  Wt 184 lb (83.462 kg)  BMI 30.62 kg/m2  SpO2 95% Wt Readings from Last 3 Encounters:  01/01/13 184 lb (83.462 kg)  12/28/12 176 lb 12.8 oz (80.196 kg)  12/08/12 178 lb 12 oz (81.08 kg)   Constitutional: She is overweight, but appears well-developed and well-nourished. No distress. spouse at side Cardiovascular: Normal rate, regular rhythm and normal heart sounds.  No murmur heard. No BLE edema. Pulmonary/Chest: Effort normal and breath sounds bilaterally diminished at bases. No respiratory distress. She has soft and expiratory wheezes. also with pseudo-wheeze on expiration Psychiatric: She has a normal mood and affect. Her behavior is normal. Judgment and thought content normal.   Lab Results  Component Value Date   WBC 12.5* 12/28/2012   HGB 11.5* 12/28/2012   HCT 34.3* 12/28/2012   PLT 243.0 12/28/2012   GLUCOSE 152* 12/28/2012   CHOL 144 11/04/2011   TRIG 129 11/04/2011  HDL 38* 11/04/2011   LDLCALC 80 11/04/2011   ALT 12 08/31/2012   AST 16 08/31/2012   NA 139 12/28/2012   K 3.3* 12/28/2012   CL 101 12/28/2012   CREATININE 0.9 12/28/2012   BUN 17 12/28/2012   CO2 29 12/28/2012   TSH 0.80 12/28/2012   INR 1.06 08/24/2012   HGBA1C 6.5 10/28/2012   Dg Chest 2 View  11/28/2012  *RADIOLOGY REPORT*   IMPRESSION: Left hemidiaphragm elevation with left-sided pleural fluid and adjacent airspace disease, favored to represent rounded atelectasis on prior  CT.  Please see that report.  Lower lobe predominant interstitial thickening is likely related to COPD/chronic bronchitis.  No convincing evidence of acute superimposed process.  Cardiomegaly without congestive failure.   Original Report Authenticated By: Jeronimo Greaves, M.D.       Assessment & Plan:   Cough, suspect multifactorial vocal cord dysfunction with pseudo-wheeze Abnormal chest x-ray, recurrent pneumonia? chronic since October 2013 -following with pulmonary for same Low-grade fever, on trimethoprim currently   Recommended treatment of cyclical cough with TPI, Mucinex and daily antihistamine. Carefully explained the role of each medication in the management of triggers for chronic cough. Encouraged to continue and complete antibiotics as prescribed Will refer to new ear nose throat specialist as per request, has been working with Dr. Pollyann Kennedy for hoarseness

## 2013-01-01 NOTE — Telephone Encounter (Signed)
Patient Information:  Caller Name: Cesar  Phone: [phone]  Patient: Paula, [name]  Gender: Female  DOB: 1927-05-04  Age: 79 Years  PCP: Rene Paci (Adults only)  Office Follow Up:  Does the office need to follow up with this patient?: No  Instructions For The Office: N/A  RN Note:  Pt had OV 12/28/2012  for malaise, fatique and diagnosed with UTI and is taking Trimethoprim. Pt comments that she is feeling more SOD and  has been using Albuterol BID,  but doesn't seem to help with wheezing, nor SOB.  Pt denied wheezing during call.  Symptoms  Reason For Call & Symptoms: Pt calling with complaint of  wheezing  episodes for "several weeks" .   Symptoms will improve and go away, but  since 12/30/2012 the wheezing  has returned and is more frequent and having more SOB.  Reviewed Health History In EMR: Yes  Reviewed Medications In EMR: Yes  Reviewed Allergies In EMR: Yes  Reviewed Surgeries / Procedures: Yes  Date of Onset of Symptoms: 12/01/2012  Treatments Tried: Albuterol  Treatments Tried Worked: No  Guideline(s) Used:  Cough  Disposition Per Guideline:   Go to ED Now  Reason For Disposition Reached:   Difficulty breathing  Advice Given:  Call Back If:  Difficulty breathing occurs  You become worse  RN Overrode Recommendation:  Make Appointment  Office is open,  Appt scheduled for 3:15pm with Dr,. Leschber versus sending pt to ED   Appointment Scheduled:  01/01/2013 15:15:00 Appointment Scheduled Provider:  Rene Paci (Adults only)

## 2013-01-01 NOTE — Assessment & Plan Note (Signed)
Pulmonary evaluation for same early January 2014 reviewed Chronically loculated effusion left lower lobe as noted on CT chest December 2013, present since postop chest x-ray October 2013 Planning followup CT in 6 months (July 2014) Will treat bronchospasticcomponent of wheeze with prednisone taper x6 days at this time Continue albuterol inhaler as prescribed with daily Mucinex and antihistamine plus PPI

## 2013-01-01 NOTE — Patient Instructions (Signed)
It was good to see you today. We have reviewed your prior records including labs and tests today Medications reviewed Take prednisone taper for next 6 days - Take zyrtec, protonix AND Mucinex everyday to help control your "upper" wheeze no other medication changes at this time. Your prescription(s) have been submitted to your pharmacy. Please take as directed and contact our office if you believe you are having problem(s) with the medication(s). Please schedule followup in 3-4 months, call sooner if problems.

## 2013-01-02 ENCOUNTER — Emergency Department (HOSPITAL_COMMUNITY): Payer: Medicare Other

## 2013-01-02 ENCOUNTER — Inpatient Hospital Stay (HOSPITAL_COMMUNITY)
Admission: EM | Admit: 2013-01-02 | Discharge: 2013-01-04 | DRG: 192 | Disposition: A | Discharge status: Home / Self Care | Payer: Medicare Other | Attending: Internal Medicine | Admitting: Internal Medicine

## 2013-01-02 ENCOUNTER — Encounter (HOSPITAL_COMMUNITY): Payer: Self-pay | Admitting: *Deleted

## 2013-01-02 DIAGNOSIS — K118 Other diseases of salivary glands: Secondary | ICD-10-CM

## 2013-01-02 DIAGNOSIS — Z8679 Personal history of other diseases of the circulatory system: Secondary | ICD-10-CM

## 2013-01-02 DIAGNOSIS — R55 Syncope and collapse: Secondary | ICD-10-CM

## 2013-01-02 DIAGNOSIS — Z87891 Personal history of nicotine dependence: Secondary | ICD-10-CM

## 2013-01-02 DIAGNOSIS — F411 Generalized anxiety disorder: Secondary | ICD-10-CM | POA: Diagnosis present

## 2013-01-02 DIAGNOSIS — K219 Gastro-esophageal reflux disease without esophagitis: Secondary | ICD-10-CM | POA: Diagnosis present

## 2013-01-02 DIAGNOSIS — H3530 Unspecified macular degeneration: Secondary | ICD-10-CM | POA: Diagnosis present

## 2013-01-02 DIAGNOSIS — IMO0002 Reserved for concepts with insufficient information to code with codable children: Secondary | ICD-10-CM

## 2013-01-02 DIAGNOSIS — I714 Abdominal aortic aneurysm, without rupture, unspecified: Secondary | ICD-10-CM

## 2013-01-02 DIAGNOSIS — Z7982 Long term (current) use of aspirin: Secondary | ICD-10-CM

## 2013-01-02 DIAGNOSIS — R9430 Abnormal result of cardiovascular function study, unspecified: Secondary | ICD-10-CM

## 2013-01-02 DIAGNOSIS — F329 Major depressive disorder, single episode, unspecified: Secondary | ICD-10-CM

## 2013-01-02 DIAGNOSIS — F32A Depression, unspecified: Secondary | ICD-10-CM

## 2013-01-02 DIAGNOSIS — E039 Hypothyroidism, unspecified: Secondary | ICD-10-CM

## 2013-01-02 DIAGNOSIS — D72829 Elevated white blood cell count, unspecified: Secondary | ICD-10-CM

## 2013-01-02 DIAGNOSIS — F419 Anxiety disorder, unspecified: Secondary | ICD-10-CM

## 2013-01-02 DIAGNOSIS — R0689 Other abnormalities of breathing: Secondary | ICD-10-CM

## 2013-01-02 DIAGNOSIS — R0600 Dyspnea, unspecified: Secondary | ICD-10-CM

## 2013-01-02 DIAGNOSIS — F3289 Other specified depressive episodes: Secondary | ICD-10-CM | POA: Diagnosis present

## 2013-01-02 DIAGNOSIS — Z803 Family history of malignant neoplasm of breast: Secondary | ICD-10-CM

## 2013-01-02 DIAGNOSIS — J438 Other emphysema: Principal | ICD-10-CM

## 2013-01-02 DIAGNOSIS — Z881 Allergy status to other antibiotic agents status: Secondary | ICD-10-CM

## 2013-01-02 DIAGNOSIS — Z880 Allergy status to penicillin: Secondary | ICD-10-CM

## 2013-01-02 DIAGNOSIS — J189 Pneumonia, unspecified organism: Secondary | ICD-10-CM

## 2013-01-02 DIAGNOSIS — E119 Type 2 diabetes mellitus without complications: Secondary | ICD-10-CM

## 2013-01-02 DIAGNOSIS — Z800 Family history of malignant neoplasm of digestive organs: Secondary | ICD-10-CM

## 2013-01-02 DIAGNOSIS — I509 Heart failure, unspecified: Secondary | ICD-10-CM

## 2013-01-02 DIAGNOSIS — I10 Essential (primary) hypertension: Secondary | ICD-10-CM

## 2013-01-02 DIAGNOSIS — Z882 Allergy status to sulfonamides status: Secondary | ICD-10-CM

## 2013-01-02 DIAGNOSIS — Z91041 Radiographic dye allergy status: Secondary | ICD-10-CM

## 2013-01-02 DIAGNOSIS — G514 Facial myokymia: Secondary | ICD-10-CM

## 2013-01-02 DIAGNOSIS — E1151 Type 2 diabetes mellitus with diabetic peripheral angiopathy without gangrene: Secondary | ICD-10-CM | POA: Diagnosis present

## 2013-01-02 DIAGNOSIS — E876 Hypokalemia: Secondary | ICD-10-CM

## 2013-01-02 DIAGNOSIS — Z79899 Other long term (current) drug therapy: Secondary | ICD-10-CM

## 2013-01-02 DIAGNOSIS — R001 Bradycardia, unspecified: Secondary | ICD-10-CM

## 2013-01-02 DIAGNOSIS — J439 Emphysema, unspecified: Secondary | ICD-10-CM

## 2013-01-02 DIAGNOSIS — Z8249 Family history of ischemic heart disease and other diseases of the circulatory system: Secondary | ICD-10-CM

## 2013-01-02 LAB — URINALYSIS, ROUTINE W REFLEX MICROSCOPIC
Specific Gravity, Urine: 1.011 (ref 1.005–1.030)
Urobilinogen, UA: 0.2 mg/dL (ref 0.0–1.0)
pH: 6.5 (ref 5.0–8.0)

## 2013-01-02 LAB — CBC
HCT: 34.6 % — ABNORMAL LOW (ref 36.0–46.0)
Hemoglobin: 11.7 g/dL — ABNORMAL LOW (ref 12.0–15.0)
MCV: 100.3 fL — ABNORMAL HIGH (ref 78.0–100.0)
RBC: 3.45 MIL/uL — ABNORMAL LOW (ref 3.87–5.11)
WBC: 12 10*3/uL — ABNORMAL HIGH (ref 4.0–10.5)

## 2013-01-02 LAB — POCT I-STAT, CHEM 8
Calcium, Ion: 1.23 mmol/L (ref 1.13–1.30)
Chloride: 109 mEq/L (ref 96–112)
Creatinine, Ser: 0.8 mg/dL (ref 0.50–1.10)
Glucose, Bld: 314 mg/dL — ABNORMAL HIGH (ref 70–99)
Hemoglobin: 11.6 g/dL — ABNORMAL LOW (ref 12.0–15.0)
Potassium: 4.1 mEq/L (ref 3.5–5.1)

## 2013-01-02 LAB — CREATININE, SERUM
GFR calc Af Amer: 68 mL/min — ABNORMAL LOW (ref >90)
GFR calc non Af Amer: 58 mL/min — ABNORMAL LOW (ref >90)

## 2013-01-02 LAB — GLUCOSE, CAPILLARY
Glucose-Capillary: 257 mg/dL — ABNORMAL HIGH (ref 70–99)
Glucose-Capillary: 153 mg/dL — ABNORMAL HIGH (ref 70–99)

## 2013-01-02 MED ORDER — FUROSEMIDE 10 MG/ML IJ SOLN
40.0000 mg | Freq: Three times a day (TID) | INTRAMUSCULAR | Status: DC
  Administered 2013-01-02 – 2013-01-04 (×5): 40 mg via INTRAVENOUS
  Filled 2013-01-02 (×8): qty 4

## 2013-01-02 MED ORDER — ONDANSETRON HCL 4 MG/2ML IJ SOLN
4.0000 mg | Freq: Four times a day (QID) | INTRAMUSCULAR | Status: DC | PRN
  Administered 2013-01-03 (×2): 4 mg via INTRAVENOUS
  Filled 2013-01-02 (×2): qty 2

## 2013-01-02 MED ORDER — ALPRAZOLAM 0.5 MG PO TABS
0.5000 mg | ORAL_TABLET | Freq: Three times a day (TID) | ORAL | Status: DC
  Administered 2013-01-02 – 2013-01-04 (×5): 0.5 mg via ORAL
  Filled 2013-01-02 (×5): qty 1

## 2013-01-02 MED ORDER — SODIUM CHLORIDE 0.9 % IJ SOLN: 3.0000 mL | INTRAMUSCULAR | Status: DC | PRN

## 2013-01-02 MED ORDER — HEPARIN SODIUM (PORCINE) 5000 UNIT/ML IJ SOLN
5000.0000 [IU] | Freq: Three times a day (TID) | INTRAMUSCULAR | Status: DC
  Filled 2013-01-02: qty 1

## 2013-01-02 MED ORDER — INSULIN ASPART 100 UNIT/ML ~~LOC~~ SOLN
0.0000 [IU] | Freq: Three times a day (TID) | SUBCUTANEOUS | Status: DC
  Administered 2013-01-03: 3 [IU] via SUBCUTANEOUS
  Administered 2013-01-03: 2 [IU] via SUBCUTANEOUS
  Administered 2013-01-03: 3 [IU] via SUBCUTANEOUS
  Administered 2013-01-04 (×2): 2 [IU] via SUBCUTANEOUS

## 2013-01-02 MED ORDER — LEVOTHYROXINE SODIUM 175 MCG PO TABS
175.0000 ug | ORAL_TABLET | Freq: Every day | ORAL | Status: DC
  Administered 2013-01-03 – 2013-01-04 (×2): 175 ug via ORAL
  Filled 2013-01-02 (×3): qty 1

## 2013-01-02 MED ORDER — ACETAMINOPHEN 325 MG PO TABS
650.0000 mg | ORAL_TABLET | ORAL | Status: DC | PRN
  Administered 2013-01-03 (×2): 650 mg via ORAL
  Filled 2013-01-02 (×2): qty 2

## 2013-01-02 MED ORDER — ALBUTEROL SULFATE HFA 108 (90 BASE) MCG/ACT IN AERS
2.0000 | INHALATION_SPRAY | Freq: Four times a day (QID) | RESPIRATORY_TRACT | Status: DC | PRN
  Administered 2013-01-03: 2 via RESPIRATORY_TRACT
  Filled 2013-01-02: qty 6.7

## 2013-01-02 MED ORDER — FUROSEMIDE 10 MG/ML IJ SOLN
20.0000 mg | Freq: Once | INTRAMUSCULAR | Status: AC
  Administered 2013-01-02: 20 mg via INTRAVENOUS
  Filled 2013-01-02: qty 2

## 2013-01-02 MED ORDER — PREDNISONE (PAK) 10 MG PO TABS: 10.0000 mg | ORAL_TABLET | ORAL | Status: DC

## 2013-01-02 MED ORDER — LORATADINE 10 MG PO TABS
10.0000 mg | ORAL_TABLET | Freq: Every day | ORAL | Status: DC
  Administered 2013-01-03 – 2013-01-04 (×2): 10 mg via ORAL
  Filled 2013-01-02 (×2): qty 1

## 2013-01-02 MED ORDER — INSULIN ASPART 100 UNIT/ML ~~LOC~~ SOLN: 5.0000 [IU] | Freq: Once | SUBCUTANEOUS | Status: DC

## 2013-01-02 MED ORDER — SODIUM CHLORIDE 0.9 % IV SOLN: 250.0000 mL | INTRAVENOUS | Status: DC | PRN

## 2013-01-02 MED ORDER — MELOXICAM 7.5 MG PO TABS
7.5000 mg | ORAL_TABLET | Freq: Every day | ORAL | Status: DC
  Administered 2013-01-03 – 2013-01-04 (×2): 7.5 mg via ORAL
  Filled 2013-01-02 (×2): qty 1

## 2013-01-02 MED ORDER — SODIUM CHLORIDE 0.9 % IJ SOLN
3.0000 mL | Freq: Two times a day (BID) | INTRAMUSCULAR | Status: DC
  Administered 2013-01-02: 3 mL via INTRAVENOUS

## 2013-01-02 MED ORDER — BACLOFEN 5 MG HALF TABLET
5.0000 mg | ORAL_TABLET | Freq: Every day | ORAL | Status: DC
  Administered 2013-01-02 – 2013-01-03 (×2): 5 mg via ORAL
  Filled 2013-01-02 (×3): qty 1

## 2013-01-02 MED ORDER — SERTRALINE HCL 50 MG PO TABS
50.0000 mg | ORAL_TABLET | Freq: Every day | ORAL | Status: DC
  Filled 2013-01-02: qty 1

## 2013-01-02 MED ORDER — ASPIRIN EC 81 MG PO TBEC
81.0000 mg | DELAYED_RELEASE_TABLET | Freq: Every day | ORAL | Status: DC
  Administered 2013-01-03 – 2013-01-04 (×2): 81 mg via ORAL
  Filled 2013-01-02 (×2): qty 1

## 2013-01-02 MED ORDER — SERTRALINE HCL 50 MG PO TABS: 50.0000 mg | ORAL_TABLET | Freq: Every day | ORAL | Status: DC

## 2013-01-02 MED ORDER — DEXTROSE 5 % IV SOLN
1.0000 g | Freq: Once | INTRAVENOUS | Status: AC
  Administered 2013-01-02: 19:00:00 via INTRAVENOUS
  Filled 2013-01-02: qty 10

## 2013-01-02 MED ORDER — HEPARIN SODIUM (PORCINE) 5000 UNIT/ML IJ SOLN
5000.0000 [IU] | Freq: Three times a day (TID) | INTRAMUSCULAR | Status: DC
  Administered 2013-01-03 – 2013-01-04 (×4): 5000 [IU] via SUBCUTANEOUS
  Filled 2013-01-02 (×7): qty 1

## 2013-01-02 MED ORDER — PANTOPRAZOLE SODIUM 40 MG PO TBEC: 40.0000 mg | DELAYED_RELEASE_TABLET | Freq: Every day | ORAL | Status: DC

## 2013-01-02 MED ORDER — LOSARTAN POTASSIUM-HCTZ 50-12.5 MG PO TABS: 1.0000 | ORAL_TABLET | Freq: Every day | ORAL | Status: DC

## 2013-01-02 MED ORDER — GUAIFENESIN ER 600 MG PO TB12
600.0000 mg | ORAL_TABLET | Freq: Every day | ORAL | Status: DC
  Administered 2013-01-03 – 2013-01-04 (×2): 600 mg via ORAL
  Filled 2013-01-02 (×2): qty 1

## 2013-01-02 MED ORDER — HYDROCHLOROTHIAZIDE 12.5 MG PO CAPS
12.5000 mg | ORAL_CAPSULE | Freq: Every day | ORAL | Status: DC
  Filled 2013-01-02: qty 1

## 2013-01-02 MED ORDER — INSULIN ASPART 100 UNIT/ML ~~LOC~~ SOLN
5.0000 [IU] | Freq: Once | SUBCUTANEOUS | Status: AC
  Administered 2013-01-02: 5 [IU] via SUBCUTANEOUS
  Filled 2013-01-02: qty 1

## 2013-01-02 MED ORDER — PANTOPRAZOLE SODIUM 40 MG PO TBEC
40.0000 mg | DELAYED_RELEASE_TABLET | Freq: Every day | ORAL | Status: DC
  Administered 2013-01-03 – 2013-01-04 (×2): 40 mg via ORAL
  Filled 2013-01-02 (×2): qty 1

## 2013-01-02 MED ORDER — LOSARTAN POTASSIUM 50 MG PO TABS
50.0000 mg | ORAL_TABLET | Freq: Every day | ORAL | Status: DC
  Administered 2013-01-03 – 2013-01-04 (×2): 50 mg via ORAL
  Filled 2013-01-02 (×2): qty 1

## 2013-01-02 MED ORDER — ONDANSETRON HCL 4 MG PO TABS: 4.0000 mg | ORAL_TABLET | Freq: Three times a day (TID) | ORAL | Status: DC | PRN

## 2013-01-02 NOTE — ED Notes (Signed)
The pt arrived by gems with a complaint of feeling sick for one year since she had a Lobbyist.  She saw her regular doctor yesterday.  Alert no distress

## 2013-01-02 NOTE — ED Provider Notes (Signed)
History     CSN: [id number]  Arrival date & time 01/02/13  1525   First MD Initiated Contact with Patient 01/02/13 1527      Chief Complaint  Patient presents with  . Dizziness    (Consider location/radiation/quality/duration/timing/severity/associated sxs/prior treatment) Patient is a 79 y.o. female presenting with general illness. The history is provided by the patient and the spouse. No language interpreter was used.  Illness  The current episode started more than 2 weeks ago. The onset is undetermined. The problem occurs continuously. The problem has been unchanged. The problem is moderate. Nothing relieves the symptoms. Nothing aggravates the symptoms. Associated symptoms include a fever (subjective and low-grade, less than 100), nausea, cough and wheezing. Pertinent negatives include no photophobia, no abdominal pain, no constipation, no diarrhea, no vomiting, no congestion, no headaches, no rhinorrhea, no sore throat, no muscle aches, no neck pain, no rash and no eye discharge. The maximum temperature noted was less than 100.4 F. It is unknown what precipitates the cough. The cough is non-productive. Nothing relieves the cough. Nothing worsens the cough. She has been less active. She has been eating and drinking normally. Urine output has been normal. The last void occurred less than 6 hours ago. There were no sick contacts. Recently, medical care has been given by the PCP. Services received include medications given (Seen by PCP for same yesterday, PPI, Mucinex and daily antihistamine, 6 day prednisone burst, on bactrim for UTI).    Past Medical History  Diagnosis Date  . Depression   . Ejection fraction     EF 40%, echo, November, 2012  / in improved, EF 60%, echo, December, 2012  . Contrast media allergy     Patient feels poorly with contrast  . Status post AAA (abdominal aortic aneurysm) repair     Surgical repair, Dr. Hart Rochester, December 27, 2011  . Pre-syncope     vasovagal w/  heart block  . Parotid mass     has refused further eval 10/2011  . GERD (gastroesophageal reflux disease)   . Anxiety   . Facial tic     L sided spasms - Botox trial summer 2013, ?effective  . Macular degeneration   . AAA (abdominal aortic aneurysm)     s/p repair 12/2011  . Congestive heart failure     NL EF 10/2011 echo   . Diabetes mellitus type II, controlled   . Hypothyroidism   . Sinus bradycardia 09/19/2011    Occurring simultaneously with complete heart block   . Hypertension     Past Surgical History  Procedure Laterality Date  . Cholecystectomy    . Knee cartilage surgery      left  . Sinus surgery with instatrak    . Appendectomy    . Oophorectomy      ovarian cyst  . Cataract extraction      bilateral  . Cardiac catheterization  11/04/11  . Bladder repair    . US echocardiography  10/14/11  . Abdominal aortic aneurysm repair  12/27/2011    Procedure: ANEURYSM ABDOMINAL AORTIC REPAIR;  Surgeon: Josephina Gip, MD;  Location: [facility] OR;  Service: Vascular;  Laterality: N/A;  Resection and Grafting Abdominal Aortic Aneurysm , Aorta Bi Iliac.    Family History  Problem Relation Age of Onset  . Esophageal cancer Father   . Breast cancer Sister   . Colon cancer Sister   . Heart disease Sister   . Heart disease Mother     History  Substance Use Topics  . Smoking status: Former Smoker -- 1.00 packs/day for 30 years    Types: Cigarettes    Quit date: 08/31/2011  . Smokeless tobacco: Never Used  . Alcohol Use: No    OB History   Grav Para Term Preterm Abortions TAB SAB Ect Mult Living                  Review of Systems  Constitutional: Positive for fever (subjective and low-grade, less than 100) and activity change. Negative for chills, diaphoresis, appetite change and fatigue.  HENT: Negative for congestion, sore throat, facial swelling, rhinorrhea, neck pain and neck stiffness.   Eyes: Negative for photophobia and discharge.  Respiratory: Positive for  cough, shortness of breath and wheezing. Negative for chest tightness.   Cardiovascular: Negative for chest pain, palpitations and leg swelling.  Gastrointestinal: Positive for nausea. Negative for vomiting, abdominal pain, diarrhea and constipation.  Endocrine: Negative for polydipsia and polyuria.  Genitourinary: Negative for dysuria, frequency, difficulty urinating and pelvic pain.  Musculoskeletal: Negative for back pain and arthralgias.  Skin: Negative for color change, rash and wound.  Allergic/Immunologic: Negative for immunocompromised state.  Neurological: Positive for weakness (generalized). Negative for facial asymmetry, numbness and headaches.  Hematological: Does not bruise/bleed easily.  Psychiatric/Behavioral: Negative for confusion and agitation.    Allergies  Contrast media; Iohexol; Penicillins; Sulfa antibiotics; Avelox; and Ciprofloxacin  Home Medications   No current outpatient prescriptions on file.  BP 152/86  Pulse 88  Temp(Src) 98.6 F (37 C) (Oral)  Resp 18  SpO2 100%  Physical Exam  Constitutional: She is oriented to person, place, and time. She appears well-developed and well-nourished. No distress.  HENT:  Head: Normocephalic and atraumatic.  Mouth/Throat: No oropharyngeal exudate.  Hoarse voice  Eyes: Pupils are equal, round, and reactive to light.  Neck: Normal range of motion. Neck supple.  Cardiovascular: Normal rate, regular rhythm and normal heart sounds.  Exam reveals no gallop and no friction rub.   No murmur heard. Pulmonary/Chest: Effort normal and breath sounds normal. No respiratory distress. She has no wheezes. She has no rales.  Abdominal: Soft. Bowel sounds are normal. She exhibits no distension and no mass. There is no tenderness. There is no rebound and no guarding.  Musculoskeletal: Normal range of motion. She exhibits no edema and no tenderness.  Neurological: She is alert and oriented to person, place, and time.  Skin: Skin is  warm and dry.  Psychiatric: She has a normal mood and affect.    ED Course  Procedures (including critical care time)  Labs Reviewed  URINALYSIS, ROUTINE W REFLEX MICROSCOPIC - Abnormal; Notable for the following:    APPearance CLOUDY (*)    Glucose, UA 500 (*)    Hgb urine dipstick SMALL (*)    All other components within normal limits  PRO B NATRIURETIC PEPTIDE - Abnormal; Notable for the following:    Pro B Natriuretic peptide (BNP) 725.1 (*)    All other components within normal limits  GLUCOSE, CAPILLARY - Abnormal; Notable for the following:    Glucose-Capillary 257 (*)    All other components within normal limits  CBC - Abnormal; Notable for the following:    WBC 12.0 (*)    RBC 3.45 (*)    Hemoglobin 11.7 (*)    HCT 34.6 (*)    MCV 100.3 (*)    All other components within normal limits  CREATININE, SERUM - Abnormal; Notable for the following:  GFR calc non Af Amer 58 (*)    GFR calc Af Amer 68 (*)    All other components within normal limits  GLUCOSE, CAPILLARY - Abnormal; Notable for the following:    Glucose-Capillary 153 (*)    All other components within normal limits  POCT I-STAT, CHEM 8 - Abnormal; Notable for the following:    Glucose, Bld 314 (*)    Hemoglobin 11.6 (*)    HCT 34.0 (*)    All other components within normal limits  URINE CULTURE  URINE MICROSCOPIC-ADD ON  TROPONIN I  BASIC METABOLIC PANEL  TROPONIN I  TROPONIN I   Dg Chest 2 View  01/02/2013  *RADIOLOGY REPORT*  Clinical Data: Shortness of breath.  CHEST - 2 VIEW  Comparison: Chest x-ray 11/28/2012.  Findings: There are ill-defined bibasilar opacities which may in part relate to areas of subsegmental atelectasis. Small bilateral pleural effusions.  Pulmonary venous congestion with mild indistinctness of the interstitial markings suggesting mild interstitial pulmonary edema.  Mild cardiomegaly.  Atherosclerosis of the thoracic aorta.  IMPRESSION: 1.  The appearance of the chest, as above,  is suggestive of mild congestive heart failure. 2.  Bibasilar subsegmental atelectasis and small bilateral pleural effusions. 3.  Atherosclerosis.   Original Report Authenticated By: Trudie Reed, M.D.      1. Hypertension   2. Congestive heart failure   3. COPD (chronic obstructive pulmonary disease) with emphysema   4. Diabetes mellitus type II, controlled       MDM  Pt is a 79 y.o. female with pertinent PMHX of AAA repair, GERD, anxiety, DM, HTN, CHF who presents with complaint of feeling "ill" for one year since around the time of her AAA repair. She has occasional upper airway "wheezing", occasional SOB, and chronic cough for at least a year.  Denies CP, ab pain vom, d/a, dysuria, frequency, poor appetite, leg swelling.   She has been seen by pulm for chronic lower lobe loculaion with plan for 6mon f/u CT.  Pt seen by PCP for same yesterday, placed on PPI, mucinex, zyrtec for chonic cough, as well as 6 day prednisone burst.  She reports she presented today because she still feels bad, had episode of SOB, lightheadedness at rest today.  Otherwise, no acute changes in symptoms. On PE, pt in NAD, lungs clear, ab exam benign, no LE edema.  Ddx includes metabolic derangement, anemia, PNA, CHF, deconditioning & exercise intolerance.   CBC, BMP, BNP, UA, istat trop, CXR ordered.   BNP in 700's, CXR c/w mild CHF.  IV rocephin given as pt has been on ABx for UTI.  IV lasix also given. Glucose elevated, likely due to prednisone.  Medicine consulted for admission for CHF exacerbation.   1. Hypertension   2. Congestive heart failure   3. COPD (chronic obstructive pulmonary disease) with emphysema   4. Diabetes mellitus type II, controlled      Labs and imaging considered in decision making, reviewed by myself.  Imaging interpreted by radiology. Pt care discussed with my attending, Dr. Rosalia Hammers.       Toy Cookey, MD 01/03/13 [phone]

## 2013-01-02 NOTE — ED Notes (Signed)
Calling report now. 

## 2013-01-02 NOTE — ED Notes (Signed)
Dr Ray at bedside. 

## 2013-01-02 NOTE — ED Notes (Signed)
Received beside report from [place], Charity fundraiser.  Patient currently resting quietly in bed; no respiratory or acute distress noted.  Patient updated on plan of care; informed patient that we are currently waiting on further orders from EDP.  Patient denies any needs at this time; will continue to monitor.

## 2013-01-02 NOTE — ED Notes (Signed)
Patient currently sitting up in bed; no respiratory or acute distress noted.  Patient requesting bedside commode after receiving lasix; patient given bedside commode.  Updated patient on plan of care; informed patient that EDP has made consult to hospitalist.  Patient denies any other needs at this time; will continue to monitor.

## 2013-01-02 NOTE — ED Provider Notes (Signed)
I saw and evaluated the patient, reviewed the resident's note and I agree with the findings and plan.  79 y.o. Female who has not felt well for some time but felt worse today with some wheezing and difficulty breathing.  She was seen in her pmd office yesterday and started abx for uti.  WDWN NAD Lungs with some decreased air movement cv rrr  Patient with some increased marking on cxr and elevated bnp.  Plan observation for mild diuresis.        Hilario Quarry, MD 01/02/13 2039

## 2013-01-02 NOTE — ED Notes (Signed)
Hospitalist currently at bedside speaking with patient about plan of care for admission.

## 2013-01-02 NOTE — ED Notes (Signed)
Report given to [place], Charity fundraiser.  No further questions/concerns from RN.  Informed RN that he can call back with any questions/concerns once patient arrives to floor.  Preparing patient for transport.

## 2013-01-02 NOTE — H&P (Signed)
[facility] History and Physical  Paula [name] ZOX:[id number] DOB: 1927-02-28 DOA: 01/02/2013  Referring physician: ED PCP: Rene Paci, MD  Specialists: Dr. Myrtis Ser cards  Chief Complaint: SOB  HPI: Paula [name] is a 79 y.o. female who presents with c/o SOB for past 2 weeks, there has been associated non-productive cough, wheezing, nausea, low grade fever.  Saw her PCP yesterday (2/21) who put her on TMP and elected to continue prednisone (which she had been on as a burst taper from 1/28 to 2/17 been on for a couple of weeks).  Unfortunately her symptoms continued to be bad so she came to the ED today.  In the ED, CXR demonstrated findings c/w mild CHF, she also was hyperglycemic, remainder of her workup has been largely unremarkable.  Hospitalist has been asked to admit.  Review of Systems: 12 systems reviewed and otherwise negative.  Past Medical History  Diagnosis Date  . Depression   . Ejection fraction     EF 40%, echo, November, 2012  / in improved, EF 60%, echo, December, 2012  . Contrast media allergy     Paula [name] feels poorly with contrast  . Status post AAA (abdominal aortic aneurysm) repair     Surgical repair, Dr. Hart Rochester, December 27, 2011  . Pre-syncope     vasovagal w/ heart block  . Parotid mass     has refused further eval 10/2011  . GERD (gastroesophageal reflux disease)   . Anxiety   . Facial tic     L sided spasms - Botox trial summer 2013, ?effective  . Macular degeneration   . AAA (abdominal aortic aneurysm)     s/p repair 12/2011  . Congestive heart failure     NL EF 10/2011 echo   . Diabetes mellitus type II, controlled   . Hypothyroidism   . Sinus bradycardia 09/19/2011    Occurring simultaneously with complete heart block   . Hypertension    Past Surgical History  Procedure Laterality Date  . Cholecystectomy    . Knee cartilage surgery      left  . Sinus surgery with instatrak    . Appendectomy    . Oophorectomy      ovarian cyst  .  Cataract extraction      bilateral  . Cardiac catheterization  11/04/11  . Bladder repair    . US echocardiography  10/14/11  . Abdominal aortic aneurysm repair  12/27/2011    Procedure: ANEURYSM ABDOMINAL AORTIC REPAIR;  Surgeon: Josephina Gip, MD;  Location: [facility] OR;  Service: Vascular;  Laterality: N/A;  Resection and Grafting Abdominal Aortic Aneurysm , Aorta Bi Iliac.   Social History:  reports that she quit smoking about 16 months ago. Her smoking use included Cigarettes. She has a 30 pack-year smoking history. She has never used smokeless tobacco. She reports that she does not drink alcohol or use illicit drugs.   Allergies  Allergen Reactions  . Contrast Media (Iodinated Diagnostic Agents) Anaphylaxis  . Iohexol Anaphylaxis, Shortness Of Breath and Swelling     Desc: PT STATES SHE HAD A SEVERE REACTION TO IV CONRAST WITH THROAT SWELLING AND SOB. SHE WAS ADMITTED TO THE HOSPITAL. SHE HAS NEVER HAD CONTRAST AGAIN.   Marland Kitchen Penicillins Rash  . Sulfa Antibiotics Anaphylaxis  . Avelox (Moxifloxacin Hcl In Nacl) Rash  . Ciprofloxacin Itching    Family History  Problem Relation Age of Onset  . Esophageal cancer Father   . Breast cancer Sister   . Colon cancer  Sister   . Heart disease Sister   . Heart disease Mother     Prior to Admission medications   Medication Sig Start Date End Date Taking? Authorizing Provider  albuterol (PROVENTIL HFA;VENTOLIN HFA) 108 (90 BASE) MCG/ACT inhaler Inhale 2 puffs into the lungs every 6 (six) hours as needed for wheezing. 09/01/12  Yes Leroy Sea, MD  ALPRAZolam Prudy Feeler) 0.5 MG tablet Take 0.5 mg by mouth 3 (three) times daily.    Yes Historical Provider, MD  aspirin EC 81 MG tablet Take 81 mg by mouth daily.   Yes Historical Provider, MD  baclofen (LIORESAL) 10 MG tablet Take 0.5 tablets (5 mg total) by mouth at bedtime. 12/28/12  Yes Newt Lukes, MD  CALCIUM-VITAMIN D PO Take 1 tablet by mouth every other day.    Yes Historical Provider, MD   cetirizine (ZYRTEC) 10 MG tablet Take 1 tablet (10 mg total) by mouth daily. 01/01/13  Yes Newt Lukes, MD  guaiFENesin (MUCINEX) 600 MG 12 hr tablet Take 600 mg by mouth daily.    Yes Historical Provider, MD  levothyroxine (SYNTHROID, LEVOTHROID) 175 MCG tablet Take 175 mcg by mouth daily.   Yes Historical Provider, MD  losartan-hydrochlorothiazide (HYZAAR) 50-12.5 MG per tablet Take 1 tablet by mouth daily. 10/28/12  Yes Newt Lukes, MD  meloxicam (MOBIC) 7.5 MG tablet Take 1 tablet (7.5 mg total) by mouth daily. 12/28/12  Yes Newt Lukes, MD  ondansetron (ZOFRAN) 4 MG tablet Take 1 tablet (4 mg total) by mouth every 8 (eight) hours as needed for nausea. 11/10/12  Yes Newt Lukes, MD  pantoprazole (PROTONIX) 40 MG tablet Take 1 tablet (40 mg total) by mouth daily. 12/28/12  Yes Newt Lukes, MD  predniSONE (STERAPRED UNI-PAK) 10 MG tablet Take 1 tablet (10 mg total) by mouth as directed. As directed x 6 days 01/01/13  Yes Newt Lukes, MD  sertraline (ZOLOFT) 50 MG tablet Take 1 tablet (50 mg total) by mouth daily. 11/09/12  Yes Newt Lukes, MD  trimethoprim (TRIMPEX) 100 MG tablet Take 1 tablet (100 mg total) by mouth 2 (two) times daily. 12/30/12  Yes Newt Lukes, MD   Physical Exam: Filed Vitals:   01/02/13 1638 01/02/13 1654 01/02/13 1834 01/02/13 2007  BP: 147/85 156/77 156/83 147/81  Pulse: 105  93 90  Temp:    98.1 F (36.7 C)  TempSrc:    Oral  Resp: 18 27 18 16   SpO2: 97% 96% 95% 96%    General:  NAD, resting comfortably in bed Eyes: PEERLA EOMI ENT: mucous membranes moist Neck: supple w/o JVD Cardiovascular: RRR w/o MRG Respiratory: CTA B, accessory mucle use is noted, no wheezes Abdomen: soft, nt, nd, bs+ Skin: no rash nor lesion Musculoskeletal: MAE, full ROM all 4 extremities Psychiatric: normal tone and affect Neurologic: AAOx3, grossly non-focal  Labs on Admission:  Basic Metabolic Panel:  Recent Labs Lab  12/28/12 1221 01/02/13 1645  NA 139 142  K 3.3* 4.1  CL 101 109  CO2 29  --   GLUCOSE 152* 314*  BUN 17 9  CREATININE 0.9 0.80  CALCIUM 9.2  --    Liver Function Tests: No results found for this basename: AST, ALT, ALKPHOS, BILITOT, PROT, ALBUMIN,  in the last 168 hours No results found for this basename: LIPASE, AMYLASE,  in the last 168 hours No results found for this basename: AMMONIA,  in the last 168 hours CBC:  Recent Labs  Lab 12/28/12 1221 01/02/13 1645  WBC 12.5*  --   NEUTROABS 8.5*  --   HGB 11.5* 11.6*  HCT 34.3* 34.0*  MCV 99.8  --   PLT 243.0  --    Cardiac Enzymes: No results found for this basename: CKTOTAL, CKMB, CKMBINDEX, TROPONINI,  in the last 168 hours  BNP (last 3 results)  Recent Labs  01/02/13 1628  PROBNP 725.1*   CBG:  Recent Labs Lab 01/02/13 1817  GLUCAP 257*    Radiological Exams on Admission: Dg Chest 2 View  01/02/2013  *RADIOLOGY REPORT*  Clinical Data: Shortness of breath.  CHEST - 2 VIEW  Comparison: Chest x-ray 11/28/2012.  Findings: There are ill-defined bibasilar opacities which may in part relate to areas of subsegmental atelectasis. Small bilateral pleural effusions.  Pulmonary venous congestion with mild indistinctness of the interstitial markings suggesting mild interstitial pulmonary edema.  Mild cardiomegaly.  Atherosclerosis of the thoracic aorta.  IMPRESSION: 1.  The appearance of the chest, as above, is suggestive of mild congestive heart failure. 2.  Bibasilar subsegmental atelectasis and small bilateral pleural effusions. 3.  Atherosclerosis.   Original Report Authenticated By: Trudie Reed, M.D.     EKG: Independently reviewed.  Assessment/Plan Active Problems:   COPD (chronic obstructive pulmonary disease) with emphysema   Diabetes mellitus type II, controlled   Congestive heart failure   Hypertension   1. CHF - unclear if systolic vs diastolic dysfunction, has had systolic HF in past but most recent  echo showed nl EF.  Repeating 2d echo, treating with lasix 40 iv q8h for diuresis, Paula [name] already on home ARB.  Check serial troponins as well, tele monitor. 2. DM2 - BGLs elevated today likely in association with prednisone, will stop further prednisone since I think her SOB is more due to CHF than due to a COPD exacerbation at this point (had been off of it since 2/17 and only had 1 dose of 10mg  today so should be ok to stop).  Will put Paula [name] on med dose SSI while inpatient. 3. COPD - chronic and appears to be stable at this point 4. HTN - continue home meds    Code Status: Full Code (must indicate code status--if unknown or must be presumed, indicate so) Family Communication: Spoke with husband at bedside (indicate person spoken with, if applicable, with phone number if by telephone) Disposition Plan: Admit to obs (indicate anticipated LOS)  Time spent: 50 min  GARDNER, JARED M. [facility] Pager [phone]  If 7PM-7AM, please contact night-coverage [email or web address] Password [facility] 01/02/2013, 9:03 PM

## 2013-01-02 NOTE — ED Notes (Signed)
MD at bedside. 

## 2013-01-03 ENCOUNTER — Inpatient Hospital Stay (HOSPITAL_COMMUNITY): Payer: Medicare Other

## 2013-01-03 DIAGNOSIS — D72829 Elevated white blood cell count, unspecified: Secondary | ICD-10-CM | POA: Diagnosis present

## 2013-01-03 DIAGNOSIS — R0989 Other specified symptoms and signs involving the circulatory and respiratory systems: Secondary | ICD-10-CM

## 2013-01-03 DIAGNOSIS — E876 Hypokalemia: Secondary | ICD-10-CM

## 2013-01-03 LAB — BASIC METABOLIC PANEL
BUN: 9 mg/dL (ref 6–23)
CO2: 29 mEq/L (ref 19–32)
Calcium: 9.3 mg/dL (ref 8.4–10.5)
Chloride: 100 mEq/L (ref 96–112)
Creatinine, Ser: 0.95 mg/dL (ref 0.50–1.10)
GFR calc Af Amer: 62 mL/min — ABNORMAL LOW (ref >90)
GFR calc non Af Amer: 54 mL/min — ABNORMAL LOW (ref >90)
Glucose, Bld: 143 mg/dL — ABNORMAL HIGH (ref 70–99)
Potassium: 3.4 mEq/L — ABNORMAL LOW (ref 3.5–5.1)
Sodium: 138 mEq/L (ref 135–145)

## 2013-01-03 LAB — MAGNESIUM: Magnesium: 2 mg/dL (ref 1.5–2.5)

## 2013-01-03 LAB — GLUCOSE, CAPILLARY
Glucose-Capillary: 159 mg/dL — ABNORMAL HIGH (ref 70–99)
Glucose-Capillary: 137 mg/dL — ABNORMAL HIGH (ref 70–99)

## 2013-01-03 LAB — TROPONIN I
Troponin I: <0.3 ng/mL (ref <0.30)
Troponin I: <0.3 ng/mL (ref <0.30)

## 2013-01-03 MED ORDER — LORAZEPAM 2 MG/ML IJ SOLN: 0.5000 mg | Freq: Once | INTRAMUSCULAR | Status: DC

## 2013-01-03 MED ORDER — POTASSIUM CHLORIDE CRYS ER 20 MEQ PO TBCR
40.0000 meq | EXTENDED_RELEASE_TABLET | Freq: Two times a day (BID) | ORAL | Status: AC
Start: 2013-01-03 — End: 2013-01-03
  Administered 2013-01-03 (×2): 40 meq via ORAL
  Filled 2013-01-03 (×2): qty 2

## 2013-01-03 NOTE — Progress Notes (Addendum)
[facility] PROGRESS NOTE Interim History: 79 y.o. female who presents with c/o SOB for past 2 weeks, there has been associated non-productive cough, wheezing, nausea, low grade fever. Saw her PCP yesterday (2/21) who put her on TMP and elected to continue prednisone (which she had been on as a burst taper from 1/28 to 2/17 been on for a couple of weeks). Unfortunately her symptoms continued to be bad so she came to the ED today.    Assessment/Plan: Dyspnea of unclear etiology: - She  minimal JVD on physical exam, her chest x-ray does show some infiltrate. She only has a mild elevation in her BNP, a 2-D echo is pending at this point. I doubt this heart failure due to the constellation of symptoms. Like I said before the patient is a very poor historian she cannot stay in one symptom as she just has a conglomerate of nonspecific symptoms that she keeps going back to mentioning even after redirect her. - Continue her Lasix and follow her creatinine. DC her hydrochlorothiazide ,Replete her potassium as she is mildly hypokalemic.  -A right and start her on steroids albuterol and Spiriva. Continue Rocephin   - Consults physical therapy.   Facial numbness: - An MRI of her head to rule out a stroke. - She's daily and aspirin 81 mg.  Hypokalemia: - Secondary to Lasix repeat check mag    Leukocytosis, unspecified: - Most likely secondary to steroids that were started as an outpatient. - She remained afebrile here in the hospital.    Hypertension - Extremely high continue her Lasix and her losartan. She's currently not on beta blocker   Diabetes mellitus type II, controlled: - Will control continue current treatment the    Code Status: Full Code  Family Communication: Spoke with husband at bedside  Disposition Plan: Admit to Inpatient  Consultants:  None  Procedures:  2-D echo 01/03/2013:  Antibiotics:  None  HPI/Subjective: Very poor historian cannot give a straight  history. Also tangential. At to be redirected to the conversation as she wants to talk about every single symptom that she's had at home. I cannot pinpoint what is bothering her. She is complaining of shortness or breath rib pain weakness nausea and vomiting some diarrhea fortunately she is not having chest pain  Objective: Filed Vitals:   01/02/13 2007 01/02/13 2145 01/02/13 2231 01/03/13 0509  BP: 147/81 155/81 152/86 144/83  Pulse: 90 89 88 79  Temp: 98.1 F (36.7 C)  98.6 F (37 C) 98.2 F (36.8 C)  TempSrc: Oral  Oral Oral  Resp: 16 20 18 18   Weight:    81 kg (178 lb 9.2 oz)  SpO2: 96% 96% 100% 98%    Intake/Output Summary (Last 24 hours) at 01/03/13 1012 Last data filed at 01/03/13 0942  Gross per 24 hour  Intake    480 ml  Output   3778 ml  Net  -3298 ml   Filed Weights   01/03/13 0509  Weight: 81 kg (178 lb 9.2 oz)    Exam:  General: Alert, awake, oriented x3, in no acute distress.  HEENT: No bruits, no goiter. Minimal JVD Heart: Regular rate and rhythm, without murmurs, rubs, gallops.  Lungs: Good air movement, clear to auscultation no wheezing or crackles. Abdomen: Soft, nontender, nondistended, positive bowel sounds.  Neuro: Grossly intact, nonfocal.   Data Reviewed: Basic Metabolic Panel:  Recent Labs Lab 12/28/12 1221 01/02/13 1645 01/02/13 2103 01/03/13 0305  NA 139 142  --  138  K 3.3* 4.1  --  3.4*  CL 101 109  --  100  CO2 29  --   --  29  GLUCOSE 152* 314*  --  143*  BUN 17 9  --  9  CREATININE 0.9 0.80 0.89 0.95  CALCIUM 9.2  --   --  9.3   Liver Function Tests: No results found for this basename: AST, ALT, ALKPHOS, BILITOT, PROT, ALBUMIN,  in the last 168 hours No results found for this basename: LIPASE, AMYLASE,  in the last 168 hours No results found for this basename: AMMONIA,  in the last 168 hours CBC:  Recent Labs Lab 12/28/12 1221 01/02/13 1645 01/02/13 2104  WBC 12.5*  --  12.0*  NEUTROABS 8.5*  --   --   HGB 11.5*  11.6* 11.7*  HCT 34.3* 34.0* 34.6*  MCV 99.8  --  100.3*  PLT 243.0  --  287   Cardiac Enzymes:  Recent Labs Lab 01/02/13 2103 01/03/13 0305  TROPONINI <0.30 <0.30   BNP (last 3 results)  Recent Labs  01/02/13 1628  PROBNP 725.1*   CBG:  Recent Labs Lab 01/02/13 1817 01/02/13 2202 01/03/13 0621  GLUCAP 257* 153* 159*    No results found for this or any previous visit (from the past 240 hour(s)).   Studies: Dg Chest 2 View  01/02/2013  *RADIOLOGY REPORT*  Clinical Data: Shortness of breath.  CHEST - 2 VIEW  Comparison: Chest x-ray 11/28/2012.  Findings: There are ill-defined bibasilar opacities which may in part relate to areas of subsegmental atelectasis. Small bilateral pleural effusions.  Pulmonary venous congestion with mild indistinctness of the interstitial markings suggesting mild interstitial pulmonary edema.  Mild cardiomegaly.  Atherosclerosis of the thoracic aorta.  IMPRESSION: 1.  The appearance of the chest, as above, is suggestive of mild congestive heart failure. 2.  Bibasilar subsegmental atelectasis and small bilateral pleural effusions. 3.  Atherosclerosis.   Original Report Authenticated By: Trudie Reed, M.D.     Scheduled Meds: . ALPRAZolam  0.5 mg Oral TID  . aspirin EC  81 mg Oral Daily  . baclofen  5 mg Oral QHS  . furosemide  40 mg Intravenous Q8H  . guaiFENesin  600 mg Oral Daily  . heparin  5,000 Units Subcutaneous Q8H  . hydrochlorothiazide  12.5 mg Oral Daily  . insulin aspart  0-15 Units Subcutaneous TID WC  . insulin aspart  5 Units Subcutaneous Once  . levothyroxine  175 mcg Oral QAC breakfast  . loratadine  10 mg Oral Daily  . losartan  50 mg Oral Daily  . meloxicam  7.5 mg Oral Daily  . pantoprazole  40 mg Oral Daily  . sertraline  50 mg Oral Daily  . sodium chloride  3 mL Intravenous Q12H   Continuous Infusions:    Marinda Elk  [facility] Pager [phone]. If 8PM-8AM, please contact night-coverage at  [email or web address], password [facility] 01/03/2013, 10:12 AM  LOS: 1 day

## 2013-01-03 NOTE — ED Provider Notes (Signed)
  I performed a history and physical examination of Paula [name] and discussed her management with Dr. Micheline Maze .  I agree with the history, physical, assessment, and plan of care, with the following exceptions: None  I was present for the following procedures: None Time Spent in Critical Care of the patient: None Time spent in discussions with the patient and family: 39  Jeree Delcid S  Elderly female with vague symptoms of not feeling well for extended period of time.  She has some increased dyspnea and increased markings on cxr with increased bnp.  Plan admission for gentle diuresis and further evaluation.   Hilario Quarry, MD 01/03/13 [phone]

## 2013-01-03 NOTE — Progress Notes (Signed)
  Echocardiogram 2D Echocardiogram has been performed.  Paula [name] FRANCES 01/03/2013, 12:34 PM

## 2013-01-04 DIAGNOSIS — I509 Heart failure, unspecified: Secondary | ICD-10-CM

## 2013-01-04 DIAGNOSIS — J438 Other emphysema: Secondary | ICD-10-CM

## 2013-01-04 DIAGNOSIS — I10 Essential (primary) hypertension: Secondary | ICD-10-CM

## 2013-01-04 LAB — GLUCOSE, CAPILLARY
Glucose-Capillary: 150 mg/dL — ABNORMAL HIGH (ref 70–99)
Glucose-Capillary: 131 mg/dL — ABNORMAL HIGH (ref 70–99)

## 2013-01-04 LAB — BASIC METABOLIC PANEL
BUN: 14 mg/dL (ref 6–23)
CO2: 30 mEq/L (ref 19–32)
Calcium: 9.1 mg/dL (ref 8.4–10.5)
Chloride: 104 mEq/L (ref 96–112)
Creatinine, Ser: 1.08 mg/dL (ref 0.50–1.10)
Glucose, Bld: 145 mg/dL — ABNORMAL HIGH (ref 70–99)

## 2013-01-04 LAB — URINE CULTURE

## 2013-01-04 NOTE — Progress Notes (Signed)
DC IV, DC Tele, DC Home. Discharge instructions and home medications discussed with patient and patient's husband. Patient and family denied any questions or concerns at this time. Patient leaving unit via wheelchair and appears in no acute distress.

## 2013-01-04 NOTE — Progress Notes (Signed)
Pt refused to do CPT Flutter at this time, said that she will do it later.

## 2013-01-04 NOTE — Evaluation (Addendum)
Physical Therapy Evaluation Patient Details Name: Paula [name] MRN: [id number] DOB: [birth year] Today's Date: 01/04/2013 Time: 9562-1308 PT Time Calculation (min): 23 min  PT Assessment / Plan / Recommendation Clinical Impression  79 y.o female adm. with sob. Presents to PT at supervision/modified independent level with mobility. Will have assist from husband. Pt does not want HHPT because she feels they are nosy and annoying. Educated pt on option of OPPT for progression of her activity tolerance and balance but pt reports she wants to think about it. No further acute PT needs at this time. Pt aware to contact her PCP if she would like to pursue OPPT.     PT Assessment  Patent does not need any further PT services    Follow Up Recommendations  No PT follow up; Supervision for OOB/mobility    Does the patient have the potential to tolerate intense rehabilitation      Barriers to Discharge        Equipment Recommendations  None recommended by PT    Recommendations for Other Services     Frequency      Precautions / Restrictions Precautions Precautions: None Restrictions Weight Bearing Restrictions: No   Pertinent Vitals/Pain Denies pain      Mobility  Bed Mobility Bed Mobility: Supine to Sit Supine to Sit: 6: Modified independent (Device/Increase time);HOB elevated (20 degrees) Transfers Transfers: Sit to Stand;Stand to Sit Sit to Stand: 5: Supervision;With upper extremity assist;From bed Stand to Sit: 6: Modified independent (Device/Increase time);With upper extremity assist;To bed Details for Transfer Assistance: cues for safety using RW (pt pulling on RW to stand) Ambulation/Gait Ambulation/Gait Assistance: 5: Supervision Ambulation Distance (Feet): 220 Feet Assistive device: Rolling walker Ambulation/Gait Assistance Details: supervision initially as she tends to get distracted but not unstable with RW or demonstrating unsafe behavior, good ability to maneuver with  RW Gait Pattern: Within Functional Limits Gait velocity: decreased Stairs: Yes Stairs Assistance: 5: Supervision Stairs Assistance Details (indicate cue type and reason): supervision for safety Stair Management Technique: One rail Left Number of Stairs: 2        Visit Information  Last PT Received On: 01/04/13 Assistance Needed: +1    Subjective Data  Subjective: Those physical therapist that came into my home got on my nerves.  Patient Stated Goal: home   Prior Functioning  Home Living Lives With: Spouse Available Help at Discharge: Family Type of Home: House Home Access: Stairs to enter Secretary/administrator of Steps: 1 Entrance Stairs-Rails: Left Home Layout: One level Bathroom Shower/Tub: Engineer, manufacturing systems: Standard Home Adaptive Equipment: Environmental consultant - rolling Additional Comments: husband helps her with bathing Prior Function Level of Independence: Needs assistance Needs Assistance: Bathing;Dressing Bath: Minimal (sponge baths) Dressing: Minimal Able to Take Stairs?: Yes Driving: No Vocation: Retired Musician: No difficulties    Copywriter, advertising Overall Cognitive Status: Impaired Area of Impairment: Attention;Safety/judgement Arousal/Alertness: Awake/alert Orientation Level: Appears intact for tasks assessed Behavior During Session: [facility] for tasks performed Current Attention Level: Alternating;Selective Safety/Judgement: Impulsive Cognition - Other Comments: impulsive with steps    Extremity/Trunk Assessment Right Upper Extremity Assessment RUE ROM/Strength/Tone: [facility] for tasks assessed Left Upper Extremity Assessment LUE ROM/Strength/Tone: WFL for tasks assessed Right Lower Extremity Assessment RLE ROM/Strength/Tone: WFL for tasks assessed Left Lower Extremity Assessment LLE ROM/Strength/Tone: WFL for tasks assessed Trunk Assessment Trunk Assessment: Normal   Balance    End of Session PT - End of  Session Equipment Utilized During Treatment: Gait belt Activity Tolerance: Patient tolerated treatment well  Patient left: in bed (sitting EOB) Nurse Communication: Mobility status  GP     [facility] [name] 01/04/2013, 9:45 AM

## 2013-01-04 NOTE — Discharge Summary (Addendum)
Physician Discharge Summary  Paula [name] OZH:[id number] DOB: [birth year] DOA: 01/02/2013  PCP: Rene Paci, MD  Admit date: 01/02/2013 Discharge date: 01/04/2013  Time spent: 30 minutes  Recommendations for Outpatient Follow-up:  1. Hospital follow up 2 weeks evaluate respiration, will Need PFt's.   Discharge Diagnoses:  Active Problems:   COPD (chronic obstructive pulmonary disease) with emphysema   Congestive heart failure   Diabetes mellitus type II, controlled   Hypertension   Hypokalemia   Leukocytosis, unspecified   Dyspnea and respiratory abnormality   Discharge Condition: stable  Diet recommendation:Carbmodified diet  Filed Weights   01/03/13 0509 01/04/13 0430  Weight: 81 kg (178 lb 9.2 oz) 79.3 kg (174 lb 13.2 oz)    History of present illness:  79 y.o. female who presents with c/o SOB for past 2 weeks, there has been associated non-productive cough, wheezing, nausea, low grade fever. Saw her PCP yesterday (2/21) who put her on TMP and elected to continue prednisone (which she had been on as a burst taper from 1/28 to 2/17 been on for a couple of weeks). Unfortunately her symptoms continued to be bad so she came to the ED today.  In the ED, CXR demonstrated findings c/w mild CHF, she also was hyperglycemic, remainder of her workup has been largely unremarkable. Hospitalist has been asked to admit.   Hospital Course:  Dyspnea of unclear etiology:  - She minimal JVD on physical exam, her chest x-ray does show some infiltrate. She only has a mild elevation in her BNP, a 2-D echo55% to 60%. Although no diagnostic regional wall motion abnormality was identified,  Left ventricular diastolic function parameters were normal. - Got one dose of lasix and her creatinine remained stable. DC her hydrochlorothiazide and is  to be restarted as an outpatient. - Replete her potassium as she is mildly hypokalemic.  - Start her on steroids albuterol and Spiriva.  Facial  numbness:  - An MRI of her head no new stroke. - She's daily and aspirin 81 mg.   Hypokalemia:  - Secondary to Lasix repeat check mag   Leukocytosis, unspecified:  - Most likely secondary to steroids that were started as an outpatient.  - She remained afebrile here in the hospital.   Hypertension  - Extremely high continue her Lasix and her losartan. She's currently not on beta blocker . - Back to her home meds, HCTZ and losartan as an outpatient.  Diabetes mellitus type II, controlled:  - Will control continue current treatment.   Procedures:  MRI 2.23.201: no acute infarct  ECHO: ef 55% no diatolic HF.  Consultations:  none  Discharge Exam: Filed Vitals:   01/03/13 1605 01/03/13 2030 01/04/13 0134 01/04/13 0430  BP: 147/80 142/64 136/69 138/69  Pulse: 96 83 82 86  Temp: 97.1 F (36.2 C) 97.9 F (36.6 C) 97.9 F (36.6 C) 97.8 F (36.6 C)  TempSrc: Oral Oral Oral Oral  Resp: 19 20 20 20   Weight:    79.3 kg (174 lb 13.2 oz)  SpO2: 95% 95% 94% 96%    General: A&O x3 Cardiovascular: RRR Respiratory: good air movement CTA B/L  Discharge Instructions  Discharge Orders   Future Appointments Provider Department Dept Phone   05/17/2013 10:00 AM [facility] [address] [phone]   Patient to arrive 15 minutes prior to appointment time. No solid food 4 hours prior to exam. Liquids and Medicines are okay.   Future Orders Complete By Expires  Diet - low sodium heart healthy  As directed     Increase activity slowly  As directed         Medication List    TAKE these medications       albuterol 108 (90 BASE) MCG/ACT inhaler  Commonly known as:  PROVENTIL HFA;VENTOLIN HFA  Inhale 2 puffs into the lungs every 6 (six) hours as needed for wheezing.     ALPRAZolam 0.5 MG tablet  Commonly known as:  XANAX  Take 0.5 mg by mouth 3 (three) times daily.     aspirin EC 81 MG tablet  Take 81 mg by mouth daily.     baclofen 10 MG  tablet  Commonly known as:  LIORESAL  Take 0.5 tablets (5 mg total) by mouth at bedtime.     CALCIUM-VITAMIN D PO  Take 1 tablet by mouth every other day.     cetirizine 10 MG tablet  Commonly known as:  ZYRTEC  Take 1 tablet (10 mg total) by mouth daily.     guaiFENesin 600 MG 12 hr tablet  Commonly known as:  MUCINEX  Take 600 mg by mouth daily.     levothyroxine 175 MCG tablet  Commonly known as:  SYNTHROID, LEVOTHROID  Take 175 mcg by mouth daily.     losartan-hydrochlorothiazide 50-12.5 MG per tablet  Commonly known as:  HYZAAR  Take 1 tablet by mouth daily.     meloxicam 7.5 MG tablet  Commonly known as:  MOBIC  Take 1 tablet (7.5 mg total) by mouth daily.     ondansetron 4 MG tablet  Commonly known as:  ZOFRAN  Take 1 tablet (4 mg total) by mouth every 8 (eight) hours as needed for nausea.     pantoprazole 40 MG tablet  Commonly known as:  PROTONIX  Take 1 tablet (40 mg total) by mouth daily.     predniSONE 10 MG tablet  Commonly known as:  STERAPRED UNI-PAK  Take 1 tablet (10 mg total) by mouth as directed. As directed x 6 days     sertraline 50 MG tablet  Commonly known as:  ZOLOFT  Take 1 tablet (50 mg total) by mouth daily.     trimethoprim 100 MG tablet  Commonly known as:  TRIMPEX  Take 1 tablet (100 mg total) by mouth 2 (two) times daily.           Follow-up Information   Follow up with [facility], MD In 2 weeks. (hospital follow up)    Contact information:   [address], [address] [address] [facility] [address] [phone]        The results of significant diagnostics from this hospitalization (including imaging, microbiology, ancillary and laboratory) are listed below for reference.    Significant Diagnostic Studies: Dg Chest 2 View  01/02/2013  *RADIOLOGY REPORT*  Clinical Data: Shortness of breath.  CHEST - 2 VIEW  Comparison: Chest x-ray 11/28/2012.  Findings: There are ill-defined bibasilar  opacities which may in part relate to areas of subsegmental atelectasis. Small bilateral pleural effusions.  Pulmonary venous congestion with mild indistinctness of the interstitial markings suggesting mild interstitial pulmonary edema.  Mild cardiomegaly.  Atherosclerosis of the thoracic aorta.  IMPRESSION: 1.  The appearance of the chest, as above, is suggestive of mild congestive heart failure. 2.  Bibasilar subsegmental atelectasis and small bilateral pleural effusions. 3.  Atherosclerosis.   Original Report Authenticated By: Trudie Reed, M.D.    Mr Brain  Wo Contrast  01/03/2013  *RADIOLOGY REPORT*  Clinical Data: Numbness of the face.  MRI HEAD WITHOUT CONTRAST  Technique:  Multiplanar, multiecho pulse sequences of the brain and surrounding structures were obtained according to standard protocol without intravenous contrast.  Comparison: Head CT 08/01/2012.  MRI 09/18/2011.  Findings: Diffusion imaging does not show any acute or subacute infarction.  There are mild chronic small vessel changes within the pons.  No cerebellar insult.  The cerebral hemispheres show confluent changes of chronic small vessel disease throughout the deep and subcortical white matter.  No cortical or large vessel territory infarction.  No mass lesion, hemorrhage, hydrocephalus or extra-axial collection.  No pituitary mass.  There are mild mucosal inflammatory changes in the maxillary sinus.  No skull or skull base lesion.  Major vessels are patent at the base of the brain. Right parotid abnormality not apparently changed on the coronal imaging, but not completely imaged.  Stability since at least 2012 suggest benign nature.  IMPRESSION: No acute infarction.  Extensive chronic small vessel changes throughout the brain as outlined above.   Original Report Authenticated By: Paulina Fusi, M.D.     Microbiology: Recent Results (from the past 240 hour(s))  URINE CULTURE     Status: None   Collection Time    01/02/13  5:11 PM       Result Value Range Status   Specimen Description URINE, CLEAN CATCH   Final   Special Requests NONE   Final   Culture  Setup Time 01/03/2013 01:20   Final   Colony Count 3,000 COLONIES/ML   Final   Culture INSIGNIFICANT GROWTH   Final   Report Status 01/04/2013 FINAL   Final     Labs: Basic Metabolic Panel:  Recent Labs Lab 12/28/12 1221 01/02/13 1645 01/02/13 2103 01/03/13 0305 01/03/13 1030 01/04/13 0513  NA 139 142  --  138  --  141  K 3.3* 4.1  --  3.4*  --  3.9  CL 101 109  --  100  --  104  CO2 29  --   --  29  --  30  GLUCOSE 152* 314*  --  143*  --  145*  BUN 17 9  --  9  --  14  CREATININE 0.9 0.80 0.89 0.95  --  1.08  CALCIUM 9.2  --   --  9.3  --  9.1  MG  --   --   --   --  2.0  --    Liver Function Tests: No results found for this basename: AST, ALT, ALKPHOS, BILITOT, PROT, ALBUMIN,  in the last 168 hours No results found for this basename: LIPASE, AMYLASE,  in the last 168 hours No results found for this basename: AMMONIA,  in the last 168 hours CBC:  Recent Labs Lab 12/28/12 1221 01/02/13 1645 01/02/13 2104  WBC 12.5*  --  12.0*  NEUTROABS 8.5*  --   --   HGB 11.5* 11.6* 11.7*  HCT 34.3* 34.0* 34.6*  MCV 99.8  --  100.3*  PLT 243.0  --  287   Cardiac Enzymes:  Recent Labs Lab 01/02/13 2103 01/03/13 0305 01/03/13 0930  TROPONINI <0.30 <0.30 <0.30   BNP: BNP (last 3 results)  Recent Labs  01/02/13 1628  PROBNP 725.1*   CBG:  Recent Labs Lab 01/03/13 0621 01/03/13 1134 01/03/13 1603 01/03/13 2123 01/04/13 0644  GLUCAP 159* 138* 137* 150* 147*       Signed:  FELIZ  Rosine Beat  [facility] 01/04/2013, 8:50 AM

## 2013-01-07 ENCOUNTER — Ambulatory Visit (INDEPENDENT_AMBULATORY_CARE_PROVIDER_SITE_OTHER): Payer: Medicare Other | Admitting: Internal Medicine

## 2013-01-07 ENCOUNTER — Encounter: Payer: Self-pay | Admitting: Internal Medicine

## 2013-01-07 VITALS — BP 122/82 | HR 101 | Temp 98.2°F | Wt 177.0 lb

## 2013-01-07 DIAGNOSIS — R05 Cough: Secondary | ICD-10-CM

## 2013-01-07 MED ORDER — BENZONATATE 200 MG PO CAPS: 200.0000 mg | ORAL_CAPSULE | Freq: Three times a day (TID) | ORAL | Status: DC | PRN

## 2013-01-07 MED ORDER — HYDROCODONE-HOMATROPINE 5-1.5 MG/5ML PO SYRP: 5.0000 mL | ORAL_SOLUTION | Freq: Three times a day (TID) | ORAL | Status: DC | PRN

## 2013-01-07 NOTE — Progress Notes (Signed)
  Subjective:    Patient ID: Paula [name], female    DOB: Mar 19, 1927, 79 y.o.   MRN: [id number]  HPI  Complains of continued cough Interval history reviewed including 2 recent office visits and overnight hospitalization February 22- 24th for same   Past Medical History  Diagnosis Date  . Depression   . Ejection fraction     EF 40%, echo, November, 2012  / in improved, EF 60%, echo, December, 2012  . Contrast media allergy     Patient feels poorly with contrast  . Status post AAA (abdominal aortic aneurysm) repair     Surgical repair, Dr. Hart [place], December 27, 2011  . Pre-syncope     vasovagal w/ heart block  . Parotid mass     has refused further eval 10/2011  . GERD (gastroesophageal reflux disease)   . Anxiety   . Facial tic     L sided spasms - Botox trial summer 2013, ?effective  . Macular degeneration   . AAA (abdominal aortic aneurysm)     s/p repair 12/2011  . Congestive heart failure     NL EF 10/2011 echo   . Diabetes mellitus type II, controlled   . Hypothyroidism   . Sinus bradycardia 09/19/2011    Occurring simultaneously with complete heart block   . Hypertension      Review of Systems  HENT: Positive for postnasal drip. Negative for sneezing.   Respiratory: Positive for cough, chest tightness and shortness of breath.   Cardiovascular: Negative for chest pain, palpitations and leg swelling.  Psychiatric/Behavioral: Negative for sleep disturbance and dysphoric mood.       Objective:   Physical Exam BP 122/82  Pulse 101  Temp(Src) 98.2 F (36.8 C) (Oral)  Wt 177 lb (80.287 kg)  BMI 29.45 kg/m2  SpO2 96% Gen: NAD, hoarse without change. Spouse at side Lungs: CTA B without wheeze or crackle, diminished air movement at bases CV: RRR, no edema B  CT chest 11/02/2012 reviewed as well as chest x-ray January 18 and February 22 Lab Results  Component Value Date   WBC 12.0* 01/02/2013   HGB 11.7* 01/02/2013   HCT 34.6* 01/02/2013   PLT 287 01/02/2013   GLUCOSE 145* 01/04/2013   CHOL 144 11/04/2011   TRIG 129 11/04/2011   HDL 38* 11/04/2011   LDLCALC 80 11/04/2011   ALT 12 08/31/2012   AST 16 08/31/2012   NA 141 01/04/2013   K 3.9 01/04/2013   CL 104 01/04/2013   CREATININE 1.08 01/04/2013   BUN 14 01/04/2013   CO2 30 01/04/2013   TSH 0.80 12/28/2012   INR 1.06 08/24/2012   HGBA1C 6.5 10/28/2012       Assessment & Plan:   Cough - chronic - suspect chronic bronchitis No evidence for infection or CHF Recent eval OV and IP/ER visit reviewed  Continue pulm meds and cough suppression -add scheduled Tessalon with scheduled Hydromet  Time spent with pt/family today 25 minutes, greater than 50% time spent counseling patient oncough symptoms and medication review. Also review of prior records

## 2013-01-07 NOTE — Assessment & Plan Note (Signed)
Pulmonary evaluation for same early January 2014 reviewed Chronically loculated effusion left lower lobe as noted on CT chest December 2013, present since postop chest x-ray October 2013 Planning followup CT in 6 months (July 2014) completed prednisone taper x6 days without apparent change in cough symptoms Continue albuterol inhaler as prescribed with daily Mucinex and antihistamine plus PPI

## 2013-01-07 NOTE — Assessment & Plan Note (Signed)
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 To continue following with neurology as scheduled

## 2013-01-07 NOTE — Assessment & Plan Note (Signed)
Significant component of anxiety detected on exam, likely contributing to shortness of breath symptoms Review and reassurance provided to patient and spouse today with apparently little impact Patient declines emotional component of current symptoms, declines increase in sertraline dose Support provided at length today including review of her "near fatal" hospitalization and surgery fall 2013

## 2013-01-07 NOTE — Patient Instructions (Signed)
It was good to see you today. We have reviewed your prior records including hospitalization, labs and tests today Medications reviewed Take zyrtec, protonix AND Mucinex everyday to help control your "upper" wheeze Take Tessalon 3 times a day with Hydromet cough syrup 3 times a day every day no other medication changes at this time. Your prescription(s) have been submitted to your pharmacy. Please take as directed and contact our office if you believe you are having problem(s) with the medication(s). Keep followup with pulmonary specialist as planned Please schedule followup in 3-4 months, call sooner if problems.

## 2013-01-14 ENCOUNTER — Telehealth: Payer: Self-pay | Admitting: Internal Medicine

## 2013-01-14 NOTE — Telephone Encounter (Signed)
I will make refer as requested

## 2013-01-14 NOTE — Telephone Encounter (Signed)
Notified pt with md response.../lmb 

## 2013-01-14 NOTE — Telephone Encounter (Signed)
Pt"s spoyse called to req referral for urology (Dr. Porfirio Mylar Dohmeier [phone]). Pt was in the hospital on 01/02/13 and release 01/09/13. Pt's spouse stated that the doctor from hospital suggest for pt to go see above doctor. Please call pt if this is ok.

## 2013-01-25 ENCOUNTER — Telehealth: Payer: Self-pay | Admitting: Internal Medicine

## 2013-01-25 NOTE — Telephone Encounter (Signed)
Patient Information:  Caller Name: Karen Kitchens  Phone: [phone]  Patient: Paula [name]  Gender: Female  DOB: 1927/04/17  Age: 79 Years  PCP: Rene Paci (Adults only)  Office Follow Up:  Does the office need to follow up with this patient?: No  Instructions For The Office: N/A  RN Note:  several days of nausea and no vomiting  she gets some relief with her nausea medicine she has.   She has mild abdominal pain but not consistent  No other sxs  Symptoms  Reason For Call & Symptoms: nausea  low abdominal pain  Reviewed Health History In EMR: Yes  Reviewed Medications In EMR: Yes  Reviewed Allergies In EMR: Yes  Reviewed Surgeries / Procedures: Yes  Date of Onset of Symptoms: 01/22/2013  Guideline(s) Used:  No Protocol Available - Sick Adult  Disposition Per Guideline:   Home Care  Reason For Disposition Reached:   Patient's symptoms are safe to treat at home per nursing judgment  Advice Given:  N/A  Patient Will Follow Care Advice:  YES

## 2013-01-27 ENCOUNTER — Encounter: Payer: Self-pay | Admitting: Internal Medicine

## 2013-01-27 ENCOUNTER — Ambulatory Visit (INDEPENDENT_AMBULATORY_CARE_PROVIDER_SITE_OTHER): Payer: Medicare Other | Admitting: Internal Medicine

## 2013-01-27 VITALS — BP 132/82 | HR 94 | Temp 98.3°F | Wt 182.8 lb

## 2013-01-27 DIAGNOSIS — J441 Chronic obstructive pulmonary disease with (acute) exacerbation: Secondary | ICD-10-CM

## 2013-01-27 MED ORDER — HYDROCODONE-HOMATROPINE 5-1.5 MG/5ML PO SYRP: 5.0000 mL | ORAL_SOLUTION | Freq: Every day | ORAL | Status: DC

## 2013-01-27 MED ORDER — BENZONATATE 200 MG PO CAPS: 200.0000 mg | ORAL_CAPSULE | Freq: Two times a day (BID) | ORAL | Status: DC

## 2013-01-27 MED ORDER — IPRATROPIUM-ALBUTEROL 20-100 MCG/ACT IN AERS: 1.0000 | INHALATION_SPRAY | Freq: Four times a day (QID) | RESPIRATORY_TRACT | Status: DC

## 2013-01-27 NOTE — Patient Instructions (Signed)
It was good to see you today. We have reviewed your prior records including hospitalization, labs and tests today Medications reviewed and updated Take zyrtec, protonix AND Mucinex everyday to help control your  wheeze Take Tessalon 2 times a day with Hydromet cough syrup at bedtime every day start Combivent Fosamax inhaler every 6 hours throughout the day - (NOT as needed!) Your prescription(s) have been submitted to your pharmacy. Please take as directed and contact our office if you believe you are having problem(s) with the medication(s). Keep followup with pulmonary specialist as planned Please schedule followup in 3-4 months, call sooner if problems.

## 2013-01-27 NOTE — Progress Notes (Signed)
Subjective:    Patient ID: Paula [name], female    DOB: Jan 23, 1927, 79 y.o.   MRN: [id number]  HPI  Complains of continued cough associated with wheeze Interval history reviewed including 3 recent office visits and overnight hospitalization February 22- 24th for same  Also has several other medical questions related to recent home visit from [facility] RN   Past Medical History  Diagnosis Date  . Depression   . Ejection fraction     EF 40%, echo, November, 2012  / in improved, EF 60%, echo, December, 2012  . Contrast media allergy     Patient feels poorly with contrast  . Status post AAA (abdominal aortic aneurysm) repair     Surgical repair, Dr. Hart Rochester, December 27, 2011  . Pre-syncope     vasovagal w/ heart block  . Parotid mass     has refused further eval 10/2011  . GERD (gastroesophageal reflux disease)   . Anxiety   . Facial tic     L sided spasms - Botox trial summer 2013, ?effective  . Macular degeneration   . AAA (abdominal aortic aneurysm)     s/p repair 12/2011  . Congestive heart failure     NL EF 10/2011 echo   . Diabetes mellitus type II, controlled   . Hypothyroidism   . Sinus bradycardia 09/19/2011    Occurring simultaneously with complete heart block   . Hypertension      Review of Systems  HENT: Positive for postnasal drip. Negative for sneezing.   Respiratory: Positive for cough, chest tightness, shortness of breath and wheezing.   Cardiovascular: Negative for chest pain, palpitations and leg swelling.  Psychiatric/Behavioral: Negative for sleep disturbance and dysphoric mood.       Objective:   Physical Exam  BP 132/82  Pulse 94  Temp(Src) 98.3 F (36.8 C) (Oral)  Wt 182 lb 12.8 oz (82.918 kg)  BMI 30.42 kg/m2  SpO2 92% Wt Readings from Last 3 Encounters:  01/27/13 182 lb 12.8 oz (82.918 kg)  01/07/13 177 lb (80.287 kg)  01/04/13 174 lb 13.2 oz (79.3 kg)   Gen: NAD, hoarse without change. Spouse at side Lungs: CTA B with  soft end exp wheeze (upper>lower) - no crackles; diminished air movement at bases CV: RRR, no edema BLE  CT chest 11/02/2012 reviewed as well as chest x-ray January 18 and February 22 Lab Results  Component Value Date   WBC 12.0* 01/02/2013   HGB 11.7* 01/02/2013   HCT 34.6* 01/02/2013   PLT 287 01/02/2013   GLUCOSE 145* 01/04/2013   CHOL 144 11/04/2011   TRIG 129 11/04/2011   HDL 38* 11/04/2011   LDLCALC 80 11/04/2011   ALT 12 08/31/2012   AST 16 08/31/2012   NA 141 01/04/2013   K 3.9 01/04/2013   CL 104 01/04/2013   CREATININE 1.08 01/04/2013   BUN 14 01/04/2013   CO2 30 01/04/2013   TSH 0.80 12/28/2012   INR 1.06 08/24/2012   HGBA1C 6.5 10/28/2012       Assessment & Plan:  chronic bronchitis -Mild acute exacerbation with chronic cough symptoms Complicated by medical compliance/confusion and lack of belief in medication efficacy driving noncompliance Also macular degeneration so depends on spouse to supply her meds No evidence for infection or CHF on exam Recent eval OVs and IP/ER visit 12/2012 reviewed  Continue pulm meds and cough suppression - reviewed recommended schedule today Add combivent QID - new erx done  Time spent with pt/family today  25 minutes, greater than 50% time spent counseling patient oncough symptoms and medication review. Also review of prior records

## 2013-01-29 ENCOUNTER — Telehealth: Payer: Self-pay | Admitting: Critical Care Medicine

## 2013-01-29 ENCOUNTER — Encounter: Payer: Self-pay | Admitting: Internal Medicine

## 2013-01-29 ENCOUNTER — Telehealth: Payer: Self-pay | Admitting: Internal Medicine

## 2013-01-29 ENCOUNTER — Ambulatory Visit (INDEPENDENT_AMBULATORY_CARE_PROVIDER_SITE_OTHER): Payer: Medicare Other | Admitting: Internal Medicine

## 2013-01-29 VITALS — BP 142/82 | HR 98 | Temp 98.8°F | Wt 184.0 lb

## 2013-01-29 DIAGNOSIS — R05 Cough: Secondary | ICD-10-CM

## 2013-01-29 NOTE — Telephone Encounter (Signed)
Patient Information:  Caller Name: [name]  Phone: [phone]  Patient: Paula [name]  Gender: Female  DOB: [birth year]  Age: 79 Years  PCP: Rene Paci (Adults only)  Office Follow Up:  Does the office need to follow up with this patient?: Yes  Instructions For The Office: just seen 2 days ago doesn't want to come back in.  her inhaler is q6 is that one she could use q4 and what else would you recommended  I reviewed home care advice  RN Note:  was seen 2 days ago and he said the inhaler was changed  He said she is having some wheezing, not continuous and said the treatment will sometimes relieve it but not always same way with her cough it is a dry hacky cough and sometimes relieved with inhaler but not always  No fever   was talking to me in background and I did not pick up on any shortness of breath.   She doesn't want to come back to doctor or go to hospital  they want to know what else they can do  reviewed home care with them  Symptoms  Reason For Call & Symptoms: wheezing  Reviewed Health History In EMR: Yes  Reviewed Medications In EMR: Yes  Reviewed Allergies In EMR: Yes  Reviewed Surgeries / Procedures: Yes  Date of Onset of Symptoms: 01/27/2013  Guideline(s) Used:  Asthma Attack  Disposition Per Guideline:   See Today or Tomorrow in Office  Reason For Disposition Reached:   Mild asthma attack (e.g., no SOB at rest, mild SOB with walking, speaks normally in sentences, mild wheezing) and persists > 24 hours on appropriate treatment  Advice Given:  Humidifier:   If the air is dry, use a cool mist humidifier to prevent drying of the upper airway.  Drinking Liquids:  Try to drink normal amount of liquids (e.g., water). Being adequately hydrated makes it easier to cough up the sticky lung mucus.  Call Back If:  You become worse.  RN Overrode Recommendation:  Follow Up With Office Later  they want to know what else she can do

## 2013-01-29 NOTE — Patient Instructions (Signed)
It was good to see you today. We have reviewed your prior records including hospitalization, labs and tests today Medications reviewed and updated Keep followup with pulmonary specialist as planned Please schedule followup in 3-4 months, call sooner if problems.

## 2013-01-29 NOTE — Telephone Encounter (Signed)
Patient Information:  Caller Name: [name]  Phone: [phone]  Patient: Scarlette Ar  Gender: Female  DOB: 10-29-1927  Age: 79 Years  PCP: Rene Paci (Adults only)  Office Follow Up:  Does the office need to follow up with this patient?: No  Instructions For The Office: N/A   Symptoms  Reason For Call & Symptoms: seen in the office 01/27/13 for acute asthmatic bronchitis; says she has been wheezing for several days and sick for 18mos; told to continue meds and add Combivent, Tessalon and Hycodan; says she sees NO improvement in her sxs;  Reviewed Health History In EMR: Yes  Reviewed Medications In EMR: Yes  Reviewed Allergies In EMR: Yes  Reviewed Surgeries / Procedures: Yes  Date of Onset of Symptoms: Unknown  Guideline(s) Used:  Cough  Asthma Attack  Disposition Per Guideline:   See Today or Tomorrow in Office  Reason For Disposition Reached:   Mild asthma attack (e.g., no SOB at rest, mild SOB with walking, speaks normally in sentences, mild wheezing) and persists > 24 hours on appropriate treatment  Advice Given:  N/A  Patient Will Follow Care Advice:  YES  Appointment Scheduled:  01/29/2013 14:15:00 Appointment Scheduled Provider:  Rene Paci (Adults only)

## 2013-01-29 NOTE — Telephone Encounter (Signed)
Pt called and wants a OV soon with Alva. Pls schedule

## 2013-01-29 NOTE — Progress Notes (Signed)
Subjective:    Patient ID: Paula [name], female    DOB: Jan 31, 1927, 79 y.o.   MRN: [id number]  HPI  Complains of continued cough associated with wheeze Interval history reviewed including numerous recent office visits and overnight hospitalization 12/2012 for same  Also has several other medical questions   Past Medical History  Diagnosis Date  . Depression   . Ejection fraction     EF 40%, echo, November, 2012  / in improved, EF 60%, echo, December, 2012  . Contrast media allergy     Patient feels poorly with contrast  . Status post AAA (abdominal aortic aneurysm) repair     Surgical repair, Dr. Hart Rochester, December 27, 2011  . Pre-syncope     vasovagal w/ heart block  . Parotid mass     has refused further eval 10/2011  . GERD (gastroesophageal reflux disease)   . Anxiety   . Facial tic     L sided spasms - Botox trial summer 2013, ?effective  . Macular degeneration   . AAA (abdominal aortic aneurysm)     s/p repair 12/2011  . Congestive heart failure     NL EF 10/2011 echo   . Diabetes mellitus type II, controlled   . Hypothyroidism   . Sinus bradycardia 09/19/2011    Occurring simultaneously with complete heart block   . Hypertension      Review of Systems  HENT: Positive for postnasal drip. Negative for sneezing.   Respiratory: Positive for cough, chest tightness, shortness of breath and wheezing.   Cardiovascular: Negative for chest pain, palpitations and leg swelling.  Psychiatric/Behavioral: Negative for sleep disturbance and dysphoric mood.       Objective:   Physical Exam  BP 142/82  Pulse 98  Temp(Src) 98.8 F (37.1 C) (Oral)  Wt 184 lb (83.462 kg)  BMI 30.62 kg/m2  SpO2 92% Wt Readings from Last 3 Encounters:  01/29/13 184 lb (83.462 kg)  01/27/13 182 lb 12.8 oz (82.918 kg)  01/07/13 177 lb (80.287 kg)   Gen: NAD, nontoxic. hoarse without change. Spouse at side Lungs: CTA B with soft end exp wheeze (upper>lower) - no crackles; diminished air  movement at bases CV: RRR, no edema BLE  CT chest 11/02/2012 reviewed as well as chest x-ray January 18 and February 22 Lab Results  Component Value Date   WBC 12.0* 01/02/2013   HGB 11.7* 01/02/2013   HCT 34.6* 01/02/2013   PLT 287 01/02/2013   GLUCOSE 145* 01/04/2013   CHOL 144 11/04/2011   TRIG 129 11/04/2011   HDL 38* 11/04/2011   LDLCALC 80 11/04/2011   ALT 12 08/31/2012   AST 16 08/31/2012   NA 141 01/04/2013   K 3.9 01/04/2013   CL 104 01/04/2013   CREATININE 1.08 01/04/2013   BUN 14 01/04/2013   CO2 30 01/04/2013   TSH 0.80 12/28/2012   INR 1.06 08/24/2012   HGBA1C 6.5 10/28/2012   11/02/12 ct chest w/o CM IMPRESSION:   1.  Loculated left pleural effusion with suspected rounded atelectasis in the left lower lobe.  No specific findings for intrathoracic malignancy identified.  If neoplasm is suspected, thoracentesis should be considered.  Otherwise, follow-up chest CT in 50-month suggested to assess stability. 2.  Evidence of prior granulomatous disease with calcified left lower lobe granuloma, calcified left hilar lymph nodes and calcified granulomas in the liver and spleen. 3.  Diffuse atherosclerosis. 4.  Cannot exclude a mesenteric mass inferior to the pancreatic tail, incompletely visualized by  this examination of the chest. Further evaluation with abdominal CT to include at least oral contrast recommended.  11/10/12 CT a/p w/CM IMPRESSION:   1.  The recently demonstrated mass in the mid abdomen is well circumscribed with low density and is inferior to the pancreatic body and tail.  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 2.  Interval successful repair of the abdominal aortic aneurysm. 3.  Stable postoperative appearance of the anterior abdominal wall with small left inguinal hernia containing only fat. 4.  Stable  loculated left pleural effusion and suspected left lower lobe rounded atelectasis compared with recent chest CT.     CXR 01/02/13: IMPRESSION: 1.  The appearance of the chest, as above, is suggestive of mild congestive heart failure. 2.  Bibasilar subsegmental atelectasis and small bilateral pleural effusions. 3.  Atherosclerosis.       Assessment & Plan:  chronic bronchitis - chronic cough symptoms Complicated by medical compliance/confusion and lack of belief in medication efficacy driving noncompliance (?mild dementia) Also macular degeneration so depends on spouse to supply her meds No evidence for infection or CHF on exam Recent eval OVs and IP/ER visit 12/2012 reviewed; also imaging  Continue pulm meds and cough suppression - reviewed recommended schedule today Also recommended to continue combivent QID -   The patient is reassured that these symptoms do not appear to represent a serious or threatening condition.

## 2013-02-01 ENCOUNTER — Encounter: Payer: Self-pay | Admitting: Internal Medicine

## 2013-02-01 ENCOUNTER — Ambulatory Visit (INDEPENDENT_AMBULATORY_CARE_PROVIDER_SITE_OTHER): Payer: Medicare Other | Admitting: Internal Medicine

## 2013-02-01 VITALS — BP 122/82 | HR 99 | Temp 98.3°F | Wt 182.8 lb

## 2013-02-01 DIAGNOSIS — R059 Cough, unspecified: Secondary | ICD-10-CM

## 2013-02-01 DIAGNOSIS — R05 Cough: Secondary | ICD-10-CM

## 2013-02-01 NOTE — Telephone Encounter (Signed)
I spoke with pt. D/t her scheduled she is scheduled to come in 02/05/13 at 11:30. Nothing further was needed

## 2013-02-01 NOTE — Patient Instructions (Signed)
It was good to see you today. We have reviewed your prior records today Medications reviewed and updated - no changes recommended today Keep followup with pulmonary specialist as planned (Tammy)

## 2013-02-01 NOTE — Telephone Encounter (Signed)
I spoke with pt. She stated she has been having a lot "bronchial" problems/ c/o dry cough, wheezing, chest tx. Pt states she quit smoking x several weeks ago. Per pt these symptoms have been going on for a while now. Per pt she does not feel bad. Do you want me to overbook and bring her in to see you or TP. Please advise thanks

## 2013-02-01 NOTE — Telephone Encounter (Signed)
Pl arrange with TP

## 2013-02-01 NOTE — Progress Notes (Signed)
Subjective:    Patient ID: Paula [name], female    DOB: May 14, 1927, 79 y.o.   MRN: [id number]  HPI   Complains of continued cough - improves with meds  "but i think she's got pneumonia every AM when she wakes up" says spouse who then schedules OV for same Interval history reviewed including numerous recent office visits and overnight hospitalization 12/2012 for same   Past Medical History  Diagnosis Date  . Depression   . Ejection fraction     EF 40%, echo, November, 2012  / in improved, EF 60%, echo, December, 2012  . Contrast media allergy     Patient feels poorly with contrast  . Status post AAA (abdominal aortic aneurysm) repair     Surgical repair, Dr. Hart Rochester, December 27, 2011  . Pre-syncope     vasovagal w/ heart block  . Parotid mass     has refused further eval 10/2011  . GERD (gastroesophageal reflux disease)   . Anxiety   . Facial tic     L sided spasms - Botox trial summer 2013, ?effective  . Macular degeneration   . AAA (abdominal aortic aneurysm)     s/p repair 12/2011  . Congestive heart failure     NL EF 10/2011 echo   . Diabetes mellitus type II, controlled   . Hypothyroidism   . Sinus bradycardia 09/19/2011    Occurring simultaneously with complete heart block   . Hypertension      Review of Systems  HENT: Positive for postnasal drip. Negative for sneezing.   Respiratory: Positive for cough, chest tightness, shortness of breath and wheezing.   Cardiovascular: Negative for chest pain, palpitations and leg swelling.  Psychiatric/Behavioral: Negative for sleep disturbance and dysphoric mood.       Objective:   Physical Exam  BP 122/82  Pulse 99  Temp(Src) 98.3 F (36.8 C) (Oral)  Wt 182 lb 12.8 oz (82.918 kg)  BMI 30.42 kg/m2  SpO2 91% Wt Readings from Last 3 Encounters:  02/01/13 182 lb 12.8 oz (82.918 kg)  01/29/13 184 lb (83.462 kg)  01/27/13 182 lb 12.8 oz (82.918 kg)   Gen: NAD, nontoxic. hoarse without change. Spouse at side Lungs:  CTA B - no wheeze or crackles; diminished air movement at bases CV: RRR, no edema BLE   CT chest 11/02/2012 reviewed as well as chest x-ray January 18 and February 22  Lab Results  Component Value Date   WBC 12.0* 01/02/2013   HGB 11.7* 01/02/2013   HCT 34.6* 01/02/2013   PLT 287 01/02/2013   GLUCOSE 145* 01/04/2013   CHOL 144 11/04/2011   TRIG 129 11/04/2011   HDL 38* 11/04/2011   LDLCALC 80 11/04/2011   ALT 12 08/31/2012   AST 16 08/31/2012   NA 141 01/04/2013   K 3.9 01/04/2013   CL 104 01/04/2013   CREATININE 1.08 01/04/2013   BUN 14 01/04/2013   CO2 30 01/04/2013   TSH 0.80 12/28/2012   INR 1.06 08/24/2012   HGBA1C 6.5 10/28/2012   11/02/12 ct chest w/o CM IMPRESSION:   1.  Loculated left pleural effusion with suspected rounded atelectasis in the left lower lobe.  No specific findings for intrathoracic malignancy identified.  If neoplasm is suspected, thoracentesis should be considered.  Otherwise, follow-up chest CT in 46-month suggested to assess stability. 2.  Evidence of prior granulomatous disease with calcified left lower lobe granuloma, calcified left hilar lymph nodes and calcified granulomas in the liver and spleen. 3.  Diffuse atherosclerosis. 4.  Cannot exclude a mesenteric mass inferior to the pancreatic tail, incompletely visualized by this examination of the chest. Further evaluation with abdominal CT to include at least oral contrast recommended.  11/10/12 CT a/p w/CM IMPRESSION:   1.  The recently demonstrated mass in the mid abdomen is well circumscribed with low density and is inferior to the pancreatic body and tail.  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 2.  Interval successful repair of the abdominal aortic aneurysm. 3.  Stable postoperative appearance of the anterior abdominal wall with small left  inguinal hernia containing only fat. 4.  Stable loculated left pleural effusion and suspected left lower lobe rounded atelectasis compared with recent chest CT.     CXR 01/02/13: IMPRESSION: 1.  The appearance of the chest, as above, is suggestive of mild congestive heart failure. 2.  Bibasilar subsegmental atelectasis and small bilateral pleural effusions. 3.  Atherosclerosis.       Assessment & Plan:  chronic bronchitis - chronic cough symptoms Complicated by medical compliance/confusion and lack of belief in medication efficacy driving noncompliance (?mild dementia of spouse>patient) Also macular degeneration so depends on spouse to supply her meds No evidence for infection or CHF on exam Recent eval OVs and IP/ER visit 12/2012 reviewed; also imaging  Continue pulm meds and cough suppression - reviewed recommended schedule today Also recommended to continue combivent QID -   The patient is reassured that these symptoms do not appear to represent a serious or threatening condition.

## 2013-02-05 ENCOUNTER — Encounter: Payer: Self-pay | Admitting: Adult Health

## 2013-02-05 ENCOUNTER — Ambulatory Visit (INDEPENDENT_AMBULATORY_CARE_PROVIDER_SITE_OTHER): Payer: Medicare Other | Admitting: Adult Health

## 2013-02-05 VITALS — BP 140/82 | HR 106 | Temp 98.0°F | Ht 65.0 in | Wt 186.2 lb

## 2013-02-05 DIAGNOSIS — J438 Other emphysema: Secondary | ICD-10-CM

## 2013-02-05 DIAGNOSIS — J439 Emphysema, unspecified: Secondary | ICD-10-CM

## 2013-02-05 NOTE — Patient Instructions (Addendum)
Continue on Combivent 1 puff Four times a day   Delsym 2 tsp Twice daily  As needed  Cough  follow up Dr. Vassie Loll  In 6 weeks with PFT  Please contact office for sooner follow up if symptoms do not improve or worsen or seek emergency care

## 2013-02-05 NOTE — Progress Notes (Signed)
Subjective:    Patient ID: Paula [name], female    DOB: 09-02-1927, 79 y.o.   MRN: [id number]  HPI 79 year old ex smoker ( quit 10/12) as is for evaluation of abnormal imaging study. She has changed primary care physician from Dr. Nicholos Johns to Dr. Felicity Coyer She is reported as having Recurrent PNA - 2 episodes during 2013  -She now has no residual cough or shortness of breath, no productive sputum . She underwent urgent surgery for abdominal aortic aneurysm by Dr. Hart Rochester in 2/13. Although she has recovered, she still ambulates with a walker. CT scan of the chest 12/13 showed Loculated left pleural effusion with suspected rounded atelectasis in the left lower lobe.  Evidence of prior granulomatous disease with calcified left lower lobe granuloma, calcified left hilar lymph nodes and calcified granulomas in the liver and spleen.  CT abdomen clarified a cystic mass in the abdomen, likely a pancreatic pseudocyst. Review of her imaging study shows left lower lobe airspace disease dating back to 10/13. Of note, she is allergic to contrast media and had a reaction with neck swelling requiring steroids. ENT evaluation by Dr. Pollyann Kennedy for hoarseness has been nondiagnostic >repeat CT in 6 mon   02/05/2013 Acute OV  Pt complains of 1 year of intermittent coughing, minimal congestion, dsypnea. Says she thought this would get better after she stopped smoking but it has persisted.  No PFT were found in chart. She carries the dx of COPD and is on Combivent Four times a day  . She also uses ventolin  most days. No hemoptysis , edema, chest pain , palpitations or orthopnea. No fever or discolored mucus  Smoked for >50 yrs , quit 2012 .  Last cxr 01/02/13 showed bilateral small pleural effusions, mild interstitial pulm edema.    Past Medical History  Diagnosis Date  . Depression   . Ejection fraction     EF 40%, echo, November, 2012  / in improved, EF 60%, echo, December, 2012  . Contrast media allergy    Patient feels poorly with contrast  . Status post AAA (abdominal aortic aneurysm) repair     Surgical repair, Dr. Hart Rochester, December 27, 2011  . Pre-syncope     vasovagal w/ heart block  . Parotid mass     has refused further eval 10/2011  . GERD (gastroesophageal reflux disease)   . Anxiety   . Facial tic     L sided spasms - Botox trial summer 2013, ?effective  . Macular degeneration   . AAA (abdominal aortic aneurysm)     s/p repair 12/2011  . Congestive heart failure     NL EF 10/2011 echo   . Diabetes mellitus type II, controlled   . Hypothyroidism   . Sinus bradycardia 09/19/2011    Occurring simultaneously with complete heart block   . Hypertension     Past Surgical History  Procedure Date  . Cholecystectomy   . Knee cartilage surgery     left  . Sinus surgery with instatrak   . Appendectomy   . Oophorectomy     ovarian cyst  . Cataract extraction     bilateral  . Cardiac catheterization 11/04/11  . Bladder repair   . US echocardiography 10/14/11  . Abdominal aortic aneurysm repair 12/27/2011    Procedure: ANEURYSM ABDOMINAL AORTIC REPAIR;  Surgeon: Josephina Gip, MD;  Location: [facility];  Service: Vascular;  Laterality: N/A;  Resection and Grafting Abdominal Aortic Aneurysm , Aorta Bi Iliac.    Allergies  Allergen Reactions  . Contrast Media (Iodinated Diagnostic Agents) Anaphylaxis  . Iohexol Shortness Of Breath and Swelling     Desc: PT STATES SHE HAD A SEVERE REACTION TO IV CONRAST WITH THROAT SWELLING AND SOB. SHE WAS ADMITTED TO THE HOSPITAL. SHE HAS NEVER HAD CONTRAST AGAIN.   Marland Kitchen Penicillins Rash  . Sulfa Antibiotics Anaphylaxis  . Avelox (Moxifloxacin Hcl In Nacl) Rash  . Baclofen     unknown    History   Social History  . Marital Status: Married    Spouse Name: N/A    Number of Children: 4  . Years of Education: N/A   Occupational History  . retired    Social History Main Topics  . Smoking status: Former Smoker -- 1.0 packs/day for 30 years     Types: Cigarettes    Quit date: 08/31/2011  . Smokeless tobacco: Never Used  . Alcohol Use: No  . Drug Use: No  . Sexually Active: Not Currently    Birth Control/ Protection: Post-menopausal   Other Topics Concern  . Not on file   Social History Narrative  . No narrative on file       Review of Systems  Constitutional: Negative for appetite change and unexpected weight change.  HENT:  Negative for sore throat, sneezing and dental problem.   Respiratory: Positive for shortness of breath. Negative for cough.   Cardiovascular: Negative for chest pain, palpitations and leg swelling.  Gastrointestinal: Positive for bloating  Musculoskeletal: Positive for arthralgias. Negative for joint swelling.  Skin: Negative for rash.  Neurological: Negative for headaches.  Psychiatric/Behavioral: Negative for dysphoric mood. The patient is nervous/anxious.        Objective:   Physical Exam  Gen. Pleasant, elderly , no distress, anxious affect ENT - no lesions, no post nasal drip Neck: No JVD, no thyromegaly, no carotid bruits Lungs: no use of accessory muscles, no dullness to percussion, clear without rales or rhonchi  Cardiovascular: Rhythm regular, heart sounds  normal, no murmurs or gallops, no peripheral edema Abdomen: soft and non-tender, no hepatosplenomegaly, BS normal. Musculoskeletal: No deformities, no cyanosis or clubbing Neuro:  alert, non focal       Assessment & Plan:

## 2013-02-05 NOTE — Assessment & Plan Note (Signed)
Hx of COPD  Would like to obtain PFT on return .   Plan  Continue on Combivent 1 puff Four times a day   Delsym 2 tsp Twice daily  As needed  Cough  follow up Dr. Vassie Loll  In 6 weeks with PFT  Please contact office for sooner follow up if symptoms do not improve or worsen or seek emergency care

## 2013-02-07 ENCOUNTER — Encounter: Payer: Self-pay | Admitting: Cardiology

## 2013-02-08 ENCOUNTER — Telehealth: Payer: Self-pay | Admitting: Internal Medicine

## 2013-02-08 ENCOUNTER — Ambulatory Visit (INDEPENDENT_AMBULATORY_CARE_PROVIDER_SITE_OTHER): Payer: Medicare Other | Admitting: Internal Medicine

## 2013-02-08 ENCOUNTER — Encounter: Payer: Self-pay | Admitting: Internal Medicine

## 2013-02-08 VITALS — BP 134/72 | HR 90 | Temp 97.7°F | Wt 188.1 lb

## 2013-02-08 DIAGNOSIS — J439 Emphysema, unspecified: Secondary | ICD-10-CM

## 2013-02-08 DIAGNOSIS — J438 Other emphysema: Secondary | ICD-10-CM

## 2013-02-08 DIAGNOSIS — I509 Heart failure, unspecified: Secondary | ICD-10-CM

## 2013-02-08 NOTE — Telephone Encounter (Signed)
[facility] Triage Call Report Triage Record Num: [id number] Operator: [place] [employer] Patient Name: Paula [name] Call Date & Time: 02/07/2013 8:32:29PM Patient Phone: [phone] PCP: Rene Paci Patient Gender: Female PCP Fax : [phone] Patient DOB: 1926-12-04 Practice Name: [facility] Reason for Call: Caller: [name]; PCP: Rene Paci (Adults only); CB#: [id number]; Call regarding Cough/Congestion with onset 01/26/13; Afebrile, night sweats, eating and drinking, urinating normally; Pt is drinking lots of water; Husband called because wife was having coughing spells, but he states that she has settled down some now; Disposition of See Provider within 24 hours d/t "New or worsening cough and known cardiac or respiratory condition" per Cough Protocol; Home care advice given and appt made for 02/08/13 @ 0830. Protocol(s) Used: Cough - Adult Recommended Outcome per Protocol: See Provider within 24 hours Reason for Outcome: New or worsening cough AND known cardiac or respiratory condition Care Advice: ~ Call provider if symptoms worsen or new symptoms develop. Call EMS 911 if sudden worsening of breathing problem, dusky or blue/gray color of lips, fingernail beds or skin; chest pain, weakness, or confusion occurs. ~ Go to ED IMMEDIATELY if developing increased shortness of breath, continuous cough, worsening fatigue, or unable to perform ADLs. ~ List, or take, all current prescription(s), nonprescription or alternative medication(s) to provider for evaluation.

## 2013-02-08 NOTE — Patient Instructions (Signed)
It was good to see you today. We have reviewed your prior records today Medications reviewed and updated - no changes recommended today Keep followup with pulmonary specialist as planned (PFTs and appointment with Dr Vassie Loll) and Dr Myrtis Ser

## 2013-02-08 NOTE — Assessment & Plan Note (Signed)
Hx transient decreased LVEF fall 2012 (40%), recovered since and has maintained normal LVEF (12/2012 echo reviewed)  No evidence for diastolic dysfunction follow up cards as planned this week - euvolemic on exam The current medical regimen is effective;  continue present plan and medications.

## 2013-02-08 NOTE — Assessment & Plan Note (Signed)
chronic bronchitis - chronic cough symptoms Complicated by medical compliance/confusion and lack of belief in medication efficacy driving noncompliance (?mild dementia of spouse AND patient) Also macular degeneration so depends on spouse to supply her meds No evidence for infection or CHF on exam (2D echo 12/2012 normal LVEF) Recent eval OVs here and pulm + IP/ER visit 12/2012 reviewed; also imaging For follow up CT chest late 04/2013 to monitor loculation as seen 10/2012 (persisting post op 08/2012)  Continue pulm meds and cough suppression - reviewed recommended schedule today Continue eval and PFTs as scheduled by pulm (last OV reviewed) Also recommended to continue combivent QID -   The patient/spouse is reassured that these symptoms do not appear to represent a serious or threatening condition. Referral to [facility] for consideration of CM services at home to help manage frequent visits (OP, ER and IP) related to same

## 2013-02-08 NOTE — Progress Notes (Signed)
Subjective:    Patient ID: Paula [name], female    DOB: [birth year], 79 y.o.   MRN: [id number]  HPI  Complains of continued cough - multiple visits here, ER and pulm for same -  Interval history reviewed including numerous recent office visits and overnight hospitalization 12/2012 for same   Past Medical History  Diagnosis Date  . Depression   . Ejection fraction     EF 40%, echo, November, 2012  / in improved, EF 60%, echo, December, 2012  . Contrast media allergy     Patient feels poorly with contrast  . Status post AAA (abdominal aortic aneurysm) repair     Surgical repair, Dr. Hart Rochester, December 27, 2011  . Pre-syncope     vasovagal w/ heart block  . Parotid mass     has refused further eval 10/2011  . GERD (gastroesophageal reflux disease)   . Anxiety   . Facial tic     L sided spasms - Botox trial summer 2013, ?effective  . Macular degeneration   . AAA (abdominal aortic aneurysm)     s/p repair 12/2011  . Congestive heart failure     NL EF 10/2011 echo   . Diabetes mellitus type II, controlled   . Hypothyroidism   . Sinus bradycardia 09/19/2011    Occurring simultaneously with complete heart block   . Hypertension      Review of Systems  HENT: Positive for postnasal drip. Negative for sneezing.   Respiratory: Positive for cough, chest tightness, shortness of breath and wheezing.   Cardiovascular: Negative for chest pain, palpitations and leg swelling.  Psychiatric/Behavioral: Negative for sleep disturbance and dysphoric mood.       Objective:   Physical Exam  BP 134/72  Pulse 90  Temp(Src) 97.7 F (36.5 C) (Oral)  Wt 188 lb 2 oz (85.333 kg)  BMI 31.31 kg/m2  SpO2 92% Wt Readings from Last 3 Encounters:  02/08/13 188 lb 2 oz (85.333 kg)  02/05/13 186 lb 3.2 oz (84.46 kg)  02/01/13 182 lb 12.8 oz (82.918 kg)   Gen: NAD, nontoxic. hoarse without change. Spouse at side Lungs: CTA B - no wheeze or crackles; diminished air movement at bases CV: RRR, no  edema BLE    Lab Results  Component Value Date   WBC 12.0* 01/02/2013   HGB 11.7* 01/02/2013   HCT 34.6* 01/02/2013   PLT 287 01/02/2013   GLUCOSE 145* 01/04/2013   CHOL 144 11/04/2011   TRIG 129 11/04/2011   HDL 38* 11/04/2011   LDLCALC 80 11/04/2011   ALT 12 08/31/2012   AST 16 08/31/2012   NA 141 01/04/2013   K 3.9 01/04/2013   CL 104 01/04/2013   CREATININE 1.08 01/04/2013   BUN 14 01/04/2013   CO2 30 01/04/2013   TSH 0.80 12/28/2012   INR 1.06 08/24/2012   HGBA1C 6.5 10/28/2012   11/02/12 ct chest w/o CM IMPRESSION:   1.  Loculated left pleural effusion with suspected rounded atelectasis in the left lower lobe.  No specific findings for intrathoracic malignancy identified.  If neoplasm is suspected, thoracentesis should be considered.  Otherwise, follow-up chest CT in 19-month suggested to assess stability. 2.  Evidence of prior granulomatous disease with calcified left lower lobe granuloma, calcified left hilar lymph nodes and calcified granulomas in the liver and spleen. 3.  Diffuse atherosclerosis. 4.  Cannot exclude a mesenteric mass inferior to the pancreatic tail, incompletely visualized by this examination of the chest. Further evaluation with abdominal  CT to include at least oral contrast recommended.  11/10/12 CT a/p w/CM IMPRESSION:   1.  The recently demonstrated mass in the mid abdomen is well circumscribed with low density and is inferior to the pancreatic body and tail.  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 2.  Interval successful repair of the abdominal aortic aneurysm. 3.  Stable postoperative appearance of the anterior abdominal wall with small left inguinal hernia containing only fat. 4.  Stable loculated left pleural effusion and suspected left lower lobe rounded atelectasis compared with recent  chest CT.     CXR 01/02/13: IMPRESSION: 1.  The appearance of the chest, as above, is suggestive of mild congestive heart failure. 2.  Bibasilar subsegmental atelectasis and small bilateral pleural effusions. 3.  Atherosclerosis.       Assessment & Plan:   See problem list. Medications and labs reviewed today.  Time spent with pt/family today 25 minutes, greater than 50% time spent counseling patient on chronic cough and medication review. Also review of interval and prior records

## 2013-02-09 ENCOUNTER — Telehealth: Payer: Self-pay | Admitting: Internal Medicine

## 2013-02-09 NOTE — Telephone Encounter (Signed)
Patient Information:  Caller Name: [name]  Phone: [phone]  Patient: Paula [name], Paula [name]  Gender: Female  DOB: 1927/07/07  Age: 79 Years  PCP: Rene Paci (Adults only)  Office Follow Up:  Does the office need to follow up with this patient?: No  Instructions For The Office: N/A  RN Note:  Cough started this afternoon around 14:00 and can't seem to stop with nothing helping. Husband is concerned about dry unrelenting coughing episodes that interfere with her rest.  Patient has had numerous office visits and ER visits.  Patient has COPD and CHF.  This episode started during nap where she woke up coughing.  Cough is dry non productive.  Denies fever.  No emergent symptoms assessed.  Husband questioned closely for difficulty breathing and while she is constantly coughing- he denied blueness, fainting, or difficulty breathing.  Wife has asked to have 911 called.  Husband has used inhaler and the less strong cough medication that has been prescribed.  During assessment wife stopped coughing.  RN discussed with husband care advice stressing good intake of fluids for hydration and help to liquify mucus and drainage to help get it up and out.  Also discussed the use of warm fluids and steam during a coughing episode.  Husband's anxiety is evident.  RN tried to offer reassurance as much as possible.  Care advice given with call demonstrating his understanding. Appointment scheduled for in the morning at 09:00 with Dr. Felicity Coyer. Demonstrated his understanding.  Symptoms  Reason For Call & Symptoms: Calls about continued cough  Reviewed Health History In EMR: Yes  Reviewed Medications In EMR: Yes  Reviewed Allergies In EMR: Yes  Reviewed Surgeries / Procedures: Yes  Date of Onset of Symptoms: 02/09/2013  Treatments Tried: Has tried cough medication and inhalers.  Helped very little  Treatments Tried Worked: No  Guideline(s) Used:  Cough  Disposition Per Guideline:   See Today or Tomorrow in  Office  Reason For Disposition Reached:   Continuous (nonstop) coughing interferes with work or school and no improvement using cough treatment per Care Advice  Advice Given:  Reassurance  You can also get a cough after being exposed to irritating substances like smoke, strong perfumes, and dust.  Cough Medicines:  Home Remedy - Honey: This old home remedy has been shown to help decrease coughing at night. The adult dosage is 2 teaspoons (10 ml) at bedtime. Honey should not be given to infants under one year of age.  Prevent Dehydration:  Drink adequate liquids.  This will help soothe an irritated or dry throat and loosen up the phlegm.  Coughing Spasms:  Drink warm fluids. Inhale warm mist (Reason: both relax the airway and loosen up the phlegm).  Suck on cough drops or hard candy to coat the irritated throat.  Call Back If:  Difficulty breathing  Cough lasts more than 3 weeks  You become worse.  Patient Will Follow Care Advice:  YES  Appointment Scheduled:  02/10/2013 09:00:00 Appointment Scheduled Provider:  Rene Paci (Adults only)

## 2013-02-10 ENCOUNTER — Ambulatory Visit (INDEPENDENT_AMBULATORY_CARE_PROVIDER_SITE_OTHER): Payer: Medicare Other | Admitting: Internal Medicine

## 2013-02-10 ENCOUNTER — Encounter: Payer: Self-pay | Admitting: Internal Medicine

## 2013-02-10 ENCOUNTER — Other Ambulatory Visit (INDEPENDENT_AMBULATORY_CARE_PROVIDER_SITE_OTHER): Payer: Medicare Other

## 2013-02-10 VITALS — BP 124/80 | HR 108 | Temp 98.1°F | Resp 16 | Wt 184.5 lb

## 2013-02-10 DIAGNOSIS — R05 Cough: Secondary | ICD-10-CM

## 2013-02-10 DIAGNOSIS — J439 Emphysema, unspecified: Secondary | ICD-10-CM

## 2013-02-10 DIAGNOSIS — F411 Generalized anxiety disorder: Secondary | ICD-10-CM

## 2013-02-10 DIAGNOSIS — E119 Type 2 diabetes mellitus without complications: Secondary | ICD-10-CM

## 2013-02-10 DIAGNOSIS — R110 Nausea: Secondary | ICD-10-CM

## 2013-02-10 DIAGNOSIS — R059 Cough, unspecified: Secondary | ICD-10-CM

## 2013-02-10 DIAGNOSIS — J438 Other emphysema: Secondary | ICD-10-CM

## 2013-02-10 DIAGNOSIS — F419 Anxiety disorder, unspecified: Secondary | ICD-10-CM

## 2013-02-10 LAB — LDL CHOLESTEROL, DIRECT: Direct LDL: 90.6 mg/dL

## 2013-02-10 LAB — BASIC METABOLIC PANEL
BUN: 10 mg/dL (ref 6–23)
Chloride: 102 mEq/L (ref 96–112)
GFR: 68.65 mL/min (ref >60.00)
Glucose, Bld: 267 mg/dL — ABNORMAL HIGH (ref 70–99)
Potassium: 3.1 mEq/L — ABNORMAL LOW (ref 3.5–5.1)
Sodium: 139 mEq/L (ref 135–145)

## 2013-02-10 LAB — LIPASE: Lipase: 24 U/L (ref 11.0–59.0)

## 2013-02-10 LAB — LIPID PANEL
HDL: 30.1 mg/dL — ABNORMAL LOW (ref >39.00)
Total CHOL/HDL Ratio: 5
Triglycerides: 218 mg/dL — ABNORMAL HIGH (ref 0.0–149.0)

## 2013-02-10 LAB — CBC WITH DIFFERENTIAL/PLATELET
Basophils Relative: 0.8 % (ref 0.0–3.0)
Eosinophils Relative: 6.2 % — ABNORMAL HIGH (ref 0.0–5.0)
Lymphocytes Relative: 16.5 % (ref 12.0–46.0)
Monocytes Absolute: 0.4 10*3/uL (ref 0.1–1.0)
Neutrophils Relative %: 71.8 % (ref 43.0–77.0)
Platelets: 276 10*3/uL (ref 150.0–400.0)
RBC: 3.34 Mil/uL — ABNORMAL LOW (ref 3.87–5.11)
WBC: 9.1 10*3/uL (ref 4.5–10.5)

## 2013-02-10 LAB — HEPATIC FUNCTION PANEL
ALT: 22 U/L (ref 0–35)
AST: 25 U/L (ref 0–37)
Albumin: 3.9 g/dL (ref 3.5–5.2)
Alkaline Phosphatase: 41 U/L (ref 39–117)
Total Bilirubin: 0.8 mg/dL (ref 0.3–1.2)

## 2013-02-10 NOTE — Patient Instructions (Signed)
It was good to see you today. We have reviewed your prior records today Test(s) ordered today. Your results will be released to MyChart (or called to you) after review, usually within 72hours after test completion. If any changes need to be made, you will be notified at that same time. Medications reviewed and updated - no changes recommended today Keep followup with pulmonary specialist as planned (PFTs and appointment with Dr Vassie Loll) and Dr Myrtis Ser

## 2013-02-10 NOTE — Assessment & Plan Note (Addendum)
Diet controlled by hx On ARB - prior statin stopped years ago (pt unsure why) Check a1c q6-63mo, adjust as needed The patient is asked to make an attempt to improve diet and exercise patterns to aid in medical management of this problem.  Lab Results  Component Value Date   HGBA1C 6.5 10/28/2012   02/2013 a1c now >7 - advised to begin metformin xr 500mg  qd -

## 2013-02-10 NOTE — Assessment & Plan Note (Signed)
Significant component of anxiety evident on exam (spouse and pt), likely contributing to shortness of breath symptoms Review and reassurance provided to patient and spouse today with apparently little impact Patient declines emotional component of current symptoms, declines increase in sertraline dose Continue TID BZ at same dose Support provided at length today including review of her hospitalization and surgery fall 2013

## 2013-02-10 NOTE — Assessment & Plan Note (Signed)
chronic bronchitis - chronic cough symptoms Complicated by medical compliance/confusion and lack of belief in medication efficacy driving noncompliance (?mild dementia of spouse AND patient) Also pt has macular degeneration so depends on spouse to supply her meds Check labs now No clinical evidence for infection or CHF on exam (2D echo 12/2012 normal LVEF) Recent eval OVs here and pulm + IP/ER visit 12/2012 reviewed; also imaging For follow up CT chest late 04/2013 to monitor loculation as seen 10/2012 (persisting post op 08/2012)  Continue pulm meds and cough suppression - reviewed prescribed med schedule at length today with spouse and pt Continue eval and PFTs as scheduled by pulm (last OV reviewed) Also recommended to continue combivent QID -   The patient/spouse is reassured that these symptoms do not appear to represent a serious or threatening condition. Referral to [facility] 02/08/13 for consideration of CM services at home to help manage frequent visits (OP, ER and IP) related to same

## 2013-02-10 NOTE — Telephone Encounter (Signed)
Seen in OV today

## 2013-02-10 NOTE — Progress Notes (Signed)
Subjective:    Patient ID: Paula [name], female    DOB: [birth year], 79 y.o.   MRN: [id number]  HPI  Complains of continued cough - spasms especially worst at night multiple visits here, ER and pulm for same in recent weeks -  Interval history reviewed including numerous recent office visits and overnight hospitalization 12/2012 for same   Past Medical History  Diagnosis Date  . Depression   . Ejection fraction     EF 40%, echo, November, 2012  / in improved, EF 60%, echo, December, 2012  . Contrast media allergy     Patient feels poorly with contrast  . Status post AAA (abdominal aortic aneurysm) repair     Surgical repair, Dr. Hart Rochester, December 27, 2011  . Pre-syncope     vasovagal w/ heart block  . Parotid mass     has refused further eval 10/2011  . GERD (gastroesophageal reflux disease)   . Anxiety   . Facial tic     L sided spasms - Botox trial summer 2013, ?effective  . Macular degeneration   . AAA (abdominal aortic aneurysm)     s/p repair 12/2011  . Congestive heart failure     NL EF 10/2011 echo   . Diabetes mellitus type II, controlled   . Hypothyroidism   . Sinus bradycardia 09/19/2011    Occurring simultaneously with complete heart block   . Hypertension      Review of Systems  HENT: Positive for postnasal drip. Negative for sneezing.   Respiratory: Positive for cough, chest tightness, shortness of breath and wheezing.   Cardiovascular: Negative for chest pain, palpitations and leg swelling.  Gastrointestinal: Positive for nausea. Negative for abdominal pain.  Psychiatric/Behavioral: Negative for sleep disturbance and dysphoric mood.       Objective:   Physical Exam  BP 124/80  Pulse 108  Temp(Src) 98.1 F (36.7 C) (Oral)  Resp 16  Wt 184 lb 8 oz (83.689 kg)  BMI 30.7 kg/m2  SpO2 96% Wt Readings from Last 3 Encounters:  02/10/13 184 lb 8 oz (83.689 kg)  02/08/13 188 lb 2 oz (85.333 kg)  02/05/13 186 lb 3.2 oz (84.46 kg)   Gen: NAD, nontoxic.  hoarse without change. Spouse at side Lungs: CTA B - no wheeze or crackles; diminished air movement at bases CV: RRR, no edema BLE Abd: SNTND+BS    Lab Results  Component Value Date   WBC 12.0* 01/02/2013   HGB 11.7* 01/02/2013   HCT 34.6* 01/02/2013   PLT 287 01/02/2013   GLUCOSE 145* 01/04/2013   CHOL 144 11/04/2011   TRIG 129 11/04/2011   HDL 38* 11/04/2011   LDLCALC 80 11/04/2011   ALT 12 08/31/2012   AST 16 08/31/2012   NA 141 01/04/2013   K 3.9 01/04/2013   CL 104 01/04/2013   CREATININE 1.08 01/04/2013   BUN 14 01/04/2013   CO2 30 01/04/2013   TSH 0.80 12/28/2012   INR 1.06 08/24/2012   HGBA1C 6.5 10/28/2012   11/02/12 ct chest w/o CM IMPRESSION:   1.  Loculated left pleural effusion with suspected rounded atelectasis in the left lower lobe.  No specific findings for intrathoracic malignancy identified.  If neoplasm is suspected, thoracentesis should be considered.  Otherwise, follow-up chest CT in 31-month suggested to assess stability. 2.  Evidence of prior granulomatous disease with calcified left lower lobe granuloma, calcified left hilar lymph nodes and calcified granulomas in the liver and spleen. 3.  Diffuse atherosclerosis. 4.  Cannot exclude a mesenteric mass inferior to the pancreatic tail, incompletely visualized by this examination of the chest. Further evaluation with abdominal CT to include at least oral contrast recommended.  11/10/12 CT a/p w/CM IMPRESSION:   1.  The recently demonstrated mass in the mid abdomen is well circumscribed with low density and is inferior to the pancreatic body and tail.  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 2.  Interval successful repair of the abdominal aortic aneurysm. 3.  Stable postoperative appearance of the anterior abdominal wall with small left inguinal hernia  containing only fat. 4.  Stable loculated left pleural effusion and suspected left lower lobe rounded atelectasis compared with recent chest CT.     CXR 01/02/13: IMPRESSION: 1.  The appearance of the chest, as above, is suggestive of mild congestive heart failure. 2.  Bibasilar subsegmental atelectasis and small bilateral pleural effusions. 3.  Atherosclerosis.       Assessment & Plan:   See problem list. Medications and labs reviewed today.  Nausea - associated with coughing and PND - abd exam benign - check lipase given panc cyst and LFTs - weight stable

## 2013-02-11 ENCOUNTER — Ambulatory Visit (INDEPENDENT_AMBULATORY_CARE_PROVIDER_SITE_OTHER): Payer: Medicare Other | Admitting: Cardiology

## 2013-02-11 ENCOUNTER — Telehealth: Payer: Self-pay | Admitting: Pulmonary Disease

## 2013-02-11 ENCOUNTER — Encounter: Payer: Self-pay | Admitting: Cardiology

## 2013-02-11 VITALS — BP 134/82 | HR 88 | Ht 65.0 in | Wt 184.8 lb

## 2013-02-11 DIAGNOSIS — I5032 Chronic diastolic (congestive) heart failure: Secondary | ICD-10-CM

## 2013-02-11 DIAGNOSIS — J438 Other emphysema: Secondary | ICD-10-CM

## 2013-02-11 DIAGNOSIS — J439 Emphysema, unspecified: Secondary | ICD-10-CM

## 2013-02-11 DIAGNOSIS — I498 Other specified cardiac arrhythmias: Secondary | ICD-10-CM

## 2013-02-11 DIAGNOSIS — I509 Heart failure, unspecified: Secondary | ICD-10-CM

## 2013-02-11 DIAGNOSIS — R001 Bradycardia, unspecified: Secondary | ICD-10-CM

## 2013-02-11 MED ORDER — POTASSIUM CHLORIDE ER 10 MEQ PO TBCR: 10.0000 meq | EXTENDED_RELEASE_TABLET | Freq: Every day | ORAL | Status: DC

## 2013-02-11 MED ORDER — POTASSIUM CHLORIDE ER 20 MEQ PO TBCR: 20.0000 meq | EXTENDED_RELEASE_TABLET | Freq: Every day | ORAL | Status: DC

## 2013-02-11 MED ORDER — METFORMIN HCL ER 500 MG PO TB24: 500.0000 mg | ORAL_TABLET | Freq: Every day | ORAL | Status: DC

## 2013-02-11 MED ORDER — FUROSEMIDE 20 MG PO TABS: 20.0000 mg | ORAL_TABLET | Freq: Every day | ORAL | Status: DC

## 2013-02-11 NOTE — Assessment & Plan Note (Signed)
Patient has significant COPD. She'll be following carefully with the pulmonary team.

## 2013-02-11 NOTE — Addendum Note (Signed)
Addended by: Rene Paci A on: 02/11/2013 08:34 AM   Modules accepted: Orders

## 2013-02-11 NOTE — Assessment & Plan Note (Signed)
Her heart rate is stable. No change in therapy.

## 2013-02-11 NOTE — Assessment & Plan Note (Addendum)
We know that the patient retains fluid. Currently she is mildly fluid overloaded with mild edema. Left ventricular systolic function is normal. I am going to add 20 mg of oral Lasix to her regimen daily. I talked with her at length about not eating extra salt or drinking extra fluid. She is insistent on drinking and extra bottle of water at night time. I've encouraged her and her husband to watch her weight carefully. If it goes down more than 5 pounds she should hold the Lasix. If it goes up more than 5 pounds she should be in touch with her primary team.  Based on very recent labs her potassium was on the low side and she was given 10 mEq per day. This needs to be increased to 20 mEq daily.   Today had a long discussion with the patient and her husband. I reviewed her records and talking with her at length about limiting her salt and fluid intake. I spent more than 25 minutes overall with her total care. More than half of this was with direct patient contact with the patient and her husband in the room.

## 2013-02-11 NOTE — Telephone Encounter (Signed)
See by cards this am felt to have mild fluid overload and rx lasix this may help her symptoms  Would like for her to return for PFT to adequately assess her lung fxn  If not helping call back and we can add symbicort  Please contact office for sooner follow up if symptoms do not improve or worsen or seek emergency care

## 2013-02-11 NOTE — Patient Instructions (Addendum)
Your physician has recommended you make the following change in your medication: START Lasix 20 mg daily, INCREASE your Potassium (K+) to 20 meq daily.  Watch the amount of salt you eat  Limit the extra water your drink at night  If you have a total weight gain of 5# or more total, call your primary physician If your total weight decreases by 10#, stop your lasix  Your physician wants you to follow-up in: 6 months.   You will receive a reminder letter in the mail two months in advance. If you don't receive a letter, please call our office to schedule the follow-up appointment.

## 2013-02-11 NOTE — Progress Notes (Signed)
HPI  Patient is seen for followup her overall cardiac status. She has had multiple hospitalizations for pulmonary problems. Is my understanding there is also been some volume overload. She's being followed carefully as an outpatient. She is weighing herself but I do not have the values.  The patient did have an echo on January 02, 2013. The ejection fraction was 55-60%. There was mild increase in right ventricular size.  Allergies  Allergen Reactions  . Contrast Media (Iodinated Diagnostic Agents) Anaphylaxis  . Iohexol Anaphylaxis, Shortness Of Breath and Swelling     Desc: PT STATES SHE HAD A SEVERE REACTION TO IV CONRAST WITH THROAT SWELLING AND SOB. SHE WAS ADMITTED TO THE HOSPITAL. SHE HAS NEVER HAD CONTRAST AGAIN.   Marland Kitchen Penicillins Rash  . Sulfa Antibiotics Anaphylaxis  . Avelox (Moxifloxacin Hcl In Nacl) Rash  . Ciprofloxacin Itching    Current Outpatient Prescriptions  Medication Sig Dispense Refill  . albuterol (PROVENTIL HFA;VENTOLIN HFA) 108 (90 BASE) MCG/ACT inhaler Inhale 2 puffs into the lungs every 6 (six) hours as needed for wheezing.  1 Inhaler  2  . ALPRAZolam (XANAX) 0.5 MG tablet Take 0.5 mg by mouth 3 (three) times daily.       Marland Kitchen aspirin EC 81 MG tablet Take 81 mg by mouth daily.      . baclofen (LIORESAL) 10 MG tablet Take 0.5 tablets (5 mg total) by mouth at bedtime.  30 each  3  . benzonatate (TESSALON) 200 MG capsule Take 1 capsule (200 mg total) by mouth 2 (two) times daily. For cough control  60 capsule  1  . CALCIUM-VITAMIN D PO Take 1 tablet by mouth every other day.       . cetirizine (ZYRTEC) 10 MG tablet Take 1 tablet (10 mg total) by mouth daily.  30 tablet  11  . guaiFENesin (MUCINEX) 600 MG 12 hr tablet Take 600 mg by mouth daily.       Marland Kitchen HYDROcodone-homatropine (HYCODAN) 5-1.5 MG/5ML syrup Take 5 mLs by mouth at bedtime. For cough control  200 mL  1  . Ipratropium-Albuterol (COMBIVENT) 20-100 MCG/ACT AERS respimat Inhale 1 puff into the lungs every 6  (six) hours.  4 g  0  . levothyroxine (SYNTHROID, LEVOTHROID) 175 MCG tablet Take 175 mcg by mouth daily.      Marland Kitchen losartan-hydrochlorothiazide (HYZAAR) 50-12.5 MG per tablet Take 1 tablet by mouth daily.  90 tablet  3  . meloxicam (MOBIC) 7.5 MG tablet Take 1 tablet (7.5 mg total) by mouth daily.  30 tablet  3  . metFORMIN (GLUCOPHAGE XR) 500 MG 24 hr tablet Take 1 tablet (500 mg total) by mouth daily with breakfast.  90 tablet  3  . ondansetron (ZOFRAN) 4 MG tablet Take 1 tablet (4 mg total) by mouth every 8 (eight) hours as needed for nausea.  20 tablet  1  . pantoprazole (PROTONIX) 40 MG tablet Take 1 tablet (40 mg total) by mouth daily.  30 tablet  5  . potassium chloride (K-TABS) 10 MEQ tablet Take 1 tablet (10 mEq total) by mouth daily.  30 tablet  3  . sertraline (ZOLOFT) 50 MG tablet Take 1 tablet (50 mg total) by mouth daily.  90 tablet  1   No current facility-administered medications for this visit.    History   Social History  . Marital Status: Married    Spouse Name: N/A    Number of Children: 4  . Years of Education: N/A  Occupational History  . retired    Social History Main Topics  . Smoking status: Former Smoker -- 1.00 packs/day for 30 years    Types: Cigarettes    Quit date: 08/31/2011  . Smokeless tobacco: Never Used  . Alcohol Use: No  . Drug Use: No  . Sexually Active: Not Currently    Birth Control/ Protection: Post-menopausal   Other Topics Concern  . Not on file   Social History Narrative  . No narrative on file    Family History  Problem Relation Age of Onset  . Esophageal cancer Father   . Breast cancer Sister   . Colon cancer Sister   . Heart disease Sister   . Heart disease Mother     Past Medical History  Diagnosis Date  . Depression   . Ejection fraction     EF 40%, echo, November, 2012  / in improved, EF 60%, echo, December, 2012  . Contrast media allergy     Patient feels poorly with contrast  . Status post AAA (abdominal  aortic aneurysm) repair     Surgical repair, Dr. Hart Rochester, December 27, 2011  . Pre-syncope     vasovagal w/ heart block  . Parotid mass     has refused further eval 10/2011  . GERD (gastroesophageal reflux disease)   . Anxiety   . Facial tic     L sided spasms - Botox trial summer 2013, ?effective  . Macular degeneration   . AAA (abdominal aortic aneurysm)     s/p repair 12/2011  . Congestive heart failure     NL EF 10/2011 echo   . Diabetes mellitus type II, controlled   . Hypothyroidism   . Sinus bradycardia 09/19/2011    Occurring simultaneously with complete heart block   . Hypertension     Past Surgical History  Procedure Laterality Date  . Cholecystectomy    . Knee cartilage surgery      left  . Sinus surgery with instatrak    . Appendectomy    . Oophorectomy      ovarian cyst  . Cataract extraction      bilateral  . Cardiac catheterization  11/04/11  . Bladder repair    . US echocardiography  10/14/11  . Abdominal aortic aneurysm repair  12/27/2011    Procedure: ANEURYSM ABDOMINAL AORTIC REPAIR;  Surgeon: Josephina Gip, MD;  Location: [facility];  Service: Vascular;  Laterality: N/A;  Resection and Grafting Abdominal Aortic Aneurysm , Aorta Bi Iliac.    Patient Active Problem List  Diagnosis  . COPD (chronic obstructive pulmonary disease) with emphysema  . Diabetes mellitus type II, controlled  . Parotid mass  . Hypothyroidism  . Sinus bradycardia  . Congestive heart failure  . Ejection fraction  . Contrast media allergy  . Facial twitching  . Depression  . Anxiety  . Pre-syncope  . Status post AAA (abdominal aortic aneurysm) repair  . AAA (abdominal aortic aneurysm)  . Recurrent pneumonia  . Hypertension  . Leukocytosis, unspecified  . Chronic asthmatic bronchitis with acute exacerbation    ROS   Patient says she feels bad in general. She denies fever, chills, headache, sweats, rash, change in vision, change in hearing, chest pain, nausea vomiting, urinary  symptoms. All other systems are reviewed and are negative.  PHYSICAL EXAM  Patient is here with her husband. She is oriented to person time and place. Affect is normal. There is no jugulovenous distention. She does have a few rales  on exam. There is no respiratory distress. Cardiac exam reveals S1 and S2. There no clicks or significant murmurs. The abdomen is soft. She has 1+ peripheral edema.  Filed Vitals:   02/11/13 0913  BP: 134/82  Pulse: 88  Height: 5\' 5"  (1.651 m)  Weight: 184 lb 12.8 oz (83.825 kg)  SpO2: 96%   EKG is done today and reviewed by me. She has sinus rhythm. There is first-degree AV block. The rate is 85.  ASSESSMENT & PLAN

## 2013-02-11 NOTE — Telephone Encounter (Signed)
I spoke with pt. She c/o wheezing off and on and the combivent does not help. Denies any increase SOB, chest tx, cough. Just c/o the wheezing. It does not happen at a particular time of day or is not caused by certain activities. She states she can start wheezing at rest. Please advise TP thanks Last OV w/ TP 02/05/13 Pending 03/19/13 w/ PFT w/ RA

## 2013-02-11 NOTE — Telephone Encounter (Signed)
I spoke with pt. She verbalized understanding of TP recs. Nothing further was needed

## 2013-02-12 ENCOUNTER — Emergency Department (HOSPITAL_COMMUNITY): Payer: Medicare Other

## 2013-02-12 ENCOUNTER — Emergency Department (HOSPITAL_COMMUNITY)
Admission: EM | Admit: 2013-02-12 | Discharge: 2013-02-12 | Disposition: A | Discharge status: Home / Self Care | Payer: Medicare Other | Attending: Emergency Medicine | Admitting: Emergency Medicine

## 2013-02-12 ENCOUNTER — Encounter (HOSPITAL_COMMUNITY): Payer: Self-pay

## 2013-02-12 DIAGNOSIS — J4489 Other specified chronic obstructive pulmonary disease: Secondary | ICD-10-CM | POA: Insufficient documentation

## 2013-02-12 DIAGNOSIS — K219 Gastro-esophageal reflux disease without esophagitis: Secondary | ICD-10-CM | POA: Insufficient documentation

## 2013-02-12 DIAGNOSIS — F411 Generalized anxiety disorder: Secondary | ICD-10-CM | POA: Insufficient documentation

## 2013-02-12 DIAGNOSIS — Z9861 Coronary angioplasty status: Secondary | ICD-10-CM | POA: Insufficient documentation

## 2013-02-12 DIAGNOSIS — Z79899 Other long term (current) drug therapy: Secondary | ICD-10-CM | POA: Insufficient documentation

## 2013-02-12 DIAGNOSIS — R490 Dysphonia: Secondary | ICD-10-CM | POA: Insufficient documentation

## 2013-02-12 DIAGNOSIS — Z9889 Other specified postprocedural states: Secondary | ICD-10-CM | POA: Insufficient documentation

## 2013-02-12 DIAGNOSIS — Z7982 Long term (current) use of aspirin: Secondary | ICD-10-CM | POA: Insufficient documentation

## 2013-02-12 DIAGNOSIS — I509 Heart failure, unspecified: Secondary | ICD-10-CM | POA: Insufficient documentation

## 2013-02-12 DIAGNOSIS — J40 Bronchitis, not specified as acute or chronic: Secondary | ICD-10-CM

## 2013-02-12 DIAGNOSIS — F3289 Other specified depressive episodes: Secondary | ICD-10-CM | POA: Insufficient documentation

## 2013-02-12 DIAGNOSIS — Z8679 Personal history of other diseases of the circulatory system: Secondary | ICD-10-CM | POA: Insufficient documentation

## 2013-02-12 DIAGNOSIS — F329 Major depressive disorder, single episode, unspecified: Secondary | ICD-10-CM | POA: Insufficient documentation

## 2013-02-12 DIAGNOSIS — Z8659 Personal history of other mental and behavioral disorders: Secondary | ICD-10-CM | POA: Insufficient documentation

## 2013-02-12 DIAGNOSIS — M542 Cervicalgia: Secondary | ICD-10-CM | POA: Insufficient documentation

## 2013-02-12 DIAGNOSIS — E119 Type 2 diabetes mellitus without complications: Secondary | ICD-10-CM | POA: Insufficient documentation

## 2013-02-12 DIAGNOSIS — Z8669 Personal history of other diseases of the nervous system and sense organs: Secondary | ICD-10-CM | POA: Insufficient documentation

## 2013-02-12 DIAGNOSIS — R05 Cough: Secondary | ICD-10-CM | POA: Insufficient documentation

## 2013-02-12 DIAGNOSIS — J449 Chronic obstructive pulmonary disease, unspecified: Secondary | ICD-10-CM | POA: Insufficient documentation

## 2013-02-12 DIAGNOSIS — E039 Hypothyroidism, unspecified: Secondary | ICD-10-CM | POA: Insufficient documentation

## 2013-02-12 DIAGNOSIS — Z87891 Personal history of nicotine dependence: Secondary | ICD-10-CM | POA: Insufficient documentation

## 2013-02-12 DIAGNOSIS — R059 Cough, unspecified: Secondary | ICD-10-CM | POA: Insufficient documentation

## 2013-02-12 LAB — URINALYSIS, ROUTINE W REFLEX MICROSCOPIC
Hgb urine dipstick: NEGATIVE
Leukocytes, UA: NEGATIVE
Nitrite: NEGATIVE
Specific Gravity, Urine: 1.013 (ref 1.005–1.030)
Urobilinogen, UA: 0.2 mg/dL (ref 0.0–1.0)

## 2013-02-12 NOTE — ED Notes (Signed)
Per EMS, Pt, from home, presents with intermittent confusion x 2 days.  Pt's husband sts Pt has "not been acting right, she was running around barefoot in the street yesterday and said that her deceased mother-in-law came to visit."  Vitals are stable.  A & Ox4.  Pt sts chronic neck pain and sts she was recently diagnosed with Bronchitis.

## 2013-02-12 NOTE — ED Notes (Addendum)
Pt's husband sts he called EMS, because "she just looks like she needs to be seen."  Sts "she was a little confused yesterday."  Pt denies confusion and sts that she knew everything that she did.   Pt c/o cough and SOB, after coughing, last night.  SOB has resolved.  Pt sts she has been seen "several times recently for a cough and bronchitis."  Hx COPD.

## 2013-02-12 NOTE — ED Notes (Signed)
Pt escorted to discharge window. Verbalized understanding discharge instructions. In no acute distress.  Vital signs reviewed and WDL.

## 2013-02-12 NOTE — ED Notes (Signed)
Pt's husband notified this RN that Pt was coughing.  Upon assessment, airway was patent and clear.  Pt repositioned for comfort.

## 2013-02-12 NOTE — ED Notes (Signed)
Patient transported to X-ray 

## 2013-02-12 NOTE — ED Provider Notes (Signed)
History     CSN: [id number]  Arrival date & time 02/12/13  1204   First MD Initiated Contact with Patient 02/12/13 1404      Chief Complaint  Patient presents with  . Altered Mental Status  . Cough    (Consider location/radiation/quality/duration/timing/severity/associated sxs/prior treatment) HPI Pt presenting with c/o hoarse voice and cough.  She states that yesterday she thought that she was choking.  She also complains of numerous other chronic complaints such as neck pain.  States she has seen her doctors about her cough and hoarseness and has been diagnosed with bronchitis.  No fever, no vomiting, no chest pain.  States she feels she needs to cough something up and is unable.  Per EMS and in triage notes pt was confused, however when I discuss this with patient and husband they do not relay a similar story.  Pt states she was upset yesterday because she thought she was choking and ran outside.  Husband is agreeable with this explanation of symptoms.  There are no other associated systemic symptoms, there are no other alleviating or modifying factors.   Past Medical History  Diagnosis Date  . Depression   . Ejection fraction     EF 40%, echo, November, 2012  / in improved, EF 60%, echo, December, 2012  . Contrast media allergy     Patient feels poorly with contrast  . Status post AAA (abdominal aortic aneurysm) repair     Surgical repair, Dr. Hart Rochester, December 27, 2011  . Pre-syncope     vasovagal w/ heart block  . Parotid mass     has refused further eval 10/2011  . GERD (gastroesophageal reflux disease)   . Anxiety   . Facial tic     L sided spasms - Botox trial summer 2013, ?effective  . Macular degeneration   . AAA (abdominal aortic aneurysm)     s/p repair 12/2011  . Congestive heart failure     NL EF 10/2011 echo   . Diabetes mellitus type II, controlled   . Hypothyroidism   . Sinus bradycardia 09/19/2011    Occurring simultaneously with complete heart block   .  Hypertension   . COPD (chronic obstructive pulmonary disease)     Past Surgical History  Procedure Laterality Date  . Cholecystectomy    . Knee cartilage surgery      left  . Sinus surgery with instatrak    . Appendectomy    . Oophorectomy      ovarian cyst  . Cataract extraction      bilateral  . Cardiac catheterization  11/04/11  . Bladder repair    . US echocardiography  10/14/11  . Abdominal aortic aneurysm repair  12/27/2011    Procedure: ANEURYSM ABDOMINAL AORTIC REPAIR;  Surgeon: Josephina Gip, MD;  Location: [facility];  Service: Vascular;  Laterality: N/A;  Resection and Grafting Abdominal Aortic Aneurysm , Aorta Bi Iliac.    Family History  Problem Relation Age of Onset  . Esophageal cancer Father   . Breast cancer Sister   . Colon cancer Sister   . Heart disease Sister   . Heart disease Mother     History  Substance Use Topics  . Smoking status: Former Smoker -- 1.00 packs/day for 30 years    Types: Cigarettes    Quit date: 08/31/2011  . Smokeless tobacco: Never Used  . Alcohol Use: No    OB History   Grav Para Term Preterm Abortions TAB SAB Ect Mult  Living                  Review of Systems ROS reviewed and all otherwise negative except for mentioned in HPI  Allergies  Contrast media; Iohexol; Penicillins; Sulfa antibiotics; Avelox; and Ciprofloxacin  Home Medications   Current Outpatient Rx  Name  Route  Sig  Dispense  Refill  . albuterol (PROVENTIL HFA;VENTOLIN HFA) 108 (90 BASE) MCG/ACT inhaler   Inhalation   Inhale 2 puffs into the lungs every 6 (six) hours as needed for wheezing.   1 Inhaler   2   . ALPRAZolam (XANAX) 0.5 MG tablet   Oral   Take 0.5 mg by mouth 3 (three) times daily.          Marland Kitchen aspirin EC 81 MG tablet   Oral   Take 81 mg by mouth every morning.          . baclofen (LIORESAL) 10 MG tablet   Oral   Take 0.5 tablets (5 mg total) by mouth at bedtime.   30 each   3   . benzonatate (TESSALON) 200 MG capsule   Oral    Take 1 capsule (200 mg total) by mouth 2 (two) times daily. For cough control   60 capsule   1   . cetirizine (ZYRTEC) 10 MG tablet   Oral   Take 1 tablet (10 mg total) by mouth daily.   30 tablet   11   . furosemide (LASIX) 20 MG tablet   Oral   Take 1 tablet (20 mg total) by mouth daily.   30 tablet   6   . guaiFENesin (MUCINEX) 600 MG 12 hr tablet   Oral   Take 600 mg by mouth daily.          Marland Kitchen HYDROcodone-homatropine (HYCODAN) 5-1.5 MG/5ML syrup   Oral   Take 5 mLs by mouth at bedtime. For cough control   200 mL   1   . Ipratropium-Albuterol (COMBIVENT) 20-100 MCG/ACT AERS respimat   Inhalation   Inhale 1 puff into the lungs every 6 (six) hours.   4 g   0   . levothyroxine (SYNTHROID, LEVOTHROID) 175 MCG tablet   Oral   Take 175 mcg by mouth daily.         Marland Kitchen losartan-hydrochlorothiazide (HYZAAR) 50-12.5 MG per tablet   Oral   Take 1 tablet by mouth daily.   90 tablet   3   . meloxicam (MOBIC) 7.5 MG tablet   Oral   Take 1 tablet (7.5 mg total) by mouth daily.   30 tablet   3   . metFORMIN (GLUCOPHAGE XR) 500 MG 24 hr tablet   Oral   Take 1 tablet (500 mg total) by mouth daily with breakfast.   90 tablet   3   . ondansetron (ZOFRAN) 4 MG tablet   Oral   Take 1 tablet (4 mg total) by mouth every 8 (eight) hours as needed for nausea.   20 tablet   1   . pantoprazole (PROTONIX) 40 MG tablet   Oral   Take 1 tablet (40 mg total) by mouth daily.   30 tablet   5   . Potassium Chloride ER (K-TABS) 20 MEQ TBCR   Oral   Take 20 mEq by mouth daily.   30 tablet   6     BP 165/75  Pulse 92  Temp(Src) 99 F (37.2 C) (Oral)  Resp 26  SpO2 94% Vitals  reviewed Physical Exam Physical Examination: General appearance - alert, well appearing, and in no distress Mental status - alert, oriented to person, place, and time Eyes - no conjunctival injection, no scleral icterus Mouth - mucous membranes moist, pharynx normal without lesions Chest -  clear to auscultation, no wheezes, rales or rhonchi, symmetric air entry, no wheezing, no increased respiratory effort, speaking in full sentences, no stridor Heart - normal rate, regular rhythm, normal S1, S2, no murmurs, rubs, clicks or gallops Abdomen - soft, nontender, nondistended, no masses or organomegaly Extremities - peripheral pulses normal, no pedal edema, no clubbing or cyanosis Skin - normal coloration and turgor, no rashes  ED Course  Procedures (including critical care time)  Labs Reviewed  URINALYSIS, ROUTINE W REFLEX MICROSCOPIC   Dg Chest 2 View  02/12/2013  *RADIOLOGY REPORT*  Clinical Data: Altered mental status.  Cough and weakness.  CHEST - 2 VIEW  Comparison: Two-view chest 01/02/2013.  Findings: The heart is enlarged.  This small bilateral pleural effusions are present, left greater than right.  There is associated bibasilar airspace disease, likely reflecting atelectasis.  Mild pulmonary vascular congestion is improved.  IMPRESSION:  1.  Cardiomegaly and persistent small bilateral pleural effusions, left greater than right.  This likely represents mild congestive heart failure. 2.  Significantly improved pulmonary vascular congestion. 3.  Minimal bibasilar atelectasis is more prominent on the left.   Original Report Authenticated By: Marin Roberts, M.D.      1. Bronchitis       MDM  Pt presenting with concern for cough and hoarse voice.  She has recently been diagnosed with bronchitis and i agree with this assessment.  CXR reassuring.  Pt is using albuterol and should continue to do this.  I do not see any indication for antibiotics at this time.          Ethelda Chick, MD 02/12/13 [phone]

## 2013-02-15 ENCOUNTER — Telehealth: Payer: Self-pay | Admitting: Pulmonary Disease

## 2013-02-15 NOTE — Telephone Encounter (Signed)
Message copied by Antionette Fairy on Mon Feb 15, 2013  9:02 AM ------      Message from: Jerelyn Scott      Created: Fri Feb 12, 2013  4:17 PM      Regarding: Order for BRIELYN, BOSAK             Patient Name: Paula [name], Paula [name]([id number])      Sex: Female      DOB: 1927-07-05              PCP: Rene Paci A        Center: [facility]             Types of orders made on 02/12/2013: Consult, Imaging, Lab            Order Date:02/12/2013      Ordering Trellis Paganini, MARTHA [[id number]]      Attending Provider:Martha Johny Shock, MD [phone]      Authorizing Provider: Ethelda Chick, MD [phone]      Department:[facility]PT[[id number]]            Order Specific Information      Order: COPD clinic follow up [Custom: CON4000]  Order #: [id number]  Qty: 1        Priority: Routine  Class: Hospital Performed          Where does the patient receive follow up COPD care -> [facility]                 Follow up in -> 5-7 days               Released on: 02/12/2013  4:17 PM                    Priority: Routine  Class: Hospital Performed          Where does the patient receive follow up COPD care -> [facility]                 Follow up in -> 5-7 days               Released on: 02/12/2013  4:17 PM       ------

## 2013-02-16 ENCOUNTER — Encounter: Payer: Self-pay | Admitting: Internal Medicine

## 2013-02-16 ENCOUNTER — Ambulatory Visit (INDEPENDENT_AMBULATORY_CARE_PROVIDER_SITE_OTHER): Payer: Medicare Other | Admitting: Internal Medicine

## 2013-02-16 VITALS — BP 132/80 | HR 88 | Temp 100.0°F | Wt 179.0 lb

## 2013-02-16 DIAGNOSIS — F329 Major depressive disorder, single episode, unspecified: Secondary | ICD-10-CM

## 2013-02-16 DIAGNOSIS — J029 Acute pharyngitis, unspecified: Secondary | ICD-10-CM

## 2013-02-16 MED ORDER — AZITHROMYCIN 250 MG PO TABS: ORAL_TABLET | ORAL | Status: DC

## 2013-02-16 MED ORDER — MAGIC MOUTHWASH W/LIDOCAINE: 5.0000 mL | Freq: Three times a day (TID) | ORAL | Status: DC | PRN

## 2013-02-16 MED ORDER — SERTRALINE HCL 50 MG PO TABS: 50.0000 mg | ORAL_TABLET | Freq: Every day | ORAL | Status: DC

## 2013-02-16 NOTE — Progress Notes (Signed)
Subjective:    Patient ID: Paula [name], female    DOB: Sep 04, 1927, 79 y.o.   MRN: [id number]  Sore Throat  This is a new problem. The current episode started yesterday. The problem has been gradually worsening. The pain is worse on the right side. The maximum temperature recorded prior to her arrival was 100 - 100.9 F. The pain is at a severity of 5/10. The pain is moderate. Associated symptoms include coughing (chronic), ear pain, a hoarse voice, shortness of breath, swollen glands and trouble swallowing. Pertinent negatives include no abdominal pain, congestion, diarrhea, ear discharge, headaches, neck pain or vomiting. She has had exposure to strep. She has tried cool liquids for the symptoms. The treatment provided no relief.    Spouse also concerned that patient has discontinued her sertraline, question need to resume same because of tearfulness and increasing irritability over past 3 days  multiple visits here, ER and pulm for same in recent weeks for her chronic cough and shortness of breath -  Interval history reviewed including numerous recent office visits and overnight hospitalization 12/2012 for same   Past Medical History  Diagnosis Date  . Depression   . Ejection fraction     EF 40%, echo, November, 2012  / in improved, EF 60%, echo, December, 2012  . Contrast media allergy     Patient feels poorly with contrast  . Status post AAA (abdominal aortic aneurysm) repair     Surgical repair, Dr. Hart [place], December 27, 2011  . Pre-syncope     vasovagal w/ heart block  . Parotid mass     has refused further eval 10/2011  . GERD (gastroesophageal reflux disease)   . Anxiety   . Facial tic     L sided spasms - Botox trial summer 2013, ?effective  . Macular degeneration   . AAA (abdominal aortic aneurysm)     s/p repair 12/2011  . Congestive heart failure     NL EF 10/2011 echo   . Diabetes mellitus type II, controlled   . Hypothyroidism   . Sinus bradycardia 09/19/2011   Occurring simultaneously with complete heart block   . Hypertension   . COPD (chronic obstructive pulmonary disease)      Review of Systems  HENT: Positive for ear pain, hoarse voice, trouble swallowing and postnasal drip. Negative for congestion, sneezing, neck pain and ear discharge.   Respiratory: Positive for cough (chronic), chest tightness, shortness of breath and wheezing.   Cardiovascular: Negative for chest pain, palpitations and leg swelling.  Gastrointestinal: Positive for nausea. Negative for vomiting, abdominal pain and diarrhea.  Neurological: Negative for headaches.  Psychiatric/Behavioral: Negative for sleep disturbance and dysphoric mood.       Objective:   Physical Exam  BP 132/80  Pulse 88  Temp(Src) 100 F (37.8 C) (Oral)  Wt 179 lb (81.194 kg)  BMI 29.79 kg/m2  SpO2 95% Wt Readings from Last 3 Encounters:  02/16/13 179 lb (81.194 kg)  02/11/13 184 lb 12.8 oz (83.825 kg)  02/10/13 184 lb 8 oz (83.689 kg)   Gen: NAD, nontoxic. hoarse without change. Spouse at side HENT: sinuses nontender. Tympanic membranes clear bilaterally without effusion. Oropharynx with erythema and mild exudate on the right side. Nares without discharge or turbinate swelling Lungs: CTA B - no wheeze or crackles; diminished air movement at bases CV: RRR, no edema BLE Abd: SNTND+BS Psychiatric: emotional and tearful today, tangential (more than usual)   Lab Results  Component Value Date   WBC  9.1 02/10/2013   HGB 11.5* 02/10/2013   HCT 33.9* 02/10/2013   PLT 276.0 02/10/2013   GLUCOSE 267* 02/10/2013   CHOL 149 02/10/2013   TRIG 218.0* 02/10/2013   HDL 30.10* 02/10/2013   LDLDIRECT 90.6 02/10/2013   LDLCALC 80 11/04/2011   ALT 22 02/10/2013   AST 25 02/10/2013   NA 139 02/10/2013   K 3.1* 02/10/2013   CL 102 02/10/2013   CREATININE 0.8 02/10/2013   BUN 10 02/10/2013   CO2 27 02/10/2013   TSH 0.80 12/28/2012   INR 1.06 08/24/2012   HGBA1C 7.2* 02/10/2013   11/02/12 ct chest w/o CM IMPRESSION:   1.   Loculated left pleural effusion with suspected rounded atelectasis in the left lower lobe.  No specific findings for intrathoracic malignancy identified.  If neoplasm is suspected, thoracentesis should be considered.  Otherwise, follow-up chest CT in 38-month suggested to assess stability. 2.  Evidence of prior granulomatous disease with calcified left lower lobe granuloma, calcified left hilar lymph nodes and calcified granulomas in the liver and spleen. 3.  Diffuse atherosclerosis. 4.  Cannot exclude a mesenteric mass inferior to the pancreatic tail, incompletely visualized by this examination of the chest. Further evaluation with abdominal CT to include at least oral contrast recommended.  11/10/12 CT a/p w/CM IMPRESSION:   1.  The recently demonstrated mass in the mid abdomen is well circumscribed with low density and is inferior to the pancreatic body and tail.  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 2.  Interval successful repair of the abdominal aortic aneurysm. 3.  Stable postoperative appearance of the anterior abdominal wall with small left inguinal hernia containing only fat. 4.  Stable loculated left pleural effusion and suspected left lower lobe rounded atelectasis compared with recent chest CT.     CXR 01/02/13: IMPRESSION: 1.  The appearance of the chest, as above, is suggestive of mild congestive heart failure. 2.  Bibasilar subsegmental atelectasis and small bilateral pleural effusions. 3.  Atherosclerosis.       Assessment & Plan:   See problem list. Medications and labs reviewed today.  Acute pharyngitis, exudative. Multiple healthcare exposures. Will treat with empiric Z-Pak and Magic mouthwash with lidocaine. Reassurance provided. Patient to call sooner if symptoms worse or unimproved with therapy

## 2013-02-16 NOTE — Patient Instructions (Signed)
It was good to see you today. Zpak antibiotics and Magic mouthwash gargle as needed for sore throat symptoms - Your prescription(s) have been submitted to your pharmacy. Please take as directed and contact our office if you believe you are having problem(s) with the medication(s). Resume sertraline 50 mg daily for anxiety and depression Will refer to behavioral health therapy for counseling as discussed Other medications reviewed and updated, no other changes recommended

## 2013-02-16 NOTE — Assessment & Plan Note (Signed)
Significant component of depression/anxiety evident on exam (spouse and pt), likely contributing to shortness of breath symptoms and overwhelming fatigue with fear of dying Review and reassurance provided to patient and spouse today with apparently little impact Abrupt DC sertraline in past week has exac symptoms - very emotional today Resume same sertraline dose Continue TID BZ at same dose Support provided at length today including review of her hospitalization and surgery fall 2013 Refer to behav health for therapy/counseling

## 2013-02-19 ENCOUNTER — Telehealth: Payer: Self-pay | Admitting: Internal Medicine

## 2013-02-19 ENCOUNTER — Ambulatory Visit (INDEPENDENT_AMBULATORY_CARE_PROVIDER_SITE_OTHER)
Admission: RE | Admit: 2013-02-19 | Discharge: 2013-02-19 | Disposition: A | Discharge status: Home / Self Care | Payer: Medicare Other | Source: Ambulatory Visit | Attending: Internal Medicine | Admitting: Internal Medicine

## 2013-02-19 ENCOUNTER — Encounter (HOSPITAL_COMMUNITY): Payer: Self-pay | Admitting: *Deleted

## 2013-02-19 ENCOUNTER — Emergency Department (HOSPITAL_COMMUNITY)
Admission: EM | Admit: 2013-02-19 | Discharge: 2013-02-19 | Disposition: A | Discharge status: Home / Self Care | Payer: Medicare Other | Attending: Emergency Medicine | Admitting: Emergency Medicine

## 2013-02-19 ENCOUNTER — Encounter: Payer: Self-pay | Admitting: Internal Medicine

## 2013-02-19 ENCOUNTER — Ambulatory Visit (INDEPENDENT_AMBULATORY_CARE_PROVIDER_SITE_OTHER): Payer: Medicare Other | Admitting: Internal Medicine

## 2013-02-19 VITALS — BP 130/80 | HR 80 | Temp 98.8°F | Resp 16 | Wt 178.0 lb

## 2013-02-19 DIAGNOSIS — K219 Gastro-esophageal reflux disease without esophagitis: Secondary | ICD-10-CM | POA: Insufficient documentation

## 2013-02-19 DIAGNOSIS — R51 Headache: Secondary | ICD-10-CM

## 2013-02-19 DIAGNOSIS — E876 Hypokalemia: Secondary | ICD-10-CM

## 2013-02-19 DIAGNOSIS — E119 Type 2 diabetes mellitus without complications: Secondary | ICD-10-CM | POA: Insufficient documentation

## 2013-02-19 DIAGNOSIS — I10 Essential (primary) hypertension: Secondary | ICD-10-CM

## 2013-02-19 DIAGNOSIS — Z8669 Personal history of other diseases of the nervous system and sense organs: Secondary | ICD-10-CM | POA: Insufficient documentation

## 2013-02-19 DIAGNOSIS — I509 Heart failure, unspecified: Secondary | ICD-10-CM | POA: Insufficient documentation

## 2013-02-19 DIAGNOSIS — R519 Headache, unspecified: Secondary | ICD-10-CM | POA: Insufficient documentation

## 2013-02-19 DIAGNOSIS — R110 Nausea: Secondary | ICD-10-CM

## 2013-02-19 DIAGNOSIS — F329 Major depressive disorder, single episode, unspecified: Secondary | ICD-10-CM | POA: Insufficient documentation

## 2013-02-19 DIAGNOSIS — Z8679 Personal history of other diseases of the circulatory system: Secondary | ICD-10-CM | POA: Insufficient documentation

## 2013-02-19 DIAGNOSIS — F411 Generalized anxiety disorder: Secondary | ICD-10-CM | POA: Insufficient documentation

## 2013-02-19 DIAGNOSIS — J449 Chronic obstructive pulmonary disease, unspecified: Secondary | ICD-10-CM | POA: Insufficient documentation

## 2013-02-19 DIAGNOSIS — Z87891 Personal history of nicotine dependence: Secondary | ICD-10-CM | POA: Insufficient documentation

## 2013-02-19 DIAGNOSIS — Z9861 Coronary angioplasty status: Secondary | ICD-10-CM | POA: Insufficient documentation

## 2013-02-19 DIAGNOSIS — Z7982 Long term (current) use of aspirin: Secondary | ICD-10-CM | POA: Insufficient documentation

## 2013-02-19 DIAGNOSIS — Z79899 Other long term (current) drug therapy: Secondary | ICD-10-CM | POA: Insufficient documentation

## 2013-02-19 DIAGNOSIS — Z8659 Personal history of other mental and behavioral disorders: Secondary | ICD-10-CM | POA: Insufficient documentation

## 2013-02-19 DIAGNOSIS — Z9889 Other specified postprocedural states: Secondary | ICD-10-CM | POA: Insufficient documentation

## 2013-02-19 DIAGNOSIS — J4489 Other specified chronic obstructive pulmonary disease: Secondary | ICD-10-CM | POA: Insufficient documentation

## 2013-02-19 DIAGNOSIS — F3289 Other specified depressive episodes: Secondary | ICD-10-CM | POA: Insufficient documentation

## 2013-02-19 DIAGNOSIS — E039 Hypothyroidism, unspecified: Secondary | ICD-10-CM | POA: Insufficient documentation

## 2013-02-19 LAB — URINE MICROSCOPIC-ADD ON

## 2013-02-19 LAB — URINALYSIS, ROUTINE W REFLEX MICROSCOPIC
Bilirubin Urine: NEGATIVE
Nitrite: NEGATIVE
Specific Gravity, Urine: 1.028 (ref 1.005–1.030)
Urobilinogen, UA: 1 mg/dL (ref 0.0–1.0)

## 2013-02-19 MED ORDER — METOCLOPRAMIDE HCL 10 MG PO TABS: 10.0000 mg | ORAL_TABLET | Freq: Four times a day (QID) | ORAL | Status: DC

## 2013-02-19 MED ORDER — ACETAMINOPHEN 500 MG PO TABS
500.0000 mg | ORAL_TABLET | Freq: Once | ORAL | Status: AC
  Administered 2013-02-19: 500 mg via ORAL
  Filled 2013-02-19: qty 1

## 2013-02-19 MED ORDER — METOCLOPRAMIDE HCL 5 MG PO TABS
5.0000 mg | ORAL_TABLET | Freq: Once | ORAL | Status: AC
  Administered 2013-02-19: 5 mg via ORAL
  Filled 2013-02-19: qty 1

## 2013-02-19 NOTE — Assessment & Plan Note (Signed)
Today I will recheck her K+ level and will check her Mg++ level as well as it may need to be replaced as well as the K+

## 2013-02-19 NOTE — Telephone Encounter (Signed)
Rose from CT called to advised that pt CT report is in Epic.Thanks

## 2013-02-19 NOTE — Telephone Encounter (Signed)
[facility] Triage Call Report Triage Record Num: [id number] Operator: Dustin Flock [place] Patient Name: Paula [name] Call Date & Time: 02/19/2013 3:49:06AM Patient Phone: [phone] PCP: Rene Paci Patient Gender: Female PCP Fax : [phone] Patient DOB: 03-13-1927 Practice Name: [facility] Reason for Call: Caller: Sammi/Patient; PCP: Rene Paci (Adults only); CB#: [phone]; Call regarding Nausea; States she feels severly weak. Husband [name] is with her and she keeps stating this is not her husband, she did not know who he is. [name] states she is confused. Emergent S&S identified per Nausea/ vomiting protocol: "New or worsening S&S that may indicate shock" Advised 911. Pt is denying going back to hospital, husband states he will try to get her to let him call 911. Protocol(s) Used: Nausea or Vomiting Recommended Outcome per Protocol: Activate EMS 911 Reason for Outcome: New or worsening signs and symptoms that may indicate shock Care Advice: ~ If vomiting occurs or appears likely, turn on side to prevent aspiration. Write down provider's name. List or place the following in a bag for transport with the patient: current prescription and/or nonprescription medications; alternative treatments, therapies and medications; and street drugs. ~ An adult should stay with the patient, preferably one trained in CPR. If the person is not trained in CPR, then he or she should provide hands-only (compression-only) CPR as recommended by the [employer].

## 2013-02-19 NOTE — Telephone Encounter (Signed)
Patient Information:  Caller Name: [name]  Phone: [phone]  Patient: Paula [name]  Gender: Female  DOB: September 16, 1927  Age: 79 Years  PCP: Rene Paci (Adults only)  Office Follow Up:  Does the office need to follow up with this patient?: No  Instructions For The Office: N/A  RN Note:  PT was seen at ED at 2am on 4-11 for vomiting, normal Cardiac work-up.  Pt is on Antibiotics for recent Bronchitis. Pt can hold down sips and urinating. FSBS 140 on 4-11. Pt concerned, has no idea why she is so nausated. Dr Mady Haagensen unavailable on 4-11.  Next available appt scheduled w/ Dr Yetta Barre on 4-11 at 11:15.  Pt verbalized understanding.   Symptoms  Reason For Call & Symptoms: ER CALL. Nausea and Vomiting  Reviewed Health History In EMR: N/A  Reviewed Medications In EMR: N/A  Reviewed Allergies In EMR: N/A  Reviewed Surgeries / Procedures: N/A  Date of Onset of Symptoms: 02/18/2013  Treatments Tried: anti-nausea med  Treatments Tried Worked: No  Guideline(s) Used:  Vomiting  Disposition Per Guideline:   See Today in Office  Reason For Disposition Reached:   Patient wants to be seen  Advice Given:  N/A  Patient Will Follow Care Advice:  YES  Appointment Scheduled:  02/19/2013 11:15:00 Appointment Scheduled Provider:  Sanda Linger (Adults only)

## 2013-02-19 NOTE — ED Notes (Addendum)
Patient c/o nausea starting 2 hours ago, patient with history of same, patient states also currently on z-pack for sore throat but does not believe it is working, patient denies vomiting/diarrhea,

## 2013-02-19 NOTE — Assessment & Plan Note (Signed)
This has been a chronic recurrent problem for her, may be worse today due to Zpak Will check a CT scan to look for a CNS lesion She will therefore stop the Zpak She will continue the current meds for this

## 2013-02-19 NOTE — Progress Notes (Signed)
Subjective:    Patient ID: Paula [name], female    DOB: 14-Oct-1927, 79 y.o.   MRN: [id number]  Headache  This is a new problem. The current episode started yesterday. The problem occurs intermittently. The problem has been unchanged. The pain is located in the right unilateral and parietal region. The pain does not radiate. The pain quality is not similar to prior headaches. The quality of the pain is described as throbbing. The pain is at a severity of 2/10. The pain is mild. Associated symptoms include a loss of balance, nausea, neck pain (right side of neck), a sore throat and weakness (she feels weak all over). Pertinent negatives include no abdominal pain, abnormal behavior, anorexia, back pain, blurred vision, coughing, dizziness, drainage, ear pain, eye pain, eye redness, eye watering, facial sweating, fever, hearing loss, insomnia, muscle aches, numbness, phonophobia, photophobia, rhinorrhea, scalp tenderness, seizures, sinus pressure, swollen glands, tingling, tinnitus, visual change, vomiting or weight loss. Nothing aggravates the symptoms. She has tried nothing for the symptoms. The treatment provided no relief. Her past medical history is significant for hypertension and obesity. There is no history of cancer, cluster headaches, immunosuppression, migraine headaches, migraines in the family, recent head traumas, sinus disease or TMJ.      Review of Systems  Constitutional: Negative for fever, chills, weight loss, diaphoresis, activity change, appetite change, fatigue and unexpected weight change.  HENT: Positive for sore throat and neck pain (right side of neck). Negative for hearing loss, ear pain, facial swelling, rhinorrhea, trouble swallowing, neck stiffness, voice change, sinus pressure and tinnitus.   Eyes: Negative.  Negative for blurred vision, photophobia, pain, discharge, redness, itching and visual disturbance.  Respiratory: Negative.  Negative for cough.   Cardiovascular:  Negative.   Gastrointestinal: Positive for nausea. Negative for vomiting, abdominal pain, diarrhea, constipation, abdominal distention, anal bleeding, rectal pain and anorexia.  Endocrine: Negative.   Genitourinary: Negative.   Musculoskeletal: Positive for gait problem (she feels off balanced). Negative for myalgias, back pain and joint swelling.  Skin: Negative.   Allergic/Immunologic: Negative.   Neurological: Positive for weakness (she feels weak all over), headaches and loss of balance. Negative for dizziness, tingling, tremors, seizures, syncope, facial asymmetry, speech difficulty, light-headedness and numbness.  Hematological: Negative.  Negative for adenopathy. Does not bruise/bleed easily.  Psychiatric/Behavioral: Negative.  The patient does not have insomnia.        Objective:   Physical Exam  Vitals reviewed. Constitutional: She is oriented to person, place, and time. She appears well-developed and well-nourished. No distress.  HENT:  Head: Normocephalic and atraumatic.  Mouth/Throat: Oropharynx is clear and moist. No oropharyngeal exudate.  Eyes: Conjunctivae and EOM are normal. Pupils are equal, round, and reactive to light. Right eye exhibits no discharge. Left eye exhibits no discharge. No scleral icterus.  Neck: Trachea normal and normal range of motion. Neck supple. Normal carotid pulses, no hepatojugular reflux and no JVD present. No spinous process tenderness and no muscular tenderness present. Carotid bruit is not present. No rigidity. No tracheal deviation, no edema, no erythema and normal range of motion present. No Brudzinski's sign and no Kernig's sign noted. No mass and no thyromegaly present.  Cardiovascular: Normal rate, regular rhythm, normal heart sounds and intact distal pulses.  Exam reveals no gallop and no friction rub.   No murmur heard. Pulmonary/Chest: Effort normal and breath sounds normal. No stridor. No respiratory distress. She has no wheezes. She has  no rales. She exhibits no tenderness.  Abdominal: Soft. Bowel  sounds are normal. She exhibits no distension and no mass. There is no tenderness. There is no rebound and no guarding.  Musculoskeletal: Normal range of motion. She exhibits no edema and no tenderness.  Lymphadenopathy:    She has no cervical adenopathy.  Neurological: She is alert and oriented to person, place, and time. She has normal strength. She displays no atrophy and no tremor. No cranial nerve deficit or sensory deficit. She exhibits normal muscle tone. She displays no seizure activity. Coordination and gait abnormal. She displays Babinski's sign on the right side. She displays no Babinski's sign on the left side.  Reflex Scores:      Tricep reflexes are 2+ on the right side and 2+ on the left side.      Bicep reflexes are 2+ on the right side and 2+ on the left side.      Brachioradialis reflexes are 1+ on the right side and 1+ on the left side.      Patellar reflexes are 1+ on the right side.      Achilles reflexes are 0 on the right side and 0 on the left side. Skin: Skin is warm and dry. No rash noted. She is not diaphoretic. No erythema. No pallor.  Psychiatric: Judgment and thought content normal. Her mood appears anxious. Her affect is angry. Her affect is not blunt, not labile and not inappropriate. Her speech is delayed and tangential. Her speech is not rapid and/or pressured and not slurred. She is not agitated, not aggressive, not hyperactive, not slowed, not withdrawn, not actively hallucinating and not combative. Thought content is not paranoid and not delusional. Cognition and memory are not impaired. She does not express impulsivity or inappropriate judgment. She exhibits a depressed mood. She expresses no homicidal and no suicidal ideation. She expresses no suicidal plans and no homicidal plans. She is communicative. She exhibits normal recent memory and normal remote memory. She is inattentive.     Lab Results   Component Value Date   WBC 9.1 02/10/2013   HGB 11.5* 02/10/2013   HCT 33.9* 02/10/2013   PLT 276.0 02/10/2013   GLUCOSE 267* 02/10/2013   CHOL 149 02/10/2013   TRIG 218.0* 02/10/2013   HDL 30.10* 02/10/2013   LDLDIRECT 90.6 02/10/2013   LDLCALC 80 11/04/2011   ALT 22 02/10/2013   AST 25 02/10/2013   NA 139 02/10/2013   K 3.1* 02/10/2013   CL 102 02/10/2013   CREATININE 0.8 02/10/2013   BUN 10 02/10/2013   CO2 27 02/10/2013   TSH 0.80 12/28/2012   INR 1.06 08/24/2012   HGBA1C 7.2* 02/10/2013       Assessment & Plan:

## 2013-02-19 NOTE — Assessment & Plan Note (Signed)
She has new onset HA with an abnormal neuro exam that does not fit any particular pattern. I will check her ESR today to see if she has TA or vasculitis I have also ordered a CT scan to see if there is a bleed, mass, CVA, etc

## 2013-02-19 NOTE — Patient Instructions (Signed)
General Headache Without Cause A headache is pain or discomfort felt around the head or neck area. The specific cause of a headache may not be found. There are many causes and types of headaches. A few common ones are:  Tension headaches.  Migraine headaches.  Cluster headaches.  Chronic daily headaches. HOME CARE INSTRUCTIONS   Keep all follow-up appointments with your caregiver or any specialist referral.  Only take over-the-counter or prescription medicines for pain or discomfort as directed by your caregiver.  Lie down in a dark, quiet room when you have a headache.  Keep a headache journal to find out what may trigger your migraine headaches. For example, write down:  What you eat and drink.  How much sleep you get.  Any change to your diet or medicines.  Try massage or other relaxation techniques.  Put ice packs or heat on the head and neck. Use these 3 to 4 times per day for 15 to 20 minutes each time, or as needed.  Limit stress.  Sit up straight, and do not tense your muscles.  Quit smoking if you smoke.  Limit alcohol use.  Decrease the amount of caffeine you drink, or stop drinking caffeine.  Eat and sleep on a regular schedule.  Get 7 to 9 hours of sleep, or as recommended by your caregiver.  Keep lights dim if bright lights bother you and make your headaches worse. SEEK MEDICAL CARE IF:   You have problems with the medicines you were prescribed.  Your medicines are not working.  You have a change from the usual headache.  You have nausea or vomiting. SEEK IMMEDIATE MEDICAL CARE IF:   Your headache becomes severe.  You have a fever.  You have a stiff neck.  You have loss of vision.  You have muscular weakness or loss of muscle control.  You start losing your balance or have trouble walking.  You feel faint or pass out.  You have severe symptoms that are different from your first symptoms. MAKE SURE YOU:   Understand these  instructions.  Will watch your condition.  Will get help right away if you are not doing well or get worse. Document Released: 10/28/2005 Document Revised: 01/20/2012 Document Reviewed: 11/13/2011 [employer] Patient Information 2013 [employer].  

## 2013-02-19 NOTE — ED Provider Notes (Signed)
History     CSN: [id number]  Arrival date & time 02/19/13  7829   None     Chief Complaint  Patient presents with  . Nausea    (Consider location/radiation/quality/duration/timing/severity/associated sxs/prior treatment) HPI Comments: Patient is an 79 year old female with a past medical history of diabetes, COPD, CHF, GERD, s/p AAA repair in feb 2013 who presents with nausea that started last night. Symptoms started gradually and progressively worsened since the onset. She reports taking Zofran before bed which provided relief. She woke up in the middle of the night with "severe nausea" and took another Zofran which provided no relief. Patient denies associated symptoms. No aggravating/alleviating factors. Patient denies chest pain, fever, vomiting, diarrhea, abdominal pain.    Past Medical History  Diagnosis Date  . Depression   . Ejection fraction     EF 40%, echo, November, 2012  / in improved, EF 60%, echo, December, 2012  . Contrast media allergy     Patient feels poorly with contrast  . Status post AAA (abdominal aortic aneurysm) repair     Surgical repair, Dr. Hart Rochester, December 27, 2011  . Pre-syncope     vasovagal w/ heart block  . Parotid mass     has refused further eval 10/2011  . GERD (gastroesophageal reflux disease)   . Anxiety   . Facial tic     L sided spasms - Botox trial summer 2013, ?effective  . Macular degeneration   . AAA (abdominal aortic aneurysm)     s/p repair 12/2011  . Congestive heart failure     NL EF 10/2011 echo   . Diabetes mellitus type II, controlled   . Hypothyroidism   . Sinus bradycardia 09/19/2011    Occurring simultaneously with complete heart block   . Hypertension   . COPD (chronic obstructive pulmonary disease)     Past Surgical History  Procedure Laterality Date  . Cholecystectomy    . Knee cartilage surgery      left  . Sinus surgery with instatrak    . Appendectomy    . Oophorectomy      ovarian cyst  . Cataract  extraction      bilateral  . Cardiac catheterization  11/04/11  . Bladder repair    . US echocardiography  10/14/11  . Abdominal aortic aneurysm repair  12/27/2011    Procedure: ANEURYSM ABDOMINAL AORTIC REPAIR;  Surgeon: Josephina Gip, MD;  Location: [facility] OR;  Service: Vascular;  Laterality: N/A;  Resection and Grafting Abdominal Aortic Aneurysm , Aorta Bi Iliac.    Family History  Problem Relation Age of Onset  . Esophageal cancer Father   . Breast cancer Sister   . Colon cancer Sister   . Heart disease Sister   . Heart disease Mother     History  Substance Use Topics  . Smoking status: Former Smoker -- 1.00 packs/day for 30 years    Types: Cigarettes    Quit date: 08/31/2011  . Smokeless tobacco: Never Used  . Alcohol Use: No    OB History   Grav Para Term Preterm Abortions TAB SAB Ect Mult Living                  Review of Systems  Gastrointestinal: Positive for nausea.  All other systems reviewed and are negative.    Allergies  Contrast media; Iohexol; Penicillins; Sulfa antibiotics; Avelox; and Ciprofloxacin  Home Medications   Current Outpatient Rx  Name  Route  Sig  Dispense  Refill  . albuterol (PROVENTIL HFA;VENTOLIN HFA) 108 (90 BASE) MCG/ACT inhaler   Inhalation   Inhale 2 puffs into the lungs every 6 (six) hours as needed for wheezing.   1 Inhaler   2   . ALPRAZolam (XANAX) 0.5 MG tablet   Oral   Take 0.5 mg by mouth 3 (three) times daily.          . Alum & Mag Hydroxide-Simeth (MAGIC MOUTHWASH W/LIDOCAINE) SOLN   Oral   Take 5 mLs by mouth 3 (three) times daily as needed.   120 mL   0   . aspirin EC 81 MG tablet   Oral   Take 81 mg by mouth every morning.          Marland Kitchen azithromycin (ZITHROMAX Z-PAK) 250 MG tablet      Take 2 tablets (500 mg) on  Day 1,  followed by 1 tablet (250 mg) once daily on Days 2 through 5.   6 each   0   . baclofen (LIORESAL) 10 MG tablet   Oral   Take 0.5 tablets (5 mg total) by mouth at bedtime.   30  each   3   . cetirizine (ZYRTEC) 10 MG tablet   Oral   Take 1 tablet (10 mg total) by mouth daily.   30 tablet   11   . furosemide (LASIX) 20 MG tablet   Oral   Take 1 tablet (20 mg total) by mouth daily.   30 tablet   6   . guaiFENesin (MUCINEX) 600 MG 12 hr tablet   Oral   Take 600 mg by mouth daily.          . Ipratropium-Albuterol (COMBIVENT) 20-100 MCG/ACT AERS respimat   Inhalation   Inhale 1 puff into the lungs every 6 (six) hours.   4 g   0   . levothyroxine (SYNTHROID, LEVOTHROID) 175 MCG tablet   Oral   Take 175 mcg by mouth daily.         Marland Kitchen losartan-hydrochlorothiazide (HYZAAR) 50-12.5 MG per tablet   Oral   Take 1 tablet by mouth daily.   90 tablet   3   . meloxicam (MOBIC) 7.5 MG tablet   Oral   Take 1 tablet (7.5 mg total) by mouth daily.   30 tablet   3   . metFORMIN (GLUCOPHAGE XR) 500 MG 24 hr tablet   Oral   Take 1 tablet (500 mg total) by mouth daily with breakfast.   90 tablet   3   . ondansetron (ZOFRAN) 4 MG tablet   Oral   Take 1 tablet (4 mg total) by mouth every 8 (eight) hours as needed for nausea.   20 tablet   1   . pantoprazole (PROTONIX) 40 MG tablet   Oral   Take 1 tablet (40 mg total) by mouth daily.   30 tablet   5   . Potassium Chloride ER (K-TABS) 20 MEQ TBCR   Oral   Take 20 mEq by mouth daily.   30 tablet   6   . sertraline (ZOLOFT) 50 MG tablet   Oral   Take 1 tablet (50 mg total) by mouth daily.   90 tablet   1     BP 144/66  Temp(Src) 98.3 F (36.8 C) (Oral)  Resp 18  SpO2 93%  Physical Exam  Nursing note and vitals reviewed. Constitutional: She is oriented to person, place, and time. She appears well-developed and well-nourished.  No distress.  HENT:  Head: Normocephalic and atraumatic.  Eyes: Conjunctivae and EOM are normal.  Neck: Normal range of motion.  Cardiovascular: Normal rate and regular rhythm.  Exam reveals no gallop and no friction rub.   No murmur heard. Pulmonary/Chest:  Effort normal and breath sounds normal. She has no wheezes. She has no rales. She exhibits no tenderness.  Abdominal: Soft. She exhibits no distension. There is no tenderness. There is no rebound and no guarding.  Musculoskeletal: Normal range of motion.  Neurological: She is alert and oriented to person, place, and time.  Speech is goal-oriented. Moves limbs without ataxia.   Skin: Skin is warm and dry.  Psychiatric: She has a normal mood and affect. Her behavior is normal.    ED Course  Procedures (including critical care time)   Date: 02/19/2013  Rate: 89  Rhythm: normal sinus rhythm  QRS Axis: normal  Intervals: QT prolonged  ST/T Wave abnormalities: normal  Conduction Disutrbances:first-degree A-V block   Narrative Interpretation: NSR with 1st degree AV block unchanged from previous  Old EKG Reviewed: unchanged    Labs Reviewed  URINALYSIS, ROUTINE W REFLEX MICROSCOPIC - Abnormal; Notable for the following:    Leukocytes, UA TRACE (*)    All other components within normal limits  GLUCOSE, CAPILLARY - Abnormal; Notable for the following:    Glucose-Capillary 140 (*)    All other components within normal limits  URINE CULTURE  URINE MICROSCOPIC-ADD ON   No results found.   1. Nausea       MDM  6:55 AM Urinalysis and CBG pending. Patient will have reglan for nausea.   7:41 AM Urinalysis shows no infection.  8:31 AM  EKG unremarkable. Patient will be discharged with a prescription for Reglan. Patient instructed to follow up with her PCP and return to the ED with worsening or concerning symptoms. Vitals stable and patient afebrile. No further evaluation needed at this time.    Emilia Beck, [place] 02/19/13 [phone]

## 2013-02-19 NOTE — ED Provider Notes (Signed)
Medical screening examination/treatment/procedure(s) were conducted as a shared visit with non-physician practitioner(s) and myself.  I personally evaluated the patient during the encounter  The patient is awake and alert no acute distress. Abdomen is soft and nontender.   Hanley Seamen, MD 02/19/13 [phone]

## 2013-02-19 NOTE — Assessment & Plan Note (Signed)
Her BP is well controlled today 

## 2013-02-20 LAB — URINE CULTURE: Special Requests: NORMAL

## 2013-02-20 NOTE — ED Provider Notes (Deleted)
Medical screening examination/treatment/procedure(s) were performed by non-physician practitioner and as supervising physician I was immediately available for consultation/collaboration.   Hanley Seamen, MD 02/20/13 [phone]

## 2013-02-22 ENCOUNTER — Other Ambulatory Visit: Payer: Self-pay | Admitting: Internal Medicine

## 2013-02-25 ENCOUNTER — Encounter: Payer: Self-pay | Admitting: Neurology

## 2013-02-27 ENCOUNTER — Ambulatory Visit (INDEPENDENT_AMBULATORY_CARE_PROVIDER_SITE_OTHER): Payer: Medicare Other | Admitting: Family Medicine

## 2013-02-27 VITALS — BP 122/66 | HR 84 | Temp 98.5°F | Wt 175.2 lb

## 2013-02-27 DIAGNOSIS — I5032 Chronic diastolic (congestive) heart failure: Secondary | ICD-10-CM

## 2013-02-27 DIAGNOSIS — J439 Emphysema, unspecified: Secondary | ICD-10-CM

## 2013-02-27 DIAGNOSIS — I509 Heart failure, unspecified: Secondary | ICD-10-CM

## 2013-02-27 DIAGNOSIS — J438 Other emphysema: Secondary | ICD-10-CM

## 2013-02-27 DIAGNOSIS — J069 Acute upper respiratory infection, unspecified: Secondary | ICD-10-CM

## 2013-02-27 NOTE — Assessment & Plan Note (Signed)
Most likely causing some of her more acute symptoms. No sign of bacterial infection seen.  Total visit time 30 minutes, > 50% spent counseling and cordinating patients care. ** Pt with many chronic issues.. Difficult to tease out why she was in the Saturday clinic**

## 2013-02-27 NOTE — Assessment & Plan Note (Signed)
Euvolemic. 

## 2013-02-27 NOTE — Progress Notes (Signed)
Subjective:    Patient ID: Paula [name], female    DOB: [birth year], 79 y.o.   MRN: [id number]  HPI   79 year old female with DM, COPD, CHF, recurrent PNA ( last episode 10/2012) presents with multiple chronic symptoms. Sore throat, swelling on outside of throat. Off and today she has noted some SOB and wheeze. None now. Using zyrtec for allergies.  She made appt to be seen today because she awoke feel "just bad"   She is not sure why she is here, she always has chrnic issues... She always feels sick off and on ... Nausea, headache, ST, congestion cough off and on at times.  No edema on lasix  She denies significant depressive symptoms.  Seen on 4/4,4/8, 4/11  As well as in ER in last month. Treated with azithromycin for  Bronchtitis.Marland Kitchen Helped some.. She felt better.  Has Dr. Vassie Loll in 03/2013.    Review of Systems  Constitutional: Positive for fatigue.  HENT: Positive for ear pain, congestion, rhinorrhea, sneezing and postnasal drip.   Eyes: Negative for pain and redness.  Respiratory: Positive for cough and shortness of breath. Negative for chest tightness and wheezing.   Cardiovascular: Negative for chest pain.  Gastrointestinal: Positive for nausea, abdominal pain and abdominal distention. Negative for vomiting, diarrhea and constipation.  Genitourinary: Negative for dysuria and urgency.  Musculoskeletal: Positive for myalgias, back pain and arthralgias.  Neurological: Positive for weakness, light-headedness and headaches. Negative for numbness.       Objective:   Physical Exam  Vitals reviewed. Constitutional: She is oriented to person, place, and time. Vital signs are normal. She appears well-developed and well-nourished. She is cooperative.  Non-toxic appearance. She does not appear ill. No distress.  HENT:  Head: Normocephalic and atraumatic.  Right Ear: Hearing, tympanic membrane, external ear and ear canal normal. Tympanic membrane is not erythematous, not retracted  and not bulging.  Left Ear: Hearing, tympanic membrane, external ear and ear canal normal. Tympanic membrane is not erythematous, not retracted and not bulging.  Nose: No mucosal edema or rhinorrhea. Right sinus exhibits no maxillary sinus tenderness and no frontal sinus tenderness. Left sinus exhibits no maxillary sinus tenderness and no frontal sinus tenderness.  Mouth/Throat: Uvula is midline, oropharynx is clear and moist and mucous membranes are normal. No oropharyngeal exudate.  Eyes: Conjunctivae, EOM and lids are normal. Pupils are equal, round, and reactive to light. No foreign bodies found. Right eye exhibits no discharge. Left eye exhibits no discharge. No scleral icterus.  Neck: Trachea normal and normal range of motion. Neck supple. Normal carotid pulses, no hepatojugular reflux and no JVD present. No spinous process tenderness and no muscular tenderness present. Carotid bruit is not present. No rigidity. No tracheal deviation, no edema, no erythema and normal range of motion present. No Brudzinski's sign and no Kernig's sign noted. No mass and no thyromegaly present.  Cardiovascular: Normal rate, regular rhythm, S1 normal, S2 normal, normal heart sounds, intact distal pulses and normal pulses.  Exam reveals no gallop and no friction rub.   No murmur heard. Pulmonary/Chest: Effort normal and breath sounds normal. No stridor. Not tachypneic. No respiratory distress. She has no decreased breath sounds. She has no wheezes. She has no rhonchi. She has no rales. She exhibits no tenderness.  Abdominal: Soft. Normal appearance and bowel sounds are normal. She exhibits no distension and no mass. There is no tenderness. There is no rebound and no guarding.  Musculoskeletal: Normal range of motion. She exhibits  no edema and no tenderness.  Lymphadenopathy:    She has no cervical adenopathy.  Neurological: She is alert and oriented to person, place, and time. She has normal strength. She displays no  atrophy and no tremor. No cranial nerve deficit or sensory deficit. She exhibits normal muscle tone. She displays no seizure activity. Gait abnormal. She displays Babinski's sign on the right side. She displays no Babinski's sign on the left side.  Reflex Scores:      Tricep reflexes are 2+ on the right side and 2+ on the left side.      Bicep reflexes are 2+ on the right side and 2+ on the left side.      Brachioradialis reflexes are 1+ on the right side and 1+ on the left side.      Patellar reflexes are 1+ on the right side.      Achilles reflexes are 0 on the right side and 0 on the left side. Skin: Skin is warm, dry and intact. No rash noted. She is not diaphoretic. No erythema. No pallor.  Psychiatric: Her speech is normal and behavior is normal. Judgment and thought content normal. Her mood appears not anxious. Her affect is angry. Her affect is not blunt, not labile and not inappropriate. She is not agitated, not aggressive, not hyperactive, not slowed, not withdrawn, not actively hallucinating and not combative. Thought content is not paranoid and not delusional. Cognition and memory are normal. Cognition and memory are not impaired. She does not express impulsivity or inappropriate judgment. She does not exhibit a depressed mood. She expresses no homicidal and no suicidal ideation. She expresses no suicidal plans and no homicidal plans. She exhibits normal recent memory and normal remote memory.          Assessment & Plan:

## 2013-02-27 NOTE — Assessment & Plan Note (Signed)
-   No current exacerbation. 

## 2013-02-27 NOTE — Patient Instructions (Addendum)
No specific bacterial infection seen. No clear current COPD or CHF flare. Likely viral upper respiratory infection. Can use mucinex DM for congestion and cough.  Can use lactobaccili (probitoic) for abdominal bloating and gas. Follow up as needed with primary MD.

## 2013-03-01 ENCOUNTER — Telehealth: Payer: Self-pay | Admitting: *Deleted

## 2013-03-01 ENCOUNTER — Telehealth: Payer: Self-pay | Admitting: Internal Medicine

## 2013-03-01 MED ORDER — METFORMIN HCL ER 500 MG PO TB24: 500.0000 mg | ORAL_TABLET | Freq: Every day | ORAL | Status: DC

## 2013-03-01 NOTE — Telephone Encounter (Signed)
[facility] Triage Call Report Triage Record Num: [id number] Operator: Meribeth Mattes Patient Name: Quintella Mura Call Date & Time: 02/27/2013 7:15:48AM Patient Phone: [phone] PCP: Rene Paci Patient Gender: Female PCP Fax : [phone] Patient DOB: [birth year] [facility] Name[facility] Reason for Call: Caller: [name]; PCP: Rene Paci (Adults only); CB#: [phone]; Call regarding Sore Throat; has had sore throat longer than 1 week, finished up antibioitic and still has sore throat, afebrile, no diff breathing, taking fluids ok, but hurts to swallow Triage per Sore Throat Protocol. See in 4 hour disposition. Appt scheduled 02/27/13 1100. Call back if sx worsens. Protocol(s) Used: Sore Throat or Hoarseness Recommended Outcome per Protocol: See Provider within 4 hours Reason for Outcome: Marked difficulty swallowing due to sore throat unresponsive to 12 hours of home care Coughed out foreign object after choking episode and NO further choking symptoms (can now talk, no coughing, gasping, or wheezing) Care Advice: ~ Call provider immediately if develop persistent cough or wheezing

## 2013-03-01 NOTE — Telephone Encounter (Signed)
[facility] Triage Call Report Triage Record Num: [id number] Operator: Thayer Headings Patient Name: Paula [name] Call Date & Time: 02/26/2013 6:02:55PM Patient Phone: [phone] PCP: Rene Paci Patient Gender: Female PCP Fax : [phone] Patient DOB: 10/31/1927 [facility] Name[facility] Reason for Call: Caller: [name]; PCP: Rene Paci (Adults only); CB#: [phone]; Calling today 02/26/13 regarding her left side facial twitching and numbness; onset today. Pt gets on the phone and said she has had repeated episodes of this happening and she goes to the ED and they can't find anything wrong with her. Emergent symptoms r/o by Neurological Deficits guidelines with exception of new numbness, weakness or paralysis involving face, arm or leg, especially on same side of body, occurring now or within the last 4 hours. (Activate EMS 911). Care advice given. Pt refuses to call 911, refuses to go to ED. York Spaniel will follow up with Dr. Felicity Coyer on Monday. Protocol(s) Used: Neurological Deficits Recommended Outcome per Protocol: Activate EMS 911 Reason for Outcome: New numbness, weakness or paralysis involving face, arm or leg, especially on same side of body, occurring now or within last 4 hours Care Advice: ~ Do not give the patient anything to eat or drink. ~ IMMEDIATE ACTION Write down provider's name. List or place the following in a bag for transport with the patient: current prescription and/or nonprescription medications; alternative treatments, therapies and medications; and street drugs.

## 2013-03-01 NOTE — Telephone Encounter (Signed)
Husband states fail to ask for refill on wife alprazolam at last visit. Requesting a 90 supply sent to [facility]...lmb

## 2013-03-02 ENCOUNTER — Other Ambulatory Visit (INDEPENDENT_AMBULATORY_CARE_PROVIDER_SITE_OTHER): Payer: Medicare Other

## 2013-03-02 ENCOUNTER — Encounter: Payer: Self-pay | Admitting: Internal Medicine

## 2013-03-02 ENCOUNTER — Ambulatory Visit (INDEPENDENT_AMBULATORY_CARE_PROVIDER_SITE_OTHER): Payer: Medicare Other | Admitting: Internal Medicine

## 2013-03-02 VITALS — BP 130/72 | HR 80 | Temp 99.1°F | Wt 174.8 lb

## 2013-03-02 DIAGNOSIS — R300 Dysuria: Secondary | ICD-10-CM

## 2013-03-02 DIAGNOSIS — F411 Generalized anxiety disorder: Secondary | ICD-10-CM

## 2013-03-02 DIAGNOSIS — R110 Nausea: Secondary | ICD-10-CM

## 2013-03-02 DIAGNOSIS — F419 Anxiety disorder, unspecified: Secondary | ICD-10-CM

## 2013-03-02 LAB — URINALYSIS, ROUTINE W REFLEX MICROSCOPIC
Bilirubin Urine: NEGATIVE
Hgb urine dipstick: NEGATIVE
Ketones, ur: NEGATIVE
Nitrite: NEGATIVE
Specific Gravity, Urine: 1.01
Total Protein, Urine: NEGATIVE
Urine Glucose: NEGATIVE
Urobilinogen, UA: 0.2
pH: 5.5 (ref 5.0–8.0)

## 2013-03-02 MED ORDER — TRIMETHOPRIM 100 MG PO TABS: 100.0000 mg | ORAL_TABLET | Freq: Two times a day (BID) | ORAL | Status: DC

## 2013-03-02 MED ORDER — ALPRAZOLAM 1 MG PO TABS: 1.0000 mg | ORAL_TABLET | Freq: Three times a day (TID) | ORAL | Status: DC

## 2013-03-02 MED ORDER — ALPRAZOLAM 0.5 MG PO TABS: 0.5000 mg | ORAL_TABLET | Freq: Three times a day (TID) | ORAL | Status: DC

## 2013-03-02 NOTE — Patient Instructions (Signed)
It was good to see you today. Test(s) ordered today. Your results will be released to MyChart (or called to you) after review, usually within 72hours after test completion. If any changes need to be made, you will be notified at that same time. Use antibiotic twice daily for bladder symptoms and increase Xanax to 1 mg 3 times daily Other medications reviewed and updated, no other changes recommended Your prescription(s) have been submitted to your pharmacy. Please take as directed and contact our office if you believe you are having problem(s) with the medication(s).

## 2013-03-02 NOTE — Progress Notes (Signed)
Subjective:    Patient ID: Paula [name], female    DOB: [birth year], 79 y.o.   MRN: [id number]  HPI  complains of nausea  multiple visits here, ER and pulm for same in recent weeks/months for her chronic cough and shortness of breath -  Interval history reviewed including numerous recent office visits and overnight hospitalization 12/2012 for same   Past Medical History  Diagnosis Date  . Depression   . Ejection fraction     EF 40%, echo, November, 2012  / in improved, EF 60%, echo, December, 2012  . Contrast media allergy     Patient feels poorly with contrast  . Status post AAA (abdominal aortic aneurysm) repair     Surgical repair, Dr. Hart Rochester, December 27, 2011  . Pre-syncope     vasovagal w/ heart block  . Parotid mass     has refused further eval 10/2011  . GERD (gastroesophageal reflux disease)   . Anxiety   . Facial tic     L sided spasms - Botox trial summer 2013, ?effective  . Macular degeneration   . AAA (abdominal aortic aneurysm)     s/p repair 12/2011  . Congestive heart failure     NL EF 10/2011 echo   . Diabetes mellitus type II, controlled   . Hypothyroidism   . Sinus bradycardia 09/19/2011    Occurring simultaneously with complete heart block   . Hypertension   . COPD (chronic obstructive pulmonary disease)   . Abdominal spasms     hemifacial-treated w/ botox xeomin -Dr Terrace Arabia  . Confusion 11/02/12    hospitalized for 10 days,amnestic fo rthe stay and did not recognize her home when husband brought her home.  . Sinus disorder tested 11/03/11    syndrome  . Syncope and collapse     Review of Systems  HENT: Positive for hoarse voice. Negative for congestion, sneezing, neck pain and ear discharge.   Respiratory: Positive for cough (chronic) and shortness of breath.   Cardiovascular: Negative for chest pain, palpitations and leg swelling.  Gastrointestinal: Positive for nausea. Negative for vomiting, abdominal pain, diarrhea and constipation.   Genitourinary: Positive for dysuria and urgency. Negative for frequency, flank pain and difficulty urinating.  Neurological: Negative for headaches.  Psychiatric/Behavioral: Negative for sleep disturbance and dysphoric mood.       Objective:   Physical Exam  BP 130/72  Pulse 80  Temp(Src) 99.1 F (37.3 C) (Oral)  Wt 174 lb 12.8 oz (79.289 kg)  BMI 29.09 kg/m2  SpO2 91% Wt Readings from Last 3 Encounters:  03/02/13 174 lb 12.8 oz (79.289 kg)  02/27/13 175 lb 4 oz (79.493 kg)  02/25/13 173 lb (78.472 kg)   Gen: NAD, nontoxic. hoarse without change. Spouse at side HENT: sinuses nontender. Tympanic membranes clear bilaterally without effusion. Oropharynx with erythema and mild exudate on the right side. Nares without discharge or turbinate swelling Lungs: CTA B - no wheeze or crackles; diminished air movement at bases CV: RRR, no edema BLE Abd: SNTND+BS, no R/G Psychiatric: tangential    Lab Results  Component Value Date   WBC 9.1 02/10/2013   HGB 11.5* 02/10/2013   HCT 33.9* 02/10/2013   PLT 276.0 02/10/2013   GLUCOSE 267* 02/10/2013   CHOL 149 02/10/2013   TRIG 218.0* 02/10/2013   HDL 30.10* 02/10/2013   LDLDIRECT 90.6 02/10/2013   LDLCALC 80 11/04/2011   ALT 22 02/10/2013   AST 25 02/10/2013   NA 139 02/10/2013   K 3.1*  02/10/2013   CL 102 02/10/2013   CREATININE 0.8 02/10/2013   BUN 10 02/10/2013   CO2 27 02/10/2013   TSH 0.80 12/28/2012   INR 1.06 08/24/2012   HGBA1C 7.2* 02/10/2013   11/02/12 ct chest w/o CM IMPRESSION:   1.  Loculated left pleural effusion with suspected rounded atelectasis in the left lower lobe.  No specific findings for intrathoracic malignancy identified.  If neoplasm is suspected, thoracentesis should be considered.  Otherwise, follow-up chest CT in 41-month suggested to assess stability. 2.  Evidence of prior granulomatous disease with calcified left lower lobe granuloma, calcified left hilar lymph nodes and calcified granulomas in the liver and spleen. 3.   Diffuse atherosclerosis. 4.  Cannot exclude a mesenteric mass inferior to the pancreatic tail, incompletely visualized by this examination of the chest. Further evaluation with abdominal CT to include at least oral contrast recommended.  11/10/12 CT a/p w/CM IMPRESSION:   1.  The recently demonstrated mass in the mid abdomen is well circumscribed with low density and is inferior to the pancreatic body and tail.  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 2.  Interval successful repair of the abdominal aortic aneurysm. 3.  Stable postoperative appearance of the anterior abdominal wall with small left inguinal hernia containing only fat. 4.  Stable loculated left pleural effusion and suspected left lower lobe rounded atelectasis compared with recent chest CT.  CXR 01/02/13: IMPRESSION: 1.  The appearance of the chest, as above, is suggestive of mild congestive heart failure. 2.  Bibasilar subsegmental atelectasis and small bilateral pleural effusions. 3.  Atherosclerosis.       Assessment & Plan:   See problem list. Medications and labs reviewed today.  Nausea - abd exam benign and unrelated to meals Dysuria - check UA and tx empirically for UTI, esp with LGF and symptoms

## 2013-03-02 NOTE — Assessment & Plan Note (Signed)
Significant component of anxiety evident on exam (spouse and pt), likely contributing to symptoms Review and reassurance provided to patient and spouse today with apparently little impact Continue same sertraline dose and increase TID BZ dose Support provided at length today including review of her hospitalization and surgery fall 2013

## 2013-03-02 NOTE — Telephone Encounter (Signed)
Ok to fill   thanks

## 2013-03-02 NOTE — Telephone Encounter (Signed)
F[facility]ed script back [facility].../lmb 

## 2013-03-03 ENCOUNTER — Ambulatory Visit (INDEPENDENT_AMBULATORY_CARE_PROVIDER_SITE_OTHER): Payer: Medicare Other | Admitting: Psychology

## 2013-03-03 ENCOUNTER — Other Ambulatory Visit: Payer: Self-pay

## 2013-03-03 DIAGNOSIS — F4320 Adjustment disorder, unspecified: Secondary | ICD-10-CM

## 2013-03-03 MED ORDER — FUROSEMIDE 20 MG PO TABS: 20.0000 mg | ORAL_TABLET | Freq: Every day | ORAL | Status: DC

## 2013-03-03 MED ORDER — POTASSIUM CHLORIDE ER 20 MEQ PO TBCR: 20.0000 meq | EXTENDED_RELEASE_TABLET | Freq: Every day | ORAL | Status: DC

## 2013-03-03 NOTE — Telephone Encounter (Signed)
Patient Instructions    Your physician has recommended you make the following change in your medication: START Lasix 20 mg daily, INCREASE your Potassium (K+) to 20 meq daily.  Watch the amount of salt you eat  Limit the extra water your drink at night  If you have a total weight gain of 5# or more total, call your primary physician If your total weight decreases by 10#, stop your lasix  Your physician wants you to follow-up in: 6 months.   You will receive a reminder letter in the mail two months in advance. If you don't receive a letter, please call our office to schedule the follow-up appointment.    Patient Instructions History Recorded      Provider Department Encounter #    02/10/2013  9:00 AM Willa Rough, MD [facility] [id number]

## 2013-03-04 ENCOUNTER — Emergency Department (HOSPITAL_COMMUNITY): Payer: Medicare Other

## 2013-03-04 ENCOUNTER — Emergency Department (HOSPITAL_COMMUNITY)
Admission: EM | Admit: 2013-03-04 | Discharge: 2013-03-05 | Disposition: A | Discharge status: Home / Self Care | Payer: Medicare Other | Attending: Emergency Medicine | Admitting: Emergency Medicine

## 2013-03-04 ENCOUNTER — Encounter: Payer: Self-pay | Admitting: Internal Medicine

## 2013-03-04 ENCOUNTER — Ambulatory Visit (INDEPENDENT_AMBULATORY_CARE_PROVIDER_SITE_OTHER): Payer: Medicare Other | Admitting: Internal Medicine

## 2013-03-04 ENCOUNTER — Encounter (HOSPITAL_COMMUNITY): Payer: Self-pay | Admitting: Emergency Medicine

## 2013-03-04 VITALS — BP 140/90 | HR 80 | Temp 98.5°F | Wt 174.0 lb

## 2013-03-04 DIAGNOSIS — J449 Chronic obstructive pulmonary disease, unspecified: Secondary | ICD-10-CM | POA: Insufficient documentation

## 2013-03-04 DIAGNOSIS — K219 Gastro-esophageal reflux disease without esophagitis: Secondary | ICD-10-CM | POA: Insufficient documentation

## 2013-03-04 DIAGNOSIS — H3530 Unspecified macular degeneration: Secondary | ICD-10-CM | POA: Insufficient documentation

## 2013-03-04 DIAGNOSIS — R531 Weakness: Secondary | ICD-10-CM

## 2013-03-04 DIAGNOSIS — R220 Localized swelling, mass and lump, head: Secondary | ICD-10-CM

## 2013-03-04 DIAGNOSIS — Z87891 Personal history of nicotine dependence: Secondary | ICD-10-CM | POA: Insufficient documentation

## 2013-03-04 DIAGNOSIS — F3289 Other specified depressive episodes: Secondary | ICD-10-CM | POA: Insufficient documentation

## 2013-03-04 DIAGNOSIS — R221 Localized swelling, mass and lump, neck: Secondary | ICD-10-CM | POA: Insufficient documentation

## 2013-03-04 DIAGNOSIS — Z7982 Long term (current) use of aspirin: Secondary | ICD-10-CM | POA: Insufficient documentation

## 2013-03-04 DIAGNOSIS — J4489 Other specified chronic obstructive pulmonary disease: Secondary | ICD-10-CM | POA: Insufficient documentation

## 2013-03-04 DIAGNOSIS — Z8679 Personal history of other diseases of the circulatory system: Secondary | ICD-10-CM | POA: Insufficient documentation

## 2013-03-04 DIAGNOSIS — R5381 Other malaise: Secondary | ICD-10-CM | POA: Insufficient documentation

## 2013-03-04 DIAGNOSIS — I10 Essential (primary) hypertension: Secondary | ICD-10-CM | POA: Insufficient documentation

## 2013-03-04 DIAGNOSIS — J439 Emphysema, unspecified: Secondary | ICD-10-CM

## 2013-03-04 DIAGNOSIS — F419 Anxiety disorder, unspecified: Secondary | ICD-10-CM

## 2013-03-04 DIAGNOSIS — E119 Type 2 diabetes mellitus without complications: Secondary | ICD-10-CM | POA: Insufficient documentation

## 2013-03-04 DIAGNOSIS — J438 Other emphysema: Secondary | ICD-10-CM

## 2013-03-04 DIAGNOSIS — F411 Generalized anxiety disorder: Secondary | ICD-10-CM

## 2013-03-04 DIAGNOSIS — R5383 Other fatigue: Secondary | ICD-10-CM | POA: Insufficient documentation

## 2013-03-04 DIAGNOSIS — K3189 Other diseases of stomach and duodenum: Secondary | ICD-10-CM | POA: Insufficient documentation

## 2013-03-04 DIAGNOSIS — E039 Hypothyroidism, unspecified: Secondary | ICD-10-CM | POA: Insufficient documentation

## 2013-03-04 DIAGNOSIS — I509 Heart failure, unspecified: Secondary | ICD-10-CM | POA: Insufficient documentation

## 2013-03-04 DIAGNOSIS — G518 Other disorders of facial nerve: Secondary | ICD-10-CM | POA: Insufficient documentation

## 2013-03-04 DIAGNOSIS — F329 Major depressive disorder, single episode, unspecified: Secondary | ICD-10-CM | POA: Insufficient documentation

## 2013-03-04 DIAGNOSIS — R110 Nausea: Secondary | ICD-10-CM | POA: Insufficient documentation

## 2013-03-04 DIAGNOSIS — Z79899 Other long term (current) drug therapy: Secondary | ICD-10-CM | POA: Insufficient documentation

## 2013-03-04 LAB — CBC WITH DIFFERENTIAL/PLATELET
Basophils Absolute: 0.1 10*3/uL (ref 0.0–0.1)
HCT: 34.5 % — ABNORMAL LOW (ref 36.0–46.0)
Hemoglobin: 11.4 g/dL — ABNORMAL LOW (ref 12.0–15.0)
Lymphocytes Relative: 19 % (ref 12–46)
Monocytes Absolute: 0.8 10*3/uL (ref 0.1–1.0)
Neutro Abs: 8.9 10*3/uL — ABNORMAL HIGH (ref 1.7–7.7)
Neutrophils Relative %: 72 % (ref 43–77)
RDW: 13.3 % (ref 11.5–15.5)
WBC: 12.4 10*3/uL — ABNORMAL HIGH (ref 4.0–10.5)

## 2013-03-04 LAB — URINALYSIS, ROUTINE W REFLEX MICROSCOPIC
Bilirubin Urine: NEGATIVE
Hgb urine dipstick: NEGATIVE
Ketones, ur: NEGATIVE mg/dL
Protein, ur: NEGATIVE mg/dL
Urobilinogen, UA: 1 mg/dL (ref 0.0–1.0)

## 2013-03-04 LAB — COMPREHENSIVE METABOLIC PANEL
ALT: 20 U/L (ref 0–35)
AST: 20 U/L (ref 0–37)
Alkaline Phosphatase: 49 U/L (ref 39–117)
CO2: 26 mEq/L (ref 19–32)
Chloride: 101 mEq/L (ref 96–112)
Creatinine, Ser: 1.09 mg/dL (ref 0.50–1.10)
GFR calc non Af Amer: 45 mL/min — ABNORMAL LOW (ref >90)
Potassium: 3.9 mEq/L (ref 3.5–5.1)
Total Bilirubin: 0.5 mg/dL (ref 0.3–1.2)

## 2013-03-04 MED ORDER — SODIUM CHLORIDE 0.9 % IV BOLUS (SEPSIS)
500.0000 mL | Freq: Once | INTRAVENOUS | Status: AC
  Administered 2013-03-04: 500 mL via INTRAVENOUS

## 2013-03-04 MED ORDER — ONDANSETRON HCL 4 MG/2ML IJ SOLN
4.0000 mg | Freq: Once | INTRAMUSCULAR | Status: AC
  Administered 2013-03-04: 4 mg via INTRAVENOUS
  Filled 2013-03-04: qty 2

## 2013-03-04 MED ORDER — METHYLPREDNISOLONE ACETATE 80 MG/ML IJ SUSP: 80.0000 mg | Freq: Once | INTRAMUSCULAR | Status: DC

## 2013-03-04 MED ORDER — LORAZEPAM 2 MG/ML IJ SOLN
0.5000 mg | Freq: Once | INTRAMUSCULAR | Status: AC
  Administered 2013-03-04: 0.5 mg via INTRAVENOUS
  Filled 2013-03-04: qty 1

## 2013-03-04 NOTE — ED Provider Notes (Signed)
History     CSN: [id number]  Arrival date & time 03/04/13  1730   First MD Initiated Contact with Patient 03/04/13 1756      Chief Complaint  Patient presents with  . Weakness    (Consider location/radiation/quality/duration/timing/severity/associated sxs/prior treatment) HPI Patient complaining of nausea that began this a.m. States just feels "sick".  Also complains of left side of face twitching which she states she has seen neurology for and takes baclofen.  She denies any lateralized arm, leg weakness.  No visual changes.  Walking as usual with walker.  Patient here with husband with whom she lives.  Patient's pmd is Dr. Felicity Coyer.  She denies chest pain, dyspnea, abdominal pain, uti symptoms, or rash.  Nausea has occurred before   Past Medical History  Diagnosis Date  . Depression   . Ejection fraction     EF 40%, echo, November, 2012  / in improved, EF 60%, echo, December, 2012  . Contrast media allergy     Patient feels poorly with contrast  . Status post AAA (abdominal aortic aneurysm) repair     Surgical repair, Dr. Hart Rochester, December 27, 2011  . Pre-syncope     vasovagal w/ heart block  . Parotid mass     has refused further eval 10/2011  . GERD (gastroesophageal reflux disease)   . Anxiety   . Facial tic     L sided spasms - Botox trial summer 2013, ?effective  . Macular degeneration   . AAA (abdominal aortic aneurysm)     s/p repair 12/2011  . Congestive heart failure     NL EF 10/2011 echo   . Diabetes mellitus type II, controlled   . Hypothyroidism   . Sinus bradycardia 09/19/2011    Occurring simultaneously with complete heart block   . Hypertension   . COPD (chronic obstructive pulmonary disease)   . Abdominal spasms     hemifacial-treated w/ botox xeomin -Dr Terrace Arabia  . Confusion 11/02/12    hospitalized for 10 days,amnestic fo rthe stay and did not recognize her home when husband brought her home.  . Sinus disorder tested 11/03/11    syndrome  . Syncope and  collapse     Past Surgical History  Procedure Laterality Date  . Cholecystectomy    . Knee cartilage surgery      left  . Sinus surgery with instatrak    . Appendectomy    . Oophorectomy      ovarian cyst  . Cataract extraction      bilateral  . Cardiac catheterization  11/04/11  . Bladder repair    . US echocardiography  10/14/11  . Abdominal aortic aneurysm repair  12/27/2011    Procedure: ANEURYSM ABDOMINAL AORTIC REPAIR;  Surgeon: Josephina Gip, MD;  Location: [facility] OR;  Service: Vascular;  Laterality: N/A;  Resection and Grafting Abdominal Aortic Aneurysm , Aorta Bi Iliac.  Marland Kitchen Pacemaker insertion  11/12    Family History  Problem Relation Age of Onset  . Esophageal cancer Father   . Cancer Father   . Breast cancer Sister   . Cancer Sister   . Colon cancer Sister   . Heart disease Sister   . Heart disease Mother     History  Substance Use Topics  . Smoking status: Former Smoker -- 1.00 packs/day for 30 years    Types: Cigarettes    Quit date: 08/31/2011  . Smokeless tobacco: Never Used  . Alcohol Use: No    OB History  Grav Para Term Preterm Abortions TAB SAB Ect Mult Living                  Review of Systems  Allergies  Bactrim; Contrast media; Gadolinium derivatives; Iohexol; Penicillins; Sulfa antibiotics; Avelox; and Ciprofloxacin  Home Medications   Current Outpatient Rx  Name  Route  Sig  Dispense  Refill  . albuterol (PROVENTIL HFA;VENTOLIN HFA) 108 (90 BASE) MCG/ACT inhaler   Inhalation   Inhale 2 puffs into the lungs every 6 (six) hours as needed for wheezing.   1 Inhaler   2   . ALPRAZolam (XANAX) 1 MG tablet   Oral   Take 1 tablet (1 mg total) by mouth 3 (three) times daily.   270 tablet   0   . aspirin EC 81 MG tablet   Oral   Take 81 mg by mouth every morning.          . baclofen (LIORESAL) 10 MG tablet   Oral   Take 0.5 tablets (5 mg total) by mouth at bedtime.   30 each   3   . calcium carbonate (OS-CAL) 600 MG TABS    Oral   Take 600 mg by mouth every other day.          . cetirizine (ZYRTEC) 10 MG tablet   Oral   Take 1 tablet (10 mg total) by mouth daily.   30 tablet   11   . Cholecalciferol (VITAMIN D3) 5000 UNITS TABS   Oral   Take by mouth. As directed , once daily         . furosemide (LASIX) 20 MG tablet   Oral   Take 1 tablet (20 mg total) by mouth daily.   90 tablet   1   . hydrochlorothiazide (HYDRODIURIL) 25 MG tablet   Oral   Take 25 mg by mouth daily.         . Ipratropium-Albuterol (COMBIVENT) 20-100 MCG/ACT AERS respimat   Inhalation   Inhale 1 puff into the lungs every 6 (six) hours.   4 g   0   . levothyroxine (SYNTHROID, LEVOTHROID) 175 MCG tablet   Oral   Take 175 mcg by mouth daily.         Marland Kitchen losartan-hydrochlorothiazide (HYZAAR) 50-12.5 MG per tablet   Oral   Take 1 tablet by mouth daily.   90 tablet   3   . meloxicam (MOBIC) 7.5 MG tablet   Oral   Take 1 tablet (7.5 mg total) by mouth daily.   30 tablet   3   . metFORMIN (GLUCOPHAGE XR) 500 MG 24 hr tablet   Oral   Take 1 tablet (500 mg total) by mouth daily with breakfast.   90 tablet   3   . metoCLOPramide (REGLAN) 10 MG tablet   Oral   Take 1 tablet (10 mg total) by mouth every 6 (six) hours.   30 tablet   0   . ondansetron (ZOFRAN) 4 MG tablet   Oral   Take 1 tablet (4 mg total) by mouth every 8 (eight) hours as needed for nausea.   20 tablet   1   . pantoprazole (PROTONIX) 40 MG tablet   Oral   Take 1 tablet (40 mg total) by mouth daily.   30 tablet   5   . Potassium Chloride ER 20 MEQ TBCR   Oral   Take 20 mEq by mouth daily.   90 tablet  1   . sertraline (ZOLOFT) 50 MG tablet   Oral   Take 1 tablet (50 mg total) by mouth daily.   90 tablet   1   . trimethoprim (TRIMPEX) 100 MG tablet   Oral   Take 1 tablet (100 mg total) by mouth 2 (two) times daily.   10 tablet   0     BP 142/66  Pulse 81  Temp(Src) 99.5 F (37.5 C) (Rectal)  Resp 18  SpO2  93%  Physical Exam  Nursing note and vitals reviewed. Constitutional: She is oriented to person, place, and time. She appears well-developed and well-nourished.  HENT:  Head: Normocephalic and atraumatic.  Right Ear: External ear normal.  Left Ear: External ear normal.  Nose: Nose normal.  Mouth/Throat: Oropharynx is clear and moist.  Eyes: Conjunctivae are normal. Pupils are equal, round, and reactive to light.  Pupils equal but poorly reactive.    Neck: Normal range of motion.  Cardiovascular: Normal rate, regular rhythm, normal heart sounds and intact distal pulses.   Pulmonary/Chest: Effort normal.  Abdominal: Soft. Bowel sounds are normal.  Musculoskeletal: Normal range of motion.  Neurological: She is alert and oriented to person, place, and time.  Skin: Skin is warm and dry.  Psychiatric: She has a normal mood and affect. Her behavior is normal. Judgment and thought content normal.    ED Course  Procedures (including critical care time)  Labs Reviewed - No data to display No results found.   No diagnosis found.  Results for orders placed during the hospital encounter of 03/04/13  CBC WITH DIFFERENTIAL      Result Value Range   WBC 12.4 (*) 4.0 - 10.5 K/uL   RBC 3.39 (*) 3.87 - 5.11 MIL/uL   Hemoglobin 11.4 (*) 12.0 - 15.0 g/dL   HCT 16.1 (*) 09.6 - 04.5 %   MCV 101.8 (*) 78.0 - 100.0 fL   MCH 33.6  26.0 - 34.0 pg   MCHC 33.0  30.0 - 36.0 g/dL   RDW 40.9  81.1 - 91.4 %   Platelets 294  150 - 400 K/uL   Neutrophils Relative 72  43 - 77 %   Neutro Abs 8.9 (*) 1.7 - 7.7 K/uL   Lymphocytes Relative 19  12 - 46 %   Lymphs Abs 2.3  0.7 - 4.0 K/uL   Monocytes Relative 6  3 - 12 %   Monocytes Absolute 0.8  0.1 - 1.0 K/uL   Eosinophils Relative 2  0 - 5 %   Eosinophils Absolute 0.3  0.0 - 0.7 K/uL   Basophils Relative 1  0 - 1 %   Basophils Absolute 0.1  0.0 - 0.1 K/uL  COMPREHENSIVE METABOLIC PANEL      Result Value Range   Sodium 137  135 - 145 mEq/L    Potassium 3.9  3.5 - 5.1 mEq/L   Chloride 101  96 - 112 mEq/L   CO2 26  19 - 32 mEq/L   Glucose, Bld 133 (*) 70 - 99 mg/dL   BUN 18  6 - 23 mg/dL   Creatinine, Ser 7.82  0.50 - 1.10 mg/dL   Calcium 95.6  8.4 - 21.3 mg/dL   Total Protein 6.6  6.0 - 8.3 g/dL   Albumin 4.1  3.5 - 5.2 g/dL   AST 20  0 - 37 U/L   ALT 20  0 - 35 U/L   Alkaline Phosphatase 49  39 - 117 U/L  Total Bilirubin 0.5  0.3 - 1.2 mg/dL   GFR calc non Af Amer 45 (*) >90 mL/min   GFR calc Af Amer 52 (*) >90 mL/min  URINALYSIS, ROUTINE W REFLEX MICROSCOPIC      Result Value Range   Color, Urine YELLOW  YELLOW   APPearance CLEAR  CLEAR   Specific Gravity, Urine 1.025  1.005 - 1.030   pH 6.0  5.0 - 8.0   Glucose, UA NEGATIVE  NEGATIVE mg/dL   Hgb urine dipstick NEGATIVE  NEGATIVE   Bilirubin Urine NEGATIVE  NEGATIVE   Ketones, ur NEGATIVE  NEGATIVE mg/dL   Protein, ur NEGATIVE  NEGATIVE mg/dL   Urobilinogen, UA 1.0  0.0 - 1.0 mg/dL   Nitrite NEGATIVE  NEGATIVE   Leukocytes, UA NEGATIVE  NEGATIVE  TROPONIN I      Result Value Range   Troponin I <0.30  <0.30 ng/mL     MDM  Patient with normal labs here.  Continues to complain of not feeling well and multiple chronic complaints.  Patient encouraged to follow up with Dr. Felicity Coyer as no specific findings here and labs appear normal with mild leukocytosis.          Hilario Quarry, MD 03/04/13 [phone]

## 2013-03-04 NOTE — ED Notes (Signed)
Pt complains of extreme weakness and facial "pulling"

## 2013-03-04 NOTE — Patient Instructions (Signed)
Please continue all other medications as before, and refills have been done if requested. You can also take Benadryl 50 mg OTC as needed We will let Dr Felicity Coyer know of your request for the alprazolam

## 2013-03-04 NOTE — Assessment & Plan Note (Signed)
stable overall by history and exam, recent data reviewed with pt, and pt to continue medical treatment as before,  to f/u any worsening symptoms or concerns BP Readings from Last 3 Encounters:  03/04/13 140/90  03/02/13 130/72  02/27/13 122/66

## 2013-03-04 NOTE — Assessment & Plan Note (Addendum)
?   Allergy related symptoms, offered depomedrol IM but refused, for benadryl OTC prn,  to f/u any worsening symptoms or concerns  Note: pt upset that I have found no other specific etiology, states she would not return to see me, and Dr Felicity Coyer "does not give up on me"; I suggested mentioning her concerns to our office manager  Pt states she is going to ER and I am a poor doctor for not calling to let them know she is coming

## 2013-03-04 NOTE — ED Notes (Signed)
Pt states "I feel like I am going to die"; pt c/o Shortness of breath, chest pain, nausea, headache; pt states "I don't feel well at all; Pt with numerous complaints; pt reassured that VSS and that MD is aware.

## 2013-03-04 NOTE — Assessment & Plan Note (Signed)
stable overall by history and exam, recent data reviewed with pt, and pt to continue medical treatment as before,  to f/u any worsening symptoms or concerns SpO2 Readings from Last 3 Encounters:  03/04/13 93%  03/02/13 91%  02/27/13 90%

## 2013-03-04 NOTE — ED Notes (Signed)
Pt. Attempted to use bedpan to give urine sample but missed.

## 2013-03-04 NOTE — Assessment & Plan Note (Signed)
Large component today, apparently out of her xanax, declines a short local course, will let Dr Leschber/PCP know of husband request for 90 day tx op her mailin pharmacy, but mentioned this is often not done due to the controlled substance nature of this medication

## 2013-03-04 NOTE — Progress Notes (Signed)
Subjective:    Patient ID: Paula [name], female    DOB: 10/25/1927, 79 y.o.   MRN: [id number]  HPI  Here with acute visit, pt of Dr Felicity Coyer with dementia having been tx recently for UTI, poor historian, mentions at least 20 times "I am sick" and o/w numerous nonspecific complaints including head pressure, left face "drawing", sort of dizziness and funny feeling in the head, anterior neck swelling, trouble swallowing (but cant seem to narrow to oropharyngeal or esophageal like symptoms); all symptoms intermittent "for some time" and cant be specific;  Nothing seems to make better or worse.  Overall good compliance with treatment, and good medicine tolerability, but does recall she missed a baclofen dose last PM, and husband states she is out of xanax and needs a 90 day rx tonight to her [employer]. Denies urinary symptoms such as dysuria, frequency, urgency, flank pain, hematuria or n/v, fever, chills.  Pt denies chest pain, increased sob or doe, orthopnea, PND, increased LE swelling, palpitations,  or syncope and is very specific her wheezing not currently worse than baseline.  Pt frustrated by her difficulty in giving hx, husband not able to help with this today.  Recent CT head neg for acute apr 11. No recent increased cough, fever, overt aspiration Past Medical History  Diagnosis Date  . Depression   . Ejection fraction     EF 40%, echo, November, 2012  / in improved, EF 60%, echo, December, 2012  . Contrast media allergy     Patient feels poorly with contrast  . Status post AAA (abdominal aortic aneurysm) repair     Surgical repair, Dr. Hart Rochester, December 27, 2011  . Pre-syncope     vasovagal w/ heart block  . Parotid mass     has refused further eval 10/2011  . GERD (gastroesophageal reflux disease)   . Anxiety   . Facial tic     L sided spasms - Botox trial summer 2013, ?effective  . Macular degeneration   . AAA (abdominal aortic aneurysm)     s/p repair 12/2011  . Congestive heart  failure     NL EF 10/2011 echo   . Diabetes mellitus type II, controlled   . Hypothyroidism   . Sinus bradycardia 09/19/2011    Occurring simultaneously with complete heart block   . Hypertension   . COPD (chronic obstructive pulmonary disease)   . Abdominal spasms     hemifacial-treated w/ botox xeomin -Dr Terrace Arabia  . Confusion 11/02/12    hospitalized for 10 days,amnestic fo rthe stay and did not recognize her home when husband brought her home.  . Sinus disorder tested 11/03/11    syndrome  . Syncope and collapse    Past Surgical History  Procedure Laterality Date  . Cholecystectomy    . Knee cartilage surgery      left  . Sinus surgery with instatrak    . Appendectomy    . Oophorectomy      ovarian cyst  . Cataract extraction      bilateral  . Cardiac catheterization  11/04/11  . Bladder repair    . US echocardiography  10/14/11  . Abdominal aortic aneurysm repair  12/27/2011    Procedure: ANEURYSM ABDOMINAL AORTIC REPAIR;  Surgeon: Josephina Gip, MD;  Location: [facility] OR;  Service: Vascular;  Laterality: N/A;  Resection and Grafting Abdominal Aortic Aneurysm , Aorta Bi Iliac.  Marland Kitchen Pacemaker insertion  11/12    reports that she quit smoking about 18 months  ago. Her smoking use included Cigarettes. She has a 30 pack-year smoking history. She has never used smokeless tobacco. She reports that she does not drink alcohol or use illicit drugs. family history includes Breast cancer in her sister; Cancer in her father and sister; Colon cancer in her sister; Esophageal cancer in her father; and Heart disease in her mother and sister. Allergies  Allergen Reactions  . Bactrim (Sulfamethoxazole-Tmp Ds)   . Contrast Media (Iodinated Diagnostic Agents) Anaphylaxis  . Gadolinium Derivatives   . Iohexol Anaphylaxis, Shortness Of Breath and Swelling     Desc: PT STATES SHE HAD A SEVERE REACTION TO IV CONRAST WITH THROAT SWELLING AND SOB. SHE WAS ADMITTED TO THE HOSPITAL. SHE HAS NEVER HAD CONTRAST  AGAIN.   Marland Kitchen Penicillins Rash  . Sulfa Antibiotics Anaphylaxis  . Avelox (Moxifloxacin Hcl In Nacl) Rash  . Ciprofloxacin Itching   Current Outpatient Prescriptions on File Prior to Visit  Medication Sig Dispense Refill  . albuterol (PROVENTIL HFA;VENTOLIN HFA) 108 (90 BASE) MCG/ACT inhaler Inhale 2 puffs into the lungs every 6 (six) hours as needed for wheezing.  1 Inhaler  2  . ALPRAZolam (XANAX) 1 MG tablet Take 1 tablet (1 mg total) by mouth 3 (three) times daily.  270 tablet  0  . aspirin EC 81 MG tablet Take 81 mg by mouth every morning.       . baclofen (LIORESAL) 10 MG tablet Take 0.5 tablets (5 mg total) by mouth at bedtime.  30 each  3  . calcium carbonate (OS-CAL) 600 MG TABS Take 600 mg by mouth daily.      . cetirizine (ZYRTEC) 10 MG tablet Take 1 tablet (10 mg total) by mouth daily.  30 tablet  11  . Cholecalciferol (VITAMIN D3) 5000 UNITS TABS Take by mouth. As directed , once daily      . furosemide (LASIX) 20 MG tablet Take 1 tablet (20 mg total) by mouth daily.  90 tablet  1  . guaiFENesin (MUCINEX) 600 MG 12 hr tablet Take 600 mg by mouth daily.       . hydrochlorothiazide (HYDRODIURIL) 25 MG tablet Take 25 mg by mouth daily.      . Ipratropium-Albuterol (COMBIVENT) 20-100 MCG/ACT AERS respimat Inhale 1 puff into the lungs every 6 (six) hours.  4 g  0  . levothyroxine (SYNTHROID, LEVOTHROID) 175 MCG tablet Take 175 mcg by mouth daily.      Marland Kitchen losartan-hydrochlorothiazide (HYZAAR) 50-12.5 MG per tablet Take 1 tablet by mouth daily.  90 tablet  3  . meloxicam (MOBIC) 7.5 MG tablet Take 1 tablet (7.5 mg total) by mouth daily.  30 tablet  3  . metFORMIN (GLUCOPHAGE XR) 500 MG 24 hr tablet Take 1 tablet (500 mg total) by mouth daily with breakfast.  90 tablet  3  . metoCLOPramide (REGLAN) 10 MG tablet Take 1 tablet (10 mg total) by mouth every 6 (six) hours.  30 tablet  0  . ondansetron (ZOFRAN) 4 MG tablet Take 1 tablet (4 mg total) by mouth every 8 (eight) hours as needed for  nausea.  20 tablet  1  . pantoprazole (PROTONIX) 40 MG tablet Take 1 tablet (40 mg total) by mouth daily.  30 tablet  5  . Potassium Chloride ER 20 MEQ TBCR Take 20 mEq by mouth daily.  90 tablet  1  . sertraline (ZOLOFT) 50 MG tablet Take 1 tablet (50 mg total) by mouth daily.  90 tablet  1  .  trimethoprim (TRIMPEX) 100 MG tablet Take 1 tablet (100 mg total) by mouth 2 (two) times daily.  10 tablet  0   No current facility-administered medications on file prior to visit.   Review of Systems  Constitutional: Negative for unexpected weight change, or unusual diaphoresis  HENT: Negative for tinnitus.   Eyes: Negative for photophobia and visual disturbance.  Respiratory: Negative for choking and stridor.   Gastrointestinal: Negative for vomiting and blood in stool.  Genitourinary: Negative for hematuria and decreased urine volume.  Musculoskeletal: Negative for acute joint swelling Skin: Negative for color change and wound.  Neurological: Negative for tremors and numbness other than noted  Psychiatric/Behavioral: Negative for decreased concentration or  hyperactivity.       Objective:   Physical Exam BP 140/90  Pulse 80  Temp(Src) 98.5 F (36.9 C) (Oral)  Wt 174 lb (78.926 kg)  BMI 28.96 kg/m2  SpO2 93% VS noted, not ill appearing, 3+ nervous, irritable, demanding, but not hostile or aggressive Constitutional: Pt appears well-developed and well-nourished.  HENT: Head: NCAT.  Bilat tm's with mild erythema.  Max sinus areas non tender.  Pharynx with mild erythema, no exudate Right Ear: External ear normal.  Left Ear: External ear normal.  Eyes: Conjunctivae and EOM are normal. Pupils are equal, round, and reactive to light.  Neck: Normal range of motion. Neck supple. No mass or swelling noted Cardiovascular: Normal rate and regular rhythm.   Pulmonary/Chest: Effort normal and breath sounds decreased, no rales or wheezing.  Abd:  Soft, NT, non-distended, + BS, no flank  tender Neurological: Pt is alert.  Walks with walker, moves all 4's equally Skin: Skin is warm. No erythema.      Assessment & Plan:

## 2013-03-05 ENCOUNTER — Telehealth: Payer: Self-pay | Admitting: Internal Medicine

## 2013-03-05 ENCOUNTER — Encounter: Payer: Self-pay | Admitting: Internal Medicine

## 2013-03-05 ENCOUNTER — Ambulatory Visit (INDEPENDENT_AMBULATORY_CARE_PROVIDER_SITE_OTHER): Payer: Medicare Other | Admitting: Internal Medicine

## 2013-03-05 VITALS — BP 132/74 | HR 84 | Temp 98.5°F | Wt 172.0 lb

## 2013-03-05 DIAGNOSIS — G518 Other disorders of facial nerve: Secondary | ICD-10-CM

## 2013-03-05 DIAGNOSIS — J029 Acute pharyngitis, unspecified: Secondary | ICD-10-CM

## 2013-03-05 DIAGNOSIS — G5139 Clonic hemifacial spasm, unspecified: Secondary | ICD-10-CM

## 2013-03-05 MED ORDER — MAGIC MOUTHWASH W/LIDOCAINE: 5.0000 mL | Freq: Three times a day (TID) | ORAL | Status: DC | PRN

## 2013-03-05 NOTE — Telephone Encounter (Signed)
Called pt spoke with husband inform him new rx was fax on Tues to [facility]. Will contact & see what is the problem with rx. Called [facility] spoke with Lauren/ pharmacist she stated they did received script, actually has 2 scripts for alprazolam. Inform her md increase alprazolam to 1 mg on Tues, so d/c the 0.5mg . They will mail pt med out today per lauren...Raechel Chute

## 2013-03-05 NOTE — Telephone Encounter (Signed)
Husband needs to speak with Ann Lions says they did not receive a refill on patients alprazolam, he also thought that we were increasing patient from 3x to 4x daily so she would need more pills called in.

## 2013-03-05 NOTE — Patient Instructions (Signed)
Hemifacial Spasm Hemifacial spasm is a neuromuscular disorder. Individuals with this disorder often have frequent involuntary contractions of the muscles on one side of the face. The disorder occurs in both men and women. It occurs more frequently in middle-aged or elderly women. SYMPTOMS  The first symptom is usually an intermittent twitching of the eyelid muscle. This symptom can lead to forced closure of the eye. The spasms may gradually spread to the muscles of the lower face. This can cause the mouth to be pulled to one side. Eventually the spasms involve all of the muscles on one side of the face and occur almost continuously. The condition is sometimes caused by a facial nerve injury or tumor, although frequently no cause is identified. TREATMENT  Treatment of hemifacial spasm frequently consists of injecting botox (botulinum toxin) into the affected muscles. Surgery may be used for some cases. The likely outcome for treated cases varies from patient to patient. Document Released: 10/18/2002 Document Revised: 01/20/2012 Document Reviewed: 10/28/2005 [facility] Patient Information 2013 [place], [place].

## 2013-03-05 NOTE — Progress Notes (Signed)
Subjective:    Patient ID: Paula [name], female    DOB: 07/13/1927, 79 y.o.   MRN: [id number]  HPI  Pt presents to the clinic today with c/o facial spasms. This is a chronic issue for her. She is on Baclofen for this. She did miss a dose yesterday. She did see Dr. Jonny Ruiz for the same yesterday but she was not able to explain why she was at the office. She ended up leaving her and going to the ER. They could not give her a diagnosis. She is scheduled to follow up with neurology on Monday. They give her Botox injections for the same.  Review of Systems      Past Medical History  Diagnosis Date  . Depression   . Ejection fraction     EF 40%, echo, November, 2012  / in improved, EF 60%, echo, December, 2012  . Contrast media allergy     Patient feels poorly with contrast  . Status post AAA (abdominal aortic aneurysm) repair     Surgical repair, Dr. Hart Rochester, December 27, 2011  . Pre-syncope     vasovagal w/ heart block  . Parotid mass     has refused further eval 10/2011  . GERD (gastroesophageal reflux disease)   . Anxiety   . Facial tic     L sided spasms - Botox trial summer 2013, ?effective  . Macular degeneration   . AAA (abdominal aortic aneurysm)     s/p repair 12/2011  . Congestive heart failure     NL EF 10/2011 echo   . Diabetes mellitus type II, controlled   . Hypothyroidism   . Sinus bradycardia 09/19/2011    Occurring simultaneously with complete heart block   . Hypertension   . COPD (chronic obstructive pulmonary disease)   . Abdominal spasms     hemifacial-treated w/ botox xeomin -Dr Terrace Arabia  . Confusion 11/02/12    hospitalized for 10 days,amnestic fo rthe stay and did not recognize her home when husband brought her home.  . Sinus disorder tested 11/03/11    syndrome  . Syncope and collapse     Current Outpatient Prescriptions  Medication Sig Dispense Refill  . albuterol (PROVENTIL HFA;VENTOLIN HFA) 108 (90 BASE) MCG/ACT inhaler Inhale 2 puffs into the lungs  every 6 (six) hours as needed for wheezing.  1 Inhaler  2  . ALPRAZolam (XANAX) 1 MG tablet Take 1 tablet (1 mg total) by mouth 3 (three) times daily.  270 tablet  0  . aspirin EC 81 MG tablet Take 81 mg by mouth every morning.       . baclofen (LIORESAL) 10 MG tablet Take 0.5 tablets (5 mg total) by mouth at bedtime.  30 each  3  . calcium carbonate (OS-CAL) 600 MG TABS Take 600 mg by mouth every other day.       . cetirizine (ZYRTEC) 10 MG tablet Take 1 tablet (10 mg total) by mouth daily.  30 tablet  11  . Cholecalciferol (VITAMIN D3) 5000 UNITS TABS Take 1 tablet by mouth every other day. As directed , once daily      . furosemide (LASIX) 20 MG tablet Take 1 tablet (20 mg total) by mouth daily.  90 tablet  1  . GUAIFENESIN PO Take 1 tablet by mouth daily.      . hydrochlorothiazide (HYDRODIURIL) 25 MG tablet Take 25 mg by mouth daily.      . Ipratropium-Albuterol (COMBIVENT) 20-100 MCG/ACT AERS respimat Inhale 1 puff  into the lungs every 6 (six) hours as needed for wheezing or shortness of breath.      . levothyroxine (SYNTHROID, LEVOTHROID) 175 MCG tablet Take 175 mcg by mouth daily.      Marland Kitchen losartan-hydrochlorothiazide (HYZAAR) 50-12.5 MG per tablet Take 1 tablet by mouth daily.  90 tablet  3  . meloxicam (MOBIC) 7.5 MG tablet Take 1 tablet (7.5 mg total) by mouth daily.  30 tablet  3  . metFORMIN (GLUCOPHAGE-XR) 500 MG 24 hr tablet Take 500 mg by mouth daily with breakfast.      . metoCLOPramide (REGLAN) 10 MG tablet Take 1 tablet (10 mg total) by mouth every 6 (six) hours.  30 tablet  0  . ondansetron (ZOFRAN) 4 MG tablet Take 1 tablet (4 mg total) by mouth every 8 (eight) hours as needed for nausea.  20 tablet  1  . pantoprazole (PROTONIX) 40 MG tablet Take 1 tablet (40 mg total) by mouth daily.  30 tablet  5  . Potassium Chloride ER 20 MEQ TBCR Take 20 mEq by mouth daily.  90 tablet  1  . sertraline (ZOLOFT) 50 MG tablet Take 1 tablet (50 mg total) by mouth daily.  90 tablet  1  .  trimethoprim (TRIMPEX) 100 MG tablet Take 100 mg by mouth 2 (two) times daily. For 5 days.      . Alum & Mag Hydroxide-Simeth (MAGIC MOUTHWASH W/LIDOCAINE) SOLN Take 5 mLs by mouth 3 (three) times daily as needed.  120 mL  0   Current Facility-Administered Medications  Medication Dose Route Frequency Provider Last Rate Last Dose  . methylPREDNISolone acetate (DEPO-MEDROL) injection 80 mg  80 mg Intramuscular Once Corwin Levins, MD        Allergies  Allergen Reactions  . Bactrim (Sulfamethoxazole-Tmp Ds)   . Contrast Media (Iodinated Diagnostic Agents) Anaphylaxis  . Gadolinium Derivatives   . Iohexol Anaphylaxis, Shortness Of Breath and Swelling     Desc: PT STATES SHE HAD A SEVERE REACTION TO IV CONRAST WITH THROAT SWELLING AND SOB. SHE WAS ADMITTED TO THE HOSPITAL. SHE HAS NEVER HAD CONTRAST AGAIN.   Marland Kitchen Penicillins Rash  . Sulfa Antibiotics Anaphylaxis  . Avelox (Moxifloxacin Hcl In Nacl) Rash  . Ciprofloxacin Itching    Family History  Problem Relation Age of Onset  . Esophageal cancer Father   . Cancer Father   . Breast cancer Sister   . Cancer Sister   . Colon cancer Sister   . Heart disease Sister   . Heart disease Mother     History   Social History  . Marital Status: Married    Spouse Name: N/A    Number of Children: 4  . Years of Education: N/A   Occupational History  . retired Other    worked for 25 years, hairdresser   Social History Main Topics  . Smoking status: Former Smoker -- 1.00 packs/day for 30 years    Types: Cigarettes    Quit date: 08/31/2011  . Smokeless tobacco: Never Used  . Alcohol Use: No  . Drug Use: No  . Sexually Active: Not Currently    Birth Control/ Protection: Post-menopausal   Other Topics Concern  . Not on file   Social History Narrative  . No narrative on file     Constitutional: Denies fever, malaise, fatigue, headache or abrupt weight changes. . Musculoskeletal: Pt reports muscle spasms of pain. Denies decrease in  range of motion, difficulty with gait, or joint pain and  swelling.   Neurological: Denies dizziness, difficulty with speech or problems with balance and coordination.   No other specific complaints in a complete review of systems (except as listed in HPI above).  Objective:   Physical Exam   BP 132/74  Pulse 84  Temp(Src) 98.5 F (36.9 C) (Oral)  Wt 172 lb (78.019 kg)  BMI 28.62 kg/m2  SpO2 95% Wt Readings from Last 3 Encounters:  03/05/13 172 lb (78.019 kg)  03/04/13 174 lb (78.926 kg)  03/02/13 174 lb 12.8 oz (79.289 kg)    General: Appears her stated age, well developed, well nourished in NAD.Marland Kitchen  Cardiovascular: Normal rate and rhythm. S1,S2 noted.  No murmur, rubs or gallops noted. No JVD or BLE edema. No carotid bruits noted. Pulmonary/Chest: Normal effort and positive vesicular breath sounds. No respiratory distress. No wheezes, rales or ronchi noted.  Musculoskeletal: Normal range of motion. No signs of joint swelling. No muscle spasms of the face noted. Neurological: Alert and oriented.        Assessment & Plan:   Muscle spasm of face, new onset:  Take 1/2 tablet of baclofen when you get home then continue your current regimen Follow up with Neurology on Monday I would advise you to continue  Your botox injections  RTC as needed

## 2013-03-06 ENCOUNTER — Emergency Department (INDEPENDENT_AMBULATORY_CARE_PROVIDER_SITE_OTHER)
Admission: EM | Admit: 2013-03-06 | Discharge: 2013-03-06 | Disposition: A | Discharge status: Home / Self Care | Payer: Medicare Other | Source: Home / Self Care

## 2013-03-06 ENCOUNTER — Encounter (HOSPITAL_COMMUNITY): Payer: Self-pay | Admitting: *Deleted

## 2013-03-06 DIAGNOSIS — Z0000 Encounter for general adult medical examination without abnormal findings: Secondary | ICD-10-CM

## 2013-03-06 DIAGNOSIS — R110 Nausea: Secondary | ICD-10-CM

## 2013-03-06 DIAGNOSIS — R200 Anesthesia of skin: Secondary | ICD-10-CM

## 2013-03-06 DIAGNOSIS — R209 Unspecified disturbances of skin sensation: Secondary | ICD-10-CM

## 2013-03-06 LAB — POCT URINALYSIS DIP (DEVICE)
Hgb urine dipstick: NEGATIVE
Ketones, ur: NEGATIVE mg/dL
Protein, ur: NEGATIVE mg/dL
Specific Gravity, Urine: 1.025 (ref 1.005–1.030)
Urobilinogen, UA: 0.2 mg/dL (ref 0.0–1.0)
pH: 5.5 (ref 5.0–8.0)

## 2013-03-06 MED ORDER — ONDANSETRON HCL 4 MG/2ML IJ SOLN
4.0000 mg | Freq: Once | INTRAMUSCULAR | Status: AC
  Administered 2013-03-06: 4 mg via INTRAMUSCULAR

## 2013-03-06 MED ORDER — ONDANSETRON HCL 4 MG/2ML IJ SOLN
INTRAMUSCULAR | Status: AC
  Filled 2013-03-06: qty 2

## 2013-03-06 NOTE — ED Provider Notes (Signed)
History     CSN: [id number]  Arrival date & time 03/06/13  1103   None     Chief Complaint  Patient presents with  . Jaw Pain  . Nausea    (Consider location/radiation/quality/duration/timing/severity/associated sxs/prior treatment) HPI Comments: 79 year old female is accompanied by her husband and presents to the urgent care for chronic intermittent facial discomfort. She states she is unable to describe it and states look at my face and you will no what is wrong. She apparently sees a primary care physician plus several specialists for chronic illnesses. She receives Botox injections to her face on a regular basis approximately every 3 months for that condition for which she is trying to explain. She keeps referring to her problem has not yet and this. Her past medical history as documented she has a facial tic. She does not have that today. Later she stated it felt like a pulling in her face and has numbness. This is chronic for several years. There are no new complaints today. She is all so complaining of acute on chronic nausea. She took a Zofran this morning but is not feeling as well as she would like to. She is complaining of feeling bad in general. She was seen emergency department 2 days ago with similar complaints she had a battery of test which were all negative for acute illness.   Past Medical History  Diagnosis Date  . Depression   . Ejection fraction     EF 40%, echo, November, 2012  / in improved, EF 60%, echo, December, 2012  . Contrast media allergy     Patient feels poorly with contrast  . Status post AAA (abdominal aortic aneurysm) repair     Surgical repair, Dr. Hart Rochester, December 27, 2011  . Pre-syncope     vasovagal w/ heart block  . Parotid mass     has refused further eval 10/2011  . GERD (gastroesophageal reflux disease)   . Anxiety   . Facial tic     L sided spasms - Botox trial summer 2013, ?effective  . Macular degeneration   . AAA (abdominal aortic  aneurysm)     s/p repair 12/2011  . Congestive heart failure     NL EF 10/2011 echo   . Diabetes mellitus type II, controlled   . Hypothyroidism   . Sinus bradycardia 09/19/2011    Occurring simultaneously with complete heart block   . Hypertension   . COPD (chronic obstructive pulmonary disease)   . Abdominal spasms     hemifacial-treated w/ botox xeomin -Dr Terrace Arabia  . Confusion 11/02/12    hospitalized for 10 days,amnestic fo rthe stay and did not recognize her home when husband brought her home.  . Sinus disorder tested 11/03/11    syndrome  . Syncope and collapse   . UTI (lower urinary tract infection)     Past Surgical History  Procedure Laterality Date  . Cholecystectomy    . Knee cartilage surgery      left  . Sinus surgery with instatrak    . Appendectomy    . Oophorectomy      ovarian cyst  . Cataract extraction      bilateral  . Cardiac catheterization  11/04/11  . Bladder repair    . US echocardiography  10/14/11  . Abdominal aortic aneurysm repair  12/27/2011    Procedure: ANEURYSM ABDOMINAL AORTIC REPAIR;  Surgeon: Josephina Gip, MD;  Location: [facility] OR;  Service: Vascular;  Laterality: N/A;  Resection and  Grafting Abdominal Aortic Aneurysm , Aorta Bi Iliac.  Marland Kitchen Pacemaker insertion  11/12    Family History  Problem Relation Age of Onset  . Esophageal cancer Father   . Cancer Father   . Breast cancer Sister   . Cancer Sister   . Colon cancer Sister   . Heart disease Sister   . Heart disease Mother     History  Substance Use Topics  . Smoking status: Former Smoker -- 1.00 packs/day for 30 years    Types: Cigarettes    Quit date: 08/31/2011  . Smokeless tobacco: Never Used  . Alcohol Use: No    OB History   Grav Para Term Preterm Abortions TAB SAB Ect Mult Living                  Review of Systems  Constitutional: Positive for activity change. Negative for fever and chills.  HENT: Negative for hearing loss, ear pain, congestion, sore throat, trouble  swallowing, neck pain, neck stiffness, dental problem and sinus pressure.        See history of present illness  Eyes: Negative for pain, discharge and itching.  Respiratory: Negative for choking, shortness of breath and wheezing.   Cardiovascular: Negative for chest pain, palpitations and leg swelling.  Gastrointestinal: Positive for nausea. Negative for vomiting and blood in stool.  Genitourinary: Negative.        Recent diagnosis of UTI currently being treated with antibiotics.  Neurological: Positive for numbness. Negative for dizziness and speech difficulty.  Hematological: Negative.     Allergies  Bactrim; Contrast media; Gadolinium derivatives; Iohexol; Penicillins; Sulfa antibiotics; Avelox; and Ciprofloxacin  Home Medications   Current Outpatient Rx  Name  Route  Sig  Dispense  Refill  . albuterol (PROVENTIL HFA;VENTOLIN HFA) 108 (90 BASE) MCG/ACT inhaler   Inhalation   Inhale 2 puffs into the lungs every 6 (six) hours as needed for wheezing.   1 Inhaler   2   . ALPRAZolam (XANAX) 1 MG tablet   Oral   Take 1 tablet (1 mg total) by mouth 3 (three) times daily.   270 tablet   0   . aspirin EC 81 MG tablet   Oral   Take 81 mg by mouth every morning.          . baclofen (LIORESAL) 10 MG tablet   Oral   Take 0.5 tablets (5 mg total) by mouth at bedtime.   30 each   3   . calcium carbonate (OS-CAL) 600 MG TABS   Oral   Take 600 mg by mouth every other day.          . cetirizine (ZYRTEC) 10 MG tablet   Oral   Take 1 tablet (10 mg total) by mouth daily.   30 tablet   11   . Cholecalciferol (VITAMIN D3) 5000 UNITS TABS   Oral   Take 1 tablet by mouth every other day. As directed , once daily         . furosemide (LASIX) 20 MG tablet   Oral   Take 1 tablet (20 mg total) by mouth daily.   90 tablet   1   . GUAIFENESIN PO   Oral   Take 1 tablet by mouth daily.         . hydrochlorothiazide (HYDRODIURIL) 25 MG tablet   Oral   Take 25 mg by mouth  daily.         . Ipratropium-Albuterol (COMBIVENT) 20-100  MCG/ACT AERS respimat   Inhalation   Inhale 1 puff into the lungs every 6 (six) hours as needed for wheezing or shortness of breath.         . levothyroxine (SYNTHROID, LEVOTHROID) 175 MCG tablet   Oral   Take 175 mcg by mouth daily.         Marland Kitchen losartan-hydrochlorothiazide (HYZAAR) 50-12.5 MG per tablet   Oral   Take 1 tablet by mouth daily.   90 tablet   3   . meloxicam (MOBIC) 7.5 MG tablet   Oral   Take 1 tablet (7.5 mg total) by mouth daily.   30 tablet   3   . metFORMIN (GLUCOPHAGE-XR) 500 MG 24 hr tablet   Oral   Take 500 mg by mouth daily with breakfast.         . metoCLOPramide (REGLAN) 10 MG tablet   Oral   Take 1 tablet (10 mg total) by mouth every 6 (six) hours.   30 tablet   0   . ondansetron (ZOFRAN) 4 MG tablet   Oral   Take 1 tablet (4 mg total) by mouth every 8 (eight) hours as needed for nausea.   20 tablet   1   . pantoprazole (PROTONIX) 40 MG tablet   Oral   Take 1 tablet (40 mg total) by mouth daily.   30 tablet   5   . Potassium Chloride ER 20 MEQ TBCR   Oral   Take 20 mEq by mouth daily.   90 tablet   1   . sertraline (ZOLOFT) 50 MG tablet   Oral   Take 1 tablet (50 mg total) by mouth daily.   90 tablet   1   . trimethoprim (TRIMPEX) 100 MG tablet   Oral   Take 100 mg by mouth 2 (two) times daily. For 5 days.         . Alum & Mag Hydroxide-Simeth (MAGIC MOUTHWASH W/LIDOCAINE) SOLN   Oral   Take 5 mLs by mouth 3 (three) times daily as needed.   120 mL   0     BP 144/65  Pulse 86  Temp(Src) 98.8 F (37.1 C) (Oral)  Resp 18  SpO2 96%  Physical Exam  Nursing note and vitals reviewed. Constitutional: She is oriented to person, place, and time. She appears well-developed and well-nourished. No distress.  HENT:  Mouth/Throat: Oropharynx is clear and moist.  Eyes: Conjunctivae and EOM are normal.  Neck: Normal range of motion. Neck supple.  Cardiovascular:  Normal rate, regular rhythm and normal heart sounds.   Pulmonary/Chest: Effort normal and breath sounds normal. No respiratory distress.  Musculoskeletal: Normal range of motion. She exhibits no edema and no tenderness.  Neurological: She is alert and oriented to person, place, and time. She exhibits normal muscle tone. Coordination normal.  Facial examination reveals no asymmetry. Did not appreciate swelling, discoloration or weakness. No skin changes. Muscle strength is intact. Muscles of mastication are intact. Speech is clear. No abnormal findings of the face or surrounding structures.  Skin: Skin is warm and dry.  Psychiatric: She has a normal mood and affect.    ED Course  Procedures (including critical care time)  Labs Reviewed  POCT URINALYSIS DIP (DEVICE) - Abnormal; Notable for the following:    Bilirubin Urine SMALL (*)    All other components within normal limits   Ct Abdomen Pelvis Wo Contrast  03/04/2013  *RADIOLOGY REPORT*  Clinical Data:  Abdominal pain.  Extreme weakness and nausea.  CT ABDOMEN AND PELVIS WITHOUT CONTRAST (CT UROGRAM)  Technique: Contiguous axial images of the abdomen and pelvis without oral or intravenous contrast were obtained.  Comparison: 11/06/2012 and 12/17/2011.  Findings:  Exam is limited for evaluation of entities other than urinary tract calculi due to lack of oral or intravenous contrast.   Lung bases:  Centrilobular emphysema.  Incompletely imaged probable rounded atelectasis at the left lung base, similar.  Mild cardiomegaly with left-sided pleural thickening and minimal fluid, similar.  Abdomen/pelvis:  Old granulomatous disease in the liver and spleen. Mild hepatic steatosis.  Mild lateral segment left liver lobe prominence with possible subtle irregular hepatic contour.  Small hiatal hernia.  Underdistended proximal stomach.  Low density mass extending off the caudal aspect of the pancreatic body again identified.  This measures 5.5 x 4.8 cm versus  6.5 x 4.7 cm on the prior exam, slightly decreased.  Cholecystectomy without biliary ductal dilatation.  Normal adrenal glands.  Mild renal atrophy bilaterally. No renal calculi or hydronephrosis.  No hydroureter or ureteric calculi.  Abdominal aortic aneurysm repair with residual ectasia at the level of the renal arteries.  3.7 cm.  No surrounding hemorrhage. No retroperitoneal or retrocrural adenopathy.  Normal colon and terminal ileum.  Normal small bowel without abdominal ascites.  Minimal omental nodularity on image 42/series 2 is similar to on the prior exam.  Not readily apparent on the 2012 study.  Bilateral fat containing inguinal hernias.  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 No pelvic adenopathy.  Normal urinary bladder.  Normal uterus and adnexa, without significant free pelvic fluid.  Prior ventral pelvic wall hernia repair  Bones/Musculoskeletal:  Moderate osteopenia.  IMPRESSION:  1.  No urinary tract calculi or hydronephrosis. 2.  Mild hepatic steatosis.  Hepatic morphology which is suspicious for mild cirrhosis.  Correlate with risk factors. 3.  Similar left lung base presume rounded atelectasis and adjacent pleural thickening/fluid. 4.  Slight decrease in size of a lesion extending off the caudal aspect of the pancreatic body.  Favored to represent a pseudocyst. 5.  Status post aortic aneurysm repair.  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 6.  Subtle omental/peritoneal nodularity.  Similar to on the prior exam, but new since 2012.  If there is no prior malignancy history, this may relate to adjacent remote diverticulosis or diverticulitis.  If there is any such history of primary malignancy, follow-up CT at 3 months should be considered to exclude metastasis.   Original Report Authenticated By: Jeronimo Greaves, M.D.    Dg Chest Port 1 View  03/04/2013   *RADIOLOGY REPORT*  Clinical Data: Shortness of breath.  Chest pain.  Hypertension.  Ex- smoker.  PORTABLE CHEST - 1 VIEW  Comparison: 02/12/2013  Findings: Moderate cardiomegaly.  Atherosclerosis in the transverse aorta.  Small left pleural effusion is similar.  Left apical pleural thickening.  The chin overlies the apices. No pneumothorax. Interstitial thickening is similar.  No overt congestive failure. Patchy scarring at the left lung base.  IMPRESSION: Cardiomegaly and hyperinflation.  Mild chronic interstitial thickening is favored be due to COPD/chronic bronchitis.  No overt congestive failure.  A small left pleural effusion is similar to the prior.   Original Report Authenticated By: Jeronimo Greaves, M.D.      1. Normal physical exam   2. Numbness of face   3. Nausea  MDM  No findings of an acute nature. She is not having chest pain or shortness of breath pain the complaints are new or acute. Since she has received no relief from the Zofran this morning we will give her an injection of 4 mg IM prior to discharge. She has an appointment with her PCP in 2-3 days advised to keep that appointment. Per any new symptoms problems or worsening may return.  Hayden Rasmussen, NP 03/06/13 1200

## 2013-03-06 NOTE — ED Provider Notes (Signed)
Medical screening examination/treatment/procedure(s) were performed by resident physician or non-physician practitioner and as supervising physician I was immediately available for consultation/collaboration.   Barkley Bruns MD.   Linna Hoff, MD 03/06/13 [phone]

## 2013-03-06 NOTE — ED Notes (Signed)
Pt & spouse state pt has appt with neurologist on Monday.  Repeatedly explaining to pt that there is not a medication or any intervention at [facility] that will eliminate the numb sensation she has in jaw.

## 2013-03-06 NOTE — ED Notes (Signed)
C/O recurrent numbness along entire jaw since this morning - states she gets this frequently and believes it is R/T botox injections for facial twitching.  Also c/o nausea - woke up during night with nausea, no vomiting - states nausea has been recurrent since AAA surgery in 2/14.  Denies any pain anywhere.  Denies fevers, denies chest pain or abd pain.  States "I just don't feel good".  Currently being treated for UTI.  Was seen in PCP office for same sxs 4/24, then chose to go to ED where she had extensive work-up.  Also saw PCP again yesterday.

## 2013-03-08 ENCOUNTER — Encounter: Payer: Self-pay | Admitting: Neurology

## 2013-03-08 ENCOUNTER — Ambulatory Visit (INDEPENDENT_AMBULATORY_CARE_PROVIDER_SITE_OTHER): Payer: Medicare Other | Admitting: Neurology

## 2013-03-08 VITALS — BP 113/66 | HR 83 | Temp 98.9°F | Ht 65.0 in | Wt 171.0 lb

## 2013-03-08 DIAGNOSIS — F0390 Unspecified dementia without behavioral disturbance: Secondary | ICD-10-CM

## 2013-03-08 DIAGNOSIS — G518 Other disorders of facial nerve: Secondary | ICD-10-CM

## 2013-03-08 DIAGNOSIS — G514 Facial myokymia: Secondary | ICD-10-CM

## 2013-03-08 MED ORDER — BACLOFEN 10 MG PO TABS: 5.0000 mg | ORAL_TABLET | Freq: Every day | ORAL | Status: DC

## 2013-03-08 MED ORDER — ALPRAZOLAM 1 MG PO TABS: 1.0000 mg | ORAL_TABLET | Freq: Three times a day (TID) | ORAL | Status: DC

## 2013-03-08 NOTE — Patient Instructions (Addendum)
Paroxysmal Choreoathetosis Disease Paroxysmal choreoathetosis is a movement disorder. With it, there are episodes or attacks of involuntary movements of the limbs, trunk, and facial muscles. The disorder may occur in several members of a family. Or it may happen in only a single family member. Prior to an attack, some people have tightening of muscles or other physical symptoms. Involuntary movements cause some attacks. Other attacks happen when the individual has consumed alcohol or caffeine, or is tired or stressed. Attacks can last from 10 seconds to over an hour. Some individuals have lingering muscle tightness after an attack. This disorder often starts in early adolescence. A gene linked to the disorder has been discovered. The same gene is also linked to epilepsy. TREATMENT  Drug therapy has been successful in reducing or eliminating attacks. Carbamazepine is an effective drug but it is not effective in every case. Other drugs have been substituted with good effect. In general, this disorder lessens with age. Many adults have a complete remission. Drug therapy is effective so the prognosis for the disorder is good. Document Released: 10/18/2002 Document Revised: 01/20/2012 Document Reviewed: 10/24/2008 [facility] ([facility]) Patient Information 2013 [place], [place].  facial twitching is a movement disorder that can be  focal seizure related , or  a focal- facial response to Reglan .

## 2013-03-08 NOTE — Progress Notes (Signed)
[facility]  Provider:  Dr Kierstyn Baranowski Referring Provider: Newt Lukes, MD Primary Care Physician:  Rene Paci, MD  Chief Complaint  Patient presents with  . New Evaluation    nx, rm #11 facial twitching    HPI:  Paula [name] is a 79 y.o. female here as a referral from Dr. Felicity Coyer and the recently seen Hospitalist of whom she cannot recall the name. Has been followed over 2 years in our practice and initially presented with a chief complaint of left hemifacial spasms. She had received the omentum at [facility] and I am the last injection in September 2013 I. correct the last injection on 12/31/2012 but rarely has a visible left facial tremor after this anymore however chief he'll the tremor is. Recovering from pneumonia and was recently admitted at [facility]. Her history of the facial spasms reaches back over 7 years.  A MRI in December 2012 showed mild brain atrophy but extensive white matter disease nor large vessel disease was found,  Her memory  scores have been declining:  she has also macular degeneration bilaterally especially acute worsening of the right vision . She status post aortic aneurysm repair in February  2013 .   She is again complaining about nausea and facial twitches.  Reviewed her hospital notes, as the patient is not able to give details. She is under the impression that EMG or EEG are to be repeated here. ?     Today score on the MOCA was 14 points out of 30 - she has no MMSE score ?  The last one in Feb was 28 points. AFT 15 points.  The patient is on Synthroid, baclofen, aspirin ,HCTZ , vitamin D ,calcium, Xanax, Lodine capsules and guaifenesin. She completed a course of azithromycin without success. She receives XEOMIN  injections through Dr. Terrace Arabia   Review of Systems: Out of a complete 14 system review, the patient complains of only the following symptoms, and all other reviewed systems are negative. Chronic nausea patient waking up at 4  in the morning as reflux. Recently overcame the morning her. She'll twitching left dominant. History of coronary artery disease but no shortness of breath and also loss of consciousness since December 2012. Vision had heart failure and was admitted for this twice. PCP is now LESCHBER.  History   Social History  . Marital Status: Married    Spouse Name: N/A    Number of Children: 4  . Years of Education: N/A   Occupational History  . retired Other    worked for 25 years, hairdresser   Social History Main Topics  . Smoking status: Former Smoker -- 1.00 packs/day for 30 years    Types: Cigarettes    Quit date: 08/31/2011  . Smokeless tobacco: Never Used  . Alcohol Use: No  . Drug Use: No  . Sexually Active: Not on file   Other Topics Concern  . Not on file   Social History Narrative  . No narrative on file    Family History  Problem Relation Age of Onset  . Esophageal cancer Father   . Cancer Father   . Breast cancer Sister   . Cancer Sister   . Colon cancer Sister   . Heart disease Sister   . Heart disease Mother   . Heart disease Son     Past Medical History  Diagnosis Date  . Depression   . Ejection fraction     EF 40%, echo, November, 2012  /  in improved, EF 60%, echo, December, 2012  . Contrast media allergy     Patient feels poorly with contrast  . Status post AAA (abdominal aortic aneurysm) repair     Surgical repair, Dr. Hart Rochester, December 27, 2011  . Pre-syncope     vasovagal w/ heart block  . Parotid mass     has refused further eval 10/2011  . GERD (gastroesophageal reflux disease)   . Anxiety   . Facial tic     L sided spasms - Botox trial summer 2013, ?effective  . Macular degeneration   . AAA (abdominal aortic aneurysm)     s/p repair 12/2011  . Congestive heart failure     NL EF 10/2011 echo   . Diabetes mellitus type II, controlled   . Hypothyroidism   . Sinus bradycardia 09/19/2011    Occurring simultaneously with complete heart block   .  Hypertension   . COPD (chronic obstructive pulmonary disease)   . Abdominal spasms     hemifacial-treated w/ botox xeomin -Dr Terrace Arabia  . Confusion 11/02/12    hospitalized for 10 days,amnestic fo rthe stay and did not recognize her home when husband brought her home.  . Sinus disorder tested 11/03/11    syndrome  . Syncope and collapse   . UTI (lower urinary tract infection)     Past Surgical History  Procedure Laterality Date  . Cholecystectomy    . Knee cartilage surgery      left  . Sinus surgery with instatrak    . Appendectomy    . Oophorectomy      ovarian cyst  . Cataract extraction      bilateral  . Cardiac catheterization  11/04/11  . Bladder repair    . US echocardiography  10/14/11  . Abdominal aortic aneurysm repair  12/27/2011    Procedure: ANEURYSM ABDOMINAL AORTIC REPAIR;  Surgeon: Josephina Gip, MD;  Location: [facility] OR;  Service: Vascular;  Laterality: N/A;  Resection and Grafting Abdominal Aortic Aneurysm , Aorta Bi Iliac.  Marland Kitchen Pacemaker insertion  11/12    Current Outpatient Prescriptions  Medication Sig Dispense Refill  . albuterol (PROVENTIL HFA;VENTOLIN HFA) 108 (90 BASE) MCG/ACT inhaler Inhale 2 puffs into the lungs every 6 (six) hours as needed for wheezing.  1 Inhaler  2  . ALPRAZolam (XANAX) 1 MG tablet Take 1 tablet (1 mg total) by mouth 3 (three) times daily.  270 tablet  0  . Alum & Mag Hydroxide-Simeth (MAGIC MOUTHWASH W/LIDOCAINE) SOLN Take 5 mLs by mouth 3 (three) times daily as needed.  120 mL  0  . aspirin EC 81 MG tablet Take 81 mg by mouth every morning.       . baclofen (LIORESAL) 10 MG tablet Take 0.5 tablets (5 mg total) by mouth at bedtime.  30 each  3  . calcium carbonate (OS-CAL) 600 MG TABS Take 600 mg by mouth every other day.       . cetirizine (ZYRTEC) 10 MG tablet Take 1 tablet (10 mg total) by mouth daily.  30 tablet  11  . Cholecalciferol (VITAMIN D3) 5000 UNITS TABS Take 1 tablet by mouth every other day. As directed , once daily      .  furosemide (LASIX) 20 MG tablet Take 1 tablet (20 mg total) by mouth daily.  90 tablet  1  . GUAIFENESIN PO Take 1 tablet by mouth daily.      . hydrochlorothiazide (HYDRODIURIL) 25 MG tablet Take 25 mg by mouth  daily.      . Ipratropium-Albuterol (COMBIVENT) 20-100 MCG/ACT AERS respimat Inhale 1 puff into the lungs every 6 (six) hours as needed for wheezing or shortness of breath.      . levothyroxine (SYNTHROID, LEVOTHROID) 175 MCG tablet Take 175 mcg by mouth daily.      Marland Kitchen losartan-hydrochlorothiazide (HYZAAR) 50-12.5 MG per tablet Take 1 tablet by mouth daily.  90 tablet  3  . meloxicam (MOBIC) 7.5 MG tablet Take 1 tablet (7.5 mg total) by mouth daily.  30 tablet  3  . metoCLOPramide (REGLAN) 10 MG tablet Take 1 tablet (10 mg total) by mouth every 6 (six) hours.  30 tablet  0  . pantoprazole (PROTONIX) 40 MG tablet Take 1 tablet (40 mg total) by mouth daily.  30 tablet  5  . sertraline (ZOLOFT) 50 MG tablet Take 1 tablet (50 mg total) by mouth daily.  90 tablet  1  . metFORMIN (GLUCOPHAGE-XR) 500 MG 24 hr tablet Take 500 mg by mouth daily with breakfast.      . ondansetron (ZOFRAN) 4 MG tablet Take 1 tablet (4 mg total) by mouth every 8 (eight) hours as needed for nausea.  20 tablet  1  . Potassium Chloride ER 20 MEQ TBCR Take 20 mEq by mouth daily.  90 tablet  1  . potassium chloride SA (K-DUR,KLOR-CON) 20 MEQ tablet        Current Facility-Administered Medications  Medication Dose Route Frequency Provider Last Rate Last Dose  . methylPREDNISolone acetate (DEPO-MEDROL) injection 80 mg  80 mg Intramuscular Once Corwin Levins, MD        Allergies as of 03/08/2013 - Review Complete 03/06/2013  Allergen Reaction Noted  . Bactrim (sulfamethoxazole-tmp ds)  02/25/2013  . Contrast media (iodinated diagnostic agents) Anaphylaxis 09/15/2011  . Gadolinium derivatives  02/25/2013  . Iohexol Anaphylaxis, Shortness Of Breath, and Swelling 08/10/2007  . Penicillins Rash 09/15/2011  . Sulfa  antibiotics Anaphylaxis 09/15/2011  . Avelox (moxifloxacin hcl in nacl) Rash 08/31/2012  . Ciprofloxacin Itching 01/02/2013    Vitals: BP 113/66  Pulse 83  Temp(Src) 98.9 F (37.2 C)  Ht 5\' 5"  (1.651 m)  Wt 171 lb (77.565 kg)  BMI 28.46 kg/m2 Last Weight:  Wt Readings from Last 1 Encounters:  03/08/13 171 lb (77.565 kg)   Last Height:   Ht Readings from Last 1 Encounters:  03/08/13 5\' 5"  (1.651 m)   Physical exam:  General: The patient is awake, alert and appears not in acute distress. The patient is well groomed. Head: Normocephalic, atraumatic. Neck is supple. Mallampati, neck circumference:15 inches   Cardiovascular:  Regular rate and rhythm, without  murmurs or carotid bruit, and without distended neck veins. Respiratory: Lungs are clear to auscultation. Skin:  Without evidence of edema, or rash Trunk: BMI is elevated and patient  has normal posture.   No falls .   Neurologic exam : The patient is awake and alert, oriented to place and time.  Memory subjective  described as intact. There is a normal attention span & concentration ability. Speech is fluent without  dysarthria,  But she has dysphonia, not  Aphasia.  Mood and affect are appropriate.  Cranial nerves: Pupils are equal and briskly reactive to light. Funduscopic exam:  Show scars on the lLeft  Eye's retinae. There is  evidence of pallor or edema.  PATIENT HAS MACULAR DEGENERATION.  Extraocular movements  in vertical and horizontal planes intact and without nystagmus. Visual fields by finger perimetry are  intact. Hearing to finger rub intact.  Facial sensation intact to fine touch. Facial motor strength is symmetric and tongue and uvula move midline.  Motor exam:   Normal tone and normal muscle bulk and symmetric normal strength in all extremities.  Sensory:  Fine touch, pinprick and vibration were tested in all extremities.  Coordination: Rapid alternating movements in the fingers/hands is tested and normal.  Finger-to-nose maneuver tested and normal without evidence of ataxia, dysmetria or tremor.  Gait and station: Patient walks with a walker as  assistive device and is able and  Is assisted stool climb up to the exam table. Strength within normal limits. Stance is stable and normal.  Tandem gait is impaired. , turns with Steps are unfragmented.  Deep tendon reflexes: in the  upper and lower extremities are symmetric and intact. Babinski maneuver response is  downgoing.   Assessment:  After physical and neurologic examination, review of laboratory studies, imaging, neurophysiology testing and pre-existing records, assessment will be reviewed on the problem list.  Plan:  Treatment plan and additional workup will be reviewed under Problem List.

## 2013-03-10 ENCOUNTER — Encounter: Payer: Self-pay | Admitting: Internal Medicine

## 2013-03-10 ENCOUNTER — Ambulatory Visit (INDEPENDENT_AMBULATORY_CARE_PROVIDER_SITE_OTHER): Payer: Medicare Other | Admitting: Internal Medicine

## 2013-03-10 VITALS — BP 134/76 | HR 107 | Temp 97.7°F | Ht 65.0 in | Wt 174.0 lb

## 2013-03-10 DIAGNOSIS — R05 Cough: Secondary | ICD-10-CM

## 2013-03-10 DIAGNOSIS — R0789 Other chest pain: Secondary | ICD-10-CM

## 2013-03-10 DIAGNOSIS — R110 Nausea: Secondary | ICD-10-CM

## 2013-03-10 DIAGNOSIS — R071 Chest pain on breathing: Secondary | ICD-10-CM

## 2013-03-10 NOTE — Progress Notes (Signed)
Subjective:    Patient ID: Paula [name], female    DOB: [birth year], 79 y.o.   MRN: [id number]  HPI  Pt presents to the clinic today with complaints of nausea that started at 2 am this morning. She has a long standing history of nausea and is on zofran 4 mg as needed. She did take it at 2:30 this morning. The nausea does come and go. The zofran does seem to help. She also s/o pain in her right side. This also started this morning at 2 am after a coughing fit, but has seemed to resolve. She has not had any cough or pain in her side since this morning.  Review of Systems      Past Medical History  Diagnosis Date  . Depression   . Ejection fraction     EF 40%, echo, November, 2012  / in improved, EF 60%, echo, December, 2012  . Contrast media allergy     Patient feels poorly with contrast  . Status post AAA (abdominal aortic aneurysm) repair     Surgical repair, Dr. Hart Rochester, December 27, 2011  . Pre-syncope     vasovagal w/ heart block  . Parotid mass     has refused further eval 10/2011  . GERD (gastroesophageal reflux disease)   . Anxiety   . Facial tic     L sided spasms - Botox trial summer 2013, ?effective  . Macular degeneration   . AAA (abdominal aortic aneurysm)     s/p repair 12/2011  . Congestive heart failure     NL EF 10/2011 echo   . Diabetes mellitus type II, controlled   . Hypothyroidism   . Sinus bradycardia 09/19/2011    Occurring simultaneously with complete heart block   . Hypertension   . COPD (chronic obstructive pulmonary disease)   . Abdominal spasms     hemifacial-treated w/ botox xeomin -Dr Terrace Arabia  . Confusion 11/02/12    hospitalized for 10 days,amnestic fo rthe stay and did not recognize her home when husband brought her home.  . Sinus disorder tested 11/03/11    syndrome  . Syncope and collapse   . UTI (lower urinary tract infection)     Current Outpatient Prescriptions  Medication Sig Dispense Refill  . albuterol (PROVENTIL HFA;VENTOLIN HFA) 108  (90 BASE) MCG/ACT inhaler Inhale 2 puffs into the lungs every 6 (six) hours as needed for wheezing.  1 Inhaler  2  . ALPRAZolam (XANAX) 1 MG tablet Take 1 tablet (1 mg total) by mouth 3 (three) times daily.  270 tablet  0  . Alum & Mag Hydroxide-Simeth (MAGIC MOUTHWASH W/LIDOCAINE) SOLN Take 5 mLs by mouth 3 (three) times daily as needed.  120 mL  0  . aspirin EC 81 MG tablet Take 81 mg by mouth every morning.       . baclofen (LIORESAL) 10 MG tablet Take 0.5 tablets (5 mg total) by mouth at bedtime.  30 each  3  . calcium carbonate (OS-CAL) 600 MG TABS Take 600 mg by mouth every other day.       . cetirizine (ZYRTEC) 10 MG tablet Take 1 tablet (10 mg total) by mouth daily.  30 tablet  11  . Cholecalciferol (VITAMIN D3) 5000 UNITS TABS Take 1 tablet by mouth every other day. As directed , once daily      . furosemide (LASIX) 20 MG tablet Take 1 tablet (20 mg total) by mouth daily.  90 tablet  1  .  GUAIFENESIN PO Take 1 tablet by mouth daily.      . hydrochlorothiazide (HYDRODIURIL) 25 MG tablet Take 25 mg by mouth daily.      . Ipratropium-Albuterol (COMBIVENT) 20-100 MCG/ACT AERS respimat Inhale 1 puff into the lungs every 6 (six) hours as needed for wheezing or shortness of breath.      . levothyroxine (SYNTHROID, LEVOTHROID) 175 MCG tablet Take 175 mcg by mouth daily.      Marland Kitchen losartan-hydrochlorothiazide (HYZAAR) 50-12.5 MG per tablet Take 1 tablet by mouth daily.  90 tablet  3  . meloxicam (MOBIC) 7.5 MG tablet Take 1 tablet (7.5 mg total) by mouth daily.  30 tablet  3  . metFORMIN (GLUCOPHAGE-XR) 500 MG 24 hr tablet Take 500 mg by mouth daily with breakfast.      . ondansetron (ZOFRAN) 4 MG tablet Take 1 tablet (4 mg total) by mouth every 8 (eight) hours as needed for nausea.  20 tablet  1  . pantoprazole (PROTONIX) 40 MG tablet Take 1 tablet (40 mg total) by mouth daily.  30 tablet  5  . Potassium Chloride ER 20 MEQ TBCR Take 20 mEq by mouth daily.  90 tablet  1  . potassium chloride SA  (K-DUR,KLOR-CON) 20 MEQ tablet       . sertraline (ZOLOFT) 50 MG tablet Take 1 tablet (50 mg total) by mouth daily.  90 tablet  1   Current Facility-Administered Medications  Medication Dose Route Frequency Provider Last Rate Last Dose  . methylPREDNISolone acetate (DEPO-MEDROL) injection 80 mg  80 mg Intramuscular Once Corwin Levins, MD        Allergies  Allergen Reactions  . Bactrim (Sulfamethoxazole-Tmp Ds)   . Contrast Media (Iodinated Diagnostic Agents) Anaphylaxis  . Gadolinium Derivatives   . Iohexol Anaphylaxis, Shortness Of Breath and Swelling     Desc: PT STATES SHE HAD A SEVERE REACTION TO IV CONRAST WITH THROAT SWELLING AND SOB. SHE WAS ADMITTED TO THE HOSPITAL. SHE HAS NEVER HAD CONTRAST AGAIN.   Marland Kitchen Penicillins Rash  . Sulfa Antibiotics Anaphylaxis  . Avelox (Moxifloxacin Hcl In Nacl) Rash  . Ciprofloxacin Itching    Family History  Problem Relation Age of Onset  . Esophageal cancer Father   . Cancer Father   . Breast cancer Sister   . Cancer Sister   . Colon cancer Sister   . Heart disease Sister   . Heart disease Mother   . Heart disease Son     History   Social History  . Marital Status: Married    Spouse Name: N/A    Number of Children: 4  . Years of Education: N/A   Occupational History  . retired Other    worked for 25 years, hairdresser   Social History Main Topics  . Smoking status: Former Smoker -- 1.00 packs/day for 30 years    Types: Cigarettes    Quit date: 08/31/2011  . Smokeless tobacco: Never Used  . Alcohol Use: No  . Drug Use: No  . Sexually Active: Not on file   Other Topics Concern  . Not on file   Social History Narrative  . No narrative on file     Constitutional: Denies fever, malaise, fatigue, headache or abrupt weight changes.  Respiratory: Denies difficulty breathing, shortness of breath, cough or sputum production.   Cardiovascular: Denies chest pain, chest tightness, palpitations or swelling in the hands or feet.   Gastrointestinal: Pt reports nausea. Denies abdominal pain, bloating, constipation, diarrhea or  blood in the stool.   No other specific complaints in a complete review of systems (except as listed in HPI above).  Objective:   Physical Exam  BP 134/76  Pulse 107  Temp(Src) 97.7 F (36.5 C) (Oral)  Ht 5\' 5"  (1.651 m)  Wt 174 lb (78.926 kg)  BMI 28.96 kg/m2  SpO2 95% Wt Readings from Last 3 Encounters:  03/10/13 174 lb (78.926 kg)  03/08/13 171 lb (77.565 kg)  03/05/13 172 lb (78.019 kg)    General: Appears  her stated age, well developed, well nourished in NAD. Cardiovascular: Normal rate and rhythm. S1,S2 noted.  No murmur, rubs or gallops noted. No JVD or BLE edema. No carotid bruits noted. Pulmonary/Chest: Normal effort and positive vesicular breath sounds. No respiratory distress. No wheezes, rales or ronchi noted.  Abdomen: Soft and nontender. Normal bowel sounds, no bruits noted. No distention or masses noted. Liver, spleen and kidneys non palpable.    Assessment & Plan:  Nausea without vomiting:  Pt declined IM shot of Zofran 4 mg Continue to Zofran 4 mg prn for nausea  Cough with resulting right side pain, resolved:  Monitor symptoms If they continue or are worse, RTC for further evaluation

## 2013-03-10 NOTE — Patient Instructions (Signed)
Nausea, Adult Nausea is the feeling that you have an upset stomach or have to vomit. Nausea by itself is not likely a serious concern, but it may be an early sign of more serious medical problems. As nausea gets worse, it can lead to vomiting. If vomiting develops, there is the risk of dehydration.  CAUSES   Viral infections.  Food poisoning.  Medicines.  Pregnancy.  Motion sickness.  Migraine headaches.  Emotional distress.  Severe pain from any source.  Alcohol intoxication. HOME CARE INSTRUCTIONS  Get plenty of rest.  Ask your caregiver about specific rehydration instructions.  Eat small amounts of food and sip liquids more often.  Take all medicines as told by your caregiver. SEEK MEDICAL CARE IF:  You have not improved after 2 days, or you get worse.  You have a headache. SEEK IMMEDIATE MEDICAL CARE IF:   You have a fever.  You faint.  You keep vomiting or have blood in your vomit.  You are extremely weak or dehydrated.  You have dark or bloody stools.  You have severe chest or abdominal pain. MAKE SURE YOU:  Understand these instructions.  Will watch your condition.  Will get help right away if you are not doing well or get worse. Document Released: 12/05/2004 Document Revised: 01/20/2012 Document Reviewed: 07/10/2011 Exit[employer] Patient Information 2013 [employer].  

## 2013-03-12 ENCOUNTER — Other Ambulatory Visit: Payer: Self-pay | Admitting: *Deleted

## 2013-03-12 MED ORDER — LEVOTHYROXINE SODIUM 175 MCG PO TABS: 175.0000 ug | ORAL_TABLET | Freq: Every day | ORAL | Status: DC

## 2013-03-15 ENCOUNTER — Other Ambulatory Visit: Payer: Self-pay | Admitting: *Deleted

## 2013-03-15 DIAGNOSIS — G514 Facial myokymia: Secondary | ICD-10-CM

## 2013-03-15 MED ORDER — METFORMIN HCL ER 500 MG PO TB24: 500.0000 mg | ORAL_TABLET | Freq: Every day | ORAL | Status: DC

## 2013-03-15 MED ORDER — POTASSIUM CHLORIDE CRYS ER 20 MEQ PO TBCR: 20.0000 meq | EXTENDED_RELEASE_TABLET | Freq: Every day | ORAL | Status: DC

## 2013-03-15 MED ORDER — BACLOFEN 10 MG PO TABS: 5.0000 mg | ORAL_TABLET | Freq: Every day | ORAL | Status: DC

## 2013-03-16 ENCOUNTER — Telehealth: Payer: Self-pay | Admitting: Internal Medicine

## 2013-03-16 NOTE — Telephone Encounter (Signed)
Patient Information:  Caller Name: [name]  Phone: [phone]  Patient: Paula [name]  Gender: Female  DOB: 1927/04/12  Age: 79 Years  PCP: Rene Paci (Adults only)  Office Follow Up:  Does the office need to follow up with this patient?: No  Instructions For The Office: N/A  RN Note:  Strict call back parameters given. Caller verbalized understanding.  Symptoms  Reason For Call & Symptoms: Reports patient uses a walker normally, but is having more weakness than normal. Denies injury. Reports feeling pain in her arms when using the walker. Requesting an appointment.  Reviewed Health History In EMR: Yes  Reviewed Medications In EMR: Yes  Reviewed Allergies In EMR: Yes  Reviewed Surgeries / Procedures: Yes  Date of Onset of Symptoms: 03/13/2013  Guideline(s) Used:  No Protocol Available - Sick Adult  Disposition Per Guideline:   See Today in Office  Reason For Disposition Reached:   Patient wants to be seen  Advice Given:  N/A  Patient Will Follow Care Advice:  YES  Appointment Scheduled:  03/17/2013 16:15:00 Appointment Scheduled Provider:  Rene Paci (Adults only)

## 2013-03-17 ENCOUNTER — Encounter: Payer: Self-pay | Admitting: Internal Medicine

## 2013-03-17 ENCOUNTER — Ambulatory Visit (INDEPENDENT_AMBULATORY_CARE_PROVIDER_SITE_OTHER): Payer: Medicare Other | Admitting: Internal Medicine

## 2013-03-17 DIAGNOSIS — F411 Generalized anxiety disorder: Secondary | ICD-10-CM

## 2013-03-17 DIAGNOSIS — R300 Dysuria: Secondary | ICD-10-CM

## 2013-03-17 DIAGNOSIS — R5381 Other malaise: Secondary | ICD-10-CM

## 2013-03-17 DIAGNOSIS — R1313 Dysphagia, pharyngeal phase: Secondary | ICD-10-CM

## 2013-03-17 DIAGNOSIS — R627 Adult failure to thrive: Secondary | ICD-10-CM

## 2013-03-17 DIAGNOSIS — F419 Anxiety disorder, unspecified: Secondary | ICD-10-CM

## 2013-03-17 DIAGNOSIS — R531 Weakness: Secondary | ICD-10-CM

## 2013-03-17 MED ORDER — PANTOPRAZOLE SODIUM 40 MG PO TBEC: 40.0000 mg | DELAYED_RELEASE_TABLET | Freq: Every day | ORAL | Status: DC

## 2013-03-17 NOTE — Patient Instructions (Signed)
It was good to see you today. We have reviewed your prior records including labs and tests today Medications reviewed and updated, no changes recommended at this time. we'll make referral to outpatient physical therapy . Our office will contact you regarding appointment(s) once made. Test(s) ordered today. Will check urine test on Friday. Your results will be released to MyChart (or called to you) after review, usually within 72hours after test completion. If any changes need to be made, you will be notified at that same time. Please followup as needed

## 2013-03-17 NOTE — Assessment & Plan Note (Signed)
Significant component of anxiety evident on exam (spouse and pt), likely contributing to chronic symptoms Review and reassurance provided to patient and spouse today Continue same sertraline dose and increased to TID BZ 02/2013 S/p counselin with gutterman 02/2013 x 1 OV, to follow up prn  Support provided at length today including review of her hospitalization and surgery fall 2013

## 2013-03-17 NOTE — Progress Notes (Signed)
Subjective:    Patient ID: Paula [name], female    DOB: October 29, 1927, 79 y.o.   MRN: [id number]  Extremity Weakness     complains of feeling weak "all over" - ?OP PT refer as HHPT not helpful  multiple visits here, ER and pulm for same in recent weeks/months  Interval history reviewed including numerous recent office visits and overnight hospitalization 12/2012 for same   Past Medical History  Diagnosis Date  . Depression   . Ejection fraction     EF 40%, echo, November, 2012  / in improved, EF 60%, echo, December, 2012  . Contrast media allergy     Patient feels poorly with contrast  . Status post AAA (abdominal aortic aneurysm) repair     Surgical repair, Dr. Hart Rochester, December 27, 2011  . Pre-syncope     vasovagal w/ heart block  . Parotid mass     has refused further eval 10/2011  . GERD (gastroesophageal reflux disease)   . Anxiety   . Facial tic     L sided spasms - Botox trial summer 2013, ?effective  . Macular degeneration   . AAA (abdominal aortic aneurysm)     s/p repair 12/2011  . Congestive heart failure     NL EF 10/2011 echo   . Diabetes mellitus type II, controlled   . Hypothyroidism   . Sinus bradycardia 09/19/2011    Occurring simultaneously with complete heart block   . Hypertension   . COPD (chronic obstructive pulmonary disease)   . Abdominal spasms     hemifacial-treated w/ botox xeomin -Dr Terrace Arabia  . Confusion 11/02/12    hospitalized for 10 days,amnestic fo rthe stay and did not recognize her home when husband brought her home.  . Sinus disorder tested 11/03/11    syndrome  . Syncope and collapse     Review of Systems  HENT: Positive for hoarse voice.   Respiratory: Positive for cough (chronic) and shortness of breath.   Cardiovascular: Negative for chest pain and leg swelling.  Genitourinary: Positive for dysuria and urgency. Negative for difficulty urinating.  Musculoskeletal: Positive for extremity weakness.  Neurological: Positive for  weakness. Negative for headaches.  Psychiatric/Behavioral: Negative for sleep disturbance and dysphoric mood.       Objective:   Physical Exam  There were no vitals taken for this visit. Wt Readings from Last 3 Encounters:  03/10/13 174 lb (78.926 kg)  03/08/13 171 lb (77.565 kg)  03/05/13 172 lb (78.019 kg)   Gen: NAD, nontoxic. hoarse without change. Spouse at side HENT: sinuses nontender. Tympanic membranes clear bilaterally without effusion. Oropharynx with erythema and mild exudate on the right side. Nares without discharge or turbinate swelling Lungs: CTA B - no wheeze or crackles; diminished air movement at bases CV: RRR, no edema BLE Abd: SNTND+BS, no R/G Neurologic: awake alert and oriented x4. Moves all extremities well. No gross deficits. Walks with aid of rolling walker. Psychiatric: tangential    Lab Results  Component Value Date   WBC 12.4* 03/04/2013   HGB 11.4* 03/04/2013   HCT 34.5* 03/04/2013   PLT 294 03/04/2013   GLUCOSE 133* 03/04/2013   CHOL 149 02/10/2013   TRIG 218.0* 02/10/2013   HDL 30.10* 02/10/2013   LDLDIRECT 90.6 02/10/2013   LDLCALC 80 11/04/2011   ALT 20 03/04/2013   AST 20 03/04/2013   NA 137 03/04/2013   K 3.9 03/04/2013   CL 101 03/04/2013   CREATININE 1.09 03/04/2013   BUN 18 03/04/2013  CO2 26 03/04/2013   TSH 0.80 12/28/2012   INR 1.06 08/24/2012   HGBA1C 7.2* 02/10/2013   11/02/12 ct chest w/o CM IMPRESSION:   1.  Loculated left pleural effusion with suspected rounded atelectasis in the left lower lobe.  No specific findings for intrathoracic malignancy identified.  If neoplasm is suspected, thoracentesis should be considered.  Otherwise, follow-up chest CT in 13-month suggested to assess stability. 2.  Evidence of prior granulomatous disease with calcified left lower lobe granuloma, calcified left hilar lymph nodes and calcified granulomas in the liver and spleen. 3.  Diffuse atherosclerosis. 4.  Cannot exclude a mesenteric mass inferior to  the pancreatic tail, incompletely visualized by this examination of the chest. Further evaluation with abdominal CT to include at least oral contrast recommended.  11/10/12 CT a/p w/CM IMPRESSION:   1.  The recently demonstrated mass in the mid abdomen is well circumscribed with low density and is inferior to the pancreatic body and tail.  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 2.  Interval successful repair of the abdominal aortic aneurysm. 3.  Stable postoperative appearance of the anterior abdominal wall with small left inguinal hernia containing only fat. 4.  Stable loculated left pleural effusion and suspected left lower lobe rounded atelectasis compared with recent chest CT.  CXR 01/02/13: IMPRESSION: 1.  The appearance of the chest, as above, is suggestive of mild congestive heart failure. 2.  Bibasilar subsegmental atelectasis and small bilateral pleural effusions. 3.  Atherosclerosis.       Assessment & Plan:   Generalized fatigue and "weakness" -nonfocal neuro exam. No specific localizing signs on exam or history. Medication allergy list updated. Recent labs reviewed. Refer for outpatient physical therapy to continue rehabilitation strengthening  See problem list. Medications and labs reviewed today.  Dysuria - check UA - hold empiric tx unless evidence for UTI

## 2013-03-19 ENCOUNTER — Ambulatory Visit (INDEPENDENT_AMBULATORY_CARE_PROVIDER_SITE_OTHER): Payer: Medicare Other | Admitting: Pulmonary Disease

## 2013-03-19 ENCOUNTER — Encounter: Payer: Self-pay | Admitting: Pulmonary Disease

## 2013-03-19 ENCOUNTER — Telehealth: Payer: Self-pay | Admitting: Pulmonary Disease

## 2013-03-19 ENCOUNTER — Ambulatory Visit (INDEPENDENT_AMBULATORY_CARE_PROVIDER_SITE_OTHER): Payer: Medicare Other

## 2013-03-19 ENCOUNTER — Telehealth: Payer: Self-pay | Admitting: Internal Medicine

## 2013-03-19 VITALS — BP 122/76 | HR 107 | Temp 98.0°F | Ht 64.0 in | Wt 178.0 lb

## 2013-03-19 DIAGNOSIS — J438 Other emphysema: Secondary | ICD-10-CM

## 2013-03-19 DIAGNOSIS — R300 Dysuria: Secondary | ICD-10-CM

## 2013-03-19 DIAGNOSIS — J439 Emphysema, unspecified: Secondary | ICD-10-CM

## 2013-03-19 DIAGNOSIS — G518 Other disorders of facial nerve: Secondary | ICD-10-CM

## 2013-03-19 DIAGNOSIS — J189 Pneumonia, unspecified organism: Secondary | ICD-10-CM

## 2013-03-19 DIAGNOSIS — N390 Urinary tract infection, site not specified: Secondary | ICD-10-CM

## 2013-03-19 DIAGNOSIS — G514 Facial myokymia: Secondary | ICD-10-CM

## 2013-03-19 LAB — PULMONARY FUNCTION TEST

## 2013-03-19 LAB — URINALYSIS, ROUTINE W REFLEX MICROSCOPIC
Hgb urine dipstick: NEGATIVE
Nitrite: NEGATIVE
Total Protein, Urine: NEGATIVE
Urine Glucose: NEGATIVE
pH: 6 (ref 5.0–8.0)

## 2013-03-19 MED ORDER — CEPHALEXIN 500 MG PO CAPS: 500.0000 mg | ORAL_CAPSULE | Freq: Three times a day (TID) | ORAL | Status: DC

## 2013-03-19 NOTE — Assessment & Plan Note (Signed)
-  has Fu with neurology Seems like this is the main issue now

## 2013-03-19 NOTE — Addendum Note (Signed)
Addended by: Rene Paci A on: 03/19/2013 12:51 PM   Modules accepted: Orders

## 2013-03-19 NOTE — Patient Instructions (Signed)
You have mild COPD from smoking - lung function is at 70% I will discuss with drs leschber & Dr Vickey Huger

## 2013-03-19 NOTE — Telephone Encounter (Signed)
Pt is requesting to have the physical therapy appts at [facility] on [address] in [place].

## 2013-03-19 NOTE — Telephone Encounter (Signed)
I have noted same -and agree We are working on same - thanks

## 2013-03-19 NOTE — Telephone Encounter (Signed)
(  continued)... Psychogenic in nature.  Believes pt would benefit a referral to psych as all neurological resources have been exhausted.  Dr. Vickey Huger left her personal number if RA has any questions or if RA would like to discuss in further detail.  Paula [name]

## 2013-03-19 NOTE — Progress Notes (Signed)
  Subjective:    Patient ID: Paula [name], female    DOB: [birth year], 79 y.o.   MRN: [id number]  HPI 79 year old ex smoker ( quit 10/12) for FU of COPD She smoked for >50 yrs , quit 2012 .  She had Recurrent PNA - 2 episodes during 2013  -She now has no residual cough or shortness of breath, no productive sputum . She underwent urgent surgery for abdominal aortic aneurysm by Dr. Hart Rochester in 2/13. She never recovered fully from this, she still ambulates with a walker.  CT scan of the chest 12/13 showed Loculated left pleural effusion with suspected rounded atelectasis in the left lower lobe. Evidence of prior granulomatous disease with calcified left lower lobe granuloma, calcified left hilar lymph nodes and calcified granulomas in the liver and spleen.  CT abdomen clarified a cystic mass in the abdomen, likely a pancreatic pseudocyst.  Review of her imaging study shows left lower lobe airspace disease dating back to 10/13.  Cxr 01/02/13 showed bilateral small pleural effusions, mild interstitial pulm edema.  Of note, she is allergic to contrast media and had a reaction with neck swelling requiring steroids.  ENT evaluation by Dr. Pollyann Kennedy for hoarseness has been nondiagnostic    03/19/2013 5 ER visits in last 6 mnths for what seems to be hemifacial spasms - has seen neurology (Dohmeier) - started on baclofen, alprazolam dose was increased, she continues to have these symptoms - had tingling over body - for few hrs before finally subsiding few ds ago.   PFT shows mild airway obstruction- fev1 70%, ratio 73 -no BD response, TLC 70% c/w midl restriction Pt states her breathing has improved some per spouse. Wheezing is not as bad. Has a little dry cough. Denies any chets tx.  She  is on Combivent Four times a day . She also uses ventolin most days. No hemoptysis , edema, chest pain , palpitations or orthopnea. No fever or discolored mucus   CT abd/pelvis 02/2012 showed subtle omental/peritoneal nodularity -  LLL rounded atelectasis was stable   Review of Systems neg for any significant sore throat, dysphagia, itching, sneezing, nasal congestion or excess/ purulent secretions, fever, chills, sweats, unintended wt loss, pleuritic or exertional cp, hempoptysis, orthopnea pnd or change in chronic leg swelling. Also denies presyncope, palpitations, heartburn, abdominal pain, nausea, vomiting, diarrhea or change in bowel or urinary habits, dysuria,hematuria, rash, arthralgias, visual complaints, headache, numbness weakness or ataxia.     Objective:   Physical Exam  Gen. Pleasant, well-nourished, in no distress, anxious affect, walker ENT - no lesions, no post nasal drip Neck: No JVD, no thyromegaly, no carotid bruits Lungs: no use of accessory muscles, no dullness to percussion, decreased  without rales or rhonchi  Cardiovascular: Rhythm regular, heart sounds  normal, no murmurs or gallops, no peripheral edema Abdomen: soft and non-tender, no hepatosplenomegaly, BS normal. Musculoskeletal: No deformities, no cyanosis or clubbing Neuro:  alert, non focal         Assessment & Plan:

## 2013-03-19 NOTE — Telephone Encounter (Signed)
K- will fwd to PCP

## 2013-03-19 NOTE — Assessment & Plan Note (Signed)
Lung function is not bad Her Er visits are not related to COPD but due to neurological issues More bronchodilators will only worsen her anxiety If worse dyspnea , would trial diuresis since that has helped in the past for CHF

## 2013-03-19 NOTE — Progress Notes (Signed)
PFT done today. 

## 2013-03-19 NOTE — Telephone Encounter (Signed)
I will forward this to RA.

## 2013-03-19 NOTE — Assessment & Plan Note (Signed)
LLL rounded atelectasis is stable & does not need further FU

## 2013-03-21 LAB — URINE CULTURE

## 2013-03-22 ENCOUNTER — Ambulatory Visit (INDEPENDENT_AMBULATORY_CARE_PROVIDER_SITE_OTHER): Payer: Medicare Other | Admitting: Internal Medicine

## 2013-03-22 ENCOUNTER — Telehealth: Payer: Self-pay | Admitting: Internal Medicine

## 2013-03-22 ENCOUNTER — Encounter: Payer: Self-pay | Admitting: Internal Medicine

## 2013-03-22 VITALS — BP 132/80 | HR 81 | Temp 97.7°F | Wt 171.8 lb

## 2013-03-22 DIAGNOSIS — G514 Facial myokymia: Secondary | ICD-10-CM

## 2013-03-22 DIAGNOSIS — R5383 Other fatigue: Secondary | ICD-10-CM

## 2013-03-22 DIAGNOSIS — E119 Type 2 diabetes mellitus without complications: Secondary | ICD-10-CM

## 2013-03-22 DIAGNOSIS — E039 Hypothyroidism, unspecified: Secondary | ICD-10-CM

## 2013-03-22 DIAGNOSIS — G518 Other disorders of facial nerve: Secondary | ICD-10-CM

## 2013-03-22 NOTE — Telephone Encounter (Signed)
Noted, reviewed at office visit today

## 2013-03-22 NOTE — Telephone Encounter (Signed)
[facility] Triage Call Report Triage Record Num: [id number] Operator: Sula Rumple Patient Name: Paula [name] Call Date & Time: 03/21/2013 8:19:57PM Patient Phone: [phone] PCP: Rene Paci Patient Gender: Female PCP Fax : [phone] Patient DOB: Mar 09, 1927 Practice Name: [facility] Reason for Call: Caller: [name]; PCP: Rene Paci (Adults only); CB#: [phone]; Call regarding Urinary Pain/Bleeding; Pt calling on 03/21/13 states she is on Cephalexin since 03/19/13 for UTI/today pt is not worse but is not better. Pain is improved/pt is afebrile. All emergent sx ruled out per Urinary Symptoms Protocol: See in 72 per Evaluated by provider and no improvement in symptoms after following treatment plan. Protocol(s) Used: Urinary Symptoms - Female Recommended Outcome per Protocol: See Provider within 72 Hours Reason for Outcome: Evaluated by provider AND no improvement in symptoms after following treatment plan for the time specified by provider

## 2013-03-22 NOTE — Assessment & Plan Note (Signed)
On replacement since 1993 Check TSH and adjust as needed Lab Results  Component Value Date   TSH 0.80 12/28/2012

## 2013-03-22 NOTE — Assessment & Plan Note (Signed)
eval and tx ongoing by neuro - ?benefit of prior botox injections -  pt/neuro unsure and not planning repeat injection at this time (neuro feels psychogenic)

## 2013-03-22 NOTE — Progress Notes (Signed)
Subjective:    Patient ID: Paula [name], female    DOB: Sep 24, 1927, 79 y.o.   MRN: [id number]  Fever  Associated symptoms include coughing (chronic) and nausea. Pertinent negatives include no abdominal pain, chest pain, diarrhea, headaches or vomiting.  Urinary Tract Infection  Associated symptoms include nausea. Pertinent negatives include no urgency or vomiting.  Extremity Weakness  Associated symptoms include a fever (interittent LGF).    complains of feeling weak "all over" - pending OP PT refer as HHPT not helpful  multiple visits here, other docs (neuro and pulm), and ER for same in recent weeks/months  Interval history reviewed including numerous recent office visits and overnight hospitalization 12/2012  Past Medical History  Diagnosis Date  . Depression   . Ejection fraction     EF 40%, echo, November, 2012  / in improved, EF 60%, echo, December, 2012  . Contrast media allergy     Patient feels poorly with contrast  . Status post AAA (abdominal aortic aneurysm) repair     Surgical repair, Dr. Hart Rochester, December 27, 2011  . Pre-syncope     vasovagal w/ heart block  . Parotid mass     has refused further eval 10/2011  . GERD (gastroesophageal reflux disease)   . Anxiety   . Facial tic     L sided spasms - Botox trial summer 2013, ?effective  . Macular degeneration   . AAA (abdominal aortic aneurysm)     s/p repair 12/2011  . Congestive heart failure     NL EF 10/2011 echo   . Diabetes mellitus type II, controlled   . Hypothyroidism   . Sinus bradycardia 09/19/2011    Occurring simultaneously with complete heart block   . Hypertension   . COPD (chronic obstructive pulmonary disease)   . Abdominal spasms     hemifacial-treated w/ botox xeomin -Dr Terrace Arabia  . Confusion 11/02/12    hospitalized for 10 days,amnestic fo rthe stay and did not recognize her home when husband brought her home.  . Sinus disorder tested 11/03/11    syndrome  . Syncope and collapse     Review  of Systems  Constitutional: Positive for fever (interittent LGF) and fatigue. Negative for unexpected weight change.  HENT: Positive for hoarse voice.   Respiratory: Positive for cough (chronic).   Cardiovascular: Negative for chest pain and leg swelling.  Gastrointestinal: Positive for nausea. Negative for vomiting, abdominal pain and diarrhea.  Genitourinary: Positive for dysuria. Negative for urgency, difficulty urinating and pelvic pain.  Musculoskeletal: Positive for extremity weakness.  Neurological: Positive for weakness (generalized). Negative for headaches.  Psychiatric/Behavioral: Negative for sleep disturbance and dysphoric mood.       Objective:   Physical Exam  BP 132/80  Pulse 81  Temp(Src) 97.7 F (36.5 C) (Oral)  Wt 171 lb 12.8 oz (77.928 kg)  BMI 29.47 kg/m2  SpO2 93% Wt Readings from Last 3 Encounters:  03/22/13 171 lb 12.8 oz (77.928 kg)  03/19/13 178 lb (80.74 kg)  03/10/13 174 lb (78.926 kg)   Gen: NAD, nontoxic. hoarse without change. Spouse at side Lungs: CTA B - no wheeze or crackles; diminished air movement at bases CV: RRR, no edema BLE Abd: SNTND+BS, no R/G Neurologic: awake alert and oriented x4. Moves all extremities well. No gross deficits. Walks with aid of rolling walker. Psychiatric: tangential    Lab Results  Component Value Date   WBC 12.4* 03/04/2013   HGB 11.4* 03/04/2013   HCT 34.5* 03/04/2013  PLT 294 03/04/2013   GLUCOSE 133* 03/04/2013   CHOL 149 02/10/2013   TRIG 218.0* 02/10/2013   HDL 30.10* 02/10/2013   LDLDIRECT 90.6 02/10/2013   LDLCALC 80 11/04/2011   ALT 20 03/04/2013   AST 20 03/04/2013   NA 137 03/04/2013   K 3.9 03/04/2013   CL 101 03/04/2013   CREATININE 1.09 03/04/2013   BUN 18 03/04/2013   CO2 26 03/04/2013   TSH 0.80 12/28/2012   INR 1.06 08/24/2012   HGBA1C 7.2* 02/10/2013   11/02/12 ct chest w/o CM IMPRESSION:   1.  Loculated left pleural effusion with suspected rounded atelectasis in the left lower lobe.  No specific  findings for intrathoracic malignancy identified.  If neoplasm is suspected, thoracentesis should be considered.  Otherwise, follow-up chest CT in 27-month suggested to assess stability. 2.  Evidence of prior granulomatous disease with calcified left lower lobe granuloma, calcified left hilar lymph nodes and calcified granulomas in the liver and spleen. 3.  Diffuse atherosclerosis. 4.  Cannot exclude a mesenteric mass inferior to the pancreatic tail, incompletely visualized by this examination of the chest. Further evaluation with abdominal CT to include at least oral contrast recommended.  11/10/12 CT a/p w/CM IMPRESSION:   1.  The recently demonstrated mass in the mid abdomen is well circumscribed with low density and is inferior to the pancreatic body and tail.  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 2.  Interval successful repair of the abdominal aortic aneurysm. 3.  Stable postoperative appearance of the anterior abdominal wall with small left inguinal hernia containing only fat. 4.  Stable loculated left pleural effusion and suspected left lower lobe rounded atelectasis compared with recent chest CT.  CXR 01/02/13: IMPRESSION: 1.  The appearance of the chest, as above, is suggestive of mild congestive heart failure. 2.  Bibasilar subsegmental atelectasis and small bilateral pleural effusions. 3.  Atherosclerosis.       Assessment & Plan:   Generalized fatigue and "weakness" -nonfocal neuro exam. No specific localizing signs on exam or history. Medication allergy list updated. Recent labs and OVs reviewed. follow up outpatient physical therapy to continue rehabilitation strengthening  See problem list. Medications and labs reviewed today.

## 2013-03-22 NOTE — Patient Instructions (Signed)
It was good to see you today. We have reviewed your prior records including labs and tests today Complete the antibiotics from last week - take with cracker or food to ease your stomach and nausea No other medication changes -

## 2013-03-22 NOTE — Assessment & Plan Note (Signed)
Diet controlled until 02/2011 when a1c>7 - started metformin xr 500 qd On ARB - prior statin stopped years ago (pt unsure why) Check a1c q6-70mo, adjust as needed The patient is asked to make an attempt to improve diet and exercise patterns to aid in medical management of this problem.  Lab Results  Component Value Date   HGBA1C 7.2* 02/10/2013

## 2013-03-23 ENCOUNTER — Other Ambulatory Visit: Payer: Self-pay | Admitting: *Deleted

## 2013-03-23 MED ORDER — LEVOTHYROXINE SODIUM 175 MCG PO TABS: 175.0000 ug | ORAL_TABLET | Freq: Every day | ORAL | Status: DC

## 2013-03-25 ENCOUNTER — Other Ambulatory Visit: Payer: Self-pay | Admitting: *Deleted

## 2013-03-25 MED ORDER — MELOXICAM 7.5 MG PO TABS: 7.5000 mg | ORAL_TABLET | Freq: Every day | ORAL | Status: DC

## 2013-03-26 ENCOUNTER — Telehealth: Payer: Self-pay | Admitting: *Deleted

## 2013-03-26 ENCOUNTER — Ambulatory Visit: Payer: Medicare Other | Attending: Internal Medicine | Admitting: Physical Therapy

## 2013-03-26 DIAGNOSIS — R269 Unspecified abnormalities of gait and mobility: Secondary | ICD-10-CM | POA: Insufficient documentation

## 2013-03-26 DIAGNOSIS — R5381 Other malaise: Secondary | ICD-10-CM | POA: Insufficient documentation

## 2013-03-26 DIAGNOSIS — IMO0001 Reserved for inherently not codable concepts without codable children: Secondary | ICD-10-CM | POA: Insufficient documentation

## 2013-03-26 DIAGNOSIS — M6281 Muscle weakness (generalized): Secondary | ICD-10-CM | POA: Insufficient documentation

## 2013-03-26 MED ORDER — LEVOTHYROXINE SODIUM 175 MCG PO TABS: 175.0000 ug | ORAL_TABLET | Freq: Every day | ORAL | Status: DC

## 2013-03-26 NOTE — Telephone Encounter (Signed)
Left msg on triage requesting call back from Suetta Hoffmeister. Called pt back spoke with mr. Degracia he states wife is out of her thyroid meds. [facility] rx [facility]. Inform him we sent refill on 03/23/13, but will resend again. Will also [facility] day [facility] until she received mail service...lmb

## 2013-03-29 ENCOUNTER — Ambulatory Visit: Payer: Medicare Other | Admitting: Rehabilitative and Restorative Service Providers"

## 2013-03-30 ENCOUNTER — Encounter: Payer: Self-pay | Admitting: Pulmonary Disease

## 2013-03-31 ENCOUNTER — Ambulatory Visit: Payer: Self-pay | Admitting: Neurology

## 2013-04-01 ENCOUNTER — Ambulatory Visit (INDEPENDENT_AMBULATORY_CARE_PROVIDER_SITE_OTHER): Payer: Medicare Other | Admitting: Internal Medicine

## 2013-04-01 ENCOUNTER — Encounter: Payer: Self-pay | Admitting: Internal Medicine

## 2013-04-01 VITALS — BP 122/70 | HR 76 | Temp 98.9°F | Wt 178.0 lb

## 2013-04-01 DIAGNOSIS — J438 Other emphysema: Secondary | ICD-10-CM

## 2013-04-01 DIAGNOSIS — F411 Generalized anxiety disorder: Secondary | ICD-10-CM

## 2013-04-01 DIAGNOSIS — J439 Emphysema, unspecified: Secondary | ICD-10-CM

## 2013-04-01 DIAGNOSIS — F419 Anxiety disorder, unspecified: Secondary | ICD-10-CM

## 2013-04-01 MED ORDER — ALBUTEROL SULFATE HFA 108 (90 BASE) MCG/ACT IN AERS: 2.0000 | INHALATION_SPRAY | Freq: Four times a day (QID) | RESPIRATORY_TRACT | Status: DC | PRN

## 2013-04-01 NOTE — Progress Notes (Signed)
Subjective:    Patient ID: Paula [name], female    DOB: 1927/07/17, 79 y.o.   MRN: [id number]  Wheezing  Associated symptoms include coughing (chronic), shortness of breath and swollen glands. Pertinent negatives include no abdominal pain, chest pain, diarrhea, headaches or vomiting.     Past Medical History  Diagnosis Date  . Depression   . Ejection fraction     EF 40%, echo, November, 2012  / in improved, EF 60%, echo, December, 2012  . Contrast media allergy     Patient feels poorly with contrast  . Status post AAA (abdominal aortic aneurysm) repair     Surgical repair, Dr. Hart Rochester, December 27, 2011  . Pre-syncope     vasovagal w/ heart block  . Parotid mass     has refused further eval 10/2011  . GERD (gastroesophageal reflux disease)   . Anxiety   . Facial tic     L sided spasms - Botox trial summer 2013, ?effective  . Macular degeneration   . AAA (abdominal aortic aneurysm)     s/p repair 12/2011  . Congestive heart failure     NL EF 10/2011 echo   . Diabetes mellitus type II, controlled   . Hypothyroidism   . Sinus bradycardia 09/19/2011    Occurring simultaneously with complete heart block   . Hypertension   . COPD (chronic obstructive pulmonary disease)   . Abdominal spasms     hemifacial-treated w/ botox xeomin -Dr Terrace Arabia  . Confusion 11/02/12    hospitalized for 10 days,amnestic fo rthe stay and did not recognize her home when husband brought her home.  . Sinus disorder tested 11/03/11    syndrome  . Syncope and collapse     Review of Systems  HENT: Positive for hoarse voice. Negative for congestion and sneezing.   Respiratory: Positive for cough (chronic), shortness of breath and wheezing.   Cardiovascular: Negative for chest pain, palpitations and leg swelling.  Gastrointestinal: Positive for nausea. Negative for vomiting, abdominal pain, diarrhea and constipation.  Neurological: Positive for speech difficulty (chronic "drawing up of mouth", follows with  neuro for same). Negative for headaches.  Psychiatric/Behavioral: Negative for sleep disturbance and dysphoric mood.       Objective:   Physical Exam  BP 122/70  Pulse 76  Temp(Src) 98.9 F (37.2 C) (Oral)  Wt 178 lb (80.74 kg)  BMI 30.54 kg/m2  SpO2 94% Wt Readings from Last 3 Encounters:  04/01/13 178 lb (80.74 kg)  03/22/13 171 lb 12.8 oz (77.928 kg)  03/19/13 178 lb (80.74 kg)   Gen: NAD, nontoxic. hoarse without change. Spouse at side HENT: sinuses nontender. Tympanic membranes clear bilaterally without effusion. Oropharynx with erythema and mild exudate on the right side. Nares without discharge or turbinate swelling Lungs: CTA B - no wheeze or crackles; diminished air movement at bases CV: RRR, no edema BLE Abd: SNTND+BS, no R/G Psychiatric: tangential    Lab Results  Component Value Date   WBC 12.4* 03/04/2013   HGB 11.4* 03/04/2013   HCT 34.5* 03/04/2013   PLT 294 03/04/2013   GLUCOSE 133* 03/04/2013   CHOL 149 02/10/2013   TRIG 218.0* 02/10/2013   HDL 30.10* 02/10/2013   LDLDIRECT 90.6 02/10/2013   LDLCALC 80 11/04/2011   ALT 20 03/04/2013   AST 20 03/04/2013   NA 137 03/04/2013   K 3.9 03/04/2013   CL 101 03/04/2013   CREATININE 1.09 03/04/2013   BUN 18 03/04/2013   CO2 26 03/04/2013  TSH 0.80 12/28/2012   INR 1.06 08/24/2012   HGBA1C 7.2* 02/10/2013       Assessment & Plan:   See problem list. Medications and labs reviewed today.

## 2013-04-01 NOTE — Patient Instructions (Signed)
It was good to see you today. We have reviewed your prior records including labs and tests today Refill on [facility] -No other medication changes -

## 2013-04-01 NOTE — Assessment & Plan Note (Signed)
chronic bronchitis - chronic cough symptoms Complicated by medical compliance/confusion and lack of belief in medication efficacy driving noncompliance (suspect mild dementia of spouse AND patient) Also pt has macular degeneration so depends on spouse to supply her meds No clinical evidence for infection or CHF on exam (2D echo 12/2012 normal LVEF) Recent eval OVs here and pulm + IP/ER visits reviewed; also imaging For follow up CT chest late 04/2013 to monitor loculation as seen 10/2012 (persisting post op 08/2012)  Continue pulm meds and cough suppression - reviewed prescribed med schedule at length today with spouse and pt Continue eval and and tx by pulm (last OV reviewed - symptoms felt pscy>pulm as PFTs ok)  The patient/spouse is reassured that these symptoms do not appear to represent a serious or threatening condition. Referral to [facility] 02/08/13 for consideration of CM services at home to help manage frequent visits (OP, ER and IP) related to same

## 2013-04-01 NOTE — Assessment & Plan Note (Signed)
Significant component of anxiety evident on exam (spouse and pt), likely contributing to chronic symptoms Review and reassurance provided to patient and spouse today Continue same sertraline  increased BZ to TID 02/2013 S/p counseling with gutterman 02/2013 x 1 OV, to follow up prn

## 2013-04-02 ENCOUNTER — Ambulatory Visit: Payer: Medicare Other | Admitting: Rehabilitative and Restorative Service Providers"

## 2013-04-06 ENCOUNTER — Ambulatory Visit: Payer: Medicare Other | Admitting: Rehabilitative and Restorative Service Providers"

## 2013-04-08 ENCOUNTER — Inpatient Hospital Stay (HOSPITAL_COMMUNITY)
Admission: EM | Admit: 2013-04-08 | Discharge: 2013-04-11 | DRG: 190 | Disposition: A | Discharge status: Home Health | Payer: Medicare Other | Attending: Internal Medicine | Admitting: Internal Medicine

## 2013-04-08 ENCOUNTER — Ambulatory Visit (INDEPENDENT_AMBULATORY_CARE_PROVIDER_SITE_OTHER): Payer: Medicare Other | Admitting: Neurology

## 2013-04-08 ENCOUNTER — Emergency Department (HOSPITAL_COMMUNITY): Payer: Medicare Other

## 2013-04-08 ENCOUNTER — Encounter: Payer: Self-pay | Admitting: Neurology

## 2013-04-08 ENCOUNTER — Encounter: Payer: Self-pay | Admitting: Internal Medicine

## 2013-04-08 ENCOUNTER — Ambulatory Visit (INDEPENDENT_AMBULATORY_CARE_PROVIDER_SITE_OTHER): Payer: Medicare Other | Admitting: Internal Medicine

## 2013-04-08 ENCOUNTER — Encounter (HOSPITAL_COMMUNITY): Payer: Self-pay | Admitting: Emergency Medicine

## 2013-04-08 VITALS — BP 128/78 | HR 114 | Temp 99.2°F | Ht 64.0 in | Wt 178.0 lb

## 2013-04-08 DIAGNOSIS — I714 Abdominal aortic aneurysm, without rupture, unspecified: Secondary | ICD-10-CM

## 2013-04-08 DIAGNOSIS — G518 Other disorders of facial nerve: Secondary | ICD-10-CM | POA: Diagnosis present

## 2013-04-08 DIAGNOSIS — F419 Anxiety disorder, unspecified: Secondary | ICD-10-CM

## 2013-04-08 DIAGNOSIS — F32A Depression, unspecified: Secondary | ICD-10-CM | POA: Diagnosis present

## 2013-04-08 DIAGNOSIS — Z950 Presence of cardiac pacemaker: Secondary | ICD-10-CM

## 2013-04-08 DIAGNOSIS — E039 Hypothyroidism, unspecified: Secondary | ICD-10-CM | POA: Diagnosis present

## 2013-04-08 DIAGNOSIS — Z87891 Personal history of nicotine dependence: Secondary | ICD-10-CM

## 2013-04-08 DIAGNOSIS — Z79899 Other long term (current) drug therapy: Secondary | ICD-10-CM

## 2013-04-08 DIAGNOSIS — R062 Wheezing: Secondary | ICD-10-CM

## 2013-04-08 DIAGNOSIS — J439 Emphysema, unspecified: Secondary | ICD-10-CM

## 2013-04-08 DIAGNOSIS — G9340 Encephalopathy, unspecified: Secondary | ICD-10-CM | POA: Diagnosis not present

## 2013-04-08 DIAGNOSIS — R05 Cough: Secondary | ICD-10-CM

## 2013-04-08 DIAGNOSIS — J309 Allergic rhinitis, unspecified: Secondary | ICD-10-CM

## 2013-04-08 DIAGNOSIS — J069 Acute upper respiratory infection, unspecified: Secondary | ICD-10-CM

## 2013-04-08 DIAGNOSIS — K219 Gastro-esophageal reflux disease without esophagitis: Secondary | ICD-10-CM | POA: Diagnosis present

## 2013-04-08 DIAGNOSIS — I10 Essential (primary) hypertension: Secondary | ICD-10-CM | POA: Diagnosis present

## 2013-04-08 DIAGNOSIS — J441 Chronic obstructive pulmonary disease with (acute) exacerbation: Principal | ICD-10-CM

## 2013-04-08 DIAGNOSIS — F411 Generalized anxiety disorder: Secondary | ICD-10-CM | POA: Diagnosis present

## 2013-04-08 DIAGNOSIS — G5139 Clonic hemifacial spasm, unspecified: Secondary | ICD-10-CM

## 2013-04-08 DIAGNOSIS — E119 Type 2 diabetes mellitus without complications: Secondary | ICD-10-CM

## 2013-04-08 DIAGNOSIS — F329 Major depressive disorder, single episode, unspecified: Secondary | ICD-10-CM | POA: Diagnosis present

## 2013-04-08 DIAGNOSIS — H3530 Unspecified macular degeneration: Secondary | ICD-10-CM | POA: Diagnosis present

## 2013-04-08 DIAGNOSIS — E1151 Type 2 diabetes mellitus with diabetic peripheral angiopathy without gangrene: Secondary | ICD-10-CM | POA: Diagnosis present

## 2013-04-08 DIAGNOSIS — F3289 Other specified depressive episodes: Secondary | ICD-10-CM | POA: Diagnosis present

## 2013-04-08 LAB — BASIC METABOLIC PANEL
CO2: 26 mEq/L (ref 19–32)
Chloride: 101 mEq/L (ref 96–112)
Creatinine, Ser: 0.78 mg/dL (ref 0.50–1.10)
Potassium: 3.7 mEq/L (ref 3.5–5.1)

## 2013-04-08 LAB — CBC
HCT: 35.1 % — ABNORMAL LOW (ref 36.0–46.0)
MCV: 100.9 fL — ABNORMAL HIGH (ref 78.0–100.0)
Platelets: 264 10*3/uL (ref 150–400)
RBC: 3.48 MIL/uL — ABNORMAL LOW (ref 3.87–5.11)
WBC: 14.1 10*3/uL — ABNORMAL HIGH (ref 4.0–10.5)

## 2013-04-08 LAB — TROPONIN I: Troponin I: <0.3 ng/mL (ref <0.30)

## 2013-04-08 LAB — D-DIMER, QUANTITATIVE: D-Dimer, Quant: 0.62 ug/mL-FEU — ABNORMAL HIGH (ref 0.00–0.48)

## 2013-04-08 MED ORDER — ONDANSETRON HCL 4 MG PO TABS: 4.0000 mg | ORAL_TABLET | Freq: Four times a day (QID) | ORAL | Status: DC | PRN

## 2013-04-08 MED ORDER — SODIUM CHLORIDE 0.9 % IV SOLN: INTRAVENOUS | Status: AC

## 2013-04-08 MED ORDER — IPRATROPIUM BROMIDE 0.02 % IN SOLN
0.5000 mg | Freq: Once | RESPIRATORY_TRACT | Status: AC
  Administered 2013-04-08: 0.5 mg via RESPIRATORY_TRACT
  Filled 2013-04-08: qty 2.5

## 2013-04-08 MED ORDER — DEXTROSE 5 % IV SOLN
1.0000 g | INTRAVENOUS | Status: DC
  Administered 2013-04-09 – 2013-04-10 (×2): 1 g via INTRAVENOUS
  Filled 2013-04-08 (×3): qty 10

## 2013-04-08 MED ORDER — ENOXAPARIN SODIUM 40 MG/0.4ML ~~LOC~~ SOLN
40.0000 mg | SUBCUTANEOUS | Status: DC
  Administered 2013-04-09 – 2013-04-10 (×3): 40 mg via SUBCUTANEOUS
  Filled 2013-04-08 (×4): qty 0.4

## 2013-04-08 MED ORDER — ALBUTEROL SULFATE (5 MG/ML) 0.5% IN NEBU
5.0000 mg | INHALATION_SOLUTION | Freq: Once | RESPIRATORY_TRACT | Status: AC
  Administered 2013-04-08: 5 mg via RESPIRATORY_TRACT
  Filled 2013-04-08: qty 1

## 2013-04-08 MED ORDER — HYDROCHLOROTHIAZIDE 25 MG PO TABS
25.0000 mg | ORAL_TABLET | Freq: Every day | ORAL | Status: DC
  Administered 2013-04-09 – 2013-04-11 (×3): 25 mg via ORAL
  Filled 2013-04-08 (×3): qty 1

## 2013-04-08 MED ORDER — BACLOFEN 5 MG HALF TABLET
5.0000 mg | ORAL_TABLET | Freq: Once | ORAL | Status: AC
  Administered 2013-04-08: 5 mg via ORAL
  Filled 2013-04-08: qty 1

## 2013-04-08 MED ORDER — ASPIRIN EC 81 MG PO TBEC
81.0000 mg | DELAYED_RELEASE_TABLET | Freq: Every morning | ORAL | Status: DC
  Administered 2013-04-09 – 2013-04-11 (×3): 81 mg via ORAL
  Filled 2013-04-08 (×3): qty 1

## 2013-04-08 MED ORDER — DEXTROSE 5 % IV SOLN
1.0000 g | Freq: Once | INTRAVENOUS | Status: AC
  Administered 2013-04-08: 1 g via INTRAVENOUS
  Filled 2013-04-08: qty 10

## 2013-04-08 MED ORDER — ONDANSETRON HCL 4 MG/2ML IJ SOLN
4.0000 mg | Freq: Once | INTRAMUSCULAR | Status: AC
  Administered 2013-04-08: 4 mg via INTRAVENOUS
  Filled 2013-04-08: qty 2

## 2013-04-08 MED ORDER — PREDNISONE 20 MG PO TABS: 60.0000 mg | ORAL_TABLET | Freq: Once | ORAL | Status: DC

## 2013-04-08 MED ORDER — IPRATROPIUM BROMIDE 0.02 % IN SOLN
0.5000 mg | Freq: Four times a day (QID) | RESPIRATORY_TRACT | Status: DC
  Administered 2013-04-08 – 2013-04-09 (×2): 0.5 mg via RESPIRATORY_TRACT
  Filled 2013-04-08 (×2): qty 2.5

## 2013-04-08 MED ORDER — INSULIN ASPART 100 UNIT/ML ~~LOC~~ SOLN
0.0000 [IU] | Freq: Three times a day (TID) | SUBCUTANEOUS | Status: DC
  Administered 2013-04-09: 11 [IU] via SUBCUTANEOUS
  Administered 2013-04-09: 7 [IU] via SUBCUTANEOUS
  Administered 2013-04-09: 11 [IU] via SUBCUTANEOUS
  Administered 2013-04-10: 3 [IU] via SUBCUTANEOUS
  Administered 2013-04-11 (×2): 4 [IU] via SUBCUTANEOUS

## 2013-04-08 MED ORDER — BACLOFEN 5 MG HALF TABLET
5.0000 mg | ORAL_TABLET | Freq: Every day | ORAL | Status: DC
  Administered 2013-04-09 – 2013-04-10 (×3): 5 mg via ORAL
  Filled 2013-04-08 (×4): qty 1

## 2013-04-08 MED ORDER — SODIUM CHLORIDE 0.9 % IJ SOLN
3.0000 mL | Freq: Two times a day (BID) | INTRAMUSCULAR | Status: DC
  Administered 2013-04-09: via INTRAVENOUS
  Administered 2013-04-10 (×2): 3 mL via INTRAVENOUS

## 2013-04-08 MED ORDER — ALBUTEROL SULFATE (5 MG/ML) 0.5% IN NEBU
2.5000 mg | INHALATION_SOLUTION | Freq: Four times a day (QID) | RESPIRATORY_TRACT | Status: DC
  Administered 2013-04-08 – 2013-04-11 (×10): 2.5 mg via RESPIRATORY_TRACT
  Filled 2013-04-08 (×11): qty 0.5

## 2013-04-08 MED ORDER — GUAIFENESIN ER 600 MG PO TB12
600.0000 mg | ORAL_TABLET | Freq: Every day | ORAL | Status: DC
  Administered 2013-04-09 – 2013-04-11 (×3): 600 mg via ORAL
  Filled 2013-04-08 (×3): qty 1

## 2013-04-08 MED ORDER — ALPRAZOLAM 1 MG PO TABS
1.0000 mg | ORAL_TABLET | Freq: Three times a day (TID) | ORAL | Status: DC
  Administered 2013-04-09 – 2013-04-11 (×7): 1 mg via ORAL
  Filled 2013-04-08 (×8): qty 1

## 2013-04-08 MED ORDER — LEVOTHYROXINE SODIUM 175 MCG PO TABS
175.0000 ug | ORAL_TABLET | Freq: Every day | ORAL | Status: DC
  Administered 2013-04-09 – 2013-04-11 (×3): 175 ug via ORAL
  Filled 2013-04-08 (×5): qty 1

## 2013-04-08 MED ORDER — ACETAMINOPHEN 325 MG PO TABS
650.0000 mg | ORAL_TABLET | Freq: Four times a day (QID) | ORAL | Status: DC | PRN
  Administered 2013-04-09: 650 mg via ORAL
  Filled 2013-04-08: qty 2

## 2013-04-08 MED ORDER — SODIUM CHLORIDE 0.9 % IV SOLN
INTRAVENOUS | Status: DC
  Administered 2013-04-08 – 2013-04-09 (×3): via INTRAVENOUS

## 2013-04-08 MED ORDER — SERTRALINE HCL 50 MG PO TABS
50.0000 mg | ORAL_TABLET | Freq: Every day | ORAL | Status: DC
  Administered 2013-04-09 – 2013-04-11 (×3): 50 mg via ORAL
  Filled 2013-04-08 (×3): qty 1

## 2013-04-08 MED ORDER — METHYLPREDNISOLONE SODIUM SUCC 125 MG IJ SOLR
60.0000 mg | Freq: Four times a day (QID) | INTRAMUSCULAR | Status: DC
  Administered 2013-04-09: 125 mg via INTRAVENOUS
  Administered 2013-04-09: 60 mg via INTRAVENOUS
  Filled 2013-04-08 (×6): qty 0.96

## 2013-04-08 MED ORDER — METHYLPREDNISOLONE SODIUM SUCC 125 MG IJ SOLR
125.0000 mg | Freq: Once | INTRAMUSCULAR | Status: AC
  Administered 2013-04-08: 125 mg via INTRAVENOUS
  Filled 2013-04-08: qty 2

## 2013-04-08 MED ORDER — ACETAMINOPHEN 325 MG PO TABS
650.0000 mg | ORAL_TABLET | Freq: Once | ORAL | Status: AC
  Administered 2013-04-08: 650 mg via ORAL
  Filled 2013-04-08: qty 2

## 2013-04-08 MED ORDER — PANTOPRAZOLE SODIUM 40 MG PO TBEC
40.0000 mg | DELAYED_RELEASE_TABLET | Freq: Every day | ORAL | Status: DC
  Administered 2013-04-09 – 2013-04-11 (×3): 40 mg via ORAL
  Filled 2013-04-08 (×3): qty 1

## 2013-04-08 MED ORDER — DEXTROSE 5 % IV SOLN
500.0000 mg | INTRAVENOUS | Status: DC
  Administered 2013-04-08 – 2013-04-09 (×2): 500 mg via INTRAVENOUS
  Filled 2013-04-08 (×2): qty 500

## 2013-04-08 MED ORDER — LORATADINE 10 MG PO TABS
10.0000 mg | ORAL_TABLET | Freq: Every day | ORAL | Status: DC
  Administered 2013-04-09 – 2013-04-11 (×3): 10 mg via ORAL
  Filled 2013-04-08 (×3): qty 1

## 2013-04-08 MED ORDER — ONDANSETRON HCL 4 MG/2ML IJ SOLN
4.0000 mg | Freq: Three times a day (TID) | INTRAMUSCULAR | Status: AC | PRN
  Filled 2013-04-08: qty 2

## 2013-04-08 MED ORDER — AZITHROMYCIN 250 MG PO TABS: ORAL_TABLET | ORAL | Status: DC

## 2013-04-08 MED ORDER — ONDANSETRON HCL 4 MG PO TABS: 4.0000 mg | ORAL_TABLET | Freq: Three times a day (TID) | ORAL | Status: DC | PRN

## 2013-04-08 MED ORDER — POTASSIUM CHLORIDE CRYS ER 20 MEQ PO TBCR
20.0000 meq | EXTENDED_RELEASE_TABLET | Freq: Every day | ORAL | Status: DC
  Administered 2013-04-09 – 2013-04-11 (×3): 20 meq via ORAL
  Filled 2013-04-08 (×3): qty 1

## 2013-04-08 MED ORDER — ACETAMINOPHEN 650 MG RE SUPP: 650.0000 mg | Freq: Four times a day (QID) | RECTAL | Status: DC | PRN

## 2013-04-08 MED ORDER — FUROSEMIDE 20 MG PO TABS
20.0000 mg | ORAL_TABLET | Freq: Every day | ORAL | Status: DC
  Administered 2013-04-09: 20 mg via ORAL
  Filled 2013-04-08: qty 1

## 2013-04-08 MED ORDER — METFORMIN HCL ER 500 MG PO TB24: 500.0000 mg | ORAL_TABLET | Freq: Every day | ORAL | Status: DC

## 2013-04-08 MED ORDER — MELOXICAM 7.5 MG PO TABS
7.5000 mg | ORAL_TABLET | Freq: Every day | ORAL | Status: DC
  Administered 2013-04-09: 7.5 mg via ORAL
  Filled 2013-04-08: qty 1

## 2013-04-08 MED ORDER — ONDANSETRON HCL 4 MG/2ML IJ SOLN: 4.0000 mg | Freq: Four times a day (QID) | INTRAMUSCULAR | Status: DC | PRN

## 2013-04-08 MED ORDER — CEFTRIAXONE SODIUM 1 G IJ SOLR: 1.0000 g | Freq: Once | INTRAMUSCULAR | Status: DC

## 2013-04-08 NOTE — Progress Notes (Signed)
HPI  Pt presents to the clinic today with c/o cold symptoms x 4 days. The worst part is the sore throat and dry cough. She does not produce any sputum.She is running fevers. She has tried nyquil. It does help her sleep. She does have a history of allergies and COPD. She does have sick contacts.  Review of Systems      Past Medical History  Diagnosis Date  . Depression   . Ejection fraction     EF 40%, echo, November, 2012  / in improved, EF 60%, echo, December, 2012  . Contrast media allergy     Patient feels poorly with contrast  . Status post AAA (abdominal aortic aneurysm) repair     Surgical repair, Dr. Hart Rochester, December 27, 2011  . Pre-syncope     vasovagal w/ heart block  . Parotid mass     has refused further eval 10/2011  . GERD (gastroesophageal reflux disease)   . Anxiety   . Facial tic     L sided spasms - Botox trial summer 2013, ?effective  . Macular degeneration   . AAA (abdominal aortic aneurysm)     s/p repair 12/2011  . Congestive heart failure     NL EF 10/2011 echo   . Diabetes mellitus type II, controlled   . Hypothyroidism   . Sinus bradycardia 09/19/2011    Occurring simultaneously with complete heart block   . Hypertension   . COPD (chronic obstructive pulmonary disease)   . Abdominal spasms     hemifacial-treated w/ botox xeomin -Dr Terrace Arabia  . Confusion 11/02/12    hospitalized for 10 days,amnestic fo rthe stay and did not recognize her home when husband brought her home.  . Sinus disorder tested 11/03/11    syndrome  . Syncope and collapse     Family History  Problem Relation Age of Onset  . Esophageal cancer Father   . Cancer Father   . Breast cancer Sister   . Cancer Sister   . Colon cancer Sister   . Heart disease Sister   . Heart disease Mother   . Heart disease Son     History   Social History  . Marital Status: Married    Spouse Name: N/A    Number of Children: 4  . Years of Education: N/A   Occupational History  . retired  Other    worked for 25 years, hairdresser   Social History Main Topics  . Smoking status: Former Smoker -- 1.00 packs/day for 30 years    Types: Cigarettes    Quit date: 08/31/2011  . Smokeless tobacco: Never Used  . Alcohol Use: No  . Drug Use: No  . Sexually Active: Not on file   Other Topics Concern  . Not on file   Social History Narrative  . No narrative on file    Allergies  Allergen Reactions  . Bactrim (Sulfamethoxazole-Tmp Ds)   . Contrast Media (Iodinated Diagnostic Agents) Anaphylaxis  . Gadolinium Derivatives   . Iohexol Anaphylaxis, Shortness Of Breath and Swelling     Desc: PT STATES SHE HAD A SEVERE REACTION TO IV CONRAST WITH THROAT SWELLING AND SOB. SHE WAS ADMITTED TO THE HOSPITAL. SHE HAS NEVER HAD CONTRAST AGAIN.   Marland Kitchen Penicillins Rash  . Sulfa Antibiotics Anaphylaxis  . Avelox (Moxifloxacin Hcl In Nacl) Rash  . Ciprofloxacin Itching  . Delsym (Dextromethorphan) Other (See Comments)    Hallucination  . Hydromet (Hydrocodone-Homatropine) Other (See Comments)    Pt  states med make her hallucinate     Constitutional: Positive headache, fatigue and fever. Denies abrupt weight changes.  HEENT:  Positive sore throat. Denies eye redness, eye pain, pressure behind the eyes, facial pain, nasal congestion, ear pain, ringing in the ears, wax buildup, runny nose or bloody nose. Respiratory: Positive cough. Denies difficulty breathing or shortness of breath.  Cardiovascular: Denies chest pain, chest tightness, palpitations or swelling in the hands or feet.   No other specific complaints in a complete review of systems (except as listed in HPI above).  Objective:   BP 128/78  Pulse 114  Temp(Src) 99.2 F (37.3 C) (Oral)  Ht 5\' 4"  (1.626 m)  Wt 178 lb (80.74 kg)  BMI 30.54 kg/m2  SpO2 91% Wt Readings from Last 3 Encounters:  04/08/13 178 lb (80.74 kg)  04/01/13 178 lb (80.74 kg)  03/22/13 171 lb 12.8 oz (77.928 kg)     General: Appears her stated age,  well developed, well nourished in NAD. HEENT: Head: normal shape and size; Eyes: sclera white, no icterus, conjunctiva pink, PERRLA and EOMs intact; Ears: Tm's gray and intact, normal light reflex; Nose: mucosa pink and moist, septum midline; Throat/Mouth: + PND. Teeth present, mucosa erythematous and moist, no exudate noted, no lesions or ulcerations noted.  Neck: Mild cervical lymphadenopathy. Neck supple, trachea midline. No massses, lumps or thyromegaly present.  Cardiovascular: Normal rate and rhythm. S1,S2 noted.  No murmur, rubs or gallops noted. No JVD or BLE edema. No carotid bruits noted. Pulmonary/Chest: Normal effort and intermittent expiratory wheeze on the left side. No respiratory distress. No wheezes, rales or ronchi noted.      Assessment & Plan:   Upper Respiratory Infection, new onset with additional workup required:  Get some rest and drink plenty of water Do salt water gargles for the sore throat eRx for Azithromax x 5 days Continue Nyquil at night Tylenol as needed for fever  Allergic Rhinitis, recurrent:  Continue Zyrtec and proventil  RTC as needed or if symptoms persist.

## 2013-04-08 NOTE — Progress Notes (Signed)
History of Present Illness   Paula [name] is a a 79 years old right-handed Caucasian female, referred by Dr. Vickey Huger for the evaluation of EMG guided bottulinumtoxin injection for left hemifacial spasm, she is accompanied by her husband.  She has past medical history of diet-controlled diabetes, coronary artery disease, husband reported 3 episode of sudden loss of consciousness from October to December 2012, "telemetry went flat", she also underwent aortic aneurysm repair  in December 27, 2011,She reported 6 years history of gradual onset left facial spasm, she denies history of left Bell's palsy, initial spasm involving left eye, now spreading to involving her whole left face, bothersome to her, she actually presented to the emergency room twice recently, because severe left facial twitching, to the point of causing anxiety, " I was so scared" She denied left facial sensory change, she has poor vision due to macular degeneration bilaterally, reported acute worsening of right vision since most recent aortic aneurysm repair,She also reports few weeks history of acute worsening gait difficulty, stuttering of her speech,I reviewed most recent MRI of brain in December 2012, there was mild atrophy, extensive periventricular white matter disease, MRA of the brain showed no large vessel disease.  I performed EMG guided xeomin injection into her left face in  07/14/2012, 22.5 unit, she responded very well, there was no significant side effect, because she was doing so well, I do not perform scheduled injection in February 2014,   She came in earlier than expected today, complains of coughing, mild fever, not feeling well, was just evaluated by her primary care physician, does not want to have injection today she denied recurrence of her left facial spasm.  She was reevaluated by Dr. Vickey Huger in April 28th 2014.  Physical Exam  General: well developed, well  nourished, seated,anxious-looking, intermittent wet  cough  Head: head  normocephalic and atraumatic.  Oropharynx benign. Ears, Nose and Throat: [id number]  Neck: supple, no carotid bruits  Respiratory: clear to auscultation bilaterally Cardiovascular: regular rate rhythm Skin: no edema   Neurologic Exam  Mental Status: elderly caucasain female, awake, alert,  Cranial Nerves: PERL. EOM intact without nystamus. Visual fields full. Hearing intack.  I did not notice any left facial twitching today There was no significant asymmetry of her face. Motor:  no significant weakness in the extremities   Sensory: Normal to light touch, pinprick, and vibratory in all extremities.   Coordination:  There was no dysmetria noticed. Gait and Station: push up from seated position, cautious,  mild unsteady, dragging right leg, Enblock turning,  No assistive device Reflexes: Deep tendon reflexes:2+.  Plantar responses are flexor.   Assessment and Plan: 79 year old with long-standing left hemifacial spasm  three years ago onset after oral surgery. Side effects  to CBZ and stopped. Taking Xanax 4 times daily with reductionin spasms. Recent  AAA surgery in Feb 2013.   she has no significant left facial spasm,after discussed with patient and her husband,we decided to hold off injection this time, she will contact my office  Only if she has recurrent left facial spasm, wants to have injection again

## 2013-04-08 NOTE — ED Notes (Signed)
[id number]<AV> Expected date:<BR> Expected time:<BR> Means of arrival:<BR> Comments:<BR> ems-seen by PCP not happy

## 2013-04-08 NOTE — Patient Instructions (Signed)

## 2013-04-08 NOTE — ED Notes (Signed)
Attempted to call 5E with no answer

## 2013-04-08 NOTE — H&P (Signed)
[facility] History and Physical  Paula [name]:[id number] DOB: 1927-01-19 DOA: 04/08/2013  Referring physician:   PCP: Rene Paci, MD   Chief Complaint:  Shortness of breath  HPI:  79 y.o. female with a hx of COPD, pneumonia, DM, GERD, anxiety, HTN, hypothyroid presents to the Emergency Department complaining of gradual, persistent, progressively worsening wheezing and SOB beginning 24 hours ago. Oxygen saturation was 87% on room air, 92% on 2 L of oxygen. She was found to have significant wheezing and shortness of breath at rest. Pt c/o associated cough and fatigue. Pt has been using her home nebulizer and combivent without relief and exertion seems to make it better. Pt also endorses low grade fever below 100F at home. She was seen by the FNP at her PCP this morning who diagnosed her with a URI but did not do a CXR. Pt states she was sent home with azithromycin and took the first dose today, but is adamant that this medication does not work for her. Pt denies chills, headache, neck pain, chest pain, abdominal pain, N/V/D, weakness, dizziness, syncope, dysuria. Pt sees Dr Myrtis Ser for her cardiology follow-up. She has a Hx of CHF with a normal EF in 2012.    She has past medical history of diet-controlled diabetes, coronary artery disease, husband reported 3 episode of sudden loss of consciousness from October to December 2012, "telemetry went flat", she also underwent aortic aneurysm repair in December 27, 2011,She reported 6 years history of gradual onset left facial spasm, she denies history of left Bell's palsy, initial spasm involving left eye, now spreading to involving her whole left face, bothersome to her, she actually presented to the emergency room twice recently, because severe left facial twitching, to the point of causing anxiety,today she denied recurrence of her left facial spasm        Review of Systems: negative for the following  Constitutional: Positive for  fever and fatigue.  HENT: Positive for congestion. Negative for sore throat, rhinorrhea, sneezing and neck pain.  Respiratory: Positive for cough and wheezing Gastrointestinal: Denies nausea, vomiting, abdominal pain, diarrhea, constipation, blood in stool and abdominal distention.  Genitourinary: Denies dysuria, urgency, frequency, hematuria, flank pain and difficulty urinating.  Musculoskeletal: Denies myalgias, back pain, joint swelling, arthralgias and gait problem.  Skin: Denies pallor, rash and wound.  Neurological: Denies dizziness, seizures, syncope, weakness, light-headedness, numbness and headaches.  Hematological: Denies adenopathy. Easy bruising, personal or family bleeding history  Psychiatric/Behavioral: Denies suicidal ideation, mood changes, confusion, nervousness, sleep disturbance and agitation       Past Medical History  Diagnosis Date  . Depression   . Ejection fraction     EF 40%, echo, November, 2012  / in improved, EF 60%, echo, December, 2012  . Contrast media allergy     Patient feels poorly with contrast  . Status post AAA (abdominal aortic aneurysm) repair     Surgical repair, Dr. Hart Rochester, December 27, 2011  . Pre-syncope     vasovagal w/ heart block  . Parotid mass     has refused further eval 10/2011  . GERD (gastroesophageal reflux disease)   . Anxiety   . Facial tic     L sided spasms - Botox trial summer 2013, ?effective  . Macular degeneration   . AAA (abdominal aortic aneurysm)     s/p repair 12/2011  . Congestive heart failure     NL EF 10/2011 echo   . Diabetes mellitus type II, controlled   .  Hypothyroidism   . Sinus bradycardia 09/19/2011    Occurring simultaneously with complete heart block   . Hypertension   . COPD (chronic obstructive pulmonary disease)   . Abdominal spasms     hemifacial-treated w/ botox xeomin -Dr Terrace Arabia  . Confusion 11/02/12    hospitalized for 10 days,amnestic fo rthe stay and did not recognize her home when husband  brought her home.  . Sinus disorder tested 11/03/11    syndrome  . Syncope and collapse      Past Surgical History  Procedure Laterality Date  . Cholecystectomy    . Knee cartilage surgery      left  . Sinus surgery with instatrak    . Appendectomy    . Oophorectomy      ovarian cyst  . Cataract extraction      bilateral  . Cardiac catheterization  11/04/11  . Bladder repair    . US echocardiography  10/14/11  . Abdominal aortic aneurysm repair  12/27/2011    Procedure: ANEURYSM ABDOMINAL AORTIC REPAIR;  Surgeon: Josephina Gip, MD;  Location: [facility];  Service: Vascular;  Laterality: N/A;  Resection and Grafting Abdominal Aortic Aneurysm , Aorta Bi Iliac.  Marland Kitchen Pacemaker insertion  11/12      Social History:  reports that she quit smoking about 19 months ago. Her smoking use included Cigarettes. She has a 30 pack-year smoking history. She has never used smokeless tobacco. She reports that she does not drink alcohol or use illicit drugs.    Allergies  Allergen Reactions  . Bactrim (Sulfamethoxazole-Tmp Ds)   . Contrast Media (Iodinated Diagnostic Agents) Anaphylaxis  . Gadolinium Derivatives   . Iohexol Anaphylaxis, Shortness Of Breath and Swelling     Desc: PT STATES SHE HAD A SEVERE REACTION TO IV CONRAST WITH THROAT SWELLING AND SOB. SHE WAS ADMITTED TO THE HOSPITAL. SHE HAS NEVER HAD CONTRAST AGAIN.   Marland Kitchen Penicillins Rash  . Sulfa Antibiotics Anaphylaxis  . Avelox (Moxifloxacin Hcl In Nacl) Rash  . Ciprofloxacin Itching  . Delsym (Dextromethorphan) Other (See Comments)    Hallucination  . Hydromet (Hydrocodone-Homatropine) Other (See Comments)    Pt states med make her hallucinate    Family History  Problem Relation Age of Onset  . Esophageal cancer Father   . Cancer Father   . Breast cancer Sister   . Cancer Sister   . Colon cancer Sister   . Heart disease Sister   . Heart disease Mother   . Heart disease Son      Prior to Admission medications   Medication  Sig Start Date End Date Taking? Authorizing Provider  albuterol (PROVENTIL HFA;VENTOLIN HFA) 108 (90 BASE) MCG/ACT inhaler Inhale 2 puffs into the lungs every 6 (six) hours as needed for wheezing. 04/01/13  Yes Newt Lukes, MD  ALPRAZolam Prudy Feeler) 1 MG tablet Take 1 tablet (1 mg total) by mouth 3 (three) times daily. 03/08/13  Yes Melvyn Novas, MD  aspirin EC 81 MG tablet Take 81 mg by mouth every morning.    Yes Historical Provider, MD  azithromycin (ZITHROMAX) 250 MG tablet Take 250-500 mg by mouth daily. 5 day course of therapy started 04/08/13; 2 tabs on Day 1, then 1 tab daily until gone.   Yes Historical Provider, MD  baclofen (LIORESAL) 10 MG tablet Take 0.5 tablets (5 mg total) by mouth at bedtime. 03/15/13  Yes Newt Lukes, MD  calcium carbonate (OS-CAL) 600 MG TABS Take 600 mg by mouth every other day.  Yes Historical Provider, MD  cetirizine (ZYRTEC) 10 MG tablet Take 1 tablet (10 mg total) by mouth daily. 01/01/13  Yes Newt Lukes, MD  Cholecalciferol (VITAMIN D3) 5000 UNITS TABS Take 1 tablet by mouth every other day.    Yes Historical Provider, MD  furosemide (LASIX) 20 MG tablet Take 1 tablet (20 mg total) by mouth daily. 03/03/13  Yes Luis Abed, MD  guaiFENesin (MUCINEX) 600 MG 12 hr tablet Take 600 mg by mouth daily.   Yes Historical Provider, MD  hydrochlorothiazide (HYDRODIURIL) 25 MG tablet Take 25 mg by mouth daily.   Yes Historical Provider, MD  Ipratropium-Albuterol (COMBIVENT) 20-100 MCG/ACT AERS respimat Inhale 1 puff into the lungs every 6 (six) hours as needed for wheezing or shortness of breath. 01/27/13  Yes Newt Lukes, MD  levothyroxine (SYNTHROID, LEVOTHROID) 175 MCG tablet Take 1 tablet (175 mcg total) by mouth daily. 03/26/13  Yes Newt Lukes, MD  losartan-hydrochlorothiazide (HYZAAR) 50-12.5 MG per tablet Take 1 tablet by mouth daily. 10/28/12  Yes Newt Lukes, MD  meloxicam (MOBIC) 7.5 MG tablet Take 1 tablet (7.5 mg total)  by mouth daily. 03/25/13  Yes Newt Lukes, MD  metFORMIN (GLUCOPHAGE-XR) 500 MG 24 hr tablet Take 1 tablet (500 mg total) by mouth daily with breakfast. 04/08/13  Yes Nicki Reaper, NP  ondansetron (ZOFRAN) 4 MG tablet Take 1 tablet (4 mg total) by mouth every 8 (eight) hours as needed for nausea. 11/10/12  Yes Newt Lukes, MD  pantoprazole (PROTONIX) 40 MG tablet Take 1 tablet (40 mg total) by mouth daily. 03/17/13  Yes Newt Lukes, MD  potassium chloride SA (K-DUR,KLOR-CON) 20 MEQ tablet Take 1 tablet (20 mEq total) by mouth daily. 03/15/13  Yes Newt Lukes, MD  sertraline (ZOLOFT) 50 MG tablet Take 1 tablet (50 mg total) by mouth daily. 02/16/13  Yes Newt Lukes, MD     Physical Exam: Filed Vitals:   04/08/13 1702 04/08/13 2012 04/08/13 2014 04/08/13 2016  BP:  138/64    Pulse:   115   Temp:      TempSrc:      Resp:      SpO2: 92%  88% 93%     Constitutional: Vital signs reviewed. Patient is a well-developed and well-nourished in no acute distress and cooperative with exam. Alert and oriented x3.  Head: Normocephalic and atraumatic  Ear: TM normal bilaterally  Mouth: no erythema or exudates, MMM  Eyes: PERRL, EOMI, conjunctivae normal, No scleral icterus.  Neck: Supple, Trachea midline normal ROM, No JVD, mass, thyromegaly, or carotid bruit present.  Cardiovascular: RRR, S1 normal, S2 normal, no MRG, pulses symmetric and intact bilaterally  Pulmonary/Chest: No accessory muscle usage or stridor. Tachypnea noted. She has no decreased breath sounds. She has wheezes. She has rhonchi. She has no rales. She exhibits no tenderness and no bony tenderness.  Abdominal: Soft. Non-tender, non-distended, bowel sounds are normal, no masses, organomegaly, or guarding present.  GU: no CVA tenderness Musculoskeletal: No joint deformities, erythema, or stiffness, ROM full and no nontender Ext: no edema and no cyanosis, pulses palpable bilaterally (DP and PT)  Hematology: no  cervical, inginal, or axillary adenopathy.  Neurological: A&O x3, Strenght is normal and symmetric bilaterally, cranial nerve II-XII are grossly intact, no focal motor deficit, sensory intact to light touch bilaterally.  Skin: Warm, dry and intact. No rash, cyanosis, or clubbing.  Psychiatric: Normal mood and affect. speech and behavior is normal. Judgment and  thought content normal. Cognition and memory are normal.       Labs on Admission:    Basic Metabolic Panel:  Recent Labs Lab 04/08/13 1729  NA 139  K 3.7  CL 101  CO2 26  GLUCOSE 174*  BUN 13  CREATININE 0.78  CALCIUM 9.9   Liver Function Tests: No results found for this basename: AST, ALT, ALKPHOS, BILITOT, PROT, ALBUMIN,  in the last 168 hours No results found for this basename: LIPASE, AMYLASE,  in the last 168 hours No results found for this basename: AMMONIA,  in the last 168 hours CBC:  Recent Labs Lab 04/08/13 1729  WBC 14.1*  HGB 11.3*  HCT 35.1*  MCV 100.9*  PLT 264   Cardiac Enzymes: No results found for this basename: CKTOTAL, CKMB, CKMBINDEX, TROPONINI,  in the last 168 hours  BNP (last 3 results)  Recent Labs  01/02/13 1628  PROBNP 725.1*      CBG: No results found for this basename: GLUCAP,  in the last 168 hours  Radiological Exams on Admission: Dg Chest 2 View  04/08/2013   *RADIOLOGY REPORT*  Clinical Data: Cough, wheezing and shortness of breath.  CHEST - 2 VIEW  Comparison: Chest x-ray 03/04/2013.  Findings: Lungs appear hyperexpanded and there is diffuse peribronchial cuffing.  No acute consolidative airspace disease. Blunting of the left costophrenic sulcus is unchanged and appears to represent some chronic pleuroparenchymal scarring.  Mild crowding of the pulmonary vasculature, without frank pulmonary edema.  Heart size is mildly enlarged.  Upper mediastinal contours are similar to prior study is generally unremarkable. Atherosclerosis in the thoracic aorta.  IMPRESSION: 1.  Mild  diffuse bronchial wall thickening and mild hyperinflation of the lungs may reflect a acute or chronic bronchitis. 2.  Mild cardiomegaly. 3.  Atherosclerosis. 4.  Probable chronic left pleuroparenchymal scarring redemonstrated.   Original Report Authenticated By: Trudie Reed, M.D.    EKG: Independently reviewed.   Assessment/Plan Principal Problem:   COPD exacerbation Active Problems:   Diabetes mellitus type II, controlled   Hypothyroidism   Depression   Anxiety   Chronic asthmatic bronchitis with acute exacerbation   COPD exacerbation Pt given steroids, duoneb x3 and rocephin 1g IV. Pt took Azithromycin 500mg  PO this morning after being seen by the PCP. CXR negative for acute infiltrate Continue nebulizer treatments every 6 hours Solu-Medrol 60 mg IV every 6 We'll check a d-dimer If elevated we'll obtain a CTA chest  Diabetes mellitus type 2 On metformin start the patient on sliding scale insulin  Facial spasms Patient takes Xanax for this  Hypertension Continue losartan/HCTZ   Code Status:   full Family Communication: bedside Disposition Plan: admit   Time spent: 70 mins   [facility] Pager [phone]  If 7PM-7AM, please contact night-coverage [email or web address] Password [facility] 04/08/2013, 9:35 PM

## 2013-04-08 NOTE — ED Notes (Signed)
Per EMS: pt went to PCP for evaluation this morning and was told she had an URI and given antibiotics. Took two tablets. Pt states she wasn't satisfied by her evaluation at PCP and wanted to be reevaluated.

## 2013-04-08 NOTE — ED Notes (Signed)
MD at bedside speaking to pt and family at this time

## 2013-04-08 NOTE — ED Provider Notes (Signed)
History     CSN: [id number]  Arrival date & time 04/08/13  1635   First MD Initiated Contact with Patient 04/08/13 1643      Chief Complaint  Patient presents with  . URI    (Consider location/radiation/quality/duration/timing/severity/associated sxs/prior treatment) Patient is a 79 y.o. female presenting with URI. The history is provided by the patient and medical records. No language interpreter was used.  URI Presenting symptoms: congestion, cough, fatigue and fever   Presenting symptoms: no facial pain, no rhinorrhea and no sore throat   Severity:  Moderate Onset quality:  Gradual Duration:  1 day Timing:  Constant Progression:  Worsening Chronicity:  New Relieved by:  Nothing Ineffective treatments:  Inhaler, nebulizer treatments and prescription medications Associated symptoms: wheezing   Associated symptoms: no arthralgias, no headaches, no myalgias, no neck pain, no sinus pain, no sneezing and no swollen glands   Risk factors: being elderly, chronic respiratory disease and diabetes mellitus   Risk factors: no recent illness, no recent travel and no sick contacts     Paula [name] is a 79 y.o. female  with a hx of COPD, pneumonia, DM, GERD, anxiety, HTN, hypothyroid presents to the Emergency Department complaining of gradual, persistent, progressively worsening wheezing and SOB beginning 24 hours ago.  Pt c/o associated cough and fatigue.   Pt has been using her home nebulizer and combivent without relief and exertion seems to make it better.  Pt also endorses low grade fever below 100F at home.  She was seen by the FNP at her PCP this morning who diagnosed her with a URI but did not do a CXR.  Pt states she was sent home with azithromycin and took the first dose today, but is adamant that this medication does not work for her. Pt denies chills, headache, neck pain, chest pain, abdominal pain, N/V/D, weakness, dizziness, syncope, dysuria.  Pt sees Dr Myrtis Ser for her cardiology  follow-up.  She has a Hx of CHF with a normal EF in 2012.     Past Medical History  Diagnosis Date  . Depression   . Ejection fraction     EF 40%, echo, November, 2012  / in improved, EF 60%, echo, December, 2012  . Contrast media allergy     Patient feels poorly with contrast  . Status post AAA (abdominal aortic aneurysm) repair     Surgical repair, Dr. Hart [place], December 27, 2011  . Pre-syncope     vasovagal w/ heart block  . Parotid mass     has refused further eval 10/2011  . GERD (gastroesophageal reflux disease)   . Anxiety   . Facial tic     L sided spasms - Botox trial summer 2013, ?effective  . Macular degeneration   . AAA (abdominal aortic aneurysm)     s/p repair 12/2011  . Congestive heart failure     NL EF 10/2011 echo   . Diabetes mellitus type II, controlled   . Hypothyroidism   . Sinus bradycardia 09/19/2011    Occurring simultaneously with complete heart block   . Hypertension   . COPD (chronic obstructive pulmonary disease)   . Abdominal spasms     hemifacial-treated w/ botox xeomin -Dr Terrace Arabia  . Confusion 11/02/12    hospitalized for 10 days,amnestic fo rthe stay and did not recognize her home when husband brought her home.  . Sinus disorder tested 11/03/11    syndrome  . Syncope and collapse     Past Surgical History  Procedure Laterality Date  . Cholecystectomy    . Knee cartilage surgery      left  . Sinus surgery with instatrak    . Appendectomy    . Oophorectomy      ovarian cyst  . Cataract extraction      bilateral  . Cardiac catheterization  11/04/11  . Bladder repair    . US echocardiography  10/14/11  . Abdominal aortic aneurysm repair  12/27/2011    Procedure: ANEURYSM ABDOMINAL AORTIC REPAIR;  Surgeon: Josephina Gip, MD;  Location: [facility] OR;  Service: Vascular;  Laterality: N/A;  Resection and Grafting Abdominal Aortic Aneurysm , Aorta Bi Iliac.  Marland Kitchen Pacemaker insertion  11/12    Family History  Problem Relation Age of Onset  . Esophageal  cancer Father   . Cancer Father   . Breast cancer Sister   . Cancer Sister   . Colon cancer Sister   . Heart disease Sister   . Heart disease Mother   . Heart disease Son     History  Substance Use Topics  . Smoking status: Former Smoker -- 1.00 packs/day for 30 years    Types: Cigarettes    Quit date: 08/31/2011  . Smokeless tobacco: Never Used  . Alcohol Use: No    OB History   Grav Para Term Preterm Abortions TAB SAB Ect Mult Living                  Review of Systems  Constitutional: Positive for fever and fatigue.  HENT: Positive for congestion. Negative for sore throat, rhinorrhea, sneezing and neck pain.   Respiratory: Positive for cough and wheezing.   Gastrointestinal: Negative for abdominal pain and anal bleeding.  Endocrine: Negative for polydipsia, polyphagia and polyuria.  Genitourinary: Negative for dysuria, urgency and frequency.  Musculoskeletal: Negative for myalgias and arthralgias.  Allergic/Immunologic: Negative for immunocompromised state.  Neurological: Negative for weakness and headaches.  Hematological: Does not bruise/bleed easily.  Psychiatric/Behavioral: The patient is not nervous/anxious.     Allergies  Bactrim; Contrast media; Gadolinium derivatives; Iohexol; Penicillins; Sulfa antibiotics; Avelox; Ciprofloxacin; Delsym; and Hydromet  Home Medications   Current Outpatient Rx  Name  Route  Sig  Dispense  Refill  . albuterol (PROVENTIL HFA;VENTOLIN HFA) 108 (90 BASE) MCG/ACT inhaler   Inhalation   Inhale 2 puffs into the lungs every 6 (six) hours as needed for wheezing.   1 Inhaler   2   . ALPRAZolam (XANAX) 1 MG tablet   Oral   Take 1 tablet (1 mg total) by mouth 3 (three) times daily.   270 tablet   0   . aspirin EC 81 MG tablet   Oral   Take 81 mg by mouth every morning.          Marland Kitchen azithromycin (ZITHROMAX) 250 MG tablet   Oral   Take 250-500 mg by mouth daily. 5 day course of therapy started 04/08/13; 2 tabs on Day 1, then  1 tab daily until gone.         . baclofen (LIORESAL) 10 MG tablet   Oral   Take 0.5 tablets (5 mg total) by mouth at bedtime.   90 each   1   . calcium carbonate (OS-CAL) 600 MG TABS   Oral   Take 600 mg by mouth every other day.          . cetirizine (ZYRTEC) 10 MG tablet   Oral   Take 1 tablet (10 mg total) by  mouth daily.   30 tablet   11   . Cholecalciferol (VITAMIN D3) 5000 UNITS TABS   Oral   Take 1 tablet by mouth every other day.          . furosemide (LASIX) 20 MG tablet   Oral   Take 1 tablet (20 mg total) by mouth daily.   90 tablet   1   . guaiFENesin (MUCINEX) 600 MG 12 hr tablet   Oral   Take 600 mg by mouth daily.         . hydrochlorothiazide (HYDRODIURIL) 25 MG tablet   Oral   Take 25 mg by mouth daily.         . Ipratropium-Albuterol (COMBIVENT) 20-100 MCG/ACT AERS respimat   Inhalation   Inhale 1 puff into the lungs every 6 (six) hours as needed for wheezing or shortness of breath.         . levothyroxine (SYNTHROID, LEVOTHROID) 175 MCG tablet   Oral   Take 1 tablet (175 mcg total) by mouth daily.   30 tablet   0   . losartan-hydrochlorothiazide (HYZAAR) 50-12.5 MG per tablet   Oral   Take 1 tablet by mouth daily.   90 tablet   3   . meloxicam (MOBIC) 7.5 MG tablet   Oral   Take 1 tablet (7.5 mg total) by mouth daily.   90 tablet   1   . metFORMIN (GLUCOPHAGE-XR) 500 MG 24 hr tablet   Oral   Take 1 tablet (500 mg total) by mouth daily with breakfast.   90 tablet   2   . ondansetron (ZOFRAN) 4 MG tablet   Oral   Take 1 tablet (4 mg total) by mouth every 8 (eight) hours as needed for nausea.   20 tablet   1   . pantoprazole (PROTONIX) 40 MG tablet   Oral   Take 1 tablet (40 mg total) by mouth daily.   90 tablet   1   . potassium chloride SA (K-DUR,KLOR-CON) 20 MEQ tablet   Oral   Take 1 tablet (20 mEq total) by mouth daily.   90 tablet   1   . sertraline (ZOLOFT) 50 MG tablet   Oral   Take 1 tablet (50  mg total) by mouth daily.   90 tablet   1     BP 138/64  Pulse 115  Temp(Src) 98.7 F (37.1 C) (Oral)  Resp 22  SpO2 93%  Physical Exam  Constitutional: She is oriented to person, place, and time. She appears well-developed and well-nourished. No distress.  HENT:  Head: Normocephalic and atraumatic.  Right Ear: Tympanic membrane, external ear and ear canal normal.  Left Ear: Tympanic membrane, external ear and ear canal normal.  Nose: Mucosal edema and rhinorrhea present. No epistaxis. Right sinus exhibits no maxillary sinus tenderness and no frontal sinus tenderness. Left sinus exhibits no maxillary sinus tenderness and no frontal sinus tenderness.  Mouth/Throat: Uvula is midline, oropharynx is clear and moist and mucous membranes are normal. Mucous membranes are not pale and not cyanotic. No oropharyngeal exudate, posterior oropharyngeal edema, posterior oropharyngeal erythema or tonsillar abscesses.  Eyes: Conjunctivae and EOM are normal. Pupils are equal, round, and reactive to light.  Neck: Normal range of motion and full passive range of motion without pain.  Cardiovascular: Regular rhythm, S1 normal, S2 normal, normal heart sounds and intact distal pulses.  Tachycardia present.   No murmur heard. Pulses:      Radial  pulses are 2+ on the right side, and 2+ on the left side.       Dorsalis pedis pulses are 2+ on the right side, and 2+ on the left side.       Posterior tibial pulses are 2+ on the right side, and 2+ on the left side.  Pulmonary/Chest: No accessory muscle usage or stridor. Tachypnea noted. She has no decreased breath sounds. She has wheezes. She has rhonchi. She has no rales. She exhibits no tenderness and no bony tenderness.  Abdominal: Soft. Bowel sounds are normal. There is no tenderness.  Well healed midline scar; no tenderness to palpation  Musculoskeletal: Normal range of motion. She exhibits no edema and no tenderness.  Lymphadenopathy:    She has no  cervical adenopathy.  Neurological: She is alert and oriented to person, place, and time. She exhibits normal muscle tone. Coordination normal.  Skin: Skin is dry. No rash noted. She is not diaphoretic.  Psychiatric: She has a normal mood and affect.    ED Course  Procedures (including critical care time)  Labs Reviewed  CBC - Abnormal; Notable for the following:    WBC 14.1 (*)    RBC 3.48 (*)    Hemoglobin 11.3 (*)    HCT 35.1 (*)    MCV 100.9 (*)    All other components within normal limits  BASIC METABOLIC PANEL - Abnormal; Notable for the following:    Glucose, Bld 174 (*)    GFR calc non Af Amer 75 (*)    GFR calc Af Amer 87 (*)    All other components within normal limits   Dg Chest 2 View  04/08/2013   *RADIOLOGY REPORT*  Clinical Data: Cough, wheezing and shortness of breath.  CHEST - 2 VIEW  Comparison: Chest x-ray 03/04/2013.  Findings: Lungs appear hyperexpanded and there is diffuse peribronchial cuffing.  No acute consolidative airspace disease. Blunting of the left costophrenic sulcus is unchanged and appears to represent some chronic pleuroparenchymal scarring.  Mild crowding of the pulmonary vasculature, without frank pulmonary edema.  Heart size is mildly enlarged.  Upper mediastinal contours are similar to prior study is generally unremarkable. Atherosclerosis in the thoracic aorta.  IMPRESSION: 1.  Mild diffuse bronchial wall thickening and mild hyperinflation of the lungs may reflect a acute or chronic bronchitis. 2.  Mild cardiomegaly. 3.  Atherosclerosis. 4.  Probable chronic left pleuroparenchymal scarring redemonstrated.   Original Report Authenticated By: Trudie Reed, M.D.     1. Wheezing   2. COPD exacerbation   3. URI (upper respiratory infection)       MDM  Paula [name] presents with c/o URI, wheezing and mild hypoxia.  Concern for PNA as pt has a hx and is high risk with a Hx of COPD and DM.  Pt with O2 saturations at 87% on RA while sitting in  bed.  O2 saturation increased to 92% on 2L via [place].  Pt with significant wheezing and SOB at rest; will initiate albuterol inhaler and obtain CXR.    Pt given steroids, duoneb x3 and rocephin 1g IV.  Pt took Azithromycin 500mg  PO this morning after being seen by the PCP.  CXR negative for acute infiltrate. I personally reviewed the imaging tests through PACS system.  I reviewed available ER/hospitalization records through the EMR. Pt with mild leukocytosis on CBC.   Pt reports not feeling better, though her wheezing has resolved with 3 treatments.  She currently requires 3L via [place] to maintain  O2 saturations above 90% and becomes dyspneic on ambulation.  Will admit for overnight observation and albuterol nebs as needed.    Dr. Freida Busman was consulted, evaluated this patient with me and agrees with the plan.             Dahlia Client Tawsha Terrero, PA-C 04/08/13 2115

## 2013-04-08 NOTE — ED Notes (Signed)
Patient transported to X-ray 

## 2013-04-08 NOTE — ED Notes (Signed)
Pt unable to ambulate at this time.  Upon sitting up in the bed she stated she was extremely nauseous and dizzy and fell back on the bed.

## 2013-04-08 NOTE — ED Provider Notes (Signed)
Medical screening examination/treatment/procedure(s) were conducted as a shared visit with non-physician practitioner(s) and myself.  I personally evaluated the patient during the encounter  Pt seen and with wheezes from copd, will be admitted by medicine  Toy Baker, MD 04/08/13 2128

## 2013-04-08 NOTE — ED Notes (Signed)
Attempted to call [facility] with no answer.  Called [facility] and stated they do not have the E numbers

## 2013-04-09 ENCOUNTER — Ambulatory Visit: Payer: Medicare Other | Admitting: Rehabilitative and Restorative Service Providers"

## 2013-04-09 ENCOUNTER — Inpatient Hospital Stay (HOSPITAL_COMMUNITY): Payer: Medicare Other

## 2013-04-09 DIAGNOSIS — J441 Chronic obstructive pulmonary disease with (acute) exacerbation: Principal | ICD-10-CM

## 2013-04-09 DIAGNOSIS — F411 Generalized anxiety disorder: Secondary | ICD-10-CM

## 2013-04-09 DIAGNOSIS — E119 Type 2 diabetes mellitus without complications: Secondary | ICD-10-CM

## 2013-04-09 LAB — CBC
HCT: 34.2 % — ABNORMAL LOW (ref 36.0–46.0)
Hemoglobin: 11 g/dL — ABNORMAL LOW (ref 12.0–15.0)
RBC: 3.36 MIL/uL — ABNORMAL LOW (ref 3.87–5.11)
RDW: 14.4 % (ref 11.5–15.5)
WBC: 8.8 10*3/uL (ref 4.0–10.5)

## 2013-04-09 LAB — GLUCOSE, CAPILLARY
Glucose-Capillary: 213 mg/dL — ABNORMAL HIGH (ref 70–99)
Glucose-Capillary: 253 mg/dL — ABNORMAL HIGH (ref 70–99)

## 2013-04-09 LAB — HEMOGLOBIN A1C
Hgb A1c MFr Bld: 6.1 % — ABNORMAL HIGH (ref <5.7)
Mean Plasma Glucose: 128 mg/dL — ABNORMAL HIGH (ref <117)

## 2013-04-09 LAB — COMPREHENSIVE METABOLIC PANEL
Alkaline Phosphatase: 41 U/L (ref 39–117)
BUN: 13 mg/dL (ref 6–23)
Calcium: 9.4 mg/dL (ref 8.4–10.5)
Creatinine, Ser: 0.86 mg/dL (ref 0.50–1.10)
GFR calc Af Amer: 70 mL/min — ABNORMAL LOW (ref >90)
Glucose, Bld: 290 mg/dL — ABNORMAL HIGH (ref 70–99)
Total Protein: 6.8 g/dL (ref 6.0–8.3)

## 2013-04-09 LAB — TROPONIN I: Troponin I: <0.3 ng/mL (ref <0.30)

## 2013-04-09 LAB — TSH: TSH: 1.797 u[IU]/mL (ref 0.350–4.500)

## 2013-04-09 MED ORDER — METHYLPREDNISOLONE SODIUM SUCC 40 MG IJ SOLR
40.0000 mg | Freq: Three times a day (TID) | INTRAMUSCULAR | Status: DC
  Administered 2013-04-09 (×2): 40 mg via INTRAVENOUS
  Filled 2013-04-09 (×6): qty 1

## 2013-04-09 MED ORDER — TECHNETIUM TO 99M ALBUMIN AGGREGATED
5.7000 | Freq: Once | INTRAVENOUS | Status: AC | PRN
  Administered 2013-04-09: 5.7 via INTRAVENOUS

## 2013-04-09 MED ORDER — TECHNETIUM TC 99M DIETHYLENETRIAME-PENTAACETIC ACID: 44.5000 | Freq: Once | INTRAVENOUS | Status: AC | PRN

## 2013-04-09 MED ORDER — IPRATROPIUM BROMIDE 0.02 % IN SOLN
0.5000 mg | Freq: Four times a day (QID) | RESPIRATORY_TRACT | Status: DC
  Administered 2013-04-09 – 2013-04-11 (×7): 0.5 mg via RESPIRATORY_TRACT
  Filled 2013-04-09 (×8): qty 2.5

## 2013-04-09 NOTE — Progress Notes (Signed)
[facility] PROGRESS NOTE  ABIMBOLA AKI WJX:[id number] DOB: 1926/12/13 DOA: 04/08/2013 PCP: Rene Paci, MD  Assessment/Plan:  Acute COPD exacerbation  Continue nebulizer treatments every 6 hours  Solu-Medrol 40 mg IV every 8  Continue with Ceftriaxone and Azithromycin day 2. D dimer mildly elevated, V-Q scan low probability.   Diabetes mellitus type 2  Hold metformin while in the hospital.  SSI  Facial spasms  Patient takes Xanax for this   Hypertension  Continue losartan/HCTZ Discontinue lasix.    Code Status: Full Family Communication: Care discussed with patient and husband.  Disposition Plan: to be determine. PT consulted.    Consultants:  none  Procedures:  none  Antibiotics:  Ceftriaxone 5-29  Azithromycin 5-29   HPI/Subjective: Feeling better.  2 days prior to admission she was not able to walk.  She just came from the test, it wasn't that bad she said.  She denies abdominal pain, doesn't recall when was last time she had a BM.   Objective: Filed Vitals:   04/08/13 2258 04/08/13 2315 04/09/13 0600 04/09/13 0840  BP: 112/74 131/70 144/78   Pulse: 121 117 102   Temp:  98.8 F (37.1 C) 98.9 F (37.2 C)   TempSrc:  Oral Oral   Resp: 16 22 20    Weight:  81.194 kg (179 lb)    SpO2: 94% 96% 97% 96%    Intake/Output Summary (Last 24 hours) at 04/09/13 1138 Last data filed at 04/09/13 0917  Gross per 24 hour  Intake 1195.83 ml  Output      0 ml  Net 1195.83 ml   Filed Weights   04/08/13 2315  Weight: 81.194 kg (179 lb)    Exam:   General: no distress.   Cardiovascular: S 1, S 2 RRR  Respiratory: few ronchus  Abdomen: BS present, soft, NT  Musculoskeletal: no edema.  Data Reviewed: Basic Metabolic Panel:  Recent Labs Lab 04/08/13 1729 04/09/13 0345  NA 139 139  K 3.7 3.7  CL 101 100  CO2 26 21  GLUCOSE 174* 290*  BUN 13 13  CREATININE 0.78 0.86  CALCIUM 9.9 9.4   Liver Function Tests:  Recent Labs Lab  04/09/13 0345  AST 21  ALT 20  ALKPHOS 41  BILITOT 0.4  PROT 6.8  ALBUMIN 3.6   No results found for this basename: LIPASE, AMYLASE,  in the last 168 hours No results found for this basename: AMMONIA,  in the last 168 hours CBC:  Recent Labs Lab 04/08/13 1729 04/09/13 0345  WBC 14.1* 8.8  HGB 11.3* 11.0*  HCT 35.1* 34.2*  MCV 100.9* 101.8*  PLT 264 224   Cardiac Enzymes:  Recent Labs Lab 04/08/13 2235 04/09/13 0345 04/09/13 1032  TROPONINI <0.30 <0.30 <0.30   BNP (last 3 results)  Recent Labs  01/02/13 1628  PROBNP 725.1*   CBG:  Recent Labs Lab 04/09/13 0749  GLUCAP 255*    No results found for this or any previous visit (from the past 240 hour(s)).   Studies: Dg Chest 2 View  04/08/2013   *RADIOLOGY REPORT*  Clinical Data: Cough, wheezing and shortness of breath.  CHEST - 2 VIEW  Comparison: Chest x-ray 03/04/2013.  Findings: Lungs appear hyperexpanded and there is diffuse peribronchial cuffing.  No acute consolidative airspace disease. Blunting of the left costophrenic sulcus is unchanged and appears to represent some chronic pleuroparenchymal scarring.  Mild crowding of the pulmonary vasculature, without frank pulmonary edema.  Heart size is mildly enlarged.  Upper mediastinal contours are similar to prior study is generally unremarkable. Atherosclerosis in the thoracic aorta.  IMPRESSION: 1.  Mild diffuse bronchial wall thickening and mild hyperinflation of the lungs may reflect a acute or chronic bronchitis. 2.  Mild cardiomegaly. 3.  Atherosclerosis. 4.  Probable chronic left pleuroparenchymal scarring redemonstrated.   Original Report Authenticated By: Trudie Reed, M.D.   Nm Pulmonary Perf And Vent  04/09/2013   *RADIOLOGY REPORT*  Clinical Data: Shortness of breath  NM PULMONARY VENTILATION AND PERFUSION SCAN  Views:  Anterior, posterior, left lateral, right lateral, RPO, LPO, RAO, LAO - ventilation and perfusion  Radiopharmaceutical: Technetium 28m  DTPA - ventilation; technetium 46m macroaggregated albumin - perfusion  Dose:  44.5 mCi - ventilation; 5.7 mCi - perfusion  Route of administration:  Inhalation - ventilation; intravenous - perfusion  Comparison: Chest radiograph Apr 08, 2013  Findings: There are patchy subsegmental ventilation defects bilaterally.  There is no segmental distribution ventilation defect.  On the perfusion scan, there are a few minimal subsegmental defects, less notable than on the ventilation study.  There is no evidence of a segmental perfusion defect.  There are no perfusion defects which are larger than ventilation defects.  IMPRESSION: No significant ventilation / perfusion mismatch.  There are a few minimal perfusion defects which are nonsegmental.  This study constitutes an overall low probability for pulmonary embolus.   Original Report Authenticated By: Bretta Bang, M.D.    Scheduled Meds: . sodium chloride   Intravenous STAT  . albuterol  2.5 mg Nebulization Q6H  . ALPRAZolam  1 mg Oral TID  . aspirin EC  81 mg Oral q morning - 10a  . azithromycin  500 mg Intravenous Q24H  . baclofen  5 mg Oral QHS  . cefTRIAXone (ROCEPHIN)  IV  1 g Intravenous Q24H  . enoxaparin (LOVENOX) injection  40 mg Subcutaneous Q24H  . furosemide  20 mg Oral Daily  . guaiFENesin  600 mg Oral Daily  . hydrochlorothiazide  25 mg Oral Daily  . insulin aspart  0-20 Units Subcutaneous TID WC  . ipratropium  0.5 mg Nebulization Q6H  . levothyroxine  175 mcg Oral QAC breakfast  . loratadine  10 mg Oral Daily  . methylPREDNISolone (SOLU-MEDROL) injection  60 mg Intravenous Q6H  . pantoprazole  40 mg Oral Daily  . potassium chloride SA  20 mEq Oral Daily  . sertraline  50 mg Oral Daily  . sodium chloride  3 mL Intravenous Q12H   Continuous Infusions: . sodium chloride 50 mL/hr at 04/09/13 4098    Principal Problem:   COPD exacerbation Active Problems:   Diabetes mellitus type II, controlled   Hypothyroidism    Depression   Anxiety   Chronic asthmatic bronchitis with acute exacerbation    Time spent: 35 minutes.     REGALADO,BELKYS  [facility] Pager [phone]. If 7PM-7AM, please contact night-coverage at [email or web address], password [facility] 04/09/2013, 11:38 AM  LOS: 1 day

## 2013-04-09 NOTE — Evaluation (Signed)
Physical Therapy Evaluation Patient Details Name: Paula [name] MRN: [id number] DOB: 08/25/1927 Today's Date: 04/09/2013 Time: 8295-6213 PT Time Calculation (min): 30 min  PT Assessment / Plan / Recommendation Clinical Impression  Pt is an 80 year old female admitted for COPD exacerbation.  Pt states she goes to [facility] outpatient PT (for balance per pt) and hopes to continue upon d/c.  Spouse states pt is near blind due to macular degeneration.  Pt would benefit from acute PT services in order to improve independence with transfers and ambulation safely to prepare for d/c home with spouse.  Pt currently min assist however she feels she will be able to d/c home with spouse assist ("He's been helping me for years.")  If pt feels unable to transport to outpatient, recommend HHPT initially.    PT Assessment  Patient needs continued PT services    Follow Up Recommendations  Outpatient PT    Does the patient have the potential to tolerate intense rehabilitation      Barriers to Discharge        Equipment Recommendations  None recommended by PT    Recommendations for Other Services     Frequency Min 3X/week    Precautions / Restrictions Precautions Precautions: Fall Precaution Comments: monitor sats   Pertinent Vitals/Pain SaO2 at rest on room air 93% SaO2 during ambulation on room air 88-93%.      Mobility  Bed Mobility Bed Mobility: Supine to Sit Supine to Sit: 4: Min assist Details for Bed Mobility Assistance: assist for trunk upright Transfers Transfers: Sit to Stand;Stand to Sit Sit to Stand: 4: Min assist;With upper extremity assist;From bed;From chair/3-in-1 Stand to Sit: 4: Min assist;With upper extremity assist;To chair/3-in-1;To bed Details for Transfer Assistance: pt assisted with donning undergarment prior to ambulation, assist to rise and steady due to posterior lean upon standing as well as controlling descent, verbal cues for safe  technique Ambulation/Gait Ambulation/Gait Assistance: 4: Min guard Ambulation Distance (Feet): 160 Feet (x2) Assistive device: Rolling walker Ambulation/Gait Assistance Details: pt ambulated without oxygen and SaO2 88-93% during ambulation, short standing rest break taken after 160 feet then ambulated back to room, verbal cues for safety with turning (foot outside RW) and safe RW distance Gait Pattern: Step-through pattern;Trunk flexed;Wide base of support Gait velocity: decreased    Exercises     PT Diagnosis: Difficulty walking  PT Problem List: Decreased strength;Decreased activity tolerance;Decreased mobility;Decreased balance;Decreased safety awareness;Decreased knowledge of use of DME;Cardiopulmonary status limiting activity PT Treatment Interventions: DME instruction;Gait training;Functional mobility training;Therapeutic activities;Therapeutic exercise;Patient/family education;Neuromuscular re-education;Balance training   PT Goals Acute Rehab PT Goals PT Goal Formulation: With patient Time For Goal Achievement: 04/16/13 Potential to Achieve Goals: Good Pt will go Supine/Side to Sit: with supervision PT Goal: Supine/Side to Sit - Progress: Goal set today Pt will go Sit to Stand: with supervision PT Goal: Sit to Stand - Progress: Goal set today Pt will go Stand to Sit: with supervision PT Goal: Stand to Sit - Progress: Goal set today Pt will Ambulate: 51 - 150 feet;with supervision;with least restrictive assistive device PT Goal: Ambulate - Progress: Goal set today Pt will Perform Home Exercise Program: with supervision, verbal cues required/provided PT Goal: Perform Home Exercise Program - Progress: Goal set today  Visit Information  Last PT Received On: 04/09/13 Assistance Needed: +1    Subjective Data  Subjective: I go to [facility] for therapy.   Patient Stated Goal: return to PLOF   Prior Functioning  Home Living Lives  With: Spouse Type of Home:  House Home Access: Stairs to enter [employer] of Steps: 1 step up from [employer]: None Home Layout: One level Home Adaptive Equipment: Walker - rolling Prior Function Level of Independence: Independent with assistive device(s) Communication Communication: No difficulties    Cognition  Cognition Arousal/Alertness: Awake/alert Behavior During Therapy: WFL for tasks assessed/performed Overall Cognitive Status: Within Functional Limits for tasks assessed    Extremity/Trunk Assessment Right Lower Extremity Assessment RLE ROM/Strength/Tone: Deficits RLE ROM/Strength/Tone Deficits: functional weakness observed Left Lower Extremity Assessment LLE ROM/Strength/Tone: Deficits LLE ROM/Strength/Tone Deficits: functional weakness observed   Balance Balance Balance Assessed: Yes Static Standing Balance Static Standing - Balance Support: No upper extremity supported;During functional activity Static Standing - Level of Assistance: 4: Min assist Static Standing - Comment/# of Minutes: min/guard while pt applied lipstick prior to ambulation (slight swaying observed however no physical assist required)  End of Session PT - End of Session Activity Tolerance: Patient tolerated treatment well Patient left: in chair;with call bell/phone within reach;with nursing in room Nurse Communication: Mobility status  GP     Lonnel Gjerde,KATHrine E 04/09/2013, 4:11 PM Zenovia Jarred, PT, DPT 04/09/2013 Pager: [phone]

## 2013-04-10 DIAGNOSIS — J45901 Unspecified asthma with (acute) exacerbation: Secondary | ICD-10-CM

## 2013-04-10 LAB — BASIC METABOLIC PANEL
CO2: 25 mEq/L (ref 19–32)
Chloride: 100 mEq/L (ref 96–112)
Creatinine, Ser: 0.89 mg/dL (ref 0.50–1.10)
GFR calc Af Amer: 67 mL/min — ABNORMAL LOW (ref >90)
Potassium: 4.1 mEq/L (ref 3.5–5.1)

## 2013-04-10 LAB — CBC
MCV: 102.9 fL — ABNORMAL HIGH (ref 78.0–100.0)
Platelets: 285 10*3/uL (ref 150–400)
RBC: 3.47 MIL/uL — ABNORMAL LOW (ref 3.87–5.11)
RDW: 14.6 % (ref 11.5–15.5)
WBC: 14.1 10*3/uL — ABNORMAL HIGH (ref 4.0–10.5)

## 2013-04-10 LAB — URINALYSIS, ROUTINE W REFLEX MICROSCOPIC
Bilirubin Urine: NEGATIVE
Glucose, UA: NEGATIVE mg/dL
Hgb urine dipstick: NEGATIVE
Ketones, ur: NEGATIVE mg/dL
Protein, ur: NEGATIVE mg/dL
pH: 5.5 (ref 5.0–8.0)

## 2013-04-10 LAB — GLUCOSE, CAPILLARY: Glucose-Capillary: 225 mg/dL — ABNORMAL HIGH (ref 70–99)

## 2013-04-10 MED ORDER — PREDNISONE 50 MG PO TABS
50.0000 mg | ORAL_TABLET | Freq: Every day | ORAL | Status: DC
  Administered 2013-04-11: 50 mg via ORAL
  Filled 2013-04-10 (×2): qty 1

## 2013-04-10 MED ORDER — AZITHROMYCIN 500 MG PO TABS
500.0000 mg | ORAL_TABLET | Freq: Every day | ORAL | Status: DC
  Administered 2013-04-10: 500 mg via ORAL
  Filled 2013-04-10 (×2): qty 1

## 2013-04-10 MED ORDER — POLYETHYLENE GLYCOL 3350 17 G PO PACK
17.0000 g | PACK | Freq: Every day | ORAL | Status: DC
  Administered 2013-04-10 – 2013-04-11 (×2): 17 g via ORAL
  Filled 2013-04-10 (×2): qty 1

## 2013-04-10 NOTE — Progress Notes (Signed)
Patient somewhat calmer, still being verbally abusive to husband and cursed at son and daughter in law.  Patient still refusing to take medications or insulin at this time.  Continues to be confused.  Will monitor.

## 2013-04-10 NOTE — Progress Notes (Signed)
Patient agitated and confused this morning.  When nurse arrived in room patient getting ready to walk out in the halls and began demanding to leave the floor and that she was going home.  I informed patient that she needs to stay on the unit until she speaks to her doctor about leaving.  Patient says she has been waiting for hours for the doctor which I told her the doctor did not come on shift until 7 a.m. And must see people who are critically ill before making her rounds.  Patient very agitated, argumentative, refusing care.  States if I do not take her IV off she is going to cut it off.  Verbally abusive to husband and staff.  MD happened to walk through halls and patient immediately began saying she was going home.  Gave patient Xanax at MD request.  Will continue to monitor patient.

## 2013-04-10 NOTE — Progress Notes (Signed)
[facility] PROGRESS NOTE  Paula [name] ZOX:[id number] DOB: [birth year] DOA: 04/08/2013 PCP: Rene Paci, MD  Assessment/Plan:  Acute COPD exacerbation  Improved.  Continue nebulizer treatments every 6 hours  Change solumedrol to prednisone.  Continue with Ceftriaxone and Azithromycin day 3. D dimer mildly elevated, V-Q scan low probability.   Encephalopathy/Delirium:  Patient very confuse and agitated. Wants to go home. Patient probably develop delirium from infection or hospital delirium. Also solumedrol might play a role in agitation. Solumedrol change to prednisone.  Patient received her home dose of xanax.  Neuro exam non focal.  Will repeat UA.   Diabetes mellitus type 2  Hold metformin while in the hospital.  SSI  Facial spasms  Patient takes Xanax for this   Hypertension  Continue losartan/HCTZ Discontinue lasix.    Code Status: Full Family Communication: Care discussed with  husband.  Disposition Plan: to be determine. PT consulted.    Consultants:  none  Procedures:  none  Antibiotics:  Ceftriaxone 5-29  Azithromycin 5-29   HPI/Subjective: Patient confuse and agitated this morning.  Patient breathing better.  Patient wants to go home, she is agitated.   Objective: Filed Vitals:   04/09/13 1326 04/09/13 1948 04/10/13 0201 04/10/13 0632  BP: 145/79   154/86  Pulse: 94   78  Temp: 99.3 F (37.4 C)   98.5 F (36.9 C)  TempSrc: Oral   Oral  Resp: 18   18  Weight:      SpO2: 96% 91% 97%     Intake/Output Summary (Last 24 hours) at 04/10/13 1224 Last data filed at 04/10/13 0827  Gross per 24 hour  Intake   1830 ml  Output      0 ml  Net   1830 ml   Filed Weights   04/08/13 2315  Weight: 81.194 kg (179 lb)    Exam:   General: no distress.   Cardiovascular: S 1, S 2 RRR  Respiratory: few ronchus  Abdomen: BS present, soft, NT  Musculoskeletal: no edema.  Data Reviewed: Basic Metabolic Panel:  Recent Labs Lab  04/08/13 1729 04/09/13 0345 04/10/13 0540  NA 139 139 137  K 3.7 3.7 4.1  CL 101 100 100  CO2 26 21 25   GLUCOSE 174* 290* 309*  BUN 13 13 24*  CREATININE 0.78 0.86 0.89  CALCIUM 9.9 9.4 9.6   Liver Function Tests:  Recent Labs Lab 04/09/13 0345  AST 21  ALT 20  ALKPHOS 41  BILITOT 0.4  PROT 6.8  ALBUMIN 3.6   No results found for this basename: LIPASE, AMYLASE,  in the last 168 hours No results found for this basename: AMMONIA,  in the last 168 hours CBC:  Recent Labs Lab 04/08/13 1729 04/09/13 0345 04/10/13 0540  WBC 14.1* 8.8 14.1*  HGB 11.3* 11.0* 11.2*  HCT 35.1* 34.2* 35.7*  MCV 100.9* 101.8* 102.9*  PLT 264 224 285   Cardiac Enzymes:  Recent Labs Lab 04/08/13 2235 04/09/13 0345 04/09/13 1032  TROPONINI <0.30 <0.30 <0.30   BNP (last 3 results)  Recent Labs  01/02/13 1628  PROBNP 725.1*   CBG:  Recent Labs Lab 04/09/13 1132 04/09/13 1645 04/09/13 2137 04/10/13 0748 04/10/13 1104  GLUCAP 271* 213* 253* 225* 163*    No results found for this or any previous visit (from the past 240 hour(s)).   Studies: Dg Chest 2 View  04/08/2013   *RADIOLOGY REPORT*  Clinical Data: Cough, wheezing and shortness of breath.  CHEST - 2  VIEW  Comparison: Chest x-ray 03/04/2013.  Findings: Lungs appear hyperexpanded and there is diffuse peribronchial cuffing.  No acute consolidative airspace disease. Blunting of the left costophrenic sulcus is unchanged and appears to represent some chronic pleuroparenchymal scarring.  Mild crowding of the pulmonary vasculature, without frank pulmonary edema.  Heart size is mildly enlarged.  Upper mediastinal contours are similar to prior study is generally unremarkable. Atherosclerosis in the thoracic aorta.  IMPRESSION: 1.  Mild diffuse bronchial wall thickening and mild hyperinflation of the lungs may reflect a acute or chronic bronchitis. 2.  Mild cardiomegaly. 3.  Atherosclerosis. 4.  Probable chronic left pleuroparenchymal  scarring redemonstrated.   Original Report Authenticated By: Trudie Reed, M.D.   Nm Pulmonary Perf And Vent  04/09/2013   *RADIOLOGY REPORT*  Clinical Data: Shortness of breath  NM PULMONARY VENTILATION AND PERFUSION SCAN  Views:  Anterior, posterior, left lateral, right lateral, RPO, LPO, RAO, LAO - ventilation and perfusion  Radiopharmaceutical: Technetium 38m DTPA - ventilation; technetium 28m macroaggregated albumin - perfusion  Dose:  44.5 mCi - ventilation; 5.7 mCi - perfusion  Route of administration:  Inhalation - ventilation; intravenous - perfusion  Comparison: Chest radiograph Apr 08, 2013  Findings: There are patchy subsegmental ventilation defects bilaterally.  There is no segmental distribution ventilation defect.  On the perfusion scan, there are a few minimal subsegmental defects, less notable than on the ventilation study.  There is no evidence of a segmental perfusion defect.  There are no perfusion defects which are larger than ventilation defects.  IMPRESSION: No significant ventilation / perfusion mismatch.  There are a few minimal perfusion defects which are nonsegmental.  This study constitutes an overall low probability for pulmonary embolus.   Original Report Authenticated By: Bretta Bang, M.D.    Scheduled Meds: . albuterol  2.5 mg Nebulization Q6H  . ALPRAZolam  1 mg Oral TID  . aspirin EC  81 mg Oral q morning - 10a  . azithromycin  500 mg Oral QHS  . baclofen  5 mg Oral QHS  . cefTRIAXone (ROCEPHIN)  IV  1 g Intravenous Q24H  . enoxaparin (LOVENOX) injection  40 mg Subcutaneous Q24H  . guaiFENesin  600 mg Oral Daily  . hydrochlorothiazide  25 mg Oral Daily  . insulin aspart  0-20 Units Subcutaneous TID WC  . ipratropium  0.5 mg Nebulization Q6H  . levothyroxine  175 mcg Oral QAC breakfast  . loratadine  10 mg Oral Daily  . pantoprazole  40 mg Oral Daily  . potassium chloride SA  20 mEq Oral Daily  . [START ON 04/11/2013] predniSONE  50 mg Oral Q breakfast  .  sertraline  50 mg Oral Daily  . sodium chloride  3 mL Intravenous Q12H   Continuous Infusions: . sodium chloride 50 mL/hr at 04/09/13 1844    Principal Problem:   COPD exacerbation Active Problems:   Diabetes mellitus type II, controlled   Hypothyroidism   Depression   Anxiety   Chronic asthmatic bronchitis with acute exacerbation    Time spent: 35 minutes.     Quintasha Gren  [facility] Pager [phone]. If 7PM-7AM, please contact night-coverage at [email or web address], password [facility] 04/10/2013, 12:24 PM  LOS: 2 days

## 2013-04-10 NOTE — Progress Notes (Signed)
PHARMACIST - PHYSICIAN COMMUNICATION [facility] CONCERNING: Antibiotic IV to Oral Route Change Policy  RECOMMENDATION: This patient is receiving Zithromax by the intravenous route.  Based on criteria approved by the Pharmacy and Therapeutics Committee, the antibiotic(s) is/are being converted to the equivalent oral dose form(s).   DESCRIPTION: These criteria include:  Patient being treated for a respiratory tract infection, urinary tract infection, or cellulitis  The patient is not neutropenic and does not exhibit a GI malabsorption state  The patient is eating (either orally or via tube) and/or has been taking other orally administered medications for a least 24 hours  The patient is improving clinically and has a Tmax < 100.5  If you have questions about this conversion, please contact the Pharmacy Department  []   [phone] )  [facility] []   [phone] )  [facility]  []   [phone] )  [facility] [x]   [phone] )  [facility]    Hessie Knows, PharmD, [place] Pager [phone] 04/10/2013 7:53 AM

## 2013-04-10 NOTE — Progress Notes (Signed)
OT Cancellation Note  Patient Details Name: Paula [name] MRN: [id number] DOB: February 06, 1927   Cancelled Treatment:    Reason Eval/Treat Not Completed: Other (comment)--spoke with PT earlier this AM and she advised to hold on this patient due to mental status changes. Will eval at a later date.  Evette Georges [phone] 04/10/2013, 3:03 PM

## 2013-04-10 NOTE — Progress Notes (Signed)
Physical Therapy Treatment Patient Details Name: Paula [name] MRN: [id number] DOB: Apr 17, 1927 Today's Date: 04/10/2013 Time: 9562-1308 PT Time Calculation (min): 26 min  PT Assessment / Plan / Recommendation Comments on Treatment Session  Pt has had a change in MS and is very agitated, requires redirection for safety. Pt would not be deemed safe to return home with spouse.    Follow Up Recommendations  SNF (unless MS clears.)     Does the patient have the potential to tolerate intense rehabilitation     Barriers to Discharge        Equipment Recommendations  None recommended by PT    Recommendations for Other Services    Frequency Min 3X/week   Plan Discharge plan needs to be updated;Frequency remains appropriate    Precautions / Restrictions Precautions Precautions: Fall Precaution Comments: pt has become agitated Restrictions Weight Bearing Restrictions: No   Pertinent Vitals/Pain     Mobility  Transfers Sit to Stand: 4: Min guard;From bed;From toilet Stand to Sit: To chair/3-in-1;To toilet;With upper extremity assist;4: Min guard Details for Transfer Assistance: cues  to redirect during transfers to toilet as pt is focussed on leaving the hospital. and the " mess" in her room. Ambulation/Gait Ambulation/Gait Assistance: 4: Min guard Ambulation Distance (Feet): 80 Feet Assistive device: Rolling walker Ambulation/Gait Assistance Details: pt ambulated without O2 but was not noted to be dysonea, pt really too agitated to comply with monitoring of O sats Gait Pattern: Step-through pattern Gait velocity: decreased    Exercises     PT Diagnosis:    PT Problem List:   PT Treatment Interventions:     PT Goals Acute Rehab PT Goals Pt will go Sit to Stand: with supervision PT Goal: Sit to Stand - Progress: Progressing toward goal Pt will go Stand to Sit: with supervision PT Goal: Stand to Sit - Progress: Progressing toward goal Pt will Ambulate: 51 - 150 feet;with  supervision;with least restrictive assistive device PT Goal: Ambulate - Progress: Progressing toward goal  Visit Information  Last PT Received On: 04/10/13 Assistance Needed: +1    Subjective Data  Subjective: I am ready to leave. It is my legal right.   Cognition  Cognition Arousal/Alertness: Awake/alert Behavior During Therapy: Agitated Area of Impairment: Attention;Following commands;Safety/judgement General Comments: frequent redirection, pt is beligerant at times.    Balance     End of Session PT - End of Session Activity Tolerance: Treatment limited secondary to agitation Patient left: in chair;with call bell/phone within reach;with family/visitor present Nurse Communication: Mobility status   GP     Rada Hay 04/10/2013, 11:34 AM

## 2013-04-10 NOTE — Progress Notes (Signed)
  CCM contacted RN reporting pt moved into 2nd degree HB w/ lengthening PR interval.  EKG performed. MD notified.

## 2013-04-11 LAB — BASIC METABOLIC PANEL
BUN: 24 mg/dL — ABNORMAL HIGH (ref 6–23)
CO2: 29 mEq/L (ref 19–32)
Calcium: 9.2 mg/dL (ref 8.4–10.5)
Creatinine, Ser: 0.88 mg/dL (ref 0.50–1.10)
Glucose, Bld: 144 mg/dL — ABNORMAL HIGH (ref 70–99)

## 2013-04-11 LAB — GLUCOSE, CAPILLARY: Glucose-Capillary: 154 mg/dL — ABNORMAL HIGH (ref 70–99)

## 2013-04-11 LAB — CBC
HCT: 33.6 % — ABNORMAL LOW (ref 36.0–46.0)
Hemoglobin: 10.8 g/dL — ABNORMAL LOW (ref 12.0–15.0)
MCH: 33.1 pg (ref 26.0–34.0)
MCHC: 32.1 g/dL (ref 30.0–36.0)
RBC: 3.26 MIL/uL — ABNORMAL LOW (ref 3.87–5.11)

## 2013-04-11 MED ORDER — POTASSIUM CHLORIDE CRYS ER 20 MEQ PO TBCR
40.0000 meq | EXTENDED_RELEASE_TABLET | Freq: Once | ORAL | Status: AC
  Administered 2013-04-11: 40 meq via ORAL
  Filled 2013-04-11: qty 2

## 2013-04-11 MED ORDER — PREDNISONE 20 MG PO TABS: ORAL_TABLET | ORAL | Status: DC

## 2013-04-11 MED ORDER — CEPHALEXIN 500 MG PO CAPS: 500.0000 mg | ORAL_CAPSULE | Freq: Four times a day (QID) | ORAL | Status: DC

## 2013-04-11 NOTE — Care Management Note (Addendum)
    Page 1 of 2   04/11/2013     4:05:28 PM   CARE MANAGEMENT NOTE 04/11/2013  Patient:  Paula [name], Paula [name]   Account Number:  [id number]  Date Initiated:  04/11/2013  Documentation initiated by:  Lanier Clam  Subjective/Objective Assessment:   ADMITTEDW/COPD.     Action/Plan:   FROM HOME W/SPOUSE.   Anticipated DC Date:  04/11/2013   Anticipated DC Plan:  HOME/SELF CARE      DC Planning Services  CM consult  Outpatient Services - Pt will follow up      Choice offered to / List presented to:  C-1 Patient           Status of service:  Completed, signed off Medicare Important Message given?   (If response is "NO", the following Medicare IM given date fields will be blank) Date Medicare IM given:   Date Additional Medicare IM given:    Discharge Disposition:  HOME/SELF CARE  Per UR Regulation:    If discussed at Long Length of Stay Meetings, dates discussed:    Comments:  04/11/13 Connelly Spruell RN,BSN NCM WEEKEND [phone] 4P-PER MD D/C SUMMARY,RECOMMENDING OTPT REHAB NOTED IN F/U SECTION OF D/C INSTRUCTIONS,& PER MD D/CSUMMARY.TC [facility]A-MADE HER AWARE OF OTPT REHAB PER MD,THEREFORE DOES NOT QUALIFY FOR HH.PATIENT HAS ALREADY LEFT BLDG.NURSE-TIM AWARE. REFERRAL & HH ORDERS FOR HHRN,NURSE'S AIDE.SPOKE TO PATIENT/SPOUSE ABOUT HHC.PATIENT HAD CONCERNS ABOUT WHY SHE NEEDED A NURSE'S AIDE-EXPLAINED THAT THIS WAS A RECOMMENDATION FROM THE MD. I EXPLAINED WHAT SERVICES HHRN,& NURSE'S AIDE PROVIDED. PATIENT STARTED TALKING ABOUT PREVIOUS SERVICES FROM HHC,& HOW THEY MOVED THINGS AROUND IN HER BATHROOM.I RECOMMENDED THAT SHE INFORM THE AGENCY IF SHE FELT THEY DID SOMETHING THAT SHE DID NOT APPROVE OF.SHE VOICED UNDERSTANDING.SHE ASKED ABOUT HER OTHER INSURANCE POLICIES& WHAT THEY COVERED.ADVISED THAT SHE CONTACT CUSTOMER SERVICE ON THE CARD TO ADDRESS HER SPECIFIC REQUEST FOR SERVICES COVERED.SHE VOICED UNDERSTANDING.PATIENT/SPOUSE CHOSE [facility] BUT WANTED THE MD TO EXPLAIN WHY NURSE'S AIDE WAS  RECOMMENDED.NURSE AWARE OF PATIENT'S REQUEST TO SEE MD.PT-RECOMMENDED SNF,PER NSG PT WILL RE-EVAL AGAIN.AWAIT FINAL HH ORDERS.TC [facility] SPOKE TO CHRISTINE,AWARE OF REFERRAL,& POSSIBLE D/C TODAY.HHRN/AIDE ORDERS ALREADY PUT IN. NURSE TIM WILL PAGE MD FOR CLARIFICATION OF HH ORDERS.

## 2013-04-11 NOTE — Progress Notes (Signed)
She is in no distress.  Prescriptions and instructions given to her husband, for which he signs. Pt. Is mildly confused, however her husband is oriented and verbalizes understanding of instructions, including meds and follow-up.

## 2013-04-11 NOTE — Progress Notes (Signed)
Physical Therapy Treatment Patient Details Name: SMT. LODER MRN: [id number] DOB: September 25, 1927 Today's Date: 04/11/2013 Time: 9562-1308 PT Time Calculation (min): 17 min  PT Assessment / Plan / Recommendation Comments on Treatment Session  Pt is back to baseline cognitive. Per spouse , he can care for pt at home. Informed RN that spouse wants pt to continue with outpt PT and not HHPT.     Follow Up Recommendations  Home health PT     Does the patient have the potential to tolerate intense rehabilitation     Barriers to Discharge        Equipment Recommendations  None recommended by PT    Recommendations for Other Services    Frequency Min 3X/week   Plan Discharge plan needs to be updated    Precautions / Restrictions Precautions Precautions: Fall   Pertinent Vitals/Pain     Mobility  Bed Mobility Bed Mobility: Supine to Sit Supine to Sit: 5: Supervision Transfers Sit to Stand: 5: Supervision Stand to Sit: 5: Supervision Ambulation/Gait Ambulation/Gait Assistance: 4: Min guard Ambulation Distance (Feet): 100 Feet Assistive device: Rolling walker Gait Pattern: Step-through pattern    Exercises     PT Diagnosis:    PT Problem List:   PT Treatment Interventions:     PT Goals Acute Rehab PT Goals Pt will go Supine/Side to Sit: with supervision PT Goal: Supine/Side to Sit - Progress: Met Pt will go Sit to Stand: with supervision PT Goal: Sit to Stand - Progress: Met Pt will go Stand to Sit: with supervision PT Goal: Stand to Sit - Progress: Met Pt will Ambulate: 51 - 150 feet;with supervision;with least restrictive assistive device PT Goal: Ambulate - Progress: Met  Visit Information  Last PT Received On: 04/11/13 Assistance Needed: +1    Subjective Data  Subjective: I am ready to go home   Cognition  Cognition Arousal/Alertness: Awake/alert Overall Cognitive Status: Within Functional Limits for tasks assessed General Comments: pt is not agitated, does  not recall yesterday. Able to participate.    Balance     End of Session PT - End of Session Activity Tolerance: Patient tolerated treatment well Patient left: in bed;with call bell/phone within reach;with family/visitor present Nurse Communication: Mobility status   GP     Rada Hay 04/11/2013, 4:28 PM

## 2013-04-11 NOTE — ED Provider Notes (Signed)
Medical screening examination/treatment/procedure(s) were performed by non-physician practitioner and as supervising physician I was immediately available for consultation/collaboration.  Mac Dowdell T Yvonne Stopher, MD 04/11/13 0954 

## 2013-04-11 NOTE — Discharge Summary (Addendum)
Physician Discharge Summary  ARANDA BIHM ZOX:[id number] DOB: Mar 03, 1927 DOA: 04/08/2013  PCP: Rene Paci, MD  Admit date: 04/08/2013 Discharge date: 04/11/2013  Time spent: 35 minutes  Recommendations for Outpatient Follow-up:  1. Follow up with PCP for further evaluation of COPD.  2. Need evaluation for dementia.  3. Need Bmet to follow up electrolytes.   Discharge Diagnoses:    Acute COPD exacerbation   Delirium   Diabetes mellitus type II, controlled   Hypothyroidism   Depression   Anxiety   Chronic asthmatic bronchitis with acute exacerbation   Discharge Condition: Stable  Diet recommendation: Heart Healthy  Filed Weights   04/08/13 2315 04/10/13 1344  Weight: 81.194 kg (179 lb) 81.647 kg (180 lb)    History of present illness:  79 y.o. female with a hx of COPD, pneumonia, DM, GERD, anxiety, HTN, hypothyroid presents to the Emergency Department complaining of gradual, persistent, progressively worsening wheezing and SOB beginning 24 hours ago. Oxygen saturation was 87% on room air, 92% on 2 L of oxygen. She was found to have significant wheezing and shortness of breath at rest. Pt c/o associated cough and fatigue. Pt has been using her home nebulizer and combivent without relief and exertion seems to make it better. Pt also endorses low grade fever below 100F at home. She was seen by the FNP at her PCP this morning who diagnosed her with a URI but did not do a CXR. Pt states she was sent home with azithromycin and took the first dose today, but is adamant that this medication does not work for her. Pt denies chills, headache, neck pain, chest pain, abdominal pain, N/V/D, weakness, dizziness, syncope, dysuria. Pt sees Dr Myrtis Ser for her cardiology follow-up. She has a Hx of CHF with a normal EF in 2012.   Hospital Course:  Acute COPD exacerbation  Patient was admitted with acute COPD exacerbation. She was started on nebulizer treatments every 6 hours , IV solumedrol,  Ceftriaxone and Azithromycin for 3 days. D dimer was mildly elevated, V-Q scan low probability. Patient respiratory status improved. She was transition to oral prednisone. Will provide prescription for Keflex for 5 more days.   Encephalopathy/Delirium:  Patient became very  confuse and agitated 5-31. Patient probably develop delirium from infection or hospital delirium.  Steroids might play a role. IV solumedrol was discontinue. Patient today is oriented to person , place, situation. Husband feels patient is back to baseline. Husband agree with discharge planning. Patient received her home dose of xanax.   Diabetes mellitus type 2  Hold metformin while in the hospital.  SSI  Facial spasms  Patient takes Xanax for this  Hypertension  Continue losartan/HCTZ  Discontinue lasix.    Procedures: V-Q scan: No significant ventilation / perfusion mismatch. There are a few  minimal perfusion defects which are nonsegmental. This study  constitutes an overall low probability for pulmonary embolus.    Consultations:  None  Discharge Exam: Filed Vitals:   04/10/13 2049 04/10/13 2144 04/11/13 0610 04/11/13 0837  BP:  148/76 152/80   Pulse:  100 75   Temp:  99.1 F (37.3 C) 98.6 F (37 C)   TempSrc:  Oral Oral   Resp:  18 18   Height:      Weight:      SpO2: 92% 90% 93% 94%    General: No distress, alert and oriented to place, person and situation.  Cardiovascular: S 1, S 2 RRR Respiratory: CTA   Discharge Instructions  Discharge  Orders   Future Appointments Provider Department Dept Phone   04/20/2013 11:15 AM Nestor Lewandowsky, PT [facility] [phone]   04/22/2013 11:15 AM Berneice Heinrich, PT [facility] [phone]   04/27/2013 11:15 AM Fraser Din, PT [facility] [phone]   04/29/2013 11:15 AM Fraser Din, PT [facility] [phone]   05/17/2013 10:00 AM [facility] [address] [phone]   Patient to arrive 15 minutes prior to appointment time. No solid food 4 hours prior to exam. Liquids and Medicines are okay.   09/20/2013 9:30 AM Julio Sicks, NP [facility] [phone]   Future Orders Complete By Expires     Diet - low sodium heart healthy  As directed     Increase activity slowly  As directed         Medication List    STOP taking these medications       azithromycin 250 MG tablet  Commonly known as:  ZITHROMAX     furosemide 20 MG tablet  Commonly known as:  LASIX     losartan-hydrochlorothiazide 50-12.5 MG per tablet  Commonly known as:  HYZAAR     meloxicam 7.5 MG tablet  Commonly known as:  MOBIC      TAKE these medications       albuterol 108 (90 BASE) MCG/ACT inhaler  Commonly known as:  PROVENTIL HFA;VENTOLIN HFA  Inhale 2 puffs into the lungs every 6 (six) hours as needed for wheezing.     ALPRAZolam 1 MG tablet  Commonly known as:  XANAX  Take 1 tablet (1 mg total) by mouth 3 (three) times daily.     aspirin EC 81 MG tablet  Take 81 mg by mouth every morning.     baclofen 10 MG tablet  Commonly known as:  LIORESAL  Take 0.5 tablets (5 mg total) by mouth at bedtime.     calcium carbonate 600 MG Tabs  Commonly known as:  OS-CAL  Take 600 mg by mouth every other day.     cephALEXin 500 MG capsule  Commonly known as:  KEFLEX  Take 1 capsule (500 mg total) by mouth 4 (four) times daily.     cetirizine 10 MG tablet  Commonly known as:  ZYRTEC  Take 1 tablet (10 mg total) by mouth daily.     guaiFENesin 600 MG 12 hr tablet  Commonly known as:  MUCINEX  Take 600 mg by mouth daily.     hydrochlorothiazide 25 MG tablet  Commonly known as:  HYDRODIURIL  Take 25 mg by mouth daily.     Ipratropium-Albuterol 20-100 MCG/ACT Aers respimat  Commonly known as:  COMBIVENT  Inhale 1 puff into  the lungs every 6 (six) hours as needed for wheezing or shortness of breath.     levothyroxine 175 MCG tablet  Commonly known as:  SYNTHROID, LEVOTHROID  Take 1 tablet (175 mcg total) by mouth daily.     metFORMIN 500 MG 24 hr tablet  Commonly known as:  GLUCOPHAGE-XR  Take 1 tablet (500 mg total) by mouth daily with breakfast.     ondansetron 4 MG tablet  Commonly known as:  ZOFRAN  Take 1 tablet (4 mg total) by mouth every 8 (eight) hours as needed for nausea.     pantoprazole 40 MG tablet  Commonly known as:  PROTONIX  Take 1 tablet (40 mg total) by mouth daily.  potassium chloride SA 20 MEQ tablet  Commonly known as:  K-DUR,KLOR-CON  Take 1 tablet (20 mEq total) by mouth daily.     predniSONE 20 MG tablet  Commonly known as:  DELTASONE  Take 2 tablet for 3 days then 1 tablet for 3 days then stop     sertraline 50 MG tablet  Commonly known as:  ZOLOFT  Take 1 tablet (50 mg total) by mouth daily.     Vitamin D3 5000 UNITS Tabs  Take 1 tablet by mouth every other day.       Allergies  Allergen Reactions  . Bactrim (Sulfamethoxazole-Tmp Ds)   . Contrast Media (Iodinated Diagnostic Agents) Anaphylaxis  . Gadolinium Derivatives   . Iohexol Anaphylaxis, Shortness Of Breath and Swelling     Desc: PT STATES SHE HAD A SEVERE REACTION TO IV CONRAST WITH THROAT SWELLING AND SOB. SHE WAS ADMITTED TO THE HOSPITAL. SHE HAS NEVER HAD CONTRAST AGAIN.   Marland Kitchen Penicillins Rash  . Sulfa Antibiotics Anaphylaxis  . Avelox (Moxifloxacin Hcl In Nacl) Rash  . Ciprofloxacin Itching  . Delsym (Dextromethorphan) Other (See Comments)    Hallucination  . Hydromet (Hydrocodone-Homatropine) Other (See Comments)    Pt states med make her hallucinate       Follow-up Information   Follow up with Rene Paci, MD In 1 week.   Contact information:   [address] [place] [address] [phone]        The results of significant diagnostics from this  hospitalization (including imaging, microbiology, ancillary and laboratory) are listed below for reference.    Significant Diagnostic Studies: Dg Chest 2 View  04/08/2013   *RADIOLOGY REPORT*  Clinical Data: Cough, wheezing and shortness of breath.  CHEST - 2 VIEW  Comparison: Chest x-ray 03/04/2013.  Findings: Lungs appear hyperexpanded and there is diffuse peribronchial cuffing.  No acute consolidative airspace disease. Blunting of the left costophrenic sulcus is unchanged and appears to represent some chronic pleuroparenchymal scarring.  Mild crowding of the pulmonary vasculature, without frank pulmonary edema.  Heart size is mildly enlarged.  Upper mediastinal contours are similar to prior study is generally unremarkable. Atherosclerosis in the thoracic aorta.  IMPRESSION: 1.  Mild diffuse bronchial wall thickening and mild hyperinflation of the lungs may reflect a acute or chronic bronchitis. 2.  Mild cardiomegaly. 3.  Atherosclerosis. 4.  Probable chronic left pleuroparenchymal scarring redemonstrated.   Original Report Authenticated By: Trudie Reed, M.D.   Nm Pulmonary Perf And Vent  04/09/2013   *RADIOLOGY REPORT*  Clinical Data: Shortness of breath  NM PULMONARY VENTILATION AND PERFUSION SCAN  Views:  Anterior, posterior, left lateral, right lateral, RPO, LPO, RAO, LAO - ventilation and perfusion  Radiopharmaceutical: Technetium 41m DTPA - ventilation; technetium 51m macroaggregated albumin - perfusion  Dose:  44.5 mCi - ventilation; 5.7 mCi - perfusion  Route of administration:  Inhalation - ventilation; intravenous - perfusion  Comparison: Chest radiograph Apr 08, 2013  Findings: There are patchy subsegmental ventilation defects bilaterally.  There is no segmental distribution ventilation defect.  On the perfusion scan, there are a few minimal subsegmental defects, less notable than on the ventilation study.  There is no evidence of a segmental perfusion defect.  There are no perfusion defects  which are larger than ventilation defects.  IMPRESSION: No significant ventilation / perfusion mismatch.  There are a few minimal perfusion defects which are nonsegmental.  This study constitutes an overall low probability for pulmonary embolus.  Original Report Authenticated By: Bretta Bang, M.D.    Microbiology: No results found for this or any previous visit (from the past 240 hour(s)).   Labs: Basic Metabolic Panel:  Recent Labs Lab 04/08/13 1729 04/09/13 0345 04/10/13 0540 04/11/13 0555  NA 139 139 137 142  K 3.7 3.7 4.1 3.3*  CL 101 100 100 106  CO2 26 21 25 29   GLUCOSE 174* 290* 309* 144*  BUN 13 13 24* 24*  CREATININE 0.78 0.86 0.89 0.88  CALCIUM 9.9 9.4 9.6 9.2   Liver Function Tests:  Recent Labs Lab 04/09/13 0345  AST 21  ALT 20  ALKPHOS 41  BILITOT 0.4  PROT 6.8  ALBUMIN 3.6   No results found for this basename: LIPASE, AMYLASE,  in the last 168 hours No results found for this basename: AMMONIA,  in the last 168 hours CBC:  Recent Labs Lab 04/08/13 1729 04/09/13 0345 04/10/13 0540 04/11/13 0555  WBC 14.1* 8.8 14.1* 5.5  HGB 11.3* 11.0* 11.2* 10.8*  HCT 35.1* 34.2* 35.7* 33.6*  MCV 100.9* 101.8* 102.9* 103.1*  PLT 264 224 285 236   Cardiac Enzymes:  Recent Labs Lab 04/08/13 2235 04/09/13 0345 04/09/13 1032  TROPONINI <0.30 <0.30 <0.30   BNP: BNP (last 3 results)  Recent Labs  01/02/13 1628  PROBNP 725.1*   CBG:  Recent Labs Lab 04/10/13 1104 04/10/13 1627 04/10/13 2140 04/11/13 0722 04/11/13 1110  GLUCAP 163* 127* 152* 154* 196*       Signed:  Azya Barbero  [facility] 04/11/2013, 12:21 PM

## 2013-04-13 ENCOUNTER — Telehealth: Payer: Self-pay | Admitting: Internal Medicine

## 2013-04-13 NOTE — Telephone Encounter (Signed)
Noted thanks °

## 2013-04-13 NOTE — Telephone Encounter (Signed)
Patient Information:  Caller Name: [name]  Phone: [phone]  Patient: Scarlette Ar  Gender: Female  DOB: 1927-02-14  Age: 79 Years  PCP: Rene Paci (Adults only)  Office Follow Up:  Does the office need to follow up with this patient?: No  Instructions For The Office: N/A  RN Note:  Appt already scheduled for 11 am on 04/14/13. Husband to monitor pt and go to ER if sx chx.  Symptoms  Reason For Call & Symptoms: Husband calling b/c wife states she feel sick. Pt hospitalized recently but (d/c on 04/11/13) for "infection". Not sure what kind. Pt states she still doesn't feel well and doesn't think she should have been d/c. Could not be more specific with sx. Husband denies that pt has any sx at the moment. Pt's thoughts seemed scattered to triager but husband denies this.  Reviewed Health History In EMR: Yes  Reviewed Medications In EMR: Yes  Reviewed Allergies In EMR: Yes  Reviewed Surgeries / Procedures: Yes  Date of Onset of Symptoms: 04/13/2013  Guideline(s) Used:  No Protocol Available - Information Only  Disposition Per Guideline:   Home Care  Reason For Disposition Reached:   Information only question and nurse able to answer  Advice Given:  N/A  Patient Will Follow Care Advice:  YES

## 2013-04-14 ENCOUNTER — Other Ambulatory Visit (INDEPENDENT_AMBULATORY_CARE_PROVIDER_SITE_OTHER): Payer: Medicare Other

## 2013-04-14 ENCOUNTER — Ambulatory Visit (INDEPENDENT_AMBULATORY_CARE_PROVIDER_SITE_OTHER): Payer: Medicare Other | Admitting: Internal Medicine

## 2013-04-14 ENCOUNTER — Ambulatory Visit: Payer: Medicare Other | Admitting: Internal Medicine

## 2013-04-14 ENCOUNTER — Encounter: Payer: Self-pay | Admitting: Internal Medicine

## 2013-04-14 VITALS — BP 126/74 | HR 86 | Temp 98.8°F | Wt 175.0 lb

## 2013-04-14 DIAGNOSIS — J439 Emphysema, unspecified: Secondary | ICD-10-CM

## 2013-04-14 DIAGNOSIS — J441 Chronic obstructive pulmonary disease with (acute) exacerbation: Secondary | ICD-10-CM

## 2013-04-14 DIAGNOSIS — I509 Heart failure, unspecified: Secondary | ICD-10-CM

## 2013-04-14 DIAGNOSIS — J438 Other emphysema: Secondary | ICD-10-CM

## 2013-04-14 DIAGNOSIS — I5032 Chronic diastolic (congestive) heart failure: Secondary | ICD-10-CM

## 2013-04-14 LAB — BASIC METABOLIC PANEL
BUN: 18 mg/dL (ref 6–23)
GFR: 61.02 mL/min (ref >60.00)
Potassium: 4.5 mEq/L (ref 3.5–5.1)
Sodium: 144 mEq/L (ref 135–145)

## 2013-04-14 NOTE — Progress Notes (Signed)
Subjective:    Patient ID: Paula [name], female    DOB: 1927/06/18, 79 y.o.   MRN: [id number]  HPI Here for hospital follow up   Past Medical History  Diagnosis Date  . Depression   . Ejection fraction     EF 40%, echo, November, 2012  / in improved, EF 60%, echo, December, 2012  . Contrast media allergy     Patient feels poorly with contrast  . Status post AAA (abdominal aortic aneurysm) repair     Surgical repair, Dr. Hart [place], December 27, 2011  . Pre-syncope     vasovagal w/ heart block  . Parotid mass     has refused further eval 10/2011  . GERD (gastroesophageal reflux disease)   . Anxiety   . Facial tic     L sided spasms - Botox trial summer 2013, ?effective  . Macular degeneration   . AAA (abdominal aortic aneurysm)     s/p repair 12/2011  . Congestive heart failure     NL EF 10/2011 echo   . Diabetes mellitus type II, controlled   . Hypothyroidism   . Sinus bradycardia 09/19/2011    Occurring simultaneously with complete heart block   . Hypertension   . COPD (chronic obstructive pulmonary disease)   . Abdominal spasms     hemifacial-treated w/ botox xeomin -Dr Terrace Arabia  . Confusion 11/02/12    hospitalized for 10 days,amnestic fo rthe stay and did not recognize her home when husband brought her home.  . Sinus disorder tested 11/03/11    syndrome  . Syncope and collapse     Review of Systems  HENT: Positive for hoarse voice. Negative for congestion and sneezing.   Respiratory: Positive for cough (chronic), shortness of breath and wheezing.   Cardiovascular: Negative for chest pain, palpitations and leg swelling.  Gastrointestinal: Positive for nausea. Negative for vomiting, abdominal pain, diarrhea and constipation.  Neurological: Positive for speech difficulty (chronic "drawing up of mouth", follows with neuro for same). Negative for headaches.  Psychiatric/Behavioral: Negative for sleep disturbance and dysphoric mood.       Objective:   Physical Exam  BP  126/74  Pulse 86  Temp(Src) 98.8 F (37.1 C) (Oral)  Wt 175 lb (79.379 kg)  BMI 30.02 kg/m2  SpO2 94% Wt Readings from Last 3 Encounters:  04/14/13 175 lb (79.379 kg)  04/10/13 180 lb (81.647 kg)  04/08/13 178 lb (80.74 kg)   Gen: NAD, nontoxic. hoarse without change. Spouse at side HENT: sinuses nontender. Tympanic membranes clear bilaterally without effusion. Oropharynx with erythema and mild exudate on the right side. Nares without discharge or turbinate swelling Lungs: CTA B - faint end exp wheeze and RML fine crackles; diminished air movement at bases CV: RRR, no edema BLE Abd: SNTND+BS, no R/G Psychiatric: tangential    Lab Results  Component Value Date   WBC 5.5 04/11/2013   HGB 10.8* 04/11/2013   HCT 33.6* 04/11/2013   PLT 236 04/11/2013   GLUCOSE 144* 04/11/2013   CHOL 149 02/10/2013   TRIG 218.0* 02/10/2013   HDL 30.10* 02/10/2013   LDLDIRECT 90.6 02/10/2013   LDLCALC 80 11/04/2011   ALT 20 04/09/2013   AST 21 04/09/2013   NA 142 04/11/2013   K 3.3* 04/11/2013   CL 106 04/11/2013   CREATININE 0.88 04/11/2013   BUN 24* 04/11/2013   CO2 29 04/11/2013   TSH 1.797 04/08/2013   INR 1.06 08/24/2012   HGBA1C 6.1* 04/08/2013  Assessment & Plan:   See problem list. Medications and labs reviewed today.  Time spent with pt/family today 30 minutes, greater than 50% time spent counseling patient on hosp for COPD exac, chronic dyspnea on exertion and medication review. Also review of prior records

## 2013-04-14 NOTE — Assessment & Plan Note (Signed)
Euvolemic today Check BMet and resume ARB/HCTZ and lasix if normal

## 2013-04-14 NOTE — Patient Instructions (Signed)
It was good to see you today. We have reviewed your prior records including labs and tests today Test(s) ordered today. Your results will be released to MyChart (or called to you) after review, usually within 72hours after test completion. If any changes need to be made, you will be notified at that same time. Medications reviewed and updated, no changes recommended at this time.  

## 2013-04-14 NOTE — Assessment & Plan Note (Signed)
chronic bronchitis - chronic cough symptoms Complicated by medical compliance/confusion and lack of belief in medication efficacy driving noncompliance (suspect mild dementia of spouse AND patient) Also pt has macular degeneration so depends on spouse to supply her meds No clinical evidence for infection or CHF on exam (2D echo 12/2012 normal LVEF) Recent eval OVs here and pulm + IP/ER visits reviewed; also imaging For follow up CT chest late 04/2013 to monitor loculation as seen 10/2012 (persisting post op 08/2012)  Continue pulm meds and cough suppression - reviewed prescribed med schedule at length today with spouse and pt Continue eval and and tx by pulm (last OV reviewed - symptoms felt pscy>pulm as PFTs ok)

## 2013-04-16 ENCOUNTER — Encounter (HOSPITAL_COMMUNITY): Payer: Self-pay | Admitting: Emergency Medicine

## 2013-04-16 ENCOUNTER — Emergency Department (HOSPITAL_COMMUNITY): Payer: Medicare Other

## 2013-04-16 ENCOUNTER — Emergency Department (HOSPITAL_COMMUNITY)
Admission: EM | Admit: 2013-04-16 | Discharge: 2013-04-16 | Disposition: A | Discharge status: Home / Self Care | Payer: Medicare Other | Attending: Emergency Medicine | Admitting: Emergency Medicine

## 2013-04-16 DIAGNOSIS — J4489 Other specified chronic obstructive pulmonary disease: Secondary | ICD-10-CM | POA: Insufficient documentation

## 2013-04-16 DIAGNOSIS — Z79899 Other long term (current) drug therapy: Secondary | ICD-10-CM | POA: Insufficient documentation

## 2013-04-16 DIAGNOSIS — E119 Type 2 diabetes mellitus without complications: Secondary | ICD-10-CM | POA: Insufficient documentation

## 2013-04-16 DIAGNOSIS — K219 Gastro-esophageal reflux disease without esophagitis: Secondary | ICD-10-CM | POA: Insufficient documentation

## 2013-04-16 DIAGNOSIS — Z880 Allergy status to penicillin: Secondary | ICD-10-CM | POA: Insufficient documentation

## 2013-04-16 DIAGNOSIS — I10 Essential (primary) hypertension: Secondary | ICD-10-CM | POA: Insufficient documentation

## 2013-04-16 DIAGNOSIS — Z9861 Coronary angioplasty status: Secondary | ICD-10-CM | POA: Insufficient documentation

## 2013-04-16 DIAGNOSIS — R079 Chest pain, unspecified: Secondary | ICD-10-CM

## 2013-04-16 DIAGNOSIS — E039 Hypothyroidism, unspecified: Secondary | ICD-10-CM | POA: Insufficient documentation

## 2013-04-16 DIAGNOSIS — F329 Major depressive disorder, single episode, unspecified: Secondary | ICD-10-CM | POA: Insufficient documentation

## 2013-04-16 DIAGNOSIS — F3289 Other specified depressive episodes: Secondary | ICD-10-CM | POA: Insufficient documentation

## 2013-04-16 DIAGNOSIS — Z8659 Personal history of other mental and behavioral disorders: Secondary | ICD-10-CM | POA: Insufficient documentation

## 2013-04-16 DIAGNOSIS — Z7982 Long term (current) use of aspirin: Secondary | ICD-10-CM | POA: Insufficient documentation

## 2013-04-16 DIAGNOSIS — F411 Generalized anxiety disorder: Secondary | ICD-10-CM | POA: Insufficient documentation

## 2013-04-16 DIAGNOSIS — R072 Precordial pain: Secondary | ICD-10-CM | POA: Insufficient documentation

## 2013-04-16 DIAGNOSIS — Z9889 Other specified postprocedural states: Secondary | ICD-10-CM | POA: Insufficient documentation

## 2013-04-16 DIAGNOSIS — Z8709 Personal history of other diseases of the respiratory system: Secondary | ICD-10-CM | POA: Insufficient documentation

## 2013-04-16 DIAGNOSIS — J449 Chronic obstructive pulmonary disease, unspecified: Secondary | ICD-10-CM | POA: Insufficient documentation

## 2013-04-16 DIAGNOSIS — Z8679 Personal history of other diseases of the circulatory system: Secondary | ICD-10-CM | POA: Insufficient documentation

## 2013-04-16 DIAGNOSIS — Z87891 Personal history of nicotine dependence: Secondary | ICD-10-CM | POA: Insufficient documentation

## 2013-04-16 DIAGNOSIS — I509 Heart failure, unspecified: Secondary | ICD-10-CM | POA: Insufficient documentation

## 2013-04-16 LAB — BASIC METABOLIC PANEL
BUN: 21 mg/dL (ref 6–23)
Chloride: 103 mEq/L (ref 96–112)
GFR calc Af Amer: 60 mL/min — ABNORMAL LOW (ref >90)
Potassium: 3.6 mEq/L (ref 3.5–5.1)

## 2013-04-16 LAB — POCT I-STAT TROPONIN I
Troponin i, poc: 0.01 ng/mL (ref 0.00–0.08)
Troponin i, poc: 0.01 ng/mL (ref 0.00–0.08)

## 2013-04-16 LAB — CBC
HCT: 35 % — ABNORMAL LOW (ref 36.0–46.0)
RDW: 14.3 % (ref 11.5–15.5)
WBC: 16.8 10*3/uL — ABNORMAL HIGH (ref 4.0–10.5)

## 2013-04-16 MED ORDER — NITROGLYCERIN 0.4 MG SL SUBL: 0.4000 mg | SUBLINGUAL_TABLET | SUBLINGUAL | Status: DC | PRN

## 2013-04-16 NOTE — ED Notes (Signed)
Pt ambulated to restroom. 

## 2013-04-16 NOTE — ED Notes (Signed)
PT. ARRIVED WITH EMS FROM HOME REPORTS INTERMITTENT MID CHEST PAIN ONSET 12 MIDNIGHT WITH NAUSEA , DENIES SOB OR DIAPHORESIS , PT. CHEWED 4 BABY ASA PTA , DENIES CHEST PAIN AT ARRIVAL , PT. STATED HER CARDIOLOGIST IS DR. KATZ.

## 2013-04-16 NOTE — ED Notes (Signed)
Family at bedside. 

## 2013-04-16 NOTE — ED Notes (Signed)
MD at bedside. 

## 2013-04-16 NOTE — ED Provider Notes (Signed)
History     CSN: [id number]  Arrival date & time 04/16/13  0152   First MD Initiated Contact with Patient 04/16/13 0217      Chief Complaint  Patient presents with  . Chest Pain    (Consider location/radiation/quality/duration/timing/severity/associated sxs/prior treatment) The history is provided by the patient.  Paula [name] is a 79 y.o. female hx of AAA s/p repair, GERD, CHF here with chest pain. Substernal chest pain that woke her up from sleep around midnight. It lasted several minutes and now resolved on arrival. She was given asa 325 mg by EMS. No shortness of breath or nausea or diaphoresis. No cardiac stents in the past and no abdominal pain now.    Past Medical History  Diagnosis Date  . Depression   . Ejection fraction     EF 40%, echo, November, 2012  / in improved, EF 60%, echo, December, 2012  . Contrast media allergy     Patient feels poorly with contrast  . Status post AAA (abdominal aortic aneurysm) repair     Surgical repair, Dr. Hart Rochester, December 27, 2011  . Pre-syncope     vasovagal w/ heart block  . Parotid mass     has refused further eval 10/2011  . GERD (gastroesophageal reflux disease)   . Anxiety   . Facial tic     L sided spasms - Botox trial summer 2013, ?effective  . Macular degeneration   . AAA (abdominal aortic aneurysm)     s/p repair 12/2011  . Congestive heart failure     NL EF 10/2011 echo   . Diabetes mellitus type II, controlled   . Hypothyroidism   . Sinus bradycardia 09/19/2011    Occurring simultaneously with complete heart block   . Hypertension   . COPD (chronic obstructive pulmonary disease)   . Abdominal spasms     hemifacial-treated w/ botox xeomin -Dr Terrace Arabia  . Confusion 11/02/12    hospitalized for 10 days,amnestic fo rthe stay and did not recognize her home when husband brought her home.  . Sinus disorder tested 11/03/11    syndrome  . Syncope and collapse     Past Surgical History  Procedure Laterality Date  .  Cholecystectomy    . Knee cartilage surgery      left  . Sinus surgery with instatrak    . Appendectomy    . Oophorectomy      ovarian cyst  . Cataract extraction      bilateral  . Cardiac catheterization  11/04/11  . Bladder repair    . US echocardiography  10/14/11  . Abdominal aortic aneurysm repair  12/27/2011    Procedure: ANEURYSM ABDOMINAL AORTIC REPAIR;  Surgeon: Josephina Gip, MD;  Location: [facility] OR;  Service: Vascular;  Laterality: N/A;  Resection and Grafting Abdominal Aortic Aneurysm , Aorta Bi Iliac.  Marland Kitchen Pacemaker insertion  11/12    Family History  Problem Relation Age of Onset  . Esophageal cancer Father   . Cancer Father   . Breast cancer Sister   . Cancer Sister   . Colon cancer Sister   . Heart disease Sister   . Heart disease Mother   . Heart disease Son     History  Substance Use Topics  . Smoking status: Former Smoker -- 1.00 packs/day for 30 years    Types: Cigarettes    Quit date: 08/31/2011  . Smokeless tobacco: Never Used  . Alcohol Use: No    OB History  Grav Para Term Preterm Abortions TAB SAB Ect Mult Living                  Review of Systems  Cardiovascular: Positive for chest pain.  All other systems reviewed and are negative.    Allergies  Bactrim; Contrast media; Gadolinium derivatives; Iohexol; Penicillins; Sulfa antibiotics; Avelox; Ciprofloxacin; Delsym; and Hydromet  Home Medications   Current Outpatient Rx  Name  Route  Sig  Dispense  Refill  . albuterol (PROVENTIL HFA;VENTOLIN HFA) 108 (90 BASE) MCG/ACT inhaler   Inhalation   Inhale 2 puffs into the lungs every 6 (six) hours as needed for wheezing.   1 Inhaler   2   . ALPRAZolam (XANAX) 1 MG tablet   Oral   Take 1 tablet (1 mg total) by mouth 3 (three) times daily.   270 tablet   0   . aspirin EC 81 MG tablet   Oral   Take 81 mg by mouth every morning.          . baclofen (LIORESAL) 10 MG tablet   Oral   Take 0.5 tablets (5 mg total) by mouth at bedtime.    90 each   1   . calcium carbonate (OS-CAL) 600 MG TABS   Oral   Take 600 mg by mouth every other day.          . cetirizine (ZYRTEC) 10 MG tablet   Oral   Take 1 tablet (10 mg total) by mouth daily.   30 tablet   11   . Cholecalciferol (VITAMIN D3) 5000 UNITS TABS   Oral   Take 1 tablet by mouth every other day.          . furosemide (LASIX) 20 MG tablet      TAKE 1 BY MOUTH DAILY         . guaiFENesin (MUCINEX) 600 MG 12 hr tablet   Oral   Take 600 mg by mouth daily.         . Ipratropium-Albuterol (COMBIVENT) 20-100 MCG/ACT AERS respimat   Inhalation   Inhale 1 puff into the lungs every 6 (six) hours as needed for wheezing or shortness of breath.         . levothyroxine (SYNTHROID, LEVOTHROID) 175 MCG tablet   Oral   Take 1 tablet (175 mcg total) by mouth daily.   30 tablet   0   . losartan-hydrochlorothiazide (HYZAAR) 50-12.5 MG per tablet      TAKE 1 BY MOUTH DAILY         . metFORMIN (GLUCOPHAGE-XR) 500 MG 24 hr tablet   Oral   Take 1 tablet (500 mg total) by mouth daily with breakfast.   90 tablet   2   . ondansetron (ZOFRAN) 4 MG tablet   Oral   Take 1 tablet (4 mg total) by mouth every 8 (eight) hours as needed for nausea.   20 tablet   1   . pantoprazole (PROTONIX) 40 MG tablet   Oral   Take 1 tablet (40 mg total) by mouth daily.   90 tablet   1   . potassium chloride SA (K-DUR,KLOR-CON) 20 MEQ tablet   Oral   Take 1 tablet (20 mEq total) by mouth daily.   90 tablet   1   . predniSONE (DELTASONE) 20 MG tablet      Take 2 tablet for 3 days then 1 tablet for 3 days then stop   9 tablet  0   . sertraline (ZOLOFT) 50 MG tablet   Oral   Take 1 tablet (50 mg total) by mouth daily.   90 tablet   1     BP 145/73  Pulse 83  Temp(Src) 98.4 F (36.9 C) (Oral)  Resp 20  SpO2 95%  Physical Exam  Nursing note and vitals reviewed. Constitutional: She is oriented to person, place, and time. She appears well-developed and  well-nourished.  Calm, NAD   HENT:  Head: Normocephalic.  Mouth/Throat: Oropharynx is clear and moist.  Eyes: Conjunctivae are normal. Pupils are equal, round, and reactive to light.  Neck: Normal range of motion. Neck supple.  Cardiovascular: Normal rate, regular rhythm and normal heart sounds.   Pulmonary/Chest: Effort normal and breath sounds normal. No respiratory distress. She has no wheezes. She has no rales.  Abdominal: Soft. Bowel sounds are normal. She exhibits no distension. There is no tenderness. There is no rebound and no guarding.  Musculoskeletal: Normal range of motion. She exhibits no edema and no tenderness.  Neurological: She is alert and oriented to person, place, and time.  Skin: Skin is warm and dry.  Psychiatric: She has a normal mood and affect. Her behavior is normal. Judgment and thought content normal.    ED Course  Procedures (including critical care time)  Labs Reviewed  CBC - Abnormal; Notable for the following:    WBC 16.8 (*)    RBC 3.45 (*)    Hemoglobin 11.7 (*)    HCT 35.0 (*)    MCV 101.4 (*)    All other components within normal limits  BASIC METABOLIC PANEL - Abnormal; Notable for the following:    Glucose, Bld 146 (*)    Calcium 10.6 (*)    GFR calc non Af Amer 52 (*)    GFR calc Af Amer 60 (*)    All other components within normal limits  PRO B NATRIURETIC PEPTIDE  POCT I-STAT TROPONIN I  POCT I-STAT TROPONIN I   Dg Chest 2 View  04/16/2013   *RADIOLOGY REPORT*  Clinical Data: Sternal chest pain and nausea.  CHEST - 2 VIEW  Comparison: Chest radiograph performed 04/08/2013  Findings: The lungs are well-aerated.  Mild chronic peribronchial thickening is noted.  Mild bilateral atelectasis is seen.  The lungs are otherwise grossly clear.  Pulmonary vascularity is at the upper limits of normal.  The cardiomediastinal silhouette is mildly enlarged.  Calcification is noted within the aortic arch.  No acute osseous abnormalities are seen.  The  sternum appears grossly intact, though not well assessed.  IMPRESSION: Mild chronic lung changes noted, with mild bilateral atelectasis. Lungs otherwise grossly clear.  Mild cardiomegaly noted.  The sternum appears grossly intact.   Original Report Authenticated By: Tonia Ghent, M.D.     1. Chest pain      Date: 04/16/2013  Rate: 75  Rhythm: normal sinus rhythm  QRS Axis: normal  Intervals: PR prolonged  ST/T Wave abnormalities: normal  Conduction Disutrbances:first-degree A-V block   Narrative Interpretation:   Old EKG Reviewed: unchanged    MDM  Paula [name] is a 79 y.o. female here with chest pain. Chest pain atypical. She has been followed up with [facility] cardiology. I called the [facility] cardiology fellow, who evaluated her and feels that she has atypical chest pain and should get trop 6 hr from onset of symptoms. Trop neg x 2 (with the second one at 6:30 AM more than 6 hrs from symptoms). Remains pain  free in the ED. I told her to continue taking aspirin and f/u with her cardiologist.          Richardean Canal, MD 04/16/13 [phone]

## 2013-04-20 ENCOUNTER — Ambulatory Visit: Payer: Medicare Other | Attending: Internal Medicine | Admitting: Physical Therapy

## 2013-04-20 DIAGNOSIS — R269 Unspecified abnormalities of gait and mobility: Secondary | ICD-10-CM | POA: Insufficient documentation

## 2013-04-20 DIAGNOSIS — IMO0001 Reserved for inherently not codable concepts without codable children: Secondary | ICD-10-CM | POA: Insufficient documentation

## 2013-04-20 DIAGNOSIS — R5381 Other malaise: Secondary | ICD-10-CM | POA: Insufficient documentation

## 2013-04-20 DIAGNOSIS — M6281 Muscle weakness (generalized): Secondary | ICD-10-CM | POA: Insufficient documentation

## 2013-04-22 ENCOUNTER — Ambulatory Visit: Payer: Medicare Other | Admitting: Physical Therapy

## 2013-04-27 ENCOUNTER — Ambulatory Visit: Payer: Medicare Other | Admitting: Physical Therapy

## 2013-04-28 ENCOUNTER — Ambulatory Visit (INDEPENDENT_AMBULATORY_CARE_PROVIDER_SITE_OTHER): Payer: Medicare Other | Admitting: Psychology

## 2013-04-28 DIAGNOSIS — F4320 Adjustment disorder, unspecified: Secondary | ICD-10-CM

## 2013-04-29 ENCOUNTER — Telehealth: Payer: Self-pay

## 2013-04-29 ENCOUNTER — Ambulatory Visit: Payer: Medicare Other | Admitting: Physical Therapy

## 2013-04-29 NOTE — Telephone Encounter (Signed)
Patient's husband calls regarding paperwork that was faxed over from [employer] [facility].

## 2013-05-03 NOTE — Telephone Encounter (Signed)
Form given to carolyn to complete for md...lmb

## 2013-05-03 NOTE — Telephone Encounter (Signed)
Form place on md desk for completion...Paula [name]

## 2013-05-04 ENCOUNTER — Ambulatory Visit: Payer: Medicare Other | Admitting: Rehabilitative and Restorative Service Providers"

## 2013-05-04 ENCOUNTER — Ambulatory Visit (INDEPENDENT_AMBULATORY_CARE_PROVIDER_SITE_OTHER): Payer: Medicare Other | Admitting: *Deleted

## 2013-05-04 DIAGNOSIS — Z111 Encounter for screening for respiratory tuberculosis: Secondary | ICD-10-CM

## 2013-05-06 ENCOUNTER — Ambulatory Visit: Payer: Medicare Other | Admitting: Rehabilitative and Restorative Service Providers"

## 2013-05-06 ENCOUNTER — Telehealth: Payer: Self-pay | Admitting: *Deleted

## 2013-05-06 NOTE — Telephone Encounter (Signed)
Melanie called requesting MD report on pt.  Left VM stating that pt had come in to the clinic had PPD read and report was being faxed to their facility by [facility], CMA.

## 2013-05-07 ENCOUNTER — Ambulatory Visit (INDEPENDENT_AMBULATORY_CARE_PROVIDER_SITE_OTHER): Payer: Medicare Other | Admitting: Internal Medicine

## 2013-05-07 ENCOUNTER — Encounter: Payer: Self-pay | Admitting: Internal Medicine

## 2013-05-07 VITALS — BP 122/68 | HR 90 | Temp 98.2°F | Wt 180.1 lb

## 2013-05-07 DIAGNOSIS — R269 Unspecified abnormalities of gait and mobility: Secondary | ICD-10-CM

## 2013-05-07 DIAGNOSIS — R2689 Other abnormalities of gait and mobility: Secondary | ICD-10-CM

## 2013-05-07 DIAGNOSIS — R29818 Other symptoms and signs involving the nervous system: Secondary | ICD-10-CM

## 2013-05-07 DIAGNOSIS — E119 Type 2 diabetes mellitus without complications: Secondary | ICD-10-CM

## 2013-05-07 LAB — TB SKIN TEST
Induration: 0 mm
TB Skin Test: NEGATIVE

## 2013-05-07 MED ORDER — PREDNISONE (PAK) 10 MG PO TABS: 10.0000 mg | ORAL_TABLET | ORAL | Status: DC

## 2013-05-07 NOTE — Assessment & Plan Note (Signed)
Diet controlled until 02/2011 when a1c>7 - started metformin xr 500 qd On ARB - prior statin stopped years ago (pt unsure why) Check a1c q6-53mo, adjust as needed  Lab Results  Component Value Date   HGBA1C 6.1* 04/08/2013   patient advised on anticipated increase in cbgs due to steroid effect for next several days - pt will call if cbg>200

## 2013-05-07 NOTE — Progress Notes (Signed)
Subjective:    Patient ID: Paula [name], female    DOB: 09-May-1927, 79 y.o.   MRN: [id number]  HPI  Here for followup - see CC  Feels "slow" to get feet going Denies pain or numbness - no joint swelling Chronic low back pain without change  Past Medical History  Diagnosis Date  . Depression   . Ejection fraction     EF 40%, echo, November, 2012  / in improved, EF 60%, echo, December, 2012  . Contrast media allergy     Patient feels poorly with contrast  . Status post AAA (abdominal aortic aneurysm) repair     Surgical repair, Dr. Hart Rochester, December 27, 2011  . Pre-syncope     vasovagal w/ heart block  . Parotid mass     has refused further eval 10/2011  . GERD (gastroesophageal reflux disease)   . Anxiety   . Facial tic     L sided spasms - Botox trial summer 2013, ?effective  . Macular degeneration   . AAA (abdominal aortic aneurysm)     s/p repair 12/2011  . Congestive heart failure     NL EF 10/2011 echo   . Diabetes mellitus type II, controlled   . Hypothyroidism   . Sinus bradycardia 09/19/2011    Occurring simultaneously with complete heart block   . Hypertension   . COPD (chronic obstructive pulmonary disease)   . Abdominal spasms     hemifacial-treated w/ botox xeomin -Dr Terrace Arabia  . Confusion 11/02/12    hospitalized for 10 days,amnestic fo rthe stay and did not recognize her home when husband brought her home.  . Sinus disorder tested 11/03/11    syndrome  . Syncope and collapse     Review of Systems  HENT: Positive for hoarse voice.   Respiratory: Positive for cough (chronic) and shortness of breath (chronic).   Musculoskeletal: Positive for gait problem. Negative for myalgias and joint swelling.  Neurological: Positive for speech difficulty (chronic "drawing up of mouth", follows with neuro for same) and weakness. Negative for headaches.  Psychiatric/Behavioral: Negative for sleep disturbance and dysphoric mood.       Objective:   Physical Exam  BP  122/68  Pulse 90  Temp(Src) 98.2 F (36.8 C) (Oral)  Wt 180 lb 1.9 oz (81.702 kg)  BMI 30.9 kg/m2  SpO2 94% Wt Readings from Last 3 Encounters:  05/07/13 180 lb 1.9 oz (81.702 kg)  04/14/13 175 lb (79.379 kg)  04/10/13 180 lb (81.647 kg)   Gen: NAD, nontoxic. hoarse without change. Spouse at side Lungs: CTA B - faint end exp wheeze and RML fine crackles; diminished air movement at bases CV: RRR, no edema BLE MSkel: Back: full range of motion of thoracic and lumbar spine. Non tender to palpation. Negative straight leg raise. DTR's are symmetrically intact. Sensation intact in all dermatomes of the lower extremities. Full strength to manual muscle testing. patient is able to heel toe walk with use of walker and ambulates with cautious gait. No gross deformity or joint swelling Psychiatric: tangential    Lab Results  Component Value Date   WBC 16.8* 04/16/2013   HGB 11.7* 04/16/2013   HCT 35.0* 04/16/2013   PLT 292 04/16/2013   GLUCOSE 146* 04/16/2013   CHOL 149 02/10/2013   TRIG 218.0* 02/10/2013   HDL 30.10* 02/10/2013   LDLDIRECT 90.6 02/10/2013   LDLCALC 80 11/04/2011   ALT 20 04/09/2013   AST 21 04/09/2013   NA 142 04/16/2013  K 3.6 04/16/2013   CL 103 04/16/2013   CREATININE 0.97 04/16/2013   BUN 21 04/16/2013   CO2 29 04/16/2013   TSH 1.797 04/08/2013   INR 1.06 08/24/2012   HGBA1C 6.1* 04/08/2013       Assessment & Plan:   See problem list. Medications and labs reviewed today.  Difficulty walking - suspect progressive gait disorder from lumbar spinal stenosis - ?neurogenic claudication - tx with systemic pred x 6 days and request HHPT for balance/gait training

## 2013-05-07 NOTE — Patient Instructions (Signed)
It was good to see you today. We have reviewed your prior records including labs and tests today Prednisone taper for next 6 days to help back pain and leg strength - Your prescription(s) have been submitted to your pharmacy. Please take as directed and contact our office if you believe you are having problem(s) with the medication(s). Referral to home health physical therapy to assist you with your balance and gait training - our office will call regarding this appointment once made Followup as scheduled, call sooner if problems Good luck with your move to assisted living!

## 2013-05-10 ENCOUNTER — Ambulatory Visit: Payer: Medicare Other | Admitting: Rehabilitative and Restorative Service Providers"

## 2013-05-10 ENCOUNTER — Telehealth: Payer: Self-pay | Admitting: Internal Medicine

## 2013-05-10 NOTE — Telephone Encounter (Signed)
[facility] forward to 4 pages to Dr.Leschber

## 2013-05-12 ENCOUNTER — Ambulatory Visit: Payer: Medicare Other | Admitting: Rehabilitative and Restorative Service Providers"

## 2013-05-17 ENCOUNTER — Ambulatory Visit (INDEPENDENT_AMBULATORY_CARE_PROVIDER_SITE_OTHER)
Admission: RE | Admit: 2013-05-17 | Discharge: 2013-05-17 | Disposition: A | Discharge status: Home / Self Care | Payer: Medicare Other | Source: Ambulatory Visit | Attending: Pulmonary Disease | Admitting: Pulmonary Disease

## 2013-05-17 DIAGNOSIS — J189 Pneumonia, unspecified organism: Secondary | ICD-10-CM

## 2013-05-19 ENCOUNTER — Encounter: Payer: Self-pay | Admitting: Pulmonary Disease

## 2013-05-19 ENCOUNTER — Ambulatory Visit (INDEPENDENT_AMBULATORY_CARE_PROVIDER_SITE_OTHER): Payer: Medicare Other | Admitting: Pulmonary Disease

## 2013-05-19 VITALS — BP 102/70 | HR 91 | Temp 100.5°F | Ht 64.0 in | Wt 174.8 lb

## 2013-05-19 DIAGNOSIS — J189 Pneumonia, unspecified organism: Secondary | ICD-10-CM

## 2013-05-19 MED ORDER — CEFDINIR 300 MG PO CAPS: 300.0000 mg | ORAL_CAPSULE | Freq: Two times a day (BID) | ORAL | Status: DC

## 2013-05-19 NOTE — Progress Notes (Signed)
Subjective:    Patient ID: Paula [name], female    DOB: [birth year], 79 y.o.   MRN: [id number]  HPI  79 year old ex smoker ( quit 10/12) for FU of COPD  She smoked for >50 yrs , quit 2012 .  She had Recurrent PNA - 2 episodes during 2013  -She now has no residual cough or shortness of breath, no productive sputum . She underwent urgent surgery for abdominal aortic aneurysm by Dr. Hart Rochester in 2/13. She never recovered fully from this, she still ambulates with a walker.  CT scan of the chest 12/13 showed Loculated left pleural effusion with suspected rounded atelectasis in the left lower lobe. Evidence of prior granulomatous disease with calcified left lower lobe granuloma, calcified left hilar lymph nodes and calcified granulomas in the liver and spleen.  CT abdomen clarified a cystic mass in the abdomen, likely a pancreatic pseudocyst.  Review of her imaging study shows left lower lobe airspace disease dating back to 10/13.  Cxr 01/02/13 showed bilateral small pleural effusions, mild interstitial pulm edema.  Of note, she is allergic to contrast media and had a reaction with neck swelling requiring steroids.  ENT evaluation by Dr. 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  PFT shows mild airway obstruction- fev1 70%, ratio 73 -no BD response, TLC 70% c/w midl restriction Pt states her breathing has improved some per spouse. Wheezing is not as bad. Has a little dry cough. Denies any chets tx.  She is on Combivent Four times a day . She also uses ventolin most days. No hemoptysis , edema, chest pain , palpitations or orthopnea. No fever or discolored mucus  CT abd/pelvis 02/2012 showed subtle omental/peritoneal nodularity - LLL  rounded atelectasis was stable     05/19/2013 CT chest reviewed  - LLL nodule is stable, Pneumonia like markings in the right upper and right lower lobes Has she been coughing ,febrile T 100.5 Denies dysphagia Not feeling well at all, per husband  Allergies  Allergen Reactions  . Bactrim (Sulfamethoxazole-Tmp Ds)   . Contrast Media (Iodinated Diagnostic Agents) Anaphylaxis  . Gadolinium Derivatives   . Iohexol Anaphylaxis, Shortness Of Breath and Swelling     Desc: PT STATES SHE HAD A SEVERE REACTION TO IV CONRAST WITH THROAT SWELLING AND SOB. SHE WAS ADMITTED TO THE HOSPITAL. SHE HAS NEVER HAD CONTRAST AGAIN.   Marland Kitchen Penicillins Rash  . Sulfa Antibiotics Anaphylaxis  . Avelox (Moxifloxacin Hcl In Nacl) Rash  . Ciprofloxacin Itching  . Delsym (Dextromethorphan) Other (See Comments)    Hallucination  . Hydromet (Hydrocodone-Homatropine) Other (See Comments)    Pt states med make her hallucinate     Review of Systems neg for any significant sore throat, dysphagia, itching, sneezing, nasal congestion or excess/ purulent secretions, fever, chills, sweats, unintended wt loss, pleuritic or exertional cp, hempoptysis, orthopnea pnd or change in chronic leg swelling. Also denies presyncope, palpitations, heartburn, abdominal pain, nausea, vomiting, diarrhea or change in bowel or urinary habits, dysuria,hematuria, rash, arthralgias, visual complaints, headache, numbness weakness or ataxia.     Objective:   Physical Exam  Gen. Pleasant, well-nourished, in no distress, normal affect ENT - no lesions, no post nasal drip Neck: No JVD,  no thyromegaly, no carotid bruits Lungs: no use of accessory muscles, no dullness to percussion, clear without rales or rhonchi  Cardiovascular: Rhythm regular, heart sounds  normal, no murmurs or gallops, no peripheral edema Abdomen: soft and non-tender, no hepatosplenomegaly, BS normal. Musculoskeletal: No deformities, no cyanosis or clubbing Neuro:  alert,  non focal       Assessment & Plan:

## 2013-05-19 NOTE — Assessment & Plan Note (Signed)
Right-sided infiltrates make me suspect aspiration Given her allergy to host of antibiotics, we'll proceed with Omnicef x 7ds & rpt CXR after No follow up imaging needed for nodule

## 2013-05-19 NOTE — Patient Instructions (Signed)
You have pneumonia Omnicef x 7days CXR next visit

## 2013-05-21 ENCOUNTER — Encounter: Payer: Self-pay | Admitting: Emergency Medicine

## 2013-05-21 ENCOUNTER — Telehealth: Payer: Self-pay | Admitting: Emergency Medicine

## 2013-05-21 ENCOUNTER — Telehealth: Payer: Self-pay | Admitting: Pulmonary Disease

## 2013-05-21 ENCOUNTER — Ambulatory Visit (INDEPENDENT_AMBULATORY_CARE_PROVIDER_SITE_OTHER): Payer: Medicare Other | Admitting: Emergency Medicine

## 2013-05-21 VITALS — BP 122/64 | HR 104 | Temp 101.1°F | Ht 65.0 in | Wt 177.6 lb

## 2013-05-21 DIAGNOSIS — J189 Pneumonia, unspecified organism: Secondary | ICD-10-CM

## 2013-05-21 MED ORDER — IPRATROPIUM-ALBUTEROL 20-100 MCG/ACT IN AERS: 1.0000 | INHALATION_SPRAY | Freq: Four times a day (QID) | RESPIRATORY_TRACT | Status: DC | PRN

## 2013-05-21 NOTE — Telephone Encounter (Signed)
Pt's spouse "just called back again" and decided that pt will take the 2:15 appt slot w/ RB. Nothing further needed per spouse. Paula [name]

## 2013-05-21 NOTE — Telephone Encounter (Signed)
rx for the combivent has been sent [facility] per pharmacy request.

## 2013-05-21 NOTE — Telephone Encounter (Signed)
Pt's spouse called back again. I offered an ov w/ either KC or RB for 2:15 but that wouldn't work for them since a rehab person was coming to their house. Requested something later today. Call back "asap". Hazel Sams

## 2013-05-21 NOTE — Assessment & Plan Note (Signed)
RUL PNA on recent CT  Scan. She can't really tell me how she is worse, just that she is no better. She stopped her cough medications and isn't taking combivent. She does not look toxic, does not need to be admitted.  - reminded her to use the combivent, refilled it - recommended using her tessalon, robitussin - finish omnicef - rov TP and RA as planned

## 2013-05-21 NOTE — Patient Instructions (Addendum)
Please complete your Omnicef as directed until they are all gone.  Start using your combivent up to every 6 hours if needed for shortness of breath or cough Start using your Tessalon perles as needed for cough Start using over-the-counter robitussin as directed.  Follow with T. Parrett and Dr Vassie Loll as planned.

## 2013-05-21 NOTE — Progress Notes (Signed)
Subjective:    Patient ID: Paula [name], female    DOB: 1927-08-03, 79 y.o.   MRN: [id number]  HPI  79 year old ex smoker ( quit 10/12) for FU of COPD  She smoked for >50 yrs , quit 2012 .  She had Recurrent PNA - 2 episodes during 2013  -She now has no residual cough or shortness of breath, no productive sputum . She underwent urgent surgery for abdominal aortic aneurysm by Dr. Hart Rochester in 2/13. She never recovered fully from this, she still ambulates with a walker.  CT scan of the chest 12/13 showed Loculated left pleural effusion with suspected rounded atelectasis in the left lower lobe. Evidence of prior granulomatous disease with calcified left lower lobe granuloma, calcified left hilar lymph nodes and calcified granulomas in the liver and spleen.  CT abdomen clarified a cystic mass in the abdomen, likely a pancreatic pseudocyst.  Review of her imaging study shows left lower lobe airspace disease dating back to 10/13.  Cxr 01/02/13 showed bilateral small pleural effusions, mild interstitial pulm edema.  Of note, she is allergic to contrast media and had a reaction with neck swelling requiring steroids.  ENT evaluation by Dr. 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  PFT shows mild airway obstruction- fev1 70%, ratio 73 -no BD response, TLC 70% c/w midl restriction Pt states her breathing has improved some per spouse. Wheezing is not as bad. Has a little dry cough. Denies any chets tx.  She is on Combivent Four times a day . She also uses ventolin most days. No hemoptysis , edema, chest pain , palpitations or orthopnea. No fever or discolored mucus  CT abd/pelvis 02/2012 showed subtle omental/peritoneal nodularity - LLL  rounded atelectasis was stable   05/19/13 --  CT chest reviewed  - LLL nodule is stable, Pneumonia like markings in the right upper and right lower lobes Has she been coughing ,febrile T 100.5 Denies dysphagia Not feeling well at all, per husband  Acute OV 05/21/13 -- pt with COPD, LLL nodule being followed and stable CT scan 05/18/13. Was just seen and started on Omnicef 7/9. Returns now c/o continued fatigue, stable SOB, cough that has kept her up at night. She tried tessalon perles and these help. Has combivent prn, but has not used it. She has tried delsym, hydrocodone cough syrup. Has not tolerated these.    Allergies  Allergen Reactions  . Bactrim (Sulfamethoxazole-Tmp Ds)   . Contrast Media (Iodinated Diagnostic Agents) Anaphylaxis  . Gadolinium Derivatives   . Iohexol Anaphylaxis, Shortness Of Breath and Swelling     Desc: PT STATES SHE HAD A SEVERE REACTION TO IV CONRAST WITH THROAT SWELLING AND SOB. SHE WAS ADMITTED TO THE HOSPITAL. SHE HAS NEVER HAD CONTRAST AGAIN.   Marland Kitchen Penicillins Rash  . Sulfa Antibiotics Anaphylaxis  . Avelox (Moxifloxacin Hcl In Nacl) Rash  . Ciprofloxacin Itching  . Delsym (Dextromethorphan) Other (See Comments)    Hallucination  . Hydromet (Hydrocodone-Homatropine) Other (See Comments)    Pt states med make her hallucinate     Review of Systems neg for any significant sore throat, dysphagia, itching, sneezing, nasal congestion or excess/ purulent secretions, fever, chills, sweats, unintended wt loss, pleuritic  or exertional cp, hempoptysis, orthopnea pnd or change in chronic leg swelling. Also denies presyncope, palpitations, heartburn, abdominal pain, nausea, vomiting, diarrhea or change in bowel or urinary habits, dysuria,hematuria, rash, arthralgias, visual complaints, headache, numbness weakness or ataxia.     Objective:   Physical Exam Filed Vitals:   05/21/13 1438  BP: 122/64  Pulse: 104  Temp: 101.1 F (38.4 C)  TempSrc: Oral  Height: 5'  5" (1.651 m)  Weight: 177 lb 9.6 oz (80.559 kg)  SpO2: 93%    Gen. Pleasant, well-nourished, in no distress, normal affect ENT - no lesions, no post nasal drip Neck: No JVD, no thyromegaly, no carotid bruits Lungs: no use of accessory muscles, no dullness to percussion, clear without rales or rhonchi  Cardiovascular: Rhythm regular, heart sounds  normal, no murmurs or gallops, no peripheral edema Abdomen: soft and non-tender, no hepatosplenomegaly, BS normal. Musculoskeletal: No deformities, no cyanosis or clubbing Neuro:  alert, non focal       Assessment & Plan:  Recurrent pneumonia RUL PNA on recent CT  Scan. She can't really tell me how she is worse, just that she is no better. She stopped her cough medications and isn't taking combivent. She does not look toxic, does not need to be admitted.  - reminded her to use the combivent, refilled it - recommended using her tessalon, robitussin - finish omnicef - rov TP and RA as planned

## 2013-05-27 ENCOUNTER — Ambulatory Visit (INDEPENDENT_AMBULATORY_CARE_PROVIDER_SITE_OTHER): Payer: Medicare Other | Admitting: Psychology

## 2013-05-27 ENCOUNTER — Ambulatory Visit (INDEPENDENT_AMBULATORY_CARE_PROVIDER_SITE_OTHER): Payer: Medicare Other | Admitting: Adult Health

## 2013-05-27 ENCOUNTER — Ambulatory Visit (INDEPENDENT_AMBULATORY_CARE_PROVIDER_SITE_OTHER)
Admission: RE | Admit: 2013-05-27 | Discharge: 2013-05-27 | Disposition: A | Discharge status: Home / Self Care | Payer: Medicare Other | Source: Ambulatory Visit | Attending: Adult Health | Admitting: Adult Health

## 2013-05-27 ENCOUNTER — Encounter: Payer: Self-pay | Admitting: Adult Health

## 2013-05-27 ENCOUNTER — Other Ambulatory Visit: Payer: Self-pay | Admitting: Adult Health

## 2013-05-27 VITALS — BP 126/84 | HR 88 | Temp 99.2°F | Ht 65.0 in | Wt 179.8 lb

## 2013-05-27 DIAGNOSIS — F4320 Adjustment disorder, unspecified: Secondary | ICD-10-CM

## 2013-05-27 DIAGNOSIS — J439 Emphysema, unspecified: Secondary | ICD-10-CM

## 2013-05-27 DIAGNOSIS — J189 Pneumonia, unspecified organism: Secondary | ICD-10-CM

## 2013-05-27 DIAGNOSIS — J438 Other emphysema: Secondary | ICD-10-CM

## 2013-05-27 NOTE — Assessment & Plan Note (Addendum)
Clinically improving , no acute process noted on CXR today  No further abx at this time   Plan  Delsym 2 tsp Twice daily  As needed  Cough  Follow up Dr. Vassie Loll   2 months and As needed   Please contact office for sooner follow up if symptoms do not improve or worsen or seek emergency care

## 2013-05-27 NOTE — Assessment & Plan Note (Signed)
Compensated on present regimen without flare  

## 2013-05-27 NOTE — Progress Notes (Signed)
Subjective:    Patient ID: Paula [name], female    DOB: [birth year], 79 y.o.   MRN: [id number]  HPI   79 year old ex smoker ( quit 10/12) for FU of COPD  She smoked for >50 yrs , quit 2012 .  She had Recurrent PNA - 2 episodes during 2013  -She now has no residual cough or shortness of breath, no productive sputum . She underwent urgent surgery for abdominal aortic aneurysm by Dr. Hart Rochester in 2/13. She never recovered fully from this, she still ambulates with a walker.  CT scan of the chest 12/13 showed Loculated left pleural effusion with suspected rounded atelectasis in the left lower lobe. Evidence of prior granulomatous disease with calcified left lower lobe granuloma, calcified left hilar lymph nodes and calcified granulomas in the liver and spleen.  CT abdomen clarified a cystic mass in the abdomen, likely a pancreatic pseudocyst.  Review of her imaging study shows left lower lobe airspace disease dating back to 10/13.  Cxr 01/02/13 showed bilateral small pleural effusions, mild interstitial pulm edema.  Of note, she is allergic to contrast media and had a reaction with neck swelling requiring steroids.  ENT evaluation by Dr. 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  PFT shows mild airway obstruction- fev1 70%, ratio 73 -no BD response, TLC 70% c/w midl restriction Pt states her breathing has improved some per spouse. Wheezing is not as bad. Has a little dry cough. Denies any chets tx.  She is on Combivent Four times a day . She also uses ventolin most days. No hemoptysis , edema, chest pain , palpitations or orthopnea. No fever or discolored mucus  CT abd/pelvis 02/2012 showed subtle omental/peritoneal nodularity -  LLL rounded atelectasis was stable   05/19/13 --  CT chest reviewed  - LLL nodule is stable, Pneumonia like markings in the right upper and right lower lobes Has she been coughing ,febrile T 100.5 Denies dysphagia Not feeling well at all, per husband  Acute OV 05/21/13 -- pt with COPD, LLL nodule being followed and stable CT scan 05/18/13. Was just seen and started on Omnicef 7/9. Returns now c/o continued fatigue, stable SOB, cough that has kept her up at night. She tried tessalon perles and these help. Has combivent prn, but has not used it. She has tried delsym, hydrocodone cough syrup. Has not tolerated these.  >>finish omnicef x 7 d    05/27/2013 PNA follow up  Pt returns for follow up . Tx for RUL/RLL PNA w/ Omnicef x 7 days (has multiple abx intolerances) .  She is feeling better. Fevers are resolving.  No discolored mucus. Still weak with little energy.  Cough is decreasing. No hemotpysis, leg swelling.  Good appetite . No n/v. No dysphagia or over reflux.  CXR today shows no acute infiltrate- chronic changes.     Allergies  Allergen Reactions  . Bactrim (Sulfamethoxazole-Tmp Ds)   . Contrast Media (Iodinated Diagnostic Agents) Anaphylaxis  . Gadolinium Derivatives   . Iohexol Anaphylaxis, Shortness Of Breath and Swelling     Desc: PT STATES SHE HAD A SEVERE REACTION TO IV CONRAST WITH THROAT SWELLING AND SOB. SHE WAS ADMITTED TO THE HOSPITAL. SHE HAS NEVER  HAD CONTRAST AGAIN.   Marland Kitchen Penicillins Rash  . Sulfa Antibiotics Anaphylaxis  . Avelox (Moxifloxacin Hcl In Nacl) Rash  . Ciprofloxacin Itching  . Delsym (Dextromethorphan) Other (See Comments)    Hallucination  . Hydromet (Hydrocodone-Homatropine) Other (See Comments)    Pt states med make her hallucinate     Review of Systems  neg for any significant sore throat, dysphagia, itching, sneezing, nasal congestion or excess/ purulent secretions, fever, chills, sweats, unintended wt loss, pleuritic or exertional cp,  hempoptysis, orthopnea pnd or change in chronic leg swelling. Also denies presyncope, palpitations, heartburn, abdominal pain, nausea, vomiting, diarrhea or change in bowel or urinary habits, dysuria,hematuria, rash, arthralgias, visual complaints, headache, numbness weakness or ataxia.     Objective:   Physical Exam  Filed Vitals:   05/27/13 1218  BP: 126/84  Pulse: 88  Temp: 99.2 F (37.3 C)  TempSrc: Oral  Height: 5\' 5"  (1.651 m)  Weight: 179 lb 12.8 oz (81.557 kg)  SpO2: 96%    Gen. Pleasant, well-nourished, in no distress, normal affect ENT - no lesions, no post nasal drip Neck: No JVD, no thyromegaly, no carotid bruits Lungs: no use of accessory muscles, no dullness to percussion, clear without rales or rhonchi  Cardiovascular: Rhythm regular, heart sounds  normal, no murmurs or gallops, no peripheral edema Abdomen: soft and non-tender, no hepatosplenomegaly, BS normal. Musculoskeletal: No deformities, no cyanosis or clubbing Neuro:  alert, non focal  CXR 05/27/2013 Chronic mild interstitial prominence. Stable calcified granuloma  left lower lobe posteriorly. Focal atelectasis left lower lobe  posteriorly is stable. No acute infiltrate or pulmonary edema.  Atherosclerotic calcifications of thoracic aorta again noted. Bony  thorax is stable      Assessment & Plan:  No problem-specific assessment & plan notes found for this encounter.

## 2013-05-27 NOTE — Patient Instructions (Addendum)
Delsym 2 tsp Twice daily  As needed  Cough  Follow up Dr. Vassie Loll   2 months and As needed   Please contact office for sooner follow up if symptoms do not improve or worsen or seek emergency care

## 2013-05-30 ENCOUNTER — Emergency Department (HOSPITAL_COMMUNITY)
Admission: EM | Admit: 2013-05-30 | Discharge: 2013-05-30 | Disposition: A | Discharge status: Home / Self Care | Payer: Medicare Other | Attending: Emergency Medicine | Admitting: Emergency Medicine

## 2013-05-30 ENCOUNTER — Other Ambulatory Visit: Payer: Self-pay

## 2013-05-30 ENCOUNTER — Encounter (HOSPITAL_COMMUNITY): Payer: Self-pay | Admitting: *Deleted

## 2013-05-30 ENCOUNTER — Emergency Department (HOSPITAL_COMMUNITY): Payer: Medicare Other

## 2013-05-30 DIAGNOSIS — F411 Generalized anxiety disorder: Secondary | ICD-10-CM | POA: Insufficient documentation

## 2013-05-30 DIAGNOSIS — I10 Essential (primary) hypertension: Secondary | ICD-10-CM | POA: Insufficient documentation

## 2013-05-30 DIAGNOSIS — Z8659 Personal history of other mental and behavioral disorders: Secondary | ICD-10-CM | POA: Insufficient documentation

## 2013-05-30 DIAGNOSIS — Z8679 Personal history of other diseases of the circulatory system: Secondary | ICD-10-CM | POA: Insufficient documentation

## 2013-05-30 DIAGNOSIS — Z8709 Personal history of other diseases of the respiratory system: Secondary | ICD-10-CM | POA: Insufficient documentation

## 2013-05-30 DIAGNOSIS — Z8669 Personal history of other diseases of the nervous system and sense organs: Secondary | ICD-10-CM | POA: Insufficient documentation

## 2013-05-30 DIAGNOSIS — R630 Anorexia: Secondary | ICD-10-CM | POA: Insufficient documentation

## 2013-05-30 DIAGNOSIS — Z950 Presence of cardiac pacemaker: Secondary | ICD-10-CM | POA: Insufficient documentation

## 2013-05-30 DIAGNOSIS — H539 Unspecified visual disturbance: Secondary | ICD-10-CM | POA: Insufficient documentation

## 2013-05-30 DIAGNOSIS — Z9889 Other specified postprocedural states: Secondary | ICD-10-CM | POA: Insufficient documentation

## 2013-05-30 DIAGNOSIS — J4489 Other specified chronic obstructive pulmonary disease: Secondary | ICD-10-CM | POA: Insufficient documentation

## 2013-05-30 DIAGNOSIS — E119 Type 2 diabetes mellitus without complications: Secondary | ICD-10-CM | POA: Insufficient documentation

## 2013-05-30 DIAGNOSIS — R109 Unspecified abdominal pain: Secondary | ICD-10-CM | POA: Insufficient documentation

## 2013-05-30 DIAGNOSIS — G514 Facial myokymia: Secondary | ICD-10-CM

## 2013-05-30 DIAGNOSIS — Z79899 Other long term (current) drug therapy: Secondary | ICD-10-CM | POA: Insufficient documentation

## 2013-05-30 DIAGNOSIS — Z87891 Personal history of nicotine dependence: Secondary | ICD-10-CM | POA: Insufficient documentation

## 2013-05-30 DIAGNOSIS — F329 Major depressive disorder, single episode, unspecified: Secondary | ICD-10-CM | POA: Insufficient documentation

## 2013-05-30 DIAGNOSIS — R110 Nausea: Secondary | ICD-10-CM | POA: Insufficient documentation

## 2013-05-30 DIAGNOSIS — J449 Chronic obstructive pulmonary disease, unspecified: Secondary | ICD-10-CM | POA: Insufficient documentation

## 2013-05-30 DIAGNOSIS — F3289 Other specified depressive episodes: Secondary | ICD-10-CM | POA: Insufficient documentation

## 2013-05-30 DIAGNOSIS — E039 Hypothyroidism, unspecified: Secondary | ICD-10-CM | POA: Insufficient documentation

## 2013-05-30 DIAGNOSIS — Z7982 Long term (current) use of aspirin: Secondary | ICD-10-CM | POA: Insufficient documentation

## 2013-05-30 DIAGNOSIS — K219 Gastro-esophageal reflux disease without esophagitis: Secondary | ICD-10-CM | POA: Insufficient documentation

## 2013-05-30 DIAGNOSIS — I509 Heart failure, unspecified: Secondary | ICD-10-CM | POA: Insufficient documentation

## 2013-05-30 DIAGNOSIS — Z880 Allergy status to penicillin: Secondary | ICD-10-CM | POA: Insufficient documentation

## 2013-05-30 DIAGNOSIS — M62838 Other muscle spasm: Secondary | ICD-10-CM | POA: Insufficient documentation

## 2013-05-30 LAB — URINALYSIS, ROUTINE W REFLEX MICROSCOPIC
Bilirubin Urine: NEGATIVE
Glucose, UA: NEGATIVE mg/dL
Nitrite: NEGATIVE
Specific Gravity, Urine: 1.024 (ref 1.005–1.030)
pH: 5 (ref 5.0–8.0)

## 2013-05-30 LAB — CBC WITH DIFFERENTIAL/PLATELET
Eosinophils Absolute: 0.5 10*3/uL (ref 0.0–0.7)
Eosinophils Relative: 4 % (ref 0–5)
HCT: 32.6 % — ABNORMAL LOW (ref 36.0–46.0)
Hemoglobin: 10.8 g/dL — ABNORMAL LOW (ref 12.0–15.0)
Lymphocytes Relative: 19 % (ref 12–46)
Lymphs Abs: 2.2 10*3/uL (ref 0.7–4.0)
MCH: 33.9 pg (ref 26.0–34.0)
MCV: 102.2 fL — ABNORMAL HIGH (ref 78.0–100.0)
Monocytes Relative: 4 % (ref 3–12)
Platelets: 250 10*3/uL (ref 150–400)
RBC: 3.19 MIL/uL — ABNORMAL LOW (ref 3.87–5.11)
WBC: 11.6 10*3/uL — ABNORMAL HIGH (ref 4.0–10.5)

## 2013-05-30 LAB — COMPREHENSIVE METABOLIC PANEL
AST: 20 U/L (ref 0–37)
Albumin: 3.7 g/dL (ref 3.5–5.2)
BUN: 12 mg/dL (ref 6–23)
Calcium: 9.8 mg/dL (ref 8.4–10.5)
Chloride: 104 mEq/L (ref 96–112)
Creatinine, Ser: 1.01 mg/dL (ref 0.50–1.10)
Total Bilirubin: 0.4 mg/dL (ref 0.3–1.2)
Total Protein: 6.2 g/dL (ref 6.0–8.3)

## 2013-05-30 LAB — URINE MICROSCOPIC-ADD ON

## 2013-05-30 LAB — TROPONIN I: Troponin I: <0.3 ng/mL (ref <0.30)

## 2013-05-30 MED ORDER — LORAZEPAM 2 MG/ML IJ SOLN
0.5000 mg | Freq: Once | INTRAMUSCULAR | Status: AC
  Administered 2013-05-30: 0.5 mg via INTRAVENOUS

## 2013-05-30 MED ORDER — LORAZEPAM 2 MG/ML IJ SOLN
0.5000 mg | Freq: Once | INTRAMUSCULAR | Status: DC
  Filled 2013-05-30: qty 1

## 2013-05-30 MED ORDER — ONDANSETRON HCL 4 MG/2ML IJ SOLN
4.0000 mg | Freq: Once | INTRAMUSCULAR | Status: DC
  Filled 2013-05-30: qty 2

## 2013-05-30 NOTE — ED Notes (Signed)
Pt aware of the need for a urine sample. 

## 2013-05-30 NOTE — ED Provider Notes (Signed)
History    CSN: [id number] Arrival date & time 05/30/13  1208  First MD Initiated Contact with Patient 05/30/13 1221     Chief Complaint  Patient presents with  . Nausea   (Consider location/radiation/quality/duration/timing/severity/associated sxs/prior Treatment) HPI Comments: 79 yo female with AAA repair, copd presents with facial tightness and nausea recently.  Pt has had recurrent facial muscle tightness for years for which she has had botox and takes baclofen, recent increased frequency in episodes but identical to previous and has fup for them.  Pt has had recurrent nausea for a few weeks, this episode started this morning.  No other sxs.  No bleeding or vomiting.  No HA, cp, abd pain or new concerns.    The history is provided by the patient and a friend.   Past Medical History  Diagnosis Date  . Depression   . Ejection fraction     EF 40%, echo, November, 2012  / in improved, EF 60%, echo, December, 2012  . Contrast media allergy     Patient feels poorly with contrast  . Status post AAA (abdominal aortic aneurysm) repair     Surgical repair, Dr. Hart Rochester, December 27, 2011  . Pre-syncope     vasovagal w/ heart block  . Parotid mass     has refused further eval 10/2011  . GERD (gastroesophageal reflux disease)   . Anxiety   . Facial tic     L sided spasms - Botox trial summer 2013, ?effective  . Macular degeneration   . AAA (abdominal aortic aneurysm)     s/p repair 12/2011  . Congestive heart failure     NL EF 10/2011 echo   . Diabetes mellitus type II, controlled   . Hypothyroidism   . Sinus bradycardia 09/19/2011    Occurring simultaneously with complete heart block   . Hypertension   . COPD (chronic obstructive pulmonary disease)   . Abdominal spasms     hemifacial-treated w/ botox xeomin -Dr Terrace Arabia  . Confusion 11/02/12    hospitalized for 10 days,amnestic fo rthe stay and did not recognize her home when husband brought her home.  . Sinus disorder tested  11/03/11    syndrome  . Syncope and collapse    Past Surgical History  Procedure Laterality Date  . Cholecystectomy    . Knee cartilage surgery      left  . Sinus surgery with instatrak    . Appendectomy    . Oophorectomy      ovarian cyst  . Cataract extraction      bilateral  . Cardiac catheterization  11/04/11  . Bladder repair    . US echocardiography  10/14/11  . Abdominal aortic aneurysm repair  12/27/2011    Procedure: ANEURYSM ABDOMINAL AORTIC REPAIR;  Surgeon: Josephina Gip, MD;  Location: [facility] OR;  Service: Vascular;  Laterality: N/A;  Resection and Grafting Abdominal Aortic Aneurysm , Aorta Bi Iliac.  Marland Kitchen Pacemaker insertion  11/12   Family History  Problem Relation Age of Onset  . Esophageal cancer Father   . Cancer Father   . Breast cancer Sister   . Cancer Sister   . Colon cancer Sister   . Heart disease Sister   . Heart disease Mother   . Heart disease Son    History  Substance Use Topics  . Smoking status: Former Smoker -- 1.00 packs/day for 30 years    Types: Cigarettes    Quit date: 08/31/2011  . Smokeless tobacco: Never Used  .  Alcohol Use: No   OB History   Grav Para Term Preterm Abortions TAB SAB Ect Mult Living                 Review of Systems  Constitutional: Positive for appetite change. Negative for fever and chills.  HENT: Negative for neck pain and neck stiffness.   Eyes: Positive for visual disturbance (chronic MD).  Respiratory: Negative for shortness of breath.   Cardiovascular: Negative for chest pain.  Gastrointestinal: Positive for nausea. Negative for vomiting and abdominal pain.  Genitourinary: Negative for dysuria and flank pain.  Musculoskeletal: Negative for back pain.  Skin: Negative for rash.  Neurological: Negative for light-headedness and headaches.    Allergies  Bactrim; Contrast media; Gadolinium derivatives; Iohexol; Penicillins; Sulfa antibiotics; Avelox; Ciprofloxacin; Delsym; and Hydromet  Home Medications    Current Outpatient Rx  Name  Route  Sig  Dispense  Refill  . albuterol (PROVENTIL HFA;VENTOLIN HFA) 108 (90 BASE) MCG/ACT inhaler   Inhalation   Inhale 2 puffs into the lungs every 6 (six) hours as needed for wheezing.   1 Inhaler   2   . ALPRAZolam (XANAX) 1 MG tablet   Oral   Take 1 tablet (1 mg total) by mouth 3 (three) times daily.   270 tablet   0   . aspirin EC 81 MG tablet   Oral   Take 81 mg by mouth every morning.          . baclofen (LIORESAL) 10 MG tablet   Oral   Take 5-10 mg by mouth 2 (two) times daily as needed (for muscle spasms).         . calcium carbonate (OS-CAL) 600 MG TABS   Oral   Take 600 mg by mouth every other day.          . cetirizine (ZYRTEC) 10 MG tablet   Oral   Take 1 tablet (10 mg total) by mouth daily.   30 tablet   11   . Cholecalciferol (VITAMIN D3) 5000 UNITS TABS   Oral   Take 1 tablet by mouth every other day.          . furosemide (LASIX) 20 MG tablet      TAKE 1 BY MOUTH DAILY         . guaiFENesin (MUCINEX) 600 MG 12 hr tablet   Oral   Take 600 mg by mouth daily.         . Ipratropium-Albuterol (COMBIVENT) 20-100 MCG/ACT AERS respimat   Inhalation   Inhale 1 puff into the lungs every 6 (six) hours as needed for wheezing or shortness of breath.   4 g   3   . levothyroxine (SYNTHROID, LEVOTHROID) 175 MCG tablet   Oral   Take 1 tablet (175 mcg total) by mouth daily.   30 tablet   0   . losartan-hydrochlorothiazide (HYZAAR) 50-12.5 MG per tablet      TAKE 1 BY MOUTH DAILY         . metFORMIN (GLUCOPHAGE-XR) 500 MG 24 hr tablet   Oral   Take 1 tablet (500 mg total) by mouth daily with breakfast.   90 tablet   2   . ondansetron (ZOFRAN) 4 MG tablet   Oral   Take 1 tablet (4 mg total) by mouth every 8 (eight) hours as needed for nausea.   20 tablet   1   . pantoprazole (PROTONIX) 40 MG tablet   Oral   Take  1 tablet (40 mg total) by mouth daily.   90 tablet   1   . potassium chloride SA  (K-DUR,KLOR-CON) 20 MEQ tablet   Oral   Take 1 tablet (20 mEq total) by mouth daily.   90 tablet   1   . sertraline (ZOLOFT) 50 MG tablet   Oral   Take 1 tablet (50 mg total) by mouth daily.   90 tablet   1    BP 148/66  Pulse 84  Temp(Src) 99.1 F (37.3 C) (Oral)  Resp 20  SpO2 95% Physical Exam  Nursing note and vitals reviewed. Constitutional: She is oriented to person, place, and time. She appears well-developed and well-nourished.  HENT:  Head: Normocephalic and atraumatic.  Eyes: EOM are normal. Pupils are equal, round, and reactive to light. Right eye exhibits no discharge. Left eye exhibits no discharge.  Neck: Normal range of motion. Neck supple. No tracheal deviation present.  Cardiovascular: Normal rate, regular rhythm and intact distal pulses.   Pulmonary/Chest: Effort normal and breath sounds normal.  Abdominal: Soft. She exhibits no distension. There is no tenderness. There is no guarding.  Musculoskeletal: She exhibits no edema.  Neurological: She is alert and oriented to person, place, and time. No cranial nerve deficit. Coordination normal.  Skin: Skin is warm. No rash noted.  Psychiatric: She has a normal mood and affect.    ED Course  Procedures (including critical care time)  Date: 05/30/2013  Rate: 77  Rhythm: normal sinus rhythm  QRS Axis: normal  Intervals: PR prolonged  ST/T Wave abnormalities: nonspecific ST changes  Conduction Disutrbances:first-degree A-V block   Narrative Interpretation:   Old EKG Reviewed: unchanged   Labs Reviewed  CBC WITH DIFFERENTIAL - Abnormal; Notable for the following:    WBC 11.6 (*)    RBC 3.19 (*)    Hemoglobin 10.8 (*)    HCT 32.6 (*)    MCV 102.2 (*)    Neutro Abs 8.5 (*)    All other components within normal limits  COMPREHENSIVE METABOLIC PANEL  TROPONIN I  URINALYSIS, ROUTINE W REFLEX MICROSCOPIC   No results found. No diagnosis found.  MDM  Well appearing.  Concerns seem to be acute on  chronic. Discussed broad differential for nausea.  Ativan for muscle facial spasms. Normal neuro exam. Screening work up done. Fup discussed.   No source of infection on exam. Well appearing on recheck. Dg Chest 2 View  05/30/2013   *RADIOLOGY REPORT*  Clinical Data: Diabetes, hypertension  CHEST - 2 VIEW  Comparison: Chest radiograph 05/27/2013  Findings: Stable enlarged heart silhouette.  No effusion, infiltrate, or pneumothorax.  There are small bilateral pleural effusions.  These are unchanged from prior.  IMPRESSION:  1.  No interval change. 2.  Hyper hyperinflated lungs. 3.  Cardiomegaly. 4.  Small pleural effusions.   Original Report Authenticated By: Genevive Bi, M.D.     Enid Skeens, MD 05/30/13 [phone]

## 2013-05-30 NOTE — ED Notes (Signed)
Patient states that she does not feel well. She doesn't feel any better than she did when she got here. States she is not nauseous.

## 2013-05-30 NOTE — ED Notes (Signed)
Pt reports nausea but denies vomiting.  Pt also reports spasms in her face.  Has hx of this, takes baclofen and botox for this.  Pt is A&Ox 4.  Pt is legally blind.

## 2013-05-31 ENCOUNTER — Other Ambulatory Visit: Payer: Self-pay | Admitting: *Deleted

## 2013-05-31 ENCOUNTER — Encounter: Payer: Self-pay | Admitting: Internal Medicine

## 2013-05-31 ENCOUNTER — Ambulatory Visit (INDEPENDENT_AMBULATORY_CARE_PROVIDER_SITE_OTHER): Payer: Medicare Other | Admitting: Internal Medicine

## 2013-05-31 VITALS — BP 130/70 | HR 92 | Temp 97.8°F | Wt 177.8 lb

## 2013-05-31 DIAGNOSIS — R110 Nausea: Secondary | ICD-10-CM | POA: Insufficient documentation

## 2013-05-31 DIAGNOSIS — M48062 Spinal stenosis, lumbar region with neurogenic claudication: Secondary | ICD-10-CM

## 2013-05-31 DIAGNOSIS — M545 Low back pain: Secondary | ICD-10-CM

## 2013-05-31 MED ORDER — CETIRIZINE HCL 10 MG PO TABS: 10.0000 mg | ORAL_TABLET | Freq: Every day | ORAL | Status: DC

## 2013-05-31 NOTE — Progress Notes (Signed)
Subjective:    Patient ID: Paula [name], female    DOB: [birth year], 79 y.o.   MRN: [id number]  HPI  Here for ER followup - see CC  Continued back and leg problems - see prior OV  Past Medical History  Diagnosis Date  . Depression   . Ejection fraction     EF 40%, echo, November, 2012  / in improved, EF 60%, echo, December, 2012  . Contrast media allergy     Patient feels poorly with contrast  . Status post AAA (abdominal aortic aneurysm) repair     Surgical repair, Dr. Hart Rochester, December 27, 2011  . Pre-syncope     vasovagal w/ heart block  . Parotid mass     has refused further eval 10/2011  . GERD (gastroesophageal reflux disease)   . Anxiety   . Facial tic     L sided spasms - Botox trial summer 2013, ?effective  . Macular degeneration   . AAA (abdominal aortic aneurysm)     s/p repair 12/2011  . Congestive heart failure     NL EF 10/2011 echo   . Diabetes mellitus type II, controlled   . Hypothyroidism   . Sinus bradycardia 09/19/2011    Occurring simultaneously with complete heart block   . Hypertension   . COPD (chronic obstructive pulmonary disease)   . Abdominal spasms     hemifacial-treated w/ botox xeomin -Dr Terrace Arabia    Review of Systems  HENT: Positive for hoarse voice.   Respiratory: Positive for cough (chronic) and shortness of breath (chronic).   Musculoskeletal: Positive for gait problem. Negative for myalgias and joint swelling.  Neurological: Positive for speech difficulty (chronic "drawing up of mouth", follows with neuro for same) and weakness. Negative for headaches.  Psychiatric/Behavioral: Negative for sleep disturbance and dysphoric mood.       Objective:   Physical Exam  BP 130/70  Pulse 92  Temp(Src) 97.8 F (36.6 C) (Oral)  Wt 177 lb 12.8 oz (80.65 kg)  BMI 29.59 kg/m2  SpO2 93% Wt Readings from Last 3 Encounters:  05/31/13 177 lb 12.8 oz (80.65 kg)  05/27/13 179 lb 12.8 oz (81.557 kg)  05/21/13 177 lb 9.6 oz (80.559 kg)   Gen: NAD,  nontoxic. hoarse without change. Spouse at side Lungs: CTA B - no wheeze or crackles; diminished air movement at bases CV: RRR, no edema BLE Abdomen: SNTND+BS Back: full range of motion of thoracic and lumbar spine. Non tender to palpation. DTR's are symmetrically intact. Sensation diminished distally in lower extremities. grossly normal strength to manual muscle testing. patient is not able to heel toe walk, uses RW and ambulates with antalgic gait. Psychiatric: tangential    Lab Results  Component Value Date   WBC 11.6* 05/30/2013   HGB 10.8* 05/30/2013   HCT 32.6* 05/30/2013   PLT 250 05/30/2013   GLUCOSE 114* 05/30/2013   CHOL 149 02/10/2013   TRIG 218.0* 02/10/2013   HDL 30.10* 02/10/2013   LDLDIRECT 90.6 02/10/2013   LDLCALC 80 11/04/2011   ALT 19 05/30/2013   AST 20 05/30/2013   NA 140 05/30/2013   K 3.8 05/30/2013   CL 104 05/30/2013   CREATININE 1.01 05/30/2013   BUN 12 05/30/2013   CO2 27 05/30/2013   TSH 1.797 04/08/2013   INR 1.06 08/24/2012   HGBA1C 6.1* 04/08/2013       Assessment & Plan:   See problem list. Medications and labs reviewed today.  Nausea, chronic and recurrent. ER  evaluation for same this week and reviewed. Negative workup for etiology. No current symptoms. Continue medications for symptoms as needed  Difficulty walking/gait and balance disorder - suspect progressive lumbar spinal stenosis - ?neurogenic claudication - min improved with pred taper Check MRI lumbar spine now refer to ortho spine and re-request HHPT for balance/gait training

## 2013-05-31 NOTE — Patient Instructions (Signed)
It was good to see you today. We have reviewed your prior records including labs and tests today Test(s) ordered today - MRI of your back - we will call you with this appointment  Your results will be released to MyChart (or called to you) after review, usually within 72hours after test completion. If any changes need to be made, you will be notified at that same time. Referral to back spine specialist and home health physical therapy to assist you with your leg and back pain, also balance and gait training - our office will call regarding this appointment once made Followup as scheduled, call sooner if problems  Back Exercises Back exercises help treat and prevent back injuries. The goal is to increase your strength in your belly (abdominal) and back muscles. These exercises can also help with flexibility. Start these exercises when told by your doctor. HOME CARE Back exercises include: Pelvic Tilt.  Lie on your back with your knees bent. Tilt your pelvis until the lower part of your back is against the floor. Hold this position 5 to 10 sec. Repeat this exercise 5 to 10 times. Knee to Chest.  Pull 1 knee up against your chest and hold for 20 to 30 seconds. Repeat this with the other knee. This may be done with the other leg straight or bent, whichever feels better. Then, pull both knees up against your chest. Sit-Ups or Curl-Ups.  Bend your knees 90 degrees. Start with tilting your pelvis, and do a partial, slow sit-up. Only lift your upper half 30 to 45 degrees off the floor. Take at least 2 to 3 seonds for each sit-up. Do not do sit-ups with your knees out straight. If partial sit-ups are difficult, simply do the above but with only tightening your belly (abdominal) muscles and holding it as told. Hip-Lift.  Lie on your back with your knees flexed 90 degrees. Push down with your feet and shoulders as you raise your hips 2 inches off the floor. Hold for 10 seconds, repeat 5 to 10 times. Back  Arches.  Lie on your stomach. Prop yourself up on bent elbows. Slowly press on your hands, causing an arch in your low back. Repeat 3 to 5 times. Shoulder-Lifts.  Lie face down with arms beside your body. Keep hips and belly pressed to floor as you slowly lift your head and shoulders off the floor. Do not overdo your exercises. Be careful in the beginning. Exercises may cause you some mild back discomfort. If the pain lasts for more than 15 minutes, stop the exercises until you see your doctor. Improvement with exercise for back problems is slow.  Document Released: 11/30/2010 Document Revised: 01/20/2012 Document Reviewed: 08/29/2011 [facility] Patient Information 2014 [place], [place].

## 2013-06-09 ENCOUNTER — Ambulatory Visit
Admission: RE | Admit: 2013-06-09 | Discharge: 2013-06-09 | Disposition: A | Discharge status: Home / Self Care | Payer: Medicare Other | Source: Ambulatory Visit | Attending: Internal Medicine | Admitting: Internal Medicine

## 2013-06-09 DIAGNOSIS — M48062 Spinal stenosis, lumbar region with neurogenic claudication: Secondary | ICD-10-CM

## 2013-06-09 DIAGNOSIS — M545 Low back pain: Secondary | ICD-10-CM

## 2013-06-09 NOTE — Progress Notes (Signed)
Quick Note:  Please call pt- her MRI does NOT show any ruptured disc -  Arthritis changes only causing her back pain Continue PT and call if worse/unimporved for refer to specialist if needed ______

## 2013-06-18 ENCOUNTER — Encounter: Payer: Self-pay | Admitting: Internal Medicine

## 2013-06-18 ENCOUNTER — Ambulatory Visit (INDEPENDENT_AMBULATORY_CARE_PROVIDER_SITE_OTHER): Payer: Medicare Other | Admitting: Internal Medicine

## 2013-06-18 VITALS — BP 130/82 | HR 99 | Temp 99.1°F | Wt 179.8 lb

## 2013-06-18 DIAGNOSIS — IMO0002 Reserved for concepts with insufficient information to code with codable children: Secondary | ICD-10-CM

## 2013-06-18 DIAGNOSIS — L02818 Cutaneous abscess of other sites: Secondary | ICD-10-CM

## 2013-06-18 MED ORDER — CEPHALEXIN 500 MG PO CAPS: 500.0000 mg | ORAL_CAPSULE | Freq: Four times a day (QID) | ORAL | Status: DC

## 2013-06-18 NOTE — Progress Notes (Signed)
Subjective:    Patient ID: Paula [name], female    DOB: [birth year], 79 y.o.   MRN: [id number]  HPI  Pt presents to the clinic today with c/o painful knots at the base of her scalp. This started yesterday. She has also been running low grade fevers. She has not fallen and hit her head. She has put some seabreeze on it without relief. She has never had anything like this before.  Review of Systems      Past Medical History  Diagnosis Date  . Depression   . Ejection fraction     EF 40%, echo, November, 2012  / in improved, EF 60%, echo, December, 2012  . Contrast media allergy     Patient feels poorly with contrast  . Status post AAA (abdominal aortic aneurysm) repair     Surgical repair, Dr. Hart Rochester, December 27, 2011  . Pre-syncope     vasovagal w/ heart block  . Parotid mass     has refused further eval 10/2011  . GERD (gastroesophageal reflux disease)   . Anxiety   . Facial tic     L sided spasms - Botox trial summer 2013, ?effective  . Macular degeneration   . AAA (abdominal aortic aneurysm)     s/p repair 12/2011  . Congestive heart failure     NL EF 10/2011 echo   . Diabetes mellitus type II, controlled   . Hypothyroidism   . Sinus bradycardia 09/19/2011    Occurring simultaneously with complete heart block   . Hypertension   . COPD (chronic obstructive pulmonary disease)   . Abdominal spasms     hemifacial-treated w/ botox xeomin -Dr Terrace Arabia    Current Outpatient Prescriptions  Medication Sig Dispense Refill  . albuterol (PROVENTIL HFA;VENTOLIN HFA) 108 (90 BASE) MCG/ACT inhaler Inhale 2 puffs into the lungs every 6 (six) hours as needed for wheezing.  1 Inhaler  2  . ALPRAZolam (XANAX) 1 MG tablet Take 1 tablet (1 mg total) by mouth 3 (three) times daily.  270 tablet  0  . aspirin EC 81 MG tablet Take 81 mg by mouth every morning.       . baclofen (LIORESAL) 10 MG tablet Take 5-10 mg by mouth 2 (two) times daily as needed (for muscle spasms).      . calcium carbonate  (OS-CAL) 600 MG TABS Take 600 mg by mouth every other day.       . cetirizine (ZYRTEC) 10 MG tablet Take 1 tablet (10 mg total) by mouth daily.  90 tablet  1  . Cholecalciferol (VITAMIN D3) 5000 UNITS TABS Take 1 tablet by mouth every other day.       . furosemide (LASIX) 20 MG tablet TAKE 1 BY MOUTH DAILY      . guaiFENesin (MUCINEX) 600 MG 12 hr tablet Take 600 mg by mouth daily.      . Ipratropium-Albuterol (COMBIVENT) 20-100 MCG/ACT AERS respimat Inhale 1 puff into the lungs every 6 (six) hours as needed for wheezing or shortness of breath.  4 g  3  . levothyroxine (SYNTHROID, LEVOTHROID) 175 MCG tablet Take 1 tablet (175 mcg total) by mouth daily.  30 tablet  0  . losartan-hydrochlorothiazide (HYZAAR) 50-12.5 MG per tablet TAKE 1 BY MOUTH DAILY      . metFORMIN (GLUCOPHAGE-XR) 500 MG 24 hr tablet Take 1 tablet (500 mg total) by mouth daily with breakfast.  90 tablet  2  . ondansetron (ZOFRAN) 4 MG tablet Take 1  tablet (4 mg total) by mouth every 8 (eight) hours as needed for nausea.  20 tablet  1  . pantoprazole (PROTONIX) 40 MG tablet Take 1 tablet (40 mg total) by mouth daily.  90 tablet  1  . potassium chloride SA (K-DUR,KLOR-CON) 20 MEQ tablet Take 1 tablet (20 mEq total) by mouth daily.  90 tablet  1  . sertraline (ZOLOFT) 50 MG tablet Take 1 tablet (50 mg total) by mouth daily.  90 tablet  1   No current facility-administered medications for this visit.    Allergies  Allergen Reactions  . Bactrim (Sulfamethoxazole-Tmp Ds)   . Contrast Media (Iodinated Diagnostic Agents) Anaphylaxis  . Gadolinium Derivatives   . Iohexol Anaphylaxis, Shortness Of Breath and Swelling     Desc: PT STATES SHE HAD A SEVERE REACTION TO IV CONRAST WITH THROAT SWELLING AND SOB. SHE WAS ADMITTED TO THE HOSPITAL. SHE HAS NEVER HAD CONTRAST AGAIN.   Marland Kitchen Penicillins Rash  . Sulfa Antibiotics Anaphylaxis  . Avelox (Moxifloxacin Hcl In Nacl) Rash  . Ciprofloxacin Itching  . Delsym (Dextromethorphan) Other (See  Comments)    Hallucination  . Hydromet (Hydrocodone-Homatropine) Other (See Comments)    Pt states med make her hallucinate    Family History  Problem Relation Age of Onset  . Esophageal cancer Father   . Cancer Father   . Breast cancer Sister   . Cancer Sister   . Colon cancer Sister   . Heart disease Sister   . Heart disease Mother   . Heart disease Son     History   Social History  . Marital Status: Married    Spouse Name: N/A    Number of Children: 4  . Years of Education: N/A   Occupational History  . retired Other    worked for 25 years, hairdresser   Social History Main Topics  . Smoking status: Former Smoker -- 1.00 packs/day for 30 years    Types: Cigarettes    Quit date: 08/31/2011  . Smokeless tobacco: Never Used  . Alcohol Use: No  . Drug Use: No  . Sexually Active: Not on file   Other Topics Concern  . Not on file   Social History Narrative  . No narrative on file     Constitutional: Denies fever, malaise, fatigue, headache or abrupt weight changes.  Respiratory: Denies difficulty breathing, shortness of breath, cough or sputum production.   Cardiovascular: Denies chest pain, chest tightness, palpitations or swelling in the hands or feet.  Skin: Pt reports knots on the base of her scalp. Denies redness, rashes, lesions or ulcercations.  Neurological: Denies dizziness, difficulty with memory, difficulty with speech or problems with balance and coordination.   No other specific complaints in a complete review of systems (except as listed in HPI above).  Objective:   Physical Exam  BP 130/82  Pulse 99  Temp(Src) 99.1 F (37.3 C) (Oral)  Wt 179 lb 12.8 oz (81.557 kg)  BMI 29.92 kg/m2  SpO2 91% Wt Readings from Last 3 Encounters:  06/18/13 179 lb 12.8 oz (81.557 kg)  05/31/13 177 lb 12.8 oz (80.65 kg)  05/27/13 179 lb 12.8 oz (81.557 kg)    General: Appears her stated age, well developed, well nourished in NAD. Skin: Warm, dry and  intact. Small abscess, draining surrounded by area of cellulitis. Cardiovascular: Normal rate and rhythm. S1,S2 noted.  No murmur, rubs or gallops noted. No JVD or BLE edema. No carotid bruits noted. Pulmonary/Chest: Normal effort and  positive vesicular breath sounds. No respiratory distress. No wheezes, rales or ronchi noted.  Neurological: Alert and oriented. Cranial nerves II-XII intact. Coordination normal. +DTRs bilaterally.   BMET    Component Value Date/Time   NA 140 05/30/2013 1330   K 3.8 05/30/2013 1330   CL 104 05/30/2013 1330   CO2 27 05/30/2013 1330   GLUCOSE 114* 05/30/2013 1330   BUN 12 05/30/2013 1330   CREATININE 1.01 05/30/2013 1330   CREATININE 0.80 12/23/2011 0824   CALCIUM 9.8 05/30/2013 1330   GFRNONAA 50* 05/30/2013 1330   GFRAA 58* 05/30/2013 1330    Lipid Panel     Component Value Date/Time   CHOL 149 02/10/2013 0938   TRIG 218.0* 02/10/2013 0938   HDL 30.10* 02/10/2013 0938   CHOLHDL 5 02/10/2013 0938   VLDL 43.6* 02/10/2013 0938   LDLCALC 80 11/04/2011 0657    CBC    Component Value Date/Time   WBC 11.6* 05/30/2013 1330   RBC 3.19* 05/30/2013 1330   HGB 10.8* 05/30/2013 1330   HCT 32.6* 05/30/2013 1330   PLT 250 05/30/2013 1330   MCV 102.2* 05/30/2013 1330   MCH 33.9 05/30/2013 1330   MCHC 33.1 05/30/2013 1330   RDW 14.2 05/30/2013 1330   LYMPHSABS 2.2 05/30/2013 1330   MONOABS 0.5 05/30/2013 1330   EOSABS 0.5 05/30/2013 1330   BASOSABS 0.1 05/30/2013 1330    Hgb A1C Lab Results  Component Value Date   HGBA1C 6.1* 04/08/2013         Assessment & Plan:   Cellulitis and abscess of scalp, new onset:  Wash with soap and water Warm compresses to allow it to drain eRx for Keflex QID x 5 days  RTC as needed or if knot is more red, tender, swollen

## 2013-06-18 NOTE — Patient Instructions (Addendum)
Abscess An abscess is an infected area that contains a collection of pus and debris.It can occur in almost any part of the body. An abscess is also known as a furuncle or boil. CAUSES  An abscess occurs when tissue gets infected. This can occur from blockage of oil or sweat glands, infection of hair follicles, or a minor injury to the skin. As the body tries to fight the infection, pus collects in the area and creates pressure under the skin. This pressure causes pain. People with weakened immune systems have difficulty fighting infections and get certain abscesses more often.  SYMPTOMS Usually an abscess develops on the skin and becomes a painful mass that is red, warm, and tender. If the abscess forms under the skin, you may feel a moveable soft area under the skin. Some abscesses break open (rupture) on their own, but most will continue to get worse without care. The infection can spread deeper into the body and eventually into the bloodstream, causing you to feel ill.  DIAGNOSIS  Your caregiver will take your medical history and perform a physical exam. A sample of fluid may also be taken from the abscess to determine what is causing your infection. TREATMENT  Your caregiver may prescribe antibiotic medicines to fight the infection. However, taking antibiotics alone usually does not cure an abscess. Your caregiver may need to make a small cut (incision) in the abscess to drain the pus. In some cases, gauze is packed into the abscess to reduce pain and to continue draining the area. HOME CARE INSTRUCTIONS   Only take over-the-counter or prescription medicines for pain, discomfort, or fever as directed by your caregiver.  If you were prescribed antibiotics, take them as directed. Finish them even if you start to feel better.  If gauze is used, follow your caregiver's directions for changing the gauze.  To avoid spreading the infection:  Keep your draining abscess covered with a  bandage.  Wash your hands well.  Do not share personal care items, towels, or whirlpools with others.  Avoid skin contact with others.  Keep your skin and clothes clean around the abscess.  Keep all follow-up appointments as directed by your caregiver. SEEK MEDICAL CARE IF:   You have increased pain, swelling, redness, fluid drainage, or bleeding.  You have muscle aches, chills, or a general ill feeling.  You have a fever. MAKE SURE YOU:   Understand these instructions.  Will watch your condition.  Will get help right away if you are not doing well or get worse. Document Released: 08/07/2005 Document Revised: 04/28/2012 Document Reviewed: 01/10/2012 [employer] Patient Information 2014 [employer].  

## 2013-06-19 ENCOUNTER — Emergency Department (HOSPITAL_COMMUNITY)
Admission: EM | Admit: 2013-06-19 | Discharge: 2013-06-19 | Disposition: A | Discharge status: Home / Self Care | Payer: Medicare Other | Attending: Emergency Medicine | Admitting: Emergency Medicine

## 2013-06-19 ENCOUNTER — Encounter (HOSPITAL_COMMUNITY): Payer: Self-pay | Admitting: Emergency Medicine

## 2013-06-19 DIAGNOSIS — F329 Major depressive disorder, single episode, unspecified: Secondary | ICD-10-CM | POA: Insufficient documentation

## 2013-06-19 DIAGNOSIS — Z862 Personal history of diseases of the blood and blood-forming organs and certain disorders involving the immune mechanism: Secondary | ICD-10-CM | POA: Insufficient documentation

## 2013-06-19 DIAGNOSIS — F411 Generalized anxiety disorder: Secondary | ICD-10-CM | POA: Insufficient documentation

## 2013-06-19 DIAGNOSIS — J4489 Other specified chronic obstructive pulmonary disease: Secondary | ICD-10-CM | POA: Insufficient documentation

## 2013-06-19 DIAGNOSIS — Z8679 Personal history of other diseases of the circulatory system: Secondary | ICD-10-CM | POA: Insufficient documentation

## 2013-06-19 DIAGNOSIS — R52 Pain, unspecified: Secondary | ICD-10-CM | POA: Insufficient documentation

## 2013-06-19 DIAGNOSIS — L739 Follicular disorder, unspecified: Secondary | ICD-10-CM

## 2013-06-19 DIAGNOSIS — Z9861 Coronary angioplasty status: Secondary | ICD-10-CM | POA: Insufficient documentation

## 2013-06-19 DIAGNOSIS — E119 Type 2 diabetes mellitus without complications: Secondary | ICD-10-CM | POA: Insufficient documentation

## 2013-06-19 DIAGNOSIS — Z7982 Long term (current) use of aspirin: Secondary | ICD-10-CM | POA: Insufficient documentation

## 2013-06-19 DIAGNOSIS — J449 Chronic obstructive pulmonary disease, unspecified: Secondary | ICD-10-CM | POA: Insufficient documentation

## 2013-06-19 DIAGNOSIS — Z8669 Personal history of other diseases of the nervous system and sense organs: Secondary | ICD-10-CM | POA: Insufficient documentation

## 2013-06-19 DIAGNOSIS — Z87891 Personal history of nicotine dependence: Secondary | ICD-10-CM | POA: Insufficient documentation

## 2013-06-19 DIAGNOSIS — IMO0001 Reserved for inherently not codable concepts without codable children: Secondary | ICD-10-CM | POA: Insufficient documentation

## 2013-06-19 DIAGNOSIS — E039 Hypothyroidism, unspecified: Secondary | ICD-10-CM | POA: Insufficient documentation

## 2013-06-19 DIAGNOSIS — M542 Cervicalgia: Secondary | ICD-10-CM | POA: Insufficient documentation

## 2013-06-19 DIAGNOSIS — R5383 Other fatigue: Secondary | ICD-10-CM | POA: Insufficient documentation

## 2013-06-19 DIAGNOSIS — Z79899 Other long term (current) drug therapy: Secondary | ICD-10-CM | POA: Insufficient documentation

## 2013-06-19 DIAGNOSIS — F3289 Other specified depressive episodes: Secondary | ICD-10-CM | POA: Insufficient documentation

## 2013-06-19 DIAGNOSIS — I10 Essential (primary) hypertension: Secondary | ICD-10-CM | POA: Insufficient documentation

## 2013-06-19 DIAGNOSIS — L738 Other specified follicular disorders: Secondary | ICD-10-CM | POA: Insufficient documentation

## 2013-06-19 DIAGNOSIS — R509 Fever, unspecified: Secondary | ICD-10-CM | POA: Insufficient documentation

## 2013-06-19 DIAGNOSIS — R110 Nausea: Secondary | ICD-10-CM | POA: Insufficient documentation

## 2013-06-19 DIAGNOSIS — R21 Rash and other nonspecific skin eruption: Secondary | ICD-10-CM | POA: Insufficient documentation

## 2013-06-19 DIAGNOSIS — Z8639 Personal history of other endocrine, nutritional and metabolic disease: Secondary | ICD-10-CM | POA: Insufficient documentation

## 2013-06-19 DIAGNOSIS — Z950 Presence of cardiac pacemaker: Secondary | ICD-10-CM | POA: Insufficient documentation

## 2013-06-19 DIAGNOSIS — Z880 Allergy status to penicillin: Secondary | ICD-10-CM | POA: Insufficient documentation

## 2013-06-19 DIAGNOSIS — R5381 Other malaise: Secondary | ICD-10-CM | POA: Insufficient documentation

## 2013-06-19 DIAGNOSIS — I509 Heart failure, unspecified: Secondary | ICD-10-CM | POA: Insufficient documentation

## 2013-06-19 LAB — BASIC METABOLIC PANEL
Chloride: 99 mEq/L (ref 96–112)
GFR calc Af Amer: 54 mL/min — ABNORMAL LOW (ref >90)
GFR calc non Af Amer: 47 mL/min — ABNORMAL LOW (ref >90)
Potassium: 3.7 mEq/L (ref 3.5–5.1)

## 2013-06-19 LAB — CBC
MCHC: 32.9 g/dL (ref 30.0–36.0)
Platelets: 323 10*3/uL (ref 150–400)
RDW: 14.4 % (ref 11.5–15.5)
WBC: 12.7 10*3/uL — ABNORMAL HIGH (ref 4.0–10.5)

## 2013-06-19 MED ORDER — ONDANSETRON 8 MG PO TBDP
8.0000 mg | ORAL_TABLET | Freq: Once | ORAL | Status: AC
  Administered 2013-06-19: 8 mg via ORAL
  Filled 2013-06-19: qty 1

## 2013-06-19 MED ORDER — DOXYCYCLINE HYCLATE 100 MG PO CAPS: 100.0000 mg | ORAL_CAPSULE | Freq: Two times a day (BID) | ORAL | Status: DC

## 2013-06-19 MED ORDER — TRAMADOL HCL 50 MG PO TABS: 50.0000 mg | ORAL_TABLET | Freq: Three times a day (TID) | ORAL | Status: DC | PRN

## 2013-06-19 MED ORDER — TRAMADOL HCL 50 MG PO TABS
50.0000 mg | ORAL_TABLET | Freq: Once | ORAL | Status: AC
  Administered 2013-06-19: 50 mg via ORAL
  Filled 2013-06-19: qty 1

## 2013-06-19 NOTE — ED Notes (Signed)
Pt states that she has an abscess on the back of her neck x 2 days.  States that she went to the doctor and was given abx but today feels nauseated and "just sick".

## 2013-06-19 NOTE — ED Provider Notes (Signed)
Medical screening examination/treatment/procedure(s) were conducted as a shared visit with non-physician practitioner(s) and myself.  I personally evaluated the patient during the encounter  Patient with folliculitis the base of her neck without evidence of abscess. Will change antibiotics and patient to be discharged home.  Toy Baker, MD 06/19/13 [phone]

## 2013-06-19 NOTE — ED Provider Notes (Signed)
CSN: [id number]     Arrival date & time 06/19/13  1603 History     First MD Initiated Contact with Patient 06/19/13 1645     Chief Complaint  Patient presents with  . Abscess  . Nausea   (Consider location/radiation/quality/duration/timing/severity/associated sxs/prior Treatment) Patient is a 79 y.o. female presenting with abscess. The history is provided by the patient.  Abscess Location:  Head/neck Abscess quality: painful   Red streaking: no   Associated symptoms: fever and nausea   Associated symptoms: no vomiting   Associated symptoms comment:  She complains of painful sores in the posterior scalp for several days with minimal drainage. She was seen by her physician yesterday who put her on Keflex. She is here for evaluation after feeling progressively sicker, with nausea, fatigue, generalized muscle aching and low grade fever.    Past Medical History  Diagnosis Date  . Depression   . Ejection fraction     EF 40%, echo, November, 2012  / in improved, EF 60%, echo, December, 2012  . Contrast media allergy     Patient feels poorly with contrast  . Status post AAA (abdominal aortic aneurysm) repair     Surgical repair, Dr. Hart Rochester, December 27, 2011  . Pre-syncope     vasovagal w/ heart block  . Parotid mass     has refused further eval 10/2011  . GERD (gastroesophageal reflux disease)   . Anxiety   . Facial tic     L sided spasms - Botox trial summer 2013, ?effective  . Macular degeneration   . AAA (abdominal aortic aneurysm)     s/p repair 12/2011  . Congestive heart failure     NL EF 10/2011 echo   . Diabetes mellitus type II, controlled   . Hypothyroidism   . Sinus bradycardia 09/19/2011    Occurring simultaneously with complete heart block   . Hypertension   . COPD (chronic obstructive pulmonary disease)   . Abdominal spasms     hemifacial-treated w/ botox xeomin -Dr Terrace Arabia   Past Surgical History  Procedure Laterality Date  . Cholecystectomy    . Knee  cartilage surgery      left  . Sinus surgery with instatrak    . Appendectomy    . Oophorectomy      ovarian cyst  . Cataract extraction      bilateral  . Cardiac catheterization  11/04/11  . Bladder repair    . US echocardiography  10/14/11  . Abdominal aortic aneurysm repair  12/27/2011    Procedure: ANEURYSM ABDOMINAL AORTIC REPAIR;  Surgeon: Josephina Gip, MD;  Location: [facility];  Service: Vascular;  Laterality: N/A;  Resection and Grafting Abdominal Aortic Aneurysm , Aorta Bi Iliac.  Marland Kitchen Pacemaker insertion  11/12   Family History  Problem Relation Age of Onset  . Esophageal cancer Father   . Cancer Father   . Breast cancer Sister   . Cancer Sister   . Colon cancer Sister   . Heart disease Sister   . Heart disease Mother   . Heart disease Son    History  Substance Use Topics  . Smoking status: Former Smoker -- 1.00 packs/day for 30 years    Types: Cigarettes    Quit date: 08/31/2011  . Smokeless tobacco: Never Used  . Alcohol Use: No   OB History   Grav Para Term Preterm Abortions TAB SAB Ect Mult Living  Review of Systems  Constitutional: Positive for fever. Negative for chills.  HENT: Positive for neck pain.   Gastrointestinal: Positive for nausea. Negative for vomiting.  Musculoskeletal: Positive for myalgias.  Skin: Positive for rash.  Neurological: Positive for weakness.  Psychiatric/Behavioral: Negative for confusion.    Allergies  Bactrim; Contrast media; Gadolinium derivatives; Iohexol; Penicillins; Sulfa antibiotics; Avelox; Ciprofloxacin; Delsym; and Hydromet  Home Medications   Current Outpatient Rx  Name  Route  Sig  Dispense  Refill  . albuterol (PROVENTIL HFA;VENTOLIN HFA) 108 (90 BASE) MCG/ACT inhaler   Inhalation   Inhale 2 puffs into the lungs every 6 (six) hours as needed for wheezing.   1 Inhaler   2   . ALPRAZolam (XANAX) 1 MG tablet   Oral   Take 1 tablet (1 mg total) by mouth 3 (three) times daily.   270 tablet   0    . aspirin EC 81 MG tablet   Oral   Take 81 mg by mouth every morning.          . baclofen (LIORESAL) 10 MG tablet   Oral   Take 5-10 mg by mouth 2 (two) times daily as needed (for muscle spasms).         . calcium carbonate (OS-CAL) 600 MG TABS   Oral   Take 600 mg by mouth every other day.          . cephALEXin (KEFLEX) 500 MG capsule   Oral   Take 1 capsule (500 mg total) by mouth 4 (four) times daily.   20 capsule   0   . cetirizine (ZYRTEC) 10 MG tablet   Oral   Take 1 tablet (10 mg total) by mouth daily.   90 tablet   1   . Cholecalciferol (VITAMIN D3) 5000 UNITS TABS   Oral   Take 1 tablet by mouth every other day.          . furosemide (LASIX) 20 MG tablet      TAKE 1 BY MOUTH DAILY         . guaiFENesin (MUCINEX) 600 MG 12 hr tablet   Oral   Take 600 mg by mouth daily.         . Ipratropium-Albuterol (COMBIVENT) 20-100 MCG/ACT AERS respimat   Inhalation   Inhale 1 puff into the lungs every 6 (six) hours as needed for wheezing or shortness of breath.   4 g   3   . levothyroxine (SYNTHROID, LEVOTHROID) 175 MCG tablet   Oral   Take 1 tablet (175 mcg total) by mouth daily.   30 tablet   0   . losartan-hydrochlorothiazide (HYZAAR) 50-12.5 MG per tablet      TAKE 1 BY MOUTH DAILY         . metFORMIN (GLUCOPHAGE-XR) 500 MG 24 hr tablet   Oral   Take 1 tablet (500 mg total) by mouth daily with breakfast.   90 tablet   2   . ondansetron (ZOFRAN) 4 MG tablet   Oral   Take 1 tablet (4 mg total) by mouth every 8 (eight) hours as needed for nausea.   20 tablet   1   . pantoprazole (PROTONIX) 40 MG tablet   Oral   Take 1 tablet (40 mg total) by mouth daily.   90 tablet   1   . potassium chloride SA (K-DUR,KLOR-CON) 20 MEQ tablet   Oral   Take 1 tablet (20 mEq total) by mouth daily.  90 tablet   1   . sertraline (ZOLOFT) 50 MG tablet   Oral   Take 1 tablet (50 mg total) by mouth daily.   90 tablet   1    BP 122/65  Pulse  105  Temp(Src) 98.8 F (37.1 C) (Oral)  Resp 18  SpO2 93% Physical Exam  Constitutional: She is oriented to person, place, and time. She appears well-developed and well-nourished. No distress.  HENT:  Head: Normocephalic.  Neck: Normal range of motion. Neck supple.  Cardiovascular: Normal rate and regular rhythm.   Pulmonary/Chest: Effort normal and breath sounds normal.  Abdominal: Soft. Bowel sounds are normal. There is no tenderness. There is no rebound and no guarding.  Musculoskeletal: Normal range of motion.  Lymphadenopathy:    She has no cervical adenopathy.  Neurological: She is alert and oriented to person, place, and time.  Skin: Skin is warm and dry. No rash noted.  Multiple small tender bumps posterior scalp at lower hair line. No active drainage. No surrounding redness.   Psychiatric: She has a normal mood and affect.    ED Course   Procedures (including critical care time)  Labs Reviewed  CBC - Abnormal; Notable for the following:    WBC 12.7 (*)    RBC 3.48 (*)    Hemoglobin 11.8 (*)    HCT 35.9 (*)    MCV 103.2 (*)    All other components within normal limits  BASIC METABOLIC PANEL   Results for orders placed during the hospital encounter of 06/19/13  CBC      Result Value Range   WBC 12.7 (*) 4.0 - 10.5 K/uL   RBC 3.48 (*) 3.87 - 5.11 MIL/uL   Hemoglobin 11.8 (*) 12.0 - 15.0 g/dL   HCT 16.1 (*) 09.6 - 04.5 %   MCV 103.2 (*) 78.0 - 100.0 fL   MCH 33.9  26.0 - 34.0 pg   MCHC 32.9  30.0 - 36.0 g/dL   RDW 40.9  81.1 - 91.4 %   Platelets 323  150 - 400 K/uL  BASIC METABOLIC PANEL      Result Value Range   Sodium 138  135 - 145 mEq/L   Potassium 3.7  3.5 - 5.1 mEq/L   Chloride 99  96 - 112 mEq/L   CO2 26  19 - 32 mEq/L   Glucose, Bld 164 (*) 70 - 99 mg/dL   BUN 17  6 - 23 mg/dL   Creatinine, Ser 7.82  0.50 - 1.10 mg/dL   Calcium 9.6  8.4 - 95.6 mg/dL   GFR calc non Af Amer 47 (*) >90 mL/min   GFR calc Af Amer 54 (*) >90 mL/min    No results  found. No diagnosis found. 1. Folliculitis  MDM  Patient feels much better with Ultram. She appears stable and able to be discharged.   Arnoldo Hooker, PA-C 06/19/13 1904

## 2013-06-19 NOTE — ED Notes (Signed)
Pt saw her pcp yesterday, was prescribed keflex

## 2013-06-22 ENCOUNTER — Encounter: Payer: Self-pay | Admitting: Internal Medicine

## 2013-06-22 ENCOUNTER — Ambulatory Visit (INDEPENDENT_AMBULATORY_CARE_PROVIDER_SITE_OTHER): Payer: Medicare Other | Admitting: Internal Medicine

## 2013-06-22 VITALS — BP 128/68 | HR 96 | Temp 98.5°F | Wt 177.4 lb

## 2013-06-22 DIAGNOSIS — M545 Low back pain, unspecified: Secondary | ICD-10-CM

## 2013-06-22 DIAGNOSIS — E119 Type 2 diabetes mellitus without complications: Secondary | ICD-10-CM

## 2013-06-22 DIAGNOSIS — L03811 Cellulitis of head [any part, except face]: Secondary | ICD-10-CM

## 2013-06-22 DIAGNOSIS — L02818 Cutaneous abscess of other sites: Secondary | ICD-10-CM

## 2013-06-22 NOTE — Assessment & Plan Note (Signed)
Diet controlled until 02/2011 when a1c>7 - started metformin xr 500 qd On ARB - prior statin stopped years ago (pt unsure why) Check a1c q6-62mo, adjust as needed  Lab Results  Component Value Date   HGBA1C 6.1* 04/08/2013

## 2013-06-22 NOTE — Progress Notes (Signed)
Subjective:    Patient ID: Paula [name], female    DOB: 04/16/1927, 79 y.o.   MRN: [id number]  HPI  Here for ER followup - scalp infection, improved with antibiotics   Denies pain or numbness - Increase in chronic nausea, no vomitting Chronic low back pain and fatigue without change  Past Medical History  Diagnosis Date  . Depression   . Ejection fraction     EF 40%, echo, November, 2012  / in improved, EF 60%, echo, December, 2012  . Contrast media allergy     Patient feels poorly with contrast  . Status post AAA (abdominal aortic aneurysm) repair     Surgical repair, Dr. Hart Rochester, December 27, 2011  . Pre-syncope     vasovagal w/ heart block  . Parotid mass     has refused further eval 10/2011  . GERD (gastroesophageal reflux disease)   . Anxiety   . Facial tic     L sided spasms - Botox trial summer 2013, ?effective  . Macular degeneration   . AAA (abdominal aortic aneurysm)     s/p repair 12/2011  . Congestive heart failure     NL EF 10/2011 echo   . Diabetes mellitus type II, controlled   . Hypothyroidism   . Sinus bradycardia 09/19/2011    Occurring simultaneously with complete heart block   . Hypertension   . COPD (chronic obstructive pulmonary disease)   . Abdominal spasms     hemifacial-treated w/ botox xeomin -Dr Terrace Arabia    Review of Systems  HENT: Positive for hoarse voice.   Respiratory: Positive for cough (chronic) and shortness of breath (chronic).   Musculoskeletal: Positive for gait problem (unchanged). Negative for myalgias and joint swelling.  Neurological: Positive for speech difficulty (chronic "drawing up of mouth", follows with neuro for same) and weakness (chronic). Negative for headaches.  Psychiatric/Behavioral: Negative for sleep disturbance and dysphoric mood.       Objective:   Physical Exam  BP 128/68  Pulse 96  Temp(Src) 98.5 F (36.9 C) (Oral)  Wt 177 lb 6.4 oz (80.468 kg)  BMI 29.52 kg/m2  SpO2 94% Wt Readings from Last 3  Encounters:  06/22/13 177 lb 6.4 oz (80.468 kg)  06/18/13 179 lb 12.8 oz (81.557 kg)  05/31/13 177 lb 12.8 oz (80.65 kg)   Gen: NAD, nontoxic. hoarse without change. Spouse at side Lungs: CTA B - faint end exp wheeze and BB fine crackles; diminished air movement at bases CV: RRR, no edema BLE Skin - improved cellulitis on posterior scalp at hairline, no abscess Psychiatric: tangential    Lab Results  Component Value Date   WBC 12.7* 06/19/2013   HGB 11.8* 06/19/2013   HCT 35.9* 06/19/2013   PLT 323 06/19/2013   GLUCOSE 164* 06/19/2013   CHOL 149 02/10/2013   TRIG 218.0* 02/10/2013   HDL 30.10* 02/10/2013   LDLDIRECT 90.6 02/10/2013   LDLCALC 80 11/04/2011   ALT 19 05/30/2013   AST 20 05/30/2013   NA 138 06/19/2013   K 3.7 06/19/2013   CL 99 06/19/2013   CREATININE 1.06 06/19/2013   BUN 17 06/19/2013   CO2 26 06/19/2013   TSH 1.797 04/08/2013   INR 1.06 08/24/2012   HGBA1C 6.1* 04/08/2013       Assessment & Plan:   Cellulitis, scalp with folliculitis - on antibiotics and improved ER eval and OV reviewed Time spent with pt/family today 25 minutes, greater than 50% time spent counseling patient on scalp infection,  ER visit, back pain and medication review. Also review of prior records  See problem list. Medications and labs reviewed today.

## 2013-06-22 NOTE — Patient Instructions (Addendum)
It was good to see you today. We have reviewed your prior records including labs and tests today Medications reviewed and updated, no changes recommended at this time. Followup as scheduled, call sooner if problems Cellulitis Cellulitis is an infection of the skin and the tissue under the skin. The infected area is usually red and tender. This happens most often in the arms and lower legs. HOME CARE   Take your antibiotic medicine as told. Finish the medicine even if you start to feel better.  Keep the infected arm or leg raised (elevated).  Put a warm cloth on the area up to 4 times per day.  Only take medicines as told by your doctor.  Keep all doctor visits as told. GET HELP RIGHT AWAY IF:   You have a fever.  You feel very sleepy.  You throw up (vomit) or have watery poop (diarrhea).  You feel sick and have muscle aches and pains.  You see red streaks on the skin coming from the infected area.  Your red area gets bigger or turns a dark color.  Your bone or joint under the infected area is painful after the skin heals.  Your infection comes back in the same area or different area.  You have a puffy (swollen) bump in the infected area.  You have new symptoms. MAKE SURE YOU:   Understand these instructions.  Will watch your condition.  Will get help right away if you are not doing well or get worse. Document Released: 04/15/2008 Document Revised: 04/28/2012 Document Reviewed: 01/13/2012 [facility] Patient Information 2014 [place], [place].

## 2013-06-22 NOTE — Assessment & Plan Note (Signed)
scheduled to see specialist Dr Retia Passe to consider injections -late 06/2013 Encouraged to resume PT as previously scheduled

## 2013-06-30 ENCOUNTER — Telehealth: Payer: Self-pay | Admitting: *Deleted

## 2013-06-30 ENCOUNTER — Ambulatory Visit (INDEPENDENT_AMBULATORY_CARE_PROVIDER_SITE_OTHER): Payer: Medicare Other | Admitting: Internal Medicine

## 2013-06-30 ENCOUNTER — Encounter: Payer: Self-pay | Admitting: Internal Medicine

## 2013-06-30 VITALS — BP 122/62 | HR 93 | Temp 98.9°F | Wt 178.8 lb

## 2013-06-30 DIAGNOSIS — E119 Type 2 diabetes mellitus without complications: Secondary | ICD-10-CM

## 2013-06-30 DIAGNOSIS — F419 Anxiety disorder, unspecified: Secondary | ICD-10-CM

## 2013-06-30 DIAGNOSIS — J438 Other emphysema: Secondary | ICD-10-CM

## 2013-06-30 DIAGNOSIS — G518 Other disorders of facial nerve: Secondary | ICD-10-CM

## 2013-06-30 DIAGNOSIS — G514 Facial myokymia: Secondary | ICD-10-CM

## 2013-06-30 DIAGNOSIS — J439 Emphysema, unspecified: Secondary | ICD-10-CM

## 2013-06-30 DIAGNOSIS — I10 Essential (primary) hypertension: Secondary | ICD-10-CM

## 2013-06-30 DIAGNOSIS — F411 Generalized anxiety disorder: Secondary | ICD-10-CM

## 2013-06-30 MED ORDER — GUAIFENESIN ER 600 MG PO TB12: 600.0000 mg | ORAL_TABLET | Freq: Two times a day (BID) | ORAL | Status: DC

## 2013-06-30 NOTE — Telephone Encounter (Signed)
Spoke with pt advised of MDs message 

## 2013-06-30 NOTE — Assessment & Plan Note (Signed)
  BP Readings from Last 3 Encounters:  06/30/13 122/62  06/22/13 128/68  06/19/13 140/68   The current medical regimen is effective;  continue present plan and medications.

## 2013-06-30 NOTE — Telephone Encounter (Signed)
At this time, the only change I recommend is to increase Mucinex 2 twice daily. If persisting problems after next 48 hours, please call and I will then add prednisone -but no prednisone needed at this time

## 2013-06-30 NOTE — Assessment & Plan Note (Signed)
Chronic dyspnea on exertion and sensation of "phlegm" chronic bronchitis with chronic cough symptoms Complicated by medical compliance/confusion and lack of belief in medication efficacy driving noncompliance (suspect mild dementia of spouse AND patient) Also pt has macular degeneration so depends on spouse to supply her meds No clinical evidence for infection or CHF on exam (2D echo 12/2012 normal LVEF) Recent eval OVs here and pulm + IP/ER visits reviewed; also imaging  Continue pulm meds and cough suppression - reviewed prescribed med schedule at length today with spouse and pt Continue eval and and tx by pulm (last OV reviewed - symptoms felt psyc>pulm as PFTs ok)

## 2013-06-30 NOTE — Assessment & Plan Note (Signed)
eval and tx ongoing by neuro - ?benefit of prior botox injections -  follow up for repeat injection as needed (neuro feels psychogenic) Continue baclofen as ongoing

## 2013-06-30 NOTE — Assessment & Plan Note (Signed)
Diet controlled until 02/2011 when a1c>7 - started metformin xr 500 qd On ARB, but reports intol to statin years ago  Follows with optho - rankin Check a1c q6-33mo, adjust as needed  Lab Results  Component Value Date   HGBA1C 6.1* 04/08/2013

## 2013-06-30 NOTE — Telephone Encounter (Signed)
Pt called states she thought an Rx for Prednisone was also to be sent to pharmacy.  Please advise

## 2013-06-30 NOTE — Progress Notes (Signed)
Subjective:    Patient ID: Paula [name], female    DOB: August 25, 1927, 79 y.o.   MRN: [id number]  Breathing Problem She complains of cough (chronic), difficulty breathing, hoarse voice and shortness of breath (chronic). This is a chronic problem. The current episode started more than 1 year ago. The problem occurs daily. The problem has been waxing and waning. The cough is productive of sputum, hacking and supine. Associated symptoms include dyspnea on exertion and malaise/fatigue. Pertinent negatives include no ear pain, fever, headaches, heartburn, myalgias, nasal congestion, postnasal drip, rhinorrhea, sneezing, sore throat, trouble swallowing or weight loss. Her symptoms are aggravated by lying down and minimal activity. Her symptoms are alleviated by anxiolytics and OTC cough suppressant. She reports minimal improvement on treatment. Her past medical history is significant for bronchitis. Bronchiectasis: ?    Past Medical History  Diagnosis Date  . Depression   . Ejection fraction     EF 40%, echo, November, 2012  / in improved, EF 60%, echo, December, 2012  . Contrast media allergy     Patient feels poorly with contrast  . Status post AAA (abdominal aortic aneurysm) repair     Surgical repair, Dr. Hart Rochester, December 27, 2011  . Pre-syncope     vasovagal w/ heart block  . Parotid mass     has refused further eval 10/2011  . GERD (gastroesophageal reflux disease)   . Anxiety   . Facial tic     L sided spasms - Botox trial summer 2013, ?effective  . Macular degeneration   . AAA (abdominal aortic aneurysm)     s/p repair 12/2011  . Congestive heart failure     NL EF 10/2011 echo   . Diabetes mellitus type II, controlled   . Hypothyroidism   . Sinus bradycardia 09/19/2011    Occurring simultaneously with complete heart block   . Hypertension   . COPD (chronic obstructive pulmonary disease)   . Abdominal spasms     hemifacial-treated w/ botox xeomin -Dr Terrace Arabia    Review of Systems   Constitutional: Positive for malaise/fatigue. Negative for fever and weight loss.  HENT: Positive for hoarse voice. Negative for ear pain, sore throat, rhinorrhea, sneezing, trouble swallowing and postnasal drip.   Respiratory: Positive for cough (chronic) and shortness of breath (chronic).   Cardiovascular: Positive for dyspnea on exertion.  Gastrointestinal: Negative for heartburn.  Musculoskeletal: Positive for gait problem. Negative for myalgias and joint swelling.  Neurological: Positive for speech difficulty (chronic "drawing up of mouth", follows with neuro for same) and weakness. Negative for headaches.  Psychiatric/Behavioral: Negative for sleep disturbance and dysphoric mood.       Objective:   Physical Exam  BP 122/62  Pulse 93  Temp(Src) 98.9 F (37.2 C) (Oral)  Wt 178 lb 12.8 oz (81.103 kg)  BMI 29.75 kg/m2  SpO2 93% Wt Readings from Last 3 Encounters:  06/30/13 178 lb 12.8 oz (81.103 kg)  06/22/13 177 lb 6.4 oz (80.468 kg)  06/18/13 179 lb 12.8 oz (81.557 kg)   Gen: NAD, nontoxic. hoarse without change. Spouse at side Lungs: CTA B - faint end exp wheeze with diminished air movement at bases CV: RRR, no edema BLE MSkel: Back: patient is able to heel toe walk with use of walker and ambulates with cautious gait. No gross deformity or joint swelling Psychiatric: tangential    Lab Results  Component Value Date   WBC 12.7* 06/19/2013   HGB 11.8* 06/19/2013   HCT 35.9* 06/19/2013  PLT 323 06/19/2013   GLUCOSE 164* 06/19/2013   CHOL 149 02/10/2013   TRIG 218.0* 02/10/2013   HDL 30.10* 02/10/2013   LDLDIRECT 90.6 02/10/2013   LDLCALC 80 11/04/2011   ALT 19 05/30/2013   AST 20 05/30/2013   NA 138 06/19/2013   K 3.7 06/19/2013   CL 99 06/19/2013   CREATININE 1.06 06/19/2013   BUN 17 06/19/2013   CO2 26 06/19/2013   TSH 1.797 04/08/2013   INR 1.06 08/24/2012   HGBA1C 6.1* 04/08/2013       Assessment & Plan:   See problem list. Medications and labs reviewed today.

## 2013-06-30 NOTE — Assessment & Plan Note (Signed)
Significant component of anxiety evident on exam (spouse and pt), likely contributing to chronic symptoms Review and reassurance provided to patient and spouse today Continue same sertraline  Last increased BZ to TID 02/2013 S/p counseling with gutterman 02/2013 x 1 OV, to follow up prn

## 2013-06-30 NOTE — Patient Instructions (Addendum)
It was good to see you today. We have reviewed your prior records including labs and tests today Medications reviewed and updated: increase Mucinex to 600mg  2x/day for mucus  no other changes recommended at this time. If you develop worsening symptoms or fever, call and we can reconsider antibiotics, but it does not appear necessary to use antibiotics at this time. Followup as scheduled, call sooner if problems

## 2013-07-01 ENCOUNTER — Other Ambulatory Visit: Payer: Self-pay | Admitting: *Deleted

## 2013-07-01 MED ORDER — IPRATROPIUM-ALBUTEROL 20-100 MCG/ACT IN AERS: 1.0000 | INHALATION_SPRAY | Freq: Four times a day (QID) | RESPIRATORY_TRACT | Status: DC | PRN

## 2013-07-05 ENCOUNTER — Telehealth: Payer: Self-pay | Admitting: *Deleted

## 2013-07-05 NOTE — Telephone Encounter (Signed)
[facility] Triage Call Report Triage Record Num: [id number] Operator: Valene Bors Patient Name: Paula [name] Call Date & Time: 07/02/2013 5:17:01PM Patient Phone: [phone] PCP: Rene Paci Patient Gender: Female PCP Fax : [phone] Patient DOB: March 07, 1927 Practice Name: [facility] Reason for Call: Caller: [name]/Patient; PCP: Rene Paci (Adults only); CB#: [phone]; Call regarding continued Cough/Congestion and voice is hoarse. Seen in the office on 04/30/13 and told to call if sx not improving. Took course of Prednisone on 06/26/13. She is doing PT twice a week. She is not able to check her BG because she cannot see good and her husband has trouble pricking her finger. Afebrile. She is not concerned about her cough. She always has one. Triage and care advice per Diabetes: Respiratory Problems Protocol and advised to be rechecked within 2 weeks for "Recent or recurrent episodes of sneezing, nasal congestion; watery nasal drainage; scratchy/itchy throat; red/itchy water eyes or cough unrelieved after one week of home care measures.". She will call back with new sx or on Monday-07/05/13 if still no improvement. Told to warm up hand with hot washcloth then prick finger and call if BG elevated. Takes Metformin. Protocol(s) Used: Diabetes: Respiratory Problems Recommended Outcome per Protocol: See Provider within 2 Weeks Reason for Outcome: Recent or recurrent episodes of sneezing, nasal congestion; watery nasal drainage; scratchy/itchy throat; red/itchy/watery eyes or cough unrelieved after one week of home care measures Care Advice: For perennial symptoms, consider damp mopping or vacuuming house 2-3 times weekly, limit contact with pets, remove feather pillows, use nonallergenic cosmetics, and eliminate areas where mold spores can collect (e.g., piles of old newspapers and magazines, or old furniture). ~ Use an air purifier, clean or change filters on  heating/cooling units at least monthly. Maintaining humidity in home below 50%, helps prevent dust mites and mold. ~ Follow the usual meal plan if possible. Drink extra non-caloric fluids. If unable to eat at all, drink regular soft drinks and juices so that 50 grams of carbohydrates are taken in every 3 to 4 hours. ~ ~ Consider use of a saline nasal spray per package directions to help relieve nasal congestion. ~ SYMPTOM / CONDITION MANAGEMENT Check with provider before using nonprescription medications. Many drugs may raise or lower the blood sugar level. Always check the labels for ingredients. Some liquids like cough syrup may contain sugar. ~ Diabetes Action Plan: - Follow recommended action plan and diet - Check blood sugar as recommended - Take diabetic pills or insulin as ordered - Follow action plan for illness. ~ 07/02/2013 5:56:35PM Page 1 of 1 [id number]

## 2013-07-11 ENCOUNTER — Inpatient Hospital Stay (HOSPITAL_COMMUNITY)
Admission: EM | Admit: 2013-07-11 | Discharge: 2013-07-13 | DRG: 293 | Disposition: A | Discharge status: Home / Self Care | Payer: Medicare Other | Attending: Internal Medicine | Admitting: Internal Medicine

## 2013-07-11 ENCOUNTER — Encounter (HOSPITAL_COMMUNITY): Payer: Self-pay | Admitting: Emergency Medicine

## 2013-07-11 ENCOUNTER — Emergency Department (HOSPITAL_COMMUNITY): Payer: Medicare Other

## 2013-07-11 DIAGNOSIS — E039 Hypothyroidism, unspecified: Secondary | ICD-10-CM | POA: Diagnosis present

## 2013-07-11 DIAGNOSIS — Z91199 Patient's noncompliance with other medical treatment and regimen due to unspecified reason: Secondary | ICD-10-CM

## 2013-07-11 DIAGNOSIS — F3289 Other specified depressive episodes: Secondary | ICD-10-CM | POA: Diagnosis present

## 2013-07-11 DIAGNOSIS — E1151 Type 2 diabetes mellitus with diabetic peripheral angiopathy without gangrene: Secondary | ICD-10-CM | POA: Diagnosis present

## 2013-07-11 DIAGNOSIS — I10 Essential (primary) hypertension: Secondary | ICD-10-CM | POA: Diagnosis present

## 2013-07-11 DIAGNOSIS — Z66 Do not resuscitate: Secondary | ICD-10-CM | POA: Diagnosis present

## 2013-07-11 DIAGNOSIS — J449 Chronic obstructive pulmonary disease, unspecified: Secondary | ICD-10-CM

## 2013-07-11 DIAGNOSIS — D72829 Elevated white blood cell count, unspecified: Secondary | ICD-10-CM | POA: Diagnosis present

## 2013-07-11 DIAGNOSIS — I5032 Chronic diastolic (congestive) heart failure: Secondary | ICD-10-CM | POA: Diagnosis present

## 2013-07-11 DIAGNOSIS — R0902 Hypoxemia: Secondary | ICD-10-CM

## 2013-07-11 DIAGNOSIS — F411 Generalized anxiety disorder: Secondary | ICD-10-CM | POA: Diagnosis present

## 2013-07-11 DIAGNOSIS — Z9119 Patient's noncompliance with other medical treatment and regimen: Secondary | ICD-10-CM

## 2013-07-11 DIAGNOSIS — Z79899 Other long term (current) drug therapy: Secondary | ICD-10-CM

## 2013-07-11 DIAGNOSIS — F329 Major depressive disorder, single episode, unspecified: Secondary | ICD-10-CM | POA: Diagnosis present

## 2013-07-11 DIAGNOSIS — I509 Heart failure, unspecified: Principal | ICD-10-CM

## 2013-07-11 DIAGNOSIS — H3530 Unspecified macular degeneration: Secondary | ICD-10-CM | POA: Diagnosis present

## 2013-07-11 DIAGNOSIS — F419 Anxiety disorder, unspecified: Secondary | ICD-10-CM

## 2013-07-11 DIAGNOSIS — J439 Emphysema, unspecified: Secondary | ICD-10-CM | POA: Diagnosis present

## 2013-07-11 DIAGNOSIS — T380X5A Adverse effect of glucocorticoids and synthetic analogues, initial encounter: Secondary | ICD-10-CM | POA: Diagnosis present

## 2013-07-11 DIAGNOSIS — J4489 Other specified chronic obstructive pulmonary disease: Secondary | ICD-10-CM | POA: Diagnosis present

## 2013-07-11 DIAGNOSIS — K219 Gastro-esophageal reflux disease without esophagitis: Secondary | ICD-10-CM | POA: Diagnosis present

## 2013-07-11 DIAGNOSIS — Z791 Long term (current) use of non-steroidal anti-inflammatories (NSAID): Secondary | ICD-10-CM

## 2013-07-11 DIAGNOSIS — E119 Type 2 diabetes mellitus without complications: Secondary | ICD-10-CM | POA: Diagnosis present

## 2013-07-11 DIAGNOSIS — M545 Low back pain: Secondary | ICD-10-CM | POA: Diagnosis present

## 2013-07-11 DIAGNOSIS — Z7982 Long term (current) use of aspirin: Secondary | ICD-10-CM

## 2013-07-11 DIAGNOSIS — Z87891 Personal history of nicotine dependence: Secondary | ICD-10-CM

## 2013-07-11 LAB — CBC WITH DIFFERENTIAL/PLATELET
Basophils Absolute: 0.1 10*3/uL (ref 0.0–0.1)
Basophils Relative: 1 % (ref 0–1)
Eosinophils Absolute: 0.5 K/uL (ref 0.0–0.7)
Eosinophils Relative: 4 % (ref 0–5)
HCT: 33.2 % — ABNORMAL LOW (ref 36.0–46.0)
Hemoglobin: 10.8 g/dL — ABNORMAL LOW (ref 12.0–15.0)
Lymphocytes Relative: 20 % (ref 12–46)
Lymphs Abs: 2.3 K/uL (ref 0.7–4.0)
MCH: 33.9 pg (ref 26.0–34.0)
MCHC: 32.5 g/dL (ref 30.0–36.0)
MCV: 104.1 fL — ABNORMAL HIGH (ref 78.0–100.0)
Monocytes Absolute: 0.7 K/uL (ref 0.1–1.0)
Monocytes Relative: 6 % (ref 3–12)
Neutro Abs: 7.8 10*3/uL — ABNORMAL HIGH (ref 1.7–7.7)
Neutrophils Relative %: 69 % (ref 43–77)
Platelets: 281 K/uL (ref 150–400)
RBC: 3.19 MIL/uL — ABNORMAL LOW (ref 3.87–5.11)
RDW: 14.1 % (ref 11.5–15.5)
WBC: 11.4 10*3/uL — ABNORMAL HIGH (ref 4.0–10.5)

## 2013-07-11 LAB — COMPREHENSIVE METABOLIC PANEL WITH GFR
Alkaline Phosphatase: 48 U/L (ref 39–117)
BUN: 8 mg/dL (ref 6–23)
Chloride: 105 meq/L (ref 96–112)
GFR calc Af Amer: 72 mL/min — ABNORMAL LOW (ref >90)
Glucose, Bld: 123 mg/dL — ABNORMAL HIGH (ref 70–99)
Potassium: 4 meq/L (ref 3.5–5.1)
Total Bilirubin: 0.5 mg/dL (ref 0.3–1.2)

## 2013-07-11 LAB — COMPREHENSIVE METABOLIC PANEL
ALT: 17 U/L (ref 0–35)
AST: 21 U/L (ref 0–37)
Albumin: 3.7 g/dL (ref 3.5–5.2)
CO2: 29 mEq/L (ref 19–32)
Calcium: 9.5 mg/dL (ref 8.4–10.5)
Creatinine, Ser: 0.84 mg/dL (ref 0.50–1.10)
GFR calc non Af Amer: 62 mL/min — ABNORMAL LOW (ref >90)
Sodium: 139 mEq/L (ref 135–145)
Total Protein: 6.3 g/dL (ref 6.0–8.3)

## 2013-07-11 LAB — TROPONIN I
Troponin I: <0.3 ng/mL (ref <0.30)
Troponin I: <0.3 ng/mL (ref <0.30)

## 2013-07-11 LAB — URINE MICROSCOPIC-ADD ON

## 2013-07-11 LAB — PRO B NATRIURETIC PEPTIDE: Pro B Natriuretic peptide (BNP): 697.2 pg/mL — ABNORMAL HIGH (ref 0–450)

## 2013-07-11 LAB — PROTIME-INR: Prothrombin Time: 14.1 seconds (ref 11.6–15.2)

## 2013-07-11 LAB — CREATININE, SERUM
Creatinine, Ser: 0.93 mg/dL (ref 0.50–1.10)
GFR calc non Af Amer: 55 mL/min — ABNORMAL LOW (ref >90)

## 2013-07-11 LAB — URINALYSIS, ROUTINE W REFLEX MICROSCOPIC
Bilirubin Urine: NEGATIVE
Glucose, UA: NEGATIVE mg/dL
Hgb urine dipstick: NEGATIVE
Ketones, ur: NEGATIVE mg/dL
pH: 6 (ref 5.0–8.0)

## 2013-07-11 LAB — LIPASE, BLOOD: Lipase: 32 U/L (ref 11–59)

## 2013-07-11 MED ORDER — PREDNISONE 20 MG PO TABS
60.0000 mg | ORAL_TABLET | Freq: Once | ORAL | Status: AC
  Administered 2013-07-11: 60 mg via ORAL
  Filled 2013-07-11: qty 3

## 2013-07-11 MED ORDER — SODIUM CHLORIDE 0.9 % IJ SOLN
3.0000 mL | Freq: Two times a day (BID) | INTRAMUSCULAR | Status: DC
  Administered 2013-07-11 – 2013-07-13 (×4): 3 mL via INTRAVENOUS

## 2013-07-11 MED ORDER — SODIUM CHLORIDE 0.9 % IJ SOLN: 3.0000 mL | INTRAMUSCULAR | Status: DC | PRN

## 2013-07-11 MED ORDER — LORATADINE 10 MG PO TABS
10.0000 mg | ORAL_TABLET | Freq: Every day | ORAL | Status: DC
  Administered 2013-07-12 – 2013-07-13 (×2): 10 mg via ORAL
  Filled 2013-07-11 (×2): qty 1

## 2013-07-11 MED ORDER — ALBUTEROL SULFATE (5 MG/ML) 0.5% IN NEBU
INHALATION_SOLUTION | RESPIRATORY_TRACT | Status: AC
  Administered 2013-07-11: 2.5 mg
  Filled 2013-07-11: qty 0.5

## 2013-07-11 MED ORDER — POTASSIUM CHLORIDE CRYS ER 20 MEQ PO TBCR
20.0000 meq | EXTENDED_RELEASE_TABLET | Freq: Every day | ORAL | Status: DC
  Administered 2013-07-12 – 2013-07-13 (×2): 20 meq via ORAL
  Filled 2013-07-11 (×2): qty 1

## 2013-07-11 MED ORDER — PANTOPRAZOLE SODIUM 40 MG PO TBEC
40.0000 mg | DELAYED_RELEASE_TABLET | Freq: Every day | ORAL | Status: DC
  Administered 2013-07-12 – 2013-07-13 (×2): 40 mg via ORAL
  Filled 2013-07-11 (×2): qty 1

## 2013-07-11 MED ORDER — SODIUM CHLORIDE 0.9 % IV SOLN: 250.0000 mL | INTRAVENOUS | Status: DC | PRN

## 2013-07-11 MED ORDER — INSULIN ASPART 100 UNIT/ML ~~LOC~~ SOLN
0.0000 [IU] | Freq: Three times a day (TID) | SUBCUTANEOUS | Status: DC
  Administered 2013-07-11: 18:00:00 7 [IU] via SUBCUTANEOUS
  Administered 2013-07-12 – 2013-07-13 (×3): 3 [IU] via SUBCUTANEOUS

## 2013-07-11 MED ORDER — FUROSEMIDE 10 MG/ML IJ SOLN
40.0000 mg | Freq: Once | INTRAMUSCULAR | Status: AC
  Administered 2013-07-11: 40 mg via INTRAVENOUS
  Filled 2013-07-11: qty 4

## 2013-07-11 MED ORDER — BACLOFEN 5 MG HALF TABLET
5.0000 mg | ORAL_TABLET | Freq: Two times a day (BID) | ORAL | Status: DC | PRN
  Administered 2013-07-11 – 2013-07-12 (×2): 5 mg via ORAL
  Filled 2013-07-11: qty 2

## 2013-07-11 MED ORDER — ENOXAPARIN SODIUM 40 MG/0.4ML ~~LOC~~ SOLN
40.0000 mg | SUBCUTANEOUS | Status: DC
  Administered 2013-07-11 – 2013-07-12 (×2): 40 mg via SUBCUTANEOUS
  Filled 2013-07-11 (×3): qty 0.4

## 2013-07-11 MED ORDER — SERTRALINE HCL 50 MG PO TABS
50.0000 mg | ORAL_TABLET | Freq: Every day | ORAL | Status: DC
  Administered 2013-07-12 – 2013-07-13 (×2): 50 mg via ORAL
  Filled 2013-07-11 (×2): qty 1

## 2013-07-11 MED ORDER — ALBUTEROL SULFATE (5 MG/ML) 0.5% IN NEBU
5.0000 mg | INHALATION_SOLUTION | Freq: Once | RESPIRATORY_TRACT | Status: DC
  Filled 2013-07-11: qty 1

## 2013-07-11 MED ORDER — LOSARTAN POTASSIUM 50 MG PO TABS
50.0000 mg | ORAL_TABLET | Freq: Every day | ORAL | Status: DC
  Administered 2013-07-11 – 2013-07-13 (×3): 50 mg via ORAL
  Filled 2013-07-11 (×3): qty 1

## 2013-07-11 MED ORDER — IPRATROPIUM BROMIDE 0.02 % IN SOLN
RESPIRATORY_TRACT | Status: AC
  Administered 2013-07-11: 0.5 mg
  Filled 2013-07-11: qty 2.5

## 2013-07-11 MED ORDER — ASPIRIN EC 81 MG PO TBEC
81.0000 mg | DELAYED_RELEASE_TABLET | Freq: Every morning | ORAL | Status: DC
  Administered 2013-07-12 – 2013-07-13 (×2): 81 mg via ORAL
  Filled 2013-07-11 (×2): qty 1

## 2013-07-11 MED ORDER — ONDANSETRON HCL 4 MG/2ML IJ SOLN: 4.0000 mg | Freq: Four times a day (QID) | INTRAMUSCULAR | Status: DC | PRN

## 2013-07-11 MED ORDER — LEVOTHYROXINE SODIUM 175 MCG PO TABS
175.0000 ug | ORAL_TABLET | Freq: Every day | ORAL | Status: DC
Start: 2013-07-12 — End: 2013-07-13
  Administered 2013-07-12 – 2013-07-13 (×2): 175 ug via ORAL
  Filled 2013-07-11 (×3): qty 1

## 2013-07-11 MED ORDER — ACETAMINOPHEN 325 MG PO TABS
650.0000 mg | ORAL_TABLET | ORAL | Status: DC | PRN
  Administered 2013-07-11 – 2013-07-13 (×4): 650 mg via ORAL
  Filled 2013-07-11 (×4): qty 2

## 2013-07-11 MED ORDER — FUROSEMIDE 10 MG/ML IJ SOLN
40.0000 mg | Freq: Two times a day (BID) | INTRAMUSCULAR | Status: DC
  Administered 2013-07-11 – 2013-07-12 (×2): 40 mg via INTRAVENOUS
  Filled 2013-07-11 (×3): qty 4

## 2013-07-11 MED ORDER — ALPRAZOLAM 1 MG PO TABS
1.0000 mg | ORAL_TABLET | Freq: Three times a day (TID) | ORAL | Status: DC | PRN
  Administered 2013-07-11 – 2013-07-13 (×4): 1 mg via ORAL
  Filled 2013-07-11 (×4): qty 1

## 2013-07-11 MED ORDER — GUAIFENESIN ER 600 MG PO TB12
600.0000 mg | ORAL_TABLET | Freq: Two times a day (BID) | ORAL | Status: DC
  Administered 2013-07-11 – 2013-07-13 (×4): 600 mg via ORAL
  Filled 2013-07-11 (×5): qty 1

## 2013-07-11 MED ORDER — CARVEDILOL 3.125 MG PO TABS
3.1250 mg | ORAL_TABLET | Freq: Two times a day (BID) | ORAL | Status: DC
  Administered 2013-07-11 – 2013-07-13 (×4): 3.125 mg via ORAL
  Filled 2013-07-11 (×6): qty 1

## 2013-07-11 NOTE — ED Notes (Signed)
Patient walked on room air. 84% with heart rate 121

## 2013-07-11 NOTE — ED Provider Notes (Signed)
CSN: [id number]     Arrival date & time 07/11/13  4098 History   First MD Initiated Contact with Patient 07/11/13 [phone]     Chief Complaint  Patient presents with  . Nausea  . Back Pain   (Consider location/radiation/quality/duration/timing/severity/associated sxs/prior Treatment) Patient is a 79 y.o. female presenting with wheezing. The history is provided by the patient, the spouse and medical records.  Wheezing Severity:  Moderate Severity compared to prior episodes:  Similar Onset quality:  Gradual Duration:  12 hours Timing:  Constant Progression:  Waxing and waning Chronicity:  Recurrent Context: not dust, not exposure to allergen and not smoke exposure   Relieved by:  Nothing Worsened by:  Nothing tried Associated symptoms: cough, fatigue and shortness of breath   Associated symptoms: no fever, no headaches, no rash and no rhinorrhea     Past Medical History  Diagnosis Date  . Depression   . Ejection fraction     EF 40%, echo, November, 2012  / in improved, EF 60%, echo, December, 2012  . Contrast media allergy     Patient feels poorly with contrast  . Status post AAA (abdominal aortic aneurysm) repair     Surgical repair, Dr. Hart Rochester, December 27, 2011  . Pre-syncope     vasovagal w/ heart block  . Parotid mass     has refused further eval 10/2011  . GERD (gastroesophageal reflux disease)   . Anxiety   . Facial tic     L sided spasms - Botox trial summer 2013, ?effective  . Macular degeneration   . AAA (abdominal aortic aneurysm)     s/p repair 12/2011  . Congestive heart failure     NL EF 10/2011 echo   . Diabetes mellitus type II, controlled   . Hypothyroidism   . Sinus bradycardia 09/19/2011    Occurring simultaneously with complete heart block   . Hypertension   . COPD (chronic obstructive pulmonary disease)   . Abdominal spasms     hemifacial-treated w/ botox xeomin -Dr Terrace Arabia   Past Surgical History  Procedure Laterality Date  . Cholecystectomy    .  Knee cartilage surgery      left  . Sinus surgery with instatrak    . Appendectomy    . Oophorectomy      ovarian cyst  . Cataract extraction      bilateral  . Bladder repair    . US echocardiography  10/14/11  . Abdominal aortic aneurysm repair  12/27/2011    Procedure: ANEURYSM ABDOMINAL AORTIC REPAIR;  Surgeon: Josephina Gip, MD;  Location: [facility] OR;  Service: Vascular;  Laterality: N/A;  Resection and Grafting Abdominal Aortic Aneurysm , Aorta Bi Iliac.  Marland Kitchen Pacemaker insertion  11/12  . Cardiac catheterization  11/04/11   Family History  Problem Relation Age of Onset  . Esophageal cancer Father   . Cancer Father   . Breast cancer Sister   . Cancer Sister   . Colon cancer Sister   . Heart disease Sister   . Heart disease Mother   . Heart disease Son    History  Substance Use Topics  . Smoking status: Former Smoker -- 1.00 packs/day for 30 years    Types: Cigarettes    Quit date: 08/31/2011  . Smokeless tobacco: Never Used  . Alcohol Use: No   OB History   Grav Para Term Preterm Abortions TAB SAB Ect Mult Living  Review of Systems  Constitutional: Positive for chills, appetite change and fatigue. Negative for fever.  HENT: Negative for congestion and rhinorrhea.   Respiratory: Positive for cough, shortness of breath and wheezing.   Gastrointestinal: Positive for nausea. Negative for vomiting.  Musculoskeletal: Positive for back pain.  Skin: Negative for rash and wound.  Neurological: Negative for dizziness and headaches.  All other systems reviewed and are negative.    Allergies  Bactrim; Contrast media; Gadolinium derivatives; Iohexol; Penicillins; Sulfa antibiotics; Avelox; Ciprofloxacin; Delsym; and Hydromet  Home Medications   Current Outpatient Rx  Name  Route  Sig  Dispense  Refill  . albuterol (PROVENTIL HFA;VENTOLIN HFA) 108 (90 BASE) MCG/ACT inhaler   Inhalation   Inhale 2 puffs into the lungs every 6 (six) hours as needed for wheezing.    1 Inhaler   2   . ALPRAZolam (XANAX) 1 MG tablet   Oral   Take 1 mg by mouth 3 (three) times daily as needed for anxiety.         Marland Kitchen aspirin EC 81 MG tablet   Oral   Take 81 mg by mouth every morning.          . baclofen (LIORESAL) 10 MG tablet   Oral   Take 5-10 mg by mouth 2 (two) times daily as needed (for muscle spasms).         . calcium carbonate (OS-CAL) 600 MG TABS   Oral   Take 600 mg by mouth every other day.          . cetirizine (ZYRTEC) 10 MG tablet   Oral   Take 10 mg by mouth daily as needed for allergies.         . Cholecalciferol (VITAMIN D3) 5000 UNITS TABS   Oral   Take 1 tablet by mouth every other day.          . furosemide (LASIX) 20 MG tablet   Oral   Take 20 mg by mouth daily.          Marland Kitchen guaiFENesin (MUCINEX) 600 MG 12 hr tablet   Oral   Take 1 tablet (600 mg total) by mouth 2 (two) times daily.   60 tablet   5   . Ipratropium-Albuterol (COMBIVENT) 20-100 MCG/ACT AERS respimat   Inhalation   Inhale 1 puff into the lungs every 6 (six) hours as needed for wheezing or shortness of breath.   4 g   3   . levothyroxine (SYNTHROID, LEVOTHROID) 175 MCG tablet   Oral   Take 1 tablet (175 mcg total) by mouth daily.   30 tablet   0   . losartan-hydrochlorothiazide (HYZAAR) 50-12.5 MG per tablet   Oral   Take 1 tablet by mouth daily.          . metFORMIN (GLUCOPHAGE-XR) 500 MG 24 hr tablet   Oral   Take 1 tablet (500 mg total) by mouth daily with breakfast.   90 tablet   2   . ondansetron (ZOFRAN) 4 MG tablet   Oral   Take 1 tablet (4 mg total) by mouth every 8 (eight) hours as needed for nausea.   20 tablet   1   . pantoprazole (PROTONIX) 40 MG tablet   Oral   Take 1 tablet (40 mg total) by mouth daily.   90 tablet   1   . potassium chloride SA (K-DUR,KLOR-CON) 20 MEQ tablet   Oral   Take 1 tablet (20 mEq total) by  mouth daily.   90 tablet   1   . sertraline (ZOLOFT) 50 MG tablet   Oral   Take 1 tablet (50 mg  total) by mouth daily.   90 tablet   1    BP 158/63  Pulse 87  Temp(Src) 99.3 F (37.4 C) (Rectal)  Resp 25  SpO2 92% Physical Exam  Nursing note and vitals reviewed. Constitutional: She is oriented to person, place, and time. She appears well-developed and well-nourished. No distress.  HENT:  Head: Normocephalic and atraumatic.  Mouth/Throat: No oropharyngeal exudate.  Eyes: EOM are normal. No scleral icterus.  Neck: Normal range of motion. Neck supple.  Cardiovascular: Normal rate, regular rhythm and intact distal pulses.   No murmur heard. Pulmonary/Chest: Effort normal. No respiratory distress. She has wheezes. She exhibits no tenderness.  Abdominal: Soft. Bowel sounds are normal. She exhibits no distension. There is no tenderness. There is no rebound.  Musculoskeletal: She exhibits no edema and no tenderness.  Neurological: She is alert and oriented to person, place, and time.  Skin: Skin is warm and dry. No rash noted. She is not diaphoretic.    ED Course  Procedures (including critical care time) Labs Review Labs Reviewed  CBC WITH DIFFERENTIAL - Abnormal; Notable for the following:    WBC 11.4 (*)    RBC 3.19 (*)    Hemoglobin 10.8 (*)    HCT 33.2 (*)    MCV 104.1 (*)    Neutro Abs 7.8 (*)    All other components within normal limits  PRO B NATRIURETIC PEPTIDE - Abnormal; Notable for the following:    Pro B Natriuretic peptide (BNP) 697.2 (*)    All other components within normal limits  COMPREHENSIVE METABOLIC PANEL - Abnormal; Notable for the following:    Glucose, Bld 123 (*)    GFR calc non Af Amer 62 (*)    GFR calc Af Amer 72 (*)    All other components within normal limits  TROPONIN I  LIPASE, BLOOD   Imaging Review Dg Chest 2 View  07/11/2013   *RADIOLOGY REPORT*  Clinical Data: Wheezing.  CHEST - 2 VIEW  Comparison: Chest x-ray 05/30/2013.  Findings: Lung volumes are normal.  No consolidative airspace disease.  Small bilateral pleural effusions.   Calcified granuloma in the left lower lobe. There is cephalization of the pulmonary vasculature and slight indistinctness of the interstitial markings suggestive of mild pulmonary edema.  Mild cardiomegaly. The patient is rotated to the right on today's exam, resulting in distortion of the mediastinal contours and reduced diagnostic sensitivity and specificity for mediastinal pathology.  Atherosclerosis in the thoracic aorta.  IMPRESSION: 1.  The appearance the chest suggest mild congestive heart failure, as above. 2.  Atherosclerosis.   Original Report Authenticated By: Trudie Reed, M.D.    RA sat is 92% and I interpret to be low.  Pt reports when she sees her pulmonologist in office, she has never been told her saturations are low.    ECG at time 0937 shows SR at rate 72, first degree AV block, normal axis, no ST or T wave abns.  No sig change from ECG on 05/30/13.  Abn ECG.    12:58 PM Pt reports feeling improved, hungry, has eaten.  Plan is to ambulate and assess her ability to exert and check pulse ox during exertion.  I reviewed prior clinic notes in [facility] and office notes by Dr. Felicity Coyer shows pt's O2 sats during visits range from 91-94%.  1:12 PM With ambulation on RA,saturations dropped to 84%, HR up to 120's, pt felt very SOB.  Will add lasix, give another neb and consult [facility] for admission.      MDM   1. COPD (chronic obstructive pulmonary disease)   2. CHF (congestive heart failure)   3. Hypoxia       Pt with back pain, wheezing, likely related to COPD, bronchitis.  No fever, not toxic appearing, sats are borderline low however.  Will give nebs, steroids, monitor for improvement, obtain CXR, labs including cardiac and LFT's.      Gavin Pound. Mahlia Fernando, MD 07/11/13 1313

## 2013-07-11 NOTE — ED Notes (Signed)
Patient states that she is just generally not feeling well. States that she has been nauseous, had back pain, has been wheezing for the last 2 days. Outside of her regular medications patient has taken combivent  To relieve her wheezing with positive results.

## 2013-07-11 NOTE — H&P (Signed)
[facility] History and Physical  Paula [name]:[id number] DOB: 1927/01/24 DOA: 07/11/2013  Referring physician: Dr. Oletta Lamas PCP: Rene Paci, MD  Specialists: Cardiology: Dr Celestia Khat: Dr. Vassie Loll  Chief Complaint: Wheezing  HPI: Paula [name] is a 79 y.o. female  With history of CHF last 2-D echo EF of 60% December 2012, diabetes, hypertension, hypothyroidism, prior history of tobacco abuse, who presents to the ED with a three-day history of worsening wheezing. Patient stated they just got [place] from the [place] with her husband and went to [facility] however patient did not take her Lasix during this 2-3 day trip secondary to increased urination. Patient denied any chest pain, no shortness of breath, no fever, no chills, no vomiting, no abdominal pain, no dysuria, no weakness. Patient doesn't also noted some lower extremity edema. Patient has had a chronic cough ongoing for 2 years. Patient was seen in the emergency room chest x-ray which was done was concerning for CHF, BNP which was obtained was elevated at 697.2. First set of troponin was negative. EKG with no significant second ST-T wave abnormalities. Comprehensive metabolic profile is unremarkable. CBC had a white count of 11.4 hemoglobin of 10.8 with MCV of 104 and a platelet count of 281. Patient was given some nebulizer treatments and 80 however on ambulation patient was noted to desat into the 80s on room at. Was subsequently consulted to admit the patient for further evaluation and management. Patient was given some nebulizer treatment, steroids in the emergency room as well as a dose of IV Lasix with symptomatic improvement. We were asked to admit the patient for further evaluation and management.  Review of Systems: The patient denies anorexia, fever, weight loss,, vision loss, decreased hearing, hoarseness, chest pain, syncope, dyspnea on exertion, peripheral edema, balance deficits, hemoptysis, abdominal pain, melena,  hematochezia, severe indigestion/heartburn, hematuria, incontinence, genital sores, muscle weakness, suspicious skin lesions, transient blindness, difficulty walking, depression, unusual weight change, abnormal bleeding, enlarged lymph nodes, angioedema, and breast masses.    Past Medical History  Diagnosis Date  . Depression   . Ejection fraction     EF 40%, echo, November, 2012  / in improved, EF 60%, echo, December, 2012  . Contrast media allergy     Patient feels poorly with contrast  . Status post AAA (abdominal aortic aneurysm) repair     Surgical repair, Dr. Hart Rochester, December 27, 2011  . Pre-syncope     vasovagal w/ heart block  . Parotid mass     has refused further eval 10/2011  . GERD (gastroesophageal reflux disease)   . Anxiety   . Facial tic     L sided spasms - Botox trial summer 2013, ?effective  . Macular degeneration   . AAA (abdominal aortic aneurysm)     s/p repair 12/2011  . Congestive heart failure     NL EF 10/2011 echo   . Diabetes mellitus type II, controlled   . Hypothyroidism   . Sinus bradycardia 09/19/2011    Occurring simultaneously with complete heart block   . Hypertension   . COPD (chronic obstructive pulmonary disease)   . Abdominal spasms     hemifacial-treated w/ botox xeomin -Dr Terrace Arabia   Past Surgical History  Procedure Laterality Date  . Cholecystectomy    . Knee cartilage surgery      left  . Sinus surgery with instatrak    . Appendectomy    . Oophorectomy      ovarian cyst  . Cataract extraction  bilateral  . Bladder repair    . US echocardiography  10/14/11  . Abdominal aortic aneurysm repair  12/27/2011    Procedure: ANEURYSM ABDOMINAL AORTIC REPAIR;  Surgeon: Josephina Gip, MD;  Location: [facility] OR;  Service: Vascular;  Laterality: N/A;  Resection and Grafting Abdominal Aortic Aneurysm , Aorta Bi Iliac.  Marland Kitchen Pacemaker insertion  11/12  . Cardiac catheterization  11/04/11   Social History:  reports that she quit smoking about 22 months  ago. Her smoking use included Cigarettes. She has a 30 pack-year smoking history. She has never used smokeless tobacco. She reports that she does not drink alcohol or use illicit drugs.  Allergies  Allergen Reactions  . Bactrim [Sulfamethoxazole-Tmp Ds]   . Contrast Media [Iodinated Diagnostic Agents] Anaphylaxis  . Gadolinium Derivatives   . Iohexol Anaphylaxis, Shortness Of Breath and Swelling     Desc: PT STATES SHE HAD A SEVERE REACTION TO IV CONRAST WITH THROAT SWELLING AND SOB. SHE WAS ADMITTED TO THE HOSPITAL. SHE HAS NEVER HAD CONTRAST AGAIN.   Marland Kitchen Penicillins Rash  . Sulfa Antibiotics Anaphylaxis  . Avelox [Moxifloxacin Hcl In Nacl] Rash  . Ciprofloxacin Itching  . Delsym [Dextromethorphan] Other (See Comments)    Hallucination  . Hydromet [Hydrocodone-Homatropine] Other (See Comments)    Pt states med make her hallucinate    Family History  Problem Relation Age of Onset  . Esophageal cancer Father   . Cancer Father   . Breast cancer Sister   . Cancer Sister   . Colon cancer Sister   . Heart disease Sister   . Heart disease Mother   . Heart disease Son     Prior to Admission medications   Medication Sig Start Date End Date Taking? Authorizing Provider  albuterol (PROVENTIL HFA;VENTOLIN HFA) 108 (90 BASE) MCG/ACT inhaler Inhale 2 puffs into the lungs every 6 (six) hours as needed for wheezing. 04/01/13  Yes Newt Lukes, MD  ALPRAZolam Prudy Feeler) 1 MG tablet Take 1 mg by mouth 3 (three) times daily as needed for anxiety.   Yes Historical Provider, MD  aspirin EC 81 MG tablet Take 81 mg by mouth every morning.    Yes Historical Provider, MD  baclofen (LIORESAL) 10 MG tablet Take 5-10 mg by mouth 2 (two) times daily as needed (for muscle spasms).   Yes Historical Provider, MD  calcium carbonate (OS-CAL) 600 MG TABS Take 600 mg by mouth every other day.    Yes Historical Provider, MD  cetirizine (ZYRTEC) 10 MG tablet Take 10 mg by mouth daily as needed for allergies.   Yes  Historical Provider, MD  Cholecalciferol (VITAMIN D3) 5000 UNITS TABS Take 1 tablet by mouth every other day.    Yes Historical Provider, MD  furosemide (LASIX) 20 MG tablet Take 20 mg by mouth daily.  03/04/13  Yes Historical Provider, MD  guaiFENesin (MUCINEX) 600 MG 12 hr tablet Take 1 tablet (600 mg total) by mouth 2 (two) times daily. 06/30/13  Yes Newt Lukes, MD  Ipratropium-Albuterol (COMBIVENT) 20-100 MCG/ACT AERS respimat Inhale 1 puff into the lungs every 6 (six) hours as needed for wheezing or shortness of breath. 07/01/13  Yes Leslye Peer, MD  levothyroxine (SYNTHROID, LEVOTHROID) 175 MCG tablet Take 1 tablet (175 mcg total) by mouth daily. 03/26/13  Yes Newt Lukes, MD  losartan-hydrochlorothiazide (HYZAAR) 50-12.5 MG per tablet Take 1 tablet by mouth daily.  03/17/13  Yes Historical Provider, MD  metFORMIN (GLUCOPHAGE-XR) 500 MG 24 hr tablet Take  1 tablet (500 mg total) by mouth daily with breakfast. 04/08/13  Yes Nicki Reaper, NP  ondansetron (ZOFRAN) 4 MG tablet Take 1 tablet (4 mg total) by mouth every 8 (eight) hours as needed for nausea. 11/10/12  Yes Newt Lukes, MD  pantoprazole (PROTONIX) 40 MG tablet Take 1 tablet (40 mg total) by mouth daily. 03/17/13  Yes Newt Lukes, MD  potassium chloride SA (K-DUR,KLOR-CON) 20 MEQ tablet Take 1 tablet (20 mEq total) by mouth daily. 03/15/13  Yes Newt Lukes, MD  sertraline (ZOLOFT) 50 MG tablet Take 1 tablet (50 mg total) by mouth daily. 02/16/13  Yes Newt Lukes, MD   Physical Exam: Filed Vitals:   07/11/13 1326  BP: 142/57  Pulse: 104  Temp:   Resp: 20     General:  Obese, well-developed well-nourished in no acute cardiopulmonary distress.  Eyes: Pupils equal round and reactive to light and accommodation. Extraocular movements intact.  ENT: Oropharynx is clear, no lesions, no exudates.   Neck: Supple with no lymphadenopathy.  Cardiovascular:  Tachycardic regular rhythm  Respiratory:  Bibasilar crackles. Some scattered minimal wheezing.  Abdomen: Nondistended, soft, nontender to palpation.  Skin: No rashes or lesions  Musculoskeletal: 4/5 BUE strength, 4/5 BLE strength  Psychiatric: Normal mood. Normal affect. Good insight. Good judgment.  Neurologic: Alert and oriented x3. Cranial nerves II through XII are grossly intact. No focal deficits.  Labs on Admission:  Basic Metabolic Panel:  Recent Labs Lab 07/11/13 1000  NA 139  K 4.0  CL 105  CO2 29  GLUCOSE 123*  BUN 8  CREATININE 0.84  CALCIUM 9.5   Liver Function Tests:  Recent Labs Lab 07/11/13 1000  AST 21  ALT 17  ALKPHOS 48  BILITOT 0.5  PROT 6.3  ALBUMIN 3.7    Recent Labs Lab 07/11/13 1000  LIPASE 32   No results found for this basename: AMMONIA,  in the last 168 hours CBC:  Recent Labs Lab 07/11/13 1000  WBC 11.4*  NEUTROABS 7.8*  HGB 10.8*  HCT 33.2*  MCV 104.1*  PLT 281   Cardiac Enzymes:  Recent Labs Lab 07/11/13 1000  TROPONINI <0.30    BNP (last 3 results)  Recent Labs  01/02/13 1628 04/16/13 0211 07/11/13 1000  PROBNP 725.1* 293.2 697.2*   CBG: No results found for this basename: GLUCAP,  in the last 168 hours  Radiological Exams on Admission: Dg Chest 2 View  07/11/2013   *RADIOLOGY REPORT*  Clinical Data: Wheezing.  CHEST - 2 VIEW  Comparison: Chest x-ray 05/30/2013.  Findings: Lung volumes are normal.  No consolidative airspace disease.  Small bilateral pleural effusions.  Calcified granuloma in the left lower lobe. There is cephalization of the pulmonary vasculature and slight indistinctness of the interstitial markings suggestive of mild pulmonary edema.  Mild cardiomegaly. The patient is rotated to the right on today's exam, resulting in distortion of the mediastinal contours and reduced diagnostic sensitivity and specificity for mediastinal pathology.  Atherosclerosis in the thoracic aorta.  IMPRESSION: 1.  The appearance the chest suggest mild  congestive heart failure, as above. 2.  Atherosclerosis.   Original Report Authenticated By: Trudie Reed, M.D.    EKG: Independently reviewed. Normal sinus rhythm with first-degree AV block.  Assessment/Plan Principal Problem:   Hypoxia Active Problems:   COPD (chronic obstructive pulmonary disease) with emphysema   Diabetes mellitus type II, controlled   Hypothyroidism   Depression   Anxiety   Hypertension   Leukocytosis, unspecified  Lumbago   CHF, acute on chronic   #1 Acute hypoxia Likely secondary to acute on chronic CHF exacerbation likely secondary to medical noncompliance as patient stated went to the mountains and did not take her Lasix with her last 3 days versus acute COPD exacerbation which I doubt. Admit to telemetry. Cycle cardiac enzymes every 6 hours x3. Check a TSH. Check a 2-D echo. Place on Lasix 40 mg IV every 12 hours and continue Cozaar her home dose. Will start patient on low-dose Coreg. Continue aspirin. Strict eyes and nose. Daily weights. Will follow.  #2 probable acute on chronic CHF exacerbation Likely secondary to medical noncompliance as patient had gone to the mountains to 3 days prior to admission and did not take her Lasix secondary to increased urination. BNP is elevated. EKG with no significant ST-T wave abnormalities. Will admit to telemetry. Strict I.'s and O.'s. Daily weights. Cycle cardiac enzymes every 6 hours x3. Check a TSH. Check a 2-D echo. Continue Cozaar, low-dose Coreg.  #3 hypertension Continue Cozaar.  #4 hypothyroidism Check a TSH. Continue on the Synthroid.  #5 depression/anxiety Continue Zoloft and Xanax as needed.  #6 leukocytosis Likely a chronic leukocytosis. Will check a UA with cultures and sensitivities. Check a chest x-ray.  #7 type 2 diabetes Check a hemoglobin A1c. Additional sliding scale insulin.  #8 prophylaxis PPI for GI prophylaxis. Lovenox for DVT prophylaxis.  Code Status: DO NOT RESUSCITATE Family  Communication:  Updated patient and husband at bedside Disposition Plan: Admit to telemetry  Time spent: 65 minutes  [facility] Pager [phone]  If 7PM-7AM, please contact night-coverage [email or web address] Password [facility] 07/11/2013, 2:44 PM

## 2013-07-11 NOTE — ED Notes (Signed)
Md [name] in to eval pt

## 2013-07-12 ENCOUNTER — Inpatient Hospital Stay (HOSPITAL_COMMUNITY): Payer: Medicare Other

## 2013-07-12 DIAGNOSIS — I517 Cardiomegaly: Secondary | ICD-10-CM

## 2013-07-12 DIAGNOSIS — E119 Type 2 diabetes mellitus without complications: Secondary | ICD-10-CM

## 2013-07-12 LAB — CBC WITH DIFFERENTIAL/PLATELET
Basophils Relative: 0 % (ref 0–1)
Eosinophils Absolute: 0.2 10*3/uL (ref 0.0–0.7)
Eosinophils Relative: 1 % (ref 0–5)
HCT: 33.9 % — ABNORMAL LOW (ref 36.0–46.0)
Hemoglobin: 11 g/dL — ABNORMAL LOW (ref 12.0–15.0)
Lymphs Abs: 2.3 10*3/uL (ref 0.7–4.0)
MCH: 33.4 pg (ref 26.0–34.0)
MCHC: 32.4 g/dL (ref 30.0–36.0)
MCV: 103 fL — ABNORMAL HIGH (ref 78.0–100.0)
Monocytes Absolute: 1.2 10*3/uL — ABNORMAL HIGH (ref 0.1–1.0)
Neutro Abs: 11.9 10*3/uL — ABNORMAL HIGH (ref 1.7–7.7)
Neutrophils Relative %: 76 % (ref 43–77)

## 2013-07-12 LAB — BASIC METABOLIC PANEL
BUN: 12 mg/dL (ref 6–23)
Chloride: 100 mEq/L (ref 96–112)
Creatinine, Ser: 0.96 mg/dL (ref 0.50–1.10)
GFR calc Af Amer: 61 mL/min — ABNORMAL LOW (ref >90)
Glucose, Bld: 129 mg/dL — ABNORMAL HIGH (ref 70–99)

## 2013-07-12 LAB — TROPONIN I: Troponin I: <0.3 ng/mL (ref <0.30)

## 2013-07-12 LAB — GLUCOSE, CAPILLARY
Glucose-Capillary: 111 mg/dL — ABNORMAL HIGH (ref 70–99)
Glucose-Capillary: 136 mg/dL — ABNORMAL HIGH (ref 70–99)
Glucose-Capillary: 133 mg/dL — ABNORMAL HIGH (ref 70–99)
Glucose-Capillary: 134 mg/dL — ABNORMAL HIGH (ref 70–99)

## 2013-07-12 MED ORDER — FUROSEMIDE 10 MG/ML IJ SOLN
20.0000 mg | Freq: Two times a day (BID) | INTRAMUSCULAR | Status: DC
  Administered 2013-07-12 – 2013-07-13 (×2): 20 mg via INTRAVENOUS
  Filled 2013-07-12 (×4): qty 2

## 2013-07-12 MED ORDER — LEVALBUTEROL HCL 0.63 MG/3ML IN NEBU: 0.6300 mg | INHALATION_SOLUTION | Freq: Four times a day (QID) | RESPIRATORY_TRACT | Status: DC | PRN

## 2013-07-12 MED ORDER — SIMETHICONE 80 MG PO CHEW
160.0000 mg | CHEWABLE_TABLET | Freq: Four times a day (QID) | ORAL | Status: DC | PRN
  Administered 2013-07-12: 160 mg via ORAL
  Filled 2013-07-12 (×2): qty 2

## 2013-07-12 MED ORDER — BISACODYL 10 MG RE SUPP
10.0000 mg | Freq: Every day | RECTAL | Status: DC | PRN
  Administered 2013-07-12: 22:00:00 10 mg via RECTAL
  Filled 2013-07-12: qty 1

## 2013-07-12 NOTE — Progress Notes (Addendum)
Inpatient Diabetes Program Recommendations  AACE/ADA: New Consensus Statement on Inpatient Glycemic Control (2013)  Target Ranges:  Prepandial:   less than 140 mg/dL      Peak postprandial:   less than 180 mg/dL (1-2 hours)      Critically ill patients:  140 - 180 mg/dL   Reason for Visit: Consult - Diabetes  Pt states she checked her blood sugars at home, but hasn't been able to read meter and this has been a source of frustration.  Husband states pt does not want him to assist her with monitoring.  Denies any hypoglycemia.  On metformin XR 500 mg at home.  Results for ZANDRIA, WOLDT (MRN [id number]) as of 07/12/2013 12:26  Ref. Range 07/11/2013 10:00  Hemoglobin A1C Latest Range: <5.7 % 6.6 (H)  Results for CORTINA, VULTAGGIO (MRN [id number]) as of 07/12/2013 12:26  Ref. Range 07/11/2013 16:58 07/11/2013 20:36 07/12/2013 07:29 07/12/2013 11:30  Glucose-Capillary Latest Range: 70-99 mg/dL 657 (H) 846 (H) 962 (H) 136 (H)    HgbA1C of 6.6% indicative of good control at home.  Hyperglycemia high yesterday most likely d/t steriods - Prednisone 60 mg.  Gave name of talking glucose meter and instructed pt to talk with pharmacist at [facility] about it.  Medicare should cover this, along with supplies and lancets. Answered questions.  Thank you. Ailene Ards, RD, LDN, CDE Inpatient Diabetes Coordinator [phone]  Addendum:  Please add CHO mod medium to heart healthy diet.

## 2013-07-12 NOTE — Progress Notes (Signed)
  Echocardiogram 2D Echocardiogram has been performed.  Paula [name] FRANCES 07/12/2013, 10:23 AM

## 2013-07-12 NOTE — Progress Notes (Signed)
Saturations 97% post ambulation, pt needs instructions to deep breath.

## 2013-07-12 NOTE — Progress Notes (Signed)
[facility] PROGRESS NOTE  Paula [name] ZOX:[id number] DOB: February 26, 1927 DOA: 07/11/2013 PCP: Rene Paci, MD  Assessment/Plan: #1 hypoxia/acute on chronic CHF exacerbation Secondary to patient not taking her medications when she went on a 2 to three-day trip into the mountains. Clinical improvement. Cardiac enzymes negative x3. 2-D echo is pending. I./o equal -760. Patient's weight is 80.5 kg today from 81.1 kg yesterday. Change Lasix to 20 mg IV every 12. Continue Cozaar and low-dose Coreg. Follow.  #2 hypertension Continue home dose cozaar. Patient started on low-dose Coreg.  #3 hypothyroidism TSH is 0.256. Check a free T4 ,T3. Continue current dose of Synthroid. Followup with PCP as outpatient.  #4 depression/anxiety Continue Zoloft and Xanax.  #5 leukocytosis Likely secondary to prednisone received in the ED. Urinalysis is negative. Chest x-ray is negative.  #6 well controlled type 2 diabetes Hemoglobin A1c 6.6. CBGs have ranged from 111 - 198. Continue sliding scale insulin.  #7 prophylaxis Lovenox for DVT prophylaxis.   Code Status: DO NOT RESUSCITATE Family Communication: Updated patient and husband at bedside. Disposition Plan: Home when medically stable.   Consultants:  None  Procedures:  CXR 07/11/13  Antibiotics:  None  HPI/Subjective: Patient states wheezing improved. SOB improved. No complaints. Wants to go home.  Objective: Filed Vitals:   07/12/13 1308  BP: 139/58  Pulse: 99  Temp: 98.5 F (36.9 C)  Resp: 18    Intake/Output Summary (Last 24 hours) at 07/12/13 1359 Last data filed at 07/12/13 1352  Gross per 24 hour  Intake    240 ml  Output   2050 ml  Net  -1810 ml   Filed Weights   07/11/13 1515 07/12/13 0500  Weight: 81.1 kg (178 lb 12.7 oz) 80.5 kg (177 lb 7.5 oz)    Exam:   General:  NAD  Cardiovascular: RRR  Respiratory: CTAB  Abdomen: Soft/NT/ND/+BS  Musculoskeletal: No c/c/ trace-1+ BLE edema  Data  Reviewed: Basic Metabolic Panel:  Recent Labs Lab 07/11/13 1000 07/11/13 1532 07/12/13 0305  NA 139  --  139  K 4.0  --  3.6  CL 105  --  100  CO2 29  --  31  GLUCOSE 123*  --  129*  BUN 8  --  12  CREATININE 0.84 0.93 0.96  CALCIUM 9.5  --  9.3  MG  --  2.1  --    Liver Function Tests:  Recent Labs Lab 07/11/13 1000  AST 21  ALT 17  ALKPHOS 48  BILITOT 0.5  PROT 6.3  ALBUMIN 3.7    Recent Labs Lab 07/11/13 1000  LIPASE 32   No results found for this basename: AMMONIA,  in the last 168 hours CBC:  Recent Labs Lab 07/11/13 1000 07/12/13 0305  WBC 11.4* 15.6*  NEUTROABS 7.8* 11.9*  HGB 10.8* 11.0*  HCT 33.2* 33.9*  MCV 104.1* 103.0*  PLT 281 300   Cardiac Enzymes:  Recent Labs Lab 07/11/13 1000 07/11/13 1532 07/11/13 2051 07/12/13 0305  TROPONINI <0.30 <0.30 <0.30 <0.30   BNP (last 3 results)  Recent Labs  04/16/13 0211 07/11/13 1000 07/12/13 0305  PROBNP 293.2 697.2* 1369.0*   CBG:  Recent Labs Lab 07/11/13 1658 07/11/13 2036 07/12/13 0729 07/12/13 1130  GLUCAP 227* 198* 111* 136*    No results found for this or any previous visit (from the past 240 hour(s)).   Studies: Dg Chest 2 View  07/11/2013   *RADIOLOGY REPORT*  Clinical Data: Wheezing.  CHEST - 2 VIEW  Comparison: Chest x-ray 05/30/2013.  Findings: Lung volumes are normal.  No consolidative airspace disease.  Small bilateral pleural effusions.  Calcified granuloma in the left lower lobe. There is cephalization of the pulmonary vasculature and slight indistinctness of the interstitial markings suggestive of mild pulmonary edema.  Mild cardiomegaly. The patient is rotated to the right on today's exam, resulting in distortion of the mediastinal contours and reduced diagnostic sensitivity and specificity for mediastinal pathology.  Atherosclerosis in the thoracic aorta.  IMPRESSION: 1.  The appearance the chest suggest mild congestive heart failure, as above. 2.  Atherosclerosis.    Original Report Authenticated By: Trudie Reed, M.D.    Scheduled Meds: . albuterol  5 mg Nebulization Once  . aspirin EC  81 mg Oral q morning - 10a  . carvedilol  3.125 mg Oral BID WC  . enoxaparin (LOVENOX) injection  40 mg Subcutaneous Q24H  . furosemide  40 mg Intravenous Q12H  . guaiFENesin  600 mg Oral BID  . insulin aspart  0-20 Units Subcutaneous TID WC  . levothyroxine  175 mcg Oral QAC breakfast  . loratadine  10 mg Oral Daily  . losartan  50 mg Oral Daily  . pantoprazole  40 mg Oral Daily  . potassium chloride SA  20 mEq Oral Daily  . sertraline  50 mg Oral Daily  . sodium chloride  3 mL Intravenous Q12H   Continuous Infusions:   Principal Problem:   Hypoxia Active Problems:   COPD (chronic obstructive pulmonary disease) with emphysema   Diabetes mellitus type II, controlled   Hypothyroidism   Depression   Anxiety   Hypertension   Leukocytosis, unspecified   Lumbago   CHF, acute on chronic    Time spent: > 30 mins    [facility]  [facility] Pager [phone]. If 7PM-7AM, please contact night-coverage at [email or web address], password [facility] 07/12/2013, 1:59 PM  LOS: 1 day

## 2013-07-13 DIAGNOSIS — F411 Generalized anxiety disorder: Secondary | ICD-10-CM

## 2013-07-13 LAB — BASIC METABOLIC PANEL
BUN: 14 mg/dL (ref 6–23)
CO2: 29 mEq/L (ref 19–32)
Glucose, Bld: 155 mg/dL — ABNORMAL HIGH (ref 70–99)
Potassium: 3.5 mEq/L (ref 3.5–5.1)
Sodium: 138 mEq/L (ref 135–145)

## 2013-07-13 LAB — GLUCOSE, CAPILLARY: Glucose-Capillary: 112 mg/dL — ABNORMAL HIGH (ref 70–99)

## 2013-07-13 MED ORDER — POLYETHYLENE GLYCOL 3350 17 G PO PACK
17.0000 g | PACK | Freq: Every day | ORAL | Status: DC
  Administered 2013-07-13: 08:00:00 17 g via ORAL
  Filled 2013-07-13: qty 1

## 2013-07-13 MED ORDER — SIMETHICONE 80 MG PO CHEW: 160.0000 mg | CHEWABLE_TABLET | Freq: Four times a day (QID) | ORAL | Status: DC | PRN

## 2013-07-13 MED ORDER — LORAZEPAM 2 MG/ML IJ SOLN
1.0000 mg | Freq: Once | INTRAMUSCULAR | Status: AC
  Administered 2013-07-13: 04:00:00 1 mg via INTRAVENOUS
  Filled 2013-07-13: qty 1

## 2013-07-13 MED ORDER — FUROSEMIDE 20 MG PO TABS: 20.0000 mg | ORAL_TABLET | Freq: Every day | ORAL | Status: DC

## 2013-07-13 MED ORDER — CARVEDILOL 3.125 MG PO TABS: 3.1250 mg | ORAL_TABLET | Freq: Two times a day (BID) | ORAL | Status: DC

## 2013-07-13 NOTE — Progress Notes (Signed)
Page to MD per pt and husband's request. They desire to be discharged home. Writer can not dissimilate if this is patient's base line mentation or if she is Lobbyist. She insist on walking hallway, which she is doing well with standby assist. She does have a fluctuating course of thinking and is fixated on going home.

## 2013-07-13 NOTE — Progress Notes (Signed)
Pt with moderate confusion about events of the night. Nurse reports that patient awoke at 1am and has chatted most of night. Nurse gains from husband "Nadine Counts" who is at the bedside that patient is "missing" her regular adm of xanax. Nurse did adm the ordered dose during the night. Otherwise pt is ambulating well, and eating well. Will observe closely.

## 2013-07-13 NOTE — Progress Notes (Signed)
Abdomen distended, moderate relief from docalax pr. Pt very restless/anxious throughout shift, moderate relief noted with prn xanax/ativan

## 2013-07-13 NOTE — Progress Notes (Signed)
Dr. [name] here to dc pt. She and her husband verbalize understanding of dc instructions.

## 2013-07-13 NOTE — Discharge Summary (Signed)
Physician Discharge Summary  Paula [name] NWG:[id number] DOB: 06-26-1927 DOA: 07/11/2013  PCP: Rene Paci, MD  Admit date: 07/11/2013 Discharge date: 07/13/2013  Time spent: 60 minutes  Recommendations for Outpatient Follow-up:  1. Patient will followup with Rene Paci, MD in 1 week. On followup and a basic metabolic profile needs to be obtained to followup on patient's electrolytes and renal function. Patient's volume status will need to be assessed. Patient may need referral to cardiology as outpatient for followup on her CHF.  Discharge Diagnoses:  Principal Problem:   Hypoxia Active Problems:   COPD (chronic obstructive pulmonary disease) with emphysema   Diabetes mellitus type II, controlled   Hypothyroidism   Depression   Anxiety   Hypertension   Leukocytosis, unspecified   Lumbago   CHF, acute on chronic   Discharge Condition: Stable and improved  Diet recommendation: Low sodium diet  Filed Weights   07/11/13 1515 07/12/13 0500 07/13/13 0506  Weight: 81.1 kg (178 lb 12.7 oz) 80.5 kg (177 lb 7.5 oz) 81.4 kg (179 lb 7.3 oz)    History of present illness:  Paula [name] is a 79 y.o. female  With history of CHF last 2-D echo EF of 60% December 2012, diabetes, hypertension, hypothyroidism, prior history of tobacco abuse, who presents to the ED with a three-day history of worsening wheezing. Patient stated they just got [place] from the mountains with her husband and went to [facility] however patient did not take her Lasix during this 2-3 day trip secondary to increased urination. Patient denied any chest pain, no shortness of breath, no fever, no chills, no vomiting, no abdominal pain, no dysuria, no weakness. Patient doesn't also noted some lower extremity edema. Patient has had a chronic cough ongoing for 2 years.  Patient was seen in the emergency room chest x-ray which was done was concerning for CHF, BNP which was obtained was elevated at 697.2. First set of troponin  was negative. EKG with no significant second ST-T wave abnormalities. Comprehensive metabolic profile is unremarkable. CBC had a white count of 11.4 hemoglobin of 10.8 with MCV of 104 and a platelet count of 281. Patient was given some nebulizer treatments and 80 however on ambulation patient was noted to desat into the 80s on room at. Was subsequently consulted to admit the patient for further evaluation and management. Patient was given some nebulizer treatment, steroids in the emergency room as well as a dose of IV Lasix with symptomatic improvement. We were asked to admit the patient for further evaluation and management.   Hospital Course:  #1 hypoxia/acute on chronic CHF exacerbation  Patient was admitted with wheezing and noted to be hypoxic with sats in the 80s on ambulation in the emergency room and a such patient was subsequently admitted for workup. In the ED due to concerns for COPD exacerbation patient was given some nebulizer treatments also noted to have an elevated BNP of 697.2 and given a dose of IV Lasix. Patient was admitted to telemetry floor and placed on IV Lasix. It was felt patient's hypoxia was secondary to an acute on chronic CHF exacerbation secondary to patient not taking her medications when she went on a 2 to three-day trip into the mountains. Patient improved clinically on a daily basis throughout the hospitalization. Cardiac enzymes negative x3. 2-D echo obtained at the EF of 55-60%, normal systolic function, wall thickness increase in the pattern of moderate LVH and regional wall motion was difficult to accurately assess but the basal anterior  and anterolateral walls may have been mildly hypokinetic. Patient did not have any chest pain. Patient diuresed well. Patient was subsequently transitioned to oral Lasix maintained on low-dose Coreg and cozaar. On day of discharge patient was satting 96% on room air on ambulation. Patient was discharged in stable and improved condition to  followup with PCP as outpatient.  #2 hypertension  Patient's blood pressure remained stable throughout the hospitalization. Patient was started on low-dose Coreg secondary to acute on chronic CHF exacerbation. Patient was also maintained on a home dose of Cozaar. Patient will followup with PCP as outpatient. #3 hypothyroidism  TSH is 0.256. Free T4 and T3 were ordered however results were pending at the time of discharge. Patient was maintained on a home dose of Synthroid and will need to followup with PCP as outpatient.  #4 depression/anxiety  Continued on Zoloft and Xanax.  #5 leukocytosis  Likely secondary to prednisone received in the ED. Urinalysis is negative. Chest x-ray is negative. Patient remained afebrile and will followup with PCP as outpatient. #6 well controlled type 2 diabetes  Hemoglobin A1c 6.6. Patient was maintained on sliding scale throughout the hospitalization.      Procedures: CXR 07/11/13 2-D echo 07/12/2013   Consultations:  None  Discharge Exam: Filed Vitals:   07/13/13 1300  BP: 114/58  Pulse: 85  Temp: 98.9 F (37.2 C)  Resp: 18    General: NAD Cardiovascular: RRR Respiratory: CTAB  Discharge Instructions  Discharge Orders   Future Appointments Provider Department Dept Phone   08/03/2013 9:30 AM Oretha Milch, MD [facility] [phone]   09/20/2013 9:30 AM Julio Sicks, NP [facility] [phone]   Future Orders Complete By Expires   Diet - low sodium heart healthy  As directed    Discharge instructions  As directed    Comments:     Take all medications as prescribed. Follow up with Rene Paci, MD in 1 week.   Increase activity slowly  As directed        Medication List         albuterol 108 (90 BASE) MCG/ACT inhaler  Commonly known as:  PROVENTIL HFA;VENTOLIN HFA  Inhale 2 puffs into the lungs every 6 (six) hours as needed for wheezing.     ALPRAZolam 1 MG tablet  Commonly known as:  XANAX   Take 1 mg by mouth 3 (three) times daily as needed for anxiety.     aspirin EC 81 MG tablet  Take 81 mg by mouth every morning.     baclofen 10 MG tablet  Commonly known as:  LIORESAL  Take 5-10 mg by mouth 2 (two) times daily as needed (for muscle spasms).     calcium carbonate 600 MG Tabs tablet  Commonly known as:  OS-CAL  Take 600 mg by mouth every other day.     carvedilol 3.125 MG tablet  Commonly known as:  COREG  Take 1 tablet (3.125 mg total) by mouth 2 (two) times daily with a meal.     cetirizine 10 MG tablet  Commonly known as:  ZYRTEC  Take 10 mg by mouth daily as needed for allergies.     furosemide 20 MG tablet  Commonly known as:  LASIX  Take 1 tablet (20 mg total) by mouth daily.     guaiFENesin 600 MG 12 hr tablet  Commonly known as:  MUCINEX  Take 1 tablet (600 mg total) by mouth 2 (two) times daily.  Ipratropium-Albuterol 20-100 MCG/ACT Aers respimat  Commonly known as:  COMBIVENT  Inhale 1 puff into the lungs every 6 (six) hours as needed for wheezing or shortness of breath.     levothyroxine 175 MCG tablet  Commonly known as:  SYNTHROID, LEVOTHROID  Take 1 tablet (175 mcg total) by mouth daily.     losartan-hydrochlorothiazide 50-12.5 MG per tablet  Commonly known as:  HYZAAR  Take 1 tablet by mouth daily.     metFORMIN 500 MG 24 hr tablet  Commonly known as:  GLUCOPHAGE-XR  Take 1 tablet (500 mg total) by mouth daily with breakfast.     ondansetron 4 MG tablet  Commonly known as:  ZOFRAN  Take 1 tablet (4 mg total) by mouth every 8 (eight) hours as needed for nausea.     pantoprazole 40 MG tablet  Commonly known as:  PROTONIX  Take 1 tablet (40 mg total) by mouth daily.     potassium chloride SA 20 MEQ tablet  Commonly known as:  K-DUR,KLOR-CON  Take 1 tablet (20 mEq total) by mouth daily.     sertraline 50 MG tablet  Commonly known as:  ZOLOFT  Take 1 tablet (50 mg total) by mouth daily.     simethicone 80 MG chewable tablet   Commonly known as:  MYLICON  Chew 2 tablets (160 mg total) by mouth every 6 (six) hours as needed for flatulence.     Vitamin D3 5000 UNITS Tabs  Take 1 tablet by mouth every other day.       Allergies  Allergen Reactions  . Bactrim [Sulfamethoxazole-Tmp Ds]   . Contrast Media [Iodinated Diagnostic Agents] Anaphylaxis  . Gadolinium Derivatives   . Iohexol Anaphylaxis, Shortness Of Breath and Swelling     Desc: PT STATES SHE HAD A SEVERE REACTION TO IV CONRAST WITH THROAT SWELLING AND SOB. SHE WAS ADMITTED TO THE HOSPITAL. SHE HAS NEVER HAD CONTRAST AGAIN.   Marland Kitchen Penicillins Rash  . Sulfa Antibiotics Anaphylaxis  . Avelox [Moxifloxacin Hcl In Nacl] Rash  . Ciprofloxacin Itching  . Delsym [Dextromethorphan] Other (See Comments)    Hallucination  . Hydromet [Hydrocodone-Homatropine] Other (See Comments)    Pt states med make her hallucinate       Follow-up Information   Follow up with Rene Paci, MD. Schedule an appointment as soon as possible for a visit in 1 week.   Specialty:  Internal Medicine   Contact information:   [address] [place] [address] [phone]        The results of significant diagnostics from this hospitalization (including imaging, microbiology, ancillary and laboratory) are listed below for reference.    Significant Diagnostic Studies: Dg Chest 2 View  07/11/2013   *RADIOLOGY REPORT*  Clinical Data: Wheezing.  CHEST - 2 VIEW  Comparison: Chest x-ray 05/30/2013.  Findings: Lung volumes are normal.  No consolidative airspace disease.  Small bilateral pleural effusions.  Calcified granuloma in the left lower lobe. There is cephalization of the pulmonary vasculature and slight indistinctness of the interstitial markings suggestive of mild pulmonary edema.  Mild cardiomegaly. The patient is rotated to the right on today's exam, resulting in distortion of the mediastinal contours and reduced diagnostic sensitivity and  specificity for mediastinal pathology.  Atherosclerosis in the thoracic aorta.  IMPRESSION: 1.  The appearance the chest suggest mild congestive heart failure, as above. 2.  Atherosclerosis.   Original Report Authenticated By: Trudie Reed, M.D.   Dg  Abd 1 View  07/12/2013   *RADIOLOGY REPORT*  Clinical Data: Abdominal distention, nausea  ABDOMEN - 1 VIEW  Comparison: 03/04/2013  Findings: Prior cholecystectomy noted.  Postop changes throughout the pelvis and right lower quadrant.  Nonspecific bowel gas pattern without significant dilatation or ileus.  Degenerative changes of the spine.  Bones appear osteopenic.  IMPRESSION: Nonobstructive bowel gas pattern.   Original Report Authenticated By: Judie Petit. Miles Costain, M.D.    Microbiology: Recent Results (from the past 240 hour(s))  URINE CULTURE     Status: None   Collection Time    07/11/13  3:22 PM      Result Value Range Status   Specimen Description URINE, CATHETERIZED   Final   Special Requests NONE   Final   Culture  Setup Time     Final   Value: 07/11/2013 23:41     Performed at [facility] Count PENDING   Incomplete   Culture     Final   Value: Culture reincubated for better growth     Performed at [facility]   Report Status PENDING   Incomplete     Labs: Basic Metabolic Panel:  Recent Labs Lab 07/11/13 1000 07/11/13 1532 07/12/13 0305 07/13/13 0815  NA 139  --  139 138  K 4.0  --  3.6 3.5  CL 105  --  100 100  CO2 29  --  31 29  GLUCOSE 123*  --  129* 155*  BUN 8  --  12 14  CREATININE 0.84 0.93 0.96 0.87  CALCIUM 9.5  --  9.3 9.7  MG  --  2.1  --   --    Liver Function Tests:  Recent Labs Lab 07/11/13 1000  AST 21  ALT 17  ALKPHOS 48  BILITOT 0.5  PROT 6.3  ALBUMIN 3.7    Recent Labs Lab 07/11/13 1000  LIPASE 32   No results found for this basename: AMMONIA,  in the last 168 hours CBC:  Recent Labs Lab 07/11/13 1000 07/12/13 0305  WBC 11.4* 15.6*  NEUTROABS 7.8* 11.9*   HGB 10.8* 11.0*  HCT 33.2* 33.9*  MCV 104.1* 103.0*  PLT 281 300   Cardiac Enzymes:  Recent Labs Lab 07/11/13 1000 07/11/13 1532 07/11/13 2051 07/12/13 0305  TROPONINI <0.30 <0.30 <0.30 <0.30   BNP: BNP (last 3 results)  Recent Labs  04/16/13 0211 07/11/13 1000 07/12/13 0305  PROBNP 293.2 697.2* 1369.0*   CBG:  Recent Labs Lab 07/12/13 1130 07/12/13 1637 07/12/13 2319 07/13/13 0727 07/13/13 1144  GLUCAP 136* 133* 134* 137* 112*       Signed:  THOMPSON,DANIEL  [facility] 07/13/2013, 5:03 PM

## 2013-07-15 LAB — URINE CULTURE

## 2013-07-19 ENCOUNTER — Ambulatory Visit (INDEPENDENT_AMBULATORY_CARE_PROVIDER_SITE_OTHER): Payer: Medicare Other | Admitting: Internal Medicine

## 2013-07-19 ENCOUNTER — Encounter: Payer: Self-pay | Admitting: Internal Medicine

## 2013-07-19 ENCOUNTER — Other Ambulatory Visit (INDEPENDENT_AMBULATORY_CARE_PROVIDER_SITE_OTHER): Payer: Medicare Other

## 2013-07-19 VITALS — BP 130/80 | HR 83 | Temp 98.5°F | Wt 178.0 lb

## 2013-07-19 DIAGNOSIS — E119 Type 2 diabetes mellitus without complications: Secondary | ICD-10-CM

## 2013-07-19 DIAGNOSIS — J438 Other emphysema: Secondary | ICD-10-CM

## 2013-07-19 DIAGNOSIS — J439 Emphysema, unspecified: Secondary | ICD-10-CM

## 2013-07-19 DIAGNOSIS — I5032 Chronic diastolic (congestive) heart failure: Secondary | ICD-10-CM

## 2013-07-19 DIAGNOSIS — I509 Heart failure, unspecified: Secondary | ICD-10-CM

## 2013-07-19 LAB — CBC WITH DIFFERENTIAL/PLATELET
Basophils Absolute: 0.1 10*3/uL (ref 0.0–0.1)
Eosinophils Relative: 4.1 % (ref 0.0–5.0)
Monocytes Absolute: 0.6 10*3/uL (ref 0.1–1.0)
Monocytes Relative: 5.3 % (ref 3.0–12.0)
Neutrophils Relative %: 69.2 % (ref 43.0–77.0)
Platelets: 241 10*3/uL (ref 150.0–400.0)
RDW: 14.7 % — ABNORMAL HIGH (ref 11.5–14.6)
WBC: 12 10*3/uL — ABNORMAL HIGH (ref 4.5–10.5)

## 2013-07-19 LAB — BASIC METABOLIC PANEL
CO2: 30 mEq/L (ref 19–32)
Calcium: 9.4 mg/dL (ref 8.4–10.5)
Chloride: 106 mEq/L (ref 96–112)
GFR: 61.75 mL/min (ref >60.00)
Potassium: 4.1 mEq/L (ref 3.5–5.1)
Sodium: 142 mEq/L (ref 135–145)

## 2013-07-19 MED ORDER — METFORMIN HCL ER 500 MG PO TB24: 500.0000 mg | ORAL_TABLET | Freq: Every day | ORAL | Status: DC

## 2013-07-19 MED ORDER — CARVEDILOL 3.125 MG PO TABS: 3.1250 mg | ORAL_TABLET | Freq: Two times a day (BID) | ORAL | Status: DC

## 2013-07-19 NOTE — Patient Instructions (Signed)
It was good to see you today. We have reviewed your prior records including labs and tests today Medications reviewed and updated:  Refill on medication(s) as discussed today. Test(s) ordered today. Your results will be released to MyChart (or called to you) after review, usually within 72hours after test completion. If any changes need to be made, you will be notified at that same time. Followup as scheduled (3 months), call sooner if problems

## 2013-07-19 NOTE — Assessment & Plan Note (Signed)
Chronic dyspnea on exertion and sensation of "phlegm" chronic bronchitis with chronic cough symptoms also overlap with chronic diastolic HF - currently euvolemic Complicated by medical compliance/confusion and lack of belief in medication efficacy driving noncompliance (suspect mild dementia of spouse AND patient) Also pt has macular degeneration so depends on spouse to supply her meds No clinical evidence for infection or CHF on exam   PFTs normal 03/19/13 (2D echo 07/2013 normal LVEF, grade 1 diastolic dysfx)

## 2013-07-19 NOTE — Progress Notes (Signed)
Subjective:    Patient ID: Paula [name], female    DOB: 1926-12-16, 79 y.o.   MRN: [id number]  HPI  Here for hospital followup -discharge summary and Hospital course reviewed August 31 through September 2: Recommendations for Outpatient Follow-up:  1. Patient will followup with Rene Paci, MD in 1 week. On followup and a basic metabolic profile needs to be obtained to followup on patient's electrolytes and renal function. Patient's volume status will need to be assessed. Patient may need referral to cardiology as outpatient for followup on her CHF. Discharge Diagnoses:  Principal Problem:  Hypoxia  Active Problems:  COPD (chronic obstructive pulmonary disease) with emphysema  Diabetes mellitus type II, controlled  Hypothyroidism  Depression  Anxiety  Hypertension  Leukocytosis, unspecified  Lumbago  CHF, acute on chronic   Since home, has been taking diuretic as prescribed. Denies change in chronic dyspnea, improved compared to preadmission symptoms Also reviewed chronic medical issues and medications  Past Medical History  Diagnosis Date  . Depression   . Ejection fraction     EF 40%, echo, November, 2012  / in improved, EF 60%, echo, December, 2012  . Contrast media allergy     Patient feels poorly with contrast  . Status post AAA (abdominal aortic aneurysm) repair     Surgical repair, Dr. Hart [place], December 27, 2011  . Pre-syncope     vasovagal w/ heart block  . Parotid mass     has refused further eval 10/2011  . GERD (gastroesophageal reflux disease)   . Anxiety   . Facial tic     L sided spasms - Botox trial summer 2013, ?effective  . Macular degeneration   . AAA (abdominal aortic aneurysm)     s/p repair 12/2011  . Congestive heart failure     NL EF 10/2011 echo   . Diabetes mellitus type II, controlled   . Hypothyroidism   . Sinus bradycardia 09/19/2011    Occurring simultaneously with complete heart block   . Hypertension   . COPD (chronic  obstructive pulmonary disease)   . Abdominal spasms     hemifacial-treated w/ botox xeomin -Dr Terrace Arabia    Review of Systems  Constitutional: Positive for malaise/fatigue. Negative for fever and weight loss.  HENT: Positive for hoarse voice. Negative for ear pain, sore throat, rhinorrhea, sneezing, trouble swallowing and postnasal drip.   Respiratory: Positive for cough (chronic) and shortness of breath (chronic).   Cardiovascular: Positive for dyspnea on exertion.  Gastrointestinal: Negative for heartburn.  Musculoskeletal: Positive for gait problem. Negative for myalgias and joint swelling.  Neurological: Positive for speech difficulty (chronic "drawing up of mouth", follows with neuro for same) and weakness. Negative for headaches.  Psychiatric/Behavioral: Negative for sleep disturbance and dysphoric mood.       Objective:   Physical Exam BP 130/80  Pulse 83  Temp(Src) 98.5 F (36.9 C) (Oral)  Wt 178 lb (80.74 kg)  BMI 29.62 kg/m2  SpO2 93% Wt Readings from Last 3 Encounters:  07/19/13 178 lb (80.74 kg)  07/13/13 179 lb 7.3 oz (81.4 kg)  06/30/13 178 lb 12.8 oz (81.103 kg)   Gen: NAD, nontoxic. hoarse without change. Spouse at side Lungs: CTA B - no wheeze but diminished air movement at bases CV: RRR, no edema BLE MSkel: Back: patient is able to heel toe walk with use of walker and ambulates with cautious gait. No gross deformity or joint swelling Psychiatric: tangential    Lab Results  Component Value Date  WBC 15.6* 07/12/2013   HGB 11.0* 07/12/2013   HCT 33.9* 07/12/2013   PLT 300 07/12/2013   GLUCOSE 155* 07/13/2013   CHOL 149 02/10/2013   TRIG 218.0* 02/10/2013   HDL 30.10* 02/10/2013   LDLDIRECT 90.6 02/10/2013   LDLCALC 80 11/04/2011   ALT 17 07/11/2013   AST 21 07/11/2013   NA 138 07/13/2013   K 3.5 07/13/2013   CL 100 07/13/2013   CREATININE 0.87 07/13/2013   BUN 14 07/13/2013   CO2 29 07/13/2013   TSH 0.256* 07/11/2013   INR 1.11 07/11/2013   HGBA1C 6.6* 07/11/2013   2D Echo  07/12/13 (IP): Study Conclusions  - Left ventricle: Regional wall motion was difficult to accurately assess, but the basal anteriorand anterolateral walls may have been mildly hypokinetic. Wall thickness was increased in a pattern of mild LVH. Systolic function was normal. The estimated ejection fraction was in the range of 55% to 60%. There was an increased relative contribution of atrial contraction to ventricular filling. Doppler parameters are consistent with abnormal left ventricular relaxation (grade 1 diastolic dysfunction). Doppler parameters are consistent with high ventricular filling pressure. - Left atrium: The atrium was mildly dilated. - Right ventricle: The cavity size was mildly dilated. - Pericardium, extracardiac: A trivial pericardial effusion was identified.      Assessment & Plan:   See problem list. Medications and labs reviewed today.  Time spent with pt/family today 30 minutes, greater than 50% time spent counseling patient on dCHF, breathing issues and medication review. Also review of prior records

## 2013-07-19 NOTE — Assessment & Plan Note (Signed)
Euvolemic today Echocardiogram performed as an inpatient 07/12/2013 reviewed Check BMet, CBC Continue Lasix + ARB/HCTZ as ongoing

## 2013-07-19 NOTE — Assessment & Plan Note (Signed)
Diet controlled until 02/2011 when a1c>7 - started metformin xr 500 qd Exacerbated by frequent steroids for COPD exac On ARB, reports intol to statin years ago  Follows with optho - rankin Check a1c q6-49mo, adjust as needed  Lab Results  Component Value Date   HGBA1C 6.6* 07/11/2013

## 2013-07-28 IMAGING — NM NM PULMONARY VENT & PERF
2 series · 12 of 12 positions shown · non-contrast
Comparison: None
Correlation:  Chest radiograph 10/05/2011

CLINICAL DATA: Shortness of breath, increased D-dimer, contrast
allergy

NUCLEAR MEDICINE VENTILATION - PERFUSION LUNG SCAN
TECHNIQUE: Wash-in, equilibrium, and wash-out phase ventilation
images were obtained using Fe-544 gas.  Perfusion images were
obtained in multiple projections after intravenous injection of Tc-
99m MAA.
Radiopharmaceuticals:  10 mCi Fe-544 gas and 6.0 mCi Rc-KKm MAA.

[vq scan · 2.52mm/px · 6 of 18 frames shown (1 of 2)]
[frame 2/18  full-range]
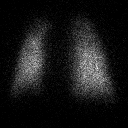
[frame 5/18  full-range]
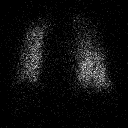
[frame 8/18  full-range]
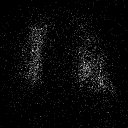
[frame 11/18]
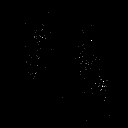
[frame 14/18]
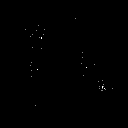
[frame 17/18]
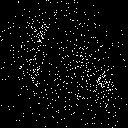

[vq scan · 2.52mm/px · 6 of 18 frames shown (2 of 2)]
[frame 2/18  full-range]
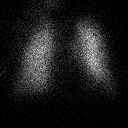
[frame 5/18  full-range]
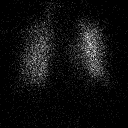
[frame 8/18  full-range]
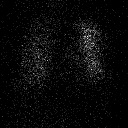
[frame 11/18]
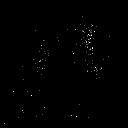
[frame 14/18]
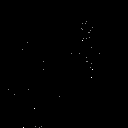
[frame 17/18]
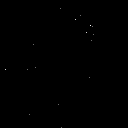

[12 of 12 positions shown; findings below may reference images not displayed]

FINDINGS: Ventilation exam in anterior and posterior projections demonstrates
a slightly smaller left lung than right.
Minimal nonsegmental diminished ventilation at left lung base.
No significant xenon retention.

Perfusion lung scan in eight projections shows slightly smaller
left lung than right.
Minimal nonsegmental diminished perfusion at left lung base.
No segmental or subsegmental perfusion defects identified.

Chest radiograph is significant for cardiomegaly and small left
pleural effusion.
IMPRESSION: Minimal nonsegmental diminished perfusion and ventilation at left
lung base, likely related to small left pleural effusion.
Findings represent a very low probability pulmonary embolism.

## 2013-07-28 IMAGING — CR DG CHEST 2V
1 series · 1 of 1 positions shown · non-contrast
Comparison: 09/19/2011

CLINICAL DATA: Shortness of breath.  History of aortic aneurysm.
Recently hospitalized for hypertension.  Ex-smoker as of 6 weeks
ago.

CHEST - 2 VIEW

[view not recorded]
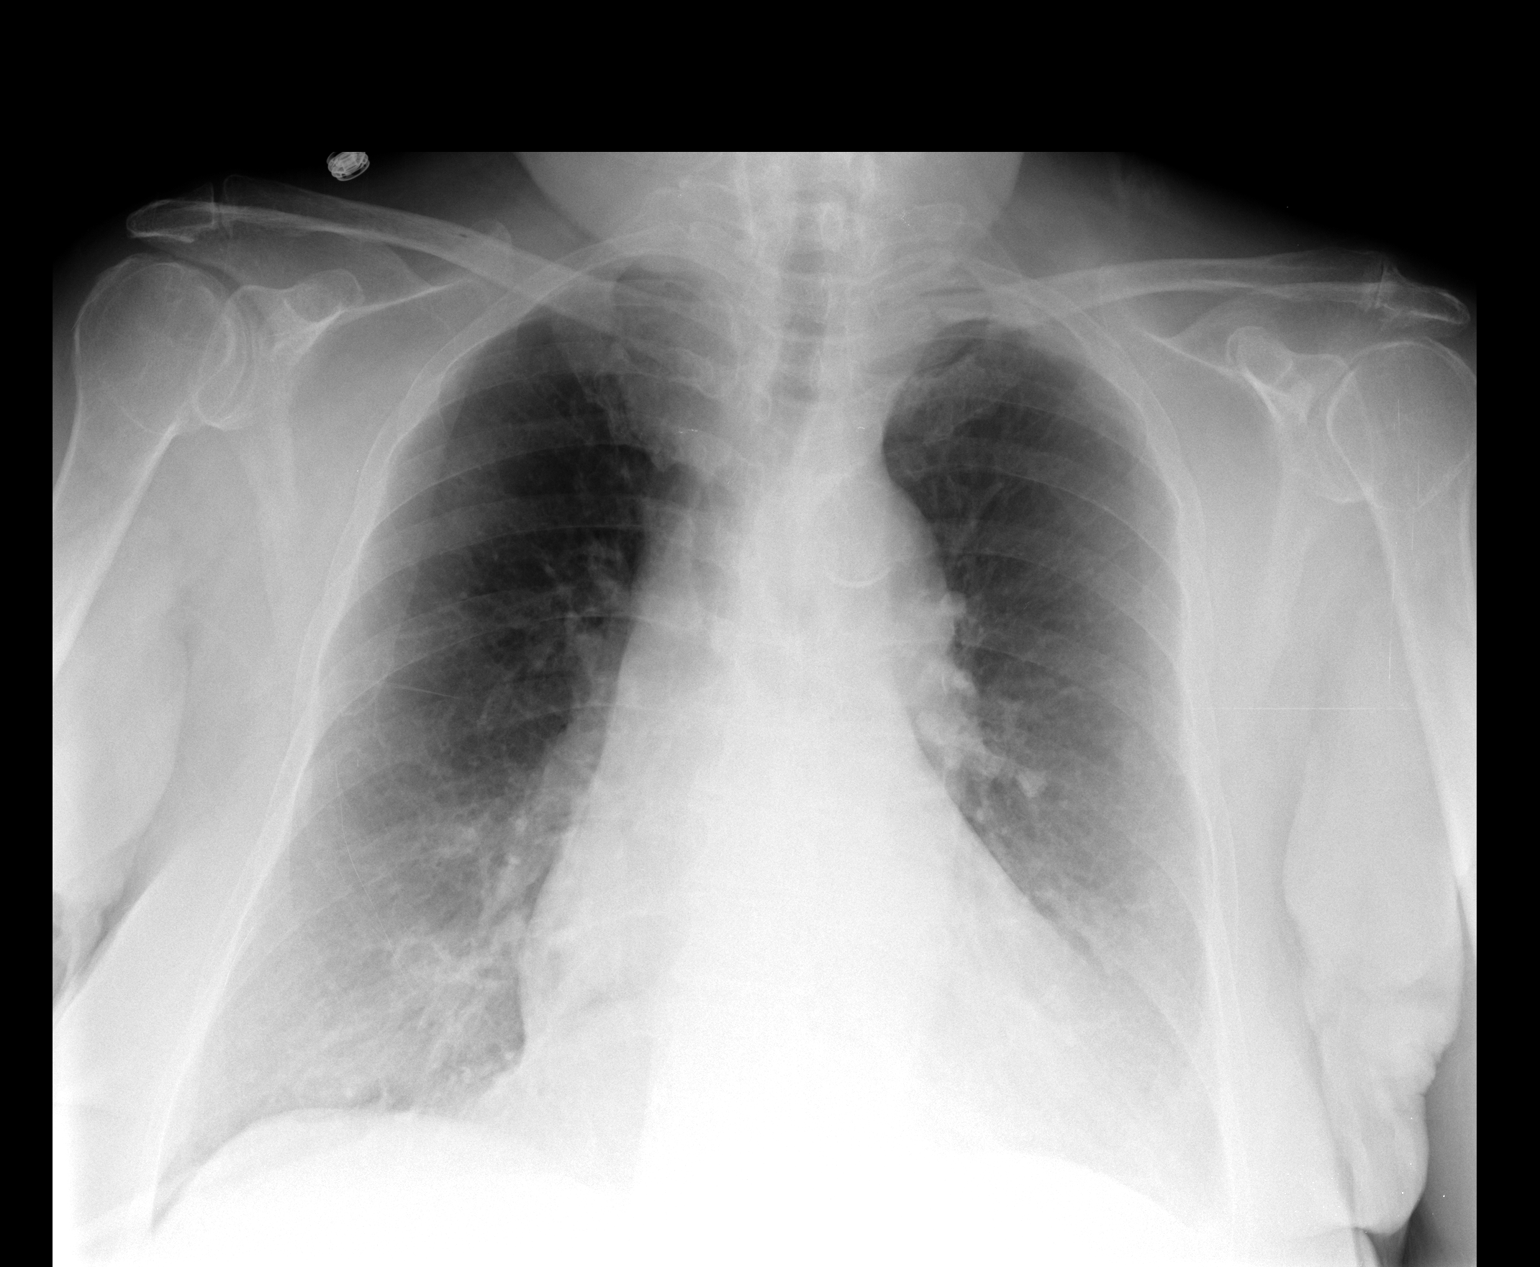

[1 of 1 positions shown; findings below may reference images not displayed]

FINDINGS: Degraded lateral view secondary patient positioning.
Underlying hyperinflation.

Mild osteopenia.  Cardiomegaly with atherosclerosis in the
transverse aorta.  A small left pleural effusion is decreased since
09/19/2011. No pneumothorax.  Biapical pleural thickening. No
congestive failure.

Mild bibasilar volume loss.
IMPRESSION: 1.  Cardiomegaly and hyperinflation, without congestive failure.
2.  Small left pleural effusion.

## 2013-07-29 ENCOUNTER — Ambulatory Visit (INDEPENDENT_AMBULATORY_CARE_PROVIDER_SITE_OTHER): Payer: Medicare Other | Admitting: Psychology

## 2013-07-29 DIAGNOSIS — F4320 Adjustment disorder, unspecified: Secondary | ICD-10-CM

## 2013-08-02 ENCOUNTER — Encounter: Payer: Self-pay | Admitting: Internal Medicine

## 2013-08-02 ENCOUNTER — Ambulatory Visit (INDEPENDENT_AMBULATORY_CARE_PROVIDER_SITE_OTHER): Payer: Medicare Other | Admitting: Internal Medicine

## 2013-08-02 VITALS — BP 122/64 | HR 97 | Temp 97.1°F | Resp 16 | Wt 180.0 lb

## 2013-08-02 DIAGNOSIS — F0390 Unspecified dementia without behavioral disturbance: Secondary | ICD-10-CM

## 2013-08-02 DIAGNOSIS — E119 Type 2 diabetes mellitus without complications: Secondary | ICD-10-CM

## 2013-08-02 NOTE — Patient Instructions (Signed)
Type 2 Diabetes Mellitus, Adult Type 2 diabetes mellitus, often simply referred to as type 2 diabetes, is a long-lasting (chronic) disease. In type 2 diabetes, the pancreas does not make enough insulin (a hormone), the cells are less responsive to the insulin that is made (insulin resistance), or both. Normally, insulin moves sugars from food into the tissue cells. The tissue cells use the sugars for energy. The lack of insulin or the lack of normal response to insulin causes excess sugars to build up in the blood instead of going into the tissue cells. As a result, high blood sugar (hyperglycemia) develops. The effect of high sugar (glucose) levels can cause many complications. Type 2 diabetes was also previously called adult-onset diabetes but it can occur at any age.  RISK FACTORS  A person is predisposed to developing type 2 diabetes if someone in the family has the disease and also has one or more of the following primary risk factors:  Overweight.  An inactive lifestyle.  A history of consistently eating high-calorie foods. Maintaining a normal weight and regular physical activity can reduce the chance of developing type 2 diabetes. SYMPTOMS  A person with type 2 diabetes may not show symptoms initially. The symptoms of type 2 diabetes appear slowly. The symptoms include:  Increased thirst (polydipsia).  Increased urination (polyuria).  Increased urination during the night (nocturia).  Weight loss. This weight loss may be rapid.  Frequent, recurring infections.  Tiredness (fatigue).  Weakness.  Vision changes, such as blurred vision.  Fruity smell to your breath.  Abdominal pain.  Nausea or vomiting.  Cuts or bruises which are slow to heal.  Tingling or numbness in the hands or feet. DIAGNOSIS Type 2 diabetes is frequently not diagnosed until complications of diabetes are present. Type 2 diabetes is diagnosed when symptoms or complications are present and when blood  glucose levels are increased. Your blood glucose level may be checked by one or more of the following blood tests:  A fasting blood glucose test. You will not be allowed to eat for at least 8 hours before a blood sample is taken.  A random blood glucose test. Your blood glucose is checked at any time of the day regardless of when you ate.  A hemoglobin A1c blood glucose test. A hemoglobin A1c test provides information about blood glucose control over the previous 3 months.  An oral glucose tolerance test (OGTT). Your blood glucose is measured after you have not eaten (fasted) for 2 hours and then after you drink a glucose-containing beverage. TREATMENT   You may need to take insulin or diabetes medicine daily to keep blood glucose levels in the desired range.  You will need to match insulin dosing with exercise and healthy food choices. The treatment goal is to maintain the before meal blood sugar (preprandial glucose) level at 70 130 mg/dL. HOME CARE INSTRUCTIONS   Have your hemoglobin A1c level checked twice a year.  Perform daily blood glucose monitoring as directed by your caregiver.  Monitor urine ketones when you are ill and as directed by your caregiver.  Take your diabetes medicine or insulin as directed by your caregiver to maintain your blood glucose levels in the desired range.  Never run out of diabetes medicine or insulin. It is needed every day.  Adjust insulin based on your intake of carbohydrates. Carbohydrates can raise blood glucose levels but need to be included in your diet. Carbohydrates provide vitamins, minerals, and fiber which are an essential part of   a healthy diet. Carbohydrates are found in fruits, vegetables, whole grains, dairy products, legumes, and foods containing added sugars.    Eat healthy foods. Alternate 3 meals with 3 snacks.  Lose weight if overweight.  Carry a medical alert card or wear your medical alert jewelry.  Carry a 15 gram  carbohydrate snack with you at all times to treat low blood glucose (hypoglycemia). Some examples of 15 gram carbohydrate snacks include:  Glucose tablets, 3 or 4   Glucose gel, 15 gram tube  Raisins, 2 tablespoons (24 grams)  Jelly beans, 6  Animal crackers, 8  Regular pop, 4 ounces (120 mL)  Gummy treats, 9  Recognize hypoglycemia. Hypoglycemia occurs with blood glucose levels of 70 mg/dL and below. The risk for hypoglycemia increases when fasting or skipping meals, during or after intense exercise, and during sleep. Hypoglycemia symptoms can include:  Tremors or shakes.  Decreased ability to concentrate.  Sweating.  Increased heart rate.  Headache.  Dry mouth.  Hunger.  Irritability.  Anxiety.  Restless sleep.  Altered speech or coordination.  Confusion.  Treat hypoglycemia promptly. If you are alert and able to safely swallow, follow the 15:15 rule:  Take 15 20 grams of rapid-acting glucose or carbohydrate. Rapid-acting options include glucose gel, glucose tablets, or 4 ounces (120 mL) of fruit juice, regular soda, or low fat milk.  Check your blood glucose level 15 minutes after taking the glucose.  Take 15 20 grams more of glucose if the repeat blood glucose level is still 70 mg/dL or below.  Eat a meal or snack within 1 hour once blood glucose levels return to normal.    Be alert to polyuria and polydipsia which are early signs of hyperglycemia. An early awareness of hyperglycemia allows for prompt treatment. Treat hyperglycemia as directed by your caregiver.  Engage in at least 150 minutes of moderate-intensity physical activity a week, spread over at least 3 days of the week or as directed by your caregiver. In addition, you should engage in resistance exercise at least 2 times a week or as directed by your caregiver.  Adjust your medicine and food intake as needed if you start a new exercise or sport.  Follow your sick day plan at any time you  are unable to eat or drink as usual.  Avoid tobacco use.  Limit alcohol intake to no more than 1 drink per day for nonpregnant women and 2 drinks per day for men. You should drink alcohol only when you are also eating food. Talk with your caregiver whether alcohol is safe for you. Tell your caregiver if you drink alcohol several times a week.  Follow up with your caregiver regularly.  Schedule an eye exam soon after the diagnosis of type 2 diabetes and then annually.  Perform daily skin and foot care. Examine your skin and feet daily for cuts, bruises, redness, nail problems, bleeding, blisters, or sores. A foot exam by a caregiver should be done annually.  Brush your teeth and gums at least twice a day and floss at least once a day. Follow up with your dentist regularly.  Share your diabetes management plan with your workplace or school.  Stay up-to-date with immunizations.  Learn to manage stress.  Obtain ongoing diabetes education and support as needed.  Participate in, or seek rehabilitation as needed to maintain or improve independence and quality of life. Request a physical or occupational therapy referral if you are having foot or hand numbness or difficulties with grooming,   dressing, eating, or physical activity. SEEK MEDICAL CARE IF:   You are unable to eat food or drink fluids for more than 6 hours.  You have nausea and vomiting for more than 6 hours.  Your blood glucose level is over 240 mg/dL.  There is a change in mental status.  You develop an additional serious illness.  You have diarrhea for more than 6 hours.  You have been sick or have had a fever for a couple of days and are not getting better.  You have pain during any physical activity.  SEEK IMMEDIATE MEDICAL CARE IF:  You have difficulty breathing.  You have moderate to large ketone levels. MAKE SURE YOU:  Understand these instructions.  Will watch your condition.  Will get help right away if  you are not doing well or get worse. Document Released: 10/28/2005 Document Revised: 07/22/2012 Document Reviewed: 05/26/2012 [employer] Patient Information 2014 [employer].  

## 2013-08-02 NOTE — Assessment & Plan Note (Signed)
Her blood sugars are well controlled 

## 2013-08-02 NOTE — Progress Notes (Signed)
Subjective:    Patient ID: Paula [name], female    DOB: 1926/11/25, 79 y.o.   MRN: [id number]  HPI Comments: She comes in today concerned about a pain she has had in her upper neck for about 2 years. She tells me that she went to an [facility] yesterday and was told that she has a boil, she was given an Rx for minocycline.     Review of Systems  Constitutional: Negative for fever, chills, diaphoresis, appetite change and fatigue.  HENT: Positive for neck pain.   Eyes: Negative.   Respiratory: Negative.  Negative for cough, choking, chest tightness, shortness of breath, wheezing and stridor.   Cardiovascular: Negative.  Negative for chest pain, palpitations and leg swelling.  Gastrointestinal: Negative.  Negative for nausea, vomiting, abdominal pain, diarrhea and constipation.  Endocrine: Negative.  Negative for polydipsia, polyphagia and polyuria.  Genitourinary: Negative.   Musculoskeletal: Negative for myalgias, back pain, joint swelling and gait problem.  Skin: Negative.   Neurological: Negative.  Negative for dizziness, weakness and light-headedness.  Hematological: Negative.  Negative for adenopathy. Does not bruise/bleed easily.  Psychiatric/Behavioral: Positive for confusion and decreased concentration. Negative for suicidal ideas, hallucinations, behavioral problems, sleep disturbance, self-injury and agitation. The patient is not nervous/anxious and is not hyperactive.        Objective:   Physical Exam  Vitals reviewed. Constitutional: She is oriented to person, place, and time. She appears well-developed and well-nourished.  Non-toxic appearance. She does not have a sickly appearance. She does not appear ill. No distress.  HENT:  Head: Normocephalic and atraumatic.  Mouth/Throat: Oropharynx is clear and moist. No oropharyngeal exudate.  Eyes: Conjunctivae are normal. Right eye exhibits no discharge. Left eye exhibits no discharge. No scleral icterus.  Neck: Normal range of  motion. Neck supple. No JVD present. No tracheal deviation present. No thyromegaly present.  Cardiovascular: Normal rate, regular rhythm, normal heart sounds and intact distal pulses.  Exam reveals no gallop and no friction rub.   No murmur heard. Pulmonary/Chest: Effort normal and breath sounds normal. No stridor. No respiratory distress. She has no wheezes. She has no rales. She exhibits no tenderness.  Abdominal: Soft. Bowel sounds are normal. She exhibits no distension and no mass. There is no tenderness. There is no rebound and no guarding.  Musculoskeletal: She exhibits no edema and no tenderness.       Cervical back: Normal. She exhibits normal range of motion, no tenderness, no bony tenderness, no swelling, no edema, no deformity, no laceration, no pain, no spasm and normal pulse.  Lymphadenopathy:       Head (right side): No submental, no submandibular, no tonsillar, no preauricular, no posterior auricular and no occipital adenopathy present.       Head (left side): No submental, no submandibular, no tonsillar, no preauricular, no posterior auricular and no occipital adenopathy present.    She has no cervical adenopathy.    She has no axillary adenopathy.  Neurological: She is oriented to person, place, and time.  Skin: Skin is warm and dry. No rash noted. She is not diaphoretic. No erythema. No pallor.  Psychiatric: Judgment and thought content normal. Her mood appears anxious. Her affect is not angry, not blunt, not labile and not inappropriate. Her speech is delayed and tangential. Her speech is not rapid and/or pressured. She is slowed. She is not agitated, not aggressive, not hyperactive, not withdrawn, not actively hallucinating and not combative. Cognition and memory are normal. She does not exhibit a  depressed mood. She is communicative. She is attentive.     Lab Results  Component Value Date   WBC 12.0* 07/19/2013   HGB 11.7* 07/19/2013   HCT 34.5* 07/19/2013   PLT 241.0 07/19/2013    GLUCOSE 115* 07/19/2013   CHOL 149 02/10/2013   TRIG 218.0* 02/10/2013   HDL 30.10* 02/10/2013   LDLDIRECT 90.6 02/10/2013   LDLCALC 80 11/04/2011   ALT 17 07/11/2013   AST 21 07/11/2013   NA 142 07/19/2013   K 4.1 07/19/2013   CL 106 07/19/2013   CREATININE 0.9 07/19/2013   BUN 15 07/19/2013   CO2 30 07/19/2013   TSH 0.256* 07/11/2013   INR 1.11 07/11/2013   HGBA1C 6.6* 07/11/2013       Assessment & Plan:

## 2013-08-02 NOTE — Assessment & Plan Note (Addendum)
Her neck is normal - there is no abscess or infection Her somatizations are related to her dementia

## 2013-08-03 ENCOUNTER — Ambulatory Visit (INDEPENDENT_AMBULATORY_CARE_PROVIDER_SITE_OTHER): Payer: Medicare Other | Admitting: Pulmonary Disease

## 2013-08-03 ENCOUNTER — Encounter: Payer: Self-pay | Admitting: Pulmonary Disease

## 2013-08-03 VITALS — BP 120/70 | HR 80 | Temp 98.2°F | Ht 65.0 in | Wt 182.0 lb

## 2013-08-03 DIAGNOSIS — Z23 Encounter for immunization: Secondary | ICD-10-CM

## 2013-08-03 DIAGNOSIS — I509 Heart failure, unspecified: Secondary | ICD-10-CM

## 2013-08-03 DIAGNOSIS — I5032 Chronic diastolic (congestive) heart failure: Secondary | ICD-10-CM

## 2013-08-03 DIAGNOSIS — J439 Emphysema, unspecified: Secondary | ICD-10-CM

## 2013-08-03 DIAGNOSIS — J438 Other emphysema: Secondary | ICD-10-CM

## 2013-08-03 NOTE — Patient Instructions (Addendum)
OK to use combivent as needed Stay on fluid pill Flu shot

## 2013-08-03 NOTE — Progress Notes (Signed)
  Subjective:    Patient ID: Paula [name], female    DOB: 1927/08/23, 79 y.o.   MRN: [id number]  HPI  79 year old ex smoker ( quit 10/12) for FU of COPD  She smoked for >50 yrs , quit 2012 .  She had Recurrent PNA - 2 episodes during 2013  She underwent urgent surgery for abdominal aortic aneurysm by Dr. Hart Rochester in 12/2011. She ambulates with a walker.  CT scan of the chest 12/13 showed Loculated left pleural effusion with suspected rounded atelectasis in the left lower lobe. Evidence of prior granulomatous disease with calcified left lower lobe granuloma, calcified left hilar lymph nodes and calcified granulomas in the liver and spleen.  CT abdomen clarified a cystic mass in the abdomen, likely a pancreatic pseudocyst.  Review of her imaging study shows left lower lobe airspace disease dating back to 10/13.  Of note, she is allergic to contrast media and had a reaction with neck swelling requiring steroids.  ENT evaluation by Dr. 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  FU CXR  showed no acute infiltrate- chronic changes.  Adm 8/31- 07/13/13 for CHF, reviewed CXR 'why do they have me on so many meds?' Ambulates with walker - in rehab Wt has increased -few lbs, compliant with lasix Confused - should I take albuterol or combivent? No discolored mucus. Still weak with little energy.   Review of Systems neg for any significant sore throat, dysphagia, itching, sneezing, nasal congestion or excess/ purulent secretions, fever, chills, sweats, unintended wt loss, pleuritic or exertional cp, hempoptysis, orthopnea  pnd or change in chronic leg swelling. Also denies presyncope, palpitations, heartburn, abdominal pain, nausea, vomiting, diarrhea or change in bowel or urinary habits, dysuria,hematuria, rash, arthralgias, visual complaints, headache, numbness weakness or ataxia.     Objective:   Physical Exam  Gen. Pleasant, obese, in no distress ENT - no lesions, no post nasal drip Neck: No JVD, no thyromegaly, no carotid bruits Lungs: no use of accessory muscles, no dullness to percussion, decreased without rales or rhonchi  Cardiovascular: Rhythm regular, heart sounds  normal, no murmurs or gallops, no peripheral edema Musculoskeletal: No deformities, no cyanosis or clubbing , no tremors       Assessment & Plan:

## 2013-08-04 NOTE — Assessment & Plan Note (Signed)
Combivent OK Doubt need for LABA/LAMA Flu shot

## 2013-08-04 NOTE — Assessment & Plan Note (Signed)
-  cause of recurrent admissions Needs wt based lasix dosing

## 2013-08-05 ENCOUNTER — Ambulatory Visit: Payer: Medicare Other | Admitting: Internal Medicine

## 2013-08-07 ENCOUNTER — Other Ambulatory Visit: Payer: Self-pay | Admitting: Internal Medicine

## 2013-08-13 ENCOUNTER — Ambulatory Visit: Payer: Medicare Other | Admitting: Internal Medicine

## 2013-08-16 ENCOUNTER — Encounter: Payer: Self-pay | Admitting: Internal Medicine

## 2013-08-16 ENCOUNTER — Ambulatory Visit (INDEPENDENT_AMBULATORY_CARE_PROVIDER_SITE_OTHER): Payer: Medicare Other | Admitting: Internal Medicine

## 2013-08-16 VITALS — BP 130/62 | HR 80 | Temp 99.2°F | Wt 176.0 lb

## 2013-08-16 DIAGNOSIS — R110 Nausea: Secondary | ICD-10-CM

## 2013-08-16 DIAGNOSIS — R52 Pain, unspecified: Secondary | ICD-10-CM

## 2013-08-16 MED ORDER — ONDANSETRON HCL 4 MG/2ML IJ SOLN
4.0000 mg | Freq: Once | INTRAMUSCULAR | Status: AC
  Administered 2013-08-16: 4 mg via INTRAMUSCULAR

## 2013-08-16 NOTE — Progress Notes (Signed)
Subjective:    Patient ID: Paula [name], female    DOB: [birth year], 79 y.o.   MRN: [id number]  HPI  Pt presents to the clinic today with c/o nausea and body aches. This started 1 week ago. She has taken her zofran RX when needed.  She is not really sure why she feels bad, but reports she just feels bad. She denies confusion, fever, chest congestion or urinary issues. She does have dememtia.   Review of Systems      Past Medical History  Diagnosis Date  . Depression   . Ejection fraction     EF 40%, echo, November, 2012  / in improved, EF 60%, echo, December, 2012  . Contrast media allergy     Patient feels poorly with contrast  . Status post AAA (abdominal aortic aneurysm) repair     Surgical repair, Dr. Hart Rochester, December 27, 2011  . Pre-syncope     vasovagal w/ heart block  . Parotid mass     has refused further eval 10/2011  . GERD (gastroesophageal reflux disease)   . Anxiety   . Facial tic     L sided spasms - Botox trial summer 2013, ?effective  . Macular degeneration   . AAA (abdominal aortic aneurysm)     s/p repair 12/2011  . Congestive heart failure     NL EF 10/2011 echo   . Diabetes mellitus type II, controlled   . Hypothyroidism   . Sinus bradycardia 09/19/2011    Occurring simultaneously with complete heart block   . Hypertension   . COPD (chronic obstructive pulmonary disease)   . Abdominal spasms     hemifacial-treated w/ botox xeomin -Dr Terrace Arabia    Current Outpatient Prescriptions  Medication Sig Dispense Refill  . albuterol (PROVENTIL HFA;VENTOLIN HFA) 108 (90 BASE) MCG/ACT inhaler Inhale 2 puffs into the lungs every 6 (six) hours as needed for wheezing.  1 Inhaler  2  . ALPRAZolam (XANAX) 1 MG tablet Take 1 mg by mouth 3 (three) times daily as needed for anxiety.      Marland Kitchen aspirin EC 81 MG tablet Take 81 mg by mouth every morning.       . baclofen (LIORESAL) 10 MG tablet Take 5-10 mg by mouth 2 (two) times daily as needed (for muscle spasms).      . calcium  carbonate (OS-CAL) 600 MG TABS Take 600 mg by mouth every other day.       . carvedilol (COREG) 3.125 MG tablet Take 1 tablet (3.125 mg total) by mouth 2 (two) times daily with a meal.  180 tablet  1  . cetirizine (ZYRTEC) 10 MG tablet Take 10 mg by mouth daily as needed for allergies.      . Cholecalciferol (VITAMIN D3) 5000 UNITS TABS Take 1 tablet by mouth every other day.       . furosemide (LASIX) 20 MG tablet Take 1 tablet (20 mg total) by mouth daily.  30 tablet  0  . guaiFENesin (MUCINEX) 600 MG 12 hr tablet Take 1 tablet (600 mg total) by mouth 2 (two) times daily.  60 tablet  5  . Ipratropium-Albuterol (COMBIVENT) 20-100 MCG/ACT AERS respimat Inhale 1 puff into the lungs every 6 (six) hours as needed for wheezing or shortness of breath.  4 g  3  . levothyroxine (SYNTHROID, LEVOTHROID) 175 MCG tablet Take 1 tablet (175 mcg total) by mouth daily.  30 tablet  0  . losartan-hydrochlorothiazide (HYZAAR) 50-12.5 MG per tablet Take  1 tablet by mouth daily.       . metFORMIN (GLUCOPHAGE-XR) 500 MG 24 hr tablet Take 1 tablet (500 mg total) by mouth daily with breakfast.  90 tablet  2  . minocycline (DYNACIN) 100 MG tablet Take 100 mg by mouth 2 (two) times daily.      . ondansetron (ZOFRAN) 4 MG tablet Take 1 tablet (4 mg total) by mouth every 8 (eight) hours as needed for nausea.  20 tablet  1  . ondansetron (ZOFRAN) 4 MG tablet TAKE 1 TABLET BY MOUTH EVERY 8 HOURS AS NEEDED FOR NAUSEA  20 tablet  1  . pantoprazole (PROTONIX) 40 MG tablet Take 1 tablet (40 mg total) by mouth daily.  90 tablet  1  . potassium chloride SA (K-DUR,KLOR-CON) 20 MEQ tablet Take 1 tablet (20 mEq total) by mouth daily.  90 tablet  1  . sertraline (ZOLOFT) 50 MG tablet Take 1 tablet (50 mg total) by mouth daily.  90 tablet  1  . simethicone (MYLICON) 80 MG chewable tablet Chew 2 tablets (160 mg total) by mouth every 6 (six) hours as needed for flatulence.  30 tablet  0   No current facility-administered medications for  this visit.    Allergies  Allergen Reactions  . Bactrim [Sulfamethoxazole-Tmp Ds]   . Contrast Media [Iodinated Diagnostic Agents] Anaphylaxis  . Gadolinium Derivatives   . Iohexol Anaphylaxis, Shortness Of Breath and Swelling     Desc: PT STATES SHE HAD A SEVERE REACTION TO IV CONRAST WITH THROAT SWELLING AND SOB. SHE WAS ADMITTED TO THE HOSPITAL. SHE HAS NEVER HAD CONTRAST AGAIN.   Marland Kitchen Penicillins Rash  . Sulfa Antibiotics Anaphylaxis  . Avelox [Moxifloxacin Hcl In Nacl] Rash  . Ciprofloxacin Itching  . Delsym [Dextromethorphan] Other (See Comments)    Hallucination  . Hydromet [Hydrocodone-Homatropine] Other (See Comments)    Pt states med make her hallucinate    Family History  Problem Relation Age of Onset  . Esophageal cancer Father   . Cancer Father   . Breast cancer Sister   . Cancer Sister   . Colon cancer Sister   . Heart disease Sister   . Heart disease Mother   . Heart disease Son     History   Social History  . Marital Status: Married    Spouse Name: N/A    Number of Children: 4  . Years of Education: N/A   Occupational History  . retired Other    worked for 25 years, hairdresser   Social History Main Topics  . Smoking status: Former Smoker -- 1.00 packs/day for 30 years    Types: Cigarettes    Quit date: 08/31/2011  . Smokeless tobacco: Never Used  . Alcohol Use: No  . Drug Use: No  . Sexual Activity: Not on file   Other Topics Concern  . Not on file   Social History Narrative  . No narrative on file     Constitutional: Pt reports fatigue. Denies fever, malaise,  headache or abrupt weight changes.  HEENT: Denies eye pain, eye redness, ear pain, ringing in the ears, wax buildup, runny nose, nasal congestion, bloody nose, or sore throat. Respiratory: Denies difficulty breathing, shortness of breath, cough or sputum production.   Cardiovascular: Denies chest pain, chest tightness, palpitations or swelling in the hands or feet.   Gastrointestinal: Pt reports nausea. Denies abdominal pain, bloating, constipation, diarrhea or blood in the stool.  GU: Denies urgency, frequency, pain with urination, burning sensation,  blood in urine, odor or discharge. Musculoskeletal: Pt reports generalized body aches. Denies decrease in range of motion, difficulty with gait, muscle pain or joint pain and swelling.   No other specific complaints in a complete review of systems (except as listed in HPI above).  Objective:   Physical Exam   BP 130/62  Pulse 80  Temp(Src) 99.2 F (37.3 C) (Oral)  Wt 176 lb (79.833 kg)  BMI 29.29 kg/m2  SpO2 93% Wt Readings from Last 3 Encounters:  08/16/13 176 lb (79.833 kg)  08/03/13 182 lb (82.555 kg)  08/02/13 180 lb (81.647 kg)    General: Appears her stated age, chronically ill appearing in NAD. HEENT: Head: normal shape and size; Eyes: sclera white, no icterus, conjunctiva pink, PERRLA and EOMs intact; Ears: Tm's gray and intact, normal light reflex; Nose: mucosa pink and moist, septum midline; Throat/Mouth: Teeth present, mucosa pink and moist, no exudate, lesions or ulcerations noted.  Cardiovascular: Normal rate and rhythm. S1,S2 noted.  No murmur, rubs or gallops noted. No JVD or BLE edema. No carotid bruits noted. Pulmonary/Chest: Normal effort and positive vesicular breath sounds. No respiratory distress. No wheezes, rales or ronchi noted.  Abdomen: Soft and nontender. Normal bowel sounds, no bruits noted. No distention or masses noted. Liver, spleen and kidneys non palpable.  BMET    Component Value Date/Time   NA 142 07/19/2013 1109   K 4.1 07/19/2013 1109   CL 106 07/19/2013 1109   CO2 30 07/19/2013 1109   GLUCOSE 115* 07/19/2013 1109   BUN 15 07/19/2013 1109   CREATININE 0.9 07/19/2013 1109   CREATININE 0.80 12/23/2011 0824   CALCIUM 9.4 07/19/2013 1109   GFRNONAA 59* 07/13/2013 0815   GFRAA 69* 07/13/2013 0815    Lipid Panel     Component Value Date/Time   CHOL 149 02/10/2013 0938    TRIG 218.0* 02/10/2013 0938   HDL 30.10* 02/10/2013 0938   CHOLHDL 5 02/10/2013 0938   VLDL 43.6* 02/10/2013 0938   LDLCALC 80 11/04/2011 0657    CBC    Component Value Date/Time   WBC 12.0* 07/19/2013 1109   RBC 3.45* 07/19/2013 1109   HGB 11.7* 07/19/2013 1109   HCT 34.5* 07/19/2013 1109   PLT 241.0 07/19/2013 1109   MCV 100.0 07/19/2013 1109   MCH 33.4 07/12/2013 0305   MCHC 33.9 07/19/2013 1109   RDW 14.7* 07/19/2013 1109   LYMPHSABS 2.5 07/19/2013 1109   MONOABS 0.6 07/19/2013 1109   EOSABS 0.5 07/19/2013 1109   BASOSABS 0.1 07/19/2013 1109    Hgb A1C Lab Results  Component Value Date   HGBA1C 6.6* 07/11/2013        Assessment & Plan:   Nausea and body aches:  Drink plenty of fluids to avoid dehydration 4mg  Zofran IM today Continue to monitor symptoms, if worse RTC

## 2013-08-16 NOTE — Patient Instructions (Signed)
Nausea, Adult Nausea is the feeling that you have an upset stomach or have to vomit. Nausea by itself is not likely a serious concern, but it may be an early sign of more serious medical problems. As nausea gets worse, it can lead to vomiting. If vomiting develops, there is the risk of dehydration.  CAUSES   Viral infections.  Food poisoning.  Medicines.  Pregnancy.  Motion sickness.  Migraine headaches.  Emotional distress.  Severe pain from any source.  Alcohol intoxication. HOME CARE INSTRUCTIONS  Get plenty of rest.  Ask your caregiver about specific rehydration instructions.  Eat small amounts of food and sip liquids more often.  Take all medicines as told by your caregiver. SEEK MEDICAL CARE IF:  You have not improved after 2 days, or you get worse.  You have a headache. SEEK IMMEDIATE MEDICAL CARE IF:   You have a fever.  You faint.  You keep vomiting or have blood in your vomit.  You are extremely weak or dehydrated.  You have dark or bloody stools.  You have severe chest or abdominal pain. MAKE SURE YOU:  Understand these instructions.  Will watch your condition.  Will get help right away if you are not doing well or get worse. Document Released: 12/05/2004 Document Revised: 07/22/2012 Document Reviewed: 07/10/2011 Exit[employer] Patient Information 2014 [employer].  

## 2013-08-16 NOTE — Addendum Note (Signed)
Addended by: Edwena Felty T on: 08/16/2013 03:35 PM   Modules accepted: Orders

## 2013-08-22 ENCOUNTER — Encounter (HOSPITAL_COMMUNITY): Payer: Self-pay | Admitting: Emergency Medicine

## 2013-08-22 ENCOUNTER — Emergency Department (HOSPITAL_COMMUNITY)
Admission: EM | Admit: 2013-08-22 | Discharge: 2013-08-22 | Disposition: A | Discharge status: Home / Self Care | Payer: Medicare Other | Attending: Emergency Medicine | Admitting: Emergency Medicine

## 2013-08-22 DIAGNOSIS — E119 Type 2 diabetes mellitus without complications: Secondary | ICD-10-CM | POA: Insufficient documentation

## 2013-08-22 DIAGNOSIS — J449 Chronic obstructive pulmonary disease, unspecified: Secondary | ICD-10-CM | POA: Insufficient documentation

## 2013-08-22 DIAGNOSIS — Z8669 Personal history of other diseases of the nervous system and sense organs: Secondary | ICD-10-CM | POA: Insufficient documentation

## 2013-08-22 DIAGNOSIS — R5381 Other malaise: Secondary | ICD-10-CM | POA: Insufficient documentation

## 2013-08-22 DIAGNOSIS — F329 Major depressive disorder, single episode, unspecified: Secondary | ICD-10-CM | POA: Insufficient documentation

## 2013-08-22 DIAGNOSIS — K219 Gastro-esophageal reflux disease without esophagitis: Secondary | ICD-10-CM | POA: Insufficient documentation

## 2013-08-22 DIAGNOSIS — R531 Weakness: Secondary | ICD-10-CM

## 2013-08-22 DIAGNOSIS — Z950 Presence of cardiac pacemaker: Secondary | ICD-10-CM | POA: Insufficient documentation

## 2013-08-22 DIAGNOSIS — J4489 Other specified chronic obstructive pulmonary disease: Secondary | ICD-10-CM | POA: Insufficient documentation

## 2013-08-22 DIAGNOSIS — F3289 Other specified depressive episodes: Secondary | ICD-10-CM | POA: Insufficient documentation

## 2013-08-22 DIAGNOSIS — I10 Essential (primary) hypertension: Secondary | ICD-10-CM | POA: Insufficient documentation

## 2013-08-22 DIAGNOSIS — Z880 Allergy status to penicillin: Secondary | ICD-10-CM | POA: Insufficient documentation

## 2013-08-22 DIAGNOSIS — F411 Generalized anxiety disorder: Secondary | ICD-10-CM | POA: Insufficient documentation

## 2013-08-22 DIAGNOSIS — Z87891 Personal history of nicotine dependence: Secondary | ICD-10-CM | POA: Insufficient documentation

## 2013-08-22 DIAGNOSIS — Z7982 Long term (current) use of aspirin: Secondary | ICD-10-CM | POA: Insufficient documentation

## 2013-08-22 DIAGNOSIS — E039 Hypothyroidism, unspecified: Secondary | ICD-10-CM | POA: Insufficient documentation

## 2013-08-22 DIAGNOSIS — I509 Heart failure, unspecified: Secondary | ICD-10-CM | POA: Insufficient documentation

## 2013-08-22 DIAGNOSIS — Z79899 Other long term (current) drug therapy: Secondary | ICD-10-CM | POA: Insufficient documentation

## 2013-08-22 LAB — CBC WITH DIFFERENTIAL/PLATELET
Basophils Absolute: 0.1 10*3/uL (ref 0.0–0.1)
Basophils Relative: 1 % (ref 0–1)
HCT: 35.2 % — ABNORMAL LOW (ref 36.0–46.0)
Hemoglobin: 11.7 g/dL — ABNORMAL LOW (ref 12.0–15.0)
Lymphocytes Relative: 19 % (ref 12–46)
MCHC: 33.2 g/dL (ref 30.0–36.0)
Monocytes Absolute: 0.8 10*3/uL (ref 0.1–1.0)
Neutro Abs: 8.5 10*3/uL — ABNORMAL HIGH (ref 1.7–7.7)
Neutrophils Relative %: 69 % (ref 43–77)
RDW: 14.2 % (ref 11.5–15.5)
WBC: 12.3 10*3/uL — ABNORMAL HIGH (ref 4.0–10.5)

## 2013-08-22 LAB — GLUCOSE, CAPILLARY: Glucose-Capillary: 130 mg/dL — ABNORMAL HIGH (ref 70–99)

## 2013-08-22 LAB — COMPREHENSIVE METABOLIC PANEL
ALT: 24 U/L (ref 0–35)
AST: 25 U/L (ref 0–37)
Albumin: 3.7 g/dL (ref 3.5–5.2)
Alkaline Phosphatase: 64 U/L (ref 39–117)
CO2: 26 mEq/L (ref 19–32)
Chloride: 103 mEq/L (ref 96–112)
Creatinine, Ser: 0.88 mg/dL (ref 0.50–1.10)
GFR calc non Af Amer: 59 mL/min — ABNORMAL LOW (ref >90)
Potassium: 4 mEq/L (ref 3.5–5.1)
Total Bilirubin: 0.4 mg/dL (ref 0.3–1.2)

## 2013-08-22 LAB — URINALYSIS, ROUTINE W REFLEX MICROSCOPIC
Bilirubin Urine: NEGATIVE
Glucose, UA: NEGATIVE mg/dL
Hgb urine dipstick: NEGATIVE
Ketones, ur: NEGATIVE mg/dL
Nitrite: NEGATIVE
Specific Gravity, Urine: 1.027 (ref 1.005–1.030)
pH: 6.5 (ref 5.0–8.0)

## 2013-08-22 NOTE — ED Notes (Signed)
Pt c/o of generalized body aches. States "I just feel sick". Denies n/v/d/, fever, pain.

## 2013-08-22 NOTE — ED Provider Notes (Signed)
CSN: [id number]     Arrival date & time 08/22/13  1007 History   First MD Initiated Contact with Patient 08/22/13 1017     Chief Complaint  Patient presents with  . Generalized Body Aches   (Consider location/radiation/quality/duration/timing/severity/associated sxs/prior Treatment) The history is provided by the patient and the spouse.   pt here complaining of generalized body aches and weakness x2 years. She denies any headache, neck pain, chest pain, abdominal pain. No syncope or near-syncope. No anginal symptoms. No urinary symptoms. No severe headaches. Symptoms have been progressively worse and she is missing her Dr. who has been unable to make a diagnosis. Office records have been reviewed and they're also perplexed by her symptoms. No medications done for this. She presented via EMS.  Past Medical History  Diagnosis Date  . Depression   . Ejection fraction     EF 40%, echo, November, 2012  / in improved, EF 60%, echo, December, 2012  . Contrast media allergy     Patient feels poorly with contrast  . Status post AAA (abdominal aortic aneurysm) repair     Surgical repair, Dr. Hart Rochester, December 27, 2011  . Pre-syncope     vasovagal w/ heart block  . Parotid mass     has refused further eval 10/2011  . GERD (gastroesophageal reflux disease)   . Anxiety   . Facial tic     L sided spasms - Botox trial summer 2013, ?effective  . Macular degeneration   . AAA (abdominal aortic aneurysm)     s/p repair 12/2011  . Congestive heart failure     NL EF 10/2011 echo   . Diabetes mellitus type II, controlled   . Hypothyroidism   . Sinus bradycardia 09/19/2011    Occurring simultaneously with complete heart block   . Hypertension   . COPD (chronic obstructive pulmonary disease)   . Abdominal spasms     hemifacial-treated w/ botox xeomin -Dr Terrace Arabia   Past Surgical History  Procedure Laterality Date  . Cholecystectomy    . Knee cartilage surgery      left  . Sinus surgery with instatrak     . Appendectomy    . Oophorectomy      ovarian cyst  . Cataract extraction      bilateral  . Bladder repair    . US echocardiography  10/14/11  . Abdominal aortic aneurysm repair  12/27/2011    Procedure: ANEURYSM ABDOMINAL AORTIC REPAIR;  Surgeon: Josephina Gip, MD;  Location: [facility] OR;  Service: Vascular;  Laterality: N/A;  Resection and Grafting Abdominal Aortic Aneurysm , Aorta Bi Iliac.  Marland Kitchen Pacemaker insertion  11/12  . Cardiac catheterization  11/04/11   Family History  Problem Relation Age of Onset  . Esophageal cancer Father   . Cancer Father   . Breast cancer Sister   . Cancer Sister   . Colon cancer Sister   . Heart disease Sister   . Heart disease Mother   . Heart disease Son    History  Substance Use Topics  . Smoking status: Former Smoker -- 1.00 packs/day for 30 years    Types: Cigarettes    Quit date: 08/31/2011  . Smokeless tobacco: Never Used  . Alcohol Use: No   OB History   Grav Para Term Preterm Abortions TAB SAB Ect Mult Living                 Review of Systems  All other systems reviewed and are negative.  Allergies  Bactrim; Contrast media; Gadolinium derivatives; Iohexol; Penicillins; Sulfa antibiotics; Avelox; Ciprofloxacin; Delsym; and Hydromet  Home Medications   Current Outpatient Rx  Name  Route  Sig  Dispense  Refill  . albuterol (PROVENTIL HFA;VENTOLIN HFA) 108 (90 BASE) MCG/ACT inhaler   Inhalation   Inhale 2 puffs into the lungs every 6 (six) hours as needed for wheezing.   1 Inhaler   2   . ALPRAZolam (XANAX) 1 MG tablet   Oral   Take 1 mg by mouth 3 (three) times daily as needed for anxiety.         Marland Kitchen aspirin EC 81 MG tablet   Oral   Take 81 mg by mouth every morning.          . baclofen (LIORESAL) 10 MG tablet   Oral   Take 5-10 mg by mouth 2 (two) times daily as needed (for muscle spasms).         . calcium carbonate (OS-CAL) 600 MG TABS   Oral   Take 600 mg by mouth every other day.          . carvedilol  (COREG) 3.125 MG tablet   Oral   Take 1 tablet (3.125 mg total) by mouth 2 (two) times daily with a meal.   180 tablet   1   . cetirizine (ZYRTEC) 10 MG tablet   Oral   Take 10 mg by mouth daily as needed for allergies.         . Cholecalciferol (VITAMIN D3) 5000 UNITS TABS   Oral   Take 1 tablet by mouth every other day.          . furosemide (LASIX) 20 MG tablet   Oral   Take 1 tablet (20 mg total) by mouth daily.   30 tablet   0   . guaiFENesin (MUCINEX) 600 MG 12 hr tablet   Oral   Take 1 tablet (600 mg total) by mouth 2 (two) times daily.   60 tablet   5   . Ipratropium-Albuterol (COMBIVENT) 20-100 MCG/ACT AERS respimat   Inhalation   Inhale 1 puff into the lungs every 6 (six) hours as needed for wheezing or shortness of breath.   4 g   3   . levothyroxine (SYNTHROID, LEVOTHROID) 175 MCG tablet   Oral   Take 1 tablet (175 mcg total) by mouth daily.   30 tablet   0   . losartan-hydrochlorothiazide (HYZAAR) 50-12.5 MG per tablet   Oral   Take 1 tablet by mouth daily.          . metFORMIN (GLUCOPHAGE-XR) 500 MG 24 hr tablet   Oral   Take 1 tablet (500 mg total) by mouth daily with breakfast.   90 tablet   2   . ondansetron (ZOFRAN) 4 MG tablet   Oral   Take 1 tablet (4 mg total) by mouth every 8 (eight) hours as needed for nausea.   20 tablet   1   . pantoprazole (PROTONIX) 40 MG tablet   Oral   Take 1 tablet (40 mg total) by mouth daily.   90 tablet   1   . potassium chloride SA (K-DUR,KLOR-CON) 20 MEQ tablet   Oral   Take 1 tablet (20 mEq total) by mouth daily.   90 tablet   1   . sertraline (ZOLOFT) 50 MG tablet   Oral   Take 1 tablet (50 mg total) by mouth daily.   90 tablet  1   . simethicone (MYLICON) 80 MG chewable tablet   Oral   Chew 2 tablets (160 mg total) by mouth every 6 (six) hours as needed for flatulence.   30 tablet   0    BP 168/56  Pulse 75  Temp(Src) 98.5 F (36.9 C) (Oral)  Resp 16  SpO2 96% Physical  Exam  Nursing note and vitals reviewed. Constitutional: She is oriented to person, place, and time. She appears well-developed and well-nourished.  Non-toxic appearance. No distress.  HENT:  Head: Normocephalic and atraumatic.  Eyes: Conjunctivae, EOM and lids are normal. Pupils are equal, round, and reactive to light.  Neck: Normal range of motion. Neck supple. No tracheal deviation present. No mass present.  Cardiovascular: Normal rate, regular rhythm and normal heart sounds.  Exam reveals no gallop.   No murmur heard. Pulmonary/Chest: Effort normal and breath sounds normal. No stridor. No respiratory distress. She has no decreased breath sounds. She has no wheezes. She has no rhonchi. She has no rales.  Abdominal: Soft. Normal appearance and bowel sounds are normal. She exhibits no distension. There is no tenderness. There is no rebound and no CVA tenderness.  Musculoskeletal: Normal range of motion. She exhibits no edema and no tenderness.  Neurological: She is alert and oriented to person, place, and time. She has normal strength. No cranial nerve deficit or sensory deficit. GCS eye subscore is 4. GCS verbal subscore is 5. GCS motor subscore is 6.  Skin: Skin is warm and dry. No abrasion and no rash noted.  Psychiatric: She has a normal mood and affect. Her speech is normal and behavior is normal.    ED Course  Procedures (including critical care time) Labs Review Labs Reviewed  GLUCOSE, CAPILLARY - Abnormal; Notable for the following:    Glucose-Capillary 130 (*)    All other components within normal limits   Imaging Review No results found.  EKG Interpretation   None       MDM  No diagnosis found. Patient's labs have been reviewed here. Thyroid function studies are pending. Mild hyperglycemia noted. Mild leukocytosis noted. Patient's weakness for 2 years has been constant and may be slightly worse today. No further workup needed. No urinary symptoms. No focal neurological  deficits. Patient informed of up with her Dr.    Toy Baker, MD 08/22/13 [phone]

## 2013-08-22 NOTE — ED Notes (Signed)
Bed: WA17 Expected date:  Expected time:  Means of arrival:  Comments: ems- 79 yo F, not feeling well

## 2013-08-22 NOTE — ED Notes (Signed)
CBG 168. 

## 2013-08-22 NOTE — ED Notes (Signed)
Pt escorted to bathroom for urinary sample

## 2013-08-23 ENCOUNTER — Ambulatory Visit (INDEPENDENT_AMBULATORY_CARE_PROVIDER_SITE_OTHER): Payer: Medicare Other | Admitting: Internal Medicine

## 2013-08-23 ENCOUNTER — Encounter: Payer: Self-pay | Admitting: Internal Medicine

## 2013-08-23 ENCOUNTER — Ambulatory Visit (INDEPENDENT_AMBULATORY_CARE_PROVIDER_SITE_OTHER)
Admission: RE | Admit: 2013-08-23 | Discharge: 2013-08-23 | Disposition: A | Discharge status: Home / Self Care | Payer: Medicare Other | Source: Ambulatory Visit | Attending: Internal Medicine | Admitting: Internal Medicine

## 2013-08-23 VITALS — BP 122/72 | HR 77 | Temp 100.3°F | Wt 175.0 lb

## 2013-08-23 DIAGNOSIS — G518 Other disorders of facial nerve: Secondary | ICD-10-CM

## 2013-08-23 DIAGNOSIS — J189 Pneumonia, unspecified organism: Secondary | ICD-10-CM

## 2013-08-23 DIAGNOSIS — R509 Fever, unspecified: Secondary | ICD-10-CM

## 2013-08-23 DIAGNOSIS — G514 Facial myokymia: Secondary | ICD-10-CM

## 2013-08-23 DIAGNOSIS — E119 Type 2 diabetes mellitus without complications: Secondary | ICD-10-CM

## 2013-08-23 MED ORDER — DOXYCYCLINE HYCLATE 100 MG PO TABS: 100.0000 mg | ORAL_TABLET | Freq: Two times a day (BID) | ORAL | Status: DC

## 2013-08-23 NOTE — Progress Notes (Signed)
Subjective:    Patient ID: Paula [name], female    DOB: 1927/05/05, 79 y.o.   MRN: [id number]  HPI  Here for ER follow up - seen yesterday for fatigue and "sick"  Denies change in chronic dyspnea, cough and facial spasm symptoms Also reviewed chronic medical issues and medications  Past Medical History  Diagnosis Date  . Depression   . Ejection fraction     EF 40%, echo, November, 2012  / in improved, EF 60%, echo, December, 2012  . Contrast media allergy     Patient feels poorly with contrast  . Status post AAA (abdominal aortic aneurysm) repair     Surgical repair, Dr. Hart Rochester, December 27, 2011  . Pre-syncope     vasovagal w/ heart block  . Parotid mass     has refused further eval 10/2011  . GERD (gastroesophageal reflux disease)   . Anxiety   . Facial tic     L sided spasms - Botox trial summer 2013, ?effective  . Macular degeneration   . AAA (abdominal aortic aneurysm)     s/p repair 12/2011  . Congestive heart failure     NL EF 10/2011 echo   . Diabetes mellitus type II, controlled   . Hypothyroidism   . Sinus bradycardia 09/19/2011    Occurring simultaneously with complete heart block   . Hypertension   . COPD (chronic obstructive pulmonary disease)   . Abdominal spasms     hemifacial-treated w/ botox xeomin -Dr Terrace Arabia    Review of Systems  Constitutional: Positive for malaise/fatigue. Negative for fever and weight loss.  HENT: Positive for hoarse voice. Negative for ear pain, postnasal drip, rhinorrhea, sneezing, sore throat and trouble swallowing.   Respiratory: Positive for cough (chronic) and shortness of breath (chronic).   Cardiovascular: Positive for dyspnea on exertion.  Gastrointestinal: Negative for heartburn.  Musculoskeletal: Positive for gait problem. Negative for joint swelling and myalgias.  Neurological: Positive for speech difficulty (chronic "drawing up of mouth", follows with neuro for same) and weakness. Negative for headaches.   Psychiatric/Behavioral: Negative for sleep disturbance and dysphoric mood.       Objective:   Physical Exam BP 122/72  Pulse 77  Temp(Src) 100.3 F (37.9 C) (Oral)  Wt 175 lb (79.379 kg)  BMI 29.12 kg/m2  SpO2 95% Wt Readings from Last 3 Encounters:  08/23/13 175 lb (79.379 kg)  08/16/13 176 lb (79.833 kg)  08/03/13 182 lb (82.555 kg)   Gen: NAD, nontoxic. hoarse without change. Spouse at side Lungs: CTA B - no wheeze but diminished air movement at bases CV: RRR, no edema BLE MSkel: Back: patient walks with use of walker and ambulates with cautious gait. No gross deformity or joint swelling Psychiatric: tangential    Lab Results  Component Value Date   WBC 12.3* 08/22/2013   HGB 11.7* 08/22/2013   HCT 35.2* 08/22/2013   PLT 296 08/22/2013   GLUCOSE 142* 08/22/2013   CHOL 149 02/10/2013   TRIG 218.0* 02/10/2013   HDL 30.10* 02/10/2013   LDLDIRECT 90.6 02/10/2013   LDLCALC 80 11/04/2011   ALT 24 08/22/2013   AST 25 08/22/2013   NA 140 08/22/2013   K 4.0 08/22/2013   CL 103 08/22/2013   CREATININE 0.88 08/22/2013   BUN 13 08/22/2013   CO2 26 08/22/2013   TSH 0.359 08/22/2013   INR 1.11 07/11/2013   HGBA1C 6.6* 07/11/2013   2D Echo 07/12/13 (IP): Study Conclusions  - Left ventricle: Regional wall motion  was difficult to accurately assess, but the basal anteriorand anterolateral walls may have been mildly hypokinetic. Wall thickness was increased in a pattern of mild LVH. Systolic function was normal. The estimated ejection fraction was in the range of 55% to 60%. There was an increased relative contribution of atrial contraction to ventricular filling. Doppler parameters are consistent with abnormal left ventricular relaxation (grade 1 diastolic dysfunction). Doppler parameters are consistent with high ventricular filling pressure. - Left atrium: The atrium was mildly dilated. - Right ventricle: The cavity size was mildly dilated. - Pericardium, extracardiac: A  trivial pericardial effusion was identified.      Assessment & Plan:   LGF, chronic fatigue Chronic cough  Check CXR now given hx recurrent pneumonia Start empiric doxy x 1 week  UA and other labs in ER yesterday reviewed - nondx  See problem list. Medications and labs reviewed today.

## 2013-08-23 NOTE — Assessment & Plan Note (Signed)
eval and tx ongoing by neuro - ?benefit of prior botox injections -  follow up for repeat injection as needed (neuro feels psychogenic) Continue baclofen as ongoing

## 2013-08-23 NOTE — Assessment & Plan Note (Signed)
Diet controlled until 02/2011 when a1c>7 - started metformin xr 500 qd Exacerbated by frequent steroids for COPD exac On ARB, reports intol to statin years ago  Follows with optho - rankin Check a1c q6-70mo, adjust as needed  Lab Results  Component Value Date   HGBA1C 6.6* 07/11/2013

## 2013-08-23 NOTE — Assessment & Plan Note (Signed)
Extensive pulm evaluation for same Check CXR now Empiric doxy Continue nebs prn

## 2013-08-23 NOTE — Patient Instructions (Signed)
It was good to see you today.  We have reviewed your prior records including ER labs and tests today  Medications reviewed and updated: start doxycycline antibiotics 2 x/day until gone - Your prescription(s) have been submitted to your pharmacy. Please take as directed and contact our office if you believe you are having problem(s) with the medication(s).  Test(s) ordered today. Your results will be released to MyChart (or called to you) after review, usually within 72hours after test completion. If any changes need to be made, you will be notified at that same time.  Followup as scheduled (3 months), call sooner if problems

## 2013-08-24 ENCOUNTER — Telehealth: Payer: Self-pay | Admitting: *Deleted

## 2013-08-24 ENCOUNTER — Ambulatory Visit (INDEPENDENT_AMBULATORY_CARE_PROVIDER_SITE_OTHER): Payer: Medicare Other | Admitting: Internal Medicine

## 2013-08-24 ENCOUNTER — Encounter: Payer: Self-pay | Admitting: Internal Medicine

## 2013-08-24 VITALS — BP 124/84 | HR 75 | Temp 98.6°F | Wt 175.0 lb

## 2013-08-24 DIAGNOSIS — B370 Candidal stomatitis: Secondary | ICD-10-CM

## 2013-08-24 MED ORDER — NYSTATIN 100000 UNIT/ML MT SUSP: 500000.0000 [IU] | Freq: Four times a day (QID) | OROMUCOSAL | Status: DC

## 2013-08-24 NOTE — Progress Notes (Signed)
Subjective:    Patient ID: Paula [name], female    DOB: 30-Jan-1927, 79 y.o.   MRN: [id number]  HPI  Pt presents to the clinic today with c/o "not feeling well" She does have dementia. She was seen yesterday for the same. She was started on empiric doxy for fever. Chest xray was obtained but negative for pneumonia. Today she c/o generalized pain but she can't describe where. She is nauseated. This is a chronic issue that she take zofran for.  Review of Systems      Past Medical History  Diagnosis Date  . Depression   . Ejection fraction     EF 40%, echo, November, 2012  / in improved, EF 60%, echo, December, 2012  . Contrast media allergy     Patient feels poorly with contrast  . Status post AAA (abdominal aortic aneurysm) repair     Surgical repair, Dr. Hart Rochester, December 27, 2011  . Pre-syncope     vasovagal w/ heart block  . Parotid mass     has refused further eval 10/2011  . GERD (gastroesophageal reflux disease)   . Anxiety   . Facial tic     L sided spasms - Botox trial summer 2013, ?effective  . Macular degeneration   . AAA (abdominal aortic aneurysm)     s/p repair 12/2011  . Congestive heart failure     NL EF 10/2011 echo   . Diabetes mellitus type II, controlled   . Hypothyroidism   . Sinus bradycardia 09/19/2011    Occurring simultaneously with complete heart block   . Hypertension   . COPD (chronic obstructive pulmonary disease)   . Abdominal spasms     hemifacial-treated w/ botox xeomin -Dr Terrace Arabia    Current Outpatient Prescriptions  Medication Sig Dispense Refill  . albuterol (PROVENTIL HFA;VENTOLIN HFA) 108 (90 BASE) MCG/ACT inhaler Inhale 2 puffs into the lungs every 6 (six) hours as needed for wheezing.  1 Inhaler  2  . ALPRAZolam (XANAX) 1 MG tablet Take 1 mg by mouth 3 (three) times daily as needed for anxiety.      Marland Kitchen aspirin EC 81 MG tablet Take 81 mg by mouth every morning.       . baclofen (LIORESAL) 10 MG tablet Take 5-10 mg by mouth 2 (two) times  daily as needed (for muscle spasms).      . calcium carbonate (OS-CAL) 600 MG TABS Take 600 mg by mouth every other day.       . carvedilol (COREG) 3.125 MG tablet Take 1 tablet (3.125 mg total) by mouth 2 (two) times daily with a meal.  180 tablet  1  . cetirizine (ZYRTEC) 10 MG tablet Take 10 mg by mouth daily as needed for allergies.      . Cholecalciferol (VITAMIN D3) 5000 UNITS TABS Take 1 tablet by mouth every other day.       Marland Kitchen doxycycline (VIBRA-TABS) 100 MG tablet Take 1 tablet (100 mg total) by mouth 2 (two) times daily.  20 tablet  0  . furosemide (LASIX) 20 MG tablet Take 1 tablet (20 mg total) by mouth daily.  30 tablet  0  . guaiFENesin (MUCINEX) 600 MG 12 hr tablet Take 1 tablet (600 mg total) by mouth 2 (two) times daily.  60 tablet  5  . Ipratropium-Albuterol (COMBIVENT) 20-100 MCG/ACT AERS respimat Inhale 1 puff into the lungs every 6 (six) hours as needed for wheezing or shortness of breath.  4 g  3  .  levothyroxine (SYNTHROID, LEVOTHROID) 175 MCG tablet Take 1 tablet (175 mcg total) by mouth daily.  30 tablet  0  . losartan-hydrochlorothiazide (HYZAAR) 50-12.5 MG per tablet Take 1 tablet by mouth daily.       . metFORMIN (GLUCOPHAGE-XR) 500 MG 24 hr tablet Take 1 tablet (500 mg total) by mouth daily with breakfast.  90 tablet  2  . ondansetron (ZOFRAN) 4 MG tablet Take 1 tablet (4 mg total) by mouth every 8 (eight) hours as needed for nausea.  20 tablet  1  . pantoprazole (PROTONIX) 40 MG tablet Take 1 tablet (40 mg total) by mouth daily.  90 tablet  1  . potassium chloride SA (K-DUR,KLOR-CON) 20 MEQ tablet Take 1 tablet (20 mEq total) by mouth daily.  90 tablet  1  . sertraline (ZOLOFT) 50 MG tablet Take 1 tablet (50 mg total) by mouth daily.  90 tablet  1  . simethicone (MYLICON) 80 MG chewable tablet Chew 2 tablets (160 mg total) by mouth every 6 (six) hours as needed for flatulence.  30 tablet  0   No current facility-administered medications for this visit.    Allergies   Allergen Reactions  . Bactrim [Sulfamethoxazole-Tmp Ds]   . Contrast Media [Iodinated Diagnostic Agents] Anaphylaxis  . Gadolinium Derivatives   . Iohexol Anaphylaxis, Shortness Of Breath and Swelling     Desc: PT STATES SHE HAD A SEVERE REACTION TO IV CONRAST WITH THROAT SWELLING AND SOB. SHE WAS ADMITTED TO THE HOSPITAL. SHE HAS NEVER HAD CONTRAST AGAIN.   Marland Kitchen Penicillins Rash  . Sulfa Antibiotics Anaphylaxis  . Avelox [Moxifloxacin Hcl In Nacl] Rash  . Ciprofloxacin Itching  . Delsym [Dextromethorphan] Other (See Comments)    Hallucination  . Hydromet [Hydrocodone-Homatropine] Other (See Comments)    Pt states med make her hallucinate    Family History  Problem Relation Age of Onset  . Esophageal cancer Father   . Cancer Father   . Breast cancer Sister   . Cancer Sister   . Colon cancer Sister   . Heart disease Sister   . Heart disease Mother   . Heart disease Son     History   Social History  . Marital Status: Married    Spouse Name: N/A    Number of Children: 4  . Years of Education: N/A   Occupational History  . retired Other    worked for 25 years, hairdresser   Social History Main Topics  . Smoking status: Former Smoker -- 1.00 packs/day for 30 years    Types: Cigarettes    Quit date: 08/31/2011  . Smokeless tobacco: Never Used  . Alcohol Use: No  . Drug Use: No  . Sexual Activity: Not on file   Other Topics Concern  . Not on file   Social History Narrative  . No narrative on file     Constitutional: Pt reports "not feeling well", fatigue. Denies fever, malaise, headache or abrupt weight changes.  HEENT: Pt reports hoarseness. Denies eye pain, eye redness, ear pain, ringing in the ears, wax buildup, runny nose, nasal congestion, bloody nose, or sore throat. Respiratory: Denies difficulty breathing, shortness of breath, cough or sputum production.   Cardiovascular: Denies chest pain, chest tightness, palpitations or swelling in the hands or feet.   Gastrointestinal: Pt reports nausea. Denies abdominal pain, bloating, constipation, diarrhea or blood in the stool.  GU: Denies urgency, frequency, pain with urination, burning sensation, blood in urine, odor or discharge.  No other specific  complaints in a complete review of systems (except as listed in HPI above).  Objective:   Physical Exam   BP 124/84  Pulse 75  Temp(Src) 98.6 F (37 C) (Oral)  Wt 175 lb (79.379 kg)  BMI 29.12 kg/m2  SpO2 93% Wt Readings from Last 3 Encounters:  08/24/13 175 lb (79.379 kg)  08/23/13 175 lb (79.379 kg)  08/16/13 176 lb (79.833 kg)    General: Appears her stated age, chronically ill appearing in NAD. HEENT: Head: normal shape and size; Eyes: sclera white, no icterus, conjunctiva pink, PERRLA and EOMs intact; Ears: Tm's gray and intact, normal light reflex; Nose: mucosa pink and moist, septum midline; Throat/Mouth: Teeth present, mucosa pink and moist, no exudate, lesions or ulcerations noted. Thrush noted on tongue. Neck: Normal range of motion. Neck supple, trachea midline. No massses, lumps or thyromegaly present.  Cardiovascular: Normal rate and rhythm. S1,S2 noted.  No murmur, rubs or gallops noted. No JVD or BLE edema. No carotid bruits noted. Pulmonary/Chest: Normal effort and positive vesicular breath sounds. No respiratory distress. No wheezes, rales or ronchi noted.  Abdomen: Soft and nontender. Normal bowel sounds, no bruits noted. No distention or masses noted. Liver, spleen and kidneys non palpable.   BMET    Component Value Date/Time   NA 140 08/22/2013 1100   K 4.0 08/22/2013 1100   CL 103 08/22/2013 1100   CO2 26 08/22/2013 1100   GLUCOSE 142* 08/22/2013 1100   BUN 13 08/22/2013 1100   CREATININE 0.88 08/22/2013 1100   CREATININE 0.80 12/23/2011 0824   CALCIUM 10.0 08/22/2013 1100   GFRNONAA 59* 08/22/2013 1100   GFRAA 68* 08/22/2013 1100    Lipid Panel     Component Value Date/Time   CHOL 149 02/10/2013 0938   TRIG  218.0* 02/10/2013 0938   HDL 30.10* 02/10/2013 0938   CHOLHDL 5 02/10/2013 0938   VLDL 43.6* 02/10/2013 0938   LDLCALC 80 11/04/2011 0657    CBC    Component Value Date/Time   WBC 12.3* 08/22/2013 1100   RBC 3.43* 08/22/2013 1100   HGB 11.7* 08/22/2013 1100   HCT 35.2* 08/22/2013 1100   PLT 296 08/22/2013 1100   MCV 102.6* 08/22/2013 1100   MCH 34.1* 08/22/2013 1100   MCHC 33.2 08/22/2013 1100   RDW 14.2 08/22/2013 1100   LYMPHSABS 2.4 08/22/2013 1100   MONOABS 0.8 08/22/2013 1100   EOSABS 0.6 08/22/2013 1100   BASOSABS 0.1 08/22/2013 1100    Hgb A1C Lab Results  Component Value Date   HGBA1C 6.6* 07/11/2013        Assessment & Plan:   Thrush secondary to antibiotic:  Advised pt to stop taking abx- as there is no evidence of pna on chest xray eRx for nystatin swish and swallow  RTC as needed

## 2013-08-24 NOTE — Patient Instructions (Signed)
Thrush, Adult   Thrush is a yeast infection that develops in the mouth and throat and on the tongue. The medical term for this is oropharyngeal candidiasis, or OPC. Thrush is most common in older adults, but it can occur at any age. Thrush occurs when a yeast called candida grows out of control. Candida normally is present in small amounts in the mouth and on other mucous membranes. However, under certain circumstances, candida can grow rapidly, causing thrush. Thrush can be a recurring problem for people who have chronic illnesses or who take medications that limit the body's ability to fight infection (weakened immune system). Since these people have difficulty fighting infections, the fungus that causes thrush can spread throughout the body. This can cause life-threatening blood or organ infections.  CAUSES   Candida, the yeast that causes thrush, is normally present in small amounts in the mouth and on other mucous membranes. It usually causes no harm. However, when conditions are present that allow the yeast to grow uncontrolled, it invades surrounding tissues and becomes an infection. Thrush is most commonly caused by the yeast Candida albicans. Less often, other forms of candida can lead to thrush.  There are many types of bacteria in your mouth that normally control the growth of candida. Sometimes a new type of bacteria gets into your mouth and disrupts the balance of the germs already there. This can allow candida to overgrow. Other factors that increase your risk of developing thrush include:   An impaired ability to fight infection (weakened immune system). A normal immune system is usually strong enough to prevent candida from overgrowing.   Older adults are more likely to develop thrush because they may have weaker immune systems.   People with human immunodeficiency virus (HIV) infection have a high likelihood of developing thrush. About 90% of people with HIV develop thrush at some point during  the course of their disease.   People with diabetes are more likely to get thrush because high blood sugar levels promote overgrowth of the candida fungus.   A dry mouth (xerostomia). Dry mouth can result from overuse of mouthwashes or from certain conditions such as Sjgren's syndrome.   Pregnancy. Hormone changes during pregnancy can lead to thrush by altering the balance of bacteria in the mouth.   Poor dental care, especially in people who have false teeth.   The use of antibiotic medications. This may lead to thrush by changing the balance of bacteria in the mouth.  SYMPTOMS   Thrush can be a mild infection that causes no symptoms. If symptoms develop, they may include the following:   A burning feeling in the mouth and throat. This can occur at the start of a thrush infection.   White patches that adhere to the mouth and tongue. The tissue around the patches may be red, raw, and painful. If rubbed (during tooth brushing, for example), the patches and the tissue of the mouth may bleed easily.   A bad taste in the mouth or difficulty tasting foods.   Cottony feeling in the mouth.   Sometimes pain during eating and swallowing.  DIAGNOSIS   Your caregiver can usually diagnose thrush by exam. In addition to looking in your mouth, your caregiver will ask you questions about your health.  TREATMENT   Medications that help prevent the growth of fungi (antifungals) are the standard treatment for thrush. These medications are either applied directly to the affected area (topical) or swallowed (oral).  Mild thrush  In   adults, mild cases of thrush may clear up with simple treatment that can be done at home. This treatment usually involves using an antifungal mouth rinse or lozenges. Treatment usually lasts about 14 days.  Moderate to severe thrush   More severe thrush infections that have spread to the esophagus are treated with an oral antifungal medication. A topical antifungal medication may also be  used.   For some severe infections, a treatment period longer than 14 days may be needed.   Oral antifungal medications are almost never used during pregnancy because the fetus may be harmed. However, if a pregnant woman has a rare, severe thrush infection that has spread to her blood, oral antifungal medications may be used. In this case, the risk of harm to the mother and fetus from the severe thrush infection may be greater than the risk posed by the use of antifungal medications.  Persistent or recurrent thrush  Persistent (does not go away) or recurrent (keeps coming back) cases of thrush may:   Need to be treated twice as long as the symptoms last.   Require treatment with both oral and topical antifungal medications.   People with weakened immune systems can take an antifungal medication on a continuous basis to prevent thrush infections.  It is important to treat conditions that make you more likely to get thrush, such as diabetes, human immunodeficiency virus (HIV), or cancer.   HOME CARE INSTRUCTIONS    If you are breast-feeding, you should clean your nipples with an antifungal medication, such as nystatin (Mycostatin). Dry your nipples after breast-feeding. Applying lanolin-containing body lotion may help relieve nipple soreness.   If you wear dentures and get thrush, remove dentures before going to bed, brush them vigorously, and soak in a solution of chlorhexidine gluconate or a product such as Polident or Efferdent.   Eating plain, unflavored yogurt that contains live cultures (check the label) can also help cure thrush. Yogurt helps healthy bacteria grow in the mouth. These bacteria stop the growth of the yeast that causes thrush.   Adults can treat thrush at home with gentian violet (1%), a dye that kills bacteria and fungi. It is available without a prescription. If there is no known cause for the infection or if gentian violet does not cure the thrush, you need to see your  caregiver.  Comfort measures  Measures can be taken to reduce the discomfort of thrush:   Drink cold liquids such as water or iced tea. Eat flavored ice treats or frozen juices.   Eat foods that are easy to swallow such as gelatin, ice cream, or custard.   If the patches are painful, try drinking from a straw.   Rinse your mouth several times a day with a warm saltwater rinse. You can make the saltwater mixture with 1 tsp (5 g) of salt in 8 fl oz (0.2 L) of warm water.  PROGNOSIS    Most cases of thrush are mild and clear up with the use of an antifungal mouth rinse or lozenges. Very mild cases of thrush may clear up without medical treatment. It usually takes about 14 days of treatment with an oral antifungal medication to cure more severe thrush infections. In some cases, thrush may last several weeks even with treatment.   If thrush goes untreated and does not go away by itself, it can spread to other parts of the body.   Thrush can spread to the throat, the vagina, or the skin. It rarely spreads   to other organs of the body.  Thrush is more likely to recur (come back) in:   People who use inhaled corticosteroids to treat asthma.   People who take antibiotic medications for a long time.   People who have false teeth.   People who have a weakened immune system.  RISKS AND COMPLICATIONS  Complications related to thrush are rare in healthy people.  There are several factors that can increase your risk of developing thrush.  Age  Older adults, especially those who have serious health problems, are more likely to develop thrush because their immune systems are likely to be weaker.  Behavior   The yeast that causes thrush can be spread by oral sex.   Heavy smoking can lower the body's ability to fight off infections. This makes thrush more likely to develop.  Other conditions   False teeth (dentures), braces, or a retainer that irritates the mouth make it hard to keep the mouth clean. An unclean mouth is  more likely to develop thrush than a clean mouth.   People with a weakened immune system, such as those who have diabetes or human immunodeficiency virus (HIV) or who are undergoing chemotherapy, have an increased risk for developing thrush.  Medications  Some medications can allow the fungus that causes thrush to grow uncontrolled. Common ones are:   Antibiotics, especially those that kill a wide range of organisms (broad-spectrum antibiotics), such as tetracycline commonly can cause thrush.   Birth control pills (oral contraceptives).   Medications that weaken the body's immune system, such as corticosteroids.  Environment  Exposure over time to certain environmental chemicals, such as benzene and pesticides, can weaken the body's immune system. This increases your risk for developing infections, including thrush.  SEEK IMMEDIATE MEDICAL CARE IF:   Your symptoms are getting worse or are not improving within 7 days of starting treatment.   You have symptoms of spreading infection, such as white patches on the skin outside of the mouth.   You are nursing and you have redness and pain in the nipples in spite of home treatment or if you have burning pain in the nipple area when you nurse. Your baby's mouth should also be examined to determine whether thrush is causing your symptoms.  Document Released: 07/23/2004 Document Revised: 01/20/2012 Document Reviewed: 11/02/2008  [employer] Patient Information 2014 [employer].

## 2013-08-24 NOTE — Telephone Encounter (Signed)
[facility] Triage Call Report Triage Record Num: [id number] Operator: Chevis Pretty Patient Name: Paula [name] Call Date & Time: 08/23/2013 7:53:45PM Patient Phone: [phone] PCP: Rene Paci Patient Gender: Female PCP Fax : [phone] Patient DOB: [birth year] Practice Name: [facility] Reason for Call: Caller: [name]; PCP: Rene Paci (Adults only); CB#: [id number]; Call regarding Calling for Xray Results. Per Epic, xray results in chart but not released to patient. Patient states she has concerns that her neck has painful hard knot which is sensitive to touch. Lump is not red but patient temp 100.3 O in the office during office visit 08/23/13. States the lump/s is located at the hairline in the back of her neck. States she has not been feeling well and "tends to run fevers more often than not." Per swollen lymph nodes protocol, advised being seen withiin 24 hours; appt scheduled 1015 08/24/13 with Nicki Reaper. Protocol(s) Used: Skin Lesions Protocol(s) Used: Swollen Lymph Nodes Recommended Outcome per Protocol: See Provider within 24 hours Reason for Outcome: One or more enlarged or tender lymph nodes New onset of localized or generalized swelling of lymph nodes in a high risk individual Care Advice: ~ Rest as much as possible. ~ Avoid sexual activity of any kind until evaluated by provider. ~ Do not use any medication for pain or fever (aspirin, acetaminophen, or ibuprofen) until evaluated by provider. ~ Call provider if symptoms worsen or new symptoms develop. ~ Avoid drinking alcohol until discussing with provider. Avoid close physical contact (such as shaking hands, kissing, sexual contact or shared needles). Also do not share eating or drinking utensils. ~ ~ SYMPTOM / CONDITION MANAGEMENT ~ CAUTIONS Total water intake includes drinking water, water in beverages, and water contained in food. Fluids make up about 80% of the body's total hydration  need. Individual fluid requirement to maintain hydration vary based on physical activity, environmental factors and illness. Limit fluids that contain sugar, caffeine, or alcohol. Urine will be very light yellow color when you drink enough fluids. ~ 08/23/2013 8:09:09PM Page 1 of 1 [id number]

## 2013-08-27 ENCOUNTER — Ambulatory Visit (INDEPENDENT_AMBULATORY_CARE_PROVIDER_SITE_OTHER): Payer: Medicare Other | Admitting: Neurology

## 2013-08-27 ENCOUNTER — Encounter: Payer: Self-pay | Admitting: Neurology

## 2013-08-27 VITALS — BP 145/84 | HR 65 | Ht 64.0 in | Wt 175.0 lb

## 2013-08-27 DIAGNOSIS — G518 Other disorders of facial nerve: Secondary | ICD-10-CM

## 2013-08-27 DIAGNOSIS — G5139 Clonic hemifacial spasm, unspecified: Secondary | ICD-10-CM

## 2013-08-27 MED ORDER — INCOBOTULINUMTOXINA 50 UNITS IM SOLR
50.0000 [IU] | Freq: Once | INTRAMUSCULAR | Status: AC
  Administered 2013-08-27: 50 [IU] via INTRAMUSCULAR

## 2013-08-27 NOTE — Progress Notes (Signed)
History of Present Illness   Paula [name] is a a 79 years old right-handed Caucasian female, referred by Dr. Vickey Huger for the evaluation of EMG guided bottulinumtoxin injection for left hemifacial spasm, she is accompanied by her husband.  She has past medical history of diet-controlled diabetes, coronary artery disease, husband reported 3 episode of sudden loss of consciousness from October to December 2012, "telemetry went flat", she also underwent aortic aneurysm repair  in December 27, 2011,She reported 6 years history of gradual onset left facial spasm, she denies history of left Bell's palsy, initial spasm involving left eye, now spreading to involving her whole left face, bothersome to her, she actually presented to the emergency room twice recently, because severe left facial twitching, to the point of causing anxiety, " I was so scared" She denied left facial sensory change, she has poor vision due to macular degeneration bilaterally, reported acute worsening of right vision since most recent aortic aneurysm repair,She also reports few weeks history of acute worsening gait difficulty, stuttering of her speech,I reviewed most recent MRI of brain in December 2012, there was mild atrophy, extensive periventricular white matter disease, MRA of the brain showed no large vessel disease.  I performed EMG guided xeomin injection into her left face in  07/14/2012, 22.5 unit, she responded very well, there was no significant side effect, because she was doing so well, I do not perform scheduled injection in February 2014,   She came in earlier than expected today, complains of coughing, mild fever, not feeling well, was just evaluated by her primary care physician, does not want to have injection today she denied recurrence of her left facial spasm.  She was reevaluated by Dr. Vickey Huger in April 28th 2014.  UPDATE 08/27/2013: She has received EMG guided Xeomin injection in the past, responded well in the  past. Now come back for recurrent left and right lower eye lid muscle twitching.    Physical Exam   General: well developed, well  nourished, seated,anxious-looking, intermittent wet cough  Head: head  normocephalic and atraumatic.  Oropharynx benign. Ears, Nose and Throat: [id number]  Neck: supple, no carotid bruits  Respiratory: clear to auscultation bilaterally Cardiovascular: regular rate rhythm Skin: no edema   Neurologic Exam  Mental Status: elderly caucasain female, awake, alert,  Cranial Nerves: PERL. EOM intact without nystamus. Visual fields full. Hearing intack.  She has occasional left facial twitching involving her left and right lower eyelid. There was no significant asymmetry of her face. Motor:  no significant weakness in the extremities   Sensory: Normal to light touch, pinprick, and vibratory in all extremities.   Coordination:  There was no dysmetria noticed. Gait and Station: push up from seated position, cautious,  mild unsteady, dragging right leg, Enblock turning,  No assistive device Reflexes: Deep tendon reflexes:2+.  Plantar responses are flexor.   Assessment and Plan: 79 year old with long-standing left hemifacial spasm since 2011, since her oral surgery. She has significant side effects from CBZ and stopped. Taking Xanax 4 times daily with reductionin spasms. Recent  AAA surgery in Feb 2013.   EMG guided Xeomin injection for left facial spasm. (50 units of xeomin was dissolved into 1 cc of NS, Lot No [id number], Exp March 2015), discarded 27.5 units  Injection was placed at left and right lower eye lid at 3,6, 8, 9 clock, (2.5 units each x8=20 units total), 2. 5 units at left zygomatic major.  She tolerated the injection well, will return to clinic in  3 months or longer for repeat injection.

## 2013-08-31 ENCOUNTER — Ambulatory Visit (INDEPENDENT_AMBULATORY_CARE_PROVIDER_SITE_OTHER): Payer: Medicare Other | Admitting: Psychology

## 2013-08-31 DIAGNOSIS — F4320 Adjustment disorder, unspecified: Secondary | ICD-10-CM

## 2013-09-06 ENCOUNTER — Encounter: Payer: Self-pay | Admitting: Internal Medicine

## 2013-09-06 ENCOUNTER — Ambulatory Visit (INDEPENDENT_AMBULATORY_CARE_PROVIDER_SITE_OTHER): Payer: Medicare Other | Admitting: Internal Medicine

## 2013-09-06 ENCOUNTER — Ambulatory Visit: Payer: Medicare Other | Admitting: Internal Medicine

## 2013-09-06 VITALS — BP 120/68 | HR 84 | Temp 98.8°F | Wt 173.1 lb

## 2013-09-06 DIAGNOSIS — R002 Palpitations: Secondary | ICD-10-CM

## 2013-09-06 MED ORDER — CARVEDILOL 6.25 MG PO TABS: 6.2500 mg | ORAL_TABLET | Freq: Two times a day (BID) | ORAL | Status: DC

## 2013-09-06 NOTE — Progress Notes (Signed)
Subjective:    Patient ID: Paula [name], female    DOB: [birth year], 79 y.o.   MRN: [id number]  HPI  Patient here today for evaluation of palpitations.  States symptoms began 2-3 days ago.  States symptoms come and go, last for about 30 seconds to 1 minute at a time.  No dizziness, syncope, or lightheadedness.  Feels like "skipping".  No chest pain.  Shortness of breath stable for patient.   Past Medical History  Diagnosis Date  . Depression   . Ejection fraction     EF 40%, echo, November, 2012  / in improved, EF 60%, echo, December, 2012  . Contrast media allergy     Patient feels poorly with contrast  . Status post AAA (abdominal aortic aneurysm) repair     Surgical repair, Dr. Hart [place], December 27, 2011  . Pre-syncope     vasovagal w/ heart block  . Parotid mass     has refused further eval 10/2011  . GERD (gastroesophageal reflux disease)   . Anxiety   . Facial tic     L sided spasms - Botox trial summer 2013, ?effective  . Macular degeneration   . AAA (abdominal aortic aneurysm)     s/p repair 12/2011  . Congestive heart failure     NL EF 10/2011 echo   . Diabetes mellitus type II, controlled   . Hypothyroidism   . Sinus bradycardia 09/19/2011    Occurring simultaneously with complete heart block   . Hypertension   . COPD (chronic obstructive pulmonary disease)   . Abdominal spasms     hemifacial-treated w/ botox xeomin -Dr Terrace Arabia    Review of Systems  Constitutional: Positive for fatigue (chronic w/o change). Negative for fever and unexpected weight change.  Respiratory: Negative for cough and shortness of breath.   Cardiovascular: Positive for palpitations. Negative for chest pain and leg swelling.       Objective:   Physical Exam  Wt Readings from Last 3 Encounters:  09/06/13 173 lb 1.9 oz (78.527 kg)  08/27/13 175 lb (79.379 kg)  08/24/13 175 lb (79.379 kg)   BP Readings from Last 3 Encounters:  09/06/13 120/68  08/27/13 145/84  08/24/13 124/84   Lab  Results  Component Value Date   WBC 12.3* 08/22/2013   HGB 11.7* 08/22/2013   HCT 35.2* 08/22/2013   PLT 296 08/22/2013   GLUCOSE 142* 08/22/2013   CHOL 149 02/10/2013   TRIG 218.0* 02/10/2013   HDL 30.10* 02/10/2013   LDLDIRECT 90.6 02/10/2013   LDLCALC 80 11/04/2011   ALT 24 08/22/2013   AST 25 08/22/2013   NA 140 08/22/2013   K 4.0 08/22/2013   CL 103 08/22/2013   CREATININE 0.88 08/22/2013   BUN 13 08/22/2013   CO2 26 08/22/2013   TSH 0.359 08/22/2013   INR 1.11 07/11/2013   HGBA1C 6.6* 07/11/2013       Assessment & Plan:   1. Palpitations - pt reports symptoms have been occuring in the last 2-3 days.  No dizziness or pre-syncope.   Noted/reviewed remote history of sinus bradycardia and transient complete heart block in 2012 at time of AAA repair in the setting of syncope - felt to be vasovagal.  No recurrent syncope since that time.   Echo with LVEF 55-60% 07-2013.  Recent labwork including electrolytes, TSH and CBC normal.   Patient has not been taking Coreg past 2 days.  Will resume and increase Coreg dose to 6.25mg  bid at this time.  Patient declines holter monitor.  Follow up with cardiology later this week - appt scheduled for same.

## 2013-09-06 NOTE — Progress Notes (Signed)
Pre-visit discussion using our clinic review tool. No additional management support is needed unless otherwise documented below in the visit note.  

## 2013-09-06 NOTE — Patient Instructions (Addendum)
It was good to see you today.  We have reviewed your prior records including labs and tests today  Medications reviewed and updated, crease Coreg dose from 3.125 mg twice daily 6.25 mg twice daily -no other changes recommended at this time.  Your prescription(s) have been submitted to your pharmacy. Please take as directed and contact our office if you believe you are having problem(s) with the medication(s).  We have scheduled your cardiology followup with Dr. Tereso Newcomer for this Thursday, October 29 at 2:20 PM  If still having skipping heart and palpitation symptoms on Thursday, please discuss further with Dr. Lorin Picket  Palpitations  A palpitation is the feeling that your heartbeat is irregular or is faster than normal. It may feel like your heart is fluttering or skipping a beat. Palpitations are usually not a serious problem. However, in some cases, you may need further medical evaluation. CAUSES  Palpitations can be caused by:  Smoking.  Caffeine or other stimulants, such as diet pills or energy drinks.  Alcohol.  Stress and anxiety.  Strenuous physical activity.  Fatigue.  Certain medicines.  Heart disease, especially if you have a history of arrhythmias. This includes atrial fibrillation, atrial flutter, or supraventricular tachycardia.  An improperly working pacemaker or defibrillator. DIAGNOSIS  To find the cause of your palpitations, your caregiver will take your history and perform a physical exam. Tests may also be done, including:  Electrocardiography (ECG). This test records the heart's electrical activity.  Cardiac monitoring. This allows your caregiver to monitor your heart rate and rhythm in real time.  Holter monitor. This is a portable device that records your heartbeat and can help diagnose heart arrhythmias. It allows your caregiver to track your heart activity for several days, if needed.  Stress tests by exercise or by giving medicine that makes the  heart beat faster. TREATMENT  Treatment of palpitations depends on the cause of your symptoms and can vary greatly. Most cases of palpitations do not require any treatment other than time, relaxation, and monitoring your symptoms. Other causes, such as atrial fibrillation, atrial flutter, or supraventricular tachycardia, usually require further treatment. HOME CARE INSTRUCTIONS   Avoid:  Caffeinated coffee, tea, soft drinks, diet pills, and energy drinks.  Chocolate.  Alcohol.  Stop smoking if you smoke.  Reduce your stress and anxiety. Things that can help you relax include:  A method that measures bodily functions so you can learn to control them (biofeedback).  Yoga.  Meditation.  Physical activity such as swimming, jogging, or walking.  Get plenty of rest and sleep. SEEK MEDICAL CARE IF:   You continue to have a fast or irregular heartbeat beyond 24 hours.  Your palpitations occur more often. SEEK IMMEDIATE MEDICAL CARE IF:  You develop chest pain or shortness of breath.  You have a severe headache.  You feel dizzy, or you faint. MAKE SURE YOU:  Understand these instructions.  Will watch your condition.  Will get help right away if you are not doing well or get worse. Document Released: 10/25/2000 Document Revised: 04/28/2012 Document Reviewed: 12/27/2011 [facility] Patient Information 2014 [place], [place].

## 2013-09-08 ENCOUNTER — Ambulatory Visit: Payer: Medicare Other | Admitting: Nurse Practitioner

## 2013-09-08 ENCOUNTER — Telehealth: Payer: Self-pay

## 2013-09-08 ENCOUNTER — Telehealth: Payer: Self-pay | Admitting: Physician Assistant

## 2013-09-08 NOTE — Telephone Encounter (Signed)
Patient called spoke to Norma Fredrickson NP she advised she needs to see PCP about nausea and numbness in face.Appointment with Lawson Fiscal cancelled for today.Patient stated she wanted to see Tereso Newcomer PA tomorrow as originally scheduled.Stated she saw PCP and she changed her medication and wanted her to be seen here.Apppointment rescheduled with Tereso Newcomer PA 09/09/13 at 2:20pm.

## 2013-09-08 NOTE — Telephone Encounter (Signed)
New message    Wife is seeing scott weaver tomorrow for clearance. Husband want to know if we can see wife this afternoon because she is in pain and needs to be cleared soon. Can I move pt to lori's schedule this afternoon?  She is a Dr Myrtis Ser pt.

## 2013-09-08 NOTE — Telephone Encounter (Signed)
Returned call to patient she stated she woke up this morning with nausea and numbness in left face.Stated she is not having any surgery and needs no clearance.Stated she has been sick for 2 years and no one has helped her. Stated she has been having nausea and numbness in face.Appointment scheduled with Norma Fredrickson NP today at 2:00 pm.

## 2013-09-09 ENCOUNTER — Encounter: Payer: Self-pay | Admitting: Physician Assistant

## 2013-09-09 ENCOUNTER — Encounter (INDEPENDENT_AMBULATORY_CARE_PROVIDER_SITE_OTHER): Payer: Medicare Other

## 2013-09-09 ENCOUNTER — Ambulatory Visit (INDEPENDENT_AMBULATORY_CARE_PROVIDER_SITE_OTHER): Payer: Medicare Other | Admitting: Physician Assistant

## 2013-09-09 ENCOUNTER — Ambulatory Visit: Payer: Medicare Other | Admitting: Physician Assistant

## 2013-09-09 ENCOUNTER — Encounter: Payer: Self-pay | Admitting: Cardiology

## 2013-09-09 ENCOUNTER — Other Ambulatory Visit: Payer: Self-pay | Admitting: Internal Medicine

## 2013-09-09 VITALS — BP 110/60 | HR 84 | Ht 65.0 in | Wt 175.0 lb

## 2013-09-09 DIAGNOSIS — I5032 Chronic diastolic (congestive) heart failure: Secondary | ICD-10-CM

## 2013-09-09 DIAGNOSIS — I509 Heart failure, unspecified: Secondary | ICD-10-CM

## 2013-09-09 DIAGNOSIS — R002 Palpitations: Secondary | ICD-10-CM

## 2013-09-09 DIAGNOSIS — I2510 Atherosclerotic heart disease of native coronary artery without angina pectoris: Secondary | ICD-10-CM

## 2013-09-09 DIAGNOSIS — I10 Essential (primary) hypertension: Secondary | ICD-10-CM

## 2013-09-09 NOTE — Progress Notes (Signed)
[address], [address] [place], [place]  [address] Phone: [phone] Fax:  [phone]  Date:  09/09/2013   ID:  Paula [name], DOB Sep 08, 1927, MRN [id number]  PCP:  Rene Paci, MD  Cardiologist:  Dr. Zackery Barefoot     History of Present Illness: Paula [name] is a 79 y.o. female with a hx of cardiomyopathy with prior EF 40%, improved to normal, nonobstructive CAD, AAA status post repair in 2013, GERD, diastolic CHF, T2DM, hypothyroidism, HTN, COPD.  Myoview (12/12): Anteroseptal defect likely breast attenuation but small scar with subtle peri-infarct ischemia cannot be excluded. LHC (12/12): MID left Main 20, proximal LAD 30, LAD 30, proximal RCA 30, mid RCA 30.  Echo (9/14): mild LVH, EF 55-60%, grade 1 diastolic dysfunction, mild LAE, mild RV, trivial effusion.  Last seen by Dr. Myrtis Ser 02/2013.  Recently seen by PCP for palpitations. Beta blocker adjusted.  She is a poor historian.  Her husband often answers for her.  She describes her palpitations as a cramp in her left chest.  She did not take the coreg until this AM.  At first she states it was making her sick.  But, she took it today and has felt ok.  She has chronic DOE.  She is NYHA III.  She denies orthopnea, PND, edema.  No syncope.    Recent Labs: Creat  Date/Time Value Range Status  12/23/2011  8:24 AM 0.80  0.50 - 1.10 mg/dL Final     Creatinine, Ser  Date/Time Value Range Status  08/22/2013 11:00 AM 0.88  0.50 - 1.10 mg/dL Final  02/09/6605 30:16 AM 0.9  0.4 - 1.2 mg/dL Final     ALT  Date/Time Value Range Status  08/22/2013 11:00 AM 24  0 - 35 U/L Final     TSH  Date/Time Value Range Status  08/22/2013 11:00 AM 0.359  0.350 - 4.500 uIU/mL Final     Performed at [facility]   Lab Results  Component Value Date   LDLCALC 80 11/04/2011      Wt Readings from Last 3 Encounters:  09/09/13 175 lb (79.379 kg)  09/06/13 173 lb 1.9 oz (78.527 kg)  08/27/13 175 lb (79.379 kg)     Past Medical History    Diagnosis Date  . Depression   . Ejection fraction     EF 40%, echo, November, 2012  / in improved, EF 60%, echo, December, 2012  . Contrast media allergy     Patient feels poorly with contrast  . Status post AAA (abdominal aortic aneurysm) repair     Surgical repair, Dr. Hart Rochester, December 27, 2011  . Pre-syncope     vasovagal w/ heart block  . Parotid mass     has refused further eval 10/2011  . GERD (gastroesophageal reflux disease)   . Anxiety   . Facial tic     L sided spasms - Botox trial summer 2013, ?effective  . Macular degeneration   . AAA (abdominal aortic aneurysm)     s/p repair 12/2011  . Congestive heart failure     NL EF 10/2011 echo   . Diabetes mellitus type II, controlled   . Hypothyroidism   . Sinus bradycardia 09/19/2011    Occurring simultaneously with complete heart block   . Hypertension   . COPD (chronic obstructive pulmonary disease)   . Abdominal spasms     hemifacial-treated w/ botox xeomin -Dr Terrace Arabia    Current Outpatient Prescriptions  Medication Sig Dispense  Refill  . albuterol (PROVENTIL HFA;VENTOLIN HFA) 108 (90 BASE) MCG/ACT inhaler Inhale 2 puffs into the lungs every 6 (six) hours as needed for wheezing.  1 Inhaler  2  . ALPRAZolam (XANAX) 1 MG tablet Take 1 mg by mouth 3 (three) times daily as needed for anxiety.      Marland Kitchen aspirin EC 81 MG tablet Take 81 mg by mouth every morning.       . baclofen (LIORESAL) 10 MG tablet Take 5-10 mg by mouth 2 (two) times daily as needed (for muscle spasms).      . calcium carbonate (OS-CAL) 600 MG TABS Take 600 mg by mouth every other day.       . carvedilol (COREG) 6.25 MG tablet Take 1 tablet (6.25 mg total) by mouth 2 (two) times daily with a meal.  180 tablet  1  . cetirizine (ZYRTEC) 10 MG tablet Take 10 mg by mouth daily as needed for allergies.      . Cholecalciferol (VITAMIN D3) 5000 UNITS TABS Take 1 tablet by mouth every other day.       . furosemide (LASIX) 20 MG tablet Take 1 tablet (20 mg total) by  mouth daily.  30 tablet  0  . guaiFENesin (MUCINEX) 600 MG 12 hr tablet Take 1 tablet (600 mg total) by mouth 2 (two) times daily.  60 tablet  5  . Ipratropium-Albuterol (COMBIVENT) 20-100 MCG/ACT AERS respimat Inhale 1 puff into the lungs every 6 (six) hours as needed for wheezing or shortness of breath.  4 g  3  . levothyroxine (SYNTHROID, LEVOTHROID) 175 MCG tablet Take 1 tablet (175 mcg total) by mouth daily.  30 tablet  0  . losartan-hydrochlorothiazide (HYZAAR) 50-12.5 MG per tablet Take 1 tablet by mouth daily.       . metFORMIN (GLUCOPHAGE-XR) 500 MG 24 hr tablet Take 1 tablet (500 mg total) by mouth daily with breakfast.  90 tablet  2  . nystatin (MYCOSTATIN) 100000 UNIT/ML suspension Take 5 mLs (500,000 Units total) by mouth 4 (four) times daily.  60 mL  0  . ondansetron (ZOFRAN) 4 MG tablet Take 1 tablet (4 mg total) by mouth every 8 (eight) hours as needed for nausea.  20 tablet  1  . pantoprazole (PROTONIX) 40 MG tablet Take 1 tablet by mouth  daily  90 tablet  1  . potassium chloride SA (K-DUR,KLOR-CON) 20 MEQ tablet Take 1 tablet (20 mEq total) by mouth daily.  90 tablet  1  . sertraline (ZOLOFT) 50 MG tablet Take 1 tablet (50 mg total) by mouth daily.  90 tablet  1  . simethicone (MYLICON) 80 MG chewable tablet Chew 2 tablets (160 mg total) by mouth every 6 (six) hours as needed for flatulence.  30 tablet  0   No current facility-administered medications for this visit.    Allergies:   Bactrim; Contrast media; Gadolinium derivatives; Iohexol; Penicillins; Sulfa antibiotics; Avelox; Ciprofloxacin; Delsym; and Hydromet   Social History:  The patient  reports that she quit smoking about 2 years ago. Her smoking use included Cigarettes. She has a 30 pack-year smoking history. She has never used smokeless tobacco. She reports that she does not drink alcohol or use illicit drugs.   Family History:  The patient's family history includes Breast cancer in her sister; Cancer in her father  and sister; Colon cancer in her sister; Esophageal cancer in her father; Heart disease in her mother, sister, and son.   ROS:  Please see  the history of present illness.    All other systems reviewed and negative.   PHYSICAL EXAM: VS:  BP 110/60  Pulse 84  Ht 5\' 5"  (1.651 m)  Wt 175 lb (79.379 kg)  BMI 29.12 kg/m2 Well nourished, well developed, in no acute distress HEENT: normal Neck: no JVD Cardiac:  normal S1, S2; RRR; no murmur Lungs:  Decreased breath sounds bilaterally, no wheezing, rhonchi or rales Abd: soft, nontender, no hepatomegaly Ext: no edema Skin: warm and dry Neuro:  CNs 2-12 intact, no focal abnormalities noted  EKG:  NSR, HR 84, normal axis, no acute changes     ASSESSMENT AND PLAN:  1. Palpitations:  Etiology not clear.  She seems to have had less of these since taking Coreg today.  Continue current Rx.  Arrange 24 hour Holter. 2. Chronic Diastolic CHF:  Volume stable.   3. CAD:  No angina.  Continue ASA. 4. Hypertension:  Controlled.  5. Disposition:  F/u with Dr. Zackery Barefoot as planned.   Signed, Tereso Newcomer, PA-C  09/09/2013 2:47 PM

## 2013-09-09 NOTE — Patient Instructions (Signed)
Your physician recommends that you continue on your current medications as directed. Please refer to the Current Medication list given to you today.   Your physician recommends that you KEEP YOUR follow-up appointment WITH DR. KATZ  Your physician has recommended that you wear a holter monitor. DX:  PALPITATIONS AND MONITOR PUT ON BY DG Holter monitors are medical devices that record the heart's electrical activity. Doctors most often use these monitors to diagnose arrhythmias. Arrhythmias are problems with the speed or rhythm of the heartbeat. The monitor is a small, portable device. You can wear one while you do your normal daily activities. This is usually used to diagnose what is causing palpitations/syncope (passing out).

## 2013-09-13 ENCOUNTER — Encounter: Payer: Self-pay | Admitting: Internal Medicine

## 2013-09-13 ENCOUNTER — Ambulatory Visit (INDEPENDENT_AMBULATORY_CARE_PROVIDER_SITE_OTHER): Payer: Medicare Other | Admitting: Internal Medicine

## 2013-09-13 VITALS — BP 132/70 | HR 91 | Temp 98.4°F | Wt 175.8 lb

## 2013-09-13 DIAGNOSIS — R05 Cough: Secondary | ICD-10-CM

## 2013-09-13 DIAGNOSIS — J309 Allergic rhinitis, unspecified: Secondary | ICD-10-CM

## 2013-09-13 MED ORDER — BENZONATATE 200 MG PO CAPS: 200.0000 mg | ORAL_CAPSULE | Freq: Every evening | ORAL | Status: DC | PRN

## 2013-09-13 NOTE — Progress Notes (Signed)
Pre-visit discussion using our clinic review tool. No additional management support is needed unless otherwise documented below in the visit note.  

## 2013-09-13 NOTE — Progress Notes (Signed)
Subjective:    Patient ID: Paula [name], female    DOB: [birth year], 79 y.o.   MRN: [id number]  Cough This is a recurrent problem. The current episode started today. The problem has been resolved. The cough is non-productive. Associated symptoms include myalgias (chronic). Pertinent negatives include no chest pain, fever, headaches, heartburn, nasal congestion, postnasal drip, rash, rhinorrhea, sore throat, shortness of breath, sweats or wheezing. The symptoms are aggravated by lying down. She has tried nothing for the symptoms. Her past medical history is significant for COPD and environmental allergies.   Past Medical History  Diagnosis Date  . Depression   . Ejection fraction     EF 40%, echo, November, 2012  / in improved, EF 60%, echo, December, 2012  . Contrast media allergy     Patient feels poorly with contrast  . Status post AAA (abdominal aortic aneurysm) repair     Surgical repair, Dr. Hart Rochester, December 27, 2011  . Pre-syncope     vasovagal w/ heart block  . Parotid mass     has refused further eval 10/2011  . GERD (gastroesophageal reflux disease)   . Anxiety   . Facial tic     L sided spasms - Botox trial summer 2013, ?effective  . Macular degeneration   . AAA (abdominal aortic aneurysm)     s/p repair 12/2011  . Congestive heart failure     NL EF 10/2011 echo   . Diabetes mellitus type II, controlled   . Hypothyroidism   . Sinus bradycardia 09/19/2011    Occurring simultaneously with complete heart block   . Hypertension   . COPD (chronic obstructive pulmonary disease)   . Abdominal spasms     hemifacial-treated w/ botox xeomin -Dr Terrace Arabia    Review of Systems  Constitutional: Negative for fever.  HENT: Negative for postnasal drip, rhinorrhea and sore throat.   Respiratory: Positive for cough. Negative for shortness of breath and wheezing.   Cardiovascular: Negative for chest pain.  Gastrointestinal: Negative for heartburn.  Musculoskeletal: Positive for myalgias  (chronic).  Skin: Negative for rash.  Allergic/Immunologic: Positive for environmental allergies.  Neurological: Negative for headaches.       Objective:   Physical Exam BP 132/70  Pulse 91  Temp(Src) 98.4 F (36.9 C) (Oral)  Wt 175 lb 12.8 oz (79.742 kg)  SpO2 98% Wt Readings from Last 3 Encounters:  09/13/13 175 lb 12.8 oz (79.742 kg)  09/09/13 175 lb (79.379 kg)  09/06/13 173 lb 1.9 oz (78.527 kg)   Constitutional: She appears well-developed and well-nourished. nontoxic. No distress. spouse at side. Neck: Normal range of motion. Neck supple. No JVD present. No thyromegaly present.  Cardiovascular: Normal rate, regular rhythm and normal heart sounds.  No murmur heard. No BLE edema. Pulmonary/Chest: Effort normal and breath sounds normal. No respiratory distress. She has no wheezes.  Psychiatric: She has a normal mood and affect. Her behavior is normal. Judgment and thought content normal.   Lab Results  Component Value Date   WBC 12.3* 08/22/2013   HGB 11.7* 08/22/2013   HCT 35.2* 08/22/2013   PLT 296 08/22/2013   GLUCOSE 142* 08/22/2013   CHOL 149 02/10/2013   TRIG 218.0* 02/10/2013   HDL 30.10* 02/10/2013   LDLDIRECT 90.6 02/10/2013   LDLCALC 80 11/04/2011   ALT 24 08/22/2013   AST 25 08/22/2013   NA 140 08/22/2013   K 4.0 08/22/2013   CL 103 08/22/2013   CREATININE 0.88 08/22/2013   BUN 13 08/22/2013  CO2 26 08/22/2013   TSH 0.359 08/22/2013   INR 1.11 07/11/2013   HGBA1C 6.6* 07/11/2013       Assessment & Plan:   Cough - no red flags on hx or exam -suspect allergic trigger Chronic and recurrent concerns reviewed re: same recommended tessalon qhs x 1 week, then prn Continue treatment for allergic rhinitis as ongoing Also Alb MDI prn inhalers for COPD as ongoing  Pt/spouse will call if symptoms worse or unimproved with conservative care

## 2013-09-13 NOTE — Patient Instructions (Addendum)
It was good to see you today.  Use tessalon at bedtime x 1 week to control cough, then as needed  Other Medications reviewed and updated, no other changes recommended at this time.  If you develop worsening symptoms or fever, call and we can reconsider antibiotics, but it does not appear necessary to use antibiotics at this time.  Cough, Adult  A cough is a reflex. It helps you clear your throat and airways. A cough can help heal your body. A cough can last 2 or 3 weeks (acute) or may last more than 8 weeks (chronic). Some common causes of a cough can include an infection, allergy, or a cold. HOME CARE  Only take medicine as told by your doctor.  If given, take your medicines (antibiotics) as told. Finish them even if you start to feel better.  Use a cold steam vaporizer or humidier in your home. This can help loosen thick spit (secretions).  Sleep so you are almost sitting up (semi-upright). Use pillows to do this. This helps reduce coughing.  Rest as needed.  Stop smoking if you smoke. GET HELP RIGHT AWAY IF:  You have yellowish-white fluid (pus) in your thick spit.  Your cough gets worse.  Your medicine does not reduce coughing, and you are losing sleep.  You cough up blood.  You have trouble breathing.  Your pain gets worse and medicine does not help.  You have a fever. MAKE SURE YOU:   Understand these instructions.  Will watch your condition.  Will get help right away if you are not doing well or get worse. Document Released: 07/11/2011 Document Revised: 01/20/2012 Document Reviewed: 07/11/2011 [facility] Patient Information 2014 [place], [place].

## 2013-09-14 ENCOUNTER — Telehealth: Payer: Self-pay | Admitting: Physician Assistant

## 2013-09-14 NOTE — Telephone Encounter (Signed)
New Problem:  Pt's husband states he is calling for the results of his wife's heart monitor. Please advise

## 2013-09-15 NOTE — Telephone Encounter (Signed)
lmom per Burnett Harry in monitor room at [facility]. office Dr. Myrtis Ser has the monitor report; once report has been read we will call to let you know the results

## 2013-09-15 NOTE — Telephone Encounter (Signed)
lmom per Shelly in monitor room at [address] office Dr. Katz has the monitor report; once report has been read we will call to let you know the results 

## 2013-09-16 NOTE — Telephone Encounter (Addendum)
Pt is scheduled to see Dr. Myrtis Ser on 09/24/13. The pt is advised that while she wore the monitor we did not receive any serious or critical calls from [facility] and that we do have her results but Dr Myrtis Ser has not reviewed at this time. Pt is advised that I will call her with results when available. She verbalized understaning.

## 2013-09-16 NOTE — Telephone Encounter (Signed)
Will forward to Lynn.

## 2013-09-16 NOTE — Telephone Encounter (Signed)
New Problem  Pt was wearing a 24 hr monitor, requests results

## 2013-09-20 ENCOUNTER — Ambulatory Visit: Payer: Medicare Other | Admitting: Adult Health

## 2013-09-24 ENCOUNTER — Other Ambulatory Visit: Payer: Self-pay | Admitting: Cardiology

## 2013-09-24 ENCOUNTER — Ambulatory Visit (INDEPENDENT_AMBULATORY_CARE_PROVIDER_SITE_OTHER): Payer: Medicare Other | Admitting: Cardiology

## 2013-09-24 ENCOUNTER — Encounter: Payer: Self-pay | Admitting: Cardiology

## 2013-09-24 VITALS — BP 112/72 | HR 80 | Ht 65.0 in | Wt 177.0 lb

## 2013-09-24 DIAGNOSIS — I509 Heart failure, unspecified: Secondary | ICD-10-CM

## 2013-09-24 DIAGNOSIS — R001 Bradycardia, unspecified: Secondary | ICD-10-CM

## 2013-09-24 DIAGNOSIS — I5032 Chronic diastolic (congestive) heart failure: Secondary | ICD-10-CM

## 2013-09-24 DIAGNOSIS — I498 Other specified cardiac arrhythmias: Secondary | ICD-10-CM

## 2013-09-24 NOTE — Assessment & Plan Note (Signed)
In the past the patient had one episode of significant vasovagal syncope. We do not have any ongoing proof of significant bradycardia arrhythmias. She has limited bradycardia while sleeping at 4 AM. No further workup.

## 2013-09-24 NOTE — Patient Instructions (Signed)
Your physician recommends that you continue on your current medications as directed. Please refer to the Current Medication list given to you today.  Your physician wants you to follow-up in: 1 year. You will receive a reminder letter in the mail two months in advance. If you don't receive a letter, please call our office to schedule the follow-up appointment.  

## 2013-09-24 NOTE — Assessment & Plan Note (Addendum)
Patient's overall volume status is stable. No change in therapy.  The patient is cleared for any type of physical therapy that is appropriate. Her cardiac status is stable.

## 2013-09-24 NOTE — Progress Notes (Signed)
HPI  Patient is seen back today to followup her overall cardiac status. She's also seen to reassess her palpitations and to be sure that it's okay for her to return to rehabilitation. Today she tells me that she is not having significant palpitations. She is here with her husband and the communication is much clearer with him.  The patient was seen last in the office by Mr. Alben Spittle on September 09, 2013, decision was made for her to wear a Holter monitor. The study showed no significant tachyarrhythmias. There were scattered PVCs. While the patient is sleeping at 4 AM she has some Wenckebach. There is rare 2-1 block. This is not clinically significant while she is sleeping.  Allergies  Allergen Reactions  . Bactrim [Sulfamethoxazole-Tmp Ds]   . Contrast Media [Iodinated Diagnostic Agents] Anaphylaxis  . Gadolinium Derivatives   . Iohexol Anaphylaxis, Shortness Of Breath and Swelling     Desc: PT STATES SHE HAD A SEVERE REACTION TO IV CONRAST WITH THROAT SWELLING AND SOB. SHE WAS ADMITTED TO THE HOSPITAL. SHE HAS NEVER HAD CONTRAST AGAIN.   Marland Kitchen Penicillins Rash  . Sulfa Antibiotics Anaphylaxis  . Avelox [Moxifloxacin Hcl In Nacl] Rash  . Ciprofloxacin Itching  . Delsym [Dextromethorphan] Other (See Comments)    Hallucination  . Hydromet [Hydrocodone-Homatropine] Other (See Comments)    Pt states med make her hallucinate    Current Outpatient Prescriptions  Medication Sig Dispense Refill  . albuterol (PROVENTIL HFA;VENTOLIN HFA) 108 (90 BASE) MCG/ACT inhaler Inhale 2 puffs into the lungs every 6 (six) hours as needed for wheezing.  1 Inhaler  2  . ALPRAZolam (XANAX) 1 MG tablet Take 1 mg by mouth 3 (three) times daily as needed for anxiety.      Marland Kitchen aspirin EC 81 MG tablet Take 81 mg by mouth every morning.       . baclofen (LIORESAL) 10 MG tablet Take 5-10 mg by mouth 2 (two) times daily as needed (for muscle spasms).      . benzonatate (TESSALON) 200 MG capsule Take 1 capsule (200 mg  total) by mouth at bedtime as needed for cough.  20 capsule  0  . calcium carbonate (OS-CAL) 600 MG TABS Take 600 mg by mouth every other day.       . carvedilol (COREG) 6.25 MG tablet Take 1 tablet (6.25 mg total) by mouth 2 (two) times daily with a meal.  180 tablet  1  . cetirizine (ZYRTEC) 10 MG tablet Take 10 mg by mouth daily as needed for allergies.      . Cholecalciferol (VITAMIN D3) 5000 UNITS TABS Take 1 tablet by mouth every other day.       . furosemide (LASIX) 20 MG tablet Take 1 tablet (20 mg total) by mouth daily.  30 tablet  0  . guaiFENesin (MUCINEX) 600 MG 12 hr tablet Take 1 tablet (600 mg total) by mouth 2 (two) times daily.  60 tablet  5  . Ipratropium-Albuterol (COMBIVENT) 20-100 MCG/ACT AERS respimat Inhale 1 puff into the lungs every 6 (six) hours as needed for wheezing or shortness of breath.  4 g  3  . levothyroxine (SYNTHROID, LEVOTHROID) 175 MCG tablet Take 1 tablet (175 mcg total) by mouth daily.  30 tablet  0  . losartan-hydrochlorothiazide (HYZAAR) 50-12.5 MG per tablet Take 1 tablet by mouth daily.       . metFORMIN (GLUCOPHAGE-XR) 500 MG 24 hr tablet Take 1 tablet (500 mg total) by mouth daily with  breakfast.  90 tablet  2  . nystatin (MYCOSTATIN) 100000 UNIT/ML suspension Take 5 mLs (500,000 Units total) by mouth 4 (four) times daily.  60 mL  0  . ondansetron (ZOFRAN) 4 MG tablet Take 1 tablet (4 mg total) by mouth every 8 (eight) hours as needed for nausea.  20 tablet  1  . pantoprazole (PROTONIX) 40 MG tablet Take 1 tablet by mouth  daily  90 tablet  1  . potassium chloride SA (K-DUR,KLOR-CON) 20 MEQ tablet Take 1 tablet (20 mEq total) by mouth daily.  90 tablet  1  . sertraline (ZOLOFT) 50 MG tablet Take 1 tablet (50 mg total) by mouth daily.  90 tablet  1  . simethicone (MYLICON) 80 MG chewable tablet Chew 2 tablets (160 mg total) by mouth every 6 (six) hours as needed for flatulence.  30 tablet  0   No current facility-administered medications for this visit.      History   Social History  . Marital Status: Married    Spouse Name: N/A    Number of Children: 4  . Years of Education: N/A   Occupational History  . retired Other    worked for 25 years, hairdresser   Social History Main Topics  . Smoking status: Former Smoker -- 1.00 packs/day for 30 years    Types: Cigarettes    Quit date: 08/31/2011  . Smokeless tobacco: Never Used  . Alcohol Use: No  . Drug Use: No  . Sexual Activity: Not on file   Other Topics Concern  . Not on file   Social History Narrative  . No narrative on file    Family History  Problem Relation Age of Onset  . Esophageal cancer Father   . Cancer Father   . Breast cancer Sister   . Cancer Sister   . Colon cancer Sister   . Heart disease Sister   . Heart disease Mother   . Heart disease Son     Past Medical History  Diagnosis Date  . Depression   . Ejection fraction     EF 40%, echo, November, 2012  / in improved, EF 60%, echo, December, 2012  . Contrast media allergy     Patient feels poorly with contrast  . Status post AAA (abdominal aortic aneurysm) repair     Surgical repair, Dr. Hart Rochester, December 27, 2011  . Pre-syncope     vasovagal w/ heart block  . Parotid mass     has refused further eval 10/2011  . GERD (gastroesophageal reflux disease)   . Anxiety   . Facial tic     L sided spasms - Botox trial summer 2013, ?effective  . Macular degeneration   . AAA (abdominal aortic aneurysm)     s/p repair 12/2011  . Congestive heart failure     NL EF 10/2011 echo   . Diabetes mellitus type II, controlled   . Hypothyroidism   . Sinus bradycardia 09/19/2011    Occurring simultaneously with complete heart block   . Hypertension   . COPD (chronic obstructive pulmonary disease)   . Abdominal spasms     hemifacial-treated w/ botox xeomin -Dr Terrace Arabia    Past Surgical History  Procedure Laterality Date  . Cholecystectomy    . Knee cartilage surgery      left  . Sinus surgery with instatrak     . Appendectomy    . Oophorectomy      ovarian cyst  . Cataract extraction  bilateral  . Bladder repair    . US echocardiography  10/14/11  . Abdominal aortic aneurysm repair  12/27/2011    Procedure: ANEURYSM ABDOMINAL AORTIC REPAIR;  Surgeon: Josephina Gip, MD;  [facility] OR;  Service: Vascular;  Laterality: N/A;  Resection and Grafting Abdominal Aortic Aneurysm , Aorta Bi Iliac.  Marland Kitchen Pacemaker insertion  11/12  . Cardiac catheterization  11/04/11    Patient Active Problem List   Diagnosis Date Noted  . Lumbago   . Facial spasm 04/08/2013  . Dementia 03/08/2013  . Headache 02/19/2013  . Chronic diastolic CHF (congestive heart failure) 02/11/2013  . Chronic asthmatic bronchitis with acute exacerbation 01/27/2013  . Hypertension   . Recurrent pneumonia   . AAA (abdominal aortic aneurysm) 07/07/2012  . Status post AAA (abdominal aortic aneurysm) repair   . Pre-syncope   . Depression 10/25/2011  . Anxiety 10/25/2011  . Facial twitching 10/13/2011  . Ejection fraction   . Contrast media allergy   . Hypothyroidism 09/19/2011  . Sinus bradycardia 09/19/2011  . Parotid mass 09/18/2011  . COPD (chronic obstructive pulmonary disease) with emphysema 09/15/2011  . Diabetes mellitus type II, controlled 09/15/2011    ROS   Patient denies fever, chills, headache, sweats, rash, change in vision, change in hearing, chest pain, cough, nausea vomiting, urinary symptoms. All other systems are reviewed and are negative.  PHYSICAL EXAM   Patient is stable. She's here with her husband. She is oriented to person. There is no jugulovenous distention. Lungs are clear. Respiratory effort is nonlabored. Cardiac exam reveals S1 and S2. Abdomen is soft. There is no peripheral edema.  Filed Vitals:   09/24/13 1004  BP: 112/72  Pulse: 80  Height: 5\' 5"  (1.651 m)  Weight: 177 lb (80.287 kg)  SpO2: 97%     ASSESSMENT & PLAN

## 2013-09-29 ENCOUNTER — Ambulatory Visit: Payer: Medicare Other | Admitting: Nurse Practitioner

## 2013-10-04 ENCOUNTER — Ambulatory Visit: Payer: Medicare Other | Admitting: Internal Medicine

## 2013-10-05 ENCOUNTER — Encounter: Payer: Self-pay | Admitting: Internal Medicine

## 2013-10-05 ENCOUNTER — Ambulatory Visit (INDEPENDENT_AMBULATORY_CARE_PROVIDER_SITE_OTHER): Payer: Medicare Other | Admitting: Internal Medicine

## 2013-10-05 VITALS — BP 120/68 | HR 85 | Temp 98.3°F | Wt 178.0 lb

## 2013-10-05 DIAGNOSIS — F411 Generalized anxiety disorder: Secondary | ICD-10-CM

## 2013-10-05 DIAGNOSIS — R110 Nausea: Secondary | ICD-10-CM

## 2013-10-05 DIAGNOSIS — F419 Anxiety disorder, unspecified: Secondary | ICD-10-CM

## 2013-10-05 DIAGNOSIS — R05 Cough: Secondary | ICD-10-CM

## 2013-10-05 MED ORDER — PREDNISONE (PAK) 10 MG PO TABS: ORAL_TABLET | ORAL | Status: DC

## 2013-10-05 MED ORDER — DOXYCYCLINE HYCLATE 100 MG PO TABS: 100.0000 mg | ORAL_TABLET | Freq: Two times a day (BID) | ORAL | Status: AC

## 2013-10-05 NOTE — Progress Notes (Signed)
Pre-visit discussion using our clinic review tool. No additional management support is needed unless otherwise documented below in the visit note.  

## 2013-10-05 NOTE — Patient Instructions (Signed)
It was good to see you today.  Use doxycycline antibiotics and prednisone taper for cough - Your prescription(s) have been submitted to your pharmacy. Please take as directed and contact our office if you believe you are having problem(s) with the medication(s).  Other Medications reviewed and updated, no other changes recommended at this time.  Cough, Adult  A cough is a reflex. It helps you clear your throat and airways. A cough can help heal your body. A cough can last 2 or 3 weeks (acute) or may last more than 8 weeks (chronic). Some common causes of a cough can include an infection, allergy, or a cold. HOME CARE  Only take medicine as told by your doctor.  If given, take your medicines (antibiotics) as told. Finish them even if you start to feel better.  Use a cold steam vaporizer or humidier in your home. This can help loosen thick spit (secretions).  Sleep so you are almost sitting up (semi-upright). Use pillows to do this. This helps reduce coughing.  Rest as needed.  Stop smoking if you smoke. GET HELP RIGHT AWAY IF:  You have yellowish-white fluid (pus) in your thick spit.  Your cough gets worse.  Your medicine does not reduce coughing, and you are losing sleep.  You cough up blood.  You have trouble breathing.  Your pain gets worse and medicine does not help.  You have a fever. MAKE SURE YOU:   Understand these instructions.  Will watch your condition.  Will get help right away if you are not doing well or get worse. Document Released: 07/11/2011 Document Revised: 01/20/2012 Document Reviewed: 07/11/2011 [facility] Patient Information 2014 [place], [place].

## 2013-10-05 NOTE — Assessment & Plan Note (Signed)
Significant chronic anxiety (spouse and pt), likely contributing to chronic "unwell" symptoms Review and reassurance provided to patient and spouse today Continue same sertraline  Last increased BZ to TID 02/2013 S/p counseling with gutterman 02/2013 x 1 OV, to follow up prn  

## 2013-10-05 NOTE — Progress Notes (Signed)
Subjective:    Patient ID: Paula [name], female    DOB: 1927-02-19, 79 y.o.   MRN: [id number]  HPI  Reports recurrent cough - Persisting chronic "sick" feeling Concerned taking too many medications causing her to be sick  Past Medical History  Diagnosis Date  . Depression   . Ejection fraction     EF 40%, echo, November, 2012  / in improved, EF 60%, echo, December, 2012  . Contrast media allergy     Patient feels poorly with contrast  . Status post AAA (abdominal aortic aneurysm) repair     Surgical repair, Dr. Hart Rochester, December 27, 2011  . Pre-syncope     vasovagal w/ heart block  . Parotid mass     has refused further eval 10/2011  . GERD (gastroesophageal reflux disease)   . Anxiety   . Facial tic     L sided spasms - Botox trial summer 2013, ?effective  . Macular degeneration   . AAA (abdominal aortic aneurysm)     s/p repair 12/2011  . Congestive heart failure     NL EF 10/2011 echo   . Diabetes mellitus type II, controlled   . Hypothyroidism   . Sinus bradycardia 09/19/2011    Occurring simultaneously with complete heart block   . Hypertension   . COPD (chronic obstructive pulmonary disease)   . Abdominal spasms     hemifacial-treated w/ botox xeomin -Dr Terrace Arabia    Review of Systems  Constitutional: Positive for fatigue. Negative for fever and unexpected weight change.  Respiratory: Positive for cough. Negative for chest tightness, shortness of breath and wheezing.   Cardiovascular: Negative for chest pain and leg swelling.  Gastrointestinal: Positive for nausea. Negative for abdominal pain, diarrhea, constipation and abdominal distention.       Objective:   Physical Exam BP 120/68  Pulse 85  Temp(Src) 98.3 F (36.8 C) (Oral)  Wt 178 lb (80.74 kg)  SpO2 90% Wt Readings from Last 3 Encounters:  10/05/13 178 lb (80.74 kg)  09/24/13 177 lb (80.287 kg)  09/13/13 175 lb 12.8 oz (79.742 kg)   Constitutional: She appears well-developed and well-nourished.  nontoxic. No distress. spouse at side. Neck: Normal range of motion. Neck supple. No JVD present. No thyromegaly present.  Cardiovascular: Normal rate, regular rhythm and normal heart sounds.  No murmur heard. No BLE edema. Pulmonary/Chest: Effort normal and breath sounds normal. No respiratory distress. She has no wheezes.  Abdomen: SNDNT+BS Psychiatric: She has a normal mood and affect. Her behavior is normal. Judgment and thought content normal.   Lab Results  Component Value Date   WBC 12.3* 08/22/2013   HGB 11.7* 08/22/2013   HCT 35.2* 08/22/2013   PLT 296 08/22/2013   GLUCOSE 142* 08/22/2013   CHOL 149 02/10/2013   TRIG 218.0* 02/10/2013   HDL 30.10* 02/10/2013   LDLDIRECT 90.6 02/10/2013   LDLCALC 80 11/04/2011   ALT 24 08/22/2013   AST 25 08/22/2013   NA 140 08/22/2013   K 4.0 08/22/2013   CL 103 08/22/2013   CREATININE 0.88 08/22/2013   BUN 13 08/22/2013   CO2 26 08/22/2013   TSH 0.359 08/22/2013   INR 1.11 07/11/2013   HGBA1C 6.6* 07/11/2013       Assessment & Plan:   Cough - chronic but mild hypoxia today - Reviewed CXR 08/23/2013 and CT chest 05/2013 No evidence for CHF/volume overload on exam Prior card and pulm evals again reviewed tx with pred taper and empiric antibiotics  Pt/spouse will call if symptoms worse or unimproved with rx'd care   Nausea - Abn pancreatic change and CT a/p 02/2013 and 05/2013 - Labs last month ok - discussed recheck CT as advised, but pt declines at this time. Continue Zofran prn

## 2013-10-06 ENCOUNTER — Ambulatory Visit (INDEPENDENT_AMBULATORY_CARE_PROVIDER_SITE_OTHER): Payer: Medicare Other | Admitting: Nurse Practitioner

## 2013-10-06 ENCOUNTER — Encounter: Payer: Self-pay | Admitting: Nurse Practitioner

## 2013-10-06 VITALS — BP 131/68 | HR 80 | Ht 65.0 in | Wt 180.0 lb

## 2013-10-06 DIAGNOSIS — G518 Other disorders of facial nerve: Secondary | ICD-10-CM

## 2013-10-06 DIAGNOSIS — G5139 Clonic hemifacial spasm, unspecified: Secondary | ICD-10-CM

## 2013-10-06 DIAGNOSIS — G514 Facial myokymia: Secondary | ICD-10-CM

## 2013-10-06 DIAGNOSIS — F0390 Unspecified dementia without behavioral disturbance: Secondary | ICD-10-CM

## 2013-10-06 NOTE — Patient Instructions (Signed)
MMSE score is stable Use walker at all times for safe ambulation F/U with Dr. Terrace Arabia for [facility] injections F/U with Dr. Vickey Huger 6 months

## 2013-10-06 NOTE — Progress Notes (Signed)
[facility]  PATIENT: Paula [name] DOB: Mar 02, 1927   REASON FOR VISIT: Followup for memory loss and facial twitching    HISTORY OF PRESENT ILLNESS: Paula [name], 79 year old returns for followup. She has history of memory loss with stable MMSE. She also has facial twitching and receives XEOMEN every 3 months which has improved her facial twitching. She also takes baclofen  MRI of brain in December 2012, there was mild atrophy, extensive periventricular white matter disease, MRA of the brain showed no large vessel disease. He had a recent hospital admission for congestive heart failure because she did not take her Lasix for several days. She has no new neurologic complaints    HISTORY: Has been followed over 2 years in our practice and initially presented with a chief complaint of left hemifacial spasms. She had received the omentum at [facility] and I am the last injection in September 2013 I. correct the last injection on 12/31/2012 but rarely has a visible left facial tremor after this anymore however chief he'll the tremor is. Recovering from pneumonia and was recently admitted at [facility]. Her history of the facial spasms reaches back over 7 years. A MRI in December 2012 showed mild brain atrophy but extensive white matter disease nor large vessel disease was found, Her memory scores have been declining: she has also macular degeneration bilaterally especially acute worsening of the right vision  . She status post aortic aneurysm repair in February 2013 .  She is again complaining about nausea and facial twitches. Reviewed her hospital notes, as the patient is not able to give details. She is under the impression that EMG or EEG are to be repeated here.   Today score on the MOCA was 14 points out of 30 - she has no MMSE score ? The last one in Feb was 28 points. AFT 15 points.  The patient is on Synthroid, baclofen, aspirin ,HCTZ , vitamin D ,calcium, Xanax, Lodine capsules and  guaifenesin. She completed a course of azithromycin without success. She receives XEOMIN injections through Dr. Terrace Arabia      REVIEW OF SYSTEMS: Full 14 system review of systems performed and notable only for:  Constitutional: N/A  Cardiovascular: N/A  Ear/Nose/Throat: N/A  Skin: N/A  Eyes: N/A  Respiratory: Cough  Gastroitestinal: N/A  Hematology/Lymphatic: N/A  Endocrine: N/A Musculoskeletal: Joint pain  Allergy/Immunology: N/A  Neurological: N/A Psychiatric: N/A   ALLERGIES: Allergies  Allergen Reactions  . Bactrim [Sulfamethoxazole-Tmp Ds]   . Contrast Media [Iodinated Diagnostic Agents] Anaphylaxis  . Gadolinium Derivatives   . Iohexol Anaphylaxis, Shortness Of Breath and Swelling     Desc: PT STATES SHE HAD A SEVERE REACTION TO IV CONRAST WITH THROAT SWELLING AND SOB. SHE WAS ADMITTED TO THE HOSPITAL. SHE HAS NEVER HAD CONTRAST AGAIN.   Marland Kitchen Penicillins Rash  . Sulfa Antibiotics Anaphylaxis  . Avelox [Moxifloxacin Hcl In Nacl] Rash  . Ciprofloxacin Itching  . Delsym [Dextromethorphan] Other (See Comments)    Hallucination  . Hydromet [Hydrocodone-Homatropine] Other (See Comments)    Pt states med make her hallucinate    HOME MEDICATIONS: Outpatient Prescriptions Prior to Visit  Medication Sig Dispense Refill  . albuterol (PROVENTIL HFA;VENTOLIN HFA) 108 (90 BASE) MCG/ACT inhaler Inhale 2 puffs into the lungs every 6 (six) hours as needed for wheezing.  1 Inhaler  2  . ALPRAZolam (XANAX) 1 MG tablet Take 1 mg by mouth 3 (three) times daily as needed for anxiety.      Marland Kitchen  aspirin EC 81 MG tablet Take 81 mg by mouth every morning.       . baclofen (LIORESAL) 10 MG tablet Take 5-10 mg by mouth 2 (two) times daily as needed (for muscle spasms).      . benzonatate (TESSALON) 200 MG capsule Take 1 capsule (200 mg total) by mouth at bedtime as needed for cough.  20 capsule  0  . calcium carbonate (OS-CAL) 600 MG TABS Take 600 mg by mouth every other day.       . carvedilol  (COREG) 6.25 MG tablet Take 1 tablet (6.25 mg total) by mouth 2 (two) times daily with a meal.  180 tablet  1  . cetirizine (ZYRTEC) 10 MG tablet Take 10 mg by mouth daily as needed for allergies.      . Cholecalciferol (VITAMIN D3) 5000 UNITS TABS Take 1 tablet by mouth every other day.       Marland Kitchen doxycycline (VIBRA-TABS) 100 MG tablet Take 1 tablet (100 mg total) by mouth 2 (two) times daily.  14 tablet  0  . furosemide (LASIX) 20 MG tablet Take 1 tablet (20 mg total) by mouth daily.  30 tablet  0  . guaiFENesin (MUCINEX) 600 MG 12 hr tablet Take 1 tablet (600 mg total) by mouth 2 (two) times daily.  60 tablet  5  . Ipratropium-Albuterol (COMBIVENT) 20-100 MCG/ACT AERS respimat Inhale 1 puff into the lungs every 6 (six) hours as needed for wheezing or shortness of breath.  4 g  3  . levothyroxine (SYNTHROID, LEVOTHROID) 175 MCG tablet Take 1 tablet (175 mcg total) by mouth daily.  30 tablet  0  . losartan-hydrochlorothiazide (HYZAAR) 50-12.5 MG per tablet Take 1 tablet by mouth daily.       . metFORMIN (GLUCOPHAGE-XR) 500 MG 24 hr tablet Take 1 tablet (500 mg total) by mouth daily with breakfast.  90 tablet  2  . ondansetron (ZOFRAN) 4 MG tablet Take 1 tablet (4 mg total) by mouth every 8 (eight) hours as needed for nausea.  20 tablet  1  . pantoprazole (PROTONIX) 40 MG tablet Take 1 tablet by mouth  daily  90 tablet  1  . potassium chloride SA (K-DUR,KLOR-CON) 20 MEQ tablet Take 1 tablet (20 mEq total) by mouth daily.  90 tablet  1  . predniSONE (STERAPRED UNI-PAK) 10 MG tablet Take by mouth as directed. As directed x 6 days  21 tablet  0  . sertraline (ZOLOFT) 50 MG tablet Take 1 tablet (50 mg total) by mouth daily.  90 tablet  1   No facility-administered medications prior to visit.    PAST MEDICAL HISTORY: Past Medical History  Diagnosis Date  . Depression   . Ejection fraction     EF 40%, echo, November, 2012  / in improved, EF 60%, echo, December, 2012  . Contrast media allergy      Patient feels poorly with contrast  . Status post AAA (abdominal aortic aneurysm) repair     Surgical repair, Dr. Hart Rochester, December 27, 2011  . Pre-syncope     vasovagal w/ heart block  . Parotid mass     has refused further eval 10/2011  . GERD (gastroesophageal reflux disease)   . Anxiety   . Facial tic     L sided spasms - Botox trial summer 2013, ?effective  . Macular degeneration   . AAA (abdominal aortic aneurysm)     s/p repair 12/2011  . Congestive heart failure  NL EF 10/2011 echo   . Diabetes mellitus type II, controlled   . Hypothyroidism   . Sinus bradycardia 09/19/2011    Occurring simultaneously with complete heart block   . Hypertension   . COPD (chronic obstructive pulmonary disease)   . Abdominal spasms     hemifacial-treated w/ botox xeomin -Dr Terrace Arabia    PAST SURGICAL HISTORY: Past Surgical History  Procedure Laterality Date  . Cholecystectomy    . Knee cartilage surgery      left  . Sinus surgery with instatrak    . Appendectomy    . Oophorectomy      ovarian cyst  . Cataract extraction      bilateral  . Bladder repair    . US echocardiography  10/14/11  . Abdominal aortic aneurysm repair  12/27/2011    Procedure: ANEURYSM ABDOMINAL AORTIC REPAIR;  Surgeon: Josephina Gip, MD;  Location: [facility] OR;  Service: Vascular;  Laterality: N/A;  Resection and Grafting Abdominal Aortic Aneurysm , Aorta Bi Iliac.  Marland Kitchen Pacemaker insertion  11/12  . Cardiac catheterization  11/04/11    FAMILY HISTORY: Family History  Problem Relation Age of Onset  . Esophageal cancer Father   . Cancer Father   . Breast cancer Sister   . Cancer Sister   . Colon cancer Sister   . Heart disease Sister   . Heart disease Mother   . Heart disease Son     SOCIAL HISTORY: History   Social History  . Marital Status: Married    Spouse Name: N/A    Number of Children: 4  . Years of Education: N/A   Occupational History  . retired Other    worked for 25 years, hairdresser   Social  History Main Topics  . Smoking status: Former Smoker -- 1.00 packs/day for 30 years    Types: Cigarettes    Quit date: 08/31/2011  . Smokeless tobacco: Never Used  . Alcohol Use: No  . Drug Use: No  . Sexual Activity: Not on file   Other Topics Concern  . Not on file   Social History Narrative   Patient is married [name]) and lives at home with her husband.   Patient worked as a Interior and spatial designer for 25 years.   Patient drinks a caffeine beverage daily.   Patient is right-handed.     PHYSICAL EXAM  Filed Vitals:   10/06/13 0908  BP: 131/68  Pulse: 80  Height: 5\' 5"  (1.651 m)  Weight: 180 lb (81.647 kg)   Body mass index is 29.95 kg/(m^2).  Generalized: Well developed, in no acute distress  Head: normocephalic and atraumatic,. Oropharynx benign  Neck: Supple, no carotid bruits  Cardiac: Regular rate rhythm, no murmur  Musculoskeletal: No deformity   Neurological examination   Mentation: Alert oriented to time, place, history taking.MMSE 26/30. AFT 12.  Follows all commands speech and language fluent  Cranial nerve II-XII: Pupils were equal round reactive to light extraocular movements were full, visual field were full on confrontational test. Facial sensation and strength were normal. hearing was intact to finger rubbing bilaterally. Uvula tongue midline. head turning and shoulder shrug and were normal and symmetric.Tongue protrusion into cheek strength was normal. Motor: normal bulk and tone, full strength in the BUE, BLE, fine finger movements normal, no pronator drift. No focal weakness Sensory: normal and symmetric to light touch, pinprick, and  vibration  Coordination: finger-nose-finger, heel-to-shin bilaterally, no dysmetria Reflexes: Brachioradialis 2/2, biceps 2/2, triceps 2/2, patellar 2/2, Achilles 2/2, plantar  responses were flexor bilaterally. Gait and Station: Rising up from seated position without assistance, wide based  moderate stride, ambulates with a walker  DIAGNOSTIC DATA (LABS, IMAGING, TESTING) - I reviewed patient records, labs, notes, testing and imaging myself where available.  Lab Results  Component Value Date   WBC 12.3* 08/22/2013   HGB 11.7* 08/22/2013   HCT 35.2* 08/22/2013   MCV 102.6* 08/22/2013   PLT 296 08/22/2013      Component Value Date/Time   NA 140 08/22/2013 1100   K 4.0 08/22/2013 1100   CL 103 08/22/2013 1100   CO2 26 08/22/2013 1100   GLUCOSE 142* 08/22/2013 1100   BUN 13 08/22/2013 1100   CREATININE 0.88 08/22/2013 1100   CREATININE 0.80 12/23/2011 0824   CALCIUM 10.0 08/22/2013 1100   PROT 6.3 08/22/2013 1100   ALBUMIN 3.7 08/22/2013 1100   AST 25 08/22/2013 1100   ALT 24 08/22/2013 1100   ALKPHOS 64 08/22/2013 1100   BILITOT 0.4 08/22/2013 1100   GFRNONAA 59* 08/22/2013 1100   GFRAA 68* 08/22/2013 1100   Lab Results  Component Value Date   CHOL 149 02/10/2013   HDL 30.10* 02/10/2013   LDLCALC 80 11/04/2011   LDLDIRECT 90.6 02/10/2013   TRIG 218.0* 02/10/2013   CHOLHDL 5 02/10/2013   Lab Results  Component Value Date   HGBA1C 6.6* 07/11/2013    Lab Results  Component Value Date   TSH 0.359 08/22/2013      ASSESSMENT AND PLAN  79 y.o. year old female  has a past medical history of long-standing left knee facial spasm since 2011 and received Xeomin every 3 months. He also takes baclofen. Memory loss is stable MMSE today 26/29 she cannot see the paper to copy the figure  Use walker at all times for safe ambulation F/U with Dr. Terrace Arabia for [facility] injections in Jan  F/U with Dr. Vickey Huger 6 months Nilda Riggs, [facility], [facility], APRN  [facility] [address], [address] [place], [place] [address] [phone]

## 2013-10-12 ENCOUNTER — Other Ambulatory Visit: Payer: Self-pay | Admitting: Internal Medicine

## 2013-10-12 ENCOUNTER — Ambulatory Visit (INDEPENDENT_AMBULATORY_CARE_PROVIDER_SITE_OTHER): Payer: Medicare Other | Admitting: Internal Medicine

## 2013-10-12 ENCOUNTER — Encounter: Payer: Self-pay | Admitting: Internal Medicine

## 2013-10-12 VITALS — BP 120/72 | HR 74 | Temp 98.6°F | Wt 177.0 lb

## 2013-10-12 DIAGNOSIS — I10 Essential (primary) hypertension: Secondary | ICD-10-CM

## 2013-10-12 DIAGNOSIS — J069 Acute upper respiratory infection, unspecified: Secondary | ICD-10-CM

## 2013-10-12 DIAGNOSIS — J309 Allergic rhinitis, unspecified: Secondary | ICD-10-CM | POA: Insufficient documentation

## 2013-10-12 MED ORDER — BENZONATATE 200 MG PO CAPS: 200.0000 mg | ORAL_CAPSULE | Freq: Every evening | ORAL | Status: DC | PRN

## 2013-10-12 MED ORDER — MOMETASONE FUROATE 50 MCG/ACT NA SUSP: 2.0000 | Freq: Every day | NASAL | Status: DC

## 2013-10-12 MED ORDER — PREDNISONE (PAK) 10 MG PO TABS: ORAL_TABLET | ORAL | Status: DC

## 2013-10-12 NOTE — Assessment & Plan Note (Signed)
Add nasal steroid, encourage oral antihistamine compliance

## 2013-10-12 NOTE — Patient Instructions (Addendum)
It was good to see you today.  Use Nasonex nose spray every day, tessalon perles and prednisone taper for cough - Your prescription(s) have been submitted to your pharmacy. Please take as directed and contact our office if you believe you are having problem(s) with the medication(s).  Other Medications reviewed and updated, no other changes recommended at this time.  If you develop worsening symptoms or fever, call and we can reconsider antibiotics, but it does not appear necessary to use antibiotics at this time.    Upper Respiratory Infection, Adult An upper respiratory infection (URI) is also sometimes known as the common cold. The upper respiratory tract includes the nose, sinuses, throat, trachea, and bronchi. Bronchi are the airways leading to the lungs. Most people improve within 1 week, but symptoms can last up to 2 weeks. A residual cough may last even longer.  CAUSES Many different viruses can infect the tissues lining the upper respiratory tract. The tissues become irritated and inflamed and often become very moist. Mucus production is also common. A cold is contagious. You can easily spread the virus to others by oral contact. This includes kissing, sharing a glass, coughing, or sneezing. Touching your mouth or nose and then touching a surface, which is then touched by another person, can also spread the virus. SYMPTOMS  Symptoms typically develop 1 to 3 days after you come in contact with a cold virus. Symptoms vary from person to person. They may include:  Runny nose.  Sneezing.  Nasal congestion.  Sinus irritation.  Sore throat.  Loss of voice (laryngitis).  Cough.  Fatigue.  Muscle aches.  Loss of appetite.  Headache.  Low-grade fever. DIAGNOSIS  You might diagnose your own cold based on familiar symptoms, since most people get a cold 2 to 3 times a year. Your caregiver can confirm this based on your exam. Most importantly, your caregiver can check that your  symptoms are not due to another disease such as strep throat, sinusitis, pneumonia, asthma, or epiglottitis. Blood tests, throat tests, and X-rays are not necessary to diagnose a common cold, but they may sometimes be helpful in excluding other more serious diseases. Your caregiver will decide if any further tests are required. RISKS AND COMPLICATIONS  You may be at risk for a more severe case of the common cold if you smoke cigarettes, have chronic heart disease (such as heart failure) or lung disease (such as asthma), or if you have a weakened immune system. The very young and very old are also at risk for more serious infections. Bacterial sinusitis, middle ear infections, and bacterial pneumonia can complicate the common cold. The common cold can worsen asthma and chronic obstructive pulmonary disease (COPD). Sometimes, these complications can require emergency medical care and may be life-threatening. PREVENTION  The best way to protect against getting a cold is to practice good hygiene. Avoid oral or hand contact with people with cold symptoms. Wash your hands often if contact occurs. There is no clear evidence that vitamin C, vitamin E, echinacea, or exercise reduces the chance of developing a cold. However, it is always recommended to get plenty of rest and practice good nutrition. TREATMENT  Treatment is directed at relieving symptoms. There is no cure. Antibiotics are not effective, because the infection is caused by a virus, not by bacteria. Treatment may include:  Increased fluid intake. Sports drinks offer valuable electrolytes, sugars, and fluids.  Breathing heated mist or steam (vaporizer or shower).  Eating chicken soup or other clear  broths, and maintaining good nutrition.  Getting plenty of rest.  Using gargles or lozenges for comfort.  Controlling fevers with ibuprofen or acetaminophen as directed by your caregiver.  Increasing usage of your inhaler if you have asthma. Zinc  gel and zinc lozenges, taken in the first 24 hours of the common cold, can shorten the duration and lessen the severity of symptoms. Pain medicines may help with fever, muscle aches, and throat pain. A variety of non-prescription medicines are available to treat congestion and runny nose. Your caregiver can make recommendations and may suggest nasal or lung inhalers for other symptoms.  HOME CARE INSTRUCTIONS   Only take over-the-counter or prescription medicines for pain, discomfort, or fever as directed by your caregiver.  Use a warm mist humidifier or inhale steam from a shower to increase air moisture. This may keep secretions moist and make it easier to breathe.  Drink enough water and fluids to keep your urine clear or pale yellow.  Rest as needed.  Return to work when your temperature has returned to normal or as your caregiver advises. You may need to stay home longer to avoid infecting others. You can also use a face mask and careful hand washing to prevent spread of the virus. SEEK MEDICAL CARE IF:   After the first few days, you feel you are getting worse rather than better.  You need your caregiver's advice about medicines to control symptoms.  You develop chills, worsening shortness of breath, or brown or red sputum. These may be signs of pneumonia.  You develop yellow or brown nasal discharge or pain in the face, especially when you bend forward. These may be signs of sinusitis.  You develop a fever, swollen neck glands, pain with swallowing, or white areas in the back of your throat. These may be signs of strep throat. SEEK IMMEDIATE MEDICAL CARE IF:   You have a fever.  You develop severe or persistent headache, ear pain, sinus pain, or chest pain.  You develop wheezing, a prolonged cough, cough up blood, or have a change in your usual mucus (if you have chronic lung disease).  You develop sore muscles or a stiff neck. Document Released: 04/23/2001 Document Revised:  01/20/2012 Document Reviewed: 03/01/2011 [facility] Patient Information 2014 [place], [place].

## 2013-10-12 NOTE — Progress Notes (Signed)
Subjective:    Patient ID: Paula [name], female    DOB: [birth year], 79 y.o.   MRN: [id number]  URI  The current episode started yesterday. The problem has been gradually improving. There has been no fever. Associated symptoms include coughing and nausea. Pertinent negatives include no abdominal pain, chest pain, diarrhea, dysuria, headaches, neck pain, rash, sinus pain, sneezing, sore throat or wheezing. She has tried increased fluids and sleep for the symptoms. The treatment provided mild relief.    Past Medical History  Diagnosis Date  . Depression   . Ejection fraction     EF 40%, echo, November, 2012  / in improved, EF 60%, echo, December, 2012  . Contrast media allergy     Patient feels poorly with contrast  . Status post AAA (abdominal aortic aneurysm) repair     Surgical repair, Dr. Hart Rochester, December 27, 2011  . Pre-syncope     vasovagal w/ heart block  . Parotid mass     has refused further eval 10/2011  . GERD (gastroesophageal reflux disease)   . Anxiety   . Facial tic     L sided spasms - Botox trial summer 2013, ?effective  . Macular degeneration   . AAA (abdominal aortic aneurysm)     s/p repair 12/2011  . Congestive heart failure     NL EF 10/2011 echo   . Diabetes mellitus type II, controlled   . Hypothyroidism   . Sinus bradycardia 09/19/2011    Occurring simultaneously with complete heart block   . Hypertension   . COPD (chronic obstructive pulmonary disease)   . Abdominal spasms     hemifacial-treated w/ botox xeomin -Dr Terrace Arabia    Review of Systems  Constitutional: Positive for fatigue. Negative for fever and unexpected weight change.  HENT: Negative for sneezing and sore throat.   Respiratory: Positive for cough. Negative for chest tightness, shortness of breath and wheezing.   Cardiovascular: Negative for chest pain and leg swelling.  Gastrointestinal: Positive for nausea. Negative for abdominal pain, diarrhea, constipation and abdominal distention.   Genitourinary: Negative for dysuria.  Musculoskeletal: Negative for neck pain.  Skin: Negative for rash.  Neurological: Negative for headaches.       Objective:   Physical Exam BP 120/72  Pulse 74  Temp(Src) 98.6 F (37 C) (Oral)  Wt 177 lb (80.287 kg)  SpO2 95% Wt Readings from Last 3 Encounters:  10/12/13 177 lb (80.287 kg)  10/06/13 180 lb (81.647 kg)  10/05/13 178 lb (80.74 kg)   Constitutional: She appears well-developed and well-nourished. nontoxic. No distress. spouse at side. HENT: NCAT, no sinus tenderness - OP with clear PND, no erythema or exudate Neck: Normal range of motion. Neck supple. No JVD present. No thyromegaly present.  Cardiovascular: Normal rate, regular rhythm and normal heart sounds.  No murmur heard. No BLE edema. Pulmonary/Chest: Effort normal and breath sounds normal. No respiratory distress. She has no wheezes.  Psychiatric: She has a normal mood and affect. Her behavior is normal. Judgment and thought content normal.   Lab Results  Component Value Date   WBC 12.3* 08/22/2013   HGB 11.7* 08/22/2013   HCT 35.2* 08/22/2013   PLT 296 08/22/2013   GLUCOSE 142* 08/22/2013   CHOL 149 02/10/2013   TRIG 218.0* 02/10/2013   HDL 30.10* 02/10/2013   LDLDIRECT 90.6 02/10/2013   LDLCALC 80 11/04/2011   ALT 24 08/22/2013   AST 25 08/22/2013   NA 140 08/22/2013   K 4.0 08/22/2013  CL 103 08/22/2013   CREATININE 0.88 08/22/2013   BUN 13 08/22/2013   CO2 26 08/22/2013   TSH 0.359 08/22/2013   INR 1.11 07/11/2013   HGBA1C 6.6* 07/11/2013       Assessment & Plan:   Cough - chronic No fever or hypoxia today - Reviewed CXR 08/23/2013 and CT chest 05/2013 No evidence for CHF/volume overload on exam Prior card and pulm evals again reviewed tx with pred taper, no role for antibiotics at this time Refill tessalon  Pt/spouse will call if symptoms worse or unimproved with rx'd care

## 2013-10-12 NOTE — Assessment & Plan Note (Signed)
  BP Readings from Last 3 Encounters:  10/12/13 120/72  10/06/13 131/68  10/05/13 120/68   The current medical regimen is effective;  continue present plan and medications.

## 2013-10-12 NOTE — Progress Notes (Signed)
Pre-visit discussion using our clinic review tool. No additional management support is needed unless otherwise documented below in the visit note.  

## 2013-10-13 ENCOUNTER — Emergency Department (HOSPITAL_COMMUNITY)
Admission: EM | Admit: 2013-10-13 | Discharge: 2013-10-13 | Disposition: A | Discharge status: Home / Self Care | Payer: Medicare Other | Attending: Emergency Medicine | Admitting: Emergency Medicine

## 2013-10-13 ENCOUNTER — Encounter (HOSPITAL_COMMUNITY): Payer: Self-pay | Admitting: Emergency Medicine

## 2013-10-13 ENCOUNTER — Ambulatory Visit: Payer: Medicare Other | Admitting: Psychology

## 2013-10-13 DIAGNOSIS — Z79899 Other long term (current) drug therapy: Secondary | ICD-10-CM | POA: Insufficient documentation

## 2013-10-13 DIAGNOSIS — K219 Gastro-esophageal reflux disease without esophagitis: Secondary | ICD-10-CM | POA: Insufficient documentation

## 2013-10-13 DIAGNOSIS — Z7982 Long term (current) use of aspirin: Secondary | ICD-10-CM | POA: Insufficient documentation

## 2013-10-13 DIAGNOSIS — J4489 Other specified chronic obstructive pulmonary disease: Secondary | ICD-10-CM | POA: Insufficient documentation

## 2013-10-13 DIAGNOSIS — R739 Hyperglycemia, unspecified: Secondary | ICD-10-CM

## 2013-10-13 DIAGNOSIS — I10 Essential (primary) hypertension: Secondary | ICD-10-CM | POA: Insufficient documentation

## 2013-10-13 DIAGNOSIS — IMO0002 Reserved for concepts with insufficient information to code with codable children: Secondary | ICD-10-CM | POA: Insufficient documentation

## 2013-10-13 DIAGNOSIS — Z950 Presence of cardiac pacemaker: Secondary | ICD-10-CM | POA: Insufficient documentation

## 2013-10-13 DIAGNOSIS — I509 Heart failure, unspecified: Secondary | ICD-10-CM | POA: Insufficient documentation

## 2013-10-13 DIAGNOSIS — Z9889 Other specified postprocedural states: Secondary | ICD-10-CM | POA: Insufficient documentation

## 2013-10-13 DIAGNOSIS — F411 Generalized anxiety disorder: Secondary | ICD-10-CM | POA: Insufficient documentation

## 2013-10-13 DIAGNOSIS — Z880 Allergy status to penicillin: Secondary | ICD-10-CM | POA: Insufficient documentation

## 2013-10-13 DIAGNOSIS — Z87891 Personal history of nicotine dependence: Secondary | ICD-10-CM | POA: Insufficient documentation

## 2013-10-13 DIAGNOSIS — F329 Major depressive disorder, single episode, unspecified: Secondary | ICD-10-CM | POA: Insufficient documentation

## 2013-10-13 DIAGNOSIS — E119 Type 2 diabetes mellitus without complications: Secondary | ICD-10-CM | POA: Insufficient documentation

## 2013-10-13 DIAGNOSIS — F3289 Other specified depressive episodes: Secondary | ICD-10-CM | POA: Insufficient documentation

## 2013-10-13 DIAGNOSIS — G518 Other disorders of facial nerve: Secondary | ICD-10-CM | POA: Insufficient documentation

## 2013-10-13 DIAGNOSIS — Z95818 Presence of other cardiac implants and grafts: Secondary | ICD-10-CM | POA: Insufficient documentation

## 2013-10-13 DIAGNOSIS — J449 Chronic obstructive pulmonary disease, unspecified: Secondary | ICD-10-CM | POA: Insufficient documentation

## 2013-10-13 DIAGNOSIS — G5139 Clonic hemifacial spasm, unspecified: Secondary | ICD-10-CM

## 2013-10-13 DIAGNOSIS — E039 Hypothyroidism, unspecified: Secondary | ICD-10-CM | POA: Insufficient documentation

## 2013-10-13 LAB — POCT I-STAT TROPONIN I: Troponin i, poc: 0 ng/mL (ref 0.00–0.08)

## 2013-10-13 LAB — CBC WITH DIFFERENTIAL/PLATELET
Basophils Absolute: 0 10*3/uL (ref 0.0–0.1)
Eosinophils Relative: 0 % (ref 0–5)
Lymphocytes Relative: 21 % (ref 12–46)
Monocytes Relative: 6 % (ref 3–12)
Neutro Abs: 11.3 10*3/uL — ABNORMAL HIGH (ref 1.7–7.7)
Neutrophils Relative %: 73 % (ref 43–77)
Platelets: 306 10*3/uL (ref 150–400)
RBC: 3.51 MIL/uL — ABNORMAL LOW (ref 3.87–5.11)
RDW: 14.8 % (ref 11.5–15.5)
WBC: 15.5 10*3/uL — ABNORMAL HIGH (ref 4.0–10.5)

## 2013-10-13 LAB — COMPREHENSIVE METABOLIC PANEL
ALT: 17 U/L (ref 0–35)
AST: 19 U/L (ref 0–37)
Albumin: 4.2 g/dL (ref 3.5–5.2)
CO2: 25 mEq/L (ref 19–32)
Calcium: 9.7 mg/dL (ref 8.4–10.5)
Creatinine, Ser: 0.98 mg/dL (ref 0.50–1.10)
GFR calc Af Amer: 60 mL/min — ABNORMAL LOW (ref >90)
GFR calc non Af Amer: 52 mL/min — ABNORMAL LOW (ref >90)
Glucose, Bld: 149 mg/dL — ABNORMAL HIGH (ref 70–99)
Sodium: 136 mEq/L (ref 135–145)
Total Protein: 6.8 g/dL (ref 6.0–8.3)

## 2013-10-13 LAB — LIPASE, BLOOD: Lipase: 55 U/L (ref 11–59)

## 2013-10-13 LAB — URINALYSIS, ROUTINE W REFLEX MICROSCOPIC
Hgb urine dipstick: NEGATIVE
Leukocytes, UA: NEGATIVE
Nitrite: NEGATIVE
Protein, ur: NEGATIVE mg/dL
Specific Gravity, Urine: 1.028 (ref 1.005–1.030)
Urobilinogen, UA: 1 mg/dL (ref 0.0–1.0)

## 2013-10-13 MED ORDER — ONDANSETRON HCL 4 MG/2ML IJ SOLN
4.0000 mg | Freq: Once | INTRAMUSCULAR | Status: AC
  Administered 2013-10-13: 4 mg via INTRAVENOUS
  Filled 2013-10-13: qty 2

## 2013-10-13 NOTE — ED Notes (Signed)
Waiting for blood draw

## 2013-10-13 NOTE — ED Notes (Signed)
Pt denies chest pain at this time.

## 2013-10-13 NOTE — ED Provider Notes (Signed)
CSN: [id number]     Arrival date & time 10/13/13  1448 History   First MD Initiated Contact with Patient 10/13/13 1724     Chief Complaint  Patient presents with  . Nausea   (Consider location/radiation/quality/duration/timing/severity/associated sxs/prior Treatment) HPI Comments: The patient is a 79 year-old female with a past medical history of COPD, HTN, AAA s/p repair 2012,  presenting the Emergency Department with a chief complaint of facial twitching and nausea. The patient was evaluated by her PCP yesterday and was diagnosed with an URI and was stared on medication.  She reports the facial twitching is a chronic issue and she is evaluated by a neurologist for it and was concerned because her baclofen did not complete resolve her twitching today.  She also receives botox injections for the twitching.  She reports her nausea started this morning but denies vomiting.     The history is provided by the patient and medical records. No language interpreter was used.    Past Medical History  Diagnosis Date  . Depression   . Ejection fraction     EF 40%, echo, November, 2012  / in improved, EF 60%, echo, December, 2012  . Contrast media allergy     Patient feels poorly with contrast  . Status post AAA (abdominal aortic aneurysm) repair     Surgical repair, Dr. Hart Rochester, December 27, 2011  . Pre-syncope     vasovagal w/ heart block  . Parotid mass     has refused further eval 10/2011  . GERD (gastroesophageal reflux disease)   . Anxiety   . Facial tic     L sided spasms - Botox trial summer 2013, ?effective  . Macular degeneration   . AAA (abdominal aortic aneurysm)     s/p repair 12/2011  . Congestive heart failure     NL EF 10/2011 echo   . Diabetes mellitus type II, controlled   . Hypothyroidism   . Sinus bradycardia 09/19/2011    Occurring simultaneously with complete heart block   . Hypertension   . COPD (chronic obstructive pulmonary disease)   . Abdominal spasms    hemifacial-treated w/ botox xeomin -Dr Terrace Arabia   Past Surgical History  Procedure Laterality Date  . Cholecystectomy    . Knee cartilage surgery      left  . Sinus surgery with instatrak    . Appendectomy    . Oophorectomy      ovarian cyst  . Cataract extraction      bilateral  . Bladder repair    . US echocardiography  10/14/11  . Abdominal aortic aneurysm repair  12/27/2011    Procedure: ANEURYSM ABDOMINAL AORTIC REPAIR;  Surgeon: Josephina Gip, MD;  Location: [facility] OR;  Service: Vascular;  Laterality: N/A;  Resection and Grafting Abdominal Aortic Aneurysm , Aorta Bi Iliac.  Marland Kitchen Pacemaker insertion  11/12  . Cardiac catheterization  11/04/11   Family History  Problem Relation Age of Onset  . Esophageal cancer Father   . Cancer Father   . Breast cancer Sister   . Cancer Sister   . Colon cancer Sister   . Heart disease Sister   . Heart disease Mother   . Heart disease Son    History  Substance Use Topics  . Smoking status: Former Smoker -- 1.00 packs/day for 30 years    Types: Cigarettes    Quit date: 08/31/2011  . Smokeless tobacco: Never Used  . Alcohol Use: No   OB History  Grav Para Term Preterm Abortions TAB SAB Ect Mult Living                 Review of Systems  Constitutional: Negative for fever and chills.  HENT: Positive for congestion and rhinorrhea.   Respiratory: Negative for cough and shortness of breath.   Gastrointestinal: Positive for nausea. Negative for vomiting, abdominal pain, diarrhea, constipation, blood in stool, abdominal distention and anal bleeding.  Genitourinary: Negative for dysuria, frequency and hematuria.    Allergies  Bactrim; Contrast media; Gadolinium derivatives; Iohexol; Penicillins; Sulfa antibiotics; Avelox; Ciprofloxacin; Delsym; and Hydromet  Home Medications   Current Outpatient Rx  Name  Route  Sig  Dispense  Refill  . acetaminophen (TYLENOL) 500 MG tablet   Oral   Take 500 mg by mouth every 6 (six) hours as needed (pain).          Marland Kitchen albuterol (PROVENTIL HFA;VENTOLIN HFA) 108 (90 BASE) MCG/ACT inhaler   Inhalation   Inhale 2 puffs into the lungs every 6 (six) hours as needed for wheezing.   1 Inhaler   2   . ALPRAZolam (XANAX) 1 MG tablet   Oral   Take 1 mg by mouth 3 (three) times daily as needed for anxiety.         Marland Kitchen aspirin EC 81 MG tablet   Oral   Take 81 mg by mouth every morning.          . baclofen (LIORESAL) 10 MG tablet      Take one-half tablet by  mouth at bedtime   45 each   1   . benzonatate (TESSALON) 200 MG capsule      TAKE 1 CAPSULE BY MOUTH 3 TIMES A DAY AS NEEDED FOR COUGH   60 capsule   0   . calcium carbonate (OS-CAL) 600 MG TABS   Oral   Take 600 mg by mouth every other day.          . cetirizine (ZYRTEC) 10 MG tablet   Oral   Take 10 mg by mouth daily as needed for allergies.         . Cholecalciferol (VITAMIN D3) 5000 UNITS TABS   Oral   Take 1 tablet by mouth every other day.          . furosemide (LASIX) 20 MG tablet   Oral   Take 1 tablet (20 mg total) by mouth daily.   30 tablet   0   . guaiFENesin (MUCINEX) 600 MG 12 hr tablet   Oral   Take 1 tablet (600 mg total) by mouth 2 (two) times daily.   60 tablet   5   . Ipratropium-Albuterol (COMBIVENT) 20-100 MCG/ACT AERS respimat   Inhalation   Inhale 1 puff into the lungs every 6 (six) hours as needed for wheezing or shortness of breath.   4 g   3   . levothyroxine (SYNTHROID, LEVOTHROID) 175 MCG tablet   Oral   Take 1 tablet (175 mcg total) by mouth daily.   30 tablet   0   . losartan-hydrochlorothiazide (HYZAAR) 50-12.5 MG per tablet   Oral   Take 1 tablet by mouth daily.          . metFORMIN (GLUCOPHAGE-XR) 500 MG 24 hr tablet   Oral   Take 1 tablet (500 mg total) by mouth daily with breakfast.   90 tablet   2   . mometasone (NASONEX) 50 MCG/ACT nasal spray   Nasal  Place 2 sprays into the nose daily.   17 g   12   . ondansetron (ZOFRAN) 4 MG tablet   Oral   Take  1 tablet (4 mg total) by mouth every 8 (eight) hours as needed for nausea.   20 tablet   1   . pantoprazole (PROTONIX) 40 MG tablet      Take 1 tablet by mouth  daily   90 tablet   1   . potassium chloride SA (K-DUR,KLOR-CON) 20 MEQ tablet   Oral   Take 1 tablet (20 mEq total) by mouth daily.   90 tablet   1   . predniSONE (STERAPRED UNI-PAK) 10 MG tablet   Oral   Take by mouth as directed. As directed x 6 days   21 tablet   0   . sertraline (ZOLOFT) 50 MG tablet   Oral   Take 1 tablet (50 mg total) by mouth daily.   90 tablet   1   . carvedilol (COREG) 6.25 MG tablet   Oral   Take 1 tablet (6.25 mg total) by mouth 2 (two) times daily with a meal.   180 tablet   3    BP 154/52  Pulse 52  Temp(Src) 98.4 F (36.9 C) (Oral)  Resp 20  SpO2 96% Physical Exam  Nursing note and vitals reviewed. Constitutional: She is oriented to person, place, and time. Vital signs are normal. She appears well-developed and well-nourished.  HENT:  Head: Normocephalic and atraumatic.  Nose: Nose normal.  Mouth/Throat: No oropharyngeal exudate.  Eyes: EOM are normal. Pupils are equal, round, and reactive to light. No scleral icterus.  Neck: Neck supple.  Cardiovascular: Normal rate and regular rhythm.   Pulmonary/Chest: Effort normal and breath sounds normal. She has no decreased breath sounds. She has no wheezes. She has no rhonchi.  Abdominal: Soft. Bowel sounds are normal. There is tenderness in the epigastric area. There is no rigidity, no guarding and no CVA tenderness.  Neurological: She is alert and oriented to person, place, and time. No cranial nerve deficit.  Right sided facial twitch noted.  No facial asymmetry.   Skin: Skin is warm and dry. No rash noted.  Psychiatric: She has a normal mood and affect.    ED Course  Procedures (including critical care time) Labs Review Labs Reviewed  COMPREHENSIVE METABOLIC PANEL - Abnormal; Notable for the following:    Glucose, Bld  149 (*)    BUN 26 (*)    GFR calc non Af Amer 52 (*)    GFR calc Af Amer 60 (*)    All other components within normal limits  CBC WITH DIFFERENTIAL - Abnormal; Notable for the following:    WBC 15.5 (*)    RBC 3.51 (*)    Hemoglobin 11.9 (*)    HCT 35.6 (*)    MCV 101.4 (*)    Neutro Abs 11.3 (*)    All other components within normal limits  LIPASE, BLOOD  URINALYSIS, ROUTINE W REFLEX MICROSCOPIC  CBC WITH DIFFERENTIAL  POCT I-STAT TROPONIN I   Imaging Review No results found.  EKG Interpretation    Date/Time:  Wednesday October 13 2013 18:28:36 EST Ventricular Rate:  61 PR Interval:  202 QRS Duration: 87 QT Interval:  474 QTC Calculation: 477 R Axis:   44 Text Interpretation:  Sinus rhythm Abnormal R-wave progression, early transition Nonspecific T abnormalities, lateral leads ED PHYSICIAN INTERPRETATION AVAILABLE IN [facility] Confirmed by TEST, RECORD ([id number]), editor  CLAYTON  CCT  CETT, ROBIN (2) on 10/15/2013 8:57:32 AM            MDM   1. Hyperglycemia   2. Facial spasm    Pt presents with several chronic complaints and recent nausea.  Exam with mild tenderness to palpation of epigastric.  Will evaluate for pancreatitis, EKG ordered, labs ordered.  Discussed patient history, condition, and labs with Dr. Jodi Mourning who agrees on the current evaluation of the patient in the ED and given the patient's history and age to screen for several things given vagueness of complaints. Discussed patient history, condition, and labs with Dr. Gardiner Rhyme who agrees the patient can be evaluated as an out-pt. Discussed lab results, imaging results, and treatment plan with the patient.  She reports understanding and no other concerns at this time.  Patient is stable for discharge at this time.     Clabe Seal, PA-C 10/16/13 [phone]

## 2013-10-13 NOTE — ED Notes (Signed)
Pt was seen by her PCP yesterday and was diagnosed with URI and started on meds. When pt went to eat today she got "really sick", Nauseated "but i dont ever vomit".  Pt has gaging sensation that she has intermittently and states sensation got really bad today. Pt also states she is hoarse today which she has a lot and wants to know if we can figure out what is going on with her. Pt states she has a lot of chronic problems and takes 19 meds and vitamins a day.

## 2013-10-14 ENCOUNTER — Other Ambulatory Visit: Payer: Self-pay | Admitting: *Deleted

## 2013-10-14 MED ORDER — CARVEDILOL 6.25 MG PO TABS: 6.2500 mg | ORAL_TABLET | Freq: Two times a day (BID) | ORAL | Status: DC

## 2013-10-16 NOTE — ED Provider Notes (Signed)
Medical screening examination/treatment/procedure(s) were conducted as a shared visit with non-physician practitioner(s) or resident  and myself.  I personally evaluated the patient during the encounter and agree with the findings and plan unless otherwise indicated.    I have personally reviewed any xrays and/ or EKG's with the provider and I agree with interpretation.   Multiple concerns from patient including facial twitching, recurrent epigastric pain without radiation, intermittent nausea.  Primary concern is left facial twitching which is followed outpt, botox hx.  Other sxs similar to previous.  Exam mild epig pain, well appearing, mild dry mm.  Mild AKI, pt tolerating po fluids.  Stressed close outpt fup and reasons to return.  RRR, CNs intact, mild dry mm, abdo soft/ mild epig tenderness.   Labs Reviewed  COMPREHENSIVE METABOLIC PANEL - Abnormal; Notable for the following:    Glucose, Bld 149 (*)    BUN 26 (*)    GFR calc non Af Amer 52 (*)    GFR calc Af Amer 60 (*)    All other components within normal limits  CBC WITH DIFFERENTIAL - Abnormal; Notable for the following:    WBC 15.5 (*)    RBC 3.51 (*)    Hemoglobin 11.9 (*)    HCT 35.6 (*)    MCV 101.4 (*)    Neutro Abs 11.3 (*)    All other components within normal limits  LIPASE, BLOOD  URINALYSIS, ROUTINE W REFLEX MICROSCOPIC  CBC WITH DIFFERENTIAL  POCT I-STAT TROPONIN I    Hyperglycemia, Nausea, Facial twitching   Enid Skeens, MD 10/16/13 [phone]

## 2013-10-19 ENCOUNTER — Other Ambulatory Visit: Payer: Self-pay

## 2013-10-19 DIAGNOSIS — Z1231 Encounter for screening mammogram for malignant neoplasm of breast: Secondary | ICD-10-CM

## 2013-10-20 ENCOUNTER — Other Ambulatory Visit: Payer: Self-pay | Admitting: *Deleted

## 2013-10-20 ENCOUNTER — Ambulatory Visit (INDEPENDENT_AMBULATORY_CARE_PROVIDER_SITE_OTHER): Payer: Medicare Other | Admitting: Internal Medicine

## 2013-10-20 ENCOUNTER — Encounter: Payer: Self-pay | Admitting: Adult Health

## 2013-10-20 ENCOUNTER — Ambulatory Visit (INDEPENDENT_AMBULATORY_CARE_PROVIDER_SITE_OTHER): Payer: Medicare Other | Admitting: Adult Health

## 2013-10-20 ENCOUNTER — Encounter: Payer: Self-pay | Admitting: Internal Medicine

## 2013-10-20 VITALS — BP 140/70 | HR 60 | Temp 97.6°F | Ht 65.0 in | Wt 173.6 lb

## 2013-10-20 VITALS — BP 120/72 | HR 88 | Temp 98.2°F | Wt 173.8 lb

## 2013-10-20 DIAGNOSIS — F411 Generalized anxiety disorder: Secondary | ICD-10-CM

## 2013-10-20 DIAGNOSIS — G514 Facial myokymia: Secondary | ICD-10-CM

## 2013-10-20 DIAGNOSIS — G518 Other disorders of facial nerve: Secondary | ICD-10-CM

## 2013-10-20 DIAGNOSIS — R5381 Other malaise: Secondary | ICD-10-CM

## 2013-10-20 DIAGNOSIS — J439 Emphysema, unspecified: Secondary | ICD-10-CM

## 2013-10-20 DIAGNOSIS — J438 Other emphysema: Secondary | ICD-10-CM

## 2013-10-20 DIAGNOSIS — R5383 Other fatigue: Secondary | ICD-10-CM

## 2013-10-20 DIAGNOSIS — I5032 Chronic diastolic (congestive) heart failure: Secondary | ICD-10-CM

## 2013-10-20 DIAGNOSIS — F419 Anxiety disorder, unspecified: Secondary | ICD-10-CM

## 2013-10-20 DIAGNOSIS — I509 Heart failure, unspecified: Secondary | ICD-10-CM

## 2013-10-20 MED ORDER — IPRATROPIUM-ALBUTEROL 20-100 MCG/ACT IN AERS: 1.0000 | INHALATION_SPRAY | Freq: Four times a day (QID) | RESPIRATORY_TRACT | Status: DC | PRN

## 2013-10-20 MED ORDER — BACLOFEN 10 MG PO TABS: ORAL_TABLET | ORAL | Status: DC

## 2013-10-20 MED ORDER — MOMETASONE FUROATE 50 MCG/ACT NA SUSP: 2.0000 | Freq: Every day | NASAL | Status: DC

## 2013-10-20 NOTE — Patient Instructions (Signed)
It was good to see you today.  We have reviewed your prior records including labs and tests today  Increase baclofen to 1 whole tab 2 or 3 times per day for face spasm  Your prescription(s) have been submitted to your pharmacy. Please take as directed and contact our office if you believe you are having problem(s) with the medication(s).  Other Medications reviewed and updated, no other changes recommended at this time.

## 2013-10-20 NOTE — Assessment & Plan Note (Signed)
eval and tx ongoing by neuro - but pt ? benefit of botox injections q67mo -  follow up for repeat injection as needed (neuro feels psychogenic) Continue baclofen as ongoing - increase dose today

## 2013-10-20 NOTE — Progress Notes (Signed)
Pre-visit discussion using our clinic review tool. No additional management support is needed unless otherwise documented below in the visit note.  

## 2013-10-20 NOTE — Patient Instructions (Signed)
OK to use combivent as needed Stay on fluid pill follow up Dr. Vassie Loll  In 3 months  Follow up with Dr. Felicity Coyer as planned if nausea does not resolve, may be from taking meds on empty stomach.  Please contact office for sooner follow up if symptoms do not improve or worsen or seek emergency care

## 2013-10-20 NOTE — Progress Notes (Signed)
Subjective:    Patient ID: Paula [name], female    DOB: Jan 27, 1927, 79 y.o.   MRN: [id number]  HPI  Here for ER follow up - increase face spasm - due for botox next month - ?increase baclofen  Past Medical History  Diagnosis Date  . Depression   . Ejection fraction     EF 40%, echo, November, 2012  / in improved, EF 60%, echo, December, 2012  . Contrast media allergy     Patient feels poorly with contrast  . Status post AAA (abdominal aortic aneurysm) repair     Surgical repair, Dr. Hart Rochester, December 27, 2011  . Pre-syncope     vasovagal w/ heart block  . Parotid mass     has refused further eval 10/2011  . GERD (gastroesophageal reflux disease)   . Anxiety   . Facial tic     L sided spasms - Botox trial summer 2013, ?effective  . Macular degeneration   . AAA (abdominal aortic aneurysm)     s/p repair 12/2011  . Congestive heart failure     NL EF 10/2011 echo   . Diabetes mellitus type II, controlled   . Hypothyroidism   . Sinus bradycardia 09/19/2011    Occurring simultaneously with complete heart block   . Hypertension   . COPD (chronic obstructive pulmonary disease)   . Abdominal spasms     hemifacial-treated w/ botox xeomin -Dr Terrace Arabia    Review of Systems  Constitutional: Positive for fatigue. Negative for fever.  HENT: Negative for postnasal drip, rhinorrhea and sore throat.   Respiratory: Positive for cough. Negative for shortness of breath and wheezing.   Cardiovascular: Negative for chest pain.  Gastrointestinal: Negative for heartburn.  Musculoskeletal: Positive for myalgias (chronic).  Skin: Negative for rash.  Allergic/Immunologic: Positive for environmental allergies.  Neurological: Negative for headaches.       Objective:   Physical Exam BP 120/72  Pulse 88  Temp(Src) 98.2 F (36.8 C) (Oral)  Wt 173 lb 12.8 oz (78.835 kg)  SpO2 97% Wt Readings from Last 3 Encounters:  10/20/13 173 lb 12.8 oz (78.835 kg)  10/20/13 173 lb 9.6 oz (78.744 kg)   10/12/13 177 lb (80.287 kg)   Constitutional: She appears well-developed and well-nourished. nontoxic. No distress. spouse at side. Neck: Normal range of motion. Neck supple. No JVD present. No thyromegaly present.  Cardiovascular: Normal rate, regular rhythm and normal heart sounds.  No murmur heard. No BLE edema. Pulmonary/Chest: Effort normal and breath sounds normal. No respiratory distress. She has no wheezes.  Psychiatric: She has a normal mood and affect. Her behavior is normal. Judgment and thought content normal.   Lab Results  Component Value Date   WBC 15.5* 10/13/2013   HGB 11.9* 10/13/2013   HCT 35.6* 10/13/2013   PLT 306 10/13/2013   GLUCOSE 149* 10/13/2013   CHOL 149 02/10/2013   TRIG 218.0* 02/10/2013   HDL 30.10* 02/10/2013   LDLDIRECT 90.6 02/10/2013   LDLCALC 80 11/04/2011   ALT 17 10/13/2013   AST 19 10/13/2013   NA 136 10/13/2013   K 4.1 10/13/2013   CL 98 10/13/2013   CREATININE 0.98 10/13/2013   BUN 26* 10/13/2013   CO2 25 10/13/2013   TSH 0.359 08/22/2013   INR 1.11 07/11/2013   HGBA1C 6.6* 07/11/2013       Assessment & Plan:   See problem list. Medications and labs reviewed today.  Fatigue - chronic and nonspecific symptoms/exam - reviewed screening labs -  support offered

## 2013-10-20 NOTE — Assessment & Plan Note (Signed)
Appears compensated without flare. Continue on current regimen 

## 2013-10-20 NOTE — Assessment & Plan Note (Signed)
Minimum COPD, with restriction, mostly on PFTs. Recent mild flare. Treated with steroids with improvement. Patient may use Combivent as needed. May need to add a maintenance inhaler if frequent exacerbations.

## 2013-10-20 NOTE — Progress Notes (Signed)
Subjective:    Patient ID: Paula [name], female    DOB: 07-Nov-1927, 79 y.o.   MRN: [id number]  HPI 79 year old ex smoker ( quit 10/12) for FU of COPD  She smoked for >50 yrs , quit 2012 .  She had Recurrent PNA - 2 episodes during 2013  She underwent urgent surgery for abdominal aortic aneurysm by Dr. Hart Rochester in 12/2011. She ambulates with a walker.  CT scan of the chest 12/13 showed Loculated left pleural effusion with suspected rounded atelectasis in the left lower lobe. Evidence of prior granulomatous disease with calcified left lower lobe granuloma, calcified left hilar lymph nodes and calcified granulomas in the liver and spleen.  CT abdomen clarified a cystic mass in the abdomen, likely a pancreatic pseudocyst.  Review of her imaging study shows left lower lobe airspace disease dating back to 10/13.  Of note, she is allergic to contrast media and had a reaction with neck swelling requiring steroids.  ENT evaluation by Dr. Pollyann Kennedy for hoarseness was nondiagnostic   5 ER visits in 2014 for what seems to be hemifacial spasms -  neurology ([facility]meier)  PFT showed mild airway obstruction- fev1 70%, ratio 73 -no BD response, TLC 70% c/w midl restriction   05/19/13 -- CT chest - LLL nodule is stable, Pneumonia like markings in the right upper and right lower lobes  08/03/13  Tx for RUL/RLL PNA 05/2013  w/ Omnicef x 7 days (has multiple abx intolerances) .  FU CXR  showed no acute infiltrate- chronic changes.  Adm 8/31- 07/13/13 for CHF, reviewed CXR 'why do they have me on so many meds?' Ambulates with walker - in rehab Wt has increased -few lbs, compliant with lasix Confused - should I take albuterol or combivent? No discolored mucus. Still weak with little energy.  >>no changes   10/20/2013 Follow up  Returns for a 3 mth follow up for COPD.  Previous PFT showed mild airway obstruction- fev1 70%, ratio 73 -no BD response, TLC 70% c/w midl restriction  Says her breathing is doing better. Was  seen by PCP several times over last month w/ multiple somatic complaints. Mild AR /URI flare , tx w/ abx and steroids. Says Cough and nasal symptoms have improved. Last chest x-ray in October showed chronic changes, without any acute process. Patient says that she has chronic nausea. After taking her medications. This morning, and became sick on her stomach. She denies any vomiting, diarrhea, constipation, bloody stools, or fever. Patient has an appointment later today with her family doctor. Patient denies any hemoptysis, orthopnea, PND, leg swelling. She uses Combivent on an as-needed basis. Reports that she uses this once or twice a week. Fiinished pred taper yesterday  Review of Systems  neg for any significant sore throat, dysphagia, itching, sneezing, nasal congestion or excess/ purulent secretions, fever, chills, sweats, unintended wt loss, pleuritic or exertional cp, hempoptysis, orthopnea pnd or change in chronic leg swelling. Also denies presyncope, palpitations, heartburn, abdominal pain,  vomiting, diarrhea or change in bowel or urinary habits, dysuria,hematuria, rash, arthralgias, visual complaints, headache, numbness weakness or ataxia.     Objective:   Physical Exam   Gen. Pleasant, obese, in no distress, elderly ENT - no lesions, no post nasal drip Neck: No JVD, no thyromegaly, no carotid bruits Lungs: no use of accessory muscles, no dullness to percussion, decreased without rales or rhonchi  Cardiovascular: Rhythm regular, heart sounds  normal, no murmurs or gallops, no peripheral edema Musculoskeletal: No deformities, no cyanosis or  clubbing , no tremors ABD : obese, soft NT , No hepatosplenomegaly.       Assessment & Plan:

## 2013-10-20 NOTE — Assessment & Plan Note (Signed)
Significant chronic anxiety (spouse and pt), likely contributing to chronic "unwell" symptoms Review and reassurance provided to patient and spouse today Continue same sertraline  Last increased BZ to TID 02/2013 S/p counseling with gutterman 02/2013 x 1 OV, to follow up prn  

## 2013-10-22 ENCOUNTER — Telehealth: Payer: Self-pay | Admitting: *Deleted

## 2013-10-22 MED ORDER — BACLOFEN 10 MG PO TABS: ORAL_TABLET | ORAL | Status: DC

## 2013-10-22 NOTE — Telephone Encounter (Signed)
Resent baclofen to [facility] for [facility]270 for 90 day supply...lmb

## 2013-10-22 NOTE — Telephone Encounter (Signed)
Pt husband called states pts Rx of Baclofen was to be sent to [facility] and it was to be #270 instead of #90.  He requests this Rx be expedited to [facility].  Please advise

## 2013-10-24 ENCOUNTER — Other Ambulatory Visit: Payer: Self-pay | Admitting: Internal Medicine

## 2013-10-25 ENCOUNTER — Encounter: Payer: Self-pay | Admitting: Internal Medicine

## 2013-10-25 ENCOUNTER — Other Ambulatory Visit: Payer: Self-pay | Admitting: Internal Medicine

## 2013-10-25 ENCOUNTER — Ambulatory Visit (INDEPENDENT_AMBULATORY_CARE_PROVIDER_SITE_OTHER): Payer: Medicare Other | Admitting: Internal Medicine

## 2013-10-25 VITALS — BP 120/82 | HR 92 | Temp 99.1°F | Wt 170.8 lb

## 2013-10-25 DIAGNOSIS — Z8679 Personal history of other diseases of the circulatory system: Secondary | ICD-10-CM

## 2013-10-25 DIAGNOSIS — Z9889 Other specified postprocedural states: Secondary | ICD-10-CM

## 2013-10-25 DIAGNOSIS — R110 Nausea: Secondary | ICD-10-CM

## 2013-10-25 DIAGNOSIS — R935 Abnormal findings on diagnostic imaging of other abdominal regions, including retroperitoneum: Secondary | ICD-10-CM

## 2013-10-25 NOTE — Progress Notes (Signed)
Pre-visit discussion using our clinic review tool. No additional management support is needed unless otherwise documented below in the visit note.  

## 2013-10-25 NOTE — Progress Notes (Signed)
Subjective:    Patient ID: Paula [name], female    DOB: 1927/02/20, 79 y.o.   MRN: [id number]  HPI  Here for nausea, "always sick"   Past Medical History  Diagnosis Date  . Depression   . Ejection fraction     EF 40%, echo, November, 2012  / in improved, EF 60%, echo, December, 2012  . Contrast media allergy     Patient feels poorly with contrast  . Status post AAA (abdominal aortic aneurysm) repair     Surgical repair, Dr. Hart Rochester, December 27, 2011  . Pre-syncope     vasovagal w/ heart block  . Parotid mass     has refused further eval 10/2011  . GERD (gastroesophageal reflux disease)   . Anxiety   . Facial tic     L sided spasms - Botox trial summer 2013, ?effective  . Macular degeneration   . AAA (abdominal aortic aneurysm)     s/p repair 12/2011  . Congestive heart failure     NL EF 10/2011 echo   . Diabetes mellitus type II, controlled   . Hypothyroidism   . Sinus bradycardia 09/19/2011    Occurring simultaneously with complete heart block   . Hypertension   . COPD (chronic obstructive pulmonary disease)   . Abdominal spasms     hemifacial-treated w/ botox xeomin -Dr Terrace Arabia    Review of Systems  Constitutional: Positive for fatigue. Negative for fever.  HENT: Negative for postnasal drip, rhinorrhea and sore throat.   Respiratory: Positive for cough. Negative for shortness of breath and wheezing.   Cardiovascular: Negative for chest pain.  Gastrointestinal: Positive for nausea (chronic). Negative for heartburn and abdominal pain.  Musculoskeletal: Positive for myalgias (chronic).  Skin: Negative for rash.  Neurological: Negative for headaches.       Objective:   Physical Exam BP 120/82  Pulse 92  Temp(Src) 99.1 F (37.3 C) (Oral)  Wt 170 lb 12.8 oz (77.474 kg)  SpO2 90% Wt Readings from Last 3 Encounters:  10/25/13 170 lb 12.8 oz (77.474 kg)  10/20/13 173 lb 12.8 oz (78.835 kg)  10/20/13 173 lb 9.6 oz (78.744 kg)   Constitutional: She appears  well-developed and well-nourished. nontoxic. No distress. spouse at side. Neck: Normal range of motion. Neck supple. No JVD present. No thyromegaly present.  Cardiovascular: Normal rate, regular rhythm and normal heart sounds.  No murmur heard. No BLE edema. Pulmonary/Chest: Effort normal and breath sounds normal. No respiratory distress. She has no wheezes.  Abdomen: mild distension, but SNTND, +BS - no appreciable mass or ascites Psychiatric: She has a normal mood and affect. Her behavior is normal. Judgment and thought content normal.   Lab Results  Component Value Date   WBC 15.5* 10/13/2013   HGB 11.9* 10/13/2013   HCT 35.6* 10/13/2013   PLT 306 10/13/2013   GLUCOSE 149* 10/13/2013   CHOL 149 02/10/2013   TRIG 218.0* 02/10/2013   HDL 30.10* 02/10/2013   LDLDIRECT 90.6 02/10/2013   LDLCALC 80 11/04/2011   ALT 17 10/13/2013   AST 19 10/13/2013   NA 136 10/13/2013   K 4.1 10/13/2013   CL 98 10/13/2013   CREATININE 0.98 10/13/2013   BUN 26* 10/13/2013   CO2 25 10/13/2013   TSH 0.359 08/22/2013   INR 1.11 07/11/2013   HGBA1C 6.6* 07/11/2013   Lab Results  Component Value Date   LIPASE 55 10/13/2013   CT a/p w/o CM 03/04/13: IMPRESSION:  1. No urinary tract calculi or  hydronephrosis.  2. Mild hepatic steatosis. Hepatic morphology which is suspicious  for mild cirrhosis. Correlate with risk factors.  3. Similar left lung base presume rounded atelectasis and adjacent  pleural thickening/fluid.  4. Slight decrease in size of a lesion extending off the caudal  aspect of the pancreatic body. Favored to represent a pseudocyst.  5. Status post aortic aneurysm repair. 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  6. Subtle omental/peritoneal nodularity. Similar to on the prior  exam, but new since 2012. If there is no prior malignancy history,  this may relate to adjacent remote diverticulosis or  diverticulitis. If there is  any such history of primary  malignancy, follow-up CT at 3 months should be considered to exclude metastasis.  Original Report Authenticated By: Jeronimo Greaves, M.D.     Assessment & Plan:   nausea - chronic and nonspecific symptoms/exam - reviewed recent labs -   order CT a/p to follow up anatomy as previously suggested - pt refuses contrast even premedicated - so proceed nonCM  continue zofran prn

## 2013-10-25 NOTE — Patient Instructions (Addendum)
It was good to see you today.  We have reviewed your prior records including labs and tests today  we'll make referral for CT scan of abdomen to evaluate nausea . Our office will contact you regarding appointment(s) once made.  Medications reviewed and updated, no changes recommended at this time.  Nausea, Adult Nausea means you feel sick to your stomach or need to throw up (vomit). It may be a sign of a more serious problem. If nausea gets worse, you may throw up. If you throw up a lot, you may lose too much body fluid (dehydration). HOME CARE   Get plenty of rest.  Ask your doctor how to replace body fluid losses (rehydrate).  Eat small amounts of food. Sip liquids more often.  Take all medicines as told by your doctor. GET HELP RIGHT AWAY IF:  You have a fever.  You pass out (faint).  You keep throwing up or have blood in your throw up.  You are very weak, have dry lips or a dry mouth, or you are very thirsty (dehydrated).  You have dark or bloody poop (stool).  You have very bad chest or belly (abdominal) pain.  You do not get better after 2 days, or you get worse.  You have a headache. MAKE SURE YOU:  Understand these instructions.  Will watch your condition.  Will get help right away if you are not doing well or get worse. Document Released: 10/17/2011 Document Revised: 01/20/2012 Document Reviewed: 10/17/2011 [facility] Patient Information 2014 [place], [place].

## 2013-10-25 NOTE — Assessment & Plan Note (Signed)
Surgical repair Hart Rochester) 12/27/11 follow up CT now

## 2013-10-26 ENCOUNTER — Ambulatory Visit (INDEPENDENT_AMBULATORY_CARE_PROVIDER_SITE_OTHER)
Admission: RE | Admit: 2013-10-26 | Discharge: 2013-10-26 | Disposition: A | Discharge status: Home / Self Care | Payer: Medicare Other | Source: Ambulatory Visit | Attending: Internal Medicine | Admitting: Internal Medicine

## 2013-10-26 DIAGNOSIS — R935 Abnormal findings on diagnostic imaging of other abdominal regions, including retroperitoneum: Secondary | ICD-10-CM

## 2013-10-26 DIAGNOSIS — R110 Nausea: Secondary | ICD-10-CM

## 2013-10-27 ENCOUNTER — Other Ambulatory Visit: Payer: Self-pay | Admitting: *Deleted

## 2013-10-27 MED ORDER — ALBUTEROL SULFATE HFA 108 (90 BASE) MCG/ACT IN AERS: 2.0000 | INHALATION_SPRAY | Freq: Four times a day (QID) | RESPIRATORY_TRACT | Status: DC | PRN

## 2013-10-28 ENCOUNTER — Telehealth: Payer: Self-pay | Admitting: *Deleted

## 2013-10-28 NOTE — Telephone Encounter (Signed)
Spoke with pt advised of MDs result note. 

## 2013-11-02 ENCOUNTER — Ambulatory Visit (INDEPENDENT_AMBULATORY_CARE_PROVIDER_SITE_OTHER): Payer: Medicare Other | Admitting: Psychology

## 2013-11-02 DIAGNOSIS — F4320 Adjustment disorder, unspecified: Secondary | ICD-10-CM

## 2013-11-06 ENCOUNTER — Inpatient Hospital Stay (HOSPITAL_COMMUNITY)
Admission: EM | Admit: 2013-11-06 | Discharge: 2013-11-08 | DRG: 069 | Disposition: A | Discharge status: Home / Self Care | Payer: Medicare Other | Attending: Internal Medicine | Admitting: Internal Medicine

## 2013-11-06 ENCOUNTER — Emergency Department (HOSPITAL_COMMUNITY): Payer: Medicare Other

## 2013-11-06 ENCOUNTER — Inpatient Hospital Stay (HOSPITAL_COMMUNITY): Payer: Medicare Other

## 2013-11-06 ENCOUNTER — Encounter (HOSPITAL_COMMUNITY): Payer: Self-pay | Admitting: Emergency Medicine

## 2013-11-06 DIAGNOSIS — I509 Heart failure, unspecified: Secondary | ICD-10-CM | POA: Diagnosis present

## 2013-11-06 DIAGNOSIS — E119 Type 2 diabetes mellitus without complications: Secondary | ICD-10-CM | POA: Diagnosis present

## 2013-11-06 DIAGNOSIS — R9430 Abnormal result of cardiovascular function study, unspecified: Secondary | ICD-10-CM

## 2013-11-06 DIAGNOSIS — J438 Other emphysema: Secondary | ICD-10-CM | POA: Diagnosis present

## 2013-11-06 DIAGNOSIS — E1151 Type 2 diabetes mellitus with diabetic peripheral angiopathy without gangrene: Secondary | ICD-10-CM | POA: Diagnosis present

## 2013-11-06 DIAGNOSIS — J439 Emphysema, unspecified: Secondary | ICD-10-CM | POA: Diagnosis present

## 2013-11-06 DIAGNOSIS — E039 Hypothyroidism, unspecified: Secondary | ICD-10-CM | POA: Diagnosis present

## 2013-11-06 DIAGNOSIS — F329 Major depressive disorder, single episode, unspecified: Secondary | ICD-10-CM

## 2013-11-06 DIAGNOSIS — J441 Chronic obstructive pulmonary disease with (acute) exacerbation: Secondary | ICD-10-CM

## 2013-11-06 DIAGNOSIS — F3289 Other specified depressive episodes: Secondary | ICD-10-CM | POA: Diagnosis present

## 2013-11-06 DIAGNOSIS — Z7982 Long term (current) use of aspirin: Secondary | ICD-10-CM

## 2013-11-06 DIAGNOSIS — Z66 Do not resuscitate: Secondary | ICD-10-CM | POA: Diagnosis present

## 2013-11-06 DIAGNOSIS — H3530 Unspecified macular degeneration: Secondary | ICD-10-CM | POA: Diagnosis present

## 2013-11-06 DIAGNOSIS — R1310 Dysphagia, unspecified: Secondary | ICD-10-CM | POA: Diagnosis present

## 2013-11-06 DIAGNOSIS — Z8679 Personal history of other diseases of the circulatory system: Secondary | ICD-10-CM

## 2013-11-06 DIAGNOSIS — I714 Abdominal aortic aneurysm, without rupture: Secondary | ICD-10-CM

## 2013-11-06 DIAGNOSIS — M545 Low back pain: Secondary | ICD-10-CM

## 2013-11-06 DIAGNOSIS — J189 Pneumonia, unspecified organism: Secondary | ICD-10-CM

## 2013-11-06 DIAGNOSIS — F32A Depression, unspecified: Secondary | ICD-10-CM | POA: Diagnosis present

## 2013-11-06 DIAGNOSIS — Z950 Presence of cardiac pacemaker: Secondary | ICD-10-CM

## 2013-11-06 DIAGNOSIS — G5139 Clonic hemifacial spasm, unspecified: Secondary | ICD-10-CM

## 2013-11-06 DIAGNOSIS — I5032 Chronic diastolic (congestive) heart failure: Secondary | ICD-10-CM

## 2013-11-06 DIAGNOSIS — Z91041 Radiographic dye allergy status: Secondary | ICD-10-CM

## 2013-11-06 DIAGNOSIS — R001 Bradycardia, unspecified: Secondary | ICD-10-CM

## 2013-11-06 DIAGNOSIS — F419 Anxiety disorder, unspecified: Secondary | ICD-10-CM

## 2013-11-06 DIAGNOSIS — K219 Gastro-esophageal reflux disease without esophagitis: Secondary | ICD-10-CM | POA: Diagnosis present

## 2013-11-06 DIAGNOSIS — F0390 Unspecified dementia without behavioral disturbance: Secondary | ICD-10-CM | POA: Diagnosis present

## 2013-11-06 DIAGNOSIS — K118 Other diseases of salivary glands: Secondary | ICD-10-CM

## 2013-11-06 DIAGNOSIS — J309 Allergic rhinitis, unspecified: Secondary | ICD-10-CM

## 2013-11-06 DIAGNOSIS — R55 Syncope and collapse: Secondary | ICD-10-CM

## 2013-11-06 DIAGNOSIS — Z8673 Personal history of transient ischemic attack (TIA), and cerebral infarction without residual deficits: Secondary | ICD-10-CM

## 2013-11-06 DIAGNOSIS — G514 Facial myokymia: Secondary | ICD-10-CM

## 2013-11-06 DIAGNOSIS — F411 Generalized anxiety disorder: Secondary | ICD-10-CM | POA: Diagnosis present

## 2013-11-06 DIAGNOSIS — I10 Essential (primary) hypertension: Secondary | ICD-10-CM | POA: Diagnosis present

## 2013-11-06 DIAGNOSIS — G459 Transient cerebral ischemic attack, unspecified: Principal | ICD-10-CM | POA: Diagnosis present

## 2013-11-06 DIAGNOSIS — Z87891 Personal history of nicotine dependence: Secondary | ICD-10-CM

## 2013-11-06 DIAGNOSIS — R51 Headache: Secondary | ICD-10-CM

## 2013-11-06 DIAGNOSIS — R4701 Aphasia: Secondary | ICD-10-CM | POA: Diagnosis present

## 2013-11-06 LAB — URINALYSIS, ROUTINE W REFLEX MICROSCOPIC
Bilirubin Urine: NEGATIVE
Hgb urine dipstick: NEGATIVE
Specific Gravity, Urine: 1.024 (ref 1.005–1.030)
Urobilinogen, UA: 1 mg/dL (ref 0.0–1.0)
pH: 6 (ref 5.0–8.0)

## 2013-11-06 LAB — CBC WITH DIFFERENTIAL/PLATELET
Basophils Absolute: 0.1 10*3/uL (ref 0.0–0.1)
Eosinophils Absolute: 0.4 10*3/uL (ref 0.0–0.7)
Eosinophils Relative: 3 % (ref 0–5)
HCT: 31.7 % — ABNORMAL LOW (ref 36.0–46.0)
Hemoglobin: 10.3 g/dL — ABNORMAL LOW (ref 12.0–15.0)
Lymphocytes Relative: 19 % (ref 12–46)
Lymphs Abs: 2.1 10*3/uL (ref 0.7–4.0)
MCV: 103.9 fL — ABNORMAL HIGH (ref 78.0–100.0)
Monocytes Absolute: 0.6 10*3/uL (ref 0.1–1.0)
Neutro Abs: 7.6 10*3/uL (ref 1.7–7.7)
Neutrophils Relative %: 71 % (ref 43–77)
Platelets: 248 10*3/uL (ref 150–400)
RBC: 3.05 MIL/uL — ABNORMAL LOW (ref 3.87–5.11)
RDW: 14.8 % (ref 11.5–15.5)
WBC: 10.8 10*3/uL — ABNORMAL HIGH (ref 4.0–10.5)

## 2013-11-06 LAB — RAPID URINE DRUG SCREEN, HOSP PERFORMED
Amphetamines: NOT DETECTED
Benzodiazepines: POSITIVE — AB
Cocaine: NOT DETECTED
Opiates: NOT DETECTED
Tetrahydrocannabinol: NOT DETECTED

## 2013-11-06 LAB — PROTIME-INR: Prothrombin Time: 13.3 seconds (ref 11.6–15.2)

## 2013-11-06 LAB — POCT I-STAT TROPONIN I: Troponin i, poc: 0 ng/mL (ref 0.00–0.08)

## 2013-11-06 LAB — COMPREHENSIVE METABOLIC PANEL
ALT: 16 U/L (ref 0–35)
AST: 17 U/L (ref 0–37)
Albumin: 3.5 g/dL (ref 3.5–5.2)
Alkaline Phosphatase: 56 U/L (ref 39–117)
CO2: 27 mEq/L (ref 19–32)
Calcium: 9.5 mg/dL (ref 8.4–10.5)
Creatinine, Ser: 0.96 mg/dL (ref 0.50–1.10)
GFR calc non Af Amer: 53 mL/min — ABNORMAL LOW (ref >90)
Potassium: 3.8 mEq/L (ref 3.5–5.1)
Sodium: 140 mEq/L (ref 135–145)

## 2013-11-06 LAB — POCT I-STAT, CHEM 8
BUN: 14 mg/dL (ref 6–23)
Calcium, Ion: 1.18 mmol/L (ref 1.13–1.30)
Creatinine, Ser: 1.1 mg/dL (ref 0.50–1.10)
Glucose, Bld: 133 mg/dL — ABNORMAL HIGH (ref 70–99)
TCO2: 28 mmol/L (ref 0–100)

## 2013-11-06 LAB — ETHANOL: Alcohol, Ethyl (B): <11 mg/dL (ref 0–11)

## 2013-11-06 LAB — GLUCOSE, CAPILLARY

## 2013-11-06 MED ORDER — PANTOPRAZOLE SODIUM 40 MG PO TBEC
40.0000 mg | DELAYED_RELEASE_TABLET | Freq: Every day | ORAL | Status: DC
  Administered 2013-11-06 – 2013-11-08 (×3): 40 mg via ORAL
  Filled 2013-11-06 (×2): qty 1

## 2013-11-06 MED ORDER — IPRATROPIUM-ALBUTEROL 20-100 MCG/ACT IN AERS
1.0000 | INHALATION_SPRAY | Freq: Four times a day (QID) | RESPIRATORY_TRACT | Status: DC | PRN
Start: 2013-11-06 — End: 2013-11-08
  Filled 2013-11-06: qty 4

## 2013-11-06 MED ORDER — SERTRALINE HCL 50 MG PO TABS
50.0000 mg | ORAL_TABLET | Freq: Every day | ORAL | Status: DC
  Administered 2013-11-06 – 2013-11-08 (×3): 50 mg via ORAL
  Filled 2013-11-06 (×3): qty 1

## 2013-11-06 MED ORDER — LORAZEPAM 2 MG/ML IJ SOLN
0.5000 mg | Freq: Once | INTRAMUSCULAR | Status: AC
  Administered 2013-11-06: 0.5 mg via INTRAVENOUS
  Filled 2013-11-06: qty 1

## 2013-11-06 MED ORDER — LORATADINE 10 MG PO TABS
10.0000 mg | ORAL_TABLET | Freq: Every day | ORAL | Status: DC
  Administered 2013-11-08: 10 mg via ORAL
  Filled 2013-11-06 (×3): qty 1

## 2013-11-06 MED ORDER — ASPIRIN EC 325 MG PO TBEC
325.0000 mg | DELAYED_RELEASE_TABLET | Freq: Every day | ORAL | Status: DC
  Administered 2013-11-06 – 2013-11-08 (×3): 325 mg via ORAL
  Filled 2013-11-06 (×3): qty 1

## 2013-11-06 MED ORDER — LOSARTAN POTASSIUM 50 MG PO TABS
50.0000 mg | ORAL_TABLET | Freq: Every day | ORAL | Status: DC
  Administered 2013-11-07 – 2013-11-08 (×2): 50 mg via ORAL
  Filled 2013-11-06 (×2): qty 1

## 2013-11-06 MED ORDER — CARVEDILOL 6.25 MG PO TABS
6.2500 mg | ORAL_TABLET | Freq: Two times a day (BID) | ORAL | Status: DC
  Administered 2013-11-06 – 2013-11-08 (×4): 6.25 mg via ORAL
  Filled 2013-11-06 (×6): qty 1

## 2013-11-06 MED ORDER — FUROSEMIDE 20 MG PO TABS
20.0000 mg | ORAL_TABLET | Freq: Every day | ORAL | Status: DC
  Administered 2013-11-08: 20 mg via ORAL
  Filled 2013-11-06 (×2): qty 1

## 2013-11-06 MED ORDER — BACLOFEN 10 MG PO TABS
10.0000 mg | ORAL_TABLET | Freq: Three times a day (TID) | ORAL | Status: DC
  Administered 2013-11-06 – 2013-11-08 (×5): 10 mg via ORAL
  Filled 2013-11-06 (×10): qty 1

## 2013-11-06 MED ORDER — HYDROCHLOROTHIAZIDE 12.5 MG PO CAPS
12.5000 mg | ORAL_CAPSULE | Freq: Every day | ORAL | Status: DC
  Administered 2013-11-07 – 2013-11-08 (×2): 12.5 mg via ORAL
  Filled 2013-11-06 (×2): qty 1

## 2013-11-06 MED ORDER — ENOXAPARIN SODIUM 40 MG/0.4ML ~~LOC~~ SOLN
40.0000 mg | SUBCUTANEOUS | Status: DC
  Administered 2013-11-06 – 2013-11-07 (×2): 40 mg via SUBCUTANEOUS
  Filled 2013-11-06 (×3): qty 0.4

## 2013-11-06 MED ORDER — BENZONATATE 100 MG PO CAPS
200.0000 mg | ORAL_CAPSULE | Freq: Three times a day (TID) | ORAL | Status: DC | PRN
  Administered 2013-11-06 – 2013-11-07 (×2): 200 mg via ORAL
  Filled 2013-11-06 (×3): qty 2

## 2013-11-06 MED ORDER — LEVOTHYROXINE SODIUM 175 MCG PO TABS
175.0000 ug | ORAL_TABLET | Freq: Every day | ORAL | Status: DC
  Administered 2013-11-07 – 2013-11-08 (×2): 175 ug via ORAL
  Filled 2013-11-06 (×3): qty 1

## 2013-11-06 MED ORDER — ACETAMINOPHEN 325 MG PO TABS
650.0000 mg | ORAL_TABLET | Freq: Once | ORAL | Status: AC
Start: 2013-11-06 — End: 2013-11-06
  Administered 2013-11-06: 650 mg via ORAL
  Filled 2013-11-06: qty 2

## 2013-11-06 MED ORDER — ALBUTEROL SULFATE HFA 108 (90 BASE) MCG/ACT IN AERS: 2.0000 | INHALATION_SPRAY | Freq: Four times a day (QID) | RESPIRATORY_TRACT | Status: DC | PRN

## 2013-11-06 MED ORDER — ACETAMINOPHEN 500 MG PO TABS
500.0000 mg | ORAL_TABLET | Freq: Four times a day (QID) | ORAL | Status: DC | PRN
  Administered 2013-11-06 – 2013-11-08 (×2): 500 mg via ORAL
  Filled 2013-11-06 (×2): qty 1

## 2013-11-06 MED ORDER — ALPRAZOLAM 0.5 MG PO TABS
1.0000 mg | ORAL_TABLET | Freq: Three times a day (TID) | ORAL | Status: DC | PRN
  Administered 2013-11-07 – 2013-11-08 (×3): 1 mg via ORAL
  Filled 2013-11-06 (×3): qty 2

## 2013-11-06 MED ORDER — LOSARTAN POTASSIUM-HCTZ 50-12.5 MG PO TABS: 1.0000 | ORAL_TABLET | Freq: Every day | ORAL | Status: DC

## 2013-11-06 MED ORDER — FLUTICASONE PROPIONATE 50 MCG/ACT NA SUSP
1.0000 | Freq: Every day | NASAL | Status: DC
  Administered 2013-11-06: 1 via NASAL
  Filled 2013-11-06: qty 16

## 2013-11-06 NOTE — ED Notes (Signed)
I have just phoned report to [place], Charity fundraiser at [employer].  [facility] has arrived as I write this.  Pt. Remains in no distress.

## 2013-11-06 NOTE — ED Notes (Signed)
Bed: WA03 Expected date:  Expected time:  Means of arrival:  Comments: 

## 2013-11-06 NOTE — Progress Notes (Signed)
Clinical Social Work Department BRIEF PSYCHOSOCIAL ASSESSMENT 11/06/2013  Patient:  Paula [name], Paula [name]     Account Number:  [id number]     Admit date:  11/06/2013  Clinical Social Worker:  Mariann Laster  Date/Time:  11/06/2013 04:06 PM  Referred by:  CSW  Date Referred:  11/06/2013 Referred for  Psychosocial assessment   Other Referral:   Interview type:  Patient Other interview type:   Pt's husband, Paula [name], was at bedside.    PSYCHOSOCIAL DATA Living Status:  HUSBAND Admitted from facility:   Level of care:   Primary support name:  [name] Primary support relationship to patient:  SPOUSE Degree of support available:   strong.    CURRENT CONCERNS Current Concerns  Other - See comment   Other Concerns:   Pt and husband are interested in home health or private duty nursing.    SOCIAL WORK ASSESSMENT / PLAN CSW met with pt and pt's husband.    Pt and husband live in a one-floor house. Pt states her husband is her primary support person. Husband's sister-in-law is another support, and she lives in [place]. Husband states they have no other supports in the area.    Pt and husband lived at [facility] briefly in two separate instances. They stayed only for a short time in both instances, and stated they did not want to live there again.    Pt and husband expressed interest in having some additional help in the home. CSW explained home health and private duty nursing, as well as what insurance would cover.    Plan: Follow patient as medical tx plan progresses. Pt may require assistance with post-acute placement or home health needs.   Assessment/plan status:  Psychosocial Support/Ongoing Assessment of Needs Other assessment/ plan:   Information/referral to community resources:   none at this time    PATIENT'S/FAMILY'S RESPONSE TO PLAN OF CARE: Pt thanked CSW for her time. Husband thanked CSW for directions to [facility] and overview of home health options.      [place] Spaniel Lyons, [phone]     ED CSW  4:16pm

## 2013-11-06 NOTE — H&P (Signed)
[facility] History and Physical  Paula [name]:[id number] DOB: 1927-06-01 DOA: 11/06/2013  Referring physician: EDP PCP: Rene Paci, MD   Chief Complaint: came in for left facial droop and slurred speech this am .  HPI: Paula [name] is a 79 y.o. female with prior h/o hypertension, dm, COPD, AAA, presents to ED, with left facial droop and slurred speech since this am. Pt appeared very anxious ans reported that she felt left side of the face heavy with pain and tingling and numbness . She also reported that she had difficulty speaking. On arrival to ED, SHE had an MRI brain done which was negative for stroke. Her symptoms improved after arrival to ED. Neurology was consulted and plan was to transfer pt to [facility] for further evaluation of TIA.    Review of Systems:  Constitutional:  No weight loss, night sweats, Fevers, chills, fatigue.  HEENT:  No headaches, Difficulty swallowing,Tooth/dental problems,Sore throat,  No sneezing, itching, ear ache, nasal congestion, post nasal drip,  Cardio-vascular:  No chest pain, Orthopnea, PND, swelling in lower extremities, anasarca, dizziness, palpitations  GI:  No heartburn, indigestion, abdominal pain, nausea, vomiting, diarrhea, change in bowel habits, loss of appetite  Resp:  No shortness of breath with exertion or at rest. No excess mucus, no productive cough, No non-productive cough, No coughing up of blood.No change in color of mucus.No wheezing.No chest wall deformity  Skin:  no rash or lesions.  GU:  no dysuria, change in color of urine, no urgency or frequency. No flank pain.  Musculoskeletal:  No joint pain or swelling. No decreased range of motion. No back pain.  Psych:  No change in mood or affect. No depression or anxiety. No memory loss.   Past Medical History  Diagnosis Date  . Depression   . Ejection fraction     EF 40%, echo, November, 2012  / in improved, EF 60%, echo, December, 2012  . Contrast media allergy      Patient feels poorly with contrast  . Status post AAA (abdominal aortic aneurysm) repair     Surgical repair, Dr. Hart Rochester, December 27, 2011  . Pre-syncope     vasovagal w/ heart block  . Parotid mass     has refused further eval 10/2011  . GERD (gastroesophageal reflux disease)   . Anxiety   . Facial tic     L sided spasms - Botox trial summer 2013, ?effective  . Macular degeneration   . AAA (abdominal aortic aneurysm)     s/p repair 12/2011  . Congestive heart failure     NL EF 10/2011 echo   . Diabetes mellitus type II, controlled   . Hypothyroidism   . Sinus bradycardia 09/19/2011    Occurring simultaneously with complete heart block   . Hypertension   . COPD (chronic obstructive pulmonary disease)   . Abdominal spasms     hemifacial-treated w/ botox xeomin -Dr Terrace Arabia   Past Surgical History  Procedure Laterality Date  . Cholecystectomy    . Knee cartilage surgery      left  . Sinus surgery with instatrak    . Appendectomy    . Oophorectomy      ovarian cyst  . Cataract extraction      bilateral  . Bladder repair    . US echocardiography  10/14/11  . Abdominal aortic aneurysm repair  12/27/2011    Procedure: ANEURYSM ABDOMINAL AORTIC REPAIR;  Surgeon: Josephina Gip, MD;  Location: [facility] OR;  Service:  Vascular;  Laterality: N/A;  Resection and Grafting Abdominal Aortic Aneurysm , Aorta Bi Iliac.  Marland Kitchen Pacemaker insertion  11/12  . Cardiac catheterization  11/04/11   Social History:  reports that she quit smoking about 2 years ago. Her smoking use included Cigarettes. She has a 30 pack-year smoking history. She has never used smokeless tobacco. She reports that she does not drink alcohol or use illicit drugs.  Allergies  Allergen Reactions  . Bactrim [Sulfamethoxazole-Tmp Ds]   . Contrast Media [Iodinated Diagnostic Agents] Anaphylaxis  . Gadolinium Derivatives   . Iohexol Anaphylaxis, Shortness Of Breath and Swelling     Desc: PT STATES SHE HAD A SEVERE REACTION TO IV  CONRAST WITH THROAT SWELLING AND SOB. SHE WAS ADMITTED TO THE HOSPITAL. SHE HAS NEVER HAD CONTRAST AGAIN.   Marland Kitchen Penicillins Rash  . Sulfa Antibiotics Anaphylaxis  . Avelox [Moxifloxacin Hcl In Nacl] Rash  . Ciprofloxacin Itching  . Delsym [Dextromethorphan] Other (See Comments)    Hallucination  . Hydromet [Hydrocodone-Homatropine] Other (See Comments)    Pt states med make her hallucinate    Family History  Problem Relation Age of Onset  . Esophageal cancer Father   . Cancer Father   . Breast cancer Sister   . Cancer Sister   . Colon cancer Sister   . Heart disease Sister   . Heart disease Mother   . Heart disease Son      Prior to Admission medications   Medication Sig Start Date End Date Taking? Authorizing Provider  acetaminophen (TYLENOL) 500 MG tablet Take 500 mg by mouth every 6 (six) hours as needed (pain).   Yes Historical Provider, MD  albuterol (PROVENTIL HFA;VENTOLIN HFA) 108 (90 BASE) MCG/ACT inhaler Inhale 2 puffs into the lungs every 6 (six) hours as needed for wheezing. 10/27/13  Yes Newt Lukes, MD  ALPRAZolam Prudy Feeler) 1 MG tablet Take 1 mg by mouth 3 (three) times daily as needed for anxiety.   Yes Historical Provider, MD  aspirin EC 81 MG tablet Take 81 mg by mouth every morning.    Yes Historical Provider, MD  baclofen (LIORESAL) 10 MG tablet Take 10 mg by mouth 3 (three) times daily.   Yes Historical Provider, MD  benzonatate (TESSALON) 200 MG capsule Take 200 mg by mouth 3 (three) times daily as needed for cough.   Yes Historical Provider, MD  calcium carbonate (OS-CAL) 600 MG TABS Take 600 mg by mouth every other day.    Yes Historical Provider, MD  carvedilol (COREG) 6.25 MG tablet Take 1 tablet (6.25 mg total) by mouth 2 (two) times daily with a meal. 10/14/13  Yes Newt Lukes, MD  cetirizine (ZYRTEC) 10 MG tablet Take 10 mg by mouth daily as needed for allergies.   Yes Historical Provider, MD  Cholecalciferol (VITAMIN D3) 5000 UNITS TABS Take 1  tablet by mouth every other day.    Yes Historical Provider, MD  furosemide (LASIX) 20 MG tablet Take 1 tablet (20 mg total) by mouth daily. 07/13/13  Yes Rodolph Bong, MD  guaiFENesin (MUCINEX) 600 MG 12 hr tablet Take 1 tablet (600 mg total) by mouth 2 (two) times daily. 06/30/13  Yes Newt Lukes, MD  Ipratropium-Albuterol (COMBIVENT) 20-100 MCG/ACT AERS respimat Inhale 1 puff into the lungs every 6 (six) hours as needed for wheezing or shortness of breath. 10/20/13  Yes Leslye Peer, MD  levothyroxine (SYNTHROID, LEVOTHROID) 175 MCG tablet Take 1 tablet (175 mcg total) by mouth daily.  03/26/13  Yes Newt Lukes, MD  losartan-hydrochlorothiazide (HYZAAR) 50-12.5 MG per tablet Take 1 tablet by mouth daily.  03/17/13  Yes Historical Provider, MD  metFORMIN (GLUCOPHAGE-XR) 500 MG 24 hr tablet Take 1 tablet (500 mg total) by mouth daily with breakfast. 07/19/13  Yes Newt Lukes, MD  mometasone (NASONEX) 50 MCG/ACT nasal spray Place 2 sprays into the nose daily. 10/20/13  Yes Newt Lukes, MD  ondansetron (ZOFRAN) 4 MG tablet Take 1 tablet (4 mg total) by mouth every 8 (eight) hours as needed for nausea. 11/10/12  Yes Newt Lukes, MD  pantoprazole (PROTONIX) 40 MG tablet Take 1 tablet by mouth  daily 09/09/13  Yes Newt Lukes, MD  potassium chloride SA (K-DUR,KLOR-CON) 20 MEQ tablet Take 1 tablet (20 mEq total) by mouth daily. 03/15/13  Yes Newt Lukes, MD  sertraline (ZOLOFT) 50 MG tablet Take 1 tablet (50 mg total) by mouth daily. 02/16/13  Yes Newt Lukes, MD   Physical Exam: Filed Vitals:   11/06/13 1600  BP: 152/66  Pulse: 81  Temp: 98.4 F (36.9 C)  Resp:     BP 152/66  Pulse 81  Temp(Src) 98.4 F (36.9 C) (Oral)  Resp 16  Wt 78.744 kg (173 lb 9.6 oz)  SpO2 95%  General:  Appears calm and comfortable Eyes: PERRL, normal lids, irises & conjunctiva ENT: grossly normal hearing, lips & tongue Neck: no LAD, masses or  thyromegaly Cardiovascular: RRR, no m/r/g. No LE edema. Telemetry: SR, no arrhythmias  Respiratory: CTA bilaterally, no w/r/r. Normal respiratory effort. Abdomen: soft, ntnd Skin: no rash or induration seen on limited exam Musculoskeletal: grossly normal tone BUE/BLE Psychiatric: grossly normal mood and affect, speech fluent and appropriate Neurologic: grossly non-focal. SLIGHTLY SLUrred speech, slight left facial droop.          Labs on Admission:  Basic Metabolic Panel:  Recent Labs Lab 11/06/13 0945 11/06/13 1005  NA 140 144  K 3.8 3.5  CL 103 103  CO2 27  --   GLUCOSE 138* 133*  BUN 15 14  CREATININE 0.96 1.10  CALCIUM 9.5  --    Liver Function Tests:  Recent Labs Lab 11/06/13 0945  AST 17  ALT 16  ALKPHOS 56  BILITOT 0.5  PROT 6.1  ALBUMIN 3.5   No results found for this basename: LIPASE, AMYLASE,  in the last 168 hours No results found for this basename: AMMONIA,  in the last 168 hours CBC:  Recent Labs Lab 11/06/13 0945 11/06/13 1005  WBC 10.8*  --   NEUTROABS 7.6  --   HGB 10.3* 12.2  HCT 31.7* 36.0  MCV 103.9*  --   PLT 248  --    Cardiac Enzymes:  Recent Labs Lab 11/06/13 0945  TROPONINI <0.30    BNP (last 3 results)  Recent Labs  04/16/13 0211 07/11/13 1000 07/12/13 0305  PROBNP 293.2 697.2* 1369.0*   CBG:  Recent Labs Lab 11/06/13 0930 11/06/13 1635  GLUCAP 136* 110*    Radiological Exams on Admission: Mr Brain Wo Contrast  11/06/2013   CLINICAL DATA:  TIA.  Vision  EXAM: MRI HEAD WITHOUT CONTRAST  TECHNIQUE: Multiplanar, multiecho pulse sequences of the brain and surrounding structures were obtained without intravenous contrast.  COMPARISON:  CT 02/19/2013.  MRI 01/03/2013, 10/12/2011, 09/18/2011  FINDINGS: Generalized atrophy. Chronic microvascular ischemic changes in the white matter and pons.  Negative for acute infarct. Chronic micro hemorrhage right frontal white matter unchanged from the  prior MRI.  There is a  lesion in the right parotid gland measuring approximately 11 mm in diameter. This is unchanged from prior studies going back to 09/18/2011. This may be a parotid mass or lymph node. This could be a pleomorphic adenoma. It is stable. No intracranial mass or edema.  IMPRESSION: Atrophy and chronic microvascular ischemic change. No acute infarct.  Stable right parotid lesion.   Electronically Signed   By: Marlan Palau M.D.   On: 11/06/2013 11:54    EKG: pending.   Assessment/Plan Active Problems:   COPD (chronic obstructive pulmonary disease) with emphysema   Diabetes mellitus type II, controlled   Hypothyroidism   Depression   TIA (transient ischemic attack)   1. TIA: - admit to telemetry. Transfer the plan to [facility] hospital. - MRI brain neg for acute stroke - MRA head and neck , echocardiogram and carotid duplex ordered.  - serial troponin - lipid panel and hgba1c ordered.  - pt/ot /slp evaluattion.  - neurology consulted by EDP.  - please call neurology when pt arrives to the floor.  - pt was on baby aspirin at home, she was put on 325 mg of aspirin here.   2. Hypertension: - permissive hypertension.   3. Diabetes Mellitus: - SSI.   4. HYPOTHYROIDISM - resume synthroid.   5. Copd: - no wheezing heard.  - BD as needed.   6. DVT prophylaxis.   Code Status: DNR Family Communication: discussed the plan of care with the patient and husband. Disposition Plan: transfer to telemetry.  Time spent: 75 min  [facility] Pager [phone]

## 2013-11-06 NOTE — Consult Note (Signed)
Referring Physician: Dr. Gonzella Lex    Chief Complaint: Left facial droop, paresthesias and slurred speech.  HPI: Paula [name] is an 79 y.o. female with a history of diabetes mellitus, hypertension, COPD, hypothyroidism and equivocal TIA 2 years ago, presenting with new onset left facial droop and paresthesias, along with slurred speech. Onset was 6 AM this morning. Patient has been taking aspirin 81 mg per day. MRI of her brain showed no signs of acute stroke. Facial drooping is markedly improved, as has facial paresthesias. Dysarthria has resolved. NIH stroke scale was 1.  LSN: 6:00 AM on 11/06/2013 tPA Given: No: Rapidly resolving deficits MRankin: 0  Past Medical History  Diagnosis Date  . Depression   . Ejection fraction     EF 40%, echo, November, 2012  / in improved, EF 60%, echo, December, 2012  . Contrast media allergy     Patient feels poorly with contrast  . Status post AAA (abdominal aortic aneurysm) repair     Surgical repair, Dr. Hart Rochester, December 27, 2011  . Pre-syncope     vasovagal w/ heart block  . Parotid mass     has refused further eval 10/2011  . GERD (gastroesophageal reflux disease)   . Anxiety   . Facial tic     L sided spasms - Botox trial summer 2013, ?effective  . Macular degeneration   . AAA (abdominal aortic aneurysm)     s/p repair 12/2011  . Congestive heart failure     NL EF 10/2011 echo   . Diabetes mellitus type II, controlled   . Hypothyroidism   . Sinus bradycardia 09/19/2011    Occurring simultaneously with complete heart block   . Hypertension   . COPD (chronic obstructive pulmonary disease)   . Abdominal spasms     hemifacial-treated w/ botox xeomin -Dr Terrace Arabia    Family History  Problem Relation Age of Onset  . Esophageal cancer Father   . Cancer Father   . Breast cancer Sister   . Cancer Sister   . Colon cancer Sister   . Heart disease Sister   . Heart disease Mother   . Heart disease Son      Medications: I have reviewed the  patient's current medications.  ROS: History obtained from spouse and the patient  General ROS: negative for - chills, fatigue, fever, night sweats, weight gain or weight loss Psychological ROS: negative for - behavioral disorder, hallucinations, memory difficulties, mood swings or suicidal ideation Ophthalmic ROS: negative for - blurry vision, double vision, eye pain or loss of vision ENT ROS: negative for - epistaxis, nasal discharge, oral lesions, sore throat, tinnitus or vertigo Allergy and Immunology ROS: negative for - hives or itchy/watery eyes Hematological and Lymphatic ROS: negative for - bleeding problems, bruising or swollen lymph nodes Endocrine ROS: negative for - galactorrhea, hair pattern changes, polydipsia/polyuria or temperature intolerance Respiratory ROS: negative for - cough, hemoptysis, shortness of breath or wheezing Cardiovascular ROS: negative for - chest pain, dyspnea on exertion, edema or irregular heartbeat Gastrointestinal ROS: negative for - abdominal pain, diarrhea, hematemesis, nausea/vomiting or stool incontinence Genito-Urinary ROS: negative for - dysuria, hematuria, incontinence or urinary frequency/urgency Musculoskeletal ROS: negative for - joint swelling or muscular weakness Neurological ROS: as noted in HPI Dermatological ROS: negative for rash and skin lesion changes  Physical Examination: Blood pressure 161/77, pulse 79, temperature 98.4 F (36.9 C), temperature source Oral, resp. rate 18, weight 78.744 kg (173 lb 9.6 oz), SpO2 94.00%.  Neurologic Examination: Mental Status:  Alert, oriented, thought content appropriate.  Speech fluent without evidence of aphasia. Able to follow commands without difficulty. Cranial Nerves: II-Visual fields were normal. III/IV/VI-Pupils were equal and reacted. Extraocular movements were full and conjugate.    V/VII-no facial numbness; mild flattening of left nasolabial fold. VIII-normal. X-normal speech and  symmetrical palatal movement. Motor: 5/5 bilaterally with normal tone and bulk Sensory: Normal throughout. Deep Tendon Reflexes: 1+ and symmetric. Plantars: Flexor bilaterally Cerebellar: Normal finger-to-nose testing. Carotid auscultation: Normal  Dg Chest 2 View  11/06/2013   CLINICAL DATA:  Stroke.  EXAM: CHEST  2 VIEW  COMPARISON:  08/23/2013  FINDINGS: Mild enlargement of cardiac silhouette. The mediastinum is normal in contour. No hilar masses.  There are increased markings at the lung bases, stable. No convincing infiltrate and no pulmonary edema. No pleural effusion or pneumothorax.  The bony thorax is demineralized but grossly intact.  IMPRESSION: No acute cardiopulmonary disease.   Electronically Signed   By: Amie Portland M.D.   On: 11/06/2013 20:40   Mr Brain Wo Contrast  11/06/2013   CLINICAL DATA:  TIA.  Vision  EXAM: MRI HEAD WITHOUT CONTRAST  TECHNIQUE: Multiplanar, multiecho pulse sequences of the brain and surrounding structures were obtained without intravenous contrast.  COMPARISON:  CT 02/19/2013.  MRI 01/03/2013, 10/12/2011, 09/18/2011  FINDINGS: Generalized atrophy. Chronic microvascular ischemic changes in the white matter and pons.  Negative for acute infarct. Chronic micro hemorrhage right frontal white matter unchanged from the prior MRI.  There is a lesion in the right parotid gland measuring approximately 11 mm in diameter. This is unchanged from prior studies going back to 09/18/2011. This may be a parotid mass or lymph node. This could be a pleomorphic adenoma. It is stable. No intracranial mass or edema.  IMPRESSION: Atrophy and chronic microvascular ischemic change. No acute infarct.  Stable right parotid lesion.   Electronically Signed   By: Marlan Palau M.D.   On: 11/06/2013 11:54    Assessment: 79 y.o. female with multiple risk factors for stroke as well as history of equivocal TIA 2 years ago, presenting with probable right subcortical MCA territory  TIA.  Stroke Risk Factors - diabetes mellitus and hypertension  Plan: 1. HgbA1c, fasting lipid panel 2. MRA  of the brain without contrast 3. Echocardiogram 4. Carotid dopplers 5. Prophylactic therapy-Antiplatelet med: Aspirin  6. Risk factor modification 7. Telemetry monitoring   C.R. Roseanne Reno, MD [facility] [phone]  11/06/2013, 9:03 PM

## 2013-11-06 NOTE — ED Notes (Signed)
She has been back from MRI for a bit; and remains awake, alert and in no distress.  Her husband states that for a brief interval upon first awakening this morning, he felt that pt. Had some left-sided facial droop.  He states pt. Has chronic left facial "twitching".

## 2013-11-06 NOTE — ED Notes (Signed)
EMS were called by pt's. Husband with c/o of "more confusion for past 2 days".  Pt. Is awake, alert and in no distress.  She is oriented to her situation; and place; and has clear speech.  Her skin is pale, warm and dry and she is breathing normally.  She denies pain; and tells me "I just don't feel good".

## 2013-11-06 NOTE — ED Provider Notes (Signed)
CSN: [id number]     Arrival date & time 11/06/13  0840 History   First MD Initiated Contact with Patient 11/06/13 [phone]     Chief Complaint  Patient presents with  . Altered Mental Status    Resolved   (Consider location/radiation/quality/duration/timing/severity/associated sxs/prior Treatment) HPI  This is an 79 year old female with history of diabetes, COPD, AAA, hypertension who presents with left facial droop and difficulty speaking prior to arrival.  Patient history obtained from patient and husband. Per the patient's husband approximately 2 hours prior to arrival, the patient had acute onset of left facial droop and difficulty finding her words. The patient states that she generally "doesn't feel well." She denies any weakness or numbness of the upper lower extremities. She at baseline has poor vision but denies any new vision changes. Patient has improved and states that she still does not feel well but feels that the droop and her speech has gotten better. The husband agrees. She denies any chest pain or shortness of breath. She denies any urinary symptoms or fevers.  Past Medical History  Diagnosis Date  . Depression   . Ejection fraction     EF 40%, echo, November, 2012  / in improved, EF 60%, echo, December, 2012  . Contrast media allergy     Patient feels poorly with contrast  . Status post AAA (abdominal aortic aneurysm) repair     Surgical repair, Dr. Hart Rochester, December 27, 2011  . Pre-syncope     vasovagal w/ heart block  . Parotid mass     has refused further eval 10/2011  . GERD (gastroesophageal reflux disease)   . Anxiety   . Facial tic     L sided spasms - Botox trial summer 2013, ?effective  . Macular degeneration   . AAA (abdominal aortic aneurysm)     s/p repair 12/2011  . Congestive heart failure     NL EF 10/2011 echo   . Diabetes mellitus type II, controlled   . Hypothyroidism   . Sinus bradycardia 09/19/2011    Occurring simultaneously with complete heart  block   . Hypertension   . COPD (chronic obstructive pulmonary disease)   . Abdominal spasms     hemifacial-treated w/ botox xeomin -Dr Terrace Arabia   Past Surgical History  Procedure Laterality Date  . Cholecystectomy    . Knee cartilage surgery      left  . Sinus surgery with instatrak    . Appendectomy    . Oophorectomy      ovarian cyst  . Cataract extraction      bilateral  . Bladder repair    . US echocardiography  10/14/11  . Abdominal aortic aneurysm repair  12/27/2011    Procedure: ANEURYSM ABDOMINAL AORTIC REPAIR;  Surgeon: Josephina Gip, MD;  Location: [facility] OR;  Service: Vascular;  Laterality: N/A;  Resection and Grafting Abdominal Aortic Aneurysm , Aorta Bi Iliac.  Marland Kitchen Pacemaker insertion  11/12  . Cardiac catheterization  11/04/11   Family History  Problem Relation Age of Onset  . Esophageal cancer Father   . Cancer Father   . Breast cancer Sister   . Cancer Sister   . Colon cancer Sister   . Heart disease Sister   . Heart disease Mother   . Heart disease Son    History  Substance Use Topics  . Smoking status: Former Smoker -- 1.00 packs/day for 30 years    Types: Cigarettes    Quit date: 08/31/2011  .  Smokeless tobacco: Never Used  . Alcohol Use: No   OB History   Grav Para Term Preterm Abortions TAB SAB Ect Mult Living                 Review of Systems  Constitutional: Negative for fever.  Respiratory: Negative for cough, chest tightness and shortness of breath.   Cardiovascular: Negative for chest pain.  Gastrointestinal: Negative for nausea, vomiting and abdominal pain.  Genitourinary: Negative for dysuria.  Musculoskeletal: Negative for back pain.  Skin: Negative for wound.  Neurological: Positive for facial asymmetry and speech difficulty. Negative for headaches.  Psychiatric/Behavioral: Negative for confusion.  All other systems reviewed and are negative.    Allergies  Bactrim; Contrast media; Gadolinium derivatives; Iohexol; Penicillins; Sulfa  antibiotics; Avelox; Ciprofloxacin; Delsym; and Hydromet  Home Medications   No current outpatient prescriptions on file. BP 152/66  Pulse 81  Temp(Src) 98.4 F (36.9 C) (Oral)  Resp 16  Wt 173 lb 9.6 oz (78.744 kg)  SpO2 95% Physical Exam  Nursing note and vitals reviewed. Constitutional: She is oriented to person, place, and time. No distress.  Elderly  HENT:  Head: Normocephalic and atraumatic.  Mucous membranes dry  Eyes: Pupils are equal, round, and reactive to light.  Neck: Neck supple. No JVD present.  Cardiovascular: Normal rate, regular rhythm and normal heart sounds.   No murmur heard. Pulmonary/Chest: Effort normal and breath sounds normal. No respiratory distress. She has no wheezes.  Abdominal: Soft. Bowel sounds are normal. There is no tenderness.  Neurological: She is alert and oriented to person, place, and time. No cranial nerve deficit.  Coordination intact to finger-nose-finger, symmetric bilateral upper and lower extremity strength, patient has fluent speech and is able to name and repeat, she is oriented x3  Skin: Skin is warm and dry.  Psychiatric: She has a normal mood and affect.    ED Course  Procedures (including critical care time) Labs Review Labs Reviewed  CBC WITH DIFFERENTIAL - Abnormal; Notable for the following:    WBC 10.8 (*)    RBC 3.05 (*)    Hemoglobin 10.3 (*)    HCT 31.7 (*)    MCV 103.9 (*)    All other components within normal limits  COMPREHENSIVE METABOLIC PANEL - Abnormal; Notable for the following:    Glucose, Bld 138 (*)    GFR calc non Af Amer 53 (*)    GFR calc Af Amer 61 (*)    All other components within normal limits  APTT - Abnormal; Notable for the following:    aPTT 43 (*)    All other components within normal limits  URINE RAPID DRUG SCREEN (HOSP PERFORMED) - Abnormal; Notable for the following:    Benzodiazepines POSITIVE (*)    All other components within normal limits  GLUCOSE, CAPILLARY - Abnormal;  Notable for the following:    Glucose-Capillary 136 (*)    All other components within normal limits  POCT I-STAT, CHEM 8 - Abnormal; Notable for the following:    Glucose, Bld 133 (*)    All other components within normal limits  URINALYSIS, ROUTINE W REFLEX MICROSCOPIC  ETHANOL  PROTIME-INR  TROPONIN I  POCT I-STAT TROPONIN I   Imaging Review Mr Brain Wo Contrast  11/06/2013   CLINICAL DATA:  TIA.  Vision  EXAM: MRI HEAD WITHOUT CONTRAST  TECHNIQUE: Multiplanar, multiecho pulse sequences of the brain and surrounding structures were obtained without intravenous contrast.  COMPARISON:  CT 02/19/2013.  MRI  01/03/2013, 10/12/2011, 09/18/2011  FINDINGS: Generalized atrophy. Chronic microvascular ischemic changes in the white matter and pons.  Negative for acute infarct. Chronic micro hemorrhage right frontal white matter unchanged from the prior MRI.  There is a lesion in the right parotid gland measuring approximately 11 mm in diameter. This is unchanged from prior studies going back to 09/18/2011. This may be a parotid mass or lymph node. This could be a pleomorphic adenoma. It is stable. No intracranial mass or edema.  IMPRESSION: Atrophy and chronic microvascular ischemic change. No acute infarct.  Stable right parotid lesion.   Electronically Signed   By: Marlan Palau M.D.   On: 11/06/2013 11:54    EKG Interpretation    Date/Time:    Ventricular Rate:    PR Interval:    QRS Duration:   QT Interval:    QTC Calculation:   R Axis:     Text Interpretation:             Independently reviewed by myself: Normal sinus rhythm with a rate of 77, no evidence of acute ST elevation or ischemia MDM   1. TIA (transient ischemic attack)    Patient presents with difficulty speaking and left facial droop. The symptoms have largely resolved. Neurological exam is reassuring. Concern for TIA. Labwork was initiated and patient was sent to the MRI machine. MRI is negative for acute stroke.  Discuss with Dr. Amada Jupiter who feels patient needs further evaluation with TIA workup.    Shon Baton, MD 11/06/13 [phone]

## 2013-11-07 ENCOUNTER — Inpatient Hospital Stay (HOSPITAL_COMMUNITY): Payer: Medicare Other

## 2013-11-07 DIAGNOSIS — G459 Transient cerebral ischemic attack, unspecified: Secondary | ICD-10-CM

## 2013-11-07 DIAGNOSIS — I10 Essential (primary) hypertension: Secondary | ICD-10-CM

## 2013-11-07 DIAGNOSIS — R0989 Other specified symptoms and signs involving the circulatory and respiratory systems: Secondary | ICD-10-CM

## 2013-11-07 DIAGNOSIS — F411 Generalized anxiety disorder: Secondary | ICD-10-CM

## 2013-11-07 DIAGNOSIS — J449 Chronic obstructive pulmonary disease, unspecified: Secondary | ICD-10-CM

## 2013-11-07 LAB — LIPID PANEL
Cholesterol: 153 mg/dL (ref 0–200)
HDL: 28 mg/dL — ABNORMAL LOW (ref >39)
LDL Cholesterol: 69 mg/dL (ref 0–99)
Total CHOL/HDL Ratio: 5.5 RATIO
VLDL: 56 mg/dL — ABNORMAL HIGH (ref 0–40)

## 2013-11-07 LAB — GLUCOSE, CAPILLARY: Glucose-Capillary: 149 mg/dL — ABNORMAL HIGH (ref 70–99)

## 2013-11-07 MED ORDER — GEMFIBROZIL 600 MG PO TABS
600.0000 mg | ORAL_TABLET | Freq: Two times a day (BID) | ORAL | Status: DC
  Administered 2013-11-08: 600 mg via ORAL
  Filled 2013-11-07 (×3): qty 1

## 2013-11-07 MED ORDER — INSULIN ASPART 100 UNIT/ML ~~LOC~~ SOLN
0.0000 [IU] | Freq: Three times a day (TID) | SUBCUTANEOUS | Status: DC
  Administered 2013-11-08: 2 [IU] via SUBCUTANEOUS

## 2013-11-07 NOTE — Progress Notes (Signed)
Occupational Therapy Treatment Patient Details Name: Paula [name] MRN: [id number] DOB: July 16, 1927 Today's Date: 11/07/2013 Time: 1308-6578 OT Time Calculation (min): 13 min  OT Assessment / Plan / Recommendation  History of present illness   Paula [name] is an 79 y.o. female with a history of diabetes mellitus, hypertension, COPD, hypothyroidism and equivocal TIA 2 years ago, presenting with new onset left facial droop and paresthesias, along with slurred speech. Onset was 6 AM 11/06/2013. Patient had been taking aspirin 81 mg per day. MRI of her brain showed no signs of acute stroke. Facial drooping had markedly improved, as had facial paresthesias. Dysarthria resolved. NIH stroke scale was 1.    OT comments  Pt attempting to get up and use bathroom. OT arrived to assisted with safe mobility to bathroom and to assist pt return to bed. Continue to recommend 24/7 supervision/assist for d/c home.  Follow Up Recommendations  Supervision/Assistance - 24 hour;No OT follow up    Barriers to Discharge       Equipment Recommendations  None recommended by OT    Recommendations for Other Services    Frequency Min 2X/week   Progress towards OT Goals Progress towards OT goals: Progressing toward goals  Plan Discharge plan remains appropriate    Precautions / Restrictions Precautions Precautions: Fall   Pertinent Vitals/Pain See vitals    ADL  Eating/Feeding: Performed;Set up Where Assessed - Eating/Feeding: Chair Grooming: Performed;Wash/dry hands;Min guard Where Assessed - Grooming: Unsupported standing Lower Body Dressing: Performed;Min guard Where Assessed - Lower Body Dressing: Unsupported sit to stand Toilet Transfer: Performed;Minimal assistance Toilet Transfer Method: Sit to Barista: Comfort height toilet Toileting - Clothing Manipulation and Hygiene: Performed;Min guard Where Assessed - Engineer, mining and Hygiene: Sit to stand from  3-in-1 or toilet Equipment Used: Rolling walker;Gait belt Transfers/Ambulation Related to ADLs: Min guard with occasional min assist for safety with RW. Pt with tendency to push RW too far ahead. ADL Comments: Pt requesting to use bathroom and to return to bed.  Assist pt with toileting tasks.  Pt requires constant supervision and occasional assist for safe use of RW during ambulation.  Pt presenting with short term memory deficits. She did not remember this therapist from previous session today (~approx. 2 hrs earlier) and was confused on whether she was allowed to get up with therapy.   This confusion seems to be baseline per husband report.    OT Diagnosis: Generalized weakness  OT Problem List: Impaired balance (sitting and/or standing);Decreased knowledge of use of DME or AE;Decreased strength OT Treatment Interventions: Self-care/ADL training;DME and/or AE instruction;Therapeutic activities;Patient/family education;Balance training   OT Goals(current goals can now be found in the care plan section) Acute Rehab OT Goals Patient Stated Goal: to return home with spouse OT Goal Formulation: With patient Time For Goal Achievement: 11/14/13 Potential to Achieve Goals: Good ADL Goals Pt Will Perform Lower Body Dressing: with set-up;sit to/from stand Pt Will Transfer to Toilet: with supervision;ambulating;regular height toilet;bedside commode Pt Will Perform Toileting - Clothing Manipulation and hygiene: with supervision;sit to/from stand  Visit Information  Last OT Received On: 11/07/13 Assistance Needed: +1    Subjective Data      Prior Functioning  Home Living Family/patient expects to be discharged to:: Private residence Living Arrangements: Spouse/significant other Available Help at Discharge: Family Type of Home: House Home Access: Stairs to enter [employer] of Steps: 1 step up from [employer]: None Home Layout: One level Home Equipment: Bedside  commode;Walker -  2 wheels Additional Comments: Pt takes sponge baths at home. Prior Function Level of Independence: Needs assistance ADL's / Homemaking Assistance Needed: Occasional assist with LB ADLs. Communication Communication: No difficulties    Cognition  Cognition Arousal/Alertness: Awake/alert Behavior During Therapy: WFL for tasks assessed/performed Overall Cognitive Status: History of cognitive impairments - at baseline Memory: Decreased short-term memory    Mobility  Bed Mobility Bed Mobility: Sit to Supine Supine to Sit: 5: Supervision Sitting - Scoot to Edge of Bed: 5: Supervision Sit to Supine: 5: Supervision Transfers Transfers: Sit to Stand;Stand to Sit Sit to Stand: 4: Min guard;From chair/3-in-1;From toilet Stand to Sit: 4: Min guard;To toilet;To bed Details for Transfer Assistance: Min guard for safety only.    Exercises      Balance     End of Session OT - End of Session Equipment Utilized During Treatment: Rolling walker Activity Tolerance: Patient tolerated treatment well Patient left: in bed;with family/visitor present;with call bell/phone within reach Nurse Communication: Mobility status  GO   11/07/2013 Cipriano Mile OTR/L Pager [phone] Office [phone]   Cipriano Mile 11/07/2013, 2:13 PM

## 2013-11-07 NOTE — Progress Notes (Signed)
Stroke Team Progress Note  HISTORY Paula [name] is an 79 y.o. female with a history of diabetes mellitus, hypertension, COPD, hypothyroidism and equivocal TIA 2 years ago, presenting with new onset left facial droop and paresthesias, along with slurred speech. Onset was 6 AM 11/06/2013. Patient had been taking aspirin 81 mg per day. MRI of her brain showed no signs of acute stroke. Facial drooping had markedly improved, as had facial paresthesias. Dysarthria resolved. NIH stroke scale was 1.  .LSN: 6:00 AM on 11/06/2013  tPA Given: No: Rapidly resolving deficits  MRankin: 0   SUBJECTIVE Husband at bedside. Pt reports symptoms come and go.Has had multiple episodes since TIA in 2012.  OBJECTIVE Most recent Vital Signs: Filed Vitals:   11/06/13 2146 11/07/13 0153 11/07/13 0359 11/07/13 0538  BP: 164/65 152/65 130/73 157/60  Pulse: 78 76 71 75  Temp: 98 F (36.7 C) 98.5 F (36.9 C) 97.7 F (36.5 C) 97.8 F (36.6 C)  TempSrc: Oral Oral Oral Oral  Resp: 18 18 18 18   Weight:      SpO2: 100% 93% 93% 98%   CBG (last 3)   Recent Labs  11/06/13 0930 11/06/13 1635  GLUCAP 136* 110*    IV Fluid Intake:     MEDICATIONS  . aspirin EC  325 mg Oral Daily  . baclofen  10 mg Oral TID  . carvedilol  6.25 mg Oral BID WC  . enoxaparin (LOVENOX) injection  40 mg Subcutaneous Q24H  . fluticasone  1 spray Each Nare Daily  . furosemide  20 mg Oral Daily  . losartan  50 mg Oral Daily   And  . hydrochlorothiazide  12.5 mg Oral Daily  . levothyroxine  175 mcg Oral QAC breakfast  . loratadine  10 mg Oral Daily  . pantoprazole  40 mg Oral Daily  . sertraline  50 mg Oral Daily   PRN:  acetaminophen, albuterol, ALPRAZolam, benzonatate, Ipratropium-Albuterol  Diet:  Carb Control thin liquids Activity:  Bedrest - will increase to up with assistance DVT Prophylaxis:  Lovenox  CLINICALLY SIGNIFICANT STUDIES Basic Metabolic Panel:   Recent Labs Lab 11/06/13 0945 11/06/13 1005  NA 140 144   K 3.8 3.5  CL 103 103  CO2 27  --   GLUCOSE 138* 133*  BUN 15 14  CREATININE 0.96 1.10  CALCIUM 9.5  --    Liver Function Tests:   Recent Labs Lab 11/06/13 0945  AST 17  ALT 16  ALKPHOS 56  BILITOT 0.5  PROT 6.1  ALBUMIN 3.5   CBC:   Recent Labs Lab 11/06/13 0945 11/06/13 1005  WBC 10.8*  --   NEUTROABS 7.6  --   HGB 10.3* 12.2  HCT 31.7* 36.0  MCV 103.9*  --   PLT 248  --    Coagulation:   Recent Labs Lab 11/06/13 0945  LABPROT 13.3  INR 1.03   Cardiac Enzymes:   Recent Labs Lab 11/06/13 0945  TROPONINI <0.30   Urinalysis:   Recent Labs Lab 11/06/13 1033  COLORURINE YELLOW  LABSPEC 1.024  PHURINE 6.0  GLUCOSEU NEGATIVE  HGBUR NEGATIVE  BILIRUBINUR NEGATIVE  KETONESUR NEGATIVE  PROTEINUR NEGATIVE  UROBILINOGEN 1.0  NITRITE NEGATIVE  LEUKOCYTESUR NEGATIVE   Lipid Panel    Component Value Date/Time   CHOL 149 02/10/2013 0938   TRIG 218.0* 02/10/2013 0938   HDL 30.10* 02/10/2013 0938   CHOLHDL 5 02/10/2013 0938   VLDL 43.6* 02/10/2013 0938   LDLCALC 80 11/04/2011 0657  HgbA1C  Lab Results  Component Value Date   HGBA1C 6.6* 07/11/2013    Urine Drug Screen:     Component Value Date/Time   LABOPIA NONE DETECTED 11/06/2013 1033   COCAINSCRNUR NONE DETECTED 11/06/2013 1033   LABBENZ POSITIVE* 11/06/2013 1033   AMPHETMU NONE DETECTED 11/06/2013 1033   THCU NONE DETECTED 11/06/2013 1033   LABBARB NONE DETECTED 11/06/2013 1033    Alcohol Level:   Recent Labs Lab 11/06/13 0945  ETH <11    Dg Chest 2 View 11/06/2013    No acute cardiopulmonary disease.      Mr Brain Wo Contrast 11/06/2013    Atrophy and chronic microvascular ischemic change. No acute infarct.  Stable right parotid lesion.     2D Echocardiogram   pending  Carotid Doppler  pending   EKG  - from 10/15/2013 - sinus rhythm rate 61 beats per minute  Therapy Recommendations pending  Physical Exam    Neurologic Examination:   Mental Status:  Alert, oriented,  thought content appropriate. Speech fluent without evidence of aphasia. Able to follow commands without difficulty.  Cranial Nerves:  II-Visual fields were normal.  III/IV/VI-Pupils were equal and reacted. Extraocular movements were full and conjugate.  V/VII-no facial numbness; mild flattening of left nasolabial fold.  VIII-normal.  X-normal speech and symmetrical palatal movement.  Motor: 5/5 bilaterally with normal tone and bulk  Sensory: Normal throughout.  Deep Tendon Reflexes: 1+ and symmetric.  Plantars: Flexor bilaterally  Cerebellar: Normal finger-to-nose testing.     ASSESSMENT [name] is a 79 y.o. female presenting with a left facial droop, left facial numbness, and dysarthria. TPA was not given secondary to an NIH scale of 1 and rapidly resolving deficits. An MRI showed no acute infarct.  On aspirin 81 mg orally every day prior to admission. Now on aspirin 325 mg orally every day for secondary stroke prevention. Patient with resultant resolution of deficits. Work up underway.   Diabetes mellitus - hemoglobin A1c 6.6  Hypertension history  Cholesterol 149 LDL 80  TIA history  Dementia  Hospital day # 1  TREATMENT/PLAN  Continue aspirin 325 mg orally every day for secondary stroke prevention.  Await 2-D echo and carotid Dopplers  Await therapy evaluations  Okay to discharge once workup is complete.  Followup with Dr. Pearlean Brownie in 2 months.  Delton See PA-C [facility] Pager [phone] 11/07/2013, 8:16 AM  I have personally obtained a history, examined the patient, evaluated imaging results, and formulated the assessment and plan of care. I agree with the above.  Increase ASA to 325. Pt is back to baseline. As per PT out pt physical therapy. Ok to d/c from neuro stand point.

## 2013-11-07 NOTE — Evaluation (Addendum)
Occupational Therapy Evaluation Patient Details Name: Paula [name] MRN: [id number] DOB: Jun 24, 1927 Today's Date: 11/07/2013 Time: 4098-1191 OT Time Calculation (min): 17 min  OT Assessment / Plan / Recommendation History of present illness   Paula [name] is an 79 y.o. female with a history of diabetes mellitus, hypertension, COPD, hypothyroidism and equivocal TIA 2 years ago, presenting with new onset left facial droop and paresthesias, along with slurred speech. Onset was 6 AM 11/06/2013. Patient had been taking aspirin 81 mg per day. MRI of her brain showed no signs of acute stroke. Facial drooping had markedly improved, as had facial paresthesias. Dysarthria resolved. NIH stroke scale was 1.    Clinical Impression   Pt admitted with above.  Pt seems near baseline but will benefit from acute OT services for at least one more session to ensure safe return home with husband.    OT Assessment  Patient needs continued OT Services    Follow Up Recommendations  Supervision/Assistance - 24 hour;No OT follow up    Barriers to Discharge      Equipment Recommendations  None recommended by OT    Recommendations for Other Services    Frequency  Min 2X/week    Precautions / Restrictions     Pertinent Vitals/Pain See vitals    ADL  Eating/Feeding: Performed;Set up Where Assessed - Eating/Feeding: Chair Grooming: Performed;Wash/dry hands;Set up Where Assessed - Grooming: Supported sitting Lower Body Dressing: Performed;Min guard Where Assessed - Lower Body Dressing: Unsupported sit to stand Toilet Transfer: Simulated;Min guard Toilet Transfer Method: Surveyor, minerals:  (bed to chair) Equipment Used: Rolling walker;Gait belt Transfers/Ambulation Related to ADLs: Min guard with RW ADL Comments: Pt donned undergarment sit to stand at bedside.  Pt requires set up assist due to visual deficits (baseline).  Requried max VCs during transfer for sequencing.  Pt  repeatedly asking "so what do you want me to do?" during stand pivot transfer.  Husband reports her behavior is baseline, question possible dementia?    OT Diagnosis: Generalized weakness  OT Problem List: Impaired balance (sitting and/or standing);Decreased knowledge of use of DME or AE;Decreased strength OT Treatment Interventions: Self-care/ADL training;DME and/or AE instruction;Therapeutic activities;Patient/family education;Balance training   OT Goals(Current goals can be found in the care plan section) Acute Rehab OT Goals Patient Stated Goal: to return home with spouse OT Goal Formulation: With patient Time For Goal Achievement: 11/14/13 Potential to Achieve Goals: Good  Visit Information          Prior Functioning     Home Living Family/patient expects to be discharged to:: Private residence Living Arrangements: Spouse/significant other Available Help at Discharge: Family Type of Home: House Home Access: Stairs to enter [employer] of Steps: 1 step up from [employer]: None Home Layout: One level Home Equipment: Bedside commode;Walker - 2 wheels Additional Comments: Pt takes sponge baths at home. Prior Function Level of Independence: Needs assistance ADL's / Homemaking Assistance Needed: Occasional assist with LB ADLs. Communication Communication: No difficulties         Vision/Perception Vision - History Baseline Vision: Wears glasses all the time Visual History: Macular degeneration Patient Visual Report: No change from baseline Vision - Assessment Additional Comments: Pt and spouse report she is nearly blind due to macular degeneration. Pt reports no acute changes in vision.   Cognition  Cognition Arousal/Alertness: Awake/alert Behavior During Therapy: WFL for tasks assessed/performed Overall Cognitive Status:  (Slow to process information)    Extremity/Trunk Assessment Upper Extremity Assessment  Upper Extremity  Assessment: Overall WFL for tasks assessed     Mobility Bed Mobility Bed Mobility: Supine to Sit;Sitting - Scoot to Edge of Bed Supine to Sit: 5: Supervision Sitting - Scoot to Edge of Bed: 5: Supervision Transfers Transfers: Sit to Stand;Stand to Sit Sit to Stand: 4: Min guard;From bed Stand to Sit: 4: Min guard;To chair/3-in-1 Details for Transfer Assistance: Min guard for safety only.     Exercise     Balance     End of Session OT - End of Session Equipment Utilized During Treatment: Rolling walker Activity Tolerance: Patient tolerated treatment well Patient left: in chair;with call bell/phone within reach;with family/visitor present Nurse Communication: Mobility status  GO   11/07/2013 Cipriano Mile OTR/L Pager [phone] Office [phone]   Cipriano Mile 11/07/2013, 1:05 PM

## 2013-11-07 NOTE — Progress Notes (Signed)
VASCULAR LAB PRELIMINARY  PRELIMINARY  PRELIMINARY  PRELIMINARY  Carotid Dopplers completed.    Preliminary report:  There is 1-39% ICA stenosis.  Vertebral artery flow is antegrade.    Johney Perotti, RVT 11/07/2013, 11:00 AM

## 2013-11-07 NOTE — Progress Notes (Signed)
  Echocardiogram 2D Echocardiogram has been performed.  Amandalynn Pitz FRANCES 11/07/2013, 9:29 AM

## 2013-11-07 NOTE — Progress Notes (Addendum)
[facility] PROGRESS NOTE  Paula [name] XBJ:[id number] DOB: 1927-07-18 DOA: 11/06/2013 PCP: Rene Paci, MD  Assessment/Plan: TIA Monitor on telemetry. MRI been negative for acute stroke. MRA head pending. 2-D echo and carotid Doppler unremarkable. Lipid panel shows elevated triglycerides. Will add gemfibrozil Appreciate  neurology recommendations. Patient on baby aspirin at home. Discharge on full dose aspirin  Aphasia and dysphagia  ordered swallow evaluation. Possibly MBS in am . Symptoms per patient is recurrent from previous ?TIAs  Hypertension Resume home dose losartan and HCTZ.  Continue Lasix  Hypothyroidism Continue Synthroid  COPD Continue albuterol inhaler  GERD Continue PPI  Diabetes mellitus Continue SSI  Code Status: DO NOT RESUSCITATE Family Communication: husband at bedside Disposition Plan: home after speech and swallow eval completed   Consultants:  Neurology  Procedures:  MRI been  Antibiotics:  None  HPI/Subjective: Patient denies any weakness but complains of ongoing dysphagia with lump in her throat and aphasia  Objective: Filed Vitals:   11/07/13 1127  BP: 140/63  Pulse: 79  Temp: 99.5 F (37.5 C)  Resp: 19   No intake or output data in the 24 hours ending 11/07/13 1733 Filed Weights   11/06/13 1600  Weight: 78.744 kg (173 lb 9.6 oz)    Exam:   General:  Elderly female in no acute distress  HEENT: No pallor, moist oral mucosa,   Chest: Clear to auscultation bilaterally, no added sound  CVS: Normal S1 and S2, no murmurs rub or gallop  Abdomen: Soft, nontender, nondistended, bowel sounds present  Extremities: Warm, no edema  CNS: AAO x3, some slurred speech noted which as per patient is chronic  Data Reviewed: Basic Metabolic Panel:  Recent Labs Lab 11/06/13 0945 11/06/13 1005  NA 140 144  K 3.8 3.5  CL 103 103  CO2 27  --   GLUCOSE 138* 133*  BUN 15 14  CREATININE 0.96 1.10  CALCIUM 9.5   --    Liver Function Tests:  Recent Labs Lab 11/06/13 0945  AST 17  ALT 16  ALKPHOS 56  BILITOT 0.5  PROT 6.1  ALBUMIN 3.5   No results found for this basename: LIPASE, AMYLASE,  in the last 168 hours No results found for this basename: AMMONIA,  in the last 168 hours CBC:  Recent Labs Lab 11/06/13 0945 11/06/13 1005  WBC 10.8*  --   NEUTROABS 7.6  --   HGB 10.3* 12.2  HCT 31.7* 36.0  MCV 103.9*  --   PLT 248  --    Cardiac Enzymes:  Recent Labs Lab 11/06/13 0945  TROPONINI <0.30   BNP (last 3 results)  Recent Labs  04/16/13 0211 07/11/13 1000 07/12/13 0305  PROBNP 293.2 697.2* 1369.0*   CBG:  Recent Labs Lab 11/06/13 0930 11/06/13 1635  GLUCAP 136* 110*    No results found for this or any previous visit (from the past 240 hour(s)).   Studies: Dg Chest 2 View  11/06/2013   CLINICAL DATA:  Stroke.  EXAM: CHEST  2 VIEW  COMPARISON:  08/23/2013  FINDINGS: Mild enlargement of cardiac silhouette. The mediastinum is normal in contour. No hilar masses.  There are increased markings at the lung bases, stable. No convincing infiltrate and no pulmonary edema. No pleural effusion or pneumothorax.  The bony thorax is demineralized but grossly intact.  IMPRESSION: No acute cardiopulmonary disease.   Electronically Signed   By: Amie Portland M.D.   On: 11/06/2013 20:40   Mr Brain Wo Contrast  11/06/2013   CLINICAL DATA:  TIA.  Vision  EXAM: MRI HEAD WITHOUT CONTRAST  TECHNIQUE: Multiplanar, multiecho pulse sequences of the brain and surrounding structures were obtained without intravenous contrast.  COMPARISON:  CT 02/19/2013.  MRI 01/03/2013, 10/12/2011, 09/18/2011  FINDINGS: Generalized atrophy. Chronic microvascular ischemic changes in the white matter and pons.  Negative for acute infarct. Chronic micro hemorrhage right frontal white matter unchanged from the prior MRI.  There is a lesion in the right parotid gland measuring approximately 11 mm in diameter. This  is unchanged from prior studies going back to 09/18/2011. This may be a parotid mass or lymph node. This could be a pleomorphic adenoma. It is stable. No intracranial mass or edema.  IMPRESSION: Atrophy and chronic microvascular ischemic change. No acute infarct.  Stable right parotid lesion.   Electronically Signed   By: Marlan Palau M.D.   On: 11/06/2013 11:54    Scheduled Meds: . aspirin EC  325 mg Oral Daily  . baclofen  10 mg Oral TID  . carvedilol  6.25 mg Oral BID WC  . enoxaparin (LOVENOX) injection  40 mg Subcutaneous Q24H  . fluticasone  1 spray Each Nare Daily  . furosemide  20 mg Oral Daily  . losartan  50 mg Oral Daily   And  . hydrochlorothiazide  12.5 mg Oral Daily  . levothyroxine  175 mcg Oral QAC breakfast  . loratadine  10 mg Oral Daily  . pantoprazole  40 mg Oral Daily  . sertraline  50 mg Oral Daily   Continuous Infusions:    Time spent: 25 minutes    Paula [name]  [facility] Pager [phone] If 7PM-7AM, please contact night-coverage at [email or web address], password [facility] 11/07/2013, 5:33 PM  LOS: 1 day

## 2013-11-07 NOTE — Evaluation (Signed)
Physical Therapy Evaluation Patient Details Name: Paula [name] MRN: [id number] DOB: 02/23/1927 Today's Date: 11/07/2013 Time: 6295-2841 PT Time Calculation (min): 27 min  PT Assessment / Plan / Recommendation History of Present Illness  Pt with episode of slurred speech and facial numbness.  Admitted for suspected TIA.  Clinical Impression  Pt admitted with possible TIA. Pt currently with functional limitations due to the deficits listed below (see PT Problem List).  Pt will benefit from skilled PT to increase their independence and safety with mobility to allow discharge to the venue listed below.       PT Assessment  Patient needs continued PT services    Follow Up Recommendations  Outpatient PT;Supervision/Assistance - 24 hour    Does the patient have the potential to tolerate intense rehabilitation      Barriers to Discharge        Equipment Recommendations  None recommended by PT    Recommendations for Other Services     Frequency Min 3X/week    Precautions / Restrictions Precautions Precautions: Fall   Pertinent Vitals/Pain 0/10      Mobility  Bed Mobility Bed Mobility: Sit to Supine Supine to Sit: 5: Supervision;With rails Sitting - Scoot to Edge of Bed: 5: Supervision Sit to Supine: 5: Supervision Transfers Sit to Stand: 4: Min guard;From bed;With upper extremity assist Stand to Sit: 4: Min guard;To chair/3-in-1;With armrests Details for Transfer Assistance: verbal cues for safety Ambulation/Gait Ambulation/Gait Assistance: 4: Min guard Ambulation Distance (Feet): 80 Feet Assistive device: Rolling walker Gait Pattern: Decreased stride length;Shuffle Gait velocity: decreased General Gait Details: verbal cues for foot clearance    Exercises     PT Diagnosis: Difficulty walking;Generalized weakness  PT Problem List: Decreased activity tolerance;Decreased strength;Decreased mobility;Decreased safety awareness PT Treatment Interventions: Gait  training;Stair training;Functional mobility training;Therapeutic activities;Therapeutic exercise;Patient/family education;Balance training     PT Goals(Current goals can be found in the care plan section) Acute Rehab PT Goals Patient Stated Goal: home PT Goal Formulation: With patient Time For Goal Achievement: 11/14/13 Potential to Achieve Goals: Good  Visit Information  Last PT Received On: 11/07/13 Assistance Needed: +1 History of Present Illness: Pt with episode of slurred speech and facial numbness.  Admitted for suspected TIA.       Prior Functioning  Home Living Family/patient expects to be discharged to:: Private residence Living Arrangements: Spouse/significant other Available Help at Discharge: Family Type of Home: House Home Access: Stairs to enter Secretary/administrator of Steps: 1 Entrance Stairs-Rails: None Home Layout: One level Home Equipment: Bedside commode;Walker - 2 wheels Additional Comments: Pt takes sponge baths at home. Prior Function Level of Independence: Needs assistance Gait / Transfers Assistance Needed: modified independent with RW ADL's / Homemaking Assistance Needed: occassional assist with LB ADLs Communication Communication: No difficulties Dominant Hand: Right    Cognition  Cognition Arousal/Alertness: Awake/alert Behavior During Therapy: WFL for tasks assessed/performed Overall Cognitive Status: History of cognitive impairments - at baseline Memory: Decreased short-term memory    Extremity/Trunk Assessment Upper Extremity Assessment Upper Extremity Assessment: Overall WFL for tasks assessed   Balance    End of Session PT - End of Session Equipment Utilized During Treatment: Gait belt Activity Tolerance: Patient tolerated treatment well Patient left: in chair;with call bell/phone within reach;with family/visitor present Nurse Communication: Mobility status  GP     Ilda Foil 11/07/2013, 3:11 PM  Aida Raider, PT   Office # [phone] Pager [phone]

## 2013-11-08 ENCOUNTER — Telehealth: Payer: Self-pay | Admitting: *Deleted

## 2013-11-08 ENCOUNTER — Ambulatory Visit: Payer: Medicare Other | Admitting: Podiatry

## 2013-11-08 LAB — GLUCOSE, CAPILLARY: Glucose-Capillary: 134 mg/dL — ABNORMAL HIGH (ref 70–99)

## 2013-11-08 MED ORDER — ASPIRIN 325 MG PO TBEC: 325.0000 mg | DELAYED_RELEASE_TABLET | Freq: Every day | ORAL | Status: DC

## 2013-11-08 NOTE — Evaluation (Signed)
Speech Language Pathology Evaluation Patient Details Name: Paula [name] MRN: [id number] DOB: Oct 20, 1927 Today's Date: 11/08/2013 Time: 1200-1230 SLP Time Calculation (min): 30 min  Problem List:  Patient Active Problem List   Diagnosis Date Noted  . TIA (transient ischemic attack) 11/06/2013  . Allergic rhinitis 10/12/2013  . Lumbago   . Facial spasm 04/08/2013  . Dementia 03/08/2013  . Headache 02/19/2013  . Chronic diastolic CHF (congestive heart failure) 02/11/2013  . Chronic asthmatic bronchitis with acute exacerbation 01/27/2013  . Hypertension   . Recurrent pneumonia   . AAA (abdominal aortic aneurysm) 07/07/2012  . Status post AAA (abdominal aortic aneurysm) repair   . Pre-syncope   . Depression 10/25/2011  . Anxiety 10/25/2011  . Facial twitching 10/13/2011  . Ejection fraction   . Contrast media allergy   . Hypothyroidism 09/19/2011  . Sinus bradycardia 09/19/2011  . Parotid mass 09/18/2011  . COPD (chronic obstructive pulmonary disease) with emphysema 09/15/2011  . Diabetes mellitus type II, controlled 09/15/2011   Past Medical History:  Past Medical History  Diagnosis Date  . Depression   . Ejection fraction     EF 40%, echo, November, 2012  / in improved, EF 60%, echo, December, 2012  . Contrast media allergy     Patient feels poorly with contrast  . Status post AAA (abdominal aortic aneurysm) repair     Surgical repair, Dr. Hart [place], December 27, 2011  . Pre-syncope     vasovagal w/ heart block  . Parotid mass     has refused further eval 10/2011  . GERD (gastroesophageal reflux disease)   . Anxiety   . Facial tic     L sided spasms - Botox trial summer 2013, ?effective  . Macular degeneration   . AAA (abdominal aortic aneurysm)     s/p repair 12/2011  . Congestive heart failure     NL EF 10/2011 echo   . Diabetes mellitus type II, controlled   . Hypothyroidism   . Sinus bradycardia 09/19/2011    Occurring simultaneously with complete heart block    . Hypertension   . COPD (chronic obstructive pulmonary disease)   . Abdominal spasms     hemifacial-treated w/ botox xeomin -Dr Terrace Arabia   Past Surgical History:  Past Surgical History  Procedure Laterality Date  . Cholecystectomy    . Knee cartilage surgery      left  . Sinus surgery with instatrak    . Appendectomy    . Oophorectomy      ovarian cyst  . Cataract extraction      bilateral  . Bladder repair    . US echocardiography  10/14/11  . Abdominal aortic aneurysm repair  12/27/2011    Procedure: ANEURYSM ABDOMINAL AORTIC REPAIR;  Surgeon: Josephina Gip, MD;  Location: [facility] OR;  Service: Vascular;  Laterality: N/A;  Resection and Grafting Abdominal Aortic Aneurysm , Aorta Bi Iliac.  Marland Kitchen Pacemaker insertion  11/12  . Cardiac catheterization  11/04/11   HPI:  Paula [name] is an 79 y.o. female with a history of diabetes mellitus, hypertension, COPD, hypothyroidism and equivocal TIA 2 years ago, presenting with new onset left facial droop and paresthesias, along with slurred speech. Onset was 6 AM 11/06/2013. Patient had been taking aspirin 81 mg per day. MRI of her brain showed no signs of acute stroke. Facial drooping had markedly improved, as had facial paresthesias. Dysarthria resolved.   Assessment / Plan / Recommendation Clinical Impression  Pt presents with baseline dysarthria, memory  deficits and word finding problems which are stable, but frusterating to pt. She reports that deficits are intermittent, but SLP observed frequent word finding at conversation level and imprecise articulatory contacts with multisyllabic words. Pts struggles with topic maintenance and selective attention. She may benefit from outpatient SLP therapy, though will allow and pt and husband to decide if they want any intervention since this is unchanged from baseline. No further acute needs, will sign off.     SLP Assessment  All further Speech Lanaguage Pathology  needs can be addressed in the next venue of  care    Follow Up Recommendations  Outpatient SLP    Frequency and Duration        Pertinent Vitals/Pain NA   SLP Goals     SLP Evaluation Prior Functioning  Cognitive/Linguistic Baseline: Baseline deficits Baseline deficit details: memory, word finding, dysarthria Type of Home: House  Lives With: Spouse Available Help at Discharge: Family Vocation: Retired   IT consultant  Overall Cognitive Status: History of cognitive impairments - at baseline Arousal/Alertness: Awake/alert Orientation Level: Oriented to person;Oriented to place;Oriented to situation;Oriented to time Attention: Focused;Sustained;Selective Focused Attention: Appears intact Sustained Attention: Appears intact Selective Attention: Impaired Selective Attention Impairment: Verbal complex;Functional complex Memory: Impaired Memory Impairment: Retrieval deficit;Decreased long term memory;Decreased short term memory Decreased Long Term Memory: Verbal complex Decreased Short Term Memory: Verbal complex Awareness: Appears intact Problem Solving: Appears intact Safety/Judgment: Appears intact    Comprehension  Auditory Comprehension Overall Auditory Comprehension: Appears within functional limits for tasks assessed    Expression Verbal Expression Overall Verbal Expression: Impaired Initiation: No impairment Automatic Speech: Name;Social Response Level of Generative/Spontaneous Verbalization: Conversation Repetition: No impairment Naming: Impairment Responsive: 76-100% accurate Confrontation: Within functional limits Convergent: 75-100% accurate Divergent: 75-100% accurate Other Naming Comments: repeatedly has difficutly finding names in conversational speech, relies on husband.  Pragmatics: Impairment Impairments: Topic maintenance;Topic appropriateness   Oral / Motor Oral Motor/Sensory Function Overall Oral Motor/Sensory Function: Impaired at baseline Labial ROM: Within Functional Limits Labial  Symmetry: Abnormal symmetry left Labial Strength: Within Functional Limits Labial Sensation: Within Functional Limits Lingual ROM: Within Functional Limits Lingual Symmetry: Within Functional Limits Lingual Strength: Reduced Lingual Sensation: Within Functional Limits Facial ROM: Within Functional Limits Facial Symmetry: Within Functional Limits Facial Strength: Within Functional Limits Facial Sensation: Within Functional Limits Velum: Within Functional Limits Mandible: Within Functional Limits Motor Speech Overall Motor Speech: Impaired Respiration: Within functional limits Phonation: Hoarse Resonance: Within functional limits Articulation: Impaired Level of Impairment: Phrase Intelligibility: Intelligibility reduced Word: 75-100% accurate Phrase: 50-74% accurate Sentence: 50-74% accurate Conversation: 50-74% accurate Motor Planning: Witnin functional limits Motor Speech Errors: Aware   GO    Harlon Ditty, MA CCC-SLP [phone]  Claudine Mouton 11/08/2013, 2:05 PM

## 2013-11-08 NOTE — Progress Notes (Signed)
Discharge instructions given. Pt verbalized understanding and all questions were answered.   DC plan. Home/slef care

## 2013-11-08 NOTE — Discharge Summary (Signed)
Physician Discharge Summary  Paula [name] HYQ:[id number] DOB: 04-07-1927 DOA: 11/06/2013  PCP: Rene Paci, MD  Admit date: 11/06/2013 Discharge date: 11/08/2013  Time spent: 35 minutes  Recommendations for Outpatient Follow-up:  1. Outpatient SLP 2. FLP 6 weeks  Discharge Diagnoses:  Active Problems:   COPD (chronic obstructive pulmonary disease) with emphysema   Diabetes mellitus type II, controlled   Hypothyroidism   Depression   TIA (transient ischemic attack)   Discharge Condition: improved  Diet recommendation: diabetic, thin  Filed Weights   11/06/13 1600  Weight: 78.744 kg (173 lb 9.6 oz)    History of present illness:  Paula [name] is a 79 y.o. female with prior h/o hypertension, dm, COPD, AAA, presents to ED, with left facial droop and slurred speech since this am. Pt appeared very anxious ans reported that she felt left side of the face heavy with pain and tingling and numbness . She also reported that she had difficulty speaking. On arrival to ED, SHE had an MRI brain done which was negative for stroke. Her symptoms improved after arrival to ED. Neurology was consulted and plan was to transfer pt to [facility] for further evaluation of TIA.    Hospital Course:  TIA  MRI been negative for acute stroke. MRA head pending.  2-D echo and carotid Doppler ok.  Lipid panel shows elevated triglycerides- re check 6 weeks  Appreciate neurology recommendations.  Discharge on full dose aspirin   Aphasia and dysphagia  Outpatient SLP  Hypertension  Resume home dose losartan and HCTZ. Continue Lasix   Hypothyroidism  Continue Synthroid   COPD  Continue albuterol inhaler   GERD  Continue PPI  Diabetes mellitus  Continue SSI  Nausea -suspect anxiety -defer to PCP    Procedures: Echo: Study Conclusions  Left ventricle: The cavity size was normal. Wall thickness was normal. Systolic function was normal. The estimated ejection fraction was in the range  of 60% to 65%. Wall motion was normal; there were no regional wall motion abnormalities. There was an increased relative contribution of atrial contraction to ventricular filling.  Carotid: There is 1-39% ICA stenosis. Vertebral artery flow is antegrade    Consultations:  neuro  Discharge Exam: Filed Vitals:   11/08/13 0550  BP: 152/72  Pulse: 82  Temp: 98 F (36.7 C)  Resp: 18    General: A+Ox3, anxious Cardiovascular: rrr Respiratory: clear anterior  Discharge Instructions  Discharge Orders   Future Appointments Provider Department Dept Phone   11/10/2013 9:45 AM Santiago Bumpers, DPM [facility] [phone]   11/24/2013 1:40 PM [facility] [phone]   Please wear two piece clothing and wear no powder or deodorant. Please arrive 15 minutes early prior to your appointment time.   12/01/2013 2:00 PM Levert Feinstein, MD [facility] [phone]   04/05/2014 1:00 PM Melvyn Novas, MD [facility] [phone]   Future Orders Complete By Expires   Diet - low sodium heart healthy  As directed    Discharge instructions  As directed    Comments:     Outpatient PT   Increase activity slowly  As directed        Medication List         acetaminophen 500 MG tablet  Commonly known as:  TYLENOL  Take 500 mg by mouth every 6 (six) hours as needed (pain).     albuterol 108 (90 BASE) MCG/ACT inhaler  Commonly known as:  PROVENTIL HFA;VENTOLIN HFA  Inhale 2 puffs into the lungs every 6 (six) hours as needed for wheezing.     ALPRAZolam 1 MG tablet  Commonly known as:  XANAX  Take 1 mg by mouth 3 (three) times daily as needed for anxiety.     aspirin 325 MG EC tablet  Take 1 tablet (325 mg total) by mouth daily.     baclofen 10 MG tablet  Commonly known as:  LIORESAL  Take 10 mg by mouth 3 (three) times daily.     benzonatate 200 MG capsule  Commonly known as:  TESSALON  Take  200 mg by mouth 3 (three) times daily as needed for cough.     calcium carbonate 600 MG Tabs tablet  Commonly known as:  OS-CAL  Take 600 mg by mouth every other day.     carvedilol 6.25 MG tablet  Commonly known as:  COREG  Take 1 tablet (6.25 mg total) by mouth 2 (two) times daily with a meal.     cetirizine 10 MG tablet  Commonly known as:  ZYRTEC  Take 10 mg by mouth daily as needed for allergies.     furosemide 20 MG tablet  Commonly known as:  LASIX  Take 1 tablet (20 mg total) by mouth daily.     guaiFENesin 600 MG 12 hr tablet  Commonly known as:  MUCINEX  Take 1 tablet (600 mg total) by mouth 2 (two) times daily.     Ipratropium-Albuterol 20-100 MCG/ACT Aers respimat  Commonly known as:  COMBIVENT  Inhale 1 puff into the lungs every 6 (six) hours as needed for wheezing or shortness of breath.     levothyroxine 175 MCG tablet  Commonly known as:  SYNTHROID, LEVOTHROID  Take 1 tablet (175 mcg total) by mouth daily.     losartan-hydrochlorothiazide 50-12.5 MG per tablet  Commonly known as:  HYZAAR  Take 1 tablet by mouth daily.     metFORMIN 500 MG 24 hr tablet  Commonly known as:  GLUCOPHAGE-XR  Take 1 tablet (500 mg total) by mouth daily with breakfast.     mometasone 50 MCG/ACT nasal spray  Commonly known as:  NASONEX  Place 2 sprays into the nose daily.     ondansetron 4 MG tablet  Commonly known as:  ZOFRAN  Take 1 tablet (4 mg total) by mouth every 8 (eight) hours as needed for nausea.     pantoprazole 40 MG tablet  Commonly known as:  PROTONIX  Take 1 tablet by mouth  daily     potassium chloride SA 20 MEQ tablet  Commonly known as:  K-DUR,KLOR-CON  Take 1 tablet (20 mEq total) by mouth daily.     sertraline 50 MG tablet  Commonly known as:  ZOLOFT  Take 1 tablet (50 mg total) by mouth daily.     Vitamin D3 5000 UNITS Tabs  Take 1 tablet by mouth every other day.       Allergies  Allergen Reactions  . Bactrim [Sulfamethoxazole-Tmp Ds]   .  Contrast Media [Iodinated Diagnostic Agents] Anaphylaxis  . Gadolinium Derivatives   . Iohexol Anaphylaxis, Shortness Of Breath and Swelling     Desc: PT STATES SHE HAD A SEVERE REACTION TO IV CONRAST WITH THROAT SWELLING AND SOB. SHE WAS ADMITTED TO THE HOSPITAL. SHE HAS NEVER HAD CONTRAST AGAIN.   Marland Kitchen Penicillins Rash  . Sulfa Antibiotics Anaphylaxis  . Avelox [Moxifloxacin Hcl In Nacl] Rash  . Ciprofloxacin Itching  . Delsym [  Dextromethorphan] Other (See Comments)    Hallucination  . Hydromet [Hydrocodone-Homatropine] Other (See Comments)    Pt states med make her hallucinate       Follow-up Information   Follow up with SETHI,PRAMODKUMAR P, MD In 2 months. (Call for an appointment Monday to be seen in 2 months)    Specialties:  Neurology, Radiology   Contact information:   [address] [place] [address] [phone]       Follow up with Rene Paci, MD In 1 week.   Specialty:  Internal Medicine   Contact information:   [address] [place] [address] [phone]        The results of significant diagnostics from this hospitalization (including imaging, microbiology, ancillary and laboratory) are listed below for reference.    Significant Diagnostic Studies: Ct Abdomen Pelvis Wo Contrast  10/26/2013   CLINICAL DATA:  Chronic nausea.  Follow-up abnormal CT.  EXAM: CT ABDOMEN AND PELVIS WITHOUT CONTRAST  TECHNIQUE: Multidetector CT imaging of the abdomen and pelvis was performed following the standard protocol without intravenous contrast.  COMPARISON:  03/04/2013  FINDINGS: Chronic density at the left lung base could represent rounded atelectasis or scarring. Slight pleural thickening at the left base posteriorly, stable. No pleural effusions. Heart is normal size.  Prior cholecystectomy. Changes of old granulomatous disease with scattered calcifications in the liver and spleen, stable. Again noted is the low-density mass  extending off the distal aspect of the pancreatic body. This measures 4.6 x 3.5 cm compared with 5.5 x 4.8 cm previously, further decreased. Remainder of the pancreas is unremarkable. Adrenals and kidneys have an unremarkable unenhanced appearance. No renal or ureteral stones. No hydronephrosis.  Abdominal aneurysm near the level of the renal arteries measures 3.7 cm maximally, stable. Again noted is minimal omental nodularity to the right of midline on image 46, stable. Urinary bladder, uterus and adnexa have a grossly unremarkable unenhanced appearance. Large and small bowel unremarkable. No free fluid, free air or adenopathy.  Bilateral inguinal hernias containing fat, stable. Prior ventral pelvic wall hernia repair.  No acute bony abnormality. Degenerative changes in the lumbar spine and hips.  IMPRESSION: Slight decrease in size of the hypodense lesion extending off the distal aspect of the pancreatic body. Given the further reduction in size, pseudo cyst remains favored.  Old granulomatous disease in the liver and spleen.  Rounded atelectasis versus scarring in the left lung base. Adjacent pleural thickening.  Stable abdominal aortic aneurysm.  Stable minimal omental nodularity.   Electronically Signed   By: Charlett Nose M.D.   On: 10/26/2013 16:03   Dg Chest 2 View  11/06/2013   CLINICAL DATA:  Stroke.  EXAM: CHEST  2 VIEW  COMPARISON:  08/23/2013  FINDINGS: Mild enlargement of cardiac silhouette. The mediastinum is normal in contour. No hilar masses.  There are increased markings at the lung bases, stable. No convincing infiltrate and no pulmonary edema. No pleural effusion or pneumothorax.  The bony thorax is demineralized but grossly intact.  IMPRESSION: No acute cardiopulmonary disease.   Electronically Signed   By: Amie Portland M.D.   On: 11/06/2013 20:40   Mr Brain Wo Contrast  11/06/2013   CLINICAL DATA:  TIA.  Vision  EXAM: MRI HEAD WITHOUT CONTRAST  TECHNIQUE: Multiplanar, multiecho pulse  sequences of the brain and surrounding structures were obtained without intravenous contrast.  COMPARISON:  CT 02/19/2013.  MRI 01/03/2013, 10/12/2011, 09/18/2011  FINDINGS: Generalized  atrophy. Chronic microvascular ischemic changes in the white matter and pons.  Negative for acute infarct. Chronic micro hemorrhage right frontal white matter unchanged from the prior MRI.  There is a lesion in the right parotid gland measuring approximately 11 mm in diameter. This is unchanged from prior studies going back to 09/18/2011. This may be a parotid mass or lymph node. This could be a pleomorphic adenoma. It is stable. No intracranial mass or edema.  IMPRESSION: Atrophy and chronic microvascular ischemic change. No acute infarct.  Stable right parotid lesion.   Electronically Signed   By: Marlan Palau M.D.   On: 11/06/2013 11:54   Mr Maxine Glenn Head/brain Wo Cm  11/07/2013   CLINICAL DATA:  Stroke  EXAM: MRA HEAD WITHOUT CONTRAST  TECHNIQUE: Angiographic images of the Circle of Willis were obtained using MRA technique without intravenous contrast.  COMPARISON:  MRI head 11/06/2013  FINDINGS: Both vertebral arteries are patent. Posterior inferior cerebellar artery is patent bilaterally. The basilar is widely patent. Superior cerebellar and posterior cerebral arteries are patent bilaterally. Posterior communicating artery is patent bilaterally.  The internal carotid artery is patent bilaterally. Anterior and middle cerebral arteries are patent bilaterally without stenosis.  Negative for cerebral aneurysm.  IMPRESSION: Negative   Electronically Signed   By: Marlan Palau M.D.   On: 11/07/2013 17:54    Microbiology: No results found for this or any previous visit (from the past 240 hour(s)).   Labs: Basic Metabolic Panel:  Recent Labs Lab 11/06/13 0945 11/06/13 1005  NA 140 144  K 3.8 3.5  CL 103 103  CO2 27  --   GLUCOSE 138* 133*  BUN 15 14  CREATININE 0.96 1.10  CALCIUM 9.5  --    Liver Function  Tests:  Recent Labs Lab 11/06/13 0945  AST 17  ALT 16  ALKPHOS 56  BILITOT 0.5  PROT 6.1  ALBUMIN 3.5   No results found for this basename: LIPASE, AMYLASE,  in the last 168 hours No results found for this basename: AMMONIA,  in the last 168 hours CBC:  Recent Labs Lab 11/06/13 0945 11/06/13 1005  WBC 10.8*  --   NEUTROABS 7.6  --   HGB 10.3* 12.2  HCT 31.7* 36.0  MCV 103.9*  --   PLT 248  --    Cardiac Enzymes:  Recent Labs Lab 11/06/13 0945  TROPONINI <0.30   BNP: BNP (last 3 results)  Recent Labs  04/16/13 0211 07/11/13 1000 07/12/13 0305  PROBNP 293.2 697.2* 1369.0*   CBG:  Recent Labs Lab 11/06/13 0930 11/06/13 1635 11/07/13 2201 11/08/13 0808  GLUCAP 136* 110* 149* 146*       Signed:  Riona Lahti  [facility] 11/08/2013, 9:59 AM

## 2013-11-08 NOTE — Telephone Encounter (Signed)
The chest x-ray was clear Brain MRI was normal -no stroke and no aneurysm I will review this further at her next visit with me. Thanks

## 2013-11-08 NOTE — Care Management Note (Signed)
    Page 1 of 1   11/08/2013     11:42:16 AM   CARE MANAGEMENT NOTE 11/08/2013  Patient:  Paula [name], Paula [name]   Account Number:  [id number]  Date Initiated:  11/08/2013  Documentation initiated by:  Elmer Bales  Subjective/Objective Assessment:   Patient admitted with TIA. Lives at home with husband     Action/Plan:   Will follow for discharge needs.   Anticipated DC Date:  11/08/2013   Anticipated DC Plan:  HOME/SELF CARE      DC Planning Services  CM consult      Choice offered to / List presented to:  C-3 Spouse           Status of service:  Completed, signed off Medicare Important Message given?   (If response is "NO", the following Medicare IM given date fields will be blank) Date Medicare IM given:   Date Additional Medicare IM given:    Discharge Disposition:  HOME/SELF CARE  Per UR Regulation:    If discussed at Long Length of Stay Meetings, dates discussed:    Comments:  11/08/13 1115 Elmer Bales RN, MSN, CM- Met with patient and husband to discuss outpatient PT. Patient would prefer to go to [facility] on [address], where she has gone in the past.  CM spoke with Annasha at [phone].  Requested information was faxed to [phone].

## 2013-11-08 NOTE — Telephone Encounter (Signed)
Pt called requesting Xray results from hospital visit on 12.27.14.  States she did not get results prior to discharge.  Please advise

## 2013-11-08 NOTE — Evaluation (Signed)
Clinical/Bedside Swallow Evaluation Patient Details  Name: Paula [name] MRN: [id number] Date of Birth: January 29, 1927  Today's Date: 11/08/2013 Time: 1200-1230 SLP Time Calculation (min): 30 min  Past Medical History:  Past Medical History  Diagnosis Date  . Depression   . Ejection fraction     EF 40%, echo, November, 2012  / in improved, EF 60%, echo, December, 2012  . Contrast media allergy     Patient feels poorly with contrast  . Status post AAA (abdominal aortic aneurysm) repair     Surgical repair, Dr. Hart Rochester, December 27, 2011  . Pre-syncope     vasovagal w/ heart block  . Parotid mass     has refused further eval 10/2011  . GERD (gastroesophageal reflux disease)   . Anxiety   . Facial tic     L sided spasms - Botox trial summer 2013, ?effective  . Macular degeneration   . AAA (abdominal aortic aneurysm)     s/p repair 12/2011  . Congestive heart failure     NL EF 10/2011 echo   . Diabetes mellitus type II, controlled   . Hypothyroidism   . Sinus bradycardia 09/19/2011    Occurring simultaneously with complete heart block   . Hypertension   . COPD (chronic obstructive pulmonary disease)   . Abdominal spasms     hemifacial-treated w/ botox xeomin -Dr Terrace Arabia   Past Surgical History:  Past Surgical History  Procedure Laterality Date  . Cholecystectomy    . Knee cartilage surgery      left  . Sinus surgery with instatrak    . Appendectomy    . Oophorectomy      ovarian cyst  . Cataract extraction      bilateral  . Bladder repair    . US echocardiography  10/14/11  . Abdominal aortic aneurysm repair  12/27/2011    Procedure: ANEURYSM ABDOMINAL AORTIC REPAIR;  Surgeon: Josephina Gip, MD;  Location: [facility] OR;  Service: Vascular;  Laterality: N/A;  Resection and Grafting Abdominal Aortic Aneurysm , Aorta Bi Iliac.  Marland Kitchen Pacemaker insertion  11/12  . Cardiac catheterization  11/04/11   HPI:  Paula [name] is an 79 y.o. female with a history of diabetes mellitus, hypertension,  COPD, hypothyroidism and equivocal TIA 2 years ago, presenting with new onset left facial droop and paresthesias, along with slurred speech. Onset was 6 AM 11/06/2013. Patient had been taking aspirin 81 mg per day. MRI of her brain showed no signs of acute stroke. Facial drooping had markedly improved, as had facial paresthesias. Dysarthria resolved.   Assessment / Plan / Recommendation Clinical Impression  Pt verbalizes occasional globus; points to her throat saying she sometimes feels soemthing there, or the sensation that it is difficult to initiate a swallow. Functionally, pt is WNL in consumption of solids and liquids. Discussed esopahgeal strategies and possibility that GER or an esophageal issue could contribute to symptoms. Encouraged her to discuss with her primary care doctor as well as following basic compensatory strategies. No SLP f/u needed for swallowing and pt is safe to continue regular diet.     Aspiration Risk  Mild    Diet Recommendation Regular;Thin liquid   Liquid Administration via: Cup;Straw Medication Administration: Whole meds with liquid Supervision: Patient able to self feed Compensations: Follow solids with liquid Postural Changes and/or Swallow Maneuvers: Seated upright 90 degrees;Upright 30-60 min after meal    Other  Recommendations     Follow Up Recommendations  None    Frequency  and Duration        Pertinent Vitals/Pain NA    SLP Swallow Goals     Swallow Study Prior Functional Status       General HPI: [name] is an 79 y.o. female with a history of diabetes mellitus, hypertension, COPD, hypothyroidism and equivocal TIA 2 years ago, presenting with new onset left facial droop and paresthesias, along with slurred speech. Onset was 6 AM 11/06/2013. Patient had been taking aspirin 81 mg per day. MRI of her brain showed no signs of acute stroke. Facial drooping had markedly improved, as had facial paresthesias. Dysarthria resolved. Type of  Study: Bedside swallow evaluation Diet Prior to this Study: Regular;Thin liquids Temperature Spikes Noted: No Respiratory Status: Room air History of Recent Intubation: No Behavior/Cognition: Alert;Cooperative;Pleasant mood Oral Cavity - Dentition: Adequate natural dentition Self-Feeding Abilities: Able to feed self Patient Positioning: Upright in bed Baseline Vocal Quality: Low vocal intensity Volitional Cough: Strong Volitional Swallow: Able to elicit    Oral/Motor/Sensory Function Overall Oral Motor/Sensory Function: Impaired at baseline Labial ROM: Within Functional Limits Labial Symmetry: Abnormal symmetry left Labial Strength: Within Functional Limits Labial Sensation: Within Functional Limits Lingual ROM: Within Functional Limits Lingual Symmetry: Within Functional Limits Lingual Strength: Reduced Lingual Sensation: Within Functional Limits Facial ROM: Within Functional Limits Facial Symmetry: Within Functional Limits Facial Strength: Within Functional Limits Facial Sensation: Within Functional Limits Velum: Within Functional Limits Mandible: Within Functional Limits   Ice Chips Ice chips: Not tested   Thin Liquid Thin Liquid: Within functional limits    Nectar Thick Nectar Thick Liquid: Not tested   Honey Thick Honey Thick Liquid: Not tested   Puree Puree: Within functional limits   Solid   GO    Solid: Within functional limits Presentation: Self Floyce Stakes, MA CCC-SLP [phone]  Bairon Klemann, Riley Nearing 11/08/2013,1:56 PM

## 2013-11-08 NOTE — Progress Notes (Signed)
UR complete.  Cozetta Seif RN, MSN 

## 2013-11-09 ENCOUNTER — Ambulatory Visit: Payer: Medicare Other | Admitting: Nurse Practitioner

## 2013-11-09 NOTE — Telephone Encounter (Signed)
Spoke with pts husband advised of MDs message 

## 2013-11-10 ENCOUNTER — Ambulatory Visit (INDEPENDENT_AMBULATORY_CARE_PROVIDER_SITE_OTHER): Payer: Medicare Other | Admitting: Podiatry

## 2013-11-10 ENCOUNTER — Encounter: Payer: Self-pay | Admitting: Podiatry

## 2013-11-10 ENCOUNTER — Telehealth: Payer: Self-pay | Admitting: *Deleted

## 2013-11-10 VITALS — BP 113/59 | HR 77 | Resp 20 | Ht 65.0 in | Wt 170.0 lb

## 2013-11-10 DIAGNOSIS — M79609 Pain in unspecified limb: Secondary | ICD-10-CM

## 2013-11-10 DIAGNOSIS — B351 Tinea unguium: Secondary | ICD-10-CM

## 2013-11-10 NOTE — Progress Notes (Signed)
Patient ID: Paula [name], female   DOB: 06/21/1927, 79 y.o.   MRN: [id number]  Subjective: Patient presents complaining of painful toenails. She has been a patient in this office for this problem since 2013 presents at approximately three-month intervals. Her last visit for this service was 08/02/2013.  Objective: Orientated x3 female. Brittle, hypertrophic, discolored toenails x10 with palpable tenderness in the nails.  Assessment: Symptomatic onychomycoses x10  Plan: Debridement of toenails x10 without a bleeding.Reappoint intervals at 3 months.

## 2013-11-10 NOTE — Telephone Encounter (Signed)
[facility] Triage Call Report Triage Record Num: [id number] Operator: April Finney Patient Name: Paula [name] Call Date & Time: 11/09/2013 9:58:05PM Patient Phone: [phone] PCP: Rene Paci Patient Gender: Female PCP Fax : [phone] Patient DOB: 05-15-1927 Practice Name: [facility] Reason for Call: Caller: [name]; PCP: Rene Paci (Adults only); CB#: [phone]; Call regarding Medication question; Wants to know if she can take an extra Baclofen due to muscle spasm on side of face. No paralysis or weakness. No other symptoms. Instructed her to follow her doctors orders with [facility] which is 3 times a day and she has already taken it 3 times today. Triage completed and no emergent symptoms. Only change in medication was Aspirin was increased to 325mg . See in 24 hrs care advice given per neurological deficits for symptoms bgan after starting or changing dose of prescription, nonprescription alternative medication or illicit drug. Protocol(s) Used: Neurological Deficits Recommended Outcome per Protocol: See Provider within 24 hours Reason for Outcome: Symptoms began after starting or changing dose of prescription, nonprescription, alternative medication, or illicit drug Care Advice: ~ Call provider immediately if symptoms do not improve or worsen. ~ SYMPTOM / CONDITION MANAGEMENT ~ CAUTIONS

## 2013-11-11 ENCOUNTER — Other Ambulatory Visit: Payer: Self-pay | Admitting: Internal Medicine

## 2013-11-12 ENCOUNTER — Ambulatory Visit: Payer: Medicare Other | Admitting: Internal Medicine

## 2013-11-12 ENCOUNTER — Telehealth: Payer: Self-pay

## 2013-11-12 MED ORDER — ONDANSETRON HCL 4 MG PO TABS: 4.0000 mg | ORAL_TABLET | Freq: Three times a day (TID) | ORAL | Status: DC | PRN

## 2013-11-12 NOTE — Telephone Encounter (Signed)
Refill sent to mail order. 

## 2013-11-15 ENCOUNTER — Ambulatory Visit (INDEPENDENT_AMBULATORY_CARE_PROVIDER_SITE_OTHER): Payer: Medicare Other | Admitting: Internal Medicine

## 2013-11-15 ENCOUNTER — Encounter: Payer: Self-pay | Admitting: Internal Medicine

## 2013-11-15 VITALS — BP 124/62 | HR 82 | Temp 99.4°F | Wt 173.8 lb

## 2013-11-15 DIAGNOSIS — G514 Facial myokymia: Secondary | ICD-10-CM

## 2013-11-15 DIAGNOSIS — G459 Transient cerebral ischemic attack, unspecified: Secondary | ICD-10-CM

## 2013-11-15 DIAGNOSIS — F411 Generalized anxiety disorder: Secondary | ICD-10-CM

## 2013-11-15 DIAGNOSIS — F419 Anxiety disorder, unspecified: Secondary | ICD-10-CM

## 2013-11-15 DIAGNOSIS — G518 Other disorders of facial nerve: Secondary | ICD-10-CM

## 2013-11-15 NOTE — Patient Instructions (Signed)
It was good to see you today.  We have reviewed your prior records including labs and tests today  continue baclofen upto 3 times per day for face spasm  Your prescription(s) have been submitted to your pharmacy. Please take as directed and contact our office if you believe you are having problem(s) with the medication(s).  Other Medications reviewed and updated, no other changes recommended at this time.

## 2013-11-15 NOTE — Assessment & Plan Note (Signed)
Hospitalization in December 2014 for possible TIA symptoms reviewed Continue ongoing medical management for secondary prevention Followup with neuro as planned

## 2013-11-15 NOTE — Assessment & Plan Note (Signed)
eval and tx ongoing by neuro - but pt ? benefit of botox injections [id number] -  hosp for "severe" spasm L face 10/2013 - no CVA, ?TIA Advised follow up for repeat injection as needed (neuro feels psychogenic) Continue baclofen as ongoing - increased dose 10/24/13

## 2013-11-15 NOTE — Progress Notes (Signed)
Pre-visit discussion using our clinic review tool. No additional management support is needed unless otherwise documented below in the visit note.  

## 2013-11-15 NOTE — Progress Notes (Signed)
Subjective:    Patient ID: Paula LarsenSusan R Steadman, female    DOB: 1927-06-25, 80 y.o.   MRN: [id number]  HPI  Here for followup -hospitalized for left facial spasm, ?TIA Overnight hospitalization and evaluation/imaging reviewed Reports continued twitching, back to baseline  Past Medical History  Diagnosis Date  . Depression   . Ejection fraction     EF 40%, echo, November, 2012  / in improved, EF 60%, echo, December, 2012  . Contrast media allergy     Patient feels poorly with contrast  . Status post AAA (abdominal aortic aneurysm) repair     Surgical repair, Dr. Hart RochesterLawson, December 27, 2011  . Pre-syncope     vasovagal w/ heart block  . Parotid mass     has refused further eval 10/2011  . GERD (gastroesophageal reflux disease)   . Anxiety   . Facial tic     L sided spasms - Botox trial summer 2013, ?effective  . Macular degeneration   . AAA (abdominal aortic aneurysm)     s/p repair 12/2011  . Congestive heart failure     NL EF 10/2011 echo   . Diabetes mellitus type II, controlled   . Hypothyroidism   . Sinus bradycardia 09/19/2011    Occurring simultaneously with complete heart block   . Hypertension   . COPD (chronic obstructive pulmonary disease)   . Abdominal spasms     hemifacial-treated w/ botox xeomin -Dr Terrace ArabiaYan    Review of Systems  Constitutional: Positive for fatigue (chronic). Negative for fever, activity change, appetite change and unexpected weight change.  Respiratory: Positive for cough (chronic). Negative for chest tightness and shortness of breath.   Cardiovascular: Negative for chest pain, palpitations and leg swelling.  Neurological: Negative for dizziness, syncope and headaches.       Objective:   Physical Exam BP 124/62  Pulse 82  Temp(Src) 99.4 F (37.4 C) (Oral)  Wt 173 lb 12.8 oz (78.835 kg)  SpO2 91% Wt Readings from Last 3 Encounters:  11/15/13 173 lb 12.8 oz (78.835 kg)  11/10/13 170 lb (77.111 kg)  11/06/13 173 lb 9.6 oz (78.744 kg)    Constitutional: She appears well-developed and well-nourished. No distress. Spouse at side  Neck: Normal range of motion. Neck supple. No JVD present. No thyromegaly present.  Cardiovascular: Normal rate, regular rhythm and normal heart sounds.  No murmur heard. No BLE edema. Pulmonary/Chest: Effort normal and breath sounds normal. No respiratory distress. She has no wheezes.  Neurological: She is alert and oriented to person, place, and time. No cranial nerve deficit. Coordination, balance, strength, speech and gait are normal.  Skin: Skin is warm and dry. No rash noted. No erythema.  Psychiatric: She has a perseverating and dysphoric/mildly anxious mood and affect. Her behavior is normal. Judgment and thought minimally impaired.   Lab Results  Component Value Date   WBC 10.8* 11/06/2013   HGB 12.2 11/06/2013   HCT 36.0 11/06/2013   PLT 248 11/06/2013   GLUCOSE 133* 11/06/2013   CHOL 153 11/07/2013   TRIG 282* 11/07/2013   HDL 28* 11/07/2013   LDLDIRECT 90.6 02/10/2013   LDLCALC 69 11/07/2013   ALT 16 11/06/2013   AST 17 11/06/2013   NA 144 11/06/2013   K 3.5 11/06/2013   CL 103 11/06/2013   CREATININE 1.10 11/06/2013   BUN 14 11/06/2013   CO2 27 11/06/2013   TSH 0.359 08/22/2013   INR 1.03 11/06/2013   HGBA1C 7.3* 11/07/2013  Assessment & Plan:  See problem list. Medications and labs reviewed today.  Time spent with pt/family today 25 minutes, greater than 50% time spent counseling patient on hospitalization for left facial spasm, possible TIA, anxiety and medication review. Also review of prior records

## 2013-11-15 NOTE — Assessment & Plan Note (Signed)
Significant chronic anxiety (spouse and pt), likely contributing to chronic "unwell" symptoms Review and reassurance provided to patient and spouse today Continue same sertraline  Last increased BZ to TID 02/2013 S/p counseling with gutterman 02/2013 x 1 OV, to follow up prn

## 2013-11-16 ENCOUNTER — Telehealth: Payer: Self-pay | Admitting: Neurology

## 2013-11-16 NOTE — Telephone Encounter (Signed)
Pt's husband called.  Stated PT had been to hospital last week.  She showed all signs of a stroke, face slack on left side and slurred speech.  Per hospital testing, husband said that they showed she didn't have a stroke.  PT was seen by her PCP on 11/15/2013 and was advised to call us to speak with doctor regarding their concerns.  She is also having headaches.  Please call back.

## 2013-11-16 NOTE — Telephone Encounter (Signed)
Patient of Dr Zannie CoveYan's and me  with  extensive white matter disease, hemifacial spasms. Facial droop may be related to Botox if recently given? Make follow up appointment in the next week with dr Terrace ArabiaYan , please.

## 2013-11-16 NOTE — Telephone Encounter (Signed)
Called to get questions/concerns for the doctor, lt VM message for return call back

## 2013-11-16 NOTE — Telephone Encounter (Signed)
Noted and agree No urgency to concerns of last week, complete eval for same in hospital reviewed by me at OV yesterday (1/5)

## 2013-11-17 ENCOUNTER — Other Ambulatory Visit: Payer: Self-pay | Admitting: Internal Medicine

## 2013-11-17 NOTE — Telephone Encounter (Signed)
This encounter was created in error - please disregard.

## 2013-11-19 ENCOUNTER — Telehealth: Payer: Self-pay

## 2013-11-19 NOTE — Telephone Encounter (Signed)
Called patient husband x two left two messages stating Dr.Yan would be glad to address all concerns at xeomin  Apt. Please call the office back if he needed anything else.  Apt. With Dr.Yan 12-01-2013.

## 2013-11-22 ENCOUNTER — Ambulatory Visit (INDEPENDENT_AMBULATORY_CARE_PROVIDER_SITE_OTHER)
Admission: RE | Admit: 2013-11-22 | Discharge: 2013-11-22 | Disposition: A | Discharge status: Home / Self Care | Payer: Medicare Other | Source: Ambulatory Visit | Attending: Family Medicine | Admitting: Family Medicine

## 2013-11-22 ENCOUNTER — Ambulatory Visit (INDEPENDENT_AMBULATORY_CARE_PROVIDER_SITE_OTHER): Payer: Medicare Other | Admitting: Family Medicine

## 2013-11-22 ENCOUNTER — Encounter: Payer: Self-pay | Admitting: Family Medicine

## 2013-11-22 ENCOUNTER — Ambulatory Visit: Payer: Medicare Other | Admitting: Internal Medicine

## 2013-11-22 VITALS — BP 136/70 | HR 90 | Temp 99.9°F | Resp 16 | Wt 177.0 lb

## 2013-11-22 DIAGNOSIS — R05 Cough: Secondary | ICD-10-CM

## 2013-11-22 DIAGNOSIS — R059 Cough, unspecified: Secondary | ICD-10-CM

## 2013-11-22 MED ORDER — LEVOFLOXACIN 500 MG PO TABS: 500.0000 mg | ORAL_TABLET | Freq: Every day | ORAL | Status: DC

## 2013-11-22 NOTE — Patient Instructions (Signed)
Very nice to meet you Continue your protonix for your stomach, I think this could help I think this could be from aspirating so would change diet to avoid spicy food or too much thin liquids.  Try new medication called levaquin. It is like the avelox but hopefully will not give you a reaction.  See Dr. Felicity CoyerLeschber next week. We need to reschedule your swallow study at that time.    Aspiration Pneumonia Aspiration pneumonia is an infection in your lungs. It occurs when food, liquid, or stomach contents (vomit) are inhaled (aspirated) into your lungs. When these things get into your lungs, swelling (inflammation) and infection can occur. This can make it difficult for you to breathe. Aspiration pneumonia is a serious condition and can be life threatening. RISK FACTORS Aspiration pneumonia is more likely to occur when a person's cough (gag) reflex or ability to swallow has been decreased. Some things that can do this include:   Having a brain injury or disease, such as stroke, seizures, Parkinson disease, dementia, or amyotrophic lateral sclerosis (ALS).   Being given general anesthetic for procedures.   Being in a coma (unconscious).   Having a narrowing of the tube that carries food to the stomach (esophagus).   Drinking too much alcohol. If a person passes out and vomits, vomit can be swallowed into the lungs.   Taking certain medicines, such as tranquilizers or sedatives.  SIGNS AND SYMPTOMS   Coughing after swallowing food or liquids.   Breathing problems, such as wheezing or shortness of breath.   Bluish skin. This can be caused by lack of oxygen.   Coughing up food or mucus. The mucus might contain blood, greenish material, or yellowish-white fluid (pus).   Fever.   Chest pain.   Being more tired than usual (fatigue).   Sweating more than usual.   Bad breath.  DIAGNOSIS  A physical exam will be done. During the exam, the health care provider will listen to  your lungs with a stethoscope to check for:   Crackling sounds in the lungs.  Decreased breath sounds.  A rapid heartbeat. Various tests may be ordered. These may include:   Chest X-ray.   CT scan.   Swallowing study. This test looks at how food is swallowed and whether it goes into your breathing tube (trachea) or food pipe (esophagus).   Sputum culture. Saliva and mucus (sputum) are collected from the lungs or the tubes that carry air to the lungs (bronchi). The sputum is then tested for bacteria.   Bronchoscopy. This test uses a flexible tube (bronchoscope) to see inside the lungs. TREATMENT  Treatment will usually include antibiotic medicines. Other medicines may also be used to reduce fever or pain. You may need to be treated in the hospital. In the hospital, your breathing will be carefully monitored. Depending on how well you are breathing, you may need to be given oxygen, or you may need breathing support from a breathing machine (ventilator). For people who fail a swallowing study, a feeding tube might be placed in the stomach, or they may be asked to avoid certain food textures or liquids when they eat. HOME CARE INSTRUCTIONS   Carefully follow any special eating instructions you were given, such as avoiding certain food textures or thickening liquids. This reduces the risk of developing aspiration pneumonia again.  Only take over-the-counter or prescription medicines as directed by your health care provider. Follow the directions carefully.   If you were prescribed antibiotics, take them  as directed. Finish them even if you start to feel better.   Rest as instructed by your health care provider.   Keep all follow-up appointments with your health care provider.  SEEK MEDICAL CARE IF:   You develop worsening shortness of breath, wheezing, or difficulty breathing.   You develop a fever.   You have chest pain.  MAKE SURE YOU:   Understand these  instructions.  Will watch your condition.  Will get help right away if you are not doing well or get worse. Document Released: 08/25/2009 Document Revised: 06/30/2013 Document Reviewed: 04/15/2013 [facility] Patient Information 2014 [place], [place].

## 2013-11-22 NOTE — Progress Notes (Signed)
Subjective:    Patient ID: Paula [name], female    DOB: 12/29/1926, 80 y.o.   MRN: [id number]  URI  This is a recurrent problem. The current episode started in the past 7 days. The problem has been unchanged. The maximum temperature recorded prior to her arrival was 100 - 100.9 F. The fever has been present for 3 to 4 days. Associated symptoms include congestion, coughing and nausea. Pertinent negatives include no abdominal pain, chest pain, diarrhea, dysuria, headaches, neck pain, rash, rhinorrhea, sinus pain, sneezing, sore throat, swollen glands, vomiting or wheezing. She has tried increased fluids and sleep for the symptoms. The treatment provided mild relief.   patient was seen in urgent care previously and this weekend and was given doxycycline. Patient states that she has not made any significant improvement. Continues to have a cough and feeling ill. Patient states she has not felt like herself for over 2 years.  Past Medical History  Diagnosis Date  . Depression   . Ejection fraction     EF 40%, echo, November, 2012  / in improved, EF 60%, echo, December, 2012  . Contrast media allergy     Patient feels poorly with contrast  . Status post AAA (abdominal aortic aneurysm) repair     Surgical repair, Dr. Hart Rochester, December 27, 2011  . Pre-syncope     vasovagal w/ heart block  . Parotid mass     has refused further eval 10/2011  . GERD (gastroesophageal reflux disease)   . Anxiety   . Facial tic     L sided spasms - Botox trial summer 2013, ?effective  . Macular degeneration   . AAA (abdominal aortic aneurysm)     s/p repair 12/2011  . Congestive heart failure     NL EF 10/2011 echo   . Diabetes mellitus type II, controlled   . Hypothyroidism   . Sinus bradycardia 09/19/2011    Occurring simultaneously with complete heart block   . Hypertension   . COPD (chronic obstructive pulmonary disease)   . Abdominal spasms     hemifacial-treated w/ botox xeomin -Dr Terrace Arabia    Review of  Systems  Constitutional: Positive for fatigue. Negative for fever and unexpected weight change.  HENT: Positive for congestion. Negative for rhinorrhea, sneezing and sore throat.   Respiratory: Positive for cough. Negative for chest tightness, shortness of breath and wheezing.   Cardiovascular: Negative for chest pain and leg swelling.  Gastrointestinal: Positive for nausea. Negative for vomiting, abdominal pain, diarrhea, constipation and abdominal distention.  Genitourinary: Negative for dysuria.  Musculoskeletal: Negative for neck pain.  Skin: Negative for rash.  Neurological: Negative for headaches.       Objective:   Physical Exam BP 136/70  Pulse 90  Temp(Src) 99.9 F (37.7 C) (Oral)  Resp 16  Wt 177 lb (80.287 kg)  SpO2 98% Wt Readings from Last 3 Encounters:  11/22/13 177 lb (80.287 kg)  11/15/13 173 lb 12.8 oz (78.835 kg)  11/10/13 170 lb (77.111 kg)   Constitutional: She appears well-developed and well-nourished. nontoxic. No distress. spouse at side. HENT: NCAT, no sinus tenderness - OP with clear PND, no erythema or exudate Neck: Normal range of motion. Neck supple. No JVD present. No thyromegaly present.  Cardiovascular: Normal rate, regular rhythm and normal heart sounds.  No murmur heard. No BLE edema. Pulmonary/Chest: Effort normal and breath sounds normal. No respiratory distress. She has no wheezes.  no significant focal findings Psychiatric: She has a normal mood and  affect. Her behavior is normal. Judgment and thought content normal.      Assessment & Plan:   Cough - chronic, I think this is likely secondary to aspiration pneumonia with patient's history of transient ischemic attacks. I do not feel that he would cause me to change my management today. I will try patient on Levaquin even though she has had potential allergy to moxifloxacin before. This seems to be mild in nature. Levaquin would have better coverage for potential aspiration. Another possibility  would be clindamycin but did not want to risk patient getting C Dif infection. We will have patient followup with primary care provider in the next week. I do agree with primary care provider that A. aspiration study, swallow study should be done. It appeared that patient did have one scheduled for December 28 that was canceled.  Pt/spouse will call if symptoms worse or unimproved with rx'd care

## 2013-11-23 ENCOUNTER — Other Ambulatory Visit: Payer: Self-pay | Admitting: Internal Medicine

## 2013-11-24 ENCOUNTER — Emergency Department (HOSPITAL_COMMUNITY)
Admission: EM | Admit: 2013-11-24 | Discharge: 2013-11-24 | Disposition: A | Discharge status: Home / Self Care | Payer: Medicare Other | Attending: Emergency Medicine | Admitting: Emergency Medicine

## 2013-11-24 ENCOUNTER — Emergency Department (HOSPITAL_COMMUNITY): Payer: Medicare Other

## 2013-11-24 ENCOUNTER — Encounter (HOSPITAL_COMMUNITY): Payer: Self-pay | Admitting: Emergency Medicine

## 2013-11-24 ENCOUNTER — Ambulatory Visit: Payer: Medicare Other

## 2013-11-24 DIAGNOSIS — F329 Major depressive disorder, single episode, unspecified: Secondary | ICD-10-CM | POA: Insufficient documentation

## 2013-11-24 DIAGNOSIS — Z87891 Personal history of nicotine dependence: Secondary | ICD-10-CM | POA: Insufficient documentation

## 2013-11-24 DIAGNOSIS — Z792 Long term (current) use of antibiotics: Secondary | ICD-10-CM | POA: Insufficient documentation

## 2013-11-24 DIAGNOSIS — Z8669 Personal history of other diseases of the nervous system and sense organs: Secondary | ICD-10-CM | POA: Insufficient documentation

## 2013-11-24 DIAGNOSIS — R5381 Other malaise: Secondary | ICD-10-CM | POA: Insufficient documentation

## 2013-11-24 DIAGNOSIS — J209 Acute bronchitis, unspecified: Secondary | ICD-10-CM | POA: Insufficient documentation

## 2013-11-24 DIAGNOSIS — Z880 Allergy status to penicillin: Secondary | ICD-10-CM | POA: Insufficient documentation

## 2013-11-24 DIAGNOSIS — Z950 Presence of cardiac pacemaker: Secondary | ICD-10-CM | POA: Insufficient documentation

## 2013-11-24 DIAGNOSIS — J449 Chronic obstructive pulmonary disease, unspecified: Secondary | ICD-10-CM | POA: Insufficient documentation

## 2013-11-24 DIAGNOSIS — Z9089 Acquired absence of other organs: Secondary | ICD-10-CM | POA: Insufficient documentation

## 2013-11-24 DIAGNOSIS — I509 Heart failure, unspecified: Secondary | ICD-10-CM | POA: Insufficient documentation

## 2013-11-24 DIAGNOSIS — F411 Generalized anxiety disorder: Secondary | ICD-10-CM | POA: Insufficient documentation

## 2013-11-24 DIAGNOSIS — F3289 Other specified depressive episodes: Secondary | ICD-10-CM | POA: Insufficient documentation

## 2013-11-24 DIAGNOSIS — Z9889 Other specified postprocedural states: Secondary | ICD-10-CM | POA: Insufficient documentation

## 2013-11-24 DIAGNOSIS — E119 Type 2 diabetes mellitus without complications: Secondary | ICD-10-CM | POA: Insufficient documentation

## 2013-11-24 DIAGNOSIS — Z7982 Long term (current) use of aspirin: Secondary | ICD-10-CM | POA: Insufficient documentation

## 2013-11-24 DIAGNOSIS — I10 Essential (primary) hypertension: Secondary | ICD-10-CM | POA: Insufficient documentation

## 2013-11-24 DIAGNOSIS — R110 Nausea: Secondary | ICD-10-CM | POA: Insufficient documentation

## 2013-11-24 DIAGNOSIS — R5383 Other fatigue: Secondary | ICD-10-CM

## 2013-11-24 DIAGNOSIS — J189 Pneumonia, unspecified organism: Secondary | ICD-10-CM | POA: Insufficient documentation

## 2013-11-24 DIAGNOSIS — IMO0002 Reserved for concepts with insufficient information to code with codable children: Secondary | ICD-10-CM | POA: Insufficient documentation

## 2013-11-24 DIAGNOSIS — J40 Bronchitis, not specified as acute or chronic: Secondary | ICD-10-CM

## 2013-11-24 DIAGNOSIS — K219 Gastro-esophageal reflux disease without esophagitis: Secondary | ICD-10-CM | POA: Insufficient documentation

## 2013-11-24 DIAGNOSIS — E039 Hypothyroidism, unspecified: Secondary | ICD-10-CM | POA: Insufficient documentation

## 2013-11-24 DIAGNOSIS — J4489 Other specified chronic obstructive pulmonary disease: Secondary | ICD-10-CM | POA: Insufficient documentation

## 2013-11-24 DIAGNOSIS — Z79899 Other long term (current) drug therapy: Secondary | ICD-10-CM | POA: Insufficient documentation

## 2013-11-24 LAB — POCT I-STAT, CHEM 8
BUN: 22 mg/dL (ref 6–23)
Calcium, Ion: 1.26 mmol/L (ref 1.13–1.30)
Chloride: 103 mEq/L (ref 96–112)
Creatinine, Ser: 1.1 mg/dL (ref 0.50–1.10)
Glucose, Bld: 137 mg/dL — ABNORMAL HIGH (ref 70–99)
HCT: 36 % (ref 36.0–46.0)
HEMOGLOBIN: 12.2 g/dL (ref 12.0–15.0)
Potassium: 4.3 mEq/L (ref 3.7–5.3)
SODIUM: 143 meq/L (ref 137–147)
TCO2: 30 mmol/L (ref 0–100)

## 2013-11-24 LAB — CBC WITH DIFFERENTIAL/PLATELET
BASOS PCT: 1 % (ref 0–1)
Basophils Absolute: 0.1 10*3/uL (ref 0.0–0.1)
EOS ABS: 0.5 10*3/uL (ref 0.0–0.7)
Eosinophils Relative: 5 % (ref 0–5)
HCT: 34.5 % — ABNORMAL LOW (ref 36.0–46.0)
Hemoglobin: 11.3 g/dL — ABNORMAL LOW (ref 12.0–15.0)
LYMPHS ABS: 2.2 10*3/uL (ref 0.7–4.0)
Lymphocytes Relative: 22 % (ref 12–46)
MCH: 34.1 pg — AB (ref 26.0–34.0)
MCHC: 32.8 g/dL (ref 30.0–36.0)
MCV: 104.2 fL — ABNORMAL HIGH (ref 78.0–100.0)
Monocytes Absolute: 0.8 10*3/uL (ref 0.1–1.0)
Monocytes Relative: 8 % (ref 3–12)
NEUTROS PCT: 64 % (ref 43–77)
Neutro Abs: 6.5 10*3/uL (ref 1.7–7.7)
PLATELETS: 291 10*3/uL (ref 150–400)
RBC: 3.31 MIL/uL — AB (ref 3.87–5.11)
RDW: 15 % (ref 11.5–15.5)
WBC: 10.1 10*3/uL (ref 4.0–10.5)

## 2013-11-24 NOTE — ED Provider Notes (Signed)
CSN: [id number]     Arrival date & time 11/24/13  1128 History   First MD Initiated Contact with Patient 11/24/13 1132     Chief Complaint  Patient presents with  . Pneumonia    known DX last Saturday at Urgent Care   (Consider location/radiation/quality/duration/timing/severity/associated sxs/prior Treatment) HPI Comments: Patient is here because she is concerned about pneumonia. She states for the last week she's had a nonproductive cough and has just felt lousy. She was seen on Saturday urgent care and told she had pneumonia and started on an unknown antibiotic. She saw her doctor 2 days ago and was told then that she had pneumonia and was given Levaquin which she has been taking daily but states today she just feels lousy. Continues to have an occasional nonproductive cough but denies shortness of breath, chest pain, abdominal pain, vomiting or diarrhea but does have some nausea. She does not use inhalers regularly but does have an albuterol inhaler as needed. She denies fever and is here just to get checked out.  The history is provided by the patient.    Past Medical History  Diagnosis Date  . Depression   . Ejection fraction     EF 40%, echo, November, 2012  / in improved, EF 60%, echo, December, 2012  . Contrast media allergy     Patient feels poorly with contrast  . Status post AAA (abdominal aortic aneurysm) repair     Surgical repair, Dr. Hart Rochester, December 27, 2011  . Pre-syncope     vasovagal w/ heart block  . Parotid mass     has refused further eval 10/2011  . GERD (gastroesophageal reflux disease)   . Anxiety   . Facial tic     L sided spasms - Botox trial summer 2013, ?effective  . Macular degeneration   . AAA (abdominal aortic aneurysm)     s/p repair 12/2011  . Congestive heart failure     NL EF 10/2011 echo   . Diabetes mellitus type II, controlled   . Hypothyroidism   . Sinus bradycardia 09/19/2011    Occurring simultaneously with complete heart block   .  Hypertension   . COPD (chronic obstructive pulmonary disease)   . Abdominal spasms     hemifacial-treated w/ botox xeomin -Dr Terrace Arabia  . Pneumonia    Past Surgical History  Procedure Laterality Date  . Cholecystectomy    . Knee cartilage surgery      left  . Sinus surgery with instatrak    . Appendectomy    . Oophorectomy      ovarian cyst  . Cataract extraction      bilateral  . Bladder repair    . US echocardiography  10/14/11  . Abdominal aortic aneurysm repair  12/27/2011    Procedure: ANEURYSM ABDOMINAL AORTIC REPAIR;  Surgeon: Josephina Gip, MD;  Location: [facility] OR;  Service: Vascular;  Laterality: N/A;  Resection and Grafting Abdominal Aortic Aneurysm , Aorta Bi Iliac.  Marland Kitchen Pacemaker insertion  11/12  . Cardiac catheterization  11/04/11   Family History  Problem Relation Age of Onset  . Esophageal cancer Father   . Cancer Father   . Breast cancer Sister   . Cancer Sister   . Colon cancer Sister   . Heart disease Sister   . Heart disease Mother   . Heart disease Son    History  Substance Use Topics  . Smoking status: Former Smoker -- 1.00 packs/day for 30 years  Types: Cigarettes    Quit date: 08/31/2011  . Smokeless tobacco: Never Used  . Alcohol Use: No   OB History   Grav Para Term Preterm Abortions TAB SAB Ect Mult Living                 Review of Systems  Constitutional: Negative for fever.  Musculoskeletal:       Malaise  All other systems reviewed and are negative.    Allergies  Bactrim; Contrast media; Gadolinium derivatives; Iohexol; Penicillins; Sulfa antibiotics; Avelox; Ciprofloxacin; Delsym; and Hydromet  Home Medications   Current Outpatient Rx  Name  Route  Sig  Dispense  Refill  . acetaminophen (TYLENOL) 500 MG tablet   Oral   Take 500 mg by mouth every 6 (six) hours as needed (pain).         Marland Kitchen albuterol (PROVENTIL HFA;VENTOLIN HFA) 108 (90 BASE) MCG/ACT inhaler   Inhalation   Inhale 2 puffs into the lungs every 6 (six) hours as  needed for wheezing.   3 Inhaler   0   . ALPRAZolam (XANAX) 1 MG tablet   Oral   Take 1 mg by mouth 3 (three) times daily as needed for anxiety.         Marland Kitchen aspirin EC 325 MG EC tablet   Oral   Take 1 tablet (325 mg total) by mouth daily.   30 tablet   0   . baclofen (LIORESAL) 10 MG tablet   Oral   Take 10 mg by mouth 3 (three) times daily.         . benzonatate (TESSALON) 200 MG capsule   Oral   Take 200 mg by mouth 3 (three) times daily as needed for cough.         . calcium carbonate (OS-CAL) 600 MG TABS   Oral   Take 600 mg by mouth every other day.          . carvedilol (COREG) 6.25 MG tablet   Oral   Take 1 tablet (6.25 mg total) by mouth 2 (two) times daily with a meal.   180 tablet   3   . cetirizine (ZYRTEC) 10 MG tablet   Oral   Take 10 mg by mouth daily as needed for allergies.         . Cholecalciferol (VITAMIN D3) 5000 UNITS TABS   Oral   Take 1 tablet by mouth every other day.          . furosemide (LASIX) 20 MG tablet   Oral   Take 1 tablet (20 mg total) by mouth daily.   30 tablet   0   . guaiFENesin (MUCINEX) 600 MG 12 hr tablet   Oral   Take 1 tablet (600 mg total) by mouth 2 (two) times daily.   60 tablet   5   . Ipratropium-Albuterol (COMBIVENT) 20-100 MCG/ACT AERS respimat   Inhalation   Inhale 1 puff into the lungs every 6 (six) hours as needed for wheezing or shortness of breath.   4 g   3   . levofloxacin (LEVAQUIN) 500 MG tablet   Oral   Take 1 tablet (500 mg total) by mouth daily.   7 tablet   0   . levothyroxine (SYNTHROID, LEVOTHROID) 175 MCG tablet   Oral   Take 1 tablet (175 mcg total) by mouth daily.   30 tablet   0   . losartan-hydrochlorothiazide (HYZAAR) 50-12.5 MG per tablet   Oral  Take 1 tablet by mouth daily.          . metFORMIN (GLUCOPHAGE-XR) 500 MG 24 hr tablet   Oral   Take 1 tablet (500 mg total) by mouth daily with breakfast.   90 tablet   2   . mometasone (NASONEX) 50 MCG/ACT  nasal spray   Nasal   Place 2 sprays into the nose daily.   51 g   3   . ondansetron (ZOFRAN) 4 MG tablet      Take 1 tablet (4mg ) by  mouth every 8 hours as  needed for nausea   240 tablet   0   . pantoprazole (PROTONIX) 40 MG tablet      Take 1 tablet by mouth  daily   90 tablet   1   . potassium chloride SA (K-DUR,KLOR-CON) 20 MEQ tablet   Oral   Take 1 tablet (20 mEq total) by mouth daily.   90 tablet   1   . sertraline (ZOLOFT) 50 MG tablet      Take 1 tablet by mouth  daily   90 tablet   3    BP 148/63  Pulse 81  Temp(Src) 98.4 F (36.9 C) (Oral)  Resp 16  SpO2 95% Physical Exam  Nursing note and vitals reviewed. Constitutional: She is oriented to person, place, and time. She appears well-developed and well-nourished. No distress.  HENT:  Head: Normocephalic and atraumatic.  Mouth/Throat: Oropharynx is clear and moist.  Eyes: Conjunctivae and EOM are normal. Pupils are equal, round, and reactive to light.  Neck: Normal range of motion. Neck supple.  Cardiovascular: Normal rate, regular rhythm and intact distal pulses.   No murmur heard. Pulmonary/Chest: Effort normal and breath sounds normal. No respiratory distress. She has no wheezes. She has no rales.  Abdominal: Soft. She exhibits no distension. There is no tenderness. There is no rebound and no guarding.  Musculoskeletal: Normal range of motion. She exhibits no edema and no tenderness.  Neurological: She is alert and oriented to person, place, and time.  Skin: Skin is warm and dry. No rash noted. No erythema.  Psychiatric: She has a normal mood and affect. Her behavior is normal.    ED Course  Procedures (including critical care time) Labs Review Labs Reviewed  POCT I-STAT, CHEM 8 - Abnormal; Notable for the following:    Glucose, Bld 137 (*)    All other components within normal limits  CBC WITH DIFFERENTIAL   Imaging Review Dg Chest 2 View  11/24/2013   CLINICAL DATA:  Cough.  Weakness.   Recent diagnosis of pneumonia.  EXAM: CHEST  2 VIEW  COMPARISON:  11/22/2013  FINDINGS: Cardiac silhouette is mildly enlarged. Aorta is mildly uncoiled. No mediastinal or hilar masses. Lungs are hyperexpanded. Small, relatively dense, left lower lobe nodule lies in the superior segment. Stable. This is a granuloma. There is opacity posteriorly in the left lower lobe on the lateral view reflecting area of chronic scarring/ atelectasis based on a prior CT dated 05/17/2013. There is mild additional reticular opacity at the lung bases likely scarring, subsegmental atelectasis or a combination. Lungs are otherwise clear with no consolidation or acute finding.  No pleural effusion or pneumothorax.  The bony thorax is demineralized but intact.  IMPRESSION: No acute cardiopulmonary disease.  Areas of chronic scarring/ subsegmental atelectasis small stable left lower lobe granuloma. Hyperexpanded lungs suggests COPD, also stable.   Electronically Signed   By: Amie Portland M.D.   On:  11/24/2013 12:15    EKG Interpretation   None       MDM   1. Bronchitis     Patient is here due to concern for pneumonia. She was seen Saturday in urgent care and told she had pneumonia and given an antibiotic but she is not sure what it was. She saw her PCP 2 days ago and was told she had a pneumonia and was given Levaquin. She's been taking that daily and states that she just does not feel good. She has mild nausea and cough but denies shortness of breath, abdominal pain, vomiting or diarrhea. Patient has normal vital signs and satting between 93 and 95% on room air. She has clear lungs bilaterally on my exam and no signs of fluid overload. She currently is afebrile and is not having any febrile illness at home. Chest x-ray without any signs of pneumonia at this time. I-STAT and CBC are within normal limits. Patient's symptoms are not suggestive of cardiac etiology as she denies any chest pain, shortness of breath or diaphoresis.    Will d/c home and have pt continue levaquin and f/u with PCP.    Gwyneth SproutWhitney Wandell Scullion, MD 11/24/13 1312

## 2013-11-24 NOTE — ED Notes (Signed)
Bed: WA03 Expected date:  Expected time:  Means of arrival:  Comments: EMS pneumonia

## 2013-11-24 NOTE — Discharge Instructions (Signed)
Bronchitis °Bronchitis is swelling (inflammation) of the air tubes leading to your lungs (bronchi). This causes mucus and a cough. If the swelling gets bad, you may have trouble breathing. °HOME CARE  °· Rest. °· Drink enough fluids to keep your pee (urine) clear or pale yellow (unless you have a condition where you have to watch how much you drink). °· Only take medicine as told by your doctor. If you were given antibiotic medicines, finish them even if you start to feel better. °· Avoid smoke, irritating chemicals, and strong smells. These make the problem worse. Quit smoking if you smoke. This helps your lungs heal faster. °· Use a cool mist humidifier. Change the water in the humidifier every day. You can also sit in the bathroom with hot shower running for 5 10 minutes. Keep the door closed. °· See your health care provider as told. °· Wash your hands often. °GET HELP IF: °Your problems do not get better after 1 week. °GET HELP RIGHT AWAY IF:  °· Your fever gets worse. °· You have chills. °· Your chest hurts. °· Your problems breathing get worse. °· You have blood in your mucus. °· You pass out (faint). °· You feel lightheaded. °· You have a bad headache. °· You throw up (vomit) again and again. °MAKE SURE YOU: °· Understand these instructions. °· Will watch your condition. °· Will get help right away if you are not doing well or get worse. °Document Released: 04/15/2008 Document Revised: 08/18/2013 Document Reviewed: 06/22/2013 °[employer]® Patient Information ©2014 [employer]. ° °

## 2013-11-24 NOTE — ED Notes (Signed)
Pt presents with NAD- Per [facility]- Pt seen at Urgent Care last Saturday DX with Pneumonia. Followed up with PCP next day. RX given antibiotic. Pt c/o today not feeling well. Denies CP/SOB

## 2013-11-29 ENCOUNTER — Encounter: Payer: Self-pay | Admitting: Internal Medicine

## 2013-11-29 ENCOUNTER — Ambulatory Visit (INDEPENDENT_AMBULATORY_CARE_PROVIDER_SITE_OTHER): Payer: Medicare Other | Admitting: Internal Medicine

## 2013-11-29 VITALS — BP 120/72 | HR 83 | Temp 98.0°F | Wt 177.4 lb

## 2013-11-29 DIAGNOSIS — F419 Anxiety disorder, unspecified: Secondary | ICD-10-CM

## 2013-11-29 DIAGNOSIS — F411 Generalized anxiety disorder: Secondary | ICD-10-CM

## 2013-11-29 DIAGNOSIS — G514 Facial myokymia: Secondary | ICD-10-CM

## 2013-11-29 DIAGNOSIS — G518 Other disorders of facial nerve: Secondary | ICD-10-CM

## 2013-11-29 DIAGNOSIS — F0390 Unspecified dementia without behavioral disturbance: Secondary | ICD-10-CM

## 2013-11-29 MED ORDER — GABAPENTIN 100 MG PO CAPS: 100.0000 mg | ORAL_CAPSULE | Freq: Every day | ORAL | Status: DC

## 2013-11-29 MED ORDER — ALPRAZOLAM 1 MG PO TABS: 1.0000 mg | ORAL_TABLET | Freq: Three times a day (TID) | ORAL | Status: DC | PRN

## 2013-11-29 NOTE — Assessment & Plan Note (Signed)
Chronic disease which overlaps other somatic concerns

## 2013-11-29 NOTE — Assessment & Plan Note (Signed)
eval and tx ongoing by neuro - but pt ? benefit of botox injections q3mo -  hosp for "severe" spasm L face 10/2013 - no CVA, ?TIA Advised follow up for repeat injection as needed (neuro feels psychogenic) Continue baclofen as ongoing - increased dose 10/24/13 

## 2013-11-29 NOTE — Progress Notes (Signed)
Subjective:    Patient ID: Paula [name], female    DOB: [birth year], 80 y.o.   MRN: [id number]  HPI  Here for followup -  facial spasm, back pain, meds   Past Medical History  Diagnosis Date  . Depression   . Ejection fraction     EF 40%, echo, November, 2012  / in improved, EF 60%, echo, December, 2012  . Contrast media allergy     Patient feels poorly with contrast  . Status post AAA (abdominal aortic aneurysm) repair     Surgical repair, Dr. Hart Rochester, December 27, 2011  . Pre-syncope     vasovagal w/ heart block  . Parotid mass     has refused further eval 10/2011  . GERD (gastroesophageal reflux disease)   . Anxiety   . Facial tic     L sided spasms - Botox trial summer 2013, ?effective  . Macular degeneration   . AAA (abdominal aortic aneurysm)     s/p repair 12/2011  . Congestive heart failure     NL EF 10/2011 echo   . Diabetes mellitus type II, controlled   . Hypothyroidism   . Sinus bradycardia 09/19/2011    Occurring simultaneously with complete heart block   . Hypertension   . COPD (chronic obstructive pulmonary disease)   . Abdominal spasms     hemifacial-treated w/ botox xeomin -Dr Terrace Arabia  . Pneumonia     Review of Systems  Constitutional: Positive for fatigue (chronic). Negative for fever, activity change, appetite change and unexpected weight change.  Respiratory: Positive for cough (chronic). Negative for chest tightness and shortness of breath.   Cardiovascular: Negative for chest pain, palpitations and leg swelling.  Neurological: Negative for dizziness, syncope and headaches.       Objective:   Physical Exam BP 120/72  Pulse 83  Temp(Src) 98 F (36.7 C) (Oral)  Wt 177 lb 6.4 oz (80.468 kg)  SpO2 93% Wt Readings from Last 3 Encounters:  11/29/13 177 lb 6.4 oz (80.468 kg)  11/22/13 177 lb (80.287 kg)  11/15/13 173 lb 12.8 oz (78.835 kg)   Constitutional: She appears well-developed and well-nourished. No distress. Spouse at side  Neck: Normal  range of motion. Neck supple. No JVD present. No thyromegaly present.  Cardiovascular: Normal rate, regular rhythm and normal heart sounds.  No murmur heard. No BLE edema. Pulmonary/Chest: Effort normal and breath sounds normal. No respiratory distress. She has no wheezes.  Neurological: She is alert and oriented to person, place, and time. No cranial nerve deficit. Coordination, balance, strength, speech and gait are normal.  Skin: Skin is warm and dry. No rash noted. No erythema.  Psychiatric: She has a perseverating and dysphoric/mildly anxious mood and affect. Her behavior is normal. Judgment and thought minimally impaired.   Lab Results  Component Value Date   WBC 10.1 11/24/2013   HGB 12.2 11/24/2013   HCT 36.0 11/24/2013   PLT 291 11/24/2013   GLUCOSE 137* 11/24/2013   CHOL 153 11/07/2013   TRIG 282* 11/07/2013   HDL 28* 11/07/2013   LDLDIRECT 90.6 02/10/2013   LDLCALC 69 11/07/2013   ALT 16 11/06/2013   AST 17 11/06/2013   NA 143 11/24/2013   K 4.3 11/24/2013   CL 103 11/24/2013   CREATININE 1.10 11/24/2013   BUN 22 11/24/2013   CO2 27 11/06/2013   TSH 0.359 08/22/2013   INR 1.03 11/06/2013   HGBA1C 7.3* 11/07/2013       Assessment & Plan:  See problem list. Medications and labs reviewed today.

## 2013-11-29 NOTE — Patient Instructions (Signed)
It was good to see you today.  We have reviewed your prior records including labs and tests today  Medications reviewed and updated, no other changes recommended at this time. Refill on medication(s) as discussed today.

## 2013-11-29 NOTE — Progress Notes (Signed)
Pre-visit discussion using our clinic review tool. No additional management support is needed unless otherwise documented below in the visit note.  

## 2013-11-29 NOTE — Assessment & Plan Note (Signed)
Significant chronic anxiety (spouse and pt), likely contributing to chronic "unwell" symptoms Review and reassurance provided to patient and spouse today Continue same sertraline  Last increased BZ to TID 02/2013 - refill done S/p counseling with gutterman 02/2013 x 1 OV, to follow up prn

## 2013-12-01 ENCOUNTER — Emergency Department (HOSPITAL_COMMUNITY): Payer: Medicare Other

## 2013-12-01 ENCOUNTER — Telehealth: Payer: Self-pay | Admitting: *Deleted

## 2013-12-01 ENCOUNTER — Emergency Department (HOSPITAL_COMMUNITY)
Admission: EM | Admit: 2013-12-01 | Discharge: 2013-12-01 | Disposition: A | Discharge status: Home / Self Care | Payer: Medicare Other | Attending: Emergency Medicine | Admitting: Emergency Medicine

## 2013-12-01 ENCOUNTER — Ambulatory Visit (INDEPENDENT_AMBULATORY_CARE_PROVIDER_SITE_OTHER): Payer: Medicare Other | Admitting: Neurology

## 2013-12-01 ENCOUNTER — Encounter: Payer: Self-pay | Admitting: Neurology

## 2013-12-01 ENCOUNTER — Encounter (HOSPITAL_COMMUNITY): Payer: Self-pay | Admitting: Emergency Medicine

## 2013-12-01 VITALS — BP 126/73 | HR 80 | Ht 65.0 in | Wt 176.0 lb

## 2013-12-01 DIAGNOSIS — J449 Chronic obstructive pulmonary disease, unspecified: Secondary | ICD-10-CM | POA: Insufficient documentation

## 2013-12-01 DIAGNOSIS — E039 Hypothyroidism, unspecified: Secondary | ICD-10-CM | POA: Insufficient documentation

## 2013-12-01 DIAGNOSIS — I10 Essential (primary) hypertension: Secondary | ICD-10-CM | POA: Insufficient documentation

## 2013-12-01 DIAGNOSIS — E119 Type 2 diabetes mellitus without complications: Secondary | ICD-10-CM | POA: Insufficient documentation

## 2013-12-01 DIAGNOSIS — E876 Hypokalemia: Secondary | ICD-10-CM | POA: Insufficient documentation

## 2013-12-01 DIAGNOSIS — Z79899 Other long term (current) drug therapy: Secondary | ICD-10-CM | POA: Insufficient documentation

## 2013-12-01 DIAGNOSIS — G518 Other disorders of facial nerve: Secondary | ICD-10-CM

## 2013-12-01 DIAGNOSIS — Z8669 Personal history of other diseases of the nervous system and sense organs: Secondary | ICD-10-CM | POA: Insufficient documentation

## 2013-12-01 DIAGNOSIS — G5139 Clonic hemifacial spasm, unspecified: Secondary | ICD-10-CM

## 2013-12-01 DIAGNOSIS — Z8701 Personal history of pneumonia (recurrent): Secondary | ICD-10-CM | POA: Insufficient documentation

## 2013-12-01 DIAGNOSIS — F3289 Other specified depressive episodes: Secondary | ICD-10-CM | POA: Insufficient documentation

## 2013-12-01 DIAGNOSIS — K219 Gastro-esophageal reflux disease without esophagitis: Secondary | ICD-10-CM | POA: Insufficient documentation

## 2013-12-01 DIAGNOSIS — R5383 Other fatigue: Secondary | ICD-10-CM

## 2013-12-01 DIAGNOSIS — J4489 Other specified chronic obstructive pulmonary disease: Secondary | ICD-10-CM | POA: Insufficient documentation

## 2013-12-01 DIAGNOSIS — IMO0002 Reserved for concepts with insufficient information to code with codable children: Secondary | ICD-10-CM | POA: Insufficient documentation

## 2013-12-01 DIAGNOSIS — I498 Other specified cardiac arrhythmias: Secondary | ICD-10-CM | POA: Insufficient documentation

## 2013-12-01 DIAGNOSIS — F411 Generalized anxiety disorder: Secondary | ICD-10-CM | POA: Insufficient documentation

## 2013-12-01 DIAGNOSIS — Z950 Presence of cardiac pacemaker: Secondary | ICD-10-CM | POA: Insufficient documentation

## 2013-12-01 DIAGNOSIS — Z880 Allergy status to penicillin: Secondary | ICD-10-CM | POA: Insufficient documentation

## 2013-12-01 DIAGNOSIS — F329 Major depressive disorder, single episode, unspecified: Secondary | ICD-10-CM | POA: Insufficient documentation

## 2013-12-01 DIAGNOSIS — R5381 Other malaise: Secondary | ICD-10-CM | POA: Insufficient documentation

## 2013-12-01 DIAGNOSIS — R51 Headache: Secondary | ICD-10-CM | POA: Insufficient documentation

## 2013-12-01 DIAGNOSIS — I509 Heart failure, unspecified: Secondary | ICD-10-CM | POA: Insufficient documentation

## 2013-12-01 DIAGNOSIS — Z87891 Personal history of nicotine dependence: Secondary | ICD-10-CM | POA: Insufficient documentation

## 2013-12-01 DIAGNOSIS — Z7982 Long term (current) use of aspirin: Secondary | ICD-10-CM | POA: Insufficient documentation

## 2013-12-01 DIAGNOSIS — Z91041 Radiographic dye allergy status: Secondary | ICD-10-CM | POA: Insufficient documentation

## 2013-12-01 LAB — BASIC METABOLIC PANEL
BUN: 15 mg/dL (ref 6–23)
CHLORIDE: 102 meq/L (ref 96–112)
CO2: 27 meq/L (ref 19–32)
Calcium: 9.5 mg/dL (ref 8.4–10.5)
Creatinine, Ser: 0.97 mg/dL (ref 0.50–1.10)
GFR calc Af Amer: 60 mL/min — ABNORMAL LOW (ref >90)
GFR calc non Af Amer: 52 mL/min — ABNORMAL LOW (ref >90)
GLUCOSE: 147 mg/dL — AB (ref 70–99)
POTASSIUM: 3.2 meq/L — AB (ref 3.7–5.3)
SODIUM: 142 meq/L (ref 137–147)

## 2013-12-01 LAB — URINALYSIS, ROUTINE W REFLEX MICROSCOPIC
GLUCOSE, UA: NEGATIVE mg/dL
Hgb urine dipstick: NEGATIVE
Ketones, ur: NEGATIVE mg/dL
Leukocytes, UA: NEGATIVE
Nitrite: NEGATIVE
Protein, ur: NEGATIVE mg/dL
SPECIFIC GRAVITY, URINE: 1.031 — AB (ref 1.005–1.030)
Urobilinogen, UA: 0.2 mg/dL (ref 0.0–1.0)
pH: 5.5 (ref 5.0–8.0)

## 2013-12-01 LAB — CBC WITH DIFFERENTIAL/PLATELET
Basophils Absolute: 0.1 10*3/uL (ref 0.0–0.1)
Basophils Relative: 1 % (ref 0–1)
Eosinophils Absolute: 0.6 10*3/uL (ref 0.0–0.7)
Eosinophils Relative: 6 % — ABNORMAL HIGH (ref 0–5)
HCT: 33 % — ABNORMAL LOW (ref 36.0–46.0)
Hemoglobin: 10.8 g/dL — ABNORMAL LOW (ref 12.0–15.0)
LYMPHS ABS: 2.6 10*3/uL (ref 0.7–4.0)
LYMPHS PCT: 27 % (ref 12–46)
MCH: 34.2 pg — ABNORMAL HIGH (ref 26.0–34.0)
MCHC: 32.7 g/dL (ref 30.0–36.0)
MCV: 104.4 fL — AB (ref 78.0–100.0)
Monocytes Absolute: 0.8 10*3/uL (ref 0.1–1.0)
Monocytes Relative: 8 % (ref 3–12)
NEUTROS PCT: 59 % (ref 43–77)
Neutro Abs: 5.5 10*3/uL (ref 1.7–7.7)
Platelets: 256 10*3/uL (ref 150–400)
RBC: 3.16 MIL/uL — ABNORMAL LOW (ref 3.87–5.11)
RDW: 14.8 % (ref 11.5–15.5)
WBC: 9.5 10*3/uL (ref 4.0–10.5)

## 2013-12-01 MED ORDER — POTASSIUM CHLORIDE CRYS ER 20 MEQ PO TBCR
20.0000 meq | EXTENDED_RELEASE_TABLET | Freq: Two times a day (BID) | ORAL | Status: DC
  Administered 2013-12-01: 20 meq via ORAL
  Filled 2013-12-01: qty 1

## 2013-12-01 MED ORDER — INCOBOTULINUMTOXINA 50 UNITS IM SOLR
50.0000 [IU] | Freq: Once | INTRAMUSCULAR | Status: AC
  Administered 2013-12-01: 50 [IU] via INTRAMUSCULAR

## 2013-12-01 NOTE — ED Notes (Signed)
Pt from home transported by [facility]. Pt c/o having the flu. Was seen at Urgent Care and dx with bronchitis and PNA. Pt reports not getting better but worse.

## 2013-12-01 NOTE — Discharge Instructions (Signed)
Hypokalemia Hypokalemia means that the amount of potassium in the blood is lower than normal.Potassium is a chemical, called an electrolyte, that helps regulate the amount of fluid in the body. It also stimulates muscle contraction and helps nerves function properly.Most of the body's potassium is inside of cells, and only a very small amount is in the blood. Because the amount in the blood is so small, minor changes can be life-threatening. CAUSES  Antibiotics.  Diarrhea or vomiting.  Using laxatives too much, which can cause diarrhea.  Chronic kidney disease.  Water pills (diuretics).  Eating disorders (bulimia).  Low magnesium level.  Sweating a lot. SIGNS AND SYMPTOMS  Weakness.  Constipation.  Fatigue.  Muscle cramps.  Mental confusion.  Skipped heartbeats or irregular heartbeat (palpitations).  Tingling or numbness. DIAGNOSIS  Your health care provider can diagnose hypokalemia with blood tests. In addition to checking your potassium level, your health care provider may also check other lab tests. TREATMENT Hypokalemia can be treated with potassium supplements taken by mouth or adjustments in your current medicines. If your potassium level is very low, you may need to get potassium through a vein (IV) and be monitored in the hospital. A diet high in potassium is also helpful. Foods high in potassium are:  Nuts, such as peanuts and pistachios.  Seeds, such as sunflower seeds and pumpkin seeds.  Peas, lentils, and lima beans.  Whole grain and bran cereals and breads.  Fresh fruit and vegetables, such as apricots, avocado, bananas, cantaloupe, kiwi, oranges, tomatoes, asparagus, and potatoes.  Orange and tomato juices.  Red meats.  Fruit yogurt. HOME CARE INSTRUCTIONS  Take all medicines as prescribed by your health care provider.  Maintain a healthy diet by including nutritious food, such as fruits, vegetables, nuts, whole grains, and lean meats.  If  you are taking a laxative, be sure to follow the directions on the label. SEEK MEDICAL CARE IF:  Your weakness gets worse.  You feel your heart pounding or racing.  You are vomiting or having diarrhea.  You are diabetic and having trouble keeping your blood glucose in the normal range. SEEK IMMEDIATE MEDICAL CARE IF:  You have chest pain, shortness of breath, or dizziness.  You are vomiting or having diarrhea for more than 2 days.  You faint. MAKE SURE YOU:   Understand these instructions.  Will watch your condition.  Will get help right away if you are not doing well or get worse. Document Released: 10/28/2005 Document Revised: 08/18/2013 Document Reviewed: 04/30/2013 [facility] Patient Information 2014 [place], [place].  Potassium Content of Foods Potassium is a mineral found in many foods and drinks. It helps keep fluids and minerals balanced in your body and also affects how steadily your heart beats. The body needs potassium to control blood pressure and to keep the muscles and nervous system healthy. However, certain health conditions and medicine may require you to eat more or less potassium-rich foods and drinks. Your caregiver or dietitian will tell you how much potassium you should have each day. COMMON SERVING SIZES The list below tells you how big or small common portion sizes are:  1 oz.........4 stacked dice.  3 oz........Marland KitchenDeck of cards.  1 tsp.......Marland KitchenTip of little finger.  1 tbsp....Marland KitchenMarland KitchenThumb.  2 tbsp....Marland KitchenMarland KitchenGolf ball.   c..........Marland KitchenHalf of a fist.  1 c...........Marland KitchenA fist. FOODS AND DRINKS HIGH IN POTASSIUM More than 200 mg of potassium per serving. A serving size is  c (120 mL or noted gram weight) unless otherwise stated. While all  the items on this list are high in potassium, some items are higher in potassium than others. Fruits  Apricots (sliced), 83 g.  Apricots (dried halves), 3 oz / 24 g.  Avocado (cubed),  c / 50 g.  Banana (sliced), 75  g.  Cantaloupe (cubed), 80 g.  Dates (pitted), 5 whole / 35 g.  Figs (dried), 4 whole / 32 g.  Guava, c / 55 g.  Honeydew, 1 wedge / 85 g.  Kiwi (sliced), 90 g.  Nectarine, 1 small / 129 g.  Orange, 1 medium / 131 g.  Orange juice.  Pomegranate seeds, 87 g.  Pomegranate juice.  Prunes (pitted), 3 whole / 30 g.  Prune juice, 3 oz / 90 mL.  Seedless raisins, 3 tbsp / 27 g. Vegetables  Artichoke,  of a medium / 64 g.  Asparagus (boiled), 90 g.  Baked beans,  c / 63 g.  Bamboo shoots,  c / 38 g.  Beets (cooked slices), 85 g.  Broccoli (boiled), 78 g.  Brussels sprout (boiled), 78 g.  Butternut squash (baked), 103 g.  Chickpea (cooked), 82 g.  Green peas (cooked), 80 g.  Hubbard squash (baked cubes),  c / 68 g.  Kidney beans (cooked), 5 tbsp / 55 g.  Lima beans (cooked),  c / 43 g.  Navy beans (cooked),  c / 61 g.  Potato (baked), 61 g.  Potato (boiled), 78 g.  Pumpkin (boiled), 123 g.  Refried beans,  c / 79 g.  Spinach (cooked),  c / 45 g.  Split peas (cooked),  c / 65 g.  Sun-dried tomatoes, 2 tbsp / 7 g.  Sweet potato (baked),  c / 50 g.  Tomato (chopped or sliced), 90 g.  Tomato juice.  Tomato paste, 4 tsp / 21 g.  Tomato sauce,  c / 61 g.  Vegetable juice.  White mushrooms (cooked), 78 g.  Yam (cooked or baked),  c / 34 g.  Zucchini squash (boiled), 90 g. Other Foods and Drinks  Almonds (whole),  c / 36 g.  Cashews (oil roasted),  c / 32 g.  Chocolate milk.  Chocolate pudding, 142 g.  Clams (steamed), 1.5 oz / 43 g.  Dark chocolate, 1.5 oz / 42 g.  Fish, 3 oz / 85 g.  King crab (steamed), 3 oz / 85 g.  Lobster (steamed), 4 oz / 113 g.  Milk (skim, 1%, 2%, whole), 1 c / 240 mL.  Milk chocolate, 2.3 oz / 66 g.  Milk shake.  Nonfat fruit variety yogurt, 123 g.  Peanuts (oil roasted), 1 oz / 28 g.  Peanut butter, 2 tbsp / 32 g.  Pistachio nuts, 1 oz / 28 g.  Pumpkin seeds, 1 oz / 28  g.  Red meat (broiled, cooked, grilled), 3 oz / 85 g.  Scallops (steamed), 3 oz / 85 g.  Shredded wheat cereal (dry), 3 oblong biscuits / 75 g.  Spaghetti sauce,  c / 66 g.  Sunflower seeds (dry roasted), 1 oz / 28 g.  Veggie burger, 1 patty / 70 g. FOODS MODERATE IN POTASSIUM Between 150 mg and 200 mg per serving. A serving is  c (120 mL or noted gram weight) unless otherwise stated. Fruits  Grapefruit,  of the fruit / 123 g.  Grapefruit juice.  Pineapple juice.  Plums (sliced), 83 g.  Tangerine, 1 large / 120 g. Vegetables  Carrots (boiled), 78 g.  Carrots (sliced), 61 g.  Rhubarb (cooked  with sugar), 120 g.  Rutabaga (cooked), 120 g.  Sweet corn (cooked), 75 g.  Yellow snap beans (cooked), 63 g. Other Foods and Drinks   Bagel, 1 bagel / 98 g.  Chicken breast (roasted and chopped),  c / 70 g.  Chocolate ice cream / 66 g.  Pita bread, 1 large / 64 g.  Shrimp (steamed), 4 oz / 113 g.  Swiss cheese (diced), 70 g.  Vanilla ice cream, 66 g.  Vanilla pudding, 140 g. FOODS LOW IN POTASSIUM Less than 150 mg per serving. A serving size is  cup (120 mL or noted gram weight) unless otherwise stated. If you eat more than 1 serving of a food low in potassium, the food may be considered a food high in potassium. Fruits  Apple (slices), 55 g.  Apple juice.  Applesauce, 122 g.  Blackberries, 72 g.  Blueberries, 74 g.  Cranberries, 50 g.  Cranberry juice.  Fruit cocktail, 119 g.  Fruit punch.  Grapes, 46 g.  Grape juice.  Mandarin oranges (canned), 126 g.  Peach (slices), 77 g.  Pineapple (chunks), 83 g.  Raspberries, 62 g.  Red cherries (without pits), 78 g.  Strawberries (sliced), 83 g.  Watermelon (diced), 76 g. Vegetables  Alfalfa sprouts, 17 g.  Bell peppers (sliced), 46 g.  Cabbage (shredded), 35 g.  Cauliflower (boiled), 62 g.  Celery, 51 g.  Collard greens (boiled), 95 g.  Cucumber (sliced), 52 g.  Eggplant  (cubed), 41 g.  Green beans (boiled), 63 g.  Lettuce (shredded), 1 c / 36 g.  Onions (sauteed), 44 g.  Radishes (sliced), 58 g.  Spaghetti squash, 51 g. Other Foods and Drinks  [employer] food cake, 1 slice / 28 g.  Black tea.  Brown rice (cooked), 98 g.  Butter croissant, 1 medium / 57 g.  Carbonated soda.  Coffee.  Cheddar cheese (diced), 66 g.  Corn flake cereal (dry), 14 g.  Cottage cheese, 118 g.  Cream of rice cereal (cooked), 122 g.  Cream of wheat cereal (cooked), 126 g.  Crisped rice cereal (dry), 14 g.  Egg (boiled, fried, poached, omelet, scrambled), 1 large / 46 61 g.  English muffin, 1 muffin / 57 g.  Frozen ice pop, 1 pop / 55 g.  Graham cracker, 1 large rectangular cracker / 14 g.  Jelly beans, 112 g.  Non-dairy whipped topping.  Oatmeal, 88 g.  Orange sherbet, 74 g.  Puffed rice cereal (dry), 7 g.  Pasta (cooked), 70 g.  Rice cakes, 4 cakes / 36 g.  Sugared doughnut, 4 oz / 116 g.  White bread, 1 slice / 30 g.  White rice (cooked), 79 93 g.  Wild rice (cooked), 82 g.  Yellow cake, 1 slice / 68 g. Document Released: 06/11/2005 Document Revised: 10/14/2012 Document Reviewed: 03/13/2012 [facility] Patient Information 2014 [place], [place]. Today, your potassium was found to be low, you were given a supplement U. been discharged with diet recommendations.  Please followup with your primary care physician in one to 2 weeks to have a repeat level done

## 2013-12-01 NOTE — Progress Notes (Signed)
History of Present Illness   Paula [name] is a a 80 years old right-handed Caucasian female, referred by Dr. Vickey Hugerohmeier for the evaluation of EMG guided bottulinumtoxin injection for left hemifacial spasm, she is accompanied by her husband.  She has past medical history of diet-controlled diabetes, coronary artery disease, husband reported 3 episode of sudden loss of consciousness from October to December 2012, "telemetry went flat", she also underwent aortic aneurysm repair  in December 27, 2011,She reported 6 years history of gradual onset left facial spasm, she denies history of left Bell's palsy, initial spasm involving left eye, now spreading to involving her whole left face, bothersome to her, she actually presented to the emergency room twice recently, because severe left facial twitching, to the point of causing anxiety, " I was so scared" She denied left facial sensory change, she has poor vision due to macular degeneration bilaterally, reported acute worsening of right vision since most recent aortic aneurysm repair,She also reports few weeks history of acute worsening gait difficulty, stuttering of her speech,I reviewed most recent MRI of brain in December 2012, there was mild atrophy, extensive periventricular white matter disease, MRA of the brain showed no large vessel disease.  I performed EMG guided xeomin injection into her left face in  07/14/2012, 22.5 unit, she responded very well, there was no significant side effect, because she was doing so well, I do not perform scheduled injection in February 2014,   She came in earlier than expected today, complains of coughing, mild fever, not feeling well, was just evaluated by her primary care physician, does not want to have injection today she denied recurrence of her left facial spasm.  She was reevaluated by Dr. Vickey Hugerohmeier in April 28th 2014.  UPDATE Jan 15th 2015 She has received EMG guided Xeomin injection in Oct 2014, responded well, I do  not see much twitching of her left face today, patient thinks it still to have repeat injection because it does help her, she felt inside twitching of her face all the time   Physical Exam   General: well developed, well  nourished, seated,anxious-looking, intermittent wet cough  Head: head  normocephalic and atraumatic.  Oropharynx benign. Ears, Nose and Throat: mallojmpati2  Neck: supple, no carotid bruits  Respiratory: clear to auscultation bilaterally Cardiovascular: regular rate rhythm Skin: no edema   Neurologic Exam  Mental Status: elderly caucasain female, mild slow speech, MMSE 21/27 today, she is not oriented to year, date, has difficulty spell words backwards, could not write a sentence or copy design because of poor vision Cranial Nerves: PERL. EOM intact without nystamus. Visual fields full. Hearing intack.  She has occasional left facial twitching involving her left and right lower eyelid. There was no significant asymmetry of her face. Motor:  no significant weakness in the extremities   Sensory: Normal to light touch, pinprick, and vibratory in all extremities.   Coordination:  There was no dysmetria noticed. Gait and Station: push up from seated position, cautious,  mild unsteady, dragging right leg, Enblock turning,  No assistive device Reflexes: Deep tendon reflexes:2+.  Plantar responses are flexor.   Assessment and Plan: 80 year old with long-standing left hemifacial spasm since 2011, since her oral surgery. She has significant side effects from CBZ and stopped. Taking Xanax 4 times daily with reductionin spasms. Recent  AAA surgery in Feb 2013.   EMG guided Xeomin injection for left facial spasm. 22 point 5 units was used, (50 units of xeomin was dissolved into 1  cc of NS, Lot No [id number], Exp March 2015), discarded 27.5 units  Injection was placed at left orbicularis oculi at 2, 3,6, 8,clock, (2.5 units each x 4 = 10 units total),  Corrugate left 2.5, right 2.5  units. Right orbicularis oculi at 6, 9 clock=5 units.  Left frontalis 2 point 5 units x2= 5 units  She tolerated the injection well, will return to clinic in 3 months or longer for repeat injection.

## 2013-12-01 NOTE — ED Provider Notes (Signed)
CSN: [id number]     Arrival date & time 12/01/13  0020 History   First MD Initiated Contact with Patient 12/01/13 0119     Chief Complaint  Patient presents with  . Chills   (Consider location/radiation/quality/duration/timing/severity/associated sxs/prior Treatment) HPI Comments: Today while out shopping she suddenly became weak and had to sit down.  A short time later, she broke out in its proximal for head, with, nausea, but states she never vomits. Patient states, that she's been "sick" for the past 2, years, and nobody can figure out what is causing her illnesses. She denies any chest pain, constipation, diarrhea, worsening shortness of breath, change in appetite, or weight, URI, symptoms.  The history is provided by the patient and the spouse.    Past Medical History  Diagnosis Date  . Depression   . Ejection fraction     EF 40%, echo, November, 2012  / in improved, EF 60%, echo, December, 2012  . Contrast media allergy     Patient feels poorly with contrast  . Status post AAA (abdominal aortic aneurysm) repair     Surgical repair, Dr. Hart Rochester, December 27, 2011  . Pre-syncope     vasovagal w/ heart block  . Parotid mass     has refused further eval 10/2011  . GERD (gastroesophageal reflux disease)   . Anxiety   . Facial tic     L sided spasms - Botox trial summer 2013, ?effective  . Macular degeneration   . AAA (abdominal aortic aneurysm)     s/p repair 12/2011  . Congestive heart failure     NL EF 10/2011 echo   . Diabetes mellitus type II, controlled   . Hypothyroidism   . Sinus bradycardia 09/19/2011    Occurring simultaneously with complete heart block   . Hypertension   . COPD (chronic obstructive pulmonary disease)   . Abdominal spasms     hemifacial-treated w/ botox xeomin -Dr Terrace Arabia  . Pneumonia    Past Surgical History  Procedure Laterality Date  . Cholecystectomy    . Knee cartilage surgery      left  . Sinus surgery with instatrak    . Appendectomy     . Oophorectomy      ovarian cyst  . Cataract extraction      bilateral  . Bladder repair    . US echocardiography  10/14/11  . Abdominal aortic aneurysm repair  12/27/2011    Procedure: ANEURYSM ABDOMINAL AORTIC REPAIR;  Surgeon: Josephina Gip, MD;  Location: [facility] OR;  Service: Vascular;  Laterality: N/A;  Resection and Grafting Abdominal Aortic Aneurysm , Aorta Bi Iliac.  Marland Kitchen Pacemaker insertion  11/12  . Cardiac catheterization  11/04/11   Family History  Problem Relation Age of Onset  . Esophageal cancer Father   . Cancer Father   . Breast cancer Sister   . Cancer Sister   . Colon cancer Sister   . Heart disease Sister   . Heart disease Mother   . Heart disease Son    History  Substance Use Topics  . Smoking status: Former Smoker -- 1.00 packs/day for 30 years    Types: Cigarettes    Quit date: 08/31/2011  . Smokeless tobacco: Never Used  . Alcohol Use: No   OB History   Grav Para Term Preterm Abortions TAB SAB Ect Mult Living                 Review of Systems  Constitutional: Negative for fever  and chills.  HENT: Negative for postnasal drip and rhinorrhea.   Eyes: Negative for redness and visual disturbance.  Respiratory: Negative for shortness of breath.   Cardiovascular: Negative for chest pain and leg swelling.  Gastrointestinal: Positive for nausea. Negative for vomiting, diarrhea and abdominal distention.  Musculoskeletal: Negative for myalgias.  Skin: Negative for rash and wound.  Neurological: Positive for weakness. Negative for dizziness and headaches.  All other systems reviewed and are negative.    Allergies  Bactrim; Contrast media; Gadolinium derivatives; Iohexol; Penicillins; Sulfa antibiotics; Avelox; Ciprofloxacin; Delsym; and Hydromet  Home Medications   Current Outpatient Rx  Name  Route  Sig  Dispense  Refill  . acetaminophen (TYLENOL) 500 MG tablet   Oral   Take 500 mg by mouth every 6 (six) hours as needed (pain).         Marland Kitchen. albuterol  (PROVENTIL HFA;VENTOLIN HFA) 108 (90 BASE) MCG/ACT inhaler   Inhalation   Inhale 2 puffs into the lungs every 6 (six) hours as needed for wheezing or shortness of breath.         Marland Kitchen. albuterol-ipratropium (COMBIVENT) 18-103 MCG/ACT inhaler   Inhalation   Inhale 2 puffs into the lungs every 6 (six) hours as needed for wheezing or shortness of breath.         . ALPRAZolam (XANAX) 1 MG tablet   Oral   Take 1 mg by mouth 3 (three) times daily as needed for anxiety.         Marland Kitchen. aspirin 325 MG EC tablet   Oral   Take 325 mg by mouth daily.         . baclofen (LIORESAL) 10 MG tablet   Oral   Take 10 mg by mouth 3 (three) times daily.         . benzonatate (TESSALON) 200 MG capsule   Oral   Take 200 mg by mouth 3 (three) times daily as needed for cough.         . calcium carbonate (OS-CAL) 600 MG TABS   Oral   Take 600 mg by mouth every other day.          . carvedilol (COREG) 6.25 MG tablet   Oral   Take 6.25 mg by mouth 2 (two) times daily with a meal.         . cetirizine (ZYRTEC) 10 MG tablet   Oral   Take 10 mg by mouth daily as needed for allergies.         . Cholecalciferol (VITAMIN D3) 5000 UNITS TABS   Oral   Take 5,000 Units by mouth every other day.          . furosemide (LASIX) 20 MG tablet   Oral   Take 20 mg by mouth.         . gabapentin (NEURONTIN) 100 MG capsule   Oral   Take 100 mg by mouth at bedtime.         Marland Kitchen. guaiFENesin (MUCINEX) 600 MG 12 hr tablet   Oral   Take 600 mg by mouth 2 (two) times daily.         Marland Kitchen. levofloxacin (LEVAQUIN) 500 MG tablet   Oral   Take 1 tablet (500 mg total) by mouth daily.   7 tablet   0   . levothyroxine (SYNTHROID, LEVOTHROID) 175 MCG tablet   Oral   Take 175 mcg by mouth daily before breakfast.         . losartan-hydrochlorothiazide (  HYZAAR) 50-12.5 MG per tablet   Oral   Take 1 tablet by mouth daily.         . metFORMIN (GLUCOPHAGE-XR) 500 MG 24 hr tablet   Oral   Take 500 mg by  mouth daily with breakfast.         . mometasone (NASONEX) 50 MCG/ACT nasal spray   Nasal   Place 2 sprays into the nose daily as needed (allergies).         . ondansetron (ZOFRAN) 4 MG tablet   Oral   Take 4 mg by mouth every 8 (eight) hours as needed for nausea or vomiting.         . pantoprazole (PROTONIX) 40 MG tablet   Oral   Take 40 mg by mouth daily.         Bertram Gala Glycol-Propyl Glycol (SYSTANE) 0.4-0.3 % SOLN   Ophthalmic   Apply 1 drop to eye 2 (two) times daily as needed (dry eyes).         . potassium chloride SA (K-DUR,KLOR-CON) 20 MEQ tablet   Oral   Take 20 mEq by mouth daily.         . sertraline (ZOLOFT) 50 MG tablet   Oral   Take 50 mg by mouth daily.          BP 124/56  Pulse 82  Temp(Src) 98.4 F (36.9 C) (Oral)  Resp 18  SpO2 95% Physical Exam  Nursing note and vitals reviewed. Constitutional: She appears well-developed and well-nourished. No distress.  HENT:  Head: Normocephalic and atraumatic.  Mouth/Throat: Oropharynx is clear and moist.  Eyes: Pupils are equal, round, and reactive to light.  Neck: Normal range of motion.  Cardiovascular: Normal rate and regular rhythm.   Pulmonary/Chest: Effort normal and breath sounds normal.  Abdominal: Soft. She exhibits no distension. There is no tenderness.  Musculoskeletal: Normal range of motion. She exhibits no edema and no tenderness.  Lymphadenopathy:    She has no cervical adenopathy.  Neurological: She is alert.  Skin: Skin is warm. No rash noted. She is not diaphoretic. No erythema.    ED Course  Procedures (including critical care time) Labs Review Labs Reviewed  CBC WITH DIFFERENTIAL  BASIC METABOLIC PANEL  URINALYSIS, ROUTINE W REFLEX MICROSCOPIC   Imaging Review Dg Chest 2 View  12/01/2013   CLINICAL DATA:  Flu symptoms.  EXAM: CHEST  2 VIEW  COMPARISON:  DG CHEST 2 VIEW dated 11/24/2013  FINDINGS: Hyperinflation. Degraded lateral view secondary to patient arm  position and mild obliquity. The AP view is degraded by apical lordotic positioning.  Cardiomegaly with atherosclerosis in the transverse aorta. No definite pleural fluid. No pneumothorax. Chronic interstitial thickening, without lobar consolidation. A left infrahilar calcified granuloma is again identified.  IMPRESSION: Cardiomegaly, without evidence of pneumonia.  Decreased sensitivity and specificity exam due to technique related factors, as described above.   Electronically Signed   By: Jeronimo Greaves M.D.   On: 12/01/2013 01:23    EKG Interpretation   None       MDM  No diagnosis found.  Patient does not appear ill or unstable Patient's potassium was slightly, low, she's been supplemented.  Discharge him with diet recommendations and for her followup with her primary care physician in one to 2 weeks to have her repeat potassium level drawn   Arman Filter, NP 12/01/13 0410

## 2013-12-01 NOTE — ED Notes (Signed)
Pt continued to refuse to want to leave asking to speak with the dr. Effie BerkshireExplained to pt that the np had already spoken with her and her and the dr agreed that she was ok to discharge. Pt started to become rude toward nurse and tech. Pt ambulated alone from bed to wheelchair. Pt escorted out by wheelchair by tech.

## 2013-12-01 NOTE — Telephone Encounter (Signed)
[facility] Triage Call Report Triage Record Num: [id number] Operator: Gypsy DecantLeigh Martin Patient Name: Paula [name] Call Date & Time: 11/30/2013 11:01:03PM Patient Phone: [phone] PCP: Rene PaciValerie Leschber Patient Gender: Female PCP Fax : [phone] Patient DOB: 1927/05/21 [facility] Name: [facility] Reason for Call: Caller: [name]; PCP: Rene PaciLeschber, Valerie (Adults only); CB#: [phone]; Call regarding Fever; Onset 11/30/2013; All over body aches. Husband cannot read themometer. She is coughing and congested. Urgent s/sx of Flu-Like symptoms, Flu-like symptoms associated with a cough and brathing that is becoming more difficult identified. ER advised. Care advice given. Protocol(s) Used: Flu-Like Symptoms Recommended Outcome per Protocol: Call Provider within 4 Hours Reason for Outcome: Flu-like symptoms associated with a cough and breathing that is becoming more difficult Care Advice: ~ SYMPTOM / CONDITION MANAGEMENT ~ IMMEDIATE ACTION ~ CAUTIONS See a provider immediately or go to the Emergency Department if having chest pain with breathing, change in level of consciousness, new seizure, vomiting and unable to keep fluids down for 8 hours or more, or has not urinated for 8 or more hours. ~ Antiviral medications - Prescribed medications are available in pills, liquids or inhaled powder that help prevent or lessen symptoms of viral illnesses. Antivirals are usually given to persons at a higher risk for flu-related complications or who are very ill. Most healthy people do not need to be treated with antiviral drugs. - Recommended medications for this season: oseltamivir (Tamiflu) and zanamivir (Relenza). - Work best when started within the first two days of symptoms. - Speak with your provider as soon as possible if exposed to the flu or if you have new symptoms of the flu

## 2013-12-01 NOTE — ED Notes (Signed)
Pt request to speak with the dr/np because she wanted to stay in the hospital. Explained to pt that they have her ready for discharge and that they felt she was ok to go home. Pt continues to explain that she is "sick" but unable to list any symptoms that she is having. Explained to pt that I would go and talk to the dr and let them know you want to speak to them. Dondra SpryGail, NP in speaking with pt.

## 2013-12-01 NOTE — ED Notes (Signed)
Bed: YQ65WA23 Expected date:  Expected time:  Means of arrival:  Comments: 80 year old; flu like symptoms

## 2013-12-01 NOTE — ED Provider Notes (Signed)
Medical screening examination/treatment/procedure(s) were conducted as a shared visit with non-physician practitioner(s) and myself.  I personally evaluated the patient during the encounter.  EKG Interpretation   None       Well appearing. Dc home with pcp follow up. Likely viral process  Lyanne CoKevin M Jacia Sickman, MD 12/01/13 [phone]

## 2013-12-08 ENCOUNTER — Ambulatory Visit (INDEPENDENT_AMBULATORY_CARE_PROVIDER_SITE_OTHER): Payer: Medicare Other | Admitting: Internal Medicine

## 2013-12-08 ENCOUNTER — Encounter: Payer: Self-pay | Admitting: Internal Medicine

## 2013-12-08 VITALS — BP 120/72 | HR 80 | Temp 99.1°F | Wt 179.8 lb

## 2013-12-08 DIAGNOSIS — R110 Nausea: Secondary | ICD-10-CM | POA: Insufficient documentation

## 2013-12-08 DIAGNOSIS — F411 Generalized anxiety disorder: Secondary | ICD-10-CM

## 2013-12-08 DIAGNOSIS — E119 Type 2 diabetes mellitus without complications: Secondary | ICD-10-CM

## 2013-12-08 DIAGNOSIS — F419 Anxiety disorder, unspecified: Secondary | ICD-10-CM

## 2013-12-08 MED ORDER — METFORMIN HCL ER 500 MG PO TB24: 250.0000 mg | ORAL_TABLET | Freq: Every day | ORAL | Status: DC

## 2013-12-08 NOTE — Assessment & Plan Note (Signed)
Diet controlled until 02/2011 when a1c>7 - started metformin xr 500 qd Reduce dose now due to nausea CBGs exacerbated by frequent steroids for COPD  On ARB, reports intol to statin years ago  Follows with optho - rankin Check a1c q6-6138mo, adjust as needed  Lab Results  Component Value Date   HGBA1C 7.3* 11/07/2013

## 2013-12-08 NOTE — Patient Instructions (Addendum)
It was good to see you today.  We have reviewed your prior records including labs and tests today  Medications reviewed and updated  Decrease metformin to 1/2 pill daily - take a night time  no other changes recommended at this time.    Nausea, Adult Nausea means you feel sick to your stomach or need to throw up (vomit). It may be a sign of a more serious problem. If nausea gets worse, you may throw up. If you throw up a lot, you may lose too much body fluid (dehydration). HOME CARE   Get plenty of rest.  Ask your doctor how to replace body fluid losses (rehydrate).  Eat small amounts of food. Sip liquids more often.  Take all medicines as told by your doctor. GET HELP RIGHT AWAY IF:  You have a fever.  You pass out (faint).  You keep throwing up or have blood in your throw up.  You are very weak, have dry lips or a dry mouth, or you are very thirsty (dehydrated).  You have dark or bloody poop (stool).  You have very bad chest or belly (abdominal) pain.  You do not get better after 2 days, or you get worse.  You have a headache. MAKE SURE YOU:  Understand these instructions.  Will watch your condition.  Will get help right away if you are not doing well or get worse. Document Released: 10/17/2011 Document Revised: 01/20/2012 Document Reviewed: 10/17/2011 [facility] Patient Information 2014 [place], [place].

## 2013-12-08 NOTE — Assessment & Plan Note (Signed)
Significant chronic anxiety (spouse and pt), likely contributing to chronic "unwell" symptoms Review and reassurance provided to patient and spouse today Continue same sertraline  Last increased BZ to TID 02/2013 - refill done S/p counseling with gutterman 02/2013 x 3 OV, to follow up prn

## 2013-12-08 NOTE — Assessment & Plan Note (Signed)
This has been a chronic recurrent problem for her Reviewed prior CT and imaging - also recent labs Exam benign

## 2013-12-08 NOTE — Progress Notes (Signed)
Pre-visit discussion using our clinic review tool. No additional management support is needed unless otherwise documented below in the visit note.  

## 2013-12-08 NOTE — Progress Notes (Signed)
Subjective:    Patient ID: Paula [name], female    DOB: 12/22/1926, 80 y.o.   MRN: [id number]  HPI  Here for followup -  Chronic nausea, facial spasm, back pain, meds   Past Medical History  Diagnosis Date  . Depression   . Ejection fraction     EF 40%, echo, November, 2012  / in improved, EF 60%, echo, December, 2012  . Contrast media allergy     Patient feels poorly with contrast  . Status post AAA (abdominal aortic aneurysm) repair     Surgical repair, Dr. Hart RochesterLawson, December 27, 2011  . Pre-syncope     vasovagal w/ heart block  . Parotid mass     has refused further eval 10/2011  . GERD (gastroesophageal reflux disease)   . Anxiety   . Facial tic     L sided spasms - Botox trial summer 2013, ?effective  . Macular degeneration   . AAA (abdominal aortic aneurysm)     s/p repair 12/2011  . Congestive heart failure     NL EF 10/2011 echo   . Diabetes mellitus type II, controlled   . Hypothyroidism   . Sinus bradycardia 09/19/2011    Occurring simultaneously with complete heart block   . Hypertension   . COPD (chronic obstructive pulmonary disease)   . Abdominal spasms     hemifacial-treated w/ botox xeomin -Dr Terrace ArabiaYan  . Pneumonia     Review of Systems  Constitutional: Positive for fatigue (chronic). Negative for fever, activity change, appetite change and unexpected weight change.  Respiratory: Positive for cough (chronic). Negative for chest tightness and shortness of breath.   Cardiovascular: Negative for chest pain, palpitations and leg swelling.  Gastrointestinal: Positive for nausea. Negative for vomiting, abdominal pain, diarrhea, constipation and abdominal distention.  Neurological: Negative for dizziness, syncope and headaches.       Objective:   Physical Exam BP 120/72  Pulse 80  Temp(Src) 99.1 F (37.3 C) (Oral)  Wt 179 lb 12.8 oz (81.557 kg)  SpO2 90% Wt Readings from Last 3 Encounters:  12/08/13 179 lb 12.8 oz (81.557 kg)  12/01/13 176 lb (79.833 kg)   11/29/13 177 lb 6.4 oz (80.468 kg)   Constitutional: She appears well-developed and well-nourished. No distress. Spouse at side  Neck: Normal range of motion. Neck supple. No JVD present. No thyromegaly present.  Cardiovascular: Normal rate, regular rhythm and normal heart sounds.  No murmur heard. No BLE edema. Pulmonary/Chest: Effort normal and breath sounds normal. No respiratory distress. She has no wheezes.  Abdomen: SNTND, +BS Neurological: She is alert and oriented to person, place, and time. No cranial nerve deficit. Coordination, balance, strength, speech and gait are normal.  Skin: Skin is warm and dry. No rash noted. No erythema.  Psychiatric: She has a perseverating and dysphoric/mildly anxious mood and affect. Her behavior is normal. Judgment and thought minimally impaired.   Lab Results  Component Value Date   WBC 9.5 12/01/2013   HGB 10.8* 12/01/2013   HCT 33.0* 12/01/2013   PLT 256 12/01/2013   GLUCOSE 147* 12/01/2013   CHOL 153 11/07/2013   TRIG 282* 11/07/2013   HDL 28* 11/07/2013   LDLDIRECT 90.6 02/10/2013   LDLCALC 69 11/07/2013   ALT 16 11/06/2013   AST 17 11/06/2013   NA 142 12/01/2013   K 3.2* 12/01/2013   CL 102 12/01/2013   CREATININE 0.97 12/01/2013   BUN 15 12/01/2013   CO2 27 12/01/2013   TSH 0.359  08/22/2013   INR 1.03 11/06/2013   HGBA1C 7.3* 11/07/2013       Assessment & Plan:   See problem list. Medications and labs reviewed today.

## 2013-12-10 ENCOUNTER — Telehealth: Payer: Self-pay

## 2013-12-10 NOTE — Telephone Encounter (Signed)
Relevant patient education mailed to patient.  

## 2013-12-11 ENCOUNTER — Other Ambulatory Visit: Payer: Self-pay | Admitting: Internal Medicine

## 2013-12-14 ENCOUNTER — Ambulatory Visit: Payer: Medicare Other | Admitting: Internal Medicine

## 2013-12-16 ENCOUNTER — Ambulatory Visit
Admission: RE | Admit: 2013-12-16 | Discharge: 2013-12-16 | Disposition: A | Discharge status: Home / Self Care | Payer: Medicare Other | Source: Ambulatory Visit

## 2013-12-16 DIAGNOSIS — Z1231 Encounter for screening mammogram for malignant neoplasm of breast: Secondary | ICD-10-CM

## 2013-12-17 ENCOUNTER — Ambulatory Visit (INDEPENDENT_AMBULATORY_CARE_PROVIDER_SITE_OTHER): Payer: Medicare Other | Admitting: Internal Medicine

## 2013-12-17 ENCOUNTER — Encounter: Payer: Self-pay | Admitting: Internal Medicine

## 2013-12-17 VITALS — BP 112/72 | HR 78 | Temp 98.5°F | Wt 179.2 lb

## 2013-12-17 DIAGNOSIS — R05 Cough: Secondary | ICD-10-CM

## 2013-12-17 DIAGNOSIS — F419 Anxiety disorder, unspecified: Secondary | ICD-10-CM

## 2013-12-17 DIAGNOSIS — R059 Cough, unspecified: Secondary | ICD-10-CM

## 2013-12-17 DIAGNOSIS — F411 Generalized anxiety disorder: Secondary | ICD-10-CM

## 2013-12-17 MED ORDER — ALPRAZOLAM 0.5 MG PO TABS: 0.5000 mg | ORAL_TABLET | Freq: Three times a day (TID) | ORAL | Status: DC

## 2013-12-17 NOTE — Progress Notes (Signed)
Subjective:    Patient ID: Paula [name], female    DOB: Aug 27, 1927, 80 y.o.   MRN: [id number]  URI  The current episode started yesterday. The problem has been rapidly improving. There has been no fever. Associated symptoms include coughing. Pertinent negatives include no chest pain, diarrhea, headaches, rhinorrhea, sinus pain, sneezing, sore throat or swollen glands. Treatments tried: PPI - improved. The treatment provided significant relief.    Patient is here for "cold symptoms": cough, worse at night - improved with PPI  Also reviewed chronic medical issues and interval medical events  Past Medical History  Diagnosis Date  . Depression   . Ejection fraction     EF 40%, echo, November, 2012  / in improved, EF 60%, echo, December, 2012  . Contrast media allergy     Patient feels poorly with contrast  . Status post AAA (abdominal aortic aneurysm) repair     Surgical repair, Dr. Hart Rochester, December 27, 2011  . Pre-syncope     vasovagal w/ heart block  . Parotid mass     has refused further eval 10/2011  . GERD (gastroesophageal reflux disease)   . Anxiety   . Facial tic     L sided spasms - Botox trial summer 2013, ?effective  . Macular degeneration   . AAA (abdominal aortic aneurysm)     s/p repair 12/2011  . Congestive heart failure     NL EF 10/2011 echo   . Diabetes mellitus type II, controlled   . Hypothyroidism   . Sinus bradycardia 09/19/2011    Occurring simultaneously with complete heart block   . Hypertension   . COPD (chronic obstructive pulmonary disease)   . Facial spasm     L hemifacial-treated w/ botox xeomin -Dr Terrace Arabia    Review of Systems  Constitutional: Negative for fever and fatigue.  HENT: Negative for rhinorrhea, sneezing and sore throat.   Respiratory: Positive for cough. Negative for shortness of breath.   Cardiovascular: Negative for chest pain and leg swelling.  Gastrointestinal: Negative for diarrhea.  Neurological: Negative for headaches.    Psychiatric/Behavioral: Positive for dysphoric mood. The patient is nervous/anxious.        Objective:   Physical Exam  BP 112/72  Pulse 78  Temp(Src) 98.5 F (36.9 C) (Oral)  Wt 179 lb 3.2 oz (81.285 kg)  SpO2 94% Wt Readings from Last 3 Encounters:  12/17/13 179 lb 3.2 oz (81.285 kg)  12/08/13 179 lb 12.8 oz (81.557 kg)  12/01/13 176 lb (79.833 kg)   Constitutional: Paula [name] appears well-developed and well-nourished. No distress. Spouse at side  Neck: Normal range of motion. Neck supple. No JVD present. No thyromegaly present.  Cardiovascular: Normal rate, regular rhythm and normal heart sounds.  No murmur heard. No BLE edema. Pulmonary/Chest: Effort normal and breath sounds normal. No respiratory distress. Paula [name] has no wheezes.  Skin: Skin is warm and dry. No rash noted. No erythema.  Psychiatric: Paula [name] has a perseverating and dysphoric/mildly anxious mood and affect. Her behavior is normal. Judgment and thought minimally impaired.    Lab Results  Component Value Date   WBC 9.5 12/01/2013   HGB 10.8* 12/01/2013   HCT 33.0* 12/01/2013   PLT 256 12/01/2013   GLUCOSE 147* 12/01/2013   CHOL 153 11/07/2013   TRIG 282* 11/07/2013   HDL 28* 11/07/2013   LDLDIRECT 90.6 02/10/2013   LDLCALC 69 11/07/2013   ALT 16 11/06/2013   AST 17 11/06/2013   NA 142 12/01/2013  K 3.2* 12/01/2013   CL 102 12/01/2013   CREATININE 0.97 12/01/2013   BUN 15 12/01/2013   CO2 27 12/01/2013   TSH 0.359 08/22/2013   INR 1.03 11/06/2013   HGBA1C 7.3* 11/07/2013    No results found.     Assessment & Plan:   Cough -no evidence for infection. Symptoms improved with treatment of GERD. Continue same. Call if worse or recurrent symptoms  Problem List Items Addressed This Visit   Anxiety     Significant chronic anxiety (spouse and pt), likely contributing to chronic "unwell" symptoms Review and reassurance provided to patient and spouse today Continue same sertraline  Last increased BZ to TID 02/2013 -  Now  feels 1mg  dose "too strong" - will reduce to 0.5mg  (but continue scheduled TID) S/p counseling with gutterman 02/2013 x 3 OV, to follow up prn     Relevant Medications      ALPRAZolam Prudy Feeler(XANAX) tablet    Other Visit Diagnoses   Cough    -  Primary

## 2013-12-17 NOTE — Patient Instructions (Addendum)
It was good to see you today.  We have reviewed your prior records including labs and tests today  Medications reviewed and updated - reduce xanax to 0.5mg  3x/day -no other changes recommended at this time.  If you develop worsening symptoms or fever, call and we can reconsider antibiotics, but it does not appear necessary to use antibiotics at this time.  Cough, Adult  A cough is a reflex that helps clear your throat and airways. It can help heal the body or may be a reaction to an irritated airway. A cough may only last 2 or 3 weeks (acute) or may last more than 8 weeks (chronic).  CAUSES Acute cough:  Viral or bacterial infections. Chronic cough:  Infections.  Allergies.  Asthma.  Post-nasal drip.  Smoking.  Heartburn or acid reflux.  Some medicines.  Chronic lung problems (COPD).  Cancer. SYMPTOMS   Cough.  Fever.  Chest pain.  Increased breathing rate.  High-pitched whistling sound when breathing (wheezing).  Colored mucus that you cough up (sputum). TREATMENT   A bacterial cough may be treated with antibiotic medicine.  A viral cough must run its course and will not respond to antibiotics.  Your caregiver may recommend other treatments if you have a chronic cough. HOME CARE INSTRUCTIONS   Only take over-the-counter or prescription medicines for pain, discomfort, or fever as directed by your caregiver. Use cough suppressants only as directed by your caregiver.  Use a cold steam vaporizer or humidifier in your bedroom or home to help loosen secretions.  Sleep in a semi-upright position if your cough is worse at night.  Rest as needed.  Stop smoking if you smoke. SEEK IMMEDIATE MEDICAL CARE IF:   You have pus in your sputum.  Your cough starts to worsen.  You cannot control your cough with suppressants and are losing sleep.  You begin coughing up blood.  You have difficulty breathing.  You develop pain which is getting worse or is  uncontrolled with medicine.  You have a fever. MAKE SURE YOU:   Understand these instructions.  Will watch your condition.  Will get help right away if you are not doing well or get worse. Document Released: 04/26/2011 Document Revised: 01/20/2012 Document Reviewed: 04/26/2011 [facility] Patient Information 2014 [place], [place].

## 2013-12-17 NOTE — Progress Notes (Signed)
Pre-visit discussion using our clinic review tool. No additional management support is needed unless otherwise documented below in the visit note.  

## 2013-12-17 NOTE — Assessment & Plan Note (Addendum)
Significant chronic anxiety (spouse and pt), likely contributing to chronic "unwell" symptoms Review and reassurance provided to patient and spouse today Continue same sertraline  Last increased BZ to TID 02/2013 -  Now feels 1mg  dose "too strong" - will reduce to 0.5mg  (but continue scheduled TID) S/p counseling with gutterman 02/2013 x 3 OV, to follow up prn

## 2013-12-20 ENCOUNTER — Telehealth: Payer: Self-pay | Admitting: *Deleted

## 2013-12-20 NOTE — Telephone Encounter (Signed)
Returning Paula [name]'s earlier phone call c/o of chronic nausea.  States she stays nauseated for 2-3 days at the time.  Advised her to contact Dr. Rene PaciValerie Leschber (internal medicine) who treats her chronic nausea, diabetes mellitus,anxiety and other chronic health conditions.  Emphasized that Dr. Felicity CoyerLeschber would want to know of any recent changes in her status.  Mrs. Payeur verbalized understanding.

## 2013-12-24 ENCOUNTER — Ambulatory Visit (INDEPENDENT_AMBULATORY_CARE_PROVIDER_SITE_OTHER): Payer: Medicare Other | Admitting: Psychology

## 2013-12-24 DIAGNOSIS — F4320 Adjustment disorder, unspecified: Secondary | ICD-10-CM

## 2014-01-03 ENCOUNTER — Ambulatory Visit (INDEPENDENT_AMBULATORY_CARE_PROVIDER_SITE_OTHER): Payer: Medicare Other | Admitting: Internal Medicine

## 2014-01-03 ENCOUNTER — Ambulatory Visit (INDEPENDENT_AMBULATORY_CARE_PROVIDER_SITE_OTHER)
Admission: RE | Admit: 2014-01-03 | Discharge: 2014-01-03 | Disposition: A | Discharge status: Home / Self Care | Payer: Medicare Other | Source: Ambulatory Visit | Attending: Internal Medicine | Admitting: Internal Medicine

## 2014-01-03 ENCOUNTER — Telehealth: Payer: Self-pay | Admitting: *Deleted

## 2014-01-03 ENCOUNTER — Telehealth: Payer: Self-pay

## 2014-01-03 ENCOUNTER — Other Ambulatory Visit (INDEPENDENT_AMBULATORY_CARE_PROVIDER_SITE_OTHER): Payer: Medicare Other

## 2014-01-03 ENCOUNTER — Encounter: Payer: Self-pay | Admitting: Internal Medicine

## 2014-01-03 VITALS — BP 120/80 | HR 88 | Temp 98.5°F | Resp 16 | Ht 65.0 in | Wt 178.0 lb

## 2014-01-03 DIAGNOSIS — R071 Chest pain on breathing: Secondary | ICD-10-CM

## 2014-01-03 DIAGNOSIS — R0781 Pleurodynia: Secondary | ICD-10-CM

## 2014-01-03 LAB — CBC WITH DIFFERENTIAL/PLATELET
BASOS PCT: 0.4 % (ref 0.0–3.0)
Basophils Absolute: 0.1 10*3/uL (ref 0.0–0.1)
Eosinophils Absolute: 0.7 10*3/uL (ref 0.0–0.7)
Eosinophils Relative: 4.6 % (ref 0.0–5.0)
HEMATOCRIT: 35.8 % — AB (ref 36.0–46.0)
Hemoglobin: 11.6 g/dL — ABNORMAL LOW (ref 12.0–15.0)
Lymphocytes Relative: 18.6 % (ref 12.0–46.0)
Lymphs Abs: 2.8 10*3/uL (ref 0.7–4.0)
MCHC: 32.5 g/dL (ref 30.0–36.0)
MCV: 104 fl — AB (ref 78.0–100.0)
MONO ABS: 1 10*3/uL (ref 0.1–1.0)
Monocytes Relative: 6.6 % (ref 3.0–12.0)
NEUTROS PCT: 69.8 % (ref 43.0–77.0)
Neutro Abs: 10.3 10*3/uL — ABNORMAL HIGH (ref 1.4–7.7)
Platelets: 298 10*3/uL (ref 150.0–400.0)
RBC: 3.44 Mil/uL — AB (ref 3.87–5.11)
RDW: 14.3 % (ref 11.5–14.6)
WBC: 14.8 10*3/uL — AB (ref 4.5–10.5)

## 2014-01-03 LAB — CARDIAC PANEL
CK MB: 1.6 ng/mL (ref 0.3–4.0)
Relative Index: 4.4 calc — ABNORMAL HIGH (ref 0.0–2.5)
Total CK: 36 U/L (ref 7–177)

## 2014-01-03 LAB — TROPONIN I: TROPONIN I: 0.02 ng/mL (ref <0.06)

## 2014-01-03 LAB — D-DIMER, QUANTITATIVE (NOT AT ARMC): D DIMER QUANT: 0.88 ug{FEU}/mL — AB (ref 0.00–0.48)

## 2014-01-03 NOTE — Progress Notes (Signed)
Subjective:    Patient ID: Paula [name], female    DOB: 25-Jun-1927, 80 y.o.   MRN: [id number]  HPI Comments: "It hurst when I breath". She comes in today complaining that there is pain over both posterior ribs and around her thoracic spine area.  Back Pain This is a new problem. The current episode started in the past 7 days. The problem occurs intermittently. The problem is unchanged. The pain is present in the thoracic spine. The quality of the pain is described as aching. The pain does not radiate. The pain is at a severity of 1/10. The pain is mild. The pain is worse during the day. The symptoms are aggravated by bending and position. Pertinent negatives include no abdominal pain, bladder incontinence, bowel incontinence, chest pain, dysuria, fever, headaches, leg pain, numbness, paresis, paresthesias, pelvic pain, perianal numbness, tingling, weakness or weight loss. She has tried nothing for the symptoms. The treatment provided no relief.      Review of Systems  Constitutional: Negative.  Negative for fever, chills, weight loss, diaphoresis, appetite change and fatigue.  HENT: Negative.   Eyes: Negative.   Respiratory: Negative.  Negative for apnea, cough, choking, chest tightness, shortness of breath, wheezing and stridor.   Cardiovascular: Negative.  Negative for chest pain, palpitations and leg swelling.  Gastrointestinal: Negative.  Negative for nausea, abdominal pain, constipation, blood in stool and bowel incontinence.  Endocrine: Negative.   Genitourinary: Negative.  Negative for bladder incontinence, dysuria and pelvic pain.  Musculoskeletal: Positive for back pain. Negative for arthralgias, gait problem, joint swelling, myalgias, neck pain and neck stiffness.  Skin: Negative.   Allergic/Immunologic: Negative.   Neurological: Negative.  Negative for tingling, weakness, numbness, headaches and paresthesias.  Hematological: Negative.  Negative for adenopathy. Does not  bruise/bleed easily.  Psychiatric/Behavioral: Positive for confusion, dysphoric mood and decreased concentration. Negative for suicidal ideas, hallucinations, behavioral problems, sleep disturbance, self-injury and agitation. The patient is nervous/anxious. The patient is not hyperactive.        Objective:   Physical Exam  Vitals reviewed. Constitutional: She is oriented to person, place, and time. She appears well-developed and well-nourished.  Non-toxic appearance. She does not have a sickly appearance. She does not appear ill. No distress.  HENT:  Head: Normocephalic and atraumatic.  Mouth/Throat: Oropharynx is clear and moist.  Eyes: Conjunctivae are normal. Right eye exhibits no discharge. Left eye exhibits no discharge. No scleral icterus.  Neck: Normal range of motion. Neck supple. No JVD present. No tracheal deviation present. No thyromegaly present.  Cardiovascular: Normal rate, regular rhythm, normal heart sounds and intact distal pulses.  Exam reveals no gallop and no friction rub.   No murmur heard. Pulmonary/Chest: Effort normal and breath sounds normal. No accessory muscle usage or stridor. Not tachypneic. No respiratory distress. She has no decreased breath sounds. She has no wheezes. She has no rhonchi. She has no rales. Chest wall is not dull to percussion. She exhibits no mass, no tenderness, no bony tenderness, no laceration, no crepitus, no edema, no deformity, no swelling and no retraction.  Abdominal: Soft. Bowel sounds are normal. She exhibits no distension and no mass. There is no tenderness. There is no rebound and no guarding.  Musculoskeletal: Normal range of motion. She exhibits no edema and no tenderness.       Thoracic back: Normal. She exhibits normal range of motion, no tenderness, no bony tenderness, no swelling, no edema, no deformity, no laceration, no pain, no spasm and normal pulse.  Lymphadenopathy:    She has no cervical adenopathy.  Neurological: She is  oriented to person, place, and time.  Skin: Skin is warm and dry. No rash noted. She is not diaphoretic. No erythema. No pallor.  Psychiatric: She has a normal mood and affect. Her behavior is normal. Judgment and thought content normal.     Lab Results  Component Value Date   WBC 9.5 12/01/2013   HGB 10.8* 12/01/2013   HCT 33.0* 12/01/2013   PLT 256 12/01/2013   GLUCOSE 147* 12/01/2013   CHOL 153 11/07/2013   TRIG 282* 11/07/2013   HDL 28* 11/07/2013   LDLDIRECT 90.6 02/10/2013   LDLCALC 69 11/07/2013   ALT 16 11/06/2013   AST 17 11/06/2013   NA 142 12/01/2013   K 3.2* 12/01/2013   CL 102 12/01/2013   CREATININE 0.97 12/01/2013   BUN 15 12/01/2013   CO2 27 12/01/2013   TSH 0.359 08/22/2013   INR 1.03 11/06/2013   HGBA1C 7.3* 11/07/2013       Assessment & Plan:

## 2014-01-03 NOTE — Patient Instructions (Signed)
Pleurisy  Pleurisy is an inflammation and swelling of the lining of the lungs (pleura). Because of this inflammation, it hurts to breathe. It can be aggravated by coughing, laughing, or deep breathing. Pleurisy is often caused by an underlying infection or disease.   HOME CARE INSTRUCTIONS   Monitor your pleurisy for any changes. The following actions may help to alleviate any discomfort you are experiencing:   Medicine may help with pain. Only take over-the-counter or prescription medicines for pain, discomfort, or fever as directed by your health care provider.   Only take antibiotic medicine as directed. Make sure to finish it even if you start to feel better.  SEEK MEDICAL CARE IF:    Your pain is not controlled with medicine or is increasing.   You have an increase in pus-like (purulent) secretions brought up with coughing.  SEEK IMMEDIATE MEDICAL CARE IF:    You have blue or dark lips, fingernails, or toenails.   You are coughing up blood.   You have increased difficulty breathing.   You have continuing pain unrelieved by medicine or pain lasting more than 1 week.   You have pain that radiates into your neck, arms, or jaw.   You develop increased shortness of breath or wheezing.   You develop a fever, rash, vomiting, fainting, or other serious symptoms.  MAKE SURE YOU:   Understand these instructions.    Will watch your condition.    Will get help right away if you are not doing well or get worse.    Document Released: 10/28/2005 Document Revised: 06/30/2013 Document Reviewed: 04/11/2013  Exit[employer] Patient Information 2014 [employer].

## 2014-01-03 NOTE — Telephone Encounter (Signed)
[facility] called to report a D-Dimer of 0.88. Thanks

## 2014-01-03 NOTE — Telephone Encounter (Signed)
Spouse phoned requesting results from labs and xrays performed today.  CB# [phone]

## 2014-01-04 NOTE — Telephone Encounter (Signed)
Spouse and patient phoned again for results.  Consults Dr. Margo AyeJ, stated that results from diagnostics yesterday were WNL & showed no acute findings.  Reviewed office notes from OV yesterday and reviewed MD's instructions for patients.

## 2014-01-04 NOTE — Telephone Encounter (Signed)
Patient is still on the phone, wanting to know about the abx you discussed prescribing with you.  None showing up on MAR.  Please advise.  CB# [phone]

## 2014-01-04 NOTE — Telephone Encounter (Signed)
Phoned patient back and relayed MD's response.

## 2014-01-04 NOTE — Assessment & Plan Note (Signed)
Exam and cxr are normal Troponin and cardiac enzymes are WNL I think that this is MS

## 2014-01-04 NOTE — Telephone Encounter (Signed)
Antibiotics are not indicated

## 2014-01-07 ENCOUNTER — Encounter: Payer: Self-pay | Admitting: Internal Medicine

## 2014-01-07 ENCOUNTER — Ambulatory Visit (INDEPENDENT_AMBULATORY_CARE_PROVIDER_SITE_OTHER): Payer: Medicare Other | Admitting: Internal Medicine

## 2014-01-07 VITALS — BP 124/72 | HR 72 | Temp 99.5°F | Wt 178.2 lb

## 2014-01-07 DIAGNOSIS — G9332 Myalgic encephalomyelitis/chronic fatigue syndrome: Secondary | ICD-10-CM

## 2014-01-07 DIAGNOSIS — G514 Facial myokymia: Secondary | ICD-10-CM

## 2014-01-07 DIAGNOSIS — R5382 Chronic fatigue, unspecified: Secondary | ICD-10-CM | POA: Insufficient documentation

## 2014-01-07 DIAGNOSIS — G518 Other disorders of facial nerve: Secondary | ICD-10-CM

## 2014-01-07 DIAGNOSIS — R0781 Pleurodynia: Secondary | ICD-10-CM

## 2014-01-07 DIAGNOSIS — R110 Nausea: Secondary | ICD-10-CM

## 2014-01-07 DIAGNOSIS — R071 Chest pain on breathing: Secondary | ICD-10-CM

## 2014-01-07 NOTE — Progress Notes (Signed)
Pre-visit discussion using our clinic review tool. No additional management support is needed unless otherwise documented below in the visit note.  

## 2014-01-07 NOTE — Assessment & Plan Note (Signed)
This has been a chronic recurrent problem for her Reviewed prior CT and imaging - also recent labs Exam benign - symptomatic care as needed

## 2014-01-07 NOTE — Assessment & Plan Note (Signed)
eval and tx ongoing by neuro - but pt ? benefit of botox injections q6254mo -  hosp for "severe" spasm L face 10/2013 - no CVA or TIA Advised follow up for repeat injection as needed (neuro feels psychogenic) Continue baclofen as ongoing - increased dose 10/24/13

## 2014-01-07 NOTE — Assessment & Plan Note (Signed)
Significant and chronic "sick" symptoms, overlap with anxiety and cognitive impairment (spouse and pt), likely contributing to chronic "unwell" symptoms  Review and reassurance provided to patient and spouse today Will [facility] at family request (per newspaper article on CFS) Continue same sertraline and scheduled BZ S/p counseling with gutterman 02/2013 x 3 OV, to follow up prn

## 2014-01-07 NOTE — Assessment & Plan Note (Signed)
Recent eval with my partner for same CXR negative 01/03/14 recommended scheduled Tyenol

## 2014-01-07 NOTE — Progress Notes (Signed)
Subjective:    Patient ID: Paula [name], female    DOB: 12-17-1926, 80 y.o.   MRN: [id number]  HPI  Patient is here for follow up -Reviewed chronic medical issues and interval medical events  Patient and spouse request referral to [facility]ology at [facility] for eval and tx of CFS (per newpaper article on chronic fatigue by Dr Merlene MorseEllen Wright Clayton at [facility])  Past Medical History  Diagnosis Date  . Depression   . Ejection fraction     EF 40%, echo, November, 2012  / in improved, EF 60%, echo, December, 2012  . Contrast media allergy     Patient feels poorly with contrast  . Status post AAA (abdominal aortic aneurysm) repair     Surgical repair, Dr. Hart RochesterLawson, December 27, 2011  . Pre-syncope     vasovagal w/ heart block  . Parotid mass     has refused further eval 10/2011  . GERD (gastroesophageal reflux disease)   . Anxiety   . Facial tic     L sided spasms - Botox trial summer 2013, ?effective  . Macular degeneration   . AAA (abdominal aortic aneurysm)     s/p repair 12/2011  . Congestive heart failure     NL EF 10/2011 echo   . Diabetes mellitus type II, controlled   . Hypothyroidism   . Sinus bradycardia 09/19/2011    Occurring simultaneously with complete heart block   . Hypertension   . COPD (chronic obstructive pulmonary disease)   . 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 Negative for fever and unexpected weight change.  Respiratory: Negative for cough and shortness of breath.   Cardiovascular: Negative for chest pain and leg swelling.       Objective:   Physical Exam  BP 124/72  Pulse 72  Temp(Src) 99.5 F (37.5 C) (Oral)  Wt 178 lb 3.2 oz (80.831 kg)  SpO2 93% Wt Readings from Last 3 Encounters:  01/07/14 178 lb 3.2 oz (80.831 kg)  01/03/14 178 lb (80.74 kg)  12/17/13 179 lb 3.2 oz (81.285 kg)    Constitutional: She appears well-developed and well-nourished. No  distress. spouse at side Neck: Normal range of motion. Neck supple. No JVD present. No thyromegaly present.  Cardiovascular: Normal rate, regular rhythm and normal heart sounds.  No murmur heard. No BLE edema. Pulmonary/Chest: Effort normal and breath sounds normal. No respiratory distress. She has no wheezes.  Abdomen: SNTND, +BS Psychiatric: She has a normal mood and affect. Her behavior is normal. Judgment and thought content normal.   Lab Results  Component Value Date   WBC 14.8* 01/03/2014   HGB 11.6* 01/03/2014   HCT 35.8* 01/03/2014   PLT 298.0 01/03/2014   GLUCOSE 147* 12/01/2013   CHOL 153 11/07/2013   TRIG 282* 11/07/2013   HDL 28* 11/07/2013   LDLDIRECT 90.6 02/10/2013   LDLCALC 69 11/07/2013   ALT 16 11/06/2013   AST 17 11/06/2013   NA 142 12/01/2013   K 3.2* 12/01/2013   CL 102 12/01/2013   CREATININE 0.97 12/01/2013   BUN 15 12/01/2013   CO2 27 12/01/2013   TSH 0.359 08/22/2013   INR 1.03 11/06/2013   HGBA1C 7.3* 11/07/2013    Dg Chest 2 View  01/03/2014   CLINICAL DATA:  Chest, back pain, wheezing, former smoker, history COPD  EXAM: CHEST  2 VIEW  COMPARISON:  DG CHEST  2 VIEW dated 12/01/2013; DG CHEST 2 VIEW dated 11/22/2013; DG CHEST 2 VIEW dated 08/23/2013; DG CHEST 2 VIEW dated 05/30/2013; DG CHEST 2 VIEW dated 04/16/2013; CT CHEST W/O CM dated 05/17/2013  FINDINGS: Grossly unchanged enlarged cardiac silhouette and mediastinal contours with atherosclerotic plaque within the thoracic aorta. The lungs are hyperexpanded with flattening of the bilateral hemidiaphragms. Evaluation of the retrosternal clear space is obscured secondary to overlying soft tissues. Unchanged chronic partially loculated trace/small left-sided effusion with associated left mid and lower lung heterogeneous opacities. Unchanged granuloma within the with left lower lung with associated partially calcified left hilar lymph nodes. No new focal airspace opacities. No pneumothorax. No definite evidence of edema.  Unchanged bones.  IMPRESSION: 1.  No acute cardiopulmonary disease. 2. Persistent findings lung hyperexpansion, the sequela of prior granulomatous disease and chronic small/trace left-sided pleural effusion.   Electronically Signed   By: Simonne Come M.D.   On: 01/03/2014 14:53       Assessment & Plan:   Problem List Items Addressed This Visit   Chronic fatigue syndrome - Primary     Significant and chronic "sick" symptoms, overlap with anxiety and cognitive impairment (spouse and pt), likely contributing to chronic "unwell" symptoms  Review and reassurance provided to patient and spouse today Will refer to rheumatology at [facility] at family request (per newspaper article on CFS) Continue same sertraline and scheduled BZ S/p counseling with gutterman 02/2013 x 3 OV, to follow up prn     Relevant Orders      Ambulatory referral to Rheumatology   Chronic nausea     This has been a chronic recurrent problem for her Reviewed prior CT and imaging - also recent labs Exam benign - symptomatic care as needed     Relevant Orders      Ambulatory referral to Rheumatology   Facial twitching     eval and tx ongoing by neuro - but pt ? benefit of botox injections q29mo -  hosp for "severe" spasm L face 10/2013 - no CVA or TIA Advised follow up for repeat injection as needed (neuro feels psychogenic) Continue baclofen as ongoing - increased dose 10/24/13    Relevant Orders      Ambulatory referral to Rheumatology   Pleuritic chest pain     Recent eval with my partner for same CXR negative 01/03/14 recommended scheduled Tyenol    Relevant Orders      Ambulatory referral to Rheumatology

## 2014-01-07 NOTE — Patient Instructions (Addendum)
It was good to see you today.  We have reviewed your prior records including labs and tests today  Medications reviewed and updated - no changes  we'll make referral to rheumatology at [facility] as requested. Our office will contact you regarding appointment(s) once made.  Fatigue Fatigue is a feeling of tiredness, lack of energy, lack of motivation, or feeling tired all the time. Having enough rest, good nutrition, and reducing stress will normally reduce fatigue. Consult your caregiver if it persists. The nature of your fatigue will help your caregiver to find out its cause. The treatment is based on the cause.  CAUSES  There are many causes for fatigue. Most of the time, fatigue can be traced to one or more of your habits or routines. Most causes fit into one or more of three general areas. They are: Lifestyle problems  Sleep disturbances.  Overwork.  Physical exertion.  Unhealthy habits.  Poor eating habits or eating disorders.  Alcohol and/or drug use .  Lack of proper nutrition (malnutrition). Psychological problems  Stress and/or anxiety problems.  Depression.  Grief.  Boredom. Medical Problems or Conditions  Anemia.  Pregnancy.  Thyroid gland problems.  Recovery from major surgery.  Continuous pain.  Emphysema or asthma that is not well controlled  Allergic conditions.  Diabetes.  Infections (such as mononucleosis).  Obesity.  Sleep disorders, such as sleep apnea.  Heart failure or other heart-related problems.  Cancer.  Kidney disease.  Liver disease.  Effects of certain medicines such as antihistamines, cough and cold remedies, prescription pain medicines, heart and blood pressure medicines, drugs used for treatment of cancer, and some antidepressants. SYMPTOMS  The symptoms of fatigue include:   Lack of energy.  Lack of drive (motivation).  Drowsiness.  Feeling of indifference to the surroundings. DIAGNOSIS  The  details of how you feel help guide your caregiver in finding out what is causing the fatigue. You will be asked about your present and past health condition. It is important to review all medicines that you take, including prescription and non-prescription items. A thorough exam will be done. You will be questioned about your feelings, habits, and normal lifestyle. Your caregiver may suggest blood tests, urine tests, or other tests to look for common medical causes of fatigue.  TREATMENT  Fatigue is treated by correcting the underlying cause. For example, if you have continuous pain or depression, treating these causes will improve how you feel. Similarly, adjusting the dose of certain medicines will help in reducing fatigue.  HOME CARE INSTRUCTIONS   Try to get the required amount of good sleep every night.  Eat a healthy and nutritious diet, and drink enough water throughout the day.  Practice ways of relaxing (including yoga or meditation).  Exercise regularly.  Make plans to change situations that cause stress. Act on those plans so that stresses decrease over time. Keep your work and personal routine reasonable.  Avoid street drugs and minimize use of alcohol.  Start taking a daily multivitamin after consulting your caregiver. SEEK MEDICAL CARE IF:   You have persistent tiredness, which cannot be accounted for.  You have fever.  You have unintentional weight loss.  You have headaches.  You have disturbed sleep throughout the night.  You are feeling sad.  You have constipation.  You have dry skin.  You have gained weight.  You are taking any new or different medicines that you suspect are causing fatigue.  You are unable to sleep at night.  You develop any unusual swelling of your legs or other parts of your body. SEEK IMMEDIATE MEDICAL CARE IF:   You are feeling confused.  Your vision is blurred.  You feel faint or pass out.  You develop severe headache.  You  develop severe abdominal, pelvic, or back pain.  You develop chest pain, shortness of breath, or an irregular or fast heartbeat.  You are unable to pass a normal amount of urine.  You develop abnormal bleeding such as bleeding from the rectum or you vomit blood.  You have thoughts about harming yourself or committing suicide.  You are worried that you might harm someone else. MAKE SURE YOU:   Understand these instructions.  Will watch your condition.  Will get help right away if you are not doing well or get worse. Document Released: 08/25/2007 Document Revised: 01/20/2012 Document Reviewed: 08/25/2007 [facility] Patient Information 2014 [place], [place].

## 2014-01-10 ENCOUNTER — Other Ambulatory Visit: Payer: Self-pay | Admitting: Internal Medicine

## 2014-01-10 ENCOUNTER — Telehealth: Payer: Self-pay | Admitting: Neurology

## 2014-01-10 NOTE — Telephone Encounter (Signed)
Pt's husband called.  Mrs. Berns had appointment scheduled for 3-5 but canceled it because she was feeling better.  Since canceling appointment her speech became slurry and her left side of face is drooped.  The husband thought she was having a stroke and called 911, went to [facility] and then transferred to [facility] and found that she didn't have a stroke.  He would like to know if it would be possible to get an appointment anytime this week?  Please call to discuss.  Thank you.  You can contact him on the home # [phone] or cell # [phone].

## 2014-01-10 NOTE — Telephone Encounter (Signed)
Spoke to pts husband.  New consult, existing pt for TIA / 10-2013.  Confirmed appt.

## 2014-01-11 ENCOUNTER — Encounter: Payer: Self-pay | Admitting: Internal Medicine

## 2014-01-11 ENCOUNTER — Ambulatory Visit (INDEPENDENT_AMBULATORY_CARE_PROVIDER_SITE_OTHER): Payer: Medicare Other | Admitting: Internal Medicine

## 2014-01-11 VITALS — BP 106/60 | HR 71 | Temp 99.0°F | Wt 177.1 lb

## 2014-01-11 DIAGNOSIS — R110 Nausea: Secondary | ICD-10-CM

## 2014-01-11 DIAGNOSIS — E876 Hypokalemia: Secondary | ICD-10-CM

## 2014-01-11 NOTE — Patient Instructions (Signed)
Please take the probiotic , Florastor OR Align, every day for the nausea. This will replace the normal bacteria which  are necessary for formation of normal stool and processing of food.

## 2014-01-11 NOTE — Progress Notes (Signed)
   Subjective:    Patient ID: Paula [name], female    DOB: 1927-01-10, 80 y.o.   MRN: [id number]  HPI .  She describes chronic intermittent nausea since she had her abdominal aneurysm surgery 2 years ago.   She & her husband question chronic fatigue variant.  Her recent labs were reviewed. Her last liver function test were done December; they were normal. K+ was 3.2 in January; no F/U since  She has seen Dr Rhea BeltonPyrtle, GI      Review of Systems  She denies significant  dyspepsia, dysphagia,or unexplained weight loss.  Because of macular degeneration she is unsure whether she has been having melena, rectal bleeding, or small caliber stools.  Change in stools such as diarrhea or constipation denied  Dysuria, pyuria, hematuria or flank pain are absent  No associated back pain with radiation anteriorly.   No associated rash or skin lesions present  Abnormal bruising or bleeding not present     Objective:   Physical Exam General appearance is one of good health and nourishment w/o distress.  Eyes: No conjunctival inflammation or scleral icterus is present.  Oral exam: Dental hygiene is good; lips and gums are healthy appearing.There is no oropharyngeal erythema or exudate noted. Tongue dry  Heart:  Normal rate and regular rhythm. S1 and S2 normal without gallop, murmur, click, rub or other extra sounds     Lungs:faint rales @ bases. No rubs present.No increased work of breathing.   Abdomen: bowel sounds normal, soft and non-tender without masses, organomegaly or hernias noted.  No guarding or rebound . No tenderness over the flanks to percussion   Skin:Warm & dry.  Intact without suspicious lesions or rashes ; no jaundice or tenting  Lymphatic: No lymphadenopathy is noted about the head, neck, axilla  Frustrated with veiled anger about chronic condition. Complaining of prior physicians             Assessment & Plan:  #1 chronic nausea #2 low K+ See orders

## 2014-01-11 NOTE — Progress Notes (Signed)
Pre visit review using our clinic review tool, if applicable. No additional management support is needed unless otherwise documented below in the visit note. 

## 2014-01-12 ENCOUNTER — Telehealth: Payer: Self-pay | Admitting: *Deleted

## 2014-01-12 ENCOUNTER — Other Ambulatory Visit (INDEPENDENT_AMBULATORY_CARE_PROVIDER_SITE_OTHER): Payer: Medicare Other

## 2014-01-12 DIAGNOSIS — R110 Nausea: Secondary | ICD-10-CM

## 2014-01-12 LAB — BASIC METABOLIC PANEL
BUN: 19 mg/dL (ref 6–23)
CALCIUM: 9.6 mg/dL (ref 8.4–10.5)
CHLORIDE: 105 meq/L (ref 96–112)
CO2: 30 meq/L (ref 19–32)
Creatinine, Ser: 1 mg/dL (ref 0.4–1.2)
GFR: 54.14 mL/min — ABNORMAL LOW (ref >60.00)
GLUCOSE: 126 mg/dL — AB (ref 70–99)
Potassium: 3.6 mEq/L (ref 3.5–5.1)
Sodium: 141 mEq/L (ref 135–145)

## 2014-01-12 LAB — HEPATIC FUNCTION PANEL
ALBUMIN: 3.9 g/dL (ref 3.5–5.2)
ALT: 17 U/L (ref 0–35)
AST: 14 U/L (ref 0–37)
Alkaline Phosphatase: 49 U/L (ref 39–117)
BILIRUBIN DIRECT: 0.1 mg/dL (ref 0.0–0.3)
Total Bilirubin: 0.9 mg/dL (ref 0.3–1.2)
Total Protein: 6.4 g/dL (ref 6.0–8.3)

## 2014-01-12 LAB — LIPASE: LIPASE: 38 U/L (ref 11.0–59.0)

## 2014-01-12 LAB — AMYLASE: Amylase: 55 U/L (ref 27–131)

## 2014-01-12 NOTE — Telephone Encounter (Signed)
Inform pt pt with md recommendations. See lab results...Raechel Chute/lmb

## 2014-01-12 NOTE — Telephone Encounter (Signed)
Message copied by Deatra JamesBRAND, LUCY M on Wed Jan 12, 2014  4:42 PM ------      Message from: Pecola LawlessHOPPER, WILLIAM F      Created: Wed Jan 12, 2014  4:28 PM       Metformin can cause gastrointestinal symptoms; it should be taken after the meal is completed ------

## 2014-01-13 ENCOUNTER — Ambulatory Visit: Payer: Medicare Other | Admitting: Neurology

## 2014-01-17 ENCOUNTER — Encounter (INDEPENDENT_AMBULATORY_CARE_PROVIDER_SITE_OTHER): Payer: Self-pay

## 2014-01-17 ENCOUNTER — Ambulatory Visit (INDEPENDENT_AMBULATORY_CARE_PROVIDER_SITE_OTHER): Payer: Medicare Other | Admitting: Neurology

## 2014-01-17 ENCOUNTER — Encounter: Payer: Self-pay | Admitting: Neurology

## 2014-01-17 VITALS — BP 156/80 | HR 73 | Temp 98.5°F | Ht 65.5 in | Wt 179.0 lb

## 2014-01-17 DIAGNOSIS — G459 Transient cerebral ischemic attack, unspecified: Secondary | ICD-10-CM

## 2014-01-17 NOTE — Progress Notes (Signed)
[facility] [address] [place]. [place] [address] [phone]       OFFICE CONSULT NOTE  [name] Date of Birth:  Sep 30, 1927 Medical Record Number:  [id number]   Referring MD:  Paula Bilis, MD  Reason for Referral:  TIA  HPI: 64 year Caucasian lady refrred for TIA in December 2014. History id provided by patient and husband as well as review of hospital admission records. She developed sudden onset left facial droop and difficulty speaking on 11/06/13 and was admitted for 2 days and underwent CT head and brain MRI which I personally reviewed showed no acute abnormality and only changes of chronic small vessel disease.Transthoraxic echo showed normal ejection fraction.carotid dopplers showed no significant stenosis.Lipid profile was normal.She was started on aspirin .She has no prior h/o stroke or TIA. She has h/o hemifacial spasms since 2011  for which she gets xeomin injections by Dr Paula [name] with good benefit and also takes baclofen prn. She feels she is on too many medicines and plans to discuss this with Dr Paula [name]. She takes xaanx and zoloft. She has chronic fatigue and plans to see a specialist at [facility] soon for this.  ROS:   14 system review of systems is positive for face twitchings,tightness, speech difficulty,gait difficulty and all other systems negative  PMH:  Past Medical History  Diagnosis Date  . Depression   . Ejection fraction     EF 40%, echo, November, 2012  / in improved, EF 60%, echo, December, 2012  . Contrast media allergy     Patient feels poorly with contrast  . Status post AAA (abdominal aortic aneurysm) repair     Surgical repair, Dr. Hart Rochester, December 27, 2011  . Pre-syncope     vasovagal w/ heart block  . Parotid mass     has refused further eval 10/2011  . GERD (gastroesophageal reflux disease)   . Anxiety   . Facial tic     L sided spasms - Botox trial summer 2013, ?effective  . Macular degeneration   . AAA  (abdominal aortic aneurysm)     s/p repair 12/2011  . Congestive heart failure     NL EF 10/2011 echo   . Diabetes mellitus type II, controlled   . Hypothyroidism   . Sinus bradycardia 09/19/2011    Occurring simultaneously with complete heart block   . Hypertension   . COPD (chronic obstructive pulmonary disease)   . Facial spasm     L hemifacial-treated w/ botox xeomin -Dr Paula [name]    Social History:  History   Social History  . Marital Status: Married    Spouse Name: Paula [name]    Number of Children: 4  . Years of Education: 9th   Occupational History  . retired Other    worked for 25 years, hairdresser   Social History Main Topics  . Smoking status: Former Smoker -- 1.00 packs/day for 30 years    Types: Cigarettes    Quit date: 08/31/2011  . Smokeless tobacco: Never Used  . Alcohol Use: No  . Drug Use: No  . Sexual Activity: Not on file   Other Topics Concern  . Not on file   Social History Narrative   Patient is married Paula [name]) and lives at home with her husband.   Patient worked as a Interior and spatial designer for 25 years.   Patient drinks a caffeine beverage daily.   Patient is right-handed.    Medications:   Current Outpatient Prescriptions on File Prior  to Visit  Medication Sig Dispense Refill  . acetaminophen (TYLENOL) 500 MG tablet Take 500 mg by mouth every 6 (six) hours as needed (pain).      Marland Kitchen albuterol (PROVENTIL HFA;VENTOLIN HFA) 108 (90 BASE) MCG/ACT inhaler Inhale 2 puffs into the lungs every 6 (six) hours as needed for wheezing or shortness of breath.      Marland Kitchen albuterol-ipratropium (COMBIVENT) 18-103 MCG/ACT inhaler Inhale 2 puffs into the lungs every 6 (six) hours as needed for wheezing or shortness of breath.      . ALPRAZolam (XANAX) 0.5 MG tablet Take 1 tablet (0.5 mg total) by mouth 3 (three) times daily.  90 tablet  5  . aspirin 325 MG EC tablet Take 325 mg by mouth daily.      . baclofen (LIORESAL) 10 MG tablet Take 10 mg by mouth 3 (three) times daily.      .  calcium carbonate (OS-CAL) 600 MG TABS Take 600 mg by mouth every other day.       . carvedilol (COREG) 6.25 MG tablet Take 6.25 mg by mouth 2 (two) times daily with a meal.      . cetirizine (ZYRTEC) 10 MG tablet Take 10 mg by mouth daily as needed for allergies.      . Cholecalciferol (VITAMIN D3) 5000 UNITS TABS Take 5,000 Units by mouth every other day.       . furosemide (LASIX) 20 MG tablet Take 20 mg by mouth.      . gabapentin (NEURONTIN) 100 MG capsule Take 100 mg by mouth at bedtime.      Marland Kitchen guaiFENesin (MUCINEX) 600 MG 12 hr tablet Take 600 mg by mouth 2 (two) times daily.      Marland Kitchen levothyroxine (SYNTHROID, LEVOTHROID) 175 MCG tablet Take 175 mcg by mouth daily before breakfast.      . losartan-hydrochlorothiazide (HYZAAR) 50-12.5 MG per tablet Take 1 tablet by mouth daily.      . metFORMIN (GLUCOPHAGE-XR) 500 MG 24 hr tablet Take 0.5 tablets (250 mg total) by mouth daily with breakfast.      . mometasone (NASONEX) 50 MCG/ACT nasal spray Place 2 sprays into the nose daily as needed (allergies).      . ondansetron (ZOFRAN) 4 MG tablet Take 4 mg by mouth every 8 (eight) hours as needed for nausea or vomiting.      . pantoprazole (PROTONIX) 40 MG tablet Take 40 mg by mouth daily.      Bertram Gala Glycol-Propyl Glycol (SYSTANE) 0.4-0.3 % SOLN Apply 1 drop to eye 2 (two) times daily as needed (dry eyes).      . potassium chloride SA (K-DUR,KLOR-CON) 20 MEQ tablet Take 20 mEq by mouth daily.      . sertraline (ZOLOFT) 50 MG tablet Take 50 mg by mouth daily.       No current facility-administered medications on file prior to visit.    Allergies:   Allergies  Allergen Reactions  . Bactrim [Sulfamethoxazole-Tmp Ds]   . Contrast Media [Iodinated Diagnostic Agents] Anaphylaxis  . Gadolinium Derivatives   . Iohexol Anaphylaxis, Shortness Of Breath and Swelling     Desc: PT STATES SHE HAD A SEVERE REACTION TO IV CONRAST WITH THROAT SWELLING AND SOB. SHE WAS ADMITTED TO THE HOSPITAL. SHE HAS  NEVER HAD CONTRAST AGAIN.   Marland Kitchen Penicillins Rash  . Sulfa Antibiotics Anaphylaxis  . Avelox [Moxifloxacin Hcl In Nacl] Rash  . Ciprofloxacin Itching  . Delsym [Dextromethorphan] Other (See Comments)    Hallucination  .  Hydromet [Hydrocodone-Homatropine] Other (See Comments)    Pt states med make her hallucinate    Physical Exam General: well developed, well nourished, seated, in no evident distress Head: head normocephalic and atraumatic. Orohparynx benign Neck: supple with no carotid or supraclavicular bruits Cardiovascular: regular rate and rhythm, no murmurs Musculoskeletal: no deformity Skin:  no rash/petichiae Vascular:  Normal pulses all extremities  Neurologic Exam Mental Status: Awake and fully alert. Oriented to place and time. Recent and remote memory idiminished. Attention span, concentration and fund of knowledge diminished.. Mood and affect appropriate. MMSE 20/30.AFT 5. Clock Drawing 1/4.  Cranial Nerves: Fundoscopic exam not done. Pupils equal, briskly reactive to light. Extraocular movements full without nystagmus. Visual fields full to confrontation. Hearing intact. Facial sensation intact. Face, tongue, palate moves normally and symmetrically. No facial twitchings or hemifacial spasms noted. Motor: Normal bulk and tone. Normal strength in all tested extremity muscles. Sensory.: intact to touch and pinprick  Sensation but diminished vibratory sensation from ankle down...  Coordination: Rapid alternating movements normal in all extremities. Finger-to-nose and heel-to-shin performed accurately bilaterally. Gait and Station: Arises from chair with difficulty. Stance is broad basedl. Gait demonstrates mild imbalance . unable to heel, toe and tandem walk without difficulty.  Reflexes: 1+ and symmetric. Toes downgoing.       ASSESSMENT: [age] year Caucasian lady with transient left face weakness and dysarthria in December 2014 possibly TIA with vascular risk factors of  diabetes, Hypertension, CHF and PAD.H/O left hemifacial spasms treated with Xeomin injections by Dr Paula ArabiaYan. Mild cognitive impiarment versus mild dementia    PLAN: I had a long discussion with the patient and husband regarding her episode of transient left facial droop and slurred speech which was probably a TIA secondary to small vessel disease. I recommend she stay on aspirin 325 mg daily and maintain aggressive risk factor control with pressure goal below 130/90, diabetes with hemoglobin A1c goal below 6.5% and lipids with LDL cholesterol goal below 70 mg percent. She was advised to keep her upcoming followup visit with Dr. Terrace ArabiaYan. No further follow up with me is necessary.  Note: This document was prepared with digital dictation and possible smart phrase technology. Any transcriptional errors that result from this process are unintentional.

## 2014-01-17 NOTE — Patient Instructions (Addendum)
I had a long discussion with the patient and husband regarding her episode of transient left facial droop and slurred speech which was probably a TIA secondary to small vessel disease. I recommend she stay on aspirin 325 mg daily and maintain aggressive risk factor control with pressure goal below 130/90, diabetes with hemoglobin A1c goal below 6.5% and lipids with LDL cholesterol goal below 70 mg percent. She was advised to keep her upcoming followup visit with Dr. Terrace ArabiaYan. No further follow up with me is necessary. Transient Ischemic Attack A transient ischemic attack (TIA) is a "warning stroke" that causes stroke-like symptoms. Unlike a stroke, a TIA does not cause permanent damage to the brain. The symptoms of a TIA can happen very fast and do not last long. It is important to know the symptoms of a TIA and what to do. This can help prevent a major stroke or death. CAUSES   A TIA is caused by a temporary blockage in an artery in the brain or neck (carotid artery). The blockage does not allow the brain to get the blood supply it needs and can cause different symptoms. The blockage can be caused by either:  A blood clot.  Fatty buildup (plaque) in a neck or brain artery. RISK FACTORS  High blood pressure (hypertension).  High cholesterol.  Diabetes mellitus.  Heart disease.  The build up of plaque in the blood vessels (peripheral artery disease or atherosclerosis).  The build up of plaque in the blood vessels providing blood and oxygen to the brain (carotid artery stenosis).  An abnormal heart rhythm (atrial fibrillation).  Obesity.  Smoking.  Taking oral contraceptives (especially in combination with smoking).  Physical inactivity.  A diet high in fats, salt (sodium), and calories.  Alcohol use.  Use of illegal drugs (especially cocaine and methamphetamine).  Being female.  Being African American.  Being over the age of 78.  Family history of stroke.  Previous history of  blood clots, stroke, TIA, or heart attack.  Sickle cell disease. SYMPTOMS  TIA symptoms are the same as a stroke but are temporary. These symptoms usually develop suddenly, or may be newly present upon awakening from sleep:  Sudden weakness or numbness of the face, arm, or leg, especially on one side of the body.  Sudden trouble walking or difficulty moving arms or legs.  Sudden confusion.  Sudden personality changes.  Trouble speaking (aphasia) or understanding.  Difficulty swallowing.  Sudden trouble seeing in one or both eyes.  Double vision.  Dizziness.  Loss of balance or coordination.  Sudden severe headache with no known cause.  Trouble reading or writing.  Loss of bowel or bladder control.  Loss of consciousness. DIAGNOSIS  Your caregiver may be able to determine the presence or absence of a TIA based on your symptoms, history, and physical exam. Computed tomography (CT scan) of the brain is usually performed to help identify a TIA. Other tests may be done to diagnose a TIA. These tests may include:  Electrocardiography.  Continuous heart monitoring.  Echocardiography.  Carotid ultrasonography.  Magnetic resonance imaging (MRI).  A scan of the brain circulation.  Blood tests. PREVENTION  The risk of a TIA can be decreased by appropriately treating high blood pressure, high cholesterol, diabetes, heart disease, and obesity and by quitting smoking, limiting alcohol, and staying physically active. TREATMENT  Time is of the essence. Since the symptoms of TIA are the same as a stroke, it is important to seek treatment within 3 4 hours  of the start of symptoms because you may receive a medicine to dissolve the clot (thrombolytic) that cannot be given after that time. Treatment options vary. Treatment options may include rest, oxygen, intravenous (IV) fluids, and medicines to thin the blood (anticoagulants). Medicines and diet may be used to address diabetes,  high blood pressure, and other risk factors. Measures will be taken to prevent short-term and long-term complications, including infection from breathing foreign material into the lungs (aspiration pneumonia), blood clots in the legs, and falls. Treatment options include procedures to either remove plaque in the carotid arteries or dilate carotid arteries that have narrowed due to plaque. Those procedures are:  Carotid endarterectomy.  Carotid angioplasty and stenting. HOME CARE INSTRUCTIONS   Take all medicines prescribed by your caregiver. Follow the directions carefully. Medicines may be used to control risk factors for a stroke. Be sure you understand all your medicine instructions.  You may be told to take aspirin or the anticoagulant warfarin. Warfarin needs to be taken exactly as instructed.  Taking too much or too little warfarin is dangerous. Too much warfarin increases the risk of bleeding. Too little warfarin continues to allow the risk for blood clots. While taking warfarin, you will need to have regular blood tests to measure your blood clotting time. A PT blood test measures how long it takes for blood to clot. Your PT is used to calculate another value called an INR. Your PT and INR help your caregiver to adjust your dose of warfarin. The dose can change for many reasons. It is critically important that you take warfarin exactly as prescribed.  Many foods, especially foods high in vitamin K can interfere with warfarin and affect the PT and INR. Foods high in vitamin K include spinach, kale, broccoli, cabbage, collard and turnip greens, brussels sprouts, peas, cauliflower, seaweed, and parsley as well as beef and pork liver, green tea, and soybean oil. You should eat a consistent amount of foods high in vitamin K. Avoid major changes in your diet, or notify your caregiver before changing your diet. Arrange a visit with a dietitian to answer your questions.  Many medicines can interfere  with warfarin and affect the PT and INR. You must tell your caregiver about any and all medicines you take, this includes all vitamins and supplements. Be especially cautious with aspirin and anti-inflammatory medicines. Do not take or discontinue any prescribed or over-the-counter medicine except on the advice of your caregiver or pharmacist.  Warfarin can have side effects, such as excessive bruising or bleeding. You will need to hold pressure over cuts for longer than usual. Your caregiver or pharmacist will discuss other potential side effects.  Avoid sports or activities that may cause injury or bleeding.  Be mindful when shaving, flossing your teeth, or handling sharp objects.  Alcohol can change the body's ability to handle warfarin. It is best to avoid alcoholic drinks or consume only very small amounts while taking warfarin. Notify your caregiver if you change your alcohol intake.  Notify your dentist or other caregivers before procedures.  Eat a diet that includes 5 or more servings of fruits and vegetables each day. This may reduce the risk of stroke. Certain diets may be prescribed to address high blood pressure, high cholesterol, diabetes, or obesity.  A low-sodium, low-saturated fat, low-trans fat, low-cholesterol diet is recommended to manage high blood pressure.  A low-saturated fat, low-trans fat, low-cholesterol, and high-fiber diet may control cholesterol levels.  A controlled-carbohydrate, controlled-sugar diet is recommended to  manage diabetes.  A reduced-calorie, low-sodium, low-saturated fat, low-trans fat, low-cholesterol diet is recommended to manage obesity.  Maintain a healthy weight.  Stay physically active. It is recommended that you get at least 30 minutes of activity on most or all days.  Do not smoke.  Limit alcohol use even if you are not taking warfarin. Moderate alcohol use is considered to be:  No more than 2 drinks each day for men.  No more than 1  drink each day for nonpregnant women.  Stop drug abuse.  Home safety. A safe home environment is important to reduce the risk of falls. Your caregiver may arrange for specialists to evaluate your home. Having grab bars in the bedroom and bathroom is often important. Your caregiver may arrange for equipment to be used at home, such as raised toilets and a seat for the shower.  Follow all instructions for follow-up with your caregiver. This is very important. This includes any referrals and lab tests. Proper follow up can prevent a stroke or another TIA from occurring. SEEK MEDICAL CARE IF:  You have personality changes.  You have difficulty swallowing.  You are seeing double.  You have dizziness.  You have a fever.  You have skin breakdown. SEEK IMMEDIATE MEDICAL CARE IF:  Any of these symptoms may represent a serious problem that is an emergency. Do not wait to see if the symptoms will go away. Get medical help right away. Call your local emergency services (911 in [place] Do not drive yourself to the hospital.  You have sudden weakness or numbness of the face, arm, or leg, especially on one side of the body.  You have sudden trouble walking or difficulty moving arms or legs.  You have sudden confusion.  You have trouble speaking (aphasia) or understanding.  You have sudden trouble seeing in one or both eyes.  You have a loss of balance or coordination.  You have a sudden, severe headache with no known cause.  You have new chest pain or an irregular heartbeat.  You have a partial or total loss of consciousness. MAKE SURE YOU:   Understand these instructions.  Will watch your condition.  Will get help right away if you are not doing well or get worse. Document Released: 08/07/2005 Document Revised: 10/14/2012 Document Reviewed: 12/21/2009 [facility] Patient Information 2014 [place], [place].

## 2014-01-19 ENCOUNTER — Other Ambulatory Visit: Payer: Self-pay | Admitting: Internal Medicine

## 2014-01-19 ENCOUNTER — Telehealth: Payer: Self-pay | Admitting: Internal Medicine

## 2014-01-19 NOTE — Telephone Encounter (Signed)
Dr Felicity CoyerLeschber  A referral was placed  For pt to see rheumatology  For Dr Alvino Chapelellen Ezequiel Ganserwright [facility]ton [facility]   [facility] a statement   Rheumatology  Clinic [phone] Fax [phone] "Bases on the providers review of records they do not recommend scheduling the patient  For another appointment in Rheumatology at this time but feel  That the patient would likely benefit more by being  Referred to an infectious disease specialist at this time .Provider also noted this referral to an infectious disease specialist per article patient referenced on chronic fatigue syndrome in a newspaper article she read "

## 2014-01-19 NOTE — Telephone Encounter (Signed)
Let pt/family know [facility] has declined referral - Would they like to go to [facility]? No other suggestions from me - thanks

## 2014-01-28 NOTE — Telephone Encounter (Signed)
Patient's husband called this afternoon requesting a referral to infectious disease.

## 2014-01-28 NOTE — Telephone Encounter (Signed)
Patient is calling to request a referral to infectious disease. Please advise.

## 2014-01-31 ENCOUNTER — Telehealth: Payer: Self-pay | Admitting: *Deleted

## 2014-01-31 ENCOUNTER — Ambulatory Visit (INDEPENDENT_AMBULATORY_CARE_PROVIDER_SITE_OTHER): Payer: Medicare Other | Admitting: Internal Medicine

## 2014-01-31 ENCOUNTER — Ambulatory Visit: Payer: Medicare Other | Admitting: Podiatry

## 2014-01-31 ENCOUNTER — Other Ambulatory Visit (INDEPENDENT_AMBULATORY_CARE_PROVIDER_SITE_OTHER): Payer: Medicare Other

## 2014-01-31 ENCOUNTER — Encounter: Payer: Self-pay | Admitting: Internal Medicine

## 2014-01-31 VITALS — BP 120/70 | HR 80 | Temp 98.2°F | Resp 15 | Wt 178.0 lb

## 2014-01-31 DIAGNOSIS — D649 Anemia, unspecified: Secondary | ICD-10-CM

## 2014-01-31 DIAGNOSIS — J189 Pneumonia, unspecified organism: Secondary | ICD-10-CM

## 2014-01-31 DIAGNOSIS — R0902 Hypoxemia: Secondary | ICD-10-CM

## 2014-01-31 LAB — IBC PANEL
Iron: 73 ug/dL (ref 42–145)
SATURATION RATIOS: 22.3 % (ref 20.0–50.0)
TRANSFERRIN: 233.9 mg/dL (ref 212.0–360.0)

## 2014-01-31 LAB — CBC WITH DIFFERENTIAL/PLATELET
BASOS ABS: 0.1 10*3/uL (ref 0.0–0.1)
Basophils Relative: 0.4 % (ref 0.0–3.0)
Eosinophils Absolute: 0.5 10*3/uL (ref 0.0–0.7)
Eosinophils Relative: 3.8 % (ref 0.0–5.0)
HCT: 34.1 % — ABNORMAL LOW (ref 36.0–46.0)
HEMOGLOBIN: 11.4 g/dL — AB (ref 12.0–15.0)
LYMPHS ABS: 3.2 10*3/uL (ref 0.7–4.0)
Lymphocytes Relative: 23 % (ref 12.0–46.0)
MCHC: 33.4 g/dL (ref 30.0–36.0)
MCV: 101.2 fl — AB (ref 78.0–100.0)
MONO ABS: 0.9 10*3/uL (ref 0.1–1.0)
MONOS PCT: 6.4 % (ref 3.0–12.0)
NEUTROS ABS: 9.3 10*3/uL — AB (ref 1.4–7.7)
Neutrophils Relative %: 66.4 % (ref 43.0–77.0)
PLATELETS: 267 10*3/uL (ref 150.0–400.0)
RBC: 3.37 Mil/uL — ABNORMAL LOW (ref 3.87–5.11)
RDW: 14.2 % (ref 11.5–14.6)
WBC: 14.1 10*3/uL — ABNORMAL HIGH (ref 4.5–10.5)

## 2014-01-31 NOTE — Telephone Encounter (Signed)
Patient's husband aware to discuss on return visit

## 2014-01-31 NOTE — Patient Instructions (Addendum)
Please sign a release of records to  [facility] for records related to 01/30/14 visit

## 2014-01-31 NOTE — Telephone Encounter (Signed)
Please let spouse/pt know we can discuss further at out next ROV - no referral done at this time

## 2014-01-31 NOTE — Telephone Encounter (Signed)
[facility] Triage Call Report Triage Record Num: [id number] Operator: Remonia RichterMichele Felton Patient Name: Paula [name] Scatena Call Date & Time: 01/30/2014 9:08:27PM Patient Phone: [phone] PCP: Rene PaciValerie Leschber Patient Gender: Female PCP Fax : [phone] Patient DOB: 19-Apr-1927 Practice Name: [facility] Reason for Call: Caller: [name]; PCP: Rene PaciLeschber, Valerie (Adults only); CB#: [phone]; Call regarding Medication Issue - not feeling right after starting Clarithromycin at 15:00 this PM 01/30/14, seen in Urgent Care and diagnosed with Pneumonia, now she has pain in back and under both arms, there and worse with cough and movement but also when still, 911 disposition obtained per Chest Pain Guideline due to chest pain with call, husband given advice and will discuss with her after call as she is reluctant. Protocol(s) Used: Chest Pain Recommended Outcome per Protocol: Activate EMS 911 Reason for Outcome: Pressure, fullness, squeezing sensation or pain anywhere in the chest lasting 5 or more minutes now or within the last hour. Pain is NOT associated with taking a deep breath or a productive cough, movement, or touch to a localized area on the chest. Care Advice: ~ IMMEDIATE ACTION After calling EMS 911, have the person chew one aspirin tablet (325 mg), or 4 baby aspirin (81mg ) with a small amount of water now if conscious, not allergic to aspirin, or if has not been told to avoid taking aspirin by their provider. It is important to use aspirin, not acetaminophen. ~ ~ Tell providers if you are taking an erectile dysfunction drug such as Viagra(R), Cialis(R), Levitra(R).

## 2014-01-31 NOTE — Progress Notes (Signed)
   Subjective:    Patient ID: Paula LarsenSusan R Kant, female    DOB: November 10, 1927, 80 y.o.   MRN: [id number]  HPI  She was seen at [facility] yesterday and prescribed clarithromycin. She had gone there because she "felt sick" mainly manifested as nausea.  She has used a dual agent inhaler for wheezing noted by her husband.  She was not having specific chest symptoms such as purulent sputum, fever, chills, or sweats.  Unfortunately we do not have copies of the x-ray or labs as that urgent care is not affiliated with [facility].  Last CXray here in February revealed hyperexpansion, remote granulomatous disease & small L effusion     Review of Systems  She was found to be anemic and placed on iron. Her most recent hemoglobin /hematocrit here were 01/03/14. Hemoglobin was 11.6 and hematocrit 35.8. White blood count was elevated at that time  Her husband states that she had pneumonia on 4 occasions in the last 24+ months.      Objective:   Physical Exam General appearance: chronically ill Eyes: No conjunctival inflammation or scleral icterus is present.  Oral exam: Dental hygiene is good; lips and gums are healthy appearing.There is no oropharyngeal erythema or exudate noted.   Heart:  Normal rate and regular rhythm. S1 and S2 normal without gallop, murmur, click, rub or other extra sounds     Lungs:No wheezes.Mild bibasilar rhonchi & rales  present.No increased work of breathing.   Abdomen: bowel sounds decreased,  non-tender without masses, organomegaly or hernias noted.  No guarding or rebound .   Musculoskeletal: Clubbing w/o cyanosis. No edema  Skin:Warm & dry.  Intact without suspicious lesions or rashes ; no jaundice or tenting  Lymphatic: No lymphadenopathy is noted about the head, neck, axilla areas.                Assessment & Plan:  #1 history of PNA; unfortunately we do not have access to those x-rays. Those reports will be requested  #2 anemia; need to  verify whether this is progressive  #3 hypoxemia.  Until she is stable home oxygen would be recommended.

## 2014-02-01 ENCOUNTER — Other Ambulatory Visit: Payer: Self-pay | Admitting: Internal Medicine

## 2014-02-02 ENCOUNTER — Encounter: Payer: Self-pay | Admitting: Internal Medicine

## 2014-02-02 ENCOUNTER — Other Ambulatory Visit (INDEPENDENT_AMBULATORY_CARE_PROVIDER_SITE_OTHER): Payer: Medicare Other

## 2014-02-02 ENCOUNTER — Ambulatory Visit (INDEPENDENT_AMBULATORY_CARE_PROVIDER_SITE_OTHER): Payer: Medicare Other | Admitting: Internal Medicine

## 2014-02-02 VITALS — BP 130/70 | HR 85 | Temp 98.4°F | Resp 16 | Wt 179.0 lb

## 2014-02-02 DIAGNOSIS — E039 Hypothyroidism, unspecified: Secondary | ICD-10-CM

## 2014-02-02 DIAGNOSIS — M25511 Pain in right shoulder: Secondary | ICD-10-CM

## 2014-02-02 DIAGNOSIS — M25512 Pain in left shoulder: Principal | ICD-10-CM

## 2014-02-02 DIAGNOSIS — M25551 Pain in right hip: Secondary | ICD-10-CM

## 2014-02-02 DIAGNOSIS — M25559 Pain in unspecified hip: Secondary | ICD-10-CM

## 2014-02-02 DIAGNOSIS — E1159 Type 2 diabetes mellitus with other circulatory complications: Secondary | ICD-10-CM

## 2014-02-02 DIAGNOSIS — M25519 Pain in unspecified shoulder: Secondary | ICD-10-CM

## 2014-02-02 DIAGNOSIS — M25552 Pain in left hip: Secondary | ICD-10-CM

## 2014-02-02 LAB — TSH: TSH: 0.25 u[IU]/mL — AB (ref 0.35–5.50)

## 2014-02-02 LAB — HEMOGLOBIN A1C: Hgb A1c MFr Bld: 7.4 % — ABNORMAL HIGH (ref 4.6–6.5)

## 2014-02-02 LAB — SEDIMENTATION RATE: Sed Rate: 19 mm/hr (ref 0–22)

## 2014-02-02 MED ORDER — SERTRALINE HCL 50 MG PO TABS: 50.0000 mg | ORAL_TABLET | Freq: Every day | ORAL | Status: DC

## 2014-02-02 MED ORDER — ASPIRIN 81 MG PO TBEC: 81.0000 mg | DELAYED_RELEASE_TABLET | Freq: Every day | ORAL | Status: DC

## 2014-02-02 NOTE — Patient Instructions (Signed)
Your next office appointment will be determined based upon review of your pending labs. Those instructions will be transmitted to you   by mail. Reflux of gastric acid may be asymptomatic as this may occur mainly during sleep.The triggers for reflux  include stress; the "aspirin family" ; alcohol; peppermint; and caffeine (coffee, tea, cola, and chocolate). The aspirin family would include aspirin and the nonsteroidal agents such as ibuprofen &  Naproxen. Tylenol would not cause reflux. If having symptoms ; food & drink should be avoided for @ least 2 hours before going to bed.  Metformin if taken without food in the stomach may cause nausea. The high dose aspirin may also contribute to nausea.

## 2014-02-02 NOTE — Progress Notes (Signed)
   Subjective:    Patient ID: Paula [name], female    DOB: [birth year], 80 y.o.   MRN: [id number]  HPI She presents with complaints of diffuse pain from the top of her head to her feet, especially bilateral shoulder pain, radiating to arms and hip girdle pain, radiating into legs.  She noticed the pain this morning when she awakened; the pain is about the same at this time. She reports that she feels worse now than when seen earlier in the week.  She is also nauseous; of note, she is taking clarithromycin for pneumonia since Monday 01/31/14. When seen at that time she did exhibit hypoxemia. Oxygen saturations are dramatically better (see vital signs)    Review of Systems  She has not had swelling or redness of the joints. Denies fever, chills, sweats. Denies palpitations, chest pain, dyspnea, paroxysmal nocturnal dyspnea, abdominal pain.     Objective:   Physical Exam General appearance is one of good nourishment w/o distress. However noted earlier in the week has resolved. She is well-dressed and hair groomed. Eyes: No conjunctival inflammation or scleral icterus is present.  Oral exam: Dental hygiene is good; lips and gums are healthy appearing.There is no oropharyngeal erythema or exudate noted.  Heart: Normal rate and regular rhythm. S1 and S2 normal without gallop, murmur, click, rub or other extra sounds  Lungs:Chest clear to auscultation; no wheezes, rhonchi,rales ,or rubs present.No increased work of breathing.  Abdomen: bowel sounds normal, soft and non-tender without masses, organomegaly or hernias noted. No guarding or rebound . No tenderness over the flanks to percussion  Musculoskeletal:  No pain with rotation of the shoulders or hips.  Skin:Warm & dry. Intact without suspicious lesions or rashes ; no jaundice or tenting . No increased temperature or erythema over the areas of pain. Lymphatic: No lymphadenopathy is noted about the head, neck, axilla      Assessment & Plan:    #1 Diffuse myalgias/arthralgias; R/O Polymyalgia rheumatica  #2 polypharmacy. I discussed the increased risk related to such a large number of medications in view of her age and comorbidities. My concern was that some of her symptoms are related to possible toxic or adverse drug reaction due to drug: Drug interactions. As possible examples I mentioned the large dose aspirin and metformin taken without food intake as possible causes of her chronic nausea.  #3 I recommended updating the status of her thyroid function and diabetes as labs are not current See orders  & AVS

## 2014-02-02 NOTE — Progress Notes (Signed)
Pre visit review using our clinic review tool, if applicable. No additional management support is needed unless otherwise documented below in the visit note. 

## 2014-02-02 NOTE — Progress Notes (Signed)
   Subjective:    Patient ID: Paula [name], female    DOB: 02-02-1927, 80 y.o.   MRN: [id number]  HPI She presents with complaints of bilateral shoulder pain, radiating to arms and hip girdle pain, radiating into legs.  She noticed the pain this morning when she awakened; the pain is about the same at this time. She reports that she feels worse now than when seen earlier in the week.  She is also nauseous; of note, she is taking clarithromycin for pneumonia since Monday 01/31/14.  Denies fever, chills, sweats.   Review of Systems Denies palpitations, chest pain, dyspnea, paroxysmal nocturnal dyspnea, abdominal pain.      Objective:   Physical Exam General appearance is one of good health and nourishment w/o distress.  Eyes: No conjunctival inflammation or scleral icterus is present.  Oral exam: Dental hygiene is good; lips and gums are healthy appearing.There is no oropharyngeal erythema or exudate noted.   Heart:  Normal rate and regular rhythm. S1 and S2 normal without gallop, murmur, click, rub or other extra sounds     Lungs:Chest clear to auscultation; no wheezes, rhonchi,rales ,or rubs present.No increased work of breathing.   Abdomen: bowel sounds normal, soft and non-tender without masses, organomegaly or hernias noted.  No guarding or rebound . No tenderness over the flanks to percussion  Musculoskeletal: Able to lie flat and sit up without help. Negative straight leg raising bilaterally. Gait normal  Skin:Warm & dry.  Intact without suspicious lesions or rashes ; no jaundice or tenting  Lymphatic: No lymphadenopathy is noted about the head, neck, axilla, or inguinal areas.       Assessment & Plan:  #1 Polymyalgia rheumatica ?

## 2014-02-03 ENCOUNTER — Other Ambulatory Visit: Payer: Self-pay | Admitting: Internal Medicine

## 2014-02-03 DIAGNOSIS — E039 Hypothyroidism, unspecified: Secondary | ICD-10-CM

## 2014-02-03 MED ORDER — LEVOTHYROXINE SODIUM 175 MCG PO TABS: 175.0000 ug | ORAL_TABLET | Freq: Every day | ORAL | Status: DC

## 2014-02-04 ENCOUNTER — Telehealth: Payer: Self-pay | Admitting: Neurology

## 2014-02-04 ENCOUNTER — Telehealth: Payer: Self-pay

## 2014-02-04 ENCOUNTER — Ambulatory Visit: Payer: Medicare Other | Admitting: Internal Medicine

## 2014-02-04 NOTE — Telephone Encounter (Signed)
I spoke to patient's husband and advised the patient to start taking baclofen 10 mg 3 times daily and call back on Monday if not better in speak to Dr. Terrace ArabiaYan for further directions.

## 2014-02-04 NOTE — Telephone Encounter (Signed)
Labs ok - no medication changes recommended by me (or Hopp)

## 2014-02-04 NOTE — Telephone Encounter (Signed)
Pt's husband called.  He states that the left side of his wife's face is pulling and twitching.  He said it started this morning.  He also said that the medicine prescribed doesn't seem to be working as well.  She also has pneumonia. Please call and advise as to what he should do.  The home # [phone] or cell # [phone].  Thank you

## 2014-02-04 NOTE — Telephone Encounter (Signed)
Phone call from Pt husband requesting lab results.   Call back number [phone]

## 2014-02-04 NOTE — Telephone Encounter (Signed)
Pt's husband Reita ClicheBobby calling stating that the pt's left side of her face is pulling and twitching and this started this morning. Husband also states that the medicine that was prescribed isn't helping and pt has pneumonia. Pt' husband would like for Dr. Pearlean BrownieSethi to give him a call back at [phone] or [phone]. Please advise

## 2014-02-04 NOTE — Telephone Encounter (Signed)
Patient has been advised.  She states her voice is not right. Her face is numb.

## 2014-02-05 NOTE — Telephone Encounter (Signed)
These are chronic concerns. Patient may schedule a return office visit if change in condition. Labs addressed by Dr. Alwyn RenHopper, see separate note

## 2014-02-07 ENCOUNTER — Encounter: Payer: Self-pay | Admitting: Podiatry

## 2014-02-07 ENCOUNTER — Ambulatory Visit (INDEPENDENT_AMBULATORY_CARE_PROVIDER_SITE_OTHER): Payer: Medicare Other | Admitting: Podiatry

## 2014-02-07 VITALS — BP 103/57 | HR 86 | Resp 16 | Ht 65.0 in | Wt 175.0 lb

## 2014-02-07 DIAGNOSIS — M79609 Pain in unspecified limb: Secondary | ICD-10-CM

## 2014-02-07 DIAGNOSIS — B351 Tinea unguium: Secondary | ICD-10-CM

## 2014-02-07 NOTE — Patient Instructions (Signed)
Diabetes and Foot Care Diabetes may cause you to have problems because of poor blood supply (circulation) to your feet and legs. This may cause the skin on your feet to become thinner, break easier, and heal more slowly. Your skin may become dry, and the skin may peel and crack. You may also have nerve damage in your legs and feet causing decreased feeling in them. You may not notice minor injuries to your feet that could lead to infections or more serious problems. Taking care of your feet is one of the most important things you can do for yourself.  HOME CARE INSTRUCTIONS  Wear shoes at all times, even in the house. Do not go barefoot. Bare feet are easily injured.  Check your feet daily for blisters, cuts, and redness. If you cannot see the bottom of your feet, use a mirror or ask someone for help.  Wash your feet with warm water (do not use hot water) and mild soap. Then pat your feet and the areas between your toes until they are completely dry. Do not soak your feet as this can dry your skin.  Apply a moisturizing lotion or petroleum jelly (that does not contain alcohol and is unscented) to the skin on your feet and to dry, brittle toenails. Do not apply lotion between your toes.  Trim your toenails straight across. Do not dig under them or around the cuticle. File the edges of your nails with an emery board or nail file.  Do not cut corns or calluses or try to remove them with medicine.  Wear clean socks or stockings every day. Make sure they are not too tight. Do not wear knee-high stockings since they may decrease blood flow to your legs.  Wear shoes that fit properly and have enough cushioning. To break in new shoes, wear them for just a few hours a day. This prevents you from injuring your feet. Always look in your shoes before you put them on to be sure there are no objects inside.  Do not cross your legs. This may decrease the blood flow to your feet.  If you find a minor scrape,  cut, or break in the skin on your feet, keep it and the skin around it clean and dry. These areas may be cleansed with mild soap and water. Do not cleanse the area with peroxide, alcohol, or iodine.  When you remove an adhesive bandage, be sure not to damage the skin around it.  If you have a wound, look at it several times a day to make sure it is healing.  Do not use heating pads or hot water bottles. They may burn your skin. If you have lost feeling in your feet or legs, you may not know it is happening until it is too late.  Make sure your health care provider performs a complete foot exam at least annually or more often if you have foot problems. Report any cuts, sores, or bruises to your health care provider immediately. SEEK MEDICAL CARE IF:   You have an injury that is not healing.  You have cuts or breaks in the skin.  You have an ingrown nail.  You notice redness on your legs or feet.  You feel burning or tingling in your legs or feet.  You have pain or cramps in your legs and feet.  Your legs or feet are numb.  Your feet always feel cold. SEEK IMMEDIATE MEDICAL CARE IF:   There is increasing redness,   swelling, or pain in or around a wound.  There is a red line that goes up your leg.  Pus is coming from a wound.  You develop a fever or as directed by your health care provider.  You notice a bad smell coming from an ulcer or wound. Document Released: 10/25/2000 Document Revised: 06/30/2013 Document Reviewed: 04/06/2013 Exit[employer] Patient Information 2014 [employer].  

## 2014-02-08 ENCOUNTER — Telehealth: Payer: Self-pay | Admitting: Internal Medicine

## 2014-02-08 NOTE — Telephone Encounter (Signed)
Rec'd from [facility] forward 8 pages to Dr. Alwyn RenHopper

## 2014-02-08 NOTE — Progress Notes (Signed)
Patient ID: Paula LarsenSusan R [name], female   DOB: July 10, 1927, 80 y.o.   MRN: [id number]  Subjective: Patient presents complaining of painful toenails. She has been a patient in this office for this problem since 2013 presents at approximately three-month intervals. Her last visit for this service was 11/10/2013  Objective: Orientated x3 female. Brittle, hypertrophic, discolored toenails x10 with palpable tenderness in the nails.   Assessment: Symptomatic onychomycoses x10   Plan: Debridement of toenails x10 without a bleeding.Reappoint intervals at 3 months

## 2014-02-08 NOTE — Telephone Encounter (Signed)
Rec'd from [facility] forward 8 pages to Dr. Hopper °

## 2014-02-10 ENCOUNTER — Ambulatory Visit: Payer: Medicare Other | Admitting: Neurology

## 2014-02-14 ENCOUNTER — Ambulatory Visit (INDEPENDENT_AMBULATORY_CARE_PROVIDER_SITE_OTHER): Payer: Medicare Other | Admitting: Internal Medicine

## 2014-02-14 ENCOUNTER — Encounter: Payer: Self-pay | Admitting: Internal Medicine

## 2014-02-14 VITALS — BP 112/68 | HR 104 | Temp 98.9°F | Wt 177.8 lb

## 2014-02-14 DIAGNOSIS — R5382 Chronic fatigue, unspecified: Secondary | ICD-10-CM

## 2014-02-14 DIAGNOSIS — E1159 Type 2 diabetes mellitus with other circulatory complications: Secondary | ICD-10-CM

## 2014-02-14 DIAGNOSIS — G9332 Myalgic encephalomyelitis/chronic fatigue syndrome: Secondary | ICD-10-CM

## 2014-02-14 DIAGNOSIS — M545 Low back pain, unspecified: Secondary | ICD-10-CM

## 2014-02-14 NOTE — Assessment & Plan Note (Signed)
Diet controlled until 02/2011 when a1c>7 - started metformin xr 500 qd Reduced dose 02/2014 due to chronic nausea On ARB, reports intol to statin years ago  Follows with optho - rankin Check a1c q6-5169mo, adjust as needed  Lab Results  Component Value Date   HGBA1C 7.4* 02/02/2014

## 2014-02-14 NOTE — Assessment & Plan Note (Signed)
Significant and chronic "sick" symptoms, overlap with anxiety and cognitive impairment (spouse and pt), likely contributing to chronic "unwell" symptoms  Review and reassurance provided to patient and spouse today Will refer to ID at [facility] at family request (per newspaper article on CFS) Continue same sertraline and scheduled BZ S/p counseling with gutterman 02/2013 x 3 OV, to follow up prn

## 2014-02-14 NOTE — Progress Notes (Signed)
Subjective:    Patient ID: Paula LarsenSusan R Giambra, female    DOB: [birth year], [age] y.o.   MRN: [id number]  HPI  Patient here today for follow up.  Chronic medical issues reviewed. She is requesting today referral to ID at [facility] for chronic fatigue syndrome as well as referral to PT.  Past Medical History  Diagnosis Date  . Depression   . Ejection fraction     EF 40%, echo, November, 2012  / in improved, EF 60%, echo, December, 2012  . Contrast media allergy     Patient feels poorly with contrast  . Status post AAA (abdominal aortic aneurysm) repair     Surgical repair, Dr. Hart RochesterLawson, December 27, 2011  . Pre-syncope     vasovagal w/ heart block  . Parotid mass     has refused further eval 10/2011  . GERD (gastroesophageal reflux disease)   . Anxiety   . Facial tic     L sided spasms - Botox trial summer 2013, ?effective  . Macular degeneration   . AAA (abdominal aortic aneurysm)     s/p repair 12/2011  . Congestive heart failure     NL EF 10/2011 echo   . Diabetes mellitus type II, controlled   . Hypothyroidism   . Sinus bradycardia 09/19/2011    Occurring simultaneously with complete heart block   . Hypertension   . COPD (chronic obstructive pulmonary disease)   . 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 Negative for activity change, appetite change and unexpected weight change.  Respiratory: Positive for cough (chronic, non-productive). Negative for chest tightness, shortness of breath and wheezing.   Cardiovascular: Negative for chest pain, palpitations and leg swelling.  Gastrointestinal: Negative for nausea, vomiting, diarrhea and constipation.  Neurological: Negative for dizziness and syncope.       Objective:   Physical Exam  Vitals reviewed. Constitutional: She is oriented to person, place, and time. She appears well-developed and well-nourished. No distress.  Neck: Normal range  of motion. Neck supple. No thyromegaly present.  Cardiovascular: Normal rate, regular rhythm and normal heart sounds.   No murmur heard. Pulmonary/Chest: Effort normal and breath sounds normal. No respiratory distress. She has no wheezes.  Abdominal: Soft. Bowel sounds are normal. She exhibits no distension. There is no tenderness. There is no rebound.  Musculoskeletal: Normal range of motion.  Lymphadenopathy:    She has no cervical adenopathy.  Neurological: She is alert and oriented to person, place, and time.  Skin: Skin is warm and dry. No rash noted. She is not diaphoretic.  Psychiatric: She has a normal mood and affect. Her behavior is normal.    Wt Readings from Last 3 Encounters:  02/14/14 177 lb 12.8 oz (80.65 kg)  02/07/14 175 lb (79.379 kg)  02/02/14 179 lb (81.194 kg)   BP Readings from Last 3 Encounters:  02/14/14 112/68  02/07/14 103/57  02/02/14 130/70   Lab Results  Component Value Date   WBC 14.1* 01/31/2014   HGB 11.4* 01/31/2014   HCT 34.1* 01/31/2014   PLT 267.0 01/31/2014   GLUCOSE 126* 01/12/2014   CHOL 153 11/07/2013   TRIG 282* 11/07/2013   HDL 28* 11/07/2013   LDLDIRECT 90.6 02/10/2013   LDLCALC 69 11/07/2013   ALT 17 01/12/2014   AST 14 01/12/2014   NA 141 01/12/2014   K 3.6 01/12/2014   CL 105 01/12/2014  CREATININE 1.0 01/12/2014   BUN 19 01/12/2014   CO2 30 01/12/2014   TSH 0.25* 02/02/2014   INR 1.03 11/06/2013   HGBA1C 7.4* 02/02/2014        Assessment & Plan:   Problem List Items Addressed This Visit   Chronic fatigue syndrome - Primary     Significant and chronic "sick" symptoms, overlap with anxiety and cognitive impairment (spouse and pt), likely contributing to chronic "unwell" symptoms  Review and reassurance provided to patient and spouse today Will refer to ID at [facility] at family request (per newspaper article on CFS) Continue same sertraline and scheduled BZ S/p counseling with gutterman 02/2013 x 3 OV, to follow up prn      Relevant Orders      Ambulatory referral to Infectious Disease   Lumbago     S/p eval and tx with Dr Retia Passe since late 06/2013 Encouraged to resume PT as previously scheduled -pt requests "[address]" location     Relevant Orders      Ambulatory referral to Physical Therapy   Type II or unspecified type diabetes mellitus with peripheral circulatory disorders, not stated as uncontrolled(250.70)      Diet controlled until 02/2011 when a1c>7 - started metformin xr 500 qd Reduced dose 02/2014 due to chronic nausea On ARB, reports intol to statin years ago  Follows with optho - rankin Check a1c q6-65mo, adjust as needed  Lab Results  Component Value Date   HGBA1C 7.4* 02/02/2014      Relevant Medications      metFORMIN (GLUCOPHAGE-XR) 500 MG 24 hr tablet     Time spent with pt/family today 25 minutes, greater than 50% time spent counseling patient on CFS, LBP and medication review. Also review of prior records

## 2014-02-14 NOTE — Patient Instructions (Signed)
It was good to see you today.  We have reviewed your prior records including labs and tests today  Medications reviewed and updated - no changes  we'll make referral to infectious disease at [facility] as requested. Our office will contact you regarding appointment(s) once made.  We'll also make referral to physical therapy on [address] as requested  Fatigue Fatigue is a feeling of tiredness, lack of energy, lack of motivation, or feeling tired all the time. Having enough rest, good nutrition, and reducing stress will normally reduce fatigue. Consult your caregiver if it persists. The nature of your fatigue will help your caregiver to find out its cause. The treatment is based on the cause.  CAUSES  There are many causes for fatigue. Most of the time, fatigue can be traced to one or more of your habits or routines. Most causes fit into one or more of three general areas. They are: Lifestyle problems  Sleep disturbances.  Overwork.  Physical exertion.  Unhealthy habits.  Poor eating habits or eating disorders.  Alcohol and/or drug use .  Lack of proper nutrition (malnutrition). Psychological problems  Stress and/or anxiety problems.  Depression.  Grief.  Boredom. Medical Problems or Conditions  Anemia.  Pregnancy.  Thyroid gland problems.  Recovery from major surgery.  Continuous pain.  Emphysema or asthma that is not well controlled  Allergic conditions.  Diabetes.  Infections (such as mononucleosis).  Obesity.  Sleep disorders, such as sleep apnea.  Heart failure or other heart-related problems.  Cancer.  Kidney disease.  Liver disease.  Effects of certain medicines such as antihistamines, cough and cold remedies, prescription pain medicines, heart and blood pressure medicines, drugs used for treatment of cancer, and some antidepressants. SYMPTOMS  The symptoms of fatigue include:   Lack of energy.  Lack of drive  (motivation).  Drowsiness.  Feeling of indifference to the surroundings. DIAGNOSIS  The details of how you feel help guide your caregiver in finding out what is causing the fatigue. You will be asked about your present and past health condition. It is important to review all medicines that you take, including prescription and non-prescription items. A thorough exam will be done. You will be questioned about your feelings, habits, and normal lifestyle. Your caregiver may suggest blood tests, urine tests, or other tests to look for common medical causes of fatigue.  TREATMENT  Fatigue is treated by correcting the underlying cause. For example, if you have continuous pain or depression, treating these causes will improve how you feel. Similarly, adjusting the dose of certain medicines will help in reducing fatigue.  HOME CARE INSTRUCTIONS   Try to get the required amount of good sleep every night.  Eat a healthy and nutritious diet, and drink enough water throughout the day.  Practice ways of relaxing (including yoga or meditation).  Exercise regularly.  Make plans to change situations that cause stress. Act on those plans so that stresses decrease over time. Keep your work and personal routine reasonable.  Avoid street drugs and minimize use of alcohol.  Start taking a daily multivitamin after consulting your caregiver. SEEK MEDICAL CARE IF:   You have persistent tiredness, which cannot be accounted for.  You have fever.  You have unintentional weight loss.  You have headaches.  You have disturbed sleep throughout the night.  You are feeling sad.  You have constipation.  You have dry skin.  You have gained weight.  You are taking any new or different medicines that  you suspect are causing fatigue.  You are unable to sleep at night.  You develop any unusual swelling of your legs or other parts of your body. SEEK IMMEDIATE MEDICAL CARE IF:   You are feeling  confused.  Your vision is blurred.  You feel faint or pass out.  You develop severe headache.  You develop severe abdominal, pelvic, or back pain.  You develop chest pain, shortness of breath, or an irregular or fast heartbeat.  You are unable to pass a normal amount of urine.  You develop abnormal bleeding such as bleeding from the rectum or you vomit blood.  You have thoughts about harming yourself or committing suicide.  You are worried that you might harm someone else. MAKE SURE YOU:   Understand these instructions.  Will watch your condition.  Will get help right away if you are not doing well or get worse. Document Released: 08/25/2007 Document Revised: 01/20/2012 Document Reviewed: 08/25/2007 [facility] Patient Information 2014 [employer] [place].

## 2014-02-14 NOTE — Assessment & Plan Note (Signed)
S/p eval and tx with Dr Retia PasseSaullo since late 06/2013 Encouraged to resume PT as previously scheduled -pt requests "[address]" location

## 2014-02-14 NOTE — Progress Notes (Signed)
Pre visit review using our clinic review tool, if applicable. No additional management support is needed unless otherwise documented below in the visit note. 

## 2014-02-16 ENCOUNTER — Ambulatory Visit (INDEPENDENT_AMBULATORY_CARE_PROVIDER_SITE_OTHER): Payer: Medicare Other | Admitting: Internal Medicine

## 2014-02-16 ENCOUNTER — Encounter: Payer: Self-pay | Admitting: Internal Medicine

## 2014-02-16 VITALS — BP 120/60 | HR 81 | Temp 98.2°F | Resp 15 | Wt 178.4 lb

## 2014-02-16 DIAGNOSIS — R110 Nausea: Secondary | ICD-10-CM

## 2014-02-16 DIAGNOSIS — R5381 Other malaise: Secondary | ICD-10-CM

## 2014-02-16 DIAGNOSIS — R5383 Other fatigue: Secondary | ICD-10-CM

## 2014-02-16 NOTE — Progress Notes (Signed)
Pre visit review using our clinic review tool, if applicable. No additional management support is needed unless otherwise documented below in the visit note. 

## 2014-02-16 NOTE — Progress Notes (Signed)
   Subjective:    Patient ID: Paula [name], female    DOB: 05-09-1927, 80 y.o.   MRN: [id number]  HPI   She continues to feel "sick". There is some component of nausea to this.  Recently her thyroid supplement dose was decreased as her TSH was subtherapeutic. A followup TSH would be done in 8-10 weeks  She has a history of some "drawing" of her left face; she was hospitalized and worked up for possible stroke. There was no evidence of stroke. She is unsure of any specific facial or lingual swelling.  She is on losartan HCTZ.  Her metformin was decreased from 500 XL  to 250 mg at bedtime.   Review of Systems  She denies unexplained weight loss, abdominal pain, significant dyspepsia, dysphagia, melena, rectal bleeding, or persistently small caliber stools.     Objective:   Physical Exam General appearance:adequate  nourishment w/o distress.  Eyes: No conjunctival inflammation or scleral icterus is present.  Oral exam: Dental hygiene is good; lips and gums are healthy appearing.There is no oropharyngeal erythema or exudate noted.   Heart:  Normal rate and regular rhythm. S1 and S2 normal without gallop, murmur, click, rub or other extra sounds     Lungs:Chest clear to auscultation; no wheezes, rhonchi,rales ,or rubs present.No increased work of breathing.   Abdomen: bowel sounds decreased, protuberant but soft and non-tender without masses, organomegaly or hernias noted.  No guarding or rebound .  Musculoskeletal: using rolling walker Skin:Warm & dry.  Intact without suspicious lesions or rashes ; no jaundice ;slight tenting  Lymphatic: No lymphadenopathy is noted about the head, neck, axilla   Neuropsych: Her history tends to be rambling and unfocused. She emphasizes her desire to be [facility]. She previously wanted evaluation of chronic fatigue; [facility] were [facility] at [facility] and rheumatology referral was not  accepted. She states she feels doctors feel she wants to be sick. She cannot understand  why all her tests are negative or normal               Assessment & Plan:  #1 chronic nausea #2 "face drawing" in context ARB. Negative CVA work up  Plan: I will recommend she discontinue the Mucinex totally ; unless having thick secretions. This often can cause nausea  I recommend a trial  the angiotensin receptor blocker/HCT  be held as her nonspecific GI symptoms may relate to this. Also she has had hypokalemia  I also recommend that the metformin be taken at the end of her largest meal.  Finally her TSH should be  repeated in approximately 8 weeks.

## 2014-02-16 NOTE — Patient Instructions (Signed)
   Please stop the losartan HCTZ and the potassium supplement; followup with Dr. Mady HaagensenLeschbar in 2 weeks. Stop Mucinex unless having thick secretions. Take the metformin at the very end of your largest meal.

## 2014-02-18 ENCOUNTER — Ambulatory Visit (INDEPENDENT_AMBULATORY_CARE_PROVIDER_SITE_OTHER): Payer: Medicare Other | Admitting: Family Medicine

## 2014-02-18 VITALS — BP 120/70 | HR 77 | Temp 98.7°F | Resp 18 | Wt 173.0 lb

## 2014-02-18 DIAGNOSIS — R05 Cough: Secondary | ICD-10-CM

## 2014-02-18 DIAGNOSIS — J029 Acute pharyngitis, unspecified: Secondary | ICD-10-CM

## 2014-02-18 DIAGNOSIS — R059 Cough, unspecified: Secondary | ICD-10-CM

## 2014-02-18 LAB — POCT RAPID STREP A (OFFICE): RAPID STREP A SCREEN: NEGATIVE

## 2014-02-18 NOTE — Progress Notes (Addendum)
[facility] [address], [place] [id number] [phone]  Date:  02/18/2014   Name:  Duard LarsenSusan R Maday   DOB:  14-Jun-1927   MRN:  [id number]  PCP:  Rene PaciValerie Leschber, MD    Chief Complaint: Jaw Pain and Sore Throat   History of Present Illness:  Duard LarsenSusan R Kedzierski is a 80 y.o. very pleasant female patient who presents with the following:  She is here today as a new patient.  She has multiple medical problems.  She has a history of some sort of problem with her face which is difficult for her to describe.  She is being [facility] per her report by her PCP.  "I've been to the doctor or the hospital 50 times and no one can figure it out.  They're just not looking hard enough."  She had her husband saw an article about a [facility] doctor [facility]; they state they have spoken to her on the phone already and plan to see her.  It appears this doctor is working with chronic fatigue patients.   She saw Dr. Marga MelnickWilliam Hopper 2 days ago, and 2 days prior to that she saw Dr. Felicity CoyerLeschber  Here today because she "just wants someone to figure out what is wrong with my face."  There is no acute change today per her report; however her husband states she had complaint of a sore throat this mornig.  Also, she has noted a mild cough for a couple of days.  No fever. She notes that her face feels "like it's pulling."  She has noted this for about one year. However some notes indicate this has been going on for several years.     History of COPD and her sats have been around 90% recently.  Reviewed several notes and this seems her baseline.    Wt Readings from Last 3 Encounters:  02/18/14 173 lb (78.472 kg)  02/16/14 178 lb 6.4 oz (80.922 kg)  02/14/14 177 lb 12.8 oz (80.65 kg)     Patient Active Problem List   Diagnosis Date Noted  . Type II or unspecified type diabetes mellitus with peripheral circulatory disorders, not stated as uncontrolled(250.70) 02/02/2014  . Chronic fatigue  syndrome   . Pleuritic chest pain 01/03/2014  . Chronic nausea   . TIA (transient ischemic attack) 11/06/2013  . Allergic rhinitis 10/12/2013  . Lumbago   . Facial spasm 04/08/2013  . Dementia 03/08/2013  . Headache 02/19/2013  . Chronic diastolic CHF (congestive heart failure) 02/11/2013  . Chronic asthmatic bronchitis with acute exacerbation 01/27/2013  . Hypertension   . Recurrent pneumonia   . AAA (abdominal aortic aneurysm) 07/07/2012  . Status post AAA (abdominal aortic aneurysm) repair   . Pre-syncope   . Depression 10/25/2011  . Anxiety 10/25/2011  . Facial twitching 10/13/2011  . Ejection fraction   . Contrast media allergy   . Hypothyroidism 09/19/2011  . Sinus bradycardia 09/19/2011  . Parotid mass 09/18/2011  . COPD (chronic obstructive pulmonary disease) with emphysema 09/15/2011  . Diabetes mellitus type II, controlled 09/15/2011    Past Medical History  Diagnosis Date  . Depression   . Ejection fraction     EF 40%, echo, November, 2012  / in improved, EF 60%, echo, December, 2012  . Contrast media allergy     Patient feels poorly with contrast  . Status post AAA (abdominal aortic aneurysm) repair     Surgical repair, Dr. Hart RochesterLawson, December 27, 2011  . Pre-syncope     vasovagal w/ heart block  . Parotid mass     has refused further eval 10/2011  . GERD (gastroesophageal reflux disease)   . Anxiety   . Facial tic     L sided spasms - Botox trial summer 2013, ?effective  . Macular degeneration   . AAA (abdominal aortic aneurysm)     s/p repair 12/2011  . Congestive heart failure     NL EF 10/2011 echo   . Diabetes mellitus type II, controlled   . Hypothyroidism   . Sinus bradycardia 09/19/2011    Occurring simultaneously with complete heart block   . Hypertension   . COPD (chronic obstructive pulmonary disease)   . Facial spasm     L hemifacial-treated w/ botox xeomin -Dr Terrace Arabia    Past Surgical History  Procedure Laterality Date  . Cholecystectomy     . Knee cartilage surgery      left  . Sinus surgery with instatrak    . Appendectomy    . Oophorectomy      ovarian cyst  . Cataract extraction      bilateral  . Bladder repair    . US echocardiography  10/14/11  . Abdominal aortic aneurysm repair  12/27/2011    Procedure: ANEURYSM ABDOMINAL AORTIC REPAIR;  Surgeon: Josephina Gip, MD;  Location: [facility] OR;  Service: Vascular;  Laterality: N/A;  Resection and Grafting Abdominal Aortic Aneurysm , Aorta Bi Iliac.  Marland Kitchen Pacemaker insertion  11/12  . Cardiac catheterization  11/04/11    History  Substance Use Topics  . Smoking status: Former Smoker -- 1.00 packs/day for 30 years    Types: Cigarettes    Quit date: 08/31/2011  . Smokeless tobacco: Never Used  . Alcohol Use: No    Family History  Problem Relation Age of Onset  . Esophageal cancer Father   . Cancer Father   . Breast cancer Sister   . Cancer Sister   . Colon cancer Sister   . Heart disease Sister   . Heart disease Mother   . Heart disease Son     Allergies  Allergen Reactions  . Bactrim [Sulfamethoxazole-Tmp Ds]   . Contrast Media [Iodinated Diagnostic Agents] Anaphylaxis  . Gadolinium Derivatives   . Iohexol Anaphylaxis, Shortness Of Breath and Swelling     Desc: PT STATES SHE HAD A SEVERE REACTION TO IV CONRAST WITH THROAT SWELLING AND SOB. SHE WAS ADMITTED TO THE HOSPITAL. SHE HAS NEVER HAD CONTRAST AGAIN.   Marland Kitchen Penicillins Rash  . Sulfa Antibiotics Anaphylaxis  . Avelox [Moxifloxacin Hcl In Nacl] Rash  . Ciprofloxacin Itching  . Delsym [Dextromethorphan] Other (See Comments)    Hallucination  . Hydromet [Hydrocodone-Homatropine] Other (See Comments)    Pt states med make her hallucinate    Medication list has been reviewed and updated.  Current Outpatient Prescriptions on File Prior to Visit  Medication Sig Dispense Refill  . acetaminophen (TYLENOL) 500 MG tablet Take 500 mg by mouth every 6 (six) hours as needed (pain).      Marland Kitchen albuterol-ipratropium  (COMBIVENT) 18-103 MCG/ACT inhaler Inhale 2 puffs into the lungs every 6 (six) hours as needed for wheezing or shortness of breath.      . ALPRAZolam (XANAX) 0.5 MG tablet Take 1 tablet (0.5 mg total) by mouth 3 (three) times daily.  90 tablet  5  . aspirin (ECOTRIN LOW STRENGTH) 81 MG EC tablet Take 1 tablet (81 mg total) by mouth daily.  Swallow whole.  30 tablet  12  . baclofen (LIORESAL) 10 MG tablet Take 10 mg by mouth 3 (three) times daily.      . calcium carbonate (OS-CAL) 600 MG TABS Take 600 mg by mouth every other day.       . carvedilol (COREG) 6.25 MG tablet Take 6.25 mg by mouth 2 (two) times daily with a meal.      . cetirizine (ZYRTEC) 10 MG tablet Take 10 mg by mouth daily as needed for allergies.      . Cholecalciferol (VITAMIN D3) 5000 UNITS TABS Take 5,000 Units by mouth every other day.       . furosemide (LASIX) 20 MG tablet Take 20 mg by mouth.      . gabapentin (NEURONTIN) 100 MG capsule Take 100 mg by mouth at bedtime.      Marland Kitchen guaiFENesin (MUCINEX) 600 MG 12 hr tablet Take 600 mg by mouth 2 (two) times daily.      Marland Kitchen levothyroxine (SYNTHROID, LEVOTHROID) 175 MCG tablet Take 1 tablet (175 mcg total) by mouth daily before breakfast. 02/03/14:1 daily EXCEPT 1/2 on Tues & Thurs; check TSH 10 weeks      . metFORMIN (GLUCOPHAGE-XR) 500 MG 24 hr tablet Take 250 mg by mouth at bedtime.      . mometasone (NASONEX) 50 MCG/ACT nasal spray Place 2 sprays into the nose daily as needed (allergies).      . ondansetron (ZOFRAN) 4 MG tablet Take 4 mg by mouth every 8 (eight) hours as needed for nausea or vomiting.      . pantoprazole (PROTONIX) 40 MG tablet Take 40 mg by mouth daily.      Bertram Gala Glycol-Propyl Glycol (SYSTANE) 0.4-0.3 % SOLN Apply 1 drop to eye 2 (two) times daily as needed (dry eyes).      . potassium chloride SA (K-DUR,KLOR-CON) 20 MEQ tablet Take 20 mEq by mouth daily.      . sertraline (ZOLOFT) 50 MG tablet Take 1 tablet (50 mg total) by mouth daily. 1/2 qd      .  losartan-hydrochlorothiazide (HYZAAR) 50-12.5 MG per tablet Take 1 tablet by mouth  daily  90 tablet  3   No current facility-administered medications on file prior to visit.    Review of Systems:  As per HPI- otherwise negative.   Physical Examination: Filed Vitals:   02/18/14 1352  BP: 100/60  Pulse: 77  Temp: 98.7 F (37.1 C)  Resp: 18   Filed Vitals:   02/18/14 1352  Weight: 173 lb (78.472 kg)   Body mass index is 28.79 kg/(m^2). Ideal Body Weight:    GEN: WDWN, NAD, Non-toxic, A & O x 3, obese, here today with her husband.   HEENT: Atraumatic, Normocephalic. Neck supple. No masses, No LAD.  No apparent facial droop or asymmetry. Moves all facial muscles normally.    Bilateral TM wnl, oropharynx normal.  PEERL,EOMI.   Ears and Nose: No external deformity. CV: RRR, No M/G/R. No JVD. No thrill. No extra heart sounds. PULM: CTA B, no wheezes, crackles, rhonchi. No retractions. No resp. distress. No accessory muscle use. ABD: S, NT, ND, +BS. No rebound. No HSM. EXTR: clubbing of fingers is present c/w COPD.  NEURO slow gait but wnl for age, uses a walker  PSYCH: somewhat rambling and disjointed history.  No psychosis  Results for orders placed in visit on 02/18/14  POCT RAPID STREP A (OFFICE)      Result Value Ref Range  Rapid Strep A Screen Negative  Negative    Assessment and Plan: Acute pharyngitis - Plan: POCT rapid strep A  Cough  Josceline is here today asking for a diagnosis for her chronic sx of malaise and facial discomfort.  Explained that I am sorry but I do not have any definite answer for her today.  She has been extensively evaluated.  Discussed the fact that we cannot always determine a cause for every sx and that her work- up so far is reassuring.  She does plan to see the doctor at [facility] who she has read about.  They state there is nothing else that they need for me to do today.  Signed Abbe Amsterdam, MD

## 2014-02-18 NOTE — Patient Instructions (Signed)
I'm sorry that I do not have any new answers regarding your chronic facial complaint.   I would encourage you to see your regular doctor for regularly planned follow-up

## 2014-02-19 ENCOUNTER — Other Ambulatory Visit: Payer: Self-pay | Admitting: Internal Medicine

## 2014-02-22 ENCOUNTER — Telehealth: Payer: Self-pay | Admitting: Internal Medicine

## 2014-02-22 ENCOUNTER — Other Ambulatory Visit: Payer: Self-pay | Admitting: *Deleted

## 2014-02-22 NOTE — Telephone Encounter (Signed)
Patient Information:  Caller Name: Darl PikesSusan  Phone: [phone]  Patient: Paula [name], [name]  Gender: Female  DOB: 1927/03/01  Age: 80 Years  PCP: Rene PaciLeschber, Valerie (Adults only)  Office Follow Up:  Does the office need to follow up with this patient?: Yes  Instructions For The Office: PLEASE REVIEW PATIENT HISTORY AND COMPLAINTS.  Husband states she is better and was recently hospitalized with negative stroke work up. PLEASE CONTACT TRIAGE REFERS TO ED vs OFFICE APPT.  RN Note:  PLEASE REVIEW PATIENT HISTORY AND COMPLAINTS.  Husband states she is better and was recently hospitalized with negative stroke work up. PLEASE CONTACT TRIAGE REFERS TO ED vs OFFICE APPT.  Symptoms  Reason For Call & Symptoms: "My face drawing  and I could not get up this morning".  Patient reports that she has was hospitalized two months ago for stroke like symptoms but did not have one. (1)  she states her head feels full (2) speech is slow and slurred .  Husband to phone states that she has been tested for a stroke. He states that currently the face is not drawn but she "states my face feels like it is pulling".. She is ambulating with her walker.  She has went to bathroom and bathed. she has had coffee as well.  Reviewed Health History In EMR: Yes  Reviewed Medications In EMR: Yes  Reviewed Allergies In EMR: Yes  Reviewed Surgeries / Procedures: Yes  Date of Onset of Symptoms: 02/22/2014  Guideline(s) Used:  Neurologic Deficit  Disposition Per Guideline:   Go to ED Now (or to Office with PCP Approval)  Reason For Disposition Reached:   Can't walk or can barely walk  Advice Given:  Call Back If:  Symptoms do not go away within 30 minutes  You become worse.  RN Overrode Recommendation:  Go To ED  PLEASE REVIEW PATIENT HISTORY AND COMPLAINTS.  Husband states she is better and was recently hospitalized with negative stroke work up. PLEASE CONTACT TRIAGE REFERS TO ED vs OFFICE APPT.

## 2014-02-23 ENCOUNTER — Other Ambulatory Visit (INDEPENDENT_AMBULATORY_CARE_PROVIDER_SITE_OTHER): Payer: Medicare Other

## 2014-02-23 ENCOUNTER — Ambulatory Visit (INDEPENDENT_AMBULATORY_CARE_PROVIDER_SITE_OTHER): Payer: Medicare Other | Admitting: Internal Medicine

## 2014-02-23 ENCOUNTER — Encounter: Payer: Self-pay | Admitting: Internal Medicine

## 2014-02-23 VITALS — BP 108/68 | HR 82 | Temp 98.3°F | Wt 181.0 lb

## 2014-02-23 DIAGNOSIS — R259 Unspecified abnormal involuntary movements: Secondary | ICD-10-CM

## 2014-02-23 DIAGNOSIS — R413 Other amnesia: Secondary | ICD-10-CM

## 2014-02-23 LAB — CALCIUM: Calcium: 10.1 mg/dL (ref 8.4–10.5)

## 2014-02-23 LAB — MAGNESIUM: MAGNESIUM: 2.3 mg/dL (ref 1.5–2.5)

## 2014-02-23 LAB — POTASSIUM: Potassium: 3.7 mEq/L (ref 3.5–5.1)

## 2014-02-23 NOTE — Progress Notes (Signed)
Pre visit review using our clinic review tool, if applicable. No additional management support is needed unless otherwise documented below in the visit note. 

## 2014-02-23 NOTE — Telephone Encounter (Signed)
Advise OV with neuro

## 2014-02-23 NOTE — Progress Notes (Signed)
   Subjective:    Patient ID: Paula LarsenSusan R Bellotti, female    DOB: 21-Aug-1927, 80 y.o.   MRN: [id number]  HPI Yesterday, 02/22/14 she reports her left facial spasms were noticed upon waking.  She states the twitching was constant throughout the day. Baclofen was of some relief during the episodes.  Her husband was with her; LOC was specifically denied. She states she has difficulty finding her words at times, although this is not specifically associated with the facial twitching episodes.   [facility]14.  She reports fatigue, no other acute issues are presented at this time.  Her husband indicates that she has recently had difficulty remembering their son's name.   Her lab work was reviewed; last K+ level = 3.6, Ca = 9.6 on 01/12/14.   Review of Systems No associated fever, chills, sweats, or weight loss are described. Chest pain, palpitations, edema, calf pain, or claudication are denied. Cough, dyspnea, sputum production, or hemoptysis are absent. There is no associated back pain with anterior radiation of discomfort. No associated weakness, numbness/tingling in arms. Absent are dyspepsia or dysphagia. No changes in skin temperature or color in the area of the symptoms. Rash or skin lesions absent. No history of abnormal bruising or bleeding.    Objective:   Physical Exam Physical Exam General appearance:adequate nourishment w/o distress.  Eyes: No conjunctival inflammation or scleral icterus is present.  Oral exam: Dental hygiene is good; lips and gums are healthy appearing.There is no oropharyngeal erythema or exudate noted.  Heart: Normal rate and regular rhythm. S1 and S2 normal without gallop, murmur, click, rub or other extra sounds  Lungs:Chest clear to auscultation; no wheezes, rhonchi,rales ,or rubs present.No increased work of breathing.  Abdomen: bowel sounds normal, soft and non-tender without masses, organomegaly or hernias  noted. No guarding or rebound .  Musculoskeletal: using rolling walker  Skin:Warm & dry. Intact without suspicious lesions or rashes ; no jaundice ;slight tenting  Lymphatic: No lymphadenopathy is noted about the head, neck, axilla  Neuro: MMS 15/30; clock drawing test - insufficient; CN II-XII grossly intact; slight L facial twitch noted.      Assessment & Plan:  #1 facial twitch - check Magnesium level, medication reconciliation for iatrogenic cause? Carotid doppler studies?

## 2014-02-23 NOTE — Patient Instructions (Addendum)
Your next office appointment will be determined based upon review of your pending labs . 

## 2014-02-23 NOTE — Progress Notes (Signed)
   Subjective:    Patient ID: Paula LarsenSusan R Meggett, female    DOB: May 28, 1927, 80 y.o.   MRN: [id number]  HPI Yesterday, 02/22/14 she reports her left facial spasms were noticed upon waking.  She states the twitching was constant throughout the day. Baclofen was of some relief during the episodes. Botox has been administered previously. Spasms were evaluated as inpatient 12/14; no CVA or TIA documented. Her husband was with her; LOC was specifically denied. She states she has difficulty finding her words at times, although this is not specifically associated with the facial twitching episodes.  She is scheduled to [facility] on 03/15/14.  She reports fatigue, no other acute issues are presented at this time.  Her husband indicates that she has recently had difficulty remembering their son's name.  Her lab work was reviewed; last K+ level = 3.6, Ca = 9.6 on 01/12/14.   As instructed they stopped the losartan HCTZ; they have not stopped the potassium supplement.  She has neurology followup appointments with Dr. Terrace ArabiaYan later this month and with Dr. Marylou Flesherohmier in May    Review of Systems No associated fever, chills, sweats, or weight loss are described.  Chest pain, palpitations, edema, calf pain, or claudication are denied.  Cough, dyspnea, sputum production, or hemoptysis are absent.  There is no associated back pain with anterior radiation of discomfort.  No associated weakness, numbness/tingling in arms.  Absent are dyspepsia or dysphagia.  No changes in skin temperature or color in the area of the symptoms.  Rash or skin lesions absent.  No history of abnormal bruising or bleeding.     Objective:   Physical Exam General appearance:adequate nourishment w/o distress.  Eyes: No conjunctival inflammation or scleral icterus is present.  Oral exam: Dental hygiene is good; lips and gums are healthy appearing; dry mouth.There is no oropharyngeal erythema or exudate noted.  Heart:  Normal rate and regular rhythm. S1 and S2 normal without gallop, murmur, click, rub or other extra sounds  Lungs:Chest clear to auscultation; no wheezes, rhonchi,rales ,or rubs present.No increased work of breathing.  Abdomen: bowel sounds normal, soft, protuberant without masses, organomegaly or hernias noted. No guarding or rebound.  Musculoskeletal: using rolling walker  Skin:Warm & dry. Intact without suspicious lesions or rashes ; no jaundice Lymphatic: No lymphadenopathy is noted about the head, neck, axilla  Neuro: MMS 15/30; clock drawing test - insufficient; CN II-XII grossly intact; slight L facial twitch noted. Strength good in extremities. DTRs equal but decreased in legs. Using rolling walker     Assessment & Plan:  #1 facial twitch - check Magnesium ,Ca++, & K+ level, medication reconciliation for iatrogenic cause #2 memory deficit documented ; ? Alsheimer's. Neurology F/U pending #3 HTN controlled off ARB/HCT See orders

## 2014-02-24 ENCOUNTER — Encounter: Payer: Self-pay | Admitting: Internal Medicine

## 2014-02-24 ENCOUNTER — Ambulatory Visit (INDEPENDENT_AMBULATORY_CARE_PROVIDER_SITE_OTHER): Payer: Medicare Other | Admitting: Internal Medicine

## 2014-02-24 VITALS — BP 152/90 | HR 70 | Temp 97.6°F | Resp 14 | Wt 179.8 lb

## 2014-02-24 DIAGNOSIS — F0390 Unspecified dementia without behavioral disturbance: Secondary | ICD-10-CM

## 2014-02-24 DIAGNOSIS — R1013 Epigastric pain: Secondary | ICD-10-CM

## 2014-02-24 MED ORDER — SUCRALFATE 1 GM/10ML PO SUSP: ORAL | Status: DC

## 2014-02-24 NOTE — Patient Instructions (Signed)
Stay on clear liquids for 48-72 hours or until abdominal symptoms gone.This would include  jello, sherbert (NOT ice cream), Lipton's chicken noodle soup(NOT cream based soups),Gatorade Lite, flat Ginger ale (without High Fructose Corn Syrup),dry toast or crackers, baked potato.No milk , dairy or grease until back to normal. Align , a [employer] Biotic , daily if stools are loose.

## 2014-02-24 NOTE — Progress Notes (Signed)
   Subjective:    Patient ID: Paula LarsenSusan R Kurtenbach, female    DOB: 1927/04/12, 80 y.o.   MRN: [id number]  HPI  Symptoms began last night as epigastric discomfort. She is unable to characterize or quantitate the pain. It's intermittent. She has difficulty exprssing me how long it lasted. Finally she stated the pain lasts 2-3 minutes but intermittent as noted. There was some radiation laterally in upper abdomen but no back radiation.  There's been some associated loss of appetite.  She had nausea without associated vomiting.  She had a bowel movement last night but did not visualize the. Stools were loose.  She has been seen by the gastroenterology; her last colonoscopy was 6 years ago by history.   Review of Systems  She denies dysphagia, heartburn, hematemesis, fever, or chills.  There's no dysuria, pyuria, or hematuria.  She has chronic back pain in context of DDD       Objective:   Physical Exam General appearance is one of good health and nourishment w/o distress.  Eyes: No conjunctival inflammation or scleral icterus is present.  Oral exam: Dental hygiene is good; lips and gums are healthy appearing.There is no oropharyngeal erythema or exudate noted.   Heart:  Normal rate and regular rhythm. S1 and S2 normal without gallop, murmur, click, rub or other extra sounds     Lungs:Chest clear to auscultation; no wheezes, rhonchi,rales ,or rubs present.No increased work of breathing.   Abdomen: bowel sounds normal, soft and non-tender without masses, organomegaly or hernias noted.  No guarding or rebound . I can not appreciate an AAA  Musculoskeletal: Rolling walker  Skin:Warm & dry.  Intact without suspicious lesions or rashes ; no jaundice or tenting  Lymphatic: No lymphadenopathy is noted about the head, neck, axilla   She has difficulty providing the date; she finally got that and the day of the month correct. She becomes agitated when she discusses her chronic illnesses  particularly the  nausea. She states that "no one believes me"               Assessment & Plan:  #1 ill-defined epigastric pain. This is in the context of recurrent, variable, multi-systems complaints &  dementia.  Plan: See orders

## 2014-02-24 NOTE — Progress Notes (Signed)
Pre visit review using our clinic review tool, if applicable. No additional management support is needed unless otherwise documented below in the visit note. 

## 2014-02-26 ENCOUNTER — Encounter (HOSPITAL_COMMUNITY): Payer: Self-pay | Admitting: Emergency Medicine

## 2014-02-26 ENCOUNTER — Emergency Department (INDEPENDENT_AMBULATORY_CARE_PROVIDER_SITE_OTHER)
Admission: EM | Admit: 2014-02-26 | Discharge: 2014-02-26 | Disposition: A | Discharge status: Home / Self Care | Payer: Medicare Other | Source: Home / Self Care | Attending: Family Medicine | Admitting: Family Medicine

## 2014-02-26 DIAGNOSIS — G518 Other disorders of facial nerve: Secondary | ICD-10-CM

## 2014-02-26 DIAGNOSIS — G5139 Clonic hemifacial spasm, unspecified: Secondary | ICD-10-CM

## 2014-02-26 NOTE — ED Provider Notes (Signed)
Paula LarsenSusan R [name] is a 80 y.o. female who presents to Urgent Care today for left-sided facial spasms. Patient has a long history of left-sided facial spasms. This is been present despite treatment with the baclofen and low dose gabapentin. She has had evaluation by multiple providers including neurology, PMNR, and primary care provider. She is an appointment scheduled in about 10 days with [facility]. Her symptoms were quite bothersome this morning and she presented to the urgent care today for evaluation. She notes the symptoms have improved since she's been here. She has not tried any medications. She denies any new weakness or numbness loss of function dizziness. She feels well otherwise.  Past Medical History  Diagnosis Date  . Depression   . Ejection fraction     EF 40%, echo, November, 2012  / in improved, EF 60%, echo, December, 2012  . Contrast media allergy     Patient feels poorly with contrast  . Status post AAA (abdominal aortic aneurysm) repair     Surgical repair, Dr. Hart RochesterLawson, December 27, 2011  . Pre-syncope     vasovagal w/ heart block  . Parotid mass     has refused further eval 10/2011  . GERD (gastroesophageal reflux disease)   . Anxiety   . Facial tic     L sided spasms - Botox trial summer 2013, ?effective  . Macular degeneration   . AAA (abdominal aortic aneurysm)     s/p repair 12/2011  . Congestive heart failure     NL EF 10/2011 echo   . Diabetes mellitus type II, controlled   . Hypothyroidism   . Sinus bradycardia 09/19/2011    Occurring simultaneously with complete heart block   . Hypertension   . COPD (chronic obstructive pulmonary disease)   . Facial spasm     L hemifacial-treated w/ botox xeomin -Dr Terrace ArabiaYan   History  Substance Use Topics  . Smoking status: Former Smoker -- 1.00 packs/day for 30 years    Types: Cigarettes    Quit date: 08/31/2011  . Smokeless tobacco: Never Used  . Alcohol Use: No   ROS as  above Medications: No current facility-administered medications for this encounter.   Current Outpatient Prescriptions  Medication Sig Dispense Refill  . acetaminophen (TYLENOL) 500 MG tablet Take 500 mg by mouth every 6 (six) hours as needed (pain).      Marland Kitchen. ALPRAZolam (XANAX) 0.5 MG tablet Take 1 tablet (0.5 mg total) by mouth 3 (three) times daily.  90 tablet  5  . aspirin (ECOTRIN LOW STRENGTH) 81 MG EC tablet Take 1 tablet (81 mg total) by mouth daily. Swallow whole.  30 tablet  12  . baclofen (LIORESAL) 10 MG tablet Take 10 mg by mouth 3 (three) times daily.      . calcium carbonate (OS-CAL) 600 MG TABS Take 600 mg by mouth every other day.       . carvedilol (COREG) 6.25 MG tablet Take 6.25 mg by mouth 2 (two) times daily with a meal.      . cetirizine (ZYRTEC) 10 MG tablet Take 10 mg by mouth daily as needed for allergies.      . Cholecalciferol (VITAMIN D3) 5000 UNITS TABS Take 5,000 Units by mouth every other day.       . furosemide (LASIX) 20 MG tablet Take 20 mg by mouth.      . gabapentin (NEURONTIN) 100 MG capsule Take 100 mg by mouth at bedtime.      .Marland Kitchen  levothyroxine (SYNTHROID, LEVOTHROID) 175 MCG tablet Take 1 tablet (175 mcg total) by mouth daily before breakfast. 02/03/14:1 daily EXCEPT 1/2 on Tues & Thurs; check TSH 10 weeks      . losartan-hydrochlorothiazide (HYZAAR) 50-12.5 MG per tablet Take 1 tablet by mouth  daily  90 tablet  3  . metFORMIN (GLUCOPHAGE-XR) 500 MG 24 hr tablet Take 250 mg by mouth at bedtime.      . mometasone (NASONEX) 50 MCG/ACT nasal spray Place 2 sprays into the nose daily as needed (allergies).      . pantoprazole (PROTONIX) 40 MG tablet Take 40 mg by mouth daily.      Bertram Gala. Polyethyl Glycol-Propyl Glycol (SYSTANE) 0.4-0.3 % SOLN Apply 1 drop to eye 2 (two) times daily as needed (dry eyes).      . potassium chloride SA (K-DUR,KLOR-CON) 20 MEQ tablet Take 1 tablet by mouth  daily  90 tablet  3  . sertraline (ZOLOFT) 50 MG tablet Take 1 tablet (50 mg total)  by mouth daily. 1/2 qd      . sucralfate (CARAFATE) 1 GM/10ML suspension 10 cc (2 tsp)  tid ac  210 mL  0  . albuterol-ipratropium (COMBIVENT) 18-103 MCG/ACT inhaler Inhale 2 puffs into the lungs every 6 (six) hours as needed for wheezing or shortness of breath.      . guaiFENesin (MUCINEX) 600 MG 12 hr tablet Take 600 mg by mouth 2 (two) times daily.      . ondansetron (ZOFRAN) 4 MG tablet Take 4 mg by mouth every 8 (eight) hours as needed for nausea or vomiting.        Exam:  BP 139/68  Pulse 75  Temp(Src) 98.6 F (37 C) (Oral)  Resp 18  SpO2 95% Gen: Well NAD HEENT: EOMI,  MMM no apparent left-sided facial spasm on exam Lungs: Normal work of breathing. CTABL Heart: RRR no MRG Abd: NABS, Soft. NT, ND Exts: Brisk capillary refill, warm and well perfused.  Neuro: Cranial nerves are intact normal coordination  No results found for this or any previous visit (from the past 24 hour(s)). No results found.  Assessment and Plan: 80 y.o. female with left-sided facial spasm. Unclear etiology. Patient has had extensive workup at this point. I do not know what is causing her symptoms at this time. She will see a specialist in a few days.  At this point I feel that trials of further medication are likely to not be very helpful. Amitriptyline may be beneficial but I fear the side effects would be worse than the benefit. Plan to restart low-dose gabapentin with titration. Followup with neurologist soon.  Discussed warning signs or symptoms. Please see discharge instructions. Patient expresses understanding.    [name], MD 02/26/14 [phone]

## 2014-02-26 NOTE — Discharge Instructions (Signed)
Thank you for coming in today. Resume gabapentin.  Take one pill at night for a few days then increase to 2 pills at night.  Follow up with the specialist at [facility] soon.  Dystonias The dystonias are movement disorders in which sustained muscle contractions cause twisting and repetitive movements or abnormal postures. The movements, which are involuntary and sometimes painful, may affect a single muscle; a group of muscles such as those in the arms, legs, or neck; or the entire body. Early symptoms (problems) may include a deterioration in handwriting after writing several lines, foot cramps, and a tendency of one foot to pull up or drag after running or walking some distance. Other possible symptoms are tremor and voice or speech difficulties. Birth injury (particularly due to lack of oxygen), certain infections, reactions to certain drugs, heavy-metal or carbon monoxide poisoning, trauma (damage caused by an accident), or stroke can cause dystonic symptoms. About half the cases of dystonia have no connection to disease or injury and are called primary or idiopathic dystonia. Of the primary dystonias, many cases appear to be inherited in a dominant manner. Dystonias can also be symptoms of other diseases, some of which may be hereditary (passed down from parents). In some individuals, symptoms of a dystonia appear spontaneously in childhood between the ages of 62 and 26, usually in the foot or in the hand. For other individuals, the symptoms emerge in late adolescence or early adulthood. TREATMENT  No one treatment has been found universally effective for dystonia. Instead, physicians use a variety of therapies (medications, surgery and other treatments such as physical therapy, splinting, stress management, and biofeedback), aimed at reducing or eliminating muscle spasms and pain. Since response to drugs varies among patients and even in the same person over time, the therapy must be  individualized. PROGNOSIS The initial symptoms can be very mild and may be noticeable only after prolonged exertion, stress, or fatigue. Over a period of time, the symptoms may become more noticeable and widespread and be unrelenting; sometimes, however, there is little or no progression. RESEARCH BEING DONE Investigators believe that the dystonias result from an abnormality in an area of the brain called the basal ganglia, where some of the messages that initiate muscle contractions are processed. Scientists suspect a defect in the body's ability to process a group of chemicals called neurotransmitters that help cells in the brain communicate with each other. Scientists at the [facility] laboratories have conducted detailed investigations of the pattern of muscle activity in persons with dystonias. Studies using EEG analysis and neuroimaging are probing brain activity. The search for the gene or genes responsible for some forms of dominantly inherited dystonias continues. In 1989, a team of researchers mapped a gene for early-onset torsion dystonia to chromosome 9; the gene was subsequently named DYT1. In 1997, the team sequenced the DYT1 gene and found that it codes for a previously unknown protein now called "torsin A." Document Released: 10/18/2002 Document Revised: 01/20/2012 Document Reviewed: 10/28/2005 [facility] Patient Information 2014 [place].

## 2014-02-26 NOTE — ED Notes (Signed)
C/o  Facial twitching.   Recently seen by primary doctor for same problem.   Neuro follows ups are scheduled later in the month.

## 2014-02-28 ENCOUNTER — Ambulatory Visit (INDEPENDENT_AMBULATORY_CARE_PROVIDER_SITE_OTHER): Payer: Medicare Other | Admitting: Psychology

## 2014-02-28 DIAGNOSIS — F4320 Adjustment disorder, unspecified: Secondary | ICD-10-CM

## 2014-02-28 NOTE — Telephone Encounter (Signed)
Spoke with pts husband.  He states pt has appoint with Dr Felicity CoyerLeschber on 4.22.15

## 2014-03-01 ENCOUNTER — Ambulatory Visit: Payer: Medicare Other | Admitting: Physical Therapy

## 2014-03-02 ENCOUNTER — Ambulatory Visit (INDEPENDENT_AMBULATORY_CARE_PROVIDER_SITE_OTHER): Payer: Medicare Other | Admitting: Internal Medicine

## 2014-03-02 ENCOUNTER — Encounter: Payer: Self-pay | Admitting: Internal Medicine

## 2014-03-02 VITALS — BP 142/82 | HR 86 | Temp 98.4°F | Wt 177.1 lb

## 2014-03-02 DIAGNOSIS — G518 Other disorders of facial nerve: Secondary | ICD-10-CM

## 2014-03-02 DIAGNOSIS — G514 Facial myokymia: Secondary | ICD-10-CM

## 2014-03-02 DIAGNOSIS — J438 Other emphysema: Secondary | ICD-10-CM

## 2014-03-02 DIAGNOSIS — J439 Emphysema, unspecified: Secondary | ICD-10-CM

## 2014-03-02 MED ORDER — ALBUTEROL SULFATE HFA 108 (90 BASE) MCG/ACT IN AERS: 2.0000 | INHALATION_SPRAY | Freq: Four times a day (QID) | RESPIRATORY_TRACT | Status: DC | PRN

## 2014-03-02 NOTE — Progress Notes (Signed)
Pre visit review using our clinic review tool, if applicable. No additional management support is needed unless otherwise documented below in the visit note. 

## 2014-03-02 NOTE — Patient Instructions (Signed)
It was good to see you today.  We have reviewed your prior records including labs and tests today  Medications reviewed and updated - no changes Refill on medication(s) as discussed today.

## 2014-03-02 NOTE — Progress Notes (Signed)
Subjective:    Patient ID: Paula LarsenSusan R Nifong, female    DOB: 1927/03/12, 80 y.o.   MRN: [id number]  HPI  Patient is here for follow up  Reviewed chronic medical issues and interval medical events  Past Medical History  Diagnosis Date  . Depression   . Ejection fraction     EF 40%, echo, November, 2012  / in improved, EF 60%, echo, December, 2012  . Contrast media allergy     Patient feels poorly with contrast  . Status post AAA (abdominal aortic aneurysm) repair     Surgical repair, Dr. Hart RochesterLawson, December 27, 2011  . Pre-syncope     vasovagal w/ heart block  . Parotid mass     has refused further eval 10/2011  . GERD (gastroesophageal reflux disease)   . Anxiety   . Facial tic     L sided spasms - Botox trial summer 2013, ?effective  . Macular degeneration   . AAA (abdominal aortic aneurysm)     s/p repair 12/2011  . Congestive heart failure     NL EF 10/2011 echo   . Diabetes mellitus type II, controlled   . Hypothyroidism   . Sinus bradycardia 09/19/2011    Occurring simultaneously with complete heart block   . Hypertension   . COPD (chronic obstructive pulmonary disease)   . Facial spasm     L hemifacial-treated w/ botox xeomin -Dr Terrace ArabiaYan    Review of Systems  Constitutional: Positive for fatigue (chronic). Negative for fever.  Respiratory: Negative for cough and shortness of breath.   Cardiovascular: Negative for chest pain and leg swelling.  Neurological: Positive for facial asymmetry (L face spasm, intermittent). Negative for tremors, seizures, syncope and speech difficulty.       Objective:   Physical Exam  BP 142/82  Pulse 86  Temp(Src) 98.4 F (36.9 C) (Oral)  Wt 177 lb 1.9 oz (80.341 kg)  SpO2 91% Wt Readings from Last 3 Encounters:  03/02/14 177 lb 1.9 oz (80.341 kg)  02/24/14 179 lb 12.8 oz (81.557 kg)  02/23/14 181 lb (82.101 kg)   Constitutional: She appears well-developed and well-nourished. No distress. spouse at side Neck: Normal range of  motion. Neck supple. No JVD present. No thyromegaly present.  Cardiovascular: Normal rate, regular rhythm and normal heart sounds.  No murmur heard. No BLE edema. Pulmonary/Chest: Effort normal and breath sounds normal. No respiratory distress. She has no wheezes.  Psychiatric: She has a mildly anxious mood and affect. Her behavior is normal. Judgment and thought content normal.   Lab Results  Component Value Date   WBC 14.1* 01/31/2014   HGB 11.4* 01/31/2014   HCT 34.1* 01/31/2014   PLT 267.0 01/31/2014   GLUCOSE 126* 01/12/2014   CHOL 153 11/07/2013   TRIG 282* 11/07/2013   HDL 28* 11/07/2013   LDLDIRECT 90.6 02/10/2013   LDLCALC 69 11/07/2013   ALT 17 01/12/2014   AST 14 01/12/2014   NA 141 01/12/2014   K 3.7 02/23/2014   CL 105 01/12/2014   CREATININE 1.0 01/12/2014   BUN 19 01/12/2014   CO2 30 01/12/2014   TSH 0.25* 02/02/2014   INR 1.03 11/06/2013   HGBA1C 7.4* 02/02/2014    No results found.     Assessment & Plan:   Problem List Items Addressed This Visit   COPD (chronic obstructive pulmonary disease) with emphysema     Mild COPD, restrictive pattern on last PFT No acute flare -uses steroids as needed with improvement  Prefers albuterol MDI to Combivent, refill on Proair provided    Relevant Medications      ALBUTEROL SULFATE HFA 108 (90 BASE) MCG/ACT IN AERS   Facial twitching - Primary     eval and tx ongoing by neuro - but pt unsure if any benefit follwoing botox injections q4125mo -  hosp for "severe" spasm L face 10/2013 - no CVA or TIA, repeat OV and ER visits for same reviewed Advised follow up for repeat injection as needed (neuro feels psychogenic) Continue baclofen as ongoing - increased dose 10/24/13

## 2014-03-02 NOTE — Assessment & Plan Note (Signed)
eval and tx ongoing by neuro - but pt unsure if any benefit follwoing botox injections q8054mo -  hosp for "severe" spasm L face 10/2013 - no CVA or TIA, repeat OV and ER visits for same reviewed Advised follow up for repeat injection as needed (neuro feels psychogenic) Continue baclofen as ongoing - increased dose 10/24/13

## 2014-03-02 NOTE — Assessment & Plan Note (Signed)
Mild COPD, restrictive pattern on last PFT No acute flare -uses steroids as needed with improvement Prefers albuterol MDI to Combivent, refill on Proair provided

## 2014-03-09 ENCOUNTER — Other Ambulatory Visit (INDEPENDENT_AMBULATORY_CARE_PROVIDER_SITE_OTHER): Payer: Medicare Other

## 2014-03-09 ENCOUNTER — Encounter: Payer: Self-pay | Admitting: Neurology

## 2014-03-09 ENCOUNTER — Telehealth: Payer: Self-pay

## 2014-03-09 ENCOUNTER — Ambulatory Visit (INDEPENDENT_AMBULATORY_CARE_PROVIDER_SITE_OTHER): Payer: Medicare Other | Admitting: Internal Medicine

## 2014-03-09 ENCOUNTER — Ambulatory Visit (INDEPENDENT_AMBULATORY_CARE_PROVIDER_SITE_OTHER): Payer: Medicare Other | Admitting: Neurology

## 2014-03-09 ENCOUNTER — Encounter (INDEPENDENT_AMBULATORY_CARE_PROVIDER_SITE_OTHER): Payer: Self-pay

## 2014-03-09 ENCOUNTER — Encounter: Payer: Self-pay | Admitting: Internal Medicine

## 2014-03-09 VITALS — BP 120/88 | HR 81 | Temp 98.6°F | Wt 177.8 lb

## 2014-03-09 DIAGNOSIS — F0390 Unspecified dementia without behavioral disturbance: Secondary | ICD-10-CM

## 2014-03-09 DIAGNOSIS — F329 Major depressive disorder, single episode, unspecified: Secondary | ICD-10-CM

## 2014-03-09 DIAGNOSIS — R8290 Unspecified abnormal findings in urine: Secondary | ICD-10-CM | POA: Insufficient documentation

## 2014-03-09 DIAGNOSIS — R109 Unspecified abdominal pain: Secondary | ICD-10-CM

## 2014-03-09 DIAGNOSIS — F32A Depression, unspecified: Secondary | ICD-10-CM

## 2014-03-09 DIAGNOSIS — R82998 Other abnormal findings in urine: Secondary | ICD-10-CM

## 2014-03-09 DIAGNOSIS — G518 Other disorders of facial nerve: Secondary | ICD-10-CM

## 2014-03-09 DIAGNOSIS — G5139 Clonic hemifacial spasm, unspecified: Secondary | ICD-10-CM

## 2014-03-09 DIAGNOSIS — G514 Facial myokymia: Secondary | ICD-10-CM

## 2014-03-09 LAB — CBC WITH DIFFERENTIAL/PLATELET
BASOS ABS: 0 10*3/uL (ref 0.0–0.1)
Basophils Relative: 0.4 % (ref 0.0–3.0)
EOS ABS: 0.6 10*3/uL (ref 0.0–0.7)
Eosinophils Relative: 5.3 % — ABNORMAL HIGH (ref 0.0–5.0)
HEMATOCRIT: 35.8 % — AB (ref 36.0–46.0)
Hemoglobin: 12 g/dL (ref 12.0–15.0)
Lymphocytes Relative: 24.4 % (ref 12.0–46.0)
Lymphs Abs: 2.8 10*3/uL (ref 0.7–4.0)
MCHC: 33.6 g/dL (ref 30.0–36.0)
MCV: 101.7 fl — ABNORMAL HIGH (ref 78.0–100.0)
Monocytes Absolute: 0.7 10*3/uL (ref 0.1–1.0)
Monocytes Relative: 6.3 % (ref 3.0–12.0)
NEUTROS ABS: 7.2 10*3/uL (ref 1.4–7.7)
Neutrophils Relative %: 63.6 % (ref 43.0–77.0)
Platelets: 316 10*3/uL (ref 150.0–400.0)
RBC: 3.52 Mil/uL — ABNORMAL LOW (ref 3.87–5.11)
RDW: 15.7 % — AB (ref 11.5–14.6)
WBC: 11.3 10*3/uL — ABNORMAL HIGH (ref 4.5–10.5)

## 2014-03-09 LAB — URINALYSIS, ROUTINE W REFLEX MICROSCOPIC
Bilirubin Urine: NEGATIVE
KETONES UR: NEGATIVE
Nitrite: NEGATIVE
PH: 5.5 (ref 5.0–8.0)
Specific Gravity, Urine: 1.025 (ref 1.000–1.030)
Total Protein, Urine: NEGATIVE
URINE GLUCOSE: NEGATIVE
UROBILINOGEN UA: 0.2 (ref 0.0–1.0)

## 2014-03-09 LAB — HEPATIC FUNCTION PANEL
ALBUMIN: 4.3 g/dL (ref 3.5–5.2)
ALT: 19 U/L (ref 0–35)
AST: 20 U/L (ref 0–37)
Alkaline Phosphatase: 47 U/L (ref 39–117)
BILIRUBIN TOTAL: 0.6 mg/dL (ref 0.3–1.2)
Bilirubin, Direct: 0.1 mg/dL (ref 0.0–0.3)
TOTAL PROTEIN: 6.7 g/dL (ref 6.0–8.3)

## 2014-03-09 LAB — BASIC METABOLIC PANEL
BUN: 16 mg/dL (ref 6–23)
CO2: 28 meq/L (ref 19–32)
CREATININE: 1.1 mg/dL (ref 0.4–1.2)
Calcium: 10.1 mg/dL (ref 8.4–10.5)
Chloride: 108 mEq/L (ref 96–112)
GFR: 48.14 mL/min — ABNORMAL LOW (ref >60.00)
Glucose, Bld: 109 mg/dL — ABNORMAL HIGH (ref 70–99)
Potassium: 3.6 mEq/L (ref 3.5–5.1)
Sodium: 141 mEq/L (ref 135–145)

## 2014-03-09 LAB — LIPASE: Lipase: 37 U/L (ref 11.0–59.0)

## 2014-03-09 MED ORDER — INCOBOTULINUMTOXINA 50 UNITS IM SOLR
50.0000 [IU] | Freq: Once | INTRAMUSCULAR | Status: AC
  Administered 2014-03-09: 50 [IU] via INTRAMUSCULAR

## 2014-03-09 MED ORDER — NITROFURANTOIN MONOHYD MACRO 100 MG PO CAPS: 100.0000 mg | ORAL_CAPSULE | Freq: Two times a day (BID) | ORAL | Status: DC

## 2014-03-09 NOTE — Progress Notes (Signed)
Pre visit review using our clinic review tool, if applicable. No additional management support is needed unless otherwise documented below in the visit note. 

## 2014-03-09 NOTE — Patient Instructions (Signed)
Please take all new medication as prescribed  Please continue all other medications as before, and refills have been done if requested.  Please have the pharmacy call with any other refills you may need.   

## 2014-03-09 NOTE — Progress Notes (Signed)
Subjective:    Patient ID: Paula [name], female    DOB: 03/17/1927, 80 y.o.   MRN: [id number]  HPI  Here with acute visit, started feeling poorly about 3-4 days ago, had non prod cough yesterday where she had to call someone to help her home from getting her hair done; pt apparently mentioned abd pain when made her appt, but now states she doesn't really have significant abd pain,  I just "feel sick all the time."   Denies urinary symptoms such as dysuria, frequency, urgency, flank pain, hematuria or n/v, fever, chills.  Unable to give other hx due to dementia, seems to have marked ongoing anxiety and numerous somewhat illogical somatic complaints.  Frustrated that she cant seem to make herself understood well Past Medical History  Diagnosis Date  . Depression   . Ejection fraction     EF 40%, echo, November, 2012  / in improved, EF 60%, echo, December, 2012  . Contrast media allergy     Patient feels poorly with contrast  . Status post AAA (abdominal aortic aneurysm) repair     Surgical repair, Dr. Hart Rochester, December 27, 2011  . Pre-syncope     vasovagal w/ heart block  . Parotid mass     has refused further eval 10/2011  . GERD (gastroesophageal reflux disease)   . Anxiety   . Facial tic     L sided spasms - Botox trial summer 2013, ?effective  . Macular degeneration   . AAA (abdominal aortic aneurysm)     s/p repair 12/2011  . Congestive heart failure     NL EF 10/2011 echo   . Diabetes mellitus type II, controlled   . Hypothyroidism   . Sinus bradycardia 09/19/2011    Occurring simultaneously with complete heart block   . Hypertension   . COPD (chronic obstructive pulmonary disease)   . Facial spasm     L hemifacial-treated w/ botox xeomin -Dr Terrace Arabia   Past Surgical History  Procedure Laterality Date  . Cholecystectomy    . Knee cartilage surgery      left  . Sinus surgery with instatrak    . Appendectomy    . Oophorectomy      ovarian cyst  . Cataract extraction     bilateral  . Bladder repair    . US echocardiography  10/14/11  . Abdominal aortic aneurysm repair  12/27/2011    Procedure: ANEURYSM ABDOMINAL AORTIC REPAIR;  Surgeon: Josephina Gip, MD;  Location: [facility] OR;  Service: Vascular;  Laterality: N/A;  Resection and Grafting Abdominal Aortic Aneurysm , Aorta Bi Iliac.  Marland Kitchen Pacemaker insertion  11/12  . Cardiac catheterization  11/04/11    reports that she quit smoking about 2 years ago. Her smoking use included Cigarettes. She has a 30 pack-year smoking history. She has never used smokeless tobacco. She reports that she does not drink alcohol or use illicit drugs. family history includes Breast cancer in her sister; Cancer in her father and sister; Colon cancer in her sister; Esophageal cancer in her father; Heart disease in her mother, sister, and son. Allergies  Allergen Reactions  . Bactrim [Sulfamethoxazole-Tmp Ds]   . Contrast Media [Iodinated Diagnostic Agents] Anaphylaxis  . Gadolinium Derivatives   . Iohexol Anaphylaxis, Shortness Of Breath and Swelling     Desc: PT STATES SHE HAD A SEVERE REACTION TO IV CONRAST WITH THROAT SWELLING AND SOB. SHE WAS ADMITTED TO THE HOSPITAL. SHE HAS NEVER HAD CONTRAST AGAIN.   Marland Kitchen  Penicillins Rash  . Sulfa Antibiotics Anaphylaxis  . Avelox [Moxifloxacin Hcl In Nacl] Rash  . Ciprofloxacin Itching  . Delsym [Dextromethorphan] Other (See Comments)    Hallucination  . Hydromet [Hydrocodone-Homatropine] Other (See Comments)    Pt states med make her hallucinate   Current Outpatient Prescriptions on File Prior to Visit  Medication Sig Dispense Refill  . acetaminophen (TYLENOL) 500 MG tablet Take 500 mg by mouth every 6 (six) hours as needed (pain).      Marland Kitchen. albuterol (PROVENTIL HFA;VENTOLIN HFA) 108 (90 BASE) MCG/ACT inhaler Inhale 2 puffs into the lungs every 6 (six) hours as needed for wheezing or shortness of breath.  1 Inhaler  0  . albuterol-ipratropium (COMBIVENT) 18-103 MCG/ACT inhaler Inhale 2 puffs into the  lungs every 6 (six) hours as needed for wheezing or shortness of breath.      . ALPRAZolam (XANAX) 0.5 MG tablet Take 1 tablet (0.5 mg total) by mouth 3 (three) times daily.  90 tablet  5  . aspirin (ECOTRIN LOW STRENGTH) 81 MG EC tablet Take 1 tablet (81 mg total) by mouth daily. Swallow whole.  30 tablet  12  . baclofen (LIORESAL) 10 MG tablet Take 10 mg by mouth 3 (three) times daily.      . calcium carbonate (OS-CAL) 600 MG TABS Take 600 mg by mouth every other day.       . carvedilol (COREG) 6.25 MG tablet Take 6.25 mg by mouth 2 (two) times daily with a meal.      . cetirizine (ZYRTEC) 10 MG tablet Take 10 mg by mouth daily as needed for allergies.      . Cholecalciferol (VITAMIN D3) 5000 UNITS TABS Take 5,000 Units by mouth every other day.       . furosemide (LASIX) 20 MG tablet Take 20 mg by mouth.      . gabapentin (NEURONTIN) 100 MG capsule Take 100 mg by mouth at bedtime.      Marland Kitchen. guaiFENesin (MUCINEX) 600 MG 12 hr tablet Take 600 mg by mouth 2 (two) times daily.      Marland Kitchen. levothyroxine (SYNTHROID, LEVOTHROID) 175 MCG tablet Take 1 tablet (175 mcg total) by mouth daily before breakfast. 02/03/14:1 daily EXCEPT 1/2 on Tues & Thurs; check TSH 10 weeks      . losartan-hydrochlorothiazide (HYZAAR) 50-12.5 MG per tablet Take 1 tablet by mouth  daily  90 tablet  3  . metFORMIN (GLUCOPHAGE-XR) 500 MG 24 hr tablet Take 250 mg by mouth at bedtime.      . mometasone (NASONEX) 50 MCG/ACT nasal spray Place 2 sprays into the nose daily as needed (allergies).      . ondansetron (ZOFRAN) 4 MG tablet Take 4 mg by mouth every 8 (eight) hours as needed for nausea or vomiting.      . pantoprazole (PROTONIX) 40 MG tablet Take 40 mg by mouth daily.      Bertram Gala. Polyethyl Glycol-Propyl Glycol (SYSTANE) 0.4-0.3 % SOLN Apply 1 drop to eye 2 (two) times daily as needed (dry eyes).      . potassium chloride SA (K-DUR,KLOR-CON) 20 MEQ tablet Take 1 tablet by mouth  daily  90 tablet  3  . sertraline (ZOLOFT) 50 MG tablet  Take 1 tablet (50 mg total) by mouth daily. 1/2 qd      . sucralfate (CARAFATE) 1 GM/10ML suspension 10 cc (2 tsp)  tid ac  210 mL  0   No current facility-administered medications on file prior to visit.  Review of Systems All otherwise neg per pt     Objective:   Physical Exam BP 120/88  Pulse 81  Temp(Src) 98.6 F (37 C) (Oral)  Wt 177 lb 12 oz (80.627 kg)  SpO2 95% VS noted,  Constitutional: Pt appears well-developed, well-nourished.  HENT: Head: NCAT.  Right Ear: External ear normal.  Left Ear: External ear normal.  Eyes: . Pupils are equal, round, and reactive to light. Conjunctivae and EOM are normal Neck: Normal range of motion. Neck supple.  Cardiovascular: Normal rate and regular rhythm.   Pulmonary/Chest: Effort normal and breath sounds normal.  Abd:  Soft, NT, ND, + BS, no flank tender Neurological: Pt is alert. Not confused , motor grossly intact Skin: Skin is warm. No rash Psychiatric: Pt behavior is normal. No agitation. but mod nervous    Assessment & Plan:

## 2014-03-09 NOTE — Progress Notes (Signed)
History of Present Illness   [name] is a a 80 years old right-handed Caucasian female, referred by Dr. Vickey Hugerohmeier for the evaluation of EMG guided bottulinumtoxin injection for left hemifacial spasm, she is accompanied by her husband.  She has past medical history of diet-controlled diabetes, coronary artery disease, husband reported 3 episode of sudden loss of consciousness from October to December 2012, "telemetry went flat", she also underwent aortic aneurysm repair  in December 27, 2011,She reported 6 years history of gradual onset left facial spasm, she denies history of left Bell's palsy, initial spasm involving left eye, now spreading to involving her whole left face, bothersome to her, she actually presented to the emergency room twice recently, because severe left facial twitching, to the point of causing anxiety, " I was so scared" She denied left facial sensory change, she has poor vision due to macular degeneration bilaterally, reported acute worsening of right vision since most recent aortic aneurysm repair,She also reports few weeks history of acute worsening gait difficulty, stuttering of her speech,I reviewed most recent MRI of brain in December 2012, there was mild atrophy, extensive periventricular white matter disease, MRA of the brain showed no large vessel disease.  I performed EMG guided xeomin injection into her left face in  07/14/2012, 22.5 unit, she responded very well, there was no significant side effect, because she was doing so well, I do not perform scheduled injection in February 2014,   She came in earlier than expected today, complains of coughing, mild fever, not feeling well, was just evaluated by her primary care physician, does not want to have injection today she denied recurrence of her left facial spasm.  She was reevaluated by Dr. Vickey Hugerohmeier in April 28th 2014.  UPDATE April 29th 2015 She has received EMG guided Xeomin injection in Jan 2015, responded well, I do  not see much twitching of her left face today. She prefer to have continued injection.  Physical Exam   General: well developed, well  nourished, seated,anxious-looking, intermittent wet cough  Head: head  normocephalic and atraumatic.  Oropharynx benign. Ears, Nose and Throat: mallojmpati2  Neck: supple, no carotid bruits  Respiratory: clear to auscultation bilaterally Cardiovascular: regular rate rhythm Skin: no edema   Neurologic Exam  Mental Status: elderly caucasain female, mild slow speech, MMSE 21/27 today, she is not oriented to year, date, has difficulty spell words backwards, could not write a sentence or copy design because of poor vision Cranial Nerves: PERL. EOM intact without nystamus. Visual fields full. Hearing intack.  She has occasional left facial twitching involving her left and right lower eyelid. There was no significant asymmetry of her face. Motor:  no significant weakness in the extremities   Sensory: Normal to light touch, pinprick, and vibratory in all extremities.   Coordination:  There was no dysmetria noticed. Gait and Station: push up from seated position, cautious,  mild unsteady, dragging right leg, Enblock turning,  No assistive device Reflexes: Deep tendon reflexes:2+.  Plantar responses are flexor.   Assessment and Plan: 80 year old with long-standing left hemifacial spasm since 2011, since her oral surgery. She has significant side effects from CBZ and stopped. Taking Xanax 4 times daily with reductionin spasms. Recent  AAA surgery in Feb 2013.   EMG guided Xeomin injection for left facial spasm.  50 units of xeomin/ 1 cc of NS,  Used 27.5 units, discard 22.5 units  Injection was placed at left orbicularis oculi at 2, 3, 4, 6, 8,clock, (2.5 units  each x 5 = 12.5 units total),  Left corrugate  5 Right corrugate 5 units.  Right orbicularis oculi at 6, 9 clock=5 units.  She tolerated the injection well, will return to clinic in 3 months or longer  for repeat injection.

## 2014-03-09 NOTE — Telephone Encounter (Signed)
Labs entered for appointment today 5:15.  Called the patient to inform to come in before 5 to do labs and urine test before seeing MD.  Left a message to call back

## 2014-03-09 NOTE — Assessment & Plan Note (Signed)
Has vauge hx, pt with dementia but UA suggestive of UTI, wbc also mild elevated , has numerous med alleriges but will try macrobid bid - done erx, pt to f/u with PCP as needed

## 2014-03-09 NOTE — Telephone Encounter (Signed)
Called informed the husband to come in a little early to get labs and urine checked.

## 2014-03-11 ENCOUNTER — Emergency Department (HOSPITAL_COMMUNITY)
Admission: EM | Admit: 2014-03-11 | Discharge: 2014-03-12 | Disposition: A | Discharge status: Home / Self Care | Payer: Medicare Other | Attending: Emergency Medicine | Admitting: Emergency Medicine

## 2014-03-11 ENCOUNTER — Other Ambulatory Visit: Payer: Self-pay | Admitting: Internal Medicine

## 2014-03-11 ENCOUNTER — Emergency Department (HOSPITAL_COMMUNITY): Payer: Medicare Other

## 2014-03-11 ENCOUNTER — Encounter (HOSPITAL_COMMUNITY): Payer: Self-pay | Admitting: Emergency Medicine

## 2014-03-11 DIAGNOSIS — I10 Essential (primary) hypertension: Secondary | ICD-10-CM | POA: Insufficient documentation

## 2014-03-11 DIAGNOSIS — E039 Hypothyroidism, unspecified: Secondary | ICD-10-CM | POA: Insufficient documentation

## 2014-03-11 DIAGNOSIS — F411 Generalized anxiety disorder: Secondary | ICD-10-CM | POA: Insufficient documentation

## 2014-03-11 DIAGNOSIS — J449 Chronic obstructive pulmonary disease, unspecified: Secondary | ICD-10-CM | POA: Insufficient documentation

## 2014-03-11 DIAGNOSIS — N390 Urinary tract infection, site not specified: Secondary | ICD-10-CM | POA: Insufficient documentation

## 2014-03-11 DIAGNOSIS — Z79899 Other long term (current) drug therapy: Secondary | ICD-10-CM | POA: Insufficient documentation

## 2014-03-11 DIAGNOSIS — I509 Heart failure, unspecified: Secondary | ICD-10-CM | POA: Insufficient documentation

## 2014-03-11 DIAGNOSIS — Z7982 Long term (current) use of aspirin: Secondary | ICD-10-CM | POA: Insufficient documentation

## 2014-03-11 DIAGNOSIS — F329 Major depressive disorder, single episode, unspecified: Secondary | ICD-10-CM | POA: Insufficient documentation

## 2014-03-11 DIAGNOSIS — Z950 Presence of cardiac pacemaker: Secondary | ICD-10-CM | POA: Insufficient documentation

## 2014-03-11 DIAGNOSIS — Z792 Long term (current) use of antibiotics: Secondary | ICD-10-CM | POA: Insufficient documentation

## 2014-03-11 DIAGNOSIS — K219 Gastro-esophageal reflux disease without esophagitis: Secondary | ICD-10-CM | POA: Insufficient documentation

## 2014-03-11 DIAGNOSIS — F3289 Other specified depressive episodes: Secondary | ICD-10-CM | POA: Insufficient documentation

## 2014-03-11 DIAGNOSIS — J4489 Other specified chronic obstructive pulmonary disease: Secondary | ICD-10-CM | POA: Insufficient documentation

## 2014-03-11 DIAGNOSIS — Z8669 Personal history of other diseases of the nervous system and sense organs: Secondary | ICD-10-CM | POA: Insufficient documentation

## 2014-03-11 DIAGNOSIS — Z9889 Other specified postprocedural states: Secondary | ICD-10-CM | POA: Insufficient documentation

## 2014-03-11 DIAGNOSIS — IMO0002 Reserved for concepts with insufficient information to code with codable children: Secondary | ICD-10-CM | POA: Insufficient documentation

## 2014-03-11 DIAGNOSIS — E119 Type 2 diabetes mellitus without complications: Secondary | ICD-10-CM | POA: Insufficient documentation

## 2014-03-11 DIAGNOSIS — Z880 Allergy status to penicillin: Secondary | ICD-10-CM | POA: Insufficient documentation

## 2014-03-11 DIAGNOSIS — Z87891 Personal history of nicotine dependence: Secondary | ICD-10-CM | POA: Insufficient documentation

## 2014-03-11 LAB — CBC WITH DIFFERENTIAL/PLATELET
BASOS ABS: 0.1 10*3/uL (ref 0.0–0.1)
Basophils Relative: 0 % (ref 0–1)
EOS PCT: 7 % — AB (ref 0–5)
Eosinophils Absolute: 0.8 10*3/uL — ABNORMAL HIGH (ref 0.0–0.7)
HCT: 32.9 % — ABNORMAL LOW (ref 36.0–46.0)
Hemoglobin: 10.7 g/dL — ABNORMAL LOW (ref 12.0–15.0)
LYMPHS ABS: 2.1 10*3/uL (ref 0.7–4.0)
Lymphocytes Relative: 18 % (ref 12–46)
MCH: 33.6 pg (ref 26.0–34.0)
MCHC: 32.5 g/dL (ref 30.0–36.0)
MCV: 103.5 fL — ABNORMAL HIGH (ref 78.0–100.0)
Monocytes Absolute: 1 10*3/uL (ref 0.1–1.0)
Monocytes Relative: 9 % (ref 3–12)
NEUTROS ABS: 7.7 10*3/uL (ref 1.7–7.7)
NEUTROS PCT: 67 % (ref 43–77)
PLATELETS: 247 10*3/uL (ref 150–400)
RBC: 3.18 MIL/uL — ABNORMAL LOW (ref 3.87–5.11)
RDW: 14.6 % (ref 11.5–15.5)
WBC: 11.6 10*3/uL — ABNORMAL HIGH (ref 4.0–10.5)

## 2014-03-11 LAB — URINALYSIS, ROUTINE W REFLEX MICROSCOPIC
GLUCOSE, UA: NEGATIVE mg/dL
Hgb urine dipstick: NEGATIVE
Ketones, ur: NEGATIVE mg/dL
Nitrite: NEGATIVE
Protein, ur: 30 mg/dL — AB
SPECIFIC GRAVITY, URINE: 1.039 — AB (ref 1.005–1.030)
Urobilinogen, UA: 1 mg/dL (ref 0.0–1.0)
pH: 5.5 (ref 5.0–8.0)

## 2014-03-11 LAB — URINE MICROSCOPIC-ADD ON

## 2014-03-11 LAB — BASIC METABOLIC PANEL
BUN: 14 mg/dL (ref 6–23)
CALCIUM: 9.4 mg/dL (ref 8.4–10.5)
CHLORIDE: 105 meq/L (ref 96–112)
CO2: 28 meq/L (ref 19–32)
Creatinine, Ser: 1.01 mg/dL (ref 0.50–1.10)
GFR calc Af Amer: 57 mL/min — ABNORMAL LOW (ref >90)
GFR calc non Af Amer: 49 mL/min — ABNORMAL LOW (ref >90)
Glucose, Bld: 118 mg/dL — ABNORMAL HIGH (ref 70–99)
POTASSIUM: 3.7 meq/L (ref 3.7–5.3)
SODIUM: 143 meq/L (ref 137–147)

## 2014-03-11 LAB — I-STAT TROPONIN, ED: Troponin i, poc: 0 ng/mL (ref 0.00–0.08)

## 2014-03-11 NOTE — ED Notes (Addendum)
Pt was seen at [facility] for UTI on 4/29. Pt states she feels she has "gotten sicker" and would like to be evaluated. Pt feels nauseated and is concerned that she has not urinated as much. Pt denies vomiting/diarrhea. Pt is in NAD.

## 2014-03-11 NOTE — ED Notes (Signed)
EKG given to EDP, Steinl,J.MD., for review.

## 2014-03-11 NOTE — ED Provider Notes (Signed)
CSN: [id number]     Arrival date & time 03/11/14  2209 History   First MD Initiated Contact with Patient 03/11/14 2231     Chief complaint: I don't feel good  HPI Pt has not been feeling well for the last several days.  The patient cannot be more specific other than saying she just doesn't feel good. She has not had any chest pain. She has not been coughing. She has not had vomiting or diarrhea. She has not had any fevers. She has not had syncope. She does not have a headache. She does not have a sore throat. She has had some intermittent coughing. Patient went and saw her primary doctor on April 29. She was told she had a urinary tract infection. Patient has been taking Macrobid. Patient came into the emergency room today because she was not feeling better and felt worse. Past Medical History  Diagnosis Date  . Depression   . Ejection fraction     EF 40%, echo, November, 2012  / in improved, EF 60%, echo, December, 2012  . Contrast media allergy     Patient feels poorly with contrast  . Status post AAA (abdominal aortic aneurysm) repair     Surgical repair, Dr. Hart Rochester, December 27, 2011  . Pre-syncope     vasovagal w/ heart block  . Parotid mass     has refused further eval 10/2011  . GERD (gastroesophageal reflux disease)   . Anxiety   . Facial tic     L sided spasms - Botox trial summer 2013, ?effective  . Macular degeneration   . AAA (abdominal aortic aneurysm)     s/p repair 12/2011  . Congestive heart failure     NL EF 10/2011 echo   . Diabetes mellitus type II, controlled   . Hypothyroidism   . Sinus bradycardia 09/19/2011    Occurring simultaneously with complete heart block   . Hypertension   . COPD (chronic obstructive pulmonary disease)   . Facial spasm     L hemifacial-treated w/ botox xeomin -Dr Terrace Arabia   Past Surgical History  Procedure Laterality Date  . Cholecystectomy    . Knee cartilage surgery      left  . Sinus surgery with instatrak    . Appendectomy    .  Oophorectomy      ovarian cyst  . Cataract extraction      bilateral  . Bladder repair    . US echocardiography  10/14/11  . Abdominal aortic aneurysm repair  12/27/2011    Procedure: ANEURYSM ABDOMINAL AORTIC REPAIR;  Surgeon: Josephina Gip, MD;  Location: [facility] OR;  Service: Vascular;  Laterality: N/A;  Resection and Grafting Abdominal Aortic Aneurysm , Aorta Bi Iliac.  Marland Kitchen Pacemaker insertion  11/12  . Cardiac catheterization  11/04/11   Family History  Problem Relation Age of Onset  . Esophageal cancer Father   . Cancer Father   . Breast cancer Sister   . Cancer Sister   . Colon cancer Sister   . Heart disease Sister   . Heart disease Mother   . Heart disease Son    History  Substance Use Topics  . Smoking status: Former Smoker -- 1.00 packs/day for 30 years    Types: Cigarettes    Quit date: 08/31/2011  . Smokeless tobacco: Never Used  . Alcohol Use: No   OB History   Grav Para Term Preterm Abortions TAB SAB Ect Mult Living  Review of Systems  All other systems reviewed and are negative.     Allergies  Bactrim; Contrast media; Gadolinium derivatives; Iohexol; Penicillins; Sulfa antibiotics; Avelox; Ciprofloxacin; Delsym; and Hydromet  Home Medications   Prior to Admission medications   Medication Sig Start Date End Date Taking? Authorizing Provider  acetaminophen (TYLENOL) 500 MG tablet Take 500 mg by mouth every 6 (six) hours as needed (pain).    Historical Provider, MD  albuterol (PROVENTIL HFA;VENTOLIN HFA) 108 (90 BASE) MCG/ACT inhaler Inhale 2 puffs into the lungs every 6 (six) hours as needed for wheezing or shortness of breath. 03/02/14   Newt Lukes, MD  albuterol-ipratropium (COMBIVENT) 18-103 MCG/ACT inhaler Inhale 2 puffs into the lungs every 6 (six) hours as needed for wheezing or shortness of breath.    Historical Provider, MD  ALPRAZolam Prudy Feeler) 0.5 MG tablet Take 1 tablet (0.5 mg total) by mouth 3 (three) times daily. 12/17/13    Newt Lukes, MD  aspirin (ECOTRIN LOW STRENGTH) 81 MG EC tablet Take 1 tablet (81 mg total) by mouth daily. Swallow whole. 02/02/14   Pecola Lawless, MD  baclofen (LIORESAL) 10 MG tablet Take 10 mg by mouth 3 (three) times daily.    Historical Provider, MD  calcium carbonate (OS-CAL) 600 MG TABS Take 600 mg by mouth every other day.     Historical Provider, MD  carvedilol (COREG) 6.25 MG tablet Take 6.25 mg by mouth 2 (two) times daily with a meal.    Historical Provider, MD  cetirizine (ZYRTEC) 10 MG tablet Take 10 mg by mouth daily as needed for allergies.    Historical Provider, MD  Cholecalciferol (VITAMIN D3) 5000 UNITS TABS Take 5,000 Units by mouth every other day.     Historical Provider, MD  furosemide (LASIX) 20 MG tablet Take 20 mg by mouth.    Historical Provider, MD  gabapentin (NEURONTIN) 100 MG capsule Take 100 mg by mouth at bedtime. 11/29/13   Newt Lukes, MD  guaiFENesin (MUCINEX) 600 MG 12 hr tablet Take 600 mg by mouth 2 (two) times daily.    Historical Provider, MD  levothyroxine (SYNTHROID, LEVOTHROID) 175 MCG tablet Take 1 tablet (175 mcg total) by mouth daily before breakfast. 02/03/14:1 daily EXCEPT 1/2 on Tues & Thurs; check TSH 10 weeks 02/03/14   Pecola Lawless, MD  losartan-hydrochlorothiazide [facility]) 50-12.5 MG per tablet Take 1 tablet by mouth  daily 02/01/14   Newt Lukes, MD  metFORMIN (GLUCOPHAGE-XR) 500 MG 24 hr tablet Take 250 mg by mouth at bedtime. 12/08/13   Newt Lukes, MD  mometasone (NASONEX) 50 MCG/ACT nasal spray Place 2 sprays into the nose daily as needed (allergies).    Historical Provider, MD  nitrofurantoin, macrocrystal-monohydrate, (MACROBID) 100 MG capsule Take 1 capsule (100 mg total) by mouth 2 (two) times daily. 03/09/14   Corwin Levins, MD  ondansetron (ZOFRAN) 4 MG tablet Take 4 mg by mouth every 8 (eight) hours as needed for nausea or vomiting.    Historical Provider, MD  pantoprazole (PROTONIX) 40 MG tablet Take 40 mg  by mouth daily.    Historical Provider, MD  Polyethyl Glycol-Propyl Glycol (SYSTANE) 0.4-0.3 % SOLN Apply 1 drop to eye 2 (two) times daily as needed (dry eyes).    Historical Provider, MD  potassium chloride SA (K-DUR,KLOR-CON) 20 MEQ tablet Take 1 tablet by mouth  daily    Newt Lukes, MD  sertraline (ZOLOFT) 50 MG tablet Take 1 tablet (50 mg total)  by mouth daily. 1/2 qd 02/02/14   Pecola LawlessWilliam F Hopper, MD  sucralfate (CARAFATE) 1 GM/10ML suspension 10 cc (2 tsp)  tid ac 02/24/14   Pecola LawlessWilliam F Hopper, MD   BP 137/56  Pulse 72  Temp(Src) 98.3 F (36.8 C) (Oral)  Resp 16  SpO2 92% Physical Exam  Nursing note and vitals reviewed. Constitutional: No distress.  HENT:  Head: Normocephalic and atraumatic.  Right Ear: External ear normal.  Left Ear: External ear normal.  Eyes: Conjunctivae are normal. Right eye exhibits no discharge. Left eye exhibits no discharge. No scleral icterus.  Neck: Neck supple. No tracheal deviation present.  Cardiovascular: Normal rate, regular rhythm and intact distal pulses.   Pulmonary/Chest: Effort normal and breath sounds normal. No stridor. No respiratory distress. She has no wheezes. She has no rales.  Abdominal: Soft. Bowel sounds are normal. She exhibits no distension. There is no tenderness. There is no rebound and no guarding.  Musculoskeletal: She exhibits no edema and no tenderness.  Neurological: She is alert. She has normal strength. No cranial nerve deficit (no facial droop, extraocular movements intact, no slurred speech) or sensory deficit. She exhibits normal muscle tone. She displays no seizure activity. Coordination normal.  Skin: Skin is warm and dry. No rash noted. She is not diaphoretic.  Psychiatric: She has a normal mood and affect.    ED Course  Procedures (including critical care time) Labs Review Labs Reviewed  CBC WITH DIFFERENTIAL - Abnormal; Notable for the following:    WBC 11.6 (*)    RBC 3.18 (*)    Hemoglobin 10.7 (*)     HCT 32.9 (*)    MCV 103.5 (*)    Eosinophils Relative 7 (*)    Eosinophils Absolute 0.8 (*)    All other components within normal limits  BASIC METABOLIC PANEL - Abnormal; Notable for the following:    Glucose, Bld 118 (*)    GFR calc non Af Amer 49 (*)    GFR calc Af Amer 57 (*)    All other components within normal limits  URINALYSIS, ROUTINE W REFLEX MICROSCOPIC - Abnormal; Notable for the following:    Color, Urine ORANGE (*)    APPearance CLOUDY (*)    Specific Gravity, Urine 1.039 (*)    Bilirubin Urine SMALL (*)    Protein, ur 30 (*)    Leukocytes, UA MODERATE (*)    All other components within normal limits  URINE MICROSCOPIC-ADD ON - Abnormal; Notable for the following:    Squamous Epithelial / LPF FEW (*)    All other components within normal limits  URINE CULTURE  I-STAT TROPOININ, ED    Imaging Review Dg Chest 2 View  03/12/2014   CLINICAL DATA:  Weakness.  Loss of appetite.  Nausea.  The legs.  EXAM: CHEST  2 VIEW  COMPARISON:  DG CHEST 2 VIEW dated 01/03/2014  FINDINGS: Cardiac enlargement without vascular congestion. Focal increased density in the left mid and upper lung may represent early pneumonia. No blunting of costophrenic angles. No pneumothorax.  IMPRESSION: Cardiac enlargement. Suggestion of early infiltration in the left mid and upper lung.   Electronically Signed   By: Burman NievesWilliam  Stevens M.D.   On: 03/12/2014 00:01     EKG Interpretation   Date/Time:  Friday Mar 11 2014 23:10:38 EDT Ventricular Rate:  69 PR Interval:  230 QRS Duration: 91 QT Interval:  456 QTC Calculation: 489 R Axis:   45 Text Interpretation:  Sinus rhythm Prolonged PR interval  Borderline  prolonged QT interval No significant change since last tracing Confirmed  by Debbera Wolken  MD-J, Semaja Lymon ([id number]) on 03/12/2014 12:03:55 AM      MDM   Final diagnoses:  UTI (urinary tract infection)    Pt has persistent uti.  No culture report available from the recent office visit.   Will give patient a  dose of rocephin.  Has a pcn allergy (rash) but doubt that patient will have a problem with a third generation cephalosporin.  Will dc home on oral abx.  Follow up with pvp  Has borderline low o2 sat.  Pt has been told in the past she should be on oxygen.    CXR reviewed.  Not clearly a pneumonia.  Already with a urinary source.  Cephalosporin abx should have respiratory coverage.   Celene KrasJon R Jhalil Silvera, MD 03/12/14 [phone]

## 2014-03-11 NOTE — ED Notes (Signed)
Pt. On monitor. 

## 2014-03-12 MED ORDER — CEFUROXIME AXETIL 250 MG PO TABS: 250.0000 mg | ORAL_TABLET | Freq: Two times a day (BID) | ORAL | Status: DC

## 2014-03-12 MED ORDER — DEXTROSE 5 % IV SOLN
1.0000 g | INTRAVENOUS | Status: DC
  Administered 2014-03-12: 1 g via INTRAVENOUS
  Filled 2014-03-12: qty 10

## 2014-03-12 NOTE — Discharge Instructions (Signed)
Urinary Tract Infection °A urinary tract infection (UTI) can occur any place along the urinary tract. The tract includes the kidneys, ureters, bladder, and urethra. A type of germ called bacteria often causes a UTI. UTIs are often helped with antibiotic medicine.  °HOME CARE  °· If given, take antibiotics as told by your doctor. Finish them even if you start to feel better. °· Drink enough fluids to keep your pee (urine) clear or pale yellow. °· Avoid tea, drinks with caffeine, and bubbly (carbonated) drinks. °· Pee often. Avoid holding your pee in for a long time. °· Pee before and after having sex (intercourse). °· Wipe from front to back after you poop (bowel movement) if you are a woman. Use each tissue only once. °GET HELP RIGHT AWAY IF:  °· You have back pain. °· You have lower belly (abdominal) pain. °· You have chills. °· You feel sick to your stomach (nauseous). °· You throw up (vomit). °· Your burning or discomfort with peeing does not go away. °· You have a fever. °· Your symptoms are not better in 3 days. °MAKE SURE YOU:  °· Understand these instructions. °· Will watch your condition. °· Will get help right away if you are not doing well or get worse. °Document Released: 04/15/2008 Document Revised: 07/22/2012 Document Reviewed: 05/28/2012 °Exit[employer]® Patient Information ©2014 [employer]. ° °

## 2014-03-14 ENCOUNTER — Telehealth: Payer: Self-pay | Admitting: *Deleted

## 2014-03-14 DIAGNOSIS — R0600 Dyspnea, unspecified: Secondary | ICD-10-CM

## 2014-03-14 LAB — URINE CULTURE
Colony Count: NO GROWTH
Culture: NO GROWTH

## 2014-03-14 NOTE — Telephone Encounter (Signed)
pts husband called states Dr Alwyn RenHopper suggested pt be on 24 hour O2.  Pt is requesting a referral be sent for set up.  Please advise

## 2014-03-15 NOTE — Telephone Encounter (Signed)
Spoke with pts husband advised of MDs message

## 2014-03-15 NOTE — Telephone Encounter (Signed)
i am not certain pt will qualify for this i will make appt w/ pulm to review and they can arrange if needed (if meets criteria)

## 2014-03-21 ENCOUNTER — Ambulatory Visit: Payer: Medicare Other | Attending: Internal Medicine | Admitting: Physical Therapy

## 2014-03-21 DIAGNOSIS — M6281 Muscle weakness (generalized): Secondary | ICD-10-CM | POA: Insufficient documentation

## 2014-03-21 DIAGNOSIS — R5381 Other malaise: Secondary | ICD-10-CM | POA: Insufficient documentation

## 2014-03-21 DIAGNOSIS — IMO0001 Reserved for inherently not codable concepts without codable children: Secondary | ICD-10-CM | POA: Insufficient documentation

## 2014-03-21 DIAGNOSIS — R269 Unspecified abnormalities of gait and mobility: Secondary | ICD-10-CM | POA: Diagnosis not present

## 2014-03-22 ENCOUNTER — Ambulatory Visit (INDEPENDENT_AMBULATORY_CARE_PROVIDER_SITE_OTHER): Payer: Medicare Other | Admitting: Internal Medicine

## 2014-03-22 ENCOUNTER — Encounter: Payer: Self-pay | Admitting: Internal Medicine

## 2014-03-22 ENCOUNTER — Other Ambulatory Visit (INDEPENDENT_AMBULATORY_CARE_PROVIDER_SITE_OTHER): Payer: Medicare Other

## 2014-03-22 ENCOUNTER — Ambulatory Visit (INDEPENDENT_AMBULATORY_CARE_PROVIDER_SITE_OTHER)
Admission: RE | Admit: 2014-03-22 | Discharge: 2014-03-22 | Disposition: A | Discharge status: Home / Self Care | Payer: Medicare Other | Source: Ambulatory Visit | Attending: Internal Medicine | Admitting: Internal Medicine

## 2014-03-22 VITALS — BP 124/72 | HR 75 | Temp 98.7°F | Ht 65.0 in | Wt 177.0 lb

## 2014-03-22 DIAGNOSIS — R0989 Other specified symptoms and signs involving the circulatory and respiratory systems: Secondary | ICD-10-CM

## 2014-03-22 DIAGNOSIS — R0600 Dyspnea, unspecified: Secondary | ICD-10-CM

## 2014-03-22 DIAGNOSIS — J438 Other emphysema: Secondary | ICD-10-CM

## 2014-03-22 DIAGNOSIS — R0609 Other forms of dyspnea: Secondary | ICD-10-CM

## 2014-03-22 DIAGNOSIS — J439 Emphysema, unspecified: Secondary | ICD-10-CM

## 2014-03-22 DIAGNOSIS — F17200 Nicotine dependence, unspecified, uncomplicated: Secondary | ICD-10-CM

## 2014-03-22 LAB — CBC WITH DIFFERENTIAL/PLATELET
Basophils Absolute: 0 10*3/uL (ref 0.0–0.1)
Basophils Relative: 0.4 % (ref 0.0–3.0)
EOS PCT: 4.9 % (ref 0.0–5.0)
Eosinophils Absolute: 0.5 10*3/uL (ref 0.0–0.7)
HCT: 34.5 % — ABNORMAL LOW (ref 36.0–46.0)
Hemoglobin: 11.5 g/dL — ABNORMAL LOW (ref 12.0–15.0)
Lymphocytes Relative: 25.1 % (ref 12.0–46.0)
Lymphs Abs: 2.6 10*3/uL (ref 0.7–4.0)
MCHC: 33.4 g/dL (ref 30.0–36.0)
MCV: 101.3 fl — ABNORMAL HIGH (ref 78.0–100.0)
MONOS PCT: 5.3 % (ref 3.0–12.0)
Monocytes Absolute: 0.6 10*3/uL (ref 0.1–1.0)
NEUTROS PCT: 64.3 % (ref 43.0–77.0)
Neutro Abs: 6.6 10*3/uL (ref 1.4–7.7)
Platelets: 294 10*3/uL (ref 150.0–400.0)
RBC: 3.41 Mil/uL — AB (ref 3.87–5.11)
RDW: 15.9 % — ABNORMAL HIGH (ref 11.5–15.5)
WBC: 10.3 10*3/uL (ref 4.0–10.5)

## 2014-03-22 LAB — BRAIN NATRIURETIC PEPTIDE: PRO B NATRI PEPTIDE: 183 pg/mL — AB (ref 0.0–100.0)

## 2014-03-22 LAB — BASIC METABOLIC PANEL
BUN: 16 mg/dL (ref 6–23)
CALCIUM: 10.1 mg/dL (ref 8.4–10.5)
CHLORIDE: 106 meq/L (ref 96–112)
CO2: 28 mEq/L (ref 19–32)
CREATININE: 0.9 mg/dL (ref 0.4–1.2)
GFR: 62.43 mL/min (ref >60.00)
Glucose, Bld: 118 mg/dL — ABNORMAL HIGH (ref 70–99)
Potassium: 4 mEq/L (ref 3.5–5.1)
Sodium: 142 mEq/L (ref 135–145)

## 2014-03-22 LAB — TSH: TSH: 1.51 u[IU]/mL (ref 0.35–4.50)

## 2014-03-22 NOTE — Progress Notes (Signed)
   Subjective:    Patient ID: Paula LarsenSusan R [name], female    DOB: January 06, 1927    MRN: [id number]  HPI  [age] yowf quit smoking 2012 prev eval by Dr Vassie LollAlva for emphysmatous changes on cxr but FEV1 70% predicted in 03/2013  and referred for consultation by Dr Bayard HuggerLeshber for sob/ ? Now desating   03/22/2014 f/u ov/Rileigh Kawashima re:  Chief Complaint  Patient presents with  . Pulmonary Consult    Referred per Dr. Felicity CoyerLeschber for eval of dyspnea. Pt reports that she "can't tell if I am short of breath".     min am cough > mucoid, no better with inhalers  No obvious  day to day or daytime variabilty or assoc   cp or chest tightness, subjective wheeze overt sinus or hb symptoms. No unusual exp hx or h/o childhood pna/ asthma or knowledge of premature birth.  Sleeping ok without nocturnal  or early am exacerbation  of respiratory  c/o's or need for noct saba. Also denies any obvious fluctuation of symptoms with weather or environmental changes or other aggravating or alleviating factors except as outlined above   Current Medications, Allergies, Complete Past Medical History, Past Surgical History, Family History, and Social History were reviewed in [facility] electronic medical record.            Review of Systems  Constitutional: Negative for fever, chills and unexpected weight change.  HENT: Negative for congestion, dental problem, ear pain, nosebleeds, postnasal drip, rhinorrhea, sinus pressure, sneezing, sore throat, trouble swallowing and voice change.   Eyes: Negative for visual disturbance.  Respiratory: Positive for cough. Negative for choking and shortness of breath.   Cardiovascular: Negative for chest pain and leg swelling.  Gastrointestinal: Negative for vomiting, abdominal pain and diarrhea.  Genitourinary: Negative for difficulty urinating.  Musculoskeletal: Negative for arthralgias.  Skin: Negative for rash.  Neurological: Negative for tremors, syncope and headaches.  Hematological: Does not  bruise/bleed easily.       Objective:   Physical Exam  amb wf doesn't really know why she's here  Wt Readings from Last 3 Encounters:  03/22/14 177 lb (80.287 kg)  03/09/14 177 lb 12 oz (80.627 kg)  03/02/14 177 lb 1.9 oz (80.341 kg)      HEENT: nl dentition, turbinates, and orophanx. Nl external ear canals without cough reflex   NECK :  without JVD/Nodes/TM/ nl carotid upstrokes bilaterally   LUNGS: no acc muscle use, clear to A and P bilaterally without cough on insp or exp maneuvers   CV:  RRR  no s3 or murmur or increase in P2, no edema   ABD:  soft and nontender with nl excursion in the supine position. No bruits or organomegaly, bowel sounds nl  MS:  warm without deformities, calf tenderness, cyanosis or clubbing  SKIN: warm and dry without lesions    NEURO:  alert, approp, no deficits    CXR  03/22/2014 :  Small left pleural effusion and left basilar opacity favored to  reflect atelectasis. Infiltrate is difficult to exclude entirely.  Stable cardiomegaly and aortic atherosclerosis without evidence of  failure       Assessment & Plan:

## 2014-03-22 NOTE — Progress Notes (Signed)
Quick Note:  Spoke with pt and notified of results per Dr. Wert. Pt verbalized understanding and denied any questions.  ______ 

## 2014-03-22 NOTE — Assessment & Plan Note (Addendum)
The proper method of use, as well as anticipated side effects, of a metered-dose inhaler are discussed and demonstrated to the patient. Improved effectiveness after extensive coaching during this visit to a level of approximately  10%, no better  She does not actually meet the criteria for copd as the fev1/FVC ratio is over 70 but does air trap a bit and has low dlco so emphysema is likely present and better option would be LAMA maint Rx if we were really convinced her airflow stops her from doing desired activities   ie  although there may be copd present, it may not be clinically relevant:   it does not appear to be limiting activity tolerance any more than a set of worn tires limits someone from driving a car  around a parking lot.  A new set of Michelins might look good but would have no perceived impact on the performance of the car and would not be worth the cost.  That is to say:   this pt is so sedentary I don't recommend aggressive pulmonary rx at this point unless limiting symptoms arise or acute exacerbations become as issue, neither of which is the case now.  I asked the patient to contact this office at any time in the future should either of these problems arise.    Prn saba or saba/sama in meantime should be fine but note very poor effectiveness  The proper method of use, as well as anticipated side effects, of a metered-dose inhaler are discussed and demonstrated to the patient. Improved effectiveness after extensive coaching during this visit to a level of approximately  < 10% from a baseline of near 0

## 2014-03-22 NOTE — Patient Instructions (Signed)
Just use the inhaler (either one is fine) up to every 4 hours if can't catch your breath, not on a regular basis  Please remember to go to the lab and x-ray department downstairs for your tests - we will call you with the results when they are available.     If you find you are consistently limited from desired activities by breathing then return to see Dr Vassie LollAlva to consider alternative longterm inhalers

## 2014-03-23 ENCOUNTER — Ambulatory Visit: Payer: Medicare Other | Admitting: Psychology

## 2014-03-24 ENCOUNTER — Ambulatory Visit (INDEPENDENT_AMBULATORY_CARE_PROVIDER_SITE_OTHER): Payer: Medicare Other | Admitting: Internal Medicine

## 2014-03-24 ENCOUNTER — Encounter: Payer: Self-pay | Admitting: Internal Medicine

## 2014-03-24 ENCOUNTER — Ambulatory Visit: Payer: Medicare Other | Admitting: Internal Medicine

## 2014-03-24 ENCOUNTER — Other Ambulatory Visit: Payer: Self-pay | Admitting: *Deleted

## 2014-03-24 VITALS — BP 144/88 | HR 77 | Temp 98.5°F | Wt 175.5 lb

## 2014-03-24 DIAGNOSIS — R059 Cough, unspecified: Secondary | ICD-10-CM

## 2014-03-24 DIAGNOSIS — R05 Cough: Secondary | ICD-10-CM

## 2014-03-24 MED ORDER — ALBUTEROL SULFATE HFA 108 (90 BASE) MCG/ACT IN AERS: 2.0000 | INHALATION_SPRAY | Freq: Four times a day (QID) | RESPIRATORY_TRACT | Status: AC | PRN

## 2014-03-24 MED ORDER — BACLOFEN 10 MG PO TABS: 10.0000 mg | ORAL_TABLET | Freq: Three times a day (TID) | ORAL | Status: DC

## 2014-03-24 NOTE — Patient Instructions (Addendum)

## 2014-03-24 NOTE — Assessment & Plan Note (Signed)
-   hfa < 10% effective 03/22/2014  - 03/22/2014   Walked RA x one lap @ 185 stopped due to  Unsteady with walker, no correlation between pulse at apex and pulse ox reading  No evidence that the desats are real or are really stopping her from desired activities as she is close to w/c bound from being so unsteady on her feet and at risk of falling so no 02 indicatd

## 2014-03-24 NOTE — Progress Notes (Signed)
Subjective:    Patient ID: Paula LarsenSusan R [name], female    DOB: [birth year], 80 y.o.   MRN: [id number]  HPI  Pt presents to the clinic today with c/o cough. This started last night. The cough is unproductive. She denies fever, chills or body aches. She has been taking albuterol and cough syrup for the cough.  Review of Systems      Past Medical History  Diagnosis Date  . Depression   . Ejection fraction     EF 40%, echo, November, 2012  / in improved, EF 60%, echo, December, 2012  . Contrast media allergy     Patient feels poorly with contrast  . Status post AAA (abdominal aortic aneurysm) repair     Surgical repair, Dr. Hart RochesterLawson, December 27, 2011  . Pre-syncope     vasovagal w/ heart block  . Parotid mass     has refused further eval 10/2011  . GERD (gastroesophageal reflux disease)   . Anxiety   . Facial tic     L sided spasms - Botox trial summer 2013, ?effective  . Macular degeneration   . AAA (abdominal aortic aneurysm)     s/p repair 12/2011  . Congestive heart failure     NL EF 10/2011 echo   . Diabetes mellitus type II, controlled   . Hypothyroidism   . Sinus bradycardia 09/19/2011    Occurring simultaneously with complete heart block   . Hypertension   . COPD (chronic obstructive pulmonary disease)   . Facial spasm     L hemifacial-treated w/ botox xeomin -Dr Terrace ArabiaYan    Current Outpatient Prescriptions  Medication Sig Dispense Refill  . acetaminophen (TYLENOL) 500 MG tablet Take 500 mg by mouth every 6 (six) hours as needed (pain).      Marland Kitchen. albuterol (PROVENTIL HFA;VENTOLIN HFA) 108 (90 BASE) MCG/ACT inhaler Inhale 2 puffs into the lungs every 6 (six) hours as needed for wheezing or shortness of breath.  1 Inhaler  0  . albuterol-ipratropium (COMBIVENT) 18-103 MCG/ACT inhaler Inhale 2 puffs into the lungs every 6 (six) hours as needed for wheezing or shortness of breath.      . ALPRAZolam (XANAX) 0.5 MG tablet Take 1 tablet (0.5 mg total) by mouth 3 (three) times daily.  90  tablet  5  . aspirin (ECOTRIN LOW STRENGTH) 81 MG EC tablet Take 1 tablet (81 mg total) by mouth daily. Swallow whole.  30 tablet  12  . baclofen (LIORESAL) 10 MG tablet Take 10 mg by mouth 3 (three) times daily.      . calcium carbonate (OS-CAL) 600 MG TABS Take 600 mg by mouth every other day.       . carvedilol (COREG) 6.25 MG tablet Take 6.25 mg by mouth 2 (two) times daily with a meal.      . cetirizine (ZYRTEC) 10 MG tablet Take 10 mg by mouth daily as needed for allergies.      . Cholecalciferol (VITAMIN D3) 5000 UNITS TABS Take 5,000 Units by mouth every other day.       . furosemide (LASIX) 20 MG tablet Take 20 mg by mouth daily.       Marland Kitchen. gabapentin (NEURONTIN) 100 MG capsule Take 200 mg by mouth at bedtime.       Marland Kitchen. levothyroxine (SYNTHROID, LEVOTHROID) 175 MCG tablet Take 1 tablet (175 mcg total) by mouth daily before breakfast. 02/03/14:1 daily EXCEPT 1/2 on Tues & Thurs; check TSH 10 weeks      .  metFORMIN (GLUCOPHAGE-XR) 500 MG 24 hr tablet Take 250 mg by mouth at bedtime.      . mometasone (NASONEX) 50 MCG/ACT nasal spray Place 2 sprays into the nose daily as needed (allergies).      . ondansetron (ZOFRAN) 4 MG tablet Take 4 mg by mouth every 8 (eight) hours as needed for nausea or vomiting.      . pantoprazole (PROTONIX) 40 MG tablet Take 40 mg by mouth daily.      Bertram Gala. Polyethyl Glycol-Propyl Glycol (SYSTANE) 0.4-0.3 % SOLN Apply 1 drop to eye 2 (two) times daily as needed (dry eyes).      . potassium chloride SA (K-DUR,KLOR-CON) 20 MEQ tablet Take 1 tablet by mouth  daily  90 tablet  3  . sertraline (ZOLOFT) 50 MG tablet Take 1 tablet (50 mg total) by mouth daily. 1/2 qd       No current facility-administered medications for this visit.    Allergies  Allergen Reactions  . Bactrim [Sulfamethoxazole-Tmp Ds]   . Contrast Media [Iodinated Diagnostic Agents] Anaphylaxis  . Gadolinium Derivatives   . Iohexol Anaphylaxis, Shortness Of Breath and Swelling     Desc: PT STATES SHE HAD A  SEVERE REACTION TO IV CONRAST WITH THROAT SWELLING AND SOB. SHE WAS ADMITTED TO THE HOSPITAL. SHE HAS NEVER HAD CONTRAST AGAIN.   Marland Kitchen. Penicillins Rash  . Sulfa Antibiotics Anaphylaxis  . Avelox [Moxifloxacin Hcl In Nacl] Rash  . Ciprofloxacin Itching  . Delsym [Dextromethorphan] Other (See Comments)    Hallucination  . Hydromet [Hydrocodone-Homatropine] Other (See Comments)    Pt states med make her hallucinate    Family History  Problem Relation Age of Onset  . Esophageal cancer Father   . Cancer Father   . Breast cancer Sister   . Cancer Sister   . Colon cancer Sister   . Heart disease Sister   . Heart disease Mother   . Heart disease Son     History   Social History  . Marital Status: Married    Spouse Name: Reita ClicheBobby    Number of Children: 4  . Years of Education: 9th   Occupational History  . retired Other    worked for 25 years, hairdresser   Social History Main Topics  . Smoking status: Former Smoker -- 1.00 packs/day for 60 years    Types: Cigarettes    Quit date: 08/31/2011  . Smokeless tobacco: Never Used  . Alcohol Use: No  . Drug Use: No  . Sexual Activity: Not on file   Other Topics Concern  . Not on file   Social History Narrative   Patient is married [name]([name]) and lives at home with her husband.   Patient worked as a Interior and spatial designerhairdresser for 25 years.   Patient drinks a caffeine beverage daily.   Patient is right-handed.     Constitutional: Denies fever, malaise, fatigue, headache or abrupt weight changes.  HEENT: Denies eye pain, eye redness, ear pain, ringing in the ears, wax buildup, runny nose, nasal congestion, bloody nose, or sore throat. Respiratory: Pt reports cough. Denies difficulty breathing, shortness of breath, or sputum production.     No other specific complaints in a complete review of systems (except as listed in HPI above).  Objective:   Physical Exam  BP 144/88  Pulse 77  Temp(Src) 98.5 F (36.9 C) (Oral)  Wt 175 lb 8 oz (79.606  kg)  SpO2 96% Wt Readings from Last 3 Encounters:  03/24/14 175 lb 8 oz (  79.606 kg)  03/22/14 177 lb (80.287 kg)  03/09/14 177 lb 12 oz (80.627 kg)    General: Appears her stated age, chronically ill appearing in NAD. HEENT: Head: normal shape and size; Eyes: sclera white, no icterus, conjunctiva pink, PERRLA and EOMs intact; Ears: Tm's gray and intact, normal light reflex; Nose: mucosa pink and moist, septum midline; Throat/Mouth: Teeth present, mucosa pink and dry, no exudate, lesions or ulcerations noted.   Cardiovascular: Normal rate and rhythm. S1,S2 noted.  No murmur, rubs or gallops noted. No JVD or BLE edema. No carotid bruits noted. Pulmonary/Chest: Normal effort and positive vesicular breath sounds. No respiratory distress. No wheezes, rales or ronchi noted.    BMET    Component Value Date/Time   NA 142 03/22/2014 1054   K 4.0 03/22/2014 1054   CL 106 03/22/2014 1054   CO2 28 03/22/2014 1054   GLUCOSE 118* 03/22/2014 1054   BUN 16 03/22/2014 1054   CREATININE 0.9 03/22/2014 1054   CREATININE 0.80 12/23/2011 0824   CALCIUM 10.1 03/22/2014 1054   GFRNONAA 49* 03/11/2014 2226   GFRAA 57* 03/11/2014 2226    Lipid Panel     Component Value Date/Time   CHOL 153 11/07/2013 0845   TRIG 282* 11/07/2013 0845   HDL 28* 11/07/2013 0845   CHOLHDL 5.5 11/07/2013 0845   VLDL 56* 11/07/2013 0845   LDLCALC 69 11/07/2013 0845    CBC    Component Value Date/Time   WBC 10.3 03/22/2014 1054   RBC 3.41* 03/22/2014 1054   HGB 11.5* 03/22/2014 1054   HCT 34.5* 03/22/2014 1054   PLT 294.0 03/22/2014 1054   MCV 101.3* 03/22/2014 1054   MCH 33.6 03/11/2014 2226   MCHC 33.4 03/22/2014 1054   RDW 15.9* 03/22/2014 1054   LYMPHSABS 2.6 03/22/2014 1054   MONOABS 0.6 03/22/2014 1054   EOSABS 0.5 03/22/2014 1054   BASOSABS 0.0 03/22/2014 1054    Hgb A1C Lab Results  Component Value Date   HGBA1C 7.4* 02/02/2014         Assessment & Plan:   Cough:  Lungs are clear Could be viral The cough syrup  is effective Advised her to continue

## 2014-03-24 NOTE — Progress Notes (Signed)
Pre visit review using our clinic review tool, if applicable. No additional management support is needed unless otherwise documented below in the visit note. 

## 2014-03-26 ENCOUNTER — Other Ambulatory Visit: Payer: Self-pay | Admitting: Internal Medicine

## 2014-03-28 ENCOUNTER — Encounter: Payer: Self-pay | Admitting: Internal Medicine

## 2014-03-28 ENCOUNTER — Ambulatory Visit (INDEPENDENT_AMBULATORY_CARE_PROVIDER_SITE_OTHER): Payer: Medicare Other | Admitting: Internal Medicine

## 2014-03-28 VITALS — BP 134/78 | HR 82 | Temp 98.5°F | Wt 176.0 lb

## 2014-03-28 DIAGNOSIS — R300 Dysuria: Secondary | ICD-10-CM

## 2014-03-28 DIAGNOSIS — R197 Diarrhea, unspecified: Secondary | ICD-10-CM

## 2014-03-28 DIAGNOSIS — R110 Nausea: Secondary | ICD-10-CM

## 2014-03-28 LAB — POCT URINALYSIS DIPSTICK
Glucose, UA: NEGATIVE
Ketones, UA: NEGATIVE
LEUKOCYTES UA: NEGATIVE
NITRITE UA: NEGATIVE
PH UA: 5
Spec Grav, UA: >=1.03
Urobilinogen, UA: 0.2

## 2014-03-28 NOTE — Progress Notes (Signed)
Pre visit review using our clinic review tool, if applicable. No additional management support is needed unless otherwise documented below in the visit note. 

## 2014-03-28 NOTE — Patient Instructions (Addendum)

## 2014-03-28 NOTE — Progress Notes (Signed)
Subjective:    Patient ID: Paula [name], female    DOB: [birth year], 80 y.o.   MRN: [id number]  HPI  Pt presents to the clinic today with c/o dysuria. She had similar symptoms 4/29. Was placed on Macrobid. She went to the ER 5/1 feeling worse. She was given 1 gm of Rocephin and her abx was changed to cefuroxime. She has finished that course of abx but reports "I still don't feel good." She c/o "burning when I pee", nausea and 1 episode of diarrhea this am. She denies fever, chills or low back pain.  Review of Systems      Past Medical History  Diagnosis Date  . Depression   . Ejection fraction     EF 40%, echo, November, 2012  / in improved, EF 60%, echo, December, 2012  . Contrast media allergy     Patient feels poorly with contrast  . Status post AAA (abdominal aortic aneurysm) repair     Surgical repair, Dr. Hart Rochester, December 27, 2011  . Pre-syncope     vasovagal w/ heart block  . Parotid mass     has refused further eval 10/2011  . GERD (gastroesophageal reflux disease)   . Anxiety   . Facial tic     L sided spasms - Botox trial summer 2013, ?effective  . Macular degeneration   . AAA (abdominal aortic aneurysm)     s/p repair 12/2011  . Congestive heart failure     NL EF 10/2011 echo   . Diabetes mellitus type II, controlled   . Hypothyroidism   . Sinus bradycardia 09/19/2011    Occurring simultaneously with complete heart block   . Hypertension   . COPD (chronic obstructive pulmonary disease)   . Facial spasm     L hemifacial-treated w/ botox xeomin -Dr Terrace Arabia    Current Outpatient Prescriptions  Medication Sig Dispense Refill  . acetaminophen (TYLENOL) 500 MG tablet Take 500 mg by mouth every 6 (six) hours as needed (pain).      Marland Kitchen albuterol (PROVENTIL HFA;VENTOLIN HFA) 108 (90 BASE) MCG/ACT inhaler Inhale 2 puffs into the lungs every 6 (six) hours as needed for wheezing or shortness of breath.  3 Inhaler  0  . albuterol-ipratropium (COMBIVENT) 18-103 MCG/ACT inhaler  Inhale 2 puffs into the lungs every 6 (six) hours as needed for wheezing or shortness of breath.      . ALPRAZolam (XANAX) 0.5 MG tablet Take 1 tablet (0.5 mg total) by mouth 3 (three) times daily.  90 tablet  5  . aspirin (ECOTRIN LOW STRENGTH) 81 MG EC tablet Take 1 tablet (81 mg total) by mouth daily. Swallow whole.  30 tablet  12  . baclofen (LIORESAL) 10 MG tablet Take 1 tablet (10 mg total) by mouth 3 (three) times daily.  270 each  0  . calcium carbonate (OS-CAL) 600 MG TABS Take 600 mg by mouth every other day.       . carvedilol (COREG) 6.25 MG tablet Take 6.25 mg by mouth 2 (two) times daily with a meal.      . carvedilol (COREG) 6.25 MG tablet TAKE 1 TABLET BY MOUTH 2 TIMES DAILY WITH A MEAL.  180 tablet  1  . cetirizine (ZYRTEC) 10 MG tablet Take 10 mg by mouth daily as needed for allergies.      . Cholecalciferol (VITAMIN D3) 5000 UNITS TABS Take 5,000 Units by mouth every other day.       . furosemide (LASIX) 20  MG tablet Take 20 mg by mouth daily.       Marland Kitchen. gabapentin (NEURONTIN) 100 MG capsule Take 200 mg by mouth at bedtime.       Marland Kitchen. levothyroxine (SYNTHROID, LEVOTHROID) 175 MCG tablet Take 1 tablet (175 mcg total) by mouth daily before breakfast. 02/03/14:1 daily EXCEPT 1/2 on Tues & Thurs; check TSH 10 weeks      . metFORMIN (GLUCOPHAGE-XR) 500 MG 24 hr tablet Take 250 mg by mouth at bedtime.      . mometasone (NASONEX) 50 MCG/ACT nasal spray Place 2 sprays into the nose daily as needed (allergies).      . ondansetron (ZOFRAN) 4 MG tablet Take 4 mg by mouth every 8 (eight) hours as needed for nausea or vomiting.      . pantoprazole (PROTONIX) 40 MG tablet Take 40 mg by mouth daily.      Bertram Gala. Polyethyl Glycol-Propyl Glycol (SYSTANE) 0.4-0.3 % SOLN Apply 1 drop to eye 2 (two) times daily as needed (dry eyes).      . potassium chloride SA (K-DUR,KLOR-CON) 20 MEQ tablet Take 1 tablet by mouth  daily  90 tablet  3  . sertraline (ZOLOFT) 50 MG tablet Take 1 tablet (50 mg total) by mouth  daily. 1/2 qd       No current facility-administered medications for this visit.    Allergies  Allergen Reactions  . Bactrim [Sulfamethoxazole-Tmp Ds]   . Contrast Media [Iodinated Diagnostic Agents] Anaphylaxis  . Gadolinium Derivatives   . Iohexol Anaphylaxis, Shortness Of Breath and Swelling     Desc: PT STATES SHE HAD A SEVERE REACTION TO IV CONRAST WITH THROAT SWELLING AND SOB. SHE WAS ADMITTED TO THE HOSPITAL. SHE HAS NEVER HAD CONTRAST AGAIN.   Marland Kitchen. Penicillins Rash  . Sulfa Antibiotics Anaphylaxis  . Avelox [Moxifloxacin Hcl In Nacl] Rash  . Ciprofloxacin Itching  . Delsym [Dextromethorphan] Other (See Comments)    Hallucination  . Hydromet [Hydrocodone-Homatropine] Other (See Comments)    Pt states med make her hallucinate    Family History  Problem Relation Age of Onset  . Esophageal cancer Father   . Cancer Father   . Breast cancer Sister   . Cancer Sister   . Colon cancer Sister   . Heart disease Sister   . Heart disease Mother   . Heart disease Son     History   Social History  . Marital Status: Married    Spouse Name: Reita ClicheBobby    Number of Children: 4  . Years of Education: 9th   Occupational History  . retired Other    worked for 25 years, hairdresser   Social History Main Topics  . Smoking status: Former Smoker -- 1.00 packs/day for 60 years    Types: Cigarettes    Quit date: 08/31/2011  . Smokeless tobacco: Never Used  . Alcohol Use: No  . Drug Use: No  . Sexual Activity: Not on file   Other Topics Concern  . Not on file   Social History Narrative   Patient is married [name]([name]) and lives at home with her husband.   Patient worked as a Interior and spatial designerhairdresser for 25 years.   Patient drinks a caffeine beverage daily.   Patient is right-handed.     Constitutional: Pt reports fatigue. Denies fever, malaise,  headache or abrupt weight changes.  Respiratory: Denies difficulty breathing, shortness of breath, cough or sputum production.   Cardiovascular:  Denies chest pain, chest tightness, palpitations or swelling in the hands or  feet.  Gastrointestinal: Denies abdominal pain, bloating, constipation,  or blood in the stool.  GU: Pt reports dysuria. Denies urgency, frequency,  burning sensation, blood in urine, odor or discharge.   No other specific complaints in a complete review of systems (except as listed in HPI above).  Objective:   Physical Exam  BP 134/78  Pulse 82  Temp(Src) 98.5 F (36.9 C) (Oral)  Wt 176 lb (79.833 kg) Wt Readings from Last 3 Encounters:  03/28/14 176 lb (79.833 kg)  03/24/14 175 lb 8 oz (79.606 kg)  03/22/14 177 lb (80.287 kg)    General: Appears her stated age, chronically ill appearing, in NAD. Cardiovascular: Normal rate and rhythm. S1,S2 noted.  No murmur, rubs or gallops noted. No JVD or BLE edema. No carotid bruits noted. Pulmonary/Chest: Normal effort and positive vesicular breath sounds. No respiratory distress. No wheezes, rales or ronchi noted.  Abdomen: Soft and nontender. Normal bowel sounds, no bruits noted. No distention or masses noted. Liver, spleen and kidneys non palpable.   BMET    Component Value Date/Time   NA 142 03/22/2014 1054   K 4.0 03/22/2014 1054   CL 106 03/22/2014 1054   CO2 28 03/22/2014 1054   GLUCOSE 118* 03/22/2014 1054   BUN 16 03/22/2014 1054   CREATININE 0.9 03/22/2014 1054   CREATININE 0.80 12/23/2011 0824   CALCIUM 10.1 03/22/2014 1054   GFRNONAA 49* 03/11/2014 2226   GFRAA 57* 03/11/2014 2226    Lipid Panel     Component Value Date/Time   CHOL 153 11/07/2013 0845   TRIG 282* 11/07/2013 0845   HDL 28* 11/07/2013 0845   CHOLHDL 5.5 11/07/2013 0845   VLDL 56* 11/07/2013 0845   LDLCALC 69 11/07/2013 0845    CBC    Component Value Date/Time   WBC 10.3 03/22/2014 1054   RBC 3.41* 03/22/2014 1054   HGB 11.5* 03/22/2014 1054   HCT 34.5* 03/22/2014 1054   PLT 294.0 03/22/2014 1054   MCV 101.3* 03/22/2014 1054   MCH 33.6 03/11/2014 2226   MCHC 33.4 03/22/2014 1054    RDW 15.9* 03/22/2014 1054   LYMPHSABS 2.6 03/22/2014 1054   MONOABS 0.6 03/22/2014 1054   EOSABS 0.5 03/22/2014 1054   BASOSABS 0.0 03/22/2014 1054    Hgb A1C Lab Results  Component Value Date   HGBA1C 7.4* 02/02/2014         Assessment & Plan:   Dysuria:  Repeat urinalysis: mod blood, small bilirubin Will send urine culture as well No indication for antibiotic at this time Advised her to increase her fluid intake Ok to take Imodium x 1 if needed for diarrhea  RTC as needed or if symptoms persist or worsen

## 2014-03-29 ENCOUNTER — Ambulatory Visit: Payer: Medicare Other | Admitting: Physical Therapy

## 2014-03-29 ENCOUNTER — Other Ambulatory Visit: Payer: Self-pay | Admitting: Internal Medicine

## 2014-03-29 DIAGNOSIS — IMO0001 Reserved for inherently not codable concepts without codable children: Secondary | ICD-10-CM | POA: Diagnosis not present

## 2014-03-30 LAB — URINE CULTURE

## 2014-04-01 ENCOUNTER — Ambulatory Visit: Payer: Medicare Other | Admitting: Physical Therapy

## 2014-04-01 DIAGNOSIS — IMO0001 Reserved for inherently not codable concepts without codable children: Secondary | ICD-10-CM | POA: Diagnosis not present

## 2014-04-05 ENCOUNTER — Encounter: Payer: Self-pay | Admitting: Neurology

## 2014-04-05 ENCOUNTER — Ambulatory Visit (INDEPENDENT_AMBULATORY_CARE_PROVIDER_SITE_OTHER): Payer: Medicare Other | Admitting: Neurology

## 2014-04-05 VITALS — BP 158/89 | HR 73

## 2014-04-05 DIAGNOSIS — F0390 Unspecified dementia without behavioral disturbance: Secondary | ICD-10-CM

## 2014-04-05 DIAGNOSIS — G518 Other disorders of facial nerve: Secondary | ICD-10-CM

## 2014-04-05 DIAGNOSIS — F068 Other specified mental disorders due to known physiological condition: Secondary | ICD-10-CM

## 2014-04-05 DIAGNOSIS — G5139 Clonic hemifacial spasm, unspecified: Secondary | ICD-10-CM

## 2014-04-05 NOTE — Addendum Note (Signed)
Addended by: Melvyn Novas on: 04/05/2014 02:00 PM   Modules accepted: Orders, Level of Service

## 2014-04-05 NOTE — Patient Instructions (Signed)
Dystonias  The dystonias are movement disorders in which sustained muscle contractions cause twisting and repetitive movements or abnormal postures. The movements, which are involuntary and sometimes painful, may affect a single muscle; a group of muscles such as those in the arms, legs, or neck; or the entire body. Early symptoms (problems) may include a deterioration in handwriting after writing several lines, foot cramps, and a tendency of one foot to pull up or drag after running or walking some distance. Other possible symptoms are tremor and voice or speech difficulties. Birth injury (particularly due to lack of oxygen), certain infections, reactions to certain drugs, heavy-metal or carbon monoxide poisoning, trauma (damage caused by an accident), or stroke can cause dystonic symptoms. About half the cases of dystonia have no connection to disease or injury and are called primary or idiopathic dystonia. Of the primary dystonias, many cases appear to be inherited in a dominant manner. Dystonias can also be symptoms of other diseases, some of which may be hereditary (passed down from parents). In some individuals, symptoms of a dystonia appear spontaneously in childhood between the ages of 5 and 16, usually in the foot or in the hand. For other individuals, the symptoms emerge in late adolescence or early adulthood.  TREATMENT   No one treatment has been found universally effective for dystonia. Instead, physicians use a variety of therapies (medications, surgery and other treatments such as physical therapy, splinting, stress management, and biofeedback), aimed at reducing or eliminating muscle spasms and pain. Since response to drugs varies among patients and even in the same person over time, the therapy must be individualized.  PROGNOSIS  The initial symptoms can be very mild and may be noticeable only after prolonged exertion, stress, or fatigue. Over a period of time, the symptoms may become more  noticeable and widespread and be unrelenting; sometimes, however, there is little or no progression.  RESEARCH BEING DONE  Investigators believe that the dystonias result from an abnormality in an area of the brain called the basal ganglia, where some of the messages that initiate muscle contractions are processed. Scientists suspect a defect in the body's ability to process a group of chemicals called neurotransmitters that help cells in the brain communicate with each other. Scientists at the [facility] laboratories have conducted detailed investigations of the pattern of muscle activity in persons with dystonias. Studies using EEG analysis and neuroimaging are probing brain activity. The search for the gene or genes responsible for some forms of dominantly inherited dystonias continues. In 1989, a team of researchers mapped a gene for early-onset torsion dystonia to chromosome 9; the gene was subsequently named DYT1. In 1997, the team sequenced the DYT1 gene and found that it codes for a previously unknown protein now called "torsin A."  Document Released: 10/18/2002 Document Revised: 01/20/2012 Document Reviewed: 10/28/2005  [employer]® Patient Information ©2014 [employer].

## 2014-04-05 NOTE — Progress Notes (Signed)
[facility] [address] [place]. [place] [address] [phone]                                                                                                              OFFICE CONSULT NOTE  [name] Date of Birth:  [birth year] Medical Record Number:  [id number]   Referring MD:  Azalia Bilis, MD  Reason for Referral:  TIA and [facility] visit for movement disorder-hemi  facial spasm.  Seen 03-15-14 at [facility] , for leukocytosis and fatigue - labs reviewed and discussed with patient. Suspect dementia , MMSE 20-30    Parotis gland stone, growth on vocal cord.  Dystonic neck.  CHF.   HPI: 65 year Caucasian lady refrred for TIA in December 2014. History  provided by patient and husband as well as review of hospital admission records. i was unable to review the Epic notes from [facility] .  She developed sudden onset left facial droop and difficulty speaking on 11/06/13 and was admitted for 2 days and underwent CT head and brain MRI which I personally reviewed showed no acute abnormality and only changes of chronic small vessel disease.Transthoraxic echo showed normal ejection fraction.carotid dopplers showed no significant stenosis. Lipid profile was normal.She was started on aspirin .She has no prior h/o stroke or TIA. She has h/o hemifacial spasms since 2011  for which she gets xeomin injections by Dr Terrace Arabia with good benefit and also takes baclofen prn. She feels she is on too many medicines and plans to discuss this with Dr Felicity Coyer  She takes Xanax and zoloft.  She has chronic dysphonia and slurred speech, possible contribution form Botox/ Xeomin injection. Her swallowing is delayed , too.   ROS:   14 system review of systems is positive for face twitchings,tightness, speech difficulty,gait difficulty and all other systems negative  GDS 6.5 points- indicative of depression.   Epworth 15 , naps daily. FSS 45 points. Facial twitching - not visible  on the outside, but bothering the patient.  Slurred speech, dry mouth - botox effect? CHF and wahite matter disease- Repetitive questions.  MMSE 20-30 on 04-04-14 .   PMH:  Past Medical History  Diagnosis Date  . Depression   . Ejection fraction     EF 40%, echo, November, 2012  / in improved, EF 60%, echo, December, 2012  . Contrast media allergy     Patient feels poorly with contrast  . Status post AAA (abdominal aortic aneurysm) repair     Surgical repair, Dr. Hart Rochester, December 27, 2011  . Pre-syncope     vasovagal w/ heart block  . Parotid mass     has refused further eval 10/2011  . GERD (gastroesophageal reflux disease)   . Anxiety   . Facial tic     L sided spasms - Botox trial summer 2013, ?effective  . Macular degeneration   . AAA (abdominal aortic aneurysm)     s/p repair 12/2011  . Congestive heart failure     NL EF  10/2011 echo   . Diabetes mellitus type II, controlled   . Hypothyroidism   . Sinus bradycardia 09/19/2011    Occurring simultaneously with complete heart block   . Hypertension   . COPD (chronic obstructive pulmonary disease)   . Facial spasm     L hemifacial-treated w/ botox xeomin -Dr Terrace Arabia    Social History:  History   Social History  . Marital Status: Married    Spouse Name: Paula [name]    Number of Children: 4  . Years of Education: 9th   Occupational History  . retired Other    worked for 25 years, hairdresser   Social History Main Topics  . Smoking status: Former Smoker -- 1.00 packs/day for 60 years    Types: Cigarettes    Quit date: 08/31/2011  . Smokeless tobacco: Never Used  . Alcohol Use: No  . Drug Use: No  . Sexual Activity: Not on file   Other Topics Concern  . Not on file   Social History Narrative   Patient is married Paula [name]) and lives at home with her husband.   Patient worked as a Interior and spatial designer for 25 years.   Patient drinks a caffeine beverage daily.   Patient is right-handed.   Patient has four children, three are  deceased and one child is living.    Medications:   Current Outpatient Prescriptions on File Prior to Visit  Medication Sig Dispense Refill  . acetaminophen (TYLENOL) 500 MG tablet Take 500 mg by mouth every 6 (six) hours as needed (pain).      Marland Kitchen albuterol (PROVENTIL HFA;VENTOLIN HFA) 108 (90 BASE) MCG/ACT inhaler Inhale 2 puffs into the lungs every 6 (six) hours as needed for wheezing or shortness of breath.  3 Inhaler  0  . albuterol-ipratropium (COMBIVENT) 18-103 MCG/ACT inhaler Inhale 2 puffs into the lungs every 6 (six) hours as needed for wheezing or shortness of breath.      . ALPRAZolam (XANAX) 0.5 MG tablet Take 1 tablet (0.5 mg total) by mouth 3 (three) times daily.  90 tablet  5  . aspirin (ECOTRIN LOW STRENGTH) 81 MG EC tablet Take 1 tablet (81 mg total) by mouth daily. Swallow whole.  30 tablet  12  . baclofen (LIORESAL) 10 MG tablet Take 1 tablet (10 mg total) by mouth 3 (three) times daily.  270 each  0  . benzonatate (TESSALON) 200 MG capsule TAKE 1 CAPSULE BY MOUTH AT BEDTIME AS NEEDED FOR COUGH.  20 capsule  0  . calcium carbonate (OS-CAL) 600 MG TABS Take 600 mg by mouth every other day.       . carvedilol (COREG) 6.25 MG tablet Take 6.25 mg by mouth 2 (two) times daily with a meal.      . carvedilol (COREG) 6.25 MG tablet TAKE 1 TABLET BY MOUTH 2 TIMES DAILY WITH A MEAL.  180 tablet  1  . cetirizine (ZYRTEC) 10 MG tablet Take 10 mg by mouth daily as needed for allergies.      . Cholecalciferol (VITAMIN D3) 5000 UNITS TABS Take 5,000 Units by mouth every other day.       . furosemide (LASIX) 20 MG tablet Take 20 mg by mouth daily.       Marland Kitchen gabapentin (NEURONTIN) 100 MG capsule Take 200 mg by mouth at bedtime.       Marland Kitchen levothyroxine (SYNTHROID, LEVOTHROID) 175 MCG tablet Take 1 tablet (175 mcg total) by mouth daily before breakfast. 02/03/14:1 daily EXCEPT 1/2 on Tues &  Thurs; check TSH 10 weeks      . metFORMIN (GLUCOPHAGE-XR) 500 MG 24 hr tablet Take 250 mg by mouth at bedtime.       . mometasone (NASONEX) 50 MCG/ACT nasal spray Place 2 sprays into the nose daily as needed (allergies).      . ondansetron (ZOFRAN) 4 MG tablet Take 4 mg by mouth every 8 (eight) hours as needed for nausea or vomiting.      . pantoprazole (PROTONIX) 40 MG tablet Take 40 mg by mouth daily.      Bertram Gala. Polyethyl Glycol-Propyl Glycol (SYSTANE) 0.4-0.3 % SOLN Apply 1 drop to eye 2 (two) times daily as needed (dry eyes).      . potassium chloride SA (K-DUR,KLOR-CON) 20 MEQ tablet Take 1 tablet by mouth  daily  90 tablet  3  . sertraline (ZOLOFT) 50 MG tablet Take 1 tablet (50 mg total) by mouth daily. 1/2 qd       No current facility-administered medications on file prior to visit.    Allergies:   Allergies  Allergen Reactions  . Bactrim [Sulfamethoxazole-Tmp Ds]   . Contrast Media [Iodinated Diagnostic Agents] Anaphylaxis  . Gadolinium Derivatives   . Iohexol Anaphylaxis, Shortness Of Breath and Swelling     Desc: PT STATES SHE HAD A SEVERE REACTION TO IV CONRAST WITH THROAT SWELLING AND SOB. SHE WAS ADMITTED TO THE HOSPITAL. SHE HAS NEVER HAD CONTRAST AGAIN.   Marland Kitchen. Penicillins Rash  . Sulfa Antibiotics Anaphylaxis  . Avelox [Moxifloxacin Hcl In Nacl] Rash  . Ciprofloxacin Itching  . Delsym [Dextromethorphan] Other (See Comments)    Hallucination  . Hydromet [Hydrocodone-Homatropine] Other (See Comments)    Pt states med make her hallucinate    Physical Exam General: well developed, well nourished, seated, in no evident distress Head: head normocephalic and atraumatic. Orohparynx benign Neck: supple with no carotid or supraclavicular bruits Cardiovascular: regular rate and rhythm, no murmurs Musculoskeletal: no deformity Skin:  no rash/petichiae Vascular:  Normal pulses all extremities  Neurologic Exam Mental Status: Awake and fully alert. Fatigued , slurred speech, not a droopy face, but a unable to whistle. .  Oriented to place and time.  Recent and remote memory idiminished.  Attention span, concentration and fund of knowledge diminished. Repetitive questions, over and over.    Cranial Nerves: Fundoscopic exam not done. Pupils equal, briskly reactive to light.  Extraocular movements full without nystagmus.  Visual fields full to confrontation. Hearing intact. Facial sensation intact.  Face, tongue, palate moves symmetrically. No facial twitchings or hemifacial spasms noted on the outside. She has a very tender . Motor: Normal bulk and tone. Normal strength in all tested extremity muscles. Sensory.: intact to touch and pinprick  Sensation but diminished vibratory sensation from ankle down.  Coordination: Rapid alternating movements normal in all extremities. Finger-to-nose and heel-to-shin performed accurately bilaterally. Gait and Station: Arises from chair with difficulty, needs assistance, walks now  with a walker.  Stance is broad based. Arthralgic, too  Gait demonstrates mild imbalance . Unable to heel, toe and tandem walk without difficulty.  Romberg deferred. Reflexes: 1+ and symmetric. Toes equivocal.       ASSESSMENT: [age] year Caucasian lady with transient left face weakness and dysarthria in December 2014 possibly TIA with vascular risk factors of diabetes, Hypertension, CHF and PAD.H/O left hemifacial spasms treated with Xeomin injections by Dr Terrace ArabiaYan. Mild cognitive impiarment versus mild dementia,     PLAN: I had a long discussion with the patient and husband  regarding her episode of transient left facial droop and slurred speech which was probably a TIA secondary to small vessel disease.   I recommend she stay on aspirin 325 mg daily and maintain aggressive risk factor control with pressure goal below 130/90, diabetes with hemoglobin A1c goal below 6.5% and lipids with LDL cholesterol goal below 70 mg percent.  She was advised to keep her upcoming followup visit with Dr. Terrace Arabia is PRN , [facility] revisits are PRN.  She will  Wait for 3 month before deciding if  new Xeomin injections are necessary. She may not develop the facial "twitching "again.      Melvyn Novas, MD   Note: This document was prepared with digital dictation and possible smart phrase technology. Any transcriptional errors that result from this process are unintentional.

## 2014-04-06 ENCOUNTER — Ambulatory Visit: Payer: Medicare Other | Admitting: Physical Therapy

## 2014-04-06 DIAGNOSIS — IMO0001 Reserved for inherently not codable concepts without codable children: Secondary | ICD-10-CM | POA: Diagnosis not present

## 2014-04-07 ENCOUNTER — Emergency Department (HOSPITAL_COMMUNITY): Payer: Medicare Other

## 2014-04-07 ENCOUNTER — Telehealth: Payer: Self-pay | Admitting: Neurology

## 2014-04-07 ENCOUNTER — Encounter (HOSPITAL_COMMUNITY): Payer: Self-pay | Admitting: Emergency Medicine

## 2014-04-07 ENCOUNTER — Ambulatory Visit: Payer: Medicare Other | Admitting: Internal Medicine

## 2014-04-07 ENCOUNTER — Emergency Department (HOSPITAL_COMMUNITY)
Admission: EM | Admit: 2014-04-07 | Discharge: 2014-04-07 | Disposition: A | Discharge status: Home / Self Care | Payer: Medicare Other | Attending: Emergency Medicine | Admitting: Emergency Medicine

## 2014-04-07 DIAGNOSIS — Z9849 Cataract extraction status, unspecified eye: Secondary | ICD-10-CM | POA: Insufficient documentation

## 2014-04-07 DIAGNOSIS — Z950 Presence of cardiac pacemaker: Secondary | ICD-10-CM | POA: Insufficient documentation

## 2014-04-07 DIAGNOSIS — F329 Major depressive disorder, single episode, unspecified: Secondary | ICD-10-CM | POA: Insufficient documentation

## 2014-04-07 DIAGNOSIS — IMO0002 Reserved for concepts with insufficient information to code with codable children: Secondary | ICD-10-CM | POA: Insufficient documentation

## 2014-04-07 DIAGNOSIS — I509 Heart failure, unspecified: Secondary | ICD-10-CM | POA: Insufficient documentation

## 2014-04-07 DIAGNOSIS — K219 Gastro-esophageal reflux disease without esophagitis: Secondary | ICD-10-CM | POA: Insufficient documentation

## 2014-04-07 DIAGNOSIS — Z95818 Presence of other cardiac implants and grafts: Secondary | ICD-10-CM | POA: Insufficient documentation

## 2014-04-07 DIAGNOSIS — G518 Other disorders of facial nerve: Secondary | ICD-10-CM | POA: Insufficient documentation

## 2014-04-07 DIAGNOSIS — E119 Type 2 diabetes mellitus without complications: Secondary | ICD-10-CM | POA: Insufficient documentation

## 2014-04-07 DIAGNOSIS — J4489 Other specified chronic obstructive pulmonary disease: Secondary | ICD-10-CM | POA: Insufficient documentation

## 2014-04-07 DIAGNOSIS — F411 Generalized anxiety disorder: Secondary | ICD-10-CM | POA: Insufficient documentation

## 2014-04-07 DIAGNOSIS — I498 Other specified cardiac arrhythmias: Secondary | ICD-10-CM | POA: Insufficient documentation

## 2014-04-07 DIAGNOSIS — N390 Urinary tract infection, site not specified: Secondary | ICD-10-CM | POA: Insufficient documentation

## 2014-04-07 DIAGNOSIS — Z7982 Long term (current) use of aspirin: Secondary | ICD-10-CM | POA: Insufficient documentation

## 2014-04-07 DIAGNOSIS — F3289 Other specified depressive episodes: Secondary | ICD-10-CM | POA: Insufficient documentation

## 2014-04-07 DIAGNOSIS — E039 Hypothyroidism, unspecified: Secondary | ICD-10-CM | POA: Insufficient documentation

## 2014-04-07 DIAGNOSIS — Z880 Allergy status to penicillin: Secondary | ICD-10-CM | POA: Insufficient documentation

## 2014-04-07 DIAGNOSIS — G5139 Clonic hemifacial spasm, unspecified: Secondary | ICD-10-CM

## 2014-04-07 DIAGNOSIS — Z79899 Other long term (current) drug therapy: Secondary | ICD-10-CM | POA: Insufficient documentation

## 2014-04-07 DIAGNOSIS — I10 Essential (primary) hypertension: Secondary | ICD-10-CM | POA: Insufficient documentation

## 2014-04-07 DIAGNOSIS — J449 Chronic obstructive pulmonary disease, unspecified: Secondary | ICD-10-CM | POA: Insufficient documentation

## 2014-04-07 DIAGNOSIS — Z87891 Personal history of nicotine dependence: Secondary | ICD-10-CM | POA: Insufficient documentation

## 2014-04-07 LAB — CBC WITH DIFFERENTIAL/PLATELET
BASOS ABS: 0.1 10*3/uL (ref 0.0–0.1)
BASOS PCT: 1 % (ref 0–1)
Eosinophils Absolute: 0.3 10*3/uL (ref 0.0–0.7)
Eosinophils Relative: 3 % (ref 0–5)
HEMATOCRIT: 36.5 % (ref 36.0–46.0)
Hemoglobin: 11.9 g/dL — ABNORMAL LOW (ref 12.0–15.0)
Lymphocytes Relative: 20 % (ref 12–46)
Lymphs Abs: 2.2 10*3/uL (ref 0.7–4.0)
MCH: 33.3 pg (ref 26.0–34.0)
MCHC: 32.6 g/dL (ref 30.0–36.0)
MCV: 102.2 fL — ABNORMAL HIGH (ref 78.0–100.0)
MONO ABS: 0.5 10*3/uL (ref 0.1–1.0)
Monocytes Relative: 5 % (ref 3–12)
Neutro Abs: 8.2 10*3/uL — ABNORMAL HIGH (ref 1.7–7.7)
Neutrophils Relative %: 71 % (ref 43–77)
Platelets: 298 10*3/uL (ref 150–400)
RBC: 3.57 MIL/uL — ABNORMAL LOW (ref 3.87–5.11)
RDW: 14.9 % (ref 11.5–15.5)
WBC: 11.3 10*3/uL — ABNORMAL HIGH (ref 4.0–10.5)

## 2014-04-07 LAB — URINALYSIS, ROUTINE W REFLEX MICROSCOPIC
Bilirubin Urine: NEGATIVE
Glucose, UA: NEGATIVE mg/dL
KETONES UR: NEGATIVE mg/dL
Nitrite: NEGATIVE
Protein, ur: NEGATIVE mg/dL
SPECIFIC GRAVITY, URINE: 1.019 (ref 1.005–1.030)
Urobilinogen, UA: 1 mg/dL (ref 0.0–1.0)
pH: 6.5 (ref 5.0–8.0)

## 2014-04-07 LAB — BASIC METABOLIC PANEL
BUN: 14 mg/dL (ref 6–23)
CHLORIDE: 104 meq/L (ref 96–112)
CO2: 26 mEq/L (ref 19–32)
CREATININE: 0.81 mg/dL (ref 0.50–1.10)
Calcium: 9.7 mg/dL (ref 8.4–10.5)
GFR, EST AFRICAN AMERICAN: 75 mL/min — AB (ref >90)
GFR, EST NON AFRICAN AMERICAN: 64 mL/min — AB (ref >90)
Glucose, Bld: 124 mg/dL — ABNORMAL HIGH (ref 70–99)
Potassium: 4.1 mEq/L (ref 3.7–5.3)
Sodium: 143 mEq/L (ref 137–147)

## 2014-04-07 LAB — URINE MICROSCOPIC-ADD ON

## 2014-04-07 MED ORDER — CEPHALEXIN 500 MG PO CAPS: 500.0000 mg | ORAL_CAPSULE | Freq: Two times a day (BID) | ORAL | Status: DC

## 2014-04-07 MED ORDER — ONDANSETRON 4 MG PO TBDP
4.0000 mg | ORAL_TABLET | Freq: Once | ORAL | Status: AC
  Administered 2014-04-07: 4 mg via ORAL
  Filled 2014-04-07: qty 1

## 2014-04-07 NOTE — ED Notes (Signed)
Bed: WA17 Expected date:  Expected time:  Means of arrival:  Comments: ems- elderly, face and neck pain, has been getting botox

## 2014-04-07 NOTE — ED Notes (Signed)
Notified MD pt is nauseated.  Orders given

## 2014-04-07 NOTE — ED Provider Notes (Signed)
CSN: [id number]     Arrival date & time 04/07/14  [id number] History   First MD Initiated Contact with Patient 04/07/14 [phone]     Chief Complaint  Patient presents with  . Nausea  . Pain     (Consider location/radiation/quality/duration/timing/severity/associated sxs/prior Treatment) HPI Comments: 80 yo female presenting with left sided facial pain.  She is very difficult to obtain a consistent and accurate history from.  However, she reports that she has left sided facial pain, which has been chronic for her.  It is attributed to a recurrent facial spasm.  The pain is moderate and feels like a "pulling" sensation.  She has taken botox injections in the past, but does not think they help.  Has not taken anything today for the pain.  Other than her facial pain, she also reports feeling "bad".  She is unable to provide much more detail about this, but her husband says she has been coughing some.  She was diagnosed with a "cold" and was told that it could "turn into pneumonia".  No fevers.     Past Medical History  Diagnosis Date  . Depression   . Ejection fraction     EF 40%, echo, November, 2012  / in improved, EF 60%, echo, December, 2012  . Contrast media allergy     Patient feels poorly with contrast  . Status post AAA (abdominal aortic aneurysm) repair     Surgical repair, Dr. Hart RochesterLawson, December 27, 2011  . Pre-syncope     vasovagal w/ heart block  . Parotid mass     has refused further eval 10/2011  . GERD (gastroesophageal reflux disease)   . Anxiety   . Facial tic     L sided spasms - Botox trial summer 2013, ?effective  . Macular degeneration   . AAA (abdominal aortic aneurysm)     s/p repair 12/2011  . Congestive heart failure     NL EF 10/2011 echo   . Diabetes mellitus type II, controlled   . Hypothyroidism   . Sinus bradycardia 09/19/2011    Occurring simultaneously with complete heart block   . Hypertension   . COPD (chronic obstructive pulmonary disease)   . Facial spasm      L hemifacial-treated w/ botox xeomin -Dr Terrace ArabiaYan   Past Surgical History  Procedure Laterality Date  . Cholecystectomy    . Knee cartilage surgery      left  . Sinus surgery with instatrak    . Appendectomy    . Oophorectomy      ovarian cyst  . Cataract extraction      bilateral  . Bladder repair    . [place] echocardiography  10/14/11  . Abdominal aortic aneurysm repair  12/27/2011    Procedure: ANEURYSM ABDOMINAL AORTIC REPAIR;  Surgeon: Josephina GipJames Lawson, MD;  Location: [facility] OR;  Service: Vascular;  Laterality: N/A;  Resection and Grafting Abdominal Aortic Aneurysm , Aorta Bi Iliac.  Marland Kitchen. Pacemaker insertion  11/12  . Cardiac catheterization  11/04/11   Family History  Problem Relation Age of Onset  . Esophageal cancer Father   . Cancer Father   . Breast cancer Sister   . Cancer Sister   . Colon cancer Sister   . Heart disease Sister   . Heart disease Mother   . Heart disease Son    History  Substance Use Topics  . Smoking status: Former Smoker -- 1.00 packs/day for 60 years    Types: Cigarettes    Quit  date: 08/31/2011  . Smokeless tobacco: Never Used  . Alcohol Use: No   OB History   Grav Para Term Preterm Abortions TAB SAB Ect Mult Living                 Review of Systems  Constitutional: Negative for fever.  Respiratory: Positive for cough. Negative for shortness of breath.   Cardiovascular: Negative for chest pain.  Gastrointestinal: Negative for nausea, vomiting, abdominal pain and diarrhea.  All other systems reviewed and are negative.     Allergies  Bactrim; Contrast media; Gadolinium derivatives; Iohexol; Penicillins; Sulfa antibiotics; Avelox; Ciprofloxacin; Delsym; and Hydromet  Home Medications   Prior to Admission medications   Medication Sig Start Date End Date Taking? Authorizing Provider  acetaminophen (TYLENOL) 500 MG tablet Take 500 mg by mouth every 6 (six) hours as needed (pain).    Historical Provider, MD  albuterol (PROVENTIL HFA;VENTOLIN HFA)  108 (90 BASE) MCG/ACT inhaler Inhale 2 puffs into the lungs every 6 (six) hours as needed for wheezing or shortness of breath. 03/24/14   Newt Lukes, MD  albuterol-ipratropium (COMBIVENT) 18-103 MCG/ACT inhaler Inhale 2 puffs into the lungs every 6 (six) hours as needed for wheezing or shortness of breath.    Historical Provider, MD  ALPRAZolam Prudy Feeler) 0.5 MG tablet Take 1 tablet (0.5 mg total) by mouth 3 (three) times daily. 12/17/13   Newt Lukes, MD  aspirin (ECOTRIN LOW STRENGTH) 81 MG EC tablet Take 1 tablet (81 mg total) by mouth daily. Swallow whole. 02/02/14   Pecola Lawless, MD  baclofen (LIORESAL) 10 MG tablet Take 1 tablet (10 mg total) by mouth 3 (three) times daily. 03/24/14   Newt Lukes, MD  benzonatate (TESSALON) 200 MG capsule TAKE 1 CAPSULE BY MOUTH AT BEDTIME AS NEEDED FOR COUGH. 03/29/14   Newt Lukes, MD  calcium carbonate (OS-CAL) 600 MG TABS Take 600 mg by mouth every other day.     Historical Provider, MD  carvedilol (COREG) 6.25 MG tablet Take 6.25 mg by mouth 2 (two) times daily with a meal.    Historical Provider, MD  carvedilol (COREG) 6.25 MG tablet TAKE 1 TABLET BY MOUTH 2 TIMES DAILY WITH A MEAL.    Newt Lukes, MD  cetirizine (ZYRTEC) 10 MG tablet Take 10 mg by mouth daily as needed for allergies.    Historical Provider, MD  Cholecalciferol (VITAMIN D3) 5000 UNITS TABS Take 5,000 Units by mouth every other day.     Historical Provider, MD  furosemide (LASIX) 20 MG tablet Take 20 mg by mouth daily.     Historical Provider, MD  gabapentin (NEURONTIN) 100 MG capsule Take 200 mg by mouth at bedtime.  11/29/13   Newt Lukes, MD  levothyroxine (SYNTHROID, LEVOTHROID) 175 MCG tablet Take 1 tablet (175 mcg total) by mouth daily before breakfast. 02/03/14:1 daily EXCEPT 1/2 on Tues & Thurs; check TSH 10 weeks 02/03/14   Pecola Lawless, MD  metFORMIN (GLUCOPHAGE-XR) 500 MG 24 hr tablet Take 250 mg by mouth at bedtime. 12/08/13   Newt Lukes, MD  mometasone (NASONEX) 50 MCG/ACT nasal spray Place 2 sprays into the nose daily as needed (allergies).    Historical Provider, MD  ondansetron (ZOFRAN) 4 MG tablet Take 4 mg by mouth every 8 (eight) hours as needed for nausea or vomiting.    Historical Provider, MD  pantoprazole (PROTONIX) 40 MG tablet Take 40 mg by mouth daily.    Historical Provider,  MD  Polyethyl Glycol-Propyl Glycol (SYSTANE) 0.4-0.3 % SOLN Apply 1 drop to eye 2 (two) times daily as needed (dry eyes).    Historical Provider, MD  potassium chloride SA (K-DUR,KLOR-CON) 20 MEQ tablet Take 1 tablet by mouth  daily    Newt Lukes, MD  sertraline (ZOLOFT) 50 MG tablet Take 1 tablet (50 mg total) by mouth daily. 1/2 qd 02/02/14   Pecola Lawless, MD   BP 181/76  Pulse 74  Temp(Src) 99 F (37.2 C) (Oral)  Resp 20  SpO2 94% Physical Exam  Nursing note and vitals reviewed. Constitutional: She is oriented to person, place, and time. She appears well-developed and well-nourished. No distress.  HENT:  Head: Normocephalic and atraumatic.  Mouth/Throat: Oropharynx is clear and moist.  No oral swelling or TTP.   No facial swelling, appreciable spasm, or TTP.  No Trismus.  No erythema.  No stridor.    Eyes: Conjunctivae are normal. Pupils are equal, round, and reactive to light. No scleral icterus.  Neck: Neck supple.  Cardiovascular: Normal rate, regular rhythm, normal heart sounds and intact distal pulses.   No murmur heard. Pulmonary/Chest: Effort normal and breath sounds normal. No stridor. No respiratory distress. She has no rales.  Abdominal: Soft. Bowel sounds are normal. She exhibits no distension. There is no tenderness.  Musculoskeletal: Normal range of motion.  Neurological: She is alert and oriented to person, place, and time.  Skin: Skin is warm and dry. No rash noted.  Psychiatric: She has a normal mood and affect. Her behavior is normal.    ED Course  Procedures (including critical care  time) Labs Review Labs Reviewed  CBC WITH DIFFERENTIAL - Abnormal; Notable for the following:    WBC 11.3 (*)    RBC 3.57 (*)    Hemoglobin 11.9 (*)    MCV 102.2 (*)    Neutro Abs 8.2 (*)    All other components within normal limits  BASIC METABOLIC PANEL - Abnormal; Notable for the following:    Glucose, Bld 124 (*)    GFR calc non Af Amer 64 (*)    GFR calc Af Amer 75 (*)    All other components within normal limits  URINALYSIS, ROUTINE W REFLEX MICROSCOPIC - Abnormal; Notable for the following:    APPearance CLOUDY (*)    Hgb urine dipstick SMALL (*)    Leukocytes, UA LARGE (*)    All other components within normal limits  URINE MICROSCOPIC-ADD ON - Abnormal; Notable for the following:    Bacteria, UA FEW (*)    All other components within normal limits    Imaging Review Dg Chest 2 View  04/07/2014   CLINICAL DATA:  Cough and shortness of breath  EXAM: CHEST  2 VIEW  COMPARISON:  03/22/2014  FINDINGS: Cardiac shadow remains enlarged. The lungs are well aerated bilaterally. Small left-sided pleural effusion is seen. No focal confluent infiltrate is noted. No bony abnormality is seen.  IMPRESSION: Small left pleural effusion.  No other focal abnormality is noted.   Electronically Signed   By: Alcide Clever M.D.   On: 04/07/2014 10:04  All radiology studies independently viewed by me.      EKG Interpretation   Date/Time:  Thursday Apr 07 2014 09:03:29 EDT Ventricular Rate:  72 PR Interval:  244 QRS Duration: 85 QT Interval:  458 QTC Calculation: 501 R Axis:   54 Text Interpretation:  Sinus rhythm Prolonged PR interval Prolonged QT  interval No significant change was  found Confirmed by [facility]  MD, TREY  ([id number]) on 04/07/2014 10:14:30 AM      MDM   Final diagnoses:  Facial spasm  UTI (urinary tract infection)    80 yo female who presented with chief complaint of a pulling sensation in her left face.  History was very difficult to obtain, but with the aid of chart  review, it was clear that this is a chronic problem for the patient.  I asked multiple times, but was unable to determine whether there was any change in her symptoms today that convinced her to come to the emergency room.  She did mention that she just did not feel well.  I did not appreciate facial weakness or other neurologic deficits consistent with CVA or TIA.  She had no trismus or signs of dental abscess or deep space infection.    She endorsed a cough and some urinary symptoms, so a basic workup was performed.  It showed a small pleural effusion similar to prior and pyuria.  She was subsequently started on keflex for UTI.  She remained well appearing and nontoxic throughout her ED course. She appeared stable and appropriate for further outpatient management.      Candyce Churn III, MD 04/08/14 [phone]

## 2014-04-07 NOTE — Telephone Encounter (Signed)
Spouse calling and stated patient is experiencing a headache and not feeling well.  Please return call

## 2014-04-07 NOTE — Discharge Instructions (Signed)
Urinary Tract Infection  Urinary tract infections (UTIs) can develop anywhere along your urinary tract. Your urinary tract is your body's drainage system for removing wastes and extra water. Your urinary tract includes two kidneys, two ureters, a bladder, and a urethra. Your kidneys are a pair of bean-shaped organs. Each kidney is about the size of your fist. They are located below your ribs, one on each side of your spine.  CAUSES  Infections are caused by microbes, which are microscopic organisms, including fungi, viruses, and bacteria. These organisms are so small that they can only be seen through a microscope. Bacteria are the microbes that most commonly cause UTIs.  SYMPTOMS   Symptoms of UTIs may vary by age and gender of the patient and by the location of the infection. Symptoms in young women typically include a frequent and intense urge to urinate and a painful, burning feeling in the bladder or urethra during urination. Older women and men are more likely to be tired, shaky, and weak and have muscle aches and abdominal pain. A fever may mean the infection is in your kidneys. Other symptoms of a kidney infection include pain in your back or sides below the ribs, nausea, and vomiting.  DIAGNOSIS  To diagnose a UTI, your caregiver will ask you about your symptoms. Your caregiver also will ask to provide a urine sample. The urine sample will be tested for bacteria and white blood cells. White blood cells are made by your body to help fight infection.  TREATMENT   Typically, UTIs can be treated with medication. Because most UTIs are caused by a bacterial infection, they usually can be treated with the use of antibiotics. The choice of antibiotic and length of treatment depend on your symptoms and the type of bacteria causing your infection.  HOME CARE INSTRUCTIONS   If you were prescribed antibiotics, take them exactly as your caregiver instructs you. Finish the medication even if you feel better after you  have only taken some of the medication.   Drink enough water and fluids to keep your urine clear or pale yellow.   Avoid caffeine, tea, and carbonated beverages. They tend to irritate your bladder.   Empty your bladder often. Avoid holding urine for long periods of time.   Empty your bladder before and after sexual intercourse.   After a bowel movement, women should cleanse from front to back. Use each tissue only once.  SEEK MEDICAL CARE IF:    You have back pain.   You develop a fever.   Your symptoms do not begin to resolve within 3 days.  SEEK IMMEDIATE MEDICAL CARE IF:    You have severe back pain or lower abdominal pain.   You develop chills.   You have nausea or vomiting.   You have continued burning or discomfort with urination.  MAKE SURE YOU:    Understand these instructions.   Will watch your condition.   Will get help right away if you are not doing well or get worse.  Document Released: 08/07/2005 Document Revised: 04/28/2012 Document Reviewed: 12/06/2011  [employer] Patient Information 2014 [employer].

## 2014-04-07 NOTE — ED Notes (Signed)
Pt arrived from home with EMS. Pt has been having botox for the past 3 years and has been having additional pain (numbness/discomfort) in her neck for the past year. Pt this am had some nausea and general weakness. Pt had her primary MD appt, but called EMS to be seen instead. No distress noted. Negative for stroke.

## 2014-04-08 ENCOUNTER — Ambulatory Visit: Payer: Medicare Other | Admitting: Physical Therapy

## 2014-04-08 NOTE — Telephone Encounter (Signed)
Dr. Vickey Huger this is just a FYI. Paula [name]'s spouse called yesterday evenging @ 4pm. He ended of taking her to ER, which she was treated with UTI.

## 2014-04-09 ENCOUNTER — Emergency Department (INDEPENDENT_AMBULATORY_CARE_PROVIDER_SITE_OTHER)
Admission: EM | Admit: 2014-04-09 | Discharge: 2014-04-09 | Disposition: A | Discharge status: Home / Self Care | Payer: Medicare Other | Source: Home / Self Care | Attending: Family Medicine | Admitting: Family Medicine

## 2014-04-09 ENCOUNTER — Encounter (HOSPITAL_COMMUNITY): Payer: Self-pay | Admitting: Emergency Medicine

## 2014-04-09 DIAGNOSIS — R4189 Other symptoms and signs involving cognitive functions and awareness: Secondary | ICD-10-CM

## 2014-04-09 DIAGNOSIS — F09 Unspecified mental disorder due to known physiological condition: Secondary | ICD-10-CM

## 2014-04-09 LAB — POCT I-STAT, CHEM 8
BUN: 16 mg/dL (ref 6–23)
Calcium, Ion: 1.37 mmol/L — ABNORMAL HIGH (ref 1.13–1.30)
Chloride: 104 mEq/L (ref 96–112)
Creatinine, Ser: 1.1 mg/dL (ref 0.50–1.10)
GLUCOSE: 111 mg/dL — AB (ref 70–99)
HCT: 39 % (ref 36.0–46.0)
HEMOGLOBIN: 13.3 g/dL (ref 12.0–15.0)
Potassium: 4.7 mEq/L (ref 3.7–5.3)
SODIUM: 145 meq/L (ref 137–147)
TCO2: 27 mmol/L (ref 0–100)

## 2014-04-09 LAB — POCT URINALYSIS DIP (DEVICE)
Bilirubin Urine: NEGATIVE
Glucose, UA: NEGATIVE mg/dL
Hgb urine dipstick: NEGATIVE
Ketones, ur: NEGATIVE mg/dL
Leukocytes, UA: NEGATIVE
Nitrite: NEGATIVE
PROTEIN: 30 mg/dL — AB
Urobilinogen, UA: 0.2 mg/dL (ref 0.0–1.0)
pH: 5.5 (ref 5.0–8.0)

## 2014-04-09 MED ORDER — ONDANSETRON 4 MG PO TBDP
4.0000 mg | ORAL_TABLET | Freq: Once | ORAL | Status: AC
  Administered 2014-04-09: 4 mg via ORAL

## 2014-04-09 MED ORDER — ONDANSETRON 4 MG PO TBDP
ORAL_TABLET | ORAL | Status: AC
  Filled 2014-04-09: qty 1

## 2014-04-09 NOTE — ED Provider Notes (Signed)
CSN: [id number]     Arrival date & time 04/09/14  0914 History   First MD Initiated Contact with Patient 04/09/14 [phone]     Chief Complaint  Patient presents with  . Cough   (Consider location/radiation/quality/duration/timing/severity/associated sxs/prior Treatment) Patient is a 80 y.o. female presenting with cough. The history is provided by the patient and the spouse.  Cough Cough characteristics:  Non-productive and dry Severity:  Mild Onset quality:  Gradual Context: not sick contacts   Context comment:  Seen in ER 5/28, dx with uti and cough, cxr nad, pt still not feeling well., but nonspecific, in nad, alert. Associated symptoms: rhinorrhea   Associated symptoms: no fever and no shortness of breath   Associated symptoms comment:  Sl diarrhea.   Past Medical History  Diagnosis Date  . Depression   . Ejection fraction     EF 40%, echo, November, 2012  / in improved, EF 60%, echo, December, 2012  . Contrast media allergy     Patient feels poorly with contrast  . Status post AAA (abdominal aortic aneurysm) repair     Surgical repair, Dr. Hart Rochester, December 27, 2011  . Pre-syncope     vasovagal w/ heart block  . Parotid mass     has refused further eval 10/2011  . GERD (gastroesophageal reflux disease)   . Anxiety   . Facial tic     L sided spasms - Botox trial summer 2013, ?effective  . Macular degeneration   . AAA (abdominal aortic aneurysm)     s/p repair 12/2011  . Congestive heart failure     NL EF 10/2011 echo   . Diabetes mellitus type II, controlled   . Hypothyroidism   . Sinus bradycardia 09/19/2011    Occurring simultaneously with complete heart block   . Hypertension   . COPD (chronic obstructive pulmonary disease)   . Facial spasm     L hemifacial-treated w/ botox xeomin -Dr Terrace Arabia   Past Surgical History  Procedure Laterality Date  . Cholecystectomy    . Knee cartilage surgery      left  . Sinus surgery with instatrak    . Appendectomy    .  Oophorectomy      ovarian cyst  . Cataract extraction      bilateral  . Bladder repair    . US echocardiography  10/14/11  . Abdominal aortic aneurysm repair  12/27/2011    Procedure: ANEURYSM ABDOMINAL AORTIC REPAIR;  Surgeon: Josephina Gip, MD;  Location: [facility] OR;  Service: Vascular;  Laterality: N/A;  Resection and Grafting Abdominal Aortic Aneurysm , Aorta Bi Iliac.  Marland Kitchen Pacemaker insertion  11/12  . Cardiac catheterization  11/04/11   Family History  Problem Relation Age of Onset  . Esophageal cancer Father   . Cancer Father   . Breast cancer Sister   . Cancer Sister   . Colon cancer Sister   . Heart disease Sister   . Heart disease Mother   . Heart disease Son    History  Substance Use Topics  . Smoking status: Former Smoker -- 1.00 packs/day for 60 years    Types: Cigarettes    Quit date: 08/31/2011  . Smokeless tobacco: Never Used  . Alcohol Use: No   OB History   Grav Para Term Preterm Abortions TAB SAB Ect Mult Living                 Review of Systems  Constitutional: Negative.  Negative for fever.  HENT: Positive for rhinorrhea.   Respiratory: Positive for cough. Negative for shortness of breath.   Gastrointestinal: Positive for nausea and diarrhea. Negative for abdominal pain.  Genitourinary: Positive for dysuria. Negative for frequency.    Allergies  Bactrim; Contrast media; Gadolinium derivatives; Iohexol; Penicillins; Sulfa antibiotics; Avelox; Ciprofloxacin; Delsym; and Hydromet  Home Medications   Prior to Admission medications   Medication Sig Start Date End Date Taking? Authorizing Provider  acetaminophen (TYLENOL) 500 MG tablet Take 500 mg by mouth every 6 (six) hours as needed (pain).    Historical Provider, MD  albuterol (PROVENTIL HFA;VENTOLIN HFA) 108 (90 BASE) MCG/ACT inhaler Inhale 2 puffs into the lungs every 6 (six) hours as needed for wheezing or shortness of breath. 03/24/14   Newt LukesValerie A Leschber, MD  ALPRAZolam Prudy Feeler(XANAX) 0.5 MG tablet Take 1  tablet (0.5 mg total) by mouth 3 (three) times daily. 12/17/13   Newt LukesValerie A Leschber, MD  aspirin (ECOTRIN LOW STRENGTH) 81 MG EC tablet Take 1 tablet (81 mg total) by mouth daily. Swallow whole. 02/02/14   Pecola LawlessWilliam F Hopper, MD  baclofen (LIORESAL) 10 MG tablet Take 1 tablet (10 mg total) by mouth 3 (three) times daily. 03/24/14   Newt LukesValerie A Leschber, MD  benzonatate (TESSALON) 200 MG capsule TAKE 1 CAPSULE BY MOUTH AT BEDTIME AS NEEDED FOR COUGH. 03/29/14   Newt LukesValerie A Leschber, MD  calcium carbonate (OS-CAL) 600 MG TABS Take 600 mg by mouth every other day.     Historical Provider, MD  carvedilol (COREG) 6.25 MG tablet Take 6.25 mg by mouth 2 (two) times daily with a meal.    Historical Provider, MD  cephALEXin (KEFLEX) 500 MG capsule Take 1 capsule (500 mg total) by mouth 2 (two) times daily. 04/07/14   Candyce ChurnJohn David Wofford III, MD  cetirizine (ZYRTEC) 10 MG tablet Take 10 mg by mouth daily as needed for allergies.    Historical Provider, MD  Cholecalciferol (VITAMIN D3) 5000 UNITS TABS Take 5,000 Units by mouth every other day.     Historical Provider, MD  furosemide (LASIX) 20 MG tablet Take 20 mg by mouth daily.     Historical Provider, MD  gabapentin (NEURONTIN) 100 MG capsule Take 200 mg by mouth at bedtime.  11/29/13   Newt LukesValerie A Leschber, MD  levothyroxine (SYNTHROID, LEVOTHROID) 175 MCG tablet Take 1 tablet (175 mcg total) by mouth daily before breakfast. 02/03/14:1 daily EXCEPT 1/2 on Tues & Thurs; check TSH 10 weeks 02/03/14   Pecola LawlessWilliam F Hopper, MD  metFORMIN (GLUCOPHAGE-XR) 500 MG 24 hr tablet Take 250 mg by mouth at bedtime. 12/08/13   Newt LukesValerie A Leschber, MD  mometasone (NASONEX) 50 MCG/ACT nasal spray Place 2 sprays into the nose daily as needed (allergies).    Historical Provider, MD  ondansetron (ZOFRAN) 4 MG tablet Take 4 mg by mouth every 8 (eight) hours as needed for nausea or vomiting.    Historical Provider, MD  pantoprazole (PROTONIX) 40 MG tablet Take 40 mg by mouth daily.    Historical  Provider, MD  Polyethyl Glycol-Propyl Glycol (SYSTANE) 0.4-0.3 % SOLN Apply 1 drop to eye 2 (two) times daily as needed (dry eyes).    Historical Provider, MD  potassium chloride SA (K-DUR,KLOR-CON) 20 MEQ tablet Take 1 tablet by mouth  daily    Newt LukesValerie A Leschber, MD  sertraline (ZOLOFT) 50 MG tablet Take 50 mg by mouth daily.    Historical Provider, MD   BP 135/73  Pulse 75  Temp(Src) 97.4 F (36.3 C) (Oral)  Resp 12  SpO2 92% Physical Exam  Nursing note and vitals reviewed. Constitutional: She is oriented to person, place, and time. She appears well-developed and well-nourished. No distress.  HENT:  Mouth/Throat: Oropharynx is clear and moist.  Neck: Normal range of motion. Neck supple.  Cardiovascular: Normal heart sounds.   Pulmonary/Chest: Effort normal and breath sounds normal.  Abdominal: Soft. Bowel sounds are normal.  Neurological: She is alert and oriented to person, place, and time.  Skin: Skin is warm and dry.    ED Course  Procedures (including critical care time) Labs Review Labs Reviewed  POCT URINALYSIS DIP (DEVICE) - Abnormal; Notable for the following:    Protein, ur 30 (*)    All other components within normal limits  POCT I-STAT, CHEM 8 - Abnormal; Notable for the following:    Glucose, Bld 111 (*)    Calcium, Ion 1.37 (*)    All other components within normal limits   U/a neg. Imaging Review No results found.   MDM   1. Cognitive decline       Linna Hoff, MD 04/09/14 [phone]

## 2014-04-09 NOTE — Discharge Instructions (Signed)
Finish antibiotic, drink plenty of water , see your doctor next week for review of medicines.

## 2014-04-09 NOTE — ED Notes (Signed)
Pt    Seen  2  Days  Ago  Er   For  uti  And  Facial spasm    She  Was  rx  Keflex     She  Reports  stoday  A  Cough  And  Nausea          She  Appears  In no acute  Distress   Sitting upright    - somewhat  Guarded  And  Evasive  When  Asked  [facility]

## 2014-04-11 ENCOUNTER — Encounter: Payer: Self-pay | Admitting: Internal Medicine

## 2014-04-11 ENCOUNTER — Ambulatory Visit (INDEPENDENT_AMBULATORY_CARE_PROVIDER_SITE_OTHER): Payer: Medicare Other | Admitting: Internal Medicine

## 2014-04-11 ENCOUNTER — Ambulatory Visit: Payer: Medicare Other | Admitting: Physical Therapy

## 2014-04-11 VITALS — BP 140/96 | HR 73 | Temp 98.6°F | Resp 14 | Wt 173.0 lb

## 2014-04-11 DIAGNOSIS — I10 Essential (primary) hypertension: Secondary | ICD-10-CM

## 2014-04-11 DIAGNOSIS — G501 Atypical facial pain: Secondary | ICD-10-CM

## 2014-04-11 MED ORDER — LOSARTAN POTASSIUM-HCTZ 50-12.5 MG PO TABS: 1.0000 | ORAL_TABLET | Freq: Every day | ORAL | Status: DC

## 2014-04-11 NOTE — Patient Instructions (Addendum)
The most important thing at this time and is to complete the MRI. If that is negative; I will ask your insurance company if they will authorize generic Cymbalta at low dose to treat the symptoms you're having  Until that determination can be made; please try the gabapentin 100 mg every 8 hours during the day and continue the 2 pills at bedtime.  Restart losartan HCT as your symptoms did not change when this was stopped.

## 2014-04-11 NOTE — Progress Notes (Signed)
   Subjective:    Patient ID: Paula [name], female    DOB: 12/01/1926, 80 y.o.   MRN: [id number]  HPI   She is having great difficulty expressing the nature of her symptoms. When asked whether she is having pain she states it is "not real painful".  She places her hands over the  lower portion of her face bilaterally and states that her problem is "this". She states it is associated with nausea. She describes it as being "it's just there". She goes on to elaborate "everything is where it shouldn't be".  She apparently has been in the emergency room over the weekend. She's also seen the neurologist last week. An MRI is planned for 6/2.    Baclofen has not been of benefit for her symptoms   Review of Systems She did have left temporal headache several days ago which is a rare occurrence  Her husband states he has noted some facial drawing intermittently.   She was taken off an angiotensin receptor blocker in an attempt to see whether this were an atypical form of angioedema. It has made no difference.  Her blood pressure has risen off the losartan.     Objective:   Physical Exam  She appears well-nourished in no distress  Her affect is slightly flat but she becomes animated as she discusses her problem. As noted she's having difficulty verbalizing exact nature of this issue. She expresses frustration that no diagnosis has been made for her symptoms despite multiple evaluations.  There is minimal asymmetry of her smile with a slight droop  laterally on the left.  Cranial nerve exam appears grossly normal except for abnormal vision which is chronic.  She has a regular rhythm with no significant murmur.  No carotid bruits are noted  Deep tendon reflexes are normal except for reduction of the right knee.        Assessment & Plan:  #1 vague bilateral lower face symptoms; rule out central brain stroke. MRI pending  #2 hypertension  Plan: If MRI is nondiagnostic;  I would  recommend changing her sertraline to Cymbalta to see if this may have some impact on some neuropathic type pain.  The losartan will be reinitiated as her symptoms did not improve off this medication.This is especially important because of hx of AAA. BP goal = average < 135/85.

## 2014-04-11 NOTE — Progress Notes (Signed)
Pre visit review using our clinic review tool, if applicable. No additional management support is needed unless otherwise documented below in the visit note. 

## 2014-04-12 ENCOUNTER — Ambulatory Visit
Admission: RE | Admit: 2014-04-12 | Discharge: 2014-04-12 | Disposition: A | Discharge status: Home / Self Care | Payer: Medicare Other | Source: Ambulatory Visit | Attending: Neurology | Admitting: Neurology

## 2014-04-12 ENCOUNTER — Other Ambulatory Visit: Payer: Self-pay | Admitting: Neurology

## 2014-04-12 ENCOUNTER — Telehealth: Payer: Self-pay | Admitting: *Deleted

## 2014-04-12 DIAGNOSIS — F0390 Unspecified dementia without behavioral disturbance: Secondary | ICD-10-CM

## 2014-04-12 DIAGNOSIS — G5139 Clonic hemifacial spasm, unspecified: Secondary | ICD-10-CM

## 2014-04-12 NOTE — Telephone Encounter (Signed)
Called pt and spoke with [name], pt's husband, requesting pt's MRI results that was done today. I explained to the husband that as soon as the doctor reports on the MRI, that someone will be in contact with the results. Pt's husband stated that it was ok to leave message. I advised the husband that if the pt has any other problems, questions or concerns to call the office. Husband verbalized understanding.

## 2014-04-12 NOTE — Telephone Encounter (Signed)
Radiology reads on Thursday , Dr Marjory Lies is out-  Dr Pearlean Brownie will read tomorrow for [place] by request.  Please relate - I see white matter disease , severe , advanced.  I will defer to Dr.  Ronnald Ramp reading.

## 2014-04-12 NOTE — Telephone Encounter (Signed)
Please advise previous message. Thanks

## 2014-04-13 NOTE — Telephone Encounter (Signed)
I called husband and then pt about the MRI results.  Some advanced SVD, and atrophy.  96mm lesion on parotid stable.  I instructed per Dr. Oliva Bustard note to take 325mg  aspirin po daily.  They both verbalized understanding.

## 2014-04-14 ENCOUNTER — Ambulatory Visit: Payer: Medicare Other | Admitting: Physical Therapy

## 2014-04-14 NOTE — Progress Notes (Signed)
Quick Note:  I called and spoke to pt and husband regarding the MRI results on 04-13-14. ______

## 2014-04-15 ENCOUNTER — Ambulatory Visit: Payer: Medicare Other | Admitting: Family Medicine

## 2014-04-15 ENCOUNTER — Encounter: Payer: Self-pay | Admitting: Family Medicine

## 2014-04-15 ENCOUNTER — Ambulatory Visit (INDEPENDENT_AMBULATORY_CARE_PROVIDER_SITE_OTHER): Payer: Medicare Other | Admitting: Family Medicine

## 2014-04-15 VITALS — BP 128/74 | HR 84 | Temp 98.4°F | Wt 173.8 lb

## 2014-04-15 DIAGNOSIS — N390 Urinary tract infection, site not specified: Secondary | ICD-10-CM | POA: Insufficient documentation

## 2014-04-15 DIAGNOSIS — F3289 Other specified depressive episodes: Secondary | ICD-10-CM

## 2014-04-15 DIAGNOSIS — F32A Depression, unspecified: Secondary | ICD-10-CM

## 2014-04-15 DIAGNOSIS — F068 Other specified mental disorders due to known physiological condition: Secondary | ICD-10-CM

## 2014-04-15 DIAGNOSIS — E538 Deficiency of other specified B group vitamins: Secondary | ICD-10-CM

## 2014-04-15 DIAGNOSIS — G5139 Clonic hemifacial spasm, unspecified: Secondary | ICD-10-CM

## 2014-04-15 DIAGNOSIS — F0390 Unspecified dementia without behavioral disturbance: Secondary | ICD-10-CM

## 2014-04-15 DIAGNOSIS — F329 Major depressive disorder, single episode, unspecified: Secondary | ICD-10-CM

## 2014-04-15 DIAGNOSIS — G518 Other disorders of facial nerve: Secondary | ICD-10-CM

## 2014-04-15 DIAGNOSIS — E119 Type 2 diabetes mellitus without complications: Secondary | ICD-10-CM

## 2014-04-15 DIAGNOSIS — R413 Other amnesia: Secondary | ICD-10-CM

## 2014-04-15 LAB — FOLATE: Folate: 11.9 ng/mL (ref >5.9)

## 2014-04-15 LAB — VITAMIN B12: Vitamin B-12: 258 pg/mL (ref 211–911)

## 2014-04-15 MED ORDER — GABAPENTIN 100 MG PO CAPS: 100.0000 mg | ORAL_CAPSULE | ORAL | Status: DC

## 2014-04-15 MED ORDER — FLUTICASONE PROPIONATE 50 MCG/ACT NA SUSP: 1.0000 | Freq: Every day | NASAL | Status: AC

## 2014-04-15 NOTE — Assessment & Plan Note (Signed)
Chronic issue. ?of mild senile dementia vs mild cognitive impairment. Sees neurology regularly. Reviewing blood work, evidence of macrocytosis and borderline low B12 - will rpt today along with folate and MMA serum to further eval for B12 def contributing to memory impairment.

## 2014-04-15 NOTE — Patient Instructions (Addendum)
Sign release form for records from recent [facility] evaluation by Dr Presley Raddle (latest office note). Let's stop nasonex and may start flonase (cheaper). Schedule wellness exam in next 2-3 months. B12 checked today. Continue gabapentin as up to now as it seems to be helping.

## 2014-04-15 NOTE — Assessment & Plan Note (Addendum)
Unrevealing evaluation recently - including latest MRI which I reviewed with patient today. On baclofen for this, recently started on gabapentin for this which seems to be helping.  Latest on 100mg  in am, 100mg  in afternoon and 200mg  at bedtime.  Refilled this dose. Neuro has felt this is psychogenic in the past.

## 2014-04-15 NOTE — Progress Notes (Signed)
Pre visit review using our clinic review tool, if applicable. No additional management support is needed unless otherwise documented below in the visit note. 

## 2014-04-15 NOTE — Assessment & Plan Note (Addendum)
A1c adequate for her age. Continue metformin 250mg  XR daily. Lab Results  Component Value Date   HGBA1C 7.4* 02/02/2014

## 2014-04-15 NOTE — Assessment & Plan Note (Signed)
sxs resolved with keflex treatment.

## 2014-04-15 NOTE — Progress Notes (Signed)
BP 128/74  Pulse 84  Temp(Src) 98.4 F (36.9 C) (Oral)  Wt 173 lb 12 oz (78.812 kg)   CC: f/u ER visit  Subjective:    Patient ID: Paula [name], female    DOB: 02/25/1927, 80 y.o.   MRN: [id number]  HPI: Paula [name] is a 80 y.o. female presenting on 04/15/2014 for Follow-up   Complicated patient of Dr. Diamantina Monks with h/o TIA, mild cognitive impairment vs mild dementia presents today to transfer care to our office. She has recently seen mutliple providers including PCP, ER and U[facility], neurology, and had several imaging studies to evaluate L facial twitch (no pain). Latest seen by Dr. Alwyn Ren 04/11/2014 who recommended MRI and restarting losartan HCT, and considered cymbalta instead of sertraline if gabapentin did not help.  She presents today for follow up after recent ER visit with gen weakness and urinary sxs where she was found to have 40k Ecoli UTI and treated with 5d keflex course. Urinary symptoms have resolved. Prior to ER visit she did have   H/o TIA 10/2013. Neurologist is Dr. Vickey Huger. H/o hemifacial spasms since 2011 for which she gets xeomin injections by Dr Terrace Arabia with good benefit and also takes baclofen prn  [facility] went [facility]). Unsure results of testing. Memory trouble- mild fluctuations. Sometimes sees someone else in bed. Sometimes asks who husband is. Off and on for last 9-12 months.  MRI showing chronic microvascular ischemia with generalized cerebral atrophy.  Today feeling well. She is motivated to continue current outpatient PT in hopes of improving strength and not needing walker.  MRI brain: STUDY DATE: 04/12/2014  PATIENT NAME: Paula [name]  DOB: Oct 13, 1927  MRN: [id number]  ORDERING CLINICIAN: Dr Dohmeier  CLINICAL HISTORY: 85 year patient with dementia  COMPARISON FILMS: MRI Brain 11/06/2013  EXAM: MRI Brain wo  TECHNIQUE: MRI of the brain without contrast was obtained utilizing 5 mm axial slices with T1, T2, T2 flair, T2 star gradient echo and  diffusion weighted views. T1 sagittal and T2 coronal views were obtained.  CONTRAST: none  IMAGING SITE: [facility]  FINDINGS:  The brain parenchyma shows scattered periventricular, subcortical and brainstem white matter hyperintensities 2 to advanced chronic microvascular ischemia and moderate degree of generalized cerebral atrophy. Critical image shows old rib microhemorrhage in the right frontal subcortical white matter. There appears to be stable 11 mm lesion in the right parotid gland which may represent a parotid cyst versus lymph node. No other structural lesion, tumor or acute infarcts are noted.No abnormal lesions are seen on diffusion-weighted views to suggest acute ischemia. The cortical sulci, fissures and cisterns are normal in size and appearance. Lateral, third and fourth ventricle are normal in size and appearance. No extra-axial fluid collections are seen. No evidence of mass effect or midline shift. On sagittal views the posterior fossa, pituitary gland and corpus callosum are unremarkable. No evidence of intracranial hemorrhage on gradient-echo views. The orbits and their contents, paranasal sinuses and calvarium are unremarkable. Intracranial flow voids are present.  IMPRESSION: Abnormal MRI Brain showing advanced changes of chronic microvascular ischemia and generalized cerebral atrophy which appears slightly advanced compared with previous MRI dated 11/06/2013. Stable 11 mm lesion is noted in the right parotid which appears unchanged.  INTERPRETING PHYSICIAN:  Delia Heady, MD   Relevant past medical, surgical, family and social history reviewed and updated as indicated.  Allergies and medications reviewed and updated. Current Outpatient Prescriptions on File Prior to Visit  Medication Sig  .  acetaminophen (TYLENOL) 500 MG tablet Take 500 mg by mouth every 6 (six) hours as needed (pain).  Marland Kitchen. albuterol (PROVENTIL HFA;VENTOLIN HFA) 108 (90 BASE) MCG/ACT inhaler Inhale 2  puffs into the lungs every 6 (six) hours as needed for wheezing or shortness of breath.  . ALPRAZolam (XANAX) 0.5 MG tablet Take 1 tablet (0.5 mg total) by mouth 3 (three) times daily.  . benzonatate (TESSALON) 200 MG capsule TAKE 1 CAPSULE BY MOUTH AT BEDTIME AS NEEDED FOR COUGH.  . calcium carbonate (OS-CAL) 600 MG TABS Take 600 mg by mouth every other day.   . carvedilol (COREG) 6.25 MG tablet Take 6.25 mg by mouth 2 (two) times daily with a meal.  . cetirizine (ZYRTEC) 10 MG tablet Take 10 mg by mouth daily as needed for allergies.  . Cholecalciferol (VITAMIN D3) 5000 UNITS TABS Take 5,000 Units by mouth every other day.   . furosemide (LASIX) 20 MG tablet Take 20 mg by mouth daily.   Marland Kitchen. levothyroxine (SYNTHROID, LEVOTHROID) 175 MCG tablet Take 1 tablet (175 mcg total) by mouth daily before breakfast. 02/03/14:1 daily EXCEPT 1/2 on Tues & Thurs; check TSH 10 weeks  . losartan-hydrochlorothiazide (HYZAAR) 50-12.5 MG per tablet Take 1 tablet by mouth daily.  . metFORMIN (GLUCOPHAGE-XR) 500 MG 24 hr tablet Take 250 mg by mouth at bedtime.  . ondansetron (ZOFRAN) 4 MG tablet Take 4 mg by mouth every 8 (eight) hours as needed for nausea or vomiting.  . pantoprazole (PROTONIX) 40 MG tablet Take 40 mg by mouth daily.  Bertram Gala. Polyethyl Glycol-Propyl Glycol (SYSTANE) 0.4-0.3 % SOLN Apply 1 drop to eye 2 (two) times daily as needed (dry eyes).  . potassium chloride SA (K-DUR,KLOR-CON) 20 MEQ tablet Take 1 tablet by mouth  daily  . sertraline (ZOLOFT) 50 MG tablet Take 50 mg by mouth daily.   No current facility-administered medications on file prior to visit.    Review of Systems Per HPI unless specifically indicated above    Objective:    BP 128/74  Pulse 84  Temp(Src) 98.4 F (36.9 C) (Oral)  Wt 173 lb 12 oz (78.812 kg)  Physical Exam  Nursing note and vitals reviewed. Constitutional: She appears well-developed and well-nourished. No distress.  Using walker  HENT:  Mouth/Throat: Oropharynx is  clear and moist. No oropharyngeal exudate.  No facial spasm today  Neck: Carotid bruit is not present. No thyromegaly present.  Cardiovascular: Normal rate, regular rhythm, normal heart sounds and intact distal pulses.   No murmur heard. Pulmonary/Chest: Effort normal and breath sounds normal. No respiratory distress. She has no wheezes. She has no rales.  Musculoskeletal: She exhibits no edema.  Neurological: She is alert.  Person - ok Place - unsure Time - June 3rd, 2012 CN grossly intact Unsteady with ambulation without walker No cogwheel rigidity No masked fascies  Skin: Skin is warm and dry. No rash noted.  Psychiatric: She has a normal mood and affect.   Results for orders placed during the hospital encounter of 04/09/14  POCT URINALYSIS DIP (DEVICE)      Result Value Ref Range   Glucose, UA NEGATIVE  NEGATIVE mg/dL   Bilirubin Urine NEGATIVE  NEGATIVE   Ketones, ur NEGATIVE  NEGATIVE mg/dL   Specific Gravity, Urine >=1.030  1.005 - 1.030   Hgb urine dipstick NEGATIVE  NEGATIVE   pH 5.5  5.0 - 8.0   Protein, ur 30 (*) NEGATIVE mg/dL   Urobilinogen, UA 0.2  0.0 - 1.0 mg/dL  Nitrite NEGATIVE  NEGATIVE   Leukocytes, UA NEGATIVE  NEGATIVE  POCT I-STAT, CHEM 8      Result Value Ref Range   Sodium 145  137 - 147 mEq/L   Potassium 4.7  3.7 - 5.3 mEq/L   Chloride 104  96 - 112 mEq/L   BUN 16  6 - 23 mg/dL   Creatinine, Ser 3.89  0.50 - 1.10 mg/dL   Glucose, Bld 373 (*) 70 - 99 mg/dL   Calcium, Ion 4.28 (*) 1.13 - 1.30 mmol/L   TCO2 27  0 - 100 mmol/L   Hemoglobin 13.3  12.0 - 15.0 g/dL   HCT 76.8  11.5 - 72.6 %      Assessment & Plan:   Problem List Items Addressed This Visit   Type 2 diabetes, controlled, with peripheral circulatory disorder      A1c adequate for her age. Continue metformin 250mg  XR daily. Lab Results  Component Value Date   HGBA1C 7.4* 02/02/2014      Relevant Medications      aspirin 325 MG tablet   Facial spasm     Unrevealing evaluation  recently - including latest MRI which I reviewed with patient today. On baclofen for this, recently started on gabapentin for this which seems to be helping.  Latest on 100mg  in am, 100mg  in afternoon and 200mg  at bedtime.  Refilled this dose. Neuro has felt this is psychogenic in the past.    Depression     Stable, continue sertraline 50mg  daily and xanax tid prn.    Dementia arising in the senium and presenium - Primary     Chronic issue. ?of mild senile dementia vs mild cognitive impairment. Sees neurology regularly. Reviewing blood work, evidence of macrocytosis and borderline low B12 - will rpt today along with folate and MMA serum to further eval for B12 def contributing to memory impairment.    Relevant Medications      baclofen (LIORESAL) 10 MG tablet      gabapentin (NEURONTIN) capsule   Other Relevant Orders      Vitamin B12      Folate      Methylmalonic Acid, Serum    Other Visit Diagnoses   Memory deficits        Relevant Orders       Vitamin B12       Folate       Methylmalonic Acid, Serum    Low vitamin B12 level        Relevant Orders       Methylmalonic Acid, Serum    UTI (urinary tract infection)            Follow up plan: Return in about 2 months (around 06/15/2014), or if symptoms worsen or fail to improve, for annual exam, prior fasting for blood work.

## 2014-04-15 NOTE — Assessment & Plan Note (Signed)
Stable, continue sertraline 50mg  daily and xanax tid prn.

## 2014-04-18 ENCOUNTER — Ambulatory Visit: Payer: Medicare Other | Attending: Internal Medicine | Admitting: Physical Therapy

## 2014-04-18 ENCOUNTER — Other Ambulatory Visit: Payer: Self-pay | Admitting: Internal Medicine

## 2014-04-18 ENCOUNTER — Telehealth: Payer: Self-pay | Admitting: Internal Medicine

## 2014-04-18 DIAGNOSIS — IMO0001 Reserved for inherently not codable concepts without codable children: Secondary | ICD-10-CM | POA: Insufficient documentation

## 2014-04-18 DIAGNOSIS — R5381 Other malaise: Secondary | ICD-10-CM | POA: Insufficient documentation

## 2014-04-18 DIAGNOSIS — M6281 Muscle weakness (generalized): Secondary | ICD-10-CM | POA: Insufficient documentation

## 2014-04-18 DIAGNOSIS — R269 Unspecified abnormalities of gait and mobility: Secondary | ICD-10-CM | POA: Diagnosis not present

## 2014-04-18 LAB — METHYLMALONIC ACID, SERUM: Methylmalonic Acid, Quant: 256 nmol/L (ref 87–318)

## 2014-04-18 MED ORDER — DULOXETINE HCL 30 MG PO CPEP: 30.0000 mg | ORAL_CAPSULE | Freq: Every day | ORAL | Status: DC

## 2014-04-18 NOTE — Telephone Encounter (Signed)
   Please see if her insurance coverage will cover generic Cymbalta 30 mg daily to treat the neuropathic facial pain.  This will be in place on sertraline and gabapentin.

## 2014-04-18 NOTE — Telephone Encounter (Signed)
Pt called stated that Dr. Alwyn Ren was going to check with her insurance about cymbalta (generic for it) for facial pain. Pt is on gabapentin right now. Pt also wonder if Dr. Alwyn Ren want her to continue gabapentin if Cymbalta is approve. Please call pt.

## 2014-04-18 NOTE — Telephone Encounter (Signed)
Prescription for cymbalta must be submitted to pharmacy before insurance can tell if covered. If not pharmacy will send denial of coverage form then we can proceed with PA...Raechel Chute

## 2014-04-19 ENCOUNTER — Encounter: Payer: Self-pay | Admitting: *Deleted

## 2014-04-19 ENCOUNTER — Other Ambulatory Visit: Payer: Self-pay | Admitting: Family Medicine

## 2014-04-19 MED ORDER — B-12 500 MCG PO TABS: 1.0000 | ORAL_TABLET | Freq: Every day | ORAL | Status: DC

## 2014-04-20 ENCOUNTER — Telehealth: Payer: Self-pay | Admitting: Internal Medicine

## 2014-04-20 ENCOUNTER — Telehealth: Payer: Self-pay

## 2014-04-20 NOTE — Telephone Encounter (Signed)
Restart BP med ( [facility])

## 2014-04-20 NOTE — Telephone Encounter (Signed)
Ok to hold cymbalta for now, continue gabapentin. recommend she take losartan hctz she was previously taking.  BP Readings from Last 3 Encounters:  04/15/14 128/74  04/11/14 140/96  04/09/14 135/73

## 2014-04-20 NOTE — Telephone Encounter (Signed)
Pts husband said when pt saw Dr Alwyn Ren on 04/11/14 pt was to restart Losartan; pt is not sure if she is to take Losartan or not and pt wants to know if Dr Reece Agar thinks pt should restart Losartan. Mr Ausherman also said that pt is concerned about taking Cymbalta due to possible adverse reactions such as hallucinations; pt wants to continue gabapentin and not start cymbalta. Mr Ghuman request cb on 04/21/14.

## 2014-04-20 NOTE — Telephone Encounter (Signed)
[name], patient's husband, calling to ask someone to tell [place] that she needs to restart medication that Dr. Alwyn Ren told her to restart.

## 2014-04-21 ENCOUNTER — Ambulatory Visit: Payer: Medicare Other | Admitting: Physical Therapy

## 2014-04-21 ENCOUNTER — Other Ambulatory Visit: Payer: Self-pay | Admitting: Internal Medicine

## 2014-04-21 DIAGNOSIS — IMO0001 Reserved for inherently not codable concepts without codable children: Secondary | ICD-10-CM | POA: Diagnosis not present

## 2014-04-21 MED ORDER — GABAPENTIN 100 MG PO CAPS: 100.0000 mg | ORAL_CAPSULE | ORAL | Status: DC

## 2014-04-21 NOTE — Telephone Encounter (Signed)
Patient and patient's husband notified and verbalized understanding.

## 2014-04-21 NOTE — Telephone Encounter (Signed)
Notified pt with md response.../lmb 

## 2014-04-25 ENCOUNTER — Ambulatory Visit (INDEPENDENT_AMBULATORY_CARE_PROVIDER_SITE_OTHER): Payer: Medicare Other | Admitting: Family Medicine

## 2014-04-25 ENCOUNTER — Encounter: Payer: Self-pay | Admitting: Family Medicine

## 2014-04-25 VITALS — BP 136/82 | HR 78 | Temp 99.0°F | Wt 178.5 lb

## 2014-04-25 DIAGNOSIS — E538 Deficiency of other specified B group vitamins: Secondary | ICD-10-CM | POA: Insufficient documentation

## 2014-04-25 DIAGNOSIS — R05 Cough: Secondary | ICD-10-CM

## 2014-04-25 DIAGNOSIS — R059 Cough, unspecified: Secondary | ICD-10-CM | POA: Insufficient documentation

## 2014-04-25 MED ORDER — CYANOCOBALAMIN 1000 MCG/ML IJ SOLN
1000.0000 ug | Freq: Once | INTRAMUSCULAR | Status: AC
  Administered 2014-04-25: 1000 ug via INTRAMUSCULAR

## 2014-04-25 MED ORDER — FEXOFENADINE HCL 180 MG PO TABS: 180.0000 mg | ORAL_TABLET | Freq: Every day | ORAL | Status: DC

## 2014-04-25 NOTE — Progress Notes (Signed)
Pre visit review using our clinic review tool, if applicable. No additional management support is needed unless otherwise documented below in the visit note. 

## 2014-04-25 NOTE — Addendum Note (Signed)
Addended by: Josph MachoANCE, KIMBERLY A on: 04/25/2014 03:17 PM   Modules accepted: Orders

## 2014-04-25 NOTE — Assessment & Plan Note (Signed)
Borderline. B12 shot provided today. Continue oral B12 tab daily.

## 2014-04-25 NOTE — Assessment & Plan Note (Signed)
No evidence of infection currently. Anticipate allergic cough - change from zyrtec to allegra. Continue tessalon perls. Avoid cough syrups given reactions in past. Pt very sensitive to meds.

## 2014-04-25 NOTE — Patient Instructions (Addendum)
B12 shot today. Lungs sound clear today. Continue tessalon perls. Continue [facility] brand cough syrup. Let's change from zyrtec to allegra one pill daily for allergies which may be contributing to cough. Push fluids and rest.

## 2014-04-25 NOTE — Addendum Note (Signed)
Addended by: Josph MachoANCE, KIMBERLY A on: 04/25/2014 05:05 PM   Modules accepted: Orders

## 2014-04-25 NOTE — Progress Notes (Signed)
BP 136/82  Pulse 78  Temp(Src) 99 F (37.2 C) (Oral)  Wt 178 lb 8 oz (80.967 kg)  SpO2 95%   CC: "I have a cold" Subjective:    Patient ID: Paula [name], female    DOB: 09-Feb-1927, 80 y.o.   MRN: [id number]  HPI: Paula [name] is a 80 y.o. female presenting on 04/25/2014 for Cough   Worse cough at night time that started 3-4 days ago. Cough is dry. Occasional wheezing. Mild rhinorrhea with PNdrainage.  No fevers/chills, ear or tooth pain, headaches, abd pain, PNdraingae or sore throat, dyspnea. No GERD sxs. So far has tried cough drops and OTC cough syrup. Takes flonase intermittently, zyrtec daily in am H/o COPD in past.  H/o PNA in past. No sick contacts at home. No smokers at home. No h/o asthma.  Delsym and hydromet cause hallucinations.  Relevant past medical, surgical, family and social history reviewed and updated as indicated.  Allergies and medications reviewed and updated. Current Outpatient Prescriptions on File Prior to Visit  Medication Sig  . acetaminophen (TYLENOL) 500 MG tablet Take 500 mg by mouth every 6 (six) hours as needed (pain).  Marland Kitchen. albuterol (PROVENTIL HFA;VENTOLIN HFA) 108 (90 BASE) MCG/ACT inhaler Inhale 2 puffs into the lungs every 6 (six) hours as needed for wheezing or shortness of breath.  . ALPRAZolam (XANAX) 0.5 MG tablet Take 1 tablet (0.5 mg total) by mouth 3 (three) times daily.  Marland Kitchen. aspirin 325 MG tablet Take 325 mg by mouth daily.  . baclofen (LIORESAL) 10 MG tablet Take 10 mg by mouth 3 (three) times daily as needed.  . benzonatate (TESSALON) 200 MG capsule TAKE 1 CAPSULE BY MOUTH AT BEDTIME AS NEEDED FOR COUGH.  . calcium carbonate (OS-CAL) 600 MG TABS Take 600 mg by mouth every other day.   . carvedilol (COREG) 6.25 MG tablet Take 6.25 mg by mouth 2 (two) times daily with a meal.  . cetirizine (ZYRTEC) 10 MG tablet Take 10 mg by mouth daily as needed for allergies.  . Cholecalciferol (VITAMIN D3) 5000 UNITS TABS Take 5,000 Units by  mouth every other day.   . Cyanocobalamin (B-12) 500 MCG TABS Take 1 tablet by mouth daily.  . fluticasone (FLONASE) 50 MCG/ACT nasal spray Place 1 spray into both nostrils daily.  . furosemide (LASIX) 20 MG tablet Take 20 mg by mouth daily.   Marland Kitchen. gabapentin (NEURONTIN) 100 MG capsule Take 1 capsule (100 mg total) by mouth as directed. 1 in am 1 in afternoon and 2 at night  . levothyroxine (SYNTHROID, LEVOTHROID) 175 MCG tablet Take 1 tablet (175 mcg total) by mouth daily before breakfast. 02/03/14:1 daily EXCEPT 1/2 on Tues & Thurs; check TSH 10 weeks  . losartan-hydrochlorothiazide (HYZAAR) 50-12.5 MG per tablet Take 1 tablet by mouth daily.  . metFORMIN (GLUCOPHAGE-XR) 500 MG 24 hr tablet Take 250 mg by mouth at bedtime.  . ondansetron (ZOFRAN) 4 MG tablet Take 4 mg by mouth every 8 (eight) hours as needed for nausea or vomiting.  . pantoprazole (PROTONIX) 40 MG tablet Take 40 mg by mouth daily.  Bertram Gala. Polyethyl Glycol-Propyl Glycol (SYSTANE) 0.4-0.3 % SOLN Apply 1 drop to eye 2 (two) times daily as needed (dry eyes).  . potassium chloride SA (K-DUR,KLOR-CON) 20 MEQ tablet Take 1 tablet by mouth  daily  . DULoxetine (CYMBALTA) 30 MG capsule Take 1 capsule (30 mg total) by mouth daily.   No current facility-administered medications on file prior to visit.  Review of Systems Per HPI unless specifically indicated above    Objective:    BP 136/82  Pulse 78  Temp(Src) 99 F (37.2 C) (Oral)  Wt 178 lb 8 oz (80.967 kg)  SpO2 95%  Physical Exam  Nursing note and vitals reviewed. Constitutional: She appears well-developed and well-nourished. No distress.  HENT:  Head: Normocephalic and atraumatic.  Right Ear: Hearing, tympanic membrane, external ear and ear canal normal.  Left Ear: Hearing, tympanic membrane, external ear and ear canal normal.  Nose: No mucosal edema or rhinorrhea. Right sinus exhibits no maxillary sinus tenderness and no frontal sinus tenderness. Left sinus exhibits no  maxillary sinus tenderness and no frontal sinus tenderness.  Mouth/Throat: Uvula is midline, oropharynx is clear and moist and mucous membranes are normal. No oropharyngeal exudate, posterior oropharyngeal edema, posterior oropharyngeal erythema or tonsillar abscesses.  Eyes: Conjunctivae and EOM are normal. Pupils are equal, round, and reactive to light. No scleral icterus.  Neck: Normal range of motion. Neck supple.  Cardiovascular: Normal rate, regular rhythm, normal heart sounds and intact distal pulses.   No murmur heard. Pulmonary/Chest: Effort normal and breath sounds normal. No respiratory distress. She has no wheezes. She has no rales.  Lymphadenopathy:    She has no cervical adenopathy.  Skin: Skin is warm and dry. No rash noted.   Results for orders placed in visit on 04/15/14  VITAMIN B12      Result Value Ref Range   Vitamin B-12 258  211 - 911 pg/mL  FOLATE      Result Value Ref Range   Folate 11.9  >5.9 ng/mL  METHYLMALONIC ACID, SERUM      Result Value Ref Range   Methylmalonic Acid, Quant 256  87 - 318 nmol/L      Assessment & Plan:   Problem List Items Addressed This Visit   Cough - Primary     No evidence of infection currently. Anticipate allergic cough - change from zyrtec to allegra. Continue tessalon perls. Avoid cough syrups given reactions in past. Pt very sensitive to meds.    B12 deficiency     Borderline. B12 shot provided today. Continue oral B12 500mcg tab daily.        Follow up plan: Return if symptoms worsen or fail to improve.

## 2014-04-27 ENCOUNTER — Encounter (HOSPITAL_COMMUNITY): Payer: Self-pay | Admitting: Emergency Medicine

## 2014-04-27 ENCOUNTER — Emergency Department (HOSPITAL_COMMUNITY): Payer: Medicare Other

## 2014-04-27 ENCOUNTER — Emergency Department (HOSPITAL_COMMUNITY)
Admission: EM | Admit: 2014-04-27 | Discharge: 2014-04-27 | Disposition: A | Discharge status: Home / Self Care | Payer: Medicare Other | Attending: Emergency Medicine | Admitting: Emergency Medicine

## 2014-04-27 DIAGNOSIS — J449 Chronic obstructive pulmonary disease, unspecified: Secondary | ICD-10-CM | POA: Insufficient documentation

## 2014-04-27 DIAGNOSIS — F3289 Other specified depressive episodes: Secondary | ICD-10-CM | POA: Insufficient documentation

## 2014-04-27 DIAGNOSIS — Z87891 Personal history of nicotine dependence: Secondary | ICD-10-CM | POA: Insufficient documentation

## 2014-04-27 DIAGNOSIS — K219 Gastro-esophageal reflux disease without esophagitis: Secondary | ICD-10-CM | POA: Insufficient documentation

## 2014-04-27 DIAGNOSIS — Z880 Allergy status to penicillin: Secondary | ICD-10-CM | POA: Insufficient documentation

## 2014-04-27 DIAGNOSIS — J4489 Other specified chronic obstructive pulmonary disease: Secondary | ICD-10-CM | POA: Insufficient documentation

## 2014-04-27 DIAGNOSIS — I10 Essential (primary) hypertension: Secondary | ICD-10-CM | POA: Insufficient documentation

## 2014-04-27 DIAGNOSIS — Z7982 Long term (current) use of aspirin: Secondary | ICD-10-CM | POA: Insufficient documentation

## 2014-04-27 DIAGNOSIS — Z79899 Other long term (current) drug therapy: Secondary | ICD-10-CM | POA: Insufficient documentation

## 2014-04-27 DIAGNOSIS — F411 Generalized anxiety disorder: Secondary | ICD-10-CM | POA: Insufficient documentation

## 2014-04-27 DIAGNOSIS — Z9889 Other specified postprocedural states: Secondary | ICD-10-CM | POA: Insufficient documentation

## 2014-04-27 DIAGNOSIS — F0390 Unspecified dementia without behavioral disturbance: Secondary | ICD-10-CM | POA: Insufficient documentation

## 2014-04-27 DIAGNOSIS — E119 Type 2 diabetes mellitus without complications: Secondary | ICD-10-CM | POA: Insufficient documentation

## 2014-04-27 DIAGNOSIS — I509 Heart failure, unspecified: Secondary | ICD-10-CM | POA: Insufficient documentation

## 2014-04-27 DIAGNOSIS — F329 Major depressive disorder, single episode, unspecified: Secondary | ICD-10-CM | POA: Insufficient documentation

## 2014-04-27 HISTORY — DX: Unspecified dementia, unspecified severity, without behavioral disturbance, psychotic disturbance, mood disturbance, and anxiety: F03.90

## 2014-04-27 LAB — URINALYSIS, ROUTINE W REFLEX MICROSCOPIC
BILIRUBIN URINE: NEGATIVE
Glucose, UA: 100 mg/dL — AB
Hgb urine dipstick: NEGATIVE
Ketones, ur: NEGATIVE mg/dL
Nitrite: NEGATIVE
Protein, ur: NEGATIVE mg/dL
SPECIFIC GRAVITY, URINE: 1.025 (ref 1.005–1.030)
UROBILINOGEN UA: 0.2 mg/dL (ref 0.0–1.0)
pH: 5.5 (ref 5.0–8.0)

## 2014-04-27 LAB — CBC WITH DIFFERENTIAL/PLATELET
Basophils Absolute: 0.1 10*3/uL (ref 0.0–0.1)
Basophils Relative: 0 % (ref 0–1)
Eosinophils Absolute: 0.3 10*3/uL (ref 0.0–0.7)
Eosinophils Relative: 2 % (ref 0–5)
HCT: 36.1 % (ref 36.0–46.0)
HEMOGLOBIN: 11.7 g/dL — AB (ref 12.0–15.0)
LYMPHS ABS: 2.9 10*3/uL (ref 0.7–4.0)
Lymphocytes Relative: 20 % (ref 12–46)
MCH: 33.1 pg (ref 26.0–34.0)
MCHC: 32.4 g/dL (ref 30.0–36.0)
MCV: 102.3 fL — ABNORMAL HIGH (ref 78.0–100.0)
MONOS PCT: 5 % (ref 3–12)
Monocytes Absolute: 0.8 10*3/uL (ref 0.1–1.0)
NEUTROS PCT: 73 % (ref 43–77)
Neutro Abs: 10.3 10*3/uL — ABNORMAL HIGH (ref 1.7–7.7)
PLATELETS: 343 10*3/uL (ref 150–400)
RBC: 3.53 MIL/uL — AB (ref 3.87–5.11)
RDW: 14.6 % (ref 11.5–15.5)
WBC: 14.4 10*3/uL — AB (ref 4.0–10.5)

## 2014-04-27 LAB — COMPREHENSIVE METABOLIC PANEL
ALK PHOS: 51 U/L (ref 39–117)
ALT: 20 U/L (ref 0–35)
AST: 20 U/L (ref 0–37)
Albumin: 4.1 g/dL (ref 3.5–5.2)
BUN: 19 mg/dL (ref 6–23)
CO2: 24 meq/L (ref 19–32)
Calcium: 10.1 mg/dL (ref 8.4–10.5)
Chloride: 103 mEq/L (ref 96–112)
Creatinine, Ser: 0.85 mg/dL (ref 0.50–1.10)
GFR, EST AFRICAN AMERICAN: 70 mL/min — AB (ref >90)
GFR, EST NON AFRICAN AMERICAN: 61 mL/min — AB (ref >90)
GLUCOSE: 153 mg/dL — AB (ref 70–99)
POTASSIUM: 3.7 meq/L (ref 3.7–5.3)
SODIUM: 142 meq/L (ref 137–147)
Total Bilirubin: 0.4 mg/dL (ref 0.3–1.2)
Total Protein: 6.9 g/dL (ref 6.0–8.3)

## 2014-04-27 LAB — URINE MICROSCOPIC-ADD ON

## 2014-04-27 LAB — TROPONIN I: Troponin I: <0.3 ng/mL (ref <0.30)

## 2014-04-27 NOTE — ED Notes (Signed)
Pt assisted to bathroom to void.  Denies further needs/complaints.  Remains awaiting case management.

## 2014-04-27 NOTE — Discharge Instructions (Signed)
Dementia Dementia is a general term for problems with brain function. A person with dementia has memory loss and a hard time with at least one other brain function such as thinking, speaking, or problem solving. Dementia can affect social functioning, how you do your job, your mood, or your personality. The changes may be hidden for a long time. The earliest forms of this disease are usually not detected by family or friends. Dementia can be:  Irreversible.  Potentially reversible.  Partially reversible.  Progressive. This means it can get worse over time. CAUSES  Irreversible dementia causes may include:  Degeneration of brain cells (Alzheimer's disease or lewy body dementia).  Multiple small strokes (vascular dementia).  Infection (chronic meningitis or Creutzfelt-Jakob disease).  Frontotemporal dementia. This affects younger people, age 40 to 70, compared to those who have Alzheimer's disease.  Dementia associated with other disorders like Parkinson's disease, Huntington's disease, or HIV-associated dementia. Potentially or partially reversible dementia causes may include:  Medicines.  Metabolic causes such as excessive alcohol intake, vitamin B12 deficiency, or thyroid disease.  Masses or pressure in the brain such as a tumor, blood clot, or hydrocephalus. SYMPTOMS  Symptoms are often hard to detect. Family members or coworkers may not notice them early in the disease process. Different people with dementia may have different symptoms. Symptoms can include:  A hard time with memory, especially recent memory. Long-term memory may not be impaired.  Asking the same question multiple times or forgetting something someone just said.  A hard time speaking your thoughts or finding certain words.  A hard time solving problems or performing familiar tasks (such as how to use a telephone).  Sudden changes in mood.  Changes in personality, especially increasing moodiness or  mistrust.  Depression.  A hard time understanding complex ideas that were never a problem in the past. DIAGNOSIS  There are no specific tests for dementia.   Your caregiver may recommend a thorough evaluation. This is because some forms of dementia can be reversible. The evaluation will likely include a physical exam and getting a detailed history from you and a family member. The history often gives the best clues and suggestions for a diagnosis.  Memory testing may be done. A detailed brain function evaluation called neuropsychologic testing may be helpful.  Lab tests and brain imaging (such as a CT scan or MRI scan) are sometimes important.  Sometimes observation and re-evaluation over time is very helpful. TREATMENT  Treatment depends on the cause.   If the problem is a vitamin deficiency, it may be helped or cured with supplements.  For dementias such as Alzheimer's disease, medicines are available to stabilize or slow the course of the disease. There are no cures for this type of dementia.  Your caregiver can help direct you to groups, organizations, and other caregivers to help with decisions in the care of you or your loved one. HOME CARE INSTRUCTIONS The care of individuals with dementia is varied and dependent upon the progression of the dementia. The following suggestions are intended for the person living with, or caring for, the person with dementia.  Create a safe environment.  Remove the locks on bathroom doors to prevent the person from accidentally locking himself or herself in.  Use childproof latches on kitchen cabinets and any place where cleaning supplies, chemicals, or alcohol are kept.  Use childproof covers in unused electrical outlets.  Install childproof devices to keep doors and windows secured.  Remove stove knobs or install safety   knobs and an automatic shut-off on the stove.  Lower the temperature on water heaters.  Label medicines and keep them  locked up.  Secure knives, lighters, matches, power tools, and guns, and keep these items out of reach.  Keep the house free from clutter. Remove rugs or anything that might contribute to a fall.  Remove objects that might break and hurt the person.  Make sure lighting is good, both inside and outside.  Install grab rails as needed.  Use a monitoring device to alert you to falls or other needs for help.  Reduce confusion.  Keep familiar objects and people around.  Use night lights or dim lights at night.  Label items or areas.  Use reminders, notes, or directions for daily activities or tasks.  Keep a simple, consistent routine for waking, meals, bathing, dressing, and bedtime.  Create a calm, quiet environment.  Place large clocks and calendars prominently.  Display emergency numbers and home address near all telephones.  Use cues to establish different times of the day. An example is to open curtains to let the natural light in during the day.   Use effective communication.  Choose simple words and short sentences.  Use a gentle, calm tone of voice.  Be careful not to interrupt.  If the person is struggling to find a word or communicate a thought, try to provide the word or thought.  Ask one question at a time. Allow the person ample time to answer questions. Repeat the question again if the person does not respond.  Reduce nighttime restlessness.  Provide a comfortable bed.  Have a consistent nighttime routine.  Ensure a regular walking or physical activity schedule. Involve the person in daily activities as much as possible.  Limit napping during the day.  Limit caffeine.  Attend social events that stimulate rather than overwhelm the senses.  Encourage good nutrition and hydration.  Reduce distractions during meal times and snacks.  Avoid foods that are too hot or too cold.  Monitor chewing and swallowing ability.  Continue with routine vision,  hearing, dental, and medical screenings.  Only give over-the-counter or prescription medicines as directed by the caregiver.  Monitor driving abilities. Do not allow the person to drive when safe driving is no longer possible.  Register with an identification program which could provide location assistance in the event of a missing person situation. SEEK MEDICAL CARE IF:   New behavioral problems start such as moodiness, aggressiveness, or seeing things that are not there (hallucinations).  Any new problem with brain function happens. This includes problems with balance, speech, or falling a lot.  Problems with swallowing develop.  Any symptoms of other illness happen. Small changes or worsening in any aspect of brain function can be a sign that the illness is getting worse. It can also be a sign of another medical illness such as infection. Seeing a caregiver right away is important. SEEK IMMEDIATE MEDICAL CARE IF:   A fever develops.  New or worsened confusion develops.  New or worsened sleepiness develops.  Staying awake becomes hard to do. Document Released: 04/23/2001 Document Revised: 01/20/2012 Document Reviewed: 03/25/2011 ExitCare Patient Information 2015 [employer]. This information is not intended to replace advice given to you by your health care provider. Make sure you discuss any questions you have with your health care provider.  

## 2014-04-27 NOTE — ED Notes (Signed)
Per EMS, patient is having increased dementia symptoms-states woke up this am and didn't know who her husband is-oriented X 1-seen on 15 th by PCP, but not for current symptoms

## 2014-04-27 NOTE — ED Notes (Signed)
Initial Contact - pt sitting up in w/c, refusing to lay in bed or change into hospital gown.  Family at bedside, pt sts "that's not my husband, i don't even know who that is".  Pt denies complaints.  Rambling.  Pt with expressive aphasia noted, confused.  No other neuro deficits noted.  Speaking full sentences, rr even/un-lab.  MAEI.  Skin PWD.  Placed to cardiac/02 monitor.  NAD.

## 2014-04-27 NOTE — ED Notes (Signed)
Pt declines meal offer at this time, pt given a coke and a water. Tolerating well.  Denies further needs/complaints.

## 2014-04-27 NOTE — ED Notes (Signed)
Pt to radiology.

## 2014-04-27 NOTE — Progress Notes (Signed)
CARE MANAGEMENT ED NOTE 04/27/2014  Patient:  Paula [name],Monet R   Account Number:  [id number]  Date Initiated:  04/27/2014  Documentation initiated by:  Edd ArbourGIBBS,KIMBERLY  Subjective/Objective Assessment:   80 yr [place] with increased dementia s/s states woke up this am and didn't know who her husband is-oriented X 1-seen on 15 th by PCP, but not for current symptoms     Subjective/Objective Assessment Detail:   Initial [facility] ED Contact - pt sitting up in w/c, refusing to lay in bed or change into hospital gown.  Family at bedside, pt sts "that's not my husband, i don't even know who that is".  Pt with 7 ED visits & 1 Admission in last 6 months Last admission 11/06/13 to 11/08/13  pcp gutierrez, Brewing technologistjavier as confirmed by pt's spouse  Both pt & spouse noted to speak at the same time during Cm assessment Pt c/o cough, back pain Sees Dr Bing QuarrySolo off [address] Upcoming appt for back issues Pt recognizing spouse during assessment Both report moving to [facility] x 2 but preference now is their home with resources  Support system: Pt none locally-sister in [place]/spouse has brother in [place] & sister in [place]  States has been seen by [facility] staff, Anice Paganinionnie brown and Ms [facility] Barefoot  Pt has an old walker ('leans to far to left") Interested in a new one  [facility]  Husband cell # he provided is [phone] Request calls to cell & home numbers  Pt receives meals on wheels but husband does not qualify but able to partake in meals offered to pt  Pt admits to postponing her Lasix at intervals Report an admission after delay of 4 days of Lasix and Dr Isla Pence Thompson evaluated her  Pt [facility]ance [facility] Wants to "wait on" PDN services but states she can afford $12-18/hour costs     Action/Plan:   ED CM contacted by ED US, CM spoke with EDP, Romeo AppleHarrison, ED Cm reviewed EPIC notes, Cm spoke with pt  & spouse at bedside. Reviewed listed items below (HH, PDN, ALF, SNF, THN) [facility] & [facility] services   Action/Plan Detail:   Left Voice message for Juanda BondDebbie of [facility] at [phone] [facility] EPIC Discussed s/s for CHF to watch out for. EDP updated   Anticipated DC Date:  04/27/2014     Status Recommendation to Physician:   Result of Recommendation:    Other ED Services  Consult Working Plan    DC Planning Services  Other  Outpatient Services - Pt will follow up   [facility]  [facility]ALTH   Choice offered to / List presented to:  C-1 Patient     HH arranged  HH-1 RN  HH-6 SOCIAL WORKER      HH agency  [facility]    Status of service:  Completed, signed off  ED Comments:  Return call from [place] [facility] at [phone] 04/27/14 to confirm receipt of referral & will process pt for services  ED Comments Detail:  CM reviewed in details medicare guidelines, home health [facility](HH) (length of stay in home, [facility] available, coverage, primary caregiver, up to 24 hrs before services may be started), Private duty nursing (PDN-coverage, length of stay in the home types of staff available),  assisted living (ASL- coverage, services offered) and Skilled nursing facilities (snf- coverage and services offered)

## 2014-04-27 NOTE — ED Notes (Signed)
Case manager at bedside for consult.

## 2014-04-27 NOTE — ED Notes (Signed)
Pt assisted to bathroom to void.  Pt denies further needs/complaints at this time.

## 2014-04-27 NOTE — ED Provider Notes (Signed)
CSN: [id number]     Arrival date & time 04/27/14  1031 History   First MD Initiated Contact with Patient 04/27/14 1057     Chief Complaint  Patient presents with  . Dementia     (Consider location/radiation/quality/duration/timing/severity/associated sxs/prior Treatment) Patient is a 80 y.o. female presenting with neurologic complaint. The history is provided by the patient.  Neurologic Problem This is a chronic problem. Episode onset: Months ago but worsening since yesterday. The problem occurs constantly. The problem has been rapidly worsening. Pertinent negatives include no chest pain, no abdominal pain, no headaches and no shortness of breath. Nothing aggravates the symptoms. Nothing relieves the symptoms. She has tried nothing for the symptoms. The treatment provided no relief.    Past Medical History  Diagnosis Date  . Depression   . Ejection fraction     EF 40%, echo, November, 2012  / in improved, EF 60%, echo, December, 2012  . Contrast media allergy     Patient feels poorly with contrast  . Status post AAA (abdominal aortic aneurysm) repair     Surgical repair, Dr. Hart RochesterLawson, December 27, 2011  . Pre-syncope     vasovagal w/ heart block  . Parotid mass     has refused further eval 10/2011  . GERD (gastroesophageal reflux disease)   . Anxiety   . Facial tic     L sided spasms - Botox trial summer 2013, ?effective  . Macular degeneration     bilateral vision loss  . AAA (abdominal aortic aneurysm)     s/p repair 12/2011  . Congestive heart failure     NL EF 10/2011 echo   . Diabetes mellitus type II, controlled   . Hypothyroidism   . Sinus bradycardia 09/19/2011    Occurring simultaneously with complete heart block   . Hypertension   . COPD (chronic obstructive pulmonary disease)   . 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 Dementia    Past Surgical History  Procedure Laterality Date  . Cholecystectomy    . Knee cartilage surgery       left  . Sinus surgery with instatrak    . Appendectomy    . Oophorectomy      ovarian cyst  . Cataract extraction      bilateral  . Bladder repair    . [place] echocardiography  10/14/11  . Abdominal aortic aneurysm repair  12/27/2011    Procedure: ANEURYSM ABDOMINAL AORTIC REPAIR;  Surgeon: Josephina GipJames Lawson, MD;  Location: [facility] OR;  Service: Vascular;  Laterality: N/A;  Resection and Grafting Abdominal Aortic Aneurysm , Aorta Bi Iliac.  Marland Kitchen. Pacemaker insertion  11/12  . Cardiac catheterization  11/04/11   Family History  Problem Relation Age of Onset  . Esophageal cancer Father   . Cancer Father   . Breast cancer Sister   . Cancer Sister   . Colon cancer Sister   . Heart disease Sister   . Heart disease Mother   . Heart disease Son   . Parkinson's disease Sister     younger   History  Substance Use Topics  . Smoking status: Former Smoker -- 1.00 packs/day for 60 years    Types: Cigarettes    Quit date: 08/31/2011  . Smokeless tobacco: Never Used  . Alcohol Use: No   OB History   Grav Para Term Preterm Abortions TAB SAB Ect Mult Living  Review of Systems  Constitutional: Negative for fever and fatigue.  HENT: Negative for congestion and drooling.   Eyes: Negative for pain.  Respiratory: Positive for cough. Negative for shortness of breath.   Cardiovascular: Negative for chest pain.  Gastrointestinal: Negative for nausea, vomiting, abdominal pain and diarrhea.  Genitourinary: Negative for dysuria and hematuria.  Musculoskeletal: Negative for back pain, gait problem and neck pain.  Skin: Negative for color change.  Neurological: Negative for dizziness and headaches.  Hematological: Negative for adenopathy.  Psychiatric/Behavioral: Negative for behavioral problems.  All other systems reviewed and are negative.     Allergies  Bactrim; Contrast media; Gadolinium derivatives; Iohexol; Penicillins; Sulfa antibiotics; Avelox; Ciprofloxacin; Delsym; and  Hydromet  Home Medications   Prior to Admission medications   Medication Sig Start Date End Date Taking? Authorizing Provider  acetaminophen (TYLENOL) 500 MG tablet Take 500 mg by mouth every 6 (six) hours as needed (pain).    Historical Provider, MD  albuterol (PROVENTIL HFA;VENTOLIN HFA) 108 (90 BASE) MCG/ACT inhaler Inhale 2 puffs into the lungs every 6 (six) hours as needed for wheezing or shortness of breath. 03/24/14   Newt Lukes, MD  ALPRAZolam Prudy Feeler) 0.5 MG tablet Take 1 tablet (0.5 mg total) by mouth 3 (three) times daily. 12/17/13   Newt Lukes, MD  aspirin 325 MG tablet Take 325 mg by mouth daily.    Historical Provider, MD  baclofen (LIORESAL) 10 MG tablet Take 10 mg by mouth 3 (three) times daily as needed. 03/24/14   Newt Lukes, MD  calcium carbonate (OS-CAL) 600 MG TABS Take 600 mg by mouth every other day.     Historical Provider, MD  carvedilol (COREG) 6.25 MG tablet Take 6.25 mg by mouth 2 (two) times daily with a meal.    Historical Provider, MD  cetirizine (ZYRTEC) 10 MG tablet Take 10 mg by mouth daily as needed for allergies.    Historical Provider, MD  Cholecalciferol (VITAMIN D3) 5000 UNITS TABS Take 5,000 Units by mouth every other day.     Historical Provider, MD  DULoxetine (CYMBALTA) 30 MG capsule Take 1 capsule (30 mg total) by mouth daily. 04/18/14   Pecola Lawless, MD  fexofenadine (ALLEGRA) 180 MG tablet Take 1 tablet (180 mg total) by mouth daily. 04/25/14   [name], MD  fluticasone (FLONASE) 50 MCG/ACT nasal spray Place 1 spray into both nostrils daily. 04/15/14   [name], MD  furosemide (LASIX) 20 MG tablet Take 20 mg by mouth daily.     Historical Provider, MD  losartan-hydrochlorothiazide (HYZAAR) 50-12.5 MG per tablet Take 1 tablet by mouth daily. 04/11/14   Pecola Lawless, MD  metFORMIN (GLUCOPHAGE-XR) 500 MG 24 hr tablet Take 250 mg by mouth at bedtime. 12/08/13   Newt Lukes, MD  ondansetron (ZOFRAN) 4 MG tablet  Take 4 mg by mouth every 8 (eight) hours as needed for nausea or vomiting.    Historical Provider, MD  pantoprazole (PROTONIX) 40 MG tablet Take 40 mg by mouth daily.    Historical Provider, MD  Polyethyl Glycol-Propyl Glycol (SYSTANE) 0.4-0.3 % SOLN Apply 1 drop to eye 2 (two) times daily as needed (dry eyes).    Historical Provider, MD  potassium chloride SA (K-DUR,KLOR-CON) 20 MEQ tablet Take 1 tablet by mouth  daily    Newt Lukes, MD   BP 135/103  Pulse 99  Temp(Src) 98.8 F (37.1 C) (Oral)  Resp 16  SpO2 97% Physical Exam  Nursing note  and vitals reviewed. Constitutional: She appears well-developed and well-nourished.  HENT:  Head: Normocephalic and atraumatic.  Mouth/Throat: Oropharynx is clear and moist. No oropharyngeal exudate.  Eyes: Conjunctivae and EOM are normal. Pupils are equal, round, and reactive to light.  Neck: Normal range of motion. Neck supple.  Cardiovascular: Normal rate, regular rhythm, normal heart sounds and intact distal pulses.  Exam reveals no gallop and no friction rub.   No murmur heard. Pulmonary/Chest: Effort normal and breath sounds normal. No respiratory distress. She has no wheezes.  Abdominal: Soft. Bowel sounds are normal. There is no tenderness. There is no rebound and no guarding.  Musculoskeletal: Normal range of motion. She exhibits no edema and no tenderness.  Neurological: She is alert. She has normal strength. No sensory deficit.  Pt does not believe the man w/ her is her husband.   Skin: Skin is warm and dry.  Psychiatric: She has a normal mood and affect. Her behavior is normal.    ED Course  Procedures (including critical care time) Labs Review Labs Reviewed  CBC WITH DIFFERENTIAL - Abnormal; Notable for the following:    WBC 14.4 (*)    RBC 3.53 (*)    Hemoglobin 11.7 (*)    MCV 102.3 (*)    Neutro Abs 10.3 (*)    All other components within normal limits  COMPREHENSIVE METABOLIC PANEL - Abnormal; Notable for the  following:    Glucose, Bld 153 (*)    GFR calc non Af Amer 61 (*)    GFR calc Af Amer 70 (*)    All other components within normal limits  URINALYSIS, ROUTINE W REFLEX MICROSCOPIC - Abnormal; Notable for the following:    Glucose, UA 100 (*)    Leukocytes, UA SMALL (*)    All other components within normal limits  URINE MICROSCOPIC-ADD ON - Abnormal; Notable for the following:    Bacteria, UA FEW (*)    All other components within normal limits  URINE CULTURE  TROPONIN I    Imaging Review Dg Chest 2 View  04/27/2014   CLINICAL DATA:  Cough.  Altered mental status.  EXAM: CHEST  2 VIEW  COMPARISON:  04/07/2014  FINDINGS: Mild enlargement of the cardiopericardial silhouette. Tortuous and atherosclerotic aorta.  Slight blunting of the left costophrenic angle. Old granulomatous disease, left lower lobe. Chronic scarring in the left lower lobe posteriorly.  IMPRESSION: 1. Mild cardiomegaly, without edema. 2. Chronic scarring in the left lower lobe. Slight blunting of the left costophrenic angle suggests a small trace left pleural effusion.   Electronically Signed   By: Herbie BaltimoreWalt  Liebkemann M.D.   On: 04/27/2014 12:31   Ct Head Wo Contrast  04/27/2014   CLINICAL DATA:  Progressive dimension symptoms  EXAM: CT HEAD WITHOUT CONTRAST  TECHNIQUE: Contiguous axial images were obtained from the base of the skull through the vertex without intravenous contrast.  COMPARISON:  Noncontrast CT scan of the brain of February 19, 2013  FINDINGS: There is mild diffuse cerebral atrophy with compensatory ventriculomegaly. There is stable decreased density in the deep white matter of both cerebral hemispheres. The cerebellum and brainstem are normal in density. There is no acute intracranial hemorrhage.  The paranasal sinuses are clear. There is no lytic or blastic skull lesion nor evidence of an acute fracture.  IMPRESSION: There are chronic changes consistent with small vessel ischemia. There is no acute intracranial  abnormality.   Electronically Signed   By: David  SwazilandJordan   On: 04/27/2014 12:10  EKG Interpretation   Date/Time:  Wednesday April 27 2014 11:40:11 EDT Ventricular Rate:  81 PR Interval:  239 QRS Duration: 87 QT Interval:  393 QTC Calculation: 456 R Axis:   35 Text Interpretation:  Sinus rhythm Prolonged PR interval Low voltage,  precordial leads No significant change since last tracing Confirmed by  Kylor Valverde  MD, Clovis Mankins ([id number]) on 04/27/2014 11:45:03 AM      MDM   Final diagnoses:  Dementia    11:21 AM 80 y.o. female who presents with altered mental status. The husband notes that she has dementia which has been worsening for months. He notes acute worsening since yesterday. He states the patient does not believe he is her husband in house also been talking about children in her house and other unusual statements. She was recently seen by her primary care provider for a cough. Zyrtec was switched to Allegra that she did not take the Allegra today. She is afebrile and vital signs are unremarkable here. She is very talkative on exam and insists that the man with her as her husband. A/ox1. Will get screening labs and imaging to rule out underlying medical reason for her worsening mental status.  3:14 PM: I interpreted/reviewed the labs and/or imaging which were non-contributory.  Do not think pt has UTI, will send cx. Had care management speak with the patient and her husband about physical therapy/nursing aide at home as well as social work. They have set this up for them and the husband is happy with this plan. He believes that he can manage her at home with some help. I encouraged him to reopen the discussion of possible long-term placement with his primary care physician. I also encouraged him to return for any worsening. I have discussed the diagnosis/risks/treatment options with the patient and husband and believe the pt to be eligible for discharge home to follow-up with pcp as needed.  We also discussed returning to the ED immediately if new or worsening sx occur. We discussed the sx which are most concerning (e.g., worsening MS, fever, vomiting, diarrhea, pain) that necessitate immediate return. Medications administered to the patient during their visit and any new prescriptions provided to the patient are listed below.  Medications given during this visit Medications - No data to display  Discharge Medication List as of 04/27/2014  3:14 PM       Randa Spike Mort Sawyers, MD 04/27/14 1557

## 2014-04-28 ENCOUNTER — Ambulatory Visit (INDEPENDENT_AMBULATORY_CARE_PROVIDER_SITE_OTHER): Payer: Medicare Other | Admitting: Internal Medicine

## 2014-04-28 ENCOUNTER — Ambulatory Visit: Payer: Medicare Other | Admitting: Physical Therapy

## 2014-04-28 ENCOUNTER — Encounter: Payer: Self-pay | Admitting: Internal Medicine

## 2014-04-28 VITALS — BP 126/66 | HR 76 | Temp 98.2°F | Wt 176.0 lb

## 2014-04-28 DIAGNOSIS — N3000 Acute cystitis without hematuria: Secondary | ICD-10-CM

## 2014-04-28 DIAGNOSIS — R300 Dysuria: Secondary | ICD-10-CM

## 2014-04-28 LAB — POCT URINALYSIS DIPSTICK
Bilirubin, UA: NEGATIVE
GLUCOSE UA: NEGATIVE
Ketones, UA: NEGATIVE
Nitrite, UA: NEGATIVE
Spec Grav, UA: >=1.03
UROBILINOGEN UA: 0.2
pH, UA: 6

## 2014-04-28 MED ORDER — LEVOFLOXACIN 500 MG PO TABS: 500.0000 mg | ORAL_TABLET | Freq: Every day | ORAL | Status: DC

## 2014-04-28 NOTE — Progress Notes (Signed)
HPI  Pt presents to the clinic today with c/o dysuria. She reports this started. She did go to UC yesterday but they did not treat her for an UTI. She denies fever, chills, low back pain. She has not taken anything OTC. She does have a history of recurrent UTI's.   Review of Systems  Past Medical History  Diagnosis Date  . Depression   . Ejection fraction     EF 40%, echo, November, 2012  / in improved, EF 60%, echo, December, 2012  . Contrast media allergy     Patient feels poorly with contrast  . Status post AAA (abdominal aortic aneurysm) repair     Surgical repair, Dr. Hart RochesterLawson, December 27, 2011  . Pre-syncope     vasovagal w/ heart block  . Parotid mass     has refused further eval 10/2011  . GERD (gastroesophageal reflux disease)   . Anxiety   . Facial tic     L sided spasms - Botox trial summer 2013, ?effective  . Macular degeneration     bilateral vision loss  . AAA (abdominal aortic aneurysm)     s/p repair 12/2011  . Congestive heart failure     NL EF 10/2011 echo   . Diabetes mellitus type II, controlled   . Hypothyroidism   . Sinus bradycardia 09/19/2011    Occurring simultaneously with complete heart block   . Hypertension   . COPD (chronic obstructive pulmonary disease)   . 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 Dementia     Family History  Problem Relation Age of Onset  . Esophageal cancer Father   . Cancer Father   . Breast cancer Sister   . Cancer Sister   . Colon cancer Sister   . Heart disease Sister   . Heart disease Mother   . Heart disease Son   . Parkinson's disease Sister     younger    History   Social History  . Marital Status: Married    Spouse Name: Reita ClicheBobby    Number of Children: 4  . Years of Education: 9th   Occupational History  . retired Other    worked for 25 years, hairdresser   Social History Main Topics  . Smoking status: Former Smoker -- 1.00 packs/day for 60 years    Types: Cigarettes   Quit date: 08/31/2011  . Smokeless tobacco: Never Used  . Alcohol Use: No  . Drug Use: No  . Sexual Activity: Not on file   Other Topics Concern  . Not on file   Social History Narrative   Patient is married [name]([name]) and lives at home with her husband.   Patient worked as a Interior and spatial designerhairdresser for 25 years.   Patient drinks a caffeine beverage daily.   Patient is right-handed.   Patient has four children, three are deceased and one child is living.    Allergies  Allergen Reactions  . Bactrim [Sulfamethoxazole-Tmp Ds] Anaphylaxis  . Contrast Media [Iodinated Diagnostic Agents] Anaphylaxis  . Gadolinium Derivatives Other (See Comments)    Unknown   . Iohexol Anaphylaxis, Shortness Of Breath and Swelling     Desc: PT STATES SHE HAD A SEVERE REACTION TO IV CONRAST WITH THROAT SWELLING AND SOB. SHE WAS ADMITTED TO THE HOSPITAL. SHE HAS NEVER HAD CONTRAST AGAIN.   Marland Kitchen. Penicillins Rash  . Sulfa Antibiotics Anaphylaxis  . Avelox [Moxifloxacin Hcl In Nacl] Rash  . Ciprofloxacin Itching  .  Delsym [Dextromethorphan] Other (See Comments)    Hallucination  . Hydromet [Hydrocodone-Homatropine] Other (See Comments)    Pt states med make her hallucinate    Constitutional: Denies fever, malaise, fatigue, headache or abrupt weight changes.   GU: Pt reports urgency, frequency and pain with urination. Denies burning sensation, blood in urine, odor or discharge. Skin: Denies redness, rashes, lesions or ulcercations.   No other specific complaints in a complete review of systems (except as listed in HPI above).    Objective:   Physical Exam  There were no vitals taken for this visit. Wt Readings from Last 3 Encounters:  04/25/14 178 lb 8 oz (80.967 kg)  04/15/14 173 lb 12 oz (78.812 kg)  04/11/14 173 lb (78.472 kg)    General: Appears her stated age, well developed, well nourished in NAD. Cardiovascular: Normal rate and rhythm. S1,S2 noted.  No murmur, rubs or gallops noted. No JVD or BLE edema.  No carotid bruits noted. Pulmonary/Chest: Normal effort and positive vesicular breath sounds. No respiratory distress. No wheezes, rales or ronchi noted.  Abdomen: Soft and nontender. Normal bowel sounds, no bruits noted. No distention or masses noted. Liver, spleen and kidneys non palpable. Tender to palpation over the bladder area. No CVA tenderness.      Assessment & Plan:   Urgency, Frequency, Dysuria secondary to UTI  Urinalysis: small blood, mod leuks eRx sent if for Levaquin x 7 days OK to take AZO OTC Drink plenty of fluids  RTC as needed or if symptoms persist.

## 2014-04-28 NOTE — Progress Notes (Signed)
04/28/14 1819 ED Cm f/u with pt at [phone] Pt states she is doing better after having to go see her PA, regina, at pcp office today and Dx with an UTI ans placed on Levaquin 500 mg po x 1 for 7 days  therefore missing her scheduled PT appointment. [facility] and will be arriving on 04/29/14 between 1000-1030 Encouraged a return call from pt if CM needed

## 2014-04-28 NOTE — Patient Instructions (Signed)

## 2014-04-28 NOTE — Progress Notes (Signed)
Pre visit review using our clinic review tool, if applicable. No additional management support is needed unless otherwise documented below in the visit note. 

## 2014-04-29 ENCOUNTER — Telehealth: Payer: Self-pay | Admitting: Nurse Practitioner

## 2014-04-29 LAB — URINE CULTURE: Colony Count: >=100000

## 2014-04-29 NOTE — Telephone Encounter (Signed)
Patient's spouse called and stated patient has been experiencing confusion and memory loss for past couple of days.  Looking for her deceased first husband, etc.  Please return call.

## 2014-04-30 ENCOUNTER — Telehealth (HOSPITAL_BASED_OUTPATIENT_CLINIC_OR_DEPARTMENT_OTHER): Payer: Self-pay | Admitting: Emergency Medicine

## 2014-04-30 NOTE — Telephone Encounter (Signed)
Post ED Visit - Positive Culture Follow-up  Culture report reviewed by antimicrobial stewardship pharmacist: []  Wes Dulaney, Pharm.D., BCPS []  Celedonio MiyamotoJeremy Frens, Pharm.D., BCPS []  Georgina PillionElizabeth Martin, Pharm.D., BCPS []  RioMinh Pham, [address]Ph[address]D., BCPS, AAHIVP []  Estella HuskMichelle Turner, Pharm.D., BCPS, AAHIVP []  Harvie JuniorNathan Cope, Pharm.D. [x]  Christoper Fabianaron Amend, [address]  Positive urine culture Treated with Levaquin, organism sensitive to the same and no further patient follow-up is required at this time.  Zeb ComfortHolland, Vincenzina Jagoda 04/30/2014, 4:23 PM

## 2014-05-02 ENCOUNTER — Encounter: Payer: Self-pay | Admitting: Family Medicine

## 2014-05-02 ENCOUNTER — Ambulatory Visit (INDEPENDENT_AMBULATORY_CARE_PROVIDER_SITE_OTHER): Payer: Medicare Other | Admitting: Family Medicine

## 2014-05-02 VITALS — BP 128/68 | HR 86 | Temp 99.0°F | Wt 176.5 lb

## 2014-05-02 DIAGNOSIS — R5383 Other fatigue: Secondary | ICD-10-CM

## 2014-05-02 DIAGNOSIS — R059 Cough, unspecified: Secondary | ICD-10-CM

## 2014-05-02 DIAGNOSIS — F0390 Unspecified dementia without behavioral disturbance: Secondary | ICD-10-CM

## 2014-05-02 DIAGNOSIS — G459 Transient cerebral ischemic attack, unspecified: Secondary | ICD-10-CM

## 2014-05-02 DIAGNOSIS — R05 Cough: Secondary | ICD-10-CM

## 2014-05-02 DIAGNOSIS — F068 Other specified mental disorders due to known physiological condition: Secondary | ICD-10-CM

## 2014-05-02 DIAGNOSIS — R5381 Other malaise: Secondary | ICD-10-CM

## 2014-05-02 MED ORDER — BENZONATATE 100 MG PO CAPS: 100.0000 mg | ORAL_CAPSULE | Freq: Every evening | ORAL | Status: DC | PRN

## 2014-05-02 NOTE — Progress Notes (Signed)
BP 128/68  Pulse 86  Temp(Src) 99 F (37.2 C) (Oral)  Wt 176 lb 8 oz (80.06 kg)  SpO2 96%   CC: back pain, cough  Subjective:    Patient ID: Paula [name], female    DOB: [birth year], 80 y.o.   MRN: [id number]  HPI: Paula [name] is a 80 y.o. female presenting on 05/02/2014 for Back Pain and Cough   Since seen by myself last week, seen at ER with worsened confusion then next day seen by Nicki Reaperegina Baity with UTI (>100k pansens Ecoli) - treated with levaquin course. Intermittent UTI sxs. Was feeling better until today. Today describes gen malaise, along with back pain in h/o chronic lower back pain. Some diarrhea. Increased confusion attributed to UTI. Persistent night time coughing.   ER records reviewed as well as last office visit here.  WBC 14.4.  Denies abd pain, fevers/chills, nausea/vomiting, chest pain, dyspnea.  [facility] [place] [facility] [place] [facility] He started gabapentin.  Lab Results  Component Value Date   CREATININE 0.85 04/27/2014   CT HEAD WITHOUT CONTRAST  TECHNIQUE:  Contiguous axial images were obtained from the base of the skull  through the vertex without intravenous contrast.  COMPARISON: Noncontrast CT scan of the brain of February 19, 2013  FINDINGS:  There is mild diffuse cerebral atrophy with compensatory  ventriculomegaly. There is stable decreased density in the deep  white matter of both cerebral hemispheres. The cerebellum and  brainstem are normal in density. There is no acute intracranial  hemorrhage.  The paranasal sinuses are clear. There is no lytic or blastic skull  lesion nor evidence of an acute fracture.  IMPRESSION:  There are chronic changes consistent with small vessel ischemia.  There is no acute intracranial abnormality.  Electronically Signed  By: David SwazilandJordan  On: 04/27/2014 12:10  Relevant past medical, surgical, family and social history reviewed and updated as indicated.  Allergies and medications reviewed and  updated. Current Outpatient Prescriptions on File Prior to Visit  Medication Sig  . acetaminophen (TYLENOL) 500 MG tablet Take 500 mg by mouth every 6 (six) hours as needed (pain).  Marland Kitchen. albuterol (PROVENTIL HFA;VENTOLIN HFA) 108 (90 BASE) MCG/ACT inhaler Inhale 2 puffs into the lungs every 6 (six) hours as needed for wheezing or shortness of breath.  . ALPRAZolam (XANAX) 0.5 MG tablet Take 1 tablet (0.5 mg total) by mouth 3 (three) times daily.  Marland Kitchen. aspirin 325 MG tablet Take 325 mg by mouth daily.  . baclofen (LIORESAL) 10 MG tablet Take 10 mg by mouth 3 (three) times daily as needed for muscle spasms.   . calcium carbonate (OS-CAL) 600 MG TABS Take 600 mg by mouth every other day.   . carvedilol (COREG) 6.25 MG tablet Take 6.25 mg by mouth 2 (two) times daily with a meal.  . Cholecalciferol (VITAMIN D3) 5000 UNITS TABS Take 5,000 Units by mouth every other day.   . cyanocobalamin 500 MCG tablet Take 500 mcg by mouth daily.  . fexofenadine (ALLEGRA) 180 MG tablet Take 1 tablet (180 mg total) by mouth daily.  . fluticasone (FLONASE) 50 MCG/ACT nasal spray Place 1 spray into both nostrils daily.  . furosemide (LASIX) 20 MG tablet Take 20 mg by mouth every morning.   . gabapentin (NEURONTIN) 100 MG capsule Take 100-200 mg by mouth 3 (three) times daily. Take 1 capsule (100mg ) in the morning, 1 capsule (100mg ) in the afternoon, and 2 capsules (200mg ) at night.  .Marland Kitchen  levofloxacin (LEVAQUIN) 500 MG tablet Take 1 tablet (500 mg total) by mouth daily.  Marland Kitchen. levothyroxine (SYNTHROID, LEVOTHROID) 175 MCG tablet Take 87.5-175 mcg by mouth daily before breakfast. Take 1 tab (175mcg) on Sunday, Monday, Wednesday, Friday, and Saturday. Take 0.5 tab (87.445mcg) on Tuesday and Thursday.  . losartan-hydrochlorothiazide (HYZAAR) 50-12.5 MG per tablet Take 1 tablet by mouth daily.  . metFORMIN (GLUCOPHAGE-XR) 500 MG 24 hr tablet Take 250 mg by mouth at bedtime.  . ondansetron (ZOFRAN) 4 MG tablet Take 4 mg by mouth every 8  (eight) hours as needed for nausea or vomiting.  . pantoprazole (PROTONIX) 40 MG tablet Take 40 mg by mouth daily.  Bertram Gala. Polyethyl Glycol-Propyl Glycol (SYSTANE) 0.4-0.3 % SOLN Apply 1 drop to eye 2 (two) times daily as needed (dry eyes).  . potassium chloride SA (K-DUR,KLOR-CON) 20 MEQ tablet Take 1 tablet by mouth  daily   No current facility-administered medications on file prior to visit.    Review of Systems Per HPI unless specifically indicated above    Objective:    BP 128/68  Pulse 86  Temp(Src) 99 F (37.2 C) (Oral)  Wt 176 lb 8 oz (80.06 kg)  SpO2 96%  Physical Exam  Nursing note and vitals reviewed. Constitutional: She appears well-developed and well-nourished. No distress.  HENT:  Mouth/Throat: Oropharynx is clear and moist. No oropharyngeal exudate.  Eyes: Conjunctivae and EOM are normal. Pupils are equal, round, and reactive to light. No scleral icterus.  Cardiovascular: Normal rate, regular rhythm, normal heart sounds and intact distal pulses.   No murmur heard. Pulmonary/Chest: Effort normal and breath sounds normal. No respiratory distress. She has no wheezes. She has no rales.  Abdominal: Soft. Bowel sounds are normal. She exhibits no distension and no mass. There is no hepatosplenomegaly. There is tenderness (mild) in the right lower quadrant. There is no rigidity, no rebound, no guarding, no CVA tenderness and negative Murphy's sign.  Musculoskeletal: She exhibits no edema.  Skin: Skin is warm and dry. No rash noted.       Assessment & Plan:   Problem List Items Addressed This Visit   Other malaise and fatigue     Unclear etiology. I did encourage her to finish levaquin course and then notify me if persistenly feeling ill. Has had extensive eval in past by prior PCP - malaise and gen "unwell feel" thought psychogenic - with significant anxiety. Will continue to monitor for now.    Dementia arising in the senium and presenium     Chronic issue. ?mild senile  dementia vs mild cognitive impairment.  Encouraged continued B12 replacement. MMSE 02/2014: 15/30. I have asked her to return for geriatric assessment at next visit. Anticipate significant dementia component. MRI with chronic small vessel ischemic changes and gen atrophy.    Cough - Primary     Persistent cough despite lungs CTAB. ?psychogenic.  Will continue tessalon perls - but decrease dose to 100mg  prn. Pt and husband agree with plan.        Follow up plan: Return if symptoms worsen or fail to improve, for follow up visit.

## 2014-05-02 NOTE — Assessment & Plan Note (Signed)
Persistent cough despite lungs CTAB. ?psychogenic.  Will continue tessalon perls - but decrease dose to 100mg  prn. Pt and husband agree with plan.

## 2014-05-02 NOTE — Telephone Encounter (Signed)
Spoke to patient and patient's husband.  The patient has a UTI which can contribute to the confusion.  She is on an antibiotic and has an appointment with her PCP this afternoon.

## 2014-05-02 NOTE — Progress Notes (Signed)
Pre visit review using our clinic review tool, if applicable. No additional management support is needed unless otherwise documented below in the visit note. 

## 2014-05-02 NOTE — Assessment & Plan Note (Addendum)
Chronic issue. ?mild senile dementia vs mild cognitive impairment.  Encouraged continued B12 replacement. MMSE 02/2014: 15/30. I have asked her to return for geriatric assessment at next visit. Anticipate significant dementia component. MRI with chronic small vessel ischemic changes and gen atrophy.

## 2014-05-02 NOTE — Patient Instructions (Signed)
I think you are doing well. I want you to finish antibiotic. Return if not feeling better after this. Return in next few weeks for geriatric assessment.

## 2014-05-02 NOTE — Assessment & Plan Note (Signed)
Unclear etiology. I did encourage her to finish levaquin course and then notify me if persistenly feeling ill. Has had extensive eval in past by prior PCP - malaise and gen "unwell feel" thought psychogenic - with significant anxiety. Will continue to monitor for now.

## 2014-05-06 ENCOUNTER — Encounter (HOSPITAL_COMMUNITY): Payer: Self-pay | Admitting: Emergency Medicine

## 2014-05-06 ENCOUNTER — Emergency Department (HOSPITAL_COMMUNITY)
Admission: EM | Admit: 2014-05-06 | Discharge: 2014-05-06 | Disposition: A | Discharge status: Home / Self Care | Payer: Medicare Other | Attending: Emergency Medicine | Admitting: Emergency Medicine

## 2014-05-06 ENCOUNTER — Telehealth: Payer: Self-pay | Admitting: Family Medicine

## 2014-05-06 ENCOUNTER — Emergency Department (HOSPITAL_COMMUNITY): Payer: Medicare Other

## 2014-05-06 DIAGNOSIS — IMO0002 Reserved for concepts with insufficient information to code with codable children: Secondary | ICD-10-CM | POA: Insufficient documentation

## 2014-05-06 DIAGNOSIS — F411 Generalized anxiety disorder: Secondary | ICD-10-CM | POA: Insufficient documentation

## 2014-05-06 DIAGNOSIS — F3289 Other specified depressive episodes: Secondary | ICD-10-CM | POA: Insufficient documentation

## 2014-05-06 DIAGNOSIS — I10 Essential (primary) hypertension: Secondary | ICD-10-CM | POA: Insufficient documentation

## 2014-05-06 DIAGNOSIS — J449 Chronic obstructive pulmonary disease, unspecified: Secondary | ICD-10-CM | POA: Insufficient documentation

## 2014-05-06 DIAGNOSIS — Z79899 Other long term (current) drug therapy: Secondary | ICD-10-CM | POA: Insufficient documentation

## 2014-05-06 DIAGNOSIS — F0391 Unspecified dementia with behavioral disturbance: Secondary | ICD-10-CM

## 2014-05-06 DIAGNOSIS — K219 Gastro-esophageal reflux disease without esophagitis: Secondary | ICD-10-CM | POA: Insufficient documentation

## 2014-05-06 DIAGNOSIS — Z9889 Other specified postprocedural states: Secondary | ICD-10-CM | POA: Insufficient documentation

## 2014-05-06 DIAGNOSIS — F329 Major depressive disorder, single episode, unspecified: Secondary | ICD-10-CM | POA: Insufficient documentation

## 2014-05-06 DIAGNOSIS — Z880 Allergy status to penicillin: Secondary | ICD-10-CM | POA: Insufficient documentation

## 2014-05-06 DIAGNOSIS — Z792 Long term (current) use of antibiotics: Secondary | ICD-10-CM | POA: Insufficient documentation

## 2014-05-06 DIAGNOSIS — E039 Hypothyroidism, unspecified: Secondary | ICD-10-CM | POA: Insufficient documentation

## 2014-05-06 DIAGNOSIS — Z87891 Personal history of nicotine dependence: Secondary | ICD-10-CM | POA: Insufficient documentation

## 2014-05-06 DIAGNOSIS — J4489 Other specified chronic obstructive pulmonary disease: Secondary | ICD-10-CM | POA: Insufficient documentation

## 2014-05-06 DIAGNOSIS — E119 Type 2 diabetes mellitus without complications: Secondary | ICD-10-CM | POA: Insufficient documentation

## 2014-05-06 DIAGNOSIS — I509 Heart failure, unspecified: Secondary | ICD-10-CM | POA: Insufficient documentation

## 2014-05-06 DIAGNOSIS — Z8669 Personal history of other diseases of the nervous system and sense organs: Secondary | ICD-10-CM | POA: Insufficient documentation

## 2014-05-06 DIAGNOSIS — F03918 Unspecified dementia, unspecified severity, with other behavioral disturbance: Secondary | ICD-10-CM | POA: Insufficient documentation

## 2014-05-06 DIAGNOSIS — Z7982 Long term (current) use of aspirin: Secondary | ICD-10-CM | POA: Insufficient documentation

## 2014-05-06 LAB — URINALYSIS, ROUTINE W REFLEX MICROSCOPIC
BILIRUBIN URINE: NEGATIVE
GLUCOSE, UA: NEGATIVE mg/dL
HGB URINE DIPSTICK: NEGATIVE
KETONES UR: NEGATIVE mg/dL
Leukocytes, UA: NEGATIVE
Nitrite: NEGATIVE
PH: 5 (ref 5.0–8.0)
Protein, ur: NEGATIVE mg/dL
SPECIFIC GRAVITY, URINE: 1.015 (ref 1.005–1.030)
Urobilinogen, UA: 0.2 mg/dL (ref 0.0–1.0)

## 2014-05-06 LAB — COMPREHENSIVE METABOLIC PANEL
ALT: 17 U/L (ref 0–35)
AST: 24 U/L (ref 0–37)
Albumin: 3.8 g/dL (ref 3.5–5.2)
Alkaline Phosphatase: 52 U/L (ref 39–117)
BILIRUBIN TOTAL: 0.4 mg/dL (ref 0.3–1.2)
BUN: 18 mg/dL (ref 6–23)
CHLORIDE: 98 meq/L (ref 96–112)
CO2: 28 meq/L (ref 19–32)
CREATININE: 0.92 mg/dL (ref 0.50–1.10)
Calcium: 9.9 mg/dL (ref 8.4–10.5)
GFR calc Af Amer: 64 mL/min — ABNORMAL LOW (ref >90)
GFR calc non Af Amer: 55 mL/min — ABNORMAL LOW (ref >90)
Glucose, Bld: 114 mg/dL — ABNORMAL HIGH (ref 70–99)
Potassium: 3.7 mEq/L (ref 3.7–5.3)
Sodium: 140 mEq/L (ref 137–147)
Total Protein: 6.8 g/dL (ref 6.0–8.3)

## 2014-05-06 LAB — CBC
HCT: 34.1 % — ABNORMAL LOW (ref 36.0–46.0)
Hemoglobin: 11.1 g/dL — ABNORMAL LOW (ref 12.0–15.0)
MCH: 33.3 pg (ref 26.0–34.0)
MCHC: 32.6 g/dL (ref 30.0–36.0)
MCV: 102.4 fL — AB (ref 78.0–100.0)
Platelets: 298 10*3/uL (ref 150–400)
RBC: 3.33 MIL/uL — AB (ref 3.87–5.11)
RDW: 14.9 % (ref 11.5–15.5)
WBC: 13.4 10*3/uL — AB (ref 4.0–10.5)

## 2014-05-06 LAB — CBG MONITORING, ED: Glucose-Capillary: 121 mg/dL — ABNORMAL HIGH (ref 70–99)

## 2014-05-06 MED ORDER — HALOPERIDOL 1 MG PO TABS
0.5000 mg | ORAL_TABLET | Freq: Once | ORAL | Status: AC
  Administered 2014-05-06: 0.5 mg via ORAL
  Filled 2014-05-06: qty 1

## 2014-05-06 MED ORDER — HALOPERIDOL 0.5 MG PO TABS: 0.5000 mg | ORAL_TABLET | Freq: Three times a day (TID) | ORAL | Status: DC

## 2014-05-06 NOTE — ED Notes (Signed)
MD Wentz at the bedside  

## 2014-05-06 NOTE — ED Notes (Signed)
Per EMS, Husband reports patient has been more confused for the past week. Patient was diagnosed with dementia six months ago, and diagnosed by MD with UTI on Monday. Patient has been taking Antibiotics for UTI. Patient was combative on seen and would not comply. EMS gave patient 2.5 mg of Haldol and 5 mg of Versed. Upon arrival, patient is awake and alert x4. Vitals per EMS: 104/56, 98 on 3L, 16 RR, 72 HR.

## 2014-05-06 NOTE — ED Provider Notes (Signed)
CSN: [id number]     Arrival date & time 05/06/14  1354 History   First MD Initiated Contact with Patient 05/06/14 1428     Chief Complaint  Patient presents with  . Altered Mental Status   HPI Comments: Patient is an 80 y.o. Female who presents to the [facility] ED with her husband for 1 week of worsening mental status.  Patient is unable to provide a history at this time and history gathered is from her husband.  Her husband states that the patient has had worsening confusion over the past week.  He states that she was recently diagnosed with a UTI by an NP at [facility] and was treated with 7 days of keflex.  Patient completed the keflex on Wednesday.  Patient has been mumbling and accusatory.  Husband states that to the best of his knowledge there have been no medication changes other than the addition of keflex for the UTI.  He states that she has had mild cough, congestion, increased swelling in her legs, and perhaps some increased distention in the abdomen.  Her husband is very apprehensive that he will not be able to continue to care for the patient at home.  Patient apparently had to be sedated with haldol in order to be brought to the ED.  Patient is scheduled to be seen by psychiatry by Dr. Dellia Cloud in a few days.       Patient is a 80 y.o. female presenting with altered mental status. The history is provided by the spouse. No language interpreter was used.  Altered Mental Status   Past Medical History  Diagnosis Date  . Depression   . Ejection fraction     EF 40%, echo, November, 2012  / in improved, EF 60%, echo, December, 2012  . Contrast media allergy     Patient feels poorly with contrast  . Status post AAA (abdominal aortic aneurysm) repair     Surgical repair, Dr. Hart Rochester, December 27, 2011  . Pre-syncope     vasovagal w/ heart block  . Parotid mass     has refused further eval 10/2011  . GERD (gastroesophageal reflux disease)   . Anxiety   . Facial tic     L sided  spasms - Botox trial summer 2013, ?effective  . Macular degeneration     bilateral vision loss  . AAA (abdominal aortic aneurysm)     s/p repair 12/2011  . Congestive heart failure     NL EF 10/2011 echo   . Diabetes mellitus type II, controlled   . Hypothyroidism   . Sinus bradycardia 09/19/2011    Occurring simultaneously with complete heart block   . Hypertension   . COPD (chronic obstructive pulmonary disease)   . Facial spasm     L hemifacial-treated w/ botox xeomin -Dr Terrace Arabia  . Dementia     MRI with progressive chronic microvascular ischemia and generalized cerebral atrophy   Past Surgical History  Procedure Laterality Date  . Cholecystectomy    . Knee cartilage surgery      left  . Sinus surgery with instatrak    . Appendectomy    . Oophorectomy      ovarian cyst  . Cataract extraction      bilateral  . Bladder repair    . US echocardiography  10/14/11  . Abdominal aortic aneurysm repair  12/27/2011    ANEURYSM ABDOMINAL AORTIC REPAIR;  Surgeon: Josephina Gip, MD; Resection and Grafting Abdominal Aortic Aneurysm ,  Aorta Bi Iliac.  Marland Kitchen. Pacemaker insertion  11/12  . Cardiac catheterization  11/04/11   Family History  Problem Relation Age of Onset  . Esophageal cancer Father   . Cancer Father   . Breast cancer Sister   . Cancer Sister   . Colon cancer Sister   . Heart disease Sister   . Heart disease Mother   . Heart disease Son   . Parkinson's disease Sister     younger   History  Substance Use Topics  . Smoking status: Former Smoker -- 1.00 packs/day for 60 years    Types: Cigarettes    Quit date: 08/31/2011  . Smokeless tobacco: Never Used  . Alcohol Use: No   OB History   Grav Para Term Preterm Abortions TAB SAB Ect Mult Living                 Review of Systems  Unable to perform ROS: Mental status change      Allergies  Bactrim; Contrast media; Gadolinium derivatives; Iohexol; Penicillins; Sulfa antibiotics; Avelox; Ciprofloxacin; Cymbalta; Delsym;  and Hydromet  Home Medications   Prior to Admission medications   Medication Sig Start Date End Date Taking? Authorizing Provider  acetaminophen (TYLENOL) 500 MG tablet Take 500 mg by mouth every 6 (six) hours as needed (pain).   Yes Historical Provider, MD  albuterol (PROVENTIL HFA;VENTOLIN HFA) 108 (90 BASE) MCG/ACT inhaler Inhale 2 puffs into the lungs every 6 (six) hours as needed for wheezing or shortness of breath. 03/24/14  Yes Newt LukesValerie A Leschber, MD  ALPRAZolam Prudy Feeler(XANAX) 0.5 MG tablet Take 1 tablet (0.5 mg total) by mouth 3 (three) times daily. 12/17/13  Yes Newt LukesValerie A Leschber, MD  aspirin 325 MG tablet Take 325 mg by mouth daily.   Yes Historical Provider, MD  baclofen (LIORESAL) 10 MG tablet Take 10 mg by mouth 3 (three) times daily as needed for muscle spasms.  03/24/14  Yes Newt LukesValerie A Leschber, MD  benzonatate (TESSALON) 100 MG capsule Take 1 capsule (100 mg total) by mouth at bedtime as needed for cough. 05/02/14  Yes Eustaquio BoydenJavier Gutierrez, MD  calcium carbonate (OS-CAL) 600 MG TABS Take 600 mg by mouth every other day.    Yes Historical Provider, MD  carvedilol (COREG) 6.25 MG tablet Take 6.25 mg by mouth 2 (two) times daily with a meal.   Yes Historical Provider, MD  Cholecalciferol (VITAMIN D3) 5000 UNITS TABS Take 5,000 Units by mouth every other day.    Yes Historical Provider, MD  cyanocobalamin 500 MCG tablet Take 500 mcg by mouth daily.   Yes Historical Provider, MD  fexofenadine (ALLEGRA) 180 MG tablet Take 1 tablet (180 mg total) by mouth daily. 04/25/14  Yes Eustaquio BoydenJavier Gutierrez, MD  fluticasone (FLONASE) 50 MCG/ACT nasal spray Place 1 spray into both nostrils daily. 04/15/14  Yes Eustaquio BoydenJavier Gutierrez, MD  furosemide (LASIX) 20 MG tablet Take 20 mg by mouth every morning.    Yes Historical Provider, MD  gabapentin (NEURONTIN) 100 MG capsule Take 100-200 mg by mouth 3 (three) times daily. Take 1 capsule (100mg ) in the morning, 1 capsule (100mg ) in the afternoon, and 2 capsules (200mg ) at night.    Yes Historical Provider, MD  levothyroxine (SYNTHROID, LEVOTHROID) 175 MCG tablet Take 87.5-175 mcg by mouth daily before breakfast. Take 1 tab (175mcg) on Sunday, Monday, Wednesday, Friday, and Saturday. Take 0.5 tab (87.205mcg) on Tuesday and Thursday.   Yes Historical Provider, MD  losartan-hydrochlorothiazide (HYZAAR) 50-12.5 MG per tablet Take 1 tablet  by mouth daily. 04/11/14  Yes Pecola LawlessWilliam F Hopper, MD  metFORMIN (GLUCOPHAGE-XR) 500 MG 24 hr tablet Take 250 mg by mouth at bedtime. 12/08/13  Yes Newt LukesValerie A Leschber, MD  ondansetron (ZOFRAN) 4 MG tablet Take 4 mg by mouth every 8 (eight) hours as needed for nausea or vomiting.   Yes Historical Provider, MD  pantoprazole (PROTONIX) 40 MG tablet Take 40 mg by mouth daily.   Yes Historical Provider, MD  Polyethyl Glycol-Propyl Glycol (SYSTANE) 0.4-0.3 % SOLN Apply 1 drop to eye 2 (two) times daily as needed (dry eyes).   Yes Historical Provider, MD  potassium chloride SA (K-DUR,KLOR-CON) 20 MEQ tablet Take 1 tablet by mouth  daily   Yes Newt LukesValerie A Leschber, MD  sertraline (ZOLOFT) 50 MG tablet Take 50 mg by mouth daily.   Yes Historical Provider, MD  levofloxacin (LEVAQUIN) 500 MG tablet Take 1 tablet (500 mg total) by mouth daily. 04/28/14   Nicki Reaperegina Baity, NP   BP 156/60  Pulse 72  Temp(Src) 98.3 F (36.8 C) (Oral)  Resp 19  SpO2 93% Physical Exam  Nursing note and vitals reviewed. Constitutional: She appears well-developed and well-nourished. No distress.  HENT:  Head: Normocephalic and atraumatic.  Mouth/Throat: Oropharynx is clear and moist. No oropharyngeal exudate.  Eyes: Conjunctivae and EOM are normal. Pupils are equal, round, and reactive to light. No scleral icterus.  Neck: Normal range of motion. Neck supple. No thyromegaly present.  Cardiovascular: Normal rate, regular rhythm, normal heart sounds and intact distal pulses.  Exam reveals no gallop and no friction rub.   No murmur heard. Pulmonary/Chest: Effort normal and breath sounds  normal. She has no decreased breath sounds. She has no wheezes. She has no rales. She exhibits no tenderness.  Scattered Rhonchi  Abdominal: Soft. Bowel sounds are normal. She exhibits no distension and no mass. There is no tenderness. There is no rebound and no guarding.  Musculoskeletal: Normal range of motion.  Lymphadenopathy:    She has no cervical adenopathy.  Neurological: She is alert. No cranial nerve deficit or sensory deficit. Coordination normal.  Patient is oriented to self, but lacks orientation to others calling her husband her nurse, and also is not oriented to place.  Skin: Skin is warm and dry. She is not diaphoretic.  Psychiatric: Her mood appears anxious. Thought content is paranoid. Cognition and memory are impaired.  Patient unable to answer questions appropriately.     ED Course  Procedures (including critical care time) Labs Review Labs Reviewed  CBC - Abnormal; Notable for the following:    WBC 13.4 (*)    RBC 3.33 (*)    Hemoglobin 11.1 (*)    HCT 34.1 (*)    MCV 102.4 (*)    All other components within normal limits  CBG MONITORING, ED - Abnormal; Notable for the following:    Glucose-Capillary 121 (*)    All other components within normal limits  URINALYSIS, ROUTINE W REFLEX MICROSCOPIC  COMPREHENSIVE METABOLIC PANEL    Imaging Review No results found.   EKG Interpretation   Date/Time:  Friday May 06 2014 14:11:37 EDT Ventricular Rate:  75 PR Interval:  214 QRS Duration: 89 QT Interval:  417 QTC Calculation: 466 R Axis:   27 Text Interpretation:  Sinus rhythm Borderline prolonged PR interval  Confirmed by Adriana SimasOOK  MD, BRIAN ([id number]) on 05/06/2014 3:25:22 PM      MDM   Patient is an 80 y.o. Female who is presenting with her husband for increased  altered mental status.   CBC, CMP, UA, and CXR are ordered here in the ED.  Patient has recently had a Head CT performed in the last two week and there were no acute abnormalities at that time.  The  patient had no focal neuro deficits at this time.  There is no need to repeat scan at this time.  Labs are remarkable for negative UA for infection, resolving leukocytosis, and no electrolyte abnormality.  CXR is currently pending at this time.  Patient is currently scheduled for outpatient workup with psychiatry with Dr. Dellia Cloud.  I will sign the patient out with Dr. Effie Shy at this time.        Clydie Braun, PA-C 05/06/14 1633

## 2014-05-06 NOTE — Telephone Encounter (Signed)
Patient Information:  Caller Name: Reita ClicheBobby  Phone: [phone]  Patient: Paula [name], Paula [name]  Gender: Female  DOB: [birth year]  Age: 80 Years  PCP: Eustaquio BoydenGutierrez, Javier [facility](Family Practice)  Office Follow Up:  Does the office need to follow up with this patient?: Yes  Instructions For The Office: giving heads up about  symptoms - husband calling 911.   Symptoms  Reason For Call & Symptoms: Has been confused at times but very confused last night 6/25 - got up urinated while standing at side of bed, speech slurred, does not know her husband.  Today 6/26 c/o numbness in left leg, speech still slurred, still very confused.  Has no idea what her blood sugar is since he does not know how to work the glucometer and patient almost blind.  Spit out her medications several times this am.  Reviewed Health History In EMR: Yes  Reviewed Medications In EMR: Yes  Reviewed Allergies In EMR: Yes  Reviewed Surgeries / Procedures: Yes  Date of Onset of Symptoms: 05/05/2014  Guideline(s) Used:  Confusion - Delirium  Disposition Per Guideline:   Call EMS 911 Now  Reason For Disposition Reached:   Difficult to awaken or acting confused (disoriented, slurred speech) and diabetic  Advice Given:  N/A  Patient Will Follow Care Advice:  YES

## 2014-05-06 NOTE — ED Notes (Signed)
PA at the bedside.

## 2014-05-06 NOTE — ED Notes (Signed)
PA approved for patient to have something to drink. PA stated, "I am not worried about stroke." Patient given water. Husband at the bedside.

## 2014-05-06 NOTE — Discharge Instructions (Signed)
Dementia Dementia is a general term for problems with brain function. A person with dementia has memory loss and a hard time with at least one other brain function such as thinking, speaking, or problem solving. Dementia can affect social functioning, how you do your job, your mood, or your personality. The changes may be hidden for a long time. The earliest forms of this disease are usually not detected by family or friends. Dementia can be:  Irreversible.  Potentially reversible.  Partially reversible.  Progressive. This means it can get worse over time. CAUSES  Irreversible dementia causes may include:  Degeneration of brain cells (Alzheimer's disease or lewy body dementia).  Multiple small strokes (vascular dementia).  Infection (chronic meningitis or Creutzfelt-Jakob disease).  Frontotemporal dementia. This affects younger people, age 40 to 70, compared to those who have Alzheimer's disease.  Dementia associated with other disorders like Parkinson's disease, Huntington's disease, or HIV-associated dementia. Potentially or partially reversible dementia causes may include:  Medicines.  Metabolic causes such as excessive alcohol intake, vitamin B12 deficiency, or thyroid disease.  Masses or pressure in the brain such as a tumor, blood clot, or hydrocephalus. SYMPTOMS  Symptoms are often hard to detect. Family members or coworkers may not notice them early in the disease process. Different people with dementia may have different symptoms. Symptoms can include:  A hard time with memory, especially recent memory. Long-term memory may not be impaired.  Asking the same question multiple times or forgetting something someone just said.  A hard time speaking your thoughts or finding certain words.  A hard time solving problems or performing familiar tasks (such as how to use a telephone).  Sudden changes in mood.  Changes in personality, especially increasing moodiness or  mistrust.  Depression.  A hard time understanding complex ideas that were never a problem in the past. DIAGNOSIS  There are no specific tests for dementia.   Your caregiver may recommend a thorough evaluation. This is because some forms of dementia can be reversible. The evaluation will likely include a physical exam and getting a detailed history from you and a family member. The history often gives the best clues and suggestions for a diagnosis.  Memory testing may be done. A detailed brain function evaluation called neuropsychologic testing may be helpful.  Lab tests and brain imaging (such as a CT scan or MRI scan) are sometimes important.  Sometimes observation and re-evaluation over time is very helpful. TREATMENT  Treatment depends on the cause.   If the problem is a vitamin deficiency, it may be helped or cured with supplements.  For dementias such as Alzheimer's disease, medicines are available to stabilize or slow the course of the disease. There are no cures for this type of dementia.  Your caregiver can help direct you to groups, organizations, and other caregivers to help with decisions in the care of you or your loved one. HOME CARE INSTRUCTIONS The care of individuals with dementia is varied and dependent upon the progression of the dementia. The following suggestions are intended for the person living with, or caring for, the person with dementia.  Create a safe environment.  Remove the locks on bathroom doors to prevent the person from accidentally locking himself or herself in.  Use childproof latches on kitchen cabinets and any place where cleaning supplies, chemicals, or alcohol are kept.  Use childproof covers in unused electrical outlets.  Install childproof devices to keep doors and windows secured.  Remove stove knobs or install safety   knobs and an automatic shut-off on the stove.  Lower the temperature on water heaters.  Label medicines and keep them  locked up.  Secure knives, lighters, matches, power tools, and guns, and keep these items out of reach.  Keep the house free from clutter. Remove rugs or anything that might contribute to a fall.  Remove objects that might break and hurt the person.  Make sure lighting is good, both inside and outside.  Install grab rails as needed.  Use a monitoring device to alert you to falls or other needs for help.  Reduce confusion.  Keep familiar objects and people around.  Use night lights or dim lights at night.  Label items or areas.  Use reminders, notes, or directions for daily activities or tasks.  Keep a simple, consistent routine for waking, meals, bathing, dressing, and bedtime.  Create a calm, quiet environment.  Place large clocks and calendars prominently.  Display emergency numbers and home address near all telephones.  Use cues to establish different times of the day. An example is to open curtains to let the natural light in during the day.   Use effective communication.  Choose simple words and short sentences.  Use a gentle, calm tone of voice.  Be careful not to interrupt.  If the person is struggling to find a word or communicate a thought, try to provide the word or thought.  Ask one question at a time. Allow the person ample time to answer questions. Repeat the question again if the person does not respond.  Reduce nighttime restlessness.  Provide a comfortable bed.  Have a consistent nighttime routine.  Ensure a regular walking or physical activity schedule. Involve the person in daily activities as much as possible.  Limit napping during the day.  Limit caffeine.  Attend social events that stimulate rather than overwhelm the senses.  Encourage good nutrition and hydration.  Reduce distractions during meal times and snacks.  Avoid foods that are too hot or too cold.  Monitor chewing and swallowing ability.  Continue with routine vision,  hearing, dental, and medical screenings.  Only give over-the-counter or prescription medicines as directed by the caregiver.  Monitor driving abilities. Do not allow the person to drive when safe driving is no longer possible.  Register with an identification program which could provide location assistance in the event of a missing person situation. SEEK MEDICAL CARE IF:   New behavioral problems start such as moodiness, aggressiveness, or seeing things that are not there (hallucinations).  Any new problem with brain function happens. This includes problems with balance, speech, or falling a lot.  Problems with swallowing develop.  Any symptoms of other illness happen. Small changes or worsening in any aspect of brain function can be a sign that the illness is getting worse. It can also be a sign of another medical illness such as infection. Seeing a caregiver right away is important. SEEK IMMEDIATE MEDICAL CARE IF:   A fever develops.  New or worsened confusion develops.  New or worsened sleepiness develops.  Staying awake becomes hard to do. Document Released: 04/23/2001 Document Revised: 01/20/2012 Document Reviewed: 03/25/2011 ExitCare Patient Information 2015 [employer]. This information is not intended to replace advice given to you by your health care provider. Make sure you discuss any questions you have with your health care provider.  

## 2014-05-06 NOTE — ED Notes (Signed)
Notified RN of CBG 121

## 2014-05-06 NOTE — ED Provider Notes (Signed)
  Face-to-face evaluation   History: She presents for evaluation of confusion. The confusion is gradual and worsening. Today, she required treatment with Haldol and Versed by EMS, to convince her to come here. Currently, she is alert and communicative. She is paranoid and delusional. She is redirectable, and cooperative. When I speak with her.  Physical exam: Alert, elderly, female, in mild distress. Heart regular rhythm, no murmur. Lungs clear to auscultation Neurologic. No focal asymmetry no dysarthria, aphasia.  Medications - No data to display  Patient Vitals for the past 24 hrs:  BP Temp Temp src Pulse Resp SpO2  05/06/14 1700 163/78 mmHg - - 77 23 92 %  05/06/14 1645 148/69 mmHg - - 73 20 90 %  05/06/14 1635 151/62 mmHg - - 73 18 98 %  05/06/14 1600 161/69 mmHg - - 76 21 93 %  05/06/14 1530 156/60 mmHg - - 72 19 93 %  05/06/14 1515 146/74 mmHg - - 73 20 92 %  05/06/14 1445 145/67 mmHg - - 68 18 98 %  05/06/14 1415 138/64 mmHg - - 73 22 96 %  05/06/14 1401 - - - - - 94 %  05/06/14 1336 122/57 mmHg 98.3 F (36.8 C) Oral 71 16 88 %    5:41 PM Reevaluation with update and discussion. After initial assessment and treatment, an updated evaluation reveals she is comfortable and continues to be redirectable. Findings discussed with husband, he agrees with the treatment plan.Mancel Bale. WENTZ,ELLIOTT L     Assessment- likely senile dementia with aggressive behavior, and delusions. The patient has an upcoming appointment with a psychiatrist on 05/14/2014    Medical screening examination/treatment/procedure(s) were conducted as a shared visit with non-physician practitioner(s) and myself.  I personally evaluated the patient during the encounter   Nursing Notes Reviewed/ Care Coordinated Applicable Imaging Reviewed Interpretation of Laboratory Data incorporated into ED treatment  The patient appears reasonably screened and/or stabilized for discharge and I doubt any other medical condition or  other [facility] requiring further screening, evaluation, or treatment in the ED at this time prior to discharge.  Plan: Home Medications- Haldol; Home Treatments- rest; return here if the recommended treatment, does not improve the symptoms; Recommended follow up- f/u with Psychiatry as planned on 05/24/14. F/U PCP prn.  Flint MelterElliott L Wentz, MD 05/06/14 [phone]

## 2014-05-06 NOTE — ED Notes (Addendum)
Patient returned from Xray. NT Stephen at the bedside.

## 2014-05-06 NOTE — Telephone Encounter (Signed)
Agree with 911. Thanks.  Routed to PCP as FYI.

## 2014-05-09 ENCOUNTER — Ambulatory Visit: Payer: Medicare Other | Admitting: Podiatry

## 2014-05-11 ENCOUNTER — Ambulatory Visit: Payer: Medicare Other | Attending: Internal Medicine | Admitting: Physical Therapy

## 2014-05-11 DIAGNOSIS — IMO0001 Reserved for inherently not codable concepts without codable children: Secondary | ICD-10-CM | POA: Insufficient documentation

## 2014-05-11 DIAGNOSIS — M6281 Muscle weakness (generalized): Secondary | ICD-10-CM | POA: Insufficient documentation

## 2014-05-11 DIAGNOSIS — R269 Unspecified abnormalities of gait and mobility: Secondary | ICD-10-CM | POA: Insufficient documentation

## 2014-05-11 DIAGNOSIS — R5381 Other malaise: Secondary | ICD-10-CM | POA: Diagnosis not present

## 2014-05-12 ENCOUNTER — Encounter: Payer: Self-pay | Admitting: Family Medicine

## 2014-05-12 ENCOUNTER — Ambulatory Visit (INDEPENDENT_AMBULATORY_CARE_PROVIDER_SITE_OTHER): Payer: Medicare Other | Admitting: Family Medicine

## 2014-05-12 VITALS — BP 114/58 | HR 60 | Temp 98.3°F | Ht 65.0 in | Wt 177.8 lb

## 2014-05-12 DIAGNOSIS — J438 Other emphysema: Secondary | ICD-10-CM

## 2014-05-12 DIAGNOSIS — R059 Cough, unspecified: Secondary | ICD-10-CM

## 2014-05-12 DIAGNOSIS — G459 Transient cerebral ischemic attack, unspecified: Secondary | ICD-10-CM

## 2014-05-12 DIAGNOSIS — R05 Cough: Secondary | ICD-10-CM

## 2014-05-12 MED ORDER — BENZONATATE 100 MG PO CAPS: 200.0000 mg | ORAL_CAPSULE | Freq: Every evening | ORAL | Status: DC | PRN

## 2014-05-12 NOTE — Progress Notes (Signed)
   Subjective:    Patient ID: Paula LarsenSusan R Cleaver, female    DOB: 1927-08-25, 80 y.o.   MRN: [id number]  HPI  80 year old female pt of  Dr  G with history of COPD/emphesema seen multiple times recently and in ER twice in last 2 weeks for  mental status changes.  ER visit on 6/17  wbc 14.4 Completed course of levaquin.for UTI on 6/18 At 6/22 treated with tessalon perles  ER vist again on  6/26 for confusion ( had to be given haldol By EMS)  She was delusional and  paranoifd at the time.  Dx with senile dementia with aggressive behavoir.  She has psych appt on 7/4.  Given haldol to use. wbc at that visit 13.4, other labs normal.  CXR:  No PNA, ? Mild pulm edema   She comes today because of cough , now ongoing since 2-3 week, dry cough. Worse last night.  She has been wheezing some, fairly stable per husband.  No SOB. No fever. She has some runny nose.  On allegra and flonase.  HX of longterm smoking, but none current.    Review of Systems  Constitutional: Negative for fever and fatigue.  HENT: Negative for ear pain.   Eyes: Negative for pain.  Respiratory: Negative for chest tightness and shortness of breath.   Cardiovascular: Negative for chest pain, palpitations and leg swelling.  Gastrointestinal: Negative for abdominal pain.  Genitourinary: Negative for dysuria.       Objective:   Physical Exam  Constitutional: Vital signs are normal. She appears well-developed and well-nourished. She is cooperative.  Non-toxic appearance. She does not appear ill. No distress.  HENT:  Head: Normocephalic.  Right Ear: Hearing, tympanic membrane, external ear and ear canal normal. Tympanic membrane is not erythematous, not retracted and not bulging.  Left Ear: Hearing, tympanic membrane, external ear and ear canal normal. Tympanic membrane is not erythematous, not retracted and not bulging.  Nose: No mucosal edema or rhinorrhea. Right sinus exhibits no maxillary sinus tenderness and no frontal  sinus tenderness. Left sinus exhibits no maxillary sinus tenderness and no frontal sinus tenderness.  Mouth/Throat: Uvula is midline, oropharynx is clear and moist and mucous membranes are normal.  Eyes: Conjunctivae, EOM and lids are normal. Pupils are equal, round, and reactive to light. Lids are everted and swept, no foreign bodies found.  Neck: Trachea normal and normal range of motion. Neck supple. Carotid bruit is not present. No mass and no thyromegaly present.  Cardiovascular: Normal rate, regular rhythm, S1 normal, S2 normal, normal heart sounds, intact distal pulses and normal pulses.  Exam reveals no gallop and no friction rub.   No murmur heard. Pulmonary/Chest: Effort normal. Not tachypneic. No respiratory distress. She has no decreased breath sounds. She has no wheezes. She has no rhonchi. She has no rales.  Frequent cough  Abdominal: Soft. Normal appearance and bowel sounds are normal. There is no tenderness.  Neurological: She is alert.  Skin: Skin is warm, dry and intact. No rash noted.  Psychiatric: Her speech is normal and behavior is normal. Judgment and thought content normal. Her mood appears not anxious. Cognition and memory are normal. She does not exhibit a depressed mood.          Assessment & Plan:

## 2014-05-12 NOTE — Progress Notes (Signed)
Pre visit review using our clinic review tool, if applicable. No additional management support is needed unless otherwise documented below in the visit note. 

## 2014-05-12 NOTE — Patient Instructions (Addendum)
Make sure taking allegra ( fexofenadine) Flonase, now increase to 2 sprays per day. Can use tessalon perles as needed. Follow up with PCP in next 2 weeks, earlier if not getting better.

## 2014-05-16 ENCOUNTER — Encounter: Payer: Self-pay | Admitting: Podiatry

## 2014-05-16 ENCOUNTER — Ambulatory Visit (INDEPENDENT_AMBULATORY_CARE_PROVIDER_SITE_OTHER): Payer: Medicare Other | Admitting: Podiatry

## 2014-05-16 VITALS — BP 114/60 | HR 84 | Resp 12

## 2014-05-16 DIAGNOSIS — M79609 Pain in unspecified limb: Secondary | ICD-10-CM

## 2014-05-16 DIAGNOSIS — M79673 Pain in unspecified foot: Secondary | ICD-10-CM

## 2014-05-16 DIAGNOSIS — B351 Tinea unguium: Secondary | ICD-10-CM

## 2014-05-17 ENCOUNTER — Ambulatory Visit: Payer: Medicare Other | Admitting: Family Medicine

## 2014-05-17 ENCOUNTER — Encounter: Payer: Self-pay | Admitting: Family Medicine

## 2014-05-17 NOTE — Progress Notes (Signed)
Patient ID: Paula LarsenSusan R [name], female   DOB: 02-25-1927, 80 y.o.   MRN: [id number]  Subjective: This patient presents complaining of painful toenails on the right and left feet. Her husband is present today  Objective: Elongated, hypertrophic, brittle, incurvated toenails x10  Assessment: Symptomatic onychomycoses 6-10  Plan: Debrided toenails 6-10 without a bleeding  Reappoint x3 months

## 2014-05-19 ENCOUNTER — Encounter: Payer: Self-pay | Admitting: Family Medicine

## 2014-05-23 ENCOUNTER — Ambulatory Visit (INDEPENDENT_AMBULATORY_CARE_PROVIDER_SITE_OTHER): Payer: Medicare Other | Admitting: Psychology

## 2014-05-23 DIAGNOSIS — F4320 Adjustment disorder, unspecified: Secondary | ICD-10-CM

## 2014-05-24 ENCOUNTER — Ambulatory Visit: Payer: Medicare Other | Admitting: Psychology

## 2014-05-25 ENCOUNTER — Ambulatory Visit: Payer: Medicare Other

## 2014-05-25 ENCOUNTER — Telehealth: Payer: Self-pay | Admitting: *Deleted

## 2014-05-25 NOTE — Telephone Encounter (Signed)
Form for diabetic testing supplies in your IN box for completion. I didn't see where/how she tested.

## 2014-05-26 ENCOUNTER — Ambulatory Visit (INDEPENDENT_AMBULATORY_CARE_PROVIDER_SITE_OTHER): Payer: Medicare Other | Admitting: Family Medicine

## 2014-05-26 ENCOUNTER — Encounter: Payer: Self-pay | Admitting: Family Medicine

## 2014-05-26 VITALS — BP 142/76 | HR 76 | Temp 98.2°F | Wt 179.0 lb

## 2014-05-26 DIAGNOSIS — I798 Other disorders of arteries, arterioles and capillaries in diseases classified elsewhere: Secondary | ICD-10-CM

## 2014-05-26 DIAGNOSIS — E1159 Type 2 diabetes mellitus with other circulatory complications: Secondary | ICD-10-CM

## 2014-05-26 DIAGNOSIS — R059 Cough, unspecified: Secondary | ICD-10-CM

## 2014-05-26 DIAGNOSIS — F0390 Unspecified dementia without behavioral disturbance: Secondary | ICD-10-CM

## 2014-05-26 DIAGNOSIS — G459 Transient cerebral ischemic attack, unspecified: Secondary | ICD-10-CM

## 2014-05-26 DIAGNOSIS — E1151 Type 2 diabetes mellitus with diabetic peripheral angiopathy without gangrene: Secondary | ICD-10-CM

## 2014-05-26 DIAGNOSIS — E538 Deficiency of other specified B group vitamins: Secondary | ICD-10-CM

## 2014-05-26 DIAGNOSIS — F068 Other specified mental disorders due to known physiological condition: Secondary | ICD-10-CM

## 2014-05-26 DIAGNOSIS — R05 Cough: Secondary | ICD-10-CM

## 2014-05-26 MED ORDER — DONEPEZIL HCL 5 MG PO TABS: 5.0000 mg | ORAL_TABLET | Freq: Every day | ORAL | Status: DC

## 2014-05-26 MED ORDER — CYANOCOBALAMIN 1000 MCG/ML IJ SOLN
1000.0000 ug | Freq: Once | INTRAMUSCULAR | Status: AC
  Administered 2014-05-26: 1000 ug via INTRAMUSCULAR

## 2014-05-26 NOTE — Progress Notes (Signed)
BP 142/76  Pulse 76  Temp(Src) 98.2 F (36.8 C) (Oral)  Wt 179 lb (81.194 kg)   CC: 2 wk f/u  Subjective:    Patient ID: Paula [name], female    DOB: 23-Apr-1927, 80 y.o.   MRN: [id number]  HPI: Paula [name] is a 80 y.o. female presenting on 05/26/2014 for Geriatric Assessment   Since last seen by myself, seen again at ER with confusion and psychosis, thought related to dementia. Had been recommended she establish with psychiatrist but pt and husband are unsure if they have seen a psychiatrist.   Also saw dr. Ermalene Searing with cough, rec continue allegra. Cough has suddenly improved.  Sees PT - last session was 2 days ago, wearing her out but enjoys going.  Social worker going to home, discussing PACE. MMSE 02/2014 by Dr. Alwyn Ren 15/30. Sister with alzheimer's disease and parkinson's disease.  Denies personal history of tremors. Walks with walker regularly. Working with PT for balance training.  Geriatric Assessment: Activities of Daily Living:     Bathing- independent    Dressing- partially dependent to put shoes socks and pants on    Eating- independent    Toileting- independent     Transferring- partially dependent     Continence- partially dependent (occasionally wears depens) Overall Assessment: Independent  Instrumental Activities of Daily Living:     Transportation- dependent    Meal/Food Preparation- dependent    Shopping Errands- dependent    Housekeeping/Chores- dependent    Money Management/Finances- dependent    Medication Management- dependent    Ability to Use Telephone- dependent    Laundry- dependent Overall Assessment: Completely dependent   DM - regularly does not check sugars due to trouble with vision.  Compliant with antihyperglycemic regimen which includes: metformin 250mg  XL once daily.  Denies low sugars or hypoglycemic symptoms.  Denies paresthesias. Last diabetic eye exam DUE - f/u Sept.  Pneumovax: 10/2012.  Prevnar: not done yet. H/o blindness  from macular degeneration.  Relevant past medical, surgical, family and social history reviewed and updated as indicated.  Allergies and medications reviewed and updated. Current Outpatient Prescriptions on File Prior to Visit  Medication Sig  . acetaminophen (TYLENOL) 500 MG tablet Take 500 mg by mouth every 6 (six) hours as needed (pain).  Marland Kitchen albuterol (PROVENTIL HFA;VENTOLIN HFA) 108 (90 BASE) MCG/ACT inhaler Inhale 2 puffs into the lungs every 6 (six) hours as needed for wheezing or shortness of breath.  . ALPRAZolam (XANAX) 0.5 MG tablet Take 1 tablet (0.5 mg total) by mouth 3 (three) times daily.  Marland Kitchen aspirin 325 MG tablet Take 325 mg by mouth daily.  . baclofen (LIORESAL) 10 MG tablet Take 10 mg by mouth 3 (three) times daily as needed for muscle spasms.   . benzonatate (TESSALON) 100 MG capsule Take 2 capsules (200 mg total) by mouth at bedtime as needed for cough.  . calcium carbonate (OS-CAL) 600 MG TABS Take 600 mg by mouth every other day.   . carvedilol (COREG) 6.25 MG tablet Take 6.25 mg by mouth 2 (two) times daily with a meal.  . Cholecalciferol (VITAMIN D3) 5000 UNITS TABS Take 5,000 Units by mouth every other day.   . cyanocobalamin 500 MCG tablet Take 500 mcg by mouth daily.  . fexofenadine (ALLEGRA) 180 MG tablet Take 1 tablet (180 mg total) by mouth daily.  . fluticasone (FLONASE) 50 MCG/ACT nasal spray Place 1 spray into both nostrils daily.  . furosemide (LASIX) 20 MG tablet Take 20  mg by mouth every morning.   . gabapentin (NEURONTIN) 100 MG capsule Take 100-200 mg by mouth 3 (three) times daily. Take 1 capsule (100mg ) in the morning, 1 capsule (100mg ) in the afternoon, and 2 capsules (200mg ) at night.  . haloperidol (HALDOL) 0.5 MG tablet Take 1 tablet (0.5 mg total) by mouth 3 (three) times daily.  Marland Kitchen. levothyroxine (SYNTHROID, LEVOTHROID) 175 MCG tablet Take 87.5-175 mcg by mouth daily before breakfast. Take 1 tab (175mcg) on Sunday, Monday, Wednesday, Friday, and Saturday.  Take 0.5 tab (87.295mcg) on Tuesday and Thursday.  . losartan-hydrochlorothiazide (HYZAAR) 50-12.5 MG per tablet Take 1 tablet by mouth daily.  . metFORMIN (GLUCOPHAGE-XR) 500 MG 24 hr tablet Take 250 mg by mouth at bedtime.  . ondansetron (ZOFRAN) 4 MG tablet Take 4 mg by mouth every 8 (eight) hours as needed for nausea or vomiting.  . pantoprazole (PROTONIX) 40 MG tablet Take 40 mg by mouth daily.  Bertram Gala. Polyethyl Glycol-Propyl Glycol (SYSTANE) 0.4-0.3 % SOLN Apply 1 drop to eye 2 (two) times daily as needed (dry eyes).  . potassium chloride SA (K-DUR,KLOR-CON) 20 MEQ tablet Take 1 tablet by mouth  daily  . sertraline (ZOLOFT) 50 MG tablet Take 50 mg by mouth daily.   No current facility-administered medications on file prior to visit.    Review of Systems Per HPI unless specifically indicated above    Objective:    BP 142/76  Pulse 76  Temp(Src) 98.2 F (36.8 C) (Oral)  Wt 179 lb (81.194 kg)  Physical Exam  Vitals reviewed. Constitutional: She appears well-developed and well-nourished. No distress.  HENT:  Mouth/Throat: Oropharynx is clear and moist. No oropharyngeal exudate.  Eyes: Conjunctivae and EOM are normal. Pupils are equal, round, and reactive to light.  Cardiovascular: Normal rate, regular rhythm, normal heart sounds and intact distal pulses.   No murmur heard. Pulmonary/Chest: Effort normal and breath sounds normal. No respiratory distress. She has no wheezes. She has no rales.  Musculoskeletal: She exhibits no edema.  Diabetic foot exam: Normal inspection No skin breakdown No calluses  Normal DP pulses Normal sensation to light touch and monofilament Nails normal       Assessment & Plan:   Problem List Items Addressed This Visit   Type 2 diabetes, controlled, with peripheral circulatory disorder     Foot exam today. Continue metformin. A1c controlled for her age and comorbidities - goal <8%.    Relevant Orders      HM DIABETES FOOT EXAM (Completed)    Dementia arising in the senium and presenium - Primary     H/o dementia with some psychotic features. Never established with psychiatrist. Will try aricept 5mg  daily, and monitor for effect. MRI with chronic small vessel ischemic changes and gen atrophy. MMSR 02/2014 - 15/30. Independent with ADLs Completely dependent on IADLs    Relevant Medications      donepezil (ARICEPT) tablet   Cough     Currently improved.        Follow up plan: Return in about 1 month (around 06/26/2014), or as needed, for follow up visit.

## 2014-05-26 NOTE — Telephone Encounter (Signed)
filled and placed in Kim's box. 

## 2014-05-26 NOTE — Patient Instructions (Signed)
Check with pharmacist about glucose meter that says sugar level out loud. Start aricept 5mg  once daily. Keep appoointment with eye doctor. Good to see you today, call us with questions. Return to see me in 1 month for follow up.

## 2014-05-26 NOTE — Assessment & Plan Note (Signed)
Currently improved.  

## 2014-05-26 NOTE — Assessment & Plan Note (Signed)
Foot exam today. Continue metformin. A1c controlled for her age and comorbidities - goal <8%.

## 2014-05-26 NOTE — Addendum Note (Signed)
Addended by: Josph MachoANCE, KIMBERLY A on: 05/26/2014 12:18 PM   Modules accepted: Orders

## 2014-05-26 NOTE — Progress Notes (Signed)
Pre visit review using our clinic review tool, if applicable. No additional management support is needed unless otherwise documented below in the visit note. 

## 2014-05-26 NOTE — Assessment & Plan Note (Signed)
H/o dementia with some psychotic features. Never established with psychiatrist. Will try aricept 5mg  daily, and monitor for effect. MRI with chronic small vessel ischemic changes and gen atrophy. MMSR 02/2014 - 15/30. Independent with ADLs Completely dependent on IADLs

## 2014-05-26 NOTE — Telephone Encounter (Signed)
Form faxed

## 2014-06-02 NOTE — Assessment & Plan Note (Signed)
Make sure taking allegra ( fexofenadine) Flonase, now increase to 2 sprays per day. Can use tessalon perles as needed.

## 2014-06-03 ENCOUNTER — Encounter: Payer: Self-pay | Admitting: Family Medicine

## 2014-06-03 ENCOUNTER — Ambulatory Visit (INDEPENDENT_AMBULATORY_CARE_PROVIDER_SITE_OTHER): Payer: Medicare Other | Admitting: Family Medicine

## 2014-06-03 VITALS — BP 138/60 | HR 69 | Temp 98.9°F | Wt 177.0 lb

## 2014-06-03 DIAGNOSIS — R112 Nausea with vomiting, unspecified: Secondary | ICD-10-CM

## 2014-06-03 DIAGNOSIS — G459 Transient cerebral ischemic attack, unspecified: Secondary | ICD-10-CM

## 2014-06-03 NOTE — Progress Notes (Signed)
Pre visit review using our clinic review tool, if applicable. No additional management support is needed unless otherwise documented below in the visit note. 

## 2014-06-03 NOTE — Assessment & Plan Note (Signed)
Presumed due to aricept - now off med. Will not restart these meds for now, consider readdressing in future.

## 2014-06-03 NOTE — Patient Instructions (Signed)
I think this nausea was due to aricept. Let's stop med and let me know if nausea or vomiting persists.

## 2014-06-03 NOTE — Progress Notes (Signed)
BP 138/60  Pulse 69  Temp(Src) 98.9 F (37.2 C) (Tympanic)  Wt 177 lb (80.287 kg)  SpO2 96%   CC: dizziness, nausea/vomiting  Subjective:    Patient ID: Paula LarsenSusan R [name], female    DOB: January 25, 1927, 80 y.o.   MRN: [id number]  HPI: Paula [name] is a 80 y.o. female presenting on 06/03/2014 for Dizziness   Paula [name] presents today for acute visit of dizziness and nausea/vomiting that started today.   Recent new medication (started 05/26/2014) was aricept (donepezil) 5mg  daily. She took this for 9 days. Today started feeling nauseated, vomited x1.  No diarrhea or fevers/chills.  Currently feeling better.  Wt Readings from Last 3 Encounters:  06/03/14 177 lb (80.287 kg)  05/26/14 179 lb (81.194 kg)  05/12/14 177 lb 12 oz (80.627 kg)   Body mass index is 29.45 kg/(m^2).  Relevant past medical, surgical, family and social history reviewed and updated as indicated.  Allergies and medications reviewed and updated. Current Outpatient Prescriptions on File Prior to Visit  Medication Sig  . acetaminophen (TYLENOL) 500 MG tablet Take 500 mg by mouth every 6 (six) hours as needed (pain).  Marland Kitchen. albuterol (PROVENTIL HFA;VENTOLIN HFA) 108 (90 BASE) MCG/ACT inhaler Inhale 2 puffs into the lungs every 6 (six) hours as needed for wheezing or shortness of breath.  . ALPRAZolam (XANAX) 0.5 MG tablet Take 1 tablet (0.5 mg total) by mouth 3 (three) times daily.  Marland Kitchen. aspirin 325 MG tablet Take 325 mg by mouth daily.  . baclofen (LIORESAL) 10 MG tablet Take 10 mg by mouth 3 (three) times daily as needed for muscle spasms.   . benzonatate (TESSALON) 100 MG capsule Take 2 capsules (200 mg total) by mouth at bedtime as needed for cough.  . calcium carbonate (OS-CAL) 600 MG TABS Take 600 mg by mouth every other day.   . carvedilol (COREG) 6.25 MG tablet Take 6.25 mg by mouth 2 (two) times daily with a meal.  . Cholecalciferol (VITAMIN D3) 5000 UNITS TABS Take 5,000 Units by mouth every other day.   .  cyanocobalamin 500 MCG tablet Take 500 mcg by mouth daily.  . fexofenadine (ALLEGRA) 180 MG tablet Take 1 tablet (180 mg total) by mouth daily.  . fluticasone (FLONASE) 50 MCG/ACT nasal spray Place 1 spray into both nostrils daily.  . furosemide (LASIX) 20 MG tablet Take 20 mg by mouth every morning.   . gabapentin (NEURONTIN) 100 MG capsule Take 100-200 mg by mouth 3 (three) times daily. Take 1 capsule (100mg ) in the morning, 1 capsule (100mg ) in the afternoon, and 2 capsules (200mg ) at night.  . haloperidol (HALDOL) 0.5 MG tablet Take 1 tablet (0.5 mg total) by mouth 3 (three) times daily.  Marland Kitchen. levothyroxine (SYNTHROID, LEVOTHROID) 175 MCG tablet Take 87.5-175 mcg by mouth daily before breakfast. Take 1 tab (175mcg) on Sunday, Monday, Wednesday, Friday, and Saturday. Take 0.5 tab (87.635mcg) on Tuesday and Thursday.  . losartan-hydrochlorothiazide (HYZAAR) 50-12.5 MG per tablet Take 1 tablet by mouth daily.  . metFORMIN (GLUCOPHAGE-XR) 500 MG 24 hr tablet Take 250 mg by mouth at bedtime.  . ondansetron (ZOFRAN) 4 MG tablet Take 4 mg by mouth every 8 (eight) hours as needed for nausea or vomiting.  . pantoprazole (PROTONIX) 40 MG tablet Take 40 mg by mouth daily.  Bertram Gala. Polyethyl Glycol-Propyl Glycol (SYSTANE) 0.4-0.3 % SOLN Apply 1 drop to eye 2 (two) times daily as needed (dry eyes).  . potassium chloride SA (K-DUR,KLOR-CON) 20 MEQ tablet Take  1 tablet by mouth  daily  . sertraline (ZOLOFT) 50 MG tablet Take 50 mg by mouth daily.   No current facility-administered medications on file prior to visit.    Review of Systems Per HPI unless specifically indicated above    Objective:    BP 138/60  Pulse 69  Temp(Src) 98.9 F (37.2 C) (Tympanic)  Wt 177 lb (80.287 kg)  SpO2 96%  Physical Exam  Nursing note and vitals reviewed. Constitutional: She appears well-developed and well-nourished. No distress.  HENT:  Mouth/Throat: Oropharynx is clear and moist. No oropharyngeal exudate.  Cardiovascular:  Normal rate, regular rhythm, normal heart sounds and intact distal pulses.   No murmur heard. Musculoskeletal: She exhibits no edema.  Skin: Skin is warm and dry. No rash noted.  Psychiatric: She has a normal mood and affect.       Assessment & Plan:   Problem List Items Addressed This Visit   Nausea with vomiting - Primary     Presumed due to aricept - now off med. Will not restart these meds for now, consider readdressing in future.        Follow up plan: Return for previously shceduled appointment.

## 2014-06-06 ENCOUNTER — Ambulatory Visit (INDEPENDENT_AMBULATORY_CARE_PROVIDER_SITE_OTHER): Payer: Medicare Other | Admitting: Internal Medicine

## 2014-06-06 ENCOUNTER — Encounter: Payer: Self-pay | Admitting: Internal Medicine

## 2014-06-06 VITALS — BP 114/64 | HR 71 | Temp 98.0°F | Wt 176.0 lb

## 2014-06-06 DIAGNOSIS — G459 Transient cerebral ischemic attack, unspecified: Secondary | ICD-10-CM

## 2014-06-06 DIAGNOSIS — R5381 Other malaise: Secondary | ICD-10-CM

## 2014-06-06 DIAGNOSIS — R300 Dysuria: Secondary | ICD-10-CM

## 2014-06-06 DIAGNOSIS — R5383 Other fatigue: Principal | ICD-10-CM

## 2014-06-06 LAB — POCT URINALYSIS DIPSTICK
BILIRUBIN UA: NEGATIVE
GLUCOSE UA: NEGATIVE
KETONES UA: NEGATIVE
Leukocytes, UA: NEGATIVE
Nitrite, UA: NEGATIVE
Protein, UA: NEGATIVE
RBC UA: NEGATIVE
SPEC GRAV UA: 1.015
Urobilinogen, UA: 0.2
pH, UA: 6

## 2014-06-06 NOTE — Addendum Note (Signed)
Addended by: Roena MaladyEVONTENNO, MELANIE Y on: 06/06/2014 04:27 PM   Modules accepted: Orders

## 2014-06-06 NOTE — Patient Instructions (Addendum)

## 2014-06-06 NOTE — Progress Notes (Signed)
Subjective:    Patient ID: Paula LarsenSusan R [name], female    DOB: 02/23/1927, 80 y.o.   MRN: [id number]  HPI  Pt presents to the clinic today with c/o "not feeling well" This started over the weekend. She denies dizziness, nausea or vomiting. She was started on aricept 7/17. She started feeling ill a few days after this. She has stopped the medication. She does report some stinging with urination. She has not been running any fevers.  Review of Systems  Past Medical History  Diagnosis Date  . Depression   . Ejection fraction     EF 40%, echo, November, 2012  / in improved, EF 60%, echo, December, 2012  . Contrast media allergy     Patient feels poorly with contrast  . Status post AAA (abdominal aortic aneurysm) repair     Surgical repair, Dr. Hart RochesterLawson, December 27, 2011  . Pre-syncope     vasovagal w/ heart block  . Parotid mass     has refused further eval 10/2011  . GERD (gastroesophageal reflux disease)   . Anxiety   . Facial tic     L sided spasms - Botox trial summer 2013, ?effective  . Macular degeneration     bilateral vision loss  . AAA (abdominal aortic aneurysm)     s/p repair 12/2011  . Congestive heart failure     NL EF 10/2011 echo   . Diabetes mellitus type II, controlled   . Hypothyroidism   . Sinus bradycardia 09/19/2011    Occurring simultaneously with complete heart block   . Hypertension   . COPD (chronic obstructive pulmonary disease)   . 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 Dementia     MRI with progressive chronic microvascular ischemia and generalized cerebral atrophy  . Lumbosacral spondylosis without myelopathy     Current Outpatient Prescriptions  Medication Sig Dispense Refill  . acetaminophen (TYLENOL) 500 MG tablet Take 500 mg by mouth every 6 (six) hours as needed (pain).      Marland Kitchen. albuterol (PROVENTIL HFA;VENTOLIN HFA) 108 (90 BASE) MCG/ACT inhaler Inhale 2 puffs into the lungs every 6 (six) hours as needed for wheezing or  shortness of breath.  3 Inhaler  0  . ALPRAZolam (XANAX) 0.5 MG tablet Take 1 tablet (0.5 mg total) by mouth 3 (three) times daily.  90 tablet  5  . aspirin 325 MG tablet Take 325 mg by mouth daily.      . baclofen (LIORESAL) 10 MG tablet Take 10 mg by mouth 3 (three) times daily as needed for muscle spasms.       . benzonatate (TESSALON) 100 MG capsule Take 2 capsules (200 mg total) by mouth at bedtime as needed for cough.  30 capsule  0  . calcium carbonate (OS-CAL) 600 MG TABS Take 600 mg by mouth every other day.       . carvedilol (COREG) 6.25 MG tablet Take 6.25 mg by mouth 2 (two) times daily with a meal.      . Cholecalciferol (VITAMIN D3) 5000 UNITS TABS Take 5,000 Units by mouth every other day.       . cyanocobalamin 500 MCG tablet Take 500 mcg by mouth daily.      . fexofenadine (ALLEGRA) 180 MG tablet Take 1 tablet (180 mg total) by mouth daily.  30 tablet  3  . fluticasone (FLONASE) 50 MCG/ACT nasal spray Place 1 spray into both nostrils daily.  16  g  3  . furosemide (LASIX) 20 MG tablet Take 20 mg by mouth every morning.       . gabapentin (NEURONTIN) 100 MG capsule Take 100-200 mg by mouth 3 (three) times daily. Take 1 capsule (100mg ) in the morning, 1 capsule (100mg ) in the afternoon, and 2 capsules (200mg ) at night.      . haloperidol (HALDOL) 0.5 MG tablet Take 1 tablet (0.5 mg total) by mouth 3 (three) times daily.  100 tablet  0  . levothyroxine (SYNTHROID, LEVOTHROID) 175 MCG tablet Take 87.5-175 mcg by mouth daily before breakfast. Take 1 tab ( ) on Sunday, Monday, Wednesday, Friday, and Saturday. Take 0.5 tab (87.22mcg) on Tuesday and Thursday.      . losartan-hydrochlorothiazide (HYZAAR) 50-12.5 MG per tablet Take 1 tablet by mouth daily.  90 tablet  1  . metFORMIN (GLUCOPHAGE-XR) 500 MG 24 hr tablet Take 250 mg by mouth at bedtime.      . ondansetron (ZOFRAN) 4 MG tablet Take 4 mg by mouth every 8 (eight) hours as needed for nausea or vomiting.      . pantoprazole  (PROTONIX) 40 MG tablet Take 40 mg by mouth daily.      Bertram Gala Glycol-Propyl Glycol (SYSTANE) 0.4-0.3 % SOLN Apply 1 drop to eye 2 (two) times daily as needed (dry eyes).      . potassium chloride SA (K-DUR,KLOR-CON) 20 MEQ tablet Take 1 tablet by mouth  daily  90 tablet  3  . sertraline (ZOLOFT) 50 MG tablet Take 50 mg by mouth daily.       No current facility-administered medications for this visit.    Allergies  Allergen Reactions  . Bactrim [Sulfamethoxazole-Tmp Ds] Anaphylaxis  . Contrast Media [Iodinated Diagnostic Agents] Anaphylaxis  . Gadolinium Derivatives Other (See Comments)    Unknown   . Iohexol Anaphylaxis, Shortness Of Breath and Swelling     Desc: PT STATES SHE HAD A SEVERE REACTION TO IV CONRAST WITH THROAT SWELLING AND SOB. SHE WAS ADMITTED TO THE HOSPITAL. SHE HAS NEVER HAD CONTRAST AGAIN.   Marland Kitchen Penicillins Rash  . Sulfa Antibiotics Anaphylaxis  . Avelox [Moxifloxacin Hcl In Nacl] Rash  . Aricept [Donepezil Hcl] Nausea And Vomiting  . Ciprofloxacin Itching  . Cymbalta [Duloxetine Hcl] Other (See Comments)    HALLUCINATIONS   . Delsym [Dextromethorphan] Other (See Comments)    Hallucination  . Hydromet [Hydrocodone-Homatropine] Other (See Comments)    Pt states med make her hallucinate    Family History  Problem Relation Age of Onset  . Esophageal cancer Father   . Cancer Father   . Breast cancer Sister   . Cancer Sister   . Colon cancer Sister   . Heart disease Sister   . Heart disease Mother   . Heart disease Son   . Parkinson's disease Sister     younger    History   Social History  . Marital Status: Married    Spouse Name: [name]    Number of Children: 4  . Years of Education: 9th   Occupational History  . retired Other    worked for 25 years, hairdresser   Social History Main Topics  . Smoking status: Former Smoker -- 1.00 packs/day for 60 years    Types: Cigarettes    Quit date: 08/31/2011  . Smokeless tobacco: Never Used  .  Alcohol Use: No  . Drug Use: No  . Sexual Activity: Not on file   Other Topics Concern  . Not  on file   Social History Narrative   Patient is married Archivist) and lives at home with her husband.   Patient worked as a Interior and spatial designer for 25 years.   Patient drinks a caffeine beverage daily.   Patient is right-handed.   Patient has four children, three are deceased and one child is living.     Constitutional: Pt reports fatigue. Denies fever, malaise, headache or abrupt weight changes.  Respiratory: Denies difficulty breathing, shortness of breath, cough or sputum production.   Cardiovascular: Denies chest pain, chest tightness, palpitations or swelling in the hands or feet.  Gastrointestinal: Denies abdominal pain, bloating, constipation, diarrhea or blood in the stool.   No other specific complaints in a complete review of systems (except as listed in HPI above).     Objective:   Physical Exam  BP 114/64  Pulse 71  Temp(Src) 98 F (36.7 C) (Oral)  Wt 176 lb (79.833 kg)  SpO2 97% Wt Readings from Last 3 Encounters:  06/06/14 176 lb (79.833 kg)  06/03/14 177 lb (80.287 kg)  05/26/14 179 lb (81.194 kg)    General: Appears her stated age, chronically ill appearing in NAD. Skin: Warm, dry and intact. No rashes, lesions or ulcerations noted. Cardiovascular: Normal rate and rhythm. S1,S2 noted.  No murmur, rubs or gallops noted. No JVD or BLE edema. No carotid bruits noted. Pulmonary/Chest: Normal effort and positive vesicular breath sounds. No respiratory distress. No wheezes, rales or ronchi noted.  Abdomen: Soft and nontender. Normal bowel sounds, no bruits noted. No distention or masses noted. Liver, spleen and kidneys non palpable.   BMET    Component Value Date/Time   NA 140 05/06/2014 1519   K 3.7 05/06/2014 1519   CL 98 05/06/2014 1519   CO2 28 05/06/2014 1519   GLUCOSE 114* 05/06/2014 1519   BUN 18 05/06/2014 1519   CREATININE 0.92 05/06/2014 1519   CREATININE 0.80  12/23/2011 0824   CALCIUM 9.9 05/06/2014 1519   GFRNONAA 55* 05/06/2014 1519   GFRAA 64* 05/06/2014 1519    Lipid Panel     Component Value Date/Time   CHOL 153 11/07/2013 0845   TRIG 282* 11/07/2013 0845   HDL 28* 11/07/2013 0845   CHOLHDL 5.5 11/07/2013 0845   VLDL 56* 11/07/2013 0845   LDLCALC 69 11/07/2013 0845    CBC    Component Value Date/Time   WBC 13.4* 05/06/2014 1519   RBC 3.33* 05/06/2014 1519   HGB 11.1* 05/06/2014 1519   HCT 34.1* 05/06/2014 1519   PLT 298 05/06/2014 1519   MCV 102.4* 05/06/2014 1519   MCH 33.3 05/06/2014 1519   MCHC 32.6 05/06/2014 1519   RDW 14.9 05/06/2014 1519   LYMPHSABS 2.9 04/27/2014 1146   MONOABS 0.8 04/27/2014 1146   EOSABS 0.3 04/27/2014 1146   BASOSABS 0.1 04/27/2014 1146    Hgb A1C Lab Results  Component Value Date   HGBA1C 7.4* 02/02/2014         Assessment & Plan:   Dysuria:  Urinalysis: normal Vital signs are normal No s/s of infection noted on exam today  RTC as needed or if symptoms persist or worsen

## 2014-06-06 NOTE — Progress Notes (Signed)
Pre visit review using our clinic review tool, if applicable. No additional management support is needed unless otherwise documented below in the visit note. 

## 2014-06-11 DIAGNOSIS — H3530 Unspecified macular degeneration: Secondary | ICD-10-CM

## 2014-06-11 DIAGNOSIS — H548 Legal blindness, as defined in USA: Secondary | ICD-10-CM

## 2014-06-11 HISTORY — DX: Unspecified macular degeneration: H35.30

## 2014-06-11 HISTORY — DX: Legal blindness, as defined in USA: H54.8

## 2014-06-16 ENCOUNTER — Telehealth: Payer: Self-pay

## 2014-06-16 NOTE — Telephone Encounter (Signed)
Noted  

## 2014-06-16 NOTE — Telephone Encounter (Signed)
Mr Paula [name] left v/m; [facility] diabetic meter and supplies. Mr Paula [name] request if do not get request by next week to call [facility] at [phone]. Mr Paula [name] request cb after completion.

## 2014-06-17 LAB — HM DIABETES EYE EXAM

## 2014-06-17 NOTE — Telephone Encounter (Signed)
Form completed and faxed. 

## 2014-06-20 ENCOUNTER — Encounter: Payer: Self-pay | Admitting: Family Medicine

## 2014-06-20 ENCOUNTER — Ambulatory Visit (INDEPENDENT_AMBULATORY_CARE_PROVIDER_SITE_OTHER): Payer: Medicare Other | Admitting: Family Medicine

## 2014-06-20 VITALS — BP 132/64 | HR 80 | Temp 98.6°F | Wt 182.0 lb

## 2014-06-20 DIAGNOSIS — F0390 Unspecified dementia without behavioral disturbance: Secondary | ICD-10-CM

## 2014-06-20 DIAGNOSIS — R059 Cough, unspecified: Secondary | ICD-10-CM

## 2014-06-20 DIAGNOSIS — F068 Other specified mental disorders due to known physiological condition: Secondary | ICD-10-CM

## 2014-06-20 DIAGNOSIS — S2341XA Sprain of ribs, initial encounter: Secondary | ICD-10-CM

## 2014-06-20 DIAGNOSIS — S29019A Strain of muscle and tendon of unspecified wall of thorax, initial encounter: Secondary | ICD-10-CM

## 2014-06-20 DIAGNOSIS — G459 Transient cerebral ischemic attack, unspecified: Secondary | ICD-10-CM

## 2014-06-20 DIAGNOSIS — G518 Other disorders of facial nerve: Secondary | ICD-10-CM

## 2014-06-20 DIAGNOSIS — IMO0002 Reserved for concepts with insufficient information to code with codable children: Secondary | ICD-10-CM | POA: Insufficient documentation

## 2014-06-20 DIAGNOSIS — R05 Cough: Secondary | ICD-10-CM

## 2014-06-20 DIAGNOSIS — G5139 Clonic hemifacial spasm, unspecified: Secondary | ICD-10-CM

## 2014-06-20 NOTE — Assessment & Plan Note (Signed)
Continue gabapentin.

## 2014-06-20 NOTE — Assessment & Plan Note (Signed)
H/o dementia with psychotic features. Did not tolerate aricept. Continue to monitor for now. fmhx alz dem and PD but pt sxs not quite consistent with this

## 2014-06-20 NOTE — Assessment & Plan Note (Signed)
Suffered at recent PT session - encouraged using pillow for coughing for more support as well scheduling tylenol 500mg  three times daily with meals. Update if sxs deteriorate or persiste.

## 2014-06-20 NOTE — Patient Instructions (Addendum)
I think you have right rib strain. Hold a pillow on that side. Start taking tylenol 500mg  three times daily with meals for next 5 days. Lungs are sounding clear today. Continue gabapentin. Good to see you today, call us with questions. Return to see me in 10 days at previously scheduled appointment.

## 2014-06-20 NOTE — Progress Notes (Signed)
Pre visit review using our clinic review tool, if applicable. No additional management support is needed unless otherwise documented below in the visit note. 

## 2014-06-20 NOTE — Progress Notes (Signed)
BP 132/64  Pulse 80  Temp(Src) 98.6 F (37 C) (Oral)  Wt 182 lb (82.555 kg)  SpO2 97%   CC: cough and facial twitching  Subjective:    Patient ID: Paula LarsenSusan R [name], female    DOB: November 24, 1926, 80 y.o.   MRN: [id number]  HPI: Paula LarsenSusan R [name] is a 80 y.o. female presenting on 06/20/2014 for Cough and Twitching   Has been going to PT - thinks pulled R rib but unsure how this happened. Denies inciting trauma or injury. Very painful esp with cough and movements. Has been using aspirin 325mg  prn and baclofen.  Chronic cough has returned for last 3 days - worse at night time. Nonproductive, no fevers, no chest pain or dyspnea or wheeze. No sick contacts. Prior thought allergy related and improved with allegra. Reports compliance with allegra.  Saw eye doctor 06/17/2014 - told legally blind due to macular degeneration. No diabetic retinopathy. Husband dials phone #s for her.  Lab Results  Component Value Date   HGBA1C 7.4* 02/02/2014   On sertraline 50mg  daily. Denies depressed mood.  Social worker going to home, discussing PACE.  MMSE 02/2014 by Dr. Alwyn RenHopper 15/30.  Sister with alzheimer's disease and parkinson's disease.  Denies personal history of tremors.  Working with PT for balance training. Walks with walker regularly.   Relevant past medical, surgical, family and social history reviewed and updated as indicated.  Allergies and medications reviewed and updated. Current Outpatient Prescriptions on File Prior to Visit  Medication Sig  . acetaminophen (TYLENOL) 500 MG tablet Take 500 mg by mouth every 6 (six) hours as needed (pain).  Marland Kitchen. albuterol (PROVENTIL HFA;VENTOLIN HFA) 108 (90 BASE) MCG/ACT inhaler Inhale 2 puffs into the lungs every 6 (six) hours as needed for wheezing or shortness of breath.  . ALPRAZolam (XANAX) 0.5 MG tablet Take 1 tablet (0.5 mg total) by mouth 3 (three) times daily.  Marland Kitchen. aspirin 325 MG tablet Take 325 mg by mouth daily.  . baclofen (LIORESAL) 10 MG tablet Take 10  mg by mouth 3 (three) times daily as needed for muscle spasms.   . benzonatate (TESSALON) 100 MG capsule Take 2 capsules (200 mg total) by mouth at bedtime as needed for cough.  . calcium carbonate (OS-CAL) 600 MG TABS Take 600 mg by mouth every other day.   . carvedilol (COREG) 6.25 MG tablet Take 6.25 mg by mouth 2 (two) times daily with a meal.  . Cholecalciferol (VITAMIN D3) 5000 UNITS TABS Take 5,000 Units by mouth every other day.   . cyanocobalamin 500 MCG tablet Take 500 mcg by mouth daily.  . fexofenadine (ALLEGRA) 180 MG tablet Take 1 tablet (180 mg total) by mouth daily.  . fluticasone (FLONASE) 50 MCG/ACT nasal spray Place 1 spray into both nostrils daily.  . furosemide (LASIX) 20 MG tablet Take 20 mg by mouth every morning.   . gabapentin (NEURONTIN) 100 MG capsule Take 100-200 mg by mouth 3 (three) times daily. Take 1 capsule (100mg ) in the morning, 1 capsule (100mg ) in the afternoon, and 2 capsules (200mg ) at night.  . haloperidol (HALDOL) 0.5 MG tablet Take 1 tablet (0.5 mg total) by mouth 3 (three) times daily.  Marland Kitchen. levothyroxine (SYNTHROID, LEVOTHROID) 175 MCG tablet Take 87.5-175 mcg by mouth daily before breakfast. Take 1 tab (175mcg) on Sunday, Monday, Wednesday, Friday, and Saturday. Take 0.5 tab (87.25mcg) on Tuesday and Thursday.  . losartan-hydrochlorothiazide (HYZAAR) 50-12.5 MG per tablet Take 1 tablet by mouth daily.  . metFORMIN (  GLUCOPHAGE-XR) 500 MG 24 hr tablet Take 250 mg by mouth at bedtime.  . ondansetron (ZOFRAN) 4 MG tablet Take 4 mg by mouth every 8 (eight) hours as needed for nausea or vomiting.  . pantoprazole (PROTONIX) 40 MG tablet Take 40 mg by mouth daily.  Bertram Gala Glycol-Propyl Glycol (SYSTANE) 0.4-0.3 % SOLN Apply 1 drop to eye 2 (two) times daily as needed (dry eyes).  . potassium chloride SA (K-DUR,KLOR-CON) 20 MEQ tablet Take 1 tablet by mouth  daily  . sertraline (ZOLOFT) 50 MG tablet Take 50 mg by mouth daily.   No current facility-administered  medications on file prior to visit.    Review of Systems Per HPI unless specifically indicated above    Objective:    BP 132/64  Pulse 80  Temp(Src) 98.6 F (37 C) (Oral)  Wt 182 lb (82.555 kg)  SpO2 97%  Physical Exam  Nursing note and vitals reviewed. Constitutional: She appears well-developed and well-nourished. No distress.  HENT:  Head: Normocephalic and atraumatic.  Mouth/Throat: Oropharynx is clear and moist. No oropharyngeal exudate.  Eyes: Conjunctivae and EOM are normal. Pupils are equal, round, and reactive to light. No scleral icterus.  Neck: Normal range of motion. Neck supple.  Cardiovascular: Normal rate, regular rhythm, normal heart sounds and intact distal pulses.   No murmur heard. Pulmonary/Chest: Effort normal and breath sounds normal. No respiratory distress. She has no wheezes. She has no rales.  Lungs clear, no cough noted  Musculoskeletal: She exhibits no edema.  Neurological:  Shuffling gait, needs significant assistance with ambulation or with walker, full support to get on exam table  Skin: Skin is warm and dry. No rash noted.  Psychiatric: She has a normal mood and affect.       Assessment & Plan:   Problem List Items Addressed This Visit   Sprain and strain of ribs     Suffered at recent PT session - encouraged using pillow for coughing for more support as well scheduling tylenol 500mg  three times daily with meals. Update if sxs deteriorate or persiste.    Facial spasm     Continue gabapentin.    Dementia arising in the senium and presenium     H/o dementia with psychotic features. Did not tolerate aricept. Continue to monitor for now. fmhx alz dem and PD but pt sxs not quite consistent with this    Cough - Primary     Cough has returned - but unrevealing eval today. Will encourage allegra use, monitor sxs for now. No red flags today        Follow up plan: Return as needed, for follow up visit in 10 days.

## 2014-06-20 NOTE — Assessment & Plan Note (Signed)
Cough has returned - but unrevealing eval today. Will encourage allegra use, monitor sxs for now. No red flags today

## 2014-06-21 ENCOUNTER — Encounter: Payer: Self-pay | Admitting: Pulmonary Disease

## 2014-06-21 ENCOUNTER — Ambulatory Visit (INDEPENDENT_AMBULATORY_CARE_PROVIDER_SITE_OTHER): Payer: Medicare Other | Admitting: Pulmonary Disease

## 2014-06-21 VITALS — BP 122/80 | HR 73 | Temp 98.0°F | Ht 65.0 in | Wt 181.4 lb

## 2014-06-21 DIAGNOSIS — J438 Other emphysema: Secondary | ICD-10-CM

## 2014-06-21 DIAGNOSIS — J439 Emphysema, unspecified: Secondary | ICD-10-CM

## 2014-06-21 NOTE — Assessment & Plan Note (Signed)
OK to take tessalon for  Cough Take Robitussin -DM 2 tsp thrice daily as needed for cough Albuterol 2 puffs as needed only for wheezing 

## 2014-06-21 NOTE — Patient Instructions (Signed)
OK to take tessalon for  Cough Take Robitussin -DM 2 tsp thrice daily as needed for cough Albuterol 2 puffs as needed only for wheezing

## 2014-06-21 NOTE — Progress Notes (Signed)
   Subjective:    Patient ID: Paula LarsenSusan R [name], female    DOB: 1927-10-28, 80 y.o.   MRN: [id number]  HPI  80 year old ex smoker ( quit 2012) for FU of COPD  She smoked for >50 yrs , quit 2012 .  She had Recurrent PNA - 2 episodes during 2013   Significant tests/ events  12/2011 >> surgery for abdominal aortic aneurysm by Dr. Hart RochesterLawson  CT chest 10/2012 showed Loculated left pleural effusion with suspected rounded atelectasis in the left lower lobe. Evidence of prior granulomatous disease with calcified left lower lobe granuloma, calcified left hilar lymph nodes and calcified granulomas in the liver and spleen.  CT abdomen clarified a cystic mass in the abdomen, likely a pancreatic pseudocyst.  Review of her imaging study shows left lower lobe airspace disease dating back to 10/13.  Of note, she is allergic to contrast media and had a reaction with neck swelling requiring steroids.   05/19/13 -- CT chest - LLL nodule is stable, Pneumonia like markings in the right upper and right lower lobes  ENT evaluation by Dr. Pollyann Kennedyosen for hoarseness was nondiagnostic   5 ER visits in 2014 for what seems to be hemifacial spasms - neurology (Dohmeier)   PFT showed mild airway obstruction- fev1 70%, ratio 73 -no BD response, TLC 70% c/w midl restriction      Adm 8/31- 07/13/13 for CHF  06/21/14  Chief Complaint  Patient presents with  . Follow-up    Pt reports her breathing has been fine. c/o frequently wheezing, dry cough-worse at night, getting worse. tessalon pearls does not help.    Chronic cough has returned for last 3 days -  legally blind due to macular degeneration, hemifacal spasms continue CXR 05/06/14 - mild edema Was seen by Sherene SiresWert for desatn - felt she would not benefit from O2 She ambulates with a walker.   Review of Systems neg for any significant sore throat, dysphagia, itching, sneezing, nasal congestion or excess/ purulent secretions, fever, chills, sweats, unintended wt loss, pleuritic  or exertional cp, hempoptysis, orthopnea pnd or change in chronic leg swelling. Also denies presyncope, palpitations, heartburn, abdominal pain, nausea, vomiting, diarrhea or change in bowel or urinary habits, dysuria,hematuria, rash, arthralgias, visual complaints, headache, numbness weakness or ataxia.     Objective:   Physical Exam  Gen. Pleasant, obese, in no distress ENT - no lesions, no post nasal drip Neck: No JVD, no thyromegaly, no carotid bruits Lungs: no use of accessory muscles, no dullness to percussion, decreased without rales or rhonchi  Cardiovascular: Rhythm regular, heart sounds  normal, no murmurs or gallops, no peripheral edema Musculoskeletal: No deformities, no cyanosis or clubbing , no tremors        Assessment & Plan:

## 2014-06-25 ENCOUNTER — Encounter: Payer: Self-pay | Admitting: Family Medicine

## 2014-06-27 ENCOUNTER — Telehealth: Payer: Self-pay

## 2014-06-27 NOTE — Telephone Encounter (Signed)
Mr Ellenberger left v/m; pt does not think she is supposed to take ASA 325 mg; Mr Zettie Phoxsom wants confirmation if pt is to take ASA 325 mg; Mr Coad request cb at [phone].

## 2014-06-27 NOTE — Telephone Encounter (Signed)
Message left notifying patient's husband.

## 2014-06-27 NOTE — Telephone Encounter (Signed)
continue aspirin 325mg  daily. This was started by neurology Dr. Vickey Hugerohmeier 03/2014

## 2014-06-29 ENCOUNTER — Telehealth: Payer: Self-pay | Admitting: *Deleted

## 2014-06-29 NOTE — Telephone Encounter (Signed)
Form for diabetic shoes in your IN box for completion.  

## 2014-06-30 ENCOUNTER — Ambulatory Visit (INDEPENDENT_AMBULATORY_CARE_PROVIDER_SITE_OTHER): Payer: Medicare Other | Admitting: Family Medicine

## 2014-06-30 ENCOUNTER — Encounter: Payer: Self-pay | Admitting: Family Medicine

## 2014-06-30 VITALS — BP 124/62 | HR 76 | Temp 98.8°F | Wt 184.0 lb

## 2014-06-30 DIAGNOSIS — E1151 Type 2 diabetes mellitus with diabetic peripheral angiopathy without gangrene: Secondary | ICD-10-CM

## 2014-06-30 DIAGNOSIS — J438 Other emphysema: Secondary | ICD-10-CM

## 2014-06-30 DIAGNOSIS — Z23 Encounter for immunization: Secondary | ICD-10-CM

## 2014-06-30 DIAGNOSIS — E1159 Type 2 diabetes mellitus with other circulatory complications: Secondary | ICD-10-CM

## 2014-06-30 DIAGNOSIS — F0390 Unspecified dementia without behavioral disturbance: Secondary | ICD-10-CM

## 2014-06-30 DIAGNOSIS — F068 Other specified mental disorders due to known physiological condition: Secondary | ICD-10-CM

## 2014-06-30 DIAGNOSIS — I798 Other disorders of arteries, arterioles and capillaries in diseases classified elsewhere: Secondary | ICD-10-CM

## 2014-06-30 MED ORDER — BENZONATATE 100 MG PO CAPS: 200.0000 mg | ORAL_CAPSULE | Freq: Every evening | ORAL | Status: DC | PRN

## 2014-06-30 NOTE — Addendum Note (Signed)
Addended by: Josph MachoANCE, Mariama Saintvil A on: 06/30/2014 11:40 AM   Modules accepted: Orders

## 2014-06-30 NOTE — Telephone Encounter (Signed)
[facility] left v/m requesting status of request for diabetic shoes.I spoke with Sierra LeoneLatisha at [facility] at [phone] and advised form is on doctor's desk for completion.

## 2014-06-30 NOTE — Assessment & Plan Note (Signed)
Did not tolerate aricept. Continue to monitor.

## 2014-06-30 NOTE — Assessment & Plan Note (Signed)
Chronic, stable. Continue regimen. Reviewed use of vocal glucose meter and did cbg today using her machine - 155. A1c controlled for her age and comorbidities - goal <8%

## 2014-06-30 NOTE — Assessment & Plan Note (Signed)
Stable on prn albuterol.  

## 2014-06-30 NOTE — Progress Notes (Signed)
BP 124/62  Pulse 76  Temp(Src) 98.8 F (37.1 C) (Oral)  Wt 184 lb (83.462 kg)   CC: f/u visit  Subjective:    Patient ID: Paula [name], female    DOB: 19-Jan-1927, 80 y.o.   MRN: [id number]  HPI: Paula [name] is a 80 y.o. female presenting on 06/30/2014 for Follow-up   Recent return of chronic cough - saw Dr. Vassie Loll this week for COPD, recommended tessalon perls and cheratussin for cough bid prn. rec albuterol prn wheezing only.  Persistent rib pain on right. Thinks this was strained at therapy. Slowly getting.   Discussed back pain - pt does not want back brace.  DM - compliant with metformin xr 250mg  daily. Eye exam 06/2014. No paresthesias. Pneumovax 10/2012. Prevnar - today. Flu shot today. Lab Results  Component Value Date   HGBA1C 7.4* 02/02/2014    Relevant past medical, surgical, family and social history reviewed and updated as indicated.  Allergies and medications reviewed and updated. Current Outpatient Prescriptions on File Prior to Visit  Medication Sig  . acetaminophen (TYLENOL) 500 MG tablet Take 500 mg by mouth every 6 (six) hours as needed (pain).  Marland Kitchen albuterol (PROVENTIL HFA;VENTOLIN HFA) 108 (90 BASE) MCG/ACT inhaler Inhale 2 puffs into the lungs every 6 (six) hours as needed for wheezing or shortness of breath.  . ALPRAZolam (XANAX) 0.5 MG tablet Take 1 tablet (0.5 mg total) by mouth 3 (three) times daily.  Marland Kitchen aspirin 325 MG tablet Take 325 mg by mouth daily.  . baclofen (LIORESAL) 10 MG tablet Take 10 mg by mouth 3 (three) times daily as needed for muscle spasms.   . benzonatate (TESSALON) 100 MG capsule Take 2 capsules (200 mg total) by mouth at bedtime as needed for cough.  . calcium carbonate (OS-CAL) 600 MG TABS Take 600 mg by mouth every other day.   . carvedilol (COREG) 6.25 MG tablet Take 6.25 mg by mouth 2 (two) times daily with a meal.  . Cholecalciferol (VITAMIN D3) 5000 UNITS TABS Take 5,000 Units by mouth every other day.   . cyanocobalamin 500 MCG  tablet Take 500 mcg by mouth daily.  . fexofenadine (ALLEGRA) 180 MG tablet Take 1 tablet (180 mg total) by mouth daily.  . fluticasone (FLONASE) 50 MCG/ACT nasal spray Place 1 spray into both nostrils daily.  . furosemide (LASIX) 20 MG tablet Take 20 mg by mouth every morning.   . gabapentin (NEURONTIN) 100 MG capsule Take 100-200 mg by mouth 3 (three) times daily. Take 1 capsule (100mg ) in the morning, 1 capsule (100mg ) in the afternoon, and 2 capsules (200mg ) at night.  . haloperidol (HALDOL) 0.5 MG tablet Take 1 tablet (0.5 mg total) by mouth 3 (three) times daily.  Marland Kitchen levothyroxine (SYNTHROID, LEVOTHROID) 175 MCG tablet Take 87.5-175 mcg by mouth daily before breakfast. Take 1 tab ( ) on Sunday, Monday, Wednesday, Friday, and Saturday. Take 0.5 tab (87.23mcg) on Tuesday and Thursday.  . losartan-hydrochlorothiazide (HYZAAR) 50-12.5 MG per tablet Take 1 tablet by mouth daily.  . metFORMIN (GLUCOPHAGE-XR) 500 MG 24 hr tablet Take 250 mg by mouth at bedtime.  . ondansetron (ZOFRAN) 4 MG tablet Take 4 mg by mouth every 8 (eight) hours as needed for nausea or vomiting.  . pantoprazole (PROTONIX) 40 MG tablet Take 40 mg by mouth daily.  Bertram Gala Glycol-Propyl Glycol (SYSTANE) 0.4-0.3 % SOLN Apply 1 drop to eye 2 (two) times daily as needed (dry eyes).  . potassium chloride SA (K-DUR,KLOR-CON) 20  MEQ tablet Take 1 tablet by mouth  daily  . sertraline (ZOLOFT) 50 MG tablet Take 50 mg by mouth daily.   No current facility-administered medications on file prior to visit.    Review of Systems Per HPI unless specifically indicated above    Objective:    BP 124/62  Pulse 76  Temp(Src) 98.8 F (37.1 C) (Oral)  Wt 184 lb (83.462 kg)  Physical Exam  Nursing note and vitals reviewed. Constitutional: She appears well-developed and well-nourished. No distress.  Skin: Skin is warm and dry. No rash noted.  Psychiatric: She has a normal mood and affect.   Results for orders placed in visit on  06/20/14  HM DIABETES EYE EXAM      Result Value Ref Range   HM Diabetic Eye Exam No Retinopathy  No Retinopathy      Assessment & Plan:  Declines back brace today   Problem List Items Addressed This Visit   COPD (chronic obstructive pulmonary disease) with emphysema - Primary     Stable on prn albuterol.    Dementia arising in the senium and presenium     Did not tolerate aricept. Continue to monitor.    Type 2 diabetes, controlled, with peripheral circulatory disorder     Chronic, stable. Continue regimen. Reviewed use of vocal glucose meter and did cbg today using her machine - 155. A1c controlled for her age and comorbidities - goal <8%        Follow up plan: Return in about 1 month (around 07/31/2014), or as needed, for follow up visit.

## 2014-06-30 NOTE — Patient Instructions (Signed)
Flu and prevnar today. Good to see you today, call us with quesitons. Return in 1 month for follow up.

## 2014-06-30 NOTE — Progress Notes (Signed)
Pre visit review using our clinic review tool, if applicable. No additional management support is needed unless otherwise documented below in the visit note. 

## 2014-07-01 ENCOUNTER — Telehealth: Payer: Self-pay | Admitting: *Deleted

## 2014-07-01 NOTE — Telephone Encounter (Signed)
Form for back brace in your IN box for completion.

## 2014-07-01 NOTE — Telephone Encounter (Signed)
Form faxed back. (This is the first one returned to me in my IN box) Yes. I do send them back with that form, but with her coming in yesterday, I wasn't sure if this had been discussed.

## 2014-07-01 NOTE — Telephone Encounter (Signed)
Placed in Kim's box. 2nd response to their request. Pt does not want this. Regardless we should be sending back routine form saying pt needs office visit for these DME requests - are we not doing this?

## 2014-07-01 NOTE — Telephone Encounter (Signed)
placed in my out box

## 2014-07-04 ENCOUNTER — Ambulatory Visit (INDEPENDENT_AMBULATORY_CARE_PROVIDER_SITE_OTHER): Payer: Medicare Other | Admitting: Family Medicine

## 2014-07-04 ENCOUNTER — Encounter: Payer: Self-pay | Admitting: Family Medicine

## 2014-07-04 VITALS — BP 118/72 | HR 80 | Temp 98.2°F | Wt 182.2 lb

## 2014-07-04 DIAGNOSIS — R5381 Other malaise: Secondary | ICD-10-CM

## 2014-07-04 DIAGNOSIS — G5139 Clonic hemifacial spasm, unspecified: Secondary | ICD-10-CM

## 2014-07-04 DIAGNOSIS — R5383 Other fatigue: Principal | ICD-10-CM

## 2014-07-04 DIAGNOSIS — R221 Localized swelling, mass and lump, neck: Secondary | ICD-10-CM

## 2014-07-04 DIAGNOSIS — R220 Localized swelling, mass and lump, head: Secondary | ICD-10-CM

## 2014-07-04 DIAGNOSIS — G518 Other disorders of facial nerve: Secondary | ICD-10-CM

## 2014-07-04 DIAGNOSIS — K118 Other diseases of salivary glands: Secondary | ICD-10-CM

## 2014-07-04 MED ORDER — GABAPENTIN 100 MG PO CAPS: 200.0000 mg | ORAL_CAPSULE | Freq: Three times a day (TID) | ORAL | Status: DC

## 2014-07-04 MED ORDER — BACLOFEN 10 MG PO TABS: 10.0000 mg | ORAL_TABLET | Freq: Four times a day (QID) | ORAL | Status: DC | PRN

## 2014-07-04 NOTE — Assessment & Plan Note (Signed)
Pt states ENT advised against surgery.

## 2014-07-04 NOTE — Progress Notes (Signed)
Pre visit review using our clinic review tool, if applicable. No additional management support is needed unless otherwise documented below in the visit note. 

## 2014-07-04 NOTE — Patient Instructions (Addendum)
Ok to do trial off protonix and monitor for return of reflux symptoms. Ok to increase baclofen to  four times a day as needed. We could increase gabapentin to 2 capsules each time (total of  three times a day). Watch for sedation on the higher dose Try jaw exercises. Good to see you today.

## 2014-07-04 NOTE — Assessment & Plan Note (Signed)
With unrevealing exam. See above for plan.

## 2014-07-04 NOTE — Progress Notes (Signed)
BP 118/72  Pulse 80  Temp(Src) 98.2 F (36.8 C) (Oral)  Wt 182 lb 4 oz (82.668 kg)   CC: malaise with dizziness  Subjective:    Patient ID: Paula [name], female    DOB: 06/18/1927, 80 y.o.   MRN: [id number]  HPI: Paula [name] is a 80 y.o. female presenting on 07/04/2014 for Dizziness   THN came out to house last week.  Not feeling well today - endorses some throat swelling and facial tightness, and endorses worsening facial spasms. Yesterday had diarrhea as well. No new medications recently. No fevers/chills, nausea, vomiting, abd pain, headache.   Relevant past medical, surgical, family and social history reviewed and updated as indicated.  Allergies and medications reviewed and updated. Current Outpatient Prescriptions on File Prior to Visit  Medication Sig  . acetaminophen (TYLENOL) 500 MG tablet Take 500 mg by mouth every 6 (six) hours as needed (pain).  Marland Kitchen albuterol (PROVENTIL HFA;VENTOLIN HFA) 108 (90 BASE) MCG/ACT inhaler Inhale 2 puffs into the lungs every 6 (six) hours as needed for wheezing or shortness of breath.  . ALPRAZolam (XANAX) 0.5 MG tablet Take 1 tablet (0.5 mg total) by mouth 3 (three) times daily.  Marland Kitchen aspirin 325 MG tablet Take 325 mg by mouth daily.  . benzonatate (TESSALON) 100 MG capsule Take 2 capsules (200 mg total) by mouth at bedtime as needed for cough.  . calcium carbonate (OS-CAL) 600 MG TABS Take 600 mg by mouth every other day.   . carvedilol (COREG) 6.25 MG tablet Take 6.25 mg by mouth 2 (two) times daily with a meal.  . Cholecalciferol (VITAMIN D3) 5000 UNITS TABS Take 5,000 Units by mouth every other day.   . cyanocobalamin 500 MCG tablet Take 500 mcg by mouth daily.  . fexofenadine (ALLEGRA) 180 MG tablet Take 1 tablet (180 mg total) by mouth daily.  . fluticasone (FLONASE) 50 MCG/ACT nasal spray Place 1 spray into both nostrils daily.  . furosemide (LASIX) 20 MG tablet Take 20 mg by mouth every morning.   . haloperidol (HALDOL) 0.5 MG  tablet Take 1 tablet (0.5 mg total) by mouth 3 (three) times daily.  Marland Kitchen levothyroxine (SYNTHROID, LEVOTHROID) 175 MCG tablet Take 87.5-175 mcg by mouth daily before breakfast. Take 1 tab ( ) on Sunday, Monday, Wednesday, Friday, and Saturday. Take 0.5 tab (87.74mcg) on Tuesday and Thursday.  . losartan-hydrochlorothiazide (HYZAAR) 50-12.5 MG per tablet Take 1 tablet by mouth daily.  . metFORMIN (GLUCOPHAGE-XR) 500 MG 24 hr tablet Take 250 mg by mouth at bedtime.  . ondansetron (ZOFRAN) 4 MG tablet Take 4 mg by mouth every 8 (eight) hours as needed for nausea or vomiting.  . pantoprazole (PROTONIX) 40 MG tablet Take 40 mg by mouth every other day.   Bertram Gala Glycol-Propyl Glycol (SYSTANE) 0.4-0.3 % SOLN Apply 1 drop to eye 2 (two) times daily as needed (dry eyes).  . potassium chloride SA (K-DUR,KLOR-CON) 20 MEQ tablet Take 1 tablet by mouth  daily  . sertraline (ZOLOFT) 50 MG tablet Take 50 mg by mouth daily.   No current facility-administered medications on file prior to visit.    Review of Systems Per HPI unless specifically indicated above    Objective:    BP 118/72  Pulse 80  Temp(Src) 98.2 F (36.8 C) (Oral)  Wt 182 lb 4 oz (82.668 kg)  Physical Exam  Nursing note and vitals reviewed. Constitutional: She appears well-developed and well-nourished. No distress.  HENT:  Mouth/Throat: Oropharynx is clear and  moist. No oropharyngeal exudate.  Popping of bilateral TMJs  Eyes: Conjunctivae and EOM are normal. Pupils are equal, round, and reactive to light. No scleral icterus.  Neck: Normal range of motion. Neck supple. No thyromegaly present.  Small nodule R ankle of jaw - known mass there  Cardiovascular: Normal rate, regular rhythm, normal heart sounds and intact distal pulses.   No murmur heard. Pulmonary/Chest: Effort normal and breath sounds normal. No respiratory distress. She has no wheezes. She has no rales.  Musculoskeletal: She exhibits no edema.  Lymphadenopathy:     She has no cervical adenopathy.  Skin: Skin is warm and dry. No rash noted.  Psychiatric: She has a normal mood and affect.   Results for orders placed in visit on 06/20/14  HM DIABETES EYE EXAM      Result Value Ref Range   HM Diabetic Eye Exam No Retinopathy  No Retinopathy      Assessment & Plan:   Problem List Items Addressed This Visit   Facial spasm     Recently worsening. Will do trial of higher gabapentin dose (increase to  tid, with caution for oversedation or dizziness) and may use 4th baclofen during the day. Pt and husband agree, will update me with effect of med.    Other malaise and fatigue - Primary     With unrevealing exam. See above for plan.    Parotid mass     Pt states ENT advised against surgery.        Follow up plan: Return if symptoms worsen or fail to improve.

## 2014-07-04 NOTE — Assessment & Plan Note (Signed)
Recently worsening. Will do trial of higher gabapentin dose (increase to  tid, with caution for oversedation or dizziness) and may use 4th baclofen during the day. Pt and husband agree, will update me with effect of med.

## 2014-07-05 ENCOUNTER — Ambulatory Visit: Payer: Medicare Other | Admitting: Family Medicine

## 2014-07-08 ENCOUNTER — Encounter (HOSPITAL_COMMUNITY): Payer: Self-pay | Admitting: Emergency Medicine

## 2014-07-08 ENCOUNTER — Emergency Department (HOSPITAL_COMMUNITY)
Admission: EM | Admit: 2014-07-08 | Discharge: 2014-07-10 | Disposition: A | Discharge status: Other Acute Inpatient Hospital | Payer: Medicare Other | Attending: Emergency Medicine | Admitting: Emergency Medicine

## 2014-07-08 DIAGNOSIS — K219 Gastro-esophageal reflux disease without esophagitis: Secondary | ICD-10-CM | POA: Insufficient documentation

## 2014-07-08 DIAGNOSIS — I10 Essential (primary) hypertension: Secondary | ICD-10-CM | POA: Insufficient documentation

## 2014-07-08 DIAGNOSIS — E119 Type 2 diabetes mellitus without complications: Secondary | ICD-10-CM | POA: Insufficient documentation

## 2014-07-08 DIAGNOSIS — F411 Generalized anxiety disorder: Secondary | ICD-10-CM | POA: Diagnosis not present

## 2014-07-08 DIAGNOSIS — Z9889 Other specified postprocedural states: Secondary | ICD-10-CM | POA: Insufficient documentation

## 2014-07-08 DIAGNOSIS — Z880 Allergy status to penicillin: Secondary | ICD-10-CM | POA: Diagnosis not present

## 2014-07-08 DIAGNOSIS — R4182 Altered mental status, unspecified: Secondary | ICD-10-CM | POA: Diagnosis present

## 2014-07-08 DIAGNOSIS — H548 Legal blindness, as defined in USA: Secondary | ICD-10-CM | POA: Insufficient documentation

## 2014-07-08 DIAGNOSIS — E039 Hypothyroidism, unspecified: Secondary | ICD-10-CM | POA: Diagnosis not present

## 2014-07-08 DIAGNOSIS — Z79899 Other long term (current) drug therapy: Secondary | ICD-10-CM | POA: Insufficient documentation

## 2014-07-08 DIAGNOSIS — Z7982 Long term (current) use of aspirin: Secondary | ICD-10-CM | POA: Diagnosis not present

## 2014-07-08 DIAGNOSIS — Z87891 Personal history of nicotine dependence: Secondary | ICD-10-CM | POA: Insufficient documentation

## 2014-07-08 DIAGNOSIS — I509 Heart failure, unspecified: Secondary | ICD-10-CM | POA: Insufficient documentation

## 2014-07-08 DIAGNOSIS — IMO0002 Reserved for concepts with insufficient information to code with codable children: Secondary | ICD-10-CM | POA: Insufficient documentation

## 2014-07-08 DIAGNOSIS — Z950 Presence of cardiac pacemaker: Secondary | ICD-10-CM | POA: Insufficient documentation

## 2014-07-08 DIAGNOSIS — F0390 Unspecified dementia without behavioral disturbance: Secondary | ICD-10-CM | POA: Insufficient documentation

## 2014-07-08 DIAGNOSIS — F3289 Other specified depressive episodes: Secondary | ICD-10-CM | POA: Diagnosis not present

## 2014-07-08 DIAGNOSIS — J449 Chronic obstructive pulmonary disease, unspecified: Secondary | ICD-10-CM | POA: Diagnosis not present

## 2014-07-08 DIAGNOSIS — F329 Major depressive disorder, single episode, unspecified: Secondary | ICD-10-CM | POA: Insufficient documentation

## 2014-07-08 DIAGNOSIS — J4489 Other specified chronic obstructive pulmonary disease: Secondary | ICD-10-CM | POA: Insufficient documentation

## 2014-07-08 NOTE — ED Notes (Signed)
Patient presents to ED via [facility]. Per EMS the patients husband called EMS due to waking the patient up from her nap tonight and the patient was "extremely confused" and attempted to run away due to the fact that she does not recognize her husband. Patient has "progressive dementia" and the husband states," tonight is the worse I have ever seen it." EMS states that patient was not cooperative and administered 2 mg versed IM. Patient is A&Ox4 upon arrival to ED. No acute distress noted at this time. VSS.

## 2014-07-09 ENCOUNTER — Emergency Department (HOSPITAL_COMMUNITY): Payer: Medicare Other

## 2014-07-09 DIAGNOSIS — F0390 Unspecified dementia without behavioral disturbance: Secondary | ICD-10-CM | POA: Diagnosis not present

## 2014-07-09 LAB — URINALYSIS, ROUTINE W REFLEX MICROSCOPIC
BILIRUBIN URINE: NEGATIVE
Glucose, UA: NEGATIVE mg/dL
Hgb urine dipstick: NEGATIVE
KETONES UR: NEGATIVE mg/dL
NITRITE: NEGATIVE
PH: 5.5 (ref 5.0–8.0)
Protein, ur: NEGATIVE mg/dL
SPECIFIC GRAVITY, URINE: 1.024 (ref 1.005–1.030)
Urobilinogen, UA: 1 mg/dL (ref 0.0–1.0)

## 2014-07-09 LAB — CBC WITH DIFFERENTIAL/PLATELET
BASOS ABS: 0.1 10*3/uL (ref 0.0–0.1)
Basophils Relative: 1 % (ref 0–1)
EOS PCT: 5 % (ref 0–5)
Eosinophils Absolute: 0.6 10*3/uL (ref 0.0–0.7)
HEMATOCRIT: 34.7 % — AB (ref 36.0–46.0)
HEMOGLOBIN: 11 g/dL — AB (ref 12.0–15.0)
LYMPHS ABS: 2.9 10*3/uL (ref 0.7–4.0)
Lymphocytes Relative: 23 % (ref 12–46)
MCH: 33.4 pg (ref 26.0–34.0)
MCHC: 31.7 g/dL (ref 30.0–36.0)
MCV: 105.5 fL — AB (ref 78.0–100.0)
MONO ABS: 1 10*3/uL (ref 0.1–1.0)
Monocytes Relative: 8 % (ref 3–12)
NEUTROS ABS: 7.9 10*3/uL — AB (ref 1.7–7.7)
Neutrophils Relative %: 63 % (ref 43–77)
Platelets: 257 10*3/uL (ref 150–400)
RBC: 3.29 MIL/uL — AB (ref 3.87–5.11)
RDW: 16 % — ABNORMAL HIGH (ref 11.5–15.5)
WBC: 12.3 10*3/uL — AB (ref 4.0–10.5)

## 2014-07-09 LAB — BASIC METABOLIC PANEL
ANION GAP: 14 (ref 5–15)
BUN: 16 mg/dL (ref 6–23)
CO2: 28 mEq/L (ref 19–32)
Calcium: 9.9 mg/dL (ref 8.4–10.5)
Chloride: 102 mEq/L (ref 96–112)
Creatinine, Ser: 0.91 mg/dL (ref 0.50–1.10)
GFR calc non Af Amer: 56 mL/min — ABNORMAL LOW (ref >90)
GFR, EST AFRICAN AMERICAN: 65 mL/min — AB (ref >90)
Glucose, Bld: 159 mg/dL — ABNORMAL HIGH (ref 70–99)
POTASSIUM: 3.9 meq/L (ref 3.7–5.3)
Sodium: 144 mEq/L (ref 137–147)

## 2014-07-09 LAB — URINE MICROSCOPIC-ADD ON

## 2014-07-09 LAB — CBG MONITORING, ED: GLUCOSE-CAPILLARY: 127 mg/dL — AB (ref 70–99)

## 2014-07-09 MED ORDER — ASPIRIN 325 MG PO TABS
325.0000 mg | ORAL_TABLET | Freq: Every day | ORAL | Status: DC
  Administered 2014-07-09 – 2014-07-10 (×2): 325 mg via ORAL
  Filled 2014-07-09 (×2): qty 1

## 2014-07-09 MED ORDER — FUROSEMIDE 20 MG PO TABS
20.0000 mg | ORAL_TABLET | Freq: Every morning | ORAL | Status: DC
Start: 2014-07-09 — End: 2014-07-10
  Administered 2014-07-09 – 2014-07-10 (×2): 20 mg via ORAL
  Filled 2014-07-09 (×2): qty 1

## 2014-07-09 MED ORDER — LOSARTAN POTASSIUM-HCTZ 50-12.5 MG PO TABS: 1.0000 | ORAL_TABLET | Freq: Every day | ORAL | Status: DC

## 2014-07-09 MED ORDER — METFORMIN HCL ER 500 MG PO TB24
250.0000 mg | ORAL_TABLET | Freq: Every day | ORAL | Status: DC
  Administered 2014-07-09: 250 mg via ORAL
  Filled 2014-07-09 (×2): qty 0.5

## 2014-07-09 MED ORDER — LEVOTHYROXINE SODIUM 175 MCG PO TABS
175.0000 ug | ORAL_TABLET | ORAL | Status: DC
  Administered 2014-07-09 – 2014-07-10 (×2): 175 ug via ORAL
  Filled 2014-07-09 (×3): qty 1

## 2014-07-09 MED ORDER — PANTOPRAZOLE SODIUM 40 MG PO TBEC
40.0000 mg | DELAYED_RELEASE_TABLET | ORAL | Status: DC
  Administered 2014-07-09: 40 mg via ORAL
  Filled 2014-07-09: qty 1

## 2014-07-09 MED ORDER — SERTRALINE HCL 50 MG PO TABS
50.0000 mg | ORAL_TABLET | Freq: Every day | ORAL | Status: DC
  Administered 2014-07-09 – 2014-07-10 (×2): 50 mg via ORAL
  Filled 2014-07-09 (×2): qty 1

## 2014-07-09 MED ORDER — HALOPERIDOL 1 MG PO TABS
1.0000 mg | ORAL_TABLET | Freq: Once | ORAL | Status: AC
  Administered 2014-07-09: 1 mg via ORAL
  Filled 2014-07-09: qty 1

## 2014-07-09 MED ORDER — LOSARTAN POTASSIUM 50 MG PO TABS
50.0000 mg | ORAL_TABLET | Freq: Every day | ORAL | Status: DC
  Administered 2014-07-09: 50 mg via ORAL
  Filled 2014-07-09 (×3): qty 1

## 2014-07-09 MED ORDER — LORAZEPAM 1 MG PO TABS
1.0000 mg | ORAL_TABLET | Freq: Three times a day (TID) | ORAL | Status: DC | PRN
  Administered 2014-07-09 – 2014-07-10 (×2): 1 mg via ORAL
  Filled 2014-07-09 (×2): qty 1

## 2014-07-09 MED ORDER — CARVEDILOL 6.25 MG PO TABS
6.2500 mg | ORAL_TABLET | Freq: Two times a day (BID) | ORAL | Status: DC
  Administered 2014-07-09 – 2014-07-10 (×3): 6.25 mg via ORAL
  Filled 2014-07-09 (×6): qty 1

## 2014-07-09 MED ORDER — LORAZEPAM 2 MG/ML IJ SOLN
2.0000 mg | Freq: Once | INTRAMUSCULAR | Status: AC
  Administered 2014-07-09: 2 mg via INTRAMUSCULAR
  Filled 2014-07-09: qty 1

## 2014-07-09 MED ORDER — HYDROCHLOROTHIAZIDE 12.5 MG PO CAPS
12.5000 mg | ORAL_CAPSULE | Freq: Every day | ORAL | Status: DC
  Administered 2014-07-09 – 2014-07-10 (×2): 12.5 mg via ORAL
  Filled 2014-07-09 (×2): qty 1

## 2014-07-09 MED ORDER — GABAPENTIN 100 MG PO CAPS
200.0000 mg | ORAL_CAPSULE | Freq: Three times a day (TID) | ORAL | Status: DC
  Administered 2014-07-09 (×3): 200 mg via ORAL
  Filled 2014-07-09 (×9): qty 2

## 2014-07-09 MED ORDER — CALCIUM CARBONATE 1250 (500 CA) MG PO TABS
1.0000 | ORAL_TABLET | ORAL | Status: DC
  Filled 2014-07-09: qty 1

## 2014-07-09 MED ORDER — LEVOTHYROXINE SODIUM 175 MCG PO TABS: 87.5000 ug | ORAL_TABLET | ORAL | Status: DC

## 2014-07-09 MED ORDER — HALOPERIDOL LACTATE 5 MG/ML IJ SOLN: 5.0000 mg | Freq: Once | INTRAMUSCULAR | Status: AC | PRN

## 2014-07-09 MED ORDER — BENZONATATE 100 MG PO CAPS
200.0000 mg | ORAL_CAPSULE | Freq: Every evening | ORAL | Status: DC | PRN
  Filled 2014-07-09: qty 2

## 2014-07-09 MED ORDER — HALOPERIDOL 1 MG PO TABS
0.5000 mg | ORAL_TABLET | Freq: Three times a day (TID) | ORAL | Status: DC
  Administered 2014-07-09 – 2014-07-10 (×2): 0.5 mg via ORAL
  Filled 2014-07-09 (×3): qty 1

## 2014-07-09 MED ORDER — BACLOFEN 10 MG PO TABS
10.0000 mg | ORAL_TABLET | Freq: Four times a day (QID) | ORAL | Status: DC | PRN
  Administered 2014-07-09: 10 mg via ORAL
  Filled 2014-07-09 (×2): qty 1

## 2014-07-09 MED ORDER — ALBUTEROL SULFATE HFA 108 (90 BASE) MCG/ACT IN AERS
2.0000 | INHALATION_SPRAY | RESPIRATORY_TRACT | Status: DC
  Administered 2014-07-09 – 2014-07-10 (×4): 2 via RESPIRATORY_TRACT
  Filled 2014-07-09: qty 6.7

## 2014-07-09 MED ORDER — POTASSIUM CHLORIDE CRYS ER 20 MEQ PO TBCR
20.0000 meq | EXTENDED_RELEASE_TABLET | Freq: Every day | ORAL | Status: DC
Start: 2014-07-09 — End: 2014-07-10
  Administered 2014-07-09 – 2014-07-10 (×2): 20 meq via ORAL
  Filled 2014-07-09 (×2): qty 1

## 2014-07-09 NOTE — ED Provider Notes (Signed)
I again spoke with staff at behavioral health. Recommendations for inpatient geriatric/psychiatric. They are researching possible bed availability at this time. No further recommendations for medication changes at this time.  Rolland Porter, MD 07/09/14 1029

## 2014-07-09 NOTE — ED Notes (Signed)
Family at bedside. 

## 2014-07-09 NOTE — ED Notes (Signed)
Patient is asleep at this time. Will complete shift psych assessment when patient is awake and able to answer questions. Airway intact. Respirations even and unlabored.

## 2014-07-09 NOTE — ED Notes (Signed)
TTS at bedside. 

## 2014-07-09 NOTE — ED Notes (Signed)
Patient ambulated to restroom with assistance of 2 staff members.  Patient urinated but refused to change her brief which was moderately saturated with urine despite insistence of several staff members

## 2014-07-09 NOTE — BH Assessment (Signed)
Received call from [facility]. They are considering Pt for transfer and request a chest x-ray. Notified Gwenyth Allegra, Charity fundraiser at [employer].  Harlin Rain Ria Comment, [facility] Triage Specialist [phone]

## 2014-07-09 NOTE — ED Provider Notes (Signed)
CSN: [id number]     Arrival date & time 07/08/14  2347 History   First MD Initiated Contact with Patient 07/09/14 0013     Chief Complaint  Patient presents with  . Altered Mental Status   Level V caveat due to altered mental status/dementia.  (Consider location/radiation/quality/duration/timing/severity/associated sxs/prior Treatment) Patient is a 80 y.o. female presenting with altered mental status. The history is provided by the patient and the spouse.  Altered Mental Status  patient is 80 year old with a history of progressive dementia. Today after taking a nap she was woken up by her husband and she did not recognize him. She became belligerent and aggressive because she thought there was a strange manner. She still does not identify. She states that she feels good otherwise. No headache. She denies confusion. In discussion with the husband she has had some episodes like this in the past. He did seen at home by social work. He may need a higher level of care for her.  Past Medical History  Diagnosis Date  . Depression   . Ejection fraction     EF 40%, echo, November, 2012  / in improved, EF 60%, echo, December, 2012  . Contrast media allergy     Patient feels poorly with contrast  . Status post AAA (abdominal aortic aneurysm) repair     Surgical repair, Dr. Hart Rochester, December 27, 2011  . Pre-syncope     vasovagal w/ heart block  . Parotid mass     has refused further eval 10/2011  . GERD (gastroesophageal reflux disease)   . Anxiety   . Facial tic     L sided spasms - Botox trial summer 2013, ?effective  . ARMD (age related macular degeneration) 06/2014    bilateral vision loss - legally blind  . AAA (abdominal aortic aneurysm)     s/p repair 12/2011  . Congestive heart failure     NL EF 10/2011 echo   . Diabetes mellitus type II, controlled   . Hypothyroidism   . Sinus bradycardia 09/19/2011    Occurring simultaneously with complete heart block   . Hypertension   . COPD  (chronic obstructive pulmonary disease)   . Facial spasm     L hemifacial-treated w/ botox xeomin -Dr Terrace Arabia  . Dementia     MRI with progressive chronic microvascular ischemia and generalized cerebral atrophy  . Lumbosacral spondylosis without myelopathy   . Legally blind 06/2014    due to ARMD   Past Surgical History  Procedure Laterality Date  . Cholecystectomy    . Knee cartilage surgery      left  . Sinus surgery with instatrak    . Appendectomy    . Oophorectomy      ovarian cyst  . Cataract extraction      bilateral  . Bladder repair    . US echocardiography  10/14/11  . Abdominal aortic aneurysm repair  12/27/2011    ANEURYSM ABDOMINAL AORTIC REPAIR;  Surgeon: Josephina Gip, MD; Resection and Grafting Abdominal Aortic Aneurysm , Aorta Bi Iliac.  Marland Kitchen Pacemaker insertion  11/12  . Cardiac catheterization  11/04/11   Family History  Problem Relation Age of Onset  . Esophageal cancer Father   . Cancer Father   . Breast cancer Sister   . Cancer Sister   . Colon cancer Sister   . Heart disease Sister   . Heart disease Mother   . Heart disease Son   . Parkinson's disease Sister  younger   History  Substance Use Topics  . Smoking status: Former Smoker -- 1.00 packs/day for 60 years    Types: Cigarettes    Quit date: 08/31/2011  . Smokeless tobacco: Never Used  . Alcohol Use: No   OB History   Grav Para Term Preterm Abortions TAB SAB Ect Mult Living                 Review of Systems  Unable to perform ROS     Allergies  Bactrim; Contrast media; Gadolinium derivatives; Iohexol; Penicillins; Sulfa antibiotics; Avelox; Aricept; Ciprofloxacin; Cymbalta; Delsym; and Hydromet  Home Medications   Prior to Admission medications   Medication Sig Start Date End Date Taking? Authorizing Provider  acetaminophen (TYLENOL) 500 MG tablet Take 500 mg by mouth every 6 (six) hours as needed (pain).   Yes Historical Provider, MD  albuterol (PROVENTIL HFA;VENTOLIN HFA) 108 (90  BASE) MCG/ACT inhaler Inhale 2 puffs into the lungs every 6 (six) hours as needed for wheezing or shortness of breath. 03/24/14  Yes Newt Lukes, MD  ALPRAZolam Prudy Feeler) 0.5 MG tablet Take 1 tablet (0.5 mg total) by mouth 3 (three) times daily. 12/17/13  Yes Newt Lukes, MD  aspirin 325 MG tablet Take 325 mg by mouth daily.   Yes Historical Provider, MD  baclofen (LIORESAL) 10 MG tablet Take 1 tablet (10 mg total) by mouth 4 (four) times daily as needed for muscle spasms. 07/04/14  Yes [name], MD  benzonatate (TESSALON) 100 MG capsule Take 2 capsules (200 mg total) by mouth at bedtime as needed for cough. 06/30/14  Yes [name], MD  calcium carbonate (OS-CAL) 600 MG TABS Take 600 mg by mouth every other day.    Yes Historical Provider, MD  carvedilol (COREG) 6.25 MG tablet Take 6.25 mg by mouth 2 (two) times daily with a meal.   Yes Historical Provider, MD  Cholecalciferol (VITAMIN D3) 5000 UNITS TABS Take 5,000 Units by mouth every other day.    Yes Historical Provider, MD  cyanocobalamin 500 MCG tablet Take 500 mcg by mouth daily.   Yes Historical Provider, MD  fexofenadine (ALLEGRA) 180 MG tablet Take 1 tablet (180 mg total) by mouth daily. 04/25/14  Yes [name], MD  fluticasone (FLONASE) 50 MCG/ACT nasal spray Place 1 spray into both nostrils daily. 04/15/14  Yes [name], MD  furosemide (LASIX) 20 MG tablet Take 20 mg by mouth every morning.    Yes Historical Provider, MD  gabapentin (NEURONTIN) 100 MG capsule Take 2 capsules (200 mg total) by mouth 3 (three) times daily. 07/04/14  Yes [name], MD  haloperidol (HALDOL) 0.5 MG tablet Take 1 tablet (0.5 mg total) by mouth 3 (three) times daily. 05/06/14  Yes Flint Melter, MD  levothyroxine (SYNTHROID, LEVOTHROID) 175 MCG tablet Take 87.5-175 mcg by mouth daily before breakfast. Take 1 tab ( ) on Sunday, Monday, Wednesday, Friday, and Saturday. Take 0.5 tab (87.70mcg) on Tuesday and Thursday.    Yes Historical Provider, MD  losartan-hydrochlorothiazide (HYZAAR) 50-12.5 MG per tablet Take 1 tablet by mouth daily. 04/11/14  Yes Pecola Lawless, MD  metFORMIN (GLUCOPHAGE-XR) 500 MG 24 hr tablet Take 250 mg by mouth at bedtime. 12/08/13  Yes Newt Lukes, MD  ondansetron (ZOFRAN) 4 MG tablet Take 4 mg by mouth every 8 (eight) hours as needed for nausea or vomiting.   Yes Historical Provider, MD  pantoprazole (PROTONIX) 40 MG tablet Take 40 mg by mouth every other day.  Yes Historical Provider, MD  Polyethyl Glycol-Propyl Glycol (SYSTANE) 0.4-0.3 % SOLN Apply 1 drop to eye 2 (two) times daily as needed (dry eyes).   Yes Historical Provider, MD  potassium chloride SA (K-DUR,KLOR-CON) 20 MEQ tablet Take 1 tablet by mouth  daily   Yes Newt Lukes, MD  sertraline (ZOLOFT) 50 MG tablet Take 50 mg by mouth daily.   Yes Historical Provider, MD   BP 167/69  Pulse 87  Temp(Src) 98.3 F (36.8 C) (Oral)  Resp 21  SpO2 94% Physical Exam  Nursing note and vitals reviewed. Constitutional: She appears well-developed and well-nourished.  HENT:  Head: Normocephalic and atraumatic.  Eyes: EOM are normal. Pupils are equal, round, and reactive to light.  Neck: Normal range of motion. Neck supple.  Cardiovascular: Normal rate, regular rhythm and normal heart sounds.   No murmur heard. Pulmonary/Chest: Effort normal and breath sounds normal. No respiratory distress. She has no wheezes. She has no rales.  Abdominal: Soft. Bowel sounds are normal. She exhibits no distension. There is no tenderness. There is no rebound and no guarding.  Musculoskeletal: Normal range of motion.  Neurological: She is alert. No cranial nerve deficit.  Patient is oriented to self. She knows that she is at [facility] She cannot tell me the date but knows it is 2000 something. She does not recognize her husband  Skin: Skin is warm and dry.  Psychiatric: She has a normal mood and affect. Her speech is  normal.    ED Course  Procedures (including critical care time) Labs Review Labs Reviewed  CBC WITH DIFFERENTIAL - Abnormal; Notable for the following:    WBC 12.3 (*)    RBC 3.29 (*)    Hemoglobin 11.0 (*)    HCT 34.7 (*)    MCV 105.5 (*)    RDW 16.0 (*)    Neutro Abs 7.9 (*)    All other components within normal limits  URINALYSIS, ROUTINE W REFLEX MICROSCOPIC - Abnormal; Notable for the following:    APPearance CLOUDY (*)    Leukocytes, UA SMALL (*)    All other components within normal limits  BASIC METABOLIC PANEL - Abnormal; Notable for the following:    Glucose, Bld 159 (*)    GFR calc non Af Amer 56 (*)    GFR calc Af Amer 65 (*)    All other components within normal limits  URINE MICROSCOPIC-ADD ON    Imaging Review Dg Chest Port 1 View  07/09/2014   CLINICAL DATA:  Altered mental status.  EXAM: PORTABLE CHEST - 1 VIEW  COMPARISON:  05/06/2014  FINDINGS: Cardiac enlargement without vascular congestion. No focal airspace disease or consolidation in the lungs. No blunting of costophrenic angles. No pneumothorax. Calcified and tortuous aorta. Degenerative changes in the spine and shoulders.  IMPRESSION: Cardiac enlargement.  No evidence of active pulmonary disease.   Electronically Signed   By: Burman Nieves M.D.   On: 07/09/2014 01:14     EKG Interpretation   Date/Time:  Saturday July 09 2014 01:03:42 EDT Ventricular Rate:  81 PR Interval:  275 QRS Duration: 89 QT Interval:  410 QTC Calculation: 476 R Axis:   61 Text Interpretation:  Sinus rhythm Prolonged PR interval Nonspecific T  abnrm, anterolateral leads No significant change since last tracing  Confirmed by Rubin Payor  MD, Harrold Donath [id number]) on 07/09/2014 3:43:46 AM      MDM   Final diagnoses:  Dementia, without behavioral disturbance    Patient with  dementia. Has had episodes in past where she hasn't recognized her husband, but it began tonight it has been persistent. She knows she is married but  states the man with her is not her husband. Lab work overall reassuring. Does not have clear other cause for delirium. Likely worsening of the dementia. Another sheet head CT at this time since his been more chronic problem. There's been no trauma. Will get patient seen by social work or TTS and she may need placement.    Juliet Rude. Rubin Payor, MD 07/09/14 [phone]

## 2014-07-09 NOTE — Progress Notes (Signed)
The following gero-psych facilities have been contacted to initiate inpt placment on pt's behalf:  REFERRAL FAXED: [facility]- Per Sherryll Burger beds available [facility]- per [facility] beds available [facility]- per [facility] possible bed available  [facility] [place]: [facility]- per [facility]- per [facility] Disposition MHT
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
## 2014-07-09 NOTE — ED Provider Notes (Addendum)
Given clinical information by Dr. Rubin Payor.  He requests MSW eval, and TTS evaluation. His assessment is that patient with progressing dementia and will likely require placement.  I was contacted by Baxter Hire from [facility], TTS initiated.  Rolland Porter, MD 07/09/14 1610  Rolland Porter, MD 07/09/14 [phone]

## 2014-07-10 ENCOUNTER — Encounter (HOSPITAL_COMMUNITY): Payer: Self-pay | Admitting: Emergency Medicine

## 2014-07-10 DIAGNOSIS — F0390 Unspecified dementia without behavioral disturbance: Secondary | ICD-10-CM | POA: Diagnosis not present

## 2014-07-10 MED ORDER — ACETAMINOPHEN 500 MG PO TABS
500.0000 mg | ORAL_TABLET | ORAL | Status: DC | PRN
  Administered 2014-07-10: 500 mg via ORAL
  Filled 2014-07-10: qty 1

## 2014-07-10 NOTE — ED Notes (Signed)
Pt still talking to husband on phone, repeatedly asking him "when are you coming to see me".

## 2014-07-10 NOTE — ED Notes (Signed)
[employer] at bedside, IVC papers at here and she has signed them.

## 2014-07-10 NOTE — ED Notes (Signed)
Sheriff at bedside to transport pt to [place].

## 2014-07-10 NOTE — ED Notes (Signed)
Spoke with Delorise Shiner from [facility], she has received the IVC papers, still needs the portion from [employer] once the patient has been served with the papers.

## 2014-07-10 NOTE — ED Notes (Signed)
Pt called out requesting tylenol, sts she has a slight HA and a stuffy nose.

## 2014-07-10 NOTE — ED Notes (Signed)
MD at bedside is going to fill out IVC paperwork and EMTALA for the pt to go to [place].

## 2014-07-10 NOTE — ED Notes (Signed)
Breakfast tray ordered at this time.

## 2014-07-10 NOTE — ED Notes (Signed)
Pt called out asking about her where her pants are, sts " I know they have to be dry by now". Pt informed it is best if she stays in the paper scrubs for right now for her own safety. Pt sts " I don't care, I want my pants now". Pt told she cannot have her pants at this moment.

## 2014-07-10 NOTE — ED Notes (Signed)
Patient continues to yell at this time and is attempting to get out of bed multiple times without calling for assistance. Each time this occurs, this RN is immediately at the patients bedside to assist her. Attempted to reorient patient but is not working at this time-pt is becoming more agitated.

## 2014-07-10 NOTE — ED Notes (Signed)
Pt continues to ask when she is going to go home. sts "you can't make me stay here". Pt informed that for her safety when need her to stay here. Pt returned to room.

## 2014-07-10 NOTE — ED Notes (Signed)
Called sheriff's office to transport pt to [facility]. No answer, message was left on the machine.

## 2014-07-10 NOTE — ED Notes (Addendum)
Per [facility] staff and previous RN - pt has been accepted at [facility], but they are requesting IVC papers on her because she is incapable of making the decision to go voluntarily by herself. Spoke with Dr. Rhunette Croft about this and he is going to come exam then make a decision.

## 2014-07-10 NOTE — ED Notes (Signed)
Husband at bedside to collect all of pt's belongings. Pt recognizing her husband and is speaking with him.

## 2014-07-10 NOTE — ED Notes (Signed)
sheriffs office called back and will come transport pt a little after 4 pm today. Pt and husband updated.

## 2014-07-10 NOTE — ED Notes (Signed)
Pt asking to leave, sts " Im too sick to stay here, I need to go home and rest". Pt reminded she is at the hospital and is safe here.

## 2014-07-10 NOTE — ED Notes (Signed)
Spoke with Delorise Shiner at [facility] center. They are still accepting the pt pending the IVC papers. Will fax papers to them as soon as they are completed.

## 2014-07-10 NOTE — ED Notes (Signed)
Patient is requesting to sit in a chair- this RN brought a recliner into patients room and assisted her to sit in it at this time. No acute distress noted at this time, patient is watching tv.

## 2014-07-10 NOTE — ED Notes (Signed)
Pt requesting to call her husband. Pt assisted in dialing his phone number.

## 2014-07-10 NOTE — ED Notes (Signed)
IVC papers are completed and have been faxed to the Magistrate.

## 2014-07-10 NOTE — ED Notes (Signed)
Patient continues to get to edge of bed without calling for assistance. Each time this RN enters room to see what the patient is requesting. Patient yells, "bring me my clothes and call my husband to come get me." This RN explained to patient that she could not leave at this time for her safety. Patient assisted back to bed but continues to yell.

## 2014-07-10 NOTE — ED Notes (Signed)
Pt asking if she can go to the facility in her own car. Pt informed that is not allowed and not safe for her.
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
## 2014-07-10 NOTE — BH Assessment (Signed)
Received call from [place] at [facility] who said they are accepting Pt but need a copy of IVC paperwork. Notified Gwenyth Allegra, RN of request and she will discuss case with EDP.  Harlin Rain Ria Comment, [facility] Triage Specialist [phone]

## 2014-07-10 NOTE — ED Notes (Signed)
Signed IVC papers have been faxed to [facility] behavorial unit. Confirmation faxed returned showing a successful transmission.

## 2014-07-10 NOTE — ED Notes (Signed)
Provider made aware of pt's request for pain medicine.

## 2014-07-11 LAB — CBG MONITORING, ED: GLUCOSE-CAPILLARY: 173 mg/dL — AB (ref 70–99)

## 2014-07-20 ENCOUNTER — Ambulatory Visit: Payer: Medicare Other | Admitting: Psychology

## 2014-07-27 ENCOUNTER — Telehealth: Payer: Self-pay | Admitting: Family Medicine

## 2014-07-27 NOTE — Telephone Encounter (Signed)
Paula [name], pt's spouse called to cancel 08/02/14 medicare wellness visit.  Pt is in [facility] in [facility] unit.  He states that she is probably not going to be allowed to come home.  Did not elaborate on reason, just wanted Dr. Sharen Hones informed.  Best number to call Paula [name] if needed is [phone] / lt

## 2014-08-02 ENCOUNTER — Encounter: Payer: Medicare Other | Admitting: Family Medicine

## 2014-08-15 ENCOUNTER — Ambulatory Visit: Payer: Medicare Other | Admitting: Podiatry

## 2014-08-15 ENCOUNTER — Inpatient Hospital Stay (HOSPITAL_COMMUNITY)
Admission: EM | Admit: 2014-08-15 | Discharge: 2014-08-18 | DRG: 189 | Disposition: A | Discharge status: Skilled Nursing Facility | Payer: Medicare Other | Attending: Internal Medicine | Admitting: Internal Medicine

## 2014-08-15 ENCOUNTER — Telehealth: Payer: Self-pay | Admitting: *Deleted

## 2014-08-15 ENCOUNTER — Emergency Department (HOSPITAL_COMMUNITY): Payer: Medicare Other

## 2014-08-15 ENCOUNTER — Encounter (HOSPITAL_COMMUNITY): Payer: Self-pay | Admitting: Emergency Medicine

## 2014-08-15 DIAGNOSIS — J189 Pneumonia, unspecified organism: Secondary | ICD-10-CM | POA: Diagnosis present

## 2014-08-15 DIAGNOSIS — H548 Legal blindness, as defined in USA: Secondary | ICD-10-CM | POA: Diagnosis present

## 2014-08-15 DIAGNOSIS — K119 Disease of salivary gland, unspecified: Secondary | ICD-10-CM | POA: Diagnosis present

## 2014-08-15 DIAGNOSIS — I5033 Acute on chronic diastolic (congestive) heart failure: Secondary | ICD-10-CM | POA: Diagnosis present

## 2014-08-15 DIAGNOSIS — K219 Gastro-esophageal reflux disease without esophagitis: Secondary | ICD-10-CM | POA: Diagnosis present

## 2014-08-15 DIAGNOSIS — Z9889 Other specified postprocedural states: Secondary | ICD-10-CM

## 2014-08-15 DIAGNOSIS — F32A Depression, unspecified: Secondary | ICD-10-CM

## 2014-08-15 DIAGNOSIS — J479 Bronchiectasis, uncomplicated: Secondary | ICD-10-CM | POA: Diagnosis present

## 2014-08-15 DIAGNOSIS — J45909 Unspecified asthma, uncomplicated: Secondary | ICD-10-CM | POA: Diagnosis present

## 2014-08-15 DIAGNOSIS — Z66 Do not resuscitate: Secondary | ICD-10-CM | POA: Diagnosis present

## 2014-08-15 DIAGNOSIS — F329 Major depressive disorder, single episode, unspecified: Secondary | ICD-10-CM | POA: Diagnosis present

## 2014-08-15 DIAGNOSIS — E538 Deficiency of other specified B group vitamins: Secondary | ICD-10-CM | POA: Diagnosis present

## 2014-08-15 DIAGNOSIS — Z881 Allergy status to other antibiotic agents status: Secondary | ICD-10-CM

## 2014-08-15 DIAGNOSIS — J9621 Acute and chronic respiratory failure with hypoxia: Principal | ICD-10-CM | POA: Diagnosis present

## 2014-08-15 DIAGNOSIS — D72829 Elevated white blood cell count, unspecified: Secondary | ICD-10-CM | POA: Diagnosis present

## 2014-08-15 DIAGNOSIS — Z950 Presence of cardiac pacemaker: Secondary | ICD-10-CM | POA: Diagnosis not present

## 2014-08-15 DIAGNOSIS — Z8249 Family history of ischemic heart disease and other diseases of the circulatory system: Secondary | ICD-10-CM

## 2014-08-15 DIAGNOSIS — F419 Anxiety disorder, unspecified: Secondary | ICD-10-CM | POA: Diagnosis present

## 2014-08-15 DIAGNOSIS — Z880 Allergy status to penicillin: Secondary | ICD-10-CM

## 2014-08-15 DIAGNOSIS — I5031 Acute diastolic (congestive) heart failure: Secondary | ICD-10-CM | POA: Insufficient documentation

## 2014-08-15 DIAGNOSIS — F03918 Unspecified dementia, unspecified severity, with other behavioral disturbance: Secondary | ICD-10-CM | POA: Insufficient documentation

## 2014-08-15 DIAGNOSIS — Z515 Encounter for palliative care: Secondary | ICD-10-CM | POA: Insufficient documentation

## 2014-08-15 DIAGNOSIS — J9600 Acute respiratory failure, unspecified whether with hypoxia or hypercapnia: Secondary | ICD-10-CM | POA: Diagnosis present

## 2014-08-15 DIAGNOSIS — E1142 Type 2 diabetes mellitus with diabetic polyneuropathy: Secondary | ICD-10-CM | POA: Diagnosis present

## 2014-08-15 DIAGNOSIS — F0391 Unspecified dementia with behavioral disturbance: Secondary | ICD-10-CM | POA: Diagnosis present

## 2014-08-15 DIAGNOSIS — J441 Chronic obstructive pulmonary disease with (acute) exacerbation: Secondary | ICD-10-CM | POA: Diagnosis present

## 2014-08-15 DIAGNOSIS — I5032 Chronic diastolic (congestive) heart failure: Secondary | ICD-10-CM

## 2014-08-15 DIAGNOSIS — Z87891 Personal history of nicotine dependence: Secondary | ICD-10-CM | POA: Diagnosis not present

## 2014-08-15 DIAGNOSIS — I10 Essential (primary) hypertension: Secondary | ICD-10-CM | POA: Diagnosis present

## 2014-08-15 DIAGNOSIS — H3530 Unspecified macular degeneration: Secondary | ICD-10-CM | POA: Diagnosis present

## 2014-08-15 DIAGNOSIS — F0390 Unspecified dementia without behavioral disturbance: Secondary | ICD-10-CM

## 2014-08-15 DIAGNOSIS — F29 Unspecified psychosis not due to a substance or known physiological condition: Secondary | ICD-10-CM | POA: Diagnosis present

## 2014-08-15 DIAGNOSIS — Y92003 Bedroom of unspecified non-institutional (private) residence as the place of occurrence of the external cause: Secondary | ICD-10-CM

## 2014-08-15 DIAGNOSIS — I714 Abdominal aortic aneurysm, without rupture: Secondary | ICD-10-CM | POA: Diagnosis present

## 2014-08-15 DIAGNOSIS — Z882 Allergy status to sulfonamides status: Secondary | ICD-10-CM

## 2014-08-15 DIAGNOSIS — Z9049 Acquired absence of other specified parts of digestive tract: Secondary | ICD-10-CM | POA: Diagnosis present

## 2014-08-15 DIAGNOSIS — J439 Emphysema, unspecified: Secondary | ICD-10-CM

## 2014-08-15 DIAGNOSIS — G9340 Encephalopathy, unspecified: Secondary | ICD-10-CM | POA: Diagnosis present

## 2014-08-15 DIAGNOSIS — Z79899 Other long term (current) drug therapy: Secondary | ICD-10-CM

## 2014-08-15 DIAGNOSIS — F22 Delusional disorders: Secondary | ICD-10-CM | POA: Diagnosis present

## 2014-08-15 DIAGNOSIS — Z885 Allergy status to narcotic agent status: Secondary | ICD-10-CM

## 2014-08-15 DIAGNOSIS — E039 Hypothyroidism, unspecified: Secondary | ICD-10-CM | POA: Diagnosis present

## 2014-08-15 DIAGNOSIS — Z888 Allergy status to other drugs, medicaments and biological substances status: Secondary | ICD-10-CM | POA: Diagnosis not present

## 2014-08-15 DIAGNOSIS — Z8673 Personal history of transient ischemic attack (TIA), and cerebral infarction without residual deficits: Secondary | ICD-10-CM | POA: Diagnosis not present

## 2014-08-15 DIAGNOSIS — M47896 Other spondylosis, lumbar region: Secondary | ICD-10-CM | POA: Diagnosis present

## 2014-08-15 DIAGNOSIS — W19XXXA Unspecified fall, initial encounter: Secondary | ICD-10-CM

## 2014-08-15 DIAGNOSIS — Z91041 Radiographic dye allergy status: Secondary | ICD-10-CM | POA: Diagnosis not present

## 2014-08-15 DIAGNOSIS — Y95 Nosocomial condition: Secondary | ICD-10-CM | POA: Diagnosis present

## 2014-08-15 DIAGNOSIS — J9601 Acute respiratory failure with hypoxia: Secondary | ICD-10-CM | POA: Insufficient documentation

## 2014-08-15 DIAGNOSIS — E1151 Type 2 diabetes mellitus with diabetic peripheral angiopathy without gangrene: Secondary | ICD-10-CM

## 2014-08-15 DIAGNOSIS — Z7982 Long term (current) use of aspirin: Secondary | ICD-10-CM | POA: Diagnosis not present

## 2014-08-15 LAB — CBC WITH DIFFERENTIAL/PLATELET
BASOS PCT: 0 % (ref 0–1)
Basophils Absolute: 0 10*3/uL (ref 0.0–0.1)
EOS PCT: 6 % — AB (ref 0–5)
Eosinophils Absolute: 0.7 10*3/uL (ref 0.0–0.7)
HCT: 32.9 % — ABNORMAL LOW (ref 36.0–46.0)
HEMOGLOBIN: 10.2 g/dL — AB (ref 12.0–15.0)
LYMPHS PCT: 25 % (ref 12–46)
Lymphs Abs: 2.9 10*3/uL (ref 0.7–4.0)
MCH: 33.3 pg (ref 26.0–34.0)
MCHC: 31 g/dL (ref 30.0–36.0)
MCV: 107.5 fL — ABNORMAL HIGH (ref 78.0–100.0)
Monocytes Absolute: 1.3 10*3/uL — ABNORMAL HIGH (ref 0.1–1.0)
Monocytes Relative: 11 % (ref 3–12)
NEUTROS PCT: 58 % (ref 43–77)
Neutro Abs: 6.5 10*3/uL (ref 1.7–7.7)
Platelets: 268 10*3/uL (ref 150–400)
RBC: 3.06 MIL/uL — ABNORMAL LOW (ref 3.87–5.11)
RDW: 17.1 % — ABNORMAL HIGH (ref 11.5–15.5)
WBC: 11.4 10*3/uL — ABNORMAL HIGH (ref 4.0–10.5)

## 2014-08-15 LAB — COMPREHENSIVE METABOLIC PANEL
ALT: 12 U/L (ref 0–35)
AST: 27 U/L (ref 0–37)
Albumin: 3.3 g/dL — ABNORMAL LOW (ref 3.5–5.2)
Alkaline Phosphatase: 64 U/L (ref 39–117)
Anion gap: 11 (ref 5–15)
BUN: 21 mg/dL (ref 6–23)
CO2: 29 meq/L (ref 19–32)
Calcium: 8.6 mg/dL (ref 8.4–10.5)
Chloride: 105 mEq/L (ref 96–112)
Creatinine, Ser: 1.09 mg/dL (ref 0.50–1.10)
GFR, EST AFRICAN AMERICAN: 52 mL/min — AB (ref >90)
GFR, EST NON AFRICAN AMERICAN: 45 mL/min — AB (ref >90)
GLUCOSE: 132 mg/dL — AB (ref 70–99)
POTASSIUM: 4.7 meq/L (ref 3.7–5.3)
SODIUM: 145 meq/L (ref 137–147)
Total Bilirubin: 0.3 mg/dL (ref 0.3–1.2)
Total Protein: 6.4 g/dL (ref 6.0–8.3)

## 2014-08-15 LAB — PRO B NATRIURETIC PEPTIDE: Pro B Natriuretic peptide (BNP): 171.4 pg/mL (ref 0–450)

## 2014-08-15 LAB — CBG MONITORING, ED: Glucose-Capillary: 115 mg/dL — ABNORMAL HIGH (ref 70–99)

## 2014-08-15 MED ORDER — POTASSIUM CHLORIDE CRYS ER 20 MEQ PO TBCR: 20.0000 meq | EXTENDED_RELEASE_TABLET | Freq: Every day | ORAL | Status: DC

## 2014-08-15 MED ORDER — SODIUM CHLORIDE 0.9 % IJ SOLN
3.0000 mL | INTRAMUSCULAR | Status: DC | PRN
Start: 2014-08-15 — End: 2014-08-18

## 2014-08-15 MED ORDER — HEPARIN SODIUM (PORCINE) 5000 UNIT/ML IJ SOLN
5000.0000 [IU] | Freq: Three times a day (TID) | INTRAMUSCULAR | Status: DC
  Administered 2014-08-16: 5000 [IU] via SUBCUTANEOUS
  Filled 2014-08-15: qty 1

## 2014-08-15 MED ORDER — TRAZODONE HCL 150 MG PO TABS
150.0000 mg | ORAL_TABLET | Freq: Every day | ORAL | Status: DC
  Administered 2014-08-16 – 2014-08-17 (×3): 150 mg via ORAL
  Filled 2014-08-15 (×4): qty 1

## 2014-08-15 MED ORDER — HYDROCHLOROTHIAZIDE 12.5 MG PO CAPS
12.5000 mg | ORAL_CAPSULE | Freq: Every day | ORAL | Status: DC
  Administered 2014-08-16: 12.5 mg via ORAL
  Filled 2014-08-15 (×2): qty 1

## 2014-08-15 MED ORDER — SODIUM CHLORIDE 0.9 % IJ SOLN
3.0000 mL | Freq: Two times a day (BID) | INTRAMUSCULAR | Status: DC
  Administered 2014-08-16 – 2014-08-18 (×3): 3 mL via INTRAVENOUS

## 2014-08-15 MED ORDER — DEXTROSE 5 % IV SOLN
2.0000 g | Freq: Three times a day (TID) | INTRAVENOUS | Status: DC
  Administered 2014-08-16 (×2): 2 g via INTRAVENOUS
  Filled 2014-08-15 (×4): qty 2

## 2014-08-15 MED ORDER — OLANZAPINE 5 MG PO TABS
5.0000 mg | ORAL_TABLET | Freq: Two times a day (BID) | ORAL | Status: DC
  Administered 2014-08-16 – 2014-08-18 (×6): 5 mg via ORAL
  Filled 2014-08-15 (×7): qty 1

## 2014-08-15 MED ORDER — POLYVINYL ALCOHOL 1.4 % OP SOLN
1.0000 [drp] | Freq: Two times a day (BID) | OPHTHALMIC | Status: DC | PRN
  Filled 2014-08-15: qty 15

## 2014-08-15 MED ORDER — POLYETHYL GLYCOL-PROPYL GLYCOL 0.4-0.3 % OP SOLN: 1.0000 [drp] | Freq: Two times a day (BID) | OPHTHALMIC | Status: DC | PRN

## 2014-08-15 MED ORDER — LEVOTHYROXINE SODIUM 175 MCG PO TABS
175.0000 ug | ORAL_TABLET | ORAL | Status: DC
  Administered 2014-08-17: 175 ug via ORAL
  Filled 2014-08-15 (×2): qty 1

## 2014-08-15 MED ORDER — LOSARTAN POTASSIUM-HCTZ 50-12.5 MG PO TABS: 1.0000 | ORAL_TABLET | Freq: Every day | ORAL | Status: DC

## 2014-08-15 MED ORDER — INSULIN ASPART 100 UNIT/ML ~~LOC~~ SOLN
0.0000 [IU] | SUBCUTANEOUS | Status: DC
  Administered 2014-08-16: 3 [IU] via SUBCUTANEOUS
  Administered 2014-08-16: 2 [IU] via SUBCUTANEOUS
  Administered 2014-08-17: 3 [IU] via SUBCUTANEOUS
  Administered 2014-08-17 (×2): 1 [IU] via SUBCUTANEOUS

## 2014-08-15 MED ORDER — SERTRALINE HCL 50 MG PO TABS: 50.0000 mg | ORAL_TABLET | Freq: Every day | ORAL | Status: DC

## 2014-08-15 MED ORDER — LOSARTAN POTASSIUM 50 MG PO TABS
50.0000 mg | ORAL_TABLET | Freq: Every day | ORAL | Status: DC
  Administered 2014-08-16: 50 mg via ORAL
  Filled 2014-08-15 (×2): qty 1

## 2014-08-15 MED ORDER — METHYLPREDNISOLONE SODIUM SUCC 125 MG IJ SOLR
125.0000 mg | Freq: Once | INTRAMUSCULAR | Status: AC
  Administered 2014-08-16: 125 mg via INTRAVENOUS
  Filled 2014-08-15: qty 2

## 2014-08-15 MED ORDER — DIVALPROEX SODIUM 125 MG PO DR TAB
125.0000 mg | DELAYED_RELEASE_TABLET | Freq: Three times a day (TID) | ORAL | Status: DC
  Administered 2014-08-16 – 2014-08-18 (×3): 125 mg via ORAL
  Filled 2014-08-15 (×11): qty 1

## 2014-08-15 MED ORDER — SODIUM CHLORIDE 0.9 % IV SOLN: 250.0000 mL | INTRAVENOUS | Status: DC | PRN

## 2014-08-15 MED ORDER — VANCOMYCIN HCL IN DEXTROSE 750-5 MG/150ML-% IV SOLN
750.0000 mg | Freq: Two times a day (BID) | INTRAVENOUS | Status: DC
  Administered 2014-08-16: 750 mg via INTRAVENOUS
  Filled 2014-08-15 (×4): qty 150

## 2014-08-15 MED ORDER — SERTRALINE HCL 50 MG PO TABS
50.0000 mg | ORAL_TABLET | Freq: Every day | ORAL | Status: DC
  Administered 2014-08-16 – 2014-08-17 (×2): 50 mg via ORAL
  Filled 2014-08-15 (×2): qty 1

## 2014-08-15 MED ORDER — IPRATROPIUM-ALBUTEROL 0.5-2.5 (3) MG/3ML IN SOLN: 3.0000 mL | RESPIRATORY_TRACT | Status: DC | PRN

## 2014-08-15 MED ORDER — FUROSEMIDE 10 MG/ML IJ SOLN
20.0000 mg | Freq: Once | INTRAMUSCULAR | Status: AC
  Administered 2014-08-15: 20 mg via INTRAVENOUS
  Filled 2014-08-15: qty 2

## 2014-08-15 MED ORDER — IPRATROPIUM-ALBUTEROL 0.5-2.5 (3) MG/3ML IN SOLN
3.0000 mL | RESPIRATORY_TRACT | Status: DC
  Administered 2014-08-16: 3 mL via RESPIRATORY_TRACT
  Filled 2014-08-15: qty 3

## 2014-08-15 MED ORDER — PANTOPRAZOLE SODIUM 40 MG PO TBEC
40.0000 mg | DELAYED_RELEASE_TABLET | ORAL | Status: DC
  Administered 2014-08-16 – 2014-08-17 (×2): 40 mg via ORAL
  Filled 2014-08-15 (×2): qty 1

## 2014-08-15 MED ORDER — POTASSIUM CHLORIDE CRYS ER 20 MEQ PO TBCR
20.0000 meq | EXTENDED_RELEASE_TABLET | Freq: Every day | ORAL | Status: DC
  Administered 2014-08-16 – 2014-08-18 (×3): 20 meq via ORAL
  Filled 2014-08-15 (×3): qty 1

## 2014-08-15 MED ORDER — LEVOTHYROXINE SODIUM 175 MCG PO TABS
87.5000 ug | ORAL_TABLET | Freq: Every day | ORAL | Status: DC
  Filled 2014-08-15: qty 1

## 2014-08-15 MED ORDER — SODIUM CHLORIDE 0.9 % IJ SOLN
3.0000 mL | Freq: Two times a day (BID) | INTRAMUSCULAR | Status: DC
  Administered 2014-08-16 – 2014-08-18 (×5): 3 mL via INTRAVENOUS

## 2014-08-15 MED ORDER — GABAPENTIN 100 MG PO CAPS
200.0000 mg | ORAL_CAPSULE | Freq: Three times a day (TID) | ORAL | Status: DC
  Administered 2014-08-16 – 2014-08-17 (×5): 200 mg via ORAL
  Filled 2014-08-15 (×7): qty 2

## 2014-08-15 MED ORDER — ASPIRIN 325 MG PO TABS
325.0000 mg | ORAL_TABLET | Freq: Every day | ORAL | Status: DC
  Administered 2014-08-16 – 2014-08-18 (×4): 325 mg via ORAL
  Filled 2014-08-15 (×4): qty 1

## 2014-08-15 MED ORDER — RIVASTIGMINE 4.6 MG/24HR TD PT24
4.6000 mg | MEDICATED_PATCH | Freq: Every morning | TRANSDERMAL | Status: DC
  Administered 2014-08-16 – 2014-08-18 (×3): 4.6 mg via TRANSDERMAL
  Filled 2014-08-15 (×3): qty 1

## 2014-08-15 MED ORDER — LEVOTHYROXINE SODIUM 175 MCG PO TABS
87.5000 ug | ORAL_TABLET | ORAL | Status: DC
  Administered 2014-08-16 – 2014-08-18 (×2): 87.5 ug via ORAL
  Filled 2014-08-15 (×2): qty 0.5

## 2014-08-15 NOTE — ED Notes (Signed)
Attempted IV insertion x 2, Kim RN to assess

## 2014-08-15 NOTE — Progress Notes (Signed)
ANTIBIOTIC CONSULT NOTE - INITIAL  Pharmacy Consult for Levaquin Indication: pneumonia  Allergies  Allergen Reactions  . Bactrim [Sulfamethoxazole-Tmp Ds] Anaphylaxis  . Contrast Media [Iodinated Diagnostic Agents] Anaphylaxis and Swelling  . Iodine Anaphylaxis  . Iohexol Anaphylaxis, Shortness Of Breath and Swelling     Desc: PT STATES SHE HAD A SEVERE REACTION TO IV CONRAST WITH THROAT SWELLING AND SOB. SHE WAS ADMITTED TO THE HOSPITAL. SHE HAS NEVER HAD CONTRAST AGAIN.   Marland Kitchen Sulfa Antibiotics Anaphylaxis  . Aricept [Donepezil Hcl] Nausea And Vomiting  . Cymbalta [Duloxetine Hcl] Other (See Comments)    HALLUCINATIONS    . Delsym [Dextromethorphan] Other (See Comments)    Hallucination  . Guaifenesin & Derivatives Other (See Comments)    Hallucination  . Hydromet [Hydrocodone-Homatropine] Other (See Comments)    hallucinations  . Avelox [Moxifloxacin Hcl In Nacl] Rash  . Ciprofloxacin Itching  . Gadolinium Derivatives Other (See Comments)    Unknown    . Penicillins Rash    Rash as a child  . Quinolones Rash    Patient Measurements:   Adjusted Body Weight: n/a  Vital Signs: Temp: 98.8 F (37.1 C) (10/05 1721) Temp Source: Oral (10/05 1721) BP: 107/44 mmHg (10/05 2100) Pulse Rate: 62 (10/05 2100) Intake/Output from previous day:   Intake/Output from this shift:    Labs:  Recent Labs  08/15/14 1735  WBC 11.4*  HGB 10.2*  PLT 268  CREATININE 1.09   The CrCl is unknown because both a height and weight (above a minimum accepted value) are required for this calculation. No results found for this basename: VANCOTROUGH, VANCOPEAK, VANCORANDOM, GENTTROUGH, GENTPEAK, GENTRANDOM, TOBRATROUGH, TOBRAPEAK, TOBRARND, AMIKACINPEAK, AMIKACINTROU, AMIKACIN,  in the last 72 hours   Microbiology: No results found for this or any previous visit (from the past 720 hour(s)).  Medical History: Past Medical History  Diagnosis Date  . Depression   . Ejection fraction    EF 40%, echo, November, 2012  / in improved, EF 60%, echo, December, 2012  . Contrast media allergy     Patient feels poorly with contrast  . Status post AAA (abdominal aortic aneurysm) repair     Surgical repair, Dr. Hart Rochester, December 27, 2011  . Pre-syncope     vasovagal w/ heart block  . Parotid mass     has refused further eval 10/2011  . GERD (gastroesophageal reflux disease)   . Anxiety   . Facial tic     L sided spasms - Botox trial summer 2013, ?effective  . ARMD (age related macular degeneration) 06/2014    bilateral vision loss - legally blind  . AAA (abdominal aortic aneurysm)     s/p repair 12/2011  . Congestive heart failure     NL EF 10/2011 echo   . Diabetes mellitus type II, controlled   . Hypothyroidism   . Sinus bradycardia 09/19/2011    Occurring simultaneously with complete heart block   . Hypertension   . COPD (chronic obstructive pulmonary disease)   . Facial spasm     L hemifacial-treated w/ botox xeomin -Dr Terrace Arabia  . Dementia     MRI with progressive chronic microvascular ischemia and generalized cerebral atrophy  . Lumbosacral spondylosis without myelopathy   . Legally blind 06/2014    due to ARMD    Medications:  Scheduled:  . aspirin  325 mg Oral Daily  . aztreonam  2 g Intravenous 3 times per day  . [START ON 08/16/2014] divalproex  125 mg Oral TID  WC  . gabapentin  200 mg Oral TID  . heparin  5,000 Units Subcutaneous 3 times per day  . insulin aspart  0-9 Units Subcutaneous 6 times per day  . ipratropium-albuterol  3 mL Nebulization Q4H  . [START ON 08/16/2014] levothyroxine  87.5-175 mcg Oral QAC breakfast  . methylPREDNISolone (SOLU-MEDROL) injection  125 mg Intravenous Once  . OLANZapine  5 mg Oral BID  . pantoprazole  40 mg Oral QODAY  . potassium chloride SA  20 mEq Oral Daily  . [START ON 08/16/2014] rivastigmine  4.6 mg Transdermal q morning - 10a  . sertraline  50 mg Oral Daily  . sodium chloride  3 mL Intravenous Q12H  . sodium chloride  3  mL Intravenous Q12H  . traZODone  150 mg Oral QHS   Assessment: 80 yo female admitted with respiratory failure, multiple medical problems.  Pharmacy asked to begin empiric vancomycin for pneumonia.  Also with aztreonam per MD (2g q 8 hrs is appropriate).  Estimated CrCl ~ 43 ml/min.  As of 07/04/14, weight =  82.7 kg  Goal of Therapy:  Vancomycin trough level 15-20 mcg/ml  Plan:  1. Vancomycin 750 mg IV q 12 hrs. 2. Will f/u cultures, renal function, and clinical course. 3. Vancomycin trough at steady state.  Tad MooreJessica Townes Fuhs, Pharm D, BCPS  Clinical Pharmacist Pager [phone]  08/15/2014 9:47 PM
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
## 2014-08-15 NOTE — Telephone Encounter (Signed)
Called, no one home.

## 2014-08-15 NOTE — ED Provider Notes (Signed)
CSN: [id number]     Arrival date & time 08/15/14  1706 History   First MD Initiated Contact with Patient 08/15/14 1708     Chief Complaint  Patient presents with  . Leg Swelling  . Fall   Patient is a 80 y.o. female presenting with general illness. The history is provided by the EMS personnel and a relative.  Illness Location:  At home Severity:  Moderate Onset quality:  Sudden Duration:  1 day Timing:  Constant Progression:  Worsening Chronicity:  New Context:  Recently came home from nursing home; has been weak at home; has increased leg swelling Relieved by:  Nothing Worsened by:  Nothing Ineffective treatments:  Nothing tried Risk factors:  Severe dementia  80 year old Caucasian female with history of severe dementia, congestive heart failure, hypertension who presents by EMS for leg swelling. The patient lives at home with her husband who states that the patient fell around 1 AM. However he did not call EMS until this morning. Ashby Dawes of the fall is unknown. Patient is severely demented and unable to provide any history. Past Medical History  Diagnosis Date  . Depression   . Ejection fraction     EF 40%, echo, November, 2012  / in improved, EF 60%, echo, December, 2012  . Contrast media allergy     Patient feels poorly with contrast  . Status post AAA (abdominal aortic aneurysm) repair     Surgical repair, Dr. Hart [place], December 27, 2011  . Pre-syncope     vasovagal w/ heart block  . Parotid mass     has refused further eval 10/2011  . GERD (gastroesophageal reflux disease)   . Anxiety   . Facial tic     L sided spasms - Botox trial summer 2013, ?effective  . ARMD (age related macular degeneration) 06/2014    bilateral vision loss - legally blind  . AAA (abdominal aortic aneurysm)     s/p repair 12/2011  . Congestive heart failure     NL EF 10/2011 echo   . Diabetes mellitus type II, controlled   . Hypothyroidism   . Sinus bradycardia 09/19/2011    Occurring  simultaneously with complete heart block   . Hypertension   . COPD (chronic obstructive pulmonary disease)   . Facial spasm     L hemifacial-treated w/ botox xeomin -Dr Terrace Arabia  . Dementia     MRI with progressive chronic microvascular ischemia and generalized cerebral atrophy  . Lumbosacral spondylosis without myelopathy   . Legally blind 06/2014    due to ARMD   Past Surgical History  Procedure Laterality Date  . Cholecystectomy    . Knee cartilage surgery      left  . Sinus surgery with instatrak    . Appendectomy    . Oophorectomy      ovarian cyst  . Cataract extraction      bilateral  . Bladder repair    . US echocardiography  10/14/11  . Abdominal aortic aneurysm repair  12/27/2011    ANEURYSM ABDOMINAL AORTIC REPAIR;  Surgeon: Josephina Gip, MD; Resection and Grafting Abdominal Aortic Aneurysm , Aorta Bi Iliac.  Marland Kitchen Pacemaker insertion  11/12  . Cardiac catheterization  11/04/11   Family History  Problem Relation Age of Onset  . Esophageal cancer Father   . Cancer Father   . Breast cancer Sister   . Cancer Sister   . Colon cancer Sister   . Heart disease Sister   . Heart disease Mother   .  Heart disease Son   . Parkinson's disease Sister     younger   History  Substance Use Topics  . Smoking status: Former Smoker -- 1.00 packs/day for 60 years    Types: Cigarettes    Quit date: 08/31/2011  . Smokeless tobacco: Never Used  . Alcohol Use: No   OB History   Grav Para Term Preterm Abortions TAB SAB Ect Mult Living                 Review of Systems  Unable to perform ROS: Dementia      Allergies  Bactrim; Contrast media; Iodine; Iohexol; Sulfa antibiotics; Aricept; Cymbalta; Delsym; Guaifenesin & derivatives; Hydromet; Avelox; Ciprofloxacin; Gadolinium derivatives; Penicillins; and Quinolones  Home Medications   Prior to Admission medications   Medication Sig Start Date End Date Taking? Authorizing Provider  acetaminophen (TYLENOL) 500 MG tablet Take 500  mg by mouth every 6 (six) hours as needed (pain).   Yes Historical Provider, MD  albuterol (PROVENTIL HFA;VENTOLIN HFA) 108 (90 BASE) MCG/ACT inhaler Inhale 2 puffs into the lungs every 6 (six) hours as needed for wheezing or shortness of breath. 03/24/14  Yes Newt Lukes, MD  ALPRAZolam Prudy Feeler) 0.5 MG tablet Take 1 tablet (0.5 mg total) by mouth 3 (three) times daily. 12/17/13  Yes Newt Lukes, MD  aspirin 325 MG tablet Take 325 mg by mouth daily.   Yes Historical Provider, MD  baclofen (LIORESAL) 10 MG tablet Take 1 tablet (10 mg total) by mouth 4 (four) times daily as needed for muscle spasms. 07/04/14  Yes [name], MD  benzonatate (TESSALON) 100 MG capsule Take 2 capsules (200 mg total) by mouth at bedtime as needed for cough. 06/30/14  Yes [name], MD  Calcium Carbonate-Vitamin D (CALCARB 600/D) 600-400 MG-UNIT per tablet Take 1 tablet by mouth every other day.    Yes Historical Provider, MD  carvedilol (COREG) 6.25 MG tablet Take 6.25 mg by mouth 2 (two) times daily with a meal.   Yes Historical Provider, MD  Cholecalciferol (VITAMIN D3) 5000 UNITS TABS Take 5,000 Units by mouth every other day.    Yes Historical Provider, MD  cyanocobalamin 500 MCG tablet Take 500 mcg by mouth daily.   Yes Historical Provider, MD  divalproex (DEPAKOTE) 125 MG DR tablet Take 125 mg by mouth 3 (three) times daily with meals.   Yes Historical Provider, MD  fexofenadine (ALLEGRA) 180 MG tablet Take 1 tablet (180 mg total) by mouth daily. 04/25/14  Yes [name], MD  fluticasone (FLONASE) 50 MCG/ACT nasal spray Place 1 spray into both nostrils daily. 04/15/14  Yes [name], MD  furosemide (LASIX) 20 MG tablet Take 20 mg by mouth every morning.    Yes Historical Provider, MD  gabapentin (NEURONTIN) 100 MG capsule Take 200 mg by mouth 3 (three) times daily.    Yes Historical Provider, MD  levothyroxine (SYNTHROID, LEVOTHROID) 175 MCG tablet Take 87.5-175 mcg by mouth daily before  breakfast. Take 1 tab ( ) on Sunday, Monday, Wednesday, Friday, and Saturday. Take 0.5 tab (87.48mcg) on Tuesday and Thursday.   Yes Historical Provider, MD  losartan-hydrochlorothiazide (HYZAAR) 50-12.5 MG per tablet Take 1 tablet by mouth daily. 04/11/14  Yes Pecola Lawless, MD  metFORMIN (GLUCOPHAGE-XR) 500 MG 24 hr tablet Take 250 mg by mouth at bedtime. 12/08/13  Yes Newt Lukes, MD  OLANZapine (ZYPREXA) 5 MG tablet Take 5 mg by mouth 2 (two) times daily.   Yes Historical Provider, MD  Omega-3 Fatty Acids (FISH OIL) 1000 MG CAPS Take 1,000 mg by mouth daily.   Yes Historical Provider, MD  pantoprazole (PROTONIX) 40 MG tablet Take 40 mg by mouth every other day.    Yes Historical Provider, MD  Polyethyl Glycol-Propyl Glycol (SYSTANE) 0.4-0.3 % SOLN Apply 1 drop to eye 2 (two) times daily as needed (dry eyes).   Yes Historical Provider, MD  potassium chloride SA (K-DUR,KLOR-CON) 20 MEQ tablet Take 20 mEq by mouth daily.   Yes Historical Provider, MD  rivastigmine (EXELON) 4.6 mg/24hr Place 4.6 mg onto the skin every morning.   Yes Historical Provider, MD  sertraline (ZOLOFT) 50 MG tablet Take 50 mg by mouth daily.   Yes Historical Provider, MD  traZODone (DESYREL) 150 MG tablet Take 150 mg by mouth at bedtime.   Yes Historical Provider, MD   BP 120/49  Pulse 60  Temp(Src) 97.9 F (36.6 C) (Oral)  Resp 20  Ht 5\' 5"  (1.651 m)  Wt 182 lb 1.6 oz (82.6 kg)  BMI 30.30 kg/m2  SpO2 94% Physical Exam  Constitutional: She appears well-developed and well-nourished. No distress.  Elderly female, no distress  HENT:  Head: Normocephalic and atraumatic.  Mouth/Throat: Oropharynx is clear and moist.  Eyes: EOM are normal. Pupils are equal, round, and reactive to light.  Neck: Neck supple. No JVD present.  Cardiovascular: Normal rate, normal heart sounds and intact distal pulses.  An irregularly irregular rhythm present. Exam reveals no gallop.   No murmur heard. Pulmonary/Chest: Effort  normal and breath sounds normal. She has no wheezes. She has no rales.  Abdominal: Soft. She exhibits no distension. There is no tenderness.  Musculoskeletal: Normal range of motion. She exhibits edema (bilateraly pitting edeam to knees). She exhibits no tenderness.  Neurological: She is alert. She is disoriented (cannot correctly state year, month, or birthday). No cranial nerve deficit or sensory deficit. She exhibits normal muscle tone.  Skin: Skin is warm and dry. No rash noted.  Psychiatric: Her behavior is normal.    ED Course  Procedures (including critical care time) Labs Review Labs Reviewed  CBC WITH DIFFERENTIAL - Abnormal; Notable for the following:    WBC 11.4 (*)    RBC 3.06 (*)    Hemoglobin 10.2 (*)    HCT 32.9 (*)    MCV 107.5 (*)    RDW 17.1 (*)    Eosinophils Relative 6 (*)    Monocytes Absolute 1.3 (*)    All other components within normal limits  COMPREHENSIVE METABOLIC PANEL - Abnormal; Notable for the following:    Glucose, Bld 132 (*)    Albumin 3.3 (*)    GFR calc non Af Amer 45 (*)    GFR calc Af Amer 52 (*)    All other components within normal limits  COMPREHENSIVE METABOLIC PANEL - Abnormal; Notable for the following:    Glucose, Bld 192 (*)    Albumin 3.2 (*)    GFR calc non Af Amer 53 (*)    GFR calc Af Amer 62 (*)    All other components within normal limits  CBC WITH DIFFERENTIAL - Abnormal; Notable for the following:    RBC 3.05 (*)    Hemoglobin 10.0 (*)    HCT 31.8 (*)    MCV 104.3 (*)    RDW 17.1 (*)    Neutrophils Relative % 87 (*)    Lymphocytes Relative 11 (*)    Monocytes Relative 2 (*)    All other components  within normal limits  GLUCOSE, CAPILLARY - Abnormal; Notable for the following:    Glucose-Capillary 115 (*)    All other components within normal limits  GLUCOSE, CAPILLARY - Abnormal; Notable for the following:    Glucose-Capillary 111 (*)    All other components within normal limits  GLUCOSE, CAPILLARY - Abnormal;  Notable for the following:    Glucose-Capillary 175 (*)    All other components within normal limits  CBG MONITORING, ED - Abnormal; Notable for the following:    Glucose-Capillary 115 (*)    All other components within normal limits  URINE CULTURE  CULTURE, BLOOD (ROUTINE X 2)  CULTURE, BLOOD (ROUTINE X 2)  CULTURE, EXPECTORATED SPUTUM-ASSESSMENT  GRAM STAIN  PRO B NATRIURETIC PEPTIDE  URINALYSIS, ROUTINE W REFLEX MICROSCOPIC  TROPONIN I  TROPONIN I  CK TOTAL AND CKMB  AMMONIA  STREP PNEUMONIAE URINARY ANTIGEN  TROPONIN I  HEMOGLOBIN A1C  VITAMIN D 1,25 DIHYDROXY  VITAMIN D 25 HYDROXY  HIV ANTIBODY (ROUTINE TESTING)  LEGIONELLA ANTIGEN, URINE    Imaging Review Dg Chest 2 View  08/15/2014   CLINICAL DATA:  Status post fall last night ; mental status change; history of COPD, CHF, diabetes, and leg swelling  EXAM: CHEST  2 VIEW  COMPARISON:  Portable chest x-ray of July 09, 2014  FINDINGS: The lungs remain mildly hyperinflated. The interstitial markings are mildly increased overall. A small amount of pleural fluid blunts the lateral and posterior costophrenic angles on the left. The cardiac silhouette is enlarged. The pulmonary vascularity is mildly engorged. The bony thorax is unremarkable. The observed portions of the bony thorax exhibit no acute abnormalities.  IMPRESSION: Low-grade CHF superimposed upon COPD. There is a small left pleural effusion. There is no evidence of pneumonia.   Electronically Signed   By: David  [place]   On: 08/15/2014 18:42     EKG Interpretation   Date/Time:  Monday August 15 2014 17:21:33 EDT Ventricular Rate:  69 PR Interval:    QRS Duration: 92 QT Interval:  457 QTC Calculation: 490 R Axis:   48 Text Interpretation:  Atrial fibrillation Borderline prolonged QT interval  Confirmed by Freida BusmanALLEN  MD, ANTHONY ([id number]) on 08/15/2014 7:29:19 PM      MDM   Final diagnoses:  Chronic diastolic CHF (congestive heart failure)  Dementia arising in the  senium and presenium  Encephalopathy  COPD exacerbation  Acute respiratory failure with hypoxia    Spoke with patient's husband he states that the patient just got released from the nursing home 3 days ago. The patient had been requesting to come back home. Husband states that the patient continues to take her Lasix 20 mg daily. However the home health nurse came and noticed the patient having increased swelling in her legs. The husband states that the patient stood up at her bed around 1:00 in the morning to go to the bathroom but then slowly sat down to the ground because of generalized weakness. She did not have syncope, and did not hit her head. Patient does not wear oxygen at home. She is maintaining adequate saturations on 2-3 L nasal cannula. However she drops her sats to the 80s on room air. BNP is 171. Chest x-ray demonstrates a left pleural effusion. Patient has 2+ bilateral lower extremity pitting edema. Findings consistent with fluid overload. Will give Lasix and admit for new hypoxia and congestive heart failure exacerbation.  Case discussed with Dr. Freida BusmanAllen.  Maris BergerJonah Kalee Broxton, MD 08/16/14 1018

## 2014-08-15 NOTE — ED Provider Notes (Signed)
I saw and evaluated the patient, reviewed the resident's note and I agree with the findings and plan.   EKG Interpretation   Date/Time:  Monday August 15 2014 17:21:33 EDT Ventricular Rate:  69 PR Interval:    QRS Duration: 92 QT Interval:  457 QTC Calculation: 490 R Axis:   48 Text Interpretation:  Atrial fibrillation Borderline prolonged QT interval  Confirmed by Freida BusmanALLEN  MD, Gentri Guardado ([id number]) on 08/15/2014 7:29:19 PM     Patient here with increased dyspnea as well as bilateral lower extremity edema. She has a new oxygen requirement. Chest x-ray consistent with heart failure. Will be admitted to the hospital  Toy BakerAnthony T Maki Sweetser, MD 08/15/14 [phone]

## 2014-08-15 NOTE — ED Notes (Signed)
Dr. Alvester MorinNewton, MD at bedside.

## 2014-08-15 NOTE — ED Notes (Signed)
Per EMS, pt was taken off lasix several weeks ago and has had increased leg swelling since.

## 2014-08-15 NOTE — H&P (Signed)
Hospitalist Admission History and Physical  Patient name: Paula [name] Medical record number: [id number] Date of birth: [birth year] Age: 80 y.o. Gender: female  Primary Care Provider: [name], MD  Chief Complaint: acute resp failure with hypoxia, fall, weakness, LE swelling  History of Present Illness:This is a 80 y.o. year old female with significant past medical history of end stage dementia, TIA, type 2 DM, chronic diastolic CHF, chronic asthma/COPD  presenting with acute resp failure with hypoxia, fall, weakness, LE swelling. Per the husband, pt was recently admitted to SNF over past 2-3 weeks. Was discharged home  3-4 days ago. Prior to SNF admission, pt was at admitted at behavioral health. Husband states that while there, physician had d/c'd one of her fluid medications. Since this point, pt has had worsening LE swelling. Husband states that he felt very uncomfotable about pt coming home. Since coming home, pt has had worsening SOB, LE swelling and weakness. Husband states that pt slid down the side of the bed yesterday, where she was unable to move for approx 5 hours. Husband states that he was waiting for her to get up, but she finally pulled herself up to the bed after 5 hour period. Sxs progressively worsened over the course of the day and husband called EMS. Husband also reports pt with intermittent complaints of numbness in LEs as well is increased cough, wheezing and albuterol use. No fever or chills at home.  On arrival to the ER, T 98.8, HR 50s-60s, Resp 10s-20s, BP 100s-20s, Satting >92% on 2L, though reported to desat into upper 80s on ambulation. WBC 11.4, Hgb 10.2, Cr 1.09, Glu 132. ProBNP 171(baseline). Urinalysis pending. CXR indicative of low grade CHF superimposed on COPD with small L pleural effusion. No evidence of PNA.   Assessment and Plan: Paula [name] is a 80 y.o. year old female presenting with acute resp failure w/ hypoxia, fall, weakness.   Active  Problems:   CHF (congestive heart failure)   Acute respiratory failure   1-Acute resp failure  -Likely multifactorial with contributions of CHF and COPD/asthma  -s/p 20mg  IV lasix in ER -foley placed, watch UOP overnight -strict Is and Os-daily weights -pro BNP at relative baseline  -2D ECHO  -IV solumedrol given wheezing (mild on exam today) -duonebs  -HCAP coverage given mild leukocytosis overnight-reassess in am  -VTE also on ddx given albeit somewhat lower -check d dimer-CTA + LE dopplers as clinically indicated   2-Fall/weakness/encephalopathy  -likely multifactorial in setting of above and baseline dementia  -check MRI brain -check vit D -check ammonia   3-DM  -SSI, A1C  4- Dementia  -end stage  -continue home regimen   5-HTN -BP stable  -cont home regimen -hold BB overnight given wheezing -reassess in am     FEN/GI: NPO  Prophylaxis: sub q heparin Disposition: pending further evaluation Code Status: Full Code    Patient Active Problem List   Diagnosis Date Noted  . CHF (congestive heart failure) 08/15/2014  . Acute respiratory failure 08/15/2014  . Sprain and strain of ribs 06/20/2014  . Nausea with vomiting 06/03/2014  . Other malaise and fatigue 05/02/2014  . Cough 04/25/2014  . B12 deficiency 04/25/2014  . Dyspnea 03/22/2014  . Chronic fatigue syndrome   . Chronic nausea   . TIA (transient ischemic attack) 11/06/2013  . Allergic rhinitis 10/12/2013  . Lumbago   . Dementia arising in the senium and presenium 03/08/2013  . Headache 02/19/2013  . Chronic diastolic CHF (congestive heart  failure) 02/11/2013  . Chronic asthmatic bronchitis with acute exacerbation 01/27/2013  . Hypertension   . Recurrent pneumonia   . Status post AAA (abdominal aortic aneurysm) repair   . Pre-syncope   . Depression 10/25/2011  . Anxiety 10/25/2011  . Facial spasm 10/13/2011  . Ejection fraction   . Contrast media allergy   . Hypothyroidism 09/19/2011  .  Sinus bradycardia 09/19/2011  . Parotid mass 09/18/2011  . COPD (chronic obstructive pulmonary disease) with emphysema 09/15/2011  . Type 2 diabetes, controlled, with peripheral circulatory disorder 09/15/2011   Past Medical History: Past Medical History  Diagnosis Date  . Depression   . Ejection fraction     EF 40%, echo, November, 2012  / in improved, EF 60%, echo, December, 2012  . Contrast media allergy     Patient feels poorly with contrast  . Status post AAA (abdominal aortic aneurysm) repair     Surgical repair, Dr. Hart [place], December 27, 2011  . Pre-syncope     vasovagal w/ heart block  . Parotid mass     has refused further eval 10/2011  . GERD (gastroesophageal reflux disease)   . Anxiety   . Facial tic     L sided spasms - Botox trial summer 2013, ?effective  . ARMD (age related macular degeneration) 06/2014    bilateral vision loss - legally blind  . AAA (abdominal aortic aneurysm)     s/p repair 12/2011  . Congestive heart failure     NL EF 10/2011 echo   . Diabetes mellitus type II, controlled   . Hypothyroidism   . Sinus bradycardia 09/19/2011    Occurring simultaneously with complete heart block   . Hypertension   . COPD (chronic obstructive pulmonary disease)   . Facial spasm     L hemifacial-treated w/ botox xeomin -Dr [place]ace [place]  . Dementia     MRI with progressive chronic microvascular ischemia and generalized cerebral atrophy  . Lumbosacral spondylosis without myelopathy   . Legally blind 06/2014    due to ARMD    Past Surgical History: Past Surgical History  Procedure Laterality Date  . Cholecystectomy    . Knee cartilage surgery      left  . Sinus surgery with instatrak    . Appendectomy    . Oophorectomy      ovarian cyst  . Cataract extraction      bilateral  . Bladder repair    . US echocardiography  10/14/11  . Abdominal aortic aneurysm repair  12/27/2011    ANEURYSM ABDOMINAL AORTIC REPAIR;  Surgeon: Josephina Gip, MD; Resection and Grafting  Abdominal Aortic Aneurysm , Aorta Bi Iliac.  Marland Kitchen Pacemaker insertion  11/12  . Cardiac catheterization  11/04/11    Social History: History   Social History  . Marital Status: Married    Spouse Name: [name]    Number of Children: 4  . Years of Education: 9th   Occupational History  . retired Other    worked for 25 years, hairdresser   Social History Main Topics  . Smoking status: Former Smoker -- 1.00 packs/day for 60 years    Types: Cigarettes    Quit date: 08/31/2011  . Smokeless tobacco: Never Used  . Alcohol Use: No  . Drug Use: No  . Sexual Activity: None   Other Topics Concern  . None   Social History Narrative   Patient is married Archivist) and lives at home with her husband.   Patient worked as a Interior and spatial designer  for 25 years.   Patient drinks a caffeine beverage daily.   Patient is right-handed.   Patient has four children, three are deceased and one child is living.   2 sisters live in [place]    Family History: Family History  Problem Relation Age of Onset  . Esophageal cancer Father   . Cancer Father   . Breast cancer Sister   . Cancer Sister   . Colon cancer Sister   . Heart disease Sister   . Heart disease Mother   . Heart disease Son   . Parkinson's disease Sister     younger    Allergies: Allergies  Allergen Reactions  . Bactrim [Sulfamethoxazole-Tmp Ds] Anaphylaxis  . Contrast Media [Iodinated Diagnostic Agents] Anaphylaxis and Swelling  . Iodine Anaphylaxis  . Iohexol Anaphylaxis, Shortness Of Breath and Swelling     Desc: PT STATES SHE HAD A SEVERE REACTION TO IV CONRAST WITH THROAT SWELLING AND SOB. SHE WAS ADMITTED TO THE HOSPITAL. SHE HAS NEVER HAD CONTRAST AGAIN.   Marland Kitchen Sulfa Antibiotics Anaphylaxis  . Aricept [Donepezil Hcl] Nausea And Vomiting  . Cymbalta [Duloxetine Hcl] Other (See Comments)    HALLUCINATIONS    . Delsym [Dextromethorphan] Other (See Comments)    Hallucination  . Guaifenesin & Derivatives Other (See Comments)     Hallucination  . Hydromet [Hydrocodone-Homatropine] Other (See Comments)    hallucinations  . Avelox [Moxifloxacin Hcl In Nacl] Rash  . Ciprofloxacin Itching  . Gadolinium Derivatives Other (See Comments)    Unknown    . Penicillins Rash    Rash as a child  . Quinolones Rash    Current Facility-Administered Medications  Medication Dose Route Frequency Provider Last Rate Last Dose  . 0.9 %  sodium chloride infusion  250 mL Intravenous PRN Doree Albee, MD      . aspirin tablet 325 mg  325 mg Oral Daily Doree Albee, MD      . Melene Muller ON 08/16/2014] divalproex (DEPAKOTE) DR tablet 125 mg  125 mg Oral TID WC Doree Albee, MD      . gabapentin (NEURONTIN) capsule 200 mg  200 mg Oral TID Doree Albee, MD      . heparin injection 5,000 Units  5,000 Units Subcutaneous 3 times per day Doree Albee, MD      . Melene Muller ON 08/16/2014] levothyroxine (SYNTHROID, LEVOTHROID) tablet 87.5-175 mcg  87.5-175 mcg Oral QAC breakfast Doree Albee, MD      . losartan-hydrochlorothiazide [facility]) 50-12.5 MG per tablet 1 tablet  1 tablet Oral Daily Doree Albee, MD      . OLANZapine [facility]) tablet 5 mg  5 mg Oral BID Doree Albee, MD      . pantoprazole (PROTONIX) EC tablet 40 mg  40 mg Oral QODAY Doree Albee, MD      . Polyethyl Glycol-Propyl Glycol 0.4-0.3 % SOLN 1 drop  1 drop Ophthalmic BID PRN Doree Albee, MD      . potassium chloride SA (K-DUR,KLOR-CON) CR tablet 20 mEq  20 mEq Oral Daily Doree Albee, MD      . Melene Muller ON 08/16/2014] rivastigmine (EXELON) 4.6 mg/24hr 4.6 mg  4.6 mg Transdermal q morning - 10a Doree Albee, MD      . sertraline (ZOLOFT) tablet 50 mg  50 mg Oral Daily Doree Albee, MD      . sodium chloride 0.9 % injection 3 mL  3 mL Intravenous Q12H Doree Albee, MD      . sodium chloride 0.9 % injection 3  mL  3 mL Intravenous Q12H Doree Albee, MD      . sodium chloride 0.9 % injection 3 mL  3 mL Intravenous PRN Doree Albee, MD      . traZODone (DESYREL) tablet 150 mg   150 mg Oral QHS Doree Albee, MD       Current Outpatient Prescriptions  Medication Sig Dispense Refill  . acetaminophen (TYLENOL) 500 MG tablet Take 500 mg by mouth every 6 (six) hours as needed (pain).      Marland Kitchen albuterol (PROVENTIL HFA;VENTOLIN HFA) 108 (90 BASE) MCG/ACT inhaler Inhale 2 puffs into the lungs every 6 (six) hours as needed for wheezing or shortness of breath.  3 Inhaler  0  . ALPRAZolam (XANAX) 0.5 MG tablet Take 1 tablet (0.5 mg total) by mouth 3 (three) times daily.  90 tablet  5  . aspirin 325 MG tablet Take 325 mg by mouth daily.      . baclofen (LIORESAL) 10 MG tablet Take 1 tablet (10 mg total) by mouth 4 (four) times daily as needed for muscle spasms.  120 each  6  . benzonatate (TESSALON) 100 MG capsule Take 2 capsules (200 mg total) by mouth at bedtime as needed for cough.  30 capsule  1  . Calcium Carbonate-Vitamin D (CALCARB 600/D) 600-400 MG-UNIT per tablet Take 1 tablet by mouth every other day.       . carvedilol (COREG) 6.25 MG tablet Take 6.25 mg by mouth 2 (two) times daily with a meal.      . Cholecalciferol (VITAMIN D3) 5000 UNITS TABS Take 5,000 Units by mouth every other day.       . cyanocobalamin 500 MCG tablet Take 500 mcg by mouth daily.      . divalproex (DEPAKOTE) 125 MG DR tablet Take 125 mg by mouth 3 (three) times daily with meals.      . fexofenadine (ALLEGRA) 180 MG tablet Take 1 tablet (180 mg total) by mouth daily.  30 tablet  3  . fluticasone (FLONASE) 50 MCG/ACT nasal spray Place 1 spray into both nostrils daily.  16 g  3  . furosemide (LASIX) 20 MG tablet Take 20 mg by mouth every morning.       . gabapentin (NEURONTIN) 100 MG capsule Take 200 mg by mouth 3 (three) times daily.       Marland Kitchen levothyroxine (SYNTHROID, LEVOTHROID) 175 MCG tablet Take 87.5-175 mcg by mouth daily before breakfast. Take 1 tab ( ) on Sunday, Monday, Wednesday, Friday, and Saturday. Take 0.5 tab (87.32mcg) on Tuesday and Thursday.      . losartan-hydrochlorothiazide  (HYZAAR) 50-12.5 MG per tablet Take 1 tablet by mouth daily.  90 tablet  1  . metFORMIN (GLUCOPHAGE-XR) 500 MG 24 hr tablet Take 250 mg by mouth at bedtime.      Marland Kitchen OLANZapine (ZYPREXA) 5 MG tablet Take 5 mg by mouth 2 (two) times daily.      . Omega-3 Fatty Acids (FISH OIL) 1000 MG CAPS Take 1,000 mg by mouth daily.      . pantoprazole (PROTONIX) 40 MG tablet Take 40 mg by mouth every other day.       Bertram Gala Glycol-Propyl Glycol (SYSTANE) 0.4-0.3 % SOLN Apply 1 drop to eye 2 (two) times daily as needed (dry eyes).      . potassium chloride SA (K-DUR,KLOR-CON) 20 MEQ tablet Take 20 mEq by mouth daily.      . rivastigmine (EXELON) 4.6 mg/24hr Place 4.6 mg onto the skin  every morning.      . sertraline (ZOLOFT) 50 MG tablet Take 50 mg by mouth daily.      . traZODone (DESYREL) 150 MG tablet Take 150 mg by mouth at bedtime.       Review Of Systems: 12 point ROS negative except as noted above in HPI.  Physical Exam: Filed Vitals:   08/15/14 2045  BP: 102/42  Pulse: 63  Temp:   Resp:     General: somnolent, minimally-mildly responsive  HEENT: PERRLA and extra ocular movement intact Heart: S1, S2 normal, no murmur, rub or gallop, regular rate and rhythm Lungs: faint wheezes in bases  Abdomen: abdomen is soft without significant tenderness, masses, organomegaly or guarding Extremities: 2+ peripheral pulses, 2+ edema bilaterally  Skin:no rashes Neurology: somnolent, mildly responsive to sternal rubbing, uncooperative to exam   Labs and Imaging: Lab Results  Component Value Date/Time   NA 145 08/15/2014  5:35 PM   K 4.7 08/15/2014  5:35 PM   CL 105 08/15/2014  5:35 PM   CO2 29 08/15/2014  5:35 PM   BUN 21 08/15/2014  5:35 PM   CREATININE 1.09 08/15/2014  5:35 PM   CREATININE 0.80 12/23/2011  8:24 AM   GLUCOSE 132* 08/15/2014  5:35 PM   Lab Results  Component Value Date   WBC 11.4* 08/15/2014   HGB 10.2* 08/15/2014   HCT 32.9* 08/15/2014   MCV 107.5* 08/15/2014   PLT 268 08/15/2014     Dg Chest 2 View  08/15/2014   CLINICAL DATA:  Status post fall last night ; mental status change; history of COPD, CHF, diabetes, and leg swelling  EXAM: CHEST  2 VIEW  COMPARISON:  Portable chest x-ray of July 09, 2014  FINDINGS: The lungs remain mildly hyperinflated. The interstitial markings are mildly increased overall. A small amount of pleural fluid blunts the lateral and posterior costophrenic angles on the left. The cardiac silhouette is enlarged. The pulmonary vascularity is mildly engorged. The bony thorax is unremarkable. The observed portions of the bony thorax exhibit no acute abnormalities.  IMPRESSION: Low-grade CHF superimposed upon COPD. There is a small left pleural effusion. There is no evidence of pneumonia.   Electronically Signed   By: David  [email or web address]   On: 08/15/2014 18:42           Doree AlbeeSteven Marabelle Cushman MD  Pager: [phone]

## 2014-08-16 DIAGNOSIS — I5032 Chronic diastolic (congestive) heart failure: Secondary | ICD-10-CM

## 2014-08-16 DIAGNOSIS — F068 Other specified mental disorders due to known physiological condition: Secondary | ICD-10-CM

## 2014-08-16 DIAGNOSIS — F29 Unspecified psychosis not due to a substance or known physiological condition: Secondary | ICD-10-CM

## 2014-08-16 DIAGNOSIS — J441 Chronic obstructive pulmonary disease with (acute) exacerbation: Secondary | ICD-10-CM

## 2014-08-16 DIAGNOSIS — J9691 Respiratory failure, unspecified with hypoxia: Secondary | ICD-10-CM

## 2014-08-16 DIAGNOSIS — R609 Edema, unspecified: Secondary | ICD-10-CM

## 2014-08-16 DIAGNOSIS — I517 Cardiomegaly: Secondary | ICD-10-CM

## 2014-08-16 DIAGNOSIS — G9340 Encephalopathy, unspecified: Secondary | ICD-10-CM

## 2014-08-16 DIAGNOSIS — F0390 Unspecified dementia without behavioral disturbance: Secondary | ICD-10-CM

## 2014-08-16 LAB — CBC WITH DIFFERENTIAL/PLATELET
Basophils Absolute: 0 10*3/uL (ref 0.0–0.1)
Basophils Relative: 0 % (ref 0–1)
EOS ABS: 0 10*3/uL (ref 0.0–0.7)
Eosinophils Relative: 0 % (ref 0–5)
HCT: 31.8 % — ABNORMAL LOW (ref 36.0–46.0)
HEMOGLOBIN: 10 g/dL — AB (ref 12.0–15.0)
LYMPHS ABS: 0.8 10*3/uL (ref 0.7–4.0)
LYMPHS PCT: 11 % — AB (ref 12–46)
MCH: 32.8 pg (ref 26.0–34.0)
MCHC: 31.4 g/dL (ref 30.0–36.0)
MCV: 104.3 fL — ABNORMAL HIGH (ref 78.0–100.0)
Monocytes Absolute: 0.1 10*3/uL (ref 0.1–1.0)
Monocytes Relative: 2 % — ABNORMAL LOW (ref 3–12)
NEUTROS PCT: 87 % — AB (ref 43–77)
Neutro Abs: 6.5 10*3/uL (ref 1.7–7.7)
Platelets: 241 10*3/uL (ref 150–400)
RBC: 3.05 MIL/uL — AB (ref 3.87–5.11)
RDW: 17.1 % — ABNORMAL HIGH (ref 11.5–15.5)
WBC: 7.5 10*3/uL (ref 4.0–10.5)

## 2014-08-16 LAB — COMPREHENSIVE METABOLIC PANEL
ALT: 11 U/L (ref 0–35)
AST: 23 U/L (ref 0–37)
Albumin: 3.2 g/dL — ABNORMAL LOW (ref 3.5–5.2)
Alkaline Phosphatase: 47 U/L (ref 39–117)
Anion gap: 13 (ref 5–15)
BUN: 22 mg/dL (ref 6–23)
CALCIUM: 8.5 mg/dL (ref 8.4–10.5)
CO2: 27 mEq/L (ref 19–32)
Chloride: 103 mEq/L (ref 96–112)
Creatinine, Ser: 0.95 mg/dL (ref 0.50–1.10)
GFR calc non Af Amer: 53 mL/min — ABNORMAL LOW (ref >90)
GFR, EST AFRICAN AMERICAN: 62 mL/min — AB (ref >90)
Glucose, Bld: 192 mg/dL — ABNORMAL HIGH (ref 70–99)
Potassium: 4.2 mEq/L (ref 3.7–5.3)
SODIUM: 143 meq/L (ref 137–147)
TOTAL PROTEIN: 6 g/dL (ref 6.0–8.3)
Total Bilirubin: 0.4 mg/dL (ref 0.3–1.2)

## 2014-08-16 LAB — HIV ANTIBODY (ROUTINE TESTING W REFLEX): HIV: NONREACTIVE

## 2014-08-16 LAB — HEMOGLOBIN A1C
HEMOGLOBIN A1C: 6.1 % — AB (ref <5.7)
Mean Plasma Glucose: 128 mg/dL — ABNORMAL HIGH (ref <117)

## 2014-08-16 LAB — LEGIONELLA ANTIGEN, URINE

## 2014-08-16 LAB — URINALYSIS, ROUTINE W REFLEX MICROSCOPIC
Bilirubin Urine: NEGATIVE
GLUCOSE, UA: NEGATIVE mg/dL
Hgb urine dipstick: NEGATIVE
Ketones, ur: NEGATIVE mg/dL
LEUKOCYTES UA: NEGATIVE
Nitrite: NEGATIVE
PH: 5.5 (ref 5.0–8.0)
Protein, ur: NEGATIVE mg/dL
SPECIFIC GRAVITY, URINE: 1.016 (ref 1.005–1.030)
Urobilinogen, UA: 0.2 mg/dL (ref 0.0–1.0)

## 2014-08-16 LAB — TROPONIN I
Troponin I: <0.3 ng/mL (ref <0.30)
Troponin I: <0.3 ng/mL (ref <0.30)

## 2014-08-16 LAB — GLUCOSE, CAPILLARY
GLUCOSE-CAPILLARY: 205 mg/dL — AB (ref 70–99)
Glucose-Capillary: 111 mg/dL — ABNORMAL HIGH (ref 70–99)
Glucose-Capillary: 115 mg/dL — ABNORMAL HIGH (ref 70–99)
Glucose-Capillary: 175 mg/dL — ABNORMAL HIGH (ref 70–99)

## 2014-08-16 LAB — TSH: TSH: 16.41 u[IU]/mL — AB (ref 0.350–4.500)

## 2014-08-16 LAB — STREP PNEUMONIAE URINARY ANTIGEN: STREP PNEUMO URINARY ANTIGEN: NEGATIVE

## 2014-08-16 LAB — VITAMIN D 25 HYDROXY (VIT D DEFICIENCY, FRACTURES): Vit D, 25-Hydroxy: 42 ng/mL (ref 30–89)

## 2014-08-16 LAB — CK TOTAL AND CKMB (NOT AT ARMC)
CK, MB: 2.8 ng/mL (ref 0.3–4.0)
Relative Index: INVALID (ref 0.0–2.5)
Total CK: 90 U/L (ref 7–177)

## 2014-08-16 LAB — AMMONIA: AMMONIA: 31 umol/L (ref 11–60)

## 2014-08-16 LAB — D-DIMER, QUANTITATIVE: D-Dimer, Quant: 2.72 ug/mL-FEU — ABNORMAL HIGH (ref 0.00–0.48)

## 2014-08-16 MED ORDER — HALOPERIDOL LACTATE 5 MG/ML IJ SOLN
2.0000 mg | Freq: Four times a day (QID) | INTRAMUSCULAR | Status: DC | PRN
  Administered 2014-08-16: 2 mg via INTRAVENOUS
  Filled 2014-08-16: qty 1

## 2014-08-16 MED ORDER — PREDNISONE 20 MG PO TABS
40.0000 mg | ORAL_TABLET | Freq: Every day | ORAL | Status: DC
  Administered 2014-08-16 – 2014-08-18 (×3): 40 mg via ORAL
  Filled 2014-08-16 (×4): qty 2

## 2014-08-16 MED ORDER — PREDNISOLONE 5 MG PO TABS: 40.0000 mg | ORAL_TABLET | Freq: Every day | ORAL | Status: DC

## 2014-08-16 MED ORDER — ENOXAPARIN SODIUM 80 MG/0.8ML ~~LOC~~ SOLN
80.0000 mg | Freq: Two times a day (BID) | SUBCUTANEOUS | Status: DC
  Filled 2014-08-16 (×2): qty 0.8

## 2014-08-16 MED ORDER — ALPRAZOLAM 0.5 MG PO TABS
0.5000 mg | ORAL_TABLET | Freq: Three times a day (TID) | ORAL | Status: DC
  Administered 2014-08-16 – 2014-08-17 (×4): 0.5 mg via ORAL
  Filled 2014-08-16 (×7): qty 1

## 2014-08-16 MED ORDER — ENOXAPARIN SODIUM 60 MG/0.6ML ~~LOC~~ SOLN
60.0000 mg | Freq: Once | SUBCUTANEOUS | Status: DC
Start: 2014-08-16 — End: 2014-08-16
  Filled 2014-08-16: qty 0.6

## 2014-08-16 MED ORDER — ACETAMINOPHEN 500 MG PO TABS
500.0000 mg | ORAL_TABLET | Freq: Four times a day (QID) | ORAL | Status: DC | PRN
  Administered 2014-08-17 – 2014-08-18 (×2): 500 mg via ORAL
  Filled 2014-08-16 (×3): qty 1

## 2014-08-16 MED ORDER — DOXYCYCLINE HYCLATE 100 MG PO TABS
100.0000 mg | ORAL_TABLET | Freq: Two times a day (BID) | ORAL | Status: DC
  Administered 2014-08-16 – 2014-08-18 (×5): 100 mg via ORAL
  Filled 2014-08-16 (×6): qty 1

## 2014-08-16 MED ORDER — CARVEDILOL 6.25 MG PO TABS
6.2500 mg | ORAL_TABLET | Freq: Two times a day (BID) | ORAL | Status: DC
  Administered 2014-08-16 – 2014-08-18 (×4): 6.25 mg via ORAL
  Filled 2014-08-16 (×7): qty 1

## 2014-08-16 MED ORDER — ENOXAPARIN SODIUM 40 MG/0.4ML ~~LOC~~ SOLN
40.0000 mg | SUBCUTANEOUS | Status: DC
  Administered 2014-08-17 – 2014-08-18 (×2): 40 mg via SUBCUTANEOUS
  Filled 2014-08-16 (×2): qty 0.4

## 2014-08-16 MED ORDER — IPRATROPIUM-ALBUTEROL 0.5-2.5 (3) MG/3ML IN SOLN
3.0000 mL | Freq: Three times a day (TID) | RESPIRATORY_TRACT | Status: DC
  Administered 2014-08-16 – 2014-08-18 (×6): 3 mL via RESPIRATORY_TRACT
  Filled 2014-08-16 (×8): qty 3

## 2014-08-16 MED ORDER — ENOXAPARIN SODIUM 80 MG/0.8ML ~~LOC~~ SOLN
80.0000 mg | Freq: Two times a day (BID) | SUBCUTANEOUS | Status: DC
  Administered 2014-08-16: 80 mg via SUBCUTANEOUS
  Filled 2014-08-16 (×2): qty 0.8

## 2014-08-16 MED ORDER — ALBUTEROL SULFATE (2.5 MG/3ML) 0.083% IN NEBU: 3.0000 mL | INHALATION_SOLUTION | Freq: Four times a day (QID) | RESPIRATORY_TRACT | Status: DC | PRN

## 2014-08-16 NOTE — Progress Notes (Addendum)
*  Preliminary Results* Bilateral lower extremity venous duplex completed. Bilateral lower extremities are negative for deep vein thrombosis. There is evidence of right Baker's cyst, no evidence of left Baker's cyst.  08/16/2014  Gertie FeyMichelle Mathieu Schloemer, RVT, RDCS, RDMS

## 2014-08-16 NOTE — Progress Notes (Signed)
RN at pts. Bedside to assess pt. Pt. Being combative at this time and removing telemetry leads PRN haldol administered. RN will attempt to reassess pt. Shortly. And continue to monitor pt.

## 2014-08-16 NOTE — Progress Notes (Signed)
ANTICOAGULATION CONSULT NOTE - Initial Consult  Pharmacy Consult for Lovenox Indication: r/o PE/DVT  Allergies  Allergen Reactions  . Bactrim [Sulfamethoxazole-Tmp Ds] Anaphylaxis  . Contrast Media [Iodinated Diagnostic Agents] Anaphylaxis and Swelling  . Iodine Anaphylaxis  . Iohexol Anaphylaxis, Shortness Of Breath and Swelling     Desc: PT STATES SHE HAD A SEVERE REACTION TO IV CONRAST WITH THROAT SWELLING AND SOB. SHE WAS ADMITTED TO THE HOSPITAL. SHE HAS NEVER HAD CONTRAST AGAIN.   Marland Kitchen. Sulfa Antibiotics Anaphylaxis  . Aricept [Donepezil Hcl] Nausea And Vomiting  . Cymbalta [Duloxetine Hcl] Other (See Comments)    HALLUCINATIONS    . Delsym [Dextromethorphan] Other (See Comments)    Hallucination  . Guaifenesin & Derivatives Other (See Comments)    Hallucination  . Hydromet [Hydrocodone-Homatropine] Other (See Comments)    hallucinations  . Avelox [Moxifloxacin Hcl In Nacl] Rash  . Ciprofloxacin Itching  . Gadolinium Derivatives Other (See Comments)    Unknown    . Penicillins Rash    Rash as a child  . Quinolones Rash    Patient Measurements: Height: 5\' 5"  (165.1 cm) Weight: 182 lb 1.6 oz (82.6 kg) IBW/kg (Calculated) : 57  Vital Signs: Temp: 97.8 F (36.6 C) (10/05 2145) Temp Source: Oral (10/05 2145) BP: 127/48 mmHg (10/05 2145) Pulse Rate: 63 (10/05 2145)  Labs:  Recent Labs  08/15/14 1735 08/15/14 2324  HGB 10.2*  --   HCT 32.9*  --   PLT 268  --   CREATININE 1.09  --   CKTOTAL  --  90  CKMB  --  2.8  TROPONINI  --  <0.30    Estimated Creatinine Clearance: 40 ml/min (by C-G formula based on Cr of 1.09).  Assessment: 80 yo female with SOB/LE swelling, elevated D-Dimer and possible PE/DVT, for Lovenox.  Heparin 5000 units SQ given at 0100  Goal of Therapy:  Full anticoagulation with Lovenox Monitor platelets by anticoagulation protocol: Yes   Plan:  Due to recent SQ heparin, will give Lovenox 60 mg SQ now, then start Lovenox 80 mg SQ q12h  later this morning F/U Dopplers and VQ scan results  Kendyn Zaman, Gary FleetGregory Vernon 08/16/2014,1:53 AM

## 2014-08-16 NOTE — Progress Notes (Signed)
Pt refusing medicines and insulin this evening.  MD notified, will continue to monitor.

## 2014-08-16 NOTE — Consult Note (Signed)
[facility] Face-to-Face Psychiatry Consult   Reason for Consult:  Capacity evaluation Referring Physician:  Charlynne Cousins, MD  Paula [name] is an 80 y.o. female. Total Time spent with patient: 30 minutes  Assessment: AXIS I:  dementia, severe with psychosis AXIS II:  Deferred AXIS III:   Past Medical History  Diagnosis Date  . Depression   . Ejection fraction     EF 40%, echo, November, 2012  / in improved, EF 60%, echo, December, 2012  . Contrast media allergy     Patient feels poorly with contrast  . Status post AAA (abdominal aortic aneurysm) repair     Surgical repair, Dr. Kellie Simmering, December 27, 2011  . Pre-syncope     vasovagal w/ heart block  . Parotid mass     has refused further eval 10/2011  . GERD (gastroesophageal reflux disease)   . Anxiety   . Facial tic     L sided spasms - Botox trial summer 2013, ?effective  . ARMD (age related macular degeneration) 06/2014    bilateral vision loss - legally blind  . AAA (abdominal aortic aneurysm)     s/p repair 12/2011  . Congestive heart failure     NL EF 10/2011 echo   . Diabetes mellitus type II, controlled   . Hypothyroidism   . Sinus bradycardia 09/19/2011    Occurring simultaneously with complete heart block   . Hypertension   . COPD (chronic obstructive pulmonary disease)   . Facial spasm     L hemifacial-treated w/ botox xeomin -Dr Krista Blue  . Dementia     MRI with progressive chronic microvascular ischemia and generalized cerebral atrophy  . Lumbosacral spondylosis without myelopathy   . Legally blind 06/2014    due to ARMD   AXIS IV:  other psychosocial or environmental problems, problems related to social environment and problems with primary support group AXIS V:  21-30 behavior considerably influenced by delusions or hallucinations OR serious impairment in judgment, communication OR inability to function in almost all areas  Plan: Case discussed with Charlynne Cousins, MD Patient does not meet criteria of  capacity to make her own medical decision and living arragements and family should obtain MCPOA if not available Please refer to psych social service if needs further information about MCPOA No evidence of imminent risk to self or others at present.   Patient does not meet criteria for psychiatric inpatient admission. Supportive therapy provided about ongoing stressors. Discussed crisis plan, support from social network, calling 911, coming to the Emergency Department, and calling Suicide Hotline.  Subjective:   Paula [name] is a 80 y.o. female patient admitted with fall, weakness and SOB.  HPI:  Patient is seen for face to face psych consultation regarding capacity evaluation. Patient brother in law is at bed side. Patient is not able to tell me her medical problems or emotional problems and does not even know that she is in hospital. Patient has severe memory deficits, paranoia, poor insight, judgment and has no capacity to make her own medical decisions or living arrangements. She is not able to orient to place, persons, time or situation. She know her name and year she was born, and identify her brother in law but not able to say his name right but no other information. She is legally blind and has paranoid thoughts about her husband, saying he is making up her problems and he does not want her to do well.   Medical history: This is  a 80 y.o. year old female with significant past medical history of end stage dementia, TIA, type 2 DM, chronic diastolic CHF, chronic asthma/COPD presenting with acute resp failure with hypoxia, fall, weakness, LE swelling. Per the husband, pt was recently admitted to SNF over past 2-3 weeks. Was discharged home 3-4 days ago. Prior to SNF admission, pt was at admitted at behavioral health. Husband states that while there, physician had d/c'd one of her fluid medications. Since this point, pt has had worsening LE swelling. Husband states that he felt very uncomfotable  about pt coming home. Since coming home, pt has had worsening SOB, LE swelling and weakness. Husband states that pt slid down the side of the bed yesterday, where she was unable to move for approx 5 hours. Husband states that he was waiting for her to get up, but she finally pulled herself up to the bed after 5 hour period. Sxs progressively worsened over the course of the day and husband called EMS. Husband also reports pt with intermittent complaints of numbness in LEs as well is increased cough, wheezing and albuterol use. No fever or chills at home.  On arrival to the ER, T 98.8, HR 50s-60s, Resp 10s-20s, BP 100s-20s, Satting >92% on 2L, though reported to desat into upper 80s on ambulation. WBC 11.4, Hgb 10.2, Cr 1.09, Glu 132. ProBNP 171(baseline). Urinalysis pending. CXR indicative of low grade CHF superimposed on COPD with small L pleural effusion. No evidence of PNA  HPI Elements:   Location:  dementia with paranoid thoughts. Quality:  unable to care for herself. Severity:  chronic and severe. Timing:  unknown.  Past Psychiatric History: Past Medical History  Diagnosis Date  . Depression   . Ejection fraction     EF 40%, echo, November, 2012  / in improved, EF 60%, echo, December, 2012  . Contrast media allergy     Patient feels poorly with contrast  . Status post AAA (abdominal aortic aneurysm) repair     Surgical repair, Dr. Kellie Simmering, December 27, 2011  . Pre-syncope     vasovagal w/ heart block  . Parotid mass     has refused further eval 10/2011  . GERD (gastroesophageal reflux disease)   . Anxiety   . Facial tic     L sided spasms - Botox trial summer 2013, ?effective  . ARMD (age related macular degeneration) 06/2014    bilateral vision loss - legally blind  . AAA (abdominal aortic aneurysm)     s/p repair 12/2011  . Congestive heart failure     NL EF 10/2011 echo   . Diabetes mellitus type II, controlled   . Hypothyroidism   . Sinus bradycardia 09/19/2011    Occurring  simultaneously with complete heart block   . Hypertension   . COPD (chronic obstructive pulmonary disease)   . Facial spasm     L hemifacial-treated w/ botox xeomin -Dr Krista Blue  . Dementia     MRI with progressive chronic microvascular ischemia and generalized cerebral atrophy  . Lumbosacral spondylosis without myelopathy   . Legally blind 06/2014    due to ARMD    reports that she quit smoking about 2 years ago. Her smoking use included Cigarettes. She has a 60 pack-year smoking history. She has never used smokeless tobacco. She reports that she does not drink alcohol or use illicit drugs. Family History  Problem Relation Age of Onset  . Esophageal cancer Father   . Cancer Father   . Breast cancer  Sister   . Cancer Sister   . Colon cancer Sister   . Heart disease Sister   . Heart disease Mother   . Heart disease Son   . Parkinson's disease Sister     younger           Allergies:   Allergies  Allergen Reactions  . Bactrim [Sulfamethoxazole-Tmp Ds] Anaphylaxis  . Contrast Media [Iodinated Diagnostic Agents] Anaphylaxis and Swelling  . Iodine Anaphylaxis  . Iohexol Anaphylaxis, Shortness Of Breath and Swelling     Desc: PT STATES SHE HAD A SEVERE REACTION TO IV CONRAST WITH THROAT SWELLING AND SOB. SHE WAS ADMITTED TO THE HOSPITAL. SHE HAS NEVER HAD CONTRAST AGAIN.   Marland Kitchen Sulfa Antibiotics Anaphylaxis  . Aricept [Donepezil Hcl] Nausea And Vomiting  . Cymbalta [Duloxetine Hcl] Other (See Comments)    HALLUCINATIONS    . Delsym [Dextromethorphan] Other (See Comments)    Hallucination  . Guaifenesin & Derivatives Other (See Comments)    Hallucination  . Hydromet [Hydrocodone-Homatropine] Other (See Comments)    hallucinations  . Avelox [Moxifloxacin Hcl In Nacl] Rash  . Ciprofloxacin Itching  . Gadolinium Derivatives Other (See Comments)    Unknown    . Penicillins Rash    Rash as a child  . Quinolones Rash    ACT Assessment Complete:  NO Objective: Blood pressure  120/49, pulse 60, temperature 97.9 F (36.6 C), temperature source Oral, resp. rate 20, height _0  (1.651 m), weight 82.6 kg (182 lb 1.6 oz), SpO2 94.00%.Body mass index is 30.3 kg/(m^2). Results for orders placed during the hospital encounter of 08/15/14 (from the past 72 hour(s))  CBC WITH DIFFERENTIAL     Status: Abnormal   Collection Time    08/15/14  5:35 PM      Result Value Ref Range   WBC 11.4 (*) 4.0 - 10.5 K/uL   RBC 3.06 (*) 3.87 - 5.11 MIL/uL   Hemoglobin 10.2 (*) 12.0 - 15.0 g/dL   HCT 32.9 (*) 36.0 - 46.0 %   MCV 107.5 (*) 78.0 - 100.0 fL   MCH 33.3  26.0 - 34.0 pg   MCHC 31.0  30.0 - 36.0 g/dL   RDW 17.1 (*) 11.5 - 15.5 %   Platelets 268  150 - 400 K/uL   Neutrophils Relative % 58  43 - 77 %   Lymphocytes Relative 25  12 - 46 %   Monocytes Relative 11  3 - 12 %   Eosinophils Relative 6 (*) 0 - 5 %   Basophils Relative 0  0 - 1 %   Neutro Abs 6.5  1.7 - 7.7 K/uL   Lymphs Abs 2.9  0.7 - 4.0 K/uL   Monocytes Absolute 1.3 (*) 0.1 - 1.0 K/uL   Eosinophils Absolute 0.7  0.0 - 0.7 K/uL   Basophils Absolute 0.0  0.0 - 0.1 K/uL   Smear Review MORPHOLOGY UNREMARKABLE    COMPREHENSIVE METABOLIC PANEL     Status: Abnormal   Collection Time    08/15/14  5:35 PM      Result Value Ref Range   Sodium 145  137 - 147 mEq/L   Potassium 4.7  3.7 - 5.3 mEq/L   Comment: HEMOLYSIS AT THIS LEVEL MAY AFFECT RESULT   Chloride 105  96 - 112 mEq/L   CO2 29  19 - 32 mEq/L   Glucose, Bld 132 (*) 70 - 99 mg/dL   BUN 21  6 - 23 mg/dL   Creatinine, Ser  1.09  0.50 - 1.10 mg/dL   Calcium 8.6  8.4 - 10.5 mg/dL   Total Protein 6.4  6.0 - 8.3 g/dL   Albumin 3.3 (*) 3.5 - 5.2 g/dL   AST 27  0 - 37 U/L   Comment: HEMOLYSIS AT THIS LEVEL MAY AFFECT RESULT   ALT 12  0 - 35 U/L   Comment: HEMOLYSIS AT THIS LEVEL MAY AFFECT RESULT   Alkaline Phosphatase 64  39 - 117 U/L   Total Bilirubin 0.3  0.3 - 1.2 mg/dL   GFR calc non Af Amer 45 (*) >90 mL/min   GFR calc Af Amer 52 (*) >90 mL/min    Comment: (NOTE)     The eGFR has been calculated using the CKD EPI equation.     This calculation has not been validated in all clinical situations.     eGFR's persistently <90 mL/min signify possible Chronic Kidney     Disease.   Anion gap 11  5 - 15  PRO B NATRIURETIC PEPTIDE     Status: None   Collection Time    08/15/14  5:35 PM      Result Value Ref Range   Pro B Natriuretic peptide (BNP) 171.4  0 - 450 pg/mL  CBG MONITORING, ED     Status: Abnormal   Collection Time    08/15/14  7:38 PM      Result Value Ref Range   Glucose-Capillary 115 (*) 70 - 99 mg/dL  D-DIMER, QUANTITATIVE     Status: Abnormal   Collection Time    08/15/14 11:24 PM      Result Value Ref Range   D-Dimer, Quant 2.72 (*) 0.00 - 0.48 ug/mL-FEU   Comment:            AT THE INHOUSE ESTABLISHED CUTOFF     VALUE OF 0.48 ug/mL FEU,     THIS ASSAY HAS BEEN DOCUMENTED     IN THE LITERATURE TO HAVE     A SENSITIVITY AND NEGATIVE     PREDICTIVE VALUE OF AT LEAST     98 TO 99%.  THE TEST RESULT     SHOULD BE CORRELATED WITH     AN ASSESSMENT OF THE CLINICAL     PROBABILITY OF DVT / VTE.  TROPONIN I     Status: None   Collection Time    08/15/14 11:24 PM      Result Value Ref Range   Troponin I <0.30  <0.30 ng/mL   Comment:            Due to the release kinetics of cTnI,     a negative result within the first hours     of the onset of symptoms does not rule out     myocardial infarction with certainty.     If myocardial infarction is still suspected,     repeat the test at appropriate intervals.  TSH     Status: Abnormal   Collection Time    08/15/14 11:24 PM      Result Value Ref Range   TSH 16.410 (*) 0.350 - 4.500 uIU/mL  CK TOTAL AND CKMB     Status: None   Collection Time    08/15/14 11:24 PM      Result Value Ref Range   Total CK 90  7 - 177 U/L   CK, MB 2.8  0.3 - 4.0 ng/mL   Relative Index RELATIVE INDEX IS INVALID  0.0 - 2.5  Comment: WHEN CK < 100 U/L             AMMONIA     Status:  None   Collection Time    08/15/14 11:24 PM      Result Value Ref Range   Ammonia 31  11 - 60 umol/L  GLUCOSE, CAPILLARY     Status: Abnormal   Collection Time    08/16/14 12:44 AM      Result Value Ref Range   Glucose-Capillary 115 (*) 70 - 99 mg/dL   Comment 1 Documented in Chart     Comment 2 Notify RN    GLUCOSE, CAPILLARY     Status: Abnormal   Collection Time    08/16/14  1:51 AM      Result Value Ref Range   Glucose-Capillary 111 (*) 70 - 99 mg/dL  GLUCOSE, CAPILLARY     Status: Abnormal   Collection Time    08/16/14  4:50 AM      Result Value Ref Range   Glucose-Capillary 175 (*) 70 - 99 mg/dL   Comment 1 Documented in Chart     Comment 2 Notify RN    URINALYSIS, ROUTINE W REFLEX MICROSCOPIC     Status: None   Collection Time    08/16/14  5:15 AM      Result Value Ref Range   Color, Urine YELLOW  YELLOW   APPearance CLEAR  CLEAR   Specific Gravity, Urine 1.016  1.005 - 1.030   pH 5.5  5.0 - 8.0   Glucose, UA NEGATIVE  NEGATIVE mg/dL   Hgb urine dipstick NEGATIVE  NEGATIVE   Bilirubin Urine NEGATIVE  NEGATIVE   Ketones, ur NEGATIVE  NEGATIVE mg/dL   Protein, ur NEGATIVE  NEGATIVE mg/dL   Urobilinogen, UA 0.2  0.0 - 1.0 mg/dL   Nitrite NEGATIVE  NEGATIVE   Leukocytes, UA NEGATIVE  NEGATIVE   Comment: MICROSCOPIC NOT DONE ON URINES WITH NEGATIVE PROTEIN, BLOOD, LEUKOCYTES, NITRITE, OR GLUCOSE <1000 mg/dL.  STREP PNEUMONIAE URINARY ANTIGEN     Status: None   Collection Time    08/16/14  5:15 AM      Result Value Ref Range   Strep Pneumo Urinary Antigen NEGATIVE  NEGATIVE   Comment:            Infection due to S. pneumoniae     cannot be absolutely ruled out     since the antigen present     may be below the detection limit     of the test.  COMPREHENSIVE METABOLIC PANEL     Status: Abnormal   Collection Time    08/16/14  6:29 AM      Result Value Ref Range   Sodium 143  137 - 147 mEq/L   Potassium 4.2  3.7 - 5.3 mEq/L   Chloride 103  96 - 112 mEq/L   CO2  27  19 - 32 mEq/L   Glucose, Bld 192 (*) 70 - 99 mg/dL   BUN 22  6 - 23 mg/dL   Creatinine, Ser 0.95  0.50 - 1.10 mg/dL   Calcium 8.5  8.4 - 10.5 mg/dL   Total Protein 6.0  6.0 - 8.3 g/dL   Albumin 3.2 (*) 3.5 - 5.2 g/dL   AST 23  0 - 37 U/L   Comment: HEMOLYSIS AT THIS LEVEL MAY AFFECT RESULT   ALT 11  0 - 35 U/L   Alkaline Phosphatase 47  39 - 117 U/L  Total Bilirubin 0.4  0.3 - 1.2 mg/dL   GFR calc non Af Amer 53 (*) >90 mL/min   GFR calc Af Amer 62 (*) >90 mL/min   Comment: (NOTE)     The eGFR has been calculated using the CKD EPI equation.     This calculation has not been validated in all clinical situations.     eGFR's persistently <90 mL/min signify possible Chronic Kidney     Disease.   Anion gap 13  5 - 15  CBC WITH DIFFERENTIAL     Status: Abnormal   Collection Time    08/16/14  6:29 AM      Result Value Ref Range   WBC 7.5  4.0 - 10.5 K/uL   RBC 3.05 (*) 3.87 - 5.11 MIL/uL   Hemoglobin 10.0 (*) 12.0 - 15.0 g/dL   HCT 31.8 (*) 36.0 - 46.0 %   MCV 104.3 (*) 78.0 - 100.0 fL   MCH 32.8  26.0 - 34.0 pg   MCHC 31.4  30.0 - 36.0 g/dL   RDW 17.1 (*) 11.5 - 15.5 %   Platelets 241  150 - 400 K/uL   Neutrophils Relative % 87 (*) 43 - 77 %   Neutro Abs 6.5  1.7 - 7.7 K/uL   Lymphocytes Relative 11 (*) 12 - 46 %   Lymphs Abs 0.8  0.7 - 4.0 K/uL   Monocytes Relative 2 (*) 3 - 12 %   Monocytes Absolute 0.1  0.1 - 1.0 K/uL   Eosinophils Relative 0  0 - 5 %   Eosinophils Absolute 0.0  0.0 - 0.7 K/uL   Basophils Relative 0  0 - 1 %   Basophils Absolute 0.0  0.0 - 0.1 K/uL  TROPONIN I     Status: None   Collection Time    08/16/14  6:29 AM      Result Value Ref Range   Troponin I <0.30  <0.30 ng/mL   Comment:            Due to the release kinetics of cTnI,     a negative result within the first hours     of the onset of symptoms does not rule out     myocardial infarction with certainty.     If myocardial infarction is still suspected,     repeat the test at  appropriate intervals.   Labs are reviewed.  Current Facility-Administered Medications  Medication Dose Route Frequency Provider Last Rate Last Dose  . 0.9 %  sodium chloride infusion  250 mL Intravenous PRN Shanda Howells, MD      . acetaminophen (TYLENOL) tablet 500 mg  500 mg Oral Q6H PRN Charlynne Cousins, MD      . albuterol (PROVENTIL) (2.5 MG/3ML) 0.083% nebulizer solution 3 mL  3 mL Inhalation Q6H PRN Charlynne Cousins, MD      . ALPRAZolam Duanne Moron) tablet 0.5 mg  0.5 mg Oral TID Charlynne Cousins, MD      . aspirin tablet 325 mg  325 mg Oral Daily Shanda Howells, MD   325 mg at 08/16/14 0111  . aztreonam (AZACTAM) 2 g in dextrose 5 % 50 mL IVPB  2 g Intravenous 3 times per day Shanda Howells, MD   2 g at 08/16/14 0553  . carvedilol (COREG) tablet 6.25 mg  6.25 mg Oral BID WC Charlynne Cousins, MD      . divalproex (DEPAKOTE) DR tablet 125 mg  125 mg Oral  TID WC Shanda Howells, MD      . enoxaparin (LOVENOX) injection 80 mg  80 mg Subcutaneous BID Shanda Howells, MD   80 mg at 08/16/14 0602  . gabapentin (NEURONTIN) capsule 200 mg  200 mg Oral TID Shanda Howells, MD   200 mg at 08/16/14 0104  . haloperidol lactate (HALDOL) injection 2 mg  2 mg Intravenous Q6H PRN Charlynne Cousins, MD      . losartan (COZAAR) tablet 50 mg  50 mg Oral Daily Shanda Howells, MD       And  . hydrochlorothiazide (MICROZIDE) capsule 12.5 mg  12.5 mg Oral Daily Shanda Howells, MD      . insulin aspart (novoLOG) injection 0-9 Units  0-9 Units Subcutaneous 6 times per day Shanda Howells, MD   2 Units at 08/16/14 0601  . ipratropium-albuterol (DUONEB) 0.5-2.5 (3) MG/3ML nebulizer solution 3 mL  3 mL Nebulization Q2H PRN Shanda Howells, MD      . ipratropium-albuterol (DUONEB) 0.5-2.5 (3) MG/3ML nebulizer solution 3 mL  3 mL Nebulization TID Shanda Howells, MD   3 mL at 08/16/14 0800  . [START ON 08/17/2014] levothyroxine (SYNTHROID, LEVOTHROID) tablet 175 mcg  175 mcg Oral Once per day on Sun Mon Wed Fri Sat Shanda Howells, MD      . levothyroxine (SYNTHROID, LEVOTHROID) tablet 87.5 mcg  87.5 mcg Oral Once per day on Tue Thu Shanda Howells, MD   87.5 mcg at 08/16/14 4259  . OLANZapine (ZYPREXA) tablet 5 mg  5 mg Oral BID Shanda Howells, MD   5 mg at 08/16/14 0103  . pantoprazole (PROTONIX) EC tablet 40 mg  40 mg Oral QODAY Shanda Howells, MD   40 mg at 08/16/14 0104  . polyvinyl alcohol (LIQUIFILM TEARS) 1.4 % ophthalmic solution 1 drop  1 drop Both Eyes BID PRN Shanda Howells, MD      . potassium chloride SA (K-DUR,KLOR-CON) CR tablet 20 mEq  20 mEq Oral Daily Shanda Howells, MD      . prednisoLONE tablet 40 mg  40 mg Oral Daily Charlynne Cousins, MD      . rivastigmine (EXELON) 4.6 mg/24hr 4.6 mg  4.6 mg Transdermal q morning - 10a Shanda Howells, MD      . sertraline (ZOLOFT) tablet 50 mg  50 mg Oral Daily Shanda Howells, MD      . sodium chloride 0.9 % injection 3 mL  3 mL Intravenous Q12H Shanda Howells, MD      . sodium chloride 0.9 % injection 3 mL  3 mL Intravenous Q12H Shanda Howells, MD   3 mL at 08/16/14 0102  . sodium chloride 0.9 % injection 3 mL  3 mL Intravenous PRN Shanda Howells, MD      . traZODone (DESYREL) tablet 150 mg  150 mg Oral QHS Shanda Howells, MD   150 mg at 08/16/14 0103  . vancomycin (VANCOCIN) IVPB 750 mg/150 ml premix  750 mg Intravenous Q12H Pat Patrick, RPH   750 mg at 08/16/14 0202    Psychiatric Specialty Exam: Physical Exam  ROS  Blood pressure 120/49, pulse 60, temperature 97.9 F (36.6 C), temperature source Oral, resp. rate 20, height _0  (1.651 m), weight 82.6 kg (182 lb 1.6 oz), SpO2 94.00%.Body mass index is 30.3 kg/(m^2).  General Appearance: Casual  Eye Contact::  Fair, legally blind, unable to count fingers without touching  Speech:  Blocked, unable to recall the words that she knows - word finding issue  Volume:  Normal  Mood:  Anxious  Affect:  Depressed and Inappropriate  Thought Process:  Disorganized and Irrelevant  Orientation:  Full (Time, Place,  and Person)  Thought Content:  Delusions  Suicidal Thoughts:  No  Homicidal Thoughts:  No  Memory:  Immediate;   Poor Recent;   Poor  Judgement:  Impaired  Insight:  Lacking  Psychomotor Activity:  Decreased and Restlessness  Concentration:  Poor  Recall:  [place] of Knowledge:Fair  Language: Fair  Akathisia:  NA  Handed:  Right  AIMS (if indicated):     Assets:  Desire for Improvement Housing Intimacy Leisure Time Social Support  Sleep:      Musculoskeletal: Strength & Muscle Tone: decreased Gait & Station: unable to stand Patient leans: N/A  Treatment Plan Summary: Daily contact with patient to assess and evaluate symptoms and progress in treatment Medication management  Laneya Gasaway,JANARDHAHA R. 08/16/2014 9:51 AM

## 2014-08-16 NOTE — Progress Notes (Signed)
[facility] PROGRESS NOTE Interim History: 80 y.o. year old female with significant past medical history of end stage dementia, TIA, type 2 DM, chronic diastolic CHF, chronic asthma/COPD presenting with acute resp failure with hypoxia, fall, weakness, LE swelling. Per the husband, pt was recently admitted to SNF over past 2-3 weeks. Was discharged home 3-4 days ago.Husband states that he felt very uncomfotable about pt coming home. Since coming home, pt has had worsening SOB, LE swelling and weakness. Husband states that pt slid down the side of the bed the day prior to admission, where she was unable to move for approx 5 hours.    Assessment/Plan: Acute respiratory failure possibly multifactorial Acute diastolic heart failure, NYHA class 2 and COPD: - I am not sure at this point weather is COPD or HF. Her Cr did not worsen with IV lasix she has no JVD and just mild lower extremity edema. Most likely this seems to be a COPD exacerbation. - Lowest weight in the system is 77 kg. Her admission weight is 82 kg. - Started on IV Lasix, single dose.. Monitor electrolytes. - She had wheezing on exam none today. Will do a quick steroid tapred - Family would like to meet with PMT - cont steroids, inhalers and antibiotics. - d/c v/q scan sats improved and tachycardia resolved.  Leukocytosis: - Now improved, while on steroids.  Advance Dementia  With superimpose Encephalopathy - will consult psyq, she aggressive with delusional paranoia.  - unlikely stroke she is moving all 4 ext with out difficulties.   Hypertension  Resume home dose losartan and  Lasix.  Hypothyroidism  Continue Synthroid  Disposition: pending further evaluation  Code Status: Full Code     Consultants:  NOne  Procedures:  CXR  Antibiotics:  vanc/aztreonam single dose on 10.5.2015  Doxy 10.6.2015  HPI/Subjective: complaining that her husband is cheating on her with a aggressive tone and demeanor  toward her husband.  Objective: Filed Vitals:   08/15/14 2145 08/16/14 0039 08/16/14 0447 08/16/14 0800  BP: 127/48  120/49   Pulse: 63  60   Temp: 97.8 F (36.6 C)  97.9 F (36.6 C)   TempSrc: Oral  Oral   Resp: 20  20   Height: 5\' 5"  (1.651 m)     Weight: 82.6 kg (182 lb 1.6 oz)     SpO2: 100% 99% 99% 94%    Intake/Output Summary (Last 24 hours) at 08/16/14 0942 Last data filed at 08/16/14 0837  Gross per 24 hour  Intake      0 ml  Output      0 ml  Net      0 ml   Filed Weights   08/15/14 2145  Weight: 82.6 kg (182 lb 1.6 oz)    Exam:  General: Alert, awake, oriented x1, in no acute distress.  HEENT: No bruits, no goiter. -JVD Heart: Regular rate and rhythm Lungs: Good air movement, clear Abdomen: Soft, nontender, nondistended, positive bowel sounds.  Neuro: Grossly intact, nonfocal.   Data Reviewed: Basic Metabolic Panel:  Recent Labs Lab 08/15/14 1735 08/16/14 0629  NA 145 143  K 4.7 4.2  CL 105 103  CO2 29 27  GLUCOSE 132* 192*  BUN 21 22  CREATININE 1.09 0.95  CALCIUM 8.6 8.5   Liver Function Tests:  Recent Labs Lab 08/15/14 1735 08/16/14 0629  AST 27 23  ALT 12 11  ALKPHOS 64 47  BILITOT 0.3 0.4  PROT 6.4 6.0  ALBUMIN 3.3* 3.2*  No results found for this basename: LIPASE, AMYLASE,  in the last 168 hours  Recent Labs Lab 08/15/14 2324  AMMONIA 31   CBC:  Recent Labs Lab 08/15/14 1735 08/16/14 0629  WBC 11.4* 7.5  NEUTROABS 6.5 6.5  HGB 10.2* 10.0*  HCT 32.9* 31.8*  MCV 107.5* 104.3*  PLT 268 241   Cardiac Enzymes:  Recent Labs Lab 08/15/14 2324 08/16/14 0629  CKTOTAL 90  --   CKMB 2.8  --   TROPONINI <0.30 <0.30   BNP (last 3 results)  Recent Labs  03/22/14 1054 08/15/14 1735  PROBNP 183.0* 171.4   CBG:  Recent Labs Lab 08/15/14 1938 08/16/14 0044 08/16/14 0151 08/16/14 0450  GLUCAP 115* 115* 111* 175*    No results found for this or any previous visit (from the past 240 hour(s)).    Studies: Dg Chest 2 View  08/15/2014   CLINICAL DATA:  Status post fall last night ; mental status change; history of COPD, CHF, diabetes, and leg swelling  EXAM: CHEST  2 VIEW  COMPARISON:  Portable chest x-ray of July 09, 2014  FINDINGS: The lungs remain mildly hyperinflated. The interstitial markings are mildly increased overall. A small amount of pleural fluid blunts the lateral and posterior costophrenic angles on the left. The cardiac silhouette is enlarged. The pulmonary vascularity is mildly engorged. The bony thorax is unremarkable. The observed portions of the bony thorax exhibit no acute abnormalities.  IMPRESSION: Low-grade CHF superimposed upon COPD. There is a small left pleural effusion. There is no evidence of pneumonia.   Electronically Signed   By: David  [place]   On: 08/15/2014 18:42    Scheduled Meds: . ALPRAZolam  0.5 mg Oral TID  . aspirin  325 mg Oral Daily  . aztreonam  2 g Intravenous 3 times per day  . carvedilol  6.25 mg Oral BID WC  . divalproex  125 mg Oral TID WC  . enoxaparin (LOVENOX) injection  80 mg Subcutaneous BID  . gabapentin  200 mg Oral TID  . losartan  50 mg Oral Daily   And  . hydrochlorothiazide  12.5 mg Oral Daily  . insulin aspart  0-9 Units Subcutaneous 6 times per day  . ipratropium-albuterol  3 mL Nebulization TID  . [START ON 08/17/2014] levothyroxine  175 mcg Oral Once per day on Sun Mon Wed Fri Sat  . levothyroxine  87.5 mcg Oral Once per day on Tue Thu  . OLANZapine  5 mg Oral BID  . pantoprazole  40 mg Oral QODAY  . potassium chloride SA  20 mEq Oral Daily  . rivastigmine  4.6 mg Transdermal q morning - 10a  . sertraline  50 mg Oral Daily  . sodium chloride  3 mL Intravenous Q12H  . sodium chloride  3 mL Intravenous Q12H  . traZODone  150 mg Oral QHS  . vancomycin  750 mg Intravenous Q12H   Continuous Infusions:    Marinda Elk  [facility] Pager [phone]. If 8PM-8AM, please contact night-coverage at  [email or web address], password [facility] 08/16/2014, 9:42 AM  LOS: 1 day

## 2014-08-16 NOTE — ED Provider Notes (Signed)
I saw and evaluated the patient, reviewed the resident's note and I agree with the findings and plan.   EKG Interpretation   Date/Time:  Monday August 15 2014 17:21:33 EDT Ventricular Rate:  69 PR Interval:    QRS Duration: 92 QT Interval:  457 QTC Calculation: 490 R Axis:   48 Text Interpretation:  Atrial fibrillation Borderline prolonged QT interval  Confirmed by Freida BusmanALLEN  MD, Mearl Olver ([id number]) on 08/15/2014 7:29:19 PM       Toy BakerAnthony T Tramond Slinker, MD 08/16/14 1207

## 2014-08-16 NOTE — Progress Notes (Signed)
Patient ZO:XWRUE:Paula [name]      DOB: 04-Jul-1927      AVW:[id number]  [name] stays overnight so he has agreed to meet me first thing in the morning before he heads off to go home.. Will see him shortly after 7 am tomorrow.   Nadiah Corbit L. Ladona Ridgelaylor, MD MBA The Palliative Medicine Team at [facility] Team Phone: [phone] Pager: [phone] ( Use team phone after hours)

## 2014-08-16 NOTE — Progress Notes (Signed)
Pt refusing to let NT and RN do foley care.  Will continue to monitor.

## 2014-08-16 NOTE — Care Management Note (Addendum)
    Page 1 of 1   08/18/2014     2:12:15 PM CARE MANAGEMENT NOTE 08/18/2014  Patient:  Paula [name],Paula [name]   Account Number:  [id number]  Date Initiated:  08/16/2014  Documentation [facility]:  [facility]  Subjective/Objective Assessment:   80 y.o. year old female with significant past medical history of end stage dementia, TIA, type 2 DM, chronic diastolic CHF, chronic asthma/COPD  presenting with acute resp failure with hypoxia.//Home with spouse.     Action/Plan:   IV lasix, IV solumedrol, duonebs.//Access for disposition needs.   Anticipated DC Date:  08/18/2014   Anticipated DC Plan:  SKILLED NURSING FACILITY  In-house referral  Clinical Social Worker      DC Planning Services  CM consult      Choice offered to / List presented to:             Status of service:  Completed, signed off Medicare Important Message given?  YES (If response is "NO", the following Medicare IM given date fields will be blank) Date Medicare IM given:  08/18/2014 Medicare IM given by:  [facility]Aljean Horiuchi Date Additional Medicare IM given:   Additional Medicare IM given by:    Discharge Disposition:    Per UR Regulation:  Reviewed for med. necessity/level of care/duration of stay  If discussed at Long Length of Stay Meetings, dates discussed:    Comments:  08/18/14 Oletta Cohnamellia Perris Tripathi, RN, BSN, NCM [phone] [facility].

## 2014-08-16 NOTE — Progress Notes (Signed)
Pt. With elevated D-dimer. On call NP, K. Schorr, notified. No new orders at this time. RN will continue to monitor pt. For changes in condition. Paula [name], Paula [name]

## 2014-08-17 ENCOUNTER — Ambulatory Visit: Payer: Medicare Other | Admitting: Nurse Practitioner

## 2014-08-17 DIAGNOSIS — Z515 Encounter for palliative care: Secondary | ICD-10-CM

## 2014-08-17 DIAGNOSIS — I10 Essential (primary) hypertension: Secondary | ICD-10-CM

## 2014-08-17 DIAGNOSIS — E1159 Type 2 diabetes mellitus with other circulatory complications: Secondary | ICD-10-CM

## 2014-08-17 DIAGNOSIS — E039 Hypothyroidism, unspecified: Secondary | ICD-10-CM

## 2014-08-17 DIAGNOSIS — I5032 Chronic diastolic (congestive) heart failure: Secondary | ICD-10-CM

## 2014-08-17 DIAGNOSIS — F329 Major depressive disorder, single episode, unspecified: Secondary | ICD-10-CM

## 2014-08-17 DIAGNOSIS — J439 Emphysema, unspecified: Secondary | ICD-10-CM

## 2014-08-17 LAB — COMPREHENSIVE METABOLIC PANEL
ALBUMIN: 2.9 g/dL — AB (ref 3.5–5.2)
ALK PHOS: 45 U/L (ref 39–117)
ALT: 11 U/L (ref 0–35)
ANION GAP: 10 (ref 5–15)
AST: 24 U/L (ref 0–37)
BUN: 23 mg/dL (ref 6–23)
CALCIUM: 8.7 mg/dL (ref 8.4–10.5)
CO2: 26 mEq/L (ref 19–32)
Chloride: 104 mEq/L (ref 96–112)
Creatinine, Ser: 0.93 mg/dL (ref 0.50–1.10)
GFR calc Af Amer: 63 mL/min — ABNORMAL LOW (ref >90)
GFR calc non Af Amer: 54 mL/min — ABNORMAL LOW (ref >90)
Glucose, Bld: 147 mg/dL — ABNORMAL HIGH (ref 70–99)
POTASSIUM: 4.7 meq/L (ref 3.7–5.3)
Sodium: 140 mEq/L (ref 137–147)
Total Bilirubin: 0.4 mg/dL (ref 0.3–1.2)
Total Protein: 5.6 g/dL — ABNORMAL LOW (ref 6.0–8.3)

## 2014-08-17 LAB — CBC WITH DIFFERENTIAL/PLATELET
BASOS PCT: 0 % (ref 0–1)
Basophils Absolute: 0 10*3/uL (ref 0.0–0.1)
EOS ABS: 0 10*3/uL (ref 0.0–0.7)
Eosinophils Relative: 0 % (ref 0–5)
HEMATOCRIT: 29.7 % — AB (ref 36.0–46.0)
HEMOGLOBIN: 9.6 g/dL — AB (ref 12.0–15.0)
LYMPHS PCT: 15 % (ref 12–46)
Lymphs Abs: 1.8 10*3/uL (ref 0.7–4.0)
MCH: 33.8 pg (ref 26.0–34.0)
MCHC: 32.3 g/dL (ref 30.0–36.0)
MCV: 104.6 fL — ABNORMAL HIGH (ref 78.0–100.0)
Monocytes Absolute: 1.1 10*3/uL — ABNORMAL HIGH (ref 0.1–1.0)
Monocytes Relative: 9 % (ref 3–12)
Neutro Abs: 9.3 10*3/uL — ABNORMAL HIGH (ref 1.7–7.7)
Neutrophils Relative %: 76 % (ref 43–77)
Platelets: 299 10*3/uL (ref 150–400)
RBC: 2.84 MIL/uL — ABNORMAL LOW (ref 3.87–5.11)
RDW: 16.4 % — ABNORMAL HIGH (ref 11.5–15.5)
WBC: 12.2 10*3/uL — ABNORMAL HIGH (ref 4.0–10.5)

## 2014-08-17 LAB — URINE CULTURE
COLONY COUNT: NO GROWTH
Culture: NO GROWTH

## 2014-08-17 LAB — GLUCOSE, CAPILLARY
GLUCOSE-CAPILLARY: 144 mg/dL — AB (ref 70–99)
GLUCOSE-CAPILLARY: 181 mg/dL — AB (ref 70–99)
GLUCOSE-CAPILLARY: 211 mg/dL — AB (ref 70–99)
GLUCOSE-CAPILLARY: 231 mg/dL — AB (ref 70–99)
Glucose-Capillary: 117 mg/dL — ABNORMAL HIGH (ref 70–99)

## 2014-08-17 MED ORDER — LOSARTAN POTASSIUM 50 MG PO TABS
50.0000 mg | ORAL_TABLET | Freq: Every day | ORAL | Status: DC
  Administered 2014-08-17 – 2014-08-18 (×2): 50 mg via ORAL
  Filled 2014-08-17 (×2): qty 1

## 2014-08-17 MED ORDER — CLONAZEPAM 0.5 MG PO TABS
0.5000 mg | ORAL_TABLET | Freq: Two times a day (BID) | ORAL | Status: DC
  Administered 2014-08-17 – 2014-08-18 (×2): 0.5 mg via ORAL
  Filled 2014-08-17 (×2): qty 1

## 2014-08-17 MED ORDER — FUROSEMIDE 40 MG PO TABS
40.0000 mg | ORAL_TABLET | Freq: Every day | ORAL | Status: DC
  Administered 2014-08-17 – 2014-08-18 (×2): 40 mg via ORAL
  Filled 2014-08-17 (×2): qty 1

## 2014-08-17 MED ORDER — INSULIN ASPART 100 UNIT/ML ~~LOC~~ SOLN
0.0000 [IU] | Freq: Three times a day (TID) | SUBCUTANEOUS | Status: DC
  Administered 2014-08-17 – 2014-08-18 (×2): 3 [IU] via SUBCUTANEOUS

## 2014-08-17 NOTE — Progress Notes (Signed)
Cardiac monitor discontinued per order. CCMD notified. Will continue to monitor.

## 2014-08-17 NOTE — Progress Notes (Signed)
[facility] PROGRESS NOTE Interim History: 80 y.o. year old female with significant past medical history of end stage dementia, TIA, type 2 DM, chronic diastolic CHF, chronic asthma/COPD presenting with acute resp failure with hypoxia, fall, weakness, LE swelling. Per the husband, pt was recently admitted to SNF over past 2-3 weeks. Was discharged home 3-4 days ago.Husband states that he felt very uncomfotable about pt coming home. Since coming home, pt has had worsening SOB, LE swelling and weakness. Husband states that pt slid down the side of the bed the day prior to admission, where she was unable to move for approx 5 hours.    Assessment/Plan: Acute on chronic respiratory failure:  possibly multifactorial Acute diastolic heart failure, NYHA class 2 and COPD: - Lowest weight in the system is 77 kg. Her admission weight is 82 kg now down to 81 kg - continue PO lasix and monitor electrolytes and renal function -continue quick steroid tapering, antibiotics and inhalers  -per goals of care discussion; patient is now DNR and will require permanent placement - d/c v/q scan as O2 sats/tachypnea improved and tachycardia resolved. -will follow response/evolution -continue home dose of lasix  Leukocytosis: - Now improved -will follow trend  Advance Dementia  With superimpose Encephalopathy -per psych recommendations will start risperdal, check Valproic level and neurontin -will follow response and further recommendations.    Hypertension  -continue home dose losartan and  Lasix.  Hypothyroidism  -Continue Synthroid  Disposition: pending further evaluation; SNF hopefully in am (08-18-2014) Code Status: DNR   Consultants:  NOne  Procedures:  CXR  Antibiotics:  vanc/aztreonam single dose on 10.5.2015  Doxy 10.6.2015  HPI/Subjective: Afebrile, patient continue to be oriented just to person and with significant confabulation throughout interview. Per nursing staff  patient continue exhibiting agitation and combative behavior throughout night. Also continue referred unrespectfully way towards husband.   Objective: Filed Vitals:   08/17/14 0606 08/17/14 0900 08/17/14 1333 08/17/14 1334  BP: 131/46 155/63  134/65  Pulse: 78 80  90  Temp: 98.5 F (36.9 C) 98.8 F (37.1 C)  98.8 F (37.1 C)  TempSrc: Oral Oral  Oral  Resp: 18 18  20   Height:      Weight: 81.9 kg (180 lb 8.9 oz)     SpO2: 92% 98% 99% 99%    Intake/Output Summary (Last 24 hours) at 08/17/14 1604 Last data filed at 08/17/14 1028  Gross per 24 hour  Intake   1200 ml  Output   1500 ml  Net   -300 ml   Filed Weights   08/15/14 2145 08/17/14 0606  Weight: 82.6 kg (182 lb 1.6 oz) 81.9 kg (180 lb 8.9 oz)    Exam:  General: Alert, awake, oriented x1, in no acute distress; denies CP or SOB.  HEENT: No bruits, no goiter. -JVD Heart: Regular rate and rhythm, no rubs or gallops Lungs: Good air movement, scattered rhonchi Abdomen: Soft, nontender, nondistended, positive bowel sounds.  Neuro: Grossly intact, nonfocal.   Data Reviewed: Basic Metabolic Panel:  Recent Labs Lab 08/15/14 1735 08/16/14 0629 08/17/14 0549  NA 145 143 140  K 4.7 4.2 4.7  CL 105 103 104  CO2 29 27 26   GLUCOSE 132* 192* 147*  BUN 21 22 23   CREATININE 1.09 0.95 0.93  CALCIUM 8.6 8.5 8.7   Liver Function Tests:  Recent Labs Lab 08/15/14 1735 08/16/14 0629 08/17/14 0549  AST 27 23 24   ALT 12 11 11   ALKPHOS 64 47 45  BILITOT 0.3 0.4 0.4  PROT 6.4 6.0 5.6*  ALBUMIN 3.3* 3.2* 2.9*    Recent Labs Lab 08/15/14 2324  AMMONIA 31   CBC:  Recent Labs Lab 08/15/14 1735 08/16/14 0629 08/17/14 0549  WBC 11.4* 7.5 12.2*  NEUTROABS 6.5 6.5 9.3*  HGB 10.2* 10.0* 9.6*  HCT 32.9* 31.8* 29.7*  MCV 107.5* 104.3* 104.6*  PLT 268 241 299   Cardiac Enzymes:  Recent Labs Lab 08/15/14 2324 08/16/14 0629 08/16/14 0955  CKTOTAL 90  --   --   CKMB 2.8  --   --   TROPONINI <0.30 <0.30  <0.30   BNP (last 3 results)  Recent Labs  03/22/14 1054 08/15/14 1735  PROBNP 183.0* 171.4   CBG:  Recent Labs Lab 08/16/14 0450 08/16/14 1048 08/17/14 0012 08/17/14 0602 08/17/14 1123  GLUCAP 175* 205* 231* 144* 181*    Recent Results (from the past 240 hour(s))  CULTURE, BLOOD (ROUTINE X 2)     Status: None   Collection Time    08/15/14 11:24 PM      Result Value Ref Range Status   Specimen Description BLOOD RIGHT ARM   Final   Special Requests BOTTLES DRAWN AEROBIC ONLY 5CC   Final   Culture  Setup Time     Final   Value: 08/16/2014 04:11     Performed at [facility]   Culture     Final   Value:        BLOOD CULTURE RECEIVED NO GROWTH TO DATE CULTURE WILL BE HELD FOR 5 DAYS BEFORE ISSUING A FINAL NEGATIVE REPORT     Performed at [facility]   Report Status PENDING   Incomplete  CULTURE, BLOOD (ROUTINE X 2)     Status: None   Collection Time    08/15/14 11:36 PM      Result Value Ref Range Status   Specimen Description BLOOD LEFT HAND   Final   Special Requests BOTTLES DRAWN AEROBIC ONLY 8CC   Final   Culture  Setup Time     Final   Value: 08/16/2014 04:10     Performed at [facility]   Culture     Final   Value:        BLOOD CULTURE RECEIVED NO GROWTH TO DATE CULTURE WILL BE HELD FOR 5 DAYS BEFORE ISSUING A FINAL NEGATIVE REPORT     Performed at [facility]   Report Status PENDING   Incomplete  URINE CULTURE     Status: None   Collection Time    08/16/14  5:15 AM      Result Value Ref Range Status   Specimen Description URINE, CLEAN CATCH   Final   Special Requests NONE   Final   Culture  Setup Time     Final   Value: 08/16/2014 09:00     Performed at [facility] Count     Final   Value: NO GROWTH     Performed at [facility]   Culture     Final   Value: NO GROWTH     Performed at [facility]   Report Status 08/17/2014 FINAL   Final     Studies: Dg Chest 2 View  08/15/2014    CLINICAL DATA:  Status post fall last night ; mental status change; history of COPD, CHF, diabetes, and leg swelling  EXAM: CHEST  2 VIEW  COMPARISON:  Portable chest x-ray of  July 09, 2014  FINDINGS: The lungs remain mildly hyperinflated. The interstitial markings are mildly increased overall. A small amount of pleural fluid blunts the lateral and posterior costophrenic angles on the left. The cardiac silhouette is enlarged. The pulmonary vascularity is mildly engorged. The bony thorax is unremarkable. The observed portions of the bony thorax exhibit no acute abnormalities.  IMPRESSION: Low-grade CHF superimposed upon COPD. There is a small left pleural effusion. There is no evidence of pneumonia.   Electronically Signed   By: David  [place]   On: 08/15/2014 18:42    Scheduled Meds: . aspirin  325 mg Oral Daily  . carvedilol  6.25 mg Oral BID WC  . clonazePAM  0.5 mg Oral BID  . divalproex  125 mg Oral TID WC  . doxycycline  100 mg Oral Q12H  . enoxaparin (LOVENOX) injection  40 mg Subcutaneous Q24H  . furosemide  40 mg Oral Daily  . insulin aspart  0-9 Units Subcutaneous TID WC  . ipratropium-albuterol  3 mL Nebulization TID  . levothyroxine  175 mcg Oral Once per day on Sun Mon Wed Fri Sat  . levothyroxine  87.5 mcg Oral Once per day on Tue Thu  . losartan  50 mg Oral Daily  . OLANZapine  5 mg Oral BID  . pantoprazole  40 mg Oral QODAY  . potassium chloride SA  20 mEq Oral Daily  . predniSONE  40 mg Oral Q breakfast  . rivastigmine  4.6 mg Transdermal q morning - 10a  . sodium chloride  3 mL Intravenous Q12H  . sodium chloride  3 mL Intravenous Q12H  . traZODone  150 mg Oral QHS   Continuous Infusions:   Time: < 30 minutes  Vassie Loll  [facility] Pager [phone]. If 8PM-8AM, please contact night-coverage at [email or web address], password [facility] 08/17/2014, 4:04 PM  LOS: 2 days

## 2014-08-17 NOTE — Progress Notes (Signed)
[facility] MD Progress Note  08/17/2014 2:19 PM Paula [name]  MRN:  [id number]  Subjective:  Patient is seen for psych consultation follow up and spoke with Dr. Dyann Kief and staff RN and patient husband; Patient has severe memory deficits, paranoia, poor insight, and judgment. She is not able to orient to place, persons, time or situation. She know her name and year she was born, and identify her husband. She is legally blind and has paranoid thoughts about her husband, saying he is making up her problems and he does not want her to do well. She is compliant with her medication treatment except refused one night as per staff RN.  Medical history: This is a 80 y.o. year old female with significant past medical history of end stage dementia, TIA, type 2 DM, chronic diastolic CHF, chronic asthma/COPD presenting with acute resp failure with hypoxia, fall, weakness, LE swelling. Per the husband, pt was recently admitted to SNF over past 2-3 weeks. Was discharged home 3-4 days ago. Prior to SNF admission, pt was at admitted at behavioral health. Husband states that while there, physician had d/c'd one of her fluid medications. Since this point, pt has had worsening LE swelling. Husband states that he felt very uncomfotable about pt coming home. Since coming home, pt has had worsening SOB, LE swelling and weakness. Husband states that pt slid down the side of the bed yesterday, where she was unable to move for approx 5 hours. Husband states that he was waiting for her to get up, but she finally pulled herself up to the bed after 5 hour period. Sxs progressively worsened over the course of the day and husband called EMS. Husband also reports pt with intermittent complaints of numbness in LEs as well is increased cough, wheezing and albuterol use.  Diagnosis:   DSM5: Schizophrenia Disorders:  Delusional Disorder (297.1) Obsessive-Compulsive Disorders:   Trauma-Stressor Disorders:   Substance/Addictive Disorders:    Depressive Disorders:  Major Depressive Disorder - Unspecified (296.20) Total Time spent with patient: 30 minutes  Axis I: Dementia with psychosis - paranoid delusion  ADL's:  Impaired  Sleep: Fair  Appetite:  Fair  Suicidal Ideation:  denied Homicidal Ideation:  denied AEB (as evidenced by):  Psychiatric Specialty Exam: Physical Exam  ROS  Blood pressure 134/65, pulse 90, temperature 98.8 F (37.1 C), temperature source Oral, resp. rate 20, height 5' 5"  (1.651 m), weight 81.9 kg (180 lb 8.9 oz), SpO2 99.00%.Body mass index is 30.05 kg/(m^2).  General Appearance: Bizarre and Guarded  Eye Contact::  Fair  Speech:  Clear and Coherent  Volume:  Normal  Mood:  Anxious  Affect:  Depressed  Thought Process:  Disorganized, Irrelevant and Tangential  Orientation:  Other:  she is oriented to her name and recognise husband and brother in law.  Thought Content:  Delusions and Rumination  Suicidal Thoughts:  No  Homicidal Thoughts:  No  Memory:  Immediate;   Poor Recent;   Poor  Judgement:  Poor  Insight:  Shallow  Psychomotor Activity:  Decreased  Concentration:  Fair  Recall:  Poor  Fund of Knowledge:Poor  Language: Fair  Akathisia:  NA  Handed:  Right  AIMS (if indicated):     Assets:  Desire for Improvement Financial Resources/Insurance Housing Leisure Time Resilience Social Support  Sleep:      Musculoskeletal: Strength & Muscle Tone: decreased Gait & Station: unable to stand Patient leans: N/A  Current Medications: Current Facility-Administered Medications  Medication Dose Route Frequency Provider Last  Rate Last Dose  . 0.9 %  sodium chloride infusion  250 mL Intravenous PRN Shanda Howells, MD      . acetaminophen (TYLENOL) tablet 500 mg  500 mg Oral Q6H PRN Charlynne Cousins, MD   500 mg at 08/17/14 0010  . ALPRAZolam Duanne Moron) tablet 0.5 mg  0.5 mg Oral TID Charlynne Cousins, MD   0.5 mg at 08/17/14 1013  . aspirin tablet 325 mg  325 mg Oral Daily Shanda Howells, MD   325 mg at 08/17/14 1013  . carvedilol (COREG) tablet 6.25 mg  6.25 mg Oral BID WC Charlynne Cousins, MD   6.25 mg at 08/17/14 1610  . divalproex (DEPAKOTE) DR tablet 125 mg  125 mg Oral TID WC Shanda Howells, MD   125 mg at 08/16/14 1159  . doxycycline (VIBRA-TABS) tablet 100 mg  100 mg Oral Q12H Charlynne Cousins, MD   100 mg at 08/17/14 1014  . enoxaparin (LOVENOX) injection 40 mg  40 mg Subcutaneous Q24H Arman Bogus, RPH   40 mg at 08/17/14 1016  . furosemide (LASIX) tablet 40 mg  40 mg Oral Daily Barton Dubois, MD   40 mg at 08/17/14 1013  . gabapentin (NEURONTIN) capsule 200 mg  200 mg Oral TID Shanda Howells, MD   200 mg at 08/17/14 1013  . haloperidol lactate (HALDOL) injection 2 mg  2 mg Intravenous Q6H PRN Charlynne Cousins, MD   2 mg at 08/16/14 2038  . insulin aspart (novoLOG) injection 0-9 Units  0-9 Units Subcutaneous TID WC Barton Dubois, MD      . ipratropium-albuterol (DUONEB) 0.5-2.5 (3) MG/3ML nebulizer solution 3 mL  3 mL Nebulization Q2H PRN Shanda Howells, MD      . ipratropium-albuterol (DUONEB) 0.5-2.5 (3) MG/3ML nebulizer solution 3 mL  3 mL Nebulization TID Shanda Howells, MD   3 mL at 08/17/14 1332  . levothyroxine (SYNTHROID, LEVOTHROID) tablet 175 mcg  175 mcg Oral Once per day on Sun Mon Wed Fri Sat Shanda Howells, MD   175 mcg at 08/17/14 [phone]  . levothyroxine (SYNTHROID, LEVOTHROID) tablet 87.5 mcg  87.5 mcg Oral Once per day on Tue Thu Shanda Howells, MD   87.5 mcg at 08/16/14 5409  . losartan (COZAAR) tablet 50 mg  50 mg Oral Daily Barton Dubois, MD   50 mg at 08/17/14 1013  . OLANZapine (ZYPREXA) tablet 5 mg  5 mg Oral BID Shanda Howells, MD   5 mg at 08/17/14 1014  . pantoprazole (PROTONIX) EC tablet 40 mg  40 mg Oral Jodean Lima, MD   40 mg at 08/17/14 1013  . polyvinyl alcohol (LIQUIFILM TEARS) 1.4 % ophthalmic solution 1 drop  1 drop Both Eyes BID PRN Shanda Howells, MD      . potassium chloride SA (K-DUR,KLOR-CON) CR tablet 20 mEq  20  mEq Oral Daily Shanda Howells, MD   20 mEq at 08/17/14 1014  . predniSONE (DELTASONE) tablet 40 mg  40 mg Oral Q breakfast Charlynne Cousins, MD   40 mg at 08/17/14 [phone]  . rivastigmine (EXELON) 4.6 mg/24hr 4.6 mg  4.6 mg Transdermal q morning - 10a Shanda Howells, MD   4.6 mg at 08/17/14 1000  . sertraline (ZOLOFT) tablet 50 mg  50 mg Oral Daily Shanda Howells, MD   50 mg at 08/17/14 1014  . sodium chloride 0.9 % injection 3 mL  3 mL Intravenous Q12H Shanda Howells, MD   3 mL at 08/16/14 2200  .  sodium chloride 0.9 % injection 3 mL  3 mL Intravenous Q12H Shanda Howells, MD   3 mL at 08/17/14 1014  . sodium chloride 0.9 % injection 3 mL  3 mL Intravenous PRN Shanda Howells, MD      . traZODone (DESYREL) tablet 150 mg  150 mg Oral QHS Shanda Howells, MD   150 mg at 08/17/14 0026    Lab Results:  Results for orders placed during the hospital encounter of 08/15/14 (from the past 48 hour(s))  CBC WITH DIFFERENTIAL     Status: Abnormal   Collection Time    08/15/14  5:35 PM      Result Value Ref Range   WBC 11.4 (*) 4.0 - 10.5 K/uL   RBC 3.06 (*) 3.87 - 5.11 MIL/uL   Hemoglobin 10.2 (*) 12.0 - 15.0 g/dL   HCT 32.9 (*) 36.0 - 46.0 %   MCV 107.5 (*) 78.0 - 100.0 fL   MCH 33.3  26.0 - 34.0 pg   MCHC 31.0  30.0 - 36.0 g/dL   RDW 17.1 (*) 11.5 - 15.5 %   Platelets 268  150 - 400 K/uL   Neutrophils Relative % 58  43 - 77 %   Lymphocytes Relative 25  12 - 46 %   Monocytes Relative 11  3 - 12 %   Eosinophils Relative 6 (*) 0 - 5 %   Basophils Relative 0  0 - 1 %   Neutro Abs 6.5  1.7 - 7.7 K/uL   Lymphs Abs 2.9  0.7 - 4.0 K/uL   Monocytes Absolute 1.3 (*) 0.1 - 1.0 K/uL   Eosinophils Absolute 0.7  0.0 - 0.7 K/uL   Basophils Absolute 0.0  0.0 - 0.1 K/uL   Smear Review MORPHOLOGY UNREMARKABLE    COMPREHENSIVE METABOLIC PANEL     Status: Abnormal   Collection Time    08/15/14  5:35 PM      Result Value Ref Range   Sodium 145  137 - 147 mEq/L   Potassium 4.7  3.7 - 5.3 mEq/L   Comment: HEMOLYSIS  AT THIS LEVEL MAY AFFECT RESULT   Chloride 105  96 - 112 mEq/L   CO2 29  19 - 32 mEq/L   Glucose, Bld 132 (*) 70 - 99 mg/dL   BUN 21  6 - 23 mg/dL   Creatinine, Ser 1.09  0.50 - 1.10 mg/dL   Calcium 8.6  8.4 - 10.5 mg/dL   Total Protein 6.4  6.0 - 8.3 g/dL   Albumin 3.3 (*) 3.5 - 5.2 g/dL   AST 27  0 - 37 U/L   Comment: HEMOLYSIS AT THIS LEVEL MAY AFFECT RESULT   ALT 12  0 - 35 U/L   Comment: HEMOLYSIS AT THIS LEVEL MAY AFFECT RESULT   Alkaline Phosphatase 64  39 - 117 U/L   Total Bilirubin 0.3  0.3 - 1.2 mg/dL   GFR calc non Af Amer 45 (*) >90 mL/min   GFR calc Af Amer 52 (*) >90 mL/min   Comment: (NOTE)     The eGFR has been calculated using the CKD EPI equation.     This calculation has not been validated in all clinical situations.     eGFR's persistently <90 mL/min signify possible Chronic Kidney     Disease.   Anion gap 11  5 - 15  PRO B NATRIURETIC PEPTIDE     Status: None   Collection Time    08/15/14  5:35 PM  Result Value Ref Range   Pro B Natriuretic peptide (BNP) 171.4  0 - 450 pg/mL  CBG MONITORING, ED     Status: Abnormal   Collection Time    08/15/14  7:38 PM      Result Value Ref Range   Glucose-Capillary 115 (*) 70 - 99 mg/dL  D-DIMER, QUANTITATIVE     Status: Abnormal   Collection Time    08/15/14 11:24 PM      Result Value Ref Range   D-Dimer, Quant 2.72 (*) 0.00 - 0.48 ug/mL-FEU   Comment:            AT THE INHOUSE ESTABLISHED CUTOFF     VALUE OF 0.48 ug/mL FEU,     THIS ASSAY HAS BEEN DOCUMENTED     IN THE LITERATURE TO HAVE     A SENSITIVITY AND NEGATIVE     PREDICTIVE VALUE OF AT LEAST     98 TO 99%.  THE TEST RESULT     SHOULD BE CORRELATED WITH     AN ASSESSMENT OF THE CLINICAL     PROBABILITY OF DVT / VTE.  TROPONIN I     Status: None   Collection Time    08/15/14 11:24 PM      Result Value Ref Range   Troponin I <0.30  <0.30 ng/mL   Comment:            Due to the release kinetics of cTnI,     a negative result within the first  hours     of the onset of symptoms does not rule out     myocardial infarction with certainty.     If myocardial infarction is still suspected,     repeat the test at appropriate intervals.  TSH     Status: Abnormal   Collection Time    08/15/14 11:24 PM      Result Value Ref Range   TSH 16.410 (*) 0.350 - 4.500 uIU/mL  HEMOGLOBIN A1C     Status: Abnormal   Collection Time    08/15/14 11:24 PM      Result Value Ref Range   Hemoglobin A1C 6.1 (*) <5.7 %   Comment: (NOTE)                                                                               According to the ADA Clinical Practice Recommendations for 2011, when     HbA1c is used as a screening test:      >=6.5%   Diagnostic of Diabetes Mellitus               (if abnormal result is confirmed)     5.7-6.4%   Increased risk of developing Diabetes Mellitus     References:Diagnosis and Classification of Diabetes Mellitus,Diabetes     SHFW,2637,85(YIFOY 1):S62-S69 and Standards of Medical Care in             Diabetes - 2011,Diabetes Care,2011,34 (Suppl 1):S11-S61.   Mean Plasma Glucose 128 (*) <117 mg/dL   Comment: Performed at [facility]     Status: None   Collection Time    08/15/14 11:24  PM      Result Value Ref Range   Total CK 90  7 - 177 U/L   CK, MB 2.8  0.3 - 4.0 ng/mL   Relative Index RELATIVE INDEX IS INVALID  0.0 - 2.5   Comment: WHEN CK < 100 U/L             VITAMIN D 25 HYDROXY     Status: None   Collection Time    08/15/14 11:24 PM      Result Value Ref Range   Vit D, 25-Hydroxy 42  30 - 89 ng/mL   Comment: (NOTE)     This assay accurately quantifies Vitamin D, which is the sum of the     25-Hydroxy forms of Vitamin D2 and D3.  Studies have shown that the     optimum concentration of 25-Hydroxy Vitamin D is 30 ng/mL or higher.      Concentrations of Vitamin D between 20 and 29 ng/mL are considered to     be insufficient and concentrations less than 20 ng/mL are considered     to  be deficient for Vitamin D.     Performed at [facility]     Status: None   Collection Time    08/15/14 11:24 PM      Result Value Ref Range   Ammonia 31  11 - 60 umol/L  CULTURE, BLOOD (ROUTINE X 2)     Status: None   Collection Time    08/15/14 11:24 PM      Result Value Ref Range   Specimen Description BLOOD RIGHT ARM     Special Requests BOTTLES DRAWN AEROBIC ONLY 5CC     Culture  Setup Time       Value: 08/16/2014 04:11     Performed at [facility]   Culture       Value:        BLOOD CULTURE RECEIVED NO GROWTH TO DATE CULTURE WILL BE HELD FOR 5 DAYS BEFORE ISSUING A FINAL NEGATIVE REPORT     Performed at [facility]   Report Status PENDING    HIV ANTIBODY (ROUTINE TESTING)     Status: None   Collection Time    08/15/14 11:24 PM      Result Value Ref Range   HIV 1&2 Ab, 4th Generation NONREACTIVE  NONREACTIVE   Comment: (NOTE)     A NONREACTIVE HIV Ag/Ab result does not exclude HIV infection since     the time frame for seroconversion is variable. If acute HIV infection     is suspected, a HIV-1 RNA Qualitative TMA test is recommended.     HIV-1/2 Antibody Diff         Not indicated.     HIV-1 RNA, Qual TMA           Not indicated.     PLEASE NOTE: This information has been disclosed to you from records     whose confidentiality may be protected by state law. If your state     requires such protection, then the state law prohibits you from making     any further disclosure of the information without the specific written     consent of the person to whom it pertains, or as otherwise permitted     by law. A general authorization for the release of medical or other     information is NOT sufficient for this purpose.  The performance of this assay has not been clinically validated in     patients less than 51 years old.     Performed at [facility], BLOOD (ROUTINE X 2)     Status: None   Collection Time    08/15/14  11:36 PM      Result Value Ref Range   Specimen Description BLOOD LEFT HAND     Special Requests BOTTLES DRAWN AEROBIC ONLY 8CC     Culture  Setup Time       Value: 08/16/2014 04:10     Performed at [facility]   Culture       Value:        BLOOD CULTURE RECEIVED NO GROWTH TO DATE CULTURE WILL BE HELD FOR 5 DAYS BEFORE ISSUING A FINAL NEGATIVE REPORT     Performed at [facility]   Report Status PENDING    GLUCOSE, CAPILLARY     Status: Abnormal   Collection Time    08/16/14 12:44 AM      Result Value Ref Range   Glucose-Capillary 115 (*) 70 - 99 mg/dL   Comment 1 Documented in Chart     Comment 2 Notify RN    GLUCOSE, CAPILLARY     Status: Abnormal   Collection Time    08/16/14  1:51 AM      Result Value Ref Range   Glucose-Capillary 111 (*) 70 - 99 mg/dL  GLUCOSE, CAPILLARY     Status: Abnormal   Collection Time    08/16/14  4:50 AM      Result Value Ref Range   Glucose-Capillary 175 (*) 70 - 99 mg/dL   Comment 1 Documented in Chart     Comment 2 Notify RN    URINE CULTURE     Status: None   Collection Time    08/16/14  5:15 AM      Result Value Ref Range   Specimen Description URINE, CLEAN CATCH     Special Requests NONE     Culture  Setup Time       Value: 08/16/2014 09:00     Performed at [facility]       Value: NO GROWTH     Performed at [facility]   Culture       Value: NO GROWTH     Performed at [facility]   Report Status 08/17/2014 FINAL    URINALYSIS, ROUTINE W REFLEX MICROSCOPIC     Status: None   Collection Time    08/16/14  5:15 AM      Result Value Ref Range   Color, Urine YELLOW  YELLOW   APPearance CLEAR  CLEAR   Specific Gravity, Urine 1.016  1.005 - 1.030   pH 5.5  5.0 - 8.0   Glucose, UA NEGATIVE  NEGATIVE mg/dL   Hgb urine dipstick NEGATIVE  NEGATIVE   Bilirubin Urine NEGATIVE  NEGATIVE   Ketones, ur NEGATIVE  NEGATIVE mg/dL   Protein, ur NEGATIVE  NEGATIVE mg/dL    Urobilinogen, UA 0.2  0.0 - 1.0 mg/dL   Nitrite NEGATIVE  NEGATIVE   Leukocytes, UA NEGATIVE  NEGATIVE   Comment: MICROSCOPIC NOT DONE ON URINES WITH NEGATIVE PROTEIN, BLOOD, LEUKOCYTES, NITRITE, OR GLUCOSE <1000 mg/dL.  LEGIONELLA ANTIGEN, URINE     Status: None   Collection Time    08/16/14  5:15 AM      Result Value Ref Range  Specimen Description URINE, CLEAN CATCH     Special Requests NONE     Legionella Antigen, Urine       Value: Negative for Legionella pneumophila serogroup 1                                                              Legionella pneumophila serogroup 1 antigen can be detected in urine within 2 to 3 days of infection and may persist even after treatment. This      assay does not detect other Legionella species or serogroups.     Performed at [facility]   Report Status 08/16/2014 FINAL    STREP PNEUMONIAE URINARY ANTIGEN     Status: None   Collection Time    08/16/14  5:15 AM      Result Value Ref Range   Strep Pneumo Urinary Antigen NEGATIVE  NEGATIVE   Comment:            Infection due to S. pneumoniae     cannot be absolutely ruled out     since the antigen present     may be below the detection limit     of the test.  COMPREHENSIVE METABOLIC PANEL     Status: Abnormal   Collection Time    08/16/14  6:29 AM      Result Value Ref Range   Sodium 143  137 - 147 mEq/L   Potassium 4.2  3.7 - 5.3 mEq/L   Chloride 103  96 - 112 mEq/L   CO2 27  19 - 32 mEq/L   Glucose, Bld 192 (*) 70 - 99 mg/dL   BUN 22  6 - 23 mg/dL   Creatinine, Ser 0.95  0.50 - 1.10 mg/dL   Calcium 8.5  8.4 - 10.5 mg/dL   Total Protein 6.0  6.0 - 8.3 g/dL   Albumin 3.2 (*) 3.5 - 5.2 g/dL   AST 23  0 - 37 U/L   Comment: HEMOLYSIS AT THIS LEVEL MAY AFFECT RESULT   ALT 11  0 - 35 U/L   Alkaline Phosphatase 47  39 - 117 U/L   Total Bilirubin 0.4  0.3 - 1.2 mg/dL   GFR calc non Af Amer 53 (*) >90 mL/min   GFR calc Af Amer 62 (*) >90 mL/min   Comment: (NOTE)     The eGFR has  been calculated using the CKD EPI equation.     This calculation has not been validated in all clinical situations.     eGFR's persistently <90 mL/min signify possible Chronic Kidney     Disease.   Anion gap 13  5 - 15  CBC WITH DIFFERENTIAL     Status: Abnormal   Collection Time    08/16/14  6:29 AM      Result Value Ref Range   WBC 7.5  4.0 - 10.5 K/uL   RBC 3.05 (*) 3.87 - 5.11 MIL/uL   Hemoglobin 10.0 (*) 12.0 - 15.0 g/dL   HCT 31.8 (*) 36.0 - 46.0 %   MCV 104.3 (*) 78.0 - 100.0 fL   MCH 32.8  26.0 - 34.0 pg   MCHC 31.4  30.0 - 36.0 g/dL   RDW 17.1 (*) 11.5 - 15.5 %   Platelets 241  150 -  400 K/uL   Neutrophils Relative % 87 (*) 43 - 77 %   Neutro Abs 6.5  1.7 - 7.7 K/uL   Lymphocytes Relative 11 (*) 12 - 46 %   Lymphs Abs 0.8  0.7 - 4.0 K/uL   Monocytes Relative 2 (*) 3 - 12 %   Monocytes Absolute 0.1  0.1 - 1.0 K/uL   Eosinophils Relative 0  0 - 5 %   Eosinophils Absolute 0.0  0.0 - 0.7 K/uL   Basophils Relative 0  0 - 1 %   Basophils Absolute 0.0  0.0 - 0.1 K/uL  TROPONIN I     Status: None   Collection Time    08/16/14  6:29 AM      Result Value Ref Range   Troponin I <0.30  <0.30 ng/mL   Comment:            Due to the release kinetics of cTnI,     a negative result within the first hours     of the onset of symptoms does not rule out     myocardial infarction with certainty.     If myocardial infarction is still suspected,     repeat the test at appropriate intervals.  TROPONIN I     Status: None   Collection Time    08/16/14  9:55 AM      Result Value Ref Range   Troponin I <0.30  <0.30 ng/mL   Comment:            Due to the release kinetics of cTnI,     a negative result within the first hours     of the onset of symptoms does not rule out     myocardial infarction with certainty.     If myocardial infarction is still suspected,     repeat the test at appropriate intervals.  GLUCOSE, CAPILLARY     Status: Abnormal   Collection Time    08/16/14 10:48 AM       Result Value Ref Range   Glucose-Capillary 205 (*) 70 - 99 mg/dL   Comment 1 Notify RN    GLUCOSE, CAPILLARY     Status: Abnormal   Collection Time    08/17/14 12:12 AM      Result Value Ref Range   Glucose-Capillary 231 (*) 70 - 99 mg/dL  COMPREHENSIVE METABOLIC PANEL     Status: Abnormal   Collection Time    08/17/14  5:49 AM      Result Value Ref Range   Sodium 140  137 - 147 mEq/L   Potassium 4.7  3.7 - 5.3 mEq/L   Comment: HEMOLYSIS AT THIS LEVEL MAY AFFECT RESULT   Chloride 104  96 - 112 mEq/L   CO2 26  19 - 32 mEq/L   Glucose, Bld 147 (*) 70 - 99 mg/dL   BUN 23  6 - 23 mg/dL   Creatinine, Ser 0.93  0.50 - 1.10 mg/dL   Calcium 8.7  8.4 - 10.5 mg/dL   Total Protein 5.6 (*) 6.0 - 8.3 g/dL   Albumin 2.9 (*) 3.5 - 5.2 g/dL   AST 24  0 - 37 U/L   Comment: HEMOLYSIS AT THIS LEVEL MAY AFFECT RESULT   ALT 11  0 - 35 U/L   Comment: HEMOLYSIS AT THIS LEVEL MAY AFFECT RESULT   Alkaline Phosphatase 45  39 - 117 U/L   Total Bilirubin 0.4  0.3 - 1.2 mg/dL   GFR  calc non Af Amer 54 (*) >90 mL/min   GFR calc Af Amer 63 (*) >90 mL/min   Comment: (NOTE)     The eGFR has been calculated using the CKD EPI equation.     This calculation has not been validated in all clinical situations.     eGFR's persistently <90 mL/min signify possible Chronic Kidney     Disease.   Anion gap 10  5 - 15  CBC WITH DIFFERENTIAL     Status: Abnormal   Collection Time    08/17/14  5:49 AM      Result Value Ref Range   WBC 12.2 (*) 4.0 - 10.5 K/uL   Comment: REPEATED TO VERIFY     WHITE COUNT CONFIRMED ON SMEAR   RBC 2.84 (*) 3.87 - 5.11 MIL/uL   Hemoglobin 9.6 (*) 12.0 - 15.0 g/dL   Comment: REPEATED TO VERIFY     CONSISTENT WITH PREVIOUS RESULT   HCT 29.7 (*) 36.0 - 46.0 %   MCV 104.6 (*) 78.0 - 100.0 fL   Comment: CONSISTENT WITH PREVIOUS RESULT   MCH 33.8  26.0 - 34.0 pg   MCHC 32.3  30.0 - 36.0 g/dL   RDW 16.4 (*) 11.5 - 15.5 %   Platelets 299  150 - 400 K/uL   Neutrophils Relative % 76   43 - 77 %   Lymphocytes Relative 15  12 - 46 %   Monocytes Relative 9  3 - 12 %   Eosinophils Relative 0  0 - 5 %   Basophils Relative 0  0 - 1 %   Neutro Abs 9.3 (*) 1.7 - 7.7 K/uL   Lymphs Abs 1.8  0.7 - 4.0 K/uL   Monocytes Absolute 1.1 (*) 0.1 - 1.0 K/uL   Eosinophils Absolute 0.0  0.0 - 0.7 K/uL   Basophils Absolute 0.0  0.0 - 0.1 K/uL   RBC Morphology ELLIPTOCYTES     Comment: TEARDROP CELLS  GLUCOSE, CAPILLARY     Status: Abnormal   Collection Time    08/17/14  6:02 AM      Result Value Ref Range   Glucose-Capillary 144 (*) 70 - 99 mg/dL  GLUCOSE, CAPILLARY     Status: Abnormal   Collection Time    08/17/14 11:23 AM      Result Value Ref Range   Glucose-Capillary 181 (*) 70 - 99 mg/dL   Comment 1 Notify RN      Physical Findings: AIMS:  , ,  ,  ,    CIWA:    COWS:     Treatment Plan Summary: Daily contact with patient to assess and evaluate symptoms and progress in treatment Medication management  Plan: 1. Check Valproic acid level in am and adjust as needed 2. Start Klonopin 0.5 mg PO BID and discontinue Xanax 3. Discontinue Neurontin and Zoloft - not helpful 4. Continue Zyprexa 5 mg PO BID as she has contraindication with risperidone compound 5. Monitor for adverse effects 6. She will benefit from SNF upon medically stable  Medical Decision Making Problem Points:  Established problem, worsening (2), New problem, with no additional work-up planned (3), Review of last therapy session (1) and Review of psycho-social stressors (1) Data Points:  Review or order clinical lab tests (1) Review and summation of old records (2) Review of medication regiment & side effects (2) Review of new medications or change in dosage (2)  I certify that inpatient services furnished can reasonably be expected to improve  the patient's condition.   Phat Dalton,JANARDHAHA R. 08/17/2014, 2:19 PM
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
## 2014-08-17 NOTE — Progress Notes (Signed)
Pt. Refusing all night medications. On call NP, K. Schorr made aware. RN will continue to monitor pt. Paula [name], Paula [name]

## 2014-08-18 DIAGNOSIS — F0391 Unspecified dementia with behavioral disturbance: Secondary | ICD-10-CM

## 2014-08-18 DIAGNOSIS — F419 Anxiety disorder, unspecified: Secondary | ICD-10-CM

## 2014-08-18 DIAGNOSIS — I5031 Acute diastolic (congestive) heart failure: Secondary | ICD-10-CM

## 2014-08-18 LAB — VALPROIC ACID LEVEL: Valproic Acid Lvl: <10 ug/mL — ABNORMAL LOW (ref 50.0–100.0)

## 2014-08-18 LAB — BASIC METABOLIC PANEL
Anion gap: 10 (ref 5–15)
BUN: 24 mg/dL — AB (ref 6–23)
CALCIUM: 8.8 mg/dL (ref 8.4–10.5)
CO2: 28 mEq/L (ref 19–32)
Chloride: 103 mEq/L (ref 96–112)
Creatinine, Ser: 0.98 mg/dL (ref 0.50–1.10)
GFR calc Af Amer: 59 mL/min — ABNORMAL LOW (ref >90)
GFR, EST NON AFRICAN AMERICAN: 51 mL/min — AB (ref >90)
GLUCOSE: 101 mg/dL — AB (ref 70–99)
Potassium: 3.6 mEq/L — ABNORMAL LOW (ref 3.7–5.3)
Sodium: 141 mEq/L (ref 137–147)

## 2014-08-18 LAB — VITAMIN D 1,25 DIHYDROXY
VITAMIN D3 1, 25 (OH): 72 pg/mL
Vitamin D 1, 25 (OH)2 Total: 72 pg/mL (ref 18–72)

## 2014-08-18 LAB — GLUCOSE, CAPILLARY
GLUCOSE-CAPILLARY: 112 mg/dL — AB (ref 70–99)
Glucose-Capillary: 243 mg/dL — ABNORMAL HIGH (ref 70–99)

## 2014-08-18 MED ORDER — IPRATROPIUM-ALBUTEROL 0.5-2.5 (3) MG/3ML IN SOLN: 3.0000 mL | Freq: Three times a day (TID) | RESPIRATORY_TRACT | Status: DC

## 2014-08-18 MED ORDER — HALOPERIDOL 0.5 MG PO TABS: 0.5000 mg | ORAL_TABLET | Freq: Three times a day (TID) | ORAL | Status: DC | PRN

## 2014-08-18 MED ORDER — CLONAZEPAM 0.5 MG PO TABS: 0.5000 mg | ORAL_TABLET | Freq: Two times a day (BID) | ORAL | Status: DC

## 2014-08-18 MED ORDER — FUROSEMIDE 20 MG PO TABS: 40.0000 mg | ORAL_TABLET | Freq: Every day | ORAL | Status: DC

## 2014-08-18 MED ORDER — DIVALPROEX SODIUM 125 MG PO DR TAB: 250.0000 mg | DELAYED_RELEASE_TABLET | Freq: Three times a day (TID) | ORAL | Status: DC

## 2014-08-18 MED ORDER — LOSARTAN POTASSIUM 50 MG PO TABS: 50.0000 mg | ORAL_TABLET | Freq: Every day | ORAL | Status: DC

## 2014-08-18 MED ORDER — DIVALPROEX SODIUM 250 MG PO DR TAB
250.0000 mg | DELAYED_RELEASE_TABLET | Freq: Three times a day (TID) | ORAL | Status: DC
  Filled 2014-08-18 (×2): qty 1

## 2014-08-18 MED ORDER — DOXYCYCLINE HYCLATE 100 MG PO TABS: ORAL_TABLET | ORAL | Status: DC

## 2014-08-18 MED ORDER — PREDNISONE 20 MG PO TABS: ORAL_TABLET | ORAL | Status: DC

## 2014-08-18 NOTE — Discharge Summary (Signed)
Physician Discharge Summary  Paula [name] WUJ:[id number] DOB: 1927/08/27 DOA: 78/03/2014  PCP: [name], MD  Admit date: 08/15/2014 Discharge date: 08/18/2014  Time spent: >30 minutes  Recommendations for Outpatient Follow-up:  Palliative to follow patient a SNF Check BMET in 1 week to follow electrolytes and renal function  Discharge Diagnoses:  Acute on chronic diastolic heart failure, NYHA class 2 Acute respiratory failure due to COPD and CHF exacerbation COPD exacerbation and bronchiectasis Dementia with behavior disorder  HTN Hypothyroidism Deconditioning   Discharge Condition: stable. No CP. Breathing easier and comfortable.  Diet recommendation: low sodium diet  Filed Weights   08/15/14 2145 08/17/14 0606 08/18/14 0500  Weight: 82.6 kg (182 lb 1.6 oz) 81.9 kg (180 lb 8.9 oz) 80.6 kg (177 lb 11.1 oz)    History of present illness:  80 y.o. year old female with significant past medical history of end stage dementia, TIA, type 2 DM, chronic diastolic CHF, chronic asthma/COPD presenting with acute resp failure with hypoxia, fall, weakness, LE swelling. Per the husband, pt was recently admitted to SNF over past 2-3 weeks. Was discharged home 3-4 days ago.Husband states that he felt very uncomfotable about pt coming home. Since coming home, pt has had worsening SOB, LE swelling and weakness. Husband states that pt slid down the side of the bed the day prior to admission, where she was unable to move for approx 5 hours; at that time patient was transferred to [facility] for further evaluation.   Hospital Course:  Acute on chronic respiratory failure: possibly multifactorial Acute diastolic heart failure, NYHA class 2 and COPD:  - Lowest weight in the system is 77 kg. Her admission weight is 82 kg now down to 80kg at discharge - continue PO lasix and monitor electrolytes and renal function  -continue quick steroid tapering, antibiotics and inhalers  -per goals of care  discussion; patient is now DNR and will require permanent placement  -palliative care will follow at the facility  Leukocytosis:  -Now improved  -will follow trend  -no fever -continue doxycycline tx for bronchiectasis  Advance Dementia With superimpose Encephalopathy and behavioral disturbances -per psych recommendations will start klonopin, increase dose of depakote; will continue zyprexa and trazodone  -will stop neurontin and xanax, -PRN haldol -further adjustments base on mood swings and response  Hypertension  -continue home dose losartan and Lasix.  -low sodium diet  Hypothyroidism  -Continue Synthroid  Physical deconditioning: will discharge to SNF   Procedures: See below for x-ray reports   Consultations:  Psychiatry  Palliative Care  Discharge Exam: Filed Vitals:   08/18/14 1327  BP: 122/51  Pulse: 92  Temp: 100.6 F (38.1 C)  Resp: 22   General: Alert, awake, oriented x1, in no acute distress; denies CP or SOB.  HEENT: No bruits, no goiter. -JVD  Heart: Regular rate and rhythm, no rubs or gallops  Lungs: Good air movement, scattered rhonchi  Abdomen: Soft, nontender, nondistended, positive bowel sounds.  Neuro: Grossly intact, nonfocal.   Discharge Instructions You were cared for by a hospitalist during your hospital stay. If you have any questions about your discharge medications or the care you received while you were in the hospital after you are discharged, you can call the unit and asked to speak with the hospitalist on call if the hospitalist that took care of you is not available. Once you are discharged, your primary care physician will handle any further medical issues. Please note that NO REFILLS for any discharge medications  will be authorized once you are discharged, as it is imperative that you return to your primary care physician (or establish a relationship with a primary care physician if you do not have one) for your aftercare needs so  that they can reassess your need for medications and monitor your lab values.  Discharge Instructions   Diet - low sodium heart healthy    Complete by:  As directed      Discharge instructions    Complete by:  As directed   Maintain good hydration Take medications as prescribed BMET in 1 week to follow electrolytes and renal function Palliative care to follow patient at SNF Check weight on daily basis          Current Discharge Medication List    START taking these medications   Details  clonazePAM (KLONOPIN) 0.5 MG tablet Take 1 tablet (0.5 mg total) by mouth 2 (two) times daily. Qty: 30 tablet, Refills: 0    doxycycline (VIBRA-TABS) 100 MG tablet Take 1 tablet by mouth twice a day for 5 days Refills: 0    ipratropium-albuterol (DUONEB) 0.5-2.5 (3) MG/3ML SOLN Take 3 mLs by nebulization 3 (three) times daily.    losartan (COZAAR) 50 MG tablet Take 1 tablet (50 mg total) by mouth daily.    predniSONE (DELTASONE) 20 MG tablet Take 2 tablets by mouth X 1 day; then 2 tablets by mouth X 2 days; then 1 tablet by mouth X 2 days and then 1/2 tablet by mouth X 2 days. Stop prednisone after tast dose Refills: 0      CONTINUE these medications which have CHANGED   Details  divalproex (DEPAKOTE) 125 MG DR tablet Take 2 tablets (250 mg total) by mouth 3 (three) times daily with meals.    furosemide (LASIX) 20 MG tablet Take 2 tablets (40 mg total) by mouth daily.      CONTINUE these medications which have NOT CHANGED   Details  acetaminophen (TYLENOL) 500 MG tablet Take 500 mg by mouth every 6 (six) hours as needed (pain).    albuterol (PROVENTIL HFA;VENTOLIN HFA) 108 (90 BASE) MCG/ACT inhaler Inhale 2 puffs into the lungs every 6 (six) hours as needed for wheezing or shortness of breath. Qty: 3 Inhaler, Refills: 0    aspirin 325 MG tablet Take 325 mg by mouth daily.    baclofen (LIORESAL) 10 MG tablet Take 1 tablet (10 mg total) by mouth 4 (four) times daily as needed for muscle  spasms. Qty: 120 each, Refills: 6    benzonatate (TESSALON) 100 MG capsule Take 2 capsules (200 mg total) by mouth at bedtime as needed for cough. Qty: 30 capsule, Refills: 1    Calcium Carbonate-Vitamin D (CALCARB 600/D) 600-400 MG-UNIT per tablet Take 1 tablet by mouth every other day.     carvedilol (COREG) 6.25 MG tablet Take 6.25 mg by mouth 2 (two) times daily with a meal.    Cholecalciferol (VITAMIN D3) 5000 UNITS TABS Take 5,000 Units by mouth every other day.     cyanocobalamin 500 MCG tablet Take 500 mcg by mouth daily.    fexofenadine (ALLEGRA) 180 MG tablet Take 1 tablet (180 mg total) by mouth daily. Qty: 30 tablet, Refills: 3    fluticasone (FLONASE) 50 MCG/ACT nasal spray Place 1 spray into both nostrils daily. Qty: 16 g, Refills: 3    levothyroxine (SYNTHROID, LEVOTHROID) 175 MCG tablet Take 87.5-175 mcg by mouth daily before breakfast. Take 1 tab ( ) on Sunday, Monday, Wednesday, Friday,  and Saturday. Take 0.5 tab (87.1075mcg) on Tuesday and Thursday.    metFORMIN (GLUCOPHAGE-XR) 500 MG 24 hr tablet Take 250 mg by mouth at bedtime.    OLANZapine (ZYPREXA) 5 MG tablet Take 5 mg by mouth 2 (two) times daily.    Omega-3 Fatty Acids (FISH OIL) 1000 MG CAPS Take 1,000 mg by mouth daily.    pantoprazole (PROTONIX) 40 MG tablet Take 40 mg by mouth every other day.     Polyethyl Glycol-Propyl Glycol (SYSTANE) 0.4-0.3 % SOLN Apply 1 drop to eye 2 (two) times daily as needed (dry eyes).    potassium chloride SA (K-DUR,KLOR-CON) 20 MEQ tablet Take 20 mEq by mouth daily.    rivastigmine (EXELON) 4.6 mg/24hr Place 4.6 mg onto the skin every morning.    traZODone (DESYREL) 150 MG tablet Take 150 mg by mouth at bedtime.      STOP taking these medications     ALPRAZolam (XANAX) 0.5 MG tablet      gabapentin (NEURONTIN) 100 MG capsule      losartan-hydrochlorothiazide (HYZAAR) 50-12.5 MG per tablet      sertraline (ZOLOFT) 50 MG tablet        Allergies  Allergen  Reactions  . Bactrim [Sulfamethoxazole-Tmp Ds] Anaphylaxis  . Contrast Media [Iodinated Diagnostic Agents] Anaphylaxis and Swelling  . Iodine Anaphylaxis  . Iohexol Anaphylaxis, Shortness Of Breath and Swelling     Desc: PT STATES SHE HAD A SEVERE REACTION TO IV CONRAST WITH THROAT SWELLING AND SOB. SHE WAS ADMITTED TO THE HOSPITAL. SHE HAS NEVER HAD CONTRAST AGAIN.   Marland Kitchen. Sulfa Antibiotics Anaphylaxis  . Aricept [Donepezil Hcl] Nausea And Vomiting  . Cymbalta [Duloxetine Hcl] Other (See Comments)    HALLUCINATIONS    . Delsym [Dextromethorphan] Other (See Comments)    Hallucination  . Guaifenesin & Derivatives Other (See Comments)    Hallucination  . Hydromet [Hydrocodone-Homatropine] Other (See Comments)    hallucinations  . Avelox [Moxifloxacin Hcl In Nacl] Rash  . Ciprofloxacin Itching  . Gadolinium Derivatives Other (See Comments)    Unknown    . Penicillins Rash    Rash as a child  . Quinolones Rash   Follow-up Information   Follow up with Eustaquio BoydenJavier Gutierrez, MD. Schedule an appointment as soon as possible for a visit in 1 week.   Specialty:  Family Medicine   Contact information:   [address] [place] [address] [phone]        The results of significant diagnostics from this hospitalization (including imaging, microbiology, ancillary and laboratory) are listed below for reference.    Significant Diagnostic Studies: Dg Chest 2 View  08/15/2014   CLINICAL DATA:  Status post fall last night ; mental status change; history of COPD, CHF, diabetes, and leg swelling  EXAM: CHEST  2 VIEW  COMPARISON:  Portable chest x-ray of July 09, 2014  FINDINGS: The lungs remain mildly hyperinflated. The interstitial markings are mildly increased overall. A small amount of pleural fluid blunts the lateral and posterior costophrenic angles on the left. The cardiac silhouette is enlarged. The pulmonary vascularity is mildly engorged. The bony thorax is unremarkable. The  observed portions of the bony thorax exhibit no acute abnormalities.  IMPRESSION: Low-grade CHF superimposed upon COPD. There is a small left pleural effusion. There is no evidence of pneumonia.   Electronically Signed   By: David  [place]   On: 08/15/2014 18:42    Microbiology: Recent Results (from the past 240 hour(s))  CULTURE, BLOOD (  ROUTINE X 2)     Status: None   Collection Time    08/15/14 11:24 PM      Result Value Ref Range Status   Specimen Description BLOOD RIGHT ARM   Final   Special Requests BOTTLES DRAWN AEROBIC ONLY 5CC   Final   Culture  Setup Time     Final   Value: 08/16/2014 04:11     Performed at [facility]   Culture     Final   Value:        BLOOD CULTURE RECEIVED NO GROWTH TO DATE CULTURE WILL BE HELD FOR 5 DAYS BEFORE ISSUING A FINAL NEGATIVE REPORT     Performed at [facility]   Report Status PENDING   Incomplete  CULTURE, BLOOD (ROUTINE X 2)     Status: None   Collection Time    08/15/14 11:36 PM      Result Value Ref Range Status   Specimen Description BLOOD LEFT HAND   Final   Special Requests BOTTLES DRAWN AEROBIC ONLY 8CC   Final   Culture  Setup Time     Final   Value: 08/16/2014 04:10     Performed at [facility]   Culture     Final   Value:        BLOOD CULTURE RECEIVED NO GROWTH TO DATE CULTURE WILL BE HELD FOR 5 DAYS BEFORE ISSUING A FINAL NEGATIVE REPORT     Performed at [facility]   Report Status PENDING   Incomplete  URINE CULTURE     Status: None   Collection Time    08/16/14  5:15 AM      Result Value Ref Range Status   Specimen Description URINE, CLEAN CATCH   Final   Special Requests NONE   Final   Culture  Setup Time     Final   Value: 08/16/2014 09:00     Performed at [facility] Count     Final   Value: NO GROWTH     Performed at [facility]   Culture     Final   Value: NO GROWTH     Performed at [facility]   Report Status 08/17/2014 FINAL   Final      Labs: Basic Metabolic Panel:  Recent Labs Lab 08/15/14 1735 08/16/14 0629 08/17/14 0549 08/18/14 0520  NA 145 143 140 141  K 4.7 4.2 4.7 3.6*  CL 105 103 104 103  CO2 29 27 26 28   GLUCOSE 132* 192* 147* 101*  BUN 21 22 23  24*  CREATININE 1.09 0.95 0.93 0.98  CALCIUM 8.6 8.5 8.7 8.8   Liver Function Tests:  Recent Labs Lab 08/15/14 1735 08/16/14 0629 08/17/14 0549  AST 27 23 24   ALT 12 11 11   ALKPHOS 64 47 45  BILITOT 0.3 0.4 0.4  PROT 6.4 6.0 5.6*  ALBUMIN 3.3* 3.2* 2.9*    Recent Labs Lab 08/15/14 2324  AMMONIA 31   CBC:  Recent Labs Lab 08/15/14 1735 08/16/14 0629 08/17/14 0549  WBC 11.4* 7.5 12.2*  NEUTROABS 6.5 6.5 9.3*  HGB 10.2* 10.0* 9.6*  HCT 32.9* 31.8* 29.7*  MCV 107.5* 104.3* 104.6*  PLT 268 241 299   Cardiac Enzymes:  Recent Labs Lab 08/15/14 2324 08/16/14 0629 08/16/14 0955  CKTOTAL 90  --   --   CKMB 2.8  --   --   TROPONINI <0.30 <0.30 <0.30   BNP:  BNP (last 3 results)  Recent Labs  03/22/14 1054 08/15/14 1735  PROBNP 183.0* 171.4   CBG:  Recent Labs Lab 08/17/14 1123 08/17/14 1615 08/17/14 2113 08/18/14 0626 08/18/14 1134  GLUCAP 181* 211* 117* 112* 243*    Signed:  Vassie Loll  [facility] 08/18/2014, 1:31 PM
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
## 2014-08-18 NOTE — Progress Notes (Signed)
Patient ON:GEXBM:Paula [name]      DOB: [birth year]      WUX:[id number]   Palliative Medicine Team at [facility] Progress Note    Subjective:  Paula PikesSusan reports she is fine. Spouse is at the bedside. Plan for transition to SNF .  Psych adjusting  meds.  Filed Vitals:   08/18/14 1327  BP: 122/51  Pulse: 92  Temp: 100.6 F (38.1 C)  Resp: 22   Physical exam:   General: no acute distress, pleasantly confused PERRL, EOMI, anicteric, mmm Chest: decreased, but clear CVS: regular, S1, S2 ABd: obese, soft, not tender or distended Ext: warm, trace non biting edema Neuro: pleasantly confused with hypomanic features  Assessment and plan: 80 yr old white female with advanced dementia with aggressive behavior.  Patient being treated for CHF. Low grade temp noted   1.  DNR established  2.  Dementia with behavior.  Continue with medication  3.  Spouse needs to work through not being able to care for patient at home. I suspect  He will continue to return her to the hospital to treat the treatable issues.  We would be glad to be present for future goals of care. I believe she will be at high risk for return to hospital.   Kenzley Ke L. Ladona Ridgelaylor, MD MBA The Palliative Medicine Team at [facility] Team Phone: [phone] Pager: [phone] ( Use team phone after hours)

## 2014-08-18 NOTE — Clinical Social Work Placement (Signed)
     Clinical Social Work Department CLINICAL SOCIAL WORK PLACEMENT NOTE 08/18/2014  Patient:  Paula [name], Paula [name]  Account Number:  [id number] Admit date:  08/15/2014  Clinical Social Worker:  Butch Penny Byrd Rushlow, LCSW  Date/time:  08/17/2014 11:45 AM  Clinical Social Work is seeking post-discharge placement for this patient at the following level of care:   SKILLED NURSING   (*CSW will update this form in Epic as items are completed)   08/17/2014  Patient/family provided with [employer] Department of Clinical Social Works list of facilities offering this level of care within the geographic area requested by the patient (or if unable, by the patients family).  08/17/2014  Patient/family informed of their freedom to choose among providers that offer the needed level of care, that participate in Medicare, Medicaid or managed care program needed by the patient, have an available bed and are willing to accept the patient.  08/17/2014  Patient/family informed of [facility] ownership interest in [facility], as well as of the fact that they are under no obligation to receive care at this facility.  PASARR submitted to EDS on  PASARR number received on   FL2 transmitted to all facilities in geographic area requested by pt/family on  08/17/2014 FL2 transmitted to all facilities within larger geographic area on   Patient informed that his/her managed care company has contracts with or will negotiate with  certain facilities, including the following:   NA  Patient has prior PASARR     Patient/family informed of bed offers received:  08/17/2014 Patient chooses bed at [facility] Physician recommends and patient chooses bed at    Patient to be transferred to [facility] on  08/18/2014 Patient to be transferred to facility by Ambulance Corey Harold) Patient and family notified of transfer on 08/18/2014 Name of family member notified:  Husband: Audrie Lia  The  following physician request were entered in Epic: Physician Request  Please sign FL2.  Please prepare priority discharge summary and prescriptions.    Additional Comments: 08/18/14  OK per MD for d/c today to SNF.  [name] is alert but quite confused; she was deemed to not have capacity per Psychiatrist.  CSW met with patient's husband who is agreeable to d/c. Nursing notified to call report. CSW signing off.  Lorie Phenix. [place], [place]

## 2014-08-18 NOTE — Progress Notes (Signed)
Patient ID: Paula [name], female   DOB: 1927-07-03, 80 y.o.   MRN: [id number] [facility] MD Progress Note  08/18/2014 2:37 PM KARLISSA ARON  MRN:  [id number]  Subjective:  Patient is seen for psych consultation follow up and spoke with Dr. Dyann Kief and staff RN and patient husband; Reviewed patient valproic acid level which are not therapeutic and will adjust as clinically required. Dr. Dyann Kief stated that she is better since her last medication changes and no reported agitation or aggression but continues to have paranoid delusions and ongoing anxiety. Patient has severe memory deficits, paranoia, poor insight, and judgment. She is not able to orient to place, persons, time or situation. She know her name and year she was born, and identify her husband. She is legally blind and has paranoid thoughts about her husband, saying he is making up her problems and he does not want her to do well.  Diagnosis:   DSM5: Schizophrenia Disorders:  Delusional Disorder (297.1) Obsessive-Compulsive Disorders:   Depressive Disorders:  Major Depressive Disorder - Unspecified (296.20) Total Time spent with patient: 30 minutes  Axis I: Dementia with psychosis - paranoid delusion  ADL's:  Impaired  Sleep: Fair  Appetite:  Fair  Suicidal Ideation:  denied Homicidal Ideation:  denied AEB (as evidenced by):  Psychiatric Specialty Exam: Physical Exam  ROS  Blood pressure 122/51, pulse 92, temperature 100.6 F (38.1 C), temperature source Oral, resp. rate 22, height 5' 5"  (1.651 m), weight 80.6 kg (177 lb 11.1 oz), SpO2 92.00%.Body mass index is 29.57 kg/(m^2).  General Appearance: Bizarre and Guarded  Eye Contact::  Fair  Speech:  Clear and Coherent  Volume:  Normal  Mood:  Anxious  Affect:  Depressed  Thought Process:  Disorganized, Irrelevant and Tangential  Orientation:  Other:  she is oriented to her name and recognise husband and brother in law.  Thought Content:  Delusions and Rumination  Suicidal  Thoughts:  No  Homicidal Thoughts:  No  Memory:  Immediate;   Poor Recent;   Poor  Judgement:  Poor  Insight:  Shallow  Psychomotor Activity:  Decreased  Concentration:  Fair  Recall:  Poor  Fund of Knowledge:Poor  Language: Fair  Akathisia:  NA  Handed:  Right  AIMS (if indicated):     Assets:  Desire for Improvement Financial Resources/Insurance Housing Leisure Time Resilience Social Support  Sleep:      Musculoskeletal: Strength & Muscle Tone: decreased Gait & Station: unable to stand Patient leans: N/A  Current Medications: Current Facility-Administered Medications  Medication Dose Route Frequency Provider Last Rate Last Dose  . 0.9 %  sodium chloride infusion  250 mL Intravenous PRN Shanda Howells, MD      . acetaminophen (TYLENOL) tablet 500 mg  500 mg Oral Q6H PRN Charlynne Cousins, MD   500 mg at 08/18/14 1342  . aspirin tablet 325 mg  325 mg Oral Daily Shanda Howells, MD   325 mg at 08/18/14 1051  . carvedilol (COREG) tablet 6.25 mg  6.25 mg Oral BID WC Charlynne Cousins, MD   6.25 mg at 08/18/14 1448  . clonazePAM (KLONOPIN) tablet 0.5 mg  0.5 mg Oral BID Durward Parcel, MD   0.5 mg at 08/18/14 1052  . divalproex (DEPAKOTE) DR tablet 125 mg  125 mg Oral TID WC Shanda Howells, MD   125 mg at 08/18/14 1211  . doxycycline (VIBRA-TABS) tablet 100 mg  100 mg Oral Q12H Charlynne Cousins, MD   100 mg  at 08/18/14 1051  . enoxaparin (LOVENOX) injection 40 mg  40 mg Subcutaneous Q24H Arman Bogus, RPH   40 mg at 08/18/14 1052  . furosemide (LASIX) tablet 40 mg  40 mg Oral Daily Barton Dubois, MD   40 mg at 08/18/14 1052  . haloperidol lactate (HALDOL) injection 2 mg  2 mg Intravenous Q6H PRN Charlynne Cousins, MD   2 mg at 08/16/14 2038  . insulin aspart (novoLOG) injection 0-9 Units  0-9 Units Subcutaneous TID WC Barton Dubois, MD   3 Units at 08/18/14 1211  . ipratropium-albuterol (DUONEB) 0.5-2.5 (3) MG/3ML nebulizer solution 3 mL  3 mL Nebulization  Q2H PRN Shanda Howells, MD      . ipratropium-albuterol (DUONEB) 0.5-2.5 (3) MG/3ML nebulizer solution 3 mL  3 mL Nebulization TID Shanda Howells, MD   3 mL at 08/18/14 1345  . levothyroxine (SYNTHROID, LEVOTHROID) tablet 175 mcg  175 mcg Oral Once per day on Sun Mon Wed Fri Sat Shanda Howells, MD   175 mcg at 08/17/14 [phone]  . levothyroxine (SYNTHROID, LEVOTHROID) tablet 87.5 mcg  87.5 mcg Oral Once per day on Tue Thu Shanda Howells, MD   87.5 mcg at 08/18/14 2025  . losartan (COZAAR) tablet 50 mg  50 mg Oral Daily Barton Dubois, MD   50 mg at 08/18/14 1052  . OLANZapine (ZYPREXA) tablet 5 mg  5 mg Oral BID Shanda Howells, MD   5 mg at 08/18/14 1051  . pantoprazole (PROTONIX) EC tablet 40 mg  40 mg Oral Jodean Lima, MD   40 mg at 08/17/14 1013  . polyvinyl alcohol (LIQUIFILM TEARS) 1.4 % ophthalmic solution 1 drop  1 drop Both Eyes BID PRN Shanda Howells, MD      . potassium chloride SA (K-DUR,KLOR-CON) CR tablet 20 mEq  20 mEq Oral Daily Shanda Howells, MD   20 mEq at 08/18/14 1051  . predniSONE (DELTASONE) tablet 40 mg  40 mg Oral Q breakfast Charlynne Cousins, MD   40 mg at 08/18/14 4270  . rivastigmine (EXELON) 4.6 mg/24hr 4.6 mg  4.6 mg Transdermal q morning - 10a Shanda Howells, MD   4.6 mg at 08/18/14 1000  . sodium chloride 0.9 % injection 3 mL  3 mL Intravenous Q12H Shanda Howells, MD   3 mL at 08/18/14 1054  . sodium chloride 0.9 % injection 3 mL  3 mL Intravenous Q12H Shanda Howells, MD   3 mL at 08/18/14 1053  . sodium chloride 0.9 % injection 3 mL  3 mL Intravenous PRN Shanda Howells, MD      . traZODone (DESYREL) tablet 150 mg  150 mg Oral QHS Shanda Howells, MD   150 mg at 08/17/14 2204    Lab Results:  Results for orders placed during the hospital encounter of 08/15/14 (from the past 48 hour(s))  GLUCOSE, CAPILLARY     Status: Abnormal   Collection Time    08/17/14 12:12 AM      Result Value Ref Range   Glucose-Capillary 231 (*) 70 - 99 mg/dL  COMPREHENSIVE METABOLIC PANEL      Status: Abnormal   Collection Time    08/17/14  5:49 AM      Result Value Ref Range   Sodium 140  137 - 147 mEq/L   Potassium 4.7  3.7 - 5.3 mEq/L   Comment: HEMOLYSIS AT THIS LEVEL MAY AFFECT RESULT   Chloride 104  96 - 112 mEq/L   CO2 26  19 - 32  mEq/L   Glucose, Bld 147 (*) 70 - 99 mg/dL   BUN 23  6 - 23 mg/dL   Creatinine, Ser 0.93  0.50 - 1.10 mg/dL   Calcium 8.7  8.4 - 10.5 mg/dL   Total Protein 5.6 (*) 6.0 - 8.3 g/dL   Albumin 2.9 (*) 3.5 - 5.2 g/dL   AST 24  0 - 37 U/L   Comment: HEMOLYSIS AT THIS LEVEL MAY AFFECT RESULT   ALT 11  0 - 35 U/L   Comment: HEMOLYSIS AT THIS LEVEL MAY AFFECT RESULT   Alkaline Phosphatase 45  39 - 117 U/L   Total Bilirubin 0.4  0.3 - 1.2 mg/dL   GFR calc non Af Amer 54 (*) >90 mL/min   GFR calc Af Amer 63 (*) >90 mL/min   Comment: (NOTE)     The eGFR has been calculated using the CKD EPI equation.     This calculation has not been validated in all clinical situations.     eGFR's persistently <90 mL/min signify possible Chronic Kidney     Disease.   Anion gap 10  5 - 15  CBC WITH DIFFERENTIAL     Status: Abnormal   Collection Time    08/17/14  5:49 AM      Result Value Ref Range   WBC 12.2 (*) 4.0 - 10.5 K/uL   Comment: REPEATED TO VERIFY     WHITE COUNT CONFIRMED ON SMEAR   RBC 2.84 (*) 3.87 - 5.11 MIL/uL   Hemoglobin 9.6 (*) 12.0 - 15.0 g/dL   Comment: REPEATED TO VERIFY     CONSISTENT WITH PREVIOUS RESULT   HCT 29.7 (*) 36.0 - 46.0 %   MCV 104.6 (*) 78.0 - 100.0 fL   Comment: CONSISTENT WITH PREVIOUS RESULT   MCH 33.8  26.0 - 34.0 pg   MCHC 32.3  30.0 - 36.0 g/dL   RDW 16.4 (*) 11.5 - 15.5 %   Platelets 299  150 - 400 K/uL   Neutrophils Relative % 76  43 - 77 %   Lymphocytes Relative 15  12 - 46 %   Monocytes Relative 9  3 - 12 %   Eosinophils Relative 0  0 - 5 %   Basophils Relative 0  0 - 1 %   Neutro Abs 9.3 (*) 1.7 - 7.7 K/uL   Lymphs Abs 1.8  0.7 - 4.0 K/uL   Monocytes Absolute 1.1 (*) 0.1 - 1.0 K/uL   Eosinophils  Absolute 0.0  0.0 - 0.7 K/uL   Basophils Absolute 0.0  0.0 - 0.1 K/uL   RBC Morphology ELLIPTOCYTES     Comment: TEARDROP CELLS  GLUCOSE, CAPILLARY     Status: Abnormal   Collection Time    08/17/14  6:02 AM      Result Value Ref Range   Glucose-Capillary 144 (*) 70 - 99 mg/dL  GLUCOSE, CAPILLARY     Status: Abnormal   Collection Time    08/17/14 11:23 AM      Result Value Ref Range   Glucose-Capillary 181 (*) 70 - 99 mg/dL   Comment 1 Notify RN    GLUCOSE, CAPILLARY     Status: Abnormal   Collection Time    08/17/14  4:15 PM      Result Value Ref Range   Glucose-Capillary 211 (*) 70 - 99 mg/dL   Comment 1 Notify RN    GLUCOSE, CAPILLARY     Status: Abnormal   Collection Time  08/17/14  9:13 PM      Result Value Ref Range   Glucose-Capillary 117 (*) 70 - 99 mg/dL   Comment 1 Notify RN    VALPROIC ACID LEVEL     Status: Abnormal   Collection Time    08/18/14  5:20 AM      Result Value Ref Range   Valproic Acid Lvl <10.0 (*) 50.0 - 100.0 ug/mL  BASIC METABOLIC PANEL     Status: Abnormal   Collection Time    08/18/14  5:20 AM      Result Value Ref Range   Sodium 141  137 - 147 mEq/L   Potassium 3.6 (*) 3.7 - 5.3 mEq/L   Chloride 103  96 - 112 mEq/L   CO2 28  19 - 32 mEq/L   Glucose, Bld 101 (*) 70 - 99 mg/dL   BUN 24 (*) 6 - 23 mg/dL   Creatinine, Ser 0.98  0.50 - 1.10 mg/dL   Calcium 8.8  8.4 - 10.5 mg/dL   GFR calc non Af Amer 51 (*) >90 mL/min   GFR calc Af Amer 59 (*) >90 mL/min   Comment: (NOTE)     The eGFR has been calculated using the CKD EPI equation.     This calculation has not been validated in all clinical situations.     eGFR's persistently <90 mL/min signify possible Chronic Kidney     Disease.   Anion gap 10  5 - 15  GLUCOSE, CAPILLARY     Status: Abnormal   Collection Time    08/18/14  6:26 AM      Result Value Ref Range   Glucose-Capillary 112 (*) 70 - 99 mg/dL  GLUCOSE, CAPILLARY     Status: Abnormal   Collection Time    08/18/14 11:34 AM       Result Value Ref Range   Glucose-Capillary 243 (*) 70 - 99 mg/dL   Comment 1 Notify RN      Physical Findings: AIMS:  , ,  ,  ,    CIWA:    COWS:     Treatment Plan Summary: Daily contact with patient to assess and evaluate symptoms and progress in treatment Medication management  Plan: 1. Valproic acid level is sub therapeutic at less than 10 at this time 2. Will increase to 250 mg PO TID for better control of symptoms and for therapeutic window and avoid toxic levels 3. Continue Klonopin 0.5 mg PO BID and discontinue Xanax 4. Continue Zyprexa 5 mg PO BID as she has contraindication with risperidone compound 5. Monitor for adverse effects 6. She will benefit from SNF upon medically stable  Medical Decision Making Problem Points:  Established problem, worsening (2), New problem, with no additional work-up planned (3), Review of last therapy session (1) and Review of psycho-social stressors (1) Data Points:  Review or order clinical lab tests (1) Review and summation of old records (2) Review of medication regiment & side effects (2) Review of new medications or change in dosage (2)  I certify that inpatient services furnished can reasonably be expected to improve the patient's condition.   Imir Brumbach,JANARDHAHA R. 08/18/2014, 2:37 PM

## 2014-08-21 ENCOUNTER — Encounter (HOSPITAL_COMMUNITY): Payer: Self-pay | Admitting: Emergency Medicine

## 2014-08-21 ENCOUNTER — Emergency Department (HOSPITAL_COMMUNITY)
Admission: EM | Admit: 2014-08-21 | Discharge: 2014-08-22 | Disposition: A | Discharge status: Skilled Nursing Facility | Payer: Medicare Other | Attending: Emergency Medicine | Admitting: Emergency Medicine

## 2014-08-21 DIAGNOSIS — Z8739 Personal history of other diseases of the musculoskeletal system and connective tissue: Secondary | ICD-10-CM | POA: Insufficient documentation

## 2014-08-21 DIAGNOSIS — I10 Essential (primary) hypertension: Secondary | ICD-10-CM | POA: Diagnosis not present

## 2014-08-21 DIAGNOSIS — N390 Urinary tract infection, site not specified: Secondary | ICD-10-CM | POA: Diagnosis not present

## 2014-08-21 DIAGNOSIS — X58XXXA Exposure to other specified factors, initial encounter: Secondary | ICD-10-CM | POA: Diagnosis not present

## 2014-08-21 DIAGNOSIS — Y9389 Activity, other specified: Secondary | ICD-10-CM | POA: Diagnosis not present

## 2014-08-21 DIAGNOSIS — Y9289 Other specified places as the place of occurrence of the external cause: Secondary | ICD-10-CM | POA: Insufficient documentation

## 2014-08-21 DIAGNOSIS — I509 Heart failure, unspecified: Secondary | ICD-10-CM | POA: Diagnosis not present

## 2014-08-21 DIAGNOSIS — S62609A Fracture of unspecified phalanx of unspecified finger, initial encounter for closed fracture: Secondary | ICD-10-CM

## 2014-08-21 DIAGNOSIS — K219 Gastro-esophageal reflux disease without esophagitis: Secondary | ICD-10-CM | POA: Insufficient documentation

## 2014-08-21 DIAGNOSIS — F329 Major depressive disorder, single episode, unspecified: Secondary | ICD-10-CM | POA: Insufficient documentation

## 2014-08-21 DIAGNOSIS — S6991XA Unspecified injury of right wrist, hand and finger(s), initial encounter: Secondary | ICD-10-CM | POA: Diagnosis present

## 2014-08-21 DIAGNOSIS — E119 Type 2 diabetes mellitus without complications: Secondary | ICD-10-CM | POA: Diagnosis not present

## 2014-08-21 DIAGNOSIS — Z792 Long term (current) use of antibiotics: Secondary | ICD-10-CM | POA: Diagnosis not present

## 2014-08-21 DIAGNOSIS — F419 Anxiety disorder, unspecified: Secondary | ICD-10-CM | POA: Insufficient documentation

## 2014-08-21 DIAGNOSIS — Z87891 Personal history of nicotine dependence: Secondary | ICD-10-CM | POA: Diagnosis not present

## 2014-08-21 DIAGNOSIS — E039 Hypothyroidism, unspecified: Secondary | ICD-10-CM | POA: Insufficient documentation

## 2014-08-21 DIAGNOSIS — Z79899 Other long term (current) drug therapy: Secondary | ICD-10-CM | POA: Diagnosis not present

## 2014-08-21 DIAGNOSIS — Z8669 Personal history of other diseases of the nervous system and sense organs: Secondary | ICD-10-CM | POA: Insufficient documentation

## 2014-08-21 DIAGNOSIS — H548 Legal blindness, as defined in USA: Secondary | ICD-10-CM | POA: Insufficient documentation

## 2014-08-21 DIAGNOSIS — S62634A Displaced fracture of distal phalanx of right ring finger, initial encounter for closed fracture: Secondary | ICD-10-CM | POA: Insufficient documentation

## 2014-08-21 DIAGNOSIS — Z880 Allergy status to penicillin: Secondary | ICD-10-CM | POA: Insufficient documentation

## 2014-08-21 DIAGNOSIS — Z7951 Long term (current) use of inhaled steroids: Secondary | ICD-10-CM | POA: Insufficient documentation

## 2014-08-21 DIAGNOSIS — F0390 Unspecified dementia without behavioral disturbance: Secondary | ICD-10-CM | POA: Insufficient documentation

## 2014-08-21 DIAGNOSIS — Z9889 Other specified postprocedural states: Secondary | ICD-10-CM | POA: Diagnosis not present

## 2014-08-21 NOTE — ED Notes (Signed)
Per EMS [facility] is [facility]  Pt injured her right hand and xrays were taken that state pt has a fracture involving the distal tuft of hte 4th distal phalanx with no displacement  Facility states she may also have a UTI as her urine was cloudy and they removed her foley catheter today  Specimen was sent but results are not available yet  Facility states pt has been more confused than normal and "has been talking out of her head"

## 2014-08-21 NOTE — ED Provider Notes (Signed)
CSN: [id number]     Arrival date & time 08/21/14  2334 History   First MD Initiated Contact with Patient 08/21/14 2342     Chief Complaint  Patient presents with  . Hand Injury  . Urinary Tract Infection     (Consider location/radiation/quality/duration/timing/severity/associated sxs/prior Treatment) HPI Comments: Patient here from nursing home with right hand injury as well as possible UTI. Was just admitted to the hospital 2 days ago for CHF and discharged. She does have a history of dementia and the history is from the nursing home records. She is unsure of how she injured her right hand. X-ray at time at the facility showed a distal tuft fracture of the fourth phalanx without displacement. I evaluated the patient 6 days ago and she appears to be at her baseline.  Patient is a 80 y.o. female presenting with hand injury and urinary tract infection. The history is provided by the patient. The history is limited by the condition of the patient.  Hand Injury Urinary Tract Infection    Past Medical History  Diagnosis Date  . Depression   . Ejection fraction     EF 40%, echo, November, 2012  / in improved, EF 60%, echo, December, 2012  . Contrast media allergy     Patient feels poorly with contrast  . Status post AAA (abdominal aortic aneurysm) repair     Surgical repair, Dr. Hart Rochester, December 27, 2011  . Pre-syncope     vasovagal w/ heart block  . Parotid mass     has refused further eval 10/2011  . GERD (gastroesophageal reflux disease)   . Anxiety   . Facial tic     L sided spasms - Botox trial summer 2013, ?effective  . ARMD (age related macular degeneration) 06/2014    bilateral vision loss - legally blind  . AAA (abdominal aortic aneurysm)     s/p repair 12/2011  . Congestive heart failure     NL EF 10/2011 echo   . Diabetes mellitus type II, controlled   . Hypothyroidism   . Sinus bradycardia 09/19/2011    Occurring simultaneously with complete heart block   .  Hypertension   . COPD (chronic obstructive pulmonary disease)   . Facial spasm     L hemifacial-treated w/ botox xeomin -Dr Terrace Arabia  . Dementia     MRI with progressive chronic microvascular ischemia and generalized cerebral atrophy  . Lumbosacral spondylosis without myelopathy   . Legally blind 06/2014    due to ARMD   Past Surgical History  Procedure Laterality Date  . Cholecystectomy    . Knee cartilage surgery      left  . Sinus surgery with instatrak    . Appendectomy    . Oophorectomy      ovarian cyst  . Cataract extraction      bilateral  . Bladder repair    . US echocardiography  10/14/11  . Abdominal aortic aneurysm repair  12/27/2011    ANEURYSM ABDOMINAL AORTIC REPAIR;  Surgeon: Josephina Gip, MD; Resection and Grafting Abdominal Aortic Aneurysm , Aorta Bi Iliac.  Marland Kitchen Pacemaker insertion  11/12  . Cardiac catheterization  11/04/11   Family History  Problem Relation Age of Onset  . Esophageal cancer Father   . Cancer Father   . Breast cancer Sister   . Cancer Sister   . Colon cancer Sister   . Heart disease Sister   . Heart disease Mother   . Heart disease Son   .  Parkinson's disease Sister     younger   History  Substance Use Topics  . Smoking status: Former Smoker -- 1.00 packs/day for 60 years    Types: Cigarettes    Quit date: 08/31/2011  . Smokeless tobacco: Never Used  . Alcohol Use: No   OB History   Grav Para Term Preterm Abortions TAB SAB Ect Mult Living                 Review of Systems  Unable to perform ROS     Allergies  Bactrim; Contrast media; Iodine; Iohexol; Sulfa antibiotics; Aricept; Cymbalta; Delsym; Guaifenesin & derivatives; Hydromet; Avelox; Ciprofloxacin; Gadolinium derivatives; Penicillins; and Quinolones  Home Medications   Prior to Admission medications   Medication Sig Start Date End Date Taking? Authorizing Provider  acetaminophen (TYLENOL) 500 MG tablet Take 500 mg by mouth every 6 (six) hours as needed (pain).     Historical Provider, MD  albuterol (PROVENTIL HFA;VENTOLIN HFA) 108 (90 BASE) MCG/ACT inhaler Inhale 2 puffs into the lungs every 6 (six) hours as needed for wheezing or shortness of breath. 03/24/14   Newt LukesValerie A Leschber, MD  aspirin 325 MG tablet Take 325 mg by mouth daily.    Historical Provider, MD  baclofen (LIORESAL) 10 MG tablet Take 1 tablet (10 mg total) by mouth 4 (four) times daily as needed for muscle spasms. 07/04/14   Eustaquio BoydenJavier Gutierrez, MD  benzonatate (TESSALON) 100 MG capsule Take 2 capsules (200 mg total) by mouth at bedtime as needed for cough. 06/30/14   Eustaquio BoydenJavier Gutierrez, MD  Calcium Carbonate-Vitamin D (CALCARB 600/D) 600-400 MG-UNIT per tablet Take 1 tablet by mouth every other day.     Historical Provider, MD  carvedilol (COREG) 6.25 MG tablet Take 6.25 mg by mouth 2 (two) times daily with a meal.    Historical Provider, MD  Cholecalciferol (VITAMIN D3) 5000 UNITS TABS Take 5,000 Units by mouth every other day.     Historical Provider, MD  clonazePAM (KLONOPIN) 0.5 MG tablet Take 1 tablet (0.5 mg total) by mouth 2 (two) times daily. 08/18/14   Vassie Lollarlos Madera, MD  cyanocobalamin 500 MCG tablet Take 500 mcg by mouth daily.    Historical Provider, MD  divalproex (DEPAKOTE) 125 MG DR tablet Take 2 tablets (250 mg total) by mouth 3 (three) times daily with meals. 08/18/14   Vassie Lollarlos Madera, MD  doxycycline (VIBRA-TABS) 100 MG tablet Take 1 tablet by mouth twice a day for 5 days 08/18/14 08/23/14  Vassie Lollarlos Madera, MD  fexofenadine (ALLEGRA) 180 MG tablet Take 1 tablet (180 mg total) by mouth daily. 04/25/14   Eustaquio BoydenJavier Gutierrez, MD  fluticasone (FLONASE) 50 MCG/ACT nasal spray Place 1 spray into both nostrils daily. 04/15/14   Eustaquio BoydenJavier Gutierrez, MD  furosemide (LASIX) 20 MG tablet Take 2 tablets (40 mg total) by mouth daily. 08/18/14   Vassie Lollarlos Madera, MD  haloperidol (HALDOL) 0.5 MG tablet Take 1 tablet (0.5 mg total) by mouth every 8 (eight) hours as needed for agitation. 08/18/14   Vassie Lollarlos Madera, MD    ipratropium-albuterol (DUONEB) 0.5-2.5 (3) MG/3ML SOLN Take 3 mLs by nebulization 3 (three) times daily. 08/18/14   Vassie Lollarlos Madera, MD  levothyroxine (SYNTHROID, LEVOTHROID) 175 MCG tablet Take 87.5-175 mcg by mouth daily before breakfast. Take 1 tab (175mcg) on Sunday, Monday, Wednesday, Friday, and Saturday. Take 0.5 tab (87.275mcg) on Tuesday and Thursday.    Historical Provider, MD  losartan (COZAAR) 50 MG tablet Take 1 tablet (50 mg total) by mouth daily. 08/18/14  Vassie Lollarlos Madera, MD  metFORMIN (GLUCOPHAGE-XR) 500 MG 24 hr tablet Take 250 mg by mouth at bedtime. 12/08/13   Newt LukesValerie A Leschber, MD  OLANZapine (ZYPREXA) 5 MG tablet Take 5 mg by mouth 2 (two) times daily.    Historical Provider, MD  Omega-3 Fatty Acids (FISH OIL) 1000 MG CAPS Take 1,000 mg by mouth daily.    Historical Provider, MD  pantoprazole (PROTONIX) 40 MG tablet Take 40 mg by mouth every other day.     Historical Provider, MD  Polyethyl Glycol-Propyl Glycol (SYSTANE) 0.4-0.3 % SOLN Apply 1 drop to eye 2 (two) times daily as needed (dry eyes).    Historical Provider, MD  potassium chloride SA (K-DUR,KLOR-CON) 20 MEQ tablet Take 20 mEq by mouth daily.    Historical Provider, MD  predniSONE (DELTASONE) 20 MG tablet Take 2 tablets by mouth X 1 day; then 2 tablets by mouth X 2 days; then 1 tablet by mouth X 2 days and then 1/2 tablet by mouth X 2 days. Stop prednisone after tast dose 08/18/14   Vassie Lollarlos Madera, MD  rivastigmine (EXELON) 4.6 mg/24hr Place 4.6 mg onto the skin every morning.    Historical Provider, MD  traZODone (DESYREL) 150 MG tablet Take 150 mg by mouth at bedtime.    Historical Provider, MD   BP 160/74  Pulse 69  Temp(Src) 99.1 F (37.3 C) (Oral)  Resp 22  SpO2 95% Physical Exam  Nursing note and vitals reviewed. Constitutional: She appears well-developed and well-nourished.  Non-toxic appearance. No distress.  HENT:  Head: Normocephalic and atraumatic.  Eyes: Conjunctivae, EOM and lids are normal. Pupils are  equal, round, and reactive to light.  Neck: Normal range of motion. Neck supple. No tracheal deviation present. No mass present.  Cardiovascular: Normal rate, regular rhythm and normal heart sounds.  Exam reveals no gallop.   No murmur heard. Pulmonary/Chest: Effort normal and breath sounds normal. No stridor. No respiratory distress. She has no decreased breath sounds. She has no wheezes. She has no rhonchi. She has no rales.  Abdominal: Soft. Normal appearance and bowel sounds are normal. She exhibits no distension. There is no tenderness. There is no rebound and no CVA tenderness.  Musculoskeletal: Normal range of motion. She exhibits no edema and no tenderness.       Hands: 1+ bilateral lower extremity edema  Neurological: She is alert. She has normal strength. No cranial nerve deficit or sensory deficit. GCS eye subscore is 4. GCS verbal subscore is 5. GCS motor subscore is 6.  Skin: Skin is warm and dry. No abrasion and no rash noted.  Psychiatric: She has a normal mood and affect. Her speech is normal and behavior is normal.    ED Course  Procedures (including critical care time) Labs Review Labs Reviewed  URINE CULTURE  URINALYSIS, ROUTINE W REFLEX MICROSCOPIC  I-STAT CHEM 8, ED    Imaging Review No results found.   EKG Interpretation None      MDM   Final diagnoses:  None    Splint applied by orthopedic tech and hand followup given. Patient to be treated for her urinary tract infection as well. Urine culture sent    Toy BakerAnthony T Genie Wenke, MD 08/22/14 0221

## 2014-08-22 ENCOUNTER — Emergency Department (HOSPITAL_COMMUNITY): Payer: Medicare Other

## 2014-08-22 DIAGNOSIS — S62634A Displaced fracture of distal phalanx of right ring finger, initial encounter for closed fracture: Secondary | ICD-10-CM | POA: Diagnosis not present

## 2014-08-22 LAB — I-STAT CHEM 8, ED
BUN: 28 mg/dL — ABNORMAL HIGH (ref 6–23)
CREATININE: 1.1 mg/dL (ref 0.50–1.10)
Calcium, Ion: 1.21 mmol/L (ref 1.13–1.30)
Chloride: 109 mEq/L (ref 96–112)
GLUCOSE: 143 mg/dL — AB (ref 70–99)
HCT: 35 % — ABNORMAL LOW (ref 36.0–46.0)
Hemoglobin: 11.9 g/dL — ABNORMAL LOW (ref 12.0–15.0)
Potassium: 4.1 mEq/L (ref 3.7–5.3)
SODIUM: 142 meq/L (ref 137–147)
TCO2: 23 mmol/L (ref 0–100)

## 2014-08-22 LAB — CULTURE, BLOOD (ROUTINE X 2)
CULTURE: NO GROWTH
Culture: NO GROWTH

## 2014-08-22 LAB — URINALYSIS, ROUTINE W REFLEX MICROSCOPIC
BILIRUBIN URINE: NEGATIVE
Glucose, UA: NEGATIVE mg/dL
HGB URINE DIPSTICK: NEGATIVE
Ketones, ur: NEGATIVE mg/dL
NITRITE: NEGATIVE
PROTEIN: NEGATIVE mg/dL
Specific Gravity, Urine: 1.028 (ref 1.005–1.030)
Urobilinogen, UA: 0.2 mg/dL (ref 0.0–1.0)
pH: 5.5 (ref 5.0–8.0)

## 2014-08-22 LAB — URINE MICROSCOPIC-ADD ON

## 2014-08-22 MED ORDER — CEPHALEXIN 500 MG PO CAPS: 500.0000 mg | ORAL_CAPSULE | Freq: Four times a day (QID) | ORAL | Status: DC

## 2014-08-22 NOTE — Discharge Instructions (Signed)
Wear the finger splint until seen by the hand surgeon. Called to schedule a followup appointment Finger Fracture Fractures of fingers are breaks in the bones of the fingers. There are many types of fractures. There are different ways of treating these fractures. Your health care provider will discuss the best way to treat your fracture. CAUSES Traumatic injury is the main cause of broken fingers. These include:  Injuries while playing sports.  Workplace injuries.  Falls. RISK FACTORS Activities that can increase your risk of finger fractures include:  Sports.  Workplace activities that involve machinery.  A condition called osteoporosis, which can make your bones less dense and cause them to fracture more easily. SIGNS AND SYMPTOMS The main symptoms of a broken finger are pain and swelling within 15 minutes after the injury. Other symptoms include:  Bruising of your finger.  Stiffness of your finger.  Numbness of your finger.  Exposed bones (compound fracture) if the fracture is severe. DIAGNOSIS  The best way to diagnose a broken bone is with X-ray imaging. Additionally, your health care provider will use this X-ray image to evaluate the position of the broken finger bones.  TREATMENT  Finger fractures can be treated with:   Nonreduction--This means the bones are in place. The finger is splinted without changing the positions of the bone pieces. The splint is usually left on for about a week to 10 days. This will depend on your fracture and what your health care provider thinks.  Closed reduction--The bones are put back into position without using surgery. The finger is then splinted.  Open reduction and internal fixation--The fracture site is opened. Then the bone pieces are fixed into place with pins or some type of hardware. This is seldom required. It depends on the severity of the fracture. HOME CARE INSTRUCTIONS   Follow your health care provider's instructions  regarding activities, exercises, and physical therapy.  Only take over-the-counter or prescription medicines for pain, discomfort, or fever as directed by your health care provider. SEEK MEDICAL CARE IF: You have pain or swelling that limits the motion or use of your fingers. SEEK IMMEDIATE MEDICAL CARE IF:  Your finger becomes numb. MAKE SURE YOU:   Understand these instructions.  Will watch your condition.  Will get help right away if you are not doing well or get worse. Document Released: 02/09/2001 Document Revised: 08/18/2013 Document Reviewed: 06/09/2013 [facility] Patient Information 2015 [employer], [place]. This information is not intended to replace advice given to you by your health care provider. Make sure you discuss any questions you have with your health care provider. Urinary Tract Infection Urinary tract infections (UTIs) can develop anywhere along your urinary tract. Your urinary tract is your body's drainage system for removing wastes and extra water. Your urinary tract includes two kidneys, two ureters, a bladder, and a urethra. Your kidneys are a pair of bean-shaped organs. Each kidney is about the size of your fist. They are located below your ribs, one on each side of your spine. CAUSES Infections are caused by microbes, which are microscopic organisms, including fungi, viruses, and bacteria. These organisms are so small that they can only be seen through a microscope. Bacteria are the microbes that most commonly cause UTIs. SYMPTOMS  Symptoms of UTIs may vary by age and gender of the patient and by the location of the infection. Symptoms in young women typically include a frequent and intense urge to urinate and a painful, burning feeling in the bladder or urethra during urination.  Older women and men are more likely to be tired, shaky, and weak and have muscle aches and abdominal pain. A fever may mean the infection is in your kidneys. Other symptoms of a kidney infection  include pain in your back or sides below the ribs, nausea, and vomiting. DIAGNOSIS To diagnose a UTI, your caregiver will ask you about your symptoms. Your caregiver also will ask to provide a urine sample. The urine sample will be tested for bacteria and white blood cells. White blood cells are made by your body to help fight infection. TREATMENT  Typically, UTIs can be treated with medication. Because most UTIs are caused by a bacterial infection, they usually can be treated with the use of antibiotics. The choice of antibiotic and length of treatment depend on your symptoms and the type of bacteria causing your infection. HOME CARE INSTRUCTIONS  If you were prescribed antibiotics, take them exactly as your caregiver instructs you. Finish the medication even if you feel better after you have only taken some of the medication.  Drink enough water and fluids to keep your urine clear or pale yellow.  Avoid caffeine, tea, and carbonated beverages. They tend to irritate your bladder.  Empty your bladder often. Avoid holding urine for long periods of time.  Empty your bladder before and after sexual intercourse.  After a bowel movement, women should cleanse from front to back. Use each tissue only once. SEEK MEDICAL CARE IF:   You have back pain.  You develop a fever.  Your symptoms do not begin to resolve within 3 days. SEEK IMMEDIATE MEDICAL CARE IF:   You have severe back pain or lower abdominal pain.  You develop chills.  You have nausea or vomiting.  You have continued burning or discomfort with urination. MAKE SURE YOU:   Understand these instructions.  Will watch your condition.  Will get help right away if you are not doing well or get worse. Document Released: 08/07/2005 Document Revised: 04/28/2012 Document Reviewed: 12/06/2011 [facility] Patient Information 2015 [place], [place]. This information is not intended to replace advice given to you by your health care  provider. Make sure you discuss any questions you have with your health care provider.

## 2014-08-23 LAB — URINE CULTURE
Colony Count: NO GROWTH
Culture: NO GROWTH

## 2014-08-25 ENCOUNTER — Ambulatory Visit: Payer: Medicare Other | Admitting: Family Medicine

## 2014-08-30 ENCOUNTER — Encounter: Payer: Medicare Other | Admitting: Family Medicine

## 2014-09-11 DIAGNOSIS — F03918 Unspecified dementia, unspecified severity, with other behavioral disturbance: Secondary | ICD-10-CM | POA: Insufficient documentation

## 2014-09-11 DIAGNOSIS — F0391 Unspecified dementia with behavioral disturbance: Secondary | ICD-10-CM | POA: Insufficient documentation

## 2014-09-11 DIAGNOSIS — Z515 Encounter for palliative care: Secondary | ICD-10-CM | POA: Insufficient documentation

## 2014-09-11 DIAGNOSIS — J9601 Acute respiratory failure with hypoxia: Secondary | ICD-10-CM | POA: Insufficient documentation

## 2014-09-28 ENCOUNTER — Ambulatory Visit: Payer: Medicare Other | Admitting: Cardiology

## 2014-10-15 ENCOUNTER — Emergency Department (HOSPITAL_COMMUNITY): Payer: Medicare Other

## 2014-10-15 ENCOUNTER — Emergency Department (HOSPITAL_COMMUNITY)
Admission: EM | Admit: 2014-10-15 | Discharge: 2014-10-16 | Disposition: A | Discharge status: Skilled Nursing Facility | Payer: Medicare Other | Attending: Emergency Medicine | Admitting: Emergency Medicine

## 2014-10-15 ENCOUNTER — Encounter (HOSPITAL_COMMUNITY): Payer: Self-pay | Admitting: Emergency Medicine

## 2014-10-15 DIAGNOSIS — E039 Hypothyroidism, unspecified: Secondary | ICD-10-CM | POA: Insufficient documentation

## 2014-10-15 DIAGNOSIS — Z9841 Cataract extraction status, right eye: Secondary | ICD-10-CM | POA: Insufficient documentation

## 2014-10-15 DIAGNOSIS — Z950 Presence of cardiac pacemaker: Secondary | ICD-10-CM | POA: Diagnosis not present

## 2014-10-15 DIAGNOSIS — Z7982 Long term (current) use of aspirin: Secondary | ICD-10-CM | POA: Diagnosis not present

## 2014-10-15 DIAGNOSIS — Z043 Encounter for examination and observation following other accident: Secondary | ICD-10-CM | POA: Insufficient documentation

## 2014-10-15 DIAGNOSIS — Y92129 Unspecified place in nursing home as the place of occurrence of the external cause: Secondary | ICD-10-CM | POA: Insufficient documentation

## 2014-10-15 DIAGNOSIS — N390 Urinary tract infection, site not specified: Secondary | ICD-10-CM | POA: Insufficient documentation

## 2014-10-15 DIAGNOSIS — E119 Type 2 diabetes mellitus without complications: Secondary | ICD-10-CM | POA: Insufficient documentation

## 2014-10-15 DIAGNOSIS — Z9889 Other specified postprocedural states: Secondary | ICD-10-CM | POA: Insufficient documentation

## 2014-10-15 DIAGNOSIS — J449 Chronic obstructive pulmonary disease, unspecified: Secondary | ICD-10-CM | POA: Diagnosis not present

## 2014-10-15 DIAGNOSIS — I509 Heart failure, unspecified: Secondary | ICD-10-CM | POA: Diagnosis not present

## 2014-10-15 DIAGNOSIS — Y998 Other external cause status: Secondary | ICD-10-CM | POA: Diagnosis not present

## 2014-10-15 DIAGNOSIS — H548 Legal blindness, as defined in USA: Secondary | ICD-10-CM | POA: Insufficient documentation

## 2014-10-15 DIAGNOSIS — F419 Anxiety disorder, unspecified: Secondary | ICD-10-CM | POA: Diagnosis not present

## 2014-10-15 DIAGNOSIS — Z79899 Other long term (current) drug therapy: Secondary | ICD-10-CM | POA: Insufficient documentation

## 2014-10-15 DIAGNOSIS — Y939 Activity, unspecified: Secondary | ICD-10-CM | POA: Insufficient documentation

## 2014-10-15 DIAGNOSIS — Z880 Allergy status to penicillin: Secondary | ICD-10-CM | POA: Diagnosis not present

## 2014-10-15 DIAGNOSIS — Z9842 Cataract extraction status, left eye: Secondary | ICD-10-CM | POA: Diagnosis not present

## 2014-10-15 DIAGNOSIS — F0390 Unspecified dementia without behavioral disturbance: Secondary | ICD-10-CM | POA: Insufficient documentation

## 2014-10-15 DIAGNOSIS — F329 Major depressive disorder, single episode, unspecified: Secondary | ICD-10-CM | POA: Diagnosis not present

## 2014-10-15 DIAGNOSIS — Z7951 Long term (current) use of inhaled steroids: Secondary | ICD-10-CM | POA: Insufficient documentation

## 2014-10-15 DIAGNOSIS — Z87891 Personal history of nicotine dependence: Secondary | ICD-10-CM | POA: Insufficient documentation

## 2014-10-15 DIAGNOSIS — R918 Other nonspecific abnormal finding of lung field: Secondary | ICD-10-CM

## 2014-10-15 DIAGNOSIS — I10 Essential (primary) hypertension: Secondary | ICD-10-CM | POA: Diagnosis not present

## 2014-10-15 DIAGNOSIS — W19XXXA Unspecified fall, initial encounter: Secondary | ICD-10-CM | POA: Diagnosis not present

## 2014-10-15 DIAGNOSIS — K219 Gastro-esophageal reflux disease without esophagitis: Secondary | ICD-10-CM | POA: Diagnosis not present

## 2014-10-15 LAB — TROPONIN I: Troponin I: <0.3 ng/mL (ref <0.30)

## 2014-10-15 LAB — CBC WITH DIFFERENTIAL/PLATELET
BASOS PCT: 0 % (ref 0–1)
Basophils Absolute: 0 10*3/uL (ref 0.0–0.1)
EOS ABS: 0 10*3/uL (ref 0.0–0.7)
Eosinophils Relative: 0 % (ref 0–5)
HEMATOCRIT: 35.2 % — AB (ref 36.0–46.0)
HEMOGLOBIN: 11.4 g/dL — AB (ref 12.0–15.0)
LYMPHS ABS: 3.4 10*3/uL (ref 0.7–4.0)
Lymphocytes Relative: 20 % (ref 12–46)
MCH: 33.9 pg (ref 26.0–34.0)
MCHC: 32.4 g/dL (ref 30.0–36.0)
MCV: 104.8 fL — AB (ref 78.0–100.0)
MONO ABS: 1.2 10*3/uL — AB (ref 0.1–1.0)
Monocytes Relative: 7 % (ref 3–12)
NEUTROS ABS: 12.2 10*3/uL — AB (ref 1.7–7.7)
Neutrophils Relative %: 73 % (ref 43–77)
Platelets: 301 10*3/uL (ref 150–400)
RBC: 3.36 MIL/uL — ABNORMAL LOW (ref 3.87–5.11)
RDW: 16.2 % — ABNORMAL HIGH (ref 11.5–15.5)
WBC: 16.8 10*3/uL — ABNORMAL HIGH (ref 4.0–10.5)

## 2014-10-15 LAB — URINALYSIS, ROUTINE W REFLEX MICROSCOPIC
BILIRUBIN URINE: NEGATIVE
GLUCOSE, UA: NEGATIVE mg/dL
Hgb urine dipstick: NEGATIVE
KETONES UR: NEGATIVE mg/dL
Nitrite: NEGATIVE
PROTEIN: 30 mg/dL — AB
Specific Gravity, Urine: 1.023 (ref 1.005–1.030)
Urobilinogen, UA: 1 mg/dL (ref 0.0–1.0)
pH: 8.5 — ABNORMAL HIGH (ref 5.0–8.0)

## 2014-10-15 LAB — BASIC METABOLIC PANEL
Anion gap: 13 (ref 5–15)
BUN: 19 mg/dL (ref 6–23)
CALCIUM: 9.5 mg/dL (ref 8.4–10.5)
CO2: 28 mEq/L (ref 19–32)
Chloride: 101 mEq/L (ref 96–112)
Creatinine, Ser: 0.97 mg/dL (ref 0.50–1.10)
GFR calc Af Amer: 60 mL/min — ABNORMAL LOW (ref >90)
GFR, EST NON AFRICAN AMERICAN: 52 mL/min — AB (ref >90)
Glucose, Bld: 143 mg/dL — ABNORMAL HIGH (ref 70–99)
Potassium: 3.8 mEq/L (ref 3.7–5.3)
Sodium: 142 mEq/L (ref 137–147)

## 2014-10-15 LAB — CBG MONITORING, ED: Glucose-Capillary: 140 mg/dL — ABNORMAL HIGH (ref 70–99)

## 2014-10-15 LAB — URINE MICROSCOPIC-ADD ON

## 2014-10-15 LAB — I-STAT CG4 LACTIC ACID, ED: LACTIC ACID, VENOUS: 1.34 mmol/L (ref 0.5–2.2)

## 2014-10-15 MED ORDER — VANCOMYCIN HCL IN DEXTROSE 1-5 GM/200ML-% IV SOLN
1000.0000 mg | INTRAVENOUS | Status: DC
  Administered 2014-10-15: 1000 mg via INTRAVENOUS
  Filled 2014-10-15: qty 200

## 2014-10-15 MED ORDER — AZTREONAM 2 G IJ SOLR
2.0000 g | Freq: Once | INTRAMUSCULAR | Status: AC
  Administered 2014-10-15: 2 g via INTRAVENOUS
  Filled 2014-10-15: qty 2

## 2014-10-15 MED ORDER — SODIUM CHLORIDE 0.9 % IV SOLN
1000.0000 mL | INTRAVENOUS | Status: DC
  Administered 2014-10-15: 1000 mL via INTRAVENOUS

## 2014-10-15 MED ORDER — VANCOMYCIN HCL IN DEXTROSE 750-5 MG/150ML-% IV SOLN
750.0000 mg | Freq: Two times a day (BID) | INTRAVENOUS | Status: DC
  Filled 2014-10-15: qty 150

## 2014-10-15 MED ORDER — NITROFURANTOIN MONOHYD MACRO 100 MG PO CAPS: 100.0000 mg | ORAL_CAPSULE | Freq: Two times a day (BID) | ORAL | Status: DC

## 2014-10-15 NOTE — ED Provider Notes (Signed)
CSN: [id number]     Arrival date & time 10/15/14  1807 History   First MD Initiated Contact with Patient 10/15/14 1815     Chief Complaint  Patient presents with  . Fall     (Consider location/radiation/quality/duration/timing/severity/associated sxs/prior Treatment) HPI   Paula [name] is a 80 y.o. female who is DO NOT RESUSCITATE brought by ambulance from skilled nursing facility with past medical history significant for CHF, vasovagal syncope with heart block, diabetes, lives at [facility] nursing home who called husband reported the patient was found on the side of her bed several hours ago. Patient denies falling, cannot explain why she was on the floor states that she just went to the floor and couldn't get up. Patient is nonambulatory at her baseline and uses a wheelchair. Patient denies headache, change of vision, dysarthria, chest pain, shortness of breath, abdominal pain. Patient has swelling to right hand which she also denies pain.  Past Medical History  Diagnosis Date  . Depression   . Ejection fraction     EF 40%, echo, November, 2012  / in improved, EF 60%, echo, December, 2012  . Contrast media allergy     Patient feels poorly with contrast  . Status post AAA (abdominal aortic aneurysm) repair     Surgical repair, Dr. Hart Rochester, December 27, 2011  . Pre-syncope     vasovagal w/ heart block  . Parotid mass     has refused further eval 10/2011  . GERD (gastroesophageal reflux disease)   . Anxiety   . Facial tic     L sided spasms - Botox trial summer 2013, ?effective  . ARMD (age related macular degeneration) 06/2014    bilateral vision loss - legally blind  . AAA (abdominal aortic aneurysm)     s/p repair 12/2011  . Congestive heart failure     NL EF 10/2011 echo   . Diabetes mellitus type II, controlled   . Hypothyroidism   . Sinus bradycardia 09/19/2011    Occurring simultaneously with complete heart block   . Hypertension   . COPD (chronic obstructive  pulmonary disease)   . Facial spasm     L hemifacial-treated w/ botox xeomin -Dr Terrace Arabia  . Dementia     MRI with progressive chronic microvascular ischemia and generalized cerebral atrophy  . Lumbosacral spondylosis without myelopathy   . Legally blind 06/2014    due to ARMD   Past Surgical History  Procedure Laterality Date  . Cholecystectomy    . Knee cartilage surgery      left  . Sinus surgery with instatrak    . Appendectomy    . Oophorectomy      ovarian cyst  . Cataract extraction      bilateral  . Bladder repair    . US echocardiography  10/14/11  . Abdominal aortic aneurysm repair  12/27/2011    ANEURYSM ABDOMINAL AORTIC REPAIR;  Surgeon: Josephina Gip, MD; Resection and Grafting Abdominal Aortic Aneurysm , Aorta Bi Iliac.  Marland Kitchen Pacemaker insertion  11/12  . Cardiac catheterization  11/04/11   Family History  Problem Relation Age of Onset  . Esophageal cancer Father   . Cancer Father   . Breast cancer Sister   . Cancer Sister   . Colon cancer Sister   . Heart disease Sister   . Heart disease Mother   . Heart disease Son   . Parkinson's disease Sister     younger   History  Substance Use Topics  .  Smoking status: Former Smoker -- 1.00 packs/day for 60 years    Types: Cigarettes    Quit date: 08/31/2011  . Smokeless tobacco: Never Used  . Alcohol Use: No   OB History    No data available     Review of Systems  10 systems reviewed and found to be negative, except as noted in the HPI.   Allergies  Bactrim; Contrast media; Iodine; Iohexol; Sulfa antibiotics; Aricept; Cymbalta; Delsym; Guaifenesin & derivatives; Hydromet; Avelox; Ciprofloxacin; Gadolinium derivatives; Penicillins; and Quinolones  Home Medications   Prior to Admission medications   Medication Sig Start Date End Date Taking? Authorizing Provider  acetaminophen (TYLENOL) 500 MG tablet Take 500 mg by mouth every 6 (six) hours as needed (pain).   Yes Historical Provider, MD  albuterol (PROVENTIL  HFA;VENTOLIN HFA) 108 (90 BASE) MCG/ACT inhaler Inhale 2 puffs into the lungs every 6 (six) hours as needed for wheezing or shortness of breath. 03/24/14  Yes Newt LukesValerie A Leschber, MD  aspirin 325 MG tablet Take 325 mg by mouth daily.   Yes Historical Provider, MD  Calcium Carbonate-Vitamin D (CALCARB 600/D) 600-400 MG-UNIT per tablet Take 1 tablet by mouth every other day.    Yes Historical Provider, MD  carvedilol (COREG) 6.25 MG tablet Take 6.25 mg by mouth 2 (two) times daily with a meal.   Yes Historical Provider, MD  Cholecalciferol (VITAMIN D3) 5000 UNITS TABS Take 5,000 Units by mouth every other day.    Yes Historical Provider, MD  clonazePAM (KLONOPIN) 0.5 MG tablet Take 1 tablet (0.5 mg total) by mouth 2 (two) times daily. 08/18/14  Yes Vassie Lollarlos Madera, MD  cyanocobalamin 500 MCG tablet Take 500 mcg by mouth daily.   Yes Historical Provider, MD  divalproex (DEPAKOTE) 125 MG DR tablet Take 2 tablets (250 mg total) by mouth 3 (three) times daily with meals. 08/18/14  Yes Vassie Lollarlos Madera, MD  fexofenadine (ALLEGRA) 180 MG tablet Take 1 tablet (180 mg total) by mouth daily. 04/25/14  Yes Eustaquio BoydenJavier Gutierrez, MD  fluticasone (FLONASE) 50 MCG/ACT nasal spray Place 1 spray into both nostrils daily. 04/15/14  Yes Eustaquio BoydenJavier Gutierrez, MD  furosemide (LASIX) 20 MG tablet Take 2 tablets (40 mg total) by mouth daily. 08/18/14  Yes Vassie Lollarlos Madera, MD  haloperidol (HALDOL) 0.5 MG tablet Take 1 tablet (0.5 mg total) by mouth every 8 (eight) hours as needed for agitation. 08/18/14  Yes Vassie Lollarlos Madera, MD  ipratropium-albuterol (DUONEB) 0.5-2.5 (3) MG/3ML SOLN Take 3 mLs by nebulization 3 (three) times daily. 08/18/14  Yes Vassie Lollarlos Madera, MD  levothyroxine (SYNTHROID, LEVOTHROID) 175 MCG tablet Take 87.5-175 mcg by mouth daily before breakfast. Take 1 tablet on Sunday, Monday, Wednesday, Friday, & Saturday Take 0.5 tablet on Tuesday & Thursday   Yes Historical Provider, MD  loratadine (CLARITIN) 10 MG tablet Take 10 mg by mouth  daily.   Yes Historical Provider, MD  losartan-hydrochlorothiazide (HYZAAR) 100-12.5 MG per tablet Take 1 tablet by mouth 2 (two) times daily.    Yes Historical Provider, MD  metFORMIN (GLUCOPHAGE-XR) 500 MG 24 hr tablet Take 250 mg by mouth at bedtime. 12/08/13  Yes Newt LukesValerie A Leschber, MD  OLANZapine (ZYPREXA) 5 MG tablet Take 5 mg by mouth 2 (two) times daily.   Yes Historical Provider, MD  pantoprazole (PROTONIX) 40 MG tablet Take 40 mg by mouth every other day.    Yes Historical Provider, MD  Polyethyl Glycol-Propyl Glycol (SYSTANE) 0.4-0.3 % SOLN Apply 1 drop to eye 2 (two) times daily as needed (  dry eyes).   Yes Historical Provider, MD  potassium chloride SA (K-DUR,KLOR-CON) 20 MEQ tablet Take 20 mEq by mouth daily.   Yes Historical Provider, MD  rivastigmine (EXELON) 4.6 mg/24hr Place 4.6 mg onto the skin every morning.   Yes Historical Provider, MD  traZODone (DESYREL) 150 MG tablet Take 150 mg by mouth at bedtime.   Yes Historical Provider, MD  nitrofurantoin, macrocrystal-monohydrate, (MACROBID) 100 MG capsule Take 1 capsule (100 mg total) by mouth 2 (two) times daily. X 7 days 10/15/14   Joni Reining Chana Lindstrom, PA-C   BP 154/56 mmHg  Pulse 64  Temp(Src) 98.1 F (36.7 C) (Oral)  Resp 22  SpO2 97% Physical Exam  Constitutional: She is oriented to person, place, and time. She appears well-developed and well-nourished. No distress.  HENT:  Head: Normocephalic.  Eyes: Conjunctivae and EOM are normal.  Cardiovascular: Normal rate.   Pulmonary/Chest: Effort normal. No stridor.  Musculoskeletal: Normal range of motion.  Neurological: She is alert and oriented to person, place, and time.  Psychiatric: She has a normal mood and affect.  Nursing note and vitals reviewed.   ED Course  Procedures (including critical care time) Labs Review Labs Reviewed  URINALYSIS, ROUTINE W REFLEX MICROSCOPIC - Abnormal; Notable for the following:    APPearance CLOUDY (*)    pH 8.5 (*)    Protein, ur 30  (*)    Leukocytes, UA MODERATE (*)    All other components within normal limits  CBC WITH DIFFERENTIAL - Abnormal; Notable for the following:    WBC 16.8 (*)    RBC 3.36 (*)    Hemoglobin 11.4 (*)    HCT 35.2 (*)    MCV 104.8 (*)    RDW 16.2 (*)    Neutro Abs 12.2 (*)    Monocytes Absolute 1.2 (*)    All other components within normal limits  BASIC METABOLIC PANEL - Abnormal; Notable for the following:    Glucose, Bld 143 (*)    GFR calc non Af Amer 52 (*)    GFR calc Af Amer 60 (*)    All other components within normal limits  URINE MICROSCOPIC-ADD ON - Abnormal; Notable for the following:    Bacteria, UA MANY (*)    All other components within normal limits  CBG MONITORING, ED - Abnormal; Notable for the following:    Glucose-Capillary 140 (*)    All other components within normal limits  CULTURE, BLOOD (ROUTINE X 2)  CULTURE, BLOOD (ROUTINE X 2)  URINE CULTURE  TROPONIN I  I-STAT CG4 LACTIC ACID, ED    Imaging Review Dg Chest 2 View  10/15/2014   CLINICAL DATA:  80 year old female with history of trauma from a fall out of bed.  EXAM: CHEST  2 VIEW  COMPARISON:  Chest x-ray 08/15/2014.  FINDINGS: Lung volumes are normal. No definite consolidative airspace disease. Trace bilateral pleural effusions. Mild diffuse interstitial prominence, particularly in the lung bases bilaterally, where there is also extensive peribronchial cuffing. Multifocal new nodular opacities seen projecting over the lungs bilaterally heart size is mildly enlarged. The patient is rotated to the right on today's exam, resulting in distortion of the mediastinal contours and reduced diagnostic sensitivity and specificity for mediastinal pathology. Atherosclerosis in the thoracic aorta.  IMPRESSION: 1. Extensive peribronchial cuffing and interstitial prominence in the lung bases bilaterally concerning for sequela of recent aspiration or developing multilobar bronchopneumonia. 2. In addition, there are multiple new  nodular opacities throughout the lungs bilaterally. This could be  related to developing multilobar bronchopneumonia, however, the appearance is concerning for potential metastatic disease. At the very least, attention at time of follow-up chest x-rays is recommended. Alternatively, a followup chest CT could be performed if clinically appropriate. 3. Atherosclerosis.   Electronically Signed   By: Trudie Reed M.D.   On: 10/15/2014 19:55   Dg Wrist Complete Left  10/15/2014   CLINICAL DATA:  80 year old female with history of trauma from a fall out of bed. Left hand and wrist pain.  EXAM: LEFT WRIST - COMPLETE 3+ VIEW  COMPARISON:  No priors.  FINDINGS: Multiple views of the left wrist demonstrate no acute displaced fracture, subluxation, dislocation, or soft tissue abnormality. Mild multifocal degenerative changes of osteoarthritis.  IMPRESSION: 1. No acute radiographic abnormality of the left wrist.   Electronically Signed   By: Trudie Reed M.D.   On: 10/15/2014 19:59   Ct Head Wo Contrast  10/15/2014   CLINICAL DATA:  Initial evaluation for on witnessed fall out of bed and, patient found lying on floor at nursing home, no acute distress  EXAM: CT HEAD WITHOUT CONTRAST  CT CERVICAL SPINE WITHOUT CONTRAST  TECHNIQUE: Multidetector CT imaging of the head and cervical spine was performed following the standard protocol without intravenous contrast. Multiplanar CT image reconstructions of the cervical spine were also generated.  COMPARISON:  04/27/2014  FINDINGS: CT HEAD FINDINGS  No skull fracture. Visualized portions of the sinuses clear. No hemorrhage or extra-axial fluid. Moderate to severe age-related atrophy with severe age-related low attenuation in the deep white matter. No evidence of mass or infarct.  CT CERVICAL SPINE FINDINGS  No acute soft tissue abnormality. No prevertebral soft tissue swelling. Reversed lordosis. Grade 1 minimal anterior listhesis of C4 on C5. Mild C4-5 degenerative disc  disease. Moderate C5-6, C6-7, and C7-T1 degenerative disc disease.  IMPRESSION: No acute intracranial abnormality. Chronic age-related involutional change identified.  Degenerative changes in the cervical spine with no acute findings.   Electronically Signed   By: Esperanza Heir M.D.   On: 10/15/2014 20:35   Ct Chest Wo Contrast  10/15/2014   CLINICAL DATA:  Status post unwitnessed fall. Abnormal chest radiograph, with question for nodules. Initial encounter.  EXAM: CT CHEST WITHOUT CONTRAST  TECHNIQUE: Multidetector CT imaging of the chest was performed following the standard protocol without IV contrast.  COMPARISON:  Chest radiograph performed earlier today at 7:16 p.m., and CT of the abdomen and pelvis from 10/26/2013  FINDINGS: Mild bibasilar atelectasis is noted. The lungs are otherwise clear. There is mild peribronchial thickening at the lung bases. A large calcified granuloma is noted at the left lung base. No suspicious masses are seen. No pleural effusion or pneumothorax is identified.  The heart is mildly enlarged. Diffuse coronary artery calcifications are seen. Scattered calcified left hilar nodes are noted, without evidence of mediastinal lymphadenopathy. No pericardial effusion is identified. The great vessels are grossly unremarkable in appearance. The thyroid gland is diminutive and not well assessed. No axillary lymphadenopathy is seen.  Nodular densities noted at the right mid lung zone reflect healed chronic deformities at the right anterior ribs, due to prior rib fractures. Chronic rib deformities are also noted on the left side. Note is made of chronic osseous fusion at T11-T12.  The visualized portions of the liver and spleen are grossly unremarkable in appearance, aside from scattered calcified granulomata within the spleen. There is aneurysmal dilatation of the abdominal aorta about the level of the renal arteries, better characterized on prior  CT.  IMPRESSION: 1. No evidence of acute  traumatic injury to the chest. 2. Nodular densities noted at the right midlung zone reflect healed chronic deformities at the right anterior ribs, due to prior rib fractures. Chronic left-sided rib deformities also noted. 3. Nodular density at the left lung base reflects a large calcified granuloma at the left lung base. Scattered calcified left hilar nodes seen, likely reflecting sequelae of remote granulomatous disease. 4. Mild bibasilar atelectasis noted; mild peribronchial thickening at the lung bases. Lungs otherwise clear. 5. Mild cardiomegaly.  Diffuse coronary artery calcifications seen. 6. Aneurysmal dilatation of the abdominal aorta about the level of the renal arteries is better characterized on prior CT.   Electronically Signed   By: Roanna RaiderJeffery  Chang M.D.   On: 10/15/2014 22:07   Ct Cervical Spine Wo Contrast  10/15/2014   CLINICAL DATA:  Initial evaluation for on witnessed fall out of bed and, patient found lying on floor at nursing home, no acute distress  EXAM: CT HEAD WITHOUT CONTRAST  CT CERVICAL SPINE WITHOUT CONTRAST  TECHNIQUE: Multidetector CT imaging of the head and cervical spine was performed following the standard protocol without intravenous contrast. Multiplanar CT image reconstructions of the cervical spine were also generated.  COMPARISON:  04/27/2014  FINDINGS: CT HEAD FINDINGS  No skull fracture. Visualized portions of the sinuses clear. No hemorrhage or extra-axial fluid. Moderate to severe age-related atrophy with severe age-related low attenuation in the deep white matter. No evidence of mass or infarct.  CT CERVICAL SPINE FINDINGS  No acute soft tissue abnormality. No prevertebral soft tissue swelling. Reversed lordosis. Grade 1 minimal anterior listhesis of C4 on C5. Mild C4-5 degenerative disc disease. Moderate C5-6, C6-7, and C7-T1 degenerative disc disease.  IMPRESSION: No acute intracranial abnormality. Chronic age-related involutional change identified.  Degenerative changes  in the cervical spine with no acute findings.   Electronically Signed   By: Esperanza Heiraymond  Rubner M.D.   On: 10/15/2014 20:35   Dg Hand Complete Left  10/15/2014   CLINICAL DATA:  80 year old female with history of trauma from a fall on a that complaining of left hand pain.  EXAM: LEFT HAND - COMPLETE 3+ VIEW  COMPARISON:  No priors.  FINDINGS: There is no evidence of fracture or dislocation. There is no other focal bone abnormality. Mild multifocal degenerative changes of osteoarthritis are noted. Soft tissues are unremarkable.  IMPRESSION: Negative.   Electronically Signed   By: Trudie Reedaniel  Entrikin M.D.   On: 10/15/2014 19:57     EKG Interpretation None      MDM   Final diagnoses:  Fall at nursing home, initial encounter  UTI (lower urinary tract infection)    Filed Vitals:   10/15/14 1807 10/15/14 2213 10/15/14 2214 10/15/14 2300  BP: 127/71 137/53  154/56  Pulse: 80  59 64  Temp: 98.1 F (36.7 C)     TempSrc: Oral     Resp: 18   22  SpO2: 93%  94% 97%    Medications  0.9 %  sodium chloride infusion (1,000 mLs Intravenous New Bag/Given 10/15/14 2128)  aztreonam (AZACTAM) 2 g in dextrose 5 % 50 mL IVPB (0 g Intravenous Stopped 10/15/14 2157)    Paula [name] is a pleasant 80 y.o. female presenting with  fall at nursing home. Patient has a significant leukocytosis of 16.8. Chest x-ray shows concern for developing bilateral aspiration pneumonia versus multilobar pneumonia and multiple new nodular opacities, differential includes multilobar pneumonia versus metastases. We'll draw blood cultures  and start HCAP (SNF) Regimen of aztreonam and vancomycin.   CT of chest with no signs of infiltrate, nodular densities are healed deformity status post rib fracture. Attending physician has spoken to radiologist to verify that there are no nodules or pneumonia. Urinalysis is consistent with infection. Will be DC to home with Macrobid.  This is a shared visit with the attending physician who  personally evaluated the patient and agrees with the care plan.   Evaluation does not show pathology that would require ongoing emergent intervention or inpatient treatment. Pt is hemodynamically stable and mentating appropriately. Discussed findings and plan with patient/guardian, who agrees with care plan. All questions answered. Return precautions discussed and outpatient follow up given.   New Prescriptions   NITROFURANTOIN, MACROCRYSTAL-MONOHYDRATE, (MACROBID) 100 MG CAPSULE    Take 1 capsule (100 mg total) by mouth 2 (two) times daily. X 7 days    ,    Wynetta Emery, PA-C 10/16/14 0106  Toy Cookey, MD 10/16/14 1151  Toy Cookey, MD 10/16/14 [phone]

## 2014-10-15 NOTE — Progress Notes (Signed)
[facility] called for transport 

## 2014-10-15 NOTE — ED Notes (Signed)
Pt BIB EMS. [facility] Pt had unwitnessed fall out of bed. Pt found lying prone next to bed. Pt able to stand with assistance from EMS. Pt has some swelling to L hand. Alert to baseline per NH staff. Pt is DNR. Alert, no acute distress. Skin warm and dry.

## 2014-10-15 NOTE — Progress Notes (Signed)
ANTIBIOTIC CONSULT NOTE - INITIAL  Pharmacy Consult for vancomycin Indication: rule out pneumonia  Allergies  Allergen Reactions  . Bactrim [Sulfamethoxazole-Trimethoprim] Anaphylaxis  . Contrast Media [Iodinated Diagnostic Agents] Anaphylaxis and Swelling  . Iodine Anaphylaxis  . Iohexol Anaphylaxis, Shortness Of Breath and Swelling     Desc: PT STATES SHE HAD A SEVERE REACTION TO IV CONRAST WITH THROAT SWELLING AND SOB. SHE WAS ADMITTED TO THE HOSPITAL. SHE HAS NEVER HAD CONTRAST AGAIN.   Marland Kitchen. Sulfa Antibiotics Anaphylaxis  . Aricept [Donepezil Hcl] Nausea And Vomiting  . Cymbalta [Duloxetine Hcl] Other (See Comments)    HALLUCINATIONS    . Delsym [Dextromethorphan] Other (See Comments)    Hallucination  . Guaifenesin & Derivatives Other (See Comments)    Hallucination  . Hydromet [Hydrocodone-Homatropine] Other (See Comments)    hallucinations  . Avelox [Moxifloxacin Hcl In Nacl] Rash  . Ciprofloxacin Itching  . Gadolinium Derivatives Other (See Comments)    Unknown    . Penicillins Rash    Rash as a child  . Quinolones Rash    Patient Measurements:   Adjusted Body Weight:   Vital Signs: Temp: 98.1 F (36.7 C) (12/05 1807) Temp Source: Oral (12/05 1807) BP: 127/71 mmHg (12/05 1807) Pulse Rate: 80 (12/05 1807) Intake/Output from previous day:   Intake/Output from this shift:    Labs:  Recent Labs  10/15/14 1954  WBC 16.8*  HGB 11.4*  PLT 301  CREATININE 0.97   CrCl cannot be calculated (Unknown ideal weight.). No results for input(s): VANCOTROUGH, VANCOPEAK, VANCORANDOM, GENTTROUGH, GENTPEAK, GENTRANDOM, TOBRATROUGH, TOBRAPEAK, TOBRARND, AMIKACINPEAK, AMIKACINTROU, AMIKACIN in the last 72 hours.   Microbiology: No results found for this or any previous visit (from the past 720 hour(s)).  Medical History: Past Medical History  Diagnosis Date  . Depression   . Ejection fraction     EF 40%, echo, November, 2012  / in improved, EF 60%, echo, December,  2012  . Contrast media allergy     Patient feels poorly with contrast  . Status post AAA (abdominal aortic aneurysm) repair     Surgical repair, Dr. Hart RochesterLawson, December 27, 2011  . Pre-syncope     vasovagal w/ heart block  . Parotid mass     has refused further eval 10/2011  . GERD (gastroesophageal reflux disease)   . Anxiety   . Facial tic     L sided spasms - Botox trial summer 2013, ?effective  . ARMD (age related macular degeneration) 06/2014    bilateral vision loss - legally blind  . AAA (abdominal aortic aneurysm)     s/p repair 12/2011  . Congestive heart failure     NL EF 10/2011 echo   . Diabetes mellitus type II, controlled   . Hypothyroidism   . Sinus bradycardia 09/19/2011    Occurring simultaneously with complete heart block   . Hypertension   . COPD (chronic obstructive pulmonary disease)   . 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 Dementia     MRI with progressive chronic microvascular ischemia and generalized cerebral atrophy  . Lumbosacral spondylosis without myelopathy   . Legally blind 06/2014    due to ARMD    Assessment: [age] YOF presents from NH with fall. CXR reveals interstitial prominence in BL lungs concerning for sequela of recent aspiration or developing multilobar bronchopneumonia.  Given aztreonam x1 in ED and pharmacy asked to dose vancomycin.  She has h/o childhood rash to PCN but  given cephalosporins on multiple previous admissions.   12/5 >> vancomycin  >> 12/5 >> aztreonam x 1 in ED    Tmax: afeb WBCs: 16.8 Renal: SCr = 0.97, est normalized CrCl = 2548ml/min (note she is wheelchair bound so SCr may overestimate true renal fx)  12/5 blood: IP 12/5 urine: IP  Goal of Therapy:  Vancomycin trough level 15-20 mcg/ml  Plan:   Vancomycin 1gm x 1 Then vancomycin 750mg  IV q12h  Check steady state trough  Follow renal function  Suggest using cefepime >> aztreonam following 1x dose in ED  Juliette Alcideustin Zeigler, PharmD, BCPS.    Pager: [phone]  10/15/2014,8:50 PM

## 2014-10-15 NOTE — Discharge Instructions (Signed)
°  Take your antibiotics as directed and to completion. You should never have any leftover antibiotics! Push fluids and stay well hydrated.  ° °Please follow with your primary care doctor in the next 2 days for a check-up. They must obtain records for further management.  ° °Do not hesitate to return to the Emergency Department for any new, worsening or concerning symptoms.  ° °

## 2014-10-15 NOTE — ED Notes (Signed)
Bed: [id number] Expected date: 10/15/14 Expected time: 5:57 PM Means of arrival: Ambulance Comments: Larey SeatFell out of bed

## 2014-10-15 NOTE — ED Notes (Signed)
Patient transported to CT 

## 2014-10-18 LAB — URINE CULTURE

## 2014-10-19 ENCOUNTER — Encounter (HOSPITAL_COMMUNITY): Payer: Self-pay | Admitting: Internal Medicine

## 2014-10-19 ENCOUNTER — Telehealth (HOSPITAL_BASED_OUTPATIENT_CLINIC_OR_DEPARTMENT_OTHER): Payer: Self-pay | Admitting: Emergency Medicine

## 2014-10-19 NOTE — Telephone Encounter (Signed)
Post ED Visit - Positive Culture Follow-up  Culture report reviewed by antimicrobial stewardship pharmacist: []  Paula [name], Pharm.D., BCPS [x]  Paula MiyamotoJeremy [name], Pharm.D., BCPS []  Paula [name], Pharm.D., BCPS []  WilliamsburgMinh [name], [address] Rainbow BoulevardPharm.D., BCPS, AAHIVP []  Paula [name], Pharm.D., BCPS, AAHIVP []  Paula [name], Pharm.D.    Positive urine culture Proteus >100,000 Treated with Nitrofurantoin 100mg  bid x 7 days, organism sensitive to the same and no further patient follow-up is required at this time. Urine culture [facility] per request  Berle MullMiller, Paula [name] 10/19/2014, 11:03 AM

## 2014-10-22 LAB — CULTURE, BLOOD (ROUTINE X 2)
CULTURE: NO GROWTH
Culture: NO GROWTH

## 2014-11-21 ENCOUNTER — Other Ambulatory Visit: Payer: Self-pay

## 2014-11-21 DIAGNOSIS — Z1231 Encounter for screening mammogram for malignant neoplasm of breast: Secondary | ICD-10-CM

## 2014-11-29 ENCOUNTER — Inpatient Hospital Stay (HOSPITAL_COMMUNITY)
Admission: EM | Admit: 2014-11-29 | Discharge: 2014-12-01 | DRG: 603 | Disposition: A | Discharge status: Nursing Home (Includes ICF) | Payer: Medicare Other | Attending: Internal Medicine | Admitting: Internal Medicine

## 2014-11-29 ENCOUNTER — Encounter (HOSPITAL_COMMUNITY): Payer: Self-pay | Admitting: *Deleted

## 2014-11-29 DIAGNOSIS — Z7982 Long term (current) use of aspirin: Secondary | ICD-10-CM | POA: Diagnosis not present

## 2014-11-29 DIAGNOSIS — Z803 Family history of malignant neoplasm of breast: Secondary | ICD-10-CM

## 2014-11-29 DIAGNOSIS — K219 Gastro-esophageal reflux disease without esophagitis: Secondary | ICD-10-CM | POA: Diagnosis present

## 2014-11-29 DIAGNOSIS — F419 Anxiety disorder, unspecified: Secondary | ICD-10-CM | POA: Diagnosis present

## 2014-11-29 DIAGNOSIS — Z66 Do not resuscitate: Secondary | ICD-10-CM | POA: Diagnosis present

## 2014-11-29 DIAGNOSIS — H3530 Unspecified macular degeneration: Secondary | ICD-10-CM | POA: Diagnosis present

## 2014-11-29 DIAGNOSIS — Z8249 Family history of ischemic heart disease and other diseases of the circulatory system: Secondary | ICD-10-CM

## 2014-11-29 DIAGNOSIS — I82402 Acute embolism and thrombosis of unspecified deep veins of left lower extremity: Secondary | ICD-10-CM | POA: Diagnosis present

## 2014-11-29 DIAGNOSIS — Z888 Allergy status to other drugs, medicaments and biological substances status: Secondary | ICD-10-CM | POA: Diagnosis not present

## 2014-11-29 DIAGNOSIS — Z91041 Radiographic dye allergy status: Secondary | ICD-10-CM | POA: Diagnosis not present

## 2014-11-29 DIAGNOSIS — H548 Legal blindness, as defined in USA: Secondary | ICD-10-CM | POA: Diagnosis present

## 2014-11-29 DIAGNOSIS — F329 Major depressive disorder, single episode, unspecified: Secondary | ICD-10-CM | POA: Diagnosis present

## 2014-11-29 DIAGNOSIS — R0600 Dyspnea, unspecified: Secondary | ICD-10-CM

## 2014-11-29 DIAGNOSIS — Z820 Family history of epilepsy and other diseases of the nervous system: Secondary | ICD-10-CM | POA: Diagnosis not present

## 2014-11-29 DIAGNOSIS — L03116 Cellulitis of left lower limb: Secondary | ICD-10-CM | POA: Diagnosis present

## 2014-11-29 DIAGNOSIS — E119 Type 2 diabetes mellitus without complications: Secondary | ICD-10-CM | POA: Diagnosis present

## 2014-11-29 DIAGNOSIS — M7989 Other specified soft tissue disorders: Secondary | ICD-10-CM | POA: Diagnosis present

## 2014-11-29 DIAGNOSIS — Z87891 Personal history of nicotine dependence: Secondary | ICD-10-CM | POA: Diagnosis not present

## 2014-11-29 DIAGNOSIS — Z79899 Other long term (current) drug therapy: Secondary | ICD-10-CM

## 2014-11-29 DIAGNOSIS — F0391 Unspecified dementia with behavioral disturbance: Secondary | ICD-10-CM | POA: Diagnosis present

## 2014-11-29 DIAGNOSIS — J449 Chronic obstructive pulmonary disease, unspecified: Secondary | ICD-10-CM | POA: Diagnosis present

## 2014-11-29 DIAGNOSIS — Z800 Family history of malignant neoplasm of digestive organs: Secondary | ICD-10-CM

## 2014-11-29 DIAGNOSIS — Z880 Allergy status to penicillin: Secondary | ICD-10-CM

## 2014-11-29 DIAGNOSIS — I5032 Chronic diastolic (congestive) heart failure: Secondary | ICD-10-CM | POA: Diagnosis present

## 2014-11-29 DIAGNOSIS — I82409 Acute embolism and thrombosis of unspecified deep veins of unspecified lower extremity: Secondary | ICD-10-CM | POA: Diagnosis present

## 2014-11-29 DIAGNOSIS — Z8679 Personal history of other diseases of the circulatory system: Secondary | ICD-10-CM

## 2014-11-29 DIAGNOSIS — Z881 Allergy status to other antibiotic agents status: Secondary | ICD-10-CM

## 2014-11-29 DIAGNOSIS — E039 Hypothyroidism, unspecified: Secondary | ICD-10-CM

## 2014-11-29 DIAGNOSIS — I10 Essential (primary) hypertension: Secondary | ICD-10-CM | POA: Diagnosis present

## 2014-11-29 DIAGNOSIS — D539 Nutritional anemia, unspecified: Secondary | ICD-10-CM | POA: Diagnosis present

## 2014-11-29 LAB — PROTIME-INR
INR: 1.18 (ref 0.00–1.49)
Prothrombin Time: 15.1 seconds (ref 11.6–15.2)

## 2014-11-29 LAB — GLUCOSE, CAPILLARY: Glucose-Capillary: 114 mg/dL — ABNORMAL HIGH (ref 70–99)

## 2014-11-29 LAB — CBC WITH DIFFERENTIAL/PLATELET
BASOS ABS: 0 10*3/uL (ref 0.0–0.1)
Basophils Relative: 0 % (ref 0–1)
Eosinophils Absolute: 0.7 10*3/uL (ref 0.0–0.7)
Eosinophils Relative: 5 % (ref 0–5)
HCT: 27.7 % — ABNORMAL LOW (ref 36.0–46.0)
Hemoglobin: 8.8 g/dL — ABNORMAL LOW (ref 12.0–15.0)
LYMPHS PCT: 19 % (ref 12–46)
Lymphs Abs: 2.7 10*3/uL (ref 0.7–4.0)
MCH: 34.5 pg — AB (ref 26.0–34.0)
MCHC: 31.8 g/dL (ref 30.0–36.0)
MCV: 108.6 fL — ABNORMAL HIGH (ref 78.0–100.0)
MONO ABS: 1.2 10*3/uL — AB (ref 0.1–1.0)
MONOS PCT: 8 % (ref 3–12)
Neutro Abs: 10.1 10*3/uL — ABNORMAL HIGH (ref 1.7–7.7)
Neutrophils Relative %: 68 % (ref 43–77)
PLATELETS: 286 10*3/uL (ref 150–400)
RBC: 2.55 MIL/uL — AB (ref 3.87–5.11)
RDW: 16.9 % — ABNORMAL HIGH (ref 11.5–15.5)
WBC: 14.7 10*3/uL — ABNORMAL HIGH (ref 4.0–10.5)

## 2014-11-29 LAB — URINE MICROSCOPIC-ADD ON

## 2014-11-29 LAB — BRAIN NATRIURETIC PEPTIDE: B NATRIURETIC PEPTIDE 5: 31.8 pg/mL (ref 0.0–100.0)

## 2014-11-29 LAB — URINALYSIS, ROUTINE W REFLEX MICROSCOPIC
Bilirubin Urine: NEGATIVE
GLUCOSE, UA: NEGATIVE mg/dL
Hgb urine dipstick: NEGATIVE
KETONES UR: NEGATIVE mg/dL
NITRITE: NEGATIVE
PH: 8 (ref 5.0–8.0)
Protein, ur: 30 mg/dL — AB
SPECIFIC GRAVITY, URINE: 1.031 — AB (ref 1.005–1.030)
UROBILINOGEN UA: 0.2 mg/dL (ref 0.0–1.0)

## 2014-11-29 LAB — BASIC METABOLIC PANEL
Anion gap: 8 (ref 5–15)
BUN: 13 mg/dL (ref 6–23)
CO2: 28 mmol/L (ref 19–32)
CREATININE: 0.85 mg/dL (ref 0.50–1.10)
Calcium: 8.7 mg/dL (ref 8.4–10.5)
Chloride: 104 mEq/L (ref 96–112)
GFR calc Af Amer: 70 mL/min — ABNORMAL LOW (ref >90)
GFR, EST NON AFRICAN AMERICAN: 61 mL/min — AB (ref >90)
Glucose, Bld: 176 mg/dL — ABNORMAL HIGH (ref 70–99)
Potassium: 3.9 mmol/L (ref 3.5–5.1)
Sodium: 140 mmol/L (ref 135–145)

## 2014-11-29 LAB — APTT: aPTT: 41 seconds — ABNORMAL HIGH (ref 24–37)

## 2014-11-29 LAB — POC OCCULT BLOOD, ED: Fecal Occult Bld: NEGATIVE

## 2014-11-29 MED ORDER — CALCIUM CARBONATE-VITAMIN D 500-200 MG-UNIT PO TABS
1.0000 | ORAL_TABLET | ORAL | Status: DC
  Administered 2014-11-30: 1 via ORAL
  Filled 2014-11-29: qty 1

## 2014-11-29 MED ORDER — BACLOFEN 10 MG PO TABS
10.0000 mg | ORAL_TABLET | Freq: Three times a day (TID) | ORAL | Status: DC | PRN
  Filled 2014-11-29: qty 1

## 2014-11-29 MED ORDER — LORATADINE 10 MG PO TABS
10.0000 mg | ORAL_TABLET | Freq: Every day | ORAL | Status: DC
  Administered 2014-11-30 – 2014-12-01 (×2): 10 mg via ORAL
  Filled 2014-11-29 (×2): qty 1

## 2014-11-29 MED ORDER — PANTOPRAZOLE SODIUM 40 MG PO TBEC
40.0000 mg | DELAYED_RELEASE_TABLET | ORAL | Status: DC
  Administered 2014-11-30: 40 mg via ORAL
  Filled 2014-11-29: qty 1

## 2014-11-29 MED ORDER — ONDANSETRON HCL 4 MG PO TABS: 4.0000 mg | ORAL_TABLET | Freq: Four times a day (QID) | ORAL | Status: DC | PRN

## 2014-11-29 MED ORDER — ALBUTEROL SULFATE (2.5 MG/3ML) 0.083% IN NEBU: 2.5000 mg | INHALATION_SOLUTION | Freq: Four times a day (QID) | RESPIRATORY_TRACT | Status: DC | PRN

## 2014-11-29 MED ORDER — SODIUM CHLORIDE 0.9 % IV SOLN
1500.0000 mg | INTRAVENOUS | Status: DC
  Administered 2014-11-30 (×2): 1500 mg via INTRAVENOUS
  Filled 2014-11-29 (×2): qty 1500

## 2014-11-29 MED ORDER — INSULIN ASPART 100 UNIT/ML ~~LOC~~ SOLN
0.0000 [IU] | Freq: Three times a day (TID) | SUBCUTANEOUS | Status: DC
  Administered 2014-12-01: 1 [IU] via SUBCUTANEOUS

## 2014-11-29 MED ORDER — CLINDAMYCIN HCL 300 MG PO CAPS
300.0000 mg | ORAL_CAPSULE | Freq: Once | ORAL | Status: AC
  Administered 2014-11-29: 300 mg via ORAL
  Filled 2014-11-29: qty 1

## 2014-11-29 MED ORDER — FLUTICASONE PROPIONATE 50 MCG/ACT NA SUSP
1.0000 | Freq: Every day | NASAL | Status: DC
  Administered 2014-11-30 – 2014-12-01 (×2): 1 via NASAL
  Filled 2014-11-29: qty 16

## 2014-11-29 MED ORDER — OLANZAPINE 10 MG PO TABS
10.0000 mg | ORAL_TABLET | Freq: Every day | ORAL | Status: DC
  Administered 2014-11-29 – 2014-11-30 (×2): 10 mg via ORAL
  Filled 2014-11-29 (×3): qty 1

## 2014-11-29 MED ORDER — ACETAMINOPHEN 650 MG RE SUPP: 650.0000 mg | Freq: Four times a day (QID) | RECTAL | Status: DC | PRN

## 2014-11-29 MED ORDER — RIVASTIGMINE 4.6 MG/24HR TD PT24
4.6000 mg | MEDICATED_PATCH | Freq: Every morning | TRANSDERMAL | Status: DC
  Administered 2014-11-30 – 2014-12-01 (×2): 4.6 mg via TRANSDERMAL
  Filled 2014-11-29 (×2): qty 1

## 2014-11-29 MED ORDER — ONDANSETRON HCL 4 MG/2ML IJ SOLN: 4.0000 mg | Freq: Four times a day (QID) | INTRAMUSCULAR | Status: DC | PRN

## 2014-11-29 MED ORDER — SODIUM CHLORIDE 0.9 % IJ SOLN
3.0000 mL | Freq: Two times a day (BID) | INTRAMUSCULAR | Status: DC
  Administered 2014-11-30: 3 mL via INTRAVENOUS

## 2014-11-29 MED ORDER — CLONAZEPAM 0.5 MG PO TABS
0.5000 mg | ORAL_TABLET | Freq: Two times a day (BID) | ORAL | Status: DC
  Administered 2014-11-29 – 2014-12-01 (×4): 0.5 mg via ORAL
  Filled 2014-11-29 (×4): qty 1

## 2014-11-29 MED ORDER — VITAMIN D 1000 UNITS PO TABS
5000.0000 [IU] | ORAL_TABLET | ORAL | Status: DC
  Administered 2014-11-30: 5000 [IU] via ORAL
  Filled 2014-11-29: qty 5

## 2014-11-29 MED ORDER — POTASSIUM CHLORIDE CRYS ER 20 MEQ PO TBCR
20.0000 meq | EXTENDED_RELEASE_TABLET | Freq: Every day | ORAL | Status: DC
  Administered 2014-11-30 – 2014-12-01 (×2): 20 meq via ORAL
  Filled 2014-11-29 (×2): qty 1

## 2014-11-29 MED ORDER — IPRATROPIUM-ALBUTEROL 0.5-2.5 (3) MG/3ML IN SOLN
3.0000 mL | Freq: Three times a day (TID) | RESPIRATORY_TRACT | Status: DC
  Administered 2014-11-30 – 2014-12-01 (×3): 3 mL via RESPIRATORY_TRACT
  Filled 2014-11-29 (×4): qty 3

## 2014-11-29 MED ORDER — ACETAMINOPHEN 325 MG PO TABS: 650.0000 mg | ORAL_TABLET | Freq: Four times a day (QID) | ORAL | Status: DC | PRN

## 2014-11-29 MED ORDER — ASPIRIN 325 MG PO TABS
325.0000 mg | ORAL_TABLET | Freq: Every day | ORAL | Status: DC
  Administered 2014-11-30: 325 mg via ORAL
  Filled 2014-11-29 (×2): qty 1

## 2014-11-29 MED ORDER — LOSARTAN POTASSIUM 50 MG PO TABS
50.0000 mg | ORAL_TABLET | Freq: Every day | ORAL | Status: DC
  Administered 2014-11-30 – 2014-12-01 (×2): 50 mg via ORAL
  Filled 2014-11-29 (×2): qty 1

## 2014-11-29 MED ORDER — CARVEDILOL 6.25 MG PO TABS
6.2500 mg | ORAL_TABLET | Freq: Two times a day (BID) | ORAL | Status: DC
  Administered 2014-11-29 – 2014-12-01 (×4): 6.25 mg via ORAL
  Filled 2014-11-29 (×6): qty 1

## 2014-11-29 MED ORDER — LEVOTHYROXINE SODIUM 175 MCG PO TABS
175.0000 ug | ORAL_TABLET | Freq: Every day | ORAL | Status: DC
  Administered 2014-11-30 – 2014-12-01 (×2): 175 ug via ORAL
  Filled 2014-11-29 (×3): qty 1

## 2014-11-29 MED ORDER — HEPARIN (PORCINE) IN NACL 100-0.45 UNIT/ML-% IJ SOLN
1300.0000 [IU]/h | INTRAMUSCULAR | Status: DC
  Administered 2014-11-29: 1100 [IU]/h via INTRAVENOUS
  Administered 2014-11-30: 1300 [IU]/h via INTRAVENOUS
  Filled 2014-11-29 (×3): qty 250

## 2014-11-29 MED ORDER — CYANOCOBALAMIN 500 MCG PO TABS
500.0000 ug | ORAL_TABLET | Freq: Every day | ORAL | Status: DC
  Administered 2014-11-30 – 2014-12-01 (×2): 500 ug via ORAL
  Filled 2014-11-29 (×2): qty 1

## 2014-11-29 MED ORDER — GUAIFENESIN 100 MG/5ML PO SOLN: 200.0000 mg | Freq: Four times a day (QID) | ORAL | Status: DC | PRN

## 2014-11-29 MED ORDER — DIVALPROEX SODIUM 250 MG PO DR TAB
250.0000 mg | DELAYED_RELEASE_TABLET | Freq: Three times a day (TID) | ORAL | Status: DC
  Administered 2014-11-29 – 2014-12-01 (×6): 250 mg via ORAL
  Filled 2014-11-29 (×7): qty 1

## 2014-11-29 MED ORDER — FUROSEMIDE 40 MG PO TABS
40.0000 mg | ORAL_TABLET | Freq: Every day | ORAL | Status: DC
  Administered 2014-11-30 – 2014-12-01 (×2): 40 mg via ORAL
  Filled 2014-11-29 (×2): qty 1

## 2014-11-29 MED ORDER — TRAZODONE HCL 150 MG PO TABS
150.0000 mg | ORAL_TABLET | Freq: Every day | ORAL | Status: DC
  Administered 2014-11-29 – 2014-11-30 (×2): 150 mg via ORAL
  Filled 2014-11-29 (×3): qty 1

## 2014-11-29 NOTE — ED Notes (Signed)
Bed: [id number] Expected date:  Expected time:  Means of arrival:  Comments: EMS- 81yo F, L foot swelling

## 2014-11-29 NOTE — ED Notes (Signed)
Spoke with Dr. Littie DeedsGentry regarding order for NPO and PO Cleocin, states to give PO medication at this time.

## 2014-11-29 NOTE — Progress Notes (Addendum)
CSW met with patient at bedside. Family was present. Per family, patient is from [facility]. Per note, patient is from the memory care unit. Patient confirms that she presents to [facility] because of foot swelling. Patient's left leg appeared to be swelling and red above her ankle.  Husband stated that the patient is legally blind. Also, he states that the patient is able to feed herself independently but needs assistance with all other ADL's including dressing and bathing herself.   Patient appeared to be falling asleep during parts of the interview. Patient stated that she was cold. CSW brought the patient a blanket.  Conchita Paris: [phone] Brother in [place] [phone]  Willette Brace [phone] ED CSW 11/29/2014 5:45 PM

## 2014-11-29 NOTE — Progress Notes (Signed)
VASCULAR LAB PRELIMINARY  PRELIMINARY  PRELIMINARY  PRELIMINARY  Left lower extremity venous duplex completed.    Preliminary report:  Positive for acute occlusive deep vein thrombus of the left common femoral and femoral veins. Positive for acute deep vein thrombus with minute flow in the right profunda, popliteal and posterior tibial veins. Unable to visualize the peroneal vein. Positive for acute thrombus of the proximal grreart saphenous vein into the saphenofemoral junction with minimal flow noted.  There is no evidence of propagation to the right at this time.  Virgle Arth, RVS 11/29/2014, 7:59 PM

## 2014-11-29 NOTE — Progress Notes (Signed)
Family stated that patient has a sore throat. CSW made nurse aware.  BrittneyTrish Mage Avry [name], LCSWA [phone] ED CSW 11/29/2014 6:51 PM

## 2014-11-29 NOTE — ED Notes (Signed)
US at bedside at this time 

## 2014-11-29 NOTE — Progress Notes (Signed)
Clinical Social Work Department BRIEF PSYCHOSOCIAL ASSESSMENT 11/29/2014  Patient:  Paula [name], Paula [name]     Account Number:  [id number]     Admit date:  11/29/2014  Clinical Social Worker:  Tilda Burrow, CLINICAL SOCIAL WORKER  Date/Time:  11/29/2014 10:50 PM  Referred by:  CSW  Date Referred:  11/29/2014 Referred for  ALF Placement   Other Referral:   Interview type:  Patient Other interview type:    PSYCHOSOCIAL DATA Living Status:  FACILITY Admitted from facility:   Level of care:  Assisted Living Primary support name:  Lala Lund Primary support relationship to patient:  SPOUSE Degree of support available:   Per husband, he is currently admitted into a facility. However, it is short term and only for rebab. Husband appears to be supportive.    CURRENT CONCERNS Current Concerns  Adjustment to Illness   Other Concerns:    SOCIAL WORK ASSESSMENT / PLAN CSW met with patient at bedside. Family was present. Per family, patient is from [facility]. Per note, patient is from the memory care unit. Patient confirms that she presents to [facility] because of foot swelling. Patient's left leg appeared to be swelling and red above her ankle.    Husband stated that the patient is legally blind. Also, he states that the patient is able to feed herself independently but needs assistance with all other ADL's including dressing and bathing herself. Husband appears to be a great support for patient.    Per note,  patient lives in the Memory Care Unit of the facility.    Patient appeared to be falling asleep during parts of the interview. Patient stated that she was cold. CSW brought the patient a blanket.    Conchita Paris: [phone]  Brother in Law/Jim Gerringer [phone]   Assessment/plan status:  No Further Intervention Required Other assessment/ plan:   Information/referral to community resources:    PATIENT'S/FAMILY'S RESPONSE TO PLAN OF CARE: Husband  appears to be a great support for patient. Husband was at bedside alont with the patients brother in law.     Willette Brace [phone] ED CSW 11/29/2014 11:10 PM

## 2014-11-29 NOTE — Progress Notes (Signed)
ANTICOAGULATION CONSULT NOTE - Initial Consult  Pharmacy Consult for IV heparin Indication: DVT  Allergies  Allergen Reactions  . Bactrim [Sulfamethoxazole-Trimethoprim] Anaphylaxis  . Contrast Media [Iodinated Diagnostic Agents] Anaphylaxis and Swelling  . Iodine Anaphylaxis  . Iohexol Anaphylaxis, Shortness Of Breath and Swelling     Desc: PT STATES SHE HAD A SEVERE REACTION TO IV CONRAST WITH THROAT SWELLING AND SOB. SHE WAS ADMITTED TO THE HOSPITAL. SHE HAS NEVER HAD CONTRAST AGAIN.   Marland Kitchen Sulfa Antibiotics Anaphylaxis  . Aricept [Donepezil Hcl] Nausea And Vomiting  . Cymbalta [Duloxetine Hcl] Other (See Comments)    HALLUCINATIONS    . Delsym [Dextromethorphan] Other (See Comments)    Hallucination  . Guaifenesin & Derivatives Other (See Comments)    Hallucination  . Hydromet [Hydrocodone-Homatropine] Other (See Comments)    hallucinations  . Avelox [Moxifloxacin Hcl In Nacl] Rash  . Ciprofloxacin Itching  . Gadolinium Derivatives Other (See Comments)    Unknown    . Penicillins Rash    Rash as a child  . Quinolones Rash    Patient Measurements:   Heparin Dosing Weight:   Vital Signs: Temp: 97.5 F (36.4 C) (01/19 1708) Temp Source: Oral (01/19 1708) BP: 105/45 mmHg (01/19 1708) Pulse Rate: 94 (01/19 1708)  Labs:  Recent Labs  11/29/14 1804  HGB 8.8*  HCT 27.7*  PLT 286  CREATININE 0.85    CrCl cannot be calculated (Unknown ideal weight.).   Medical History: Past Medical History  Diagnosis Date  . Depression   . Ejection fraction     EF 40%, echo, November, 2012  / in improved, EF 60%, echo, December, 2012  . Contrast media allergy     Patient feels poorly with contrast  . Status post AAA (abdominal aortic aneurysm) repair     Surgical repair, Dr. Hart [place], December 27, 2011  . Pre-syncope     vasovagal w/ heart block  . Parotid mass     has refused further eval 10/2011  . GERD (gastroesophageal reflux disease)   . Anxiety   . Facial tic    L sided spasms - Botox trial summer 2013, ?effective  . ARMD (age related macular degeneration) 06/2014    bilateral vision loss - legally blind  . AAA (abdominal aortic aneurysm)     s/p repair 12/2011  . Congestive heart failure     NL EF 10/2011 echo   . Diabetes mellitus type II, controlled   . Hypothyroidism   . Sinus bradycardia 09/19/2011    Occurring simultaneously with complete heart block   . Hypertension   . COPD (chronic obstructive pulmonary disease)   . Facial spasm     L hemifacial-treated w/ botox xeomin -Dr Terrace Arabia  . Dementia     MRI with progressive chronic microvascular ischemia and generalized cerebral atrophy  . Lumbosacral spondylosis without myelopathy   . Legally blind 06/2014    due to ARMD   Assessment: 67 yoF presents to [facility] for left leg swelling and cellulitis, found to have multiple acute DVTs. PMHx significant for AAA s/p repair 2013, CHF, T2DM, COPD, ARMD, and dementia. Pharmacy consulted to start IV heparin for VTE treatment.  Note PTA on ASA 325.    Heparin dosing weight = 74kg  1/19: Renal fxn: no issues noted, CBC: Hgb low 8.8, plts WNL.  Appears baseline hgb ~11. No signs/symptoms of bleeding per MD, waiting on results of FOBT.  Discussed with MD and will NOT bolus heparin.    Goal of  Therapy:  Heparin level 0.3-0.7 units/ml Monitor platelets by anticoagulation protocol: Yes   Plan:  IV heparin 1100 units/hr = 11 ml/hr (conservative 2/2 age, hgb).  Check 8 hour HL, f/u baseline aPTT, PT/INR.   Haynes Hoehnolleen Latalia Etzler, PharmD, BCPS 11/29/2014, 8:37 PM  Pager: [phone]

## 2014-11-29 NOTE — H&P (Signed)
[facility] History and Physical  Paula [name] BJY:[id number] DOB: 15-Sep-1927 DOA: 11/29/2014  Referring physician: ER physician. PCP: [name], MD   Chief Complaint: Left leg swelling.  HPI: Paula [name] is a 81 y.o. female history of dementia, diastolic CHF, COPD, hypothyroidism, diabetes mellitus, hypertension, was brought to the ER after patient was found to have increasing swelling and erythema of the left lower extremity over the last 2 days. Patient had some nausea but denies any abdominal pain diarrhea vomiting. Patient denies any chest pain or shortness of breath. In the ER patient on exam is found to have erythematous leg extending from the ankle to the left knee area. Dopplers done shows DVT of the left lower extremity. He has been admitted for management of left lower extremity DVT and cellulitis. Patient is also found to be anemic with hemoglobin dropping from 11-8 in one month. Stool for occult blood has been negative.  Review of Systems: As presented in the history of presenting illness, rest negative.  Past Medical History  Diagnosis Date  . Depression   . Ejection fraction     EF 40%, echo, November, 2012  / in improved, EF 60%, echo, December, 2012  . Contrast media allergy     Patient feels poorly with contrast  . Status post AAA (abdominal aortic aneurysm) repair     Surgical repair, Dr. Hart [place], December 27, 2011  . Pre-syncope     vasovagal w/ heart block  . Parotid mass     has refused further eval 10/2011  . GERD (gastroesophageal reflux disease)   . Anxiety   . Facial tic     L sided spasms - Botox trial summer 2013, ?effective  . ARMD (age related macular degeneration) 06/2014    bilateral vision loss - legally blind  . AAA (abdominal aortic aneurysm)     s/p repair 12/2011  . Congestive heart failure     NL EF 10/2011 echo   . Diabetes mellitus type II, controlled   . Hypothyroidism   . Sinus bradycardia 09/19/2011    Occurring  simultaneously with complete heart block   . Hypertension   . COPD (chronic obstructive pulmonary disease)   . Facial spasm     L hemifacial-treated w/ botox xeomin -Dr Terrace Arabia  . Dementia     MRI with progressive chronic microvascular ischemia and generalized cerebral atrophy  . Lumbosacral spondylosis without myelopathy   . Legally blind 06/2014    due to ARMD   Past Surgical History  Procedure Laterality Date  . Cholecystectomy    . Knee cartilage surgery      left  . Sinus surgery with instatrak    . Appendectomy    . Oophorectomy      ovarian cyst  . Cataract extraction      bilateral  . Bladder repair    . US echocardiography  10/14/11  . Abdominal aortic aneurysm repair  12/27/2011    ANEURYSM ABDOMINAL AORTIC REPAIR;  Surgeon: Josephina Gip, MD; Resection and Grafting Abdominal Aortic Aneurysm , Aorta Bi Iliac.  Marland Kitchen Pacemaker insertion  11/12  . Cardiac catheterization  11/04/11  . Temporary pacemaker insertion N/A 09/19/2011    Procedure: TEMPORARY PACEMAKER INSERTION;  Surgeon: Dolores Patty, MD;  Location: [facility] CATH LAB;  Service: Cardiovascular;  Laterality: N/A;  . Left heart catheterization with coronary angiogram N/A 11/04/2011    Procedure: LEFT HEART CATHETERIZATION WITH CORONARY ANGIOGRAM;  Surgeon: Laurey Morale, MD;  Location: [facility] CATH  LAB;  Service: Cardiovascular;  Laterality: N/A;   Social History:  reports that she quit smoking about 3 years ago. Her smoking use included Cigarettes. She has a 60 pack-year smoking history. She has never used smokeless tobacco. She reports that she does not drink alcohol or use illicit drugs. Where does patient live nursing home. Can patient participate in ADLs? Not sure.  Allergies  Allergen Reactions  . Bactrim [Sulfamethoxazole-Trimethoprim] Anaphylaxis  . Contrast Media [Iodinated Diagnostic Agents] Anaphylaxis and Swelling  . Iodine Anaphylaxis  . Iohexol Anaphylaxis, Shortness Of Breath and Swelling     Desc: PT STATES  SHE HAD A SEVERE REACTION TO IV CONRAST WITH THROAT SWELLING AND SOB. SHE WAS ADMITTED TO THE HOSPITAL. SHE HAS NEVER HAD CONTRAST AGAIN.   Marland Kitchen Sulfa Antibiotics Anaphylaxis  . Aricept [Donepezil Hcl] Nausea And Vomiting  . Cymbalta [Duloxetine Hcl] Other (See Comments)    HALLUCINATIONS    . Delsym [Dextromethorphan] Other (See Comments)    Hallucination  . Guaifenesin & Derivatives Other (See Comments)    Hallucination  . Hydromet [Hydrocodone-Homatropine] Other (See Comments)    hallucinations  . Sodium Benzoate [Nutritional Supplements]     Per mar  . Trimethoprim     From mar  . Avelox [Moxifloxacin Hcl In Nacl] Rash  . Ciprofloxacin Itching  . Gadolinium Derivatives Other (See Comments)    Unknown    . Penicillins Rash    Rash as a child  . Quinolones Rash    Family History:  Family History  Problem Relation Age of Onset  . Esophageal cancer Father   . Cancer Father   . Breast cancer Sister   . Cancer Sister   . Colon cancer Sister   . Heart disease Sister   . Heart disease Mother   . Heart disease Son   . Parkinson's disease Sister     younger      Prior to Admission medications   Medication Sig Start Date End Date Taking? Authorizing Provider  acetaminophen (TYLENOL) 500 MG tablet Take 500 mg by mouth every 6 (six) hours as needed (pain).   Yes Historical Provider, MD  albuterol (PROVENTIL HFA;VENTOLIN HFA) 108 (90 BASE) MCG/ACT inhaler Inhale 2 puffs into the lungs every 6 (six) hours as needed for wheezing or shortness of breath. 03/24/14  Yes Newt Lukes, MD  alum & mag hydroxide-simeth (MAALOX/MYLANTA) 200-200-20 MG/5ML suspension Take 30 mLs by mouth every 4 (four) hours as needed for indigestion or heartburn.   Yes Historical Provider, MD  baclofen (LIORESAL) 10 MG tablet Take 10 mg by mouth 3 (three) times daily as needed for muscle spasms.   Yes Historical Provider, MD  benzonatate (TESSALON) 100 MG capsule Take 200 mg by mouth at bedtime as needed  for cough.   Yes Historical Provider, MD  Calcium Carbonate-Vitamin D (CALCARB 600/D) 600-400 MG-UNIT per tablet Take 1 tablet by mouth every other day.    Yes Historical Provider, MD  carvedilol (COREG) 6.25 MG tablet Take 6.25 mg by mouth 2 (two) times daily with a meal.   Yes Historical Provider, MD  cefdinir (OMNICEF) 300 MG capsule Take 300 mg by mouth 2 (two) times daily.   Yes Historical Provider, MD  Cholecalciferol (VITAMIN D3) 5000 UNITS TABS Take 5,000 Units by mouth every other day.    Yes Historical Provider, MD  clonazePAM (KLONOPIN) 0.5 MG tablet Take 1 tablet (0.5 mg total) by mouth 2 (two) times daily. 08/18/14  Yes Vassie Loll, MD  cyanocobalamin  500 MCG tablet Take 500 mcg by mouth daily.   Yes Historical Provider, MD  divalproex (DEPAKOTE) 250 MG DR tablet Take 250 mg by mouth 3 (three) times daily.   Yes Historical Provider, MD  fluticasone (FLONASE) 50 MCG/ACT nasal spray Place 1 spray into both nostrils daily. 04/15/14  Yes Eustaquio BoydenJavier Gutierrez, MD  furosemide (LASIX) 20 MG tablet Take 2 tablets (40 mg total) by mouth daily. 08/18/14  Yes Vassie Lollarlos Madera, MD  guaifenesin (ROBITUSSIN) 100 MG/5ML syrup Take 200 mg by mouth every 6 (six) hours as needed for cough.   Yes Historical Provider, MD  ipratropium-albuterol (DUONEB) 0.5-2.5 (3) MG/3ML SOLN Take 3 mLs by nebulization 3 (three) times daily. 08/18/14  Yes Vassie Lollarlos Madera, MD  levothyroxine (SYNTHROID, LEVOTHROID) 175 MCG tablet Take 175 mcg by mouth daily before breakfast.    Yes Historical Provider, MD  loperamide (IMODIUM) 2 MG capsule Take 2 mg by mouth as needed for diarrhea or loose stools.   Yes Historical Provider, MD  loratadine (CLARITIN) 10 MG tablet Take 10 mg by mouth daily.   Yes Historical Provider, MD  losartan (COZAAR) 50 MG tablet Take 50 mg by mouth daily.   Yes Historical Provider, MD  magnesium hydroxide (MILK OF MAGNESIA) 400 MG/5ML suspension Take 30 mLs by mouth daily as needed for mild constipation.   Yes  Historical Provider, MD  metFORMIN (GLUCOPHAGE-XR) 500 MG 24 hr tablet Take 500 mg by mouth daily with breakfast.  12/08/13  Yes Newt LukesValerie A Leschber, MD  Neomycin-Bacitracin-Polymyxin (TRIPLE ANTIBIOTIC EX) Apply topically as needed (minor skin tears or abrasions.).   Yes Historical Provider, MD  OLANZapine (ZYPREXA) 10 MG tablet Take 10 mg by mouth at bedtime.   Yes Historical Provider, MD  pantoprazole (PROTONIX) 40 MG tablet Take 40 mg by mouth every other day.    Yes Historical Provider, MD  Polyethyl Glycol-Propyl Glycol (SYSTANE) 0.4-0.3 % SOLN Apply 1 drop to eye 2 (two) times daily as needed (dry eyes).   Yes Historical Provider, MD  potassium chloride SA (K-DUR,KLOR-CON) 20 MEQ tablet Take 20 mEq by mouth daily.   Yes Historical Provider, MD  rivastigmine (EXELON) 4.6 mg/24hr Place 4.6 mg onto the skin every morning.   Yes Historical Provider, MD  traZODone (DESYREL) 150 MG tablet Take 150 mg by mouth at bedtime.   Yes Historical Provider, MD  aspirin 325 MG tablet Take 325 mg by mouth daily.    Historical Provider, MD  cyanocobalamin 500 MCG tablet Take 500 mcg by mouth daily.    Historical Provider, MD  divalproex (DEPAKOTE) 125 MG DR tablet Take 2 tablets (250 mg total) by mouth 3 (three) times daily with meals. Patient not taking: Reported on 11/29/2014 08/18/14   Vassie Lollarlos Madera, MD  fexofenadine (ALLEGRA) 180 MG tablet Take 1 tablet (180 mg total) by mouth daily. Patient not taking: Reported on 11/29/2014 04/25/14   Eustaquio BoydenJavier Gutierrez, MD  haloperidol (HALDOL) 0.5 MG tablet Take 1 tablet (0.5 mg total) by mouth every 8 (eight) hours as needed for agitation. Patient not taking: Reported on 11/29/2014 08/18/14   Vassie Lollarlos Madera, MD  losartan-hydrochlorothiazide [facility](HYZAAR) 100-12.5 MG per tablet Take 1 tablet by mouth 2 (two) times daily.     Historical Provider, MD  nitrofurantoin, macrocrystal-monohydrate, (MACROBID) 100 MG capsule Take 1 capsule (100 mg total) by mouth 2 (two) times daily. X 7  days Patient not taking: Reported on 11/29/2014 10/15/14   Joni ReiningNicole Pisciotta, PA-C  OLANZapine (ZYPREXA) 5 MG tablet Take 5 mg by mouth  2 (two) times daily.    Historical Provider, MD    Physical Exam: Filed Vitals:   11/29/14 2000 11/29/14 2024 11/29/14 2100 11/29/14 2200  BP: 109/45  130/45 115/64  Pulse: 94  93 74  Temp:   99.1 F (37.3 C)   TempSrc:   Rectal   Resp:   36 20  Height:  5\' 5"  (1.651 m)    Weight:  80.287 kg (177 lb)    SpO2: 90%  94% 94%     General:  Moderately built and nourished.  Eyes: Anicteric no pallor.  ENT: No discharge from the ears eyes nose more.  Neck: No mass felt.  Cardiovascular: S1-S2 heard.  Respiratory: No rhonchi or crepitations.  Abdomen: Soft nontender bowel sounds present.  Skin: Erythema extending from the left ankle to the knee with swelling. Patient is able to flex the knee but has some pain. Pulses felt.  Musculoskeletal: See skin description.  Psychiatric: Appears normal at this time.  Neurologic: Alert awake oriented to time place and person. Moves all extremities.  Labs on Admission:  Basic Metabolic Panel:  Recent Labs Lab 11/29/14 1804  NA 140  K 3.9  CL 104  CO2 28  GLUCOSE 176*  BUN 13  CREATININE 0.85  CALCIUM 8.7   Liver Function Tests: No results for input(s): AST, ALT, ALKPHOS, BILITOT, PROT, ALBUMIN in the last 168 hours. No results for input(s): LIPASE, AMYLASE in the last 168 hours. No results for input(s): AMMONIA in the last 168 hours. CBC:  Recent Labs Lab 11/29/14 1804  WBC 14.7*  NEUTROABS 10.1*  HGB 8.8*  HCT 27.7*  MCV 108.6*  PLT 286   Cardiac Enzymes: No results for input(s): CKTOTAL, CKMB, CKMBINDEX, TROPONINI in the last 168 hours.  BNP (last 3 results)  Recent Labs  03/22/14 1054 08/15/14 1735  PROBNP 183.0* 171.4   CBG: No results for input(s): GLUCAP in the last 168 hours.  Radiological Exams on Admission: No results found.   Assessment/Plan Principal  Problem:   Cellulitis of left lower extremity Active Problems:   Hypothyroidism   Chronic diastolic CHF (congestive heart failure)   DVT (deep venous thrombosis)   Macrocytic anemia   Diabetes mellitus type 2, controlled   1. Cellulitis of the left lower extremity - patient has been placed on vancomycin and since patient is also diabetic will add ciprofloxacin. Closely observe. 2. DVT of the left lower extremity - since patient is anemic and a drop of hemoglobin from 11-8 in 1 month patient was started on heparin. If there is no further decrease in hemoglobin then may change to Lovenox oral anticoagulants. 3. Macrocytic anemia - stool for occult blood has been negative. Check anemia panel. Follow CBC closely. See #2. 4. Diabetes mellitus type 2 controlled - patient was on metformin which will be held during hospitalization and we will place patient on sliding scale coverage. 5. Hypertension - continue home medication. 6. Chronic diastolic heart failure appears compensated last EF measured was 55% last October 2015 - continue Lasix. Closely follow intake output and metabolic panel and daily weights. 7. COPD - presently not wheezing. 8. Dementia - no acute issues. Continue home medications. 9. Hypothyroidism - continue Synthroid.   DVT Prophylaxis on heparin.  Code Status: DO NOT RESUSCITATE.  Family Communication: None.  Disposition Plan: Admit to inpatient.    Paislynn Hegstrom N. [facility] Pager [phone].  If 7PM-7AM, please contact night-coverage [email or web address] Password [facility] 11/29/2014, 10:23 PM

## 2014-11-29 NOTE — ED Notes (Signed)
[facility], redness x 2 weeks ago. A doctor came to facility and gave antibiotics and said they'd check back in two weeks. Family concerned and wanting pt treated sooner.

## 2014-11-29 NOTE — Progress Notes (Signed)
ANTIBIOTIC CONSULT NOTE - INITIAL  Pharmacy Consult for Vancomycin Indication: Cellulitis  Allergies  Allergen Reactions  . Bactrim [Sulfamethoxazole-Trimethoprim] Anaphylaxis  . Contrast Media [Iodinated Diagnostic Agents] Anaphylaxis and Swelling  . Iodine Anaphylaxis  . Iohexol Anaphylaxis, Shortness Of Breath and Swelling     Desc: PT STATES SHE HAD A SEVERE REACTION TO IV CONRAST WITH THROAT SWELLING AND SOB. SHE WAS ADMITTED TO THE HOSPITAL. SHE HAS NEVER HAD CONTRAST AGAIN.   Marland Kitchen. Sulfa Antibiotics Anaphylaxis  . Aricept [Donepezil Hcl] Nausea And Vomiting  . Cymbalta [Duloxetine Hcl] Other (See Comments)    HALLUCINATIONS    . Delsym [Dextromethorphan] Other (See Comments)    Hallucination  . Guaifenesin & Derivatives Other (See Comments)    Hallucination  . Hydromet [Hydrocodone-Homatropine] Other (See Comments)    hallucinations  . Sodium Benzoate [Nutritional Supplements]     Per mar  . Trimethoprim     From mar  . Avelox [Moxifloxacin Hcl In Nacl] Rash  . Ciprofloxacin Itching  . Gadolinium Derivatives Other (See Comments)    Unknown    . Penicillins Rash    Rash as a child  . Quinolones Rash    Patient Measurements: Height: 5\' 5"  (165.1 cm) Weight: 177 lb (80.287 kg) IBW/kg (Calculated) : 57  Vital Signs: Temp: 99.1 F (37.3 C) (01/19 2100) Temp Source: Rectal (01/19 2100) BP: 115/64 mmHg (01/19 2200) Pulse Rate: 74 (01/19 2200) Intake/Output from previous day:   Intake/Output from this shift:    Labs:  Recent Labs  11/29/14 1804  WBC 14.7*  HGB 8.8*  PLT 286  CREATININE 0.85   Estimated Creatinine Clearance: 50.6 mL/min (by C-G formula based on Cr of 0.85). No results for input(s): VANCOTROUGH, VANCOPEAK, VANCORANDOM, GENTTROUGH, GENTPEAK, GENTRANDOM, TOBRATROUGH, TOBRAPEAK, TOBRARND, AMIKACINPEAK, AMIKACINTROU, AMIKACIN in the last 72 hours.   Microbiology: No results found for this or any previous visit (from the past 720  hour(s)).  Medical History: Past Medical History  Diagnosis Date  . Depression   . Ejection fraction     EF 40%, echo, November, 2012  / in improved, EF 60%, echo, December, 2012  . Contrast media allergy     Patient feels poorly with contrast  . Status post AAA (abdominal aortic aneurysm) repair     Surgical repair, Dr. Hart RochesterLawson, December 27, 2011  . Pre-syncope     vasovagal w/ heart block  . Parotid mass     has refused further eval 10/2011  . GERD (gastroesophageal reflux disease)   . Anxiety   . Facial tic     L sided spasms - Botox trial summer 2013, ?effective  . ARMD (age related macular degeneration) 06/2014    bilateral vision loss - legally blind  . AAA (abdominal aortic aneurysm)     s/p repair 12/2011  . Congestive heart failure     NL EF 10/2011 echo   . Diabetes mellitus type II, controlled   . Hypothyroidism   . Sinus bradycardia 09/19/2011    Occurring simultaneously with complete heart block   . Hypertension   . COPD (chronic obstructive pulmonary disease)   . 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 Dementia     MRI with progressive chronic microvascular ischemia and generalized cerebral atrophy  . Lumbosacral spondylosis without myelopathy   . Legally blind 06/2014    due to ARMD    Medications:  Scheduled:  . [START ON 11/30/2014] aspirin  325 mg Oral  Daily  . [START ON 11/30/2014] calcium-vitamin D  1 tablet Oral QODAY  . carvedilol  6.25 mg Oral BID WC  . [START ON 11/30/2014] cholecalciferol  5,000 Units Oral QODAY  . clonazePAM  0.5 mg Oral BID  . [START ON 11/30/2014] cyanocobalamin  500 mcg Oral Daily  . divalproex  250 mg Oral TID  . [START ON 11/30/2014] fluticasone  1 spray Each Nare Daily  . [START ON 11/30/2014] furosemide  40 mg Oral Daily  . [START ON 11/30/2014] insulin aspart  0-9 Units Subcutaneous TID WC  . ipratropium-albuterol  3 mL Nebulization TID  . [START ON 11/30/2014] levothyroxine  175 mcg Oral QAC  breakfast  . [START ON 11/30/2014] loratadine  10 mg Oral Daily  . [START ON 11/30/2014] losartan  50 mg Oral Daily  . OLANZapine  10 mg Oral QHS  . [START ON 11/30/2014] pantoprazole  40 mg Oral QODAY  . [START ON 11/30/2014] potassium chloride SA  20 mEq Oral Daily  . [START ON 11/30/2014] rivastigmine  4.6 mg Transdermal q morning - 10a  . sodium chloride  3 mL Intravenous Q12H  . traZODone  150 mg Oral QHS  . vancomycin  1,500 mg Intravenous Q24H   Infusions:  . heparin 1,100 Units/hr (11/29/14 2110)   Assessment:  81 yr female with left foot swelling, redness.  Pt known to pharmacy from dosing of heparin earlier today for + DVT.  Received Clindamycin  PO x 1 @ 20:44  Pharmacy consulted to dose IV Vancomycin for treatment of cellulitis of lower extremity  CrCl ~ 50 ml/min  Goal of Therapy:  Vancomycin trough level 10-15 mcg/ml  Plan:  Measure antibiotic drug levels at steady state Follow up culture results  Vancomycin  IV q24h  Jaleya Pebley, Joselyn Glassman, PharmD 11/29/2014,10:41 PM

## 2014-11-29 NOTE — ED Provider Notes (Signed)
CSN: [id number]     Arrival date & time 11/29/14  1703 History   First MD Initiated Contact with Patient 11/29/14 1728     Chief Complaint  Patient presents with  . Cellulitis     (Consider location/radiation/quality/duration/timing/severity/associated sxs/prior Treatment) Patient is a 81 y.o. female presenting with leg pain.  Leg Pain Location:  Leg Time since incident:  3 days Injury: no   Leg location:  L lower leg Pain details:    Quality:  Aching   Severity:  Mild   Onset quality:  Gradual   Duration:  3 days   Timing:  Constant   Progression:  Worsening Chronicity:  New Relieved by:  Nothing Associated symptoms: no fever   Associated symptoms comment:  Nausea, no vomiting   Past Medical History  Diagnosis Date  . Depression   . Ejection fraction     EF 40%, echo, November, 2012  / in improved, EF 60%, echo, December, 2012  . Contrast media allergy     Patient feels poorly with contrast  . Status post AAA (abdominal aortic aneurysm) repair     Surgical repair, Dr. Hart Rochester, December 27, 2011  . Pre-syncope     vasovagal w/ heart block  . Parotid mass     has refused further eval 10/2011  . GERD (gastroesophageal reflux disease)   . Anxiety   . Facial tic     L sided spasms - Botox trial summer 2013, ?effective  . ARMD (age related macular degeneration) 06/2014    bilateral vision loss - legally blind  . AAA (abdominal aortic aneurysm)     s/p repair 12/2011  . Congestive heart failure     NL EF 10/2011 echo   . Diabetes mellitus type II, controlled   . Hypothyroidism   . Sinus bradycardia 09/19/2011    Occurring simultaneously with complete heart block   . Hypertension   . COPD (chronic obstructive pulmonary disease)   . Facial spasm     L hemifacial-treated w/ botox xeomin -Dr Terrace Arabia  . Dementia     MRI with progressive chronic microvascular ischemia and generalized cerebral atrophy  . Lumbosacral spondylosis without myelopathy   . Legally blind 06/2014     due to ARMD   Past Surgical History  Procedure Laterality Date  . Cholecystectomy    . Knee cartilage surgery      left  . Sinus surgery with instatrak    . Appendectomy    . Oophorectomy      ovarian cyst  . Cataract extraction      bilateral  . Bladder repair    . US echocardiography  10/14/11  . Abdominal aortic aneurysm repair  12/27/2011    ANEURYSM ABDOMINAL AORTIC REPAIR;  Surgeon: Josephina Gip, MD; Resection and Grafting Abdominal Aortic Aneurysm , Aorta Bi Iliac.  Marland Kitchen Pacemaker insertion  11/12  . Cardiac catheterization  11/04/11  . Temporary pacemaker insertion N/A 09/19/2011    Procedure: TEMPORARY PACEMAKER INSERTION;  Surgeon: Dolores Patty, MD;  Location: [facility] CATH LAB;  Service: Cardiovascular;  Laterality: N/A;  . Left heart catheterization with coronary angiogram N/A 11/04/2011    Procedure: LEFT HEART CATHETERIZATION WITH CORONARY ANGIOGRAM;  Surgeon: Laurey Morale, MD;  Location: [facility] CATH LAB;  Service: Cardiovascular;  Laterality: N/A;   Family History  Problem Relation Age of Onset  . Esophageal cancer Father   . Cancer Father   . Breast cancer Sister   . Cancer Sister   .  Colon cancer Sister   . Heart disease Sister   . Heart disease Mother   . Heart disease Son   . Parkinson's disease Sister     younger   History  Substance Use Topics  . Smoking status: Former Smoker -- 1.00 packs/day for 60 years    Types: Cigarettes    Quit date: 08/31/2011  . Smokeless tobacco: Never Used  . Alcohol Use: No   OB History    No data available     Review of Systems  Constitutional: Negative for fever.  All other systems reviewed and are negative.     Allergies  Bactrim; Contrast media; Iodine; Iohexol; Sulfa antibiotics; Aricept; Cymbalta; Delsym; Guaifenesin & derivatives; Hydromet; Sodium benzoate; Trimethoprim; Avelox; Ciprofloxacin; Gadolinium derivatives; Penicillins; and Quinolones  Home Medications   Prior to Admission medications    Medication Sig Start Date End Date Taking? Authorizing Provider  acetaminophen (TYLENOL) 500 MG tablet Take 500 mg by mouth every 6 (six) hours as needed (pain).   Yes Historical Provider, MD  albuterol (PROVENTIL HFA;VENTOLIN HFA) 108 (90 BASE) MCG/ACT inhaler Inhale 2 puffs into the lungs every 6 (six) hours as needed for wheezing or shortness of breath. 03/24/14  Yes Newt LukesValerie A Leschber, MD  alum & mag hydroxide-simeth (MAALOX/MYLANTA) 200-200-20 MG/5ML suspension Take 30 mLs by mouth every 4 (four) hours as needed for indigestion or heartburn.   Yes Historical Provider, MD  baclofen (LIORESAL) 10 MG tablet Take 10 mg by mouth 3 (three) times daily as needed for muscle spasms.   Yes Historical Provider, MD  benzonatate (TESSALON) 100 MG capsule Take 200 mg by mouth at bedtime as needed for cough.   Yes Historical Provider, MD  Calcium Carbonate-Vitamin D (CALCARB 600/D) 600-400 MG-UNIT per tablet Take 1 tablet by mouth every other day.    Yes Historical Provider, MD  carvedilol (COREG) 6.25 MG tablet Take 6.25 mg by mouth 2 (two) times daily with a meal.   Yes Historical Provider, MD  Cholecalciferol (VITAMIN D3) 5000 UNITS TABS Take 5,000 Units by mouth every other day.    Yes Historical Provider, MD  cyanocobalamin 500 MCG tablet Take 500 mcg by mouth daily.   Yes Historical Provider, MD  divalproex (DEPAKOTE) 250 MG DR tablet Take 250 mg by mouth 3 (three) times daily.   Yes Historical Provider, MD  fluticasone (FLONASE) 50 MCG/ACT nasal spray Place 1 spray into both nostrils daily. 04/15/14  Yes Eustaquio BoydenJavier Gutierrez, MD  furosemide (LASIX) 20 MG tablet Take 2 tablets (40 mg total) by mouth daily. 08/18/14  Yes Vassie Lollarlos Madera, MD  guaifenesin (ROBITUSSIN) 100 MG/5ML syrup Take 200 mg by mouth every 6 (six) hours as needed for cough.   Yes Historical Provider, MD  ipratropium-albuterol (DUONEB) 0.5-2.5 (3) MG/3ML SOLN Take 3 mLs by nebulization 3 (three) times daily. 08/18/14  Yes Vassie Lollarlos Madera, MD   levothyroxine (SYNTHROID, LEVOTHROID) 175 MCG tablet Take 175 mcg by mouth daily before breakfast.    Yes Historical Provider, MD  loperamide (IMODIUM) 2 MG capsule Take 2 mg by mouth as needed for diarrhea or loose stools.   Yes Historical Provider, MD  loratadine (CLARITIN) 10 MG tablet Take 10 mg by mouth daily.   Yes Historical Provider, MD  losartan (COZAAR) 50 MG tablet Take 50 mg by mouth daily.   Yes Historical Provider, MD  magnesium hydroxide (MILK OF MAGNESIA) 400 MG/5ML suspension Take 30 mLs by mouth daily as needed for mild constipation.   Yes Historical Provider, MD  metFORMIN (GLUCOPHAGE-XR) 500 MG 24 hr tablet Take 500 mg by mouth daily with breakfast.  12/08/13  Yes Newt Lukes, MD  Neomycin-Bacitracin-Polymyxin (TRIPLE ANTIBIOTIC EX) Apply topically as needed (minor skin tears or abrasions.).   Yes Historical Provider, MD  OLANZapine (ZYPREXA) 10 MG tablet Take 10 mg by mouth at bedtime.   Yes Historical Provider, MD  pantoprazole (PROTONIX) 40 MG tablet Take 40 mg by mouth every other day.    Yes Historical Provider, MD  Polyethyl Glycol-Propyl Glycol (SYSTANE) 0.4-0.3 % SOLN Apply 1 drop to eye 2 (two) times daily as needed (dry eyes).   Yes Historical Provider, MD  potassium chloride SA (K-DUR,KLOR-CON) 20 MEQ tablet Take 20 mEq by mouth daily.   Yes Historical Provider, MD  rivastigmine (EXELON) 4.6 mg/24hr Place 4.6 mg onto the skin every morning.   Yes Historical Provider, MD  traZODone (DESYREL) 150 MG tablet Take 150 mg by mouth at bedtime.   Yes Historical Provider, MD  apixaban (ELIQUIS) 5 MG TABS tablet Take 2 tablets (10 mg total) by mouth 2 (two) times daily. 12/01/14 12/07/14  Vassie Loll, MD  apixaban (ELIQUIS) 5 MG TABS tablet Take 1 tablet (5 mg total) by mouth 2 (two) times daily. 12/08/14   Vassie Loll, MD  clonazePAM (KLONOPIN) 0.5 MG tablet Take 1 tablet (0.5 mg total) by mouth 2 (two) times daily as needed for anxiety. 12/01/14   Vassie Loll, MD   divalproex (DEPAKOTE) 125 MG DR tablet Take 2 tablets (250 mg total) by mouth 3 (three) times daily with meals. Patient not taking: Reported on 11/29/2014 08/18/14   Vassie Loll, MD  doxycycline (VIBRA-TABS) 100 MG tablet Take 1 tablet (100 mg total) by mouth every 12 (twelve) hours. 12/01/14   Vassie Loll, MD  fexofenadine (ALLEGRA) 180 MG tablet Take 1 tablet (180 mg total) by mouth daily. Patient not taking: Reported on 11/29/2014 04/25/14   [name], MD  haloperidol (HALDOL) 0.5 MG tablet Take 1 tablet (0.5 mg total) by mouth every 8 (eight) hours as needed for agitation. Patient not taking: Reported on 11/29/2014 08/18/14   Vassie Loll, MD  OLANZapine (ZYPREXA) 5 MG tablet Take 5 mg by mouth 2 (two) times daily.    Historical Provider, MD   BP 111/46 mmHg  Pulse 87  Temp(Src) 96.2 F (35.7 C) (Axillary)  Resp 23  Ht 5\' 5"  (1.651 m)  Wt 178 lb 2.1 oz (80.8 kg)  BMI 29.64 kg/m2  SpO2 98% Physical Exam  Constitutional: She is oriented to person, place, and time. She appears well-developed and well-nourished.  HENT:  Head: Normocephalic and atraumatic.  Right Ear: External ear normal.  Left Ear: External ear normal.  Eyes: Conjunctivae and EOM are normal. Pupils are equal, round, and reactive to light.  Neck: Normal range of motion. Neck supple.  Cardiovascular: Normal rate, regular rhythm, normal heart sounds and intact distal pulses.   Pulmonary/Chest: Effort normal and breath sounds normal.  Abdominal: Soft. Bowel sounds are normal. There is no tenderness.  Musculoskeletal: Normal range of motion.       Right ankle: She exhibits normal pulse.       Left ankle: She exhibits abnormal pulse (1+).       Left lower leg: She exhibits tenderness, swelling and edema (with well circumscribed erythematous patch over shin).  Neurological: She is alert and oriented to person, place, and time.  Skin: Skin is warm and dry.  Blanching erythematous, well demarcated rash over L shin  with  swelling throughout entirety of L leg, 1+ pulse (2+ on right)  Vitals reviewed.   ED Course  Procedures (including critical care time) Labs Review Labs Reviewed  CBC WITH DIFFERENTIAL - Abnormal; Notable for the following:    WBC 14.7 (*)    RBC 2.55 (*)    Hemoglobin 8.8 (*)    HCT 27.7 (*)    MCV 108.6 (*)    MCH 34.5 (*)    RDW 16.9 (*)    Neutro Abs 10.1 (*)    Monocytes Absolute 1.2 (*)    All other components within normal limits  BASIC METABOLIC PANEL - Abnormal; Notable for the following:    Glucose, Bld 176 (*)    GFR calc non Af Amer 61 (*)    GFR calc Af Amer 70 (*)    All other components within normal limits  APTT - Abnormal; Notable for the following:    aPTT 41 (*)    All other components within normal limits  HEPARIN LEVEL (UNFRACTIONATED) - Abnormal; Notable for the following:    Heparin Unfractionated 0.18 (*)    All other components within normal limits  URINALYSIS, ROUTINE W REFLEX MICROSCOPIC - Abnormal; Notable for the following:    APPearance TURBID (*)    Specific Gravity, Urine 1.031 (*)    Protein, ur 30 (*)    Leukocytes, UA LARGE (*)    All other components within normal limits  COMPREHENSIVE METABOLIC PANEL - Abnormal; Notable for the following:    Glucose, Bld 113 (*)    Total Protein 5.4 (*)    Albumin 2.9 (*)    GFR calc non Af Amer 75 (*)    GFR calc Af Amer 87 (*)    All other components within normal limits  CBC WITH DIFFERENTIAL - Abnormal; Notable for the following:    WBC 12.9 (*)    RBC 2.36 (*)    Hemoglobin 8.1 (*)    HCT 25.7 (*)    MCV 108.9 (*)    MCH 34.3 (*)    RDW 17.0 (*)    Monocytes Absolute 1.3 (*)    Eosinophils Relative 6 (*)    Eosinophils Absolute 0.8 (*)    All other components within normal limits  URINE MICROSCOPIC-ADD ON - Abnormal; Notable for the following:    Bacteria, UA MANY (*)    All other components within normal limits  GLUCOSE, CAPILLARY - Abnormal; Notable for the following:     Glucose-Capillary 114 (*)    All other components within normal limits  VITAMIN B12 - Abnormal; Notable for the following:    Vitamin B-12 926 (*)    All other components within normal limits  RETICULOCYTES - Abnormal; Notable for the following:    RBC. 2.42 (*)    All other components within normal limits  GLUCOSE, CAPILLARY - Abnormal; Notable for the following:    Glucose-Capillary 104 (*)    All other components within normal limits  GLUCOSE, CAPILLARY - Abnormal; Notable for the following:    Glucose-Capillary 118 (*)    All other components within normal limits  GLUCOSE, CAPILLARY - Abnormal; Notable for the following:    Glucose-Capillary 116 (*)    All other components within normal limits  HEPARIN LEVEL (UNFRACTIONATED) - Abnormal; Notable for the following:    Heparin Unfractionated 0.29 (*)    All other components within normal limits  GLUCOSE, CAPILLARY - Abnormal; Notable for the following:    Glucose-Capillary 111 (*)  All other components within normal limits  CBC - Abnormal; Notable for the following:    WBC 12.9 (*)    RBC 2.39 (*)    Hemoglobin 8.2 (*)    HCT 25.9 (*)    MCV 108.4 (*)    MCH 34.3 (*)    RDW 16.9 (*)    All other components within normal limits  GLUCOSE, CAPILLARY - Abnormal; Notable for the following:    Glucose-Capillary 104 (*)    All other components within normal limits  BRAIN NATRIURETIC PEPTIDE  PROTIME-INR  FOLATE  IRON AND TIBC  FERRITIN  GLUCOSE, CAPILLARY  POC OCCULT BLOOD, ED    Imaging Review No results found.   EKG Interpretation None     CRITICAL CARE Performed by: Mirian Mo   Total critical care time: 35 min  Critical care time was exclusive of separately billable procedures and treating other patients.  Critical care was necessary to treat or prevent imminent or life-threatening deterioration.  Critical care was time spent personally by me on the following activities: development of treatment plan  with patient and/or surrogate as well as nursing, discussions with consultants, evaluation of patient's response to treatment, examination of patient, obtaining history from patient or surrogate, ordering and performing treatments and interventions, ordering and review of laboratory studies, ordering and review of radiographic studies, pulse oximetry and re-evaluation of patient's condition.  MDM   Final diagnoses:  Dyspnea    81 y.o. female with pertinent PMH of COPD, AAA sp repair, CHF, DM, dementia presents with L leg swelling, rapidly progressive over the last 2 days.  She has had nausea, but no other systemic symptoms.  Exam consistent with cellulitis, however there is a large amount of swelling throughout the entirety of the leg.  Pulses intact, but diminished on L.  Will obtain DVT study.    DVT study with large partially occlusive thrombus.  Pt admitted in stable condition after heparin.  I have reviewed all laboratory and imaging studies if ordered as above  1. Dyspnea         Mirian Mo, MD 12/02/14 [phone]

## 2014-11-30 DIAGNOSIS — I5032 Chronic diastolic (congestive) heart failure: Secondary | ICD-10-CM

## 2014-11-30 DIAGNOSIS — I82402 Acute embolism and thrombosis of unspecified deep veins of left lower extremity: Secondary | ICD-10-CM

## 2014-11-30 DIAGNOSIS — L03116 Cellulitis of left lower limb: Principal | ICD-10-CM

## 2014-11-30 LAB — IRON AND TIBC
IRON: 64 ug/dL (ref 42–145)
Saturation Ratios: 25 % (ref 20–55)
TIBC: 255 ug/dL (ref 250–470)
UIBC: 191 ug/dL (ref 125–400)

## 2014-11-30 LAB — GLUCOSE, CAPILLARY
GLUCOSE-CAPILLARY: 118 mg/dL — AB (ref 70–99)
GLUCOSE-CAPILLARY: 111 mg/dL — AB (ref 70–99)
Glucose-Capillary: 104 mg/dL — ABNORMAL HIGH (ref 70–99)
Glucose-Capillary: 116 mg/dL — ABNORMAL HIGH (ref 70–99)

## 2014-11-30 LAB — COMPREHENSIVE METABOLIC PANEL
ALT: 9 U/L (ref 0–35)
AST: 15 U/L (ref 0–37)
Albumin: 2.9 g/dL — ABNORMAL LOW (ref 3.5–5.2)
Alkaline Phosphatase: 45 U/L (ref 39–117)
Anion gap: 7 (ref 5–15)
BILIRUBIN TOTAL: 0.7 mg/dL (ref 0.3–1.2)
BUN: 13 mg/dL (ref 6–23)
CO2: 30 mmol/L (ref 19–32)
CREATININE: 0.76 mg/dL (ref 0.50–1.10)
Calcium: 8.7 mg/dL (ref 8.4–10.5)
Chloride: 105 mEq/L (ref 96–112)
GFR calc Af Amer: 87 mL/min — ABNORMAL LOW (ref >90)
GFR, EST NON AFRICAN AMERICAN: 75 mL/min — AB (ref >90)
Glucose, Bld: 113 mg/dL — ABNORMAL HIGH (ref 70–99)
Potassium: 3.7 mmol/L (ref 3.5–5.1)
Sodium: 142 mmol/L (ref 135–145)
Total Protein: 5.4 g/dL — ABNORMAL LOW (ref 6.0–8.3)

## 2014-11-30 LAB — CBC WITH DIFFERENTIAL/PLATELET
BASOS PCT: 1 % (ref 0–1)
Basophils Absolute: 0.1 10*3/uL (ref 0.0–0.1)
EOS ABS: 0.8 10*3/uL — AB (ref 0.0–0.7)
Eosinophils Relative: 6 % — ABNORMAL HIGH (ref 0–5)
HCT: 25.7 % — ABNORMAL LOW (ref 36.0–46.0)
Hemoglobin: 8.1 g/dL — ABNORMAL LOW (ref 12.0–15.0)
Lymphocytes Relative: 27 % (ref 12–46)
Lymphs Abs: 3.4 10*3/uL (ref 0.7–4.0)
MCH: 34.3 pg — AB (ref 26.0–34.0)
MCHC: 31.5 g/dL (ref 30.0–36.0)
MCV: 108.9 fL — ABNORMAL HIGH (ref 78.0–100.0)
MONOS PCT: 10 % (ref 3–12)
Monocytes Absolute: 1.3 10*3/uL — ABNORMAL HIGH (ref 0.1–1.0)
Neutro Abs: 7.4 10*3/uL (ref 1.7–7.7)
Neutrophils Relative %: 56 % (ref 43–77)
Platelets: 253 10*3/uL (ref 150–400)
RBC: 2.36 MIL/uL — ABNORMAL LOW (ref 3.87–5.11)
RDW: 17 % — ABNORMAL HIGH (ref 11.5–15.5)
WBC: 12.9 10*3/uL — ABNORMAL HIGH (ref 4.0–10.5)

## 2014-11-30 LAB — RETICULOCYTES
RBC.: 2.42 MIL/uL — AB (ref 3.87–5.11)
RETIC COUNT ABSOLUTE: 46 10*3/uL (ref 19.0–186.0)
RETIC CT PCT: 1.9 % (ref 0.4–3.1)

## 2014-11-30 LAB — HEPARIN LEVEL (UNFRACTIONATED)
HEPARIN UNFRACTIONATED: 0.18 [IU]/mL — AB (ref 0.30–0.70)
Heparin Unfractionated: 0.29 IU/mL — ABNORMAL LOW (ref 0.30–0.70)

## 2014-11-30 LAB — FOLATE: Folate: 9.6 ng/mL

## 2014-11-30 LAB — FERRITIN: FERRITIN: 176 ng/mL (ref 10–291)

## 2014-11-30 LAB — VITAMIN B12: VITAMIN B 12: 926 pg/mL — AB (ref 211–911)

## 2014-11-30 NOTE — Progress Notes (Addendum)
[facility] PROGRESS NOTE  Paula LarsenSusan R [name] ZOX:[id number] DOB: [birth year] DOA: 11/29/2014 PCP: Eustaquio BoydenJavier Gutierrez, MD  Brief narrative 81 year old female with history of diastolic CHF, hypertension, COPD, hypothyroidism, dementia presenting with left leg swelling with findings of cellulitis and DVT  Assessment/Plan: DVT of left lower extremity (left common femoral vein) No clear etiology except for poor mobility at that  could have contributed. On IV heparin drip secondary to drop in hemoglobin from 11 few months back to 8.1. Stool for  occult blood negative. Monitor for the next 24 hours and if stable can transition to oral Xarelto or eliquis. Need to treat for at least 6 months. PT eval.  Left lower leg cellulitis Continue empiric vancomycin. Monitor WBC  Anemia Drop in H&H noted to 8 from baseline of 11 few months ago. No signs of bleeding. Monitor closely  Type 2 diabetes mellitus Holding metformin. Continue sliding scale insulin  Hypertension Continue home medications  Chronic diastolic CHF Appears euvolemic. Continue Lasix  COPD Stable. Continue home inhaler  Dementia Appears stable. Again exelon patch   DVT prophylaxis: IV heparin Diet: Heart healthy   Code Status: DO NOT RESUSCITATE Family Communication: None at bedside Disposition Plan: Home likely in the next 48 hours if clinically improved   Consultants:  None  Procedures:  Doppler left lower extremity  Antibiotics:  IV vancomycin since 1/19  HPI/Subjective: Insulin and examined. Admission H&P reviewed. Reports mild pain over left leg. Denies any trauma.  Objective: Filed Vitals:   11/30/14 0607  BP: 126/57  Pulse: 75  Temp: 97.6 F (36.4 C)  Resp: 20    Intake/Output Summary (Last 24 hours) at 11/30/14 1334 Last data filed at 11/30/14 0900  Gross per 24 hour  Intake    240 ml  Output      0 ml  Net    240 ml   Filed Weights   11/29/14 2024 11/29/14 2200  Weight: 80.287 kg  (177 lb) 80.4 kg (177 lb 4 oz)    Exam:   General:  Elderly female lying in bed in no acute distress  HEENT no pallor, moist oral mucosa,  Cardiovascular: Normal S1 and S2, no murmurs rub or gallop  Chest: Clear to auscultation bilaterally  GI: Soft, nondistended, nontender  Extremities: Swollen left leg with area of erythema involving more than 2/3rd of the left tibia with  Tenderness  CNS: AAO x2  Data Reviewed: Basic Metabolic Panel:  Recent Labs Lab 11/29/14 1804 11/30/14 0450  NA 140 142  K 3.9 3.7  CL 104 105  CO2 28 30  GLUCOSE 176* 113*  BUN 13 13  CREATININE 0.85 0.76  CALCIUM 8.7 8.7   Liver Function Tests:  Recent Labs Lab 11/30/14 0450  AST 15  ALT 9  ALKPHOS 45  BILITOT 0.7  PROT 5.4*  ALBUMIN 2.9*   No results for input(s): LIPASE, AMYLASE in the last 168 hours. No results for input(s): AMMONIA in the last 168 hours. CBC:  Recent Labs Lab 11/29/14 1804 11/30/14 0450  WBC 14.7* 12.9*  NEUTROABS 10.1* 7.4  HGB 8.8* 8.1*  HCT 27.7* 25.7*  MCV 108.6* 108.9*  PLT 286 253   Cardiac Enzymes: No results for input(s): CKTOTAL, CKMB, CKMBINDEX, TROPONINI in the last 168 hours. BNP (last 3 results)  Recent Labs  03/22/14 1054 08/15/14 1735  PROBNP 183.0* 171.4   CBG:  Recent Labs Lab 11/29/14 2252 11/30/14 0754 11/30/14 1147 11/30/14 1154  GLUCAP 114* 104* 118* 116*  No results found for this or any previous visit (from the past 240 hour(s)).   Studies: No results found.  Scheduled Meds: . aspirin  325 mg Oral Daily  . calcium-vitamin D  1 tablet Oral QODAY  . carvedilol  6.25 mg Oral BID WC  . cholecalciferol  5,000 Units Oral QODAY  . clonazePAM  0.5 mg Oral BID  . cyanocobalamin  500 mcg Oral Daily  . divalproex  250 mg Oral TID  . fluticasone  1 spray Each Nare Daily  . furosemide  40 mg Oral Daily  . insulin aspart  0-9 Units Subcutaneous TID WC  . ipratropium-albuterol  3 mL Nebulization TID  .  levothyroxine  175 mcg Oral QAC breakfast  . loratadine  10 mg Oral Daily  . losartan  50 mg Oral Daily  . OLANZapine  10 mg Oral QHS  . pantoprazole  40 mg Oral QODAY  . potassium chloride SA  20 mEq Oral Daily  . rivastigmine  4.6 mg Transdermal q morning - 10a  . sodium chloride  3 mL Intravenous Q12H  . traZODone  150 mg Oral QHS  . vancomycin  1,500 mg Intravenous Q24H   Continuous Infusions: . heparin 1,300 Units/hr (11/30/14 1252)      Time spent: 25 minutes    Paula [name]  [facility] Pager [phone] If 7PM-7AM, please contact night-coverage at [email or web address], password [facility] 11/30/2014, 1:34 PM  LOS: 1 day

## 2014-11-30 NOTE — Progress Notes (Signed)
Nutrition Brief Note  Patient identified on the Malnutrition Screening Tool (MST) Report  Wt Readings from Last 15 Encounters:  11/29/14 177 lb 4 oz (80.4 kg)  08/18/14 177 lb 11.1 oz (80.6 kg)  07/04/14 182 lb 4 oz (82.668 kg)  06/30/14 184 lb (83.462 kg)  06/21/14 181 lb 6.4 oz (82.283 kg)  06/20/14 182 lb (82.555 kg)  06/06/14 176 lb (79.833 kg)  06/03/14 177 lb (80.287 kg)  05/26/14 179 lb (81.194 kg)  05/12/14 177 lb 12 oz (80.627 kg)  05/02/14 176 lb 8 oz (80.06 kg)  04/28/14 176 lb (79.833 kg)  04/25/14 178 lb 8 oz (80.967 kg)  04/15/14 173 lb 12 oz (78.812 kg)  04/11/14 173 lb (78.472 kg)    Body mass index is 29.5 kg/(m^2). Patient meets criteria for Overweight based on current BMI.   Current diet order is Heart Healthy/Carb Mod, patient is consuming approximately >75% of meals at this time. Labs and medications reviewed.   Pt denied any changes in appetite pta, reported to eat three meals daily, and can feed self. Does not consume supplements d/t healthy appetite. Reported eating well at breakfast, which was confirmed by RN (95% meal completion). Provided pt mid morning snack  Previous medical records indicate wt stable 175-180 lbs in past 6 months  RN reported pt tolerating regular diet textures w/out difficulty, no sign of dysphagia or aspirations  No nutrition interventions warranted at this time. If nutrition issues arise, please consult RD.   Lloyd HugerSarah F Sumayyah Custodio MS RD LDN Clinical Dietitian Pager:[phone]

## 2014-11-30 NOTE — Progress Notes (Signed)
Clinical Social Work  Patient was discussed during progression meeting and MD reports that is not medically stable to DC today. [facility] chart review[facility] CSW called ALF in order to discuss patient returning when stable. CSW had to leave a message with ALF and will continue to follow.  Ferrer ComunidadHolly Bryla Burek, [place] [phone]

## 2014-11-30 NOTE — Progress Notes (Signed)
ANTICOAGULATION CONSULT NOTE - Initial Consult  Pharmacy Consult for IV heparin Indication: DVT  Allergies  Allergen Reactions  . Bactrim [Sulfamethoxazole-Trimethoprim] Anaphylaxis  . Contrast Media [Iodinated Diagnostic Agents] Anaphylaxis and Swelling  . Iodine Anaphylaxis  . Iohexol Anaphylaxis, Shortness Of Breath and Swelling     Desc: PT STATES SHE HAD A SEVERE REACTION TO IV CONRAST WITH THROAT SWELLING AND SOB. SHE WAS ADMITTED TO THE HOSPITAL. SHE HAS NEVER HAD CONTRAST AGAIN.   Marland Kitchen. Sulfa Antibiotics Anaphylaxis  . Aricept [Donepezil Hcl] Nausea And Vomiting  . Cymbalta [Duloxetine Hcl] Other (See Comments)    HALLUCINATIONS    . Delsym [Dextromethorphan] Other (See Comments)    Hallucination  . Guaifenesin & Derivatives Other (See Comments)    Hallucination  . Hydromet [Hydrocodone-Homatropine] Other (See Comments)    hallucinations  . Sodium Benzoate [Nutritional Supplements]     Per mar  . Trimethoprim     From mar  . Avelox [Moxifloxacin Hcl In Nacl] Rash  . Ciprofloxacin Itching  . Gadolinium Derivatives Other (See Comments)    Unknown    . Penicillins Rash    Rash as a child  . Quinolones Rash    Patient Measurements: Height: 5\' 5"  (165.1 cm) Weight: 177 lb 4 oz (80.4 kg) IBW/kg (Calculated) : 57 Heparin Dosing Weight:   Vital Signs: Temp: 97.6 F (36.4 C) (01/20 0607) Temp Source: Axillary (01/20 0607) BP: 126/57 mmHg (01/20 0607) Pulse Rate: 75 (01/20 0607)  Labs:  Recent Labs  11/29/14 1804 11/29/14 2040 11/30/14 0450  HGB 8.8*  --  8.1*  HCT 27.7*  --  25.7*  PLT 286  --  253  APTT  --  41*  --   LABPROT  --  15.1  --   INR  --  1.18  --   HEPARINUNFRC  --   --  0.18*  CREATININE 0.85  --  0.76    Estimated Creatinine Clearance: 53.9 mL/min (by C-G formula based on Cr of 0.76).   Medical History: Past Medical History  Diagnosis Date  . Depression   . Ejection fraction     EF 40%, echo, November, 2012  / in improved, EF 60%,  echo, December, 2012  . Contrast media allergy     Patient feels poorly with contrast  . Status post AAA (abdominal aortic aneurysm) repair     Surgical repair, Dr. Hart RochesterLawson, December 27, 2011  . Pre-syncope     vasovagal w/ heart block  . Parotid mass     has refused further eval 10/2011  . GERD (gastroesophageal reflux disease)   . Anxiety   . Facial tic     L sided spasms - Botox trial summer 2013, ?effective  . ARMD (age related macular degeneration) 06/2014    bilateral vision loss - legally blind  . AAA (abdominal aortic aneurysm)     s/p repair 12/2011  . Congestive heart failure     NL EF 10/2011 echo   . Diabetes mellitus type II, controlled   . Hypothyroidism   . Sinus bradycardia 09/19/2011    Occurring simultaneously with complete heart block   . Hypertension   . COPD (chronic obstructive pulmonary disease)   . 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 Dementia     MRI with progressive chronic microvascular ischemia and generalized cerebral atrophy  . Lumbosacral spondylosis without myelopathy   . Legally blind 06/2014    due to ARMD  Assessment: 36 yoF [facility] and cellulitis, found to have multiple acute DVTs. PMHx significant for AAA s/p repair 2013, CHF, T2DM, COPD, ARMD, and dementia. Pharmacy consulted to start IV heparin for VTE treatment.  Note PTA on ASA 325.    Heparin dosing weight = 74kg Baseline aPTT/INR minimally elevated Appears baseline hgb ~11  Significant events: 1/19: Renal fxn: no issues noted, CBC: Hgb low 8.8, plts WNL.  No signs/symptoms of bleeding per MD, waiting on results of FOBT.  Discussed with MD and will NOT bolus heparin.   Today, 11/30/2014:  First HL SUBtherapeutic on 1100 units/hr  FOBT negative but Hgb continues to drop; MD aware  Plt wnl  No bleeding or infusion issues noted per nursing   Goal of Therapy:  Heparin level 0.3-0.7 units/ml Monitor platelets by  anticoagulation protocol: Yes   Plan:   Increase IV UHF to 1300 units/hr.  Check HL in 8 hr  Daily CBC; f/u Hgb, plt  F/u plans to transition to oral anticoagulation   Bernadene Person, PharmD Pager: [phone] 11/30/2014, 12:04 PM

## 2014-11-30 NOTE — Progress Notes (Signed)
UR complete 

## 2014-12-01 DIAGNOSIS — E119 Type 2 diabetes mellitus without complications: Secondary | ICD-10-CM

## 2014-12-01 LAB — CBC
HEMATOCRIT: 25.9 % — AB (ref 36.0–46.0)
Hemoglobin: 8.2 g/dL — ABNORMAL LOW (ref 12.0–15.0)
MCH: 34.3 pg — AB (ref 26.0–34.0)
MCHC: 31.7 g/dL (ref 30.0–36.0)
MCV: 108.4 fL — ABNORMAL HIGH (ref 78.0–100.0)
PLATELETS: 263 10*3/uL (ref 150–400)
RBC: 2.39 MIL/uL — ABNORMAL LOW (ref 3.87–5.11)
RDW: 16.9 % — AB (ref 11.5–15.5)
WBC: 12.9 10*3/uL — AB (ref 4.0–10.5)

## 2014-12-01 LAB — GLUCOSE, CAPILLARY: GLUCOSE-CAPILLARY: 104 mg/dL — AB (ref 70–99)

## 2014-12-01 MED ORDER — HEPARIN (PORCINE) IN NACL 100-0.45 UNIT/ML-% IJ SOLN
1450.0000 [IU]/h | INTRAMUSCULAR | Status: AC
  Filled 2014-12-01: qty 250

## 2014-12-01 MED ORDER — DOXYCYCLINE HYCLATE 100 MG PO TABS
100.0000 mg | ORAL_TABLET | Freq: Two times a day (BID) | ORAL | Status: DC
  Administered 2014-12-01: 100 mg via ORAL
  Filled 2014-12-01 (×2): qty 1

## 2014-12-01 MED ORDER — APIXABAN 5 MG PO TABS: 5.0000 mg | ORAL_TABLET | Freq: Two times a day (BID) | ORAL | Status: DC

## 2014-12-01 MED ORDER — DOXYCYCLINE HYCLATE 100 MG PO TABS: 100.0000 mg | ORAL_TABLET | Freq: Two times a day (BID) | ORAL | Status: DC

## 2014-12-01 MED ORDER — APIXABAN 5 MG PO TABS
10.0000 mg | ORAL_TABLET | Freq: Two times a day (BID) | ORAL | Status: DC
  Administered 2014-12-01: 10 mg via ORAL
  Filled 2014-12-01 (×2): qty 2

## 2014-12-01 MED ORDER — CLONAZEPAM 0.5 MG PO TABS: 0.5000 mg | ORAL_TABLET | Freq: Two times a day (BID) | ORAL | Status: DC | PRN

## 2014-12-01 MED ORDER — APIXABAN 5 MG PO TABS: 10.0000 mg | ORAL_TABLET | Freq: Two times a day (BID) | ORAL | Status: DC

## 2014-12-01 NOTE — Progress Notes (Signed)
Clinical Social Work  CSW attempted 2 times to [facility] Per admissions coordinator, Tammy, their fax machine is not working and she is unable to receive information. CSW explained that patient has new medications and reviewed those with ALF. ALF agreeable to accept patient. CSW informed patient and spoke with husband re: patient returning to ALF. Husband thanked CSW for call and reports he is happy that patient is returning. CSW prepared DC packet with FL2, DC summary, DNR, hard scripts, and PTAR forms. PTAR arranged, request #: [id number].  CSW is signing off but available if needed.  Ludlow FallsHolly Alysia Scism, [place] [phone]

## 2014-12-01 NOTE — Discharge Instructions (Signed)
Information on my medicine - ELIQUIS (apixaban)  This medication education was reviewed with me or my healthcare representative as part of my discharge preparation.  The pharmacist that spoke with me during my hospital stay was:  Coy Rochford A, RPH  Why was Eliquis prescribed for you? Eliquis was prescribed to treat blood clots that may have been found in the veins of your legs (deep vein thrombosis) or in your lungs (pulmonary embolism) and to reduce the risk of them occurring again.  What do You need to know about Eliquis ? The starting dose is 10 mg (two 5 mg tablets) taken TWICE daily for the FIRST SEVEN (7) DAYS, then on (enter date)  12/08/2014  the dose is reduced to ONE 5 mg tablet taken TWICE daily.  Eliquis may be taken with or without food.   Try to take the dose about the same time in the morning and in the evening. If you have difficulty swallowing the tablet whole please discuss with your pharmacist how to take the medication safely.  Take Eliquis exactly as prescribed and DO NOT stop taking Eliquis without talking to the doctor who prescribed the medication.  Stopping may increase your risk of developing a new blood clot.  Refill your prescription before you run out.  After discharge, you should have regular check-up appointments with your healthcare provider that is prescribing your Eliquis.    What do you do if you miss a dose? If a dose of ELIQUIS is not taken at the scheduled time, take it as soon as possible on the same day and twice-daily administration should be resumed. The dose should not be doubled to make up for a missed dose.  Important Safety Information A possible side effect of Eliquis is bleeding. You should call your healthcare provider right away if you experience any of the following: ? Bleeding from an injury or your nose that does not stop. ? Unusual colored urine (red or dark brown) or unusual colored stools (red or black). ? Unusual bruising  for unknown reasons. ? A serious fall or if you hit your head (even if there is no bleeding).  Some medicines may interact with Eliquis and might increase your risk of bleeding or clotting while on Eliquis. To help avoid this, consult your healthcare provider or pharmacist prior to using any new prescription or non-prescription medications, including herbals, vitamins, non-steroidal anti-inflammatory drugs (NSAIDs) and supplements.  This website has more information on Eliquis (apixaban): http://www.eliquis.com/eliquis/home

## 2014-12-01 NOTE — Discharge Summary (Signed)
Physician Discharge Summary  Paula [name] ZOX:[id number] DOB: 1927-04-24 DOA: 11/29/2014  PCP: [name], MD  Admit date: 11/29/2014 Discharge date: 12/01/2014  Time spent: 30 minutes  Recommendations for Outpatient Follow-up:  Check CBC to follow Hgb trend Check CMET to follow electrolytes, renal function and LFT's  Discharge Diagnoses:  Principal Problem:   Cellulitis of left lower extremity Active Problems:   Hypothyroidism   Chronic diastolic CHF (congestive heart failure)   DVT (deep venous thrombosis)   Macrocytic anemia   Diabetes mellitus type 2, controlled   Discharge Condition: stable and improved. Discharge back to ALF. Follow up with PCP in 10 days  Diet recommendation: heart healthy diet (< 2 gram sodium)  Filed Weights   11/29/14 2024 11/29/14 2200 12/01/14 0458  Weight: 80.287 kg (177 lb) 80.4 kg (177 lb 4 oz) 80.8 kg (178 lb 2.1 oz)    History of present illness:  81 y.o. female history of dementia, diastolic CHF, COPD, hypothyroidism, diabetes mellitus, hypertension, was brought to the ER after patient was found to have increasing swelling and erythema of the left lower extremity over the last 2 days. Patient had some nausea but denies any abdominal pain diarrhea vomiting. Patient denies any chest pain or shortness of breath. In the ER patient on exam is found to have erythematous leg extending from the ankle to the left knee area. Dopplers done shows DVT of the left lower extremity. He has been admitted for management of left lower extremity DVT and cellulitis. Patient is also found to be anemic with hemoglobin dropping from 11-8 in one month. Stool for occult blood has been negative.  Hospital Course:  DVT of left lower extremity (left common femoral vein) -No clear etiology except for poor mobility. -Hgb stable and in fact trending up -will discharge on eliquis -close follow up for potential bleeding -will require at least 6 month therapy  Left  lower leg cellulitis -WBC trending down -no fever and tolerating PO's -will discharge on doxycycline; erythema is improving as well  Anemia -Drop in H&H noted to 8 from baseline of 11 few months ago.  -No signs of bleeding appreciated to granted inpatient EGD or colonoscopy -Monitor closely and if bleeding appreciated will require work up; especially as she is on anticoagulation now.  Type 2 diabetes mellitus -will resume metformin   Hypertension -Continue home medications, BP is well controlled  Chronic diastolic CHF -Appears euvolemic. Continue Lasix, b-blocker, ARB and low sodium diet -please check weight on daily basis  COPD -Stable. Continue home inhaler regimen -no wheezing  Dementia with behavioral disturbances -Appears stable. Will continue home medication regimen (exelon patch, PRN klonopin, PRN haldol, continue use of depakote and zyprexa)   Procedures:  LE duplex: positive for acute LLE DVT  Consultations:  None   Discharge Exam: Filed Vitals:   12/01/14 1152  BP: 111/46  Pulse:   Temp:   Resp:     General: afebrile, NAD. Complaining of some pain in her LLE; no CP, no SOB  Cardiovascular: S1 and S2, no rubs or gallops Respiratory: CTA bilaterally Abd: soft, NT, ND, positive BS, no guarding Ext:LLE with mild erythema and warm sensation, no open wounds or drainage, tender to palpation and swollen  Discharge Instructions   Discharge Instructions    Diet - low sodium heart healthy    Complete by:  As directed      Discharge instructions    Complete by:  As directed   Please ensure good hydration Medications  as prescribed Arrange follow up with PCP in 10 days Watch closely for any signs of bleeding (hematuria, melena, hematochezia, etc...) LLE to be kept elevated when sitting or laying on bed          Current Discharge Medication List    START taking these medications   Details  !! apixaban (ELIQUIS) 5 MG TABS tablet Take 2 tablets (10 mg  total) by mouth 2 (two) times daily. Qty: 28 tablet, Refills: 0    !! apixaban (ELIQUIS) 5 MG TABS tablet Take 1 tablet (5 mg total) by mouth 2 (two) times daily. Qty: 60 tablet, Refills: 1    doxycycline (VIBRA-TABS) 100 MG tablet Take 1 tablet (100 mg total) by mouth every 12 (twelve) hours. Qty: 20 tablet, Refills: 0     !! - Potential duplicate medications found. Please discuss with provider.    CONTINUE these medications which have CHANGED   Details  clonazePAM (KLONOPIN) 0.5 MG tablet Take 1 tablet (0.5 mg total) by mouth 2 (two) times daily as needed for anxiety. Qty: 30 tablet, Refills: 0      CONTINUE these medications which have NOT CHANGED   Details  acetaminophen (TYLENOL) 500 MG tablet Take 500 mg by mouth every 6 (six) hours as needed (pain).    albuterol (PROVENTIL HFA;VENTOLIN HFA) 108 (90 BASE) MCG/ACT inhaler Inhale 2 puffs into the lungs every 6 (six) hours as needed for wheezing or shortness of breath. Qty: 3 Inhaler, Refills: 0    alum & mag hydroxide-simeth (MAALOX/MYLANTA) 200-200-20 MG/5ML suspension Take 30 mLs by mouth every 4 (four) hours as needed for indigestion or heartburn.    baclofen (LIORESAL) 10 MG tablet Take 10 mg by mouth 3 (three) times daily as needed for muscle spasms.    benzonatate (TESSALON) 100 MG capsule Take 200 mg by mouth at bedtime as needed for cough.    Calcium Carbonate-Vitamin D (CALCARB 600/D) 600-400 MG-UNIT per tablet Take 1 tablet by mouth every other day.     carvedilol (COREG) 6.25 MG tablet Take 6.25 mg by mouth 2 (two) times daily with a meal.    Cholecalciferol (VITAMIN D3) 5000 UNITS TABS Take 5,000 Units by mouth every other day.     cyanocobalamin 500 MCG tablet Take 500 mcg by mouth daily.    !! divalproex (DEPAKOTE) 250 MG DR tablet Take 250 mg by mouth 3 (three) times daily.    fluticasone (FLONASE) 50 MCG/ACT nasal spray Place 1 spray into both nostrils daily. Qty: 16 g, Refills: 3    furosemide (LASIX)  20 MG tablet Take 2 tablets (40 mg total) by mouth daily.    guaifenesin (ROBITUSSIN) 100 MG/5ML syrup Take 200 mg by mouth every 6 (six) hours as needed for cough.    ipratropium-albuterol (DUONEB) 0.5-2.5 (3) MG/3ML SOLN Take 3 mLs by nebulization 3 (three) times daily.    levothyroxine (SYNTHROID, LEVOTHROID) 175 MCG tablet Take 175 mcg by mouth daily before breakfast.     loperamide (IMODIUM) 2 MG capsule Take 2 mg by mouth as needed for diarrhea or loose stools.    loratadine (CLARITIN) 10 MG tablet Take 10 mg by mouth daily.    losartan (COZAAR) 50 MG tablet Take 50 mg by mouth daily.    magnesium hydroxide (MILK OF MAGNESIA) 400 MG/5ML suspension Take 30 mLs by mouth daily as needed for mild constipation.    metFORMIN (GLUCOPHAGE-XR) 500 MG 24 hr tablet Take 500 mg by mouth daily with breakfast.  Neomycin-Bacitracin-Polymyxin (TRIPLE ANTIBIOTIC EX) Apply topically as needed (minor skin tears or abrasions.).    !! OLANZapine (ZYPREXA) 10 MG tablet Take 10 mg by mouth at bedtime.    pantoprazole (PROTONIX) 40 MG tablet Take 40 mg by mouth every other day.     Polyethyl Glycol-Propyl Glycol (SYSTANE) 0.4-0.3 % SOLN Apply 1 drop to eye 2 (two) times daily as needed (dry eyes).    potassium chloride SA (K-DUR,KLOR-CON) 20 MEQ tablet Take 20 mEq by mouth daily.    rivastigmine (EXELON) 4.6 mg/24hr Place 4.6 mg onto the skin every morning.    traZODone (DESYREL) 150 MG tablet Take 150 mg by mouth at bedtime.    !! divalproex (DEPAKOTE) 125 MG DR tablet Take 2 tablets (250 mg total) by mouth 3 (three) times daily with meals.    fexofenadine (ALLEGRA) 180 MG tablet Take 1 tablet (180 mg total) by mouth daily. Qty: 30 tablet, Refills: 3    haloperidol (HALDOL) 0.5 MG tablet Take 1 tablet (0.5 mg total) by mouth every 8 (eight) hours as needed for agitation.    !! OLANZapine (ZYPREXA) 5 MG tablet Take 5 mg by mouth 2 (two) times daily.     !! - Potential duplicate medications  found. Please discuss with provider.    STOP taking these medications     cefdinir (OMNICEF) 300 MG capsule      aspirin 325 MG tablet      losartan-hydrochlorothiazide (HYZAAR) 100-12.5 MG per tablet      nitrofurantoin, macrocrystal-monohydrate, (MACROBID) 100 MG capsule        Allergies  Allergen Reactions  . Bactrim [Sulfamethoxazole-Trimethoprim] Anaphylaxis  . Contrast Media [Iodinated Diagnostic Agents] Anaphylaxis and Swelling  . Iodine Anaphylaxis  . Iohexol Anaphylaxis, Shortness Of Breath and Swelling     Desc: PT STATES SHE HAD A SEVERE REACTION TO IV CONRAST WITH THROAT SWELLING AND SOB. SHE WAS ADMITTED TO THE HOSPITAL. SHE HAS NEVER HAD CONTRAST AGAIN.   Marland Kitchen Sulfa Antibiotics Anaphylaxis  . Aricept [Donepezil Hcl] Nausea And Vomiting  . Cymbalta [Duloxetine Hcl] Other (See Comments)    HALLUCINATIONS    . Delsym [Dextromethorphan] Other (See Comments)    Hallucination  . Guaifenesin & Derivatives Other (See Comments)    Hallucination  . Hydromet [Hydrocodone-Homatropine] Other (See Comments)    hallucinations  . Sodium Benzoate [Nutritional Supplements]     Per mar  . Trimethoprim     From mar  . Avelox [Moxifloxacin Hcl In Nacl] Rash  . Ciprofloxacin Itching  . Gadolinium Derivatives Other (See Comments)    Unknown    . Penicillins Rash    Rash as a child  . Quinolones Rash   Follow-up Information    Follow up with [name], MD. Schedule an appointment as soon as possible for a visit in 10 days.   Specialty:  Family Medicine   Contact information:   [address] [place] [address] [phone]       The results of significant diagnostics from this hospitalization (including imaging, microbiology, ancillary and laboratory) are listed below for reference.    Labs: Basic Metabolic Panel:  Recent Labs Lab 11/29/14 1804 11/30/14 0450  NA 140 142  K 3.9 3.7  CL 104 105  CO2 28 30  GLUCOSE 176* 113*  BUN 13 13   CREATININE 0.85 0.76  CALCIUM 8.7 8.7   Liver Function Tests:  Recent Labs Lab 11/30/14 0450  AST 15  ALT 9  ALKPHOS 45  BILITOT 0.7  PROT 5.4*  ALBUMIN 2.9*   CBC:  Recent Labs Lab 11/29/14 1804 11/30/14 0450 12/01/14 0517  WBC 14.7* 12.9* 12.9*  NEUTROABS 10.1* 7.4  --   HGB 8.8* 8.1* 8.2*  HCT 27.7* 25.7* 25.9*  MCV 108.6* 108.9* 108.4*  PLT 286 253 263   BNP (last 3 results)  Recent Labs  03/22/14 1054 08/15/14 1735  PROBNP 183.0* 171.4   CBG:  Recent Labs Lab 11/30/14 0754 11/30/14 1147 11/30/14 1154 11/30/14 1636 12/01/14 0731  GLUCAP 104* 118* 116* 111* 104*    Signed:  Vassie Loll  [facility] 12/01/2014, 1:14 PM

## 2014-12-01 NOTE — Progress Notes (Signed)
ANTICOAGULATION CONSULT NOTE - Follow Up Consult  Pharmacy Consult for heparin Indication: DVT  Allergies  Allergen Reactions  . Bactrim [Sulfamethoxazole-Trimethoprim] Anaphylaxis  . Contrast Media [Iodinated Diagnostic Agents] Anaphylaxis and Swelling  . Iodine Anaphylaxis  . Iohexol Anaphylaxis, Shortness Of Breath and Swelling     Desc: PT STATES SHE HAD A SEVERE REACTION TO IV CONRAST WITH THROAT SWELLING AND SOB. SHE WAS ADMITTED TO THE HOSPITAL. SHE HAS NEVER HAD CONTRAST AGAIN.   Marland Kitchen. Sulfa Antibiotics Anaphylaxis  . Aricept [Donepezil Hcl] Nausea And Vomiting  . Cymbalta [Duloxetine Hcl] Other (See Comments)    HALLUCINATIONS    . Delsym [Dextromethorphan] Other (See Comments)    Hallucination  . Guaifenesin & Derivatives Other (See Comments)    Hallucination  . Hydromet [Hydrocodone-Homatropine] Other (See Comments)    hallucinations  . Sodium Benzoate [Nutritional Supplements]     Per mar  . Trimethoprim     From mar  . Avelox [Moxifloxacin Hcl In Nacl] Rash  . Ciprofloxacin Itching  . Gadolinium Derivatives Other (See Comments)    Unknown    . Penicillins Rash    Rash as a child  . Quinolones Rash    Patient Measurements: Height: 5\' 5"  (165.1 cm) Weight: 177 lb 4 oz (80.4 kg) IBW/kg (Calculated) : 57 Heparin Dosing Weight:   Vital Signs: Temp: 98.6 F (37 C) (01/21 0458) Temp Source: Oral (01/21 0458) BP: 128/57 mmHg (01/21 0458) Pulse Rate: 83 (01/21 0458)  Labs:  Recent Labs  11/29/14 1804 11/29/14 2040 11/30/14 0450 11/30/14 2100  HGB 8.8*  --  8.1*  --   HCT 27.7*  --  25.7*  --   PLT 286  --  253  --   APTT  --  41*  --   --   LABPROT  --  15.1  --   --   INR  --  1.18  --   --   HEPARINUNFRC  --   --  0.18* 0.29*  CREATININE 0.85  --  0.76  --     Estimated Creatinine Clearance: 53.9 mL/min (by C-G formula based on Cr of 0.76).   Medications:  Infusions:  . heparin 1,450 Units/hr (12/01/14 0117)    Assessment: Patient with  DVT and low heparin level.  NO issues per RN.  Goal of Therapy:  Heparin level 0.3-0.7 units/ml Monitor platelets by anticoagulation protocol: Yes   Plan:  Increase heparin to 1450 units/hr Heparin level at 0900  Darlina GuysGrimsley Jr, Jacquenette ShoneJulian Crowford 12/01/2014,5:46 AM

## 2014-12-01 NOTE — Progress Notes (Signed)
ANTICOAGULATION CONSULT NOTE - Initial Consult  Pharmacy Consult for IV heparin >> Eliquis Indication: New DVT  Allergies  Allergen Reactions  . Bactrim [Sulfamethoxazole-Trimethoprim] Anaphylaxis  . Contrast Media [Iodinated Diagnostic Agents] Anaphylaxis and Swelling  . Iodine Anaphylaxis  . Iohexol Anaphylaxis, Shortness Of Breath and Swelling     Desc: PT STATES SHE HAD A SEVERE REACTION TO IV CONRAST WITH THROAT SWELLING AND SOB. SHE WAS ADMITTED TO THE HOSPITAL. SHE HAS NEVER HAD CONTRAST AGAIN.   Marland Kitchen Sulfa Antibiotics Anaphylaxis  . Aricept [Donepezil Hcl] Nausea And Vomiting  . Cymbalta [Duloxetine Hcl] Other (See Comments)    HALLUCINATIONS    . Delsym [Dextromethorphan] Other (See Comments)    Hallucination  . Guaifenesin & Derivatives Other (See Comments)    Hallucination  . Hydromet [Hydrocodone-Homatropine] Other (See Comments)    hallucinations  . Sodium Benzoate [Nutritional Supplements]     Per mar  . Trimethoprim     From mar  . Avelox [Moxifloxacin Hcl In Nacl] Rash  . Ciprofloxacin Itching  . Gadolinium Derivatives Other (See Comments)    Unknown    . Penicillins Rash    Rash as a child  . Quinolones Rash    Patient Measurements: Height:  (165.1 cm) Weight: 178 lb 2.1 oz (80.8 kg) IBW/kg (Calculated) : 57 Heparin Dosing Weight:   Vital Signs: Temp: 98.6 F (37 C) (01/21 0458) Temp Source: Oral (01/21 0458) BP: 128/57 mmHg (01/21 0458) Pulse Rate: 83 (01/21 0458)  Labs:  Recent Labs  11/29/14 1804 11/29/14 2040 11/30/14 0450 11/30/14 2100 12/01/14 0517  HGB 8.8*  --  8.1*  --  8.2*  HCT 27.7*  --  25.7*  --  25.9*  PLT 286  --  253  --  263  APTT  --  41*  --   --   --   LABPROT  --  15.1  --   --   --   INR  --  1.18  --   --   --   HEPARINUNFRC  --   --  0.18* 0.29*  --   CREATININE 0.85  --  0.76  --   --     Estimated Creatinine Clearance: 54 mL/min (by C-G formula based on Cr of 0.76).   Medical History: Past Medical  History  Diagnosis Date  . Depression   . Ejection fraction     EF 40%, echo, November, 2012  / in improved, EF 60%, echo, December, 2012  . Contrast media allergy     Patient feels poorly with contrast  . Status post AAA (abdominal aortic aneurysm) repair     Surgical repair, Dr. Hart Rochester, December 27, 2011  . Pre-syncope     vasovagal w/ heart block  . Parotid mass     has refused further eval 10/2011  . GERD (gastroesophageal reflux disease)   . Anxiety   . Facial tic     L sided spasms - Botox trial summer 2013, ?effective  . ARMD (age related macular degeneration) 06/2014    bilateral vision loss - legally blind  . AAA (abdominal aortic aneurysm)     s/p repair 12/2011  . Congestive heart failure     NL EF 10/2011 echo   . Diabetes mellitus type II, controlled   . Hypothyroidism   . Sinus bradycardia 09/19/2011    Occurring simultaneously with complete heart block   . Hypertension   . COPD (chronic obstructive pulmonary disease)   . Facial  spasm     L hemifacial-treated w/ botox xeomin -Dr Terrace ArabiaYan  . Dementia     MRI with progressive chronic microvascular ischemia and generalized cerebral atrophy  . Lumbosacral spondylosis without myelopathy   . Legally blind 06/2014    due to ARMD   Assessment: [age] yo[facility] swelling and cellulitis, found to have multiple acute DVTs. PMHx significant for AAA s/p repair 2013, CHF, T2DM, COPD, ARMD, and dementia. Pharmacy consulted to start IV heparin for VTE treatment.  Note PTA on ASA 325.  Consulted today to switch to Eliquis prior to d/c  Heparin dosing weight = 74kg Baseline aPTT/INR minimally elevated Appears baseline hgb ~11  Significant events: 1/19: Renal fxn: no issues noted, CBC: Hgb low 8.8, plts WNL.  No signs/symptoms of bleeding per MD, waiting on results of FOBT.  Discussed with MD and will NOT bolus heparin.  1/21: switching to Eliquis for DVT Tx  Today, 12/01/2014:  Last heparin level low, cancelled  f/u level  FOBT negative but Hgb low, now stable around 8; Plt wnl  No bleeding or infusion issues noted per nursing  SCr 0.76; CrCl 64 ml/min using TBW and SCr = 0.8  On ASA 325 PTA, no other interactions noted   Goal of Therapy:  Heparin level 0.3-0.7 units/ml Monitor platelets by anticoagulation protocol: Yes   Plan:   Stop heparin drip at 1000 today  Begin Eliquis 10 mg bid at 1000 today, to continue for 7 days  On 1/28, patient is to switch to 5 mg bid with appropriate follow up by PCP or cardiologist  F/u CBC, SCr while admitted  Discussed ASA 325 mg with MD and will discontinue; may be resumed after DVT treatment as appropriate, but recommend a lower 81 mg daily dose.  Thank you for the consult,  Bernadene Personrew Hason Ofarrell, PharmD Pager: [phone] 12/01/2014, 9:34 AM

## 2014-12-02 LAB — GLUCOSE, CAPILLARY: GLUCOSE-CAPILLARY: 98 mg/dL (ref 70–99)

## 2014-12-05 LAB — GLUCOSE, CAPILLARY
GLUCOSE-CAPILLARY: 140 mg/dL — AB (ref 70–99)
Glucose-Capillary: 125 mg/dL — ABNORMAL HIGH (ref 70–99)

## 2014-12-13 ENCOUNTER — Encounter: Payer: Self-pay | Admitting: Cardiology

## 2014-12-13 DIAGNOSIS — I779 Disorder of arteries and arterioles, unspecified: Secondary | ICD-10-CM | POA: Insufficient documentation

## 2014-12-13 DIAGNOSIS — I739 Peripheral vascular disease, unspecified: Secondary | ICD-10-CM

## 2014-12-14 ENCOUNTER — Encounter (HOSPITAL_COMMUNITY): Payer: Self-pay | Admitting: Emergency Medicine

## 2014-12-14 ENCOUNTER — Inpatient Hospital Stay (HOSPITAL_COMMUNITY)
Admission: EM | Admit: 2014-12-14 | Discharge: 2014-12-20 | DRG: 603 | Disposition: A | Discharge status: Skilled Nursing Facility | Payer: Medicare Other | Attending: Family Medicine | Admitting: Family Medicine

## 2014-12-14 DIAGNOSIS — Z885 Allergy status to narcotic agent status: Secondary | ICD-10-CM

## 2014-12-14 DIAGNOSIS — Z66 Do not resuscitate: Secondary | ICD-10-CM | POA: Diagnosis present

## 2014-12-14 DIAGNOSIS — Z792 Long term (current) use of antibiotics: Secondary | ICD-10-CM | POA: Diagnosis not present

## 2014-12-14 DIAGNOSIS — Z880 Allergy status to penicillin: Secondary | ICD-10-CM | POA: Diagnosis not present

## 2014-12-14 DIAGNOSIS — F329 Major depressive disorder, single episode, unspecified: Secondary | ICD-10-CM | POA: Diagnosis present

## 2014-12-14 DIAGNOSIS — Z800 Family history of malignant neoplasm of digestive organs: Secondary | ICD-10-CM | POA: Diagnosis not present

## 2014-12-14 DIAGNOSIS — Z820 Family history of epilepsy and other diseases of the nervous system: Secondary | ICD-10-CM

## 2014-12-14 DIAGNOSIS — F419 Anxiety disorder, unspecified: Secondary | ICD-10-CM | POA: Diagnosis present

## 2014-12-14 DIAGNOSIS — I5032 Chronic diastolic (congestive) heart failure: Secondary | ICD-10-CM | POA: Diagnosis present

## 2014-12-14 DIAGNOSIS — Z91041 Radiographic dye allergy status: Secondary | ICD-10-CM

## 2014-12-14 DIAGNOSIS — H3530 Unspecified macular degeneration: Secondary | ICD-10-CM | POA: Diagnosis present

## 2014-12-14 DIAGNOSIS — Z8679 Personal history of other diseases of the circulatory system: Secondary | ICD-10-CM | POA: Diagnosis not present

## 2014-12-14 DIAGNOSIS — Z8249 Family history of ischemic heart disease and other diseases of the circulatory system: Secondary | ICD-10-CM

## 2014-12-14 DIAGNOSIS — K219 Gastro-esophageal reflux disease without esophagitis: Secondary | ICD-10-CM | POA: Diagnosis present

## 2014-12-14 DIAGNOSIS — J449 Chronic obstructive pulmonary disease, unspecified: Secondary | ICD-10-CM | POA: Diagnosis present

## 2014-12-14 DIAGNOSIS — I82402 Acute embolism and thrombosis of unspecified deep veins of left lower extremity: Secondary | ICD-10-CM | POA: Diagnosis present

## 2014-12-14 DIAGNOSIS — H548 Legal blindness, as defined in USA: Secondary | ICD-10-CM | POA: Diagnosis present

## 2014-12-14 DIAGNOSIS — M7989 Other specified soft tissue disorders: Secondary | ICD-10-CM | POA: Diagnosis present

## 2014-12-14 DIAGNOSIS — F0390 Unspecified dementia without behavioral disturbance: Secondary | ICD-10-CM | POA: Diagnosis present

## 2014-12-14 DIAGNOSIS — Z87891 Personal history of nicotine dependence: Secondary | ICD-10-CM | POA: Diagnosis not present

## 2014-12-14 DIAGNOSIS — E039 Hypothyroidism, unspecified: Secondary | ICD-10-CM | POA: Diagnosis present

## 2014-12-14 DIAGNOSIS — I10 Essential (primary) hypertension: Secondary | ICD-10-CM | POA: Diagnosis present

## 2014-12-14 DIAGNOSIS — L03116 Cellulitis of left lower limb: Principal | ICD-10-CM

## 2014-12-14 DIAGNOSIS — E119 Type 2 diabetes mellitus without complications: Secondary | ICD-10-CM

## 2014-12-14 DIAGNOSIS — Z888 Allergy status to other drugs, medicaments and biological substances status: Secondary | ICD-10-CM

## 2014-12-14 DIAGNOSIS — I82409 Acute embolism and thrombosis of unspecified deep veins of unspecified lower extremity: Secondary | ICD-10-CM | POA: Diagnosis present

## 2014-12-14 DIAGNOSIS — Z993 Dependence on wheelchair: Secondary | ICD-10-CM | POA: Diagnosis not present

## 2014-12-14 DIAGNOSIS — I87009 Postthrombotic syndrome without complications of unspecified extremity: Secondary | ICD-10-CM

## 2014-12-14 DIAGNOSIS — Z803 Family history of malignant neoplasm of breast: Secondary | ICD-10-CM | POA: Diagnosis not present

## 2014-12-14 DIAGNOSIS — Z881 Allergy status to other antibiotic agents status: Secondary | ICD-10-CM | POA: Diagnosis not present

## 2014-12-14 DIAGNOSIS — I87002 Postthrombotic syndrome without complications of left lower extremity: Secondary | ICD-10-CM | POA: Diagnosis present

## 2014-12-14 DIAGNOSIS — Z7901 Long term (current) use of anticoagulants: Secondary | ICD-10-CM

## 2014-12-14 DIAGNOSIS — Z79899 Other long term (current) drug therapy: Secondary | ICD-10-CM

## 2014-12-14 LAB — BASIC METABOLIC PANEL
ANION GAP: 9 (ref 5–15)
BUN: 10 mg/dL (ref 6–23)
CO2: 28 mmol/L (ref 19–32)
Calcium: 9 mg/dL (ref 8.4–10.5)
Chloride: 105 mmol/L (ref 96–112)
Creatinine, Ser: 0.86 mg/dL (ref 0.50–1.10)
GFR calc Af Amer: 69 mL/min — ABNORMAL LOW (ref >90)
GFR, EST NON AFRICAN AMERICAN: 60 mL/min — AB (ref >90)
Glucose, Bld: 113 mg/dL — ABNORMAL HIGH (ref 70–99)
Potassium: 3.4 mmol/L — ABNORMAL LOW (ref 3.5–5.1)
Sodium: 142 mmol/L (ref 135–145)

## 2014-12-14 LAB — PROTIME-INR
INR: 1.34 (ref 0.00–1.49)
PROTHROMBIN TIME: 16.8 s — AB (ref 11.6–15.2)

## 2014-12-14 LAB — CBC WITH DIFFERENTIAL/PLATELET
Basophils Absolute: 0 10*3/uL (ref 0.0–0.1)
Basophils Relative: 0 % (ref 0–1)
EOS ABS: 0.5 10*3/uL (ref 0.0–0.7)
Eosinophils Relative: 4 % (ref 0–5)
HEMATOCRIT: 30.1 % — AB (ref 36.0–46.0)
Hemoglobin: 9.4 g/dL — ABNORMAL LOW (ref 12.0–15.0)
LYMPHS PCT: 26 % (ref 12–46)
Lymphs Abs: 3 10*3/uL (ref 0.7–4.0)
MCH: 34.3 pg — ABNORMAL HIGH (ref 26.0–34.0)
MCHC: 31.2 g/dL (ref 30.0–36.0)
MCV: 109.9 fL — ABNORMAL HIGH (ref 78.0–100.0)
MONOS PCT: 8 % (ref 3–12)
Monocytes Absolute: 1 10*3/uL (ref 0.1–1.0)
Neutro Abs: 7 10*3/uL (ref 1.7–7.7)
Neutrophils Relative %: 62 % (ref 43–77)
Platelets: 291 10*3/uL (ref 150–400)
RBC: 2.74 MIL/uL — AB (ref 3.87–5.11)
RDW: 17.6 % — ABNORMAL HIGH (ref 11.5–15.5)
WBC: 11.5 10*3/uL — AB (ref 4.0–10.5)

## 2014-12-14 LAB — GLUCOSE, CAPILLARY: GLUCOSE-CAPILLARY: 161 mg/dL — AB (ref 70–99)

## 2014-12-14 LAB — MRSA PCR SCREENING: MRSA BY PCR: NEGATIVE

## 2014-12-14 MED ORDER — SODIUM CHLORIDE 0.9 % IJ SOLN
3.0000 mL | INTRAMUSCULAR | Status: DC | PRN
  Administered 2014-12-19: 3 mL via INTRAVENOUS
  Filled 2014-12-14: qty 3

## 2014-12-14 MED ORDER — DIVALPROEX SODIUM 250 MG PO DR TAB
250.0000 mg | DELAYED_RELEASE_TABLET | Freq: Three times a day (TID) | ORAL | Status: DC
  Administered 2014-12-14 – 2014-12-20 (×17): 250 mg via ORAL
  Filled 2014-12-14 (×19): qty 1

## 2014-12-14 MED ORDER — CETYLPYRIDINIUM CHLORIDE 0.05 % MT LIQD
7.0000 mL | Freq: Two times a day (BID) | OROMUCOSAL | Status: DC
  Administered 2014-12-15 – 2014-12-18 (×8): 7 mL via OROMUCOSAL

## 2014-12-14 MED ORDER — LOSARTAN POTASSIUM 50 MG PO TABS
50.0000 mg | ORAL_TABLET | Freq: Every day | ORAL | Status: DC
  Administered 2014-12-15 – 2014-12-20 (×6): 50 mg via ORAL
  Filled 2014-12-14 (×6): qty 1

## 2014-12-14 MED ORDER — VANCOMYCIN HCL 10 G IV SOLR
1250.0000 mg | INTRAVENOUS | Status: DC
  Administered 2014-12-15: 1250 mg via INTRAVENOUS
  Filled 2014-12-14 (×2): qty 1250

## 2014-12-14 MED ORDER — INSULIN ASPART 100 UNIT/ML ~~LOC~~ SOLN
0.0000 [IU] | Freq: Three times a day (TID) | SUBCUTANEOUS | Status: DC
  Administered 2014-12-15: 2 [IU] via SUBCUTANEOUS
  Administered 2014-12-18: 1 [IU] via SUBCUTANEOUS

## 2014-12-14 MED ORDER — ONDANSETRON HCL 4 MG/2ML IJ SOLN
4.0000 mg | Freq: Four times a day (QID) | INTRAMUSCULAR | Status: DC | PRN
Start: 2014-12-14 — End: 2014-12-20

## 2014-12-14 MED ORDER — PANTOPRAZOLE SODIUM 40 MG PO TBEC
40.0000 mg | DELAYED_RELEASE_TABLET | ORAL | Status: DC
  Administered 2014-12-15 – 2014-12-19 (×3): 40 mg via ORAL
  Filled 2014-12-14 (×4): qty 1

## 2014-12-14 MED ORDER — CHLORHEXIDINE GLUCONATE 0.12 % MT SOLN
15.0000 mL | Freq: Two times a day (BID) | OROMUCOSAL | Status: DC
  Administered 2014-12-15 – 2014-12-19 (×9): 15 mL via OROMUCOSAL
  Filled 2014-12-14 (×15): qty 15

## 2014-12-14 MED ORDER — POTASSIUM CHLORIDE CRYS ER 20 MEQ PO TBCR
20.0000 meq | EXTENDED_RELEASE_TABLET | Freq: Every day | ORAL | Status: DC
  Administered 2014-12-15 – 2014-12-20 (×5): 20 meq via ORAL
  Filled 2014-12-14 (×7): qty 1

## 2014-12-14 MED ORDER — LEVOTHYROXINE SODIUM 175 MCG PO TABS
175.0000 ug | ORAL_TABLET | Freq: Every day | ORAL | Status: DC
  Administered 2014-12-15 – 2014-12-20 (×6): 175 ug via ORAL
  Filled 2014-12-14 (×8): qty 1

## 2014-12-14 MED ORDER — SODIUM CHLORIDE 0.9 % IV SOLN: 250.0000 mL | INTRAVENOUS | Status: DC | PRN

## 2014-12-14 MED ORDER — DOCUSATE SODIUM 100 MG PO CAPS
100.0000 mg | ORAL_CAPSULE | Freq: Two times a day (BID) | ORAL | Status: DC
  Administered 2014-12-14 – 2014-12-20 (×12): 100 mg via ORAL
  Filled 2014-12-14 (×15): qty 1

## 2014-12-14 MED ORDER — RIVASTIGMINE 4.6 MG/24HR TD PT24
4.6000 mg | MEDICATED_PATCH | Freq: Every morning | TRANSDERMAL | Status: DC
Start: 2014-12-15 — End: 2014-12-20
  Administered 2014-12-15 – 2014-12-20 (×6): 4.6 mg via TRANSDERMAL
  Filled 2014-12-14 (×6): qty 1

## 2014-12-14 MED ORDER — ALBUTEROL SULFATE (2.5 MG/3ML) 0.083% IN NEBU
2.5000 mg | INHALATION_SOLUTION | RESPIRATORY_TRACT | Status: DC | PRN
  Administered 2014-12-19: 2.5 mg via RESPIRATORY_TRACT
  Filled 2014-12-14: qty 3

## 2014-12-14 MED ORDER — DABIGATRAN ETEXILATE MESYLATE 150 MG PO CAPS
150.0000 mg | ORAL_CAPSULE | Freq: Two times a day (BID) | ORAL | Status: DC
  Administered 2014-12-14 – 2014-12-16 (×4): 150 mg via ORAL
  Filled 2014-12-14 (×5): qty 1

## 2014-12-14 MED ORDER — POTASSIUM CHLORIDE CRYS ER 20 MEQ PO TBCR
40.0000 meq | EXTENDED_RELEASE_TABLET | Freq: Once | ORAL | Status: AC
  Administered 2014-12-14: 40 meq via ORAL
  Filled 2014-12-14: qty 2

## 2014-12-14 MED ORDER — ACETAMINOPHEN 650 MG RE SUPP: 650.0000 mg | Freq: Four times a day (QID) | RECTAL | Status: DC | PRN

## 2014-12-14 MED ORDER — ONDANSETRON HCL 4 MG PO TABS: 4.0000 mg | ORAL_TABLET | Freq: Four times a day (QID) | ORAL | Status: DC | PRN

## 2014-12-14 MED ORDER — FUROSEMIDE 40 MG PO TABS
40.0000 mg | ORAL_TABLET | Freq: Every day | ORAL | Status: DC
  Administered 2014-12-15 – 2014-12-20 (×6): 40 mg via ORAL
  Filled 2014-12-14 (×6): qty 1

## 2014-12-14 MED ORDER — CLONAZEPAM 0.5 MG PO TABS
0.5000 mg | ORAL_TABLET | Freq: Two times a day (BID) | ORAL | Status: DC | PRN
  Administered 2014-12-15 – 2014-12-17 (×2): 0.5 mg via ORAL
  Filled 2014-12-14 (×2): qty 1

## 2014-12-14 MED ORDER — CARVEDILOL 6.25 MG PO TABS
6.2500 mg | ORAL_TABLET | Freq: Two times a day (BID) | ORAL | Status: DC
  Administered 2014-12-15 – 2014-12-20 (×11): 6.25 mg via ORAL
  Filled 2014-12-14 (×15): qty 1

## 2014-12-14 MED ORDER — HALOPERIDOL 0.5 MG PO TABS
0.5000 mg | ORAL_TABLET | Freq: Three times a day (TID) | ORAL | Status: DC | PRN
  Filled 2014-12-14: qty 1

## 2014-12-14 MED ORDER — TRAZODONE HCL 100 MG PO TABS
100.0000 mg | ORAL_TABLET | Freq: Every day | ORAL | Status: DC
  Administered 2014-12-14 – 2014-12-19 (×6): 100 mg via ORAL
  Filled 2014-12-14: qty 2
  Filled 2014-12-14 (×7): qty 1

## 2014-12-14 MED ORDER — ACETAMINOPHEN 325 MG PO TABS
650.0000 mg | ORAL_TABLET | Freq: Four times a day (QID) | ORAL | Status: DC | PRN
Start: 2014-12-14 — End: 2014-12-20
  Administered 2014-12-15 – 2014-12-17 (×2): 650 mg via ORAL
  Filled 2014-12-14 (×2): qty 2

## 2014-12-14 MED ORDER — OLANZAPINE 10 MG PO TABS
10.0000 mg | ORAL_TABLET | Freq: Every day | ORAL | Status: DC
  Administered 2014-12-14 – 2014-12-19 (×6): 10 mg via ORAL
  Filled 2014-12-14 (×7): qty 1

## 2014-12-14 MED ORDER — IPRATROPIUM-ALBUTEROL 0.5-2.5 (3) MG/3ML IN SOLN
3.0000 mL | Freq: Three times a day (TID) | RESPIRATORY_TRACT | Status: DC
  Administered 2014-12-14 – 2014-12-20 (×15): 3 mL via RESPIRATORY_TRACT
  Filled 2014-12-14 (×16): qty 3

## 2014-12-14 MED ORDER — SODIUM CHLORIDE 0.9 % IJ SOLN
3.0000 mL | Freq: Two times a day (BID) | INTRAMUSCULAR | Status: DC
  Administered 2014-12-14 – 2014-12-20 (×8): 3 mL via INTRAVENOUS

## 2014-12-14 MED ORDER — VANCOMYCIN HCL IN DEXTROSE 1-5 GM/200ML-% IV SOLN
1000.0000 mg | Freq: Once | INTRAVENOUS | Status: AC
  Administered 2014-12-14: 1000 mg via INTRAVENOUS
  Filled 2014-12-14: qty 200

## 2014-12-14 NOTE — ED Notes (Signed)
Per EMS husband reports wife/pt diagnoses with blood clot in left leg last week. Husband request reevaluation for increased swelling to left leg since yesterday.   VS with EMS  116/60 BP 84 HR 16 RR 95% O2 RA

## 2014-12-14 NOTE — H&P (Signed)
[facility] History and Physical  Paula [name] AUQ:[id number] DOB: 06/05/1927 DOA: 12/14/2014   PCP: Ria Bush, MD    Chief Complaint: Worsening redness and swelling of the left leg  HPI: Paula [name] is a 81 y.o. female who lives in a skilled nursing facility and who was discharged on Jan 21st after being managed for a new DVT in the left lower extremity along with superimposed cellulitis. Patient also has a past medical history of COPD, dementia, diabetes mellitus, chronic congestive heart failure, diastolic. She has very poor functioning at baseline. She is wheelchair bound at the skilled nursing facility. She presented in January with complaints of left leg pain, swelling and redness. She was found to have a DVT along with cellulitis. She was treated with intravenous vancomycin initially and transitioned to Doxycycline on discharge. She was started on oral anticoagulants and was discharged back to her skilled nursing facility. She is accompanied by her husband today who mentioned that since Sunday, her leg has been getting worse. He tells me that with the antibiotics the leg did almost cleared up. The redness had resolved. However, since Sunday he has noticed the redness has recurred. He also noticed worsening swelling in that leg. Patient denies any chest pain. She has chronic shortness of breath due to her COPD. No fever, no chills, no nausea, vomiting. No difficulty with bowel movements. She has noticed some burning sensation with urination recently but that has improved. She has a history of dementia so history is limited.  Home Medications: Prior to Admission medications   Medication Sig Start Date End Date Taking? Authorizing Provider  acetaminophen (TYLENOL) 500 MG tablet Take 500 mg by mouth every 6 (six) hours as needed (pain).   Yes Historical Provider, MD  albuterol (PROVENTIL HFA;VENTOLIN HFA) 108 (90 BASE) MCG/ACT inhaler Inhale 2 puffs into the lungs every 6 (six)  hours as needed for wheezing or shortness of breath. 03/24/14  Yes Rowe Clack, MD  alum & mag hydroxide-simeth (MAALOX/MYLANTA) 200-200-20 MG/5ML suspension Take 30 mLs by mouth every 4 (four) hours as needed for indigestion or heartburn.   Yes Historical Provider, MD  Calcium Carbonate-Vitamin D (CALCARB 600/D) 600-400 MG-UNIT per tablet Take 1 tablet by mouth every other day.    Yes Historical Provider, MD  carvedilol (COREG) 6.25 MG tablet Take 6.25 mg by mouth 2 (two) times daily with a meal.   Yes Historical Provider, MD  Cholecalciferol (VITAMIN D3) 5000 UNITS TABS Take 5,000 Units by mouth every other day.    Yes Historical Provider, MD  clonazePAM (KLONOPIN) 0.5 MG tablet Take 1 tablet (0.5 mg total) by mouth 2 (two) times daily as needed for anxiety. 12/01/14  Yes Barton Dubois, MD  cyanocobalamin 500 MCG tablet Take 500 mcg by mouth daily.   Yes Historical Provider, MD  dabigatran (PRADAXA) 150 MG CAPS capsule Take 150 mg by mouth 2 (two) times daily.   Yes Historical Provider, MD  dextromethorphan (DELSYM) 30 MG/5ML liquid Take 10 mLs by mouth at bedtime as needed for cough.   Yes Historical Provider, MD  divalproex (DEPAKOTE) 250 MG DR tablet Take 250 mg by mouth 3 (three) times daily.   Yes Historical Provider, MD  doxycycline (VIBRA-TABS) 100 MG tablet Take 1 tablet (100 mg total) by mouth every 12 (twelve) hours. 12/01/14  Yes Barton Dubois, MD  fluticasone [facility]) 50 MCG/ACT nasal spray Place 1 spray into both nostrils daily. 04/15/14  Yes Ria Bush, MD  furosemide (LASIX) 20 MG  tablet Take 2 tablets (40 mg total) by mouth daily. 08/18/14  Yes Barton Dubois, MD  guaifenesin (ROBITUSSIN) 100 MG/5ML syrup Take 200 mg by mouth every 6 (six) hours as needed for cough.   Yes Historical Provider, MD  haloperidol (HALDOL) 0.5 MG tablet Take 1 tablet (0.5 mg total) by mouth every 8 (eight) hours as needed for agitation. 08/18/14  Yes Barton Dubois, MD  ipratropium-albuterol (DUONEB)  0.5-2.5 (3) MG/3ML SOLN Take 3 mLs by nebulization 3 (three) times daily. 08/18/14  Yes Barton Dubois, MD  levothyroxine (SYNTHROID, LEVOTHROID) 175 MCG tablet Take 175 mcg by mouth daily before breakfast.    Yes Historical Provider, MD  loperamide (IMODIUM) 2 MG capsule Take 2 mg by mouth as needed for diarrhea or loose stools.   Yes Historical Provider, MD  loratadine (CLARITIN) 10 MG tablet Take 10 mg by mouth daily.   Yes Historical Provider, MD  losartan (COZAAR) 50 MG tablet Take 50 mg by mouth daily.   Yes Historical Provider, MD  magnesium hydroxide (MILK OF MAGNESIA) 400 MG/5ML suspension Take 30 mLs by mouth daily as needed for mild constipation.   Yes Historical Provider, MD  metFORMIN (GLUCOPHAGE-XR) 500 MG 24 hr tablet Take 500 mg by mouth daily with breakfast.  12/08/13  Yes Rowe Clack, MD  OLANZapine (ZYPREXA) 10 MG tablet Take 10 mg by mouth at bedtime.   Yes Historical Provider, MD  pantoprazole (PROTONIX) 40 MG tablet Take 40 mg by mouth every other day.    Yes Historical Provider, MD  Polyethyl Glycol-Propyl Glycol (SYSTANE) 0.4-0.3 % SOLN Apply 1 drop to eye 2 (two) times daily as needed (dry eyes).   Yes Historical Provider, MD  potassium chloride SA (K-DUR,KLOR-CON) 20 MEQ tablet Take 20 mEq by mouth daily.   Yes Historical Provider, MD  rivastigmine (EXELON) 4.6 mg/24hr Place 4.6 mg onto the skin every morning.   Yes Historical Provider, MD  Skin Protectants, Misc. (MINERIN) CREA Apply 1 application topically 2 (two) times daily. To lower extremities and feet.   Yes Historical Provider, MD  traZODone (DESYREL) 150 MG tablet Take 150 mg by mouth at bedtime.   Yes Historical Provider, MD  apixaban (ELIQUIS) 5 MG TABS tablet Take 2 tablets (10 mg total) by mouth 2 (two) times daily. 12/01/14 12/07/14  Barton Dubois, MD  apixaban (ELIQUIS) 5 MG TABS tablet Take 1 tablet (5 mg total) by mouth 2 (two) times daily. Patient not taking: Reported on 12/14/2014 12/08/14   Barton Dubois,  MD  divalproex (DEPAKOTE) 125 MG DR tablet Take 2 tablets (250 mg total) by mouth 3 (three) times daily with meals. Patient not taking: Reported on 11/29/2014 08/18/14   Barton Dubois, MD  fexofenadine (ALLEGRA) 180 MG tablet Take 1 tablet (180 mg total) by mouth daily. Patient not taking: Reported on 11/29/2014 04/25/14   Ria Bush, MD  Neomycin-Bacitracin-Polymyxin (TRIPLE ANTIBIOTIC EX) Apply topically as needed (minor skin tears or abrasions.).    Historical Provider, MD    Allergies:  Allergies  Allergen Reactions  . Bactrim [Sulfamethoxazole-Trimethoprim] Anaphylaxis  . Contrast Media [Iodinated Diagnostic Agents] Anaphylaxis and Swelling  . Iodine Anaphylaxis  . Iohexol Anaphylaxis, Shortness Of Breath and Swelling     Desc: PT STATES SHE HAD A SEVERE REACTION TO IV CONRAST WITH THROAT SWELLING AND SOB. SHE WAS ADMITTED TO THE HOSPITAL. SHE HAS NEVER HAD CONTRAST AGAIN.   Marland Kitchen Sulfa Antibiotics Anaphylaxis  . Aricept [Donepezil Hcl] Nausea And Vomiting  . Cymbalta [Duloxetine Hcl] Other (  See Comments)    HALLUCINATIONS    . Delsym [Dextromethorphan] Other (See Comments)    Hallucination  . Guaifenesin & Derivatives Other (See Comments)    Hallucination  . Hydromet [Hydrocodone-Homatropine] Other (See Comments)    hallucinations  . Sodium Benzoate [Nutritional Supplements]     Per mar  . Trimethoprim     From mar  . Avelox [Moxifloxacin Hcl In Nacl] Rash  . Ciprofloxacin Itching  . Gadolinium Derivatives Other (See Comments)    Unknown    . Penicillins Rash    Rash as a child  . Quinolones Rash    Past Medical History: Past Medical History  Diagnosis Date  . Depression   . Ejection fraction     EF 40%, echo, November, 2012  / in improved, EF 60%, echo, December, 2012  . Contrast media allergy     Patient feels poorly with contrast  . Status post AAA (abdominal aortic aneurysm) repair     Surgical repair, Dr. Kellie Simmering, December 27, 2011  . Pre-syncope      vasovagal w/ heart block  . Parotid mass     has refused further eval 10/2011  . GERD (gastroesophageal reflux disease)   . Anxiety   . Facial tic     L sided spasms - Botox trial summer 2013, ?effective  . ARMD (age related macular degeneration) 06/2014    bilateral vision loss - legally blind  . AAA (abdominal aortic aneurysm)     s/p repair 12/2011  . Congestive heart failure     NL EF 10/2011 echo   . Diabetes mellitus type II, controlled   . Hypothyroidism   . Sinus bradycardia 09/19/2011    Occurring simultaneously with complete heart block   . Hypertension   . COPD (chronic obstructive pulmonary disease)   . Facial spasm     L hemifacial-treated w/ botox xeomin -Dr Krista Blue  . Dementia     MRI with progressive chronic microvascular ischemia and generalized cerebral atrophy  . Lumbosacral spondylosis without myelopathy   . Legally blind 06/2014    due to ARMD    Past Surgical History  Procedure Laterality Date  . Cholecystectomy    . Knee cartilage surgery      left  . Sinus surgery with instatrak    . Appendectomy    . Oophorectomy      ovarian cyst  . Cataract extraction      bilateral  . Bladder repair    . US echocardiography  10/14/11  . Abdominal aortic aneurysm repair  12/27/2011    ANEURYSM ABDOMINAL AORTIC REPAIR;  Surgeon: Tinnie Gens, MD; Resection and Grafting Abdominal Aortic Aneurysm , Aorta Bi Iliac.  Marland Kitchen Pacemaker insertion  11/12  . Cardiac catheterization  11/04/11  . Temporary pacemaker insertion N/A 09/19/2011    Procedure: TEMPORARY PACEMAKER INSERTION;  Surgeon: Jolaine Artist, MD;  Location: [facility] CATH LAB;  Service: Cardiovascular;  Laterality: N/A;  . Left heart catheterization with coronary angiogram N/A 11/04/2011    Procedure: LEFT HEART CATHETERIZATION WITH CORONARY ANGIOGRAM;  Surgeon: Larey Dresser, MD;  Location[facility] C[facility] LAB;  Service: Cardiovascular;  Laterality: N/A;    Social History: She lives in a skilled nursing facility. Poor  functioning at baseline. She is wheelchair bound. No history is smoking currently. She quit in 2012. No alcohol use. No illicit drug use.  Family History:  Family History  Problem Relation Age of Onset  . Esophageal cancer Father   . Cancer  Father   . Breast cancer Sister   . Cancer Sister   . Colon cancer Sister   . Heart disease Sister   . Heart disease Mother   . Heart disease Son   . Parkinson's disease Sister     younger     Review of Systems - Unable to obtain due to her dementia  Physical Examination  Filed Vitals:   12/14/14 1716 12/14/14 1718 12/14/14 1801 12/14/14 1856  BP: 111/40 105/40 119/45 119/47  Pulse: 82  85 87  Temp:      TempSrc:      Resp: 24  25 22   Height:      Weight:      SpO2: 95%  93% 94%    BP 119/47 mmHg  Pulse 87  Temp(Src) 98.4 F (36.9 C) (Oral)  Resp 22  Ht 5' 5"  (1.651 m)  Wt 81.647 kg (180 lb)  BMI 29.95 kg/m2  SpO2 94%  General appearance: alert, cooperative, appears stated age, distracted, no distress and moderately obese Head: Normocephalic, without obvious abnormality, atraumatic Eyes: conjunctivae/corneas clear. PERRL, EOM's intact.  Throat: lips, mucosa, and tongue normal; teeth and gums normal Neck: no adenopathy, no carotid bruit, no JVD, supple, symmetrical, trachea midline and thyroid not enlarged, symmetric, no tenderness/mass/nodules Resp: clear to auscultation bilaterally Cardio: regular rate and rhythm, S1, S2 normal, no murmur, click, rub or gallop GI: soft, non-tender; bowel sounds normal; no masses,  no organomegaly Extremities: Left lower extremity is quite edematous. Pitting is noted. There is erythema over the anterior aspect of the lower leg. Peripheral pulses are palpable. Pulses: 2+ and symmetric Skin: Erythema over left anterior lower leg Lymph nodes: Cervical, supraclavicular, and axillary nodes normal. Neurologic: Alert. Oriented to person, place. No focal neurological deficits.  Laboratory  Data: Results for orders placed or performed during the hospital encounter of 12/14/14 (from the past 48 hour(s))  CBC with Differential/Platelet     Status: Abnormal   Collection Time: 12/14/14  3:40 PM  Result Value Ref Range   WBC 11.5 (H) 4.0 - 10.5 K/uL   RBC 2.74 (L) 3.87 - 5.11 MIL/uL   Hemoglobin 9.4 (L) 12.0 - 15.0 g/dL   HCT 30.1 (L) 36.0 - 46.0 %   MCV 109.9 (H) 78.0 - 100.0 fL   MCH 34.3 (H) 26.0 - 34.0 pg   MCHC 31.2 30.0 - 36.0 g/dL   RDW 17.6 (H) 11.5 - 15.5 %   Platelets 291 150 - 400 K/uL   Neutrophils Relative % 62 43 - 77 %   Neutro Abs 7.0 1.7 - 7.7 K/uL   Lymphocytes Relative 26 12 - 46 %   Lymphs Abs 3.0 0.7 - 4.0 K/uL   Monocytes Relative 8 3 - 12 %   Monocytes Absolute 1.0 0.1 - 1.0 K/uL   Eosinophils Relative 4 0 - 5 %   Eosinophils Absolute 0.5 0.0 - 0.7 K/uL   Basophils Relative 0 0 - 1 %   Basophils Absolute 0.0 0.0 - 0.1 K/uL  Basic metabolic panel     Status: Abnormal   Collection Time: 12/14/14  3:40 PM  Result Value Ref Range   Sodium 142 135 - 145 mmol/L   Potassium 3.4 (L) 3.5 - 5.1 mmol/L   Chloride 105 96 - 112 mmol/L   CO2 28 19 - 32 mmol/L   Glucose, Bld 113 (H) 70 - 99 mg/dL   BUN 10 6 - 23 mg/dL   Creatinine, Ser 0.86 0.50 - 1.10  mg/dL   Calcium 9.0 8.4 - 10.5 mg/dL   GFR calc non Af Amer 60 (L) >90 mL/min   GFR calc Af Amer 69 (L) >90 mL/min    Comment: (NOTE) The eGFR has been calculated using the CKD EPI equation. This calculation has not been validated in all clinical situations. eGFR's persistently <90 mL/min signify possible Chronic Kidney Disease.    Anion gap 9 5 - 15  Protime-INR     Status: Abnormal   Collection Time: 12/14/14  3:40 PM  Result Value Ref Range   Prothrombin Time 16.8 (H) 11.6 - 15.2 seconds   INR 1.34 0.00 - 1.49    Radiology Reports: No results found.   Problem List  Principal Problem:   Cellulitis of left lower extremity Active Problems:   Hypothyroidism   Chronic diastolic CHF (congestive  heart failure)   Dementia arising in the senium and presenium   DVT (deep venous thrombosis)   Diabetes mellitus type 2, controlled   Post-phlebitic syndrome   Assessment: This is of 81 year old Caucasian female who presents with recurrence of redness in the left lower extremity. This is along with worsening swelling. Patient could have recurrence of her cellulitis. However, post phlebitis syndrome is a distinct possibility. Progression of her clot is also possibility.  Plan: #1 Swelling of the left lower extremity in the setting of known DVT along with erythema: Could be a recurrence of her cellulitis. We will place her back on intravenous vancomycin for now. She's allergic to multiple antibiotics. Vancomycin appear to have helped during last admission, so we will continue just with that agent. We will also get the repeat Doppler studies of the left lower extremity. It is quite likely she could have post phlebitis syndrome in which case she should be treated with TED stockings. She's not really a candidate for any kind of surgical intervention. We will wait for venous Doppler.  #2 Known DVT of the left lower extremity: When she was hospitalized in January she was treated with Eliquis, however, now she is on Pradaxa due to insurance issues. She denies any chest pain or worsening shortness of breath. Pharmacy to review Pradaxa dosing.  #3 Possible recurrence of her left lower extremity cellulitis: Treat with intravenous vancomycin as discussed earlier. She was discharged on doxycycline.  #4 history of diabetes mellitus type 2: Continue sliding scale coverage. Hold her metformin for now. No recent HbA1c. It was 6.1 in October.  #5 history of hypothyroidism: Continue with Synthroid.  #6 history of chronic diastolic CHF: She is appears to be well compensated. Continue with her home dose of Lasix. Replace her potassium.  #7 Complains of dysuria: Order UA.   DVT Prophylaxis: Continue with her  Pradaxa Code Status: CODE STATUS discussed in detail with the patient and her husband. She is a DO NOT RESUSCITATE Family Communication: Discussed with the patient's husband  Disposition Plan: Admit to MedSurg   Further management decisions will depend on results of further testing and patient's response to treatment.   [facility]  [facility] Pager [phone]  If 7PM-7AM, please contact night-coverage [email or web address] Password [email or web address]  12/14/2014, 7:16 PM

## 2014-12-14 NOTE — ED Notes (Signed)
Bed: [id number] Expected date:  Expected time:  Means of arrival:  [facility]

## 2014-12-14 NOTE — Progress Notes (Signed)
ANTIBIOTIC CONSULT NOTE - INITIAL  Pharmacy Consult for Vancomycin Indication: Cellulitis  Allergies  Allergen Reactions  . Bactrim [Sulfamethoxazole-Trimethoprim] Anaphylaxis  . Contrast Media [Iodinated Diagnostic Agents] Anaphylaxis and Swelling  . Iodine Anaphylaxis  . Iohexol Anaphylaxis, Shortness Of Breath and Swelling     Desc: PT STATES SHE HAD A SEVERE REACTION TO IV CONRAST WITH THROAT SWELLING AND SOB. SHE WAS ADMITTED TO THE HOSPITAL. SHE HAS NEVER HAD CONTRAST AGAIN.   Marland Kitchen. Sulfa Antibiotics Anaphylaxis  . Aricept [Donepezil Hcl] Nausea And Vomiting  . Cymbalta [Duloxetine Hcl] Other (See Comments)    HALLUCINATIONS    . Delsym [Dextromethorphan] Other (See Comments)    Hallucination  . Guaifenesin & Derivatives Other (See Comments)    Hallucination  . Hydromet [Hydrocodone-Homatropine] Other (See Comments)    hallucinations  . Sodium Benzoate [Nutritional Supplements]     Per mar  . Trimethoprim     From mar  . Avelox [Moxifloxacin Hcl In Nacl] Rash  . Ciprofloxacin Itching  . Gadolinium Derivatives Other (See Comments)    Unknown    . Penicillins Rash    Rash as a child  . Quinolones Rash    Patient Measurements: Height: 5\' 5"  (165.1 cm) Weight: 172 lb 8 oz (78.245 kg) IBW/kg (Calculated) : 57  Vital Signs: Temp: 99.2 F (37.3 C) (02/03 1939) Temp Source: Oral (02/03 1939) BP: 121/54 mmHg (02/03 1939) Pulse Rate: 87 (02/03 1939) Intake/Output from previous day:   Intake/Output from this shift:    Labs:  Recent Labs  12/14/14 1540  WBC 11.5*  HGB 9.4*  PLT 291  CREATININE 0.86   Estimated Creatinine Clearance: 49.5 mL/min (by C-G formula based on Cr of 0.86). No results for input(s): VANCOTROUGH, VANCOPEAK, VANCORANDOM, GENTTROUGH, GENTPEAK, GENTRANDOM, TOBRATROUGH, TOBRAPEAK, TOBRARND, AMIKACINPEAK, AMIKACINTROU, AMIKACIN in the last 72 hours.   Microbiology: No results found for this or any previous visit (from the past 720  hour(s)).  Medical History: Past Medical History  Diagnosis Date  . Depression   . Ejection fraction     EF 40%, echo, November, 2012  / in improved, EF 60%, echo, December, 2012  . Contrast media allergy     Patient feels poorly with contrast  . Status post AAA (abdominal aortic aneurysm) repair     Surgical repair, Dr. Hart RochesterLawson, December 27, 2011  . Pre-syncope     vasovagal w/ heart block  . Parotid mass     has refused further eval 10/2011  . GERD (gastroesophageal reflux disease)   . Anxiety   . Facial tic     L sided spasms - Botox trial summer 2013, ?effective  . ARMD (age related macular degeneration) 06/2014    bilateral vision loss - legally blind  . AAA (abdominal aortic aneurysm)     s/p repair 12/2011  . Congestive heart failure     NL EF 10/2011 echo   . Diabetes mellitus type II, controlled   . Hypothyroidism   . Sinus bradycardia 09/19/2011    Occurring simultaneously with complete heart block   . Hypertension   . COPD (chronic obstructive pulmonary disease)   . 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 Dementia     MRI with progressive chronic microvascular ischemia and generalized cerebral atrophy  . Lumbosacral spondylosis without myelopathy   . Legally blind 06/2014    due to ARMD    Medications:  Scheduled:  . [START ON 12/15/2014] carvedilol  6.25  mg Oral BID WC  . dabigatran  150 mg Oral BID  . divalproex  250 mg Oral TID  . docusate sodium  100 mg Oral BID  . [START ON 12/15/2014] furosemide  40 mg Oral Daily  . [START ON 12/15/2014] insulin aspart  0-9 Units Subcutaneous TID WC  . ipratropium-albuterol  3 mL Nebulization TID  . [START ON 12/15/2014] levothyroxine  175 mcg Oral QAC breakfast  . [START ON 12/15/2014] losartan  50 mg Oral Daily  . OLANZapine  10 mg Oral QHS  . pantoprazole  40 mg Oral QODAY  . [START ON 12/15/2014] potassium chloride SA  20 mEq Oral Daily  . potassium chloride  40 mEq Oral Once  . [START ON 12/15/2014]  rivastigmine  4.6 mg Transdermal q morning - 10a  . sodium chloride  3 mL Intravenous Q12H  . traZODone  100 mg Oral QHS  . vancomycin  1,000 mg Intravenous Once   Infusions:   PRN: sodium chloride, acetaminophen **OR** acetaminophen, albuterol, clonazePAM, haloperidol, ondansetron **OR** ondansetron (ZOFRAN) IV, sodium chloride  Assessment: 81 y.o. female who lives in a skilled nursing facility and who was discharged on Jan 21st after being managed for a new DVT in the left lower extremity along with superimposed cellulitis. She is re-admitted 2/3 with worsening redness and swelling of the left leg. Per MD, vancomycin appeared to have helped the cellulitis during previous admission and patient has multiple antibiotic allergies. Pharmacy is therefore consulted to dose vancomycin.  Tmax: 99.2 WBC: 11.5k Renal: SCr 0.86, CrCl 49 CG, 53 N  2/3 blood x 2: sent  Goal of Therapy:  Vancomycin trough level 10-15 mcg/ml  Plan:   Vancomycin 1g IV given in ED  Continue with vancomycin  IV q24h Check trough at steady state Follow up renal function & cultures, clinical course  Loralee Pacas, PharmD, BCPS Pager: [phone] 12/14/2014,7:48 PM

## 2014-12-14 NOTE — ED Provider Notes (Signed)
CSN: [id number]     Arrival date & time 12/14/14  1450 History   First MD Initiated Contact with Patient 12/14/14 1503     Chief Complaint  Patient presents with  . Leg Swelling     (Consider location/radiation/quality/duration/timing/severity/associated sxs/prior Treatment) Patient is a 81 y.o. female presenting with leg pain.  Leg Pain Location:  Leg Leg location:  L leg Pain details:    Severity:  Mild   Onset quality:  Gradual   Duration:  2 days   Timing:  Constant   Progression:  Worsening Chronicity:  Recurrent Relieved by:  Nothing Ineffective treatments: antibiotics and blood thinners.  pt and husband don't know which ones.   Associated symptoms: swelling   Associated symptoms: no fever   Associated symptoms comment:  Redness   Past Medical History  Diagnosis Date  . Depression   . Ejection fraction     EF 40%, echo, November, 2012  / in improved, EF 60%, echo, December, 2012  . Contrast media allergy     Patient feels poorly with contrast  . Status post AAA (abdominal aortic aneurysm) repair     Surgical repair, Dr. Hart Rochester, December 27, 2011  . Pre-syncope     vasovagal w/ heart block  . Parotid mass     has refused further eval 10/2011  . GERD (gastroesophageal reflux disease)   . Anxiety   . Facial tic     L sided spasms - Botox trial summer 2013, ?effective  . ARMD (age related macular degeneration) 06/2014    bilateral vision loss - legally blind  . AAA (abdominal aortic aneurysm)     s/p repair 12/2011  . Congestive heart failure     NL EF 10/2011 echo   . Diabetes mellitus type II, controlled   . Hypothyroidism   . Sinus bradycardia 09/19/2011    Occurring simultaneously with complete heart block   . Hypertension   . COPD (chronic obstructive pulmonary disease)   . Facial spasm     L hemifacial-treated w/ botox xeomin -Dr Terrace Arabia  . Dementia     MRI with progressive chronic microvascular ischemia and generalized cerebral atrophy  . Lumbosacral  spondylosis without myelopathy   . Legally blind 06/2014    due to ARMD   Past Surgical History  Procedure Laterality Date  . Cholecystectomy    . Knee cartilage surgery      left  . Sinus surgery with instatrak    . Appendectomy    . Oophorectomy      ovarian cyst  . Cataract extraction      bilateral  . Bladder repair    . US echocardiography  10/14/11  . Abdominal aortic aneurysm repair  12/27/2011    ANEURYSM ABDOMINAL AORTIC REPAIR;  Surgeon: Josephina Gip, MD; Resection and Grafting Abdominal Aortic Aneurysm , Aorta Bi Iliac.  Marland Kitchen Pacemaker insertion  11/12  . Cardiac catheterization  11/04/11  . Temporary pacemaker insertion N/A 09/19/2011    Procedure: TEMPORARY PACEMAKER INSERTION;  Surgeon: Dolores Patty, MD;  Location: [facility] C[facility] LAB;  Service: Cardiovascular;  Laterality: N/A;  . Left heart catheterization with coronary angiogram N/A 11/04/2011    Procedure: LEFT HEART CATHETERIZATION WITH CORONARY ANGIOGRAM;  Surgeon: Laurey Morale, MD;  Location: [facility] CATH LAB;  Service: Cardiovascular;  Laterality: N/A;   Family History  Problem Relation Age of Onset  . Esophageal cancer Father   . Cancer Father   . Breast cancer Sister   . Cancer  Sister   . Colon cancer Sister   . Heart disease Sister   . Heart disease Mother   . Heart disease Son   . Parkinson's disease Sister     younger   History  Substance Use Topics  . Smoking status: Former Smoker -- 1.00 packs/day for 60 years    Types: Cigarettes    Quit date: 08/31/2011  . Smokeless tobacco: Never Used  . Alcohol Use: No   OB History    No data available     Review of Systems  Constitutional: Negative for fever.  Respiratory: Negative for shortness of breath.   Cardiovascular: Negative for chest pain.  Gastrointestinal: Negative for nausea, vomiting, abdominal pain and diarrhea.  All other systems reviewed and are negative.     Allergies  Bactrim; Contrast media; Iodine; Iohexol; Sulfa antibiotics;  Aricept; Cymbalta; Delsym; Guaifenesin & derivatives; Hydromet; Sodium benzoate; Trimethoprim; Avelox; Ciprofloxacin; Gadolinium derivatives; Penicillins; and Quinolones  Home Medications   Prior to Admission medications   Medication Sig Start Date End Date Taking? Authorizing Provider  acetaminophen (TYLENOL) 500 MG tablet Take 500 mg by mouth every 6 (six) hours as needed (pain).    Historical Provider, MD  albuterol (PROVENTIL HFA;VENTOLIN HFA) 108 (90 BASE) MCG/ACT inhaler Inhale 2 puffs into the lungs every 6 (six) hours as needed for wheezing or shortness of breath. 03/24/14   Newt LukesValerie A Leschber, MD  alum & mag hydroxide-simeth (MAALOX/MYLANTA) 200-200-20 MG/5ML suspension Take 30 mLs by mouth every 4 (four) hours as needed for indigestion or heartburn.    Historical Provider, MD  apixaban (ELIQUIS) 5 MG TABS tablet Take 2 tablets (10 mg total) by mouth 2 (two) times daily. 12/01/14 12/07/14  Vassie Lollarlos Madera, MD  apixaban (ELIQUIS) 5 MG TABS tablet Take 1 tablet (5 mg total) by mouth 2 (two) times daily. 12/08/14   Vassie Lollarlos Madera, MD  baclofen (LIORESAL) 10 MG tablet Take 10 mg by mouth 3 (three) times daily as needed for muscle spasms.    Historical Provider, MD  benzonatate (TESSALON) 100 MG capsule Take 200 mg by mouth at bedtime as needed for cough.    Historical Provider, MD  Calcium Carbonate-Vitamin D (CALCARB 600/D) 600-400 MG-UNIT per tablet Take 1 tablet by mouth every other day.     Historical Provider, MD  carvedilol (COREG) 6.25 MG tablet Take 6.25 mg by mouth 2 (two) times daily with a meal.    Historical Provider, MD  Cholecalciferol (VITAMIN D3) 5000 UNITS TABS Take 5,000 Units by mouth every other day.     Historical Provider, MD  clonazePAM (KLONOPIN) 0.5 MG tablet Take 1 tablet (0.5 mg total) by mouth 2 (two) times daily as needed for anxiety. 12/01/14   Vassie Lollarlos Madera, MD  cyanocobalamin 500 MCG tablet Take 500 mcg by mouth daily.    Historical Provider, MD  divalproex (DEPAKOTE)  125 MG DR tablet Take 2 tablets (250 mg total) by mouth 3 (three) times daily with meals. Patient not taking: Reported on 11/29/2014 08/18/14   Vassie Lollarlos Madera, MD  divalproex (DEPAKOTE) 250 MG DR tablet Take 250 mg by mouth 3 (three) times daily.    Historical Provider, MD  doxycycline (VIBRA-TABS) 100 MG tablet Take 1 tablet (100 mg total) by mouth every 12 (twelve) hours. 12/01/14   Vassie Lollarlos Madera, MD  fexofenadine (ALLEGRA) 180 MG tablet Take 1 tablet (180 mg total) by mouth daily. Patient not taking: Reported on 11/29/2014 04/25/14   Eustaquio BoydenJavier Gutierrez, MD  fluticasone [facility](FLONASE) 50 MCG/ACT nasal spray  Place 1 spray into both nostrils daily. 04/15/14   [name], MD  furosemide (LASIX) 20 MG tablet Take 2 tablets (40 mg total) by mouth daily. 08/18/14   Vassie Loll, MD  guaifenesin (ROBITUSSIN) 100 MG/5ML syrup Take 200 mg by mouth every 6 (six) hours as needed for cough.    Historical Provider, MD  haloperidol (HALDOL) 0.5 MG tablet Take 1 tablet (0.5 mg total) by mouth every 8 (eight) hours as needed for agitation. Patient not taking: Reported on 11/29/2014 08/18/14   Vassie Loll, MD  ipratropium-albuterol (DUONEB) 0.5-2.5 (3) MG/3ML SOLN Take 3 mLs by nebulization 3 (three) times daily. 08/18/14   Vassie Loll, MD  levothyroxine (SYNTHROID, LEVOTHROID) 175 MCG tablet Take 175 mcg by mouth daily before breakfast.     Historical Provider, MD  loperamide (IMODIUM) 2 MG capsule Take 2 mg by mouth as needed for diarrhea or loose stools.    Historical Provider, MD  loratadine (CLARITIN) 10 MG tablet Take 10 mg by mouth daily.    Historical Provider, MD  losartan (COZAAR) 50 MG tablet Take 50 mg by mouth daily.    Historical Provider, MD  magnesium hydroxide (MILK OF MAGNESIA) 400 MG/5ML suspension Take 30 mLs by mouth daily as needed for mild constipation.    Historical Provider, MD  metFORMIN (GLUCOPHAGE-XR) 500 MG 24 hr tablet Take 500 mg by mouth daily with breakfast.  12/08/13   Newt Lukes,  MD  Neomycin-Bacitracin-Polymyxin (TRIPLE ANTIBIOTIC EX) Apply topically as needed (minor skin tears or abrasions.).    Historical Provider, MD  OLANZapine (ZYPREXA) 10 MG tablet Take 10 mg by mouth at bedtime.    Historical Provider, MD  OLANZapine (ZYPREXA) 5 MG tablet Take 5 mg by mouth 2 (two) times daily.    Historical Provider, MD  pantoprazole (PROTONIX) 40 MG tablet Take 40 mg by mouth every other day.     Historical Provider, MD  Polyethyl Glycol-Propyl Glycol (SYSTANE) 0.4-0.3 % SOLN Apply 1 drop to eye 2 (two) times daily as needed (dry eyes).    Historical Provider, MD  potassium chloride SA (K-DUR,KLOR-CON) 20 MEQ tablet Take 20 mEq by mouth daily.    Historical Provider, MD  rivastigmine (EXELON) 4.6 mg/24hr Place 4.6 mg onto the skin every morning.    Historical Provider, MD  traZODone (DESYREL) 150 MG tablet Take 150 mg by mouth at bedtime.    Historical Provider, MD   BP 134/47 mmHg  Pulse 84  Temp(Src) 98.4 F (36.9 C) (Oral)  Resp 21  Ht  (1.651 m)  Wt 180 lb (81.647 kg)  BMI 29.95 kg/m2  SpO2 94% Physical Exam  Constitutional: She is oriented to person, place, and time. She appears well-developed and well-nourished. No distress.  HENT:  Head: Normocephalic and atraumatic.  Eyes: Conjunctivae are normal. No scleral icterus.  Neck: Neck supple.  Cardiovascular: Normal rate and intact distal pulses.   Pulmonary/Chest: Effort normal. No stridor. No respiratory distress.  Abdominal: Normal appearance. She exhibits no distension.  Musculoskeletal:  Left lower extremity : 2+ pitting edema and induration to thigh, 1+ DP pulses, erythema and warmth primarily over lower leg.  No crepitus.  Neurological: She is alert and oriented to person, place, and time.  Skin: Skin is warm and dry. No rash noted.  Psychiatric: She has a normal mood and affect. Her behavior is normal.  Nursing note and vitals reviewed.   ED Course  Procedures (including critical care time) Labs  Review Labs Reviewed  CBC  WITH DIFFERENTIAL/PLATELET - Abnormal; Notable for the following:    WBC 11.5 (*)    RBC 2.74 (*)    Hemoglobin 9.4 (*)    HCT 30.1 (*)    MCV 109.9 (*)    MCH 34.3 (*)    RDW 17.6 (*)    All other components within normal limits  BASIC METABOLIC PANEL - Abnormal; Notable for the following:    Potassium 3.4 (*)    Glucose, Bld 113 (*)    GFR calc non Af Amer 60 (*)    GFR calc Af Amer 69 (*)    All other components within normal limits  PROTIME-INR - Abnormal; Notable for the following:    Prothrombin Time 16.8 (*)    All other components within normal limits  CULTURE, BLOOD (ROUTINE X 2)  CULTURE, BLOOD (ROUTINE X 2)    Imaging Review No results found.   EKG Interpretation None      MDM   Final diagnoses:  Cellulitis of left lower extremity    81 yo female with swelling and redness to left lower extremity.  Her husband says the redness got better after being treated with abx in the hospital.  Redness has returned in the past two days.   She is now taking pradaxa for her blood clot, according to her MAR.  Plan treatment with vancomycin, admit.      Paula Roof, MD 12/14/14 [phone]

## 2014-12-14 NOTE — ED Notes (Signed)
Pt given [place] sandwich per pt request and [place] MD okay.

## 2014-12-14 NOTE — ED Notes (Signed)
Ice water given to pt per request and [facility]

## 2014-12-15 ENCOUNTER — Ambulatory Visit: Payer: Medicare Other | Admitting: Cardiology

## 2014-12-15 LAB — BASIC METABOLIC PANEL
ANION GAP: 8 (ref 5–15)
BUN: 14 mg/dL (ref 6–23)
CO2: 28 mmol/L (ref 19–32)
Calcium: 8.9 mg/dL (ref 8.4–10.5)
Chloride: 107 mmol/L (ref 96–112)
Creatinine, Ser: 0.91 mg/dL (ref 0.50–1.10)
GFR calc Af Amer: 65 mL/min — ABNORMAL LOW (ref >90)
GFR calc non Af Amer: 56 mL/min — ABNORMAL LOW (ref >90)
Glucose, Bld: 92 mg/dL (ref 70–99)
Potassium: 3.9 mmol/L (ref 3.5–5.1)
SODIUM: 143 mmol/L (ref 135–145)

## 2014-12-15 LAB — CBC
HEMATOCRIT: 26.1 % — AB (ref 36.0–46.0)
Hemoglobin: 8.1 g/dL — ABNORMAL LOW (ref 12.0–15.0)
MCH: 33.9 pg (ref 26.0–34.0)
MCHC: 31 g/dL (ref 30.0–36.0)
MCV: 109.2 fL — ABNORMAL HIGH (ref 78.0–100.0)
PLATELETS: 244 10*3/uL (ref 150–400)
RBC: 2.39 MIL/uL — AB (ref 3.87–5.11)
RDW: 17.3 % — ABNORMAL HIGH (ref 11.5–15.5)
WBC: 9.3 10*3/uL (ref 4.0–10.5)

## 2014-12-15 LAB — GLUCOSE, CAPILLARY
GLUCOSE-CAPILLARY: 114 mg/dL — AB (ref 70–99)
Glucose-Capillary: 100 mg/dL — ABNORMAL HIGH (ref 70–99)
Glucose-Capillary: 162 mg/dL — ABNORMAL HIGH (ref 70–99)
Glucose-Capillary: 120 mg/dL — ABNORMAL HIGH (ref 70–99)

## 2014-12-15 NOTE — Progress Notes (Signed)
[facility] PROGRESS NOTE  Paula LarsenSusan R [name] JYN:[id number] DOB: [birth year] DOA: 12/14/2014 PCP: Eustaquio BoydenJavier Gutierrez, MD  Assessment/Plan: Principal Problem:   Cellulitis of left lower extremity / Post-phlebitic syndrome -Currently will treat with vancomycin if no improvement this is most likely changes related to DVT of left lower extremity. Will treat for another 24 hours and reassess next a.m.  Active Problems:   Hypothyroidism -Stable continue patient's Synthroid    Chronic diastolic CHF (congestive heart failure) -Stable and currently compensated continue current medical management    Dementia arising in the senium and presenium -Stable continue current medical management    DVT (deep venous thrombosis) -Patient is currently on pradaxa. Will continue    Diabetes mellitus type 2, controlled - Carb modified diet - Continue SSI  Code Status: DNR Family Communication: Discussed directly with patient Disposition Plan: Pending improvement in condition   Consultants:  None  Procedures:  None  Antibiotics:  Vancomycin  HPI/Subjective: Patient has no new complaints  Objective: Filed Vitals:   12/15/14 1357  BP: 124/52  Pulse: 88  Temp: 98.1 F (36.7 C)  Resp: 21    Intake/Output Summary (Last 24 hours) at 12/15/14 1616 Last data filed at 12/15/14 [id number]  Gross per 24 hour  Intake    100 ml  Output      0 ml  Net    100 ml   Filed Weights   12/14/14 1455 12/14/14 1939  Weight: 81.647 kg (180 lb) 78.245 kg (172 lb 8 oz)    Exam:   General:  Patient in no acute distress, alert and awake  Cardiovascular: S1 and S2 within normal limits, no murmurs  Respiratory: Clear to auscultation bilaterally, no wheezes  Abdomen: Soft, nontender, nondistended  Musculoskeletal: Left lower extremity cellulitis with mild pain on palpation   Data Reviewed: Basic Metabolic Panel:  Recent Labs Lab 12/14/14 1540 12/15/14 0515  NA 142 143  K 3.4* 3.9  CL 105 107   CO2 28 28  GLUCOSE 113* 92  BUN 10 14  CREATININE 0.86 0.91  CALCIUM 9.0 8.9   Liver Function Tests: No results for input(s): AST, ALT, ALKPHOS, BILITOT, PROT, ALBUMIN in the last 168 hours. No results for input(s): LIPASE, AMYLASE in the last 168 hours. No results for input(s): AMMONIA in the last 168 hours. CBC:  Recent Labs Lab 12/14/14 1540 12/15/14 0515  WBC 11.5* 9.3  NEUTROABS 7.0  --   HGB 9.4* 8.1*  HCT 30.1* 26.1*  MCV 109.9* 109.2*  PLT 291 244   Cardiac Enzymes: No results for input(s): CKTOTAL, CKMB, CKMBINDEX, TROPONINI in the last 168 hours. BNP (last 3 results)  Recent Labs  11/29/14 1805  BNP 31.8    ProBNP (last 3 results)  Recent Labs  03/22/14 1054 08/15/14 1735  PROBNP 183.0* 171.4    CBG:  Recent Labs Lab 12/14/14 2140 12/15/14 0722 12/15/14 1128  GLUCAP 161* 100* 162*    Recent Results (from the past 240 hour(s))  Culture, blood (routine x 2)     Status: None (Preliminary result)   Collection Time: 12/14/14  3:40 PM  Result Value Ref Range Status   Specimen Description BLOOD RIGHT ANTECUBITAL  Final   Special Requests BOTTLES DRAWN AEROBIC AND ANAEROBIC [facility] EACH  Final   Culture   Final           BLOOD CULTURE RECEIVED NO GROWTH TO DATE CULTURE WILL BE HELD FOR 5 DAYS BEFORE ISSUING A FINAL NEGATIVE REPORT Performed at [facility]    Report Status PENDING  Incomplete  Culture, blood (routine x 2)     Status: None (Preliminary result)   Collection Time: 12/14/14  3:43 PM  Result Value Ref Range Status   Specimen Description BLOOD RIGHT HAND  Final   Special Requests BOTTLES DRAWN AEROBIC ONLY 2CC  Final   Culture   Final           BLOOD CULTURE RECEIVED NO GROWTH TO DATE CULTURE WILL BE HELD FOR 5 DAYS BEFORE ISSUING A FINAL NEGATIVE REPORT Performed at [facility]    Report Status PENDING  Incomplete  MRSA PCR Screening     Status: None   Collection Time: 12/14/14  7:39 PM  Result Value Ref Range  Status   MRSA by PCR NEGATIVE NEGATIVE Final    Comment:        The GeneXpert MRSA Assay (FDA approved for NASAL specimens only), is one component of a comprehensive MRSA colonization surveillance program. It is not intended to diagnose MRSA infection nor to guide or monitor treatment for MRSA infections.      Studies: No results found.  Scheduled Meds: . antiseptic oral rinse  7 mL Mouth Rinse q12n4p  . carvedilol  6.25 mg Oral BID WC  . chlorhexidine  15 mL Mouth Rinse BID  . dabigatran  150 mg Oral BID  . divalproex  250 mg Oral TID  . docusate sodium  100 mg Oral BID  . furosemide  40 mg Oral Daily  . insulin aspart  0-9 Units Subcutaneous TID WC  . ipratropium-albuterol  3 mL Nebulization TID  . levothyroxine  175 mcg Oral QAC breakfast  . losartan  50 mg Oral Daily  . OLANZapine  10 mg Oral QHS  . pantoprazole  40 mg Oral QODAY  . potassium chloride SA  20 mEq Oral Daily  . rivastigmine  4.6 mg Transdermal q morning - 10a  . sodium chloride  3 mL Intravenous Q12H  . traZODone  100 mg Oral QHS  . vancomycin  1,250 mg Intravenous Q24H   Continuous Infusions:    Time spent: > 35 minutes    Penny Pia  [facility] Pager [phone]. If 7PM-7AM, please contact night-coverage at [email or web address], password [facility] 12/15/2014, 4:16 PM  LOS: 1 day

## 2014-12-15 NOTE — Progress Notes (Addendum)
Clinical Social Work Department CLINICAL SOCIAL WORK PLACEMENT NOTE 12/15/2014  Patient:  Paula LarsenXSOM,Tahara R  Account Number:  [id number] Admit date:  12/14/2014  Clinical Social Worker:  Unk LightningHOLLY Karington Zarazua, LCSW  Date/time:  12/15/2014 02:00 PM  Clinical Social Work is seeking post-discharge placement for this patient at the following level of care:   SKILLED NURSING   (*CSW will update this form in Epic as items are completed)   12/15/2014  [facility]family provided with [facility] Work's list of facilities offering this level of care within the geographic area requested by the patient (or if unable, by the patient's family).  12/15/2014  Patient/family informed of their freedom to choose among providers that offer the needed level of care, that participate in Medicare, Medicaid or managed care program needed by the patient, have an available bed and are willing to accept the patient.  12/15/2014  Patient/family informed of [facility]' ownership interest in [facility], as well as of the fact that they are under no obligation to receive care at this facility.  PASARR submitted to EDS on [facility] reports they filed for PASARR previously and have number for patient but [facility]. [facility] agreeable to accept patient because they have confirmed her # PASARR number received on   FL2 transmitted to all facilities in geographic area requested by pt/family on 12/15/14  FL2 transmitted to all facilities within larger geographic area on   Patient informed that his/her managed care company has contracts with or will negotiate with  certain facilities, including the following:     Patient/family informed of bed offers received:  12/16/14 [facility] Physician recommends and patient chooses bed at    [facility] on  12/20/14 Patient to be transferred to facility by  [facility] Patient and family notified of transfer on 12/20/14 Name of family member notified:  Husband-[name]  The following physician request were entered in Epic:   Additional Comments:

## 2014-12-15 NOTE — Progress Notes (Signed)
UR complete 

## 2014-12-15 NOTE — Progress Notes (Signed)
*  Preliminary Results* Left lower extremity venous duplex completed. Left lower extremity is positive for acute nonocclusive deep vein thrombosis involving the left saphenofemoral junction, common femoral, femoral, profunda femoral, popliteal, and posterior tibial veins. There is no evidence of left Baker's cyst.  12/15/2014 9:54 AM  Gertie FeyMichelle Keison Glendinning, RVT, RDCS, RDMS

## 2014-12-15 NOTE — Progress Notes (Signed)
Clinical Social Work Department BRIEF PSYCHOSOCIAL ASSESSMENT 12/15/2014  Patient:  Paula [name], Paula [name]     Account Number:  [id number]     Admit date:  12/14/2014  Clinical Social Worker:  Earlie Server  Date/Time:  12/15/2014 11:45 AM  Referred by:  Physician  Date Referred:  12/15/2014 Referred for  ALF Placement   Other Referral:   Interview type:  Patient Other interview type:    PSYCHOSOCIAL DATA Living Status:  FACILITY Admitted from facility:   Level of care:  Assisted Living Primary support name:  Mortimer Fries Primary support relationship to patient:  SPOUSE Degree of support available:   Strong    CURRENT CONCERNS Current Concerns  Post-Acute Placement   Other Concerns:    SOCIAL WORK ASSESSMENT / PLAN CSW received referral due to patient being admitted from a facility. CSW reviewed chart and met with patient and husband at bedside. Patient has dementia and did not participate in assessment but allowed husband to answer most questions.    Patient was living at home with husband until about 3 months ago. Husband reports he was unable to provide care for patient and moved patient into [place]. CSW spoke with husband about DC plans and husband reports that patient has been to [facility] in the past as well. Husband reports he wants to wait to see how patient is progressing but is considering SNF vs ALF. CSW left SNF list and explained process. Husband is agreeable to SNF at [facility] if needed.    CSW received a call from [facility] who reports they spoke with husband and could accept patient if desired. CSW faxed FL2 to [facility] and will continue to follow to assist with DC planning.   Assessment/plan status:  Psychosocial Support/Ongoing Assessment of Needs Other assessment/ plan:   Information/referral to community resources:   ALF vs SNF    PATIENT'S/FAMILY'S RESPONSE TO PLAN OF CARE: Patient laying in bed and minimally  engaged during assessment. Patient allowed husband to answer questions. Husband reports he wants what is best for patient and is upset that he had to move her into a facility but knows this was the best decision for patient because he was unable to care for her. Husband is agreeable to ALF or SNF but wants to make final decision after he sees how patient progresses.    [place], [place] [phone]

## 2014-12-16 LAB — URINALYSIS, ROUTINE W REFLEX MICROSCOPIC
BILIRUBIN URINE: NEGATIVE
GLUCOSE, UA: NEGATIVE mg/dL
HGB URINE DIPSTICK: NEGATIVE
KETONES UR: NEGATIVE mg/dL
NITRITE: NEGATIVE
PROTEIN: NEGATIVE mg/dL
SPECIFIC GRAVITY, URINE: 1.022 (ref 1.005–1.030)
Urobilinogen, UA: 0.2 mg/dL (ref 0.0–1.0)
pH: 6 (ref 5.0–8.0)

## 2014-12-16 LAB — URINE MICROSCOPIC-ADD ON

## 2014-12-16 LAB — GLUCOSE, CAPILLARY
GLUCOSE-CAPILLARY: 105 mg/dL — AB (ref 70–99)
Glucose-Capillary: 112 mg/dL — ABNORMAL HIGH (ref 70–99)
Glucose-Capillary: 115 mg/dL — ABNORMAL HIGH (ref 70–99)

## 2014-12-16 MED ORDER — WARFARIN SODIUM 4 MG PO TABS
4.0000 mg | ORAL_TABLET | Freq: Once | ORAL | Status: AC
  Administered 2014-12-16: 4 mg via ORAL
  Filled 2014-12-16: qty 1

## 2014-12-16 MED ORDER — DOXYCYCLINE HYCLATE 100 MG PO TABS
100.0000 mg | ORAL_TABLET | Freq: Two times a day (BID) | ORAL | Status: DC
  Administered 2014-12-16 – 2014-12-20 (×9): 100 mg via ORAL
  Filled 2014-12-16 (×12): qty 1

## 2014-12-16 MED ORDER — ENOXAPARIN SODIUM 80 MG/0.8ML ~~LOC~~ SOLN
80.0000 mg | Freq: Two times a day (BID) | SUBCUTANEOUS | Status: DC
  Administered 2014-12-16 – 2014-12-20 (×8): 80 mg via SUBCUTANEOUS
  Filled 2014-12-16 (×11): qty 0.8

## 2014-12-16 MED ORDER — WARFARIN - PHARMACIST DOSING INPATIENT: Freq: Every day | Status: DC

## 2014-12-16 NOTE — Progress Notes (Signed)
[facility] PROGRESS NOTE  Paula LarsenSusan R [name] WUJ:[id number] DOB: Aug 10, 1927 DOA: 12/14/2014 PCP: Eustaquio BoydenJavier Gutierrez, MD  Assessment/Plan: Principal Problem:   Cellulitis of left lower extremity / Post-phlebitic syndrome - Most likely postphlebitic syndrome as DVT is worse at left lower extremity based on imaging findings. Patient was transitioned from eliquis to pradaxa while at facility. I have discussed case with hematologist and currently recommendations are for Coumadin with Lovenox bridging. - Will transition to doxycycline  Active Problems:   Hypothyroidism -Stable continue patient's Synthroid    Chronic diastolic CHF (congestive heart failure) -Stable and currently compensated continue current medical management    Dementia arising in the senium and presenium -Stable continue current medical management    DVT (deep venous thrombosis) -Please see discussion above.    Diabetes mellitus type 2, controlled - Carb modified diet - Continue SSI  Code Status: DNR Family Communication: Discussed directly with patient Disposition Plan: Pending improvement in condition   Consultants:  None  Procedures:  None  Antibiotics:  Vancomycin  HPI/Subjective: Patient has no new complaints, no acute issues. Discuss case with care manager who reports the patient was switched from on anticoagulation agent to another while at skilled nursing facility due to problems affording medication  Objective: Filed Vitals:   12/16/14 0500  BP: 133/86  Pulse: 78  Temp: 98.6 F (37 C)  Resp: 20    Intake/Output Summary (Last 24 hours) at 12/16/14 1417 Last data filed at 12/15/14 1901  Gross per 24 hour  Intake    240 ml  Output      0 ml  Net    240 ml   Filed Weights   12/14/14 1455 12/14/14 1939  Weight: 81.647 kg (180 lb) 78.245 kg (172 lb 8 oz)    Exam:   General:  Patient in no acute distress, alert and awake  Cardiovascular: S1 and S2 within normal limits, no  murmurs  Respiratory: Clear to auscultation bilaterally, no wheezes  Abdomen: Soft, nontender, nondistended  Musculoskeletal: Left lower extremity cellulitis with mild pain on palpation   Data Reviewed: Basic Metabolic Panel:  Recent Labs Lab 12/14/14 1540 12/15/14 0515  NA 142 143  K 3.4* 3.9  CL 105 107  CO2 28 28  GLUCOSE 113* 92  BUN 10 14  CREATININE 0.86 0.91  CALCIUM 9.0 8.9   Liver Function Tests: No results for input(s): AST, ALT, ALKPHOS, BILITOT, PROT, ALBUMIN in the last 168 hours. No results for input(s): LIPASE, AMYLASE in the last 168 hours. No results for input(s): AMMONIA in the last 168 hours. CBC:  Recent Labs Lab 12/14/14 1540 12/15/14 0515  WBC 11.5* 9.3  NEUTROABS 7.0  --   HGB 9.4* 8.1*  HCT 30.1* 26.1*  MCV 109.9* 109.2*  PLT 291 244   Cardiac Enzymes: No results for input(s): CKTOTAL, CKMB, CKMBINDEX, TROPONINI in the last 168 hours. BNP (last 3 results)  Recent Labs  11/29/14 1805  BNP 31.8    ProBNP (last 3 results)  Recent Labs  03/22/14 1054 08/15/14 1735  PROBNP 183.0* 171.4    CBG:  Recent Labs Lab 12/15/14 1128 12/15/14 1648 12/15/14 2203 12/16/14 0753 12/16/14 1211  GLUCAP 162* 120* 114* 112* 105*    Recent Results (from the past 240 hour(s))  Culture, blood (routine x 2)     Status: None (Preliminary result)   Collection Time: 12/14/14  3:40 PM  Result Value Ref Range Status   Specimen Description BLOOD RIGHT ANTECUBITAL  Final   Special  Requests BOTTLES DRAWN AEROBIC AND ANAEROBIC [facility] EACH  Final   Culture   Final           BLOOD CULTURE RECEIVED NO GROWTH TO DATE CULTURE WILL BE HELD FOR 5 DAYS BEFORE ISSUING A FINAL NEGATIVE REPORT Performed at [facility]    Report Status PENDING  Incomplete  Culture, blood (routine x 2)     Status: None (Preliminary result)   Collection Time: 12/14/14  3:43 PM  Result Value Ref Range Status   Specimen Description BLOOD RIGHT HAND  Final   Special  Requests BOTTLES DRAWN AEROBIC ONLY 2CC  Final   Culture   Final           BLOOD CULTURE RECEIVED NO GROWTH TO DATE CULTURE WILL BE HELD FOR 5 DAYS BEFORE ISSUING A FINAL NEGATIVE REPORT Performed at [facility]    Report Status PENDING  Incomplete  MRSA PCR Screening     Status: None   Collection Time: 12/14/14  7:39 PM  Result Value Ref Range Status   MRSA by PCR NEGATIVE NEGATIVE Final    Comment:        The GeneXpert MRSA Assay (FDA approved for NASAL specimens only), is one component of a comprehensive MRSA colonization surveillance program. It is not intended to diagnose MRSA infection nor to guide or monitor treatment for MRSA infections.      Studies: No results found.  Scheduled Meds: . antiseptic oral rinse  7 mL Mouth Rinse q12n4p  . carvedilol  6.25 mg Oral BID WC  . chlorhexidine  15 mL Mouth Rinse BID  . divalproex  250 mg Oral TID  . docusate sodium  100 mg Oral BID  . doxycycline  100 mg Oral Q12H  . enoxaparin (LOVENOX) injection  80 mg Subcutaneous Q12H  . furosemide  40 mg Oral Daily  . insulin aspart  0-9 Units Subcutaneous TID WC  . ipratropium-albuterol  3 mL Nebulization TID  . levothyroxine  175 mcg Oral QAC breakfast  . losartan  50 mg Oral Daily  . OLANZapine  10 mg Oral QHS  . pantoprazole  40 mg Oral QODAY  . potassium chloride SA  20 mEq Oral Daily  . rivastigmine  4.6 mg Transdermal q morning - 10a  . sodium chloride  3 mL Intravenous Q12H  . traZODone  100 mg Oral QHS  . warfarin  4 mg Oral ONCE-1800  . Warfarin - Pharmacist Dosing Inpatient   Does not apply q1800   Continuous Infusions:    Time spent: > 35 minutes    Penny Pia  [facility] Pager [phone]. If 7PM-7AM, please contact night-coverage at [email or web address], password [facility] 12/16/2014, 2:17 PM  LOS: 2 days

## 2014-12-16 NOTE — Progress Notes (Signed)
ANTICOAGULATION CONSULT NOTE - Initial Consult  Pharmacy Consult for Lovenox and warfarin Indication: LLE DVT with extension on dabigatran  Allergies  Allergen Reactions  . Bactrim [Sulfamethoxazole-Trimethoprim] Anaphylaxis  . Contrast Media [Iodinated Diagnostic Agents] Anaphylaxis and Swelling  . Iodine Anaphylaxis  . Iohexol Anaphylaxis, Shortness Of Breath and Swelling     Desc: PT STATES SHE HAD A SEVERE REACTION TO IV CONRAST WITH THROAT SWELLING AND SOB. SHE WAS ADMITTED TO THE HOSPITAL. SHE HAS NEVER HAD CONTRAST AGAIN.   Marland Kitchen. Sulfa Antibiotics Anaphylaxis  . Aricept [Donepezil Hcl] Nausea And Vomiting  . Cymbalta [Duloxetine Hcl] Other (See Comments)    HALLUCINATIONS    . Delsym [Dextromethorphan] Other (See Comments)    Hallucination  . Guaifenesin & Derivatives Other (See Comments)    Hallucination  . Hydromet [Hydrocodone-Homatropine] Other (See Comments)    hallucinations  . Sodium Benzoate [Nutritional Supplements]     Per mar  . Trimethoprim     From mar  . Avelox [Moxifloxacin Hcl In Nacl] Rash  . Ciprofloxacin Itching  . Gadolinium Derivatives Other (See Comments)    Unknown    . Penicillins Rash    Rash as a child  . Quinolones Rash    Patient Measurements: Height: 5\' 5"  (165.1 cm) Weight: 172 lb 8 oz (78.245 kg) IBW/kg (Calculated) : 57  Vital Signs: Temp: 98.6 F (37 C) (02/05 0500) Temp Source: Oral (02/05 0500) BP: 133/86 mmHg (02/05 0500) Pulse Rate: 78 (02/05 0500)  Labs:  Recent Labs  12/14/14 1540 12/15/14 0515  HGB 9.4* 8.1*  HCT 30.1* 26.1*  PLT 291 244  LABPROT 16.8*  --   INR 1.34  --   CREATININE 0.86 0.91    Estimated Creatinine Clearance: 46.7 mL/min (by C-G formula based on Cr of 0.91).   Medical History: Past Medical History  Diagnosis Date  . Depression   . Ejection fraction     EF 40%, echo, November, 2012  / in improved, EF 60%, echo, December, 2012  . Contrast media allergy     Patient feels poorly with  contrast  . Status post AAA (abdominal aortic aneurysm) repair     Surgical repair, Dr. Hart RochesterLawson, December 27, 2011  . Pre-syncope     vasovagal w/ heart block  . Parotid mass     has refused further eval 10/2011  . GERD (gastroesophageal reflux disease)   . Anxiety   . Facial tic     L sided spasms - Botox trial summer 2013, ?effective  . ARMD (age related macular degeneration) 06/2014    bilateral vision loss - legally blind  . AAA (abdominal aortic aneurysm)     s/p repair 12/2011  . Congestive heart failure     NL EF 10/2011 echo   . Diabetes mellitus type II, controlled   . Hypothyroidism   . Sinus bradycardia 09/19/2011    Occurring simultaneously with complete heart block   . Hypertension   . COPD (chronic obstructive pulmonary disease)   . 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 Dementia     MRI with progressive chronic microvascular ischemia and generalized cerebral atrophy  . Lumbosacral spondylosis without myelopathy   . Legally blind 06/2014    due to ARMD    Medications:  Scheduled:  . antiseptic oral rinse  7 mL Mouth Rinse q12n4p  . carvedilol  6.25 mg Oral BID WC  . chlorhexidine  15 mL Mouth Rinse BID  .  divalproex  250 mg Oral TID  . docusate sodium  100 mg Oral BID  . doxycycline  100 mg Oral Q12H  . furosemide  40 mg Oral Daily  . insulin aspart  0-9 Units Subcutaneous TID WC  . ipratropium-albuterol  3 mL Nebulization TID  . levothyroxine  175 mcg Oral QAC breakfast  . losartan  50 mg Oral Daily  . OLANZapine  10 mg Oral QHS  . pantoprazole  40 mg Oral QODAY  . potassium chloride SA  20 mEq Oral Daily  . rivastigmine  4.6 mg Transdermal q morning - 10a  . sodium chloride  3 mL Intravenous Q12H  . traZODone  100 mg Oral QHS   Infusions:   PRN: sodium chloride, acetaminophen **OR** acetaminophen, albuterol, clonazePAM, haloperidol, ondansetron **OR** ondansetron (ZOFRAN) IV, sodium chloride  Assessment: 81 y/o F with  dementia was discharged 12/01/14 on apixaban following hospitalization for acute DVT in LLE, was reportedly changed to dabigatran due to insurance coverage.  Readmitted 12/14/14 through ED with worsening swelling and erythema of LLE.   Venous doppler 12/15/14 again demonstrates acute proximal-vein thrombosis in LLE. Hospitalist consulted with heme/onc MD and issued orders this afternoon to switch from dabigatran to Lovenox bridge and begin conversion to warfarin with pharmacy dosing assistance.   Last dose of dabigatran ( ) was administered at 1023 this morning.  Significant drug-drug interactions with warfarin: Doxycycline - can increase INR on warfarin and reduce warfarin dosage requirements  SCr WNL.  Estimated CrCl > 30 mL/min threshold for use of q12h Lovenox dosing. Hgb low at baseline. Pltc WNL.   Goal of Therapy:  INR 2.0 - 3.0 Full dose bridging with Lovenox while awaiting therapeutic INR Monitor platelets by anticoagulation protocol: Yes   Plan:  1. DC dabigatran (done by MD) 2. Begin Lovenox 80 mg ( /kg) SQ q12h, first dose at 6pm 3. Warfarin 4 mg PO x 1 at 1800 4. Daily PT/INR.   CBC at least q3days 5. Continue Lovenox overlap with warfarin for at least 5 days or until INR 2.0 or above on two consecutive days, whichever is longer. 6. Patient not a candidate for warfarin teaching given dementia but will offer information to family when they are available.  Elie Goody, PharmD, BCPS Pager: [phone] 12/16/2014  1:04 PM

## 2014-12-16 NOTE — Progress Notes (Signed)
Clinical Social Work  Patient worked with PT who recommends SNF. CSW spoke with husband who [facility] Husband not in room but CSW spoke with husband via phone. [facility] and is worried that he might be admitted. Husband reports he is worried about patient but does not want her to worry about him. Husband requested that staff remind patient he will come visit but not to share that he is in the hospital because she will worry about him and become anxious. CSW shared information with RN.  [facility] which are on chart in case patient is medically stable to DC this weekend. Weekend CSW can contact Clydie BraunKaren at [phone] if patient is medically stable over the weekend. Patient does not have a current PASRR in system. When CSW called [facility] they reported that wrong social security number was entered for patient. CSW spoke with Alvino ChapelEllen at [facility] who reports patient was previously at their facility and they have PASRR number for patient and can accept her. If family changes their mind re: different SNF facility then PASRR would need to be verified with new facility.  CSW will continue to follow.  [place]vue[place]olly Tesslyn Baumert, KentuckyLCSW [phone]

## 2014-12-16 NOTE — Evaluation (Signed)
Physical Therapy Evaluation Patient Details Name: Paula LarsenSusan R Sigmon MRN: [id number] DOB: [birth year] Today's Date: 12/16/2014   History of Present Illness  Pt admitted with L LE cellulitis and DVT. Pt was recently d/c with L DVT and has history of dementia.  Clinical Impression  Pt admitted with above diagnosis. Pt currently with functional limitations due to the deficits listed below (see PT Problem List). Pt will benefit from skilled PT to increase their independence and safety with mobility to allow discharge to the venue listed below.  Recommend returning to SNF.     Follow Up Recommendations SNF    Equipment Recommendations  None recommended by PT    Recommendations for Other Services       Precautions / Restrictions Precautions Precautions: Fall Restrictions Weight Bearing Restrictions: No      Mobility  Bed Mobility Overal bed mobility: Needs Assistance Bed Mobility: Supine to Sit     Supine to sit: Min assist;HOB elevated     General bed mobility comments: Pulling on PT's hand for leverage and use od bed pad to assist with getting hips turned.  Transfers Overall transfer level: Needs assistance Equipment used: Rolling walker (2 wheeled) Transfers: Stand Pivot Transfers;Sit to/from Stand Sit to Stand: Max assist;+2 safety/equipment Stand pivot transfers: Mod assist;+2 safety/equipment       General transfer comment: Pt with posterior lean upon initial standing and not putting much weight through RW.  Nursing student present during transfer for +2 for safety but not physically assisting. Did recommend +2 of nursing staff for transfers though.  Ambulation/Gait                Stairs            Wheelchair Mobility    Modified Rankin (Stroke Patients Only)       Balance Overall balance assessment: Needs assistance Sitting-balance support: Bilateral upper extremity supported;Feet supported Sitting balance-Leahy Scale: Poor Sitting balance -  Comments: Fair to poor balance.  Would lean back and feet would come up off the floor decreasing balance. Postural control: Posterior lean   Standing balance-Leahy Scale: Poor Standing balance comment: posterior lean and multi-modal cueing to get COG over BOS                             Pertinent Vitals/Pain Pain Assessment: No/denies pain    Home Living Family/patient expects to be discharged to:: Skilled nursing facility                      Prior Function Level of Independence: Needs assistance   Gait / Transfers Assistance Needed: "I use a w/c, then they get me walking and move me and put me back in a wheelchair."           Hand Dominance   Dominant Hand: Right    Extremity/Trunk Assessment   Upper Extremity Assessment: Overall WFL for tasks assessed;Generalized weakness           Lower Extremity Assessment: Generalized weakness;Overall WFL for tasks assessed      Cervical / Trunk Assessment: Normal  Communication   Communication: No difficulties  Cognition Arousal/Alertness: Awake/alert Behavior During Therapy: WFL for tasks assessed/performed Overall Cognitive Status: History of cognitive impairments - at baseline       Memory: Decreased short-term memory              General Comments General comments (skin integrity, edema, etc.): L LE  redness    Exercises        Assessment/Plan    PT Assessment Patient needs continued PT services  PT Diagnosis Generalized weakness   PT Problem List Decreased strength;Decreased balance;Decreased mobility;Decreased activity tolerance;Decreased safety awareness  PT Treatment Interventions Gait training;Functional mobility training;Therapeutic activities;Therapeutic exercise;DME instruction;Balance training   PT Goals (Current goals can be found in the Care Plan section) Acute Rehab PT Goals Patient Stated Goal: None stated. PT Goal Formulation: Patient unable to participate in goal  setting Time For Goal Achievement: 12/23/14 Potential to Achieve Goals: Good    Frequency Min 3X/week   Barriers to discharge        Co-evaluation               End of Session Equipment Utilized During Treatment: Gait belt Activity Tolerance: Patient limited by fatigue Patient left: in chair;with call bell/phone within reach Nurse Communication: Mobility status;Other (comment) (recommend +2 of nursing to return to bed )         Time: 1610-9604 PT Time Calculation (min) (ACUTE ONLY): 19 min   Charges:   PT Evaluation $Initial PT Evaluation Tier I: 1 Procedure     PT G Codes:        Roshini Fulwider LUBECK 12/16/2014, 10:21 AM

## 2014-12-17 LAB — GLUCOSE, CAPILLARY
GLUCOSE-CAPILLARY: 101 mg/dL — AB (ref 70–99)
GLUCOSE-CAPILLARY: 173 mg/dL — AB (ref 70–99)
Glucose-Capillary: 97 mg/dL (ref 70–99)
Glucose-Capillary: 108 mg/dL — ABNORMAL HIGH (ref 70–99)

## 2014-12-17 LAB — PROTIME-INR
INR: 1.55 — AB (ref 0.00–1.49)
PROTHROMBIN TIME: 18.7 s — AB (ref 11.6–15.2)

## 2014-12-17 MED ORDER — WARFARIN SODIUM 4 MG PO TABS
4.0000 mg | ORAL_TABLET | Freq: Once | ORAL | Status: AC
  Administered 2014-12-17: 4 mg via ORAL
  Filled 2014-12-17: qty 1

## 2014-12-17 NOTE — Progress Notes (Signed)
ANTICOAGULATION CONSULT NOTE   Pharmacy Consult for Lovenox and warfarin Indication: LLE DVT with extension on dabigatran  Allergies  Allergen Reactions  . Bactrim [Sulfamethoxazole-Trimethoprim] Anaphylaxis  . Contrast Media [Iodinated Diagnostic Agents] Anaphylaxis and Swelling  . Iodine Anaphylaxis  . Iohexol Anaphylaxis, Shortness Of Breath and Swelling     Desc: PT STATES SHE HAD A SEVERE REACTION TO IV CONRAST WITH THROAT SWELLING AND SOB. SHE WAS ADMITTED TO THE HOSPITAL. SHE HAS NEVER HAD CONTRAST AGAIN.   Marland Kitchen. Sulfa Antibiotics Anaphylaxis  . Aricept [Donepezil Hcl] Nausea And Vomiting  . Cymbalta [Duloxetine Hcl] Other (See Comments)    HALLUCINATIONS    . Delsym [Dextromethorphan] Other (See Comments)    Hallucination  . Guaifenesin & Derivatives Other (See Comments)    Hallucination  . Hydromet [Hydrocodone-Homatropine] Other (See Comments)    hallucinations  . Sodium Benzoate [Nutritional Supplements]     Per mar  . Trimethoprim     From mar  . Avelox [Moxifloxacin Hcl In Nacl] Rash  . Ciprofloxacin Itching  . Gadolinium Derivatives Other (See Comments)    Unknown    . Penicillins Rash    Rash as a child  . Quinolones Rash    Patient Measurements: Height: 5\' 5"  (165.1 cm) Weight: 172 lb 8 oz (78.245 kg) IBW/kg (Calculated) : 57  Vital Signs: Temp: 98.6 F (37 C) (02/06 0539) Temp Source: Oral (02/06 0539) BP: 137/66 mmHg (02/06 0539) Pulse Rate: 76 (02/06 0539)  Labs:  Recent Labs  12/14/14 1540 12/15/14 0515 12/17/14 0537  HGB 9.4* 8.1*  --   HCT 30.1* 26.1*  --   PLT 291 244  --   LABPROT 16.8*  --  18.7*  INR 1.34  --  1.55*  CREATININE 0.86 0.91  --     Estimated Creatinine Clearance: 46.7 mL/min (by C-G formula based on Cr of 0.91).    Assessment: 81 y/o F with dementia was discharged 12/01/14 on apixaban following hospitalization for acute DVT in LLE, was reportedly changed to dabigatran due to insurance coverage.  Readmitted 12/14/14  through ED with worsening swelling and erythema of LLE.   Venous doppler 12/15/14 again demonstrates acute proximal-vein thrombosis in LLE. Hospitalist consulted with heme/onc MD and issued orders this afternoon to switch from dabigatran (last dose 1023 on 2/5) to Lovenox bridge and begin conversion to warfarin with pharmacy dosing assistance.  Today, 12/17/2014 :   INR 1.55, subtherapeutic as expected after only one dose.  CBC: Hgb low at baseline. Pltc WNL.  Renal: SCr WNL.  Estimated CrCl > 30 mL/min threshold for use of q12h Lovenox dosing.  Diet: carb modified  Significant drug-drug interactions with warfarin: Doxycycline (can increase INR on warfarin and reduce warfarin dosage requirements)    Goal of Therapy:  INR 2.0 - 3.0 Full dose bridging with Lovenox while awaiting therapeutic INR Monitor platelets by anticoagulation protocol: Yes   Plan:   Continue Lovenox 80 mg (1mg /kg) SQ q12h  Warfarin 4 mg PO x 1 at 1800  Daily PT/INR.   CBC at least q3days  Continue Lovenox overlap with warfarin for at least 5 days or until INR 2.0 or above on two consecutive days, whichever is longer.  Patient not a candidate for warfarin teaching given dementia but will offer information to family when they are available.   Lynann Beaverhristine Selah Klang PharmD, BCPS Pager [phone] 12/17/2014 9:32 AM

## 2014-12-17 NOTE — Progress Notes (Signed)
[facility] PROGRESS NOTE  Paula [name] ZOX:[id number] DOB: 09-08-1927 DOA: 12/14/2014 PCP: [name], MD  Assessment/Plan: Principal Problem:   Cellulitis of left lower extremity / Post-phlebitic syndrome - Most likely postphlebitic syndrome as DVT is worse at left lower extremity based on imaging findings. Patient was transitioned from eliquis to pradaxa while at facility. I have discussed case with hematologist and currently recommendations are for Coumadin with Lovenox bridging. INR at 1.5 on last check. - Continue doxycycline, patient afebrile.   Active Problems:   Hypothyroidism -Stable continue patient's Synthroid    Chronic diastolic CHF (congestive heart failure) -Stable and currently compensated continue current medical management    Dementia arising in the senium and presenium -Stable continue current medical management    DVT (deep venous thrombosis) -Please see discussion above.    Diabetes mellitus type 2, controlled - Carb modified diet - Continue SSI  Code Status: DNR Family Communication: Discussed directly with patient Disposition Plan: Pending improvement in condition   Consultants:  None  Procedures:  None  Antibiotics:  Vancomycin  HPI/Subjective: No acute issues reported overnight. No new complaints.  Objective: Filed Vitals:   12/17/14 0539  BP: 137/66  Pulse: 76  Temp: 98.6 F (37 C)  Resp:     Intake/Output Summary (Last 24 hours) at 12/17/14 1508 Last data filed at 12/17/14 1300  Gross per 24 hour  Intake    480 ml  Output    553 ml  Net    -73 ml   Filed Weights   12/14/14 1455 12/14/14 1939  Weight: 81.647 kg (180 lb) 78.245 kg (172 lb 8 oz)    Exam:   General:  Patient in no acute distress, alert and awake  Cardiovascular: S1 and S2 within normal limits, no murmurs  Respiratory: Clear to auscultation bilaterally, no wheezes  Abdomen: Soft, nontender, nondistended  Musculoskeletal: Left lower  extremity erythema with mild pain on palpation   Data Reviewed: Basic Metabolic Panel:  Recent Labs Lab 12/14/14 1540 12/15/14 0515  NA 142 143  K 3.4* 3.9  CL 105 107  CO2 28 28  GLUCOSE 113* 92  BUN 10 14  CREATININE 0.86 0.91  CALCIUM 9.0 8.9   Liver Function Tests: No results for input(s): AST, ALT, ALKPHOS, BILITOT, PROT, ALBUMIN in the last 168 hours. No results for input(s): LIPASE, AMYLASE in the last 168 hours. No results for input(s): AMMONIA in the last 168 hours. CBC:  Recent Labs Lab 12/14/14 1540 12/15/14 0515  WBC 11.5* 9.3  NEUTROABS 7.0  --   HGB 9.4* 8.1*  HCT 30.1* 26.1*  MCV 109.9* 109.2*  PLT 291 244   Cardiac Enzymes: No results for input(s): CKTOTAL, CKMB, CKMBINDEX, TROPONINI in the last 168 hours. BNP (last 3 results)  Recent Labs  11/29/14 1805  BNP 31.8    ProBNP (last 3 results)  Recent Labs  03/22/14 1054 08/15/14 1735  PROBNP 183.0* 171.4    CBG:  Recent Labs Lab 12/16/14 0753 12/16/14 1211 12/16/14 1632 12/17/14 0746 12/17/14 1132  GLUCAP 112* 105* 115* 97 108*    Recent Results (from the past 240 hour(s))  Culture, blood (routine x 2)     Status: None (Preliminary result)   Collection Time: 12/14/14  3:40 PM  Result Value Ref Range Status   Specimen Description BLOOD RIGHT ANTECUBITAL  Final   Special Requests BOTTLES DRAWN AEROBIC AND ANAEROBIC [facility]  Final   Culture   Final  BLOOD CULTURE RECEIVED NO GROWTH TO DATE CULTURE WILL BE HELD FOR 5 DAYS BEFORE ISSUING A FINAL NEGATIVE REPORT [facility]formed [facility]    Report Status PENDING  Incomplete  Culture, blood (routine x 2)     Status: None (Preliminary result)   Collection Time: 12/14/14  3:43 PM  Result Value Ref Range Status   Specimen Description BLOOD RIGHT HAND  Final   Special Requests BOTTLES DRAWN AEROBIC ONLY 2CC  Final   Culture   Final           BLOOD CULTURE RECEIVED NO GROWTH TO DATE CULTURE WILL BE HELD FOR 5 DAYS  BEFORE ISSUING A FINAL NEGATIVE REPORT [facility]formed at [facility]    Report Status PENDING  Incomplete  MRSA PCR Screening     Status: None   Collection Time: 12/14/14  7:39 PM  Result Value Ref Range Status   MRSA by PCR NEGATIVE NEGATIVE Final    Comment:        The GeneXpert MRSA Assay (FDA approved for NASAL specimens only), is one component of a comprehensive MRSA colonization surveillance program. It is not intended to diagnose MRSA infection nor to guide or monitor treatment for MRSA infections.      Studies: No results found.  Scheduled Meds: . antiseptic oral rinse  7 mL Mouth Rinse q12n4p  . carvedilol  6.25 mg Oral BID WC  . chlorhexidine  15 mL Mouth Rinse BID  . divalproex  250 mg Oral TID  . docusate sodium  100 mg Oral BID  . doxycycline  100 mg Oral Q12H  . enoxaparin (LOVENOX) injection  80 mg Subcutaneous Q12H  . furosemide  40 mg Oral Daily  . insulin aspart  0-9 Units Subcutaneous TID WC  . ipratropium-albuterol  3 mL Nebulization TID  . levothyroxine  175 mcg Oral QAC breakfast  . losartan  50 mg Oral Daily  . OLANZapine  10 mg Oral QHS  . pantoprazole  40 mg Oral QODAY  . potassium chloride SA  20 mEq Oral Daily  . rivastigmine  4.6 mg Transdermal q morning - 10a  . sodium chloride  3 mL Intravenous Q12H  . traZODone  100 mg Oral QHS  . warfarin  4 mg Oral ONCE-1800  . Warfarin - Pharmacist Dosing Inpatient   Does not apply q1800   Continuous Infusions:    Time spent: > 35 minutes    Penny PiaVEGA, Paula [name]  [facility] Pager [phone]. If 7PM-7AM, please contact night-coverage at [email or web address], password [email or web address] 12/17/2014, 3:08 PM  LOS: 3 days

## 2014-12-18 LAB — PROTIME-INR
INR: 1.33 (ref 0.00–1.49)
PROTHROMBIN TIME: 16.6 s — AB (ref 11.6–15.2)

## 2014-12-18 LAB — CBC
HEMATOCRIT: 28.5 % — AB (ref 36.0–46.0)
Hemoglobin: 8.9 g/dL — ABNORMAL LOW (ref 12.0–15.0)
MCH: 34.6 pg — ABNORMAL HIGH (ref 26.0–34.0)
MCHC: 31.2 g/dL (ref 30.0–36.0)
MCV: 110.9 fL — AB (ref 78.0–100.0)
Platelets: 292 10*3/uL (ref 150–400)
RBC: 2.57 MIL/uL — ABNORMAL LOW (ref 3.87–5.11)
RDW: 17.5 % — ABNORMAL HIGH (ref 11.5–15.5)
WBC: 8.2 10*3/uL (ref 4.0–10.5)

## 2014-12-18 LAB — GLUCOSE, CAPILLARY
Glucose-Capillary: 90 mg/dL (ref 70–99)
Glucose-Capillary: 121 mg/dL — ABNORMAL HIGH (ref 70–99)
Glucose-Capillary: 112 mg/dL — ABNORMAL HIGH (ref 70–99)
Glucose-Capillary: 120 mg/dL — ABNORMAL HIGH (ref 70–99)

## 2014-12-18 MED ORDER — WARFARIN SODIUM 6 MG PO TABS
6.0000 mg | ORAL_TABLET | Freq: Once | ORAL | Status: AC
  Administered 2014-12-18: 6 mg via ORAL
  Filled 2014-12-18: qty 1

## 2014-12-18 NOTE — Discharge Instructions (Signed)

## 2014-12-18 NOTE — Progress Notes (Signed)
ANTICOAGULATION CONSULT NOTE   Pharmacy Consult for Lovenox and warfarin Indication: LLE DVT with extension on dabigatran   Patient Measurements: Height: 5\' 5"  (165.1 cm) Weight: 172 lb 8 oz (78.245 kg) IBW/kg (Calculated) : 57  Vital Signs: Temp: 97.5 F (36.4 C) (02/07 0609) Temp Source: Axillary (02/07 0609) BP: 112/46 mmHg (02/07 0609) Pulse Rate: 60 (02/07 0609)  Labs:  Recent Labs  12/17/14 0537 12/18/14 0632  LABPROT 18.7* 16.6*  INR 1.55* 1.33    Estimated Creatinine Clearance: 46.7 mL/min (by C-G formula based on Cr of 0.91).   Assessment: 81 y/o F with dementia was discharged 12/01/14 on apixaban following hospitalization for acute DVT in LLE, was reportedly changed to dabigatran due to insurance coverage.  Readmitted 12/14/14 through ED with worsening swelling and erythema of LLE.   Venous doppler 12/15/14 again demonstrates acute proximal-vein thrombosis in LLE. Hospitalist consulted with heme/onc MD and issued orders this afternoon to switch from dabigatran (last dose 1023 on 2/5) to Lovenox bridge and begin conversion to warfarin with pharmacy dosing assistance.  Today, 12/18/2014 :   INR 1.33, subtherapeutic but decreased despite warfarin initiation.  CBC: Hgb low at baseline, but slightly improved at 8.9. Pltc WNL.  Renal: SCr WNL.  Estimated CrCl > 30 mL/min threshold for use of q12h Lovenox dosing.  Diet: carb modified  Significant drug-drug interactions with warfarin: Doxycycline (can increase INR on warfarin and reduce warfarin dosage requirements)    Goal of Therapy:  INR 2.0 - 3.0 Full dose bridging with Lovenox while awaiting therapeutic INR Monitor platelets by anticoagulation protocol: Yes   Plan:   Continue Lovenox 80 mg (1mg /kg) SQ q12h  Warfarin 6 mg PO x 1 at 1800  Daily PT/INR, CBC  Continue Lovenox overlap with warfarin for at least 5 days or until INR 2.0 or above on two consecutive days, whichever is longer.  Patient not a  candidate for warfarin teaching given dementia but will offer information to family when they are available.   Lynann Beaverhristine Rogan Wigley PharmD, BCPS Pager [phone] 12/18/2014 8:28 AM

## 2014-12-18 NOTE — Progress Notes (Signed)
[facility] PROGRESS NOTE  Paula LarsenSusan R [name] ZOX:[id number] DOB: 09/03/1927 DOA: 12/14/2014 PCP: Eustaquio BoydenJavier Gutierrez, MD  Assessment/Plan: Principal Problem:   Cellulitis of left lower extremity / Post-phlebitic syndrome - Most likely postphlebitic syndrome as DVT is worse at left lower extremity based on imaging findings. Patient was transitioned from eliquis to pradaxa while at facility. I have discussed case with hematologist and currently recommendations are for Coumadin with Lovenox bridging. INR at 1.33 on last check. Pharmacy assisting. - Continue doxycycline, patient afebrile.   Active Problems:   Hypothyroidism - Stable continue patient's Synthroid    Chronic diastolic CHF (congestive heart failure) - Stable and currently compensated continue current medical management    Dementia arising in the senium and presenium - Stable continue current medical management    DVT (deep venous thrombosis) - Please see discussion above. - worsened on pradaxa    Diabetes mellitus type 2, controlled - Carb modified diet - Continue SSI  Code Status: DNR Family Communication: Discussed directly with patient Disposition Plan: Pending improvement in condition   Consultants:  None  Procedures:  None  Antibiotics:  Vancomycin  HPI/Subjective: No acute issues reported overnight. No new complaints.  Objective: Filed Vitals:   12/18/14 1410  BP: 103/61  Pulse: 79  Temp: 98.2 F (36.8 C)  Resp: 18    Intake/Output Summary (Last 24 hours) at 12/18/14 1430 Last data filed at 12/18/14 1229  Gross per 24 hour  Intake    720 ml  Output    351 ml  Net    369 ml   Filed Weights   12/14/14 1455 12/14/14 1939  Weight: 81.647 kg (180 lb) 78.245 kg (172 lb 8 oz)    Exam:   General:  Patient in no acute distress, alert and awake  Cardiovascular: S1 and S2 within normal limits, no murmurs  Respiratory: Clear to auscultation bilaterally, no wheezes  Abdomen: Soft,  nontender, nondistended  Musculoskeletal: Left lower extremity erythema with mild pain on palpation   Data Reviewed: Basic Metabolic Panel:  Recent Labs Lab 12/14/14 1540 12/15/14 0515  NA 142 143  K 3.4* 3.9  CL 105 107  CO2 28 28  GLUCOSE 113* 92  BUN 10 14  CREATININE 0.86 0.91  CALCIUM 9.0 8.9   Liver Function Tests: No results for input(s): AST, ALT, ALKPHOS, BILITOT, PROT, ALBUMIN in the last 168 hours. No results for input(s): LIPASE, AMYLASE in the last 168 hours. No results for input(s): AMMONIA in the last 168 hours. CBC:  Recent Labs Lab 12/14/14 1540 12/15/14 0515 12/18/14 0600  WBC 11.5* 9.3 8.2  NEUTROABS 7.0  --   --   HGB 9.4* 8.1* 8.9*  HCT 30.1* 26.1* 28.5*  MCV 109.9* 109.2* 110.9*  PLT 291 244 292   Cardiac Enzymes: No results for input(s): CKTOTAL, CKMB, CKMBINDEX, TROPONINI in the last 168 hours. BNP (last 3 results)  Recent Labs  11/29/14 1805  BNP 31.8    ProBNP (last 3 results)  Recent Labs  03/22/14 1054 08/15/14 1735  PROBNP 183.0* 171.4    CBG:  Recent Labs Lab 12/17/14 1132 12/17/14 1623 12/17/14 2107 12/18/14 0725 12/18/14 1203  GLUCAP 108* 101* 173* 90 121*    Recent Results (from the past 240 hour(s))  Culture, blood (routine x 2)     Status: None (Preliminary result)   Collection Time: 12/14/14  3:40 PM  Result Value Ref Range Status   Specimen Description BLOOD RIGHT ANTECUBITAL  Final   Special Requests BOTTLES  DRAWN AEROBIC AND ANAEROBIC [facility] EACH  Final   Culture   Final           BLOOD CULTURE RECEIVED NO GROWTH TO DATE CULTURE WILL BE HELD FOR 5 DAYS BEFORE ISSUING A FINAL NEGATIVE REPORT Performed at [facility]    Report Status PENDING  Incomplete  Culture, blood (routine x 2)     Status: None (Preliminary result)   Collection Time: 12/14/14  3:43 PM  Result Value Ref Range Status   Specimen Description BLOOD RIGHT HAND  Final   Special Requests BOTTLES DRAWN AEROBIC ONLY 2CC  Final    Culture   Final           BLOOD CULTURE RECEIVED NO GROWTH TO DATE CULTURE WILL BE HELD FOR 5 DAYS BEFORE ISSUING A FINAL NEGATIVE REPORT Performed at [facility]    Report Status PENDING  Incomplete  MRSA PCR Screening     Status: None   Collection Time: 12/14/14  7:39 PM  Result Value Ref Range Status   MRSA by PCR NEGATIVE NEGATIVE Final    Comment:        The GeneXpert MRSA Assay (FDA approved for NASAL specimens only), is one component of a comprehensive MRSA colonization surveillance program. It is not intended to diagnose MRSA infection nor to guide or monitor treatment for MRSA infections.      Studies: No results found.  Scheduled Meds: . antiseptic oral rinse  7 mL Mouth Rinse q12n4p  . carvedilol  6.25 mg Oral BID WC  . chlorhexidine  15 mL Mouth Rinse BID  . divalproex  250 mg Oral TID  . docusate sodium  100 mg Oral BID  . doxycycline  100 mg Oral Q12H  . enoxaparin (LOVENOX) injection  80 mg Subcutaneous Q12H  . furosemide  40 mg Oral Daily  . insulin aspart  0-9 Units Subcutaneous TID WC  . ipratropium-albuterol  3 mL Nebulization TID  . levothyroxine  175 mcg Oral QAC breakfast  . losartan  50 mg Oral Daily  . OLANZapine  10 mg Oral QHS  . pantoprazole  40 mg Oral QODAY  . potassium chloride SA  20 mEq Oral Daily  . rivastigmine  4.6 mg Transdermal q morning - 10a  . sodium chloride  3 mL Intravenous Q12H  . traZODone  100 mg Oral QHS  . warfarin  6 mg Oral ONCE-1800  . Warfarin - Pharmacist Dosing Inpatient   Does not apply q1800   Continuous Infusions:    Time spent: > 35 minutes    Penny Pia  [facility] Pager [phone]. If 7PM-7AM, please contact night-coverage at [email or web address], password [facility] 12/18/2014, 2:30 PM  LOS: 4 days

## 2014-12-19 LAB — PROTIME-INR
INR: 1.27 (ref 0.00–1.49)
Prothrombin Time: 16 seconds — ABNORMAL HIGH (ref 11.6–15.2)

## 2014-12-19 LAB — CBC
HCT: 27.3 % — ABNORMAL LOW (ref 36.0–46.0)
Hemoglobin: 8.7 g/dL — ABNORMAL LOW (ref 12.0–15.0)
MCH: 35.1 pg — ABNORMAL HIGH (ref 26.0–34.0)
MCHC: 31.9 g/dL (ref 30.0–36.0)
MCV: 110.1 fL — ABNORMAL HIGH (ref 78.0–100.0)
Platelets: 257 10*3/uL (ref 150–400)
RBC: 2.48 MIL/uL — AB (ref 3.87–5.11)
RDW: 17 % — ABNORMAL HIGH (ref 11.5–15.5)
WBC: 8.4 10*3/uL (ref 4.0–10.5)

## 2014-12-19 LAB — GLUCOSE, CAPILLARY: GLUCOSE-CAPILLARY: 99 mg/dL (ref 70–99)

## 2014-12-19 MED ORDER — WARFARIN SODIUM 6 MG PO TABS
6.0000 mg | ORAL_TABLET | Freq: Once | ORAL | Status: AC
  Administered 2014-12-19: 6 mg via ORAL
  Filled 2014-12-19: qty 1

## 2014-12-19 NOTE — Progress Notes (Addendum)
Clinical Social Work  CSW updated [employer] on DC plans and SNF can accept patient when medically stable. CSW will continue to follow.  Sindy Messing, [place] [phone]  [id number] CSW met with patient and husband who are agreeable to [facility] and reports MD stated patient would DC tomorrow. Husband thanked CSW for help and reports he will be happy when patient DC is feels less anxious. Husband reports that patient wants him to stay often and visit several times a day and it is has been difficult for him to manage his medical needs along with visiting patient. Husband reports he will call CSW with any further questions.

## 2014-12-19 NOTE — Progress Notes (Signed)
[facility] PROGRESS NOTE  AIRANNA PARTIN ZOX:[id number] DOB: [birth year] DOA: 12/14/2014 PCP: [name], MD  Assessment/Plan: Principal Problem:   Cellulitis of left lower extremity / Post-phlebitic syndrome - Most likely postphlebitic syndrome as DVT is worse at left lower extremity based on imaging findings. Patient was transitioned from eliquis to pradaxa while at facility. I have discussed case with hematologist and currently recommendations are for Coumadin with Lovenox bridging. INR at 1.27 on last check. Pharmacy assisting. - Continue doxycycline, patient afebrile.   Active Problems:   Hypothyroidism - Stable continue patient's Synthroid    Chronic diastolic CHF (congestive heart failure) - Stable and currently compensated continue current medical management    Dementia arising in the senium and presenium - Stable continue current medical management    DVT (deep venous thrombosis) - Please see discussion above. - worsened on pradaxa    Diabetes mellitus type 2, controlled - Carb modified diet - Continue SSI  Code Status: DNR Family Communication: Discussed directly with patient Disposition Plan: Most likely d/c next am.   Consultants:  None  Procedures:  None  Antibiotics:  doxycycline  HPI/Subjective: No acute issues reported overnight. No new complaints.  Objective: Filed Vitals:   12/19/14 1316  BP: 108/48  Pulse: 82  Temp: 98.5 F (36.9 C)  Resp: 20    Intake/Output Summary (Last 24 hours) at 12/19/14 1646 Last data filed at 12/19/14 0825  Gross per 24 hour  Intake    480 ml  Output      0 ml  Net    480 ml   Filed Weights   12/14/14 1455 12/14/14 1939  Weight: 81.647 kg (180 lb) 78.245 kg (172 lb 8 oz)    Exam:   General:  Patient in no acute distress, alert and awake  Cardiovascular: S1 and S2 within normal limits, no murmurs  Respiratory: Clear to auscultation bilaterally, no wheezes  Abdomen: Soft, nontender,  nondistended  Musculoskeletal: Left lower extremity erythema with mild pain on palpation   Data Reviewed: Basic Metabolic Panel:  Recent Labs Lab 12/14/14 1540 12/15/14 0515  NA 142 143  K 3.4* 3.9  CL 105 107  CO2 28 28  GLUCOSE 113* 92  BUN 10 14  CREATININE 0.86 0.91  CALCIUM 9.0 8.9   Liver Function Tests: No results for input(s): AST, ALT, ALKPHOS, BILITOT, PROT, ALBUMIN in the last 168 hours. No results for input(s): LIPASE, AMYLASE in the last 168 hours. No results for input(s): AMMONIA in the last 168 hours. CBC:  Recent Labs Lab 12/14/14 1540 12/15/14 0515 12/18/14 0600 12/19/14 0525  WBC 11.5* 9.3 8.2 8.4  NEUTROABS 7.0  --   --   --   HGB 9.4* 8.1* 8.9* 8.7*  HCT 30.1* 26.1* 28.5* 27.3*  MCV 109.9* 109.2* 110.9* 110.1*  PLT 291 244 292 257   Cardiac Enzymes: No results for input(s): CKTOTAL, CKMB, CKMBINDEX, TROPONINI in the last 168 hours. BNP (last 3 results)  Recent Labs  11/29/14 1805  BNP 31.8    ProBNP (last 3 results)  Recent Labs  03/22/14 1054 08/15/14 1735  PROBNP 183.0* 171.4    CBG:  Recent Labs Lab 12/18/14 0725 12/18/14 1203 12/18/14 1616 12/18/14 2221 12/19/14 0719  GLUCAP 90 121* 112* 120* 99    Recent Results (from the past 240 hour(s))  Culture, blood (routine x 2)     Status: None (Preliminary result)   Collection Time: 12/14/14  3:40 PM  Result Value Ref Range Status  Specimen Description BLOOD RIGHT ANTECUBITAL  Final   Special Requests BOTTLES DRAWN AEROBIC AND ANAEROBIC [facility] EACH  Final   Culture   Final           BLOOD CULTURE RECEIVED NO GROWTH TO DATE CULTURE WILL BE HELD FOR 5 DAYS BEFORE ISSUING A FINAL NEGATIVE REPORT Performed at [facility]    Report Status PENDING  Incomplete  Culture, blood (routine x 2)     Status: None (Preliminary result)   Collection Time: 12/14/14  3:43 PM  Result Value Ref Range Status   Specimen Description BLOOD RIGHT HAND  Final   Special Requests BOTTLES  DRAWN AEROBIC ONLY 2CC  Final   Culture   Final           BLOOD CULTURE RECEIVED NO GROWTH TO DATE CULTURE WILL BE HELD FOR 5 DAYS BEFORE ISSUING A FINAL NEGATIVE REPORT Performed at [facility]    Report Status PENDING  Incomplete  MRSA PCR Screening     Status: None   Collection Time: 12/14/14  7:39 PM  Result Value Ref Range Status   MRSA by PCR NEGATIVE NEGATIVE Final    Comment:        The GeneXpert MRSA Assay (FDA approved for NASAL specimens only), is one component of a comprehensive MRSA colonization surveillance program. It is not intended to diagnose MRSA infection nor to guide or monitor treatment for MRSA infections.      Studies: No results found.  Scheduled Meds: . antiseptic oral rinse  7 mL Mouth Rinse q12n4p  . carvedilol  6.25 mg Oral BID WC  . chlorhexidine  15 mL Mouth Rinse BID  . divalproex  250 mg Oral TID  . docusate sodium  100 mg Oral BID  . doxycycline  100 mg Oral Q12H  . enoxaparin (LOVENOX) injection  80 mg Subcutaneous Q12H  . furosemide  40 mg Oral Daily  . insulin aspart  0-9 Units Subcutaneous TID WC  . ipratropium-albuterol  3 mL Nebulization TID  . levothyroxine  175 mcg Oral QAC breakfast  . losartan  50 mg Oral Daily  . OLANZapine  10 mg Oral QHS  . pantoprazole  40 mg Oral QODAY  . potassium chloride SA  20 mEq Oral Daily  . rivastigmine  4.6 mg Transdermal q morning - 10a  . sodium chloride  3 mL Intravenous Q12H  . traZODone  100 mg Oral QHS  . warfarin  6 mg Oral ONCE-1800  . Warfarin - Pharmacist Dosing Inpatient   Does not apply q1800   Continuous Infusions:    Time spent: > 35 minutes    Penny PiaVEGA, Sawyer Mentzer  [facility] Pager [phone]. If 7PM-7AM, please contact night-coverage at [email or web address], password [email or web address] 12/19/2014, 4:46 PM  LOS: 5 days

## 2014-12-19 NOTE — Progress Notes (Signed)
[place] for Lovenox and warfarin Indication: LLE DVT with extension on dabigatran   Patient Measurements: Height: 5' 5"  (165.1 cm) Weight: 172 lb 8 oz (78.245 kg) IBW/kg (Calculated) : 57  Vital Signs: Temp: 98 F (36.7 C) (02/08 0522) Temp Source: Oral (02/08 0522) BP: 119/45 mmHg (02/08 0522) Pulse Rate: 88 (02/08 0522)  Labs:  Recent Labs  12/17/14 0537 12/18/14 0600 12/18/14 0632 12/19/14 0525  HGB  --  8.9*  --  8.7*  HCT  --  28.5*  --  27.3*  PLT  --  292  --  257  LABPROT 18.7*  --  16.6* 16.0*  INR 1.55*  --  1.33 1.27    Estimated Creatinine Clearance: 46.7 mL/min (by C-G formula based on Cr of 0.91).   Assessment: 81 y/o F with dementia was discharged 12/01/14 on apixaban following hospitalization for acute DVT in LLE, was reportedly changed to dabigatran due to insurance coverage.  Readmitted 12/14/14 through ED with worsening swelling and erythema of LLE.   Venous doppler 12/15/14 again demonstrates acute proximal-vein thrombosis in LLE. Hospitalist consulted with heme/onc MD and issued orders this afternoon to switch from dabigatran (last dose 1023 on 2/5) to Lovenox bridge and begin conversion to warfarin with pharmacy dosing assistance.  Today, 12/19/2014 :  INR was initially responding now falling after warfarin x 3 days (see MAR for dosages) Hgb low at baseline, slowly trending down. Pltc remains WNL. No overt bleeding reported. Renal: SCr WNL on 12/15/14.  Estimated CrCl > 30 mL/min threshold for use of q12h Lovenox dosing. Diet: carb modified, charted as eating 75-100% of portions Significant drug-drug interactions with warfarin: doxycycline (can increase INR on warfarin and reduce warfarin dosage requirements)    Goal of Therapy:  INR 2.0 - 3.0 Full-dose bridging with Lovenox while awaiting therapeutic INR Monitor platelets by anticoagulation protocol: Yes   Plan:   Continue Lovenox 80 mg (66m/kg) SQ  q12h  Warfarin 6 mg PO x 1 at 1800  Daily PT/INR, CBC  Continue Lovenox overlap with warfarin to complete at least 5 days or until INR 2.0 or above on two consecutive days, whichever is longer, then switch to warfarin monotherapy.  If patient is discharged to SNF before above criteria are met, she will need INR checked daily until Lovenox bridging complete, then at least 3x weekly until INR stable and therapeutic on warfarin x 1 week, then at least 2x weekly x 1 week, then at least weekly while in SNF.  Because of patient's dementia, warfarin teaching was provided to husband on 12/18/14.  Also had a discussion with patient this AM and her responses were appropriate but unclear whether she will retain any of the information. Teaching sheet has been provided for family and patient  RClayburn Pert PharmD, BCPS Pager: [phone]/06/2015  9:52 AM

## 2014-12-19 NOTE — Progress Notes (Signed)
PT Cancellation Note  Patient Details Name: Duard LarsenSusan R Lavallie MRN: [id number] DOB: 04-Oct-1927   Cancelled Treatment:    Reason Eval/Treat Not Completed: Patient declined, no reason specified (pt adamantly refused, husb attempted to encourage pt, she continued to refuse--"not today!" )   [facility] Center[facility]ILLIAMS,Davison Ohms 12/19/2014, 11:00 AM

## 2014-12-20 ENCOUNTER — Ambulatory Visit: Payer: Self-pay

## 2014-12-20 LAB — GLUCOSE, CAPILLARY
Glucose-Capillary: 98 mg/dL (ref 70–99)
Glucose-Capillary: 115 mg/dL — ABNORMAL HIGH (ref 70–99)
Glucose-Capillary: 124 mg/dL — ABNORMAL HIGH (ref 70–99)
Glucose-Capillary: 92 mg/dL (ref 70–99)
Glucose-Capillary: 107 mg/dL — ABNORMAL HIGH (ref 70–99)

## 2014-12-20 LAB — CREATININE, SERUM
CREATININE: 0.81 mg/dL (ref 0.50–1.10)
GFR calc non Af Amer: 64 mL/min — ABNORMAL LOW (ref >90)
GFR, EST AFRICAN AMERICAN: 75 mL/min — AB (ref >90)

## 2014-12-20 LAB — CULTURE, BLOOD (ROUTINE X 2)
CULTURE: NO GROWTH
Culture: NO GROWTH

## 2014-12-20 LAB — CBC
HEMATOCRIT: 28.1 % — AB (ref 36.0–46.0)
Hemoglobin: 8.9 g/dL — ABNORMAL LOW (ref 12.0–15.0)
MCH: 34.9 pg — AB (ref 26.0–34.0)
MCHC: 31.7 g/dL (ref 30.0–36.0)
MCV: 110.2 fL — AB (ref 78.0–100.0)
Platelets: 272 10*3/uL (ref 150–400)
RBC: 2.55 MIL/uL — ABNORMAL LOW (ref 3.87–5.11)
RDW: 17.4 % — AB (ref 11.5–15.5)
WBC: 7.3 10*3/uL (ref 4.0–10.5)

## 2014-12-20 LAB — PROTIME-INR
INR: 1.39 (ref 0.00–1.49)
PROTHROMBIN TIME: 17.2 s — AB (ref 11.6–15.2)

## 2014-12-20 MED ORDER — WARFARIN SODIUM 6 MG PO TABS: 6.0000 mg | ORAL_TABLET | Freq: Once | ORAL | Status: DC

## 2014-12-20 MED ORDER — CLONAZEPAM 0.5 MG PO TABS: 0.5000 mg | ORAL_TABLET | Freq: Two times a day (BID) | ORAL | Status: DC | PRN

## 2014-12-20 MED ORDER — ENOXAPARIN SODIUM 80 MG/0.8ML ~~LOC~~ SOLN: 80.0000 mg | Freq: Two times a day (BID) | SUBCUTANEOUS | Status: DC

## 2014-12-20 MED ORDER — WARFARIN SODIUM 6 MG PO TABS
6.0000 mg | ORAL_TABLET | Freq: Once | ORAL | Status: DC
  Filled 2014-12-20: qty 1

## 2014-12-20 NOTE — Progress Notes (Addendum)
[place] for Lovenox and warfarin Indication: LLE DVT with extension on dabigatran   Patient Measurements: Height: _0  (165.1 cm) Weight: 172 lb 8 oz (78.245 kg) IBW/kg (Calculated) : 57  Vital Signs: Temp: 98.5 F (36.9 C) (02/09 0530) Temp Source: Oral (02/09 0530) BP: 111/47 mmHg (02/09 0530) Pulse Rate: 78 (02/09 0530)  Labs:  Recent Labs  12/18/14 0600 12/18/14 0632 12/19/14 0525 12/20/14 0515  HGB 8.9*  --  8.7* 8.9*  HCT 28.5*  --  27.3* 28.1*  PLT 292  --  257 272  LABPROT  --  16.6* 16.0* 17.2*  INR  --  1.33 1.27 1.39  CREATININE  --   --   --  0.81    Estimated Creatinine Clearance: 52.5 mL/min (by C-G formula based on Cr of 0.81).   Assessment: 81 y/o F with dementia was discharged 12/01/14 on apixaban following hospitalization for acute DVT in LLE, was reportedly changed to dabigatran due to insurance coverage.  Readmitted 12/14/14 through ED with worsening swelling and erythema of LLE.   Venous doppler 12/15/14 again demonstrates acute proximal-vein thrombosis in LLE. Hospitalist consulted with heme/onc MD and issued orders this afternoon to switch from dabigatran (last dose 1023 on 2/5) to Lovenox bridge and begin conversion to warfarin with pharmacy dosing assistance.  Today, 12/20/2014 :  INR subtherapeutic but may be starting to respond after warfarin doses of 47m, 484m 86m71mnd 86mg61mb low low but stable. Pltc remains WNL. No overt bleeding reported. Renal: SCr WNL.  Estimated CrCl > 30 mL/min threshold for use of q12h Lovenox dosing. Diet: carb modified, charted as eating 75-100% of portions Significant drug-drug interactions with warfarin: doxycycline (can increase INR on warfarin and reduce warfarin dosage requirements)   Goal of Therapy:  INR 2.0 - 3.0 Full-dose bridging with Lovenox while awaiting therapeutic INR Monitor platelets by anticoagulation protocol: Yes   Plan:   Continue Lovenox 80 mg (1mg/19m  SQ q12h  Repeat Warfarin 6 mg PO x 1 at 1800  Daily PT/INR, CBC  Today is Day #5 Lovenox overlap with warfarin - must complete at least 5 days or until INR 2.0 or above on two consecutive days, whichever is longer, then switch to warfarin monotherapy.  If patient is discharged to SNF before above criteria are met, she will need INR checked daily until Lovenox bridging complete, then at least 3x weekly until INR stable and therapeutic on warfarin x 1 week, then at least 2x weekly x 1 week, then at least weekly while in SNF.  Recommend warfarin 86mg d67my if discharged to SNF today 2/9  Because of patient's dementia, warfarin teaching was provided to husband on 12/18/14.  Also had a discussion with patient this AM and her responses were appropriate but unclear whether she will retain any of the information. Teaching sheet has been provided for family and patient   JustinAdrian SaranmD, BCPS Pager [phone] 8:18 AM

## 2014-12-20 NOTE — Progress Notes (Signed)
Report called to Stacy at [facility].  Jomes Giraldo RN 

## 2014-12-20 NOTE — Discharge Summary (Signed)
Physician Discharge Summary  Paula LarsenSusan R [name] ZOX:[id number] DOB: 1927-08-22 DOA: 12/14/2014  PCP: Eustaquio BoydenJavier Gutierrez, MD  Admit date: 12/14/2014 Discharge date: 12/20/2014  Time spent: > 35 minutes  Recommendations for Outpatient Follow-up:  1. Please have pharmacy continue to assist with lovenox bridging to coumadin 2. Monitor serum creatinine 3. Monitor blood pressures 4. Suspect worsening in leg was due to worsening clot and not infection as such will not continue antibiotic on d/c  Discharge Diagnoses:  Principal Problem:   Cellulitis of left lower extremity Active Problems:   Hypothyroidism   Chronic diastolic CHF (congestive heart failure)   Dementia arising in the senium and presenium   DVT (deep venous thrombosis)   Diabetes mellitus type 2, controlled   Post-phlebitic syndrome   Discharge Condition: Stable  Diet recommendation: Carb modified  Filed Weights   12/14/14 1455 12/14/14 1939  Weight: 81.647 kg (180 lb) 78.245 kg (172 lb 8 oz)    History of present illness:  From original HPI: 81 y.o. female who lives in a skilled nursing facility and who was discharged on Jan 21st after being managed for a new DVT in the left lower extremity along with superimposed cellulitis. Patient also has a past medical history of COPD, dementia, diabetes mellitus, chronic congestive heart failure, diastolic. She has very poor functioning at baseline. She is wheelchair bound at the skilled nursing facility. She presented in January with complaints of left leg pain, swelling and redness. She was found to have a DVT along with cellulitis. She was treated with intravenous vancomycin initially and transitioned to Doxycycline on discharge. She was started on oral anticoagulants and was discharged back to her skilled nursing facility. She is accompanied by her husband today who mentioned that since Sunday, her leg has been getting worse.   Hospital Course:   Principal Problem:  Cellulitis of left  lower extremity / Post-phlebitic syndrome - Most likely postphlebitic syndrome as DVT is worse at left lower extremity based on imaging findings. Patient was transitioned from eliquis to pradaxa while at facility. I have discussed case with hematologist and currently recommendations are for Coumadin with Lovenox bridging.  - Pharmacy to assist with lovenox bridging to coumadin at SNF - d/c antibiotic as I suspect problem was secondary to worsening clot burden at LLE  Active Problems:  Hypothyroidism - Stable continue patient's Synthroid   Chronic diastolic CHF (congestive heart failure) - Stable and currently compensated continue current medical management   Dementia arising in the senium and presenium - Stable continue current medical management   DVT (deep venous thrombosis) - Please see discussion above. - worsened on pradaxa   Diabetes mellitus type 2, controlled - Carb modified diet - Continue SSI   Procedures:  None  Consultations:  None  Discharge Exam: Filed Vitals:   12/20/14 0530  BP: 111/47  Pulse: 78  Temp: 98.5 F (36.9 C)  Resp: 20    General: Pt in nad, alert and awake Cardiovascular: rrr, no mrg  Respiratory: cta bl, no wheezes  Discharge Instructions   Discharge Instructions    Call MD for:  severe uncontrolled pain    Complete by:  As directed      Call MD for:  temperature >100.4    Complete by:  As directed      Diet - low sodium heart healthy    Complete by:  As directed      Discharge instructions    Complete by:  As directed   Please have pharmacy  continue to dose and assist with lovenox bridging to coumadin.     Increase activity slowly    Complete by:  As directed           Current Discharge Medication List    START taking these medications   Details  enoxaparin (LOVENOX) 80 MG/0.8ML injection Inject 0.8 mLs (80 mg total) into the skin every 12 (twelve) hours. Qty: 22.4 Syringe    warfarin (COUMADIN) 6 MG tablet Take  1 tablet (6 mg total) by mouth one time only at 6 PM.      CONTINUE these medications which have CHANGED   Details  clonazePAM (KLONOPIN) 0.5 MG tablet Take 1 tablet (0.5 mg total) by mouth 2 (two) times daily as needed for anxiety. Qty: 5 tablet, Refills: 0      CONTINUE these medications which have NOT CHANGED   Details  acetaminophen (TYLENOL) 500 MG tablet Take 500 mg by mouth every 6 (six) hours as needed (pain).    albuterol (PROVENTIL HFA;VENTOLIN HFA) 108 (90 BASE) MCG/ACT inhaler Inhale 2 puffs into the lungs every 6 (six) hours as needed for wheezing or shortness of breath. Qty: 3 Inhaler, Refills: 0    alum & mag hydroxide-simeth (MAALOX/MYLANTA) 200-200-20 MG/5ML suspension Take 30 mLs by mouth every 4 (four) hours as needed for indigestion or heartburn.    Calcium Carbonate-Vitamin D (CALCARB 600/D) 600-400 MG-UNIT per tablet Take 1 tablet by mouth every other day.     carvedilol (COREG) 6.25 MG tablet Take 6.25 mg by mouth 2 (two) times daily with a meal.    Cholecalciferol (VITAMIN D3) 5000 UNITS TABS Take 5,000 Units by mouth every other day.     cyanocobalamin 500 MCG tablet Take 500 mcg by mouth daily.    dextromethorphan (DELSYM) 30 MG/5ML liquid Take 10 mLs by mouth at bedtime as needed for cough.    !! divalproex (DEPAKOTE) 250 MG DR tablet Take 250 mg by mouth 3 (three) times daily.    fluticasone (FLONASE) 50 MCG/ACT nasal spray Place 1 spray into both nostrils daily. Qty: 16 g, Refills: 3    furosemide (LASIX) 20 MG tablet Take 2 tablets (40 mg total) by mouth daily.    guaifenesin (ROBITUSSIN) 100 MG/5ML syrup Take 200 mg by mouth every 6 (six) hours as needed for cough.    haloperidol (HALDOL) 0.5 MG tablet Take 1 tablet (0.5 mg total) by mouth every 8 (eight) hours as needed for agitation.    ipratropium-albuterol (DUONEB) 0.5-2.5 (3) MG/3ML SOLN Take 3 mLs by nebulization 3 (three) times daily.    levothyroxine (SYNTHROID, LEVOTHROID) 175 MCG  tablet Take 175 mcg by mouth daily before breakfast.     loperamide (IMODIUM) 2 MG capsule Take 2 mg by mouth as needed for diarrhea or loose stools.    loratadine (CLARITIN) 10 MG tablet Take 10 mg by mouth daily.    losartan (COZAAR) 50 MG tablet Take 50 mg by mouth daily.    magnesium hydroxide (MILK OF MAGNESIA) 400 MG/5ML suspension Take 30 mLs by mouth daily as needed for mild constipation.    metFORMIN (GLUCOPHAGE-XR) 500 MG 24 hr tablet Take 500 mg by mouth daily with breakfast.     OLANZapine (ZYPREXA) 10 MG tablet Take 10 mg by mouth at bedtime.    pantoprazole (PROTONIX) 40 MG tablet Take 40 mg by mouth every other day.     Polyethyl Glycol-Propyl Glycol (SYSTANE) 0.4-0.3 % SOLN Apply 1 drop to eye 2 (two) times daily as  needed (dry eyes).    potassium chloride SA (K-DUR,KLOR-CON) 20 MEQ tablet Take 20 mEq by mouth daily.    rivastigmine (EXELON) 4.6 mg/24hr Place 4.6 mg onto the skin every morning.    Skin Protectants, Misc. (MINERIN) CREA Apply 1 application topically 2 (two) times daily. To lower extremities and feet.    traZODone (DESYREL) 150 MG tablet Take 150 mg by mouth at bedtime.    !! divalproex (DEPAKOTE) 125 MG DR tablet Take 2 tablets (250 mg total) by mouth 3 (three) times daily with meals.    fexofenadine (ALLEGRA) 180 MG tablet Take 1 tablet (180 mg total) by mouth daily. Qty: 30 tablet, Refills: 3    Neomycin-Bacitracin-Polymyxin (TRIPLE ANTIBIOTIC EX) Apply topically as needed (minor skin tears or abrasions.).     !! - Potential duplicate medications found. Please discuss with provider.    STOP taking these medications     dabigatran (PRADAXA) 150 MG CAPS capsule      doxycycline (VIBRA-TABS) 100 MG tablet      apixaban (ELIQUIS) 5 MG TABS tablet      apixaban (ELIQUIS) 5 MG TABS tablet        Allergies  Allergen Reactions  . Bactrim [Sulfamethoxazole-Trimethoprim] Anaphylaxis  . Contrast Media [Iodinated Diagnostic Agents] Anaphylaxis  and Swelling  . Iodine Anaphylaxis  . Iohexol Anaphylaxis, Shortness Of Breath and Swelling     Desc: PT STATES SHE HAD A SEVERE REACTION TO IV CONRAST WITH THROAT SWELLING AND SOB. SHE WAS ADMITTED TO THE HOSPITAL. SHE HAS NEVER HAD CONTRAST AGAIN.   Marland Kitchen Sulfa Antibiotics Anaphylaxis  . Aricept [Donepezil Hcl] Nausea And Vomiting  . Cymbalta [Duloxetine Hcl] Other (See Comments)    HALLUCINATIONS    . Delsym [Dextromethorphan] Other (See Comments)    Hallucination  . Guaifenesin & Derivatives Other (See Comments)    Hallucination  . Hydromet [Hydrocodone-Homatropine] Other (See Comments)    hallucinations  . Sodium Benzoate [Nutritional Supplements]     Per mar  . Trimethoprim     From mar  . Avelox [Moxifloxacin Hcl In Nacl] Rash  . Ciprofloxacin Itching  . Gadolinium Derivatives Other (See Comments)    Unknown    . Penicillins Rash    Rash as a child  . Quinolones Rash      The results of significant diagnostics from this hospitalization (including imaging, microbiology, ancillary and laboratory) are listed below for reference.    Significant Diagnostic Studies: No results found.  Microbiology: Recent Results (from the past 240 hour(s))  Culture, blood (routine x 2)     Status: None   Collection Time: 12/14/14  3:40 PM  Result Value Ref Range Status   Specimen Description BLOOD RIGHT ANTECUBITAL  Final   Special Requests BOTTLES DRAWN AEROBIC AND ANAEROBIC [facility] EACH  Final   Culture   Final    NO GROWTH 5 DAYS Performed at [facility]    Report Status 12/20/2014 FINAL  Final  Culture, blood (routine x 2)     Status: None   Collection Time: 12/14/14  3:43 PM  Result Value Ref Range Status   Specimen Description BLOOD RIGHT HAND  Final   Special Requests BOTTLES DRAWN AEROBIC ONLY 2CC  Final   Culture   Final    NO GROWTH 5 DAYS Performed at [facility]    Report Status 12/20/2014 FINAL  Final  MRSA PCR Screening     Status: None    Collection Time: 12/14/14  7:39 PM  Result Value Ref Range Status   MRSA by PCR NEGATIVE NEGATIVE Final    Comment:        The GeneXpert MRSA Assay (FDA approved for NASAL specimens only), is one component of a comprehensive MRSA colonization surveillance program. It is not intended to diagnose MRSA infection nor to guide or monitor treatment for MRSA infections.      Labs: Basic Metabolic Panel:  Recent Labs Lab 12/14/14 1540 12/15/14 0515 12/20/14 0515  NA 142 143  --   K 3.4* 3.9  --   CL 105 107  --   CO2 28 28  --   GLUCOSE 113* 92  --   BUN 10 14  --   CREATININE 0.86 0.91 0.81  CALCIUM 9.0 8.9  --    Liver Function Tests: No results for input(s): AST, ALT, ALKPHOS, BILITOT, PROT, ALBUMIN in the last 168 hours. No results for input(s): LIPASE, AMYLASE in the last 168 hours. No results for input(s): AMMONIA in the last 168 hours. CBC:  Recent Labs Lab 12/14/14 1540 12/15/14 0515 12/18/14 0600 12/19/14 0525 12/20/14 0515  WBC 11.5* 9.3 8.2 8.4 7.3  NEUTROABS 7.0  --   --   --   --   HGB 9.4* 8.1* 8.9* 8.7* 8.9*  HCT 30.1* 26.1* 28.5* 27.3* 28.1*  MCV 109.9* 109.2* 110.9* 110.1* 110.2*  PLT 291 244 292 257 272   Cardiac Enzymes: No results for input(s): CKTOTAL, CKMB, CKMBINDEX, TROPONINI in the last 168 hours. BNP: BNP (last 3 results)  Recent Labs  11/29/14 1805  BNP 31.8    ProBNP (last 3 results)  Recent Labs  03/22/14 1054 08/15/14 1735  PROBNP 183.0* 171.4    CBG:  Recent Labs Lab 12/18/14 0725 12/18/14 1203 12/18/14 1616 12/18/14 2221 12/19/14 0719  GLUCAP 90 121* 112* 120* 99       Signed:  Penny Pia  [facility] 12/20/2014, 12:14 PM

## 2014-12-20 NOTE — Progress Notes (Signed)
Clinical Social Work  CSW [facility]ed [facility] summary to [facility] who is agreeable to accept patient today. SNF aware of no confirmation on [facility] for [facility] # but reports they have had patient in the past and verified her number and can accept her today. CSW prepared DC packet with FL2, DNR form, DC summary and hard scripts included. CSW informed patient and husband of DC plans. Husband reports he feels comfortable with patient going to SNF because she has been there in the past and knows she will receive good care. Husband reports he will visit patient at SNF and try to ease her mind with several transfers. Husband reports that patient is often anxious but once he visits he is able to calm her down and reassure her that everything will be fine. Husband prefers [facility] for transportation and is aware of no guarantee of payment. RN to call report to SNF. [facility] arranged for 2 pm. Sharin MonsPTAR #[phone].  CSW is signing off but available if needed.  JohnstonHolly Shahmeer Bunn, Kentucky[place]CSW [phone]

## 2015-01-11 ENCOUNTER — Inpatient Hospital Stay (HOSPITAL_COMMUNITY)
Admission: EM | Admit: 2015-01-11 | Discharge: 2015-01-13 | DRG: 394 | Disposition: A | Discharge status: Skilled Nursing Facility | Payer: Medicare Other | Attending: Internal Medicine | Admitting: Internal Medicine

## 2015-01-11 ENCOUNTER — Encounter (HOSPITAL_COMMUNITY): Payer: Self-pay | Admitting: Emergency Medicine

## 2015-01-11 DIAGNOSIS — Z7901 Long term (current) use of anticoagulants: Secondary | ICD-10-CM | POA: Diagnosis not present

## 2015-01-11 DIAGNOSIS — K5900 Constipation, unspecified: Secondary | ICD-10-CM | POA: Diagnosis present

## 2015-01-11 DIAGNOSIS — I82409 Acute embolism and thrombosis of unspecified deep veins of unspecified lower extremity: Secondary | ICD-10-CM | POA: Diagnosis present

## 2015-01-11 DIAGNOSIS — E039 Hypothyroidism, unspecified: Secondary | ICD-10-CM | POA: Diagnosis present

## 2015-01-11 DIAGNOSIS — M7989 Other specified soft tissue disorders: Secondary | ICD-10-CM | POA: Diagnosis present

## 2015-01-11 DIAGNOSIS — I5032 Chronic diastolic (congestive) heart failure: Secondary | ICD-10-CM | POA: Diagnosis present

## 2015-01-11 DIAGNOSIS — K219 Gastro-esophageal reflux disease without esophagitis: Secondary | ICD-10-CM | POA: Diagnosis present

## 2015-01-11 DIAGNOSIS — K922 Gastrointestinal hemorrhage, unspecified: Secondary | ICD-10-CM | POA: Diagnosis present

## 2015-01-11 DIAGNOSIS — L03116 Cellulitis of left lower limb: Secondary | ICD-10-CM | POA: Diagnosis not present

## 2015-01-11 DIAGNOSIS — D62 Acute posthemorrhagic anemia: Secondary | ICD-10-CM | POA: Diagnosis present

## 2015-01-11 DIAGNOSIS — Z8249 Family history of ischemic heart disease and other diseases of the circulatory system: Secondary | ICD-10-CM

## 2015-01-11 DIAGNOSIS — F329 Major depressive disorder, single episode, unspecified: Secondary | ICD-10-CM | POA: Diagnosis present

## 2015-01-11 DIAGNOSIS — I10 Essential (primary) hypertension: Secondary | ICD-10-CM | POA: Diagnosis present

## 2015-01-11 DIAGNOSIS — E119 Type 2 diabetes mellitus without complications: Secondary | ICD-10-CM

## 2015-01-11 DIAGNOSIS — K648 Other hemorrhoids: Secondary | ICD-10-CM | POA: Diagnosis present

## 2015-01-11 DIAGNOSIS — F0390 Unspecified dementia without behavioral disturbance: Secondary | ICD-10-CM | POA: Diagnosis present

## 2015-01-11 DIAGNOSIS — K625 Hemorrhage of anus and rectum: Secondary | ICD-10-CM | POA: Diagnosis present

## 2015-01-11 DIAGNOSIS — Z86718 Personal history of other venous thrombosis and embolism: Secondary | ICD-10-CM | POA: Diagnosis not present

## 2015-01-11 DIAGNOSIS — K921 Melena: Secondary | ICD-10-CM | POA: Diagnosis present

## 2015-01-11 DIAGNOSIS — D72829 Elevated white blood cell count, unspecified: Secondary | ICD-10-CM | POA: Diagnosis present

## 2015-01-11 DIAGNOSIS — D649 Anemia, unspecified: Secondary | ICD-10-CM

## 2015-01-11 HISTORY — DX: Acute embolism and thrombosis of unspecified deep veins of unspecified lower extremity: I82.409

## 2015-01-11 HISTORY — DX: Type 2 diabetes mellitus without complications: E11.9

## 2015-01-11 LAB — CBC WITH DIFFERENTIAL/PLATELET
Basophils Absolute: 0.1 10*3/uL (ref 0.0–0.1)
Basophils Relative: 0 % (ref 0–1)
EOS ABS: 0.4 10*3/uL (ref 0.0–0.7)
EOS PCT: 3 % (ref 0–5)
HEMATOCRIT: 34 % — AB (ref 36.0–46.0)
Hemoglobin: 10.8 g/dL — ABNORMAL LOW (ref 12.0–15.0)
LYMPHS ABS: 3 10*3/uL (ref 0.7–4.0)
LYMPHS PCT: 22 % (ref 12–46)
MCH: 34.7 pg — AB (ref 26.0–34.0)
MCHC: 31.8 g/dL (ref 30.0–36.0)
MCV: 109.3 fL — AB (ref 78.0–100.0)
MONOS PCT: 9 % (ref 3–12)
Monocytes Absolute: 1.3 10*3/uL — ABNORMAL HIGH (ref 0.1–1.0)
Neutro Abs: 9 10*3/uL — ABNORMAL HIGH (ref 1.7–7.7)
Neutrophils Relative %: 66 % (ref 43–77)
Platelets: 286 10*3/uL (ref 150–400)
RBC: 3.11 MIL/uL — ABNORMAL LOW (ref 3.87–5.11)
RDW: 15.6 % — ABNORMAL HIGH (ref 11.5–15.5)
WBC: 13.8 10*3/uL — ABNORMAL HIGH (ref 4.0–10.5)

## 2015-01-11 LAB — PROTIME-INR
INR: 1.71 — AB (ref 0.00–1.49)
Prothrombin Time: 20.2 seconds — ABNORMAL HIGH (ref 11.6–15.2)

## 2015-01-11 LAB — COMPREHENSIVE METABOLIC PANEL
ALBUMIN: 3.7 g/dL (ref 3.5–5.2)
ALT: 9 U/L (ref 0–35)
AST: 20 U/L (ref 0–37)
Alkaline Phosphatase: 48 U/L (ref 39–117)
Anion gap: 4 — ABNORMAL LOW (ref 5–15)
BILIRUBIN TOTAL: 0.4 mg/dL (ref 0.3–1.2)
BUN: 14 mg/dL (ref 6–23)
CO2: 29 mmol/L (ref 19–32)
Calcium: 9 mg/dL (ref 8.4–10.5)
Chloride: 106 mmol/L (ref 96–112)
Creatinine, Ser: 0.85 mg/dL (ref 0.50–1.10)
GFR calc Af Amer: 70 mL/min — ABNORMAL LOW (ref >90)
GFR calc non Af Amer: 61 mL/min — ABNORMAL LOW (ref >90)
Glucose, Bld: 129 mg/dL — ABNORMAL HIGH (ref 70–99)
POTASSIUM: 4.3 mmol/L (ref 3.5–5.1)
SODIUM: 139 mmol/L (ref 135–145)
Total Protein: 6.3 g/dL (ref 6.0–8.3)

## 2015-01-11 LAB — TYPE AND SCREEN
ABO/RH(D): O POS
Antibody Screen: NEGATIVE

## 2015-01-11 LAB — POC OCCULT BLOOD, ED: FECAL OCCULT BLD: POSITIVE — AB

## 2015-01-11 LAB — APTT: APTT: 53 s — AB (ref 24–37)

## 2015-01-11 LAB — TSH: TSH: 9.885 u[IU]/mL — AB (ref 0.350–4.500)

## 2015-01-11 LAB — ABO/RH: ABO/RH(D): O POS

## 2015-01-11 MED ORDER — FUROSEMIDE 20 MG PO TABS
20.0000 mg | ORAL_TABLET | Freq: Every day | ORAL | Status: DC
  Administered 2015-01-12 – 2015-01-13 (×2): 20 mg via ORAL
  Filled 2015-01-11 (×2): qty 1

## 2015-01-11 MED ORDER — SODIUM CHLORIDE 0.9 % IV SOLN
INTRAVENOUS | Status: AC
  Administered 2015-01-11: 17:00:00 via INTRAVENOUS
  Administered 2015-01-12: 1000 mL via INTRAVENOUS

## 2015-01-11 MED ORDER — LEVOTHYROXINE SODIUM 175 MCG PO TABS
175.0000 ug | ORAL_TABLET | Freq: Every day | ORAL | Status: DC
  Administered 2015-01-12 – 2015-01-13 (×2): 175 ug via ORAL
  Filled 2015-01-11 (×3): qty 1

## 2015-01-11 MED ORDER — VITAMIN D3 25 MCG (1000 UNIT) PO TABS
5000.0000 [IU] | ORAL_TABLET | ORAL | Status: DC
  Administered 2015-01-12: 5000 [IU] via ORAL
  Filled 2015-01-11: qty 5

## 2015-01-11 MED ORDER — POLYETHYL GLYCOL-PROPYL GLYCOL 0.4-0.3 % OP GEL: 1.0000 [drp] | Freq: Two times a day (BID) | OPHTHALMIC | Status: DC

## 2015-01-11 MED ORDER — METFORMIN HCL ER 500 MG PO TB24
500.0000 mg | ORAL_TABLET | Freq: Every day | ORAL | Status: DC
  Administered 2015-01-12 – 2015-01-13 (×2): 500 mg via ORAL
  Filled 2015-01-11 (×3): qty 1

## 2015-01-11 MED ORDER — ALBUTEROL SULFATE (2.5 MG/3ML) 0.083% IN NEBU
2.5000 mg | INHALATION_SOLUTION | Freq: Three times a day (TID) | RESPIRATORY_TRACT | Status: DC
  Administered 2015-01-11 – 2015-01-12 (×2): 2.5 mg via RESPIRATORY_TRACT
  Filled 2015-01-11 (×2): qty 3

## 2015-01-11 MED ORDER — OLANZAPINE 10 MG PO TABS
10.0000 mg | ORAL_TABLET | Freq: Every day | ORAL | Status: DC
  Administered 2015-01-12 – 2015-01-13 (×2): 10 mg via ORAL
  Filled 2015-01-11 (×2): qty 1

## 2015-01-11 MED ORDER — VITAMIN B-12 1000 MCG PO TABS
500.0000 ug | ORAL_TABLET | Freq: Every day | ORAL | Status: DC
  Administered 2015-01-12 – 2015-01-13 (×2): 500 ug via ORAL
  Filled 2015-01-11 (×2): qty 0.5

## 2015-01-11 MED ORDER — CARVEDILOL 6.25 MG PO TABS
6.2500 mg | ORAL_TABLET | Freq: Two times a day (BID) | ORAL | Status: DC
  Administered 2015-01-11 – 2015-01-13 (×4): 6.25 mg via ORAL
  Filled 2015-01-11 (×6): qty 1

## 2015-01-11 MED ORDER — VANCOMYCIN HCL IN DEXTROSE 1-5 GM/200ML-% IV SOLN: 1000.0000 mg | Freq: Once | INTRAVENOUS | Status: DC

## 2015-01-11 MED ORDER — ONDANSETRON HCL 4 MG PO TABS: 4.0000 mg | ORAL_TABLET | Freq: Four times a day (QID) | ORAL | Status: DC | PRN

## 2015-01-11 MED ORDER — PANTOPRAZOLE SODIUM 40 MG IV SOLR
40.0000 mg | INTRAVENOUS | Status: AC
  Administered 2015-01-11: 40 mg via INTRAVENOUS
  Filled 2015-01-11: qty 40

## 2015-01-11 MED ORDER — ALBUTEROL SULFATE (2.5 MG/3ML) 0.083% IN NEBU: 2.5000 mg | INHALATION_SOLUTION | Freq: Four times a day (QID) | RESPIRATORY_TRACT | Status: DC | PRN

## 2015-01-11 MED ORDER — LOSARTAN POTASSIUM 50 MG PO TABS
50.0000 mg | ORAL_TABLET | Freq: Every day | ORAL | Status: DC
  Administered 2015-01-12 – 2015-01-13 (×2): 50 mg via ORAL
  Filled 2015-01-11 (×2): qty 1

## 2015-01-11 MED ORDER — DIVALPROEX SODIUM ER 250 MG PO TB24
250.0000 mg | ORAL_TABLET | Freq: Three times a day (TID) | ORAL | Status: DC
  Administered 2015-01-11 – 2015-01-13 (×6): 250 mg via ORAL
  Filled 2015-01-11 (×8): qty 1

## 2015-01-11 MED ORDER — LORATADINE 10 MG PO TABS
10.0000 mg | ORAL_TABLET | Freq: Every day | ORAL | Status: DC
  Administered 2015-01-11 – 2015-01-13 (×3): 10 mg via ORAL
  Filled 2015-01-11 (×3): qty 1

## 2015-01-11 MED ORDER — ACETAMINOPHEN 500 MG PO TABS
500.0000 mg | ORAL_TABLET | Freq: Four times a day (QID) | ORAL | Status: DC
  Administered 2015-01-11 – 2015-01-13 (×7): 500 mg via ORAL
  Filled 2015-01-11 (×10): qty 1

## 2015-01-11 MED ORDER — FLUTICASONE PROPIONATE 50 MCG/ACT NA SUSP
1.0000 | Freq: Every day | NASAL | Status: DC
  Administered 2015-01-13: 1 via NASAL
  Filled 2015-01-11 (×2): qty 16

## 2015-01-11 MED ORDER — ONDANSETRON HCL 4 MG/2ML IJ SOLN: 4.0000 mg | Freq: Three times a day (TID) | INTRAMUSCULAR | Status: DC | PRN

## 2015-01-11 MED ORDER — CLINDAMYCIN PHOSPHATE 600 MG/50ML IV SOLN
600.0000 mg | Freq: Three times a day (TID) | INTRAVENOUS | Status: DC
Start: 2015-01-11 — End: 2015-01-13
  Administered 2015-01-11 – 2015-01-13 (×6): 600 mg via INTRAVENOUS
  Filled 2015-01-11 (×8): qty 50

## 2015-01-11 MED ORDER — HALOPERIDOL 0.5 MG PO TABS
0.5000 mg | ORAL_TABLET | Freq: Three times a day (TID) | ORAL | Status: DC
  Administered 2015-01-11 – 2015-01-13 (×6): 0.5 mg via ORAL
  Filled 2015-01-11 (×8): qty 1

## 2015-01-11 MED ORDER — POLYVINYL ALCOHOL 1.4 % OP SOLN
1.0000 [drp] | Freq: Two times a day (BID) | OPHTHALMIC | Status: DC
  Administered 2015-01-11 – 2015-01-13 (×3): 1 [drp] via OPHTHALMIC
  Filled 2015-01-11 (×2): qty 15

## 2015-01-11 MED ORDER — LOPERAMIDE HCL 2 MG PO CAPS: 2.0000 mg | ORAL_CAPSULE | Freq: Every day | ORAL | Status: DC | PRN

## 2015-01-11 MED ORDER — CALCIUM CARBONATE-VITAMIN D 500-200 MG-UNIT PO TABS
1.0000 | ORAL_TABLET | Freq: Every day | ORAL | Status: DC
  Administered 2015-01-12 – 2015-01-13 (×2): 1 via ORAL
  Filled 2015-01-11 (×3): qty 1

## 2015-01-11 MED ORDER — TRAZODONE HCL 150 MG PO TABS
150.0000 mg | ORAL_TABLET | Freq: Every day | ORAL | Status: DC
Start: 2015-01-11 — End: 2015-01-13
  Administered 2015-01-11 – 2015-01-12 (×2): 150 mg via ORAL
  Filled 2015-01-11 (×3): qty 1

## 2015-01-11 MED ORDER — PANTOPRAZOLE SODIUM 40 MG IV SOLR
40.0000 mg | Freq: Two times a day (BID) | INTRAVENOUS | Status: DC
  Administered 2015-01-11 – 2015-01-13 (×5): 40 mg via INTRAVENOUS
  Filled 2015-01-11 (×6): qty 40

## 2015-01-11 MED ORDER — RIVASTIGMINE 4.6 MG/24HR TD PT24
4.6000 mg | MEDICATED_PATCH | Freq: Every day | TRANSDERMAL | Status: DC
  Administered 2015-01-12 – 2015-01-13 (×2): 4.6 mg via TRANSDERMAL
  Filled 2015-01-11 (×2): qty 1

## 2015-01-11 MED ORDER — CLONAZEPAM 0.5 MG PO TABS
0.5000 mg | ORAL_TABLET | Freq: Two times a day (BID) | ORAL | Status: DC | PRN
Start: 2015-01-11 — End: 2015-01-13

## 2015-01-11 MED ORDER — ONDANSETRON HCL 4 MG/2ML IJ SOLN: 4.0000 mg | Freq: Four times a day (QID) | INTRAMUSCULAR | Status: DC | PRN

## 2015-01-11 MED ORDER — CALCIUM-VITAMIN D 600-400 MG-UNIT PO TABS: 1.0000 | ORAL_TABLET | Freq: Every day | ORAL | Status: DC

## 2015-01-11 MED ORDER — POTASSIUM CHLORIDE CRYS ER 20 MEQ PO TBCR
20.0000 meq | EXTENDED_RELEASE_TABLET | Freq: Every day | ORAL | Status: DC
  Administered 2015-01-12 – 2015-01-13 (×2): 20 meq via ORAL
  Filled 2015-01-11 (×2): qty 1

## 2015-01-11 MED ORDER — FAMOTIDINE IN NACL 20-0.9 MG/50ML-% IV SOLN
20.0000 mg | INTRAVENOUS | Status: AC
  Administered 2015-01-11: 20 mg via INTRAVENOUS
  Filled 2015-01-11: qty 50

## 2015-01-11 NOTE — ED Notes (Signed)
Bed: WA06 Expected date:  Expected time:  Means of arrival:  Comments: "rectal hemorrhage"

## 2015-01-11 NOTE — ED Provider Notes (Addendum)
CSN: [id number]     Arrival date & time 01/11/15  1136 History   First MD Initiated Contact with Patient 01/11/15 1143     Chief Complaint  Patient presents with  . Rectal Bleeding     (Consider location/radiation/quality/duration/timing/severity/associated sxs/prior Treatment) HPI Comments: [facility], she states that she has been constipated, this morning she had a hard bowel movement at approximately 5:00 in the morning, she states that she has had a small amount of bright red blood since that time. She does have a history of hemorrhoids, she does take Coumadin for a DVT which was recently diagnosed in her leg. She denies any complaints of leg pain. She has no rectal pain, no abdominal pain, no nausea or vomiting. She denies any history of gastrointestinal bleeding. The symptoms are intermittent, started this morning, are not associated with vomiting or diarrhea.  Patient is a 81 y.o. female presenting with hematochezia. The history is provided by the patient and the spouse.  Rectal Bleeding   Past Medical History  Diagnosis Date  . DVT (deep venous thrombosis)   . Hypertension   . Diabetes mellitus without complication   . CHF (congestive heart failure)   . GERD (gastroesophageal reflux disease)    History reviewed. No pertinent past surgical history. No family history on file. History  Substance Use Topics  . Smoking status: Not on file  . Smokeless tobacco: Not on file  . Alcohol Use: Not on file   OB History    No data available     Review of Systems  Gastrointestinal: Positive for hematochezia.  All other systems reviewed and are negative.     Allergies  Donepezil; Duloxetine; Iodine; Penicillins; Quinolones; Sodium benzoate; Sulfa antibiotics; and Trimethoprim  Home Medications   Prior to Admission medications   Medication Sig Start Date End Date Taking? Authorizing Provider  acetaminophen (TYLENOL) 500 MG tablet Take 500 mg by  mouth 4 (four) times daily.   Yes Historical Provider, MD  albuterol (PROVENTIL HFA;VENTOLIN HFA) 108 (90 BASE) MCG/ACT inhaler Inhale 1 puff into the lungs 4 (four) times daily as needed for wheezing or shortness of breath.   Yes Historical Provider, MD  albuterol (PROVENTIL) (2.5 MG/3ML) 0.083% nebulizer solution Take 2.5 mg by nebulization 3 (three) times daily.   Yes Historical Provider, MD  Calcium Carb-Cholecalciferol (CALCIUM-VITAMIN D) 600-400 MG-UNIT TABS Take 1 tablet by mouth daily.   Yes Historical Provider, MD  carvedilol (COREG) 6.25 MG tablet Take 6.25 mg by mouth 2 (two) times daily with a meal.   Yes Historical Provider, MD  Cholecalciferol (VITAMIN D3) 5000 UNITS CAPS Take 1 capsule by mouth every other day.   Yes Historical Provider, MD  clonazePAM (KLONOPIN) 0.5 MG tablet Take 0.5 mg by mouth every 12 (twelve) hours as needed for anxiety.   Yes Historical Provider, MD  dextromethorphan (DELSYM) 30 MG/5ML liquid Take 60 mg by mouth 2 (two) times daily as needed for cough.   Yes Historical Provider, MD  divalproex (DEPAKOTE ER) 250 MG 24 hr tablet Take 250 mg by mouth 3 (three) times daily.   Yes Historical Provider, MD  fexofenadine (ALLEGRA) 180 MG tablet Take 180 mg by mouth daily.   Yes Historical Provider, MD  fluticasone (FLONASE) 50 MCG/ACT nasal spray Place 1 spray into both nostrils daily.   Yes Historical Provider, MD  furosemide (LASIX) 20 MG tablet Take 20 mg by mouth daily.   Yes Historical Provider, MD  Glucose Blood (  BLOOD GLUCOSE TEST STRIPS) STRP 1 strip by In Vitro route 2 (two) times daily.   Yes Historical Provider, MD  guaiFENesin (ROBITUSSIN) 100 MG/5ML liquid Take 200 mg by mouth every 6 (six) hours as needed for cough.   Yes Historical Provider, MD  haloperidol (HALDOL) 0.5 MG tablet Take 0.5 mg by mouth 3 (three) times daily.   Yes Historical Provider, MD  levothyroxine (SYNTHROID, LEVOTHROID) 175 MCG tablet Take 175 mcg by mouth daily before breakfast.   Yes  Historical Provider, MD  loperamide (IMODIUM) 2 MG capsule Take 2 mg by mouth daily as needed for diarrhea or loose stools.   Yes Historical Provider, MD  loratadine (CLARITIN) 10 MG tablet Take 10 mg by mouth daily.   Yes Historical Provider, MD  losartan (COZAAR) 50 MG tablet Take 50 mg by mouth daily.   Yes Historical Provider, MD  metFORMIN (GLUMETZA) 500 MG (MOD) 24 hr tablet Take 500 mg by mouth daily with breakfast.   Yes Historical Provider, MD  neomycin-bacitracin-polymyxin (NEOSPORIN) [phone] ointment Apply 1 application topically 3 (three) times daily as needed (affected areas).   Yes Historical Provider, MD  OLANZapine (ZYPREXA) 10 MG tablet Take 10 mg by mouth daily.   Yes Historical Provider, MD  pantoprazole (PROTONIX) 40 MG tablet Take 40 mg by mouth daily.   Yes Historical Provider, MD  Polyethyl Glycol-Propyl Glycol 0.4-0.3 % GEL Place 1 drop into both eyes 2 (two) times daily.   Yes Historical Provider, MD  potassium chloride SA (K-DUR,KLOR-CON) 20 MEQ tablet Take 20 mEq by mouth daily.   Yes Historical Provider, MD  rivastigmine (EXELON) 4.6 mg/24hr Place 4.6 mg onto the skin daily.   Yes Historical Provider, MD  TOPICALS DOCUMENTATION OPTIME Apply 1 application topically 2 (two) times daily.   Yes Historical Provider, MD  traZODone (DESYREL) 150 MG tablet Take 150 mg by mouth at bedtime.   Yes Historical Provider, MD  vitamin B-12 (CYANOCOBALAMIN) 500 MCG tablet Take 500 mcg by mouth daily.   Yes Historical Provider, MD  warfarin (COUMADIN) 2 MG tablet Take 2 mg by mouth daily. Take along with 5 mg=7mg  daily.   Yes Historical Provider, MD  warfarin (COUMADIN) 5 MG tablet Take 5 mg by mouth daily. Take along with 2 mg=7 mg daily.   Yes Historical Provider, MD   BP 141/60 mmHg  Pulse 81  Temp(Src) 97.8 F (36.6 C) (Oral)  Resp 18  SpO2 94% Physical Exam  Constitutional: She appears well-developed and well-nourished. No distress.  HENT:  Head: Normocephalic and  atraumatic.  Mouth/Throat: Oropharynx is clear and moist. No oropharyngeal exudate.  Eyes: Conjunctivae and EOM are normal. Pupils are equal, round, and reactive to light. Right eye exhibits no discharge. Left eye exhibits no discharge. No scleral icterus.  Neck: Normal range of motion. Neck supple. No JVD present. No thyromegaly present.  Cardiovascular: Normal rate, regular rhythm, normal heart sounds and intact distal pulses.  Exam reveals no gallop and no friction rub.   No murmur heard. Pulmonary/Chest: Effort normal and breath sounds normal. No respiratory distress. She has no wheezes. She has no rales.  Abdominal: Soft. Bowel sounds are normal. She exhibits no distension and no mass. There is no tenderness.  Genitourinary:  Chaperone present for exam.  Musculoskeletal: Normal range of motion. She exhibits no edema or tenderness.  Lymphadenopathy:    She has no cervical adenopathy.  Neurological: She is alert. Coordination normal.  Skin: Skin is warm and dry. No rash noted. No  erythema.  Psychiatric: She has a normal mood and affect. Her behavior is normal.  Nursing note and vitals reviewed.   ED Course  Procedures (including critical care time) Labs Review Labs Reviewed  CBC WITH DIFFERENTIAL/PLATELET - Abnormal; Notable for the following:    WBC 13.8 (*)    RBC 3.11 (*)    Hemoglobin 10.8 (*)    HCT 34.0 (*)    MCV 109.3 (*)    MCH 34.7 (*)    RDW 15.6 (*)    Neutro Abs 9.0 (*)    Monocytes Absolute 1.3 (*)    All other components within normal limits  COMPREHENSIVE METABOLIC PANEL - Abnormal; Notable for the following:    Glucose, Bld 129 (*)    GFR calc non Af Amer 61 (*)    GFR calc Af Amer 70 (*)    Anion gap 4 (*)    All other components within normal limits  APTT - Abnormal; Notable for the following:    aPTT 53 (*)    All other components within normal limits  PROTIME-INR - Abnormal; Notable for the following:    Prothrombin Time 20.2 (*)    INR 1.71 (*)     All other components within normal limits  POC OCCULT BLOOD, ED - Abnormal; Notable for the following:    Fecal Occult Bld POSITIVE (*)    All other components within normal limits  TYPE AND SCREEN  ABO/RH    Imaging Review No results found. As initially he was disoriented as  MDM   Final diagnoses:  Rectal bleeding  Anemia, unspecified anemia type    The patient has had no significant decline in her condition, her blood pressure and heart rate are normal, temperature is afebrile, hemoglobin is anemic, INR is subtherapeutic, I am concerned that she may have had rectal bleeding as she was Hemoccult positive, anoscopy shows no signs of acute bleeding, the patient will need to be admitted to the hospital, discussed with the hospitalist who will admit.  Procedure Note:  Anoscopy  Risks benefits alternatives of the procedure given to the patient Verbal Consent obtained Patient placed in the lateral decubitus position Anoscopy performed Findings  No acute bleeding and no lesions Patient tolerated procedure without any complaints  D/w Dr. Ewing Schlein - they will see the pt in the AM   Medical screening examination/treatment/procedure(s) were conducted as a shared visit with non-physician practitioner(s) and myself.  I personally evaluated the patient during the encounter.  Clinical Impression:   Final diagnoses:  Rectal bleeding  Anemia, unspecified anemia type           Vida Roller, MD 01/11/15 1537  Vida Roller, MD 01/11/15 [phone]

## 2015-01-11 NOTE — H&P (Addendum)
[facility] History and Physical  Anis Cinelli UEA:[id number] DOB: 06-27-1927 DOA: 01/11/2015  Referring physician: ER physician PCP: [name], MD   Chief Complaint: rectal bleed  HPI:  81 year old female from SNF with past medical history of hypertension, depression, dementia, DVT on anticoagulation with coumadin who presented to [facility] ED with reports of rectal bleed noted on the day prior to this admission. Pt reported being constipated and straining to have BM and that is when she noticed blood with BM. No reports of lightheadedness or loss of consciousness or falls. No reports of abdominal pain, nausea or vomiting. Pt did endorse left leg swelling with redness getting worse over a past day or so. No reports of fevers. No reports of dysuria or hematuria.  In ED, pt was hemodynamically stable. Hemoglobin was 10.8 on admission. INR was 1.7. She was admitted for further management of rectal bleed. Of note, she was given a dose of vancomycin in ED.   Assessment & Plan    Principal Problem: Lower GI bleed / rectal bleed / Acute blood loss anemia - in pt with history of hemorrhoids this may be a cause of bleed triggered by coumadin. Pt also reproted constipation and straining which may have contributed to bleeding as well - hemoglobin is 10.8 on admission - will continue to monitor CBC - call GI in am if pt continues to bleed - coumadin on hold for now  Active Problems: Cellulitis of left lower extremity / Leukocytosis - obtain LE doppler to rule out DVT - started clindamycin  DVT (deep venous thrombosis) - h/o DVT on coumadin but AC on hold due to risk of bleeding  Diabetes mellitus without complication - resume home med metformin   Essential hypertension - resume coreg, lasix and losartan  Hypothyroidism - continue synthroid  Diastolic CHF, chronic - resume lasix, coreg, losartan - AC on hold due to risk of bleeding   Depression - continue olanzapine, haldol  per NH regimen    DVT prophylaxis:  - On AC with coumadin which was placed on hold due to risk of bleeding   Radiological Exams on Admission: No results found.   Code Status: Full Family Communication: Plan of care discussed with the patient and her family at the bedside  Disposition Plan: Admit for further evaluation  Manson Passey, MD  [facility] Pager [phone]  Review of Systems:  Constitutional: Negative for fever, chills and malaise/fatigue. Negative for diaphoresis.  HENT: Negative for hearing loss, ear pain, nosebleeds, congestion, sore throat, neck pain, tinnitus and ear discharge.   Eyes: Negative for blurred vision, double vision, photophobia, pain, discharge and redness.  Respiratory: Negative for cough, hemoptysis, sputum production, shortness of breath, wheezing and stridor.   Cardiovascular: Negative for chest pain, palpitations, orthopnea, claudication and leg swelling.  Gastrointestinal: per HPI.  Genitourinary: Negative for dysuria, urgency, frequency, hematuria and flank pain.  Musculoskeletal: Negative for myalgias, back pain, joint pain and falls.  Skin: Negative for itching and rash.  Neurological: Negative for dizziness and weakness. Negative for tingling, tremors, sensory change, speech change, focal weakness, loss of consciousness and headaches.  Endo/Heme/Allergies: Negative for environmental allergies and polydipsia. Does not bruise/bleed easily.  Psychiatric/Behavioral: Negative for suicidal ideas. The patient is not nervous/anxious.      Past Medical History  Diagnosis Date  . DVT (deep venous thrombosis)   . Hypertension   . Diabetes mellitus without complication   . CHF (congestive heart failure)   . GERD (gastroesophageal reflux disease)  History reviewed. No pertinent past surgical history. Social History:  has no tobacco, alcohol, and drug history on file.  Allergies  Allergen Reactions  . Donepezil   . Duloxetine   . Iodine    . Penicillins   . Quinolones   . Sodium Benzoate [Nutritional Supplements]   . Sulfa Antibiotics   . Trimethoprim     Family History: HTN in mother and father   Prior to Admission medications   Medication Sig Start Date End Date Taking? Authorizing Provider  acetaminophen (TYLENOL) 500 MG tablet Take 500 mg by mouth 4 (four) times daily.   Yes Historical Provider, MD  albuterol (PROVENTIL HFA;VENTOLIN HFA) 108 (90 BASE) MCG/ACT inhaler Inhale 1 puff into the lungs 4 (four) times daily as needed for wheezing or shortness of breath.   Yes Historical Provider, MD  albuterol (PROVENTIL) (2.5 MG/3ML) 0.083% nebulizer solution Take 2.5 mg by nebulization 3 (three) times daily.   Yes Historical Provider, MD  Calcium Carb-Cholecalciferol (CALCIUM-VITAMIN D) 600-400 MG-UNIT TABS Take 1 tablet by mouth daily.   Yes Historical Provider, MD  carvedilol (COREG) 6.25 MG tablet Take 6.25 mg by mouth 2 (two) times daily with a meal.   Yes Historical Provider, MD  Cholecalciferol (VITAMIN D3) 5000 UNITS CAPS Take 1 capsule by mouth every other day.   Yes Historical Provider, MD  clonazePAM (KLONOPIN) 0.5 MG tablet Take 0.5 mg by mouth every 12 (twelve) hours as needed for anxiety.   Yes Historical Provider, MD  dextromethorphan (DELSYM) 30 MG/5ML liquid Take 60 mg by mouth 2 (two) times daily as needed for cough.   Yes Historical Provider, MD  divalproex (DEPAKOTE ER) 250 MG 24 hr tablet Take 250 mg by mouth 3 (three) times daily.   Yes Historical Provider, MD  fexofenadine (ALLEGRA) 180 MG tablet Take 180 mg by mouth daily.   Yes Historical Provider, MD  fluticasone (FLONASE) 50 MCG/ACT nasal spray Place 1 spray into both nostrils daily.   Yes Historical Provider, MD  furosemide (LASIX) 20 MG tablet Take 20 mg by mouth daily.   Yes Historical Provider, MD  Glucose Blood (BLOOD GLUCOSE TEST STRIPS) STRP 1 strip by In Vitro route 2 (two) times daily.   Yes Historical Provider, MD  guaiFENesin (ROBITUSSIN)  100 MG/5ML liquid Take 200 mg by mouth every 6 (six) hours as needed for cough.   Yes Historical Provider, MD  haloperidol (HALDOL) 0.5 MG tablet Take 0.5 mg by mouth 3 (three) times daily.   Yes Historical Provider, MD  levothyroxine (SYNTHROID, LEVOTHROID) 175 MCG tablet Take 175 mcg by mouth daily before breakfast.   Yes Historical Provider, MD  loperamide (IMODIUM) 2 MG capsule Take 2 mg by mouth daily as needed for diarrhea or loose stools.   Yes Historical Provider, MD  loratadine (CLARITIN) 10 MG tablet Take 10 mg by mouth daily.   Yes Historical Provider, MD  losartan (COZAAR) 50 MG tablet Take 50 mg by mouth daily.   Yes Historical Provider, MD  metFORMIN (GLUMETZA) 500 MG (MOD) 24 hr tablet Take 500 mg by mouth daily with breakfast.   Yes Historical Provider, MD  neomycin-bacitracin-polymyxin (NEOSPORIN) [phone] ointment Apply 1 application topically 3 (three) times daily as needed (affected areas).   Yes Historical Provider, MD  OLANZapine (ZYPREXA) 10 MG tablet Take 10 mg by mouth daily.   Yes Historical Provider, MD  pantoprazole (PROTONIX) 40 MG tablet Take 40 mg by mouth daily.   Yes Historical Provider, MD  Polyethyl Glycol-Propyl Glycol  0.4-0.3 % GEL Place 1 drop into both eyes 2 (two) times daily.   Yes Historical Provider, MD  potassium chloride SA (K-DUR,KLOR-CON) 20 MEQ tablet Take 20 mEq by mouth daily.   Yes Historical Provider, MD  rivastigmine (EXELON) 4.6 mg/24hr Place 4.6 mg onto the skin daily.   Yes Historical Provider, MD  TOPICALS DOCUMENTATION OPTIME Apply 1 application topically 2 (two) times daily.   Yes Historical Provider, MD  traZODone (DESYREL) 150 MG tablet Take 150 mg by mouth at bedtime.   Yes Historical Provider, MD  vitamin B-12 (CYANOCOBALAMIN) 500 MCG tablet Take 500 mcg by mouth daily.   Yes Historical Provider, MD  warfarin (COUMADIN) 2 MG tablet Take 2 mg by mouth daily. Take along with 5 mg=7mg  daily.   Yes Historical Provider, MD  warfarin  (COUMADIN) 5 MG tablet Take 5 mg by mouth daily. Take along with 2 mg=7 mg daily.   Yes Historical Provider, MD   Physical Exam: Filed Vitals:   01/11/15 1140  BP: 141/60  Pulse: 81  Temp: 97.8 F (36.6 C)  TempSrc: Oral  Resp: 18  SpO2: 94%    Physical Exam  Constitutional: Appears well-developed and well-nourished. No distress.  HENT: Normocephalic. No tonsillar erythema or exudates Eyes: Conjunctivae and EOM are normal. PERRLA, no scleral icterus.  Neck: Normal ROM. Neck supple. No JVD. No tracheal deviation. No thyromegaly.  CVS: RRR, S1/S2 +, no murmurs, no gallops, no carotid bruit.  Pulmonary: Effort and breath sounds normal, no stridor, rhonchi, wheezes, rales.  Abdominal: Soft. BS +,  no distension, tenderness, rebound or guarding.  Musculoskeletal: Normal range of motion. LLE edema and erythema.  Lymphadenopathy: No lymphadenopathy noted, cervical, inguinal. Neuro: Alert. Normal reflexes, muscle tone coordination. No focal neurologic deficits. Skin: Skin is warm and dry. No rash noted.  No erythema. No pallor.  Psychiatric: Normal mood and affect. Behavior, judgment, thought content normal.   Labs on Admission:  Basic Metabolic Panel:  Recent Labs Lab 01/11/15 1348  NA 139  K 4.3  CL 106  CO2 29  GLUCOSE 129*  BUN 14  CREATININE 0.85  CALCIUM 9.0   Liver Function Tests:  Recent Labs Lab 01/11/15 1348  AST 20  ALT 9  ALKPHOS 48  BILITOT 0.4  PROT 6.3  ALBUMIN 3.7   No results for input(s): LIPASE, AMYLASE in the last 168 hours. No results for input(s): AMMONIA in the last 168 hours. CBC:  Recent Labs Lab 01/11/15 1348  WBC 13.8*  NEUTROABS 9.0*  HGB 10.8*  HCT 34.0*  MCV 109.3*  PLT 286   Cardiac Enzymes: No results for input(s): CKTOTAL, CKMB, CKMBINDEX, TROPONINI in the last 168 hours. BNP: Invalid input(s): POCBNP CBG: No results for input(s): GLUCAP in the last 168 hours.  If 7PM-7AM, please contact  night-coverage [email or web address] Password [email or web address] 01/11/2015, 3:37 PM

## 2015-01-11 NOTE — Progress Notes (Signed)
I was called by the ER to see patient.  Please note patient has a second computer chart under [employer] [employer]. Her HGB has increased since the last draw earlier in Feb. She had a colonoscopy in 2010 by Dr Virginia Rochesterrr, and we will try to get that report. Please call us if she continues to bleed or you require a GI consultation.

## 2015-01-11 NOTE — ED Notes (Signed)
[facility] , per pt co hard BM at 5 AM, she reports right red rectal bleeding. HX hemorrhoids and Coumadin intake. HX DVT. Per ems left leg edema, redness and warmness. Pt s alert and oriented x4. Pt is ambulatory with significant assistance, also pt is legally blind.

## 2015-01-12 DIAGNOSIS — L03116 Cellulitis of left lower limb: Secondary | ICD-10-CM

## 2015-01-12 LAB — COMPREHENSIVE METABOLIC PANEL
ALBUMIN: 2.9 g/dL — AB (ref 3.5–5.2)
ALK PHOS: 30 U/L — AB (ref 39–117)
ALT: 9 U/L (ref 0–35)
AST: 16 U/L (ref 0–37)
Anion gap: 6 (ref 5–15)
BUN: 12 mg/dL (ref 6–23)
CHLORIDE: 105 mmol/L (ref 96–112)
CO2: 26 mmol/L (ref 19–32)
Calcium: 8.4 mg/dL (ref 8.4–10.5)
Creatinine, Ser: 0.88 mg/dL (ref 0.50–1.10)
GFR calc non Af Amer: 58 mL/min — ABNORMAL LOW (ref >90)
GFR, EST AFRICAN AMERICAN: 68 mL/min — AB (ref >90)
Glucose, Bld: 121 mg/dL — ABNORMAL HIGH (ref 70–99)
POTASSIUM: 3.6 mmol/L (ref 3.5–5.1)
Sodium: 137 mmol/L (ref 135–145)
Total Bilirubin: 0.7 mg/dL (ref 0.3–1.2)
Total Protein: 5 g/dL — ABNORMAL LOW (ref 6.0–8.3)

## 2015-01-12 LAB — CBC
HCT: 27.4 % — ABNORMAL LOW (ref 36.0–46.0)
Hemoglobin: 8.7 g/dL — ABNORMAL LOW (ref 12.0–15.0)
MCH: 34.9 pg — ABNORMAL HIGH (ref 26.0–34.0)
MCHC: 31.8 g/dL (ref 30.0–36.0)
MCV: 110 fL — AB (ref 78.0–100.0)
Platelets: 228 10*3/uL (ref 150–400)
RBC: 2.49 MIL/uL — ABNORMAL LOW (ref 3.87–5.11)
RDW: 15.2 % (ref 11.5–15.5)
WBC: 7.2 10*3/uL (ref 4.0–10.5)

## 2015-01-12 MED ORDER — STERILE WATER FOR INJECTION IJ SOLN
INTRAMUSCULAR | Status: AC
  Administered 2015-01-12: 10 mL
  Filled 2015-01-12: qty 10

## 2015-01-12 MED ORDER — ALBUTEROL SULFATE (2.5 MG/3ML) 0.083% IN NEBU
2.5000 mg | INHALATION_SOLUTION | Freq: Three times a day (TID) | RESPIRATORY_TRACT | Status: DC
  Administered 2015-01-12 – 2015-01-13 (×3): 2.5 mg via RESPIRATORY_TRACT
  Filled 2015-01-12 (×4): qty 3

## 2015-01-12 NOTE — Progress Notes (Signed)
[facility]. [facility] been [facility]. Pt had been at ALF for only a day prior to hospitalization. CSW will follow to assist with d/c planning needs.  Cori RazorJamie Bracken Moffa LCSW [phone]

## 2015-01-12 NOTE — Progress Notes (Signed)
VASCULAR LAB PRELIMINARY  PRELIMINARY  PRELIMINARY  PRELIMINARY  Left lower extremity venous duplex completed.    Preliminary report: Left - No obvious evidence of acute DVT. There is what appears to be a chronic DVT of the femoral and distal common femoral veins. There is no evidence of a superficial thrombosis or Baker's cyst.  Kajal Scalici, RVS 01/12/2015, 9:43 AM

## 2015-01-12 NOTE — Progress Notes (Addendum)
Patient ID: Paula [name], female   DOB: [birth year], 81 y.o.   MRN: [id number] [facility] PROGRESS NOTE  Paula [name] [id number] DOB: [birth year] DOA: 01/11/2015 PCP: Eustaquio BoydenJavier Gutierrez, MD  Brief narrative:    81 year old female from SNF with past medical history of hypertension, depression, dementia, DVT on anticoagulation with coumadin who [facility] with reports of rectal bleed noted on the day prior to this admission. Pt reported being constipated and straining to have BM and that is when she noticed blood with BM. No reports of abdominal pain, nausea or vomiting.  In ED, pt was hemodynamically stable. Hemoglobin was 10.8 on admission. INR was 1.7.   Assessment/Plan:    Principal Problem: Lower GI bleed / rectal bleed / Acute blood loss anemia - Likely hemorrhoidal bleed considering patient has history of hemorrhoids. Constipation may be contributing factor as well. - Hemoglobin dropped to 8.7 this morning. Patient however did not have rectal bleeding since the admission. - Continue to monitor CBC. - Continue to hold Coumadin. - Continue Protonix 40 mg IV twice daily.  Active Problems: Cellulitis of left lower extremity / Leukocytosis - Continue clindamycin.  DVT (deep venous thrombosis) - Lower extremity Doppler showed no obvious evidence of acute DVT but there appears to be chronic DVT of the femoral and distal common femoral veins. - Anticoagulation on hold because of risk of bleeding.  Diabetes mellitus without complication - Continue metformin  Essential hypertension - Continue Coreg, Lasix and losartan  Hypothyroidism - Continue synthroid  Diastolic CHF, chronic - Continue lasix, coreg, losartan - Compensated  Depression - Continue Haldol and Zyprexa per nursing home regimen.   DVT Prophylaxis  - SCDs bilaterally due to risk of bleeding. Coumadin on hold   Code Status: Full.  Family Communication:  plan of care discussed with the patient, family  not at the bedside Disposition Plan: Continue to monitor hemoglobin. Physical therapy pending.  IV access:  Peripheral IV  Procedures and diagnostic studies:    Lower extremity Doppler 01/12/2015 Preliminary report: Left - No obvious evidence of acute DVT. There is what appears to be a chronic DVT of the femoral and distal common femoral veins. There is no evidence of a superficial thrombosis or Baker's cyst.  Medical Consultants:  None   Other Consultants:  Physical therapy  IAnti-Infectives:   None   Manson PasseyEVINE, Guerino Caporale, MD  [facility] Pager [phone]  If 7PM-7AM, please contact night-coverage [email or web address] Password [email or web address] 01/12/2015, 11:12 AM   LOS: 1 day    HPI/Subjective: No acute overnight events.  Objective: Filed Vitals:   01/11/15 2105 01/12/15 0457 01/12/15 0941 01/12/15 1023  BP: 146/62 115/53  130/66  Pulse: 62 59  66  Temp: 97.9 F (36.6 C) 98 F (36.7 C)    TempSrc: Axillary Oral    Resp: 16 14    Height:      Weight:      SpO2: 100% 100% 97%     Intake/Output Summary (Last 24 hours) at 01/12/15 1112 Last data filed at 01/12/15 0900  Gross per 24 hour  Intake 1689.16 ml  Output    275 ml  Net 1414.16 ml    Exam:   General:  Pt is  not in acute distress  Cardiovascular: Regular rate and rhythm, S1/S2, no murmurs  Respiratory: Clear to auscultation bilaterally, no wheezing, no crackles, no rhonchi  Abdomen: Soft, non tender, non distended, bowel sounds present  Extremities: LE edema improving, pulses DP and PT palpable bilaterally  Neuro: Grossly nonfocal  Data Reviewed: Basic Metabolic Panel:  Recent Labs Lab 01/11/15 1348 01/12/15 0440  NA 139 137  K 4.3 3.6  CL 106 105  CO2 29 26  GLUCOSE 129* 121*  BUN 14 12  CREATININE 0.85 0.88  CALCIUM 9.0 8.4   Liver Function Tests:  Recent Labs Lab 01/11/15 1348 01/12/15 0440  AST 20 16  ALT 9 9  ALKPHOS 48 30*  BILITOT 0.4 0.7  PROT 6.3 5.0*  ALBUMIN 3.7 2.9*   No  results for input(s): LIPASE, AMYLASE in the last 168 hours. No results for input(s): AMMONIA in the last 168 hours. CBC:  Recent Labs Lab 01/11/15 1348 01/12/15 0440  WBC 13.8* 7.2  NEUTROABS 9.0*  --   HGB 10.8* 8.7*  HCT 34.0* 27.4*  MCV 109.3* 110.0*  PLT 286 228   Cardiac Enzymes: No results for input(s): CKTOTAL, CKMB, CKMBINDEX, TROPONINI in the last 168 hours. BNP: Invalid input(s): POCBNP CBG: No results for input(s): GLUCAP in the last 168 hours.  No results found for this or any previous visit (from the past 240 hour(s)).   Scheduled Meds: . acetaminophen  500 mg Oral QID  . albuterol  2.5 mg Nebulization TID  . calcium-vitamin D  1 tablet Oral Q breakfast  . carvedilol  6.25 mg Oral BID WC  . cholecalciferol  5,000 Units Oral QODAY  . clindamycin (CLEOCIN) IV  600 mg Intravenous 3 times per day  . divalproex  250 mg Oral TID  . fluticasone  1 spray Each Nare Daily  . furosemide  20 mg Oral Daily  . haloperidol  0.5 mg Oral TID  . levothyroxine  175 mcg Oral QAC breakfast  . loratadine  10 mg Oral Daily  . losartan  50 mg Oral Daily  . metFORMIN  500 mg Oral Q breakfast  . OLANZapine  10 mg Oral Daily  . pantoprazole (PROTONIX) IV  40 mg Intravenous Q12H  . polyvinyl alcohol  1 drop Both Eyes BID  . potassium chloride SA  20 mEq Oral Daily  . rivastigmine  4.6 mg Transdermal Daily  . traZODone  150 mg Oral QHS  . vitamin B-12  500 mcg Oral Daily   Continuous Infusions: . sodium chloride 1,000 mL (01/12/15 0356)

## 2015-01-13 LAB — CBC
HEMATOCRIT: 30.3 % — AB (ref 36.0–46.0)
Hemoglobin: 9.7 g/dL — ABNORMAL LOW (ref 12.0–15.0)
MCH: 35.1 pg — ABNORMAL HIGH (ref 26.0–34.0)
MCHC: 32 g/dL (ref 30.0–36.0)
MCV: 109.8 fL — ABNORMAL HIGH (ref 78.0–100.0)
PLATELETS: 267 10*3/uL (ref 150–400)
RBC: 2.76 MIL/uL — ABNORMAL LOW (ref 3.87–5.11)
RDW: 15.4 % (ref 11.5–15.5)
WBC: 8.4 10*3/uL (ref 4.0–10.5)

## 2015-01-13 LAB — BASIC METABOLIC PANEL
Anion gap: 7 (ref 5–15)
BUN: 11 mg/dL (ref 6–23)
CO2: 27 mmol/L (ref 19–32)
CREATININE: 0.93 mg/dL (ref 0.50–1.10)
Calcium: 8.9 mg/dL (ref 8.4–10.5)
Chloride: 107 mmol/L (ref 96–112)
GFR calc non Af Amer: 54 mL/min — ABNORMAL LOW (ref >90)
GFR, EST AFRICAN AMERICAN: 63 mL/min — AB (ref >90)
Glucose, Bld: 94 mg/dL (ref 70–99)
Potassium: 4.2 mmol/L (ref 3.5–5.1)
SODIUM: 141 mmol/L (ref 135–145)

## 2015-01-13 LAB — PROTIME-INR
INR: 1.5 — AB (ref 0.00–1.49)
Prothrombin Time: 18.3 seconds — ABNORMAL HIGH (ref 11.6–15.2)

## 2015-01-13 MED ORDER — CLINDAMYCIN HCL 150 MG PO CAPS: 150.0000 mg | ORAL_CAPSULE | Freq: Three times a day (TID) | ORAL | Status: DC

## 2015-01-13 MED ORDER — HALOPERIDOL 0.5 MG PO TABS: 0.5000 mg | ORAL_TABLET | Freq: Three times a day (TID) | ORAL | Status: DC

## 2015-01-13 MED ORDER — CLONAZEPAM 0.5 MG PO TABS: 0.5000 mg | ORAL_TABLET | Freq: Two times a day (BID) | ORAL | Status: DC | PRN

## 2015-01-13 MED ORDER — RIVAROXABAN 20 MG PO TABS: 20.0000 mg | ORAL_TABLET | Freq: Every day | ORAL | Status: DC

## 2015-01-13 MED ORDER — RIVAROXABAN 20 MG PO TABS
20.0000 mg | ORAL_TABLET | Freq: Every day | ORAL | Status: DC
  Filled 2015-01-13: qty 1

## 2015-01-13 MED ORDER — ONDANSETRON HCL 4 MG PO TABS: 4.0000 mg | ORAL_TABLET | Freq: Four times a day (QID) | ORAL | Status: AC | PRN

## 2015-01-13 MED ORDER — TRAZODONE HCL 150 MG PO TABS: 150.0000 mg | ORAL_TABLET | Freq: Every day | ORAL | Status: AC

## 2015-01-13 MED ORDER — OLANZAPINE 10 MG PO TABS: 10.0000 mg | ORAL_TABLET | Freq: Every day | ORAL | Status: DC

## 2015-01-13 NOTE — Progress Notes (Signed)
[facility], report given to [facility]acy RN, patient with stable vitals, husband updated, no in no apparent distress Stanford BreedBracey, Kam Kushnir N RN 1:15 PM 01-13-2015

## 2015-01-13 NOTE — Discharge Summary (Addendum)
Physician Discharge Summary  Paula [name] ZOX:[id number] DOB: 09-03-1927 DOA: 01/11/2015  PCP: Eustaquio BoydenJavier Gutierrez, MD  Admit date: 01/11/2015 Discharge date: 01/13/2015  Recommendations for Outpatient Follow-up:  1. Continue xarelto on discharge for treatment of chronic DVT. Please monitor CBC at least every other week to make sure hemoglobin is stable. 2. Continue clindamycin for 7 days on discharge for treatment of lower extremity cellulitis.  Discharge Diagnoses:  Principal Problem:   GI bleed Active Problems:   Rectal bleed   Acute blood loss anemia   Cellulitis of left lower extremity   DVT (deep venous thrombosis)   Leukocytosis   Diabetes mellitus without complication   HTN (hypertension)   Hypothyroidism   Diastolic CHF, chronic    Discharge Condition: stable   Diet recommendation: as tolerated   History of present illness:  81 year old female from SNF with past medical history of hypertension, depression, dementia, DVT on anticoagulation with coumadin who [facility] with reports of rectal bleed noted on the day prior to this admission. Pt reported being constipated and straining to have BM and that is when she noticed blood with BM. No reports of abdominal pain, nausea or vomiting.  In ED, pt was hemodynamically stable. Hemoglobin was 10.8 on admission. INR was 1.7.   Assessment/Plan:    Principal Problem: Lower GI bleed secondary to internal hemorrhoids unknown stage / rectal bleed / Acute blood loss anemia  - Likely hemorrhoidal bleed considering patient has history of hemorrhoids. Constipation may be contributing factor as well. - No further report of bleeding throughout the hospital stay. - There was a drop in hemoglobin, down to 8.7 but subsequently improved to 9.7 without blood transfusion. - We will restart blood thinner however xarelto instead of Coumadin per patient's family request. - Please continue to monitor hemoglobin at least twice a week for  next month or so to make sure that hemoglobin is stable and patient is not bleeding. - Continue Protonix on discharge.  Active Problems: Cellulitis of left lower extremity / Leukocytosis - Continue clindamycin for 7 days on discharge.  DVT (deep venous thrombosis) - Lower extremity Doppler showed no obvious evidence of acute DVT but there appears to be chronic DVT of the femoral and distal common femoral veins. - Anticoagulation was on hold because of risk of bleeding but we have restarted anticoagulation with xareto today.  Diabetes mellitus without complication - Continue metformin on discharge  Essential hypertension - Continue Coreg, Lasix and losartan on discharge  Hypothyroidism - Continue synthroid on discharge  Diastolic CHF, chronic - Continue lasix, coreg, losartan - Compensated  Depression - Continue Haldol and Zyprexa per nursing home regimen.   DVT Prophylaxis  - SCDs bilaterally in hospital.   Code Status: Full.  Family Communication: plan of care discussed with the patient and her husband at the bedside.   IV access:  Peripheral IV  Procedures and diagnostic studies:   Lower extremity Doppler 01/12/2015 Preliminary report: Left - No obvious evidence of acute DVT. There is what appears to be a chronic DVT of the femoral and distal common femoral veins. There is no evidence of a superficial thrombosis or Baker's cyst.  Medical Consultants:  None   Other Consultants:  Physical therapy  IAnti-Infectives:   None   Signed:  Manson PasseyEVINE, Jimesha Rising, MD  [facility] 01/13/2015, 10:35 AM  Pager #: [phone]   Discharge Exam: Filed Vitals:   01/13/15 0450  BP: 140/57  Pulse: 68  Temp:   Resp: 16  Filed Vitals:   01/12/15 1600 01/12/15 2007 01/12/15 2115 01/13/15 0450  BP: 126/50  132/56 140/57  Pulse: 63  77 68  Temp:   97.9 F (36.6 C)   TempSrc:   Oral   Resp:   16 16  Height:      Weight:      SpO2:  98% 98% 98%     General: Pt is alert, follows commands appropriately, not in acute distress Cardiovascular: Regular rate and rhythm, S1/S2 appreciated Respiratory: Clear to auscultation bilaterally, no wheezing, no crackles, no rhonchi Abdominal: Soft, non tender, non distended, bowel sounds +, no guarding Extremities: Lower extremity cellulitis improving, no cyanosis, pulses palpable bilaterally DP and PT Neuro: Grossly nonfocal  Discharge Instructions  Discharge Instructions    Call MD for:  difficulty breathing, headache or visual disturbances    Complete by:  As directed      Call MD for:  persistant nausea and vomiting    Complete by:  As directed      Call MD for:  redness, tenderness, or signs of infection (pain, swelling, redness, odor or green/yellow discharge around incision site)    Complete by:  As directed      Call MD for:  severe uncontrolled pain    Complete by:  As directed      Diet - low sodium heart healthy    Complete by:  As directed      Discharge instructions    Complete by:  As directed   1. Continue xarelto on discharge for treatment of chronic DVT. Please monitor CBC at least every other week to make sure hemoglobin is stable. 2. Continue clindamycin for 7 days on discharge for treatment of lower extremity cellulitis.     Increase activity slowly    Complete by:  As directed             Medication List    STOP taking these medications        warfarin 2 MG tablet  Commonly known as:  COUMADIN     warfarin 5 MG tablet  Commonly known as:  COUMADIN      TAKE these medications        acetaminophen 500 MG tablet  Commonly known as:  TYLENOL  Take 500 mg by mouth 4 (four) times daily.     albuterol (2.5 MG/3ML) 0.083% nebulizer solution  Commonly known as:  PROVENTIL  Take 2.5 mg by nebulization 3 (three) times daily.     albuterol 108 (90 BASE) MCG/ACT inhaler  Commonly known as:  PROVENTIL HFA;VENTOLIN HFA  Inhale 1 puff into the lungs 4 (four) times  daily as needed for wheezing or shortness of breath.     BLOOD GLUCOSE TEST STRIPS Strp  1 strip by In Vitro route 2 (two) times daily.     Calcium-Vitamin D 600-400 MG-UNIT Tabs  Take 1 tablet by mouth daily.     carvedilol 6.25 MG tablet  Commonly known as:  COREG  Take 6.25 mg by mouth 2 (two) times daily with a meal.     clindamycin 150 MG capsule  Commonly known as:  CLEOCIN  Take 1 capsule (150 mg total) by mouth 3 (three) times daily.     clonazePAM 0.5 MG tablet  Commonly known as:  KLONOPIN  Take 1 tablet (0.5 mg total) by mouth every 12 (twelve) hours as needed for anxiety.     dextromethorphan 30 MG/5ML liquid  Commonly known as:  DELSYM  Take 60 mg by mouth 2 (two) times daily as needed for cough.     divalproex 250 MG 24 hr tablet  Commonly known as:  DEPAKOTE ER  Take 250 mg by mouth 3 (three) times daily.     fexofenadine 180 MG tablet  Commonly known as:  ALLEGRA  Take 180 mg by mouth daily.     fluticasone 50 MCG/ACT nasal spray  Commonly known as:  FLONASE  Place 1 spray into both nostrils daily.     furosemide 20 MG tablet  Commonly known as:  LASIX  Take 20 mg by mouth daily.     guaiFENesin 100 MG/5ML liquid  Commonly known as:  ROBITUSSIN  Take 200 mg by mouth every 6 (six) hours as needed for cough.     haloperidol 0.5 MG tablet  Commonly known as:  HALDOL  Take 1 tablet (0.5 mg total) by mouth 3 (three) times daily.     levothyroxine 175 MCG tablet  Commonly known as:  SYNTHROID, LEVOTHROID  Take 175 mcg by mouth daily before breakfast.     loperamide 2 MG capsule  Commonly known as:  IMODIUM  Take 2 mg by mouth daily as needed for diarrhea or loose stools.     loratadine 10 MG tablet  Commonly known as:  CLARITIN  Take 10 mg by mouth daily.     losartan 50 MG tablet  Commonly known as:  COZAAR  Take 50 mg by mouth daily.     metFORMIN 500 MG (MOD) 24 hr tablet  Commonly known as:  GLUMETZA  Take 500 mg by mouth daily with  breakfast.     neomycin-bacitracin-polymyxin [phone] ointment  Apply 1 application topically 3 (three) times daily as needed (affected areas).     OLANZapine 10 MG tablet  Commonly known as:  ZYPREXA  Take 1 tablet (10 mg total) by mouth daily.     ondansetron 4 MG tablet  Commonly known as:  ZOFRAN  Take 1 tablet (4 mg total) by mouth every 6 (six) hours as needed for nausea.     pantoprazole 40 MG tablet  Commonly known as:  PROTONIX  Take 40 mg by mouth daily.     Polyethyl Glycol-Propyl Glycol 0.4-0.3 % Gel  Place 1 drop into both eyes 2 (two) times daily.     potassium chloride SA 20 MEQ tablet  Commonly known as:  K-DUR,KLOR-CON  Take 20 mEq by mouth daily.     rivaroxaban 20 MG Tabs tablet  Commonly known as:  XARELTO  Take 1 tablet (20 mg total) by mouth daily with supper.     rivastigmine 4.6 mg/24hr  Commonly known as:  EXELON  Place 4.6 mg onto the skin daily.     TOPICALS DOCUMENTATION OPTIME  Apply 1 application topically 2 (two) times daily.     traZODone 150 MG tablet  Commonly known as:  DESYREL  Take 1 tablet (150 mg total) by mouth at bedtime.     vitamin B-12 500 MCG tablet  Commonly known as:  CYANOCOBALAMIN  Take 500 mcg by mouth daily.     Vitamin D3 5000 UNITS Caps  Take 1 capsule by mouth every other day.           Follow-up Information    Follow up with [name], MD. Schedule an appointment as soon as possible for a visit in 1 week.   Specialty:  Family Medicine   Why:  Follow up appt after recent hospitalization  Contact information:   [address] [place] [address] [phone]        The results of significant diagnostics from this hospitalization (including imaging, microbiology, ancillary and laboratory) are listed below for reference.    Significant Diagnostic Studies: No results found.  Microbiology: No results found for this or any previous visit (from the past 240 hour(s)).   Labs: Basic  Metabolic Panel:  Recent Labs Lab 01/11/15 1348 01/12/15 0440 01/13/15 0447  NA 139 137 141  K 4.3 3.6 4.2  CL 106 105 107  CO2 29 26 27   GLUCOSE 129* 121* 94  BUN 14 12 11   CREATININE 0.85 0.88 0.93  CALCIUM 9.0 8.4 8.9   Liver Function Tests:  Recent Labs Lab 01/11/15 1348 01/12/15 0440  AST 20 16  ALT 9 9  ALKPHOS 48 30*  BILITOT 0.4 0.7  PROT 6.3 5.0*  ALBUMIN 3.7 2.9*   No results for input(s): LIPASE, AMYLASE in the last 168 hours. No results for input(s): AMMONIA in the last 168 hours. CBC:  Recent Labs Lab 01/11/15 1348 01/12/15 0440 01/13/15 0447  WBC 13.8* 7.2 8.4  NEUTROABS 9.0*  --   --   HGB 10.8* 8.7* 9.7*  HCT 34.0* 27.4* 30.3*  MCV 109.3* 110.0* 109.8*  PLT 286 228 267   Cardiac Enzymes: No results for input(s): CKTOTAL, CKMB, CKMBINDEX, TROPONINI in the last 168 hours. BNP: BNP (last 3 results) No results for input(s): BNP in the last 8760 hours.  ProBNP (last 3 results) No results for input(s): PROBNP in the last 8760 hours.  CBG: No results for input(s): GLUCAP in the last 168 hours.  Time coordinating discharge: Over 30 minutes

## 2015-01-13 NOTE — Progress Notes (Addendum)
ANTICOAGULATION CONSULT NOTE - Initial Consult  Pharmacy Consult for xarelto Indication: h/o DVT  Allergies  Allergen Reactions  . Donepezil   . Duloxetine   . Iodine   . Penicillins   . Quinolones   . Sodium Benzoate [Nutritional Supplements]   . Sulfa Antibiotics   . Trimethoprim     Patient Measurements: Height: 5\' 5"  (165.1 cm) Weight: 175 lb 4.3 oz (79.5 kg) IBW/kg (Calculated) : 57 Heparin Dosing Weight:   Vital Signs: BP: 140/57 mmHg (03/04 0450) Pulse Rate: 68 (03/04 0450)  Labs:  Recent Labs  01/11/15 1348 01/12/15 0440 01/13/15 0447  HGB 10.8* 8.7* 9.7*  HCT 34.0* 27.4* 30.3*  PLT 286 228 267  APTT 53*  --   --   LABPROT 20.2*  --   --   INR 1.71*  --   --   CREATININE 0.85 0.88 0.93    Estimated Creatinine Clearance: 46.1 mL/min (by C-G formula based on Cr of 0.93).   Medical History: Past Medical History  Diagnosis Date  . DVT (deep venous thrombosis)   . Hypertension   . Diabetes mellitus without complication   . CHF (congestive heart failure)   . GERD (gastroesophageal reflux disease)     Assessment: [age] YOF who presented with rectal bleeding following BM following straining d/t constipation with h/o hemorrhoids.  There is no sign of ongoing bleeding.  She was on warfarin prior to admission.  INR was subtherapeutic at 1.7 on 3/2 (last dose of warfarin given 3/1).  LE dopplers 3/3 reveal no acute clot but evidence of chronic DVT.  Spoke to Dr. Elisabeth Pigeonevine regarding initiation plans, no need to start with 15mg  BID as this is not an acute clot (but was recent per notes, no date).  Discussed level of concern for bleeding and using xarelto, no reversal agent versus resuming warfarin.  Continue with plans to start xarelto as rectal bleeding likely related to constipation/straining/hemorrhoids.   Today, 01/13/2015  CBC: Hgb = 9.7, pltc WNL  Renal: CrCl = 4756ml/min using actual BW (as was used in studies)  Drug interactions: none  Goal of Therapy:   Dose for indication and per physician request   Plan:   Since INR subtherapeutic 3/2 with last dose of warfarin 3/1, start Xarelto 20mg  daily with food, start w/ lunch  Monitor for bleeding  Juliette Alcideustin Keghan Mcfarren, PharmD, BCPS.   Pager: [phone]  01/13/2015,9:40 AM  Addendum:  INR recheck today was 1.5  Plan - as above  Juliette Alcideustin Tatiyanna Lashley, PharmD, BCPS.   Pager: [phone]  01/13/2015 11:29 AM

## 2015-01-13 NOTE — Evaluation (Signed)
Physical Therapy Evaluation Patient Details Name: Paula [name] MRN: [id number] DOB: [birth year] Today's Date: 01/13/2015   History of Present Illness    81 year old female from SNF with past medical history of hypertension, depression, dementia, DVT on anticoagulation with coumadin who presented to [facility] ED with reports of rectal bleed   Clinical Impression  Pt admitted with dx of GIB and presenting with functional mobility limitations 2* generalized weakness, balance deficits and questionable cognition.  Pt would benefit from follow up rehab at SNF level to maximize IND and safety prior to possible return to ALF.    Follow Up Recommendations SNF    Equipment Recommendations       Recommendations for Other Services OT consult     Precautions / Restrictions Precautions Precautions: Fall Precaution Comments: Retropulsive Restrictions Weight Bearing Restrictions: No Other Position/Activity Restrictions: WBAT      Mobility  Bed Mobility Overal bed mobility: +2 for physical assistance;Needs Assistance Bed Mobility: Supine to Sit     Supine to sit: Mod assist;+2 for physical assistance     General bed mobility comments: cues for log roll technique and physical assist to manage LEs and to bring trunk to upright  Transfers Overall transfer level: Needs assistance Equipment used: Rolling walker (2 wheeled) Transfers: Sit to/from Stand Sit to Stand: +2 physical assistance;Mod assist Stand pivot transfers: Mod assist;+2 physical assistance;+2 safety/equipment       General transfer comment: cues for transition position and use of UEs to self assist  Ambulation/Gait Ambulation/Gait assistance: Mod assist;+2 physical assistance;+2 safety/equipment Ambulation Distance (Feet): 2 Feet Assistive device: Rolling walker (2 wheeled) Gait Pattern/deviations: Step-to pattern;Decreased step length - right;Decreased step length - left;Shuffle;Trunk flexed Gait velocity: decr   General Gait  Details: cues for posture, wt shift fwd over feet, positon from RW and increased UE WB - physical assist for balance, support and wt shift fwd  Stairs            Wheelchair Mobility    Modified Rankin (Stroke Patients Only)       Balance Overall balance assessment: Needs assistance Sitting-balance support: Bilateral upper extremity supported Sitting balance-Leahy Scale: Poor   Postural control: Posterior lean Standing balance support: Bilateral upper extremity supported Standing balance-Leahy Scale: Zero Standing balance comment: post lean                             Pertinent Vitals/Pain Pain Assessment: No/denies pain    Home Living Family/patient expects to be discharged to:: Skilled nursing facility                      Prior Function Level of Independence: Needs assistance         Comments: Husband states pt ambulated 76' at [place] just prior to this hospital admit     Hand Dominance        Extremity/Trunk Assessment   Upper Extremity Assessment: Generalized weakness           Lower Extremity Assessment: Generalized weakness;LLE deficits/detail   LLE Deficits / Details: knee flex ltd to 40 2* discomfort; notable edema - husband reports recent DVT  Cervical / Trunk Assessment: Kyphotic  Communication   Communication: HOH  Cognition Arousal/Alertness: Awake/alert Behavior During Therapy: Flat affect Overall Cognitive Status: Within Functional Limits for tasks assessed                      General Comments  Exercises        Assessment/Plan    PT Assessment Patient needs continued PT services  PT Diagnosis Difficulty walking;Generalized weakness;Altered mental status   PT Problem List Decreased strength;Decreased range of motion  PT Treatment Interventions DME instruction;Gait training;Functional mobility training;Therapeutic exercise;Patient/family education   PT Goals (Current goals can be found  in the Care Plan section) Acute Rehab PT Goals Patient Stated Goal: I need to get up PT Goal Formulation: With patient/family Time For Goal Achievement: 01/27/15 Potential to Achieve Goals: Fair    Frequency Min 3X/week   Barriers to discharge        Co-evaluation               End of Session Equipment Utilized During Treatment: Gait belt Activity Tolerance: Patient limited by fatigue Patient left: in chair;with call bell/phone within reach;with nursing/sitter in room Nurse Communication: Mobility status         Time: [id number] PT Time Calculation (min) (ACUTE ONLY): 42 min   Charges:   PT Evaluation $Initial PT Evaluation Tier I: 1 Procedure PT Treatments $Gait Training: 8-22 mins $Therapeutic Activity: 8-22 mins   PT G Codes:        Saket Hellstrom 01/13/2015, 1:06 PM

## 2015-01-13 NOTE — Discharge Instructions (Addendum)
Cellulitis Cellulitis is an infection of the skin and the tissue beneath it. The infected area is usually red and tender. Cellulitis occurs most often in the arms and lower legs.  CAUSES  Cellulitis is caused by bacteria that enter the skin through cracks or cuts in the skin. The most common types of bacteria that cause cellulitis are staphylococci and streptococci. SIGNS AND SYMPTOMS   Redness and warmth.  Swelling.  Tenderness or pain.  Fever. DIAGNOSIS  Your health care provider can usually determine what is wrong based on a physical exam. Blood tests may also be done. TREATMENT  Treatment usually involves taking an antibiotic medicine. HOME CARE INSTRUCTIONS   Take your antibiotic medicine as directed by your health care provider. Finish the antibiotic even if you start to feel better.  Keep the infected arm or leg elevated to reduce swelling.  Apply a warm cloth to the affected area up to 4 times per day to relieve pain.  Take medicines only as directed by your health care provider.  Keep all follow-up visits as directed by your health care provider. SEEK MEDICAL CARE IF:   You notice red streaks coming from the infected area.  Your red area gets larger or turns dark in color.  Your bone or joint underneath the infected area becomes painful after the skin has healed.  Your infection returns in the same area or another area.  You notice a swollen bump in the infected area.  You develop new symptoms.  You have a fever. SEEK IMMEDIATE MEDICAL CARE IF:   You feel very sleepy.  You develop vomiting or diarrhea.  You have a general ill feeling (malaise) with muscle aches and pains. MAKE SURE YOU:   Understand these instructions.  Will watch your condition.  Will get help right away if you are not doing well or get worse. Document Released: 08/07/2005 Document Revised: 03/14/2014 Document Reviewed: 01/13/2012 [facility] Patient Information 2015 [employer] [place].  This information is not intended to replace advice given to you by your health care provider. Make sure you discuss any questions you have with your health care provider.   Information on my medicine - XARELTO (rivaroxaban)  This medication education was reviewed with me or my healthcare representative as part of my discharge preparation.  The pharmacist that spoke with me during my hospital stay was:  Dannielle HuhZeigler, Ilona Colley George, [facility] Care[facility]PH  WHY WAS Carlena HurlXARELTO PRESCRIBED FOR YOU? Xarelto was prescribed to treat blood clots that may have been found in the veins of your legs (deep vein thrombosis) or in your lungs (pulmonary embolism) and to reduce the risk of them occurring again.  What do you need to know about Xarelto? Take one 20 mg tablet ONCE A DAY with your evening meal.  DO NOT stop taking Xarelto without talking to the health care provider who prescribed the medication.  Refill your prescription for 20 mg tablets before you run out.  After discharge, you should have regular check-up appointments with your healthcare provider that is prescribing your Xarelto.  In the future your dose may need to be changed if your kidney function changes by a significant amount.  What do you do if you miss a dose? If you are taking Xarelto TWICE DAILY and you miss a dose, take it as soon as you remember. You may take two 15 mg tablets (total 30 mg) at the same time then resume your regularly scheduled 15 mg twice daily the next day.  If you are taking  Xarelto ONCE DAILY and you miss a dose, take it as soon as you remember on the same day then continue your regularly scheduled once daily regimen the next day. Do not take two doses of Xarelto at the same time.   Important Safety Information Xarelto is a blood thinner medicine that can cause bleeding. You should call your healthcare provider right away if you experience any of the following: ? Bleeding from an injury or your nose that does not stop. ? Unusual  colored urine (red or dark brown) or unusual colored stools (red or black). ? Unusual bruising for unknown reasons. ? A serious fall or if you hit your head (even if there is no bleeding).  Some medicines may interact with Xarelto and might increase your risk of bleeding while on Xarelto. To help avoid this, consult your healthcare provider or pharmacist prior to using any new prescription or non-prescription medications, including herbals, vitamins, non-steroidal anti-inflammatory drugs (NSAIDs) and supplements.  This website has more information on Xarelto: VisitDestination.com.br.

## 2015-01-14 NOTE — Progress Notes (Signed)
Clinical Social Work Department BRIEF PSYCHOSOCIAL ASSESSMENT 01/14/2015  Patient:  Paula [name], Paula [name]     Account Number:  [id number]     Admit date:  01/11/2015  Clinical Social Worker:  Lacie Scotts  Date/Time:  01/12/2015 12:10 PM  Referred by:  Physician  Date Referred:  01/12/2015 Referred for  ALF Placement   Other Referral:   Interview type:  Family Other interview type:    PSYCHOSOCIAL DATA Living Status:  FACILITY Admitted from facility:  [facility] Level of care:  Assisted Living Primary support name:  Paula [name] Primary support relationship to patient:  SPOUSE Degree of support available:   supportive    CURRENT CONCERNS  Other Concerns:    SOCIAL WORK ASSESSMENT / PLAN Pt is an 81 yr old female admitted from [facility] ). Pt had recently been at  for rehab. She had only been at ALF for a day prior to requiring hospitalization. CSW reviewed chart, contacted ALF, met with pt and spoke to spouse to assist with d/c planning. ALF shared concerns regarding managing pt's coumadin. ALF would prefer pt not return with this med.ALF asked if pt could be readmitted to [facility] until med is no longer needed. Spouse requested to speak with MD to see if med can be changed prior to returning to ALF. ( Nsg to contact MD and request spouse be contacted ). PT eval is pending. D/c disposition is unclear ( ALF vs SNF ). CSW will continue to follow to assist with d/c planning needs.   Assessment/plan status:  Psychosocial Support/Ongoing Assessment of Needs Other assessment/ plan:   Information/referral to community resources:   SNF vs ALF placement reviewed.    PATIENT'S/FAMILY'S RESPONSE TO PLAN OF CARE: " I don't feel that bad. My leg hurts little. " Pt doesn't recall if she wa s admitted from ALF or SNF. " You can talk to my husband. He knows. " Pt's mood is calm. Spouse will meet with CSW during his visit this  afternoon. He is concerned about his wife's health. Support provided.  Werner Lean LCSW [phone]

## 2015-01-14 NOTE — Progress Notes (Signed)
[facility] transport required. Spouse aware out of pocket cost may be associated with this type of transport. PT recommended SNF placement. ALF requested pt return to SNF prior to being admitted back to ALF. [facility]. Pt still in 30 day window to return to SNF without requiring 3 over night hospital stay to use medicare for placement. [facility]. Spouse was is agreement with d/c plan for pt to return to SNF.  Cori RazorJamie Dejanae Helser LCSW [phone]

## 2015-01-17 DIAGNOSIS — F0390 Unspecified dementia without behavioral disturbance: Secondary | ICD-10-CM | POA: Diagnosis not present

## 2015-01-17 DIAGNOSIS — I87002 Postthrombotic syndrome without complications of left lower extremity: Secondary | ICD-10-CM

## 2015-01-17 DIAGNOSIS — M6281 Muscle weakness (generalized): Secondary | ICD-10-CM

## 2015-01-17 DIAGNOSIS — I82402 Acute embolism and thrombosis of unspecified deep veins of left lower extremity: Secondary | ICD-10-CM

## 2015-01-31 ENCOUNTER — Encounter (HOSPITAL_COMMUNITY): Payer: Self-pay | Admitting: Emergency Medicine

## 2015-02-13 ENCOUNTER — Other Ambulatory Visit: Payer: Self-pay | Admitting: Family Medicine

## 2015-03-05 ENCOUNTER — Emergency Department (HOSPITAL_COMMUNITY): Payer: Medicare Other

## 2015-03-05 ENCOUNTER — Encounter (HOSPITAL_COMMUNITY): Payer: Self-pay | Admitting: Emergency Medicine

## 2015-03-05 ENCOUNTER — Emergency Department (HOSPITAL_COMMUNITY)
Admission: EM | Admit: 2015-03-05 | Discharge: 2015-03-05 | Disposition: A | Discharge status: Home / Self Care | Payer: Medicare Other | Attending: Emergency Medicine | Admitting: Emergency Medicine

## 2015-03-05 DIAGNOSIS — S0003XA Contusion of scalp, initial encounter: Secondary | ICD-10-CM | POA: Insufficient documentation

## 2015-03-05 DIAGNOSIS — K219 Gastro-esophageal reflux disease without esophagitis: Secondary | ICD-10-CM | POA: Diagnosis not present

## 2015-03-05 DIAGNOSIS — Z79899 Other long term (current) drug therapy: Secondary | ICD-10-CM | POA: Diagnosis not present

## 2015-03-05 DIAGNOSIS — E119 Type 2 diabetes mellitus without complications: Secondary | ICD-10-CM | POA: Insufficient documentation

## 2015-03-05 DIAGNOSIS — Z9889 Other specified postprocedural states: Secondary | ICD-10-CM | POA: Diagnosis not present

## 2015-03-05 DIAGNOSIS — Y9289 Other specified places as the place of occurrence of the external cause: Secondary | ICD-10-CM | POA: Insufficient documentation

## 2015-03-05 DIAGNOSIS — Y9389 Activity, other specified: Secondary | ICD-10-CM | POA: Insufficient documentation

## 2015-03-05 DIAGNOSIS — IMO0001 Reserved for inherently not codable concepts without codable children: Secondary | ICD-10-CM

## 2015-03-05 DIAGNOSIS — J441 Chronic obstructive pulmonary disease with (acute) exacerbation: Secondary | ICD-10-CM | POA: Insufficient documentation

## 2015-03-05 DIAGNOSIS — I509 Heart failure, unspecified: Secondary | ICD-10-CM | POA: Insufficient documentation

## 2015-03-05 DIAGNOSIS — J209 Acute bronchitis, unspecified: Secondary | ICD-10-CM | POA: Insufficient documentation

## 2015-03-05 DIAGNOSIS — Z7951 Long term (current) use of inhaled steroids: Secondary | ICD-10-CM | POA: Diagnosis not present

## 2015-03-05 DIAGNOSIS — N390 Urinary tract infection, site not specified: Secondary | ICD-10-CM | POA: Insufficient documentation

## 2015-03-05 DIAGNOSIS — F0390 Unspecified dementia without behavioral disturbance: Secondary | ICD-10-CM | POA: Insufficient documentation

## 2015-03-05 DIAGNOSIS — Z86718 Personal history of other venous thrombosis and embolism: Secondary | ICD-10-CM | POA: Diagnosis not present

## 2015-03-05 DIAGNOSIS — Z880 Allergy status to penicillin: Secondary | ICD-10-CM | POA: Diagnosis not present

## 2015-03-05 DIAGNOSIS — E039 Hypothyroidism, unspecified: Secondary | ICD-10-CM | POA: Diagnosis not present

## 2015-03-05 DIAGNOSIS — H548 Legal blindness, as defined in USA: Secondary | ICD-10-CM | POA: Diagnosis not present

## 2015-03-05 DIAGNOSIS — I10 Essential (primary) hypertension: Secondary | ICD-10-CM | POA: Diagnosis not present

## 2015-03-05 DIAGNOSIS — F329 Major depressive disorder, single episode, unspecified: Secondary | ICD-10-CM | POA: Diagnosis not present

## 2015-03-05 DIAGNOSIS — Z87891 Personal history of nicotine dependence: Secondary | ICD-10-CM | POA: Diagnosis not present

## 2015-03-05 DIAGNOSIS — J42 Unspecified chronic bronchitis: Secondary | ICD-10-CM

## 2015-03-05 DIAGNOSIS — Y998 Other external cause status: Secondary | ICD-10-CM | POA: Insufficient documentation

## 2015-03-05 DIAGNOSIS — F419 Anxiety disorder, unspecified: Secondary | ICD-10-CM | POA: Diagnosis not present

## 2015-03-05 DIAGNOSIS — Z7902 Long term (current) use of antithrombotics/antiplatelets: Secondary | ICD-10-CM | POA: Diagnosis not present

## 2015-03-05 DIAGNOSIS — R05 Cough: Secondary | ICD-10-CM | POA: Diagnosis present

## 2015-03-05 DIAGNOSIS — W19XXXA Unspecified fall, initial encounter: Secondary | ICD-10-CM | POA: Diagnosis not present

## 2015-03-05 LAB — URINALYSIS, ROUTINE W REFLEX MICROSCOPIC
Bilirubin Urine: NEGATIVE
Glucose, UA: NEGATIVE mg/dL
HGB URINE DIPSTICK: NEGATIVE
NITRITE: POSITIVE — AB
PH: 6 (ref 5.0–8.0)
SPECIFIC GRAVITY, URINE: 1.027 (ref 1.005–1.030)
Urobilinogen, UA: 0.2 mg/dL (ref 0.0–1.0)

## 2015-03-05 LAB — I-STAT CHEM 8, ED
BUN: 13 mg/dL (ref 6–23)
Calcium, Ion: 1.28 mmol/L (ref 1.13–1.30)
Chloride: 104 mmol/L (ref 96–112)
Creatinine, Ser: 0.9 mg/dL (ref 0.50–1.10)
GLUCOSE: 113 mg/dL — AB (ref 70–99)
HEMATOCRIT: 34 % — AB (ref 36.0–46.0)
Hemoglobin: 11.6 g/dL — ABNORMAL LOW (ref 12.0–15.0)
Potassium: 3.8 mmol/L (ref 3.5–5.1)
Sodium: 143 mmol/L (ref 135–145)
TCO2: 27 mmol/L (ref 0–100)

## 2015-03-05 LAB — CBC WITH DIFFERENTIAL/PLATELET
BASOS PCT: 0 % (ref 0–1)
Basophils Absolute: 0 10*3/uL (ref 0.0–0.1)
EOS ABS: 0.1 10*3/uL (ref 0.0–0.7)
Eosinophils Relative: 1 % (ref 0–5)
HEMATOCRIT: 33.4 % — AB (ref 36.0–46.0)
Hemoglobin: 10.4 g/dL — ABNORMAL LOW (ref 12.0–15.0)
LYMPHS ABS: 2.4 10*3/uL (ref 0.7–4.0)
LYMPHS PCT: 29 % (ref 12–46)
MCH: 33.3 pg (ref 26.0–34.0)
MCHC: 31.1 g/dL (ref 30.0–36.0)
MCV: 107.1 fL — ABNORMAL HIGH (ref 78.0–100.0)
Monocytes Absolute: 0.8 10*3/uL (ref 0.1–1.0)
Monocytes Relative: 9 % (ref 3–12)
NEUTROS PCT: 61 % (ref 43–77)
Neutro Abs: 5.2 10*3/uL (ref 1.7–7.7)
PLATELETS: 274 10*3/uL (ref 150–400)
RBC: 3.12 MIL/uL — ABNORMAL LOW (ref 3.87–5.11)
RDW: 16 % — ABNORMAL HIGH (ref 11.5–15.5)
WBC: 8.5 10*3/uL (ref 4.0–10.5)

## 2015-03-05 LAB — I-STAT TROPONIN, ED: Troponin i, poc: 0 ng/mL (ref 0.00–0.08)

## 2015-03-05 LAB — BRAIN NATRIURETIC PEPTIDE: B Natriuretic Peptide: 39.3 pg/mL (ref 0.0–100.0)

## 2015-03-05 LAB — URINE MICROSCOPIC-ADD ON

## 2015-03-05 MED ORDER — ALBUTEROL SULFATE (2.5 MG/3ML) 0.083% IN NEBU
5.0000 mg | INHALATION_SOLUTION | Freq: Once | RESPIRATORY_TRACT | Status: AC
  Administered 2015-03-05: 5 mg via RESPIRATORY_TRACT
  Filled 2015-03-05 (×2): qty 6

## 2015-03-05 MED ORDER — IPRATROPIUM BROMIDE 0.02 % IN SOLN
0.5000 mg | Freq: Once | RESPIRATORY_TRACT | Status: AC
  Administered 2015-03-05: 0.5 mg via RESPIRATORY_TRACT
  Filled 2015-03-05: qty 2.5

## 2015-03-05 MED ORDER — CEFUROXIME AXETIL 250 MG PO TABS: 250.0000 mg | ORAL_TABLET | Freq: Two times a day (BID) | ORAL | Status: DC

## 2015-03-05 MED ORDER — PREDNISONE 20 MG PO TABS
40.0000 mg | ORAL_TABLET | Freq: Once | ORAL | Status: AC
  Administered 2015-03-05: 40 mg via ORAL
  Filled 2015-03-05: qty 2

## 2015-03-05 MED ORDER — DEXTROSE 5 % IV SOLN
1.0000 g | Freq: Once | INTRAVENOUS | Status: AC
  Administered 2015-03-05: 1 g via INTRAVENOUS
  Filled 2015-03-05: qty 10

## 2015-03-05 MED ORDER — IPRATROPIUM-ALBUTEROL 0.5-2.5 (3) MG/3ML IN SOLN
3.0000 mL | Freq: Once | RESPIRATORY_TRACT | Status: AC
  Administered 2015-03-05: 3 mL via RESPIRATORY_TRACT
  Filled 2015-03-05: qty 3

## 2015-03-05 MED ORDER — DOXYCYCLINE HYCLATE 100 MG PO TABS
100.0000 mg | ORAL_TABLET | Freq: Once | ORAL | Status: DC
Start: 2015-03-05 — End: 2015-03-05

## 2015-03-05 MED ORDER — PREDNISONE 20 MG PO TABS: 40.0000 mg | ORAL_TABLET | Freq: Every day | ORAL | Status: DC

## 2015-03-05 NOTE — ED Notes (Signed)
Bed: WA06 Expected date:  Expected time:  Means of arrival:  Comments: 

## 2015-03-05 NOTE — ED Notes (Signed)
Patient transported to CT 

## 2015-03-05 NOTE — ED Provider Notes (Signed)
CSN: [id number]     Arrival date & time 03/05/15  0454 History   First MD Initiated Contact with Patient 03/05/15 [phone]     Chief Complaint  Patient presents with  . Fall  . Cough     (Consider location/radiation/quality/duration/timing/severity/associated sxs/prior Treatment) Patient is a 81 y.o. female presenting with fall and cough. The history is provided by the patient.  Fall Associated symptoms include shortness of breath. Pertinent negatives include no chest pain, no abdominal pain and no headaches.  Cough Associated symptoms: shortness of breath and wheezing   Associated symptoms: no chest pain, no chills, no fever, no headaches, no rash and no sore throat   Patient w hx copd, dvt on xarelto, presents from ecf s/p fall early this morning.  Pt unsure what caused fall.  Pt uses wheelchair, and ambulates minimally w walker at baseline. Pt was found on floor next to bed.  Hit head/right side of face. Pt unsure if loc, and is unsure if just rolled out of bed. Pt/family also note in past 2 weeks increased non prod cough and wheezing. Family notes hx recurrent bronchitis/chronic bronchitis. No fever or chills. No sore throat, runny nose, body aches, or other uri c/o. No headache. No neck or back pain. No numbness/weakness. No chest pain or discomfort. Mild sob. bil lower ext edema, pt/fam unsure if worse than baseline. No abd pain. No nvd. No gu c/o.      Past Medical History  Diagnosis Date  . Depression   . Ejection fraction     EF 40%, echo, November, 2012  / in improved, EF 60%, echo, December, 2012  . Contrast media allergy     Patient feels poorly with contrast  . Status post AAA (abdominal aortic aneurysm) repair     Surgical repair, Dr. Hart Rochester, December 27, 2011  . Pre-syncope     vasovagal w/ heart block  . Parotid mass     has refused further eval 10/2011  . Anxiety   . Facial tic     L sided spasms - Botox trial summer 2013, ?effective  . ARMD (age related macular  degeneration) 06/2014    bilateral vision loss - legally blind  . AAA (abdominal aortic aneurysm)     s/p repair 12/2011  . Congestive heart failure     NL EF 10/2011 echo   . Diabetes mellitus type II, controlled   . Hypothyroidism   . Sinus bradycardia 09/19/2011    Occurring simultaneously with complete heart block   . COPD (chronic obstructive pulmonary disease)   . Facial spasm     L hemifacial-treated w/ botox xeomin -Dr Terrace Arabia  . Dementia     MRI with progressive chronic microvascular ischemia and generalized cerebral atrophy  . Lumbosacral spondylosis without myelopathy   . Legally blind 06/2014    due to ARMD  . DVT (deep venous thrombosis)   . Hypertension   . Diabetes mellitus without complication   . CHF (congestive heart failure)   . GERD (gastroesophageal reflux disease)    Past Surgical History  Procedure Laterality Date  . Cholecystectomy    . Knee cartilage surgery      left  . Sinus surgery with instatrak    . Appendectomy    . Oophorectomy      ovarian cyst  . Cataract extraction      bilateral  . Bladder repair    . US echocardiography  10/14/11  . Abdominal aortic aneurysm repair  12/27/2011  ANEURYSM ABDOMINAL AORTIC REPAIR;  Surgeon: Josephina Gip, MD; Resection and Grafting Abdominal Aortic Aneurysm , Aorta Bi Iliac.  Marland Kitchen Pacemaker insertion  11/12  . Cardiac catheterization  11/04/11  . Temporary pacemaker insertion N/A 09/19/2011    Procedure: TEMPORARY PACEMAKER INSERTION;  Surgeon: Dolores Patty, MD;  Location: [facility] CATH LAB;  Service: Cardiovascular;  Laterality: N/A;  . Left heart catheterization with coronary angiogram N/A 11/04/2011    Procedure: LEFT HEART CATHETERIZATION WITH CORONARY ANGIOGRAM;  Surgeon: Laurey Morale, MD;  Location: [facility] CATH LAB;  Service: Cardiovascular;  Laterality: N/A;   Family History  Problem Relation Age of Onset  . Esophageal cancer Father   . Cancer Father   . Breast cancer Sister   . Cancer Sister   . Colon  cancer Sister   . Heart disease Sister   . Heart disease Mother   . Heart disease Son   . Parkinson's disease Sister     younger   History  Substance Use Topics  . Smoking status: Former Smoker -- 1.00 packs/day for 60 years    Types: Cigarettes    Quit date: 01/11/2011  . Smokeless tobacco: Never Used  . Alcohol Use: No   OB History    Gravida Para Term Preterm AB TAB SAB Ectopic Multiple Living   0 0 0 0 0 0 0 0       Review of Systems  Constitutional: Negative for fever and chills.  HENT: Negative for sore throat.   Eyes: Negative for redness.  Respiratory: Positive for cough, shortness of breath and wheezing.   Cardiovascular: Negative for chest pain.  Gastrointestinal: Negative for vomiting, abdominal pain and diarrhea.  Endocrine: Negative for polyuria.  Genitourinary: Negative for dysuria and flank pain.  Musculoskeletal: Negative for back pain and neck pain.  Skin: Negative for rash.  Neurological: Negative for weakness, numbness and headaches.  Hematological: Does not bruise/bleed easily.  Psychiatric/Behavioral: The patient is not nervous/anxious.       Allergies  Bactrim; Contrast media; Iodine; Iohexol; Sulfa antibiotics; Aricept; Cymbalta; Delsym; Guaifenesin & derivatives; Hydromet; Donepezil; Duloxetine; Iodine; Penicillins; Quinolones; Sodium benzoate; Sodium benzoate; Sulfa antibiotics; Trimethoprim; Trimethoprim; Avelox; Ciprofloxacin; Gadolinium derivatives; Penicillins; and Quinolones  Home Medications   Prior to Admission medications   Medication Sig Start Date End Date Taking? Authorizing Provider  acetaminophen (TYLENOL) 500 MG tablet Take 500 mg by mouth every 6 (six) hours as needed (pain).   Yes Historical Provider, MD  Calcium Carb-Cholecalciferol (CALCIUM-VITAMIN D) 600-400 MG-UNIT TABS Take 1 tablet by mouth daily.   Yes Historical Provider, MD  carvedilol (COREG) 6.25 MG tablet Take 6.25 mg by mouth 2 (two) times daily with a meal.   Yes  Historical Provider, MD  Cholecalciferol (VITAMIN D3) 5000 UNITS CAPS Take 5,000 Units by mouth every other day.    Yes Historical Provider, MD  clonazePAM (KLONOPIN) 0.5 MG tablet Take 1 tablet (0.5 mg total) by mouth every 12 (twelve) hours as needed for anxiety. 01/13/15  Yes Alison Murray, MD  divalproex (DEPAKOTE) 250 MG DR tablet Take 250 mg by mouth 3 (three) times daily.   Yes Historical Provider, MD  fexofenadine (ALLEGRA) 180 MG tablet Take 180 mg by mouth daily.   Yes Historical Provider, MD  fluticasone (FLONASE) 50 MCG/ACT nasal spray Place 1 spray into both nostrils daily. 04/15/14  Yes [name], MD  furosemide (LASIX) 20 MG tablet Take 20 mg by mouth daily.   Yes Historical Provider, MD  haloperidol (HALDOL) 0.5  MG tablet Take 1 tablet (0.5 mg total) by mouth 3 (three) times daily. 01/13/15  Yes Alison Murray, MD  levothyroxine (SYNTHROID, LEVOTHROID) 175 MCG tablet Take 175 mcg by mouth daily before breakfast.   Yes Historical Provider, MD  loratadine (CLARITIN) 10 MG tablet Take 10 mg by mouth daily.   Yes Historical Provider, MD  losartan (COZAAR) 50 MG tablet Take 50 mg by mouth daily.   Yes Historical Provider, MD  metFORMIN (GLUCOPHAGE) 500 MG tablet Take 500 mg by mouth daily with breakfast.   Yes Historical Provider, MD  OLANZapine (ZYPREXA) 10 MG tablet Take 1 tablet (10 mg total) by mouth daily. 01/13/15  Yes Alison Murray, MD  pantoprazole (PROTONIX) 40 MG tablet Take 40 mg by mouth daily.   Yes Historical Provider, MD  Polyethyl Glycol-Propyl Glycol (SYSTANE) 0.4-0.3 % SOLN Apply 1 drop to eye 2 (two) times daily.    Yes Historical Provider, MD  potassium chloride SA (K-DUR,KLOR-CON) 20 MEQ tablet Take 20 mEq by mouth daily.   Yes Historical Provider, MD  rivaroxaban (XARELTO) 20 MG TABS tablet Take 1 tablet (20 mg total) by mouth daily with supper. 01/13/15  Yes Alison Murray, MD  rivastigmine (EXELON) 4.6 mg/24hr Place 4.6 mg onto the skin every morning.   Yes Historical  Provider, MD  traZODone (DESYREL) 150 MG tablet Take 1 tablet (150 mg total) by mouth at bedtime. 01/13/15  Yes Alison Murray, MD  albuterol (PROVENTIL HFA;VENTOLIN HFA) 108 (90 BASE) MCG/ACT inhaler Inhale 2 puffs into the lungs every 6 (six) hours as needed for wheezing or shortness of breath. 03/24/14   Newt Lukes, MD  albuterol (PROVENTIL) (2.5 MG/3ML) 0.083% nebulizer solution Take 2.5 mg by nebulization every 8 (eight) hours as needed for wheezing.     Historical Provider, MD  clindamycin (CLEOCIN) 150 MG capsule Take 1 capsule (150 mg total) by mouth 3 (three) times daily. Patient not taking: Reported on 03/05/2015 01/13/15   Alison Murray, MD  dextromethorphan (DELSYM) 30 MG/5ML liquid Take 10 mLs by mouth at bedtime as needed for cough.    Historical Provider, MD  enoxaparin (LOVENOX) 80 MG/0.8ML injection Inject 0.8 mLs (80 mg total) into the skin every 12 (twelve) hours. Patient not taking: Reported on 03/05/2015 12/20/14   Penny Pia, MD  Glucose Blood (BLOOD GLUCOSE TEST STRIPS) STRP 1 strip by In Vitro route 2 (two) times daily.    Historical Provider, MD  guaifenesin (ROBITUSSIN) 100 MG/5ML syrup Take 200 mg by mouth every 6 (six) hours as needed for cough.    Historical Provider, MD  ipratropium-albuterol (DUONEB) 0.5-2.5 (3) MG/3ML SOLN Take 3 mLs by nebulization 3 (three) times daily. Patient not taking: Reported on 03/05/2015 08/18/14   Vassie Loll, MD  loperamide (IMODIUM) 2 MG capsule Take 2 mg by mouth daily as needed for diarrhea or loose stools.    Historical Provider, MD  magnesium hydroxide (MILK OF MAGNESIA) 400 MG/5ML suspension Take 30 mLs by mouth daily as needed for mild constipation.    Historical Provider, MD  Neomycin-Bacitracin-Polymyxin (TRIPLE ANTIBIOTIC EX) Apply 1 application topically 3 (three) times daily as needed (minor skin tears or abrasions.).     Historical Provider, MD  ondansetron (ZOFRAN) 4 MG tablet Take 1 tablet (4 mg total) by mouth every 6 (six)  hours as needed for nausea. 01/13/15   Alison Murray, MD  warfarin (COUMADIN) 6 MG tablet Take 1 tablet (6 mg total) by mouth one time only  at 6 PM. Patient not taking: Reported on 03/05/2015 12/20/14   Penny Pia, MD   BP 155/72 mmHg  Pulse 77  Temp(Src) 98.2 F (36.8 C) (Oral)  SpO2 100% Physical Exam  Constitutional: She appears well-developed and well-nourished. No distress.  HENT:  Mild contusion scalp  Eyes: Conjunctivae and EOM are normal. Pupils are equal, round, and reactive to light. No scleral icterus.  Neck: Normal range of motion. Neck supple. No tracheal deviation present.  No bruit  Cardiovascular: Normal rate, regular rhythm, normal heart sounds and intact distal pulses.   Pulmonary/Chest: Effort normal. No respiratory distress. She exhibits no tenderness.  Upper resp congestion, non prod cough, rhonchi.   Abdominal: Soft. Normal appearance and bowel sounds are normal. She exhibits no distension and no mass. There is no tenderness. There is no rebound and no guarding.  Genitourinary:  No cva tenderness  Musculoskeletal: She exhibits edema. She exhibits no tenderness.  Symmetric mild-mod bil lower ext edema.   Neurological: She is alert. No cranial nerve deficit.  Awake and alert. Speech clear/fluent. Mildly confused/poor historian. Family indicates mental status c/w baseline. Pt moves bil ext purposefully w good strength.   Skin: Skin is warm and dry. No rash noted.  Psychiatric: She has a normal mood and affect.  Nursing note and vitals reviewed.   ED Course  Procedures (including critical care time) Labs Review   Results for orders placed or performed during the hospital encounter of 03/05/15  Urinalysis, Routine w reflex microscopic  Result Value Ref Range   Color, Urine YELLOW YELLOW   APPearance CLOUDY (A) CLEAR   Specific Gravity, Urine 1.027 1.005 - 1.030   pH 6.0 5.0 - 8.0   Glucose, UA NEGATIVE NEGATIVE mg/dL   Hgb urine dipstick NEGATIVE NEGATIVE    Bilirubin Urine NEGATIVE NEGATIVE   Ketones, ur TRACE (A) NEGATIVE mg/dL   Protein, ur TRACE (A) NEGATIVE mg/dL   Urobilinogen, UA 0.2 0.0 - 1.0 mg/dL   Nitrite POSITIVE (A) NEGATIVE   Leukocytes, UA TRACE (A) NEGATIVE  CBC with Differential  Result Value Ref Range   WBC 8.5 4.0 - 10.5 K/uL   RBC 3.12 (L) 3.87 - 5.11 MIL/uL   Hemoglobin 10.4 (L) 12.0 - 15.0 g/dL   HCT 16.1 (L) 09.6 - 04.5 %   MCV 107.1 (H) 78.0 - 100.0 fL   MCH 33.3 26.0 - 34.0 pg   MCHC 31.1 30.0 - 36.0 g/dL   RDW 40.9 (H) 81.1 - 91.4 %   Platelets 274 150 - 400 K/uL   Neutrophils Relative % 61 43 - 77 %   Neutro Abs 5.2 1.7 - 7.7 K/uL   Lymphocytes Relative 29 12 - 46 %   Lymphs Abs 2.4 0.7 - 4.0 K/uL   Monocytes Relative 9 3 - 12 %   Monocytes Absolute 0.8 0.1 - 1.0 K/uL   Eosinophils Relative 1 0 - 5 %   Eosinophils Absolute 0.1 0.0 - 0.7 K/uL   Basophils Relative 0 0 - 1 %   Basophils Absolute 0.0 0.0 - 0.1 K/uL  Brain natriuretic peptide  Result Value Ref Range   B Natriuretic Peptide 39.3 0.0 - 100.0 pg/mL  Urine microscopic-add on  Result Value Ref Range   Squamous Epithelial / LPF RARE RARE   WBC, UA 3-6 <3 WBC/hpf   Bacteria, UA MANY (A) RARE  I-Stat Chem 8, ED  Result Value Ref Range   Sodium 143 135 - 145 mmol/L   Potassium 3.8 3.5 -  5.1 mmol/L   Chloride 104 96 - 112 mmol/L   BUN 13 6 - 23 mg/dL   Creatinine, Ser 1.610.90 0.50 - 1.10 mg/dL   Glucose, Bld 096113 (H) 70 - 99 mg/dL   Calcium, Ion 0.451.28 4.091.13 - 1.30 mmol/L   TCO2 27 0 - 100 mmol/L   Hemoglobin 11.6 (L) 12.0 - 15.0 g/dL   HCT 81.134.0 (L) 91.436.0 - 78.246.0 %  I-stat troponin, ED  Result Value Ref Range   Troponin i, poc 0.00 0.00 - 0.08 ng/mL   Comment 3           Dg Chest 2 View  03/05/2015   CLINICAL DATA:  Status post unwitnessed fall. Found on floor. Concern for chest injury. Initial encounter.  EXAM: CHEST  2 VIEW  COMPARISON:  Chest radiograph and CT of the chest performed 10/15/2014  FINDINGS: The lungs are well-aerated. A small left  pleural effusion is noted. Mildly increased interstitial markings may be transient in nature. There is no evidence of focal opacification, pleural effusion or pneumothorax.  The heart is enlarged. Mild vascular congestion is noted. No acute osseous abnormalities are seen.  IMPRESSION: Mild vascular congestion and cardiomegaly noted. Small left pleural effusion seen. Mildly interstitial markings may be transient in nature. No displaced rib fracture seen.   Electronically Signed   By: Roanna RaiderJeffery  Chang M.D.   On: 03/05/2015 06:42   Ct Head Wo Contrast  03/05/2015   CLINICAL DATA:  Right facial pain following a fall at 4 a.m. today.  EXAM: CT HEAD WITHOUT CONTRAST  TECHNIQUE: Contiguous axial images were obtained from the base of the skull through the vertex without intravenous contrast.  COMPARISON:  10/15/2014.  FINDINGS: Diffusely enlarged ventricles and subarachnoid spaces. Patchy white matter low density in both cerebral hemispheres. No skull fracture or intracranial hemorrhage. Mucosal thickening involving all of the paranasal sinuses with air-fluid levels noted in the frontal, ethmoid and sphenoid sinuses.  IMPRESSION: 1. No skull fracture or intracranial hemorrhage. 2. Stable atrophy and chronic small vessel white matter ischemic changes. 3. Chronic and acute sinusitis.   Electronically Signed   By: Beckie SaltsSteven  Reid M.D.   On: 03/05/2015 08:25        EKG Interpretation   Date/Time:  Sunday March 05 2015 07:59:30 EDT Ventricular Rate:  67 PR Interval:  261 QRS Duration: 76 QT Interval:  423 QTC Calculation: 446 R Axis:   61 Text Interpretation:  Sinus rhythm Prolonged PR interval Baseline wander  Confirmed by Marsela Kuan  MD, Ridhi Hoffert ([id number]) on 03/05/2015 8:23:52 AM      MDM   Iv ns. o2 [place]. Continuous pulse ox and monitor.   Albuterol neb tx.  Cxr. Labs.  Reviewed nursing notes and prior charts for additional history.   Given fall, on xarelto, contusion head, will get ct.   Ct head  neg.  Recheck spine nt.  Persistent wheezing, mild. Albuterol and atrovent neb. Pred.  uti on labs.  Will culture and rx. Rocephin iv.  Will give abx rx at home. Pred/alb re copd.   Recheck, pt feels improved. Breathing comfortably. Afeb. Tolerating po fluids.   Recheck hr 74, rr 16, pulse ox 97%. Pt has eaten, feels improved.   Currently stable for d/c. Return precautions provided.    Cathren LaineKevin Michaeljohn Biss, MD 03/05/15 1031

## 2015-03-05 NOTE — ED Notes (Signed)
RT notified of breathing treatment orders

## 2015-03-05 NOTE — ED Notes (Signed)
MD at bedside. 

## 2015-03-05 NOTE — ED Notes (Signed)
Pt to CT

## 2015-03-05 NOTE — ED Notes (Signed)
Bed: EA54WA05 Expected date:  Expected time:  Means of arrival:  Comments: EMS Fall 88y

## 2015-03-05 NOTE — ED Notes (Signed)
Patient transported to X-ray 

## 2015-03-05 NOTE — Progress Notes (Signed)
[facility] status of patient level of care post ED discharge. ED CM reviewed record no change noted on LOC in patients record, attempted to notify facility with this information unable to reach anyone at facility. Will make a 2nd attempt.

## 2015-03-05 NOTE — ED Notes (Signed)
[facility] EMS , [facility] reported of unwitnessed fall at around 4am today, pt. Was found on the floor next to her bed on her right side. Pt. Alert and oriented x2. Claimed of pain on right side of her face, skin intact. Pt. Received breathing treatment on board EMS.

## 2015-03-05 NOTE — ED Notes (Signed)
Patient ok to eat, patient and family notified. Dr Denton LankSteinl requested to bedside to update patient and family on test results.

## 2015-03-05 NOTE — Discharge Instructions (Signed)
It was our pleasure to provide your ER care today - we hope that you feel better.  Take antibiotic (ceftin) as prescribed.  Take prednisone as prescribed.   Use albuterol treatment as need.  Follow up with your primary care doctor in the next 1-2 days for recheck - call office tomorrow to arrange follow up/recheck.  Return to ER if worse, new symptoms, breathing worsens, weak/fainting, high fevers, persistent vomiting, other concern.    Chronic Obstructive Pulmonary Disease Chronic obstructive pulmonary disease (COPD) is a common lung condition in which airflow from the lungs is limited. COPD is a general term that can be used to describe many different lung problems that limit airflow, including both chronic bronchitis and emphysema. If you have COPD, your lung function will probably never return to normal, but there are measures you can take to improve lung function and make yourself feel better.  CAUSES   Smoking (common).   Exposure to secondhand smoke.   Genetic problems.  Chronic inflammatory lung diseases or recurrent infections. SYMPTOMS   Shortness of breath, especially with physical activity.   Deep, persistent (chronic) cough with a large amount of thick mucus.   Wheezing.   Rapid breaths (tachypnea).   Gray or bluish discoloration (cyanosis) of the skin, especially in fingers, toes, or lips.   Fatigue.   Weight loss.   Frequent infections or episodes when breathing symptoms become much worse (exacerbations).   Chest tightness. DIAGNOSIS  Your health care provider will take a medical history and perform a physical examination to make the initial diagnosis. Additional tests for COPD may include:   Lung (pulmonary) function tests.  Chest X-ray.  CT scan.  Blood tests. TREATMENT  Treatment available to help you feel better when you have COPD includes:   Inhaler and nebulizer medicines. These help manage the symptoms of COPD and make your  breathing more comfortable.  Supplemental oxygen. Supplemental oxygen is only helpful if you have a low oxygen level in your blood.   Exercise and physical activity. These are beneficial for nearly all people with COPD. Some people may also benefit from a pulmonary rehabilitation program. HOME CARE INSTRUCTIONS   Take all medicines (inhaled or pills) as directed by your health care provider.  Avoid over-the-counter medicines or cough syrups that dry up your airway (such as antihistamines) and slow down the elimination of secretions unless instructed otherwise by your health care provider.   If you are a smoker, the most important thing that you can do is stop smoking. Continuing to smoke will cause further lung damage and breathing trouble. Ask your health care provider for help with quitting smoking. He or she can direct you to community resources or hospitals that provide support.  Avoid exposure to irritants such as smoke, chemicals, and fumes that aggravate your breathing.  Use oxygen therapy and pulmonary rehabilitation if directed by your health care provider. If you require home oxygen therapy, ask your health care provider whether you should purchase a pulse oximeter to measure your oxygen level at home.   Avoid contact with individuals who have a contagious illness.  Avoid extreme temperature and humidity changes.  Eat healthy foods. Eating smaller, more frequent meals and resting before meals may help you maintain your strength.  Stay active, but balance activity with periods of rest. Exercise and physical activity will help you maintain your ability to do things you want to do.  Preventing infection and hospitalization is very important when you have COPD. Make sure  to receive all the vaccines your health care provider recommends, especially the pneumococcal and influenza vaccines. Ask your health care provider whether you need a pneumonia vaccine.  Learn and use relaxation  techniques to manage stress.  Learn and use controlled breathing techniques as directed by your health care provider. Controlled breathing techniques include:   Pursed lip breathing. Start by breathing in (inhaling) through your nose for 1 second. Then, purse your lips as if you were going to whistle and breathe out (exhale) through the pursed lips for 2 seconds.   Diaphragmatic breathing. Start by putting one hand on your abdomen just above your waist. Inhale slowly through your nose. The hand on your abdomen should move out. Then purse your lips and exhale slowly. You should be able to feel the hand on your abdomen moving in as you exhale.   Learn and use controlled coughing to clear mucus from your lungs. Controlled coughing is a series of short, progressive coughs. The steps of controlled coughing are:  1. Lean your head slightly forward.  2. Breathe in deeply using diaphragmatic breathing.  3. Try to hold your breath for 3 seconds.  4. Keep your mouth slightly open while coughing twice.  5. Spit any mucus out into a tissue.  6. Rest and repeat the steps once or twice as needed. SEEK MEDICAL CARE IF:   You are coughing up more mucus than usual.   There is a change in the color or thickness of your mucus.   Your breathing is more labored than usual.   Your breathing is faster than usual.  SEEK IMMEDIATE MEDICAL CARE IF:   You have shortness of breath while you are resting.   You have shortness of breath that prevents you from:  Being able to talk.   Performing your usual physical activities.   You have chest pain lasting longer than 5 minutes.   Your skin color is more cyanotic than usual.  You measure low oxygen saturations for longer than 5 minutes with a pulse oximeter. MAKE SURE YOU:   Understand these instructions.  Will watch your condition.  Will get help right away if you are not doing well or get worse. Document Released: 08/07/2005 Document  Revised: 03/14/2014 Document Reviewed: 06/24/2013 [facility] Patient Information 2015 [place], [place]. This information is not intended to replace advice given to you by your health care provider. Make sure you discuss any questions you have with your health care provider.    Acute Bronchitis Bronchitis is inflammation of the airways that extend from the windpipe into the lungs (bronchi). The inflammation often causes mucus to develop. This leads to a cough, which is the most common symptom of bronchitis.  In acute bronchitis, the condition usually develops suddenly and goes away over time, usually in a couple weeks. Smoking, allergies, and asthma can make bronchitis worse. Repeated episodes of bronchitis may cause further lung problems.  CAUSES Acute bronchitis is most often caused by the same virus that causes a cold. The virus can spread from person to person (contagious) through coughing, sneezing, and touching contaminated objects. SIGNS AND SYMPTOMS   Cough.   Fever.   Coughing up mucus.   Body aches.   Chest congestion.   Chills.   Shortness of breath.   Sore throat.  DIAGNOSIS  Acute bronchitis is usually diagnosed through a physical exam. Your health care provider will also ask you questions about your medical history. Tests, such as chest X-rays, are sometimes done to rule  out other conditions.  TREATMENT  Acute bronchitis usually goes away in a couple weeks. Oftentimes, no medical treatment is necessary. Medicines are sometimes given for relief of fever or cough. Antibiotic medicines are usually not needed but may be prescribed in certain situations. In some cases, an inhaler may be recommended to help reduce shortness of breath and control the cough. A cool mist vaporizer may also be used to help thin bronchial secretions and make it easier to clear the chest.  HOME CARE INSTRUCTIONS  Get plenty of rest.   Drink enough fluids to keep your urine clear or pale  yellow (unless you have a medical condition that requires fluid restriction). Increasing fluids may help thin your respiratory secretions (sputum) and reduce chest congestion, and it will prevent dehydration.   Take medicines only as directed by your health care provider.  If you were prescribed an antibiotic medicine, finish it all even if you start to feel better.  Avoid smoking and secondhand smoke. Exposure to cigarette smoke or irritating chemicals will make bronchitis worse. If you are a smoker, consider using nicotine gum or skin patches to help control withdrawal symptoms. Quitting smoking will help your lungs heal faster.   Reduce the chances of another bout of acute bronchitis by washing your hands frequently, avoiding people with cold symptoms, and trying not to touch your hands to your mouth, nose, or eyes.   Keep all follow-up visits as directed by your health care provider.  SEEK MEDICAL CARE IF: Your symptoms do not improve after 1 week of treatment.  SEEK IMMEDIATE MEDICAL CARE IF:  You develop an increased fever or chills.   You have chest pain.   You have severe shortness of breath.  You have bloody sputum.   You develop dehydration.  You faint or repeatedly feel like you are going to pass out.  You develop repeated vomiting.  You develop a severe headache. MAKE SURE YOU:   Understand these instructions.  Will watch your condition.  Will get help right away if you are not doing well or get worse. Document Released: 12/05/2004 Document Revised: 03/14/2014 Document Reviewed: 04/20/2013 [facility] Patient Information 2015 [place], [place]. This information is not intended to replace advice given to you by your health care provider. Make sure you discuss any questions you have with your health care provider.    Urinary Tract Infection Urinary tract infections (UTIs) can develop anywhere along your urinary tract. Your urinary tract is your body's drainage  system for removing wastes and extra water. Your urinary tract includes two kidneys, two ureters, a bladder, and a urethra. Your kidneys are a pair of bean-shaped organs. Each kidney is about the size of your fist. They are located below your ribs, one on each side of your spine. CAUSES Infections are caused by microbes, which are microscopic organisms, including fungi, viruses, and bacteria. These organisms are so small that they can only be seen through a microscope. Bacteria are the microbes that most commonly cause UTIs. SYMPTOMS  Symptoms of UTIs may vary by age and gender of the patient and by the location of the infection. Symptoms in young women typically include a frequent and intense urge to urinate and a painful, burning feeling in the bladder or urethra during urination. Older women and men are more likely to be tired, shaky, and weak and have muscle aches and abdominal pain. A fever may mean the infection is in your kidneys. Other symptoms of a kidney infection include pain in  your back or sides below the ribs, nausea, and vomiting. DIAGNOSIS To diagnose a UTI, your caregiver will ask you about your symptoms. Your caregiver also will ask to provide a urine sample. The urine sample will be tested for bacteria and white blood cells. White blood cells are made by your body to help fight infection. TREATMENT  Typically, UTIs can be treated with medication. Because most UTIs are caused by a bacterial infection, they usually can be treated with the use of antibiotics. The choice of antibiotic and length of treatment depend on your symptoms and the type of bacteria causing your infection. HOME CARE INSTRUCTIONS  If you were prescribed antibiotics, take them exactly as your caregiver instructs you. Finish the medication even if you feel better after you have only taken some of the medication.  Drink enough water and fluids to keep your urine clear or pale yellow.  Avoid caffeine, tea, and  carbonated beverages. They tend to irritate your bladder.  Empty your bladder often. Avoid holding urine for long periods of time.  Empty your bladder before and after sexual intercourse.  After a bowel movement, women should cleanse from front to back. Use each tissue only once. SEEK MEDICAL CARE IF:   You have back pain.  You develop a fever.  Your symptoms do not begin to resolve within 3 days. SEEK IMMEDIATE MEDICAL CARE IF:   You have severe back pain or lower abdominal pain.  You develop chills.  You have nausea or vomiting.  You have continued burning or discomfort with urination. MAKE SURE YOU:   Understand these instructions.  Will watch your condition.  Will get help right away if you are not doing well or get worse. Document Released: 08/07/2005 Document Revised: 04/28/2012 Document Reviewed: 12/06/2011 [facility] Patient Information 2015 [place], [place]. This information is not intended to replace advice given to you by your health care provider. Make sure you discuss any questions you have with your health care provider.

## 2015-03-27 ENCOUNTER — Emergency Department (HOSPITAL_COMMUNITY): Payer: Medicare Other

## 2015-03-27 ENCOUNTER — Emergency Department (HOSPITAL_COMMUNITY)
Admission: EM | Admit: 2015-03-27 | Discharge: 2015-03-27 | Disposition: A | Discharge status: Home / Self Care | Payer: Medicare Other | Attending: Emergency Medicine | Admitting: Emergency Medicine

## 2015-03-27 ENCOUNTER — Encounter (HOSPITAL_COMMUNITY): Payer: Self-pay | Admitting: Emergency Medicine

## 2015-03-27 DIAGNOSIS — Z7952 Long term (current) use of systemic steroids: Secondary | ICD-10-CM | POA: Diagnosis not present

## 2015-03-27 DIAGNOSIS — F0390 Unspecified dementia without behavioral disturbance: Secondary | ICD-10-CM | POA: Diagnosis not present

## 2015-03-27 DIAGNOSIS — H548 Legal blindness, as defined in USA: Secondary | ICD-10-CM | POA: Diagnosis not present

## 2015-03-27 DIAGNOSIS — J449 Chronic obstructive pulmonary disease, unspecified: Secondary | ICD-10-CM | POA: Diagnosis not present

## 2015-03-27 DIAGNOSIS — Z7901 Long term (current) use of anticoagulants: Secondary | ICD-10-CM | POA: Diagnosis not present

## 2015-03-27 DIAGNOSIS — I509 Heart failure, unspecified: Secondary | ICD-10-CM | POA: Diagnosis not present

## 2015-03-27 DIAGNOSIS — M25562 Pain in left knee: Secondary | ICD-10-CM

## 2015-03-27 DIAGNOSIS — S8002XA Contusion of left knee, initial encounter: Secondary | ICD-10-CM | POA: Diagnosis not present

## 2015-03-27 DIAGNOSIS — K219 Gastro-esophageal reflux disease without esophagitis: Secondary | ICD-10-CM | POA: Diagnosis not present

## 2015-03-27 DIAGNOSIS — Y9389 Activity, other specified: Secondary | ICD-10-CM | POA: Diagnosis not present

## 2015-03-27 DIAGNOSIS — Z792 Long term (current) use of antibiotics: Secondary | ICD-10-CM | POA: Insufficient documentation

## 2015-03-27 DIAGNOSIS — Z87891 Personal history of nicotine dependence: Secondary | ICD-10-CM | POA: Diagnosis not present

## 2015-03-27 DIAGNOSIS — F419 Anxiety disorder, unspecified: Secondary | ICD-10-CM | POA: Insufficient documentation

## 2015-03-27 DIAGNOSIS — R609 Edema, unspecified: Secondary | ICD-10-CM

## 2015-03-27 DIAGNOSIS — M25561 Pain in right knee: Secondary | ICD-10-CM

## 2015-03-27 DIAGNOSIS — Z880 Allergy status to penicillin: Secondary | ICD-10-CM | POA: Insufficient documentation

## 2015-03-27 DIAGNOSIS — S80211A Abrasion, right knee, initial encounter: Secondary | ICD-10-CM | POA: Insufficient documentation

## 2015-03-27 DIAGNOSIS — Z79899 Other long term (current) drug therapy: Secondary | ICD-10-CM | POA: Diagnosis not present

## 2015-03-27 DIAGNOSIS — Z8739 Personal history of other diseases of the musculoskeletal system and connective tissue: Secondary | ICD-10-CM | POA: Insufficient documentation

## 2015-03-27 DIAGNOSIS — Z9889 Other specified postprocedural states: Secondary | ICD-10-CM | POA: Insufficient documentation

## 2015-03-27 DIAGNOSIS — Z86718 Personal history of other venous thrombosis and embolism: Secondary | ICD-10-CM | POA: Insufficient documentation

## 2015-03-27 DIAGNOSIS — S8992XA Unspecified injury of left lower leg, initial encounter: Secondary | ICD-10-CM | POA: Diagnosis present

## 2015-03-27 DIAGNOSIS — E119 Type 2 diabetes mellitus without complications: Secondary | ICD-10-CM | POA: Insufficient documentation

## 2015-03-27 DIAGNOSIS — Z7951 Long term (current) use of inhaled steroids: Secondary | ICD-10-CM | POA: Insufficient documentation

## 2015-03-27 DIAGNOSIS — E039 Hypothyroidism, unspecified: Secondary | ICD-10-CM | POA: Insufficient documentation

## 2015-03-27 DIAGNOSIS — M79601 Pain in right arm: Secondary | ICD-10-CM

## 2015-03-27 DIAGNOSIS — S4991XA Unspecified injury of right shoulder and upper arm, initial encounter: Secondary | ICD-10-CM | POA: Insufficient documentation

## 2015-03-27 DIAGNOSIS — Y998 Other external cause status: Secondary | ICD-10-CM | POA: Diagnosis not present

## 2015-03-27 DIAGNOSIS — I10 Essential (primary) hypertension: Secondary | ICD-10-CM | POA: Insufficient documentation

## 2015-03-27 DIAGNOSIS — W19XXXA Unspecified fall, initial encounter: Secondary | ICD-10-CM

## 2015-03-27 DIAGNOSIS — Y9289 Other specified places as the place of occurrence of the external cause: Secondary | ICD-10-CM | POA: Diagnosis not present

## 2015-03-27 DIAGNOSIS — T148XXA Other injury of unspecified body region, initial encounter: Secondary | ICD-10-CM

## 2015-03-27 DIAGNOSIS — F329 Major depressive disorder, single episode, unspecified: Secondary | ICD-10-CM | POA: Insufficient documentation

## 2015-03-27 DIAGNOSIS — W1839XA Other fall on same level, initial encounter: Secondary | ICD-10-CM | POA: Diagnosis not present

## 2015-03-27 LAB — COMPREHENSIVE METABOLIC PANEL
ALBUMIN: 3.4 g/dL — AB (ref 3.5–5.0)
ALT: 10 U/L — ABNORMAL LOW (ref 14–54)
ANION GAP: 9 (ref 5–15)
AST: 18 U/L (ref 15–41)
Alkaline Phosphatase: 46 U/L (ref 38–126)
BUN: 7 mg/dL (ref 6–20)
CHLORIDE: 106 mmol/L (ref 101–111)
CO2: 28 mmol/L (ref 22–32)
CREATININE: 0.7 mg/dL (ref 0.44–1.00)
Calcium: 9.4 mg/dL (ref 8.9–10.3)
GFR calc Af Amer: >60 mL/min (ref >60)
GFR calc non Af Amer: >60 mL/min (ref >60)
GLUCOSE: 101 mg/dL — AB (ref 65–99)
POTASSIUM: 3.6 mmol/L (ref 3.5–5.1)
Sodium: 143 mmol/L (ref 135–145)
Total Bilirubin: 0.6 mg/dL (ref 0.3–1.2)
Total Protein: 5.6 g/dL — ABNORMAL LOW (ref 6.5–8.1)

## 2015-03-27 LAB — CBC WITH DIFFERENTIAL/PLATELET
Basophils Absolute: 0 10*3/uL (ref 0.0–0.1)
Basophils Relative: 0 % (ref 0–1)
Eosinophils Absolute: 0.2 10*3/uL (ref 0.0–0.7)
Eosinophils Relative: 2 % (ref 0–5)
HEMATOCRIT: 31.7 % — AB (ref 36.0–46.0)
HEMOGLOBIN: 9.9 g/dL — AB (ref 12.0–15.0)
LYMPHS ABS: 2.5 10*3/uL (ref 0.7–4.0)
LYMPHS PCT: 19 % (ref 12–46)
MCH: 31 pg (ref 26.0–34.0)
MCHC: 31.2 g/dL (ref 30.0–36.0)
MCV: 99.4 fL (ref 78.0–100.0)
MONO ABS: 0.9 10*3/uL (ref 0.1–1.0)
Monocytes Relative: 7 % (ref 3–12)
Neutro Abs: 9.7 10*3/uL — ABNORMAL HIGH (ref 1.7–7.7)
Neutrophils Relative %: 72 % (ref 43–77)
Platelets: 316 10*3/uL (ref 150–400)
RBC: 3.19 MIL/uL — AB (ref 3.87–5.11)
RDW: 17.2 % — AB (ref 11.5–15.5)
WBC: 13.3 10*3/uL — ABNORMAL HIGH (ref 4.0–10.5)

## 2015-03-27 LAB — URINALYSIS, ROUTINE W REFLEX MICROSCOPIC
BILIRUBIN URINE: NEGATIVE
Glucose, UA: NEGATIVE mg/dL
HGB URINE DIPSTICK: NEGATIVE
Ketones, ur: NEGATIVE mg/dL
Leukocytes, UA: NEGATIVE
Nitrite: NEGATIVE
PH: 7.5 (ref 5.0–8.0)
Protein, ur: NEGATIVE mg/dL
SPECIFIC GRAVITY, URINE: 1.015 (ref 1.005–1.030)
Urobilinogen, UA: 1 mg/dL (ref 0.0–1.0)

## 2015-03-27 NOTE — ED Notes (Signed)
[facility], found in her room on the floor on her abdomen, hands up by her face with her head on a pillow. Pt states "pain all over" and hand pain. SNF unsure if she fell. No blood thinners. Hx alzheimer's/dementia.

## 2015-03-27 NOTE — Discharge Instructions (Signed)
Use tylenol or motrin to the areas of soreness. Use ice to the areas of soreness to help with pain. Your labs showed slightly elevated white blood cell count, you will need to have your labs rechecked by your regular doctor in the next week. Continue your home medications. Return to the ER for changes or worsening symptoms   Contusion A contusion is a deep bruise. Contusions are the result of an injury that caused bleeding under the skin. The contusion may turn blue, purple, or yellow. Minor injuries will give you a painless contusion, but more severe contusions may stay painful and swollen for a few weeks.  CAUSES  A contusion is usually caused by a blow, trauma, or direct force to an area of the body. SYMPTOMS   Swelling and redness of the injured area.  Bruising of the injured area.  Tenderness and soreness of the injured area.  Pain. DIAGNOSIS  The diagnosis can be made by taking a history and physical exam. An X-ray, CT scan, or MRI may be needed to determine if there were any associated injuries, such as fractures. TREATMENT  Specific treatment will depend on what area of the body was injured. In general, the best treatment for a contusion is resting, icing, elevating, and applying cold compresses to the injured area. Over-the-counter medicines may also be recommended for pain control. Ask your caregiver what the best treatment is for your contusion. HOME CARE INSTRUCTIONS   Put ice on the injured area.  Put ice in a plastic bag.  Place a towel between your skin and the bag.  Leave the ice on for 15-20 minutes, 3-4 times a day, or as directed by your health care provider.  Only take over-the-counter or prescription medicines for pain, discomfort, or fever as directed by your caregiver. Your caregiver may recommend avoiding anti-inflammatory medicines (aspirin, ibuprofen, and naproxen) for 48 hours because these medicines may increase bruising.  Rest the injured area.  If  possible, elevate the injured area to reduce swelling. SEEK IMMEDIATE MEDICAL CARE IF:   You have increased bruising or swelling.  You have pain that is getting worse.  Your swelling or pain is not relieved with medicines. MAKE SURE YOU:   Understand these instructions.  Will watch your condition.  Will get help right away if you are not doing well or get worse. Document Released: 08/07/2005 Document Revised: 11/02/2013 Document Reviewed: 09/02/2011 [facility] Patient Information 2015 [place], [place]. This information is not intended to replace advice given to you by your health care provider. Make sure you discuss any questions you have with your health care provider.  Cryotherapy Cryotherapy is when you put ice on your injury. Ice helps lessen pain and puffiness (swelling) after an injury. Ice works the best when you start using it in the first 24 to 48 hours after an injury. HOME CARE  Put a dry or damp towel between the ice pack and your skin.  You may press gently on the ice pack.  Leave the ice on for no more than 10 to 20 minutes at a time.  Check your skin after 5 minutes to make sure your skin is okay.  Rest at least 20 minutes between ice pack uses.  Stop using ice when your skin loses feeling (numbness).  Do not use ice on someone who cannot tell you when it hurts. This includes small children and people with memory problems (dementia). GET HELP RIGHT AWAY IF:  You have white spots on your skin.  Your skin turns blue or pale.  Your skin feels waxy or hard.  Your puffiness gets worse. MAKE SURE YOU:   Understand these instructions.  Will watch your condition.  Will get help right away if you are not doing well or get worse. Document Released: 04/15/2008 Document Revised: 01/20/2012 Document Reviewed: 06/20/2011 [facility] Patient Information 2015 [place], [place]. This information is not intended to replace advice given to you by your health care provider.  Make sure you discuss any questions you have with your health care provider.   Abrasions An abrasion is a cut or scrape of the skin. Abrasions do not go through all layers of the skin. HOME CARE  If a bandage (dressing) was put on your wound, change it as told by your doctor. If the bandage sticks, soak it off with warm.  Wash the area with water and soap 2 times a day. Rinse off the soap. Pat the area dry with a clean towel.  Put on medicated cream (ointment) as told by your doctor.  Change your bandage right away if it gets wet or dirty.  Only take medicine as told by your doctor.  See your doctor within 24-48 hours to get your wound checked.  Check your wound for redness, puffiness (swelling), or yellowish-white fluid (pus). GET HELP RIGHT AWAY IF:   You have more pain in the wound.  You have redness, swelling, or tenderness around the wound.  You have pus coming from the wound.  You have a fever or lasting symptoms for more than 2-3 days.  You have a fever and your symptoms suddenly get worse.  You have a bad smell coming from the wound or bandage. MAKE SURE YOU:   Understand these instructions.  Will watch your condition.  Will get help right away if you are not doing well or get worse. Document Released: 04/15/2008 Document Revised: 07/22/2012 Document Reviewed: 10/01/2011 [facility] Patient Information 2015 [place], [place]. This information is not intended to replace advice given to you by your health care provider. Make sure you discuss any questions you have with your health care provider.  Fall Prevention and Home Safety Falls cause injuries and can affect all age groups. It is possible to prevent falls.  HOW TO PREVENT FALLS  Wear shoes with rubber soles that do not have an opening for your toes.  Keep the inside and outside of your house well lit.  Use night lights throughout your home.  Remove clutter from floors.  Clean up floor spills.  Remove  throw rugs or fasten them to the floor with carpet tape.  Do not place electrical cords across pathways.  Put grab bars by your tub, shower, and toilet. Do not use towel bars as grab bars.  Put handrails on both sides of the stairway. Fix loose handrails.  Do not climb on stools or stepladders, if possible.  Do not wax your floors.  Repair uneven or unsafe sidewalks, walkways, or stairs.  Keep items you use a lot within reach.  Be aware of pets.  Keep emergency numbers next to the telephone.  Put smoke detectors in your home and near bedrooms. Ask your doctor what other things you can do to prevent falls. Document Released: 08/24/2009 Document Revised: 04/28/2012 Document Reviewed: 01/28/2012 [facility] Patient Information 2015 [place], [place]. This information is not intended to replace advice given to you by your health care provider. Make sure you discuss any questions you have with your health care provider.  Musculoskeletal Pain Musculoskeletal  pain is muscle and boney aches and pains. These pains can occur in any part of the body. Your caregiver may treat you without knowing the cause of the pain. They may treat you if blood or urine tests, X-rays, and other tests were normal.  CAUSES There is often not a definite cause or reason for these pains. These pains may be caused by a type of germ (virus). The discomfort may also come from overuse. Overuse includes working out too hard when your body is not fit. Boney aches also come from weather changes. Bone is sensitive to atmospheric pressure changes. HOME CARE INSTRUCTIONS   Ask when your test results will be ready. Make sure you get your test results.  Only take over-the-counter or prescription medicines for pain, discomfort, or fever as directed by your caregiver. If you were given medications for your condition, do not drive, operate machinery or power tools, or sign legal documents for 24 hours. Do not drink alcohol. Do not take  sleeping pills or other medications that may interfere with treatment.  Continue all activities unless the activities cause more pain. When the pain lessens, slowly resume normal activities. Gradually increase the intensity and duration of the activities or exercise.  During periods of severe pain, bed rest may be helpful. Lay or sit in any position that is comfortable.  Putting ice on the injured area.  Put ice in a bag.  Place a towel between your skin and the bag.  Leave the ice on for 15 to 20 minutes, 3 to 4 times a day.  Follow up with your caregiver for continued problems and no reason can be found for the pain. If the pain becomes worse or does not go away, it may be necessary to repeat tests or do additional testing. Your caregiver may need to look further for a possible cause. SEEK IMMEDIATE MEDICAL CARE IF:  You have pain that is getting worse and is not relieved by medications.  You develop chest pain that is associated with shortness or breath, sweating, feeling sick to your stomach (nauseous), or throw up (vomit).  Your pain becomes localized to the abdomen.  You develop any new symptoms that seem different or that concern you. MAKE SURE YOU:   Understand these instructions.  Will watch your condition.  Will get help right away if you are not doing well or get worse. Document Released: 10/28/2005 Document Revised: 01/20/2012 Document Reviewed: 07/02/2013 [facility] Patient Information 2015 [place], [place]. This information is not intended to replace advice given to you by your health care provider. Make sure you discuss any questions you have with your health care provider.

## 2015-03-27 NOTE — ED Provider Notes (Signed)
CSN: [id number]     Arrival date & time 03/27/15  4098 History   First MD Initiated Contact with Patient 03/27/15 [phone]     Chief Complaint  Patient presents with  . Fall     (Consider location/radiation/quality/duration/timing/severity/associated sxs/prior Treatment) HPI Comments: Paula [name] is a 81 y.o. female with a PMHx of dementia, GERD, CHF, DM2, HTN, DVT (on Xarelto), COPD, complete heart block s/p pacemaker insertion, age related macular degeneration resulting in blindness, hypothyroidism, and depression, who presents to the ED via EMS from [facility], accompanied by her husband who answers some questions, with complaints of ?fall this morning. LEVEL 5 CAVEAT history limited due to dementia. Patient's husband reports that he received call from [facility] stating the patient had been found this morning on the floor with her head on the pillow down on the floor. Patient states she is not sure if she fell but she thinks she did, and this was an unwitnessed fall. EMS personnel reporting that the SNF did not know whether she fell or not. Patient states that she has "pain all over" but specifically she reports pain in her right humeral region, but cannot describe it any further. Upon further questioning she also reports that she has knee pain. Her husband states that she has chronic bilateral lower extremity swelling for which she takes Lasix. He states that her PCP is not back in town to evaluate her until Thursday, but that this is an ongoing and unchanged issue. On the paperwork from the SNF, it's reported that she takes  lasix daily. He reports that she is on Xarelto, and this also is reported on her paperwork. She denies any headache, head pain, vision changes, chest pain, shortness of breath, cough, abdominal pain, nausea, or vomiting. Again, history is limited due to dementia, and pt occasionally states she isn't sure when asked ROS questions.  Patient is a 81 y.o. female  presenting with fall. The history is provided by the patient, the spouse and the nursing home. History limited by: dementia. No language interpreter was used.  Fall This is a recurrent problem. The current episode started today. The problem occurs intermittently. The problem has been unchanged. Associated symptoms include arthralgias (R upper arm, both knees). Pertinent negatives include no abdominal pain, chest pain, coughing, headaches, nausea, neck pain or vomiting. Nothing aggravates the symptoms. She has tried nothing for the symptoms. The treatment provided no relief.    Past Medical History  Diagnosis Date  . Depression   . Ejection fraction     EF 40%, echo, November, 2012  / in improved, EF 60%, echo, December, 2012  . Contrast media allergy     Patient feels poorly with contrast  . Status post AAA (abdominal aortic aneurysm) repair     Surgical repair, Dr. Hart Rochester, December 27, 2011  . Pre-syncope     vasovagal w/ heart block  . Parotid mass     has refused further eval 10/2011  . Anxiety   . Facial tic     L sided spasms - Botox trial summer 2013, ?effective  . ARMD (age related macular degeneration) 06/2014    bilateral vision loss - legally blind  . AAA (abdominal aortic aneurysm)     s/p repair 12/2011  . Congestive heart failure     NL EF 10/2011 echo   . Diabetes mellitus type II, controlled   . Hypothyroidism   . Sinus bradycardia 09/19/2011    Occurring simultaneously with complete heart block   .  COPD (chronic obstructive pulmonary disease)   . Facial spasm     L hemifacial-treated w/ botox xeomin -Dr Terrace Arabia  . Dementia     MRI with progressive chronic microvascular ischemia and generalized cerebral atrophy  . Lumbosacral spondylosis without myelopathy   . Legally blind 06/2014    due to ARMD  . DVT (deep venous thrombosis)   . Hypertension   . Diabetes mellitus without complication   . CHF (congestive heart failure)   . GERD (gastroesophageal reflux disease)     Past Surgical History  Procedure Laterality Date  . Cholecystectomy    . Knee cartilage surgery      left  . Sinus surgery with instatrak    . Appendectomy    . Oophorectomy      ovarian cyst  . Cataract extraction      bilateral  . Bladder repair    . US echocardiography  10/14/11  . Abdominal aortic aneurysm repair  12/27/2011    ANEURYSM ABDOMINAL AORTIC REPAIR;  Surgeon: Josephina Gip, MD; Resection and Grafting Abdominal Aortic Aneurysm , Aorta Bi Iliac.  Marland Kitchen Pacemaker insertion  11/12  . Cardiac catheterization  11/04/11  . Temporary pacemaker insertion N/A 09/19/2011    Procedure: TEMPORARY PACEMAKER INSERTION;  Surgeon: Dolores Patty, MD;  Location: [facility] CATH LAB;  Service: Cardiovascular;  Laterality: N/A;  . Left heart catheterization with coronary angiogram N/A 11/04/2011    Procedure: LEFT HEART CATHETERIZATION WITH CORONARY ANGIOGRAM;  Surgeon: Laurey Morale, MD;  Location: [facility] CATH LAB;  Service: Cardiovascular;  Laterality: N/A;   Family History  Problem Relation Age of Onset  . Esophageal cancer Father   . Cancer Father   . Breast cancer Sister   . Cancer Sister   . Colon cancer Sister   . Heart disease Sister   . Heart disease Mother   . Heart disease Son   . Parkinson's disease Sister     younger   History  Substance Use Topics  . Smoking status: Former Smoker -- 1.00 packs/day for 60 years    Types: Cigarettes    Quit date: 01/11/2011  . Smokeless tobacco: Never Used  . Alcohol Use: No   OB History    Gravida Para Term Preterm AB TAB SAB Ectopic Multiple Living   0 0 0 0 0 0 0 0        LEVEL 5 CAVEAT history limited due to dementia.   Review of Systems  Unable to perform ROS: Dementia  Eyes: Negative for visual disturbance.  Respiratory: Negative for cough and shortness of breath.   Cardiovascular: Positive for leg swelling (chronic). Negative for chest pain.  Gastrointestinal: Negative for nausea, vomiting and abdominal pain.   Musculoskeletal: Positive for arthralgias (R upper arm, both knees). Negative for back pain and neck pain.  Neurological: Negative for headaches.   10 Systems reviewed and are negative for acute change except as noted in the HPI.    Allergies  Bactrim; Contrast media; Iodine; Iohexol; Sulfa antibiotics; Aricept; Cymbalta; Delsym; Guaifenesin & derivatives; Hydromet; Donepezil; Duloxetine; Iodine; Penicillins; Quinolones; Sodium benzoate; Sodium benzoate; Sulfa antibiotics; Trimethoprim; Trimethoprim; Avelox; Ciprofloxacin; Gadolinium derivatives; Penicillins; and Quinolones  Home Medications   Prior to Admission medications   Medication Sig Start Date End Date Taking? Authorizing Provider  acetaminophen (TYLENOL) 500 MG tablet Take 500 mg by mouth every 6 (six) hours as needed (pain).    Historical Provider, MD  albuterol (PROVENTIL HFA;VENTOLIN HFA) 108 (90 BASE) MCG/ACT inhaler Inhale 2  puffs into the lungs every 6 (six) hours as needed for wheezing or shortness of breath. 03/24/14   Newt Lukes, MD  albuterol (PROVENTIL) (2.5 MG/3ML) 0.083% nebulizer solution Take 2.5 mg by nebulization every 8 (eight) hours as needed for wheezing.     Historical Provider, MD  Calcium Carb-Cholecalciferol (CALCIUM-VITAMIN D) 600-400 MG-UNIT TABS Take 1 tablet by mouth daily.    Historical Provider, MD  carvedilol (COREG) 6.25 MG tablet Take 6.25 mg by mouth 2 (two) times daily with a meal.    Historical Provider, MD  cefUROXime (CEFTIN) 250 MG tablet Take 1 tablet (250 mg total) by mouth 2 (two) times daily with a meal. 03/05/15   Cathren Laine, MD  Cholecalciferol (VITAMIN D3) 5000 UNITS CAPS Take 5,000 Units by mouth every other day.     Historical Provider, MD  clindamycin (CLEOCIN) 150 MG capsule Take 1 capsule (150 mg total) by mouth 3 (three) times daily. Patient not taking: Reported on 03/05/2015 01/13/15   Alison Murray, MD  clonazePAM (KLONOPIN) 0.5 MG tablet Take 1 tablet (0.5 mg total) by mouth  every 12 (twelve) hours as needed for anxiety. 01/13/15   Alison Murray, MD  dextromethorphan (DELSYM) 30 MG/5ML liquid Take 10 mLs by mouth at bedtime as needed for cough.    Historical Provider, MD  divalproex (DEPAKOTE) 250 MG DR tablet Take 250 mg by mouth 3 (three) times daily.    Historical Provider, MD  enoxaparin (LOVENOX) 80 MG/0.8ML injection Inject 0.8 mLs (80 mg total) into the skin every 12 (twelve) hours. Patient not taking: Reported on 03/05/2015 12/20/14   Penny Pia, MD  fexofenadine (ALLEGRA) 180 MG tablet Take 180 mg by mouth daily.    Historical Provider, MD  fluticasone (FLONASE) 50 MCG/ACT nasal spray Place 1 spray into both nostrils daily. 04/15/14   [name], MD  furosemide (LASIX) 20 MG tablet Take 20 mg by mouth daily.    Historical Provider, MD  Glucose Blood (BLOOD GLUCOSE TEST STRIPS) STRP 1 strip by In Vitro route 2 (two) times daily.    Historical Provider, MD  guaifenesin (ROBITUSSIN) 100 MG/5ML syrup Take 200 mg by mouth every 6 (six) hours as needed for cough.    Historical Provider, MD  haloperidol (HALDOL) 0.5 MG tablet Take 1 tablet (0.5 mg total) by mouth 3 (three) times daily. 01/13/15   Alison Murray, MD  ipratropium-albuterol (DUONEB) 0.5-2.5 (3) MG/3ML SOLN Take 3 mLs by nebulization 3 (three) times daily. Patient not taking: Reported on 03/05/2015 08/18/14   Vassie Loll, MD  levothyroxine (SYNTHROID, LEVOTHROID) 175 MCG tablet Take 175 mcg by mouth daily before breakfast.    Historical Provider, MD  loperamide (IMODIUM) 2 MG capsule Take 2 mg by mouth daily as needed for diarrhea or loose stools.    Historical Provider, MD  loratadine (CLARITIN) 10 MG tablet Take 10 mg by mouth daily.    Historical Provider, MD  losartan (COZAAR) 50 MG tablet Take 50 mg by mouth daily.    Historical Provider, MD  magnesium hydroxide (MILK OF MAGNESIA) 400 MG/5ML suspension Take 30 mLs by mouth daily as needed for mild constipation.    Historical Provider, MD  metFORMIN  (GLUCOPHAGE) 500 MG tablet Take 500 mg by mouth daily with breakfast.    Historical Provider, MD  Neomycin-Bacitracin-Polymyxin (TRIPLE ANTIBIOTIC EX) Apply 1 application topically 3 (three) times daily as needed (minor skin tears or abrasions.).     Historical Provider, MD  OLANZapine (ZYPREXA) 10 MG  tablet Take 1 tablet (10 mg total) by mouth daily. 01/13/15   Alison Murray, MD  ondansetron (ZOFRAN) 4 MG tablet Take 1 tablet (4 mg total) by mouth every 6 (six) hours as needed for nausea. 01/13/15   Alison Murray, MD  pantoprazole (PROTONIX) 40 MG tablet Take 40 mg by mouth daily.    Historical Provider, MD  Polyethyl Glycol-Propyl Glycol (SYSTANE) 0.4-0.3 % SOLN Apply 1 drop to eye 2 (two) times daily.     Historical Provider, MD  potassium chloride SA (K-DUR,KLOR-CON) 20 MEQ tablet Take 20 mEq by mouth daily.    Historical Provider, MD  predniSONE (DELTASONE) 20 MG tablet Take 2 tablets (40 mg total) by mouth daily. 03/05/15   Cathren Laine, MD  rivaroxaban (XARELTO) 20 MG TABS tablet Take 1 tablet (20 mg total) by mouth daily with supper. 01/13/15   Alison Murray, MD  rivastigmine (EXELON) 4.6 mg/24hr Place 4.6 mg onto the skin every morning.    Historical Provider, MD  traZODone (DESYREL) 150 MG tablet Take 1 tablet (150 mg total) by mouth at bedtime. 01/13/15   Alison Murray, MD  warfarin (COUMADIN) 6 MG tablet Take 1 tablet (6 mg total) by mouth one time only at 6 PM. Patient not taking: Reported on 03/05/2015 12/20/14   Penny Pia, MD   BP 151/71 mmHg  Pulse 90  Temp(Src) 98 F (36.7 C) (Oral)  Resp 18  SpO2 96% Physical Exam  Constitutional: Vital signs are normal. She appears well-developed and well-nourished.  Non-toxic appearance. No distress.  Afebrile, nontoxic, NAD  HENT:  Head: Normocephalic and atraumatic. Head is without raccoon's eyes, without Battle's sign, without abrasion and without contusion.  Mouth/Throat: Oropharynx is clear and moist and mucous membranes are normal.  Ramirez-Perez/AT,  no raccoon eyes or battle's sign, no abrasions or contusions, no scalp TTP or crepitus  Eyes: Conjunctivae and EOM are normal. Pupils are equal, round, and reactive to light. Right eye exhibits no discharge. Left eye exhibits no discharge.  PERRL, EOMI, no nystagmus, no visual field deficits   Neck: Normal range of motion. Neck supple. No spinous process tenderness and no muscular tenderness present. No rigidity. Normal range of motion present.  FROM intact without spinous process or paraspinous muscle TTP, no bony stepoffs or deformities, no muscle spasms. No rigidity or meningeal signs. No bruising or swelling.   Cardiovascular: Normal rate, regular rhythm, normal heart sounds and intact distal pulses.  Exam reveals no gallop and no friction rub.   No murmur heard. RRR, nl s1/s2, no m/r/g, distal pulses intact, mild-moderate b/l pedal edema   Pulmonary/Chest: Effort normal and breath sounds normal. No respiratory distress. She has no decreased breath sounds. She has no wheezes. She has no rhonchi. She has no rales.  Abdominal: Soft. Normal appearance and bowel sounds are normal. She exhibits no distension. There is no tenderness. There is no rigidity, no rebound and no guarding.  Musculoskeletal: Normal range of motion.       Right shoulder: Normal.       Right knee: She exhibits laceration (abrasion). She exhibits normal range of motion, no swelling, no effusion, no deformity, normal alignment, no LCL laxity and no MCL laxity. Tenderness found.       Left knee: She exhibits normal range of motion, no swelling, no deformity, no laceration, normal alignment, no LCL laxity and no MCL laxity. Tenderness found.       Right upper arm: She exhibits tenderness. She exhibits no  swelling and no deformity.       Arms:      Legs: R shoulder with FROM intact, no crepitus or effusion, with R humerus TTP diffusely throughout upper arm, no swelling or deformity, strength and sensation grossly intact in all  extremities, distal pulses intact, cap refill brisk and present. No tenderness to L shoulder/humerus, or b/l elbows or wrists. Mild swelling to L wrist although no tenderness. Wiggles all digits B/L knees with baseline ROM intact, abrasion to R knee and contusion to L patella, with TTP to b/l patella. No crepitus or deformities, no swelling or effusions. No varus/valgus laxity, no abnormal alignment All spinal levels nonTTP without bony step offs or deformities  Neurological: She is alert. She has normal strength. No cranial nerve deficit or sensory deficit. GCS eye subscore is 4. GCS verbal subscore is 4. GCS motor subscore is 5.  Alert but not oriented to situation or time. Somewhat oriented to place, although cannot recall city/state but knows hospital name. Referred to her husband as her father.  Unable to perform full neuro exam due to pt uncooperativeness, but no focal neuro deficits noted on exam GCS 13 due to confusion (baseline) and not obeying all commands CN 2-12 grossly intact  Skin: Skin is warm and dry. Abrasion and bruising noted. No rash noted.  R knee abrasion and L knee contusion as noted above  Psychiatric: She has a normal mood and affect.  Nursing note and vitals reviewed.   ED Course  Procedures (including critical care time) Labs Review Labs Reviewed  CBC WITH DIFFERENTIAL/PLATELET - Abnormal; Notable for the following:    WBC 13.3 (*)    RBC 3.19 (*)    Hemoglobin 9.9 (*)    HCT 31.7 (*)    RDW 17.2 (*)    Neutro Abs 9.7 (*)    All other components within normal limits  COMPREHENSIVE METABOLIC PANEL - Abnormal; Notable for the following:    Glucose, Bld 101 (*)    Total Protein 5.6 (*)    Albumin 3.4 (*)    ALT 10 (*)    All other components within normal limits  URINALYSIS, ROUTINE W REFLEX MICROSCOPIC    Imaging Review Ct Head Wo Contrast  03/27/2015   CLINICAL DATA:  Recent fall  EXAM: CT HEAD WITHOUT CONTRAST  TECHNIQUE: Contiguous axial images were  obtained from the base of the skull through the vertex without intravenous contrast.  COMPARISON:  03/05/2015  FINDINGS: Bony calvarium is intact. Increasing opacification of the ethmoid and sphenoid sinuses is noted. This may be in part related to the recent fall. Bony calvarium is otherwise intact. Diffuse atrophic changes are noted as well as chronic white matter ischemic change. No findings to suggest acute hemorrhage, acute infarction or space-occupying mass lesion are noted.  IMPRESSION: Chronic atrophic and ischemic changes without acute abnormality.  Increasing opacification in the paranasal sinuses as described.   Electronically Signed   By: Alcide Clever M.D.   On: 03/27/2015 08:06   Dg Knee Complete 4 Views Left  03/27/2015   CLINICAL DATA:  Left knee pain after fall.  Initial encounter.  EXAM: LEFT KNEE - COMPLETE 4+ VIEW  COMPARISON:  None.  FINDINGS: There is no evidence of fracture, dislocation, or joint effusion. Moderate narrowing of the medial joint space is noted with osteophyte formation. Soft tissues are unremarkable.  IMPRESSION: Moderate degenerative joint disease is noted medially. No acute abnormality seen in the left knee.   Electronically Signed  By: Lupita RaiderJames  Green Jr, M.D.   On: 03/27/2015 07:59   Dg Knee Complete 4 Views Right  03/27/2015   CLINICAL DATA:  Fall, bilateral knee pain, right knee abrasion  EXAM: RIGHT KNEE - COMPLETE 4+ VIEW  COMPARISON:  None.  FINDINGS: Four views of the right knee submitted. No acute fracture or subluxation. There is diffuse osteopenia. Narrowing of medial joint compartment. Mild spurring of medial femoral condyle and medial tibial plateau. Narrowing of patellofemoral joint space. Spurring of patella.  IMPRESSION: No acute fracture or subluxation. Diffuse osteopenia. Osteoarthritic changes as described above.   Electronically Signed   By: Natasha MeadLiviu  Pop M.D.   On: 03/27/2015 07:58   Dg Humerus Right  03/27/2015   CLINICAL DATA:  Status post  unwitnessed fall. Patient found down today. Right upper arm pain. Initial encounter.  EXAM: RIGHT HUMERUS - 2+ VIEW  COMPARISON:  None.  FINDINGS: No acute bony or joint abnormality is identified. Mild appearing acromioclavicular osteoarthritis is noted.  IMPRESSION: No acute abnormality.   Electronically Signed   By: Drusilla Kannerhomas  Dalessio M.D.   On: 03/27/2015 07:58   Dg Hand Complete Left  03/27/2015   CLINICAL DATA:  Status post fall. Patient found down this morning. Left hand pain. Initial encounter.  EXAM: LEFT HAND - COMPLETE 3+ VIEW  COMPARISON:  Plain films of the left hand 10/15/2014.  FINDINGS: No acute bony or joint abnormality is identified. Mild appearing degenerative change is seen about the first [facility] and DIP joint of the index finger. Ulnar minus variance is noted. Soft tissues about the wrist appear swollen.  IMPRESSION: Soft tissue swelling about the wrist without underlying acute bony or joint abnormality.   Electronically Signed   By: Drusilla Kannerhomas  Dalessio M.D.   On: 03/27/2015 07:13   Dg Hand Complete Right  03/27/2015   CLINICAL DATA:  Recent fall with hand swelling, initial encounter  EXAM: RIGHT HAND - COMPLETE 3+ VIEW  COMPARISON:  08/22/2014  FINDINGS: There is mild continued fragmentation of the fourth distal phalanx. No acute fracture is identified. No gross soft tissue abnormality is seen.  IMPRESSION: No acute abnormality noted.  Persistent fragmentation in the fourth distal phalanx.   Electronically Signed   By: Alcide CleverMark  Lukens M.D.   On: 03/27/2015 07:14     EKG Interpretation   Date/Time:  Monday Mar 27 2015 06:24:58 EDT Ventricular Rate:  89 PR Interval:  68 QRS Duration: 78 QT Interval:  387 QTC Calculation: 471 R Axis:   42 Text Interpretation:  Sinus rhythm Short PR interval ST elevation,  consider lateral injury Artifact in lead(s) I III aVL V1 Confirmed by  OTTER  MD, OLGA ([id number]) on 03/27/2015 6:59:55 AM      MDM   Final diagnoses:  Fall  Pain of right  upper extremity  Knee pain, bilateral  Contusion  Abrasion  Peripheral edema    81 y.o. female here after ?fall. Found on the floor with her pillow. Pt thinks she fell, cannot recall. Has hx of dementia, oriented to person and place but not time or situation. Husband at bedside states she's at her baseline. On exam, tender over R humerus and b/l knees. Abrasion to R knee and contusion to L knee. Will get imaging of these. B/L hand xrays obtained prior to my exam, which were negative. Extremities neurovascularly intact with soft compartments, moderate BLE edema which is chronic. On xarelto, will get CT head. No neck or back tenderness, doubt need for imaging of  this. Will get basic labs and U/A and reassess. EKG poor quality but relatively unchanged.  9:56 AM CBC showing nonspecific WBC elevation at 13.3, no obvious source. Stable anemia. CMP WNL. U/A clear. Xrays all negative. Discussed motrin/tylenol for pain, and ice therapy. Will have her f/up with her PCP on Thursday for recheck of labs and reassessment. I explained the diagnosis and have given explicit precautions to return to the ER including for any other new or worsening symptoms. The patient understands and accepts the medical plan as it's been dictated and I have answered their questions. Discharge instructions concerning home care and prescriptions have been given. The patient is STABLE and is discharged to home in good condition.  BP 163/74 mmHg  Pulse 96  Temp(Src) 98 F (36.7 C) (Oral)  Resp 23  SpO2 94%   Raynesha Tiedt Camprubi-Soms, PA-C 03/27/15 1000  Pricilla LovelessScott Goldston, MD 03/29/15 0022

## 2015-03-27 NOTE — ED Notes (Signed)
Patient transported to X-ray 

## 2015-04-26 ENCOUNTER — Encounter (HOSPITAL_COMMUNITY): Payer: Self-pay | Admitting: Emergency Medicine

## 2015-04-26 ENCOUNTER — Emergency Department (HOSPITAL_COMMUNITY): Payer: Medicare Other

## 2015-04-26 ENCOUNTER — Inpatient Hospital Stay (HOSPITAL_COMMUNITY): Payer: Medicare Other

## 2015-04-26 ENCOUNTER — Inpatient Hospital Stay (HOSPITAL_COMMUNITY)
Admission: EM | Admit: 2015-04-26 | Discharge: 2015-04-29 | DRG: 602 | Disposition: A | Discharge status: Skilled Nursing Facility | Payer: Medicare Other | Attending: Internal Medicine | Admitting: Internal Medicine

## 2015-04-26 DIAGNOSIS — Z87891 Personal history of nicotine dependence: Secondary | ICD-10-CM

## 2015-04-26 DIAGNOSIS — D638 Anemia in other chronic diseases classified elsewhere: Secondary | ICD-10-CM | POA: Diagnosis present

## 2015-04-26 DIAGNOSIS — F0390 Unspecified dementia without behavioral disturbance: Secondary | ICD-10-CM | POA: Diagnosis present

## 2015-04-26 DIAGNOSIS — J449 Chronic obstructive pulmonary disease, unspecified: Secondary | ICD-10-CM | POA: Diagnosis present

## 2015-04-26 DIAGNOSIS — E119 Type 2 diabetes mellitus without complications: Secondary | ICD-10-CM | POA: Diagnosis not present

## 2015-04-26 DIAGNOSIS — Z6830 Body mass index (BMI) 30.0-30.9, adult: Secondary | ICD-10-CM

## 2015-04-26 DIAGNOSIS — M79609 Pain in unspecified limb: Secondary | ICD-10-CM

## 2015-04-26 DIAGNOSIS — Z66 Do not resuscitate: Secondary | ICD-10-CM | POA: Diagnosis present

## 2015-04-26 DIAGNOSIS — Z803 Family history of malignant neoplasm of breast: Secondary | ICD-10-CM

## 2015-04-26 DIAGNOSIS — E876 Hypokalemia: Secondary | ICD-10-CM | POA: Diagnosis present

## 2015-04-26 DIAGNOSIS — T1490XA Injury, unspecified, initial encounter: Secondary | ICD-10-CM

## 2015-04-26 DIAGNOSIS — O2230 Deep phlebothrombosis in pregnancy, unspecified trimester: Secondary | ICD-10-CM | POA: Insufficient documentation

## 2015-04-26 DIAGNOSIS — Z8673 Personal history of transient ischemic attack (TIA), and cerebral infarction without residual deficits: Secondary | ICD-10-CM | POA: Diagnosis not present

## 2015-04-26 DIAGNOSIS — I82409 Acute embolism and thrombosis of unspecified deep veins of unspecified lower extremity: Secondary | ICD-10-CM | POA: Diagnosis not present

## 2015-04-26 DIAGNOSIS — I5033 Acute on chronic diastolic (congestive) heart failure: Secondary | ICD-10-CM | POA: Diagnosis present

## 2015-04-26 DIAGNOSIS — E039 Hypothyroidism, unspecified: Secondary | ICD-10-CM | POA: Diagnosis not present

## 2015-04-26 DIAGNOSIS — F068 Other specified mental disorders due to known physiological condition: Secondary | ICD-10-CM

## 2015-04-26 DIAGNOSIS — I82512 Chronic embolism and thrombosis of left femoral vein: Secondary | ICD-10-CM | POA: Diagnosis present

## 2015-04-26 DIAGNOSIS — Z794 Long term (current) use of insulin: Secondary | ICD-10-CM | POA: Diagnosis not present

## 2015-04-26 DIAGNOSIS — F329 Major depressive disorder, single episode, unspecified: Secondary | ICD-10-CM | POA: Diagnosis present

## 2015-04-26 DIAGNOSIS — I5032 Chronic diastolic (congestive) heart failure: Secondary | ICD-10-CM

## 2015-04-26 DIAGNOSIS — Z820 Family history of epilepsy and other diseases of the nervous system: Secondary | ICD-10-CM

## 2015-04-26 DIAGNOSIS — L03116 Cellulitis of left lower limb: Principal | ICD-10-CM | POA: Diagnosis present

## 2015-04-26 DIAGNOSIS — K219 Gastro-esophageal reflux disease without esophagitis: Secondary | ICD-10-CM | POA: Diagnosis present

## 2015-04-26 DIAGNOSIS — I10 Essential (primary) hypertension: Secondary | ICD-10-CM | POA: Diagnosis present

## 2015-04-26 DIAGNOSIS — Z993 Dependence on wheelchair: Secondary | ICD-10-CM | POA: Diagnosis not present

## 2015-04-26 DIAGNOSIS — I82532 Chronic embolism and thrombosis of left popliteal vein: Secondary | ICD-10-CM | POA: Diagnosis present

## 2015-04-26 DIAGNOSIS — Z7901 Long term (current) use of anticoagulants: Secondary | ICD-10-CM | POA: Diagnosis not present

## 2015-04-26 DIAGNOSIS — R531 Weakness: Secondary | ICD-10-CM | POA: Diagnosis present

## 2015-04-26 DIAGNOSIS — Z800 Family history of malignant neoplasm of digestive organs: Secondary | ICD-10-CM

## 2015-04-26 DIAGNOSIS — Z8679 Personal history of other diseases of the circulatory system: Secondary | ICD-10-CM

## 2015-04-26 DIAGNOSIS — H548 Legal blindness, as defined in USA: Secondary | ICD-10-CM | POA: Diagnosis present

## 2015-04-26 DIAGNOSIS — Z86718 Personal history of other venous thrombosis and embolism: Secondary | ICD-10-CM | POA: Diagnosis not present

## 2015-04-26 DIAGNOSIS — G459 Transient cerebral ischemic attack, unspecified: Secondary | ICD-10-CM | POA: Diagnosis present

## 2015-04-26 DIAGNOSIS — L0390 Cellulitis, unspecified: Secondary | ICD-10-CM | POA: Diagnosis present

## 2015-04-26 DIAGNOSIS — Z8249 Family history of ischemic heart disease and other diseases of the circulatory system: Secondary | ICD-10-CM | POA: Diagnosis not present

## 2015-04-26 LAB — COMPREHENSIVE METABOLIC PANEL
ALT: 12 U/L — ABNORMAL LOW (ref 14–54)
ANION GAP: 7 (ref 5–15)
AST: 31 U/L (ref 15–41)
Albumin: 3.7 g/dL (ref 3.5–5.0)
Alkaline Phosphatase: 49 U/L (ref 38–126)
BILIRUBIN TOTAL: 0.3 mg/dL (ref 0.3–1.2)
BUN: 10 mg/dL (ref 6–20)
CHLORIDE: 106 mmol/L (ref 101–111)
CO2: 25 mmol/L (ref 22–32)
CREATININE: 0.85 mg/dL (ref 0.44–1.00)
Calcium: 9.2 mg/dL (ref 8.9–10.3)
GFR calc non Af Amer: >60 mL/min (ref >60)
Glucose, Bld: 154 mg/dL — ABNORMAL HIGH (ref 65–99)
Potassium: 4.2 mmol/L (ref 3.5–5.1)
SODIUM: 138 mmol/L (ref 135–145)
Total Protein: 6.1 g/dL — ABNORMAL LOW (ref 6.5–8.1)

## 2015-04-26 LAB — RAPID URINE DRUG SCREEN, HOSP PERFORMED
AMPHETAMINES: NOT DETECTED
Barbiturates: NOT DETECTED
Benzodiazepines: NOT DETECTED
Cocaine: NOT DETECTED
Opiates: NOT DETECTED
TETRAHYDROCANNABINOL: NOT DETECTED

## 2015-04-26 LAB — URINALYSIS, ROUTINE W REFLEX MICROSCOPIC
BILIRUBIN URINE: NEGATIVE
Glucose, UA: NEGATIVE mg/dL
Hgb urine dipstick: NEGATIVE
KETONES UR: NEGATIVE mg/dL
Leukocytes, UA: NEGATIVE
Nitrite: NEGATIVE
PROTEIN: NEGATIVE mg/dL
Specific Gravity, Urine: 1.019 (ref 1.005–1.030)
Urobilinogen, UA: 0.2 mg/dL (ref 0.0–1.0)
pH: 7 (ref 5.0–8.0)

## 2015-04-26 LAB — CBC
HCT: 31.4 % — ABNORMAL LOW (ref 36.0–46.0)
Hemoglobin: 9.4 g/dL — ABNORMAL LOW (ref 12.0–15.0)
MCH: 28.6 pg (ref 26.0–34.0)
MCHC: 29.9 g/dL — ABNORMAL LOW (ref 30.0–36.0)
MCV: 95.4 fL (ref 78.0–100.0)
PLATELETS: 364 10*3/uL (ref 150–400)
RBC: 3.29 MIL/uL — ABNORMAL LOW (ref 3.87–5.11)
RDW: 18.6 % — ABNORMAL HIGH (ref 11.5–15.5)
WBC: 19.9 10*3/uL — AB (ref 4.0–10.5)

## 2015-04-26 LAB — DIFFERENTIAL
Basophils Absolute: 0.1 10*3/uL (ref 0.0–0.1)
Basophils Relative: 0 % (ref 0–1)
EOS PCT: 0 % (ref 0–5)
Eosinophils Absolute: 0.1 10*3/uL (ref 0.0–0.7)
LYMPHS ABS: 3.2 10*3/uL (ref 0.7–4.0)
Lymphocytes Relative: 16 % (ref 12–46)
Monocytes Absolute: 2 10*3/uL — ABNORMAL HIGH (ref 0.1–1.0)
Monocytes Relative: 10 % (ref 3–12)
NEUTROS PCT: 74 % (ref 43–77)
Neutro Abs: 14.7 10*3/uL — ABNORMAL HIGH (ref 1.7–7.7)

## 2015-04-26 LAB — GLUCOSE, CAPILLARY: GLUCOSE-CAPILLARY: 91 mg/dL (ref 65–99)

## 2015-04-26 LAB — TSH: TSH: 4.414 u[IU]/mL (ref 0.350–4.500)

## 2015-04-26 LAB — PROTIME-INR
INR: 1.22 (ref 0.00–1.49)
INR: 1.22 (ref 0.00–1.49)
PROTHROMBIN TIME: 15.6 s — AB (ref 11.6–15.2)
PROTHROMBIN TIME: 15.5 s — AB (ref 11.6–15.2)

## 2015-04-26 LAB — ETHANOL

## 2015-04-26 LAB — APTT: aPTT: 42 seconds — ABNORMAL HIGH (ref 24–37)

## 2015-04-26 LAB — TROPONIN I: Troponin I: <0.03 ng/mL (ref <0.031)

## 2015-04-26 MED ORDER — SODIUM CHLORIDE 0.9 % IJ SOLN
3.0000 mL | Freq: Two times a day (BID) | INTRAMUSCULAR | Status: DC
  Administered 2015-04-28 – 2015-04-29 (×3): 3 mL via INTRAVENOUS

## 2015-04-26 MED ORDER — ALBUTEROL SULFATE (2.5 MG/3ML) 0.083% IN NEBU: 2.5000 mg | INHALATION_SOLUTION | Freq: Three times a day (TID) | RESPIRATORY_TRACT | Status: DC | PRN

## 2015-04-26 MED ORDER — OLANZAPINE 10 MG PO TABS
10.0000 mg | ORAL_TABLET | Freq: Every day | ORAL | Status: DC
  Administered 2015-04-27 – 2015-04-29 (×3): 10 mg via ORAL
  Filled 2015-04-26 (×3): qty 1

## 2015-04-26 MED ORDER — INSULIN ASPART 100 UNIT/ML ~~LOC~~ SOLN: 0.0000 [IU] | Freq: Every day | SUBCUTANEOUS | Status: DC

## 2015-04-26 MED ORDER — ONDANSETRON HCL 4 MG PO TABS: 4.0000 mg | ORAL_TABLET | Freq: Four times a day (QID) | ORAL | Status: DC | PRN

## 2015-04-26 MED ORDER — RIVAROXABAN 20 MG PO TABS
20.0000 mg | ORAL_TABLET | Freq: Every day | ORAL | Status: DC
  Administered 2015-04-27 – 2015-04-29 (×3): 20 mg via ORAL
  Filled 2015-04-26 (×6): qty 1

## 2015-04-26 MED ORDER — INSULIN ASPART 100 UNIT/ML ~~LOC~~ SOLN
0.0000 [IU] | Freq: Three times a day (TID) | SUBCUTANEOUS | Status: DC
  Administered 2015-04-28: 1 [IU] via SUBCUTANEOUS

## 2015-04-26 MED ORDER — ONDANSETRON HCL 4 MG/2ML IJ SOLN: 4.0000 mg | Freq: Four times a day (QID) | INTRAMUSCULAR | Status: DC | PRN

## 2015-04-26 MED ORDER — VALPROIC ACID 250 MG PO CAPS
250.0000 mg | ORAL_CAPSULE | Freq: Three times a day (TID) | ORAL | Status: DC
  Administered 2015-04-26 – 2015-04-29 (×9): 250 mg via ORAL
  Filled 2015-04-26 (×11): qty 1

## 2015-04-26 MED ORDER — GUAIFENESIN-DM 100-10 MG/5ML PO SYRP: 5.0000 mL | ORAL_SOLUTION | ORAL | Status: DC | PRN

## 2015-04-26 MED ORDER — VANCOMYCIN HCL 10 G IV SOLR
1250.0000 mg | INTRAVENOUS | Status: AC
  Administered 2015-04-26: 1250 mg via INTRAVENOUS
  Filled 2015-04-26: qty 1250

## 2015-04-26 MED ORDER — LEVOTHYROXINE SODIUM 200 MCG PO TABS
200.0000 ug | ORAL_TABLET | Freq: Every day | ORAL | Status: DC
  Administered 2015-04-27 – 2015-04-29 (×3): 200 ug via ORAL
  Filled 2015-04-26 (×6): qty 1

## 2015-04-26 MED ORDER — IPRATROPIUM-ALBUTEROL 0.5-2.5 (3) MG/3ML IN SOLN
3.0000 mL | Freq: Three times a day (TID) | RESPIRATORY_TRACT | Status: DC
  Administered 2015-04-27 – 2015-04-28 (×4): 3 mL via RESPIRATORY_TRACT
  Filled 2015-04-26 (×5): qty 3

## 2015-04-26 MED ORDER — VANCOMYCIN HCL 10 G IV SOLR
1250.0000 mg | INTRAVENOUS | Status: DC
  Administered 2015-04-27: 1250 mg via INTRAVENOUS
  Filled 2015-04-26 (×2): qty 1250

## 2015-04-26 MED ORDER — PANTOPRAZOLE SODIUM 40 MG PO TBEC
40.0000 mg | DELAYED_RELEASE_TABLET | Freq: Every day | ORAL | Status: DC
  Administered 2015-04-27 – 2015-04-29 (×3): 40 mg via ORAL
  Filled 2015-04-26 (×3): qty 1

## 2015-04-26 MED ORDER — CARVEDILOL 6.25 MG PO TABS
6.2500 mg | ORAL_TABLET | Freq: Two times a day (BID) | ORAL | Status: DC
  Administered 2015-04-26 – 2015-04-29 (×6): 6.25 mg via ORAL
  Filled 2015-04-26 (×10): qty 1

## 2015-04-26 MED ORDER — CLONAZEPAM 0.5 MG PO TABS
0.5000 mg | ORAL_TABLET | Freq: Every day | ORAL | Status: DC
  Administered 2015-04-26 – 2015-04-28 (×3): 0.5 mg via ORAL
  Filled 2015-04-26 (×3): qty 1

## 2015-04-26 MED ORDER — RIVASTIGMINE 4.6 MG/24HR TD PT24
4.6000 mg | MEDICATED_PATCH | Freq: Every morning | TRANSDERMAL | Status: DC
  Administered 2015-04-27 – 2015-04-29 (×3): 4.6 mg via TRANSDERMAL
  Filled 2015-04-26 (×3): qty 1

## 2015-04-26 MED ORDER — BISACODYL 10 MG RE SUPP: 10.0000 mg | Freq: Every day | RECTAL | Status: DC | PRN

## 2015-04-26 MED ORDER — FUROSEMIDE 10 MG/ML IJ SOLN
40.0000 mg | Freq: Two times a day (BID) | INTRAMUSCULAR | Status: DC
  Administered 2015-04-26 – 2015-04-28 (×4): 40 mg via INTRAVENOUS
  Filled 2015-04-26 (×7): qty 4

## 2015-04-26 NOTE — Progress Notes (Signed)
VASCULAR LAB PRELIMINARY  PRELIMINARY  PRELIMINARY  PRELIMINARY  Bilateral lower extremity venous duplex completed.    Preliminary report:  Right - No evidence of DVT, superficial thrombosis, or Baker's cyst.  Left no evidence of acute DVT. There are aras of chronic DVT in the distal common femoral, femora, and proximal popliteal veins. No evidence of superficial thrombosis or Baker's cyst.  Mahamadou Weltz, RVS 04/26/2015, 2:59 PM

## 2015-04-26 NOTE — Evaluation (Signed)
Clinical/Bedside Swallow Evaluation Patient Details  Name: Paula [name] MRN: [id number] Date of Birth: 02-13-1927  Today's Date: 04/26/2015 Time: SLP Start Time (ACUTE ONLY): 1450 SLP Stop Time (ACUTE ONLY): 1510 SLP Time Calculation (min) (ACUTE ONLY): 20 min  Past Medical History:  Past Medical History  Diagnosis Date  . Depression   . Ejection fraction     EF 40%, echo, November, 2012  / in improved, EF 60%, echo, December, 2012  . Contrast media allergy     Patient feels poorly with contrast  . Status post AAA (abdominal aortic aneurysm) repair     Surgical repair, Dr. Hart Rochester, December 27, 2011  . Pre-syncope     vasovagal w/ heart block  . Parotid mass     has refused further eval 10/2011  . Anxiety   . Facial tic     L sided spasms - Botox trial summer 2013, ?effective  . ARMD (age related macular degeneration) 06/2014    bilateral vision loss - legally blind  . AAA (abdominal aortic aneurysm)     s/p repair 12/2011  . Congestive heart failure     NL EF 10/2011 echo   . Diabetes mellitus type II, controlled   . Hypothyroidism   . Sinus bradycardia 09/19/2011    Occurring simultaneously with complete heart block   . COPD (chronic obstructive pulmonary disease)   . Facial spasm     L hemifacial-treated w/ botox xeomin -Dr Terrace Arabia  . Dementia     MRI with progressive chronic microvascular ischemia and generalized cerebral atrophy  . Lumbosacral spondylosis without myelopathy   . Legally blind 06/2014    due to ARMD  . DVT (deep venous thrombosis)   . Hypertension   . Diabetes mellitus without complication   . CHF (congestive heart failure)   . GERD (gastroesophageal reflux disease)    Past Surgical History:  Past Surgical History  Procedure Laterality Date  . Cholecystectomy    . Knee cartilage surgery      left  . Sinus surgery with instatrak    . Appendectomy    . Oophorectomy      ovarian cyst  . Cataract extraction      bilateral  . Bladder repair    . US  echocardiography  10/14/11  . Abdominal aortic aneurysm repair  12/27/2011    ANEURYSM ABDOMINAL AORTIC REPAIR;  Surgeon: Josephina Gip, MD; Resection and Grafting Abdominal Aortic Aneurysm , Aorta Bi Iliac.  Marland Kitchen Pacemaker insertion  11/12  . Cardiac catheterization  11/04/11  . Temporary pacemaker insertion N/A 09/19/2011    Procedure: TEMPORARY PACEMAKER INSERTION;  Surgeon: Dolores Patty, MD;  Location: [facility] CATH LAB;  Service: Cardiovascular;  Laterality: N/A;  . Left heart catheterization with coronary angiogram N/A 11/04/2011    Procedure: LEFT HEART CATHETERIZATION WITH CORONARY ANGIOGRAM;  Surgeon: Laurey Morale, MD;  Location: [facility] CATH LAB;  Service: Cardiovascular;  Laterality: N/A;   HPI:  Paula [name] is a 81 y.o. female, history of dementia, legally blind, diabetes mellitus type 2, history of DVT on xaralto, TIA, chronic diastolic dysfunction EF now 50% on recent echogram in 2015, hypothyroidism, essential hypertension who lives at a nursing home and has had issues with cellulitis in the legs.  Pt also has left side facial spasms for which she received Botox tx.  Per Md note, husband found that pt's left leg is red and both her legs are more swollen than before. Per Md note, pt is at baseline  minimally verbal, she does have a chronic left-sided facial droop according to the husband, he says that her droop becomes prominent when she gets sick, in the ER patient had leukocytosis, exam revealed left leg cellulitis. Head CT was unremarkable.     Assessment / Plan / Recommendation Clinical Impression  Pt presents with overall functional oropharyngeal swallow based on clinical swallow evaluation.  No s/s of aspiration or airway compromise with thin, apple sauce or solids nor indication of residuals.  Pt did become minimally dyspneic with intake and reviewed need to rest for energy conservation/airway protection. CN exam showed slight decreased left facial asymmetry, lingual deviation slight to  right upon protrusion.  Swallow was timely and voice remained clear.  Pt admits to occasional problems with coughing on foods more than drinks and states her husband "Paula [name]" says she eats too fast.  Reviewed finding of MBS in 2013 with pt and recommended to incorporate precautions.  Wrote sign and posted above pt's bed.  Using teach back, reviewed aspiration precautions with pt.  Given pt is blind, did not provide her information in writing. SlP to sign off, please reorder if desire/indicated.     Aspiration Risk  Mild    Diet Recommendation Age appropriate regular solids;Thin   Medication Administration: Whole meds with liquid Compensations: Small sips/bites;Slow rate (consume liquids t/o meal, rest if short of breath )    Other  Recommendations Oral Care Recommendations: Oral care BID   Follow Up Recommendations       Frequency and Duration        Pertinent Vitals/Pain Low grade fever, clear but decreased breath sounds      Swallow Study Prior Functional Status   see hhx    General Date of Onset: 04/26/15 Other Pertinent Information: [name] is a 81 y.o. female, history of dementia, legally blind, diabetes mellitus type 2, history of DVT on xaralto, TIA, chronic diastolic dysfunction EF now 50% on recent echogram in 2015, hypothyroidism, essential hypertension who lives at a nursing home and has had issues with cellulitis in the legs.  Pt also has left side facial spasms for which she received Botox tx.  Per Md note, husband found that pt's left leg is red and both her legs are more swollen than before. Per Md note, pt is at baseline minimally verbal, she does have a chronic left-sided facial droop according to the husband, he says that her droop becomes prominent when she gets sick, in the ER patient had leukocytosis, exam revealed left leg cellulitis. Head CT was unremarkable.   Previous Swallow Assessment: MBS 05/2012 mild pharyngeal dysphagia, mild solid stasis, surge of thin  barium distally in esophagus Diet Prior to this Study: Regular;Thin liquids Temperature Spikes Noted: Yes (low grade) Respiratory Status: Supplemental O2 delivered via (comment) (2 liters, pt reports on room air normally) History of Recent Intubation: No Behavior/Cognition: Alert;Cooperative;Pleasant mood Oral Cavity - Dentition: Adequate natural dentition/normal for age (some dentition missing, pt reports she has a partial she does not wear) Self-Feeding Abilities: Needs assist (due to ? tremorous hand movement and blindness) Patient Positioning: Upright in bed Baseline Vocal Quality: Normal Volitional Cough: Strong Volitional Swallow: Able to elicit    Oral/Motor/Sensory Function Overall Oral Motor/Sensory Function:  (minimal decreased left facial movement, lingual deviation to right slight upon protrusion)   Ice Chips Ice chips: Not tested   Thin Liquid Thin Liquid: Within functional limits Presentation: Straw    Nectar Thick Nectar Thick Liquid: Not tested   Honey Thick  Honey Thick Liquid: Not tested   Puree Puree: Within functional limits Presentation: Spoon   Solid   GO    Solid: Within functional limits      Donavan Burnet, MS [facility] SLP [phone]

## 2015-04-26 NOTE — ED Notes (Signed)
Tech tried x2 for blood cultures, no success

## 2015-04-26 NOTE — ED Provider Notes (Signed)
CSN: [id number]     Arrival date & time 04/26/15  0945 History   First MD Initiated Contact with Patient 04/26/15 [phone]     Chief Complaint  Patient presents with  . Weakness     (Consider location/radiation/quality/duration/timing/severity/associated sxs/prior Treatment) HPI Comments: From SNF  EMS transport Possible fall - unwitnessed Generalized weakness.   According to nursing staff at the facility they felt that she has had some right-sided facial droop, it is unclear the time of onset, it is been persistent this morning, she has no other focal deficits, she has had some swelling of her upper extremities and lower extremities, there is a questionable fall yesterday.  Patient is a 81 y.o. female presenting with weakness. The history is provided by the patient, the EMS personnel, the nursing home and medical records.  Weakness    Past Medical History  Diagnosis Date  . Depression   . Ejection fraction     EF 40%, echo, November, 2012  / in improved, EF 60%, echo, December, 2012  . Contrast media allergy     Patient feels poorly with contrast  . Status post AAA (abdominal aortic aneurysm) repair     Surgical repair, Dr. Hart Rochester, December 27, 2011  . Pre-syncope     vasovagal w/ heart block  . Parotid mass     has refused further eval 10/2011  . Anxiety   . Facial tic     L sided spasms - Botox trial summer 2013, ?effective  . ARMD (age related macular degeneration) 06/2014    bilateral vision loss - legally blind  . AAA (abdominal aortic aneurysm)     s/p repair 12/2011  . Congestive heart failure     NL EF 10/2011 echo   . Diabetes mellitus type II, controlled   . Hypothyroidism   . Sinus bradycardia 09/19/2011    Occurring simultaneously with complete heart block   . COPD (chronic obstructive pulmonary disease)   . Facial spasm     L hemifacial-treated w/ botox xeomin -Dr Terrace Arabia  . Dementia     MRI with progressive chronic microvascular ischemia and generalized cerebral  atrophy  . Lumbosacral spondylosis without myelopathy   . Legally blind 06/2014    due to ARMD  . DVT (deep venous thrombosis)   . Hypertension   . Diabetes mellitus without complication   . CHF (congestive heart failure)   . GERD (gastroesophageal reflux disease)    Past Surgical History  Procedure Laterality Date  . Cholecystectomy    . Knee cartilage surgery      left  . Sinus surgery with instatrak    . Appendectomy    . Oophorectomy      ovarian cyst  . Cataract extraction      bilateral  . Bladder repair    . US echocardiography  10/14/11  . Abdominal aortic aneurysm repair  12/27/2011    ANEURYSM ABDOMINAL AORTIC REPAIR;  Surgeon: Josephina Gip, MD; Resection and Grafting Abdominal Aortic Aneurysm , Aorta Bi Iliac.  Marland Kitchen Pacemaker insertion  11/12  . Cardiac catheterization  11/04/11  . Temporary pacemaker insertion N/A 09/19/2011    Procedure: TEMPORARY PACEMAKER INSERTION;  Surgeon: Dolores Patty, MD;  Location: [facility] CATH LAB;  Service: Cardiovascular;  Laterality: N/A;  . Left heart catheterization with coronary angiogram N/A 11/04/2011    Procedure: LEFT HEART CATHETERIZATION WITH CORONARY ANGIOGRAM;  Surgeon: Laurey Morale, MD;  Location: [facility] CATH LAB;  Service: Cardiovascular;  Laterality: N/A;  Family History  Problem Relation Age of Onset  . Esophageal cancer Father   . Cancer Father   . Breast cancer Sister   . Cancer Sister   . Colon cancer Sister   . Heart disease Sister   . Heart disease Mother   . Heart disease Son   . Parkinson's disease Sister     younger   History  Substance Use Topics  . Smoking status: Former Smoker -- 1.00 packs/day for 60 years    Types: Cigarettes    Quit date: 01/11/2011  . Smokeless tobacco: Never Used  . Alcohol Use: No   OB History    Gravida Para Term Preterm AB TAB SAB Ectopic Multiple Living   0 0 0 0 0 0 0 0       Review of Systems  Unable to perform ROS: Dementia  Neurological: Positive for weakness.       Allergies  Bactrim; Contrast media; Iodine; Iohexol; Sulfa antibiotics; Aricept; Cymbalta; Delsym; Guaifenesin & derivatives; Hydromet; Donepezil; Duloxetine; Iodine; Penicillins; Quinolones; Sodium benzoate; Sodium benzoate; Sulfa antibiotics; Trimethoprim; Trimethoprim; Avelox; Ciprofloxacin; Gadolinium derivatives; Penicillins; and Quinolones  Home Medications   Prior to Admission medications   Medication Sig Start Date End Date Taking? Authorizing Provider  acetaminophen (TYLENOL) 500 MG tablet Take 500 mg by mouth 4 (four) times daily.    Yes Historical Provider, MD  albuterol (PROVENTIL HFA;VENTOLIN HFA) 108 (90 BASE) MCG/ACT inhaler Inhale 2 puffs into the lungs every 6 (six) hours as needed for wheezing or shortness of breath. 03/24/14  Yes Newt Lukes, MD  albuterol (PROVENTIL) (2.5 MG/3ML) 0.083% nebulizer solution Take 2.5 mg by nebulization every 8 (eight) hours as needed for wheezing.    Yes Historical Provider, MD  Calcium Carb-Cholecalciferol (CALCIUM-VITAMIN D) 600-400 MG-UNIT TABS Take 1 tablet by mouth daily.   Yes Historical Provider, MD  carvedilol (COREG) 6.25 MG tablet Take 6.25 mg by mouth 2 (two) times daily with a meal.   Yes Historical Provider, MD  Cholecalciferol (VITAMIN D3) 5000 UNITS CAPS Take 5,000 Units by mouth every other day.    Yes Historical Provider, MD  clonazePAM (KLONOPIN) 0.5 MG tablet Take 1 tablet (0.5 mg total) by mouth every 12 (twelve) hours as needed for anxiety. 01/13/15  Yes Alison Murray, MD  dextromethorphan (DELSYM) 30 MG/5ML liquid Take 10 mLs by mouth at bedtime as needed for cough.   Yes Historical Provider, MD  divalproex (DEPAKOTE) 250 MG DR tablet Take 250 mg by mouth 3 (three) times daily.   Yes Historical Provider, MD  fexofenadine (ALLEGRA) 180 MG tablet Take 180 mg by mouth daily.   Yes Historical Provider, MD  fluticasone (FLONASE) 50 MCG/ACT nasal spray Place 1 spray into both nostrils daily. 04/15/14  Yes [name], MD  furosemide (LASIX) 20 MG tablet Take 20 mg by mouth daily.   Yes Historical Provider, MD  guaifenesin (ROBITUSSIN) 100 MG/5ML syrup Take 200 mg by mouth every 6 (six) hours as needed for cough.   Yes Historical Provider, MD  Infant Care Products (JOHNSONS BABY) SHAM Apply topically See admin instructions. Gently rub closed eye lids with diluted solutions of baby shampoo. Use a clean washcloth, cotton tipped applicator sticks, or cotton balls daily for 21 days.   Yes Historical Provider, MD  levothyroxine (SYNTHROID, LEVOTHROID) 200 MCG tablet Take 200 mcg by mouth daily before breakfast.   Yes Historical Provider, MD  loratadine (CLARITIN) 10 MG tablet Take 10 mg by mouth daily.  Yes Historical Provider, MD  losartan (COZAAR) 50 MG tablet Take 50 mg by mouth daily.   Yes Historical Provider, MD  magnesium hydroxide (MILK OF MAGNESIA) 400 MG/5ML suspension Take 30 mLs by mouth daily as needed for mild constipation.   Yes Historical Provider, MD  metFORMIN (GLUCOPHAGE) 500 MG tablet Take 500 mg by mouth daily with breakfast.   Yes Historical Provider, MD  Neomycin-Bacitracin-Polymyxin (TRIPLE ANTIBIOTIC EX) Apply 1 application topically 2 (two) times daily. Right Knee   Yes Historical Provider, MD  OLANZapine (ZYPREXA) 10 MG tablet Take 1 tablet (10 mg total) by mouth daily. 01/13/15  Yes Alison Murray, MD  ondansetron (ZOFRAN) 4 MG tablet Take 1 tablet (4 mg total) by mouth every 6 (six) hours as needed for nausea. 01/13/15  Yes Alison Murray, MD  pantoprazole (PROTONIX) 40 MG tablet Take 40 mg by mouth daily.   Yes Historical Provider, MD  Polyethyl Glycol-Propyl Glycol (SYSTANE) 0.4-0.3 % SOLN Apply 1 drop to eye 2 (two) times daily.    Yes Historical Provider, MD  potassium chloride SA (K-DUR,KLOR-CON) 20 MEQ tablet Take 20 mEq by mouth daily.   Yes Historical Provider, MD  rivaroxaban (XARELTO) 20 MG TABS tablet Take 1 tablet (20 mg total) by mouth daily with supper. Patient taking  differently: Take 20 mg by mouth daily.  01/13/15  Yes Alison Murray, MD  rivastigmine (EXELON) 4.6 mg/24hr Place 4.6 mg onto the skin every morning.   Yes Historical Provider, MD  traZODone (DESYREL) 150 MG tablet Take 1 tablet (150 mg total) by mouth at bedtime. 01/13/15  Yes Alison Murray, MD  cefUROXime (CEFTIN) 250 MG tablet Take 1 tablet (250 mg total) by mouth 2 (two) times daily with a meal. Patient not taking: Reported on 03/27/2015 03/05/15   Cathren Laine, MD  clindamycin (CLEOCIN) 150 MG capsule Take 1 capsule (150 mg total) by mouth 3 (three) times daily. Patient not taking: Reported on 03/05/2015 01/13/15   Alison Murray, MD  enoxaparin (LOVENOX) 80 MG/0.8ML injection Inject 0.8 mLs (80 mg total) into the skin every 12 (twelve) hours. Patient not taking: Reported on 03/05/2015 12/20/14   Penny Pia, MD  haloperidol (HALDOL) 0.5 MG tablet Take 1 tablet (0.5 mg total) by mouth 3 (three) times daily. 01/13/15   Alison Murray, MD  ipratropium-albuterol (DUONEB) 0.5-2.5 (3) MG/3ML SOLN Take 3 mLs by nebulization 3 (three) times daily. Patient not taking: Reported on 03/05/2015 08/18/14   Vassie Loll, MD  predniSONE (DELTASONE) 20 MG tablet Take 2 tablets (40 mg total) by mouth daily. Patient not taking: Reported on 03/27/2015 03/05/15   Cathren Laine, MD  warfarin (COUMADIN) 6 MG tablet Take 1 tablet (6 mg total) by mouth one time only at 6 PM. Patient not taking: Reported on 03/05/2015 12/20/14   Penny Pia, MD   BP 140/54 mmHg  Pulse 80  Temp(Src) 98.6 F (37 C) (Oral)  Resp 20  Ht 5\' 2"  (1.575 m)  Wt 164 lb 14.5 oz (74.8 kg)  BMI 30.15 kg/m2  SpO2 100% Physical Exam  Constitutional: She appears well-developed and well-nourished. No distress.  HENT:  Head: Normocephalic and atraumatic.  Mouth/Throat: Oropharynx is clear and moist. No oropharyngeal exudate.  MM dry  Eyes: Conjunctivae and EOM are normal. Pupils are equal, round, and reactive to light. Right eye exhibits no discharge. Left  eye exhibits no discharge. No scleral icterus.  Neck: Normal range of motion. Neck supple. No JVD present. No thyromegaly present.  Cardiovascular: Normal rate, regular rhythm, normal heart sounds and intact distal pulses.  Exam reveals no gallop and no friction rub.   No murmur heard. Pulmonary/Chest: Effort normal and breath sounds normal. No respiratory distress. She has no wheezes. She has no rales.  Abdominal: Soft. Bowel sounds are normal. She exhibits no distension and no mass. There is no tenderness.  Musculoskeletal: Normal range of motion. She exhibits edema ( bialteral LE edema L > R, LUE edema compared to R.) and tenderness ( L knee with ttp with ROM, has some abrasion and redness with mild swelling.).  Lymphadenopathy:    She has no cervical adenopathy.  Neurological: She is alert. Coordination normal.  Speaking in full sentences, easily confused, memory not intact, she knows her name and that she is in a hospital, unsure why she is here.  Follows commands, normal speech, normal strength in all 4 extremities - able to lift legs off bed barely bilaterally.  R sided facial droop (slight).  Skin: Skin is warm and dry. Rash noted. There is erythema.  LLE with erythema and warmth  Psychiatric: She has a normal mood and affect. Her behavior is normal.  Nursing note and vitals reviewed.   ED Course  Procedures (including critical care time) Labs Review Labs Reviewed  PROTIME-INR - Abnormal; Notable for the following:    Prothrombin Time 15.6 (*)    All other components within normal limits  APTT - Abnormal; Notable for the following:    aPTT 42 (*)    All other components within normal limits  CBC - Abnormal; Notable for the following:    WBC 19.9 (*)    RBC 3.29 (*)    Hemoglobin 9.4 (*)    HCT 31.4 (*)    MCHC 29.9 (*)    RDW 18.6 (*)    All other components within normal limits  DIFFERENTIAL - Abnormal; Notable for the following:    Neutro Abs 14.7 (*)    Monocytes  Absolute 2.0 (*)    All other components within normal limits  COMPREHENSIVE METABOLIC PANEL - Abnormal; Notable for the following:    Glucose, Bld 154 (*)    Total Protein 6.1 (*)    ALT 12 (*)    All other components within normal limits  URINALYSIS, ROUTINE W REFLEX MICROSCOPIC (NOT AT [facility]) - Abnormal; Notable for the following:    APPearance CLOUDY (*)    All other components within normal limits  PROTIME-INR - Abnormal; Notable for the following:    Prothrombin Time 15.5 (*)    All other components within normal limits  CULTURE, BLOOD (ROUTINE X 2)  CULTURE, BLOOD (ROUTINE X 2)  ETHANOL  URINE RAPID DRUG SCREEN, HOSP PERFORMED  TROPONIN I  TSH  HEMOGLOBIN A1C  BASIC METABOLIC PANEL  CBC  I-STAT CHEM 8, ED  I-STAT TROPOININ, ED    Imaging Review Ct Head Wo Contrast  04/26/2015   CLINICAL DATA:  Fall yesterday. Headache. Confusion. Weakness. Dementia.  EXAM: CT HEAD WITHOUT CONTRAST  TECHNIQUE: Contiguous axial images were obtained from the base of the skull through the vertex without intravenous contrast.  COMPARISON:  03/27/2015  FINDINGS: There is no evidence of intracranial hemorrhage, brain edema, or other signs of acute infarction. There is no evidence of intracranial mass lesion or mass effect. No abnormal extraaxial fluid collections are identified.  Mild diffuse cerebral atrophy and moderate to severe chronic small vessel disease are unchanged in appearance. Ventricles are stable in size. No skull abnormality  identified. Near complete resolution of opacification of ethmoid and sphenoid sinuses noted since prior study.  IMPRESSION: No acute intracranial findings.  Stable cerebral atrophy and chronic small vessel disease.   Electronically Signed   By: Myles Rosenthal M.D.   On: 04/26/2015 12:22   Dg Knee Complete 4 Views Left  04/26/2015   CLINICAL DATA:  Fall yesterday with left knee pain and abrasions anteriorly. Initial encounter.  EXAM: LEFT KNEE - COMPLETE 4+ VIEW   COMPARISON:  03/27/2015  FINDINGS: No acute fracture or dislocation is identified. There is likely a small knee joint effusion. Mild-to-moderate joint space narrowing is again seen in the medial compartment with associated marginal osteophytosis, not significantly changed. No lytic or blastic osseous lesion is identified. Vascular calcifications are noted.  IMPRESSION: No acute osseous abnormality identified. Small knee joint effusion and medial compartment osteoarthrosis.   Electronically Signed   By: Sebastian Ache   On: 04/26/2015 11:33     EKG Interpretation   Date/Time:  Wednesday April 26 2015 09:56:11 EDT Ventricular Rate:  89 PR Interval:  218 QRS Duration: 89 QT Interval:  419 QTC Calculation: 510 R Axis:   58 Text Interpretation:  Sinus rhythm Borderline prolonged PR interval  Nonspecific T abnrm, anterolateral leads Minimal ST elevation, inferior  leads Prolonged QT interval since last tracing no significant change  Abnormal ekg Confirmed by Patricia Fargo  MD, Charlye Spare ([id number]) on 04/26/2015 10:21:22  AM      MDM   Final diagnoses:  Trauma  Cellulitis of left lower extremity   Possible new facial droop - injury to the R knee - unclear about fall - NH has no history to support when or why but wound looks fresh - VS without sig findings - Stroke w/u.  Family member has arrived and states that the facial droop is old  Has some swelling of the legs and redness of the LLE c/w Cellulitis - admit to hospitalist.  Eber Hong, MD 04/26/15 1729

## 2015-04-26 NOTE — H&P (Signed)
Patient Demographics:    Paula [name], is a 81 y.o. female  MRN: [id number]   DOB - April 15, 1927  Admit Date - 04/26/2015  Outpatient Primary MD for the patient is [name], MD   With History of -  Past Medical History  Diagnosis Date  . Depression   . Ejection fraction     EF 40%, echo, November, 2012  / in improved, EF 60%, echo, December, 2012  . Contrast media allergy     Patient feels poorly with contrast  . Status post AAA (abdominal aortic aneurysm) repair     Surgical repair, Dr. Hart [place], December 27, 2011  . Pre-syncope     vasovagal w/ heart block  . Parotid mass     has refused further eval 10/2011  . Anxiety   . Facial tic     L sided spasms - Botox trial summer 2013, ?effective  . ARMD (age related macular degeneration) 06/2014    bilateral vision loss - legally blind  . AAA (abdominal aortic aneurysm)     s/p repair 12/2011  . Congestive heart failure     NL EF 10/2011 echo   . Diabetes mellitus type II, controlled   . Hypothyroidism   . Sinus bradycardia 09/19/2011    Occurring simultaneously with complete heart block   . COPD (chronic obstructive pulmonary disease)   . Facial spasm     L hemifacial-treated w/ botox xeomin -Dr Terrace Arabia  . Dementia     MRI with progressive chronic microvascular ischemia and generalized cerebral atrophy  . Lumbosacral spondylosis without myelopathy   . Legally blind 06/2014    due to ARMD  . DVT (deep venous thrombosis)   . Hypertension   . Diabetes mellitus without complication   . CHF (congestive heart failure)   . GERD (gastroesophageal reflux disease)       Past Surgical History  Procedure Laterality Date  . Cholecystectomy    . Knee cartilage surgery      left  . Sinus surgery with instatrak    . Appendectomy    . Oophorectomy     ovarian cyst  . Cataract extraction      bilateral  . Bladder repair    . US echocardiography  10/14/11  . Abdominal aortic aneurysm repair  12/27/2011    ANEURYSM ABDOMINAL AORTIC REPAIR;  Surgeon: Josephina Gip, MD; Resection and Grafting Abdominal Aortic Aneurysm , Aorta Bi Iliac.  Marland Kitchen Pacemaker insertion  11/12  . Cardiac catheterization  11/04/11  . Temporary pacemaker insertion N/A 09/19/2011    Procedure: TEMPORARY PACEMAKER INSERTION;  Surgeon: Dolores Patty, MD;  Location: [facility] CATH LAB;  Service: Cardiovascular;  Laterality: N/A;  . Left heart catheterization with coronary angiogram N/A 11/04/2011    Procedure: LEFT HEART CATHETERIZATION WITH CORONARY ANGIOGRAM;  Surgeon: Laurey Morale, MD;  Location: [facility] CATH LAB;  Service: Cardiovascular;  Laterality: N/A;    in  for   Chief Complaint  Patient presents with  . Weakness      HPI:    Paula [name]  is a 81 y.o. female, history of dementia, legally blind, diabetes mellitus type 2, history of DVT on xaralto, TIA, chronic diastolic dysfunction EF now 50% on recent echogram in 2015, hypothyroidism, essential hypertension who lives at a nursing home and has had issues with cellulitis in the legs. Has been brought in after her husband found that her left leg is red and both her legs are more swollen than before.  She is at baseline minimally verbal, she does have a chronic left-sided facial droop according to the husband, he says that her droop becomes prominent when she gets sick, in the ER patient had leukocytosis, exam revealed left leg cellulitis. Head CT was unremarkable. I was called to admit the patient.    Review of systems:   Patient is demented and denies any subjective complaints. However she is a unreliable historian.  In addition to the HPI above,   No Fever-chills, No Headache, No changes with Vision or hearing, No problems swallowing food or Liquids, No Chest pain, Cough or Shortness of Breath, No Abdominal pain,  No Nausea or Vommitting, Bowel movements are regular, No Blood in stool or Urine, No dysuria, No new skin rashes or bruises, No new joints pains-aches,  No new weakness, tingling, numbness in any extremity, No recent weight gain or loss, No polyuria, polydypsia or polyphagia, No significant Mental Stressors.  A full 10 point Review of Systems was done, except as stated above, all other Review of Systems were negative.    Social History:     History  Substance Use Topics  . Smoking status: Former Smoker -- 1.00 packs/day for 60 years    Types: Cigarettes    Quit date: 01/11/2011  . Smokeless tobacco: Never Used  . Alcohol Use: No    Lives - wheelchair-bound, needs feeding assistance, lives at SNF      Family History :     Family History  Problem Relation Age of Onset  . Esophageal cancer Father   . Cancer Father   . Breast cancer Sister   . Cancer Sister   . Colon cancer Sister   . Heart disease Sister   . Heart disease Mother   . Heart disease Son   . Parkinson's disease Sister     younger       Home Medications:   Prior to Admission medications   Medication Sig Start Date End Date Taking? Authorizing Provider  acetaminophen (TYLENOL) 500 MG tablet Take 500 mg by mouth 4 (four) times daily.    Yes Historical Provider, MD  albuterol (PROVENTIL HFA;VENTOLIN HFA) 108 (90 BASE) MCG/ACT inhaler Inhale 2 puffs into the lungs every 6 (six) hours as needed for wheezing or shortness of breath. 03/24/14  Yes Newt Lukes, MD  albuterol (PROVENTIL) (2.5 MG/3ML) 0.083% nebulizer solution Take 2.5 mg by nebulization every 8 (eight) hours as needed for wheezing.    Yes Historical Provider, MD  Calcium Carb-Cholecalciferol (CALCIUM-VITAMIN D) 600-400 MG-UNIT TABS Take 1 tablet by mouth daily.   Yes Historical Provider, MD  carvedilol (COREG) 6.25 MG tablet Take 6.25 mg by mouth 2 (two) times daily with a meal.   Yes Historical Provider, MD  Cholecalciferol (VITAMIN D3)  5000 UNITS CAPS Take 5,000 Units by mouth every other day.    Yes Historical Provider, MD  clonazePAM (KLONOPIN) 0.5 MG tablet Take 1 tablet (  0.5 mg total) by mouth every 12 (twelve) hours as needed for anxiety. 01/13/15  Yes Alison Murray, MD  dextromethorphan (DELSYM) 30 MG/5ML liquid Take 10 mLs by mouth at bedtime as needed for cough.   Yes Historical Provider, MD  divalproex (DEPAKOTE) 250 MG DR tablet Take 250 mg by mouth 3 (three) times daily.   Yes Historical Provider, MD  fexofenadine (ALLEGRA) 180 MG tablet Take 180 mg by mouth daily.   Yes Historical Provider, MD  fluticasone (FLONASE) 50 MCG/ACT nasal spray Place 1 spray into both nostrils daily. 04/15/14  Yes [name], MD  furosemide (LASIX) 20 MG tablet Take 20 mg by mouth daily.   Yes Historical Provider, MD  guaifenesin (ROBITUSSIN) 100 MG/5ML syrup Take 200 mg by mouth every 6 (six) hours as needed for cough.   Yes Historical Provider, MD  Infant Care Products (JOHNSONS BABY) SHAM Apply topically See admin instructions. Gently rub closed eye lids with diluted solutions of baby shampoo. Use a clean washcloth, cotton tipped applicator sticks, or cotton balls daily for 21 days.   Yes Historical Provider, MD  levothyroxine (SYNTHROID, LEVOTHROID) 200 MCG tablet Take 200 mcg by mouth daily before breakfast.   Yes Historical Provider, MD  loratadine (CLARITIN) 10 MG tablet Take 10 mg by mouth daily.   Yes Historical Provider, MD  losartan (COZAAR) 50 MG tablet Take 50 mg by mouth daily.   Yes Historical Provider, MD  magnesium hydroxide (MILK OF MAGNESIA) 400 MG/5ML suspension Take 30 mLs by mouth daily as needed for mild constipation.   Yes Historical Provider, MD  metFORMIN (GLUCOPHAGE) 500 MG tablet Take 500 mg by mouth daily with breakfast.   Yes Historical Provider, MD  Neomycin-Bacitracin-Polymyxin (TRIPLE ANTIBIOTIC EX) Apply 1 application topically 2 (two) times daily. Right Knee   Yes Historical Provider, MD  OLANZapine  (ZYPREXA) 10 MG tablet Take 1 tablet (10 mg total) by mouth daily. 01/13/15  Yes Alison Murray, MD  ondansetron (ZOFRAN) 4 MG tablet Take 1 tablet (4 mg total) by mouth every 6 (six) hours as needed for nausea. 01/13/15  Yes Alison Murray, MD  pantoprazole (PROTONIX) 40 MG tablet Take 40 mg by mouth daily.   Yes Historical Provider, MD  Polyethyl Glycol-Propyl Glycol (SYSTANE) 0.4-0.3 % SOLN Apply 1 drop to eye 2 (two) times daily.    Yes Historical Provider, MD  potassium chloride SA (K-DUR,KLOR-CON) 20 MEQ tablet Take 20 mEq by mouth daily.   Yes Historical Provider, MD  rivaroxaban (XARELTO) 20 MG TABS tablet Take 1 tablet (20 mg total) by mouth daily with supper. Patient taking differently: Take 20 mg by mouth daily.  01/13/15  Yes Alison Murray, MD  rivastigmine (EXELON) 4.6 mg/24hr Place 4.6 mg onto the skin every morning.   Yes Historical Provider, MD  traZODone (DESYREL) 150 MG tablet Take 1 tablet (150 mg total) by mouth at bedtime. 01/13/15  Yes Alison Murray, MD  cefUROXime (CEFTIN) 250 MG tablet Take 1 tablet (250 mg total) by mouth 2 (two) times daily with a meal. Patient not taking: Reported on 03/27/2015 03/05/15   Cathren Laine, MD  clindamycin (CLEOCIN) 150 MG capsule Take 1 capsule (150 mg total) by mouth 3 (three) times daily. Patient not taking: Reported on 03/05/2015 01/13/15   Alison Murray, MD  enoxaparin (LOVENOX) 80 MG/0.8ML injection Inject 0.8 mLs (80 mg total) into the skin every 12 (twelve) hours. Patient not taking: Reported on 03/05/2015 12/20/14   Penny Pia, MD  haloperidol (HALDOL) 0.5 MG tablet Take 1 tablet (0.5 mg total) by mouth 3 (three) times daily. 01/13/15   Alison Murray, MD  ipratropium-albuterol (DUONEB) 0.5-2.5 (3) MG/3ML SOLN Take 3 mLs by nebulization 3 (three) times daily. Patient not taking: Reported on 03/05/2015 08/18/14   Vassie Loll, MD  predniSONE (DELTASONE) 20 MG tablet Take 2 tablets (40 mg total) by mouth daily. Patient not taking: Reported on 03/27/2015  03/05/15   Cathren Laine, MD  warfarin (COUMADIN) 6 MG tablet Take 1 tablet (6 mg total) by mouth one time only at 6 PM. Patient not taking: Reported on 03/05/2015 12/20/14   Penny Pia, MD     Allergies:     Allergies  Allergen Reactions  . Bactrim [Sulfamethoxazole-Trimethoprim] Anaphylaxis  . Contrast Media [Iodinated Diagnostic Agents] Anaphylaxis and Swelling  . Iodine Anaphylaxis  . Iohexol Anaphylaxis, Shortness Of Breath and Swelling     Desc: PT STATES SHE HAD A SEVERE REACTION TO IV CONRAST WITH THROAT SWELLING AND SOB. SHE WAS ADMITTED TO THE HOSPITAL. SHE HAS NEVER HAD CONTRAST AGAIN.   Marland Kitchen Sulfa Antibiotics Anaphylaxis  . Aricept [Donepezil Hcl] Nausea And Vomiting  . Cymbalta [Duloxetine Hcl] Other (See Comments)    HALLUCINATIONS    . Delsym [Dextromethorphan] Other (See Comments)    Hallucination  . Guaifenesin & Derivatives Other (See Comments)    Hallucination  . Hydromet [Hydrocodone-Homatropine] Other (See Comments)    hallucinations  . Donepezil Other (See Comments)    Per mar  . Duloxetine Other (See Comments)    Per mar  . Iodine Other (See Comments)    Per mar  . Penicillins Other (See Comments)    Per mar  . Quinolones Other (See Comments)    Per mar  . Sodium Benzoate [Nutritional Supplements]     Per mar  . Sodium Benzoate [Nutritional Supplements] Other (See Comments)    Per mar  . Sulfa Antibiotics Other (See Comments)    Per mar  . Trimethoprim     From mar  . Trimethoprim Other (See Comments)    Per mar  . Avelox [Moxifloxacin Hcl In Nacl] Rash  . Ciprofloxacin Itching  . Gadolinium Derivatives Other (See Comments)    Unknown    . Penicillins Rash    Rash as a child  . Quinolones Rash     Physical Exam:   Vitals  Blood pressure 130/57, pulse 72, temperature 99 F (37.2 C), temperature source Oral, resp. rate 20, SpO2 100 %.   1. General elderly moderately built white female who is legally blind lying in hospital bed in no  distress,  2. Normal affect and insight, Not Suicidal or Homicidal, Awake , minimally verbal at baseline according to the husband, will follow basic commands, per husband does not talk much.  3. No F.N deficits, ALL C.Nerves Intact, baseline legally blind, moves all 4 extremities, Strength 5/5 all 4 extremities, Sensation intact all 4 extremities, Plantars down going. Some left-sided facial droop which according to the husband is chronic and gets worse when she is sick.  4. Ears and Eyes appear Normal, Conjunctivae clear, PERRLA. Moist Oral Mucosa.  5. Supple Neck, No JVD, No cervical lymphadenopathy appriciated, No Carotid Bruits.  6. Symmetrical Chest wall movement, Good air movement bilaterally, CTAB.  7. RRR, No Gallops, Rubs or Murmurs, No Parasternal Heave.  8. Positive Bowel Sounds, Abdomen Soft, No tenderness, No organomegaly appriciated,No rebound -guarding or rigidity.  9.  No Cyanosis, Normal Skin Turgor, No Skin  Rash or Bruise. 2+ edema in both legs left leg more than right, left leg is red and warm below the knee  10. Good muscle tone,  joints appear normal , no effusions, Normal ROM.  11. No Palpable Lymph Nodes in Neck or Axillae      Data Review:    CBC  Recent Labs Lab 04/26/15 1040  WBC 19.9*  HGB 9.4*  HCT 31.4*  PLT 364  MCV 95.4  MCH 28.6  MCHC 29.9*  RDW 18.6*  LYMPHSABS 3.2  MONOABS 2.0*  EOSABS 0.1  BASOSABS 0.1   ------------------------------------------------------------------------------------------------------------------  Chemistries   Recent Labs Lab 04/26/15 1040  NA 138  K 4.2  CL 106  CO2 25  GLUCOSE 154*  BUN 10  CREATININE 0.85  CALCIUM 9.2  AST 31  ALT 12*  ALKPHOS 49  BILITOT 0.3   ------------------------------------------------------------------------------------------------------------------ CrCl cannot be calculated (Unknown ideal  weight.). ------------------------------------------------------------------------------------------------------------------ No results for input(s): TSH, T4TOTAL, T3FREE, THYROIDAB in the last 72 hours.  Invalid input(s): FREET3   Coagulation profile  Recent Labs Lab 04/26/15 1040  INR 1.22   ------------------------------------------------------------------------------------------------------------------- No results for input(s): DDIMER in the last 72 hours. -------------------------------------------------------------------------------------------------------------------  Cardiac Enzymes  Recent Labs Lab 04/26/15 1039  TROPONINI <0.03   ------------------------------------------------------------------------------------------------------------------ Invalid input(s): POCBNP   ---------------------------------------------------------------------------------------------------------------  Urinalysis    Component Value Date/Time   COLORURINE YELLOW 04/26/2015 1037   APPEARANCEUR CLOUDY* 04/26/2015 1037   LABSPEC 1.019 04/26/2015 1037   PHURINE 7.0 04/26/2015 1037   GLUCOSEU NEGATIVE 04/26/2015 1037   GLUCOSEU NEGATIVE 03/09/2014 1610   HGBUR NEGATIVE 04/26/2015 1037   BILIRUBINUR NEGATIVE 04/26/2015 1037   BILIRUBINUR neg 06/06/2014 1626   KETONESUR NEGATIVE 04/26/2015 1037   PROTEINUR NEGATIVE 04/26/2015 1037   PROTEINUR neg 06/06/2014 1626   UROBILINOGEN 0.2 04/26/2015 1037   UROBILINOGEN 0.2 06/06/2014 1626   NITRITE NEGATIVE 04/26/2015 1037   NITRITE neg 06/06/2014 1626   LEUKOCYTESUR NEGATIVE 04/26/2015 1037    ----------------------------------------------------------------------------------------------------------------   Imaging Results:    Ct Head Wo Contrast  04/26/2015   CLINICAL DATA:  Fall yesterday. Headache. Confusion. Weakness. Dementia.  EXAM: CT HEAD WITHOUT CONTRAST  TECHNIQUE: Contiguous axial images were obtained from the base of the  skull through the vertex without intravenous contrast.  COMPARISON:  03/27/2015  FINDINGS: There is no evidence of intracranial hemorrhage, brain edema, or other signs of acute infarction. There is no evidence of intracranial mass lesion or mass effect. No abnormal extraaxial fluid collections are identified.  Mild diffuse cerebral atrophy and moderate to severe chronic small vessel disease are unchanged in appearance. Ventricles are stable in size. No skull abnormality identified. Near complete resolution of opacification of ethmoid and sphenoid sinuses noted since prior study.  IMPRESSION: No acute intracranial findings.  Stable cerebral atrophy and chronic small vessel disease.   Electronically Signed   By: Myles Rosenthal M.D.   On: 04/26/2015 12:22   Dg Knee Complete 4 Views Left  04/26/2015   CLINICAL DATA:  Fall yesterday with left knee pain and abrasions anteriorly. Initial encounter.  EXAM: LEFT KNEE - COMPLETE 4+ VIEW  COMPARISON:  03/27/2015  FINDINGS: No acute fracture or dislocation is identified. There is likely a small knee joint effusion. Mild-to-moderate joint space narrowing is again seen in the medial compartment with associated marginal osteophytosis, not significantly changed. No lytic or blastic osseous lesion is identified. Vascular calcifications are noted.  IMPRESSION: No acute osseous abnormality identified. Small knee joint effusion and medial compartment osteoarthrosis.  Electronically Signed   By: Sebastian Ache   On: 04/26/2015 11:33    My personal review of EKG: Rhythm NSR,  Non specific ST changes   Assessment & Plan:      1. Left leg cellulitis. Admit, blood cultures, IV vancomycin to be dosed by pharmacy. Repeat CBC in the morning. If clinically not improved and Zosyn as she is diabetic.   2. History of lower extremity DVT. On xaralto, continue, obtain lower extremity venous duplex. If evidence of new DVT will switch anticoagulation.   3. Acute on chronic Chronic  diastolic dysfunction EF 55% on echogram in 2015. Mild decompensation. Placed on IV Lasix monitor BMP. Monitor leg edema.   4. DM type II. Check A1c, sliding scale for now. Hold Glucophage.   5.generalized deconditioning weakness, wheelchair/bed bound status. Legally blind. Supportive care. We'll have PT and speech evaluate, she appears to be high risk for aspiration. Feeding assistance aspiration precautions.    6. History of TIA. Continue anticoagulation, supportive care.   7. Dementia. Advanced. At risk for delirium. Minimize narcotics and benzodiazepine's.   8. Hypothyroidism. Check TSH continue home dose Synthroid.    Have requested pharmacy to redo med rec as most of the medications are duplicate and some of them appear to be wrong .    DVT Prophylaxis Xaralto  AM Labs Ordered, also please review Full Orders  Family Communication: Admission, patients condition and plan of care including tests being ordered have been discussed with the patient and husband who indicate understanding and agree with the plan and Code Status.  Code Status DNR  Likely DC to  SNF  Condition GUARDED     Time spent in minutes : 35    SINGH,PRASHANT K M.D on 04/26/2015 at 12:59 PM  Between 7am to 7pm - Pager - [phone]  After 7pm go to [email or web address] - password [facility]  [facility]  Office  [phone]

## 2015-04-26 NOTE — ED Notes (Addendum)
Pt from [facility], per staff pt co gl weakness . Pt is alert and confused to her norm. Hx dementia per ems no neuro deficit. Pt presents with bilateral edema upper and lower extremities. Per pt had fall yesterday . Pt co right arm pain and headache

## 2015-04-26 NOTE — Progress Notes (Signed)
ANTICOAGULATION CONSULT NOTE - Initial Consult  Pharmacy Consult for Xarelto Indication: Hx DVT  Allergies  Allergen Reactions  . Bactrim [Sulfamethoxazole-Trimethoprim] Anaphylaxis  . Contrast Media [Iodinated Diagnostic Agents] Anaphylaxis and Swelling  . Iodine Anaphylaxis  . Iohexol Anaphylaxis, Shortness Of Breath and Swelling     Desc: PT STATES SHE HAD A SEVERE REACTION TO IV CONRAST WITH THROAT SWELLING AND SOB. SHE WAS ADMITTED TO THE HOSPITAL. SHE HAS NEVER HAD CONTRAST AGAIN.   Marland Kitchen Sulfa Antibiotics Anaphylaxis  . Aricept [Donepezil Hcl] Nausea And Vomiting  . Cymbalta [Duloxetine Hcl] Other (See Comments)    HALLUCINATIONS    . Delsym [Dextromethorphan] Other (See Comments)    Hallucination  . Guaifenesin & Derivatives Other (See Comments)    Hallucination  . Hydromet [Hydrocodone-Homatropine] Other (See Comments)    hallucinations  . Donepezil Other (See Comments)    Per mar  . Duloxetine Other (See Comments)    Per mar  . Iodine Other (See Comments)    Per mar  . Penicillins Other (See Comments)    Per mar  . Quinolones Other (See Comments)    Per mar  . Sodium Benzoate [Nutritional Supplements]     Per mar  . Sodium Benzoate [Nutritional Supplements] Other (See Comments)    Per mar  . Sulfa Antibiotics Other (See Comments)    Per mar  . Trimethoprim     From mar  . Trimethoprim Other (See Comments)    Per mar  . Avelox [Moxifloxacin Hcl In Nacl] Rash  . Ciprofloxacin Itching  . Gadolinium Derivatives Other (See Comments)    Unknown    . Penicillins Rash    Rash as a child  . Quinolones Rash    Patient Measurements: Weight: 175 lb (79.379 kg)  Vital Signs: Temp: 99 F (37.2 C) (06/15 1257) Temp Source: Oral (06/15 1234) BP: 130/57 mmHg (06/15 1234) Pulse Rate: 72 (06/15 1234)  Labs:  Recent Labs  04/26/15 1039 04/26/15 1040  HGB  --  9.4*  HCT  --  31.4*  PLT  --  364  APTT  --  42*  LABPROT  --  15.6*  INR  --  1.22  CREATININE   --  0.85  TROPONINI <0.03  --     Estimated Creatinine Clearance: 49.5 mL/min (by C-G formula based on Cr of 0.85).   Medical History: Past Medical History  Diagnosis Date  . Depression   . Ejection fraction     EF 40%, echo, November, 2012  / in improved, EF 60%, echo, December, 2012  . Contrast media allergy     Patient feels poorly with contrast  . Status post AAA (abdominal aortic aneurysm) repair     Surgical repair, Dr. Hart [place], December 27, 2011  . Pre-syncope     vasovagal w/ heart block  . Parotid mass     has refused further eval 10/2011  . Anxiety   . Facial tic     L sided spasms - Botox trial summer 2013, ?effective  . ARMD (age related macular degeneration) 06/2014    bilateral vision loss - legally blind  . AAA (abdominal aortic aneurysm)     s/p repair 12/2011  . Congestive heart failure     NL EF 10/2011 echo   . Diabetes mellitus type II, controlled   . Hypothyroidism   . Sinus bradycardia 09/19/2011    Occurring simultaneously with complete heart block   . COPD (chronic obstructive pulmonary disease)   .  Facial spasm     L hemifacial-treated w/ botox xeomin -Dr Terrace Arabia  . Dementia     MRI with progressive chronic microvascular ischemia and generalized cerebral atrophy  . Lumbosacral spondylosis without myelopathy   . Legally blind 06/2014    due to ARMD  . DVT (deep venous thrombosis)   . Hypertension   . Diabetes mellitus without complication   . CHF (congestive heart failure)   . GERD (gastroesophageal reflux disease)     Medications:  Scheduled:  Infusions:  PRN:   Assessment: 81 y.o. female, history of dementia, legally blind, diabetes mellitus type 2, history of DVT on xarelto, TIA, chronic diastolic dysfunction, hypothyroidism, essential hypertension who presents to the ED with weakness and leg redness and swelling.   Husband reports patient has chronic left-sided facial droop which becomes more prominent with acute illness.  Head CT was  obtained to r/o stroke which was unremarkable.  Pharmacy is consulted to continue Xarelto dosing upon admission.  H/H low but consistent with previous results; plts wnl. No bleeding reported per notes.  CrCl > 30 ml/min  Goal of Therapy:  Therapeutic anticoagulation Monitor platelets by anticoagulation protocol: Yes   Plan:   Continue Xarelto  daily - next dose due 6/16  Monitor renal function and adjust dose if needed  Watch for signs/symptoms of bleeding  Loralee Pacas, PharmD, BCPS Pager: [phone] 04/26/2015,1:04 PM

## 2015-04-26 NOTE — ED Notes (Signed)
Paula [name], husband , # [id number]

## 2015-04-26 NOTE — Progress Notes (Signed)
ANTIBIOTIC CONSULT NOTE - INITIAL  Pharmacy Consult for Vancomycin Indication: Cellulitis  Allergies  Allergen Reactions  . Bactrim [Sulfamethoxazole-Trimethoprim] Anaphylaxis  . Contrast Media [Iodinated Diagnostic Agents] Anaphylaxis and Swelling  . Iodine Anaphylaxis  . Iohexol Anaphylaxis, Shortness Of Breath and Swelling     Desc: PT STATES SHE HAD A SEVERE REACTION TO IV CONRAST WITH THROAT SWELLING AND SOB. SHE WAS ADMITTED TO THE HOSPITAL. SHE HAS NEVER HAD CONTRAST AGAIN.   Marland Kitchen Sulfa Antibiotics Anaphylaxis  . Aricept [Donepezil Hcl] Nausea And Vomiting  . Cymbalta [Duloxetine Hcl] Other (See Comments)    HALLUCINATIONS    . Delsym [Dextromethorphan] Other (See Comments)    Hallucination  . Guaifenesin & Derivatives Other (See Comments)    Hallucination  . Hydromet [Hydrocodone-Homatropine] Other (See Comments)    hallucinations  . Donepezil Other (See Comments)    Per mar  . Duloxetine Other (See Comments)    Per mar  . Iodine Other (See Comments)    Per mar  . Penicillins Other (See Comments)    Per mar  . Quinolones Other (See Comments)    Per mar  . Sodium Benzoate [Nutritional Supplements]     Per mar  . Sodium Benzoate [Nutritional Supplements] Other (See Comments)    Per mar  . Sulfa Antibiotics Other (See Comments)    Per mar  . Trimethoprim     From mar  . Trimethoprim Other (See Comments)    Per mar  . Avelox [Moxifloxacin Hcl In Nacl] Rash  . Ciprofloxacin Itching  . Gadolinium Derivatives Other (See Comments)    Unknown    . Penicillins Rash    Rash as a child  . Quinolones Rash    Patient Measurements: Weight: 175 lb (79.379 kg)  Vital Signs: Temp: 99 F (37.2 C) (06/15 1257) Temp Source: Oral (06/15 1234) BP: 130/57 mmHg (06/15 1234) Pulse Rate: 72 (06/15 1234) Intake/Output from previous day:   Intake/Output from this shift:    Labs:  Recent Labs  04/26/15 1040  WBC 19.9*  HGB 9.4*  PLT 364  CREATININE 0.85    Estimated Creatinine Clearance: 49.5 mL/min (by C-G formula based on Cr of 0.85). No results for input(s): VANCOTROUGH, VANCOPEAK, VANCORANDOM, GENTTROUGH, GENTPEAK, GENTRANDOM, TOBRATROUGH, TOBRAPEAK, TOBRARND, AMIKACINPEAK, AMIKACINTROU, AMIKACIN in the last 72 hours.   Microbiology: No results found for this or any previous visit (from the past 720 hour(s)).  Medical History: Past Medical History  Diagnosis Date  . Depression   . Ejection fraction     EF 40%, echo, November, 2012  / in improved, EF 60%, echo, December, 2012  . Contrast media allergy     Patient feels poorly with contrast  . Status post AAA (abdominal aortic aneurysm) repair     Surgical repair, Dr. Hart [place], December 27, 2011  . Pre-syncope     vasovagal w/ heart block  . Parotid mass     has refused further eval 10/2011  . Anxiety   . Facial tic     L sided spasms - Botox trial summer 2013, ?effective  . ARMD (age related macular degeneration) 06/2014    bilateral vision loss - legally blind  . AAA (abdominal aortic aneurysm)     s/p repair 12/2011  . Congestive heart failure     NL EF 10/2011 echo   . Diabetes mellitus type II, controlled   . Hypothyroidism   . Sinus bradycardia 09/19/2011    Occurring simultaneously with complete heart block   .  COPD (chronic obstructive pulmonary disease)   . Facial spasm     L hemifacial-treated w/ botox xeomin -Dr Terrace Arabia  . Dementia     MRI with progressive chronic microvascular ischemia and generalized cerebral atrophy  . Lumbosacral spondylosis without myelopathy   . Legally blind 06/2014    due to ARMD  . DVT (deep venous thrombosis)   . Hypertension   . Diabetes mellitus without complication   . CHF (congestive heart failure)   . GERD (gastroesophageal reflux disease)     Medications:   (Not in a hospital admission) See med rec  Assessment: 81 year-old female with bilateral leg cellulitis. Pharmacy consulted to dose vancomycin given multiple  allergies.  Tm 99, WBC elevated 19.9k, SCr wnl, CrCl ~50 ml/min, blood cx sent  Goal of Therapy:  Vancomycin trough level 10-15 mcg/ml  Plan:   Vancomycin  IV q24h Check trough at steady state Follow up renal function & cultures, clinical course  Loralee Pacas, PharmD, BCPS Pager: [phone] 04/26/2015,1:16 PM

## 2015-04-27 DIAGNOSIS — I5033 Acute on chronic diastolic (congestive) heart failure: Secondary | ICD-10-CM | POA: Insufficient documentation

## 2015-04-27 DIAGNOSIS — E039 Hypothyroidism, unspecified: Secondary | ICD-10-CM

## 2015-04-27 DIAGNOSIS — E119 Type 2 diabetes mellitus without complications: Secondary | ICD-10-CM

## 2015-04-27 DIAGNOSIS — L03116 Cellulitis of left lower limb: Principal | ICD-10-CM

## 2015-04-27 LAB — RETICULOCYTES
RBC.: 2.76 MIL/uL — ABNORMAL LOW (ref 3.87–5.11)
Retic Count, Absolute: 38.6 10*3/uL (ref 19.0–186.0)
Retic Ct Pct: 1.4 % (ref 0.4–3.1)

## 2015-04-27 LAB — HEMOGLOBIN A1C
HEMOGLOBIN A1C: 5.5 % (ref 4.8–5.6)
MEAN PLASMA GLUCOSE: 111 mg/dL

## 2015-04-27 LAB — BASIC METABOLIC PANEL
Anion gap: 7 (ref 5–15)
BUN: 10 mg/dL (ref 6–20)
CHLORIDE: 103 mmol/L (ref 101–111)
CO2: 30 mmol/L (ref 22–32)
Calcium: 9 mg/dL (ref 8.9–10.3)
Creatinine, Ser: 0.83 mg/dL (ref 0.44–1.00)
GFR calc non Af Amer: >60 mL/min (ref >60)
GLUCOSE: 94 mg/dL (ref 65–99)
Potassium: 3.4 mmol/L — ABNORMAL LOW (ref 3.5–5.1)
SODIUM: 140 mmol/L (ref 135–145)

## 2015-04-27 LAB — GLUCOSE, CAPILLARY
GLUCOSE-CAPILLARY: 93 mg/dL (ref 65–99)
GLUCOSE-CAPILLARY: 116 mg/dL — AB (ref 65–99)
Glucose-Capillary: 110 mg/dL — ABNORMAL HIGH (ref 65–99)
Glucose-Capillary: 119 mg/dL — ABNORMAL HIGH (ref 65–99)

## 2015-04-27 LAB — CBC
HCT: 25.5 % — ABNORMAL LOW (ref 36.0–46.0)
HEMOGLOBIN: 7.7 g/dL — AB (ref 12.0–15.0)
MCH: 28.9 pg (ref 26.0–34.0)
MCHC: 30.2 g/dL (ref 30.0–36.0)
MCV: 95.9 fL (ref 78.0–100.0)
Platelets: 280 10*3/uL (ref 150–400)
RBC: 2.66 MIL/uL — AB (ref 3.87–5.11)
RDW: 19.1 % — AB (ref 11.5–15.5)
WBC: 8.7 10*3/uL (ref 4.0–10.5)

## 2015-04-27 LAB — INFLUENZA PANEL BY PCR (TYPE A & B)
H1N1 flu by pcr: NOT DETECTED
Influenza A By PCR: NEGATIVE
Influenza B By PCR: NEGATIVE

## 2015-04-27 LAB — HEMOGLOBIN AND HEMATOCRIT, BLOOD
HCT: 26.6 % — ABNORMAL LOW (ref 36.0–46.0)
HCT: 26.6 % — ABNORMAL LOW (ref 36.0–46.0)
HEMOGLOBIN: 8.3 g/dL — AB (ref 12.0–15.0)
Hemoglobin: 8.3 g/dL — ABNORMAL LOW (ref 12.0–15.0)

## 2015-04-27 LAB — IRON AND TIBC
Iron: 51 ug/dL (ref 28–170)
Saturation Ratios: 16 % (ref 10.4–31.8)
TIBC: 315 ug/dL (ref 250–450)
UIBC: 264 ug/dL

## 2015-04-27 LAB — FOLATE: Folate: 13.6 ng/mL (ref >5.9)

## 2015-04-27 LAB — URIC ACID: Uric Acid, Serum: 5.2 mg/dL (ref 2.3–6.6)

## 2015-04-27 LAB — FERRITIN: FERRITIN: 20 ng/mL (ref 11–307)

## 2015-04-27 LAB — VITAMIN B12: Vitamin B-12: 632 pg/mL (ref 180–914)

## 2015-04-27 MED ORDER — POTASSIUM CHLORIDE CRYS ER 20 MEQ PO TBCR
40.0000 meq | EXTENDED_RELEASE_TABLET | Freq: Once | ORAL | Status: AC
  Administered 2015-04-27: 40 meq via ORAL
  Filled 2015-04-27: qty 2

## 2015-04-27 MED ORDER — ACETAMINOPHEN 325 MG PO TABS
650.0000 mg | ORAL_TABLET | ORAL | Status: DC | PRN
  Administered 2015-04-27 – 2015-04-29 (×3): 650 mg via ORAL
  Filled 2015-04-27 (×4): qty 2

## 2015-04-27 MED ORDER — IBUPROFEN 200 MG PO TABS: 400.0000 mg | ORAL_TABLET | Freq: Four times a day (QID) | ORAL | Status: DC | PRN

## 2015-04-27 NOTE — Clinical Social Work Note (Signed)
Clinical Social Work Assessment  Patient Details  Name: Paula [name] MRN: [id number] Date of Birth: 18-Aug-1927  Date of referral:  04/27/15               Reason for consult:  Discharge Planning                Permission sought to share information with:  Family Supports Permission granted to share information::  Yes, Verbal Permission Granted  Name::     Paula [name]  Agency::     Relationship::  husband  Contact Information:  [phone]  Housing/Transportation Living arrangements for the past 2 months:  Skilled Nursing Facility Source of Information:  Patient, Spouse Patient Interpreter Needed:  None Criminal Activity/Legal Involvement Pertinent to Current Situation/Hospitalization:  No - Comment as needed Significant Relationships:  Spouse Lives with:  Facility Resident Do you feel safe going back to the place where you live?  No Need for family participation in patient care:  Yes (Comment)  Care giving concerns:  CSW received referral that pt admitted from [facility]. PT recommending SNF unless ALF can continue to meet pt needs.   Social Worker assessment / plan:  CSW received referral that pt admitted from [facility].   CSW contacted [facility] to gather further information about pt prior level of functioning. Per Tammy at [facility], Pt needed limited assist with transfers and was able to ambulate with walker, but did not usually like to ambulate with her walker. CSW discussed with RN, Tammy at ALF regarding PT recommendations. Per Tammy at [facility], ALF feels that pt needs to go to SNF to get rehabilitation before returning to [facility].   CSW visited pt room. CSW introduced self to pt and explained role. Per chart, pt with dementia. Pt reports that pt husband, Paula [name] is primary support and provided permission for CSW to contact pt husband via telephone. CSW contacted pt husband, Paula [name] via telephone. CSW introduced self and  explained role. Pt husband, Paula [name] confirmed that pt admitted from [facility]. CSW updated pt husband regarding that ALF feels that pt needs SNF prior to returning to [facility]. Pt husband discussed that he was not surprised that pt needed higher level of care as pt has not been able to do much as far as ambulations and transfers at the ALF. Pt husband reports that pt has been to [facility] in the past and that is pt husband preference for pt to return there.   CSW completed FL2 and initiated SNF search to [facility]. CSW contacted [facility] and notified facility. [facility] confirmed that facility has bed available for pt whenever pt medically ready for discharge.   CSW updated pt at bedside and pt agreeable to [facility].   CSW to continue to follow to provide support and assist with pt disposition needs to [facility] when pt medically ready for discharge.    Employment status:  Retired Health and safety inspector:  Medicare PT Recommendations:  Skilled Nursing Facility Information / Referral to community resources:  Skilled Nursing Facility  Patient/Family's Response to care:  Pt alert and oriented x 3. Pt pleasant. Pt husband supportive and actively involved in pt care and expressed his disappointment that he couldn't be at bedside as he is not feeling well and did not want to be around pt while not feeling well. Pt and pt husband feel comfortable with plan for [facility] upon  discharge.  Patient/Family's Understanding of and Emotional Response to Diagnosis, Current Treatment, and Prognosis:  Pt husband has good understanding of diagnosis and treatment plan. Pt insight to medical condition is limited secondary to dementia. Pt and pt husband aware of need for higher level of care upon discharge.   Emotional Assessment Appearance:  Appears stated age Attitude/Demeanor/Rapport:   Other (pt appropriate) Affect (typically observed):  Accepting, Pleasant Orientation:  Oriented to Self, Oriented to Place, Oriented to Situation Alcohol / Substance use:  Not Applicable Psych involvement (Current and /or in the community):  No (Comment)  Discharge Needs  Concerns to be addressed:  Discharge Planning Concerns Readmission within the last 30 days:  No Current discharge risk:  None Barriers to Discharge:  Continued Medical Work up   Loletta Specter A, LCSW 04/27/2015, 2:56 PM  [phone]

## 2015-04-27 NOTE — Clinical Social Work Placement (Signed)
   CLINICAL SOCIAL WORK PLACEMENT  NOTE  Date:  04/27/2015  Patient Details  Name: Paula [name] MRN: [id number] Date of Birth: 1927/04/20  Clinical Social Work is seeking post-discharge placement for this patient at the Skilled  Nursing Facility level of care (*CSW will initial, date and re-position this form in  chart as items are completed):  No (pt husband specifically requesting [facility])   Patient/family provided with [facility] Clinical Social Work Department's list of facilities offering this level of care within the geographic area requested by the patient (or if unable, by the patient's family).  No   Patient/family informed of their freedom to choose among providers that offer the needed level of care, that participate in Medicare, Medicaid or managed care program needed by the patient, have an available bed and are willing to accept the patient.  No   Patient/family informed of [facility]'s ownership interest in [facility] and [facility], as well as of the fact that they are under no obligation to receive care at these facilities.  PASRR submitted to EDS on 04/27/15     PASRR number received on 04/27/15     Existing PASRR number confirmed on       FL2 transmitted to all facilities in geographic area requested by pt/family on 04/27/15     FL2 transmitted to all facilities within larger geographic area on 04/27/15     Patient informed that his/her managed care company has contracts with or will negotiate with certain facilities, including the following:        Yes   Patient/family informed of bed offers received.  Patient chooses bed at [facility]     Physician recommends and patient chooses bed at      Patient to be transferred to   on  .  Patient to be transferred to facility by       Patient family notified on   of transfer.  Name of family member notified:        PHYSICIAN Please sign FL2     Additional Comment:     _______________________________________________ Orson Eva, LCSW 04/27/2015, 3:06 PM

## 2015-04-27 NOTE — Progress Notes (Signed)
[facility] PROGRESS NOTE  Paula [name] BZJ:[id number] DOB: 01/13/1927 DOA: 04/26/2015 PCP: [name], MD  Assessment/Plan: #1 left lower extremity cellulitis Clinical improvement. Patient is currently afebrile. WBC trending down. Continue empiric IV vancomycin. Hopefully will transition to oral antibiotics tomorrow. Keep lower extremity elevated. Follow.  #2 anemia Likely anemia of chronic disease. Check an anemia panel. Patient with no overt bleeding. Check FOBT. Follow H&H. Transfusion threshold hemoglobin less than 7.  #3 hypokalemia Likely secondary to diureses. Repleted.  #4 acute on chronic diastolic CHF exacerbation Improving clinically. 2-D echo with EF of 55% from 2015. I/O equal -1.26 L over the past 24 hours. Patient's current weight is 72.8 kg from 74.8 kg from 79.38 kg. Continue IV Lasix for now. Follow.  #5 well-controlled diabetes mellitus type 2 Continue to hold oral hypoglycemic agents. Hemoglobin A1c is 5.5. CBGs ranged from 91-107. Continue sliding scale insulin.  #6 generalized deconditioning/weakness Patient is wheelchair/bed bound status. Patient is legally blind. PT evaluation pending. Patient has been seen by speech and patient placed on a regular diet.  #7 History of TIA Continue anticoagulation.  #8 dementia Stable.  #9 hypothyroidism TSH within normal limits. Continue home dose Synthroid.  #10 prophylaxis On xarelto for DVT prophylaxis.   Code Status: DO NOT RESUSCITATE Family Communication: No family at bedside. Disposition Plan: Back to skilled nursing facility when medically stable.   Consultants:  None  Procedures:  X-ray of the knee 04/26/2015  Bilateral lower extremity Doppler 04/26/2015  Antibiotics:  IV vancomycin 04/26/2015  HPI/Subjective: Patient denies any chest pain. Patient denies any shortness of breath. No abdominal pain. Patient states legs are feeling better. patient denies any bleeding.    Objective: Filed Vitals:   04/27/15 0916  BP:   Pulse: 71  Temp:   Resp: 20    Intake/Output Summary (Last 24 hours) at 04/27/15 1058 Last data filed at 04/27/15 1041  Gross per 24 hour  Intake    240 ml  Output   1500 ml  Net  -1260 ml   Filed Weights   04/26/15 1259 04/26/15 1356 04/27/15 0818  Weight: 79.379 kg (175 lb) 74.8 kg (164 lb 14.5 oz) 72.8 kg (160 lb 7.9 oz)    Exam:   General:  NAD  Cardiovascular: RRR  Respiratory: CTAB anterior lung fields  Abdomen: Soft, nontender, nondistended, positive bowel sounds.   Musculoskeletal: No clubbing or cyanosis. 1-2+ bilateral lower extremity edema left greater than right. Left lower extremity with decreased erythema, decreased warmth, some tenderness to palpation.  Data Reviewed: Basic Metabolic Panel:  Recent Labs Lab 04/26/15 1040 04/27/15 0400  NA 138 140  K 4.2 3.4*  CL 106 103  CO2 25 30  GLUCOSE 154* 94  BUN 10 10  CREATININE 0.85 0.83  CALCIUM 9.2 9.0   Liver Function Tests:  Recent Labs Lab 04/26/15 1040  AST 31  ALT 12*  ALKPHOS 49  BILITOT 0.3  PROT 6.1*  ALBUMIN 3.7   No results for input(s): LIPASE, AMYLASE in the last 168 hours. No results for input(s): AMMONIA in the last 168 hours. CBC:  Recent Labs Lab 04/26/15 1040 04/27/15 0400  WBC 19.9* 8.7  NEUTROABS 14.7*  --   HGB 9.4* 7.7*  HCT 31.4* 25.5*  MCV 95.4 95.9  PLT 364 280   Cardiac Enzymes:  Recent Labs Lab 04/26/15 1039  TROPONINI <0.03   BNP (last 3 results)  Recent Labs  11/29/14 1805 03/05/15 0753  BNP 31.8 39.3  ProBNP (last 3 results)  Recent Labs  08/15/14 1735  PROBNP 171.4    CBG:  Recent Labs Lab 04/26/15 1836 04/27/15 0753  GLUCAP 91 93    Recent Results (from the past 240 hour(s))  Culture, blood (routine x 2)     Status: None (Preliminary result)   Collection Time: 04/26/15  1:05 PM  Result Value Ref Range Status   Specimen Description BLOOD RIGHT ANTECUBITAL  Final    Special Requests BOTTLES DRAWN AEROBIC AND ANAEROBIC 5CC  Final   Culture   Final    NO GROWTH < 24 HOURS Performed at [facility]    Report Status PENDING  Incomplete  Culture, blood (routine x 2)     Status: None (Preliminary result)   Collection Time: 04/26/15  4:10 PM  Result Value Ref Range Status   Specimen Description BLOOD LEFT ARM  Final   Special Requests BOTTLES DRAWN AEROBIC AND ANAEROBIC 10CC  Final   Culture   Final    NO GROWTH < 24 HOURS Performed at [facility]    Report Status PENDING  Incomplete     Studies: Ct Head Wo Contrast  04/26/2015   CLINICAL DATA:  Fall yesterday. Headache. Confusion. Weakness. Dementia.  EXAM: CT HEAD WITHOUT CONTRAST  TECHNIQUE: Contiguous axial images were obtained from the base of the skull through the vertex without intravenous contrast.  COMPARISON:  03/27/2015  FINDINGS: There is no evidence of intracranial hemorrhage, brain edema, or other signs of acute infarction. There is no evidence of intracranial mass lesion or mass effect. No abnormal extraaxial fluid collections are identified.  Mild diffuse cerebral atrophy and moderate to severe chronic small vessel disease are unchanged in appearance. Ventricles are stable in size. No skull abnormality identified. Near complete resolution of opacification of ethmoid and sphenoid sinuses noted since prior study.  IMPRESSION: No acute intracranial findings.  Stable cerebral atrophy and chronic small vessel disease.   Electronically Signed   By: Myles Rosenthal M.D.   On: 04/26/2015 12:22   Dg Knee Complete 4 Views Left  04/26/2015   CLINICAL DATA:  Fall yesterday with left knee pain and abrasions anteriorly. Initial encounter.  EXAM: LEFT KNEE - COMPLETE 4+ VIEW  COMPARISON:  03/27/2015  FINDINGS: No acute fracture or dislocation is identified. There is likely a small knee joint effusion. Mild-to-moderate joint space narrowing is again seen in the medial compartment with associated  marginal osteophytosis, not significantly changed. No lytic or blastic osseous lesion is identified. Vascular calcifications are noted.  IMPRESSION: No acute osseous abnormality identified. Small knee joint effusion and medial compartment osteoarthrosis.   Electronically Signed   By: Sebastian Ache   On: 04/26/2015 11:33    Scheduled Meds: . carvedilol  6.25 mg Oral BID WC  . clonazePAM  0.5 mg Oral QHS  . furosemide  40 mg Intravenous BID  . insulin aspart  0-5 Units Subcutaneous QHS  . insulin aspart  0-9 Units Subcutaneous TID WC  . ipratropium-albuterol  3 mL Nebulization TID  . levothyroxine  200 mcg Oral QAC breakfast  . OLANZapine  10 mg Oral Daily  . pantoprazole  40 mg Oral Daily  . rivaroxaban  20 mg Oral QAC breakfast  . rivastigmine  4.6 mg Transdermal q morning - 10a  . sodium chloride  3 mL Intravenous Q12H  . valproic acid  250 mg Oral TID  . vancomycin  1,250 mg Intravenous Q24H   Continuous Infusions:   Principal Problem:  Cellulitis of left leg Active Problems:   Hypertension   Dementia arising in the senium and presenium   TIA (transient ischemic attack)   Diabetes mellitus type 2, controlled   DVT (deep venous thrombosis)   Hypothyroidism   Diastolic CHF, chronic   Cellulitis    Time spent: 35 minutes    THOMPSON,DANIEL M.D. [facility] Pager [phone]. If 7PM-7AM, please contact night-coverage at [email or web address], password [facility] 04/27/2015, 10:58 AM  LOS: 1 day

## 2015-04-27 NOTE — Evaluation (Signed)
Physical Therapy Evaluation Patient Details Name: Paula [name] MRN: [id number] DOB: 10-23-1927 Today's Date: 04/27/2015   History of Present Illness  81 yo female admitted wtih L LE cellulitis, fall. Hx of dementia, COPD, legally blind, CHF, AAA, HTN, DVT, DM, TIA, chronic L facial droop. Pt is from ALF  Clinical Impression  On eval, pt required Total assist +2 for mobility. Sat EOB ~3 minutes with Mod assist to maintain sitting balance. Multimodal cues for participation. Poor motivation and effort from pt. Assisted pt back to supine at her request.     Follow Up Recommendations SNF;Supervision/Assistance - 24 hour (unless ALF can continue to provide care)    Equipment Recommendations  None recommended by PT    Recommendations for Other Services       Precautions / Restrictions Precautions Precautions: Fall Restrictions Weight Bearing Restrictions: No      Mobility  Bed Mobility Overal bed mobility: Needs Assistance Bed Mobility: Supine to Sit;Sit to Supine     Supine to sit: Total assist;+2 for physical assistance;+2 for safety/equipment;HOB elevated Sit to supine: Total assist;+2 for physical assistance;+2 for safety/equipment;HOB elevated   General bed mobility comments: Multimodal cues for pt to put forth effort/initiate task. Utilized bedpad for scooting, positioning. Assist for trunk and bil LEs. Increased time.   Transfers                 General transfer comment: NT-pt unable and unwilling  Ambulation/Gait                Stairs            Wheelchair Mobility    Modified Rankin (Stroke Patients Only)       Balance Overall balance assessment: Needs assistance Sitting-balance support: Bilateral upper extremity supported;Feet supported Sitting balance-Leahy Scale: Poor Sitting balance - Comments: Mod assist for static sitting. LOB posteriorly repeatedly. Poor motivation/participaton from pt.                                      Pertinent Vitals/Pain Pain Assessment: Faces Faces Pain Scale: Hurts little more Pain Location: bil shoulders, knees, stomach Pain Intervention(s): Limited activity within patient's tolerance    Home Living Family/patient expects to be discharged to:: Unsure                      Prior Function Level of Independence: Needs assistance         Comments: Pt could not give any info on PLOF- "I dont know"     Hand Dominance        Extremity/Trunk Assessment   Upper Extremity Assessment: Generalized weakness           Lower Extremity Assessment:  (bil knee contractures)      Cervical / Trunk Assessment: Kyphotic  Communication   Communication: HOH  Cognition Arousal/Alertness: Awake/alert Behavior During Therapy: Flat affect Overall Cognitive Status: History of cognitive impairments - at baseline                      General Comments      Exercises        Assessment/Plan    PT Assessment Patient needs continued PT services  PT Diagnosis Generalized weakness;Altered mental status   PT Problem List Decreased mobility;Pain;Decreased activity tolerance;Decreased balance;Decreased strength;Decreased cognition  PT Treatment Interventions Functional mobility training;Therapeutic activities;Therapeutic exercise;Patient/family education;Balance training   PT  Goals (Current goals can be found in the Care Plan section) Acute Rehab PT Goals Patient Stated Goal: none stated PT Goal Formulation: Patient unable to participate in goal setting Time For Goal Achievement: 05/11/15 Potential to Achieve Goals: Poor    Frequency Min 2X/week   Barriers to discharge        Co-evaluation               End of Session   Activity Tolerance: Patient limited by fatigue;Patient limited by pain Patient left: in bed;with call bell/phone within reach;with bed alarm set           Time: 1135-1144 PT Time Calculation (min) (ACUTE ONLY): 9  min   Charges:   PT Evaluation $Initial PT Evaluation Tier I: 1 Procedure     PT G Codes:        Rebeca Alert, MPT Pager: [phone]

## 2015-04-28 LAB — GLUCOSE, CAPILLARY
GLUCOSE-CAPILLARY: 149 mg/dL — AB (ref 65–99)
GLUCOSE-CAPILLARY: 100 mg/dL — AB (ref 65–99)
Glucose-Capillary: 86 mg/dL (ref 65–99)

## 2015-04-28 LAB — BASIC METABOLIC PANEL
Anion gap: 9 (ref 5–15)
BUN: 17 mg/dL (ref 6–20)
CHLORIDE: 103 mmol/L (ref 101–111)
CO2: 30 mmol/L (ref 22–32)
CREATININE: 0.9 mg/dL (ref 0.44–1.00)
Calcium: 9.4 mg/dL (ref 8.9–10.3)
GFR calc non Af Amer: 56 mL/min — ABNORMAL LOW (ref >60)
Glucose, Bld: 106 mg/dL — ABNORMAL HIGH (ref 65–99)
Potassium: 3.8 mmol/L (ref 3.5–5.1)
Sodium: 142 mmol/L (ref 135–145)

## 2015-04-28 LAB — CBC
HEMATOCRIT: 25.5 % — AB (ref 36.0–46.0)
HEMOGLOBIN: 7.9 g/dL — AB (ref 12.0–15.0)
MCH: 29.6 pg (ref 26.0–34.0)
MCHC: 31 g/dL (ref 30.0–36.0)
MCV: 95.5 fL (ref 78.0–100.0)
Platelets: 293 10*3/uL (ref 150–400)
RBC: 2.67 MIL/uL — AB (ref 3.87–5.11)
RDW: 19.1 % — AB (ref 11.5–15.5)
WBC: 9.3 10*3/uL (ref 4.0–10.5)

## 2015-04-28 MED ORDER — DOXYCYCLINE HYCLATE 100 MG PO TABS
100.0000 mg | ORAL_TABLET | Freq: Two times a day (BID) | ORAL | Status: DC
  Administered 2015-04-28 – 2015-04-29 (×3): 100 mg via ORAL
  Filled 2015-04-28 (×4): qty 1

## 2015-04-28 MED ORDER — CLONAZEPAM 0.5 MG PO TABS: 0.5000 mg | ORAL_TABLET | Freq: Two times a day (BID) | ORAL | Status: AC | PRN

## 2015-04-28 MED ORDER — POTASSIUM CHLORIDE CRYS ER 20 MEQ PO TBCR: 40.0000 meq | EXTENDED_RELEASE_TABLET | Freq: Every day | ORAL | Status: AC

## 2015-04-28 MED ORDER — FUROSEMIDE 40 MG PO TABS: 40.0000 mg | ORAL_TABLET | Freq: Every day | ORAL | Status: AC

## 2015-04-28 MED ORDER — FUROSEMIDE 40 MG PO TABS
40.0000 mg | ORAL_TABLET | Freq: Every day | ORAL | Status: DC
  Administered 2015-04-29: 40 mg via ORAL
  Filled 2015-04-28: qty 1

## 2015-04-28 MED ORDER — DOXYCYCLINE HYCLATE 100 MG PO TABS: 100.0000 mg | ORAL_TABLET | Freq: Two times a day (BID) | ORAL | Status: DC

## 2015-04-28 NOTE — Progress Notes (Signed)
[facility] PROGRESS NOTE  Paula [name] OQH:[id number] DOB: [birth year] DOA: 04/26/2015 PCP: [name], MD  Assessment/Plan: #1 left lower extremity cellulitis Clinical improvement. Patient is currently afebrile. WBC trending down. Change IV vancomycin to oral doxycycline to complete a seven-day course of antibiotics therapy. Keep lower extremity elevated. Follow.  #2 anemia of chronic disease Likely anemia of chronic disease. Anemia panel consistent with anemia of chronic disease. Ferritin was low at 20. Patient with no overt bleeding.  Follow H&H. Transfusion threshold hemoglobin less than 7.  #3 hypokalemia Likely secondary to diureses. Repleted.  #4 acute on chronic diastolic CHF exacerbation Improving clinically. 2-D echo with EF of 55% from 2015. I/O equal -1.29 L over the past 48 hours. Patient's current weight is 71.2 kg from 72.8 kg from 74.8 kg from 79.38 kg. Change IV Lasix to oral Lasix 40 mg daily.  Follow.  #5 well-controlled diabetes mellitus type 2 Continue to hold oral hypoglycemic agents. Hemoglobin A1c is 5.5. CBGs ranged from 86-119. Continue sliding scale insulin.  #6 generalized deconditioning/weakness Patient is wheelchair/bed bound status. Patient is legally blind. PT following. Continue current diet per speech therapy.   #7 History of TIA Continue anticoagulation.  #8 history of DVT Continue xarelto.  #9 dementia Stable.  #10 hypothyroidism TSH within normal limits. Continue home dose Synthroid.  #11 prophylaxis On xarelto for DVT prophylaxis.   Code Status: DO NOT RESUSCITATE Family Communication: No family at bedside. Disposition Plan: Back to skilled nursing facility when medically stable, hopefully tomorrow.   Consultants:  None  Procedures:  X-ray of the knee 04/26/2015  Bilateral lower extremity Doppler 04/26/2015  Antibiotics:  IV vancomycin 04/26/2015>>>>> 04/28/2015  Oral doxycycline  04/28/2015  HPI/Subjective: Patient denies any chest pain. Patient denies any shortness of breath. No abdominal pain. Patient states knee pain has improved on Tylenol.  Objective: Filed Vitals:   04/28/15 0639  BP:   Pulse:   Temp:   Resp: 18    Intake/Output Summary (Last 24 hours) at 04/28/15 1142 Last data filed at 04/28/15 1119  Gross per 24 hour  Intake    720 ml  Output    750 ml  Net    -30 ml   Filed Weights   04/26/15 1356 04/27/15 0818 04/28/15 0900  Weight: 74.8 kg (164 lb 14.5 oz) 72.8 kg (160 lb 7.9 oz) 71.2 kg (156 lb 15.5 oz)    Exam:   General:  NAD  Cardiovascular: RRR  Respiratory: CTAB anterior lung fields  Abdomen: Soft, nontender, nondistended, positive bowel sounds.   Musculoskeletal: No clubbing or cyanosis. Trace-1+ bilateral lower extremity edema left greater than right. Left lower extremity with decreased erythema, decreased warmth, some tenderness to palpation.  Data Reviewed: Basic Metabolic Panel:  Recent Labs Lab 04/26/15 1040 04/27/15 0400 04/28/15 0408  NA 138 140 142  K 4.2 3.4* 3.8  CL 106 103 103  CO2 25 30 30   GLUCOSE 154* 94 106*  BUN 10 10 17   CREATININE 0.85 0.83 0.90  CALCIUM 9.2 9.0 9.4   Liver Function Tests:  Recent Labs Lab 04/26/15 1040  AST 31  ALT 12*  ALKPHOS 49  BILITOT 0.3  PROT 6.1*  ALBUMIN 3.7   No results for input(s): LIPASE, AMYLASE in the last 168 hours. No results for input(s): AMMONIA in the last 168 hours. CBC:  Recent Labs Lab 04/26/15 1040 04/27/15 0400 04/27/15 1707 04/27/15 1716 04/28/15 0408  WBC 19.9* 8.7  --   --  9.3  NEUTROABS  14.7*  --   --   --   --   HGB 9.4* 7.7* 8.3* 8.3* 7.9*  HCT 31.4* 25.5* 26.6* 26.6* 25.5*  MCV 95.4 95.9  --   --  95.5  PLT 364 280  --   --  293   Cardiac Enzymes:  Recent Labs Lab 04/26/15 1039  TROPONINI <0.03   BNP (last 3 results)  Recent Labs  11/29/14 1805 03/05/15 0753  BNP 31.8 39.3    ProBNP (last 3  results)  Recent Labs  08/15/14 1735  PROBNP 171.4    CBG:  Recent Labs Lab 04/27/15 0753 04/27/15 1203 04/27/15 1713 04/27/15 2059 04/28/15 0819  GLUCAP 93 110* 116* 119* 86    Recent Results (from the past 240 hour(s))  Culture, blood (routine x 2)     Status: None (Preliminary result)   Collection Time: 04/26/15  1:05 PM  Result Value Ref Range Status   Specimen Description BLOOD RIGHT ANTECUBITAL  Final   Special Requests BOTTLES DRAWN AEROBIC AND ANAEROBIC 5CC  Final   Culture   Final    NO GROWTH < 24 HOURS Performed at [facility]    Report Status PENDING  Incomplete  Culture, blood (routine x 2)     Status: None (Preliminary result)   Collection Time: 04/26/15  4:10 PM  Result Value Ref Range Status   Specimen Description BLOOD LEFT ARM  Final   Special Requests BOTTLES DRAWN AEROBIC AND ANAEROBIC 10CC  Final   Culture   Final    NO GROWTH < 24 HOURS Performed at [facility]    Report Status PENDING  Incomplete     Studies: Ct Head Wo Contrast  04/26/2015   CLINICAL DATA:  Fall yesterday. Headache. Confusion. Weakness. Dementia.  EXAM: CT HEAD WITHOUT CONTRAST  TECHNIQUE: Contiguous axial images were obtained from the base of the skull through the vertex without intravenous contrast.  COMPARISON:  03/27/2015  FINDINGS: There is no evidence of intracranial hemorrhage, brain edema, or other signs of acute infarction. There is no evidence of intracranial mass lesion or mass effect. No abnormal extraaxial fluid collections are identified.  Mild diffuse cerebral atrophy and moderate to severe chronic small vessel disease are unchanged in appearance. Ventricles are stable in size. No skull abnormality identified. Near complete resolution of opacification of ethmoid and sphenoid sinuses noted since prior study.  IMPRESSION: No acute intracranial findings.  Stable cerebral atrophy and chronic small vessel disease.   Electronically Signed   By: Myles Rosenthal  M.D.   On: 04/26/2015 12:22    Scheduled Meds: . carvedilol  6.25 mg Oral BID WC  . clonazePAM  0.5 mg Oral QHS  . doxycycline  100 mg Oral Q12H  . [START ON 04/29/2015] furosemide  40 mg Oral Daily  . insulin aspart  0-5 Units Subcutaneous QHS  . insulin aspart  0-9 Units Subcutaneous TID WC  . levothyroxine  200 mcg Oral QAC breakfast  . OLANZapine  10 mg Oral Daily  . pantoprazole  40 mg Oral Daily  . rivaroxaban  20 mg Oral QAC breakfast  . rivastigmine  4.6 mg Transdermal q morning - 10a  . sodium chloride  3 mL Intravenous Q12H  . valproic acid  250 mg Oral TID   Continuous Infusions:   Principal Problem:   Cellulitis of left leg Active Problems:   Hypertension   Dementia arising in the senium and presenium   TIA (transient ischemic attack)  Diabetes mellitus type 2, controlled   DVT (deep venous thrombosis)   Hypothyroidism   Diastolic CHF, chronic   Cellulitis   Acute on chronic diastolic heart failure    Time spent: 35 minutes    Ronnie Mallette M.D. [facility] Pager [phone]. If 7PM-7AM, please contact night-coverage at [email or web address], password [facility] 04/28/2015, 11:42 AM  LOS: 2 days

## 2015-04-28 NOTE — Discharge Summary (Signed)
Physician Discharge Summary  Paula [name] WUJ:[id number] DOB: 18-Jan-1927 DOA: 04/26/2015  PCP: Paula Boyden, MD  Admit date: 04/26/2015 Discharge date: 04/29/2015  Time spent: 65 minutes  Recommendations for Outpatient Follow-up:  1. Follow-up with M.D. at skilled nursing facility. Patient will need a basic metabolic profile and a CBC done in 1 week to follow-up on electrolytes, renal function, hemoglobin respectively.  Discharge Diagnoses:  Principal Problem:   Cellulitis of left leg Active Problems:   Hypertension   Dementia arising in the senium and presenium   TIA (transient ischemic attack)   Diabetes mellitus type 2, controlled   DVT (deep venous thrombosis)   Hypothyroidism   Diastolic CHF, chronic   Cellulitis   Acute on chronic diastolic heart failure   Discharge Condition: Stable and improved  Diet recommendation: Carb modified  Filed Weights   04/27/15 0818 04/28/15 0900 04/29/15 0445  Weight: 72.8 kg (160 lb 7.9 oz) 71.2 kg (156 lb 15.5 oz) 71.7 kg (158 lb 1.1 oz)    History of present illness: Per Dr Paula [name] is a 81 y.o. female, history of dementia, legally blind, diabetes mellitus type 2, history of DVT on xarelto, TIA, chronic diastolic dysfunction EF now 50% on recent echogram in 2015, hypothyroidism, essential hypertension who lives at a nursing home and has had issues with cellulitis in the legs. Had been brought in after her husband found that her left leg was red and both her legs are more swollen than before.  She was at baseline minimally verbal, she did have a chronic left-sided facial droop according to the husband, he stated that her droop became prominent when she gets sick, in the ER patient had leukocytosis, exam revealed left leg cellulitis. Head CT was unremarkable. [facility] were called to admit the patient.  Hospital Course:  #1 left lower extremity cellulitis Clinical improvement. Patient is currently afebrile. WBC  trended down. Patient has been transitioned from IV vancomycin to oral doxycycline. Patient be discharged on 4 more days of oral doxycycline to complete a course of antibiotics therapy. Outpatient follow-up.  #2 anemia Likely anemia of chronic disease. Patient with no overt bleeding. Hemoglobin remained stable and on day of discharge was 8.8.   #3 hypokalemia Likely secondary to diureses. Repleted.  #4 acute on chronic diastolic CHF exacerbation Improving clinically. Patient currently euvolemic. 2-D echo with EF of 55% from 2015. I/O equal -1.29 L. Patient's current weight is 71.7 kg from 72.8 kg from 74.8 kg from 79.38 kg. Patient has been transitioned to oral Lasix 40 mg daily.  #5 well-controlled diabetes mellitus type 2 Patient's oral hypoglycemic agents were held during the hospitalization. Hemoglobin A1c is 5.5. CBGs ranged from 86-149. Patient was maintained on sliding scale insulin.  #6 generalized deconditioning/weakness Patient is wheelchair/bed bound status. Patient is legally blind. PT ff. Patient has been seen by speech and patient placed on a regular diet.  #7 History of TIA Continued on anticoagulation.  #8 dementia Stable.  #9 hypothyroidism TSH within normal limits. Patient was maintained on her home dose Synthroid.   Procedures:  CT head 04/26/2015  X-ray left knee 04/26/2015  Lower extremity Dopplers 04/26/2015  Consultations:  None  Discharge Exam: Filed Vitals:   04/29/15 0445  BP: 152/58  Pulse: 83  Temp: 97.5 F (36.4 C)  Resp: 22    General: NAD Cardiovascular: RRR Respiratory: CTAB  Discharge Instructions   Discharge Instructions    Diet Carb Modified    Complete by:  As directed  Discharge instructions    Complete by:  As directed   Follow up with MD at SNF     Increase activity slowly    Complete by:  As directed           Current Discharge Medication List    START taking these medications   Details  doxycycline  (VIBRA-TABS) 100 MG tablet Take 1 tablet (100 mg total) by mouth every 12 (twelve) hours. Take for 4 days then stop. Qty: 8 tablet, Refills: 0      CONTINUE these medications which have CHANGED   Details  clonazePAM (KLONOPIN) 0.5 MG tablet Take 1 tablet (0.5 mg total) by mouth every 12 (twelve) hours as needed for anxiety. Qty: 15 tablet, Refills: 0    furosemide (LASIX) 40 MG tablet Take 1 tablet (40 mg total) by mouth daily. Qty: 30 tablet, Refills: 0    potassium chloride SA (K-DUR,KLOR-CON) 20 MEQ tablet Take 2 tablets (40 mEq total) by mouth daily. Qty: 60 tablet, Refills: 0      CONTINUE these medications which have NOT CHANGED   Details  acetaminophen (TYLENOL) 500 MG tablet Take 500 mg by mouth 4 (four) times daily.     albuterol (PROVENTIL HFA;VENTOLIN HFA) 108 (90 BASE) MCG/ACT inhaler Inhale 2 puffs into the lungs every 6 (six) hours as needed for wheezing or shortness of breath. Qty: 3 Inhaler, Refills: 0    albuterol (PROVENTIL) (2.5 MG/3ML) 0.083% nebulizer solution Take 2.5 mg by nebulization every 8 (eight) hours as needed for wheezing.     Calcium Carb-Cholecalciferol (CALCIUM-VITAMIN D) 600-400 MG-UNIT TABS Take 1 tablet by mouth daily.    carvedilol (COREG) 6.25 MG tablet Take 6.25 mg by mouth 2 (two) times daily with a meal.    Cholecalciferol (VITAMIN D3) 5000 UNITS CAPS Take 5,000 Units by mouth every other day.     dextromethorphan (DELSYM) 30 MG/5ML liquid Take 10 mLs by mouth at bedtime as needed for cough.    divalproex (DEPAKOTE) 250 MG DR tablet Take 250 mg by mouth 3 (three) times daily.    fluticasone (FLONASE) 50 MCG/ACT nasal spray Place 1 spray into both nostrils daily. Qty: 16 g, Refills: 3    guaifenesin (ROBITUSSIN) 100 MG/5ML syrup Take 200 mg by mouth every 6 (six) hours as needed for cough.    levothyroxine (SYNTHROID, LEVOTHROID) 200 MCG tablet Take 200 mcg by mouth daily before breakfast.    loratadine (CLARITIN) 10 MG tablet Take 10  mg by mouth daily.    losartan (COZAAR) 50 MG tablet Take 50 mg by mouth daily.    magnesium hydroxide (MILK OF MAGNESIA) 400 MG/5ML suspension Take 30 mLs by mouth daily as needed for mild constipation.    metFORMIN (GLUCOPHAGE) 500 MG tablet Take 500 mg by mouth daily with breakfast.    Neomycin-Bacitracin-Polymyxin (TRIPLE ANTIBIOTIC EX) Apply 1 application topically 2 (two) times daily. Right Knee    OLANZapine (ZYPREXA) 10 MG tablet Take 1 tablet (10 mg total) by mouth daily. Qty: 30 tablet, Refills: 0    ondansetron (ZOFRAN) 4 MG tablet Take 1 tablet (4 mg total) by mouth every 6 (six) hours as needed for nausea. Qty: 20 tablet, Refills: 0    pantoprazole (PROTONIX) 40 MG tablet Take 40 mg by mouth daily.    Polyethyl Glycol-Propyl Glycol (SYSTANE) 0.4-0.3 % SOLN Apply 1 drop to eye 2 (two) times daily.     rivaroxaban (XARELTO) 20 MG TABS tablet Take 1 tablet (20 mg total) by mouth  daily with supper. Qty: 30 tablet, Refills: 0    rivastigmine (EXELON) 4.6 mg/24hr Place 4.6 mg onto the skin every morning.    traZODone (DESYREL) 150 MG tablet Take 1 tablet (150 mg total) by mouth at bedtime. Qty: 30 tablet, Refills: 0      STOP taking these medications     fexofenadine (ALLEGRA) 180 MG tablet      Infant Care Products (JOHNSONS BABY) SHAM      cefUROXime (CEFTIN) 250 MG tablet      clindamycin (CLEOCIN) 150 MG capsule      enoxaparin (LOVENOX) 80 MG/0.8ML injection      haloperidol (HALDOL) 0.5 MG tablet      ipratropium-albuterol (DUONEB) 0.5-2.5 (3) MG/3ML SOLN      predniSONE (DELTASONE) 20 MG tablet      warfarin (COUMADIN) 6 MG tablet        Allergies  Allergen Reactions  . Bactrim [Sulfamethoxazole-Trimethoprim] Anaphylaxis  . Contrast Media [Iodinated Diagnostic Agents] Anaphylaxis and Swelling  . Iodine Anaphylaxis  . Iohexol Anaphylaxis, Shortness Of Breath and Swelling     Desc: PT STATES SHE HAD A SEVERE REACTION TO IV CONRAST WITH THROAT  SWELLING AND SOB. SHE WAS ADMITTED TO THE HOSPITAL. SHE HAS NEVER HAD CONTRAST AGAIN.   Marland Kitchen Sulfa Antibiotics Anaphylaxis  . Aricept [Donepezil Hcl] Nausea And Vomiting  . Cymbalta [Duloxetine Hcl] Other (See Comments)    HALLUCINATIONS    . Delsym [Dextromethorphan] Other (See Comments)    Hallucination  . Guaifenesin & Derivatives Other (See Comments)    Hallucination  . Hydromet [Hydrocodone-Homatropine] Other (See Comments)    hallucinations  . Donepezil Other (See Comments)    Per mar  . Duloxetine Other (See Comments)    Per mar  . Iodine Other (See Comments)    Per mar  . Penicillins Other (See Comments)    Per mar  . Quinolones Other (See Comments)    Per mar  . Sodium Benzoate [Nutritional Supplements]     Per mar  . Sodium Benzoate [Nutritional Supplements] Other (See Comments)    Per mar  . Sulfa Antibiotics Other (See Comments)    Per mar  . Trimethoprim     From mar  . Trimethoprim Other (See Comments)    Per mar  . Avelox [Moxifloxacin Hcl In Nacl] Rash  . Ciprofloxacin Itching  . Gadolinium Derivatives Other (See Comments)    Unknown    . Penicillins Rash    Rash as a child  . Quinolones Rash   Follow-up Information    Schedule an appointment as soon as possible for a visit in 1 week to follow up.   Why:  f/u with MD at SNF       The results of significant diagnostics from this hospitalization (including imaging, microbiology, ancillary and laboratory) are listed below for reference.    Significant Diagnostic Studies: Ct Head Wo Contrast  04/26/2015   CLINICAL DATA:  Fall yesterday. Headache. Confusion. Weakness. Dementia.  EXAM: CT HEAD WITHOUT CONTRAST  TECHNIQUE: Contiguous axial images were obtained from the base of the skull through the vertex without intravenous contrast.  COMPARISON:  03/27/2015  FINDINGS: There is no evidence of intracranial hemorrhage, brain edema, or other signs of acute infarction. There is no evidence of intracranial mass  lesion or mass effect. No abnormal extraaxial fluid collections are identified.  Mild diffuse cerebral atrophy and moderate to severe chronic small vessel disease are unchanged in appearance. Ventricles are stable in size.  No skull abnormality identified. Near complete resolution of opacification of ethmoid and sphenoid sinuses noted since prior study.  IMPRESSION: No acute intracranial findings.  Stable cerebral atrophy and chronic small vessel disease.   Electronically Signed   By: Myles Rosenthal M.D.   On: 04/26/2015 12:22   Dg Knee Complete 4 Views Left  04/26/2015   CLINICAL DATA:  Fall yesterday with left knee pain and abrasions anteriorly. Initial encounter.  EXAM: LEFT KNEE - COMPLETE 4+ VIEW  COMPARISON:  03/27/2015  FINDINGS: No acute fracture or dislocation is identified. There is likely a small knee joint effusion. Mild-to-moderate joint space narrowing is again seen in the medial compartment with associated marginal osteophytosis, not significantly changed. No lytic or blastic osseous lesion is identified. Vascular calcifications are noted.  IMPRESSION: No acute osseous abnormality identified. Small knee joint effusion and medial compartment osteoarthrosis.   Electronically Signed   By: Sebastian Ache   On: 04/26/2015 11:33    Microbiology: Recent Results (from the past 240 hour(s))  Culture, blood (routine x 2)     Status: None (Preliminary result)   Collection Time: 04/26/15  1:05 PM  Result Value Ref Range Status   Specimen Description BLOOD RIGHT ANTECUBITAL  Final   Special Requests BOTTLES DRAWN AEROBIC AND ANAEROBIC 5CC  Final   Culture   Final    NO GROWTH 2 DAYS Performed at [facility]    Report Status PENDING  Incomplete  Culture, blood (routine x 2)     Status: None (Preliminary result)   Collection Time: 04/26/15  4:10 PM  Result Value Ref Range Status   Specimen Description BLOOD LEFT ARM  Final   Special Requests BOTTLES DRAWN AEROBIC AND ANAEROBIC 10CC  Final    Culture   Final    NO GROWTH 2 DAYS Performed at [facility]    Report Status PENDING  Incomplete     Labs: Basic Metabolic Panel:  Recent Labs Lab 04/26/15 1040 04/27/15 0400 04/28/15 0408 04/29/15 0825  NA 138 140 142 140  K 4.2 3.4* 3.8 3.8  CL 106 103 103 104  CO2 25 30 30 29   GLUCOSE 154* 94 106* 109*  BUN 10 10 17 17   CREATININE 0.85 0.83 0.90 0.83  CALCIUM 9.2 9.0 9.4 9.1   Liver Function Tests:  Recent Labs Lab 04/26/15 1040  AST 31  ALT 12*  ALKPHOS 49  BILITOT 0.3  PROT 6.1*  ALBUMIN 3.7   No results for input(s): LIPASE, AMYLASE in the last 168 hours. No results for input(s): AMMONIA in the last 168 hours. CBC:  Recent Labs Lab 04/26/15 1040 04/27/15 0400 04/27/15 1707 04/27/15 1716 04/28/15 0408 04/29/15 0805  WBC 19.9* 8.7  --   --  9.3 9.3  NEUTROABS 14.7*  --   --   --   --   --   HGB 9.4* 7.7* 8.3* 8.3* 7.9* 8.8*  HCT 31.4* 25.5* 26.6* 26.6* 25.5* 28.5*  MCV 95.4 95.9  --   --  95.5 97.3  PLT 364 280  --   --  293 301   Cardiac Enzymes:  Recent Labs Lab 04/26/15 1039  TROPONINI <0.03   BNP: BNP (last 3 results)  Recent Labs  11/29/14 1805 03/05/15 0753  BNP 31.8 39.3    ProBNP (last 3 results)  Recent Labs  08/15/14 1735  PROBNP 171.4    CBG:  Recent Labs Lab 04/27/15 1713 04/27/15 2059 04/28/15 0819 04/28/15 1150 04/28/15 1713  GLUCAP 116* 119* 86 149* 100*       Signed:  THOMPSON,DANIEL MD [facility] 04/29/2015, 10:25 AM

## 2015-04-28 NOTE — Progress Notes (Signed)
CSW continuing to follow.   Pt and pt spouse plan for pt to go to [facility] upon discharge for pt to have rehab prior to returning to ALF as ALF did not feel that they could continue to meet pt needs at this time. CSW has confirmed with [facility] that facility can accept pt when medically stable for discharge.  CSW spoke with MD who reports anticipation for d/c tomorrow.  CSW met with pt and pt spouse, Mortimer Fries at bedside to update.   CSW notified [facility] of anticipation of d/c tomorrow and facility confirmed that they could accept pt tomorrow.   CSW to continue to follow to provide support and assist with pt discharge to [facility] when medically ready.  Alison Murray, MSW, [facility] [phone]

## 2015-04-29 DIAGNOSIS — G459 Transient cerebral ischemic attack, unspecified: Secondary | ICD-10-CM

## 2015-04-29 DIAGNOSIS — I10 Essential (primary) hypertension: Secondary | ICD-10-CM

## 2015-04-29 DIAGNOSIS — I82409 Acute embolism and thrombosis of unspecified deep veins of unspecified lower extremity: Secondary | ICD-10-CM

## 2015-04-29 LAB — BASIC METABOLIC PANEL
Anion gap: 7 (ref 5–15)
BUN: 17 mg/dL (ref 6–20)
CALCIUM: 9.1 mg/dL (ref 8.9–10.3)
CHLORIDE: 104 mmol/L (ref 101–111)
CO2: 29 mmol/L (ref 22–32)
CREATININE: 0.83 mg/dL (ref 0.44–1.00)
GFR calc Af Amer: >60 mL/min (ref >60)
GFR calc non Af Amer: >60 mL/min (ref >60)
GLUCOSE: 109 mg/dL — AB (ref 65–99)
Potassium: 3.8 mmol/L (ref 3.5–5.1)
Sodium: 140 mmol/L (ref 135–145)

## 2015-04-29 LAB — CBC
HCT: 28.5 % — ABNORMAL LOW (ref 36.0–46.0)
Hemoglobin: 8.8 g/dL — ABNORMAL LOW (ref 12.0–15.0)
MCH: 30 pg (ref 26.0–34.0)
MCHC: 30.9 g/dL (ref 30.0–36.0)
MCV: 97.3 fL (ref 78.0–100.0)
Platelets: 301 K/uL (ref 150–400)
RBC: 2.93 MIL/uL — ABNORMAL LOW (ref 3.87–5.11)
RDW: 19.4 % — ABNORMAL HIGH (ref 11.5–15.5)
WBC: 9.3 K/uL (ref 4.0–10.5)

## 2015-04-29 MED ORDER — DOXYCYCLINE HYCLATE 100 MG PO TABS: 100.0000 mg | ORAL_TABLET | Freq: Two times a day (BID) | ORAL | Status: DC

## 2015-04-29 NOTE — Clinical Social Work Placement (Signed)
   CLINICAL SOCIAL WORK PLACEMENT  NOTE  Date:  04/29/2015  Patient Details  Name: Paula [name] MRN: [id number] Date of Birth: [birth year]  Clinical Social Work is seeking post-discharge placement for this patient at the Skilled  Nursing Facility level of care (*CSW will initial, date and re-position this form in  chart as items are completed):  No (pt husband specifically requesting [facility])   Patient/family provided with [place] Clinical Social Work Department's list of facilities offering this level of care within the geographic area requested by the patient (or if unable, by the patient's family).  No   Patient/family informed of their freedom to choose among providers that offer the needed level of care, that participate in Medicare, Medicaid or managed care program needed by the patient, have an available bed and are willing to accept the patient.  No   Patient/family informed of [facility]'s ownership interest in [facility] and [facility], as well as of the fact that they are under no obligation to receive care at these facilities.  PASRR submitted to EDS on 04/27/15     PASRR number received on 04/27/15     Existing PASRR number confirmed on       FL2 transmitted to all facilities in geographic area requested by pt/family on 04/27/15     FL2 transmitted to all facilities within larger geographic area on 04/27/15     Patient informed that his/her managed care company has contracts with or will negotiate with certain facilities, including the following:        Yes   Patient/family informed of bed offers received.  Patient chooses bed at [facility]     Physician recommends and patient chooses bed at   [facility]     Patient to be transferred to  [facility] on  .April 29, 2015  Patient to be transferred to facility by   ambulance    Patient family notified on   June 18, 16 of transfer.  Name of family member  notified:   [name]     PHYSICIAN Please sign FL2     Additional Comment:    _______________________________________________ Annetta Maw, LCSW 04/29/2015, 2:55 PM

## 2015-05-01 LAB — CULTURE, BLOOD (ROUTINE X 2)
CULTURE: NO GROWTH
Culture: NO GROWTH

## 2015-05-01 LAB — GLUCOSE, CAPILLARY
GLUCOSE-CAPILLARY: 118 mg/dL — AB (ref 65–99)
GLUCOSE-CAPILLARY: 90 mg/dL (ref 65–99)
Glucose-Capillary: 128 mg/dL — ABNORMAL HIGH (ref 65–99)
Glucose-Capillary: 95 mg/dL (ref 65–99)

## 2015-07-19 ENCOUNTER — Encounter: Payer: Self-pay | Admitting: *Deleted

## 2015-07-24 ENCOUNTER — Encounter: Payer: Self-pay | Admitting: Cardiology

## 2015-07-24 ENCOUNTER — Ambulatory Visit (INDEPENDENT_AMBULATORY_CARE_PROVIDER_SITE_OTHER): Payer: Medicare Other | Admitting: Cardiology

## 2015-07-24 VITALS — BP 122/68 | HR 79 | Ht 62.0 in | Wt 165.0 lb

## 2015-07-24 DIAGNOSIS — I5032 Chronic diastolic (congestive) heart failure: Secondary | ICD-10-CM

## 2015-07-24 NOTE — Progress Notes (Signed)
Cardiology Office Note   Date:  07/24/2015   ID:  Paula [name], DOB [birth year], MRN [id number]  PCP:  [name], MD  Cardiologist:  Willa Rough, MD   Chief Complaint  Patient presents with  . Appointment    Follow-up diastolic CHF      History of Present Illness: Paula [name] is a 81 y.o. female who presents to follow-up CHF. I saw her last in 2014. Most recently she has been hospitalized with cellulitis. An echo done during that hospitalization did show continued good systolic LV function. Her volume status is stable. She is at a nursing facility. She is not having chest pain or shortness of breath.    Past Medical History  Diagnosis Date  . Depression   . Ejection fraction     EF 40%, echo, November, 2012  / in improved, EF 60%, echo, December, 2012  . Contrast media allergy     Patient feels poorly with contrast  . Status post AAA (abdominal aortic aneurysm) repair     Surgical repair, Dr. Hart Rochester, December 27, 2011  . Pre-syncope     vasovagal w/ heart block  . Parotid mass     has refused further eval 10/2011  . Anxiety   . Facial tic     L sided spasms - Botox trial summer 2013, ?effective  . ARMD (age related macular degeneration) 06/2014    bilateral vision loss - legally blind  . AAA (abdominal aortic aneurysm)     s/p repair 12/2011  . Congestive heart failure     NL EF 10/2011 echo   . Diabetes mellitus type II, controlled   . Hypothyroidism   . Sinus bradycardia 09/19/2011    Occurring simultaneously with complete heart block   . COPD (chronic obstructive pulmonary disease)   . Facial spasm     L hemifacial-treated w/ botox xeomin -Dr Terrace Arabia  . Dementia     MRI with progressive chronic microvascular ischemia and generalized cerebral atrophy  . Lumbosacral spondylosis without myelopathy   . Legally blind 06/2014    due to ARMD  . DVT (deep venous thrombosis)   . Hypertension   . Diabetes mellitus without complication   . CHF (congestive heart  failure)   . GERD (gastroesophageal reflux disease)     Past Surgical History  Procedure Laterality Date  . Cholecystectomy    . Knee cartilage surgery Left   . Sinus surgery with instatrak    . Appendectomy    . Oophorectomy      ovarian cyst  . Cataract extraction Bilateral   . Bladder repair    . US echocardiography  10/14/11  . Abdominal aortic aneurysm repair  12/27/2011    ANEURYSM ABDOMINAL AORTIC REPAIR;  Surgeon: Josephina Gip, MD; Resection and Grafting Abdominal Aortic Aneurysm , Aorta Bi Iliac.  Marland Kitchen Pacemaker insertion  11/12  . Cardiac catheterization  11/04/11  . Temporary pacemaker insertion N/A 09/19/2011    Procedure: TEMPORARY PACEMAKER INSERTION;  Surgeon: Dolores Patty, MD;  Location: [facility] CATH LAB;  Service: Cardiovascular;  Laterality: N/A;  . Left heart catheterization with coronary angiogram N/A 11/04/2011    Procedure: LEFT HEART CATHETERIZATION WITH CORONARY ANGIOGRAM;  Surgeon: Laurey Morale, MD;  Location: [facility] CATH LAB;  Service: Cardiovascular;  Laterality: N/A;    Patient Active Problem List   Diagnosis Date Noted  . Acute on chronic diastolic heart failure   . Cellulitis of left leg 04/26/2015  . Cellulitis 04/26/2015  .  DVT (deep vein thrombosis) in pregnancy   . GI bleed 01/11/2015  . Rectal bleed 01/11/2015  . Acute blood loss anemia 01/11/2015  . DVT (deep venous thrombosis) 01/11/2015  . Leukocytosis 01/11/2015  . Cellulitis of left lower extremity 01/11/2015  . Diabetes mellitus without complication 01/11/2015  . HTN (hypertension) 01/11/2015  . Hypothyroidism 01/11/2015  . Diastolic CHF, chronic 01/11/2015  . Post-phlebitic syndrome 12/14/2014  . Carotid artery disease without cerebral infarction 12/13/2014  . DVT (deep venous thrombosis) 11/29/2014  . Cellulitis of left lower extremity 11/29/2014  . Macrocytic anemia 11/29/2014  . Diabetes mellitus type 2, controlled 11/29/2014  . Acute respiratory failure with hypoxia   . Dementia  with behavioral disturbance   . Palliative care encounter   . Acute diastolic heart failure, NYHA class 2 08/15/2014  . Acute respiratory failure 08/15/2014  . Fall 08/15/2014  . Encephalopathy 08/15/2014  . Sprain and strain of ribs 06/20/2014  . Nausea with vomiting 06/03/2014  . Other malaise and fatigue 05/02/2014  . Cough 04/25/2014  . B12 deficiency 04/25/2014  . Dyspnea 03/22/2014  . Chronic fatigue syndrome   . Chronic nausea   . TIA (transient ischemic attack) 11/06/2013  . Allergic rhinitis 10/12/2013  . Lumbago   . Dementia arising in the senium and presenium 03/08/2013  . Headache(784.0) 02/19/2013  . Chronic diastolic CHF (congestive heart failure) 02/11/2013  . Chronic asthmatic bronchitis with acute exacerbation 01/27/2013  . Hypertension   . Recurrent pneumonia   . Status post AAA (abdominal aortic aneurysm) repair   . Pre-syncope   . Depression 10/25/2011  . Anxiety 10/25/2011  . Facial spasm 10/13/2011  . Ejection fraction   . Contrast media allergy   . Hypothyroidism 09/19/2011  . Sinus bradycardia 09/19/2011  . Parotid mass 09/18/2011  . COPD (chronic obstructive pulmonary disease) with emphysema 09/15/2011  . Type 2 diabetes, controlled, with peripheral circulatory disorder 09/15/2011      Current Outpatient Prescriptions  Medication Sig Dispense Refill  . acetaminophen (TYLENOL) 500 MG tablet Take 500 mg by mouth 4 (four) times daily.     Marland Kitchen albuterol (PROVENTIL HFA;VENTOLIN HFA) 108 (90 BASE) MCG/ACT inhaler Inhale 2 puffs into the lungs every 6 (six) hours as needed for wheezing or shortness of breath. 3 Inhaler 0  . albuterol (PROVENTIL) (2.5 MG/3ML) 0.083% nebulizer solution Take 2.5 mg by nebulization every 8 (eight) hours as needed for wheezing.     Marland Kitchen BACLOFEN PO Take 5 mg by mouth every 12 (twelve) hours.    . Calcium Carb-Cholecalciferol (CALCIUM-VITAMIN D) 600-400 MG-UNIT TABS Take 1 tablet by mouth daily.    . carvedilol (COREG) 6.25 MG  tablet Take 6.25 mg by mouth 2 (two) times daily with a meal.    . Cholecalciferol (VITAMIN D3) 5000 UNITS CAPS Take 5,000 Units by mouth every other day.     . clonazePAM (KLONOPIN) 0.5 MG tablet Take 1 tablet (0.5 mg total) by mouth every 12 (twelve) hours as needed for anxiety. 15 tablet 0  . dextromethorphan (DELSYM) 30 MG/5ML liquid Take 10 mLs by mouth at bedtime as needed for cough.    . divalproex (DEPAKOTE) 250 MG DR tablet Take 250 mg by mouth 3 (three) times daily.    . fluticasone (FLONASE) 50 MCG/ACT nasal spray Place 1 spray into both nostrils daily. 16 g 3  . furosemide (LASIX) 40 MG tablet Take 1 tablet (40 mg total) by mouth daily. 30 tablet 0  . guaifenesin (ROBITUSSIN) 100 MG/5ML syrup Take  200 mg by mouth every 6 (six) hours as needed for cough.    . Levothyroxine Sodium (SYNTHROID PO) Take 225 mg by mouth every morning.    . loratadine (CLARITIN) 10 MG tablet Take 10 mg by mouth daily.    Marland Kitchen losartan (COZAAR) 50 MG tablet Take 50 mg by mouth daily.    . magnesium hydroxide (MILK OF MAGNESIA) 400 MG/5ML suspension Take 30 mLs by mouth daily as needed for mild constipation.    . metFORMIN (GLUCOPHAGE) 500 MG tablet Take 500 mg by mouth daily with breakfast.    . Neomycin-Bacitracin-Polymyxin (TRIPLE ANTIBIOTIC EX) Apply 1 application topically 2 (two) times daily. Right Knee    . OLANZapine (ZYPREXA) 7.5 MG tablet Take 7.5 mg by mouth daily.    . ondansetron (ZOFRAN) 4 MG tablet Take 1 tablet (4 mg total) by mouth every 6 (six) hours as needed for nausea. 20 tablet 0  . pantoprazole (PROTONIX) 40 MG tablet Take 40 mg by mouth daily.    Bertram Gala Glycol-Propyl Glycol (SYSTANE) 0.4-0.3 % SOLN Apply 1 drop to eye at bedtime.     . potassium chloride SA (K-DUR,KLOR-CON) 20 MEQ tablet Take 2 tablets (40 mEq total) by mouth daily. 60 tablet 0  . rivaroxaban (XARELTO) 20 MG TABS tablet Take 20 mg by mouth daily.    . rivastigmine (EXELON) 4.6 mg/24hr Place 4.6 mg onto the skin every  morning.    . traZODone (DESYREL) 150 MG tablet Take 1 tablet (150 mg total) by mouth at bedtime. 30 tablet 0   No current facility-administered medications for this visit.    Allergies:   Bactrim; Contrast media; Iodine; Iohexol; Sulfa antibiotics; Aricept; Cymbalta; Delsym; Guaifenesin & derivatives; Hydromet; Donepezil; Duloxetine; Iodine; Penicillins; Quinolones; Sodium benzoate; Sodium benzoate; Sulfa antibiotics; Trimethoprim; Trimethoprim; Avelox; Ciprofloxacin; Gadolinium derivatives; Penicillins; and Quinolones    Social History:  The patient  reports that she quit smoking about 4 years ago. Her smoking use included Cigarettes. She has a 60 pack-year smoking history. She has never used smokeless tobacco. She reports that she does not drink alcohol or use illicit drugs.   Family History:  The patient's family history includes Breast cancer in her sister; Cancer in her sister; Colon cancer in her sister; Esophageal cancer in her father; Heart disease in her mother, sister, and son; Parkinson's disease in her sister.    ROS:  Please see the history of present illness.     The history is taken from the patient and her husband. She denies fever, chills, headache, sweats, rash, change in vision, change in hearing, chest pain, cough, nausea or vomiting, urinary symptoms. All other systems are reviewed and are negative.   PHYSICAL EXAM: VS:  BP 122/68 mmHg  Pulse 79  Ht 5\' 2"  (1.575 m)  Wt 165 lb (74.844 kg)  BMI 30.17 kg/m2  SpO2 90% , Patient is in a wheelchair. She is oriented to person. Head is atraumatic. Sclera and conjunctiva are normal. There is no jugulovenous distention. Lungs reveal scattered rhonchi. Cardiac exam reveals S1 and S2. Abdomen is soft. There is 1+ peripheral edema.  EKG:   EKG is not done today.   Recent Labs: 08/15/2014: Pro B Natriuretic peptide (BNP) 171.4 03/05/2015: B Natriuretic Peptide 39.3 04/26/2015: ALT 12*; TSH 4.414 04/29/2015: BUN 17; Creatinine,  Ser 0.83; Hemoglobin 8.8*; Platelets 301; Potassium 3.8; Sodium 140    Lipid Panel    Component Value Date/Time   CHOL 153 11/07/2013 0845   TRIG 282* 11/07/2013  0845   HDL 28* 11/07/2013 0845   CHOLHDL 5.5 11/07/2013 0845   VLDL 56* 11/07/2013 0845   LDLCALC 69 11/07/2013 0845   LDLDIRECT 90.6 02/10/2013 0938      Wt Readings from Last 3 Encounters:  07/24/15 165 lb (74.844 kg)  04/29/15 158 lb 1.1 oz (71.7 kg)  01/11/15 175 lb 4.3 oz (79.5 kg)      Current medicines are reviewed       ASSESSMENT AND PLAN:

## 2015-07-24 NOTE — Patient Instructions (Signed)
Medication Instructions:  No medication changes today  Labwork: None today  Testing/Procedures: None today  Follow-Up: As needed with cardiology.

## 2015-07-24 NOTE — Assessment & Plan Note (Signed)
The patient's volume status is stable. No change in therapy at this time. The patient will not need regular cardiology follow-up. Her current problems are being managed at her nursing Center.

## 2017-06-27 ENCOUNTER — Encounter (HOSPITAL_COMMUNITY): Payer: Self-pay

## 2017-06-27 ENCOUNTER — Emergency Department (HOSPITAL_COMMUNITY)
Admission: EM | Admit: 2017-06-27 | Discharge: 2017-06-27 | Disposition: A | Discharge status: Skilled Nursing Facility | Payer: Medicare Other | Attending: Emergency Medicine | Admitting: Emergency Medicine

## 2017-06-27 ENCOUNTER — Emergency Department (HOSPITAL_COMMUNITY): Payer: Medicare Other

## 2017-06-27 DIAGNOSIS — S0003XA Contusion of scalp, initial encounter: Secondary | ICD-10-CM | POA: Diagnosis not present

## 2017-06-27 DIAGNOSIS — Y939 Activity, unspecified: Secondary | ICD-10-CM | POA: Diagnosis not present

## 2017-06-27 DIAGNOSIS — W050XXA Fall from non-moving wheelchair, initial encounter: Secondary | ICD-10-CM | POA: Insufficient documentation

## 2017-06-27 DIAGNOSIS — Y92129 Unspecified place in nursing home as the place of occurrence of the external cause: Secondary | ICD-10-CM | POA: Diagnosis not present

## 2017-06-27 DIAGNOSIS — I509 Heart failure, unspecified: Secondary | ICD-10-CM | POA: Insufficient documentation

## 2017-06-27 DIAGNOSIS — S0990XA Unspecified injury of head, initial encounter: Secondary | ICD-10-CM

## 2017-06-27 DIAGNOSIS — Z86718 Personal history of other venous thrombosis and embolism: Secondary | ICD-10-CM | POA: Insufficient documentation

## 2017-06-27 DIAGNOSIS — E119 Type 2 diabetes mellitus without complications: Secondary | ICD-10-CM | POA: Insufficient documentation

## 2017-06-27 DIAGNOSIS — Z7901 Long term (current) use of anticoagulants: Secondary | ICD-10-CM | POA: Insufficient documentation

## 2017-06-27 DIAGNOSIS — F0390 Unspecified dementia without behavioral disturbance: Secondary | ICD-10-CM | POA: Insufficient documentation

## 2017-06-27 DIAGNOSIS — J441 Chronic obstructive pulmonary disease with (acute) exacerbation: Secondary | ICD-10-CM | POA: Diagnosis not present

## 2017-06-27 DIAGNOSIS — Z7984 Long term (current) use of oral hypoglycemic drugs: Secondary | ICD-10-CM | POA: Insufficient documentation

## 2017-06-27 DIAGNOSIS — Y999 Unspecified external cause status: Secondary | ICD-10-CM | POA: Insufficient documentation

## 2017-06-27 DIAGNOSIS — Z87891 Personal history of nicotine dependence: Secondary | ICD-10-CM | POA: Diagnosis not present

## 2017-06-27 DIAGNOSIS — S0101XA Laceration without foreign body of scalp, initial encounter: Secondary | ICD-10-CM | POA: Diagnosis present

## 2017-06-27 MED ORDER — OXYCODONE-ACETAMINOPHEN 5-325 MG PO TABS
1.0000 | ORAL_TABLET | Freq: Once | ORAL | Status: AC
  Administered 2017-06-27: 1 via ORAL
  Filled 2017-06-27: qty 1

## 2017-06-27 NOTE — ED Provider Notes (Addendum)
[facility]-EMERGENCY DEPT Provider Note   CSN: [id number] Arrival date & time: 06/27/17  1440     History   Chief Complaint Chief Complaint  Patient presents with  . Fall  . Head Laceration   Level V caveat: Dementia  HPI Paula [name] is a 83 y.o. female.  HPI Patient is an 83 year old female who presents to emergency department from her nursing facility after nursing staff report that the patient fell from a wheelchair hit in the back of her head resulting in a small laceration.  No active bleeding at time of my evaluation.  Patient complains of neck pain but denies weakness of her arms.  She is noted to be moving all 4 extremities in the bed.  She has dementia and cannot provide any reasonable history   Past Medical History:  Diagnosis Date  . AAA (abdominal aortic aneurysm) (HCC)    s/p repair 12/2011  . Anxiety   . ARMD (age related macular degeneration) 06/2014   bilateral vision loss - legally blind  . CHF (congestive heart failure) (HCC)   . Congestive heart failure (HCC)    NL EF 10/2011 echo   . Contrast media allergy    Patient feels poorly with contrast  . COPD (chronic obstructive pulmonary disease) (HCC)   . Dementia    MRI with progressive chronic microvascular ischemia and generalized cerebral atrophy  . Depression   . Diabetes mellitus type II, controlled (HCC)   . Diabetes mellitus without complication (HCC)   . DVT (deep venous thrombosis) (HCC)   . Ejection fraction    EF 40%, echo, November, 2012  / in improved, EF 60%, echo, December, 2012  . Facial spasm    L hemifacial-treated w/ botox xeomin -Dr Terrace Arabia  . Facial tic    L sided spasms - Botox trial summer 2013, ?effective  . GERD (gastroesophageal reflux disease)   . Hypertension   . Hypothyroidism   . Legally blind 06/2014   due to ARMD  . Lumbosacral spondylosis without myelopathy   . Parotid mass    has refused further eval 10/2011  . Pre-syncope    vasovagal w/ heart block  . Sinus  bradycardia 09/19/2011   Occurring simultaneously with complete heart block   . Status post AAA (abdominal aortic aneurysm) repair    Surgical repair, Dr. Hart [place], December 27, 2011    Patient Active Problem List   Diagnosis Date Noted  . Acute on chronic diastolic heart failure (HCC)   . Cellulitis of left leg 04/26/2015  . Cellulitis 04/26/2015  . DVT (deep vein thrombosis) in pregnancy (HCC)   . GI bleed 01/11/2015  . Rectal bleed 01/11/2015  . Acute blood loss anemia 01/11/2015  . DVT (deep venous thrombosis) (HCC) 01/11/2015  . Leukocytosis 01/11/2015  . Cellulitis of left lower extremity 01/11/2015  . Diabetes mellitus without complication (HCC) 01/11/2015  . HTN (hypertension) 01/11/2015  . Hypothyroidism 01/11/2015  . Diastolic CHF, chronic (HCC) 01/11/2015  . Post-phlebitic syndrome 12/14/2014  . Carotid artery disease without cerebral infarction (HCC) 12/13/2014  . DVT (deep venous thrombosis) (HCC) 11/29/2014  . Cellulitis of left lower extremity 11/29/2014  . Macrocytic anemia 11/29/2014  . Diabetes mellitus type 2, controlled (HCC) 11/29/2014  . Acute respiratory failure with hypoxia (HCC)   . Dementia with behavioral disturbance   . Palliative care encounter   . Acute diastolic heart failure, NYHA class 2 (HCC) 08/15/2014  . Acute respiratory failure (HCC) 08/15/2014  . Fall 08/15/2014  .  Encephalopathy 08/15/2014  . Sprain and strain of ribs 06/20/2014  . Nausea with vomiting 06/03/2014  . Other malaise and fatigue 05/02/2014  . Cough 04/25/2014  . B12 deficiency 04/25/2014  . Dyspnea 03/22/2014  . Chronic fatigue syndrome   . Chronic nausea   . TIA (transient ischemic attack) 11/06/2013  . Allergic rhinitis 10/12/2013  . Lumbago   . Dementia arising in the senium and presenium 03/08/2013  . Headache(784.0) 02/19/2013  . Chronic diastolic CHF (congestive heart failure) (HCC) 02/11/2013  . Chronic asthmatic bronchitis with acute exacerbation (HCC)  01/27/2013  . Hypertension   . Recurrent pneumonia   . Status post AAA (abdominal aortic aneurysm) repair   . Pre-syncope   . Depression 10/25/2011  . Anxiety 10/25/2011  . Facial spasm 10/13/2011  . Ejection fraction   . Contrast media allergy   . Hypothyroidism 09/19/2011  . Sinus bradycardia 09/19/2011  . Parotid mass 09/18/2011  . COPD (chronic obstructive pulmonary disease) with emphysema (HCC) 09/15/2011  . Type 2 diabetes, controlled, with peripheral circulatory disorder (HCC) 09/15/2011    Past Surgical History:  Procedure Laterality Date  . ABDOMINAL AORTIC ANEURYSM REPAIR  12/27/2011   ANEURYSM ABDOMINAL AORTIC REPAIR;  Surgeon: Josephina Gip, MD; Resection and Grafting Abdominal Aortic Aneurysm , Aorta Bi Iliac.  . APPENDECTOMY    . BLADDER REPAIR    . CARDIAC CATHETERIZATION  11/04/11  . CATARACT EXTRACTION Bilateral   . CHOLECYSTECTOMY    . KNEE CARTILAGE SURGERY Left   . LEFT HEART CATHETERIZATION WITH CORONARY ANGIOGRAM N/A 11/04/2011   Procedure: LEFT HEART CATHETERIZATION WITH CORONARY ANGIOGRAM;  Surgeon: Laurey Morale, MD;  Location: [facility] CATH LAB;  Service: Cardiovascular;  Laterality: N/A;  . OOPHORECTOMY     ovarian cyst  . PACEMAKER INSERTION  11/12  . SINUS SURGERY WITH INSTATRAK    . TEMPORARY PACEMAKER INSERTION N/A 09/19/2011   Procedure: TEMPORARY PACEMAKER INSERTION;  Surgeon: Dolores Patty, MD;  Location: [facility] CATH LAB;  Service: Cardiovascular;  Laterality: N/A;  . US ECHOCARDIOGRAPHY  10/14/11    OB History    Gravida Para Term Preterm AB Living   0 0 0 0 0     SAB TAB Ectopic Multiple Live Births   0 0 0           Home Medications    Prior to Admission medications   Medication Sig Start Date End Date Taking? Authorizing Provider  acetaminophen (TYLENOL) 500 MG tablet Take 500 mg by mouth 4 (four) times daily.    Yes [provider]  alendronate (FOSAMAX) 70 MG tablet Take 70 mg by mouth once a week. Take with a full glass of  water on an empty stomach.   Yes [provider]  Baclofen 5 MG TABS Take 5 mg by mouth every 12 (twelve) hours.   Yes [provider]  Calcium Carb-Cholecalciferol (CALCIUM-VITAMIN D) 600-400 MG-UNIT TABS Take 1 tablet by mouth daily.   Yes [provider]  carvedilol (COREG) 6.25 MG tablet Take 6.25 mg by mouth 2 (two) times daily with a meal.   Yes [provider]  Cholecalciferol (VITAMIN D3) 5000 UNITS CAPS Take 5,000 Units by mouth every other day.    Yes [provider]  divalproex (DEPAKOTE) 250 MG DR tablet Take 250 mg by mouth 3 (three) times daily.   Yes [provider]  ferrous sulfate 325 (65 FE) MG EC tablet Take 325 mg by mouth daily.   Yes [provider]  fluticasone (FLONASE) 50 MCG/ACT nasal spray Place 1 spray into both nostrils daily. 04/15/14  Yes [name], MD  furosemide (LASIX) 40 MG tablet Take 1 tablet (40 mg total) by mouth daily. 04/28/15  Yes Rodolph Bong, MD  guaifenesin (ROBITUSSIN) 100 MG/5ML syrup Take 200 mg by mouth every 6 (six) hours as needed for cough.   Yes [provider]  levothyroxine (SYNTHROID) 150 MCG tablet Take 150 mg by mouth every morning.    Yes [provider]  loratadine (CLARITIN) 10 MG tablet Take 10 mg by mouth daily.   Yes [provider]  losartan (COZAAR) 50 MG tablet Take 50 mg by mouth daily.   Yes [provider]  metFORMIN (GLUCOPHAGE) 500 MG tablet Take 500 mg by mouth daily with breakfast.   Yes [provider]  Multiple Vitamin (MULTIVITAMIN WITH MINERALS) TABS tablet Take 1 tablet by mouth daily.   Yes [provider]  omeprazole (PRILOSEC) 20 MG capsule Take 20 mg by mouth daily.   Yes [provider]  Polyethyl Glycol-Propyl Glycol (SYSTANE) 0.4-0.3 % SOLN Place 1 drop into both eyes at bedtime.    Yes [provider]  potassium chloride SA (K-DUR,KLOR-CON) 20 MEQ tablet Take 2 tablets  (40 mEq total) by mouth daily. 04/28/15  Yes Rodolph Bong, MD  Rivaroxaban (XARELTO) 15 MG TABS tablet Take 15 mg by mouth daily.    Yes [provider]  rivastigmine (EXELON) 4.6 mg/24hr Place 4.6 mg onto the skin every morning.   Yes [provider]  traZODone (DESYREL) 150 MG tablet Take 1 tablet (150 mg total) by mouth at bedtime. Patient taking differently: Take 50 mg by mouth at bedtime.  01/13/15  Yes Alison Murray, MD  albuterol (PROVENTIL HFA;VENTOLIN HFA) 108 (90 BASE) MCG/ACT inhaler Inhale 2 puffs into the lungs every 6 (six) hours as needed for wheezing or shortness of breath. Patient not taking: Reported on 06/27/2017 03/24/14   Newt Lukes, MD  clonazePAM (KLONOPIN) 0.5 MG tablet Take 1 tablet (0.5 mg total) by mouth every 12 (twelve) hours as needed for anxiety. Patient not taking: Reported on 06/27/2017 04/28/15   Rodolph Bong, MD  ondansetron (ZOFRAN) 4 MG tablet Take 1 tablet (4 mg total) by mouth every 6 (six) hours as needed for nausea. Patient not taking: Reported on 06/27/2017 01/13/15   Alison Murray, MD    Family History Family History  Problem Relation Age of Onset  . Esophageal cancer Father   . Heart disease Mother   . Breast cancer Sister   . Cancer Sister   . Colon cancer Sister   . Heart disease Sister   . Heart disease Son   . Parkinson's disease Sister        younger    Social History Social History  Substance Use Topics  . Smoking status: Former Smoker    Packs/day: 1.00    Years: 60.00    Types: Cigarettes    Quit date: 01/11/2011  . Smokeless tobacco: Never Used  . Alcohol use No     Allergies   Bactrim [sulfamethoxazole-trimethoprim]; Contrast media [iodinated diagnostic agents]; Iodine; Iohexol; Sulfa antibiotics; Aricept [donepezil hcl]; Cymbalta [duloxetine hcl]; Delsym [dextromethorphan]; Guaifenesin & derivatives; Hydromet [hydrocodone-homatropine]; Donepezil; Duloxetine; Iodine; Penicillins; Quinolones;  Sodium benzoate [nutritional supplements]; Sodium benzoate [nutritional supplements]; Sulfa antibiotics; Trimethoprim; Trimethoprim; Avelox [moxifloxacin hcl in nacl]; Ciprofloxacin; Gadolinium derivatives; Penicillins; and Quinolones   Review of Systems Review of Systems  Unable to perform  ROS: Dementia     Physical Exam Updated Vital Signs BP (!) 142/60   Pulse 78   Temp 97.9 F (36.6 C) (Oral)   Resp 16   Ht 5\' 5"  (1.651 m)   Wt 74.8 kg (165 lb)   SpO2 100%   BMI 27.46 kg/m   Physical Exam  Constitutional: She is oriented to person, place, and time. She appears well-developed and well-nourished.  HENT:  Head: Normocephalic.  Posterior scalp hematoma with abrasion and no laceration.  Eyes: EOM are normal.  Neck: Normal range of motion. Neck supple.  Cervical and paracervical tenderness without cervical step-off.  Cardiovascular: Normal rate.   Pulmonary/Chest: Effort normal. She exhibits no tenderness.  Abdominal: She exhibits no distension.  Musculoskeletal: Normal range of motion.  Full range of motion of bilateral shoulders, elbows and wrists. Full range of motion of bilateral hips, knees and ankles.    Neurological: She is alert and oriented to person, place, and time.  Psychiatric: She has a normal mood and affect.  Nursing note and vitals reviewed.    ED Treatments / Results  Labs (all labs ordered are listed, but only abnormal results are displayed) Labs Reviewed - No data to display  EKG  EKG Interpretation None       Radiology Ct Head Wo Contrast  Result Date: 06/27/2017 CLINICAL DATA:  Larey Seat from wheelchair striking the back of the head. Occipital laceration. EXAM: CT HEAD WITHOUT CONTRAST CT CERVICAL SPINE WITHOUT CONTRAST TECHNIQUE: Multidetector CT imaging of the head and cervical spine was performed following the standard protocol without intravenous contrast. Multiplanar CT image reconstructions of the cervical spine were also generated.  COMPARISON:  04/26/2015 FINDINGS: CT HEAD FINDINGS Brain: Generalized atrophy. Chronic small-vessel ischemic changes of the cerebral hemispheric white matter. No sign of acute infarction, mass lesion, hemorrhage, hydrocephalus or extra-axial collection. Vascular: There is atherosclerotic calcification of the major vessels at the base of the brain. Skull: Normal.  No skull fracture. Sinuses/Orbits: Clear/normal Other: Left occipital scalp injury. CT CERVICAL SPINE FINDINGS Alignment: Some motion degradation. No traumatic malalignment. Straightening of the normal cervical lordosis. 2 mm degenerative anterolisthesis C4-5. Skull base and vertebrae: No fracture in the cervical region. Soft tissues and spinal canal: No significant soft tissue lesion. Disc levels: Osteoarthritis at C1-2. Facet osteoarthritis on the right at C2-3, C3-4, C4-5 and C5-6 and on the left at C2-3, C3-4 and C4-5. Degenerative spondylosis C4-5, C5-6 and C6-7. Upper chest: Negative except for pleural and parenchymal scarring at the apices. Other: None significant IMPRESSION: Head CT: Atrophy and chronic small-vessel ischemic change. Left occipital scalp injury. No skull fracture or intracranial injury. Cervical spine CT: No acute or traumatic finding. Chronic degenerative changes as outlined above. Electronically Signed   By: Paulina Fusi M.D.   On: 06/27/2017 16:31   Ct Cervical Spine Wo Contrast  Result Date: 06/27/2017 CLINICAL DATA:  Larey Seat from wheelchair striking the back of the head. Occipital laceration. EXAM: CT HEAD WITHOUT CONTRAST CT CERVICAL SPINE WITHOUT CONTRAST TECHNIQUE: Multidetector CT imaging of the head and cervical spine was performed following the standard protocol without intravenous contrast. Multiplanar CT image reconstructions of the cervical spine were also generated. COMPARISON:  04/26/2015 FINDINGS: CT HEAD FINDINGS Brain: Generalized atrophy. Chronic small-vessel ischemic changes of the cerebral hemispheric white  matter. No sign of acute infarction, mass lesion, hemorrhage, hydrocephalus or extra-axial collection. Vascular: There is atherosclerotic calcification of the major vessels at the base of the brain. Skull: Normal.  No skull fracture. Sinuses/Orbits:  Clear/normal Other: Left occipital scalp injury. CT CERVICAL SPINE FINDINGS Alignment: Some motion degradation. No traumatic malalignment. Straightening of the normal cervical lordosis. 2 mm degenerative anterolisthesis C4-5. Skull base and vertebrae: No fracture in the cervical region. Soft tissues and spinal canal: No significant soft tissue lesion. Disc levels: Osteoarthritis at C1-2. Facet osteoarthritis on the right at C2-3, C3-4, C4-5 and C5-6 and on the left at C2-3, C3-4 and C4-5. Degenerative spondylosis C4-5, C5-6 and C6-7. Upper chest: Negative except for pleural and parenchymal scarring at the apices. Other: None significant IMPRESSION: Head CT: Atrophy and chronic small-vessel ischemic change. Left occipital scalp injury. No skull fracture or intracranial injury. Cervical spine CT: No acute or traumatic finding. Chronic degenerative changes as outlined above. Electronically Signed   By: Paulina Fusi M.D.   On: 06/27/2017 16:31    Procedures Procedures (including critical care time)  Medications Ordered in ED Medications  oxyCODONE-acetaminophen (PERCOCET/ROXICET) 5-325 MG per tablet 1 tablet (1 tablet Oral Given 06/27/17 1624)     Initial Impression / Assessment and Plan / ED Course  I have reviewed the triage vital signs and the nursing notes.  Pertinent labs & imaging results that were available during my care of the patient were reviewed by me and considered in my medical decision making (see chart for details).     Scalp hematoma with small abrasion.  No laceration repair.  CT head C-spine without acute abnormality.  Discharge home in good condition.  Primary care follow-up.  Patient understands return to the ER for new or worsening  symptoms  Final Clinical Impressions(s) / ED Diagnoses   Final diagnoses:  Closed head injury, initial encounter  Scalp hematoma, initial encounter    New Prescriptions New Prescriptions   No medications on file     Azalia Bilis, MD 06/27/17 1646    Azalia Bilis, MD 07/18/17 [phone]

## 2017-06-27 NOTE — ED Notes (Signed)
Bed: [facility] Expected date:  Expected time:  Means of arrival:  Comments: 83 yo fall

## 2017-06-27 NOTE — ED Notes (Signed)
Patient shouting out over and over again "I want an ambulance to screw me". Patient also states to writer she is "fxxxing ready for the ambulance to get here". Writer explained to patient the ambulance [facility]) had been called to transport her back to her facility. Patient continually shouting that she "wants the ambulance to screw her". RN aware.

## 2017-06-27 NOTE — ED Triage Notes (Signed)
Per EMS- patient is a resident of [facility]. Staff reported that the patient  Larey Seat from a wheelchair hitting the back of her head. Patient has a 1-1/2 inch laceration to the left occipital area.Patient c/o neck and back pain. EMS placed a c-collar but the patient kept pulling the collar off.

## 2017-06-27 NOTE — ED Notes (Signed)
[facility] notified of need for transportation back to [facility].

## 2019-03-12 DEATH — deceased
